# Patient Record
Sex: Female | Born: 1945
Health system: Southern US, Community
[De-identification: ages and names within clinical notes are randomized; demographics above are authoritative.]

## PROBLEM LIST (undated history)

## (undated) DIAGNOSIS — Z8719 Personal history of other diseases of the digestive system: Secondary | ICD-10-CM

## (undated) DIAGNOSIS — R202 Paresthesia of skin: Secondary | ICD-10-CM

## (undated) DIAGNOSIS — Z87442 Personal history of urinary calculi: Secondary | ICD-10-CM

## (undated) DIAGNOSIS — I10 Essential (primary) hypertension: Secondary | ICD-10-CM

## (undated) DIAGNOSIS — M0520 Rheumatoid vasculitis with rheumatoid arthritis of unspecified site: Secondary | ICD-10-CM

## (undated) DIAGNOSIS — D649 Anemia, unspecified: Secondary | ICD-10-CM

## (undated) DIAGNOSIS — G4700 Insomnia, unspecified: Secondary | ICD-10-CM

## (undated) DIAGNOSIS — H9319 Tinnitus, unspecified ear: Secondary | ICD-10-CM

## (undated) DIAGNOSIS — Z0000 Encounter for general adult medical examination without abnormal findings: Secondary | ICD-10-CM

## (undated) DIAGNOSIS — R32 Unspecified urinary incontinence: Secondary | ICD-10-CM

## (undated) DIAGNOSIS — R079 Chest pain, unspecified: Secondary | ICD-10-CM

## (undated) DIAGNOSIS — E079 Disorder of thyroid, unspecified: Secondary | ICD-10-CM

## (undated) DIAGNOSIS — Z9889 Other specified postprocedural states: Secondary | ICD-10-CM

## (undated) DIAGNOSIS — M25569 Pain in unspecified knee: Secondary | ICD-10-CM

## (undated) DIAGNOSIS — R112 Nausea with vomiting, unspecified: Secondary | ICD-10-CM

## (undated) DIAGNOSIS — E039 Hypothyroidism, unspecified: Secondary | ICD-10-CM

## (undated) DIAGNOSIS — R413 Other amnesia: Secondary | ICD-10-CM

## (undated) DIAGNOSIS — B059 Measles without complication: Secondary | ICD-10-CM

## (undated) DIAGNOSIS — J45909 Unspecified asthma, uncomplicated: Secondary | ICD-10-CM

## (undated) DIAGNOSIS — C73 Malignant neoplasm of thyroid gland: Secondary | ICD-10-CM

## (undated) DIAGNOSIS — B019 Varicella without complication: Secondary | ICD-10-CM

## (undated) DIAGNOSIS — G4733 Obstructive sleep apnea (adult) (pediatric): Secondary | ICD-10-CM

## (undated) DIAGNOSIS — T4145XA Adverse effect of unspecified anesthetic, initial encounter: Secondary | ICD-10-CM

## (undated) DIAGNOSIS — I209 Angina pectoris, unspecified: Secondary | ICD-10-CM

## (undated) DIAGNOSIS — E785 Hyperlipidemia, unspecified: Secondary | ICD-10-CM

## (undated) DIAGNOSIS — M7989 Other specified soft tissue disorders: Secondary | ICD-10-CM

## (undated) DIAGNOSIS — F419 Anxiety disorder, unspecified: Secondary | ICD-10-CM

## (undated) DIAGNOSIS — K5900 Constipation, unspecified: Secondary | ICD-10-CM

## (undated) DIAGNOSIS — L309 Dermatitis, unspecified: Secondary | ICD-10-CM

## (undated) DIAGNOSIS — K219 Gastro-esophageal reflux disease without esophagitis: Secondary | ICD-10-CM

## (undated) DIAGNOSIS — N979 Female infertility, unspecified: Secondary | ICD-10-CM

## (undated) DIAGNOSIS — R87619 Unspecified abnormal cytological findings in specimens from cervix uteri: Secondary | ICD-10-CM

## (undated) DIAGNOSIS — M1990 Unspecified osteoarthritis, unspecified site: Secondary | ICD-10-CM

## (undated) DIAGNOSIS — IMO0001 Reserved for inherently not codable concepts without codable children: Secondary | ICD-10-CM

## (undated) DIAGNOSIS — M2550 Pain in unspecified joint: Secondary | ICD-10-CM

## (undated) DIAGNOSIS — M545 Low back pain: Secondary | ICD-10-CM

## (undated) DIAGNOSIS — N200 Calculus of kidney: Secondary | ICD-10-CM

## (undated) DIAGNOSIS — R350 Frequency of micturition: Secondary | ICD-10-CM

## (undated) DIAGNOSIS — G4730 Sleep apnea, unspecified: Secondary | ICD-10-CM

## (undated) DIAGNOSIS — R002 Palpitations: Secondary | ICD-10-CM

## (undated) DIAGNOSIS — K644 Residual hemorrhoidal skin tags: Secondary | ICD-10-CM

## (undated) DIAGNOSIS — R682 Dry mouth, unspecified: Secondary | ICD-10-CM

## (undated) DIAGNOSIS — H9190 Unspecified hearing loss, unspecified ear: Secondary | ICD-10-CM

## (undated) DIAGNOSIS — N390 Urinary tract infection, site not specified: Secondary | ICD-10-CM

## (undated) DIAGNOSIS — R631 Polydipsia: Secondary | ICD-10-CM

## (undated) DIAGNOSIS — J3489 Other specified disorders of nose and nasal sinuses: Secondary | ICD-10-CM

## (undated) DIAGNOSIS — R011 Cardiac murmur, unspecified: Secondary | ICD-10-CM

## (undated) DIAGNOSIS — K5792 Diverticulitis of intestine, part unspecified, without perforation or abscess without bleeding: Secondary | ICD-10-CM

## (undated) DIAGNOSIS — E669 Obesity, unspecified: Secondary | ICD-10-CM

## (undated) DIAGNOSIS — M25512 Pain in left shoulder: Secondary | ICD-10-CM

## (undated) DIAGNOSIS — Z9989 Dependence on other enabling machines and devices: Secondary | ICD-10-CM

## (undated) HISTORY — DX: Unspecified asthma, uncomplicated: J45.909

## (undated) HISTORY — DX: Tinnitus, unspecified ear: H93.19

## (undated) HISTORY — DX: Pain in left shoulder: M25.512

## (undated) HISTORY — DX: Urinary tract infection, site not specified: N39.0

## (undated) HISTORY — DX: Gastro-esophageal reflux disease without esophagitis: K21.9

## (undated) HISTORY — DX: Encounter for general adult medical examination without abnormal findings: Z00.00

## (undated) HISTORY — DX: Dry mouth, unspecified: R68.2

## (undated) HISTORY — PX: COLON SURGERY: SHX602

## (undated) HISTORY — DX: Polydipsia: R63.1

## (undated) HISTORY — DX: Unspecified hearing loss, unspecified ear: H91.90

## (undated) HISTORY — PX: TONSILLECTOMY: SUR1361

## (undated) HISTORY — DX: Palpitations: R00.2

## (undated) HISTORY — DX: Disorder of thyroid, unspecified: E07.9

## (undated) HISTORY — DX: Unspecified abnormal cytological findings in specimens from cervix uteri: R87.619

## (undated) HISTORY — DX: Unspecified osteoarthritis, unspecified site: M19.90

## (undated) HISTORY — DX: Rheumatoid vasculitis with rheumatoid arthritis of unspecified site: M05.20

## (undated) HISTORY — DX: Paresthesia of skin: R20.2

## (undated) HISTORY — DX: Essential (primary) hypertension: I10

## (undated) HISTORY — DX: Other amnesia: R41.3

## (undated) HISTORY — DX: Other specified disorders of nose and nasal sinuses: J34.89

## (undated) HISTORY — DX: Hyperlipidemia, unspecified: E78.5

## (undated) HISTORY — DX: Measles without complication: B05.9

## (undated) HISTORY — DX: Dermatitis, unspecified: L30.9

## (undated) HISTORY — DX: Constipation, unspecified: K59.00

## (undated) HISTORY — DX: Cardiac murmur, unspecified: R01.1

## (undated) HISTORY — DX: Pain in unspecified joint: M25.50

## (undated) HISTORY — DX: Insomnia, unspecified: G47.00

## (undated) HISTORY — DX: Other specified soft tissue disorders: M79.89

## (undated) HISTORY — DX: Obesity, unspecified: E66.9

## (undated) HISTORY — DX: Female infertility, unspecified: N97.9

## (undated) HISTORY — DX: Frequency of micturition: R35.0

## (undated) HISTORY — DX: Anemia, unspecified: D64.9

## (undated) HISTORY — DX: Low back pain: M54.5

## (undated) HISTORY — DX: Pain in unspecified knee: M25.569

## (undated) HISTORY — DX: Varicella without complication: B01.9

## (undated) HISTORY — DX: Chest pain, unspecified: R07.9

## (undated) HISTORY — DX: Sleep apnea, unspecified: G47.30

## (undated) HISTORY — DX: Diverticulitis of intestine, part unspecified, without perforation or abscess without bleeding: K57.92

## (undated) SURGERY — Surgical Case
Anesthesia: *Unknown

---
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
## 1969-08-13 DIAGNOSIS — N200 Calculus of kidney: Secondary | ICD-10-CM

## 1969-08-13 HISTORY — DX: Calculus of kidney: N20.0

## 1969-08-13 HISTORY — PX: VAGINAL HYSTERECTOMY: SUR661

## 1979-08-14 DIAGNOSIS — C73 Malignant neoplasm of thyroid gland: Secondary | ICD-10-CM

## 1979-08-14 HISTORY — DX: Malignant neoplasm of thyroid gland: C73

## 1986-12-13 HISTORY — PX: THYROIDECTOMY, PARTIAL: SHX18

## 1988-12-13 HISTORY — PX: CHOLECYSTECTOMY: SHX55

## 2001-04-17 ENCOUNTER — Other Ambulatory Visit: Admission: RE | Admit: 2001-04-17 | Discharge: 2001-04-17 | Payer: Self-pay | Admitting: *Deleted

## 2002-10-18 ENCOUNTER — Encounter: Admission: RE | Admit: 2002-10-18 | Discharge: 2002-10-18 | Payer: Self-pay | Admitting: Family Medicine

## 2002-10-18 ENCOUNTER — Encounter: Payer: Self-pay | Admitting: Family Medicine

## 2002-12-13 LAB — HM COLONOSCOPY: HM Colonoscopy: NORMAL

## 2003-11-30 ENCOUNTER — Emergency Department (HOSPITAL_COMMUNITY): Admission: EM | Admit: 2003-11-30 | Discharge: 2003-12-01 | Payer: Self-pay | Admitting: Emergency Medicine

## 2005-04-05 ENCOUNTER — Ambulatory Visit (HOSPITAL_COMMUNITY): Admission: RE | Admit: 2005-04-05 | Discharge: 2005-04-05 | Payer: Self-pay | Admitting: Gastroenterology

## 2008-01-01 ENCOUNTER — Encounter: Admission: RE | Admit: 2008-01-01 | Discharge: 2008-01-01 | Payer: Self-pay | Admitting: Orthopedic Surgery

## 2008-02-08 ENCOUNTER — Ambulatory Visit (HOSPITAL_BASED_OUTPATIENT_CLINIC_OR_DEPARTMENT_OTHER): Admission: RE | Admit: 2008-02-08 | Discharge: 2008-02-08 | Payer: Self-pay | Admitting: Orthopedic Surgery

## 2010-06-29 ENCOUNTER — Encounter: Payer: Self-pay | Admitting: Internal Medicine

## 2010-07-15 ENCOUNTER — Encounter: Payer: Self-pay | Admitting: Internal Medicine

## 2010-08-25 ENCOUNTER — Ambulatory Visit (HOSPITAL_BASED_OUTPATIENT_CLINIC_OR_DEPARTMENT_OTHER): Admission: RE | Admit: 2010-08-25 | Discharge: 2010-08-25 | Payer: Self-pay | Admitting: Family Medicine

## 2010-08-25 ENCOUNTER — Encounter: Payer: Self-pay | Admitting: Internal Medicine

## 2010-08-30 ENCOUNTER — Ambulatory Visit: Payer: Self-pay | Admitting: Internal Medicine

## 2010-11-04 ENCOUNTER — Ambulatory Visit: Payer: Self-pay | Admitting: Internal Medicine

## 2010-11-04 DIAGNOSIS — G4733 Obstructive sleep apnea (adult) (pediatric): Secondary | ICD-10-CM | POA: Insufficient documentation

## 2010-11-04 DIAGNOSIS — J309 Allergic rhinitis, unspecified: Secondary | ICD-10-CM | POA: Insufficient documentation

## 2010-11-04 HISTORY — DX: Obstructive sleep apnea (adult) (pediatric): G47.33

## 2010-11-04 HISTORY — DX: Allergic rhinitis, unspecified: J30.9

## 2010-11-08 DIAGNOSIS — E782 Mixed hyperlipidemia: Secondary | ICD-10-CM | POA: Insufficient documentation

## 2010-11-08 DIAGNOSIS — I10 Essential (primary) hypertension: Secondary | ICD-10-CM | POA: Insufficient documentation

## 2010-11-08 DIAGNOSIS — K219 Gastro-esophageal reflux disease without esophagitis: Secondary | ICD-10-CM | POA: Insufficient documentation

## 2010-11-08 HISTORY — DX: Essential (primary) hypertension: I10

## 2010-11-16 ENCOUNTER — Telehealth (INDEPENDENT_AMBULATORY_CARE_PROVIDER_SITE_OTHER): Payer: Self-pay | Admitting: *Deleted

## 2010-11-19 ENCOUNTER — Encounter: Payer: Self-pay | Admitting: Internal Medicine

## 2010-12-02 ENCOUNTER — Telehealth: Payer: Self-pay | Admitting: Internal Medicine

## 2010-12-11 ENCOUNTER — Ambulatory Visit: Payer: Self-pay | Admitting: Internal Medicine

## 2010-12-17 ENCOUNTER — Encounter: Payer: Self-pay | Admitting: Internal Medicine

## 2010-12-18 ENCOUNTER — Telehealth (INDEPENDENT_AMBULATORY_CARE_PROVIDER_SITE_OTHER): Payer: Self-pay | Admitting: *Deleted

## 2011-01-05 ENCOUNTER — Encounter: Payer: Self-pay | Admitting: Internal Medicine

## 2011-01-14 NOTE — Letter (Signed)
Summary: Jackie Stevens at [facility] at [facility]   Imported By: Lester [place] 11/20/2010 10:44:21  _____________________________________________________________________  External Attachment:    Type:   Image     Comment:   External Document

## 2011-01-14 NOTE — Assessment & Plan Note (Signed)
Summary: sleep apnea need cpap machine/jd   Primary Provider/Referring Provider:  Blair Heys  CC:  Sleep New Patient.  History of Present Illness: November 04, 2010- 64 yoF referred by Dr Manus Gunning for sleep evaluation. She complains that her snoring wakes her up. Her husband Deniece Portela is our pateient with sleep apnea, on CPAP, so she is aware of these issues. She wakes 2-3 x/ night, w/ nocturia x 1. Some daytime sleepiness. Husband wore ear plugs for awhile, but they disturb each other and are now in separate rooms.  Bedtime 1030-1200 MN, latency 10 minutes, up 530- 630 AM.  [facility] NPSGat [facility] 07/15/10- AHI 13.5/hr. CPAP titration at [facility] facility 08/25/10- CPAP 10> AHI 0/hr. Hx GERD and cough wakes her if she forgets Reglan. Past hx asthma and now rhinitis w/ much sinus drip, ears itch. She denies dyspnea lying awake in bed.  Hx Thyroid cancder, partial thyroidectomy, RAI.   Preventive Screening-Counseling & Management  Alcohol-Tobacco     Smoking Status: never  Current Medications (verified): 1)  Diovan Hct 160-12.5 Mg Tabs (Valsartan-Hydrochlorothiazide) .... Take 1 By Mouth Once Daily 2)  Simvastatin 40 Mg Tabs (Simvastatin) .... Take 1 By Mouth Once Daily 3)  Synthroid 150 Mcg Tabs (Levothyroxine Sodium) .... Take 1 By Mouth Once Daily 4)  Etodolac 400 Mg Tabs (Etodolac) .... Take 2 By Mouth Once Daily 5)  Vitamin D3 1000 Unit Tabs (Cholecalciferol) .... Take 1 By Mouth Once Daily 6)  Vitamin E 400 Unit Caps (Vitamin E) .... Take 1 By Mouth Once Daily 7)  Metoclopramide Hcl 10 Mg Tabs (Metoclopramide Hcl) .... Take 1 By Mouth At Each Meal and Bedtime 8)  Calcium Carbonate 600 Mg Tabs (Calcium Carbonate) .... Take 2 By Mouth Once Daily 9)  Fish Oil 1200 Mg Caps (Omega-3 Fatty Acids) .... Take 1 By Mouth Once Daily 10)  Glucosamine 1500 Complex  Caps (Glucosamine-Chondroit-Vit C-Mn) .... Take 1 By Mouth Once Daily 11)  Vitamin B-12 1000 Mcg Tabs (Cyanocobalamin) .... Taek 1 By  Mouth Once Daily 12)  Metamucil Plus Calcium  Caps (Psyllium-Calcium) .... Take 1 By Mouth Once Daily  Allergies (verified): 1)  ! Neosporin  Past History:  Family History: Last updated: 11/04/2010 Heart DX: mother and father Cancer: aunts on fathers side. Mother- died Alzheimer's, heart disease Father- died pneumonia age 30  Social History: Last updated: 11/04/2010 Married with children Retired Never smoked ETOH-2 glasses a week  Risk Factors: Smoking Status: never (11/04/2010)  Past Medical History: Allergic Rhinitis Hypertension Obstructive sleep apnea- NPSG 07/15/10- AHI 13.5/ hr @ [facility]. CPAP. G E R D Hx thyroid cancer- partial thyroidectomy, RAI Hyperlipidemia  Past Surgical History: Partial thyroidectomy Tonsillectomy Cholecystectomy Total Abdominal Hysterectomy  Family History: Heart DX: mother and father Cancer: aunts on fathers side. Mother- died Alzheimer's, heart disease Father- died pneumonia age 12  Social History: Married with children Retired Never smoked ETOH-2 glasses a weekSmoking Status:  never  Review of Systems       The patient complains of shortness of breath with activity, non-productive cough, itching, depression, and joint stiffness or pain.  The patient denies shortness of breath at rest, productive cough, coughing up blood, chest pain, irregular heartbeats, acid heartburn, indigestion, loss of appetite, weight change, abdominal pain, difficulty swallowing, sore throat, tooth/dental problems, headaches, nasal congestion/difficulty breathing through nose, sneezing, ear ache, anxiety, hand/feet swelling, rash, change in color of mucus, and fever.    Vital Signs:  Patient profile:   65 year old female Height:  63 inches Weight:      217 pounds BMI:     38.58 O2 Sat:      96 % on Room air Pulse rate:   75 / minute BP sitting:   122 / 78  (left arm) Cuff size:   large  Vitals Entered By: Reynaldo Minium CMA (November 04, 2010  10:42 AM)  O2 Flow:  Room air  Physical Exam  Additional Exam:  General: A/Ox3; pleasant and cooperative, NAD,overweight SKIN: no rash, lesions NODES: no lymphadenopathy HEENT: [place]/AT, EOM- WNL, Conjuctivae- clear, PERRLA, TM- irritated/ abrasion Right ear, Nose- clear, Throat- clear and wnl, Mallampati  IV NECK: Supple w/ fair ROM, JVD- none,  Thyroid-  no mass. Carotid artery- short bruit right  CHEST: Clear to P&A HEART: RRR, no m/g/r heard ABDOMEN: Soft and nl; nml bowel sounds; no organomegaly or masses noted ZOX:WRUE, nl pulses, no edema  NEURO: Grossly intact to observation      Impression & Recommendations:  Problem # 1:  OBSTRUCTIVE SLEEP APNEA (ICD-327.23)  Mild  sleep apnea with excellent initial response to CPAP at 10. We carefully discussed the medical concerns and available treatments. Emphasized the importance of weight, and her responsibility to drive safely.  Problem # 2:  ALLERGIC RHINITIS (ICD-477.9)  She has substantial itching in ear canals and rhintis. We will have her try Nasonex intially but she may need to suplement with an antihistamine.   Her updated medication list for this problem includes:    Nasonex 50 Mcg/act Susp (Mometasone furoate) .Marland Kitchen... 1-2 puffs each nostril every night at bedtime  Medications Added to Medication List This Visit: 1)  Diovan Hct 160-12.5 Mg Tabs (Valsartan-hydrochlorothiazide) .... Take 1 by mouth once daily 2)  Simvastatin 40 Mg Tabs (Simvastatin) .... Take 1 by mouth once daily 3)  Synthroid 150 Mcg Tabs (Levothyroxine sodium) .... Take 1 by mouth once daily 4)  Etodolac 400 Mg Tabs (Etodolac) .... Take 2 by mouth once daily 5)  Vitamin D3 1000 Unit Tabs (Cholecalciferol) .... Take 1 by mouth once daily 6)  Vitamin E 400 Unit Caps (Vitamin e) .... Take 1 by mouth once daily 7)  Metoclopramide Hcl 10 Mg Tabs (Metoclopramide hcl) .... Take 1 by mouth at each meal and bedtime 8)  Calcium Carbonate 600 Mg Tabs (Calcium  carbonate) .... Take 2 by mouth once daily 9)  Fish Oil 1200 Mg Caps (Omega-3 fatty acids) .... Take 1 by mouth once daily 10)  Glucosamine 1500 Complex Caps (Glucosamine-chondroit-vit c-mn) .... Take 1 by mouth once daily 11)  Vitamin B-12 1000 Mcg Tabs (Cyanocobalamin) .... Taek 1 by mouth once daily 12)  Metamucil Plus Calcium Caps (Psyllium-calcium) .... Take 1 by mouth once daily 13)  Cpap 10  14)  Nasonex 50 Mcg/act Susp (Mometasone furoate) .Marland Kitchen.. 1-2 puffs each nostril every night at bedtime  Other Orders: Consultation Level IV (45409) DME Referral (DME)  Patient Instructions: 1)  Please schedule a follow-up appointment in 1 month. 2)  See PCC to start CPAP at 10 3)  Sample and script for nasonex nasal steroid inhaler 4)     1-2 puffs each nostril every night at bedtime.  5)  cc Dr Manus Gunning Prescriptions: NASONEX 50 MCG/ACT SUSP (MOMETASONE FUROATE) 1-2 puffs each nostril every night at bedtime  #1 x prn   Entered and Authorized by:   Waymon Budge MD   Signed by:   Waymon Budge MD on 11/04/2010   Method used:   Print then  Give to Patient   RxID:   [id number]

## 2011-01-14 NOTE — Progress Notes (Signed)
Summary: [facility] QUESTION  Phone Note Call from Patient Call back at Home Phone [phone] Call back at [phone]   Caller: Patient Call For: [name] Summary of Call: [facility] CALLED HER AND TOLD HER THE APPT WOULDNT BE PAID FOR CAUSE SHE HADNT HAD HER CPAP FOR A MONTH Initial call taken by: Lacinda Axon,  December 18, 2010 8:29 AM  Follow-up for Phone Call        I called [facility] about this issue and had to leave a message for someone in billing to look into this on 12-18-10; [facility] called back on 12-21-10 I was not in the office on this day. I called and spoke with Joy at [facility] today and she found out that no billing issues at this time and pt has ROV with [facility] on 01-25-11 and this will cover the time frame in which patient needs to be seen for CPAP compliance.    Pt aware of the above.Reynaldo Minium CMA  December 23, 2010 12:37 PM

## 2011-01-14 NOTE — Progress Notes (Signed)
Summary: cpap has not been received  Phone Note Call from Patient Call back at Home Phone [phone] Call back at [phone]   Caller: Patient Call For: young Summary of Call: pt still hasnt gotten her cpap yet  Initial call taken by: Lacinda Axon,  November 16, 2010 12:04 PM  Follow-up for Phone Call        Looks like the order for this was sent to [facility] on 11/04/10- will forward to pcc, thanks Follow-up by: Vernie Murders,  November 16, 2010 12:08 PM  Additional Follow-up for Phone Call Additional follow up Details #1::        LMOAM for Jackie Stevens to return my call in reference to status of CPAP order. Jackie Stevens  November 16, 2010 12:16 PM Jackie Stevens returned call and stated that they are trying to obtain records from Dr. Randel Books office. Jackie Stevens  November 16, 2010 12:51 PM     Additional Follow-up for Phone Call Additional follow up Details #2::    Pt is aware [facility] trying to obtain recs from Dr. Randel Books office and says she will call that office to see if they have sent the recs [facility] is needing.Jackie Stevens [facility]  November 16, 2010 1:43 PM

## 2011-01-14 NOTE — Assessment & Plan Note (Signed)
Summary: 5 WEEK RETURN/[facility]   Primary Wynee Matarazzo/Referring Piercen Covino:  Blair Heys  CC:  5 week follow up visit-using CPAP machine every night; feels like to much pressure at times..  History of Present Illness:  History of Present Illness: November 04, 2010- 65 yoF referred by Dr Manus Gunning for sleep evaluation. She complains that her snoring wakes her up. Her husband Deniece Portela is our pateient with sleep apnea, on CPAP, so she is aware of these issues. She wakes 2-3 x/ night, w/ nocturia x 1. Some daytime sleepiness. Husband wore ear plugs for awhile, but they disturb each other and are now in separate rooms.  Bedtime 1030-1200 MN, latency 10 minutes, up 530- 630 AM.  Diagnostic NPSGat [facility] 07/15/10- AHI 13.5/hr. CPAP titration at [facility] facility 08/25/10- CPAP 10> AHI 0/hr. Hx GERD and cough wakes her if she forgets Reglan. Past hx asthma and now rhinitis w/ much sinus drip, ears itch. She denies dyspnea lying awake in bed.  Hx Thyroid cancer, partial thyroidectomy, RAI.   December 11, 2010- OSA, GERD.....Marland Kitchengrandaughter here Nurse-CC: 5 week follow up visit-using CPAP machine every night; feels like too much pressure at times. She likes the cpap and is using it every night. Bothersome leak at times. No longer wakes herself snore. Discussed mask fit and pressure.     Preventive Screening-Counseling & Management  Alcohol-Tobacco     Smoking Status: never  Current Medications (verified): 1)  Diovan Hct 160-12.5 Mg Tabs (Valsartan-Hydrochlorothiazide) .... Take 1 By Mouth Once Daily 2)  Simvastatin 40 Mg Tabs (Simvastatin) .... Take 1 By Mouth Once Daily 3)  Synthroid 150 Mcg Tabs (Levothyroxine Sodium) .... Take 1 By Mouth Once Daily 4)  Etodolac 400 Mg Tabs (Etodolac) .... Take 2 By Mouth Once Daily 5)  Vitamin D3 1000 Unit Tabs (Cholecalciferol) .... Take 1 By Mouth Once Daily 6)  Vitamin E 400 Unit Caps (Vitamin E) .... Take 1 By Mouth Once Daily 7)  Metoclopramide Hcl 10 Mg Tabs  (Metoclopramide Hcl) .... Take 1 By Mouth At Each Meal and Bedtime 8)  Calcium Carbonate 600 Mg Tabs (Calcium Carbonate) .... Take 2 By Mouth Once Daily 9)  Fish Oil 1200 Mg Caps (Omega-3 Fatty Acids) .... Take 1 By Mouth Once Daily 10)  Glucosamine 1500 Complex  Caps (Glucosamine-Chondroit-Vit C-Mn) .... Take 1 By Mouth Once Daily 11)  Vitamin B-12 1000 Mcg Tabs (Cyanocobalamin) .... Taek 1 By Mouth Once Daily 12)  Metamucil Plus Calcium  Caps (Psyllium-Calcium) .... Take 1 By Mouth Once Daily 13)  Cpap 10 14)  Nasonex 50 Mcg/act Susp (Mometasone Furoate) .Marland Kitchen.. 1-2 Puffs Each Nostril Every Night At Bedtime  Allergies (verified): 1)  ! Neosporin 2)  ! * Niaspan  Past History:  Past Medical History: Last updated: 11/04/2010 Allergic Rhinitis Hypertension Obstructive sleep apnea- NPSG 07/15/10- AHI 13.5/ hr @ [facility]. CPAP. G E R D Hx thyroid cancer- partial thyroidectomy, RAI Hyperlipidemia  Past Surgical History: Last updated: 11/04/2010 Partial thyroidectomy Tonsillectomy Cholecystectomy Total Abdominal Hysterectomy  Family History: Last updated: 11/04/2010 Heart DX: mother and father Cancer: aunts on fathers side. Mother- died Alzheimer's, heart disease Father- died pneumonia age 47  Social History: Last updated: 11/04/2010 Married with children Retired Never smoked ETOH-2 glasses a week  Risk Factors: Smoking Status: never (12/11/2010)  Review of Systems      See HPI  The patient denies anorexia, fever, weight loss, weight gain, vision loss, decreased hearing, hoarseness, chest pain, syncope, dyspnea on exertion, peripheral edema, prolonged cough, headaches, hemoptysis, abdominal  pain, abnormal bleeding, and enlarged lymph nodes.    Vital Signs:  Patient profile:   65 year old female Height:      63 inches Weight:      223.38 pounds BMI:     39.71 O2 Sat:      97 % on Room air Pulse rate:   70 / minute BP sitting:   128 / 70  (left arm) Cuff size:    large  Vitals Entered By: Reynaldo Minium CMA (December 11, 2010 11:04 AM)  O2 Flow:  Room air CC: 5 week follow up visit-using CPAP machine every night; feels like to much pressure at times.   Physical Exam  Additional Exam:  General: A/Ox3; pleasant and cooperative, NAD,overweight SKIN: no rash, lesions NODES: no lymphadenopathy HEENT: [place]/AT, EOM- WNL, Conjuctivae- clear, PERRLA, TM- irritated/ abrasion Right ear, Nose- clear, Throat- clear and wnl, Mallampati  IV NECK: Supple w/ fair ROM, JVD- none,  Thyroid-  no mass. Carotid artery- short bruit right  CHEST: Clear to P&A HEART: RRR, no m/g/r heard ABDOMEN: overweight ZOX:WRUE, nl pulses, no edema  NEURO: Grossly intact to observation      Impression & Recommendations:  Problem # 1:  OBSTRUCTIVE SLEEP APNEA (ICD-327.23) I think she can keep her current mask, at least till next routine change date. Sleep hygiene is good.  We will try reducing her CPAP to 9 to reduce leak as discussed.   Other Orders: Est. Patient Level III (45409) DME Referral (DME)  Patient Instructions: 1)  Please schedule a follow-up appointment in 4 months. 2)  We will contact [facility] for them to reduce the CPAP pressure to 9. If it is uncomfortable, or you have any questions or concerns, please call.

## 2011-01-14 NOTE — Progress Notes (Signed)
Summary: appt question  Phone Note Call from Patient Call back at Home Phone [phone]   Caller: Patient Call For: young Summary of Call: Pt states she just received her cpap machine on 12/8 and wants to know if she should keep her appt on 12/30 pls advise. Initial call taken by: Darletta Moll,  December 02, 2010 12:27 PM  Follow-up for Phone Call        advised pt to keep appt on 12-11-10. Carron Curie CMA  December 02, 2010 3:25 PM

## 2011-01-20 NOTE — Letter (Signed)
Summary: CMN for CPAP Supplies/[facility]  CMN for CPAP Supplies/[facility]   Imported By: Sherian Rein 01/11/2011 11:11:55  _____________________________________________________________________  External Attachment:    Type:   Image     Comment:   External Document

## 2011-01-20 NOTE — Letter (Signed)
Summary: CMN for CPAP Supplies/[facility]  CMN for CPAP Supplies/[facility]   Imported By: Sherian Rein 01/11/2011 11:01:55  _____________________________________________________________________  External Attachment:    Type:   Image     Comment:   External Document

## 2011-01-25 ENCOUNTER — Encounter: Payer: Self-pay | Admitting: Internal Medicine

## 2011-01-25 ENCOUNTER — Ambulatory Visit (INDEPENDENT_AMBULATORY_CARE_PROVIDER_SITE_OTHER): Payer: Medicare Other | Admitting: Internal Medicine

## 2011-01-25 DIAGNOSIS — G4733 Obstructive sleep apnea (adult) (pediatric): Secondary | ICD-10-CM

## 2011-01-26 ENCOUNTER — Encounter: Payer: Self-pay | Admitting: Internal Medicine

## 2011-02-03 NOTE — Assessment & Plan Note (Signed)
Summary: rov/jd   Primary Provider/Referring Provider:  Blair Heys  CC:  Follow up after starting cpap.  Marland Kitchen  History of Present Illness: History of Present Illness: November 04, 2010- 65 yoF referred by Dr Manus Gunning for sleep evaluation. She complains that her snoring wakes her up. Her husband Deniece Portela is our pateient with sleep apnea, on CPAP, so she is aware of these issues. She wakes 2-3 x/ night, w/ nocturia x 1. Some daytime sleepiness. Husband wore ear plugs for awhile, but they disturb each other and are now in separate rooms.  Bedtime 1030-1200 MN, latency 10 minutes, up 530- 630 AM.  [facility]at [facility] 07/15/10- AHI 13.5/hr. CPAP titration at [facility] facility 08/25/10- CPAP 10> AHI 0/hr. Hx GERD and cough wakes her if she forgets Reglan. Past hx asthma and now rhinitis w/ much sinus drip, ears itch. She denies dyspnea lying awake in bed.  Hx Thyroid cancer, partial thyroidectomy, RAI.   December 11, 2010- OSA, GERD.....Marland Kitchengrandaughter here Nurse-CC: 5 week follow up visit-using CPAP machine every night; feels like too much pressure at times. She likes the cpap and is using it every night. Bothersome leak at times. No longer wakes herself snore. Discussed mask fit and pressure.   January 25, 2011-  OSA, GERD.....Marland Kitchengrandaughter here Nurse-CC: Follow up after starting cpap.   CPAP 10- nasal pillows sometimes come out and the noise wakes her,  but otherwise doing ok. Usually able to sleep all night with it. Not snoring through it. She got a letter from [employer] that needs attention.    Preventive Screening-Counseling & Management  Alcohol-Tobacco     Smoking Status: never  Current Medications (verified): 1)  Diovan Hct 160-12.5 Mg Tabs (Valsartan-Hydrochlorothiazide) .... Take 1 By Mouth Once Daily 2)  Simvastatin 40 Mg Tabs (Simvastatin) .... Take 1 By Mouth Once Daily 3)  Synthroid 150 Mcg Tabs (Levothyroxine Sodium) .... Take 1 By Mouth Once Daily 4)  Etodolac 400 Mg  Tabs (Etodolac) .... Take 1 Tablet By Mouth Two Times A Day 5)  Vitamin D3 1000 Unit Tabs (Cholecalciferol) .... Take 1 By Mouth Once Daily 6)  Vitamin E 400 Unit Caps (Vitamin E) .... Take 1 By Mouth Once Daily 7)  Metoclopramide Hcl 10 Mg Tabs (Metoclopramide Hcl) .... Take 1 By Mouth At Each Meal and Bedtime 8)  Calcium Carbonate 600 Mg Tabs (Calcium Carbonate) .... Take 2 By Mouth Once Daily 9)  Fish Oil 1200 Mg Caps (Omega-3 Fatty Acids) .... Take 1 By Mouth Once Daily 10)  Glucosamine 1500 Complex  Caps (Glucosamine-Chondroit-Vit C-Mn) .... Take 1 By Mouth Once Daily 11)  Vitamin B-12 1000 Mcg Tabs (Cyanocobalamin) .... Taek 1 By Mouth Once Daily 12)  Metamucil Plus Calcium  Caps (Psyllium-Calcium) .... Take 1 By Mouth Once Daily 13)  Cpap 10 14)  Minocycline Hcl 100 Mg Caps (Minocycline Hcl) .... Take 1 Capsule By Mouth Once A Day  Allergies (verified): 1)  ! Neosporin 2)  ! * Niaspan  Past History:  Past Medical History: Last updated: 11/04/2010 Allergic Rhinitis Hypertension Obstructive sleep apnea- NPSG 07/15/10- AHI 13.5/ hr @ [facility]. CPAP. G E R D Hx thyroid cancer- partial thyroidectomy, RAI Hyperlipidemia  Past Surgical History: Last updated: 11/04/2010 Partial thyroidectomy Tonsillectomy Cholecystectomy Total Abdominal Hysterectomy  Family History: Last updated: 11/04/2010 Heart DX: mother and father Cancer: aunts on fathers side. Mother- died Alzheimer's, heart disease Father- died pneumonia age 65  Social History: Last updated: 11/04/2010 Married with children Retired Never smoked ETOH-2 glasses a  week  Risk Factors: Smoking Status: never (01/25/2011)  Review of Systems      See HPI  The patient denies anorexia, fever, weight loss, weight gain, vision loss, decreased hearing, hoarseness, chest pain, syncope, dyspnea on exertion, peripheral edema, prolonged cough, headaches, hemoptysis, abdominal pain, and severe indigestion/heartburn.    Vital  Signs:  Patient profile:   65 year old female Height:      62 inches Weight:      222.38 pounds BMI:     40.82 O2 Sat:      97 % on Room air Pulse rate:   77 / minute BP sitting:   106 / 64  (right arm) Cuff size:   large  Vitals Entered By: Gweneth Dimitri RN (January 25, 2011 4:00 PM)  O2 Flow:  Room air CC: Follow up after starting cpap.   Comments Medications reviewed with patient Daytime contact number verified with patient. Gweneth Dimitri RN  January 25, 2011 4:01 PM    Physical Exam  Additional Exam:  General: A/Ox3; pleasant and cooperative, NAD,overweight SKIN: no rash, lesions NODES: no lymphadenopathy HEENT: [place]/AT, EOM- WNL, Conjuctivae- clear, PERRLA, TM- irritated/ abrasion Right ear, Nose- clear, Throat- clear and wnl, Mallampati  IV NECK: Supple w/ fair ROM, JVD- none,  Thyroid-  no mass. Carotid artery- short bruit right  CHEST: Clear to P&A HEART: RRR, no m/g/r heard ABDOMEN: overweight WUJ:[id number], nl pulses, no edema  NEURO: Grossly intact to observation, comfortably alert      Impression & Recommendations:  Problem # 1:  OBSTRUCTIVE SLEEP APNEA (ICD-327.23)  Good compliance and control. We discussed dealing with occasional leak when mask shifts, but pressure seems good.  Weight loss is encouraged.   Problem # 2:  ALLERGIC RHINITIS (ICD-477.9) Seasonal rhinitis is not yet a problem, although pollen counts are rising.  The following medications were removed from the medication list:    Nasonex 50 Mcg/act Susp (Mometasone furoate) .Marland Kitchen... 1-2 puffs each nostril every night at bedtime  Medications Added to Medication List This Visit: 1)  Etodolac 400 Mg Tabs (Etodolac) .... Take 1 tablet by mouth two times a day 2)  Cpap 10 [facility]  3)  Minocycline Hcl 100 Mg Caps (Minocycline hcl) .... Take 1 capsule by mouth once a day  Other Orders: Est. Patient Level III (14782)  Patient Instructions: 1)  Please schedule a follow-up appointment in 6  months. 2)  Continue CPAP at 10. Please call if you find you are having problems.

## 2011-04-12 ENCOUNTER — Ambulatory Visit: Payer: Self-pay | Admitting: Internal Medicine

## 2011-04-23 ENCOUNTER — Encounter: Payer: Self-pay | Admitting: Internal Medicine

## 2011-04-27 NOTE — Op Note (Signed)
NAMEBRYANA, Jackie Stevens                ACCOUNT NO.:  [id number]   MEDICAL RECORD NO.:  [id number]          PATIENT TYPE:  AMB   LOCATION:  [facility]                         FACILITY:  [facility]   PHYSICIAN:  Ollen Gross, M.D.    DATE OF BIRTH:  01-12-1946   DATE OF PROCEDURE:  02/08/2008  DATE OF DISCHARGE:                               OPERATIVE REPORT   PREOPERATIVE DIAGNOSIS:  Left knee medial and lateral meniscal tears and  chondral defect.   POSTOPERATIVE DIAGNOSIS:  Left knee medial and lateral meniscal tears  and chondral defect.   PROCEDURE:  Left knee arthroscopy with medial and lateral meniscal  debridement and chondroplasty.   SURGEON:  Ollen Gross, M.D.   ASSISTANT:  None.   ANESTHESIA:  General.   ESTIMATED BLOOD LOSS:  Minimal.   DRAINS:  None.   COMPLICATIONS:  None.   CONDITION:  Stable to recovery.   CLINICAL NOTE:  [name] is a 65 year old female with a several month  history of progressively worsening left knee pain and dysfunction.  On  exam history is consistent with a medial meniscal tear confirmed by MRI.  In addition she had a lateral tear.  She presents now for arthroscopy  and debridement.   PROCEDURE IN DETAIL:  After successful administration of general  anesthetic, tourniquet was placed high on the left thigh, left lower  extremity prepped and draped in the usual sterile fashion.  Standard  superomedial and inferolateral incisions were made.  In flow cannula  passed, superomedial camera passed, inferolateral.  Arthroscopic  visualization proceeds.  On the undersurface of the patella she is  normal.  The trochlea has grade II change but no full thickness chondral  defects.  Medial and lateral gutters were visualized and there were no  loose bodies.  Flexion and valgus force was applied to the knee and the  medial compartment was entered.  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  There  is a bad tear body and posterior horn of the medial meniscus with  significant degeneration in the posterior horn.  A spinal needle was  used to localize the inferomedial portal, small incision made, dilator  passed.  The meniscus was debrided back to stable base with baskets and  a 4.2 mm shaver and then sealed off with the ArthroCare device.  The  unstable cartilage on the surface of the femur was debrided back to a  stable cartilaginous base.  In that 1 x 2 cm area it is very thin but  the bone is not exposed.  The rest of the condyle looks to be minimally  involved.  The intercondylar notch was then visualized.  The ACL was  normal.  The lateral compartment was entered and there is a lateral  meniscal tear.  The chondral surfaces looked fairly normal.  The  meniscus was debrided back to a stable base with the baskets and the  shaver and sealed off with the ArthroCare.  The rest of the joint is  once again  inspected and there are no further tears, loose bodies or  cartilage defects.  The arthroscopic equipment was removed from the  inferior portals which were closed with  interrupted 4-0 nylon.  Twenty mL of quarter percent Marcaine with epi  injected through the inflow cannula then that is removed then that  portal was closed with nylon.  Bulky sterile dressing is applied and she  is awakened and transported to recovery in stable condition.      Ollen Gross, M.D.  Electronically Signed     FA/MEDQ  D:  02/08/2008  T:  02/09/2008  Job:  [id number]

## 2011-04-30 NOTE — Op Note (Signed)
NAMECORDIE, BEAZLEY                ACCOUNT NO.:  [id number]   MEDICAL RECORD NO.:  [id number]          PATIENT TYPE:  AMB   LOCATION:  ENDO                         FACILITY:  [facility]   PHYSICIAN:  Graylin Shiver, M.D.   DATE OF BIRTH:  14-Feb-1946   DATE OF PROCEDURE:  04/05/2005  DATE OF DISCHARGE:                                 OPERATIVE REPORT   PROCEDURE:  Colonoscopy.   INDICATIONS FOR PROCEDURE:  Rectal bleeding.   Informed consent was obtained after explanation of the risks of bleeding,  infection and perforation.   PREMEDICATION:  Fentanyl 75 mcg IV, Versed 9 mg IV.   DESCRIPTION OF PROCEDURE:  With the patient in the left lateral decubitus  position, a rectal exam was performed, no masses were felt. The Olympus  colonoscope was inserted into the rectum and advanced around a very tortuous  colon to the cecum. Cecal landmarks were identified. The cecum and ascending  colon were normal. The transverse colon was normal.  The descending colon  and sigmoid showed a few scattered diverticula. The rectum looked normal.  The scope was brought out, there were some small hemorrhoids seen. She  tolerated the procedure well without complications.   IMPRESSION:  1.  Diverticulosis.  2.  Hemoglobin.   I would recommend a followup screening colonoscopy again in 10 years.      SFG/MEDQ  D:  04/05/2005  T:  04/05/2005  Job:  [id number]   cc:   Bryan Lemma. Manus Gunning, M.D.  [address] [place]  [place] [address]  Fax: [phone]

## 2011-05-31 ENCOUNTER — Encounter: Payer: Self-pay | Admitting: Internal Medicine

## 2011-07-18 ENCOUNTER — Observation Stay (HOSPITAL_COMMUNITY)
Admission: EM | Admit: 2011-07-18 | Discharge: 2011-07-19 | DRG: 313 | Disposition: A | Discharge status: Home / Self Care | Payer: Medicare Other | Attending: Cardiology | Admitting: Cardiology

## 2011-07-18 ENCOUNTER — Emergency Department (HOSPITAL_COMMUNITY): Payer: Medicare Other

## 2011-07-18 DIAGNOSIS — L719 Rosacea, unspecified: Secondary | ICD-10-CM | POA: Insufficient documentation

## 2011-07-18 DIAGNOSIS — Z23 Encounter for immunization: Secondary | ICD-10-CM | POA: Insufficient documentation

## 2011-07-18 DIAGNOSIS — E785 Hyperlipidemia, unspecified: Secondary | ICD-10-CM | POA: Insufficient documentation

## 2011-07-18 DIAGNOSIS — G4733 Obstructive sleep apnea (adult) (pediatric): Secondary | ICD-10-CM | POA: Insufficient documentation

## 2011-07-18 DIAGNOSIS — I10 Essential (primary) hypertension: Secondary | ICD-10-CM | POA: Insufficient documentation

## 2011-07-18 DIAGNOSIS — K449 Diaphragmatic hernia without obstruction or gangrene: Secondary | ICD-10-CM | POA: Insufficient documentation

## 2011-07-18 DIAGNOSIS — R0609 Other forms of dyspnea: Secondary | ICD-10-CM | POA: Insufficient documentation

## 2011-07-18 DIAGNOSIS — R0989 Other specified symptoms and signs involving the circulatory and respiratory systems: Secondary | ICD-10-CM | POA: Insufficient documentation

## 2011-07-18 DIAGNOSIS — E039 Hypothyroidism, unspecified: Secondary | ICD-10-CM | POA: Insufficient documentation

## 2011-07-18 DIAGNOSIS — R079 Chest pain, unspecified: Secondary | ICD-10-CM

## 2011-07-18 DIAGNOSIS — K219 Gastro-esophageal reflux disease without esophagitis: Secondary | ICD-10-CM | POA: Insufficient documentation

## 2011-07-18 DIAGNOSIS — Z85850 Personal history of malignant neoplasm of thyroid: Secondary | ICD-10-CM | POA: Insufficient documentation

## 2011-07-18 DIAGNOSIS — K5730 Diverticulosis of large intestine without perforation or abscess without bleeding: Secondary | ICD-10-CM | POA: Insufficient documentation

## 2011-07-18 LAB — POCT I-STAT, CHEM 8
BUN: 16 mg/dL (ref 6–23)
Calcium, Ion: 1.08 mmol/L — ABNORMAL LOW (ref 1.12–1.32)
Chloride: 105 mEq/L (ref 96–112)
Creatinine, Ser: 0.7 mg/dL (ref 0.50–1.10)
Glucose, Bld: 99 mg/dL (ref 70–99)
HCT: 37 % (ref 36.0–46.0)
Hemoglobin: 12.6 g/dL (ref 12.0–15.0)
Potassium: 4.1 mEq/L (ref 3.5–5.1)
Sodium: 140 mEq/L (ref 135–145)
TCO2: 26 mmol/L (ref 0–100)

## 2011-07-18 LAB — CBC
HCT: 36.4 % (ref 36.0–46.0)
Hemoglobin: 12.2 g/dL (ref 12.0–15.0)
MCH: 29.9 pg (ref 26.0–34.0)
MCHC: 33.5 g/dL (ref 30.0–36.0)
MCV: 89.2 fL (ref 78.0–100.0)
Platelets: 274 10*3/uL (ref 150–400)
RBC: 4.08 MIL/uL (ref 3.87–5.11)
RDW: 13.8 % (ref 11.5–15.5)
WBC: 6.9 10*3/uL (ref 4.0–10.5)

## 2011-07-18 LAB — DIFFERENTIAL
Basophils Absolute: 0 10*3/uL (ref 0.0–0.1)
Basophils Relative: 0 % (ref 0–1)
Eosinophils Absolute: 0.2 10*3/uL (ref 0.0–0.7)
Eosinophils Relative: 3 % (ref 0–5)
Lymphocytes Relative: 29 % (ref 12–46)
Lymphs Abs: 2 10*3/uL (ref 0.7–4.0)
Monocytes Absolute: 0.5 10*3/uL (ref 0.1–1.0)
Monocytes Relative: 8 % (ref 3–12)
Neutro Abs: 4.1 10*3/uL (ref 1.7–7.7)
Neutrophils Relative %: 60 % (ref 43–77)

## 2011-07-18 LAB — PROTIME-INR
INR: 0.9 (ref 0.00–1.49)
Prothrombin Time: 12.3 seconds (ref 11.6–15.2)

## 2011-07-18 LAB — TROPONIN I: Troponin I: <0.3 ng/mL (ref <0.30)

## 2011-07-18 LAB — CK TOTAL AND CKMB (NOT AT ARMC)
CK, MB: 2.6 ng/mL (ref 0.3–4.0)
Relative Index: 2 (ref 0.0–2.5)
Total CK: 132 U/L (ref 7–177)

## 2011-07-19 LAB — HEPARIN LEVEL (UNFRACTIONATED)
Heparin Unfractionated: 0.21 IU/mL — ABNORMAL LOW (ref 0.30–0.70)
Heparin Unfractionated: 0.41 IU/mL (ref 0.30–0.70)

## 2011-07-19 LAB — CARDIAC PANEL(CRET KIN+CKTOT+MB+TROPI)
CK, MB: 2.2 ng/mL (ref 0.3–4.0)
CK, MB: 2.2 ng/mL (ref 0.3–4.0)
Relative Index: 2.2 (ref 0.0–2.5)
Relative Index: INVALID (ref 0.0–2.5)
Total CK: 101 U/L (ref 7–177)
Total CK: 97 U/L (ref 7–177)
Troponin I: <0.3 ng/mL (ref <0.30)
Troponin I: <0.3 ng/mL (ref <0.30)

## 2011-07-19 LAB — LIPID PANEL
Cholesterol: 150 mg/dL (ref 0–200)
HDL: 52 mg/dL (ref >39)
LDL Cholesterol: 73 mg/dL (ref 0–99)
Total CHOL/HDL Ratio: 2.9 RATIO
Triglycerides: 123 mg/dL (ref <150)
VLDL: 25 mg/dL (ref 0–40)

## 2011-07-19 LAB — CBC
HCT: 36 % (ref 36.0–46.0)
Hemoglobin: 12 g/dL (ref 12.0–15.0)
MCH: 29.6 pg (ref 26.0–34.0)
MCHC: 33.3 g/dL (ref 30.0–36.0)
MCV: 88.7 fL (ref 78.0–100.0)
Platelets: 217 10*3/uL (ref 150–400)
RBC: 4.06 MIL/uL (ref 3.87–5.11)
RDW: 13.7 % (ref 11.5–15.5)
WBC: 7.2 10*3/uL (ref 4.0–10.5)

## 2011-07-19 LAB — DIFFERENTIAL
Basophils Absolute: 0 10*3/uL (ref 0.0–0.1)
Basophils Relative: 0 % (ref 0–1)
Eosinophils Absolute: 0.3 10*3/uL (ref 0.0–0.7)
Eosinophils Relative: 4 % (ref 0–5)
Lymphocytes Relative: 35 % (ref 12–46)
Lymphs Abs: 2.5 10*3/uL (ref 0.7–4.0)
Monocytes Absolute: 0.6 10*3/uL (ref 0.1–1.0)
Monocytes Relative: 8 % (ref 3–12)
Neutro Abs: 3.8 10*3/uL (ref 1.7–7.7)
Neutrophils Relative %: 53 % (ref 43–77)

## 2011-07-19 LAB — HEMOGLOBIN A1C
Hgb A1c MFr Bld: 5.8 % — ABNORMAL HIGH (ref <5.7)
Mean Plasma Glucose: 120 mg/dL — ABNORMAL HIGH (ref <117)

## 2011-07-19 LAB — TSH: TSH: 0.299 u[IU]/mL — ABNORMAL LOW (ref 0.350–4.500)

## 2011-07-21 ENCOUNTER — Encounter: Payer: Self-pay | Admitting: *Deleted

## 2011-07-23 ENCOUNTER — Encounter: Payer: Self-pay | Admitting: Internal Medicine

## 2011-07-26 ENCOUNTER — Encounter: Payer: Self-pay | Admitting: Internal Medicine

## 2011-07-26 ENCOUNTER — Ambulatory Visit (INDEPENDENT_AMBULATORY_CARE_PROVIDER_SITE_OTHER): Payer: Medicare Other | Admitting: Internal Medicine

## 2011-07-26 VITALS — BP 140/78 | HR 76 | Ht 62.0 in | Wt 220.8 lb

## 2011-07-26 DIAGNOSIS — J309 Allergic rhinitis, unspecified: Secondary | ICD-10-CM

## 2011-07-26 DIAGNOSIS — G4733 Obstructive sleep apnea (adult) (pediatric): Secondary | ICD-10-CM

## 2011-07-26 NOTE — Progress Notes (Signed)
  Subjective:    Patient ID: Jackie Stevens, female    DOB: 04/09/1946, 65 y.o.   MRN: [id number]  HPI 07/26/11- followed for obstructive sleep apnea complicated by GERD, HBP, allergic rhinitis Last here 01/25/2011 Pending stress test for chest pain.  CPAP 10 Advanced- all night every night. It bothers her enough that she likes to leave the mask off after getting up for bathroom. Husband will tell her that she snores without it. We discussed comfort issues. Mostly she just doesn't like having something on her face. Review of Systems Constitutional:   No-   weight loss, night sweats, fevers, chills, fatigue, lassitude. HEENT:   No-  headaches, difficulty swallowing, tooth/dental problems, sore throat,   Some sharp twinges in right external ear canal      No-  sneezing, itching, ear ache, nasal congestion, post nasal drip,  CV:  No-   chest pain, orthopnea, PND, swelling in lower extremities, anasarca, dizziness, palpitations Resp: No-   shortness of breath with exertion or at rest.              No-   productive cough,  No non-productive cough,  No-  coughing up of blood.              No-   change in color of mucus.  No- wheezing.   Skin: No-   rash or lesions. GI:  No-   heartburn, indigestion, abdominal pain, nausea, vomiting, diarrhea,                 change in bowel habits, loss of appetite GU: No-   dysuria, change in color of urine, no urgency or frequency.  No- flank pain. MS:  No-   joint pain or swelling.  No- decreased range of motion.  No- back pain. Neuro- grossly normal to observation, Or:  Psych:  No- change in mood or affect. No depression or anxiety.  No memory loss.     Objective:   Physical Exam General- Alert, Oriented, Affect-appropriate, Distress- none acute  overweight Skin- rash-none, lesions- none, excoriation- none Lymphadenopathy- none Head- atraumatic            Eyes- Gross vision intact, PERRLA, conjunctivae clear secretions            Ears- Hearing, canals  slightly retracted, no fluid            Nose- Clear, No-Septal dev, mucus, polyps, erosion, perforation             Throat- Mallampati III , mucosa clear , drainage- none, tonsils- atrophic Neck- flexible , trachea midline, no stridor , thyroid nl, carotid no bruit Chest - symmetrical excursion , unlabored           Heart/CV- RRR , no murmur , no gallop  , no rub, nl s1 s2                           - JVD- none , edema- none, stasis changes- none, varices- none           Lung- clear to P&A, wheeze- none, cough- none , dullness-none, rub- none           Chest wall-  Abd- tender-no, distended-no, bowel sounds-present, HSM- no Br/ Gen/ Rectal- Not done, not indicated Extrem- cyanosis- none, clubbing, none, atrophy- none, strength- nl Neuro- grossly intact to observation         Assessment & Plan:

## 2011-07-26 NOTE — Assessment & Plan Note (Addendum)
Good compliance and control on 10 cwp. We continue to work on mask comfort and I made some suggestions she can discuss with her home care company.

## 2011-07-26 NOTE — Discharge Summary (Signed)
Jackie Stevens, Jackie Stevens                ACCOUNT NO.:  [id number]  MEDICAL RECORD NO.:  [id number]  LOCATION:  3738                         FACILITY:  [facility]  PHYSICIAN:  Cassell Clement, M.D. DATE OF BIRTH:  [birth year]  DATE OF ADMISSION:  07/18/2011 DATE OF DISCHARGE:  07/19/2011                              DISCHARGE SUMMARY   PRIMARY CARDIOLOGIST:  The patient has requested followup with Dr. Charlton Haws.  PRIMARY CARE PROVIDER:  Tera Mater. [place], MD  DISCHARGE DIAGNOSIS:  Chest pain without objective evidence of ischemia.  SECONDARY DIAGNOSES: 1. Obstructive sleep apnea on CPAP. 2. Hypertension. 3. Hyperlipidemia. 4. Hypothyroidism. 5. History of thyroid cancer status post thyroidectomy and I-131. 6. Diverticulosis. 7. Rosacea. 8. Gastroesophageal reflux disease. 9. Hiatal hernia. 10.Status post left knee surgery for medial and lateral meniscus tears     in February 2009.  ALLERGIES:  NEOSPORIN.PROCEDURES:  None.  HISTORY OF PRESENT ILLNESS:  A 65 year old female without prior cardiac history who was in her usual state of health approximately 1 week prior to admission when she began to experience occasional sharp and fleeting chest pain lasting just a few seconds resolving spontaneously.  On the morning of admission, she was sitting and eating breakfast and developed 4/10 substernal chest pressure and ache-like sensation with radiation to her shoulders that was associated with mild dyspnea and worse with deep breathing and sitting forward and better with lying flat.  She presented to a local urgent care and then subsequently referred to [facility] ED where ECG was nonacute and point-of-care markers were negative.  She is admitted for further evaluation.  HOSPITAL COURSE:  The patient is ruled out for MI.  She was placed on PPI therapy and has not had any recurrence of her chest discomfort.  She will be discharged home today in good condition and arrange for  an outpatient Lexiscan Myoview next week.  She will subsequently follow up with Dr. Eden Emms.  DISCHARGE LABS:  Hemoglobin 12.0, hematocrit 36.0, WBC 7.2, platelets 217, INR 0.90.  Sodium 140, potassium 4.1, chloride 105, BUN 16, creatinine 0.70.  Glucose 99.  Hemoglobin A1c 5.8.  CK 97, MB 2.2, troponin-I less than 0.30.  Total cholesterol 150, triglycerides 123, HDL 52, LDL 73.  TSH was 0.299.  DISPOSITION:  The patient will be discharged home today in good condition.  FOLLOWUP PLANS AND APPOINTMENTS:  The patient will follow up with Dr. Adrian Prince as previously scheduled.  She is to follow up at [facility] for a [facility] [facility] on July 27, 2011, at 9 a.m.  She will follow up with Dr. Eden Emms on August 05, 2011, at 11:45 a.m.  DISCHARGE MEDICATIONS: 1. Aspirin 81 mg daily. 2. Nitroglycerin 0.4 mg sublingual p.r.n. chest pain. 3. Prilosec OTC 20 mg daily. 4. Diovan HCT 160/12.5 mg daily. 5. Etodolac ER 400 mg b.i.d. 6. Glucosamine 1500 mg b.i.d. 7. MegaRed Omega 300 mg daily. 8. Metamucil over-the-counter 1 cap daily. 9. Reglan 10 mg q.i.d. before meals and nightly p.r.n. 10.Minocycline 100 mg daily. 11.Simvastatin 20 mg daily. 12.Synthroid 150 mcg daily. 13.Vitamin B12 1000 mcg daily. 14.Vitamin D3 1000 units daily.  OUTSTANDING LABS AND STUDIES:  Myoview is pending.  Duration of discharge encounter 40 minutes including physician time.     Nicolasa Ducking, ANP   ______________________________ Cassell Clement, M.D.    CB/MEDQ  D:  07/19/2011  T:  07/20/2011  Job:  [id number]  cc:   Jeannett Senior A. Evlyn Kanner, M.D.  Electronically Signed by Nicolasa Ducking ANP on 07/23/2011 03:35:03 PM Electronically Signed by Cassell Clement M.D. on 07/26/2011 02:27:17 PM

## 2011-07-26 NOTE — H&P (Signed)
Jackie Stevens, Jackie Stevens                ACCOUNT NO.:  [id number]  MEDICAL RECORD NO.:  [id number]  LOCATION:  3738                         FACILITY:  [facility]  PHYSICIAN:  Cassell Clement, M.D. DATE OF BIRTH:  June 12, 1946  DATE OF ADMISSION:  07/18/2011 DATE OF DISCHARGE:                             HISTORY & PHYSICAL   PRIMARY CARDIOLOGIST:  New to [facility].  The patient is requested to follow up with Dr. Eden Emms.  PATIENT PROFILE:  A 65 year old female without prior cardiac history presents with a several-hour history of chest pain.  PROBLEM LIST: 1. Chest pain. 2. Obstructive sleep apnea, on CPAP. 3. Hypertension. 4. Hyperlipidemia. 5. Hypothyroidism. 6. History of thyroid cancer status post thyroidectomy, on I-131. 7. Diverticulosis. 8. Rosacea. 9. Gastroesophageal reflux disease/hiatal hernia. 10.History of left knee medial and lateral meniscus tear status post     surgery in February 2009.  ALLERGIES:  NEOSPORIN.  HISTORY OF PRESENT ILLNESS:  A 65 year old female without prior cardiac history.  She was in her usual state of health for earlier this week which she began to note occasional sharp, fleeting chest pain that resolved spontaneously after just a few seconds.  Today at about 6:00 a.m., she was sitting and eating breakfast and developed 4/10 substernal chest pressure and ache-like sensation which she said felt like a deep bruise.  She also felt weak and mildly dyspneic.  Chest pain was worse with breathing and sitting forward and better with lying flat.  She went to a local urgent care and then was referred to the [facility] ED where enzymes have been negative and ECG is nonacute.  She still has 4/10 chest pain.  It is now 11 hours since onset.  HOME MEDICATIONS: 1. Diovan and HCT 160/12.5 mg daily. 2. Simvastatin 40 mg daily. 3. Synthroid 150 mcg daily. 4. Etodolac 400 mg b.i.d. 5. Vitamin D3 daily. 6. Reglan 10 mg q.i.d. 7. Calcium 600 mg b.i.d. 8.  Fish oil 1000 mg t.i.d. 9. Glucosamine 1500 mg 2 tablets daily. 10.B12, 100 mg daily. 11.Minocycline 100 mg b.i.d. 12.MetroGel as prescribed. 13.Metamucil p.r.n.  FAMILY HISTORY:  Mother died of an MI in her 46s.  Father died of pneumonia at 55.  Brother has a history of COPD.  SOCIAL HISTORY:  The patient lives in [place] with her husband.  She is retired from [employer].  She denies tobacco or drug use and has an occasional glass of wine.  She does not routinely exercise.  REVIEW OF SYSTEMS:  Positive for hot flashes, chest pain, mild dyspnea, and constipation.  Otherwise, all systems are reviewed and negative. She is a full code.  PHYSICAL EXAMINATION:  VITAL SIGNS:  Temperature 98.2, heart rate 69, respirations 20, blood pressure 127/51, and pulse oximetry 100% on 2 liters. GENERAL:  A pleasant white female in no acute distress.  Awake, alert, and oriented x3.  She has normal affect. HEENT:  Normal. NEUROLOGIC:  Grossly intact, nonfocal. SKIN:  Warm and dry without lesions or masses. NECK:  Supple without bruits or JVD. LUNGS:  Respirations are regular and unlabored.  Clear to auscultation. CARDIAC:  Regular S1 and S2.  No S3, S4, or murmurs. ABDOMEN:  Round, soft, nontender, and nondistended.  Bowel sounds present x4. EXTREMITIES:  Warm, dry, and pink.  No clubbing, cyanosis, or edema. Dorsalis pedis and posterior tibial pulses are 2+ and equal bilaterally.  Chest x-ray showed mild cardiomegaly, no edema, bronchitic changes.  EKG shows sinus rhythm, rate of 70, left axis, no acute ST-T changes. Hemoglobin 12.6 and hematocrit 37.0.  Sodium 140, potassium 4.1, chloride 105, CO2 of 26, BUN 16, creatinine 1.08, and glucose 99.  CK 132, MB 2.6, and troponin less than 0.3.  ASSESSMENT AND PLAN: 1. Chest pain with typical and atypical features.  She had 11 hours     duration at this time and enzymes are currently negative and ECG     nonacute.  Plan to admit and cycle  enzymes.  Continue home     medications.  If enzymes are negative and chest pain is resolved,     plan discharge in a.m. with outpatient Myoview.  If enzymes are     negative but continues to chest pain, probably plan inpatient     Myoview while if enzymes are positive, plan cath. 2. Hypertension.  Stable.  Continue home meds. 3. Hyperlipidemia.  Check lipids and LFTs.  Continue statin therapy.    Nicolasa Ducking, ANP   ______________________________ Cassell Clement, M.D.   CB/MEDQ  D:  07/18/2011  T:  07/19/2011  Job:  [id number]  Electronically Signed by Nicolasa Ducking ANP on 07/23/2011 03:34:57 PM Electronically Signed by Cassell Clement M.D. on 07/26/2011 02:27:13 PM

## 2011-07-26 NOTE — Assessment & Plan Note (Signed)
Good control

## 2011-07-26 NOTE — Patient Instructions (Signed)
Continue CPAP at 10. Please call as needed

## 2011-07-27 ENCOUNTER — Ambulatory Visit (HOSPITAL_COMMUNITY): Payer: Medicare Other | Attending: Nurse Practitioner | Admitting: Radiology

## 2011-07-27 DIAGNOSIS — R079 Chest pain, unspecified: Secondary | ICD-10-CM

## 2011-07-27 DIAGNOSIS — R0602 Shortness of breath: Secondary | ICD-10-CM

## 2011-07-27 MED ORDER — TECHNETIUM TC 99M TETROFOSMIN IV KIT
33.0000 | PACK | Freq: Once | INTRAVENOUS | Status: AC | PRN
  Administered 2011-07-27: 33 via INTRAVENOUS

## 2011-07-27 MED ORDER — REGADENOSON 0.4 MG/5ML IV SOLN
0.4000 mg | Freq: Once | INTRAVENOUS | Status: AC
  Administered 2011-07-27: 0.4 mg via INTRAVENOUS

## 2011-07-27 MED ORDER — TECHNETIUM TC 99M TETROFOSMIN IV KIT
11.0000 | PACK | Freq: Once | INTRAVENOUS | Status: AC | PRN
  Administered 2011-07-27: 11 via INTRAVENOUS

## 2011-07-27 NOTE — Progress Notes (Signed)
[facility] SITE 3 NUCLEAR MED [address] [place] [address] [phone]  Cardiology Nuclear Med Study  Jackie Stevens is a 65 y.o. female [id number] 12-31-1945   Nuclear Med Background Indication for Stress Test:  Evaluation for Ischemia and Abnormal EKG History:  No previous documented CAD Cardiac Risk Factors: Hypertension and Lipids  Symptoms:  Chest Pain and SOB   Nuclear Pre-Procedure Caffeine/Decaff Intake:  None NPO After: 9:00pm   Lungs:  clear IV 0.9% NS with Angio Cath:  20g  IV Site: R Antecubital  IV Started by:  Stanton Kidney, EMT-P  Chest Size (in):  46 Cup Size: DD  Height: 5\' 2"  (1.575 m)  Weight:  220 lb (99.791 kg)  BMI:  Body mass index is 40.24 kg/(m^2). Tech Comments:  NA    Nuclear Med Study 1 or 2 day study: 1 day  Stress Test Type:  Eugenie Birks  Reading MD: Charlton Haws, MD  Order Authorizing Provider:  P.Nishan  Resting Radionuclide: Technetium 85m Tetrofosmin  Resting Radionuclide Dose: 11.0 mCi   Stress Radionuclide:  Technetium 33m Tetrofosmin  Stress Radionuclide Dose: 33.0 mCi           Stress Protocol Rest HR: 70 Stress HR: 95  Rest BP: 135/68 Stress BP: 151/45  Exercise Time (min): n/a METS: n/a   Predicted Max HR: 154 bpm % Max HR: 61.69 bpm Rate Pressure Product: 21308   Dose of Adenosine (mg):  n/a Dose of Lexiscan: 0.4 mg  Dose of Atropine (mg): n/a Dose of Dobutamine: n/a mcg/kg/min (at max HR)  Stress Test Technologist: Milana Na, EMT-P  Nuclear Technologist:  Domenic Polite, CNMT     Rest Procedure:  Myocardial perfusion imaging was performed at rest 45 minutes following the intravenous administration of Technetium 45m Tetrofosmin. Rest ECG: NSR  Stress Procedure:  The patient received IV Lexiscan 0.4 mg over 15-seconds.  Technetium 78m Tetrofosmin injected at 30-seconds.  There were no significant changes, throat tightness, and pressure in her eyes with Lexiscan.  Quantitative spect images were  obtained after a 45 minute delay. Stress ECG: No significant change from baseline ECG  QPS Raw Data Images:  Normal; no motion artifact; normal heart/lung ratio. Stress Images:  Normal homogeneous uptake in all areas of the myocardium. Rest Images:  Normal homogeneous uptake in all areas of the myocardium. Subtraction (SDS):  Normal Transient Ischemic Dilatation (Normal <1.22):  0.98 Lung/Heart Ratio (Normal <0.45):  0.32  Quantitative Gated Spect Images QGS EDV:  82 ml QGS ESV:  23 ml QGS cine images:  NL LV Function; NL Wall Motion QGS EF: 72%  Impression Exercise Capacity:  Lexiscan with no exercise. BP Response:  Normal blood pressure response. Clinical Symptoms:  No chest pain. ECG Impression:  No significant ST segment change suggestive of ischemia. Comparison with Prior Nuclear Study: No images to compare  Overall Impression:  Normal stress nuclear study.     Charlton Haws

## 2011-07-28 NOTE — Progress Notes (Signed)
nuc med study routed to Dr. Eden Emms & Dr. Melba Coon 07/28/11 Jackie Stevens

## 2011-07-30 NOTE — Progress Notes (Signed)
pt aware of results Debra Mathis  

## 2011-08-05 ENCOUNTER — Ambulatory Visit (INDEPENDENT_AMBULATORY_CARE_PROVIDER_SITE_OTHER): Payer: Medicare Other | Admitting: Cardiovascular Disease

## 2011-08-05 ENCOUNTER — Encounter: Payer: Self-pay | Admitting: Cardiovascular Disease

## 2011-08-05 DIAGNOSIS — E785 Hyperlipidemia, unspecified: Secondary | ICD-10-CM

## 2011-08-05 DIAGNOSIS — I10 Essential (primary) hypertension: Secondary | ICD-10-CM

## 2011-08-05 DIAGNOSIS — R079 Chest pain, unspecified: Secondary | ICD-10-CM | POA: Insufficient documentation

## 2011-08-05 NOTE — Progress Notes (Signed)
65 yo admitted to hospital 8/7 with SSCP.  GI overtones.  Improved with PPI.  R/O no ECG changes.  D/C for outpatient myovue.  CRF;s HTN and elevated lipids Reviewed myovue from 8/14 normal Lexiscan with EF 72%.  BP been running high.  Compiant with valsartin/HCTZ/  Sedentary with husband that weights 400 lbs.  I believe I have seen him in the past for significant AS.  Discussed diet and exercise.  She will get a BP cuff and we will reevaluate in a few weeks.  Would likely increase valsartin to 320 mg as next step.  I think her dyspnea and labile BP are related to weight.  ROS: Denies fever, malais, weight loss, blurry vision, decreased visual acuity, cough, sputum, SOB, hemoptysis, pleuritic pain, palpitaitons, heartburn, abdominal pain, melena, lower extremity edema, claudication, or rash.  All other systems reviewed and negative   General: Affect appropriate Healthy:  appears stated age HEENT: normal Neck supple with no adenopathy JVP normal no bruits no thyromegaly Lungs clear with no wheezing and good diaphragmatic motion Heart:  S1/S2 no murmur,rub, gallop or click PMI normal Abdomen: benighn, BS positve, no tenderness, no AAA no bruit.  No HSM or HJR Distal pulses intact with no bruits No edema Neuro non-focal Skin warm and dry No muscular weakness  Medications Current Outpatient Prescriptions  Medication Sig Dispense Refill  . aspirin 81 MG tablet Take 81 mg by mouth daily.        . calcium carbonate (OS-CAL) 600 MG TABS Take 600 mg by mouth 2 (two) times daily with a meal.        . Cholecalciferol (VITAMIN D) 1000 UNITS capsule Take 1,000 Units by mouth daily.        Marland Kitchen etodolac (LODINE) 400 MG tablet Take 400 mg by mouth 2 (two) times daily.        . Glucosamine-Chondroit-Vit C-Mn (GLUCOSAMINE 1500 COMPLEX) CAPS Take 1 capsule by mouth daily.        Marland Kitchen levothyroxine (SYNTHROID, LEVOTHROID) 150 MCG tablet Take 150 mcg by mouth daily.        . metoCLOPramide (REGLAN) 10 MG tablet  Take 10 mg by mouth 4 (four) times daily.        . minocycline (MINOCIN,DYNACIN) 100 MG capsule Take 100 mg by mouth daily.        Marland Kitchen NITROSTAT 0.4 MG SL tablet       . Omega-3 Fatty Acids (FISH OIL) 1200 MG CAPS Take 1 capsule by mouth daily.        Marland Kitchen omeprazole (PRILOSEC) 20 MG capsule Take 20 mg by mouth daily.        . Psyllium-Calcium (METAMUCIL PLUS CALCIUM PO) Take by mouth.        . simvastatin (ZOCOR) 40 MG tablet Take 40 mg by mouth at bedtime.        . valsartan-hydrochlorothiazide (DIOVAN-HCT) 160-12.5 MG per tablet Take 1 tablet by mouth daily.        . vitamin B-12 (CYANOCOBALAMIN) 1000 MCG tablet Take 1,000 mcg by mouth daily.        . vitamin E 400 UNIT capsule Take 400 Units by mouth daily.          Allergies Niacin and Triple antibiotic  Family History: Family History  Problem Relation Age of Onset  . Heart disease Mother   . Heart disease Father   . Cancer Paternal Aunt   . Alzheimer's disease Mother   . Heart disease Mother   .  Pneumonia Father     Social History: History   Social History  . Marital Status: Married    Spouse Name: N/A    Number of Children: N/A  . Years of Education: N/A   Occupational History  . Not on file.   Social History Main Topics  . Smoking status: Never Smoker   . Smokeless tobacco: Not on file  . Alcohol Use: Yes     2 glasses wine per week  . Drug Use: Not on file  . Sexually Active: Not on file   Other Topics Concern  . Not on file   Social History Narrative   Married children    Electrocardiogram:  Assessment and Plan

## 2011-08-05 NOTE — Patient Instructions (Signed)
Your physician recommends that you schedule a follow-up appointment in: 6-8 weeks with Dr Eden Emms

## 2011-08-05 NOTE — Assessment & Plan Note (Signed)
Home recording.  Contineu 2 drug Rx.  F/U 6-8 weeks

## 2011-08-05 NOTE — Assessment & Plan Note (Signed)
Cholesterol is at goal.  Continue current dose of statin and diet Rx.  No myalgias or side effects.  F/U  LFT's in 6 months. Lab Results  Component Value Date   LDLCALC 73 07/19/2011

## 2011-08-05 NOTE — Assessment & Plan Note (Signed)
Not likely cardiac with recent hospital R/O and normal myovue 8/14  Continue PPI

## 2011-09-03 LAB — POCT I-STAT 4, (NA,K, GLUC, HGB,HCT)
Glucose, Bld: 94
HCT: 40
Hemoglobin: 13.6
Operator id: 280881
Potassium: 4.1
Sodium: 138

## 2011-09-23 ENCOUNTER — Ambulatory Visit (INDEPENDENT_AMBULATORY_CARE_PROVIDER_SITE_OTHER): Payer: Medicare Other | Admitting: Cardiovascular Disease

## 2011-09-23 ENCOUNTER — Encounter: Payer: Self-pay | Admitting: Cardiovascular Disease

## 2011-09-23 DIAGNOSIS — R079 Chest pain, unspecified: Secondary | ICD-10-CM

## 2011-09-23 DIAGNOSIS — E785 Hyperlipidemia, unspecified: Secondary | ICD-10-CM

## 2011-09-23 DIAGNOSIS — I10 Essential (primary) hypertension: Secondary | ICD-10-CM

## 2011-09-23 NOTE — Progress Notes (Signed)
65 yo admitted to hospital 8/7 with SSCP. GI overtones. Improved with PPI. R/O no ECG changes. D/C for outpatient myovue. CRF;s HTN and elevated lipids Reviewed myovue from 8/14 normal Lexiscan with EF 72%. BP been running high. Compiant with valsartin/HCTZ/ Sedentary with husband that weights 400 lbs. I believe I have seen him in the past for significant AS. Discussed diet and exercise  BP readings at home have been good and BP good in office today.  Some vertiginous symptoms with dizzyness that is non cardiac  Significant arthritis in knees.  Hesitant to have surgery as there is limited care besides her for her husband  ROS: Denies fever, malais, weight loss, blurry vision, decreased visual acuity, cough, sputum, SOB, hemoptysis, pleuritic pain, palpitaitons, heartburn, abdominal pain, melena, lower extremity edema, claudication, or rash.  All other systems reviewed and negative  General: Affect appropriate Healthy:  appears stated age HEENT: normal Neck supple with no adenopathy JVP normal no bruits no thyromegaly Lungs clear with no wheezing and good diaphragmatic motion Heart:  S1/S2 no murmur,rub, gallop or click PMI normal Abdomen: benighn, BS positve, no tenderness, no AAA no bruit.  No HSM or HJR Distal pulses intact with no bruits No edema Neuro non-focal Skin warm and dry No muscular weakness   Current Outpatient Prescriptions  Medication Sig Dispense Refill  . aspirin 81 MG tablet Take 81 mg by mouth daily.        . calcium carbonate (OS-CAL) 600 MG TABS Take 600 mg by mouth 2 (two) times daily with a meal.        . Cholecalciferol (VITAMIN D) 1000 UNITS capsule Take 1,000 Units by mouth daily.        Marland Kitchen etodolac (LODINE) 400 MG tablet Take 400 mg by mouth 2 (two) times daily.        . Glucosamine-Chondroit-Vit C-Mn (GLUCOSAMINE 1500 COMPLEX) CAPS Take 1 capsule by mouth daily.        Marland Kitchen levothyroxine (SYNTHROID, LEVOTHROID) 150 MCG tablet 1/2  tabs once a week      .  metoCLOPramide (REGLAN) 10 MG tablet Take 10 mg by mouth 4 (four) times daily.       . minocycline (MINOCIN,DYNACIN) 100 MG capsule Take 100 mg by mouth daily.        Marland Kitchen NITROSTAT 0.4 MG SL tablet       . Omega-3 Fatty Acids (FISH OIL) 1200 MG CAPS Take 1 capsule by mouth daily.        Marland Kitchen omeprazole (PRILOSEC) 20 MG capsule Take 20 mg by mouth daily.        . Psyllium-Calcium (METAMUCIL PLUS CALCIUM PO) Take by mouth.        . simvastatin (ZOCOR) 40 MG tablet Take 40 mg by mouth at bedtime.        . valsartan-hydrochlorothiazide (DIOVAN-HCT) 160-12.5 MG per tablet Take 1 tablet by mouth daily.        . vitamin B-12 (CYANOCOBALAMIN) 1000 MCG tablet Take 1,000 mcg by mouth daily.          Allergies  Niacin and Triple antibiotic  Electrocardiogram:  Assessment and Plan

## 2011-09-23 NOTE — Assessment & Plan Note (Signed)
Normal myovue observe for now

## 2011-09-23 NOTE — Assessment & Plan Note (Signed)
Cholesterol is at goal.  Continue current dose of statin and diet Rx.  No myalgias or side effects.  F/U  LFT's in 6 months. Lab Results  Component Value Date   LDLCALC 73 07/19/2011             

## 2011-09-23 NOTE — Patient Instructions (Signed)
Your physician wants you to follow-up in: 6 MONTHS You will receive a reminder letter in the mail two months in advance. If you don't receive a letter, please call our office to schedule the follow-up appointment. 

## 2011-09-23 NOTE — Assessment & Plan Note (Signed)
Well controlled.  Continue current medications and low sodium Dash type diet.    

## 2011-10-14 LAB — HM MAMMOGRAPHY: HM Mammogram: NORMAL

## 2012-02-02 ENCOUNTER — Ambulatory Visit: Payer: Medicare Other | Admitting: Cardiovascular Disease

## 2012-02-09 ENCOUNTER — Ambulatory Visit (INDEPENDENT_AMBULATORY_CARE_PROVIDER_SITE_OTHER): Payer: Medicare Other | Admitting: Cardiovascular Disease

## 2012-02-09 ENCOUNTER — Encounter: Payer: Self-pay | Admitting: Cardiovascular Disease

## 2012-02-09 DIAGNOSIS — E785 Hyperlipidemia, unspecified: Secondary | ICD-10-CM

## 2012-02-09 DIAGNOSIS — I10 Essential (primary) hypertension: Secondary | ICD-10-CM

## 2012-02-09 DIAGNOSIS — R079 Chest pain, unspecified: Secondary | ICD-10-CM

## 2012-02-09 MED ORDER — VALSARTAN-HYDROCHLOROTHIAZIDE 320-25 MG PO TABS: 1.0000 | ORAL_TABLET | Freq: Every day | ORAL | Status: DC

## 2012-02-09 NOTE — Progress Notes (Signed)
66 yo admitted to hospital 8/7 with SSCP. GI overtones. Improved with PPI. R/O no ECG changes. D/C for outpatient myovue. CRF;s HTN and elevated lipids Reviewed myovue from 8/14 normal Lexiscan with EF 72%. BP been running high. Compiant with valsartin/HCTZ/ Sedentary with husband that weights 400 lbs. I believe I have seen him in the past for significant AS. Discussed diet and exercise BP readings at home have been good and BP good in office today. Some vertiginous symptoms with dizzyness that is non cardiac  Significant arthritis in knees. Hesitant to have surgery as there is limited care besides her for her husband   ROS: Denies fever, malais, weight loss, blurry vision, decreased visual acuity, cough, sputum, SOB, hemoptysis, pleuritic pain, palpitaitons, heartburn, abdominal pain, melena, lower extremity edema, claudication, or rash.  All other systems reviewed and negative  General: Affect appropriate Healthy:  appears stated age HEENT: normal Neck supple with no adenopathy JVP normal no bruits no thyromegaly Lungs clear with no wheezing and good diaphragmatic motion Heart:  S1/S2 no murmur, no rub, gallop or click PMI normal Abdomen: benighn, BS positve, no tenderness, no AAA no bruit.  No HSM or HJR Distal pulses intact with no bruits No edema Neuro non-focal Skin warm and dry No muscular weakness   Current Outpatient Prescriptions  Medication Sig Dispense Refill  . aspirin 81 MG tablet Take 81 mg by mouth daily.        . calcium carbonate (OS-CAL) 600 MG TABS Take 600 mg by mouth 2 (two) times daily with a meal.        . etodolac (LODINE) 400 MG tablet Take 400 mg by mouth 2 (two) times daily.        Marland Kitchen levothyroxine (SYNTHROID, LEVOTHROID) 150 MCG tablet 1 1/2  tabs once a week      . metoCLOPramide (REGLAN) 10 MG tablet Take 10 mg by mouth as needed.       . minocycline (MINOCIN,DYNACIN) 100 MG capsule Take 100 mg by mouth daily.        Marland Kitchen NITROSTAT 0.4 MG SL tablet Place 0.4  mg under the tongue every 5 (five) minutes as needed.       . Omega-3 Fatty Acids (FISH OIL) 1200 MG CAPS Take 1 capsule by mouth daily.        Marland Kitchen omeprazole (PRILOSEC) 20 MG capsule Take 20 mg by mouth daily.        . Psyllium-Calcium (METAMUCIL PLUS CALCIUM PO) Take 1 tablet by mouth daily.       . simvastatin (ZOCOR) 40 MG tablet Take 40 mg by mouth at bedtime.        . valsartan-hydrochlorothiazide (DIOVAN-HCT) 160-12.5 MG per tablet Take 1 tablet by mouth daily.        . vitamin B-12 (CYANOCOBALAMIN) 1000 MCG tablet Take 1,000 mcg by mouth daily.          Allergies  Niacin and Triple antibiotic  Electrocardiogram:  Assessment and Plan

## 2012-02-09 NOTE — Assessment & Plan Note (Signed)
Cholesterol is at goal.  Continue current dose of statin and diet Rx.  No myalgias or side effects.  F/U  LFT's in 6 months. Lab Results  Component Value Date   LDLCALC 73 07/19/2011             

## 2012-02-09 NOTE — Patient Instructions (Signed)
Your physician recommends that you schedule a follow-up appointment in: 8 WEEKS WITH DR [facility] Your physician recommends that you continue on your current medications as directed. Please refer to the Current Medication list given to you today.INCREASE DIOVAN  TO 320/25 MG Your physician recommends that you return for lab work in: 1 MONTH BMET  DX V58.59

## 2012-02-09 NOTE — Assessment & Plan Note (Signed)
Increase Diovan and HCTZ  BMET in 4 weeks  F/U 8 weeks

## 2012-02-09 NOTE — Assessment & Plan Note (Signed)
Atypical  Normal myovue 8/12

## 2012-03-08 ENCOUNTER — Telehealth: Payer: Self-pay | Admitting: *Deleted

## 2012-03-08 ENCOUNTER — Ambulatory Visit (INDEPENDENT_AMBULATORY_CARE_PROVIDER_SITE_OTHER): Payer: Medicare Other | Admitting: *Deleted

## 2012-03-08 DIAGNOSIS — I10 Essential (primary) hypertension: Secondary | ICD-10-CM

## 2012-03-08 LAB — BASIC METABOLIC PANEL
BUN: 16 mg/dL (ref 6–23)
CO2: 29 mEq/L (ref 19–32)
Calcium: 9 mg/dL (ref 8.4–10.5)
Chloride: 98 mEq/L (ref 96–112)
Creatinine, Ser: 0.7 mg/dL (ref 0.4–1.2)
GFR: 90.04 mL/min (ref >60.00)
Glucose, Bld: 92 mg/dL (ref 70–99)
Potassium: 4 mEq/L (ref 3.5–5.1)
Sodium: 136 mEq/L (ref 135–145)

## 2012-03-08 NOTE — Progress Notes (Signed)
Quick Note:  Please report to patient. The recent labs are stable. Continue same medication and careful diet. ______ 

## 2012-03-08 NOTE — Telephone Encounter (Signed)
AWAITING  LAB RESULTS  PT NEEDS REFILLS ON  GEN DIOVAN -HCT  320-25 MG  WILL  NEED TO SEND  IN SCRIPT TO [facility]  IF LABS ARE NORMAL./CY

## 2012-03-09 ENCOUNTER — Telehealth: Payer: Self-pay | Admitting: *Deleted

## 2012-03-09 DIAGNOSIS — I119 Hypertensive heart disease without heart failure: Secondary | ICD-10-CM

## 2012-03-09 MED ORDER — VALSARTAN-HYDROCHLOROTHIAZIDE 320-25 MG PO TABS: 1.0000 | ORAL_TABLET | Freq: Every day | ORAL | Status: DC

## 2012-03-09 NOTE — Telephone Encounter (Signed)
Advised of labs and sent Rx to [facility]

## 2012-03-09 NOTE — Telephone Encounter (Signed)
Message copied by Burnell Blanks on Thu Mar 09, 2012  1:45 PM ------      Message from: Cassell Clement      Created: Wed Mar 08, 2012 10:00 PM       Please report to patient.  The recent labs are stable. Continue same medication and careful diet.

## 2012-04-06 ENCOUNTER — Ambulatory Visit: Payer: Medicare Other | Admitting: Cardiovascular Disease

## 2012-04-21 ENCOUNTER — Ambulatory Visit (INDEPENDENT_AMBULATORY_CARE_PROVIDER_SITE_OTHER): Payer: Medicare Other | Admitting: Cardiovascular Disease

## 2012-04-21 ENCOUNTER — Encounter: Payer: Self-pay | Admitting: Cardiovascular Disease

## 2012-04-21 VITALS — BP 164/81 | HR 77 | Wt 220.0 lb

## 2012-04-21 DIAGNOSIS — I10 Essential (primary) hypertension: Secondary | ICD-10-CM

## 2012-04-21 DIAGNOSIS — E785 Hyperlipidemia, unspecified: Secondary | ICD-10-CM

## 2012-04-21 DIAGNOSIS — R079 Chest pain, unspecified: Secondary | ICD-10-CM

## 2012-04-21 NOTE — Progress Notes (Signed)
66 yo admitted to hospital 8/7 with SSCP. GI overtones. Improved with PPI. R/O no ECG changes. D/C for outpatient myovue. CRF;s HTN and elevated lipids Reviewed myovue from 8/14 normal Lexiscan with EF 72%. BP been running high. Compiant with valsartin/HCTZ/ Sedentary with husband that weights 400 lbs. I believe I have seen him in the past for significant AS. Discussed diet and exercise BP readings at home have been good and BP good in office today. Some vertiginous symptoms with dizzyness that is non cardiac  Significant arthritis in knees. Hesitant to have surgery as there is limited care besides her for her husband  ROS: Denies fever, malais, weight loss, blurry vision, decreased visual acuity, cough, sputum, SOB, hemoptysis, pleuritic pain, palpitaitons, heartburn, abdominal pain, melena, lower extremity edema, claudication, or rash.  All other systems reviewed and negative  General: Affect appropriate Healthy:  appears stated age HEENT: normal Neck supple with no adenopathy JVP normal no bruits no thyromegaly Lungs clear with no wheezing and good diaphragmatic motion Heart:  S1/S2 no murmur, no rub, gallop or click PMI normal Abdomen: benighn, BS positve, no tenderness, no AAA no bruit.  No HSM or HJR Distal pulses intact with no bruits No edema Neuro non-focal Skin warm and dry No muscular weakness   Current Outpatient Prescriptions  Medication Sig Dispense Refill  . aspirin 81 MG tablet Take 81 mg by mouth daily.        Marland Kitchen etodolac (LODINE) 400 MG tablet Take 400 mg by mouth 2 (two) times daily.        Marland Kitchen levothyroxine (SYNTHROID, LEVOTHROID) 150 MCG tablet 1 1/2  tabs once a week      . metoCLOPramide (REGLAN) 10 MG tablet Take 10 mg by mouth as needed.       Marland Kitchen NITROSTAT 0.4 MG SL tablet Place 0.4 mg under the tongue every 5 (five) minutes as needed.       . Omega-3 Fatty Acids (FISH OIL) 1200 MG CAPS Take 1 capsule by mouth daily.        Marland Kitchen omeprazole (PRILOSEC) 20 MG capsule  Take 20 mg by mouth daily.        . Psyllium-Calcium (METAMUCIL PLUS CALCIUM PO) Take 1 tablet by mouth daily.       . simvastatin (ZOCOR) 40 MG tablet Take 40 mg by mouth at bedtime.        . valsartan-hydrochlorothiazide (DIOVAN-HCT) 320-25 MG per tablet Take 1 tablet by mouth daily.  90 tablet  3  . vitamin B-12 (CYANOCOBALAMIN) 1000 MCG tablet Take 1,000 mcg by mouth daily.          Allergies  Neomycin-bacitracin zn-polymyx and Niacin  Electrocardiogram:  Assessment and Plan

## 2012-04-21 NOTE — Assessment & Plan Note (Signed)
Normal myovue non recurrent observe

## 2012-04-21 NOTE — Patient Instructions (Signed)
Your physician wants you to follow-up in:  6 MONTHS WITH DR NISHAN  You will receive a reminder letter in the mail two months in advance. If you don't receive a letter, please call our office to schedule the follow-up appointment. Your physician recommends that you continue on your current medications as directed. Please refer to the Current Medication list given to you today. 

## 2012-04-21 NOTE — Assessment & Plan Note (Signed)
May need better control.  Patient to monitor at home.  If high will add norvasc 10mg  Discussed weight loss and exercise

## 2012-04-21 NOTE — Assessment & Plan Note (Signed)
Cholesterol is at goal.  Continue current dose of statin and diet Rx.  No myalgias or side effects.  F/U  LFT's in 6 months. Lab Results  Component Value Date   LDLCALC 73 07/19/2011             

## 2012-05-09 ENCOUNTER — Telehealth: Payer: Self-pay | Admitting: Cardiovascular Disease

## 2012-05-09 NOTE — Telephone Encounter (Signed)
Forwarded to Dr/nurse

## 2012-05-09 NOTE — Telephone Encounter (Signed)
New Problem:    Patient called in to report the average of her BP readings over the past two weeks: 134/67.  Please call back if you have any questions.

## 2012-05-11 NOTE — Telephone Encounter (Signed)
SPOKE WITH PT  B/P AVERAGES ARE ACCEPTABLE  WILL FORWARD  TO DR Eden Emms FOR REVIEW AND IF NEED TO MAKE ANY CHANGES  WILL CALL PT BACK .Zack Seal

## 2012-05-11 NOTE — Telephone Encounter (Signed)
PT AWARE TO CONT WITH SAME MEDS .Jackie Stevens

## 2012-05-11 NOTE — Telephone Encounter (Signed)
Continue valsartin

## 2012-07-08 ENCOUNTER — Telehealth: Payer: Self-pay | Admitting: Physician Assistant

## 2012-07-08 NOTE — Telephone Encounter (Signed)
Patient called regarding elevated BP (SBP 150s). She tells me her SBP usually runs in the 130s. She does state that she noticed a "glare" in her eye at that time. She took an ASA and called the answering service wondering if she should be concerned. She denies lightheadedness, chest pain, AMS/confusion or shortness of breath. Reviewing prior office notes, Norvasc 10mg  daily was scheduled to be added for further BP control, but the patient states she has not been prescribed this. The last time she saw Dr. Eden Emms, she reports that her ARB was increased. Advised to continue to monitor BP throughout the day. If it is consistently elevated, will add Norvasc. Advised to present to the ED or PCP follow-up if her visual disturbance becomes severe/permanent or more persistent, respectively. As this was an isolated incident in mildly elevated BP, advised to continue to monitor. She understood and agreed.    Jacqulyn Bath, PA-C 07/08/2012 12:14 PM

## 2012-07-25 ENCOUNTER — Ambulatory Visit (INDEPENDENT_AMBULATORY_CARE_PROVIDER_SITE_OTHER): Payer: Medicare Other | Admitting: Internal Medicine

## 2012-07-25 ENCOUNTER — Encounter: Payer: Self-pay | Admitting: Internal Medicine

## 2012-07-25 VITALS — BP 132/76 | HR 83 | Ht 62.0 in | Wt 219.6 lb

## 2012-07-25 DIAGNOSIS — G4733 Obstructive sleep apnea (adult) (pediatric): Secondary | ICD-10-CM

## 2012-07-25 DIAGNOSIS — J309 Allergic rhinitis, unspecified: Secondary | ICD-10-CM

## 2012-07-25 MED ORDER — ZALEPLON 10 MG PO CAPS: ORAL_CAPSULE | ORAL | Status: DC

## 2012-07-25 NOTE — Progress Notes (Signed)
  Subjective:    Patient ID: Jackie Stevens, female    DOB: 04-03-1946, 66 y.o.   MRN: [id number]  HPI 07/26/11- followed for obstructive sleep apnea complicated by GERD, HBP, allergic rhinitis Last here 01/25/2011 Pending stress test for chest pain.  CPAP 10 Advanced- all night every night. It bothers her enough that she likes to leave the mask off after getting up for bathroom. Husband will tell her that she snores without it. We discussed comfort issues. Mostly she just doesn't like having something on her face.  07/25/12- 65 yoF never smoker followed for obstructive sleep apnea complicated by GERD, HBP, allergic rhinitis Wears CPAP10/Advanced  every night-except when having trouble with sleeping overall.  New complaint-sore throat x5 days. Ears itch without popping. Some watery nose and occasional sneeze with dry cough.  Review of Systems-see HPI Constitutional:   No-   weight loss, night sweats, fevers, chills, fatigue, lassitude. HEENT:   No-  headaches, difficulty swallowing, tooth/dental problems, sore throat,   Some sharp twinges in right external ear canal      + sneezing, itching, ear ache,+ nasal congestion, post nasal drip,  CV:  No-   chest pain, orthopnea, PND, swelling in lower extremities, anasarca, dizziness, palpitations Resp: No-   shortness of breath with exertion or at rest.              No-   productive cough,  + non-productive cough,  No-  coughing up of blood.              No-   change in color of mucus.  No- wheezing.   Skin: No-   rash or lesions. GI:  No-   heartburn, indigestion, abdominal pain, nausea, vomiting,  GU:  MS:  No-   joint pain or swelling.   Neuro- nothing unusual Psych:  No- change in mood or affect. No depression or anxiety.  No memory loss.   Objective:   Physical Exam General- Alert, Oriented, Affect-appropriate, Distress- none acute  overweight Skin- rash-none, lesions- none, excoriation- none Lymphadenopathy- none Head- atraumatic      Eyes- Gross vision intact, PERRLA, conjunctivae clear secretions            Ears- Hearing, canals slightly retracted, no fluid            Nose- Clear, No-Septal dev, mucus, polyps, erosion, perforation             Throat- Mallampati III , mucosa clear- not red , drainage- none, tonsils- atrophic, +hoarse Neck- flexible , trachea midline, no stridor , thyroid nl, carotid no bruit Chest - symmetrical excursion , unlabored           Heart/CV- RRR , no murmur , no gallop  , no rub, nl s1 s2                           - JVD- none , edema- none, stasis changes- none, varices- none           Lung- clear to P&A, wheeze- none, cough- none , dullness-none, rub- none           Chest wall-  Abd-  Br/ Gen/ Rectal- Not done, not indicated Extrem- cyanosis- none, clubbing, none, atrophy- none, strength- nl Neuro- grossly intact to observation   Assessment & Plan:

## 2012-07-25 NOTE — Patient Instructions (Addendum)
Script [employer]- short acting sleep med that you can use occasionally if you wake in the middle of the night.  For the sore throat and drainage, try  otc antihistamine loratadine/ Claritin  Sample steroid nasal spray Qnasl or Omnaris     Try 1 or 2 sprays each nostril every night at bedtime  Try elevating the head end of your bed with a brick under each of the head legs, to keep stomach acid down hill.

## 2012-08-01 NOTE — Assessment & Plan Note (Signed)
Recent exacerbation. Plan-Claritin, sample nasal steroid for trial

## 2012-08-01 NOTE — Assessment & Plan Note (Addendum)
Good compliance and control Occasional insomnia, nonspecific. Can try [employer]

## 2012-08-13 LAB — HM PAP SMEAR: HM Pap smear: ABNORMAL

## 2012-08-25 ENCOUNTER — Encounter: Payer: Self-pay | Admitting: Internal Medicine

## 2012-08-25 ENCOUNTER — Ambulatory Visit (INDEPENDENT_AMBULATORY_CARE_PROVIDER_SITE_OTHER): Payer: Medicare Other | Admitting: Internal Medicine

## 2012-08-25 ENCOUNTER — Ambulatory Visit (INDEPENDENT_AMBULATORY_CARE_PROVIDER_SITE_OTHER)
Admission: RE | Admit: 2012-08-25 | Discharge: 2012-08-25 | Disposition: A | Discharge status: Home / Self Care | Payer: Medicare Other | Source: Ambulatory Visit | Attending: Internal Medicine | Admitting: Internal Medicine

## 2012-08-25 VITALS — BP 120/76 | HR 85 | Ht 62.0 in | Wt 214.8 lb

## 2012-08-25 DIAGNOSIS — R05 Cough: Secondary | ICD-10-CM

## 2012-08-25 DIAGNOSIS — G4733 Obstructive sleep apnea (adult) (pediatric): Secondary | ICD-10-CM

## 2012-08-25 DIAGNOSIS — R059 Cough, unspecified: Secondary | ICD-10-CM

## 2012-08-25 MED ORDER — TRAMADOL HCL (ER BIPHASIC) 200 MG PO TB24: 1.0000 | ORAL_TABLET | Freq: Three times a day (TID) | ORAL | Status: DC | PRN

## 2012-08-25 NOTE — Progress Notes (Signed)
Subjective:    Patient ID: Jackie Stevens, female    DOB: [birth year], 66 y.o.   MRN: [id number]  HPI 07/26/11- followed for obstructive sleep apnea complicated by GERD, HBP, allergic rhinitis Last here 01/25/2011 Pending stress test for chest pain.  CPAP 10 Advanced- all night every night. It bothers her enough that she likes to leave the mask off after getting up for bathroom. Husband will tell her that she snores without it. We discussed comfort issues. Mostly she just doesn't like having something on her face.  07/25/12- 65 yoF never smoker followed for obstructive sleep apnea complicated by GERD, HBP, allergic rhinitis Wears CPAP10/Advanced  every night-except when having trouble with sleeping overall.  New complaint-sore throat x5 days. Ears itch without popping. Some watery nose and occasional sneeze with dry cough.  08/25/12- 65 yoF never smoker followed for obstructive sleep apnea complicated by GERD, HBP, allergic rhinitis ACUTE VISIT:dry cough-wheezing. Choke and gag wake her at night. Does not recognize indigestion or reflux. Codeine cough syrup lets her sleep through but does not stop the coughing according to her family. Persistent postnasal drip. Head of bed is elevated on brick. CPAP 10/Advanced is being used but cough interferes. Discussed history of thyroid cancer resected and treated with radioactive iodine 28 years ago.  Review of Systems-see HPI Constitutional:   No-   weight loss, night sweats, fevers, chills, fatigue, lassitude. HEENT:   No-  headaches, difficulty swallowing, tooth/dental problems, sore throat,         + sneezing, itching, ear ache,+ nasal congestion, post nasal drip,  CV:  No-   chest pain, orthopnea, PND, swelling in lower extremities, anasarca, dizziness, palpitations Resp: No-   shortness of breath with exertion or at rest.              No-   productive cough,  + non-productive cough,  No-  coughing up of blood.              No-   change in color of  mucus.  No- wheezing.   Skin: No-   rash or lesions. GI:  No-   heartburn, indigestion, abdominal pain, nausea, vomiting,  GU:  MS:  No-   joint pain or swelling.   Neuro- nothing unusual Psych:  No- change in mood or affect. No depression or anxiety.  No memory loss.   Objective:   Physical Exam General- Alert, Oriented, Affect-appropriate, Distress- none acute  overweight Skin- rash-none, lesions- none, excoriation- none Lymphadenopathy- none Head- atraumatic            Eyes- Gross vision intact, PERRLA, conjunctivae clear secretions            Ears- Hearing, canals slightly retracted, no fluid            Nose- Clear, No-Septal dev, mucus, polyps, erosion, perforation             Throat- Mallampati III , mucosa clear- not red , drainage- none, tonsils- atrophic, not hoarse Neck- flexible , trachea midline, no stridor , thyroid nl, carotid no bruit Chest - symmetrical excursion , unlabored           Heart/CV- RRR , no murmur , no gallop  , no rub, nl s1 s2                           - JVD- none , edema- none, stasis changes- none, varices- none  Lung- clear to P&A, wheeze- none, cough- none , dullness-none, rub- none           Chest wall-  Abd-  Br/ Gen/ Rectal- Not done, not indicated Extrem- cyanosis- none, clubbing, none, atrophy- none, strength- nl Neuro- grossly intact to observation   Assessment & Plan:

## 2012-08-25 NOTE — Patient Instructions (Addendum)
Script for tramadol - try taking one about 20 minutes before bedtime to suppress cough    Script sent  Take the prilosec before breakfast and before supper  Order CXR- dx cough  Sample Dymista- use instead of your current nasal sprays.   1-2 puffs each nostril, every night at bedtime. This may build over a week or two.

## 2012-08-29 ENCOUNTER — Telehealth: Payer: Self-pay | Admitting: Internal Medicine

## 2012-08-29 NOTE — Telephone Encounter (Signed)
Pt advised of results as follows: Notes Recorded by Waymon Budge, MD on 08/26/2012 at 2:56 PM CXR - airway thickening probably from chronic bronchitis, no new problem. Carron Curie, CMA

## 2012-09-02 DIAGNOSIS — R059 Cough, unspecified: Secondary | ICD-10-CM | POA: Insufficient documentation

## 2012-09-02 DIAGNOSIS — R05 Cough: Secondary | ICD-10-CM | POA: Insufficient documentation

## 2012-09-02 NOTE — Assessment & Plan Note (Signed)
Basically compliance and control but cough is beginning to get in the way.

## 2012-09-02 NOTE — Assessment & Plan Note (Signed)
Suspect reflux but postnasal drainage may be important. Plan-tramadol suppress cough, chest x-ray, taking Prilosec before supper and before breakfast. Sample Dymista.

## 2012-10-25 ENCOUNTER — Ambulatory Visit (HOSPITAL_BASED_OUTPATIENT_CLINIC_OR_DEPARTMENT_OTHER)
Admission: RE | Admit: 2012-10-25 | Discharge: 2012-10-25 | Disposition: A | Discharge status: Home / Self Care | Payer: Medicare Other | Source: Ambulatory Visit | Attending: Family Medicine | Admitting: Family Medicine

## 2012-10-25 ENCOUNTER — Encounter: Payer: Self-pay | Admitting: Family Medicine

## 2012-10-25 ENCOUNTER — Ambulatory Visit (INDEPENDENT_AMBULATORY_CARE_PROVIDER_SITE_OTHER): Payer: Medicare Other | Admitting: Family Medicine

## 2012-10-25 VITALS — BP 120/76 | HR 92 | Temp 98.2°F | Ht 62.0 in | Wt 204.8 lb

## 2012-10-25 DIAGNOSIS — R110 Nausea: Secondary | ICD-10-CM

## 2012-10-25 DIAGNOSIS — R109 Unspecified abdominal pain: Secondary | ICD-10-CM | POA: Insufficient documentation

## 2012-10-25 DIAGNOSIS — R8290 Unspecified abnormal findings in urine: Secondary | ICD-10-CM

## 2012-10-25 DIAGNOSIS — E039 Hypothyroidism, unspecified: Secondary | ICD-10-CM | POA: Insufficient documentation

## 2012-10-25 DIAGNOSIS — E785 Hyperlipidemia, unspecified: Secondary | ICD-10-CM

## 2012-10-25 DIAGNOSIS — G4700 Insomnia, unspecified: Secondary | ICD-10-CM | POA: Insufficient documentation

## 2012-10-25 DIAGNOSIS — R82998 Other abnormal findings in urine: Secondary | ICD-10-CM

## 2012-10-25 DIAGNOSIS — R630 Anorexia: Secondary | ICD-10-CM | POA: Insufficient documentation

## 2012-10-25 DIAGNOSIS — E079 Disorder of thyroid, unspecified: Secondary | ICD-10-CM

## 2012-10-25 DIAGNOSIS — R87619 Unspecified abnormal cytological findings in specimens from cervix uteri: Secondary | ICD-10-CM | POA: Insufficient documentation

## 2012-10-25 DIAGNOSIS — G4733 Obstructive sleep apnea (adult) (pediatric): Secondary | ICD-10-CM

## 2012-10-25 DIAGNOSIS — I10 Essential (primary) hypertension: Secondary | ICD-10-CM

## 2012-10-25 DIAGNOSIS — K5900 Constipation, unspecified: Secondary | ICD-10-CM

## 2012-10-25 DIAGNOSIS — C801 Malignant (primary) neoplasm, unspecified: Secondary | ICD-10-CM | POA: Insufficient documentation

## 2012-10-25 DIAGNOSIS — R634 Abnormal weight loss: Secondary | ICD-10-CM | POA: Insufficient documentation

## 2012-10-25 DIAGNOSIS — K5792 Diverticulitis of intestine, part unspecified, without perforation or abscess without bleeding: Secondary | ICD-10-CM

## 2012-10-25 DIAGNOSIS — K5732 Diverticulitis of large intestine without perforation or abscess without bleeding: Secondary | ICD-10-CM

## 2012-10-25 HISTORY — DX: Unspecified abnormal cytological findings in specimens from cervix uteri: R87.619

## 2012-10-25 HISTORY — DX: Diverticulitis of intestine, part unspecified, without perforation or abscess without bleeding: K57.92

## 2012-10-25 LAB — HEPATIC FUNCTION PANEL
ALT: 54 U/L — ABNORMAL HIGH (ref 0–35)
AST: 28 U/L (ref 0–37)
Albumin: 3.1 g/dL — ABNORMAL LOW (ref 3.5–5.2)
Alkaline Phosphatase: 136 U/L — ABNORMAL HIGH (ref 39–117)
Bilirubin, Direct: 0 mg/dL (ref 0.0–0.3)
Total Bilirubin: 0.3 mg/dL (ref 0.3–1.2)
Total Protein: 7.3 g/dL (ref 6.0–8.3)

## 2012-10-25 LAB — POCT URINALYSIS DIPSTICK
Blood, UA: NEGATIVE
Glucose, UA: NEGATIVE
Leukocytes, UA: NEGATIVE
Nitrite, UA: NEGATIVE
Protein, UA: 30
Spec Grav, UA: 1.02
Urobilinogen, UA: 0.2
pH, UA: 5

## 2012-10-25 LAB — RENAL FUNCTION PANEL
Albumin: 3.1 g/dL — ABNORMAL LOW (ref 3.5–5.2)
BUN: 19 mg/dL (ref 6–23)
CO2: 28 mEq/L (ref 19–32)
Calcium: 8.5 mg/dL (ref 8.4–10.5)
Chloride: 89 mEq/L — ABNORMAL LOW (ref 96–112)
Creatinine, Ser: 1.1 mg/dL (ref 0.4–1.2)
GFR: 54.17 mL/min — ABNORMAL LOW (ref >60.00)
Glucose, Bld: 104 mg/dL — ABNORMAL HIGH (ref 70–99)
Phosphorus: 3.8 mg/dL (ref 2.3–4.6)
Potassium: 4.2 mEq/L (ref 3.5–5.1)
Sodium: 127 mEq/L — ABNORMAL LOW (ref 135–145)

## 2012-10-25 LAB — CBC
HCT: 36.6 % (ref 36.0–46.0)
Hemoglobin: 11.7 g/dL — ABNORMAL LOW (ref 12.0–15.0)
MCHC: 31.9 g/dL (ref 30.0–36.0)
MCV: 90.8 fl (ref 78.0–100.0)
Platelets: 416 10*3/uL — ABNORMAL HIGH (ref 150.0–400.0)
RBC: 4.03 Mil/uL (ref 3.87–5.11)
RDW: 13.6 % (ref 11.5–14.6)
WBC: 18.5 10*3/uL (ref 4.5–10.5)

## 2012-10-25 MED ORDER — METRONIDAZOLE 500 MG PO TABS: 500.0000 mg | ORAL_TABLET | Freq: Three times a day (TID) | ORAL | Status: DC

## 2012-10-25 MED ORDER — PROMETHAZINE HCL 25 MG PO TABS: 25.0000 mg | ORAL_TABLET | Freq: Three times a day (TID) | ORAL | Status: DC | PRN

## 2012-10-25 MED ORDER — CIPROFLOXACIN HCL 500 MG PO TABS: 500.0000 mg | ORAL_TABLET | Freq: Two times a day (BID) | ORAL | Status: DC

## 2012-10-25 NOTE — Assessment & Plan Note (Signed)
Avoid trans fats, tolerating Simvastatin, recheck lipid panel next week.

## 2012-10-25 NOTE — Assessment & Plan Note (Addendum)
Now hypothyroid s/p treatment for thyroid cancer years ago. Follows with endocrinology. Has an appt this month

## 2012-10-25 NOTE — Assessment & Plan Note (Signed)
Well controlled no changes 

## 2012-10-25 NOTE — Assessment & Plan Note (Signed)
Abdominal xray did not reveal significant any significant stool no new meds at this time. Will add a probiotic daily

## 2012-10-25 NOTE — Assessment & Plan Note (Signed)
Sleeping well at this time, does not need Sonata much at this time.

## 2012-10-25 NOTE — Assessment & Plan Note (Signed)
Has been placed on Estrace for 6 months and then they are going to repeat pap

## 2012-10-25 NOTE — Progress Notes (Signed)
Patient ID: Jackie Stevens, female   DOB: [birth year], 66 y.o.   MRN: [id number] CHARLEEN MADERA [id number] 11-Feb-1946 10/25/2012      Progress Note New Patient  Subjective  Chief Complaint  Chief Complaint  Patient presents with  . Establish Care    new patient    HPI  Patient is a 66 year old Caucasian female who is in today for new patient appointment. She scheduled the appointment do to a one-week history of sudden onset constipation with abdominal pain. She says she's tried multiple things including MiraLax, Metamucil and has had trouble getting her bowels to move. She notes normally her bowels move 3-4 times a day and lately she's had trouble getting her bowels to move at all. She had a lot of abdominal cramping as well as some low-grade fevers and chills. Her appetite is diminished but she is eating some. She struggles with persistent nausea did have 2 episodes of vomiting yesterday although that is a new development. No bloody or tarry stool. No chest pain or palpitations. She has been struggling with a chronic cough for quite some time but the addition of Prilosec to treat heartburn seems to help tremendously for she notes she has a lot of pelvic pressure and feels discomfort trying to urinate more even pass gas at this time. Allergies does relatively well at this time. Follows with endocrinology for her hypothyroidism a history of cancer.  Past Medical History  Diagnosis Date  . Allergic rhinitis   . HTN (hypertension)   . OSA (obstructive sleep apnea)   . GERD (gastroesophageal reflux disease)   . History of thyroid cancer   . Hyperlipidemia   . Chicken pox as a child  . Measles as a child  . Cancer 1980's    thyroid  . Insomnia   . Thyroid disease   . Constipation   . Diverticulitis 10/25/2012    Past Surgical History  Procedure Date  . Thyroidectomy, partial 1988  . Tonsillectomy as a kid  . Total abdominal hysterectomy 70's  . Cholecystectomy 1990    Family  History  Problem Relation Age of Onset  . Heart disease Father   . Pneumonia Father   . Hypertension Father   . Hyperlipidemia Father   . Cancer Father     skin  . Stroke Father   . Cancer Paternal Aunt   . Alzheimer's disease Mother   . Heart disease Mother   . Emphysema Brother     marijuana and cigarettes  . Alcohol abuse Brother   . Hearing loss Brother   . Diabetes Maternal Grandmother   . Alzheimer's disease Paternal Grandmother   . Cancer Paternal Grandmother     lung?- smoker  . Hyperlipidemia Paternal Grandmother   . Heart attack Paternal Grandfather   . Alcohol abuse Paternal Grandfather     History   Social History  . Marital Status: Married    Spouse Name: N/A    Number of Children: N/A  . Years of Education: N/A   Occupational History  . Not on file.   Social History Main Topics  . Smoking status: Never Smoker   . Smokeless tobacco: Never Used  . Alcohol Use: Yes     Comment: occasional  . Drug Use: No  . Sexually Active: Not Currently   Other Topics Concern  . Not on file   Social History Narrative   Married children    Current Outpatient Prescriptions on File Prior to Visit  Medication Sig  Dispense Refill  . aspirin 81 MG tablet Take 81 mg by mouth daily.        Marland Kitchen etodolac (LODINE) 400 MG tablet Take 400 mg by mouth 2 (two) times daily.        Marland Kitchen levothyroxine (SYNTHROID, LEVOTHROID) 150 MCG tablet 1 1/2  tabs once a week      . metoCLOPramide (REGLAN) 10 MG tablet Take 10 mg by mouth as needed.       . Omega-3 Fatty Acids (FISH OIL) 1200 MG CAPS Take 1 capsule by mouth daily.        Marland Kitchen omeprazole (PRILOSEC) 20 MG capsule Take 20 mg by mouth 2 (two) times daily.       . Psyllium-Calcium (METAMUCIL PLUS CALCIUM PO) Take 1 tablet by mouth daily.       . simvastatin (ZOCOR) 40 MG tablet Take 40 mg by mouth at bedtime.        . TraMADol HCl 200 MG TB24 Take 1 tablet by mouth every 8 (eight) hours as needed (cough).  30 tablet  3  .  valsartan-hydrochlorothiazide (DIOVAN-HCT) 320-25 MG per tablet Take 1 tablet by mouth daily.  90 tablet  3  . NITROSTAT 0.4 MG SL tablet Place 0.4 mg under the tongue every 5 (five) minutes as needed.       . promethazine (PHENERGAN) 25 MG tablet Take 1 tablet (25 mg total) by mouth every 8 (eight) hours as needed for nausea.  20 tablet  0  . vitamin B-12 (CYANOCOBALAMIN) 1000 MCG tablet Take 1,000 mcg by mouth daily.        . zaleplon (SONATA) 10 MG capsule As needed for sleep if you plan to sleep at least 3 more hours  30 capsule  5    Allergies  Allergen Reactions  . Neomycin-Bacitracin Zn-Polymyx   . Niacin     Review of Systems  Review of Systems  Constitutional: Positive for fever, chills and malaise/fatigue.  HENT: Negative for hearing loss, nosebleeds and congestion.   Eyes: Negative for discharge.  Respiratory: Negative for cough, sputum production, shortness of breath and wheezing.   Cardiovascular: Negative for chest pain, palpitations and leg swelling.  Gastrointestinal: Positive for heartburn, nausea, vomiting, abdominal pain, diarrhea and constipation. Negative for blood in stool.  Genitourinary: Negative for dysuria, urgency, frequency and hematuria.  Musculoskeletal: Negative for myalgias, back pain and falls.  Skin: Negative for rash.  Neurological: Negative for dizziness, tremors, sensory change, focal weakness, loss of consciousness, weakness and headaches.  Endo/Heme/Allergies: Negative for polydipsia. Does not bruise/bleed easily.  Psychiatric/Behavioral: Negative for depression and suicidal ideas. The patient is not nervous/anxious and does not have insomnia.     Objective  BP 120/76  Pulse 92  Temp 98.2 F (36.8 C) (Temporal)  Ht 5\' 2"  (1.575 m)  Wt 204 lb 12.8 oz (92.897 kg)  BMI 37.46 kg/m2  SpO2 98%  Physical Exam  Physical Exam  Constitutional: She is oriented to person, place, and time and well-developed, well-nourished, and in no distress. No  distress.  HENT:  Head: Normocephalic and atraumatic.  Right Ear: External ear normal.  Left Ear: External ear normal.  Nose: Nose normal.  Mouth/Throat: Oropharynx is clear and moist. No oropharyngeal exudate.  Eyes: Conjunctivae normal are normal. Pupils are equal, round, and reactive to light. Right eye exhibits no discharge. Left eye exhibits no discharge. No scleral icterus.  Neck: Normal range of motion. Neck supple. No thyromegaly present.  Cardiovascular: Normal rate, regular rhythm,  normal heart sounds and intact distal pulses.   No murmur heard. Pulmonary/Chest: Effort normal and breath sounds normal. No respiratory distress. She has no wheezes. She has no rales.  Abdominal: Soft. Bowel sounds are normal. She exhibits no distension and no mass. There is tenderness. There is no rebound and no guarding.       Mild tender to palp diffusely  Musculoskeletal: Normal range of motion. She exhibits no edema and no tenderness.  Lymphadenopathy:    She has no cervical adenopathy.  Neurological: She is alert and oriented to person, place, and time. She has normal reflexes. No cranial nerve deficit. Coordination normal.  Skin: Skin is warm and dry. No rash noted. She is not diaphoretic.  Psychiatric: Mood, memory and affect normal.       Assessment & Plan  HYPERTENSION Well controlled no changes.   OBSTRUCTIVE SLEEP APNEA Follows with Dr Maple Hudson of [place]  Diverticulitis Spoke with patient by phone, she felt nauseous upon arriving home so she took  Phenergan and had been slept most of the day. She is not feeling any worse than she did in the office. Explained her labs to her. She is advised to seek immediate medical care over night if her pain worsening, she will maintain a clear liquid diet and simple carbs over night and she is started on Ciprofloxacin and Flagyl to address possible Diverticulitis. A CT abdomen will be arranged for the am. She will use electrolyte substitute  beverages such as Gatorade over night. Patient expresses understanding.  Constipation Abdominal xray did not reveal significant any significant stool no new meds at this time. Will add a probiotic daily  Insomnia Sleeping well at this time, does not need Sonata much at this time.  Thyroid disease Now hypothyroid s/p treatment for thyroid cancer years ago. Follows with endocrinology. Has an appt this month  HYPERLIPIDEMIA Avoid trans fats, tolerating Simvastatin, recheck lipid panel next week.  Abnormal cervical cytology Has been placed on Estrace for 6 months and then they are going to repeat pap

## 2012-10-25 NOTE — Patient Instructions (Signed)
[facility] probiotic gummies by [employer]  For the constipation try 2 tbls of Milk of Magnesium in 4 oz of warm prune juice and a Dulcolax suppository and repeat in 4 hours  And can repeat again if no results. If no results after that return here or try an enema.  Preventive Care for Adults, Female A healthy lifestyle and preventive care can promote health and wellness. Preventive health guidelines for women include the following key practices.  A routine yearly physical is a good way to check with your caregiver about your health and preventive screening. It is a chance to share any concerns and updates on your health, and to receive a thorough exam.  Visit your dentist for a routine exam and preventive care every 6 months. Brush your teeth twice a day and floss once a day. Good oral hygiene prevents tooth decay and gum disease.  The frequency of eye exams is based on your age, health, family medical history, use of contact lenses, and other factors. Follow your caregiver's recommendations for frequency of eye exams.  Eat a healthy diet. Foods like vegetables, fruits, whole grains, low-fat dairy products, and lean protein foods contain the nutrients you need without too many calories. Decrease your intake of foods high in solid fats, added sugars, and salt. Eat the right amount of calories for you.Get information about a proper diet from your caregiver, if necessary.  Regular physical exercise is one of the most important things you can do for your health. Most adults should get at least 150 minutes of moderate-intensity exercise (any activity that increases your heart rate and causes you to sweat) each week. In addition, most adults need muscle-strengthening exercises on 2 or more days a week.  Maintain a healthy weight. The body mass index (BMI) is a screening tool to identify possible weight problems. It provides an estimate of body fat based on height and weight. Your caregiver can  help determine your BMI, and can help you achieve or maintain a healthy weight.For adults 20 years and older:  A BMI below 18.5 is considered underweight.  A BMI of 18.5 to 24.9 is normal.  A BMI of 25 to 29.9 is considered overweight.  A BMI of 30 and above is considered obese.  Maintain normal blood lipids and cholesterol levels by exercising and minimizing your intake of saturated fat. Eat a balanced diet with plenty of fruit and vegetables. Blood tests for lipids and cholesterol should begin at age 41 and be repeated every 5 years. If your lipid or cholesterol levels are high, you are over 50, or you are at high risk for heart disease, you may need your cholesterol levels checked more frequently.Ongoing high lipid and cholesterol levels should be treated with medicines if diet and exercise are not effective.  If you smoke, find out from your caregiver how to quit. If you do not use tobacco, do not start.  If you are pregnant, do not drink alcohol. If you are breastfeeding, be very cautious about drinking alcohol. If you are not pregnant and choose to drink alcohol, do not exceed 1 drink per day. One drink is considered to be 12 ounces (355 mL) of beer, 5 ounces (148 mL) of wine, or 1.5 ounces (44 mL) of liquor.  Avoid use of street drugs. Do not share needles with anyone. Ask for help if you need support or instructions about stopping the use of drugs.  High blood pressure causes heart disease and increases the risk  of stroke. Your blood pressure should be checked at least every 1 to 2 years. Ongoing high blood pressure should be treated with medicines if weight loss and exercise are not effective.  If you are 11 to 68 years old, ask your caregiver if you should take aspirin to prevent strokes.  Diabetes screening involves taking a blood sample to check your fasting blood sugar level. This should be done once every 3 years, after age 46, if you are within normal weight and without risk  factors for diabetes. Testing should be considered at a younger age or be carried out more frequently if you are overweight and have at least 1 risk factor for diabetes.  Breast cancer screening is essential preventive care for women. You should practice "breast self-awareness." This means understanding the normal appearance and feel of your breasts and may include breast self-examination. Any changes detected, no matter how small, should be reported to a caregiver. Women in their 13s and 30s should have a clinical breast exam (CBE) by a caregiver as part of a regular health exam every 1 to 3 years. After age 35, women should have a CBE every year. Starting at age 62, women should consider having a mammography (breast X-ray test) every year. Women who have a family history of breast cancer should talk to their caregiver about genetic screening. Women at a high risk of breast cancer should talk to their caregivers about having magnetic resonance imaging (MRI) and a mammography every year.  The Pap test is a screening test for cervical cancer. A Pap test can show cell changes on the cervix that might become cervical cancer if left untreated. A Pap test is a procedure in which cells are obtained and examined from the lower end of the uterus (cervix).  Women should have a Pap test starting at age 54.  Between ages 64 and 20, Pap tests should be repeated every 2 years.  Beginning at age 103, you should have a Pap test every 3 years as long as the past 3 Pap tests have been normal.  Some women have medical problems that increase the chance of getting cervical cancer. Talk to your caregiver about these problems. It is especially important to talk to your caregiver if a new problem develops soon after your last Pap test. In these cases, your caregiver may recommend more frequent screening and Pap tests.  The above recommendations are the same for women who have or have not gotten the vaccine for human  papillomavirus (HPV).  If you had a hysterectomy for a problem that was not cancer or a condition that could lead to cancer, then you no longer need Pap tests. Even if you no longer need a Pap test, a regular exam is a good idea to make sure no other problems are starting.  If you are between ages 2 and 69, and you have had normal Pap tests going back 10 years, you no longer need Pap tests. Even if you no longer need a Pap test, a regular exam is a good idea to make sure no other problems are starting.  If you have had past treatment for cervical cancer or a condition that could lead to cancer, you need Pap tests and screening for cancer for at least 20 years after your treatment.  If Pap tests have been discontinued, risk factors (such as a new sexual partner) need to be reassessed to determine if screening should be resumed.  The HPV test is an  additional test that may be used for cervical cancer screening. The HPV test looks for the virus that can cause the cell changes on the cervix. The cells collected during the Pap test can be tested for HPV. The HPV test could be used to screen women aged 45 years and older, and should be used in women of any age who have unclear Pap test results. After the age of 61, women should have HPV testing at the same frequency as a Pap test.  Colorectal cancer can be detected and often prevented. Most routine colorectal cancer screening begins at the age of 82 and continues through age 63. However, your caregiver may recommend screening at an earlier age if you have risk factors for colon cancer. On a yearly basis, your caregiver may provide home test kits to check for hidden blood in the stool. Use of a small camera at the end of a tube, to directly examine the colon (sigmoidoscopy or colonoscopy), can detect the earliest forms of colorectal cancer. Talk to your caregiver about this at age 74, when routine screening begins. Direct examination of the colon should be  repeated every 5 to 10 years through age 2, unless early forms of pre-cancerous polyps or small growths are found.  Hepatitis C blood testing is recommended for all people born from [birth year] through 1965 and any individual with known risks for hepatitis C.  Practice safe sex. Use condoms and avoid high-risk sexual practices to reduce the spread of sexually transmitted infections (STIs). STIs include gonorrhea, chlamydia, syphilis, trichomonas, herpes, HPV, and human immunodeficiency virus (HIV). Herpes, HIV, and HPV are viral illnesses that have no cure. They can result in disability, cancer, and death. Sexually active women aged 34 and younger should be checked for chlamydia. Older women with new or multiple partners should also be tested for chlamydia. Testing for other STIs is recommended if you are sexually active and at increased risk.  Osteoporosis is a disease in which the bones lose minerals and strength with aging. This can result in serious bone fractures. The risk of osteoporosis can be identified using a bone density scan. Women ages 72 and over and women at risk for fractures or osteoporosis should discuss screening with their caregivers. Ask your caregiver whether you should take a calcium supplement or vitamin D to reduce the rate of osteoporosis.  Menopause can be associated with physical symptoms and risks. Hormone replacement therapy is available to decrease symptoms and risks. You should talk to your caregiver about whether hormone replacement therapy is right for you.  Use sunscreen with sun protection factor (SPF) of 30 or more. Apply sunscreen liberally and repeatedly throughout the day. You should seek shade when your shadow is shorter than you. Protect yourself by wearing long sleeves, pants, a wide-brimmed hat, and sunglasses year round, whenever you are outdoors.  Once a month, do a whole body skin exam, using a mirror to look at the skin on your back. Notify your caregiver of new  moles, moles that have irregular borders, moles that are larger than a pencil eraser, or moles that have changed in shape or color.  Stay current with required immunizations.  Influenza. You need a dose every fall (or winter). The composition of the flu vaccine changes each year, so being vaccinated once is not enough.  Pneumococcal polysaccharide. You need 1 to 2 doses if you smoke cigarettes or if you have certain chronic medical conditions. You need 1 dose at age 24 (or older) if  you have never been vaccinated.  Tetanus, diphtheria, pertussis (Tdap, Td). Get 1 dose of Tdap vaccine if you are younger than age 37, are over 21 and have contact with an infant, are a Research scientist (physical sciences), are pregnant, or simply want to be protected from whooping cough. After that, you need a Td booster dose every 10 years. Consult your caregiver if you have not had at least 3 tetanus and diphtheria-containing shots sometime in your life or have a deep or dirty wound.  HPV. You need this vaccine if you are a woman age 50 or younger. The vaccine is given in 3 doses over 6 months.  Measles, mumps, rubella (MMR). You need at least 1 dose of MMR if you were born in 1957 or later. You may also need a second dose.  Meningococcal. If you are age 30 to 24 and a first-year college student living in a residence hall, or have one of several medical conditions, you need to get vaccinated against meningococcal disease. You may also need additional booster doses.  Zoster (shingles). If you are age 61 or older, you should get this vaccine.  Varicella (chickenpox). If you have never had chickenpox or you were vaccinated but received only 1 dose, talk to your caregiver to find out if you need this vaccine.  Hepatitis A. You need this vaccine if you have a specific risk factor for hepatitis A virus infection or you simply wish to be protected from this disease. The vaccine is usually given as 2 doses, 6 to 18 months apart.  Hepatitis  B. You need this vaccine if you have a specific risk factor for hepatitis B virus infection or you simply wish to be protected from this disease. The vaccine is given in 3 doses, usually over 6 months. Preventive Services / Frequency Ages 23 to 18  Blood pressure check.** / Every 1 to 2 years.  Lipid and cholesterol check.** / Every 5 years beginning at age 76.  Clinical breast exam.** / Every 3 years for women in their 29s and 30s.  Pap test.** / Every 2 years from ages 3 through 41. Every 3 years starting at age 101 through age 93 or 75 with a history of 3 consecutive normal Pap tests.  HPV screening.** / Every 3 years from ages 19 through ages 52 to 63 with a history of 3 consecutive normal Pap tests.  Hepatitis C blood test.** / For any individual with known risks for hepatitis C.  Skin self-exam. / Monthly.  Influenza immunization.** / Every year.  Pneumococcal polysaccharide immunization.** / 1 to 2 doses if you smoke cigarettes or if you have certain chronic medical conditions.  Tetanus, diphtheria, pertussis (Tdap, Td) immunization. / A one-time dose of Tdap vaccine. After that, you need a Td booster dose every 10 years.  HPV immunization. / 3 doses over 6 months, if you are 69 and younger.  Measles, mumps, rubella (MMR) immunization. / You need at least 1 dose of MMR if you were born in 1957 or later. You may also need a second dose.  Meningococcal immunization. / 1 dose if you are age 25 to 67 and a first-year college student living in a residence hall, or have one of several medical conditions, you need to get vaccinated against meningococcal disease. You may also need additional booster doses.  Varicella immunization.** / Consult your caregiver.  Hepatitis A immunization.** / Consult your caregiver. 2 doses, 6 to 18 months apart.  Hepatitis B immunization.** / Consult  your caregiver. 3 doses usually over 6 months. Ages 54 to 9  Blood pressure check.** / Every 1 to 2  years.  Lipid and cholesterol check.** / Every 5 years beginning at age 61.  Clinical breast exam.** / Every year after age 67.  Mammogram.** / Every year beginning at age 40 and continuing for as long as you are in good health. Consult with your caregiver.  Pap test.** / Every 3 years starting at age 89 through age 32 or 44 with a history of 3 consecutive normal Pap tests.  HPV screening.** / Every 3 years from ages 6 through ages 57 to 63 with a history of 3 consecutive normal Pap tests.  Fecal occult blood test (FOBT) of stool. / Every year beginning at age 76 and continuing until age 55. You may not need to do this test if you get a colonoscopy every 10 years.  Flexible sigmoidoscopy or colonoscopy.** / Every 5 years for a flexible sigmoidoscopy or every 10 years for a colonoscopy beginning at age 27 and continuing until age 69.  Hepatitis C blood test.** / For all people born from [birth year] through 1967 and any individual with known risks for hepatitis C.  Skin self-exam. / Monthly.  Influenza immunization.** / Every year.  Pneumococcal polysaccharide immunization.** / 1 to 2 doses if you smoke cigarettes or if you have certain chronic medical conditions.  Tetanus, diphtheria, pertussis (Tdap, Td) immunization.** / A one-time dose of Tdap vaccine. After that, you need a Td booster dose every 10 years.  Measles, mumps, rubella (MMR) immunization. / You need at least 1 dose of MMR if you were born in 1957 or later. You may also need a second dose.  Varicella immunization.** / Consult your caregiver.  Meningococcal immunization.** / Consult your caregiver.  Hepatitis A immunization.** / Consult your caregiver. 2 doses, 6 to 18 months apart.  Hepatitis B immunization.** / Consult your caregiver. 3 doses, usually over 6 months. Ages 33 and over  Blood pressure check.** / Every 1 to 2 years.  Lipid and cholesterol check.** / Every 5 years beginning at age 86.  Clinical breast  exam.** / Every year after age 95.  Mammogram.** / Every year beginning at age 43 and continuing for as long as you are in good health. Consult with your caregiver.  Pap test.** / Every 3 years starting at age 15 through age 43 or 2 with a 3 consecutive normal Pap tests. Testing can be stopped between 65 and 70 with 3 consecutive normal Pap tests and no abnormal Pap or HPV tests in the past 10 years.  HPV screening.** / Every 3 years from ages 64 through ages 16 or 73 with a history of 3 consecutive normal Pap tests. Testing can be stopped between 65 and 70 with 3 consecutive normal Pap tests and no abnormal Pap or HPV tests in the past 10 years.  Fecal occult blood test (FOBT) of stool. / Every year beginning at age 54 and continuing until age 2. You may not need to do this test if you get a colonoscopy every 10 years.  Flexible sigmoidoscopy or colonoscopy.** / Every 5 years for a flexible sigmoidoscopy or every 10 years for a colonoscopy beginning at age 18 and continuing until age 76.  Hepatitis C blood test.** / For all people born from [birth year] through 1967 and any individual with known risks for hepatitis C.  Osteoporosis screening.** / A one-time screening for women ages 64 and over and  women at risk for fractures or osteoporosis.  Skin self-exam. / Monthly.  Influenza immunization.** / Every year.  Pneumococcal polysaccharide immunization.** / 1 dose at age 3 (or older) if you have never been vaccinated.  Tetanus, diphtheria, pertussis (Tdap, Td) immunization. / A one-time dose of Tdap vaccine if you are over 65 and have contact with an infant, are a Research scientist (physical sciences), or simply want to be protected from whooping cough. After that, you need a Td booster dose every 10 years.  Varicella immunization.** / Consult your caregiver.  Meningococcal immunization.** / Consult your caregiver.  Hepatitis A immunization.** / Consult your caregiver. 2 doses, 6 to 18 months apart.  Hepatitis B  immunization.** / Check with your caregiver. 3 doses, usually over 6 months. ** Family history and personal history of risk and conditions may change your caregiver's recommendations. Document Released: 01/25/2002 Document Revised: 02/21/2012 Document Reviewed: 04/26/2011 [facility] Patient Information 2013 [place], [place].

## 2012-10-25 NOTE — Assessment & Plan Note (Addendum)
Follows with Dr Maple Hudson of [facility]

## 2012-10-25 NOTE — Assessment & Plan Note (Signed)
Spoke with patient by phone, she felt nauseous upon arriving home so she took  Phenergan and had been slept most of the day. She is not feeling any worse than she did in the office. Explained her labs to her. She is advised to seek immediate medical care over night if her pain worsening, she will maintain a clear liquid diet and simple carbs over night and she is started on Ciprofloxacin and Flagyl to address possible Diverticulitis. A CT abdomen will be arranged for the am. She will use electrolyte substitute beverages such as Gatorade over night. Patient expresses understanding.

## 2012-10-26 ENCOUNTER — Encounter: Payer: Self-pay | Admitting: Gastroenterology

## 2012-10-26 ENCOUNTER — Encounter (HOSPITAL_BASED_OUTPATIENT_CLINIC_OR_DEPARTMENT_OTHER): Payer: Self-pay

## 2012-10-26 ENCOUNTER — Encounter (HOSPITAL_COMMUNITY): Payer: Self-pay | Admitting: Emergency Medicine

## 2012-10-26 ENCOUNTER — Other Ambulatory Visit: Payer: Self-pay | Admitting: Family Medicine

## 2012-10-26 ENCOUNTER — Inpatient Hospital Stay (HOSPITAL_COMMUNITY)
Admission: EM | Admit: 2012-10-26 | Discharge: 2012-11-03 | DRG: 392 | Disposition: A | Discharge status: Home / Self Care | Payer: Medicare Other | Attending: Internal Medicine | Admitting: Internal Medicine

## 2012-10-26 ENCOUNTER — Ambulatory Visit (HOSPITAL_BASED_OUTPATIENT_CLINIC_OR_DEPARTMENT_OTHER)
Admission: RE | Admit: 2012-10-26 | Discharge: 2012-10-26 | Disposition: A | Discharge status: Home / Self Care | Payer: Medicare Other | Source: Ambulatory Visit | Attending: Family Medicine | Admitting: Family Medicine

## 2012-10-26 DIAGNOSIS — E039 Hypothyroidism, unspecified: Secondary | ICD-10-CM | POA: Diagnosis present

## 2012-10-26 DIAGNOSIS — K5720 Diverticulitis of large intestine with perforation and abscess without bleeding: Secondary | ICD-10-CM

## 2012-10-26 DIAGNOSIS — R112 Nausea with vomiting, unspecified: Secondary | ICD-10-CM | POA: Insufficient documentation

## 2012-10-26 DIAGNOSIS — K5792 Diverticulitis of intestine, part unspecified, without perforation or abscess without bleeding: Secondary | ICD-10-CM

## 2012-10-26 DIAGNOSIS — R110 Nausea: Secondary | ICD-10-CM

## 2012-10-26 DIAGNOSIS — E876 Hypokalemia: Secondary | ICD-10-CM | POA: Diagnosis not present

## 2012-10-26 DIAGNOSIS — K219 Gastro-esophageal reflux disease without esophagitis: Secondary | ICD-10-CM | POA: Diagnosis present

## 2012-10-26 DIAGNOSIS — D72829 Elevated white blood cell count, unspecified: Secondary | ICD-10-CM | POA: Diagnosis present

## 2012-10-26 DIAGNOSIS — K5732 Diverticulitis of large intestine without perforation or abscess without bleeding: Secondary | ICD-10-CM

## 2012-10-26 DIAGNOSIS — R634 Abnormal weight loss: Secondary | ICD-10-CM

## 2012-10-26 DIAGNOSIS — K5900 Constipation, unspecified: Secondary | ICD-10-CM | POA: Diagnosis present

## 2012-10-26 DIAGNOSIS — E871 Hypo-osmolality and hyponatremia: Secondary | ICD-10-CM | POA: Diagnosis not present

## 2012-10-26 DIAGNOSIS — G4733 Obstructive sleep apnea (adult) (pediatric): Secondary | ICD-10-CM | POA: Diagnosis present

## 2012-10-26 DIAGNOSIS — R109 Unspecified abdominal pain: Secondary | ICD-10-CM

## 2012-10-26 DIAGNOSIS — R87619 Unspecified abnormal cytological findings in specimens from cervix uteri: Secondary | ICD-10-CM

## 2012-10-26 DIAGNOSIS — K630 Abscess of intestine: Secondary | ICD-10-CM

## 2012-10-26 DIAGNOSIS — I10 Essential (primary) hypertension: Secondary | ICD-10-CM | POA: Diagnosis present

## 2012-10-26 DIAGNOSIS — E785 Hyperlipidemia, unspecified: Secondary | ICD-10-CM | POA: Diagnosis present

## 2012-10-26 DIAGNOSIS — R630 Anorexia: Secondary | ICD-10-CM | POA: Insufficient documentation

## 2012-10-26 LAB — CBC WITH DIFFERENTIAL/PLATELET
Basophils Absolute: 0 10*3/uL (ref 0.0–0.1)
Basophils Relative: 0 % (ref 0–1)
Eosinophils Absolute: 0 10*3/uL (ref 0.0–0.7)
Eosinophils Relative: 0 % (ref 0–5)
HCT: 31.1 % — ABNORMAL LOW (ref 36.0–46.0)
Hemoglobin: 10.9 g/dL — ABNORMAL LOW (ref 12.0–15.0)
Lymphocytes Relative: 7 % — ABNORMAL LOW (ref 12–46)
Lymphs Abs: 1.4 10*3/uL (ref 0.7–4.0)
MCH: 29.8 pg (ref 26.0–34.0)
MCHC: 35 g/dL (ref 30.0–36.0)
MCV: 85 fL (ref 78.0–100.0)
Monocytes Absolute: 1.9 10*3/uL — ABNORMAL HIGH (ref 0.1–1.0)
Monocytes Relative: 9 % (ref 3–12)
Neutro Abs: 16.9 10*3/uL — ABNORMAL HIGH (ref 1.7–7.7)
Neutrophils Relative %: 84 % — ABNORMAL HIGH (ref 43–77)
Platelets: 372 10*3/uL (ref 150–400)
RBC: 3.66 MIL/uL — ABNORMAL LOW (ref 3.87–5.11)
RDW: 12.9 % (ref 11.5–15.5)
WBC: 20.1 10*3/uL — ABNORMAL HIGH (ref 4.0–10.5)

## 2012-10-26 LAB — URINALYSIS, ROUTINE W REFLEX MICROSCOPIC
Glucose, UA: NEGATIVE mg/dL
Hgb urine dipstick: NEGATIVE
Ketones, ur: 15 mg/dL — AB
Leukocytes, UA: NEGATIVE
Nitrite: NEGATIVE
Protein, ur: NEGATIVE mg/dL
Specific Gravity, Urine: 1.036 — ABNORMAL HIGH (ref 1.005–1.030)
Urobilinogen, UA: 0.2 mg/dL (ref 0.0–1.0)
pH: 6 (ref 5.0–8.0)

## 2012-10-26 LAB — BASIC METABOLIC PANEL
BUN: 13 mg/dL (ref 6–23)
CO2: 24 mEq/L (ref 19–32)
Calcium: 8.2 mg/dL — ABNORMAL LOW (ref 8.4–10.5)
Chloride: 87 mEq/L — ABNORMAL LOW (ref 96–112)
Creatinine, Ser: 0.89 mg/dL (ref 0.50–1.10)
GFR calc Af Amer: 75 mL/min — ABNORMAL LOW (ref >90)
GFR calc non Af Amer: 65 mL/min — ABNORMAL LOW (ref >90)
Glucose, Bld: 106 mg/dL — ABNORMAL HIGH (ref 70–99)
Potassium: 3.6 mEq/L (ref 3.5–5.1)
Sodium: 123 mEq/L — ABNORMAL LOW (ref 135–145)

## 2012-10-26 MED ORDER — ONDANSETRON HCL 4 MG/2ML IJ SOLN
4.0000 mg | Freq: Four times a day (QID) | INTRAMUSCULAR | Status: DC | PRN
  Administered 2012-10-27 – 2012-10-28 (×4): 4 mg via INTRAVENOUS
  Filled 2012-10-26 (×4): qty 2

## 2012-10-26 MED ORDER — ONDANSETRON HCL 4 MG/2ML IJ SOLN: 4.0000 mg | Freq: Three times a day (TID) | INTRAMUSCULAR | Status: DC | PRN

## 2012-10-26 MED ORDER — NITROGLYCERIN 0.4 MG SL SUBL: 0.4000 mg | SUBLINGUAL_TABLET | SUBLINGUAL | Status: DC | PRN

## 2012-10-26 MED ORDER — MORPHINE SULFATE 4 MG/ML IJ SOLN: 4.0000 mg | INTRAMUSCULAR | Status: DC | PRN

## 2012-10-26 MED ORDER — HYDROMORPHONE HCL PF 1 MG/ML IJ SOLN: 1.0000 mg | INTRAMUSCULAR | Status: DC | PRN

## 2012-10-26 MED ORDER — METRONIDAZOLE IN NACL 5-0.79 MG/ML-% IV SOLN
500.0000 mg | Freq: Once | INTRAVENOUS | Status: AC
  Administered 2012-10-26: 500 mg via INTRAVENOUS
  Filled 2012-10-26: qty 100

## 2012-10-26 MED ORDER — HEPARIN SODIUM (PORCINE) 5000 UNIT/ML IJ SOLN
5000.0000 [IU] | Freq: Three times a day (TID) | INTRAMUSCULAR | Status: DC
  Administered 2012-10-28 – 2012-11-03 (×20): 5000 [IU] via SUBCUTANEOUS
  Filled 2012-10-26 (×26): qty 1

## 2012-10-26 MED ORDER — ONDANSETRON HCL 4 MG PO TABS
4.0000 mg | ORAL_TABLET | Freq: Four times a day (QID) | ORAL | Status: DC | PRN
  Administered 2012-10-28: 4 mg via ORAL
  Filled 2012-10-26: qty 1

## 2012-10-26 MED ORDER — DEXTROSE-NACL 5-0.9 % IV SOLN
INTRAVENOUS | Status: DC
  Administered 2012-10-26 – 2012-10-30 (×6): via INTRAVENOUS
  Administered 2012-10-31: 100 mL/h via INTRAVENOUS
  Administered 2012-10-31 – 2012-11-02 (×4): via INTRAVENOUS

## 2012-10-26 MED ORDER — ONDANSETRON HCL 4 MG/2ML IJ SOLN: 4.0000 mg | Freq: Four times a day (QID) | INTRAMUSCULAR | Status: DC | PRN

## 2012-10-26 MED ORDER — CIPROFLOXACIN IN D5W 400 MG/200ML IV SOLN
400.0000 mg | Freq: Once | INTRAVENOUS | Status: AC
  Administered 2012-10-26: 400 mg via INTRAVENOUS
  Filled 2012-10-26: qty 200

## 2012-10-26 MED ORDER — ASPIRIN EC 81 MG PO TBEC
81.0000 mg | DELAYED_RELEASE_TABLET | Freq: Every day | ORAL | Status: DC
  Filled 2012-10-26: qty 1

## 2012-10-26 MED ORDER — CIPROFLOXACIN IN D5W 400 MG/200ML IV SOLN
400.0000 mg | Freq: Two times a day (BID) | INTRAVENOUS | Status: DC
  Administered 2012-10-27: 400 mg via INTRAVENOUS
  Filled 2012-10-26 (×2): qty 200

## 2012-10-26 MED ORDER — LEVOTHYROXINE SODIUM 150 MCG PO TABS: 150.0000 ug | ORAL_TABLET | Freq: Every day | ORAL | Status: DC

## 2012-10-26 MED ORDER — SODIUM CHLORIDE 0.9 % IV BOLUS (SEPSIS)
1000.0000 mL | Freq: Once | INTRAVENOUS | Status: AC
  Administered 2012-10-26: 1000 mL via INTRAVENOUS

## 2012-10-26 MED ORDER — SIMVASTATIN 40 MG PO TABS
40.0000 mg | ORAL_TABLET | Freq: Every day | ORAL | Status: DC
  Filled 2012-10-26 (×2): qty 1

## 2012-10-26 MED ORDER — IOHEXOL 300 MG/ML  SOLN
100.0000 mL | Freq: Once | INTRAMUSCULAR | Status: AC | PRN
  Administered 2012-10-26: 100 mL via INTRAVENOUS

## 2012-10-26 MED ORDER — METRONIDAZOLE IN NACL 5-0.79 MG/ML-% IV SOLN
500.0000 mg | Freq: Three times a day (TID) | INTRAVENOUS | Status: DC
  Administered 2012-10-27 – 2012-11-03 (×23): 500 mg via INTRAVENOUS
  Filled 2012-10-26 (×28): qty 100

## 2012-10-26 MED ORDER — POTASSIUM CHLORIDE 10 MEQ/100ML IV SOLN
10.0000 meq | INTRAVENOUS | Status: DC
  Administered 2012-10-26: 10 meq via INTRAVENOUS
  Filled 2012-10-26: qty 100

## 2012-10-26 MED ORDER — LEVOTHYROXINE SODIUM 25 MCG PO TABS
225.0000 ug | ORAL_TABLET | ORAL | Status: DC
  Administered 2012-10-29: 225 ug via ORAL
  Filled 2012-10-26: qty 1

## 2012-10-26 MED ORDER — LEVOTHYROXINE SODIUM 150 MCG PO TABS
150.0000 ug | ORAL_TABLET | ORAL | Status: DC
  Administered 2012-10-28 – 2012-11-03 (×6): 150 ug via ORAL
  Filled 2012-10-26 (×10): qty 1

## 2012-10-26 MED ORDER — PANTOPRAZOLE SODIUM 40 MG PO TBEC: 40.0000 mg | DELAYED_RELEASE_TABLET | Freq: Every day | ORAL | Status: DC

## 2012-10-26 MED ORDER — OMEPRAZOLE MAGNESIUM 20 MG PO TBEC: 20.0000 mg | DELAYED_RELEASE_TABLET | ORAL | Status: DC

## 2012-10-26 NOTE — H&P (Signed)
[facility] History and Physical  Jackie Stevens RUE:[id number] DOB: Jul 17, 1946    PCP:   Danise Edge, MD   Chief Complaint: left lower abdominal pain.  HPI: Jackie Stevens is an 66 y.o. female with hx of known diverticulosis, last colonoscopy was over 5 years ago, Hx of sleep apnea, hypothyroidism, HTN, thyroid cancer,  Hyperlipidemia, sent to the ER by her PCP Dr Abner Greenspan after being tx emperically with Cipro/Flagyl for diverticulitis.  Her outpatient abdominal pelvic CT confirmed acute sigmoid diverticulitis with large complex abcess of 9x7x6.5 cm.  She was previously tx with Macrodantin for ?UTI.  She also has had some constipation as well.  Evaluation in the ER showed Na 123, on HCTZ, leukocytosis with WBC of 20K, and anemia, with Hb of 10.9 G/DL.  It should be noted that she had hysterectomy.  After the EDP consulted Dr Lindie Spruce of surgical service, hospitalist was asked to admit her for acute diverticulitis and diverticular abcess with hypovolemic hyponatremia.  Rewiew of Systems:  Constitutional: Negative for malaise, fever and chills. No significant weight loss or weight gain Eyes: Negative for eye pain, redness and discharge, diplopia, visual changes, or flashes of light. ENMT: Negative for ear pain, hoarseness, nasal congestion, sinus pressure and sore throat. No headaches; tinnitus, drooling, or problem swallowing. Cardiovascular: Negative for chest pain, palpitations, diaphoresis, dyspnea and peripheral edema. ; No orthopnea, PND Respiratory: Negative for cough, hemoptysis, wheezing and stridor. No pleuritic chestpain. Gastrointestinal: Negative for nausea, vomiting, diarrhea, melena, blood in stool, hematemesis, jaundice and rectal bleeding.    Genitourinary: Negative for frequency, dysuria, incontinence,flank pain and hematuria; Musculoskeletal: Negative for back pain and neck pain. Negative for swelling and trauma.;  Skin: . Negative for pruritus, rash, abrasions, bruising and  skin lesion.; ulcerations Neuro: Negative for headache, lightheadedness and neck stiffness. Negative for weakness, altered level of consciousness , altered mental status, extremity weakness, burning feet, involuntary movement, seizure and syncope.  Psych: negative for anxiety, depression, insomnia, tearfulness, panic attacks, hallucinations, paranoia, suicidal or homicidal ideation    Past Medical History  Diagnosis Date  . Allergic rhinitis   . HTN (hypertension)   . OSA (obstructive sleep apnea)   . GERD (gastroesophageal reflux disease)   . History of thyroid cancer   . Hyperlipidemia   . Chicken pox as a child  . Measles as a child  . Cancer 1980's    thyroid  . Insomnia   . Thyroid disease   . Constipation   . Diverticulitis 10/25/2012  . Abnormal cervical cytology 10/25/2012    Follows with Dr Maggie Font of Gyn    Past Surgical History  Procedure Date  . Thyroidectomy, partial 1988  . Tonsillectomy as a kid  . Total abdominal hysterectomy 70's  . Cholecystectomy 1990    Medications:  HOME MEDS: Prior to Admission medications   Medication Sig Start Date End Date Taking? Authorizing Provider  aspirin EC 81 MG tablet Take 81 mg by mouth daily.   Yes Historical Provider, MD  ciprofloxacin (CIPRO) 500 MG tablet Take 1 tablet (500 mg total) by mouth 2 (two) times daily. 10/25/12  Yes Bradd Canary, MD  estradiol (ESTRACE) 0.1 MG/GM vaginal cream Place 2 g vaginally 2 (two) times a week. On Sundays and Wednesdays   Yes Historical Provider, MD  etodolac (LODINE) 400 MG tablet Take 400 mg by mouth 2 (two) times daily.    Yes Historical Provider, MD  Boris Lown Oil 300 MG CAPS Take 300 mg by mouth daily.  Yes Historical Provider, MD  levothyroxine (SYNTHROID, LEVOTHROID) 150 MCG tablet Take 150-225 mcg by mouth daily. 1 tablet (150 mcg) every day except Sunday, 1 1/2 tablets (225 mcg) on Sunday   Yes Historical Provider, MD  metoCLOPramide (REGLAN) 10 MG tablet Take 10 mg by  mouth at bedtime.    Yes Historical Provider, MD  metroNIDAZOLE (FLAGYL) 500 MG tablet Take 1 tablet (500 mg total) by mouth 3 (three) times daily. 10/25/12  Yes Bradd Canary, MD  nitroGLYCERIN (NITROSTAT) 0.4 MG SL tablet Place 0.4 mg under the tongue every 5 (five) minutes as needed. For chest pain   Yes Historical Provider, MD  omeprazole (PRILOSEC OTC) 20 MG tablet Take 20 mg by mouth 2 (two) times daily as needed. For acid reflux   Yes Historical Provider, MD  promethazine (PHENERGAN) 25 MG tablet Take 1 tablet (25 mg total) by mouth every 8 (eight) hours as needed for nausea. 10/25/12  Yes Bradd Canary, MD  psyllium (METAMUCIL) 58.6 % powder Take 1 packet by mouth at bedtime.   Yes Historical Provider, MD  simvastatin (ZOCOR) 40 MG tablet Take 40 mg by mouth at bedtime.    Yes Historical Provider, MD  traMADol (ULTRAM) 50 MG tablet Take 50 mg by mouth at bedtime.   Yes Historical Provider, MD  valsartan-hydrochlorothiazide (DIOVAN-HCT) 320-25 MG per tablet Take 1 tablet by mouth daily. 03/09/12 03/09/13 Yes Cassell Clement, MD  vitamin B-12 (CYANOCOBALAMIN) 1000 MCG tablet Take 1,000 mcg by mouth daily.   Yes Historical Provider, MD     Allergies:  Allergies  Allergen Reactions  . Niacin Cough  . Neomycin-Bacitracin Zn-Polymyx Rash    Social History:   reports that she has never smoked. She has never used smokeless tobacco. She reports that she drinks alcohol. She reports that she does not use illicit drugs.  Family History: Family History  Problem Relation Age of Onset  . Heart disease Father   . Pneumonia Father   . Hypertension Father   . Hyperlipidemia Father   . Cancer Father     skin  . Stroke Father   . Cancer Paternal Aunt   . Alzheimer's disease Mother   . Heart disease Mother   . Emphysema Brother     marijuana and cigarettes  . Alcohol abuse Brother   . Hearing loss Brother   . Diabetes Maternal Grandmother   . Alzheimer's disease Paternal Grandmother   .  Cancer Paternal Grandmother     lung?- smoker  . Hyperlipidemia Paternal Grandmother   . Heart attack Paternal Grandfather   . Alcohol abuse Paternal Grandfather      Physical Exam: Filed Vitals:   10/26/12 1842  BP: 133/53  Pulse: 108  Temp: 99.1 F (37.3 C)  TempSrc: Oral  Resp: 18  SpO2: 96%   Blood pressure 133/53, pulse 108, temperature 99.1 F (37.3 C), temperature source Oral, resp. rate 18, SpO2 96.00%.  GEN:  Pleasant  patient lying in the stretcher in no acute distress; cooperative with exam. PSYCH:  alert and oriented x4; does not appear anxious or depressed; affect is appropriate. HEENT: Mucous membranes pink and anicteric; PERRLA; EOM intact; no cervical lymphadenopathy nor thyromegaly or carotid bruit; no JVD; There were no stridor. Neck is very supple. Breasts:: Not examined CHEST WALL: No tenderness CHEST: Normal respiration, clear to auscultation bilaterally.  HEART: Regular rate and rhythm.  There are no murmur, rub, or gallops.   BACK: No kyphosis or scoliosis; no CVA tenderness ABDOMEN:  soft and non-tender; no masses, no organomegaly, normal abdominal bowel sounds; no pannus; no intertriginous candida. There is no rebound and no distention. Rectal Exam: Not done EXTREMITIES: No bone or joint deformity; age-appropriate arthropathy of the hands and knees; no edema; no ulcerations.  There is no calf tenderness. Genitalia: not examined PULSES: 2+ and symmetric SKIN: Normal hydration no rash or ulceration CNS: Cranial nerves 2-12 grossly intact no focal lateralizing neurologic deficit.  Speech is fluent; uvula elevated with phonation, facial symmetry and tongue midline. DTR are normal bilaterally, cerebella exam is intact, barbinski is negative and strengths are equaled bilaterally.  No sensory loss.   Labs on Admission:  Basic Metabolic Panel:  Lab 10/26/12 2130 10/25/12 1157  NA 123* 127*  K 3.6 4.2  CL 87* 89*  CO2 24 28  GLUCOSE 106* 104*  BUN 13 19    CREATININE 0.89 1.1  CALCIUM 8.2* 8.5  MG -- --  PHOS -- 3.8   Liver Function Tests:  Lab 10/25/12 1157  AST 28  ALT 54*  ALKPHOS 136*  BILITOT 0.3  PROT 7.3  ALBUMIN 3.1*3.1*   No results found for this basename: LIPASE:5,AMYLASE:5 in the last 168 hours No results found for this basename: AMMONIA:5 in the last 168 hours CBC:  Lab 10/26/12 1944 10/25/12 1157  WBC 20.1* 18.5 Repeated and verified X2.*  NEUTROABS 16.9* --  HGB 10.9* 11.7*  HCT 31.1* 36.6  MCV 85.0 90.8  PLT 372 416.0*   Cardiac Enzymes: No results found for this basename: CKTOTAL:5,CKMB:5,CKMBINDEX:5,TROPONINI:5 in the last 168 hours  CBG: No results found for this basename: GLUCAP:5 in the last 168 hours   Radiological Exams on Admission: Ct Abdomen Pelvis W Contrast  10/26/2012  *RADIOLOGY REPORT*  Clinical Data: Abdominal pain.  Elevated white blood count.  Weight loss, nausea and vomiting, and anorexia.  CT ABDOMEN AND PELVIS WITH CONTRAST  Technique:  Multidetector CT imaging of the abdomen and pelvis was performed following the standard protocol during bolus administration of intravenous contrast.  Contrast: OMNIPAQUE IOHEXOL 300 MG/ML  SOLN  Comparison: Radiographs dated 10/25/2012  Findings: The patient has acute sigmoid diverticulitis with perforation with a multi septated air containing pericolonic abscess low in the pelvis measuring 9.0 x 7.2 x 6.5 cm.  There are numerous diverticula in the distal colon.  2.8 cm cyst on the otherwise normal right ovary.  Left ovary is not appreciable. Uterus has been removed.  IMPRESSION: Acute sigmoid diverticulitis with a large complex pelvic abscess.  The patient does have bladder prolapse.   Original Report Authenticated By: Francene Boyers, M.D.    Dg Abd 2 Views  10/25/2012  *RADIOLOGY REPORT*  Clinical Data: Pain, constipation  ABDOMEN - 2 VIEW  Comparison: None.  Findings: There is nonspecific nonobstructive bowel gas pattern. Post cholecystectomy  surgical clips are noted. No free abdominal air.  Mild degenerative changes lumbar spine.  IMPRESSION: Nonspecific nonobstructive bowel gas pattern.   Original Report Authenticated By: Natasha Mead, M.D.      Assessment/Plan Present on Admission:  . Thyroid disease . Diverticulitis . HYPERTENSION . G E R D . Constipation . OBSTRUCTIVE SLEEP APNEA   PLAN:  This nice patient is admitted for diverticular abcess that is rather large (9cmx7x6.5cm).  Will make her NPO.  Start IV Cipro/Flagyl.  I suspect she will need surgical intervention, but will defer decision to Surgery.  Since diverticulitis often mimics colon carcinoma, afterward, she should get a repeated colonoscopy as well.  Her low  Na is definitely hypovolemic diuretic induced hyponatremia.  Will give NS and stop her diuretic. She is stable at this time, full code, and will be admitted to general medical floor under [facility] service.  Will await further recommendation from surgery.  Thank you for allowing me to partake in the care of your nice patient.  Other plans as per orders.  Code Status: FULL Unk Lightning, MD. [facility] Pager [phone] 7pm to 7am.  10/26/2012, 10:11 PM

## 2012-10-26 NOTE — ED Provider Notes (Signed)
History     CSN: [id number]  Arrival date & time 10/26/12  4098   First MD Initiated Contact with Patient 10/26/12 1844      Chief Complaint  Patient presents with  . Abdominal Pain    (Consider location/radiation/quality/duration/timing/severity/associated sxs/prior treatment) HPI Comments: The patient has had approximately 2 weeks of lower abdominal pain that apparently been worsening. She saw her primary doctor yesterday who scheduled an outpatient CT scan and started her on Cipro and Flagyl for diverticulitis. Her CT scan today revealed a complicated pelvic abscess related to her diverticulitis so she was referred to the ER for management. As stated her pain has worsened and has had several days with associated nausea and vomiting.  Patient is a 66 y.o. female presenting with abdominal pain. The history is provided by the patient and medical records. No language interpreter was used.  Abdominal Pain The primary symptoms of the illness include abdominal pain, nausea, vomiting and dysuria. The primary symptoms of the illness do not include fever, fatigue, shortness of breath, diarrhea, hematemesis or hematochezia. The current episode started more than 2 days ago. The onset of the illness was gradual. The problem has been gradually worsening.  The abdominal pain began more than 2 days ago. The pain came on gradually. The abdominal pain has been gradually worsening since its onset. The abdominal pain is located in the RLQ, LLQ and suprapubic region. The abdominal pain does not radiate. The severity of the abdominal pain is 7/10. The abdominal pain is relieved by nothing. The abdominal pain is exacerbated by movement.  The vomiting began 2 days ago. Vomiting occurs 2 to 5 times per day. The emesis contains stomach contents.  The patient states that she believes she is currently not pregnant. The patient has not had a change in bowel habit. Additional symptoms associated with the illness include  chills and anorexia. Symptoms associated with the illness do not include diaphoresis or back pain. Significant associated medical issues do not include diabetes.    Past Medical History  Diagnosis Date  . Allergic rhinitis   . HTN (hypertension)   . OSA (obstructive sleep apnea)   . GERD (gastroesophageal reflux disease)   . History of thyroid cancer   . Hyperlipidemia   . Chicken pox as a child  . Measles as a child  . Cancer 1980's    thyroid  . Insomnia   . Thyroid disease   . Constipation   . Diverticulitis 10/25/2012  . Abnormal cervical cytology 10/25/2012    Follows with Dr Maggie Font of Gyn    Past Surgical History  Procedure Date  . Thyroidectomy, partial 1988  . Tonsillectomy as a kid  . Total abdominal hysterectomy 70's  . Cholecystectomy 1990    Family History  Problem Relation Age of Onset  . Heart disease Father   . Pneumonia Father   . Hypertension Father   . Hyperlipidemia Father   . Cancer Father     skin  . Stroke Father   . Cancer Paternal Aunt   . Alzheimer's disease Mother   . Heart disease Mother   . Emphysema Brother     marijuana and cigarettes  . Alcohol abuse Brother   . Hearing loss Brother   . Diabetes Maternal Grandmother   . Alzheimer's disease Paternal Grandmother   . Cancer Paternal Grandmother     lung?- smoker  . Hyperlipidemia Paternal Grandmother   . Heart attack Paternal Grandfather   . Alcohol abuse Paternal  Grandfather     History  Substance Use Topics  . Smoking status: Never Smoker   . Smokeless tobacco: Never Used  . Alcohol Use: Yes     Comment: occasional    OB History    Grav Para Term Preterm Abortions TAB SAB Ect Mult Living                  Review of Systems  Constitutional: Positive for chills. Negative for fever, diaphoresis, activity change, appetite change and fatigue.  HENT: Negative for congestion, rhinorrhea, neck pain, neck stiffness and sinus pressure.   Eyes: Negative for discharge and  visual disturbance.  Respiratory: Negative for cough, chest tightness, shortness of breath, wheezing and stridor.   Cardiovascular: Negative for chest pain and leg swelling.  Gastrointestinal: Positive for nausea, vomiting, abdominal pain and anorexia. Negative for diarrhea, hematochezia, abdominal distention and hematemesis.  Genitourinary: Positive for dysuria. Negative for decreased urine volume and difficulty urinating.  Musculoskeletal: Negative for back pain and arthralgias.  Skin: Negative for color change and pallor.  Neurological: Negative for weakness, light-headedness and headaches.  Psychiatric/Behavioral: Negative for behavioral problems and agitation.  All other systems reviewed and are negative.    Allergies  Neomycin-bacitracin zn-polymyx and Niacin  Home Medications   Current Outpatient Rx  Name  Route  Sig  Dispense  Refill  . ASPIRIN 81 MG PO TABS   Oral   Take 81 mg by mouth daily.           Marland Kitchen CIPROFLOXACIN HCL 500 MG PO TABS   Oral   Take 1 tablet (500 mg total) by mouth 2 (two) times daily.   20 tablet   0   . ESTRADIOL 0.1 MG/GM VA CREA   Vaginal   Place 2 g vaginally daily.         . ETODOLAC 400 MG PO TABS   Oral   Take 400 mg by mouth 2 (two) times daily.           Marland Kitchen LEVOTHYROXINE SODIUM 150 MCG PO TABS      1 1/2  tabs once a week         . METOCLOPRAMIDE HCL 10 MG PO TABS   Oral   Take 10 mg by mouth as needed.          Marland Kitchen METRONIDAZOLE 500 MG PO TABS   Oral   Take 1 tablet (500 mg total) by mouth 3 (three) times daily.   30 tablet   0   . NITROSTAT 0.4 MG SL SUBL   Sublingual   Place 0.4 mg under the tongue every 5 (five) minutes as needed.          Marland Kitchen FISH OIL 1200 MG PO CAPS   Oral   Take 1 capsule by mouth daily.           Marland Kitchen OMEPRAZOLE 20 MG PO CPDR   Oral   Take 20 mg by mouth 2 (two) times daily.          Marland Kitchen PROMETHAZINE HCL 25 MG PO TABS   Oral   Take 1 tablet (25 mg total) by mouth every 8 (eight) hours  as needed for nausea.   20 tablet   0   . METAMUCIL PLUS CALCIUM PO   Oral   Take 1 tablet by mouth daily.          Marland Kitchen SIMVASTATIN 40 MG PO TABS   Oral   Take 40 mg by  mouth at bedtime.           . TRAMADOL HCL ER (BIPHASIC) 200 MG PO TB24   Oral   Take 1 tablet by mouth every 8 (eight) hours as needed (cough).   30 tablet   3   . VALSARTAN-HYDROCHLOROTHIAZIDE 320-25 MG PO TABS   Oral   Take 1 tablet by mouth daily.   90 tablet   3   . VITAMIN B-12 1000 MCG PO TABS   Oral   Take 1,000 mcg by mouth daily.           Marland Kitchen ZALEPLON 10 MG PO CAPS      As needed for sleep if you plan to sleep at least 3 more hours   30 capsule   5     BP 133/53  Pulse 108  Temp 99.1 F (37.3 C) (Oral)  Resp 18  SpO2 96%  Physical Exam  Nursing note and vitals reviewed. Constitutional: She is oriented to person, place, and time. She appears well-developed and well-nourished. No distress.  HENT:  Head: Normocephalic and atraumatic.  Mouth/Throat: No oropharyngeal exudate.  Eyes: EOM are normal. Pupils are equal, round, and reactive to light. Right eye exhibits no discharge. Left eye exhibits no discharge.  Neck: Normal range of motion. Neck supple. No JVD present.  Cardiovascular: Normal rate, regular rhythm and normal heart sounds.   Pulmonary/Chest: Effort normal and breath sounds normal. No stridor. No respiratory distress. She exhibits no tenderness.  Abdominal: Soft. Bowel sounds are normal. She exhibits no distension. There is tenderness (lower abd). There is guarding (lower abd all across).  Musculoskeletal: Normal range of motion. She exhibits no edema and no tenderness.  Neurological: She is alert and oriented to person, place, and time. No cranial nerve deficit. She exhibits normal muscle tone.  Skin: Skin is warm and dry. No rash noted. She is not diaphoretic.  Psychiatric: She has a normal mood and affect. Her behavior is normal. Judgment and thought content normal.     ED Course  Procedures (including critical care time)  Labs Reviewed  CBC WITH DIFFERENTIAL - Abnormal; Notable for the following:    WBC 20.1 (*)     RBC 3.66 (*)     Hemoglobin 10.9 (*)     HCT 31.1 (*)     Neutrophils Relative 84 (*)     Neutro Abs 16.9 (*)     Lymphocytes Relative 7 (*)     Monocytes Absolute 1.9 (*)     All other components within normal limits  BASIC METABOLIC PANEL - Abnormal; Notable for the following:    Sodium 123 (*)     Chloride 87 (*)     Glucose, Bld 106 (*)     Calcium 8.2 (*)     GFR calc non Af Amer 65 (*)     GFR calc Af Amer 75 (*)     All other components within normal limits  URINALYSIS, ROUTINE W REFLEX MICROSCOPIC - Abnormal; Notable for the following:    Specific Gravity, Urine 1.036 (*)     Bilirubin Urine SMALL (*)     Ketones, ur 15 (*)     All other components within normal limits   Ct Abdomen Pelvis W Contrast  10/26/2012  *RADIOLOGY REPORT*  Clinical Data: Abdominal pain.  Elevated white blood count.  Weight loss, nausea and vomiting, and anorexia.  CT ABDOMEN AND PELVIS WITH CONTRAST  Technique:  Multidetector CT imaging of the abdomen and  pelvis was performed following the standard protocol during bolus administration of intravenous contrast.  Contrast: OMNIPAQUE IOHEXOL 300 MG/ML  SOLN  Comparison: Radiographs dated 10/25/2012  Findings: The patient has acute sigmoid diverticulitis with perforation with a multi septated air containing pericolonic abscess low in the pelvis measuring 9.0 x 7.2 x 6.5 cm.  There are numerous diverticula in the distal colon.  2.8 cm cyst on the otherwise normal right ovary.  Left ovary is not appreciable. Uterus has been removed.  IMPRESSION: Acute sigmoid diverticulitis with a large complex pelvic abscess.  The patient does have bladder prolapse.   Original Report Authenticated By: Francene Boyers, M.D.    Dg Abd 2 Views  10/25/2012  *RADIOLOGY REPORT*  Clinical Data: Pain, constipation  ABDOMEN  - 2 VIEW  Comparison: None.  Findings: There is nonspecific nonobstructive bowel gas pattern. Post cholecystectomy surgical clips are noted. No free abdominal air.  Mild degenerative changes lumbar spine.  IMPRESSION: Nonspecific nonobstructive bowel gas pattern.   Original Report Authenticated By: Natasha Mead, M.D.      1. Colonic diverticular abscess   2. Hyponatremia   3. Constipation   4. Diverticulitis       MDM  7:13 PM sent by PCP w/ CT confirmed diverticulitis w/ pelvic abscess. Stable now, no peritonitis. Offered pain and nausea medicine for symptoms the patient declined. plan to admit to hospitalist for IV antibiotics in likely percutaneous draining of abscess and surgery consult as an inpatient.  Admitted to medicine in stable condition. hypoNa noted. Placed on d5ns gtt      Warrick Parisian, MD 10/26/12 2311

## 2012-10-26 NOTE — Consult Note (Signed)
Reason for Consult:Perforated diverticular abscess Referring Physician: EDP/Hen[place]  Jackie Stevens is an 66 y.o. female.  HPI: the patient has had several weeks of abdominal pain and discomfort which she attributed to IBS.  Her gyn treated her for a UTI, but she worsened while on the antibiotics.  Her PMD changed her antibiotic, but the patient continued to worsen.  Abdominal films from yesterday failed to demonstrate any abnomality, however CT scan today demonstrated a large pelvic abscess associated with diverticulitis.  Surgery was consulted.  Past Medical History  Diagnosis Date  . Allergic rhinitis   . HTN (hypertension)   . OSA (obstructive sleep apnea)   . GERD (gastroesophageal reflux disease)   . History of thyroid cancer   . Hyperlipidemia   . Chicken pox as a child  . Measles as a child  . Cancer 1980's    thyroid  . Insomnia   . Thyroid disease   . Constipation   . Diverticulitis 10/25/2012  . Abnormal cervical cytology 10/25/2012    Follows with Dr Maggie Font of Gyn    Past Surgical History  Procedure Date  . Thyroidectomy, partial 1988  . Tonsillectomy as a kid  . Total abdominal hysterectomy 70's  . Cholecystectomy 1990    Family History  Problem Relation Age of Onset  . Heart disease Father   . Pneumonia Father   . Hypertension Father   . Hyperlipidemia Father   . Cancer Father     skin  . Stroke Father   . Cancer Paternal Aunt   . Alzheimer's disease Mother   . Heart disease Mother   . Emphysema Brother     marijuana and cigarettes  . Alcohol abuse Brother   . Hearing loss Brother   . Diabetes Maternal Grandmother   . Alzheimer's disease Paternal Grandmother   . Cancer Paternal Grandmother     lung?- smoker  . Hyperlipidemia Paternal Grandmother   . Heart attack Paternal Grandfather   . Alcohol abuse Paternal Grandfather     Social History:  reports that she has never smoked. She has never used smokeless tobacco. She reports that she  drinks alcohol. She reports that she does not use illicit drugs.  Allergies:  Allergies  Allergen Reactions  . Niacin Cough  . Neomycin-Bacitracin Zn-Polymyx Rash    Medications: I have reviewed the patient's current medications. Prior to Admission:  (Not in a hospital admission)  Results for orders placed during the hospital encounter of 10/26/12 (from the past 48 hour(s))  URINALYSIS, ROUTINE W REFLEX MICROSCOPIC     Status: Abnormal   Collection Time   10/26/12  7:43 PM      Component Value Range Comment   Color, Urine YELLOW  YELLOW    APPearance CLEAR  CLEAR    Specific Gravity, Urine 1.036 (*) 1.005 - 1.030    pH 6.0  5.0 - 8.0    Glucose, UA NEGATIVE  NEGATIVE mg/dL    Hgb urine dipstick NEGATIVE  NEGATIVE    Bilirubin Urine SMALL (*) NEGATIVE    Ketones, ur 15 (*) NEGATIVE mg/dL    Protein, ur NEGATIVE  NEGATIVE mg/dL    Urobilinogen, UA 0.2  0.0 - 1.0 mg/dL    Nitrite NEGATIVE  NEGATIVE    Leukocytes, UA NEGATIVE  NEGATIVE MICROSCOPIC NOT DONE ON URINES WITH NEGATIVE PROTEIN, BLOOD, LEUKOCYTES, NITRITE, OR GLUCOSE <1000 mg/dL.  CBC WITH DIFFERENTIAL     Status: Abnormal   Collection Time   10/26/12  7:44  PM      Component Value Range Comment   WBC 20.1 (*) 4.0 - 10.5 K/uL    RBC 3.66 (*) 3.87 - 5.11 MIL/uL    Hemoglobin 10.9 (*) 12.0 - 15.0 g/dL    HCT 16.1 (*) 09.6 - 46.0 %    MCV 85.0  78.0 - 100.0 fL    MCH 29.8  26.0 - 34.0 pg    MCHC 35.0  30.0 - 36.0 g/dL    RDW 04.5  40.9 - 81.1 %    Platelets 372  150 - 400 K/uL    Neutrophils Relative 84 (*) 43 - 77 %    Neutro Abs 16.9 (*) 1.7 - 7.7 K/uL    Lymphocytes Relative 7 (*) 12 - 46 %    Lymphs Abs 1.4  0.7 - 4.0 K/uL    Monocytes Relative 9  3 - 12 %    Monocytes Absolute 1.9 (*) 0.1 - 1.0 K/uL    Eosinophils Relative 0  0 - 5 %    Eosinophils Absolute 0.0  0.0 - 0.7 K/uL    Basophils Relative 0  0 - 1 %    Basophils Absolute 0.0  0.0 - 0.1 K/uL   BASIC METABOLIC PANEL     Status: Abnormal    Collection Time   10/26/12  7:44 PM      Component Value Range Comment   Sodium 123 (*) 135 - 145 mEq/L    Potassium 3.6  3.5 - 5.1 mEq/L    Chloride 87 (*) 96 - 112 mEq/L    CO2 24  19 - 32 mEq/L    Glucose, Bld 106 (*) 70 - 99 mg/dL    BUN 13  6 - 23 mg/dL    Creatinine, Ser 9.14  0.50 - 1.10 mg/dL    Calcium 8.2 (*) 8.4 - 10.5 mg/dL    GFR calc non Af Amer 65 (*) >90 mL/min    GFR calc Af Amer 75 (*) >90 mL/min     Ct Abdomen Pelvis W Contrast  10/26/2012  *RADIOLOGY REPORT*  Clinical Data: Abdominal pain.  Elevated white blood count.  Weight loss, nausea and vomiting, and anorexia.  CT ABDOMEN AND PELVIS WITH CONTRAST  Technique:  Multidetector CT imaging of the abdomen and pelvis was performed following the standard protocol during bolus administration of intravenous contrast.  Contrast: OMNIPAQUE IOHEXOL 300 MG/ML  SOLN  Comparison: Radiographs dated 10/25/2012  Findings: The patient has acute sigmoid diverticulitis with perforation with a multi septated air containing pericolonic abscess low in the pelvis measuring 9.0 x 7.2 x 6.5 cm.  There are numerous diverticula in the distal colon.  2.8 cm cyst on the otherwise normal right ovary.  Left ovary is not appreciable. Uterus has been removed.  IMPRESSION: Acute sigmoid diverticulitis with a large complex pelvic abscess.  The patient does have bladder prolapse.   Original Report Authenticated By: Francene Boyers, M.D.    Dg Abd 2 Views  10/25/2012  *RADIOLOGY REPORT*  Clinical Data: Pain, constipation  ABDOMEN - 2 VIEW  Comparison: None.  Findings: There is nonspecific nonobstructive bowel gas pattern. Post cholecystectomy surgical clips are noted. No free abdominal air.  Mild degenerative changes lumbar spine.  IMPRESSION: Nonspecific nonobstructive bowel gas pattern.   Original Report Authenticated By: Natasha Mead, M.D.     Review of Systems  Constitutional: Negative for fever and chills.  HENT: Negative.   Cardiovascular:  Negative.   Gastrointestinal: Positive for nausea, vomiting  and abdominal pain.  Genitourinary: Negative.   Skin: Negative.   Neurological: Negative.   Endo/Heme/Allergies: Negative.   Psychiatric/Behavioral: Negative.    Blood pressure 128/47, pulse 88, temperature 99.6 F (37.6 C), temperature source Oral, resp. rate 14, SpO2 99.00%. Physical Exam  Constitutional: She is oriented to person, place, and time. She appears well-developed and well-nourished.  Eyes: Conjunctivae normal and EOM are normal. Pupils are equal, round, and reactive to light.  Neck: Normal range of motion. Neck supple.  Cardiovascular: Normal rate, regular rhythm and normal heart sounds.   Respiratory: Effort normal and breath sounds normal.  GI: Soft. Bowel sounds are normal. She exhibits no shifting dullness, no distension, no pulsatile liver and no fluid wave. There is tenderness in the suprapubic area, left upper quadrant and left lower quadrant. There is no rebound.  Musculoskeletal: Normal range of motion.  Neurological: She is alert and oriented to person, place, and time. She has normal reflexes.  Skin: Skin is warm and dry.  Psychiatric: She has a normal mood and affect. Her behavior is normal. Judgment and thought content normal.    Assessment/Plan: Diverticulitis with pelvic abscess  Needs IV antibiotics and percutaneous drainage of pelvic abscess If improves with this treatment then she can be fed and discharged eventually. If she does not improve, she will need surgery with the possibility of a colostomy  Marcques Wrightsman O 10/26/2012, 11:00 PM

## 2012-10-26 NOTE — Progress Notes (Signed)
Pt informed by MD yesterday

## 2012-10-26 NOTE — ED Notes (Addendum)
Pt reports that she had CT scan done of abd, and has hx of diverticulitis, CT showed that she had a pocket that burst and has an abscess in abd; sent by Dr Rogelia Rohrer for [facility] to see; reports intermittent pain but no pain currently; has vomited bile; last ate yesterday d/t unable to eat d/t pain and emesis

## 2012-10-26 NOTE — ED Notes (Signed)
Pt ambulatory to bathroom unassisted.

## 2012-10-27 ENCOUNTER — Ambulatory Visit (HOSPITAL_BASED_OUTPATIENT_CLINIC_OR_DEPARTMENT_OTHER): Payer: Medicare Other

## 2012-10-27 ENCOUNTER — Inpatient Hospital Stay (HOSPITAL_COMMUNITY): Payer: Medicare Other

## 2012-10-27 HISTORY — PX: TRANSRECTAL DRAINAGE OF PELVIC ABSCESS: SUR1387

## 2012-10-27 LAB — PROTIME-INR
INR: 1.24 (ref 0.00–1.49)
Prothrombin Time: 15.4 seconds — ABNORMAL HIGH (ref 11.6–15.2)

## 2012-10-27 LAB — URINE CULTURE: Colony Count: 60000

## 2012-10-27 MED ORDER — PROMETHAZINE HCL 25 MG/ML IJ SOLN: 12.5000 mg | Freq: Four times a day (QID) | INTRAMUSCULAR | Status: DC | PRN

## 2012-10-27 MED ORDER — FENTANYL CITRATE 0.05 MG/ML IJ SOLN
INTRAMUSCULAR | Status: AC
  Filled 2012-10-27: qty 4

## 2012-10-27 MED ORDER — HYDROMORPHONE HCL PF 1 MG/ML IJ SOLN
1.0000 mg | INTRAMUSCULAR | Status: DC
  Administered 2012-10-27 – 2012-10-29 (×13): 1 mg via INTRAVENOUS
  Filled 2012-10-27 (×13): qty 1

## 2012-10-27 MED ORDER — MIDAZOLAM HCL 2 MG/2ML IJ SOLN
INTRAMUSCULAR | Status: AC
  Filled 2012-10-27: qty 4

## 2012-10-27 MED ORDER — CHLORHEXIDINE GLUCONATE 0.12 % MT SOLN
OROMUCOSAL | Status: AC
  Administered 2012-10-27: 15 mL
  Filled 2012-10-27: qty 15

## 2012-10-27 MED ORDER — MIDAZOLAM HCL 2 MG/2ML IJ SOLN
INTRAMUSCULAR | Status: AC | PRN
  Administered 2012-10-27: 0.5 mg via INTRAVENOUS
  Administered 2012-10-27: 1 mg via INTRAVENOUS
  Administered 2012-10-27: 0.5 mg via INTRAVENOUS

## 2012-10-27 MED ORDER — PROMETHAZINE HCL 25 MG/ML IJ SOLN
12.5000 mg | Freq: Four times a day (QID) | INTRAMUSCULAR | Status: DC | PRN
  Administered 2012-10-30 – 2012-10-31 (×4): 12.5 mg via INTRAVENOUS
  Filled 2012-10-27 (×4): qty 1

## 2012-10-27 MED ORDER — FENTANYL CITRATE 0.05 MG/ML IJ SOLN
INTRAMUSCULAR | Status: AC | PRN
  Administered 2012-10-27 (×2): 25 ug via INTRAVENOUS

## 2012-10-27 MED ORDER — WHITE PETROLATUM GEL
Status: AC
  Filled 2012-10-27: qty 5

## 2012-10-27 NOTE — Progress Notes (Signed)
Patient evaluated for long-term disease management services with [facility] as benefit of her [facility] insurance. Spoke with patient at bedside to explain that she receive a post discharge transition of care call and will be evaluated for monthly home visits for assessments and for education if needed. Explained these services do not interfere or replace any services that arranged by inpatient case management. Left contact information and [facility] Care Management information sheet at bedside with patient. Appreciative of visit.     Raiford Noble, RN,BSN, MSN [facility] Liaison [phone]

## 2012-10-27 NOTE — Progress Notes (Signed)
PROGRESS NOTE  Jackie Stevens:[id number] DOB: 01-Jul-1946 DOA: 10/26/2012 PCP: Danise Edge, MD  Brief narrative: 66 yr old CF admitted from PCP office with a h/o intractable n/v/ diarrhea.  Of note, she went to her Ob-Gyn on 10/23 or 10/24 and received a 2 week dose of Macrobid for a presumed UTI.    Past medical history-As per Problem list Chart reviewed as below-   Admission 07/18/11 with non-cardiogenic CP  See Dr. Levonne Spiller young for OSA-Bedtime 1030-1200 MN, latency 10 minutes, up 530- 630 AM. Diagnostic NPSGat [facility] 07/15/10- AHI 13.5/hr. CPAP titration at [facility] facility 08/25/10- CPAP 10> AHI 0/hr.  H/o thyroid cancer c Partial thyroidectomy RAI rx  H/o asthma  H/o abnormal cervical cytology  Consultants:  Interventional radiology  Gen surgery  Procedures:  2 view abdominal xray=non-obstructive bowel gas pattern  Acute sigmoid diverticulitis with a large complex pelvic abscess. The patient does have bladder prolapse.   Antibiotics:  Cipro 11/14-11/15  Flagyl 11/14 >>   Subjective  States she has abdominal pain. States the pain is 2/10. Patient not able to rest. Patient has had diarrhea every hour on the hour since admission n Had one episode of dry heaving and no vomit. Is not hungry No cough no cold no fever.   Objective    Interim History: Reviewed chart  Telemetry: None telemetry  Objective: Filed Vitals:   10/26/12 1842 10/26/12 2235 10/26/12 2313 10/27/12 0510  BP: 133/53 128/47 124/53 139/53  Pulse: 108 88 99 97  Temp: 99.1 F (37.3 C) 99.6 F (37.6 C) 98.9 F (37.2 C) 99.6 F (37.6 C)  TempSrc: Oral Oral    Resp: 18 14 18 18   Height:   5\' 2"  (1.575 m)   Weight:   92.534 kg (204 lb)   SpO2: 96% 99% 95% 98%    Intake/Output Summary (Last 24 hours) at 10/27/12 1327 Last data filed at 10/27/12 0600  Gross per 24 hour  Intake    799 ml  Output      0 ml  Net    799 ml    Exam:  General: Pleasant alert oriented Caucasian female no  apparent distress Cardiovascular: S1-S2? 3/6 early systolic murmur left lower sternal edge, mild cardiomegaly by exam Respiratory: Clinically clear no added sound, no tactile vocal resonance or fremitus Abdomen: Slightly tender in lower quadrant no rebound guarding. Bowel sounds decreased Skin no lower extremity edema Neuro motor grossly intact  Data Reviewed: Basic Metabolic Panel:  Lab 10/26/12 8119 10/25/12 1157  NA 123* 127*  K 3.6 4.2  CL 87* 89*  CO2 24 28  GLUCOSE 106* 104*  BUN 13 19  CREATININE 0.89 1.1  CALCIUM 8.2* 8.5  MG -- --  PHOS -- 3.8   Liver Function Tests:  Lab 10/25/12 1157  AST 28  ALT 54*  ALKPHOS 136*  BILITOT 0.3  PROT 7.3  ALBUMIN 3.1*3.1*   No results found for this basename: LIPASE:5,AMYLASE:5 in the last 168 hours No results found for this basename: AMMONIA:5 in the last 168 hours CBC:  Lab 10/26/12 1944 10/25/12 1157  WBC 20.1* 18.5 Repeated and verified X2.*  NEUTROABS 16.9* --  HGB 10.9* 11.7*  HCT 31.1* 36.6  MCV 85.0 90.8  PLT 372 416.0*   Cardiac Enzymes: No results found for this basename: CKTOTAL:5,CKMB:5,CKMBINDEX:5,TROPONINI:5 in the last 168 hours BNP: No components found with this basename: POCBNP:5 CBG: No results found for this basename: GLUCAP:5 in the last 168 hours  Recent Results (from  the past 240 hour(s))  URINE CULTURE     Status: Normal   Collection Time   10/25/12  1:39 PM      Component Value Range Status Comment   Colony Count 60,000 COLONIES/ML   Final    Organism ID, Bacteria Multiple bacterial morphotypes present, none   Final    Organism ID, Bacteria predominant. Suggest appropriate recollection if    Final    Organism ID, Bacteria clinically indicated.   Final      Studies:              All Imaging reviewed and is as per above notation   Scheduled Meds:   . [COMPLETED] ciprofloxacin  400 mg Intravenous Once  . ciprofloxacin  400 mg Intravenous Q12H  . heparin  5,000 Units Subcutaneous Q8H    . levothyroxine  150 mcg Oral Custom  . levothyroxine  225 mcg Oral Custom  . [COMPLETED] metronidazole  500 mg Intravenous Once  . metronidazole  500 mg Intravenous Q8H  . pantoprazole  40 mg Oral Daily  . simvastatin  40 mg Oral QHS  . [COMPLETED] sodium chloride  1,000 mL Intravenous Once  . white petrolatum      . [DISCONTINUED] aspirin EC  81 mg Oral Daily  . [DISCONTINUED] levothyroxine  150-225 mcg Oral Daily  . [DISCONTINUED] omeprazole  20 mg Oral BH-q7a  . [DISCONTINUED] potassium chloride  10 mEq Intravenous Q1 Hr x 2   Continuous Infusions:   . dextrose 5 % and 0.9% NaCl 100 mL/hr at 10/26/12 2154     Assessment/Plan: 1. ? Diverticulitis differential diagnoses = C. difficile colitis-patient was on Macrobid for 2 weeks and has been on only resolve twice a day. Feel it is prudent to get a C. difficile panel. Will discontinue her ciprofloxacin for now and keep her on just Flagyl. A C. difficile turns out to be positive she will need 14 days of therapy and need to be off of her proton pump inhibitor. Made her IV Dilaudid scheduled every 2 hourly-patient can decline if she needs this 2. ? Diverticular abscess-I have seen the patient and will be training this 11/16-patient n.p.o. for procedure 3. Nausea vomiting-added Phenergan 12.5 to her scheduled Zofran. 4. 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 History of obstructive sleep apnea-will place her on AutoPap-respiratory therapist to determine settings 6. History of abnormal cervical cytology-per Dr. Elijah Birk Estrace 0.1 mg  7. HLD-hold statin for now 8. Htn-given volume depletion and diarroea, hold Diovan-hct 320-25 for now 9. Hyponatremia-probably 2/2 to diauretic and ongoing GI losses.  Follow B[facility] in am  Code Status: Full Family Communication: discussed in detail POC in room with patient/Family Disposition Plan:  Inpatient   Pleas Koch, MD  [facility] Pager [phone] 10/27/2012, 1:27 PM    LOS: 1 day

## 2012-10-27 NOTE — Progress Notes (Signed)
INITIAL ADULT NUTRITION ASSESSMENT Date: 10/27/2012   Time: 10:10 AM Reason for Assessment: MST  INTERVENTION: Supplement diet when appropriate  DOCUMENTATION CODES Per approved criteria  -Obesity Unspecified    ASSESSMENT: Female 66 y.o.  Dx: LL abdominal pain  Hx:  Past Medical History  Diagnosis Date  . Allergic rhinitis   . HTN (hypertension)   . OSA (obstructive sleep apnea)   . GERD (gastroesophageal reflux disease)   . History of thyroid cancer   . Hyperlipidemia   . Chicken pox as a child  . Measles as a child  . Cancer 1980's    thyroid  . Insomnia   . Thyroid disease   . Constipation   . Diverticulitis 10/25/2012  . Abnormal cervical cytology 10/25/2012    Follows with Dr Maggie Font of Gyn   Past Surgical History  Procedure Date  . Thyroidectomy, partial 1988  . Tonsillectomy as a kid  . Total abdominal hysterectomy 70's  . Cholecystectomy 1990   Related Meds:  Scheduled Meds:   . [COMPLETED] ciprofloxacin  400 mg Intravenous Once  . ciprofloxacin  400 mg Intravenous Q12H  . heparin  5,000 Units Subcutaneous Q8H  . levothyroxine  150 mcg Oral Custom  . levothyroxine  225 mcg Oral Custom  . [COMPLETED] metronidazole  500 mg Intravenous Once  . metronidazole  500 mg Intravenous Q8H  . pantoprazole  40 mg Oral Daily  . simvastatin  40 mg Oral QHS  . [COMPLETED] sodium chloride  1,000 mL Intravenous Once  . white petrolatum      . [DISCONTINUED] aspirin EC  81 mg Oral Daily  . [DISCONTINUED] levothyroxine  150-225 mcg Oral Daily  . [DISCONTINUED] omeprazole  20 mg Oral BH-q7a  . [DISCONTINUED] potassium chloride  10 mEq Intravenous Q1 Hr x 2   Continuous Infusions:   . dextrose 5 % and 0.9% NaCl 100 mL/hr at 10/26/12 2154   PRN Meds:.HYDROmorphone (DILAUDID) injection, nitroGLYCERIN, ondansetron (ZOFRAN) IV, ondansetron, [DISCONTINUED]  morphine injection, [DISCONTINUED] ondansetron (ZOFRAN) IV, [DISCONTINUED] ondansetron (ZOFRAN) IV  Ht:  5\' 2"  (157.5 cm)  Wt: 204 lb (92.534 kg)  Ideal Wt: 50 kg % Ideal Wt: 185%  Usual Wt:  Wt Readings from Last 10 Encounters:  10/26/12 204 lb (92.534 kg)  10/25/12 204 lb 12.8 oz (92.897 kg)  08/25/12 214 lb 12.8 oz (97.433 kg)  07/25/12 219 lb 9.6 oz (99.61 kg)  04/21/12 220 lb (99.791 kg)  02/09/12 218 lb (98.884 kg)  09/23/11 214 lb (97.07 kg)  08/05/11 221 lb 12.8 oz (100.608 kg)  07/27/11 220 lb (99.791 kg)  07/26/11 220 lb 12.8 oz (100.154 kg)   % Usual Wt: 96%  Body mass index is 37.31 kg/(m^2). Obesity Class II  Food/Nutrition Related Hx: Decreased appetite x 1 month  Labs:  CMP     Component Value Date/Time   NA 123* 10/26/2012 1944   K 3.6 10/26/2012 1944   CL 87* 10/26/2012 1944   CO2 24 10/26/2012 1944   GLUCOSE 106* 10/26/2012 1944   BUN 13 10/26/2012 1944   CREATININE 0.89 10/26/2012 1944   CALCIUM 8.2* 10/26/2012 1944   PROT 7.3 10/25/2012 1157   ALBUMIN 3.1* 10/25/2012 1157   ALBUMIN 3.1* 10/25/2012 1157   AST 28 10/25/2012 1157   ALT 54* 10/25/2012 1157   ALKPHOS 136* 10/25/2012 1157   BILITOT 0.3 10/25/2012 1157   GFRNONAA 65* 10/26/2012 1944   GFRAA 75* 10/26/2012 1944  CBG (last 3)  No results found for  this basename: GLUCAP:3 in the last 72 hours   Intake/Output Summary (Last 24 hours) at 10/27/12 1013 Last data filed at 10/27/12 0600  Gross per 24 hour  Intake    799 ml  Output      0 ml  Net    799 ml   Diet Order: NPO  Supplements/Tube Feeding: none  IVF:    dextrose 5 % and 0.9% NaCl Last Rate: 100 mL/hr at 10/26/12 2154   Pt admitted for acute sigmoid diverticulitis with large complex abscess. Per sugery will be treated with bowel rest, abx and drain per IR, to be placed today.  Pt reports a 4% weight loss over the last month due to a decreased appetite. Prior weight loss was due to increased activity and healthier eating habits.    Estimated Nutritional Needs:   Kcal:  1700-1900 Protein:  90-100 grams Fluid:  >1.7  L/day  NUTRITION DIAGNOSIS: Inadequate oral intake r/t inability to eat AEB NPO status.  MONITORING/EVALUATION(Goals): Goal: Pt to meet >/= 90% of their estimated nutrition needs. Monitor: diet advancement, weight, labs  EDUCATION NEEDS: -No education needs identified at this time  Kendell Bane RD, LDN, CNSC [phone] Pager [phone] After Hours Pager  10/27/2012, 10:10 AM

## 2012-10-27 NOTE — Progress Notes (Signed)
Subjective: Having diarrhea, lower abdominal pain  Objective: Vital signs in last 24 hours: Temp:  [98.9 F (37.2 C)-99.6 F (37.6 C)] 99.6 F (37.6 C) (11/15 0510) Pulse Rate:  [88-108] 97  (11/15 0510) Resp:  [14-18] 18  (11/15 0510) BP: (124-139)/(47-53) 139/53 mmHg (11/15 0510) SpO2:  [95 %-99 %] 98 % (11/15 0510) Weight:  [92.534 kg (204 lb)] 92.534 kg (204 lb) (11/14 2313) Last BM Date: 10/26/12  Intake/Output from previous day: 11/14 0701 - 11/15 0700 In: 799 [I.V.:799] Out: -  Intake/Output this shift:    General appearance: alert and cooperative Resp: clear to auscultation bilaterally GI: soft, suprapubic tenderness without peritonitis, smoe BS present  Lab Results:   [facility] 10/26/12 1944 10/25/12 1157  WBC 20.1* 18.5 Repeated and verified X2.*  HGB 10.9* 11.7*  HCT 31.1* 36.6  PLT 372 416.0*   BMET  Basename 10/26/12 1944 10/25/12 1157  NA 123* 127*  K 3.6 4.2  CL 87* 89*  CO2 24 28  GLUCOSE 106* 104*  BUN 13 19  CREATININE 0.89 1.1  CALCIUM 8.2* 8.5   PT/INR  Basename 10/27/12 0855  LABPROT 15.4*  INR 1.24   ABG No results found for this basename: PHART:2,PCO2:2,PO2:2,HCO3:2 in the last 72 hours  Studies/Results: Ct Abdomen Pelvis W Contrast  10/26/2012  *RADIOLOGY REPORT*  Clinical Data: Abdominal pain.  Elevated white blood count.  Weight loss, nausea and vomiting, and anorexia.  CT ABDOMEN AND PELVIS WITH CONTRAST  Technique:  Multidetector CT imaging of the abdomen and pelvis was performed following the standard protocol during bolus administration of intravenous contrast.  Contrast: OMNIPAQUE IOHEXOL 300 MG/ML  SOLN  Comparison: Radiographs dated 10/25/2012  Findings: The patient has acute sigmoid diverticulitis with perforation with a multi septated air containing pericolonic abscess low in the pelvis measuring 9.0 x 7.2 x 6.5 cm.  There are numerous diverticula in the distal colon.  2.8 cm cyst on the otherwise normal right  ovary.  Left ovary is not appreciable. Uterus has been removed.  IMPRESSION: Acute sigmoid diverticulitis with a large complex pelvic abscess.  The patient does have bladder prolapse.   Original Report Authenticated By: Francene Boyers, M.D.    Dg Abd 2 Views  10/25/2012  *RADIOLOGY REPORT*  Clinical Data: Pain, constipation  ABDOMEN - 2 VIEW  Comparison: None.  Findings: There is nonspecific nonobstructive bowel gas pattern. Post cholecystectomy surgical clips are noted. No free abdominal air.  Mild degenerative changes lumbar spine.  IMPRESSION: Nonspecific nonobstructive bowel gas pattern.   Original Report Authenticated By: Natasha Mead, M.D.     Anti-infectives: Anti-infectives     Start     Dose/Rate Route Frequency Ordered Stop   10/27/12 1000   ciprofloxacin (CIPRO) IVPB 400 mg        400 mg 200 mL/hr over 60 Minutes Intravenous Every 12 hours 10/26/12 2314     10/27/12 0600   metroNIDAZOLE (FLAGYL) IVPB 500 mg        500 mg 100 mL/hr over 60 Minutes Intravenous Every 8 hours 10/26/12 2314     10/26/12 1900   ciprofloxacin (CIPRO) IVPB 400 mg        400 mg 200 mL/hr over 60 Minutes Intravenous  Once 10/26/12 1847 10/26/12 2148   10/26/12 1900   metroNIDAZOLE (FLAGYL) IVPB 500 mg        500 mg 100 mL/hr over 60 Minutes Intravenous  Once 10/26/12 1847 10/26/12 2041  Assessment/Plan: s/p * No surgery found * Diverticulitis with abscess - bowel rest, Cipro/Flagyl, IR to place drain today.  Hopefully she will improve and will not need colectomy/colostomy this admission.  I discussed the plan with her.  LOS: 1 day    Christelle Igoe E 10/27/2012

## 2012-10-27 NOTE — Progress Notes (Signed)
Patient ID: Jackie Stevens, female   DOB: [birth year], 66 y.o.   MRN: [id number] Request received for CT guided drainage of pelvic/diverticular abscess on pt. Imaging studies were reviewed by Dr. Grace Isaac.  PMH as below. Exam:  Pt awake/alert; chest- CTA bilat; heart- RRR; abd- obese, soft,+BS, tender LLQ; ext- FROM, no edema.   Filed Vitals:   10/26/12 1842 10/26/12 2235 10/26/12 2313 10/27/12 0510  BP: 133/53 128/47 124/53 139/53  Pulse: 108 88 99 97  Temp: 99.1 F (37.3 C) 99.6 F (37.6 C) 98.9 F (37.2 C) 99.6 F (37.6 C)  TempSrc: Oral Oral    Resp: 18 14 18 18   Height:   5\' 2"  (1.575 m)   Weight:   204 lb (92.534 kg)   SpO2: 96% 99% 95% 98%   Past Medical History  Diagnosis Date  . Allergic rhinitis   . HTN (hypertension)   . OSA (obstructive sleep apnea)   . GERD (gastroesophageal reflux disease)   . History of thyroid cancer   . Hyperlipidemia   . Chicken pox as a child  . Measles as a child  . Cancer 1980's    thyroid  . Insomnia   . Thyroid disease   . Constipation   . Diverticulitis 10/25/2012  . Abnormal cervical cytology 10/25/2012    Follows with Dr Maggie Font of Gyn   Past Surgical History  Procedure Date  . Thyroidectomy, partial 1988  . Tonsillectomy as a kid  . Total abdominal hysterectomy 70's  . Cholecystectomy 1990   Ct Abdomen Pelvis W Contrast  10/26/2012  *RADIOLOGY REPORT*  Clinical Data: Abdominal pain.  Elevated white blood count.  Weight loss, nausea and vomiting, and anorexia.  CT ABDOMEN AND PELVIS WITH CONTRAST  Technique:  Multidetector CT imaging of the abdomen and pelvis was performed following the standard protocol during bolus administration of intravenous contrast.  Contrast: OMNIPAQUE IOHEXOL 300 MG/ML  SOLN  Comparison: Radiographs dated 10/25/2012  Findings: The patient has acute sigmoid diverticulitis with perforation with a multi septated air containing pericolonic abscess low in the pelvis measuring 9.0 x 7.2 x 6.5 cm.  There  are numerous diverticula in the distal colon.  2.8 cm cyst on the otherwise normal right ovary.  Left ovary is not appreciable. Uterus has been removed.  IMPRESSION: Acute sigmoid diverticulitis with a large complex pelvic abscess.  The patient does have bladder prolapse.   Original Report Authenticated By: Francene Boyers, M.D.    Dg Abd 2 Views  10/25/2012  *RADIOLOGY REPORT*  Clinical Data: Pain, constipation  ABDOMEN - 2 VIEW  Comparison: None.  Findings: There is nonspecific nonobstructive bowel gas pattern. Post cholecystectomy surgical clips are noted. No free abdominal air.  Mild degenerative changes lumbar spine.  IMPRESSION: Nonspecific nonobstructive bowel gas pattern.   Original Report Authenticated By: Natasha Mead, M.D.   Results for orders placed during the hospital encounter of 10/26/12  CBC WITH DIFFERENTIAL      Component Value Range   WBC 20.1 (*) 4.0 - 10.5 K/uL   RBC 3.66 (*) 3.87 - 5.11 MIL/uL   Hemoglobin 10.9 (*) 12.0 - 15.0 g/dL   HCT 40.9 (*) 81.1 - 91.4 %   MCV 85.0  78.0 - 100.0 fL   MCH 29.8  26.0 - 34.0 pg   MCHC 35.0  30.0 - 36.0 g/dL   RDW 78.2  95.6 - 21.3 %   Platelets 372  150 - 400 K/uL   Neutrophils Relative 84 (*) 43 -  77 %   Neutro Abs 16.9 (*) 1.7 - 7.7 K/uL   Lymphocytes Relative 7 (*) 12 - 46 %   Lymphs Abs 1.4  0.7 - 4.0 K/uL   Monocytes Relative 9  3 - 12 %   Monocytes Absolute 1.9 (*) 0.1 - 1.0 K/uL   Eosinophils Relative 0  0 - 5 %   Eosinophils Absolute 0.0  0.0 - 0.7 K/uL   Basophils Relative 0  0 - 1 %   Basophils Absolute 0.0  0.0 - 0.1 K/uL  BASIC METABOLIC PANEL      Component Value Range   Sodium 123 (*) 135 - 145 mEq/L   Potassium 3.6  3.5 - 5.1 mEq/L   Chloride 87 (*) 96 - 112 mEq/L   CO2 24  19 - 32 mEq/L   Glucose, Bld 106 (*) 70 - 99 mg/dL   BUN 13  6 - 23 mg/dL   Creatinine, Ser 2.13  0.50 - 1.10 mg/dL   Calcium 8.2 (*) 8.4 - 10.5 mg/dL   GFR calc non Af Amer 65 (*) >90 mL/min   GFR calc Af Amer 75 (*) >90 mL/min    URINALYSIS, ROUTINE W REFLEX MICROSCOPIC      Component Value Range   Color, Urine YELLOW  YELLOW   APPearance CLEAR  CLEAR   Specific Gravity, Urine 1.036 (*) 1.005 - 1.030   pH 6.0  5.0 - 8.0   Glucose, UA NEGATIVE  NEGATIVE mg/dL   Hgb urine dipstick NEGATIVE  NEGATIVE   Bilirubin Urine SMALL (*) NEGATIVE   Ketones, ur 15 (*) NEGATIVE mg/dL   Protein, ur NEGATIVE  NEGATIVE mg/dL   Urobilinogen, UA 0.2  0.0 - 1.0 mg/dL   Nitrite NEGATIVE  NEGATIVE   Leukocytes, UA NEGATIVE  NEGATIVE  PROTIME-INR      Component Value Range   Prothrombin Time 15.4 (*) 11.6 - 15.2 seconds   INR 1.24  0.00 - 1.49   A/P: Pt with pelvic/diverticular abscess. Plan is for CT guided drainage of abscess today or 11/16. Details/risks of procedure d/w pt/family with their understanding and consent.

## 2012-10-27 NOTE — Procedures (Signed)
CT guided pelvic drainage catheter placement via left transgluteal approach.  No immediate post procedural complications.

## 2012-10-28 LAB — CBC
HCT: 27.5 % — ABNORMAL LOW (ref 36.0–46.0)
Hemoglobin: 9 g/dL — ABNORMAL LOW (ref 12.0–15.0)
MCH: 28.8 pg (ref 26.0–34.0)
MCHC: 32.7 g/dL (ref 30.0–36.0)
MCV: 87.9 fL (ref 78.0–100.0)
Platelets: 310 10*3/uL (ref 150–400)
RBC: 3.13 MIL/uL — ABNORMAL LOW (ref 3.87–5.11)
RDW: 13.5 % (ref 11.5–15.5)
WBC: 14.9 10*3/uL — ABNORMAL HIGH (ref 4.0–10.5)

## 2012-10-28 LAB — BASIC METABOLIC PANEL
BUN: 12 mg/dL (ref 6–23)
CO2: 24 mEq/L (ref 19–32)
Calcium: 8.1 mg/dL — ABNORMAL LOW (ref 8.4–10.5)
Chloride: 99 mEq/L (ref 96–112)
Creatinine, Ser: 0.99 mg/dL (ref 0.50–1.10)
GFR calc Af Amer: 66 mL/min — ABNORMAL LOW (ref >90)
GFR calc non Af Amer: 57 mL/min — ABNORMAL LOW (ref >90)
Glucose, Bld: 152 mg/dL — ABNORMAL HIGH (ref 70–99)
Potassium: 3.1 mEq/L — ABNORMAL LOW (ref 3.5–5.1)
Sodium: 131 mEq/L — ABNORMAL LOW (ref 135–145)

## 2012-10-28 MED ORDER — POTASSIUM CHLORIDE CRYS ER 20 MEQ PO TBCR
40.0000 meq | EXTENDED_RELEASE_TABLET | Freq: Two times a day (BID) | ORAL | Status: DC
  Administered 2012-10-28 – 2012-11-01 (×9): 40 meq via ORAL
  Filled 2012-10-28 (×11): qty 2

## 2012-10-28 MED ORDER — CIPROFLOXACIN IN D5W 400 MG/200ML IV SOLN
400.0000 mg | Freq: Two times a day (BID) | INTRAVENOUS | Status: DC
  Administered 2012-10-28 – 2012-11-03 (×13): 400 mg via INTRAVENOUS
  Filled 2012-10-28 (×14): qty 200

## 2012-10-28 NOTE — Progress Notes (Signed)
Subjective: Feeling some better. Ambulating in hallway.   Objective: Vital signs in last 24 hours: Temp:  [98.2 F (36.8 C)-100.9 F (38.3 C)] 98.2 F (36.8 C) (11/16 0555) Pulse Rate:  [76-91] 76  (11/16 0555) Resp:  [15-20] 16  (11/16 0555) BP: (105-138)/(41-61) 106/41 mmHg (11/16 0555) SpO2:  [94 %-100 %] 97 % (11/16 0555) Last BM Date: 10/26/12  Intake/Output from previous day: 11/15 0701 - 11/16 0700 In: 2700 [I.V.:2400; IV Piggyback:300] Out: 435 [Urine:350; Drains:85]    PE:  Ambulating, TG drain intact with purulent drainage.  Positive bowel sounds. Cultures pending with GPC in clusters and GPR currently.  85 ml output recorded since placement of drain yesterday. Improved leukocytosis post drain/antibiotic initiation.   Lab Results:   [facility] 10/28/12 0913 10/26/12 1944  WBC 14.9* 20.1*  HGB 9.0* 10.9*  HCT 27.5* 31.1*  PLT 310 372   BMET  Basename 10/28/12 0913 10/26/12 1944  NA 131* 123*  K 3.1* 3.6  CL 99 87*  CO2 24 24  GLUCOSE 152* 106*  BUN 12 13  CREATININE 0.99 0.89  CALCIUM 8.1* 8.2*   PT/INR  Basename 10/27/12 0855  LABPROT 15.4*  INR 1.24    Studies/Results: Ct Guided Abscess Drain  10/27/2012  *RADIOLOGY REPORT*  Indication: Left-sided pelvic diverticular abscess  CT GUIDED PELVIC DRAINAGE CATHETER PLACEMENT VIA LEFT SIDED TRANSGLUTEAL APPROACH  Comparison: CT abdomen pelvis - 10/26/2012  Medications: Fentanyl 50 mcg IV; Versed 2 mg IV  Total Moderate Sedation time: 25 minutes  Contrast: None  Complications: None immediate  Technique / Findings:  Informed written consent was obtained from the patient after a discussion of the risks, benefits and alternatives to treatment. The patient was placed bone on the CT gantry and a pre procedural CT was performed re-demonstrating the known abscess/fluid collection within the pelvis.  The procedure was planned.   A timeout was performed prior to the initiation of the procedure.  The left buttocks  prepped and draped in the usual sterile fashion. The overlying soft tissues were anesthetized with 1% lidocaine with epinephrine.  Utilizing a left-sided transgluteal approach, appropriate trajectory was planned with the use of a 22 gauge spinal needle.  An 18 gauge trocar needle was advanced into the abscess/fluid collection and a short Amplatz super stiff wire was coiled within the collection.   Appropriate positioning was confirmed with a limited CT scan.  The tract was serially dilated allowing placement of a 10 [place] all-purpose drainage catheter. Appropriate positioning was confirmed with a limited postprocedural CT scan.  80 ml of purulent fluid was aspirated.  The tube was connected to a drainage bag and sutured in place.  A dressing was placed.  The patient tolerated the procedure well without immediate post procedural complication.  Impression:  Successful CT guided placement of a 10 [place] all purpose drain catheter into the pelvis via left-sided transgluteal approach with aspiration of 80 mL of purulent fluid.  Samples were sent to the laboratory as requested by the ordering clinical team.   Original Report Authenticated By: Tacey Ruiz, MD    Ct Abdomen Pelvis W Contrast  10/26/2012  *RADIOLOGY REPORT*  Clinical Data: Abdominal pain.  Elevated white blood count.  Weight loss, nausea and vomiting, and anorexia.  CT ABDOMEN AND PELVIS WITH CONTRAST  Technique:  Multidetector CT imaging of the abdomen and pelvis was performed following the standard protocol during bolus administration of intravenous contrast.  Contrast: OMNIPAQUE IOHEXOL 300 MG/ML  SOLN  Comparison:  Radiographs dated 10/25/2012  Findings: The patient has acute sigmoid diverticulitis with perforation with a multi septated air containing pericolonic abscess low in the pelvis measuring 9.0 x 7.2 x 6.5 cm.  There are numerous diverticula in the distal colon.  2.8 cm cyst on the otherwise normal right ovary.  Left ovary is not  appreciable. Uterus has been removed.  IMPRESSION: Acute sigmoid diverticulitis with a large complex pelvic abscess.  The patient does have bladder prolapse.   Original Report Authenticated By: Francene Boyers, M.D.     Anti-infectives: Anti-infectives     Start     Dose/Rate Route Frequency Ordered Stop   10/28/12 0800   ciprofloxacin (CIPRO) IVPB 400 mg        400 mg 200 mL/hr over 60 Minutes Intravenous 2 times daily 10/28/12 0724     10/27/12 1000   ciprofloxacin (CIPRO) IVPB 400 mg  Status:  Discontinued        400 mg 200 mL/hr over 60 Minutes Intravenous Every 12 hours 10/26/12 2314 10/27/12 1359   10/27/12 0600   metroNIDAZOLE (FLAGYL) IVPB 500 mg        500 mg 100 mL/hr over 60 Minutes Intravenous Every 8 hours 10/26/12 2314     10/26/12 1900   ciprofloxacin (CIPRO) IVPB 400 mg        400 mg 200 mL/hr over 60 Minutes Intravenous  Once 10/26/12 1847 10/26/12 2148   10/26/12 1900   metroNIDAZOLE (FLAGYL) IVPB 500 mg        500 mg 100 mL/hr over 60 Minutes Intravenous  Once 10/26/12 1847 10/26/12 2041          Assessment/Plan: Diverticular abscess s/p percutaneous drainage by IR via transgluteal approach. Purulent drainage at present with cultures pending.  IR to continue to follow.     LOS: 2 days    CAMPBELL,PAMELA D 10/28/2012

## 2012-10-28 NOTE — Progress Notes (Addendum)
Subjective: Stable and alert. Feels a little better.  Underwent percutaneous drainage of diverticular abscess via left transgluteal approach yesterday. Purulent fluid aspirated. Drain and draining well.  She is passing some flatus and a little bit of liquid stool but not much. No vomiting. Minimal nausea.  Objective: Vital signs in last 24 hours: Temp:  [98.2 F (36.8 C)-100.9 F (38.3 C)] 98.2 F (36.8 C) (11/16 0555) Pulse Rate:  [76-91] 76  (11/16 0555) Resp:  [15-20] 16  (11/16 0555) BP: (105-138)/(41-61) 106/41 mmHg (11/16 0555) SpO2:  [94 %-100 %] 97 % (11/16 0555) Last BM Date: 10/26/12  Intake/Output from previous day: 11/15 0701 - 11/16 0700 In: 2700 [I.V.:2400; IV Piggyback:300] Out: 435 [Urine:350; Drains:85] Intake/Output this shift:    General appearance: alert. Essentially no distress. Minimal status normal. GI: abdomen obese. Mostly soft. Mild tenderness suprapubic area. No mass. Drainage purulent.  Lab Results:   [facility] 10/28/12 0913 10/26/12 1944  WBC 14.9* 20.1*  HGB 9.0* 10.9*  HCT 27.5* 31.1*  PLT 310 372   BMET  Basename 10/28/12 0913 10/26/12 1944  NA 131* 123*  K 3.1* 3.6  CL 99 87*  CO2 24 24  GLUCOSE 152* 106*  BUN 12 13  CREATININE 0.99 0.89  CALCIUM 8.1* 8.2*   PT/INR  Basename 10/27/12 0855  LABPROT 15.4*  INR 1.24   ABG No results found for this basename: PHART:2,PCO2:2,PO2:2,HCO3:2 in the last 72 hours  Studies/Results: Ct Guided Abscess Drain  10/27/2012  *RADIOLOGY REPORT*  Indication: Left-sided pelvic diverticular abscess  CT GUIDED PELVIC DRAINAGE CATHETER PLACEMENT VIA LEFT SIDED TRANSGLUTEAL APPROACH  Comparison: CT abdomen pelvis - 10/26/2012  Medications: Fentanyl 50 mcg IV; Versed 2 mg IV  Total Moderate Sedation time: 25 minutes  Contrast: None  Complications: None immediate  Technique / Findings:  Informed written consent was obtained from the patient after a discussion of the risks, benefits and alternatives  to treatment. The patient was placed bone on the CT gantry and a pre procedural CT was performed re-demonstrating the known abscess/fluid collection within the pelvis.  The procedure was planned.   A timeout was performed prior to the initiation of the procedure.  The left buttocks prepped and draped in the usual sterile fashion. The overlying soft tissues were anesthetized with 1% lidocaine with epinephrine.  Utilizing a left-sided transgluteal approach, appropriate trajectory was planned with the use of a 22 gauge spinal needle.  An 18 gauge trocar needle was advanced into the abscess/fluid collection and a short Amplatz super stiff wire was coiled within the collection.   Appropriate positioning was confirmed with a limited CT scan.  The tract was serially dilated allowing placement of a 10 [place] all-purpose drainage catheter. Appropriate positioning was confirmed with a limited postprocedural CT scan.  80 ml of purulent fluid was aspirated.  The tube was connected to a drainage bag and sutured in place.  A dressing was placed.  The patient tolerated the procedure well without immediate post procedural complication.  Impression:  Successful CT guided placement of a 10 [place] all purpose drain catheter into the pelvis via left-sided transgluteal approach with aspiration of 80 mL of purulent fluid.  Samples were sent to the laboratory as requested by the ordering clinical team.   Original Report Authenticated By: Tacey Ruiz, MD    Ct Abdomen Pelvis W Contrast  10/26/2012  *RADIOLOGY REPORT*  Clinical Data: Abdominal pain.  Elevated white blood count.  Weight loss, nausea and vomiting, and anorexia.  CT  ABDOMEN AND PELVIS WITH CONTRAST  Technique:  Multidetector CT imaging of the abdomen and pelvis was performed following the standard protocol during bolus administration of intravenous contrast.  Contrast: OMNIPAQUE IOHEXOL 300 MG/ML  SOLN  Comparison: Radiographs dated 10/25/2012  Findings: The patient  has acute sigmoid diverticulitis with perforation with a multi septated air containing pericolonic abscess low in the pelvis measuring 9.0 x 7.2 x 6.5 cm.  There are numerous diverticula in the distal colon.  2.8 cm cyst on the otherwise normal right ovary.  Left ovary is not appreciable. Uterus has been removed.  IMPRESSION: Acute sigmoid diverticulitis with a large complex pelvic abscess.  The patient does have bladder prolapse.   Original Report Authenticated By: Francene Boyers, M.D.     Anti-infectives: Anti-infectives     Start     Dose/Rate Route Frequency Ordered Stop   10/28/12 0800   ciprofloxacin (CIPRO) IVPB 400 mg        400 mg 200 mL/hr over 60 Minutes Intravenous 2 times daily 10/28/12 0724     10/27/12 1000   ciprofloxacin (CIPRO) IVPB 400 mg  Status:  Discontinued        400 mg 200 mL/hr over 60 Minutes Intravenous Every 12 hours 10/26/12 2314 10/27/12 1359   10/27/12 0600   metroNIDAZOLE (FLAGYL) IVPB 500 mg        500 mg 100 mL/hr over 60 Minutes Intravenous Every 8 hours 10/26/12 2314     10/26/12 1900   ciprofloxacin (CIPRO) IVPB 400 mg        400 mg 200 mL/hr over 60 Minutes Intravenous  Once 10/26/12 1847 10/26/12 2148   10/26/12 1900   metroNIDAZOLE (FLAGYL) IVPB 500 mg        500 mg 100 mL/hr over 60 Minutes Intravenous  Once 10/26/12 1847 10/26/12 2041          Assessment/Plan:  Sigmoid diverticulitis with abscess. Slowly improving on antibiotics, bowel rest, and one day following percutaneous abscess drainage.Stable vital signs and declining leukocytosis encouraging.  Will allow clear liquids, encourage ambulation,Continue antibiotics.  Heparin for DVT prophylaxis  Check labs on Monday.   LOS: 2 days    Andreia Gandolfi M 10/28/2012

## 2012-10-28 NOTE — Progress Notes (Signed)
PROGRESS NOTE  Jackie Stevens ZOX:[id number] DOB: 1946/09/24 DOA: 10/26/2012 PCP: Danise Edge, MD  Brief narrative: 66 yr old CF admitted from PCP office with a h/o intractable n/v/ diarrhea.  Of note, she went to her Ob-Gyn on 10/23 or 10/24 and received a 2 week dose of Macrobid for a presumed UTI.  She was found on CT scan on admission to have a diverticular abscess and then had a percutaneous drainage by interventional radiology 10/27/2012 which yielded frank pus.  Past medical history-As per Problem list Chart reviewed as below-   Admission 07/18/11 with non-cardiogenic CP  See Dr. Levonne Spiller young for OSA-Bedtime 1030-1200 MN, latency 10 minutes, up 530- 630 AM. Diagnostic NPSGat [facility] 07/15/10- AHI 13.5/hr. CPAP titration at [facility] facility 08/25/10- CPAP 10> AHI 0/hr.  H/o thyroid cancer c Partial thyroidectomy RAI rx  H/o asthma  H/o abnormal cervical cytology  Consultants:  Interventional radiology  Gen surgery  Procedures:  2 view abdominal xray=non-obstructive bowel gas pattern  Acute sigmoid diverticulitis with a large complex pelvic abscess. The patient does have bladder prolapse.   Antibiotics:  Cipro 11/14>>  Flagyl 11/14 >>   Subjective  States she has abdominal pain. States the pain is 2/10. Last night developed tachycardia and a fever of 100.9. She was hemodynamically stable and I reviewed her at that time. She states that she's feeling better today however she feels a little depressed. She really wants to drink and eat. No cough no cold no fever.   Objective    Interim History: Reviewed chart  Telemetry: Normal sinus rhythm no red alarms  Objective: Filed Vitals:   10/27/12 1746 10/27/12 2149 10/27/12 2351 10/28/12 0555  BP:  105/48  106/41  Pulse:  86 77 76  Temp: 100.9 F (38.3 C) 98.2 F (36.8 C)  98.2 F (36.8 C)  TempSrc: Oral Oral  Oral  Resp:  18 18 16   Height:      Weight:      SpO2:  95% 98% 97%    Intake/Output Summary (Last  24 hours) at 10/28/12 8119 Last data filed at 10/28/12 0600  Gross per 24 hour  Intake   2700 ml  Output    435 ml  Net   2265 ml    Exam:  General: Pleasant alert oriented Caucasian female no apparent distress Cardiovascular: S1-S2? 3/6 early systolic murmur left lower sternal edge, mild cardiomegaly by exam Respiratory: Clinically clear no added sound, no tactile vocal resonance or fremitus Abdomen: Slightly tender in lower quadrant no rebound guarding. Bowel sounds increased from yesterday's visit, Abdomen seems less firm than it was in the past and less distended Skin no lower extremity edema Neuro motor grossly intact  Data Reviewed: Basic Metabolic Panel:  Lab 10/26/12 1478 10/25/12 1157  NA 123* 127*  K 3.6 4.2  CL 87* 89*  CO2 24 28  GLUCOSE 106* 104*  BUN 13 19  CREATININE 0.89 1.1  CALCIUM 8.2* 8.5  MG -- --  PHOS -- 3.8   Liver Function Tests:  Lab 10/25/12 1157  AST 28  ALT 54*  ALKPHOS 136*  BILITOT 0.3  PROT 7.3  ALBUMIN 3.1*3.1*   No results found for this basename: LIPASE:5,AMYLASE:5 in the last 168 hours No results found for this basename: AMMONIA:5 in the last 168 hours CBC:  Lab 10/26/12 1944 10/25/12 1157  WBC 20.1* 18.5 Repeated and verified X2.*  NEUTROABS 16.9* --  HGB 10.9* 11.7*  HCT 31.1* 36.6  MCV 85.0 90.8  PLT 372 416.0*   Cardiac Enzymes: No results found for this basename: CKTOTAL:5,CKMB:5,CKMBINDEX:5,TROPONINI:5 in the last 168 hours BNP: No components found with this basename: POCBNP:5 CBG: No results found for this basename: GLUCAP:5 in the last 168 hours  Recent Results (from the past 240 hour(s))  URINE CULTURE     Status: Normal   Collection Time   10/25/12  1:39 PM      Component Value Range Status Comment   Colony Count 60,000 COLONIES/ML   Final    Organism ID, Bacteria Multiple bacterial morphotypes present, none   Final    Organism ID, Bacteria predominant. Suggest appropriate recollection if    Final     Organism ID, Bacteria clinically indicated.   Final   CULTURE, ROUTINE-ABSCESS     Status: Normal (Preliminary result)   Collection Time   10/27/12  4:34 PM      Component Value Range Status Comment   Specimen Description ABSCESS DRAINAGE   Final    Special Requests DIVERTICULAR ABSCESS   Final    Gram Stain     Final    Value: MODERATE WBC PRESENT,BOTH PMN AND MONONUCLEAR     NO SQUAMOUS EPITHELIAL CELLS SEEN     MODERATE GRAM POSITIVE COCCI IN CLUSTERS     IN PAIRS FEW GRAM POSITIVE RODS   Culture Culture reincubated for better growth   Final    Report Status PENDING   Incomplete      Studies:              All Imaging reviewed and is as per above notation   Scheduled Meds:    . [COMPLETED] chlorhexidine      . ciprofloxacin  400 mg Intravenous BID  . [EXPIRED] fentaNYL      . heparin  5,000 Units Subcutaneous Q8H  .  HYDROmorphone (DILAUDID) injection  1 mg Intravenous Q2H  . levothyroxine  150 mcg Oral Custom  . levothyroxine  225 mcg Oral Custom  . metronidazole  500 mg Intravenous Q8H  . [EXPIRED] midazolam      . [EXPIRED] white petrolatum      . [DISCONTINUED] ciprofloxacin  400 mg Intravenous Q12H  . [DISCONTINUED] pantoprazole  40 mg Oral Daily  . [DISCONTINUED] simvastatin  40 mg Oral QHS   Continuous Infusions:    . dextrose 5 % and 0.9% NaCl 100 mL/hr at 10/28/12 1610     Assessment/Plan: 1. ? Diverticulitis differential diagnoses = C. difficile colitis-patient was on Macrobid for 2 weeks and has been on only resolve twice a day. Feel it is prudent to get a C. difficile panel. Patient was restarted on ciprofloxacin for a diverticular abscess coverage -patient has passed plain water in her stool and C. difficile has not been collected as yet -need to be off of her proton pump inhibitor. Made her IV Dilaudid scheduled every 2 hourly-patient can decline if she needs this-given her slight fever and tachycardia 11/15, blood cultures were done and are pending. 2. ?  Diverticular abscess day #1 status post percutaneous drainage-cultures have been ordered and are pending. CBC and basic metabolic panel are pending.-Gram stain shows gram-positive cocci-continue above antibiotics 3. Elevated liver enzymes-probably secondary to sepsis. Monitor in one to 2 days with LFTs 4. Nausea vomiting-added Phenergan 12.5 to her scheduled Zofran. Patient really wants to eat and drink. I have counseled her we probably need to go very slowly-she has nausea at rest and understands not to drink or eat fluids if she does  feel nauseous 5. 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 History of obstructive sleep apnea-will place her on AutoPap-respiratory therapist to determine settings 7. History of abnormal cervical cytology-per Dr. Elijah Birk Estrace 0.1 mg  8. HLD-hold statin for now 9. Htn-given volume depletion and diarroea, hold Diovan-hct 320-25 for now 10. Hyponatremia-probably 2/2 to diuretic and ongoing GI losses.  Follow [facility] in am  Code Status: Full Family Communication: None at bedside this morning y Disposition Plan: Inpatient   Pleas Koch, MD  [facility] Pager [phone] 10/28/2012, 9:29 AM    LOS: 2 days

## 2012-10-29 LAB — CBC WITH DIFFERENTIAL/PLATELET
Basophils Absolute: 0 10*3/uL (ref 0.0–0.1)
Basophils Relative: 0 % (ref 0–1)
Eosinophils Absolute: 0.3 10*3/uL (ref 0.0–0.7)
Eosinophils Relative: 2 % (ref 0–5)
HCT: 27.1 % — ABNORMAL LOW (ref 36.0–46.0)
Hemoglobin: 9.1 g/dL — ABNORMAL LOW (ref 12.0–15.0)
Lymphocytes Relative: 14 % (ref 12–46)
Lymphs Abs: 1.9 10*3/uL (ref 0.7–4.0)
MCH: 29.4 pg (ref 26.0–34.0)
MCHC: 33.6 g/dL (ref 30.0–36.0)
MCV: 87.7 fL (ref 78.0–100.0)
Monocytes Absolute: 0.8 10*3/uL (ref 0.1–1.0)
Monocytes Relative: 6 % (ref 3–12)
Neutro Abs: 10.8 10*3/uL — ABNORMAL HIGH (ref 1.7–7.7)
Neutrophils Relative %: 78 % — ABNORMAL HIGH (ref 43–77)
Platelets: 318 10*3/uL (ref 150–400)
RBC: 3.09 MIL/uL — ABNORMAL LOW (ref 3.87–5.11)
RDW: 13.7 % (ref 11.5–15.5)
WBC: 13.8 10*3/uL — ABNORMAL HIGH (ref 4.0–10.5)

## 2012-10-29 LAB — COMPREHENSIVE METABOLIC PANEL
ALT: 37 U/L — ABNORMAL HIGH (ref 0–35)
AST: 20 U/L (ref 0–37)
Albumin: 1.9 g/dL — ABNORMAL LOW (ref 3.5–5.2)
Alkaline Phosphatase: 112 U/L (ref 39–117)
BUN: 8 mg/dL (ref 6–23)
CO2: 24 mEq/L (ref 19–32)
Calcium: 7.7 mg/dL — ABNORMAL LOW (ref 8.4–10.5)
Chloride: 99 mEq/L (ref 96–112)
Creatinine, Ser: 0.81 mg/dL (ref 0.50–1.10)
GFR calc Af Amer: 85 mL/min — ABNORMAL LOW (ref >90)
GFR calc non Af Amer: 73 mL/min — ABNORMAL LOW (ref >90)
Glucose, Bld: 120 mg/dL — ABNORMAL HIGH (ref 70–99)
Potassium: 3.8 mEq/L (ref 3.5–5.1)
Sodium: 132 mEq/L — ABNORMAL LOW (ref 135–145)
Total Bilirubin: 0.1 mg/dL — ABNORMAL LOW (ref 0.3–1.2)
Total Protein: 5.2 g/dL — ABNORMAL LOW (ref 6.0–8.3)

## 2012-10-29 LAB — CLOSTRIDIUM DIFFICILE BY PCR: Toxigenic C. Difficile by PCR: NEGATIVE

## 2012-10-29 MED ORDER — ACETAMINOPHEN-CODEINE #4 300-60 MG PO TABS: 1.0000 | ORAL_TABLET | ORAL | Status: DC | PRN

## 2012-10-29 MED ORDER — CODEINE SULFATE 15 MG PO TABS
30.0000 mg | ORAL_TABLET | ORAL | Status: DC | PRN
  Administered 2012-10-29 – 2012-10-30 (×3): 30 mg via ORAL
  Filled 2012-10-29 (×3): qty 2

## 2012-10-29 MED ORDER — ACETAMINOPHEN-CODEINE #3 300-30 MG PO TABS
1.0000 | ORAL_TABLET | ORAL | Status: DC | PRN
  Administered 2012-10-29: 2 via ORAL
  Administered 2012-10-29 – 2012-10-30 (×3): 1 via ORAL
  Filled 2012-10-29 (×3): qty 1
  Filled 2012-10-29: qty 2

## 2012-10-29 NOTE — Progress Notes (Signed)
Subjective: Feeling some better. Nausea being better controlled. Ambulating as tolerated.   Objective: Vital signs in last 24 hours: Temp:  [98.2 F (36.8 C)-98.6 F (37 C)] 98.4 F (36.9 C) (11/17 0610) Pulse Rate:  [75-77] 77  (11/17 0610) Resp:  [16-18] 18  (11/17 0610) BP: (109-125)/(41-54) 125/48 mmHg (11/17 0610) SpO2:  [95 %-100 %] 95 % (11/17 0610) Last BM Date: 10/26/12  Intake/Output from previous day: 11/16 0701 - 11/17 0700 In: 361 [P.O.:360] Out: 325 [Urine:300; Drains:25]    PE:  Drain intact with mild soreness at insertion site. Greenish output today. Output has begun to decline over last few days, 85 ml output 11/15, 25 ml output on 11/16, appx. 10ml upon exam so far.  Cultures still pending at this time.  Abdomen is soft, tender pelvic area with positive bowel signs.    Lab Results:   [facility] 10/29/12 0454 10/28/12 0913  WBC 13.8* 14.9*  HGB 9.1* 9.0*  HCT 27.1* 27.5*  PLT 318 310   BMET  Basename 10/29/12 0454 10/28/12 0913  NA 132* 131*  K 3.8 3.1*  CL 99 99  CO2 24 24  GLUCOSE 120* 152*  BUN 8 12  CREATININE 0.81 0.99  CALCIUM 7.7* 8.1*   PT/INR  Basename 10/27/12 0855  LABPROT 15.4*  INR 1.24    Studies/Results: Ct Guided Abscess Drain  10/27/2012  *RADIOLOGY REPORT*  Indication: Left-sided pelvic diverticular abscess  CT GUIDED PELVIC DRAINAGE CATHETER PLACEMENT VIA LEFT SIDED TRANSGLUTEAL APPROACH  Comparison: CT abdomen pelvis - 10/26/2012  Medications: Fentanyl 50 mcg IV; Versed 2 mg IV  Total Moderate Sedation time: 25 minutes  Contrast: None  Complications: None immediate  Technique / Findings:  Informed written consent was obtained from the patient after a discussion of the risks, benefits and alternatives to treatment. The patient was placed bone on the CT gantry and a pre procedural CT was performed re-demonstrating the known abscess/fluid collection within the pelvis.  The procedure was planned.   A timeout was performed prior  to the initiation of the procedure.  The left buttocks prepped and draped in the usual sterile fashion. The overlying soft tissues were anesthetized with 1% lidocaine with epinephrine.  Utilizing a left-sided transgluteal approach, appropriate trajectory was planned with the use of a 22 gauge spinal needle.  An 18 gauge trocar needle was advanced into the abscess/fluid collection and a short Amplatz super stiff wire was coiled within the collection.   Appropriate positioning was confirmed with a limited CT scan.  The tract was serially dilated allowing placement of a 10 [place] all-purpose drainage catheter. Appropriate positioning was confirmed with a limited postprocedural CT scan.  80 ml of purulent fluid was aspirated.  The tube was connected to a drainage bag and sutured in place.  A dressing was placed.  The patient tolerated the procedure well without immediate post procedural complication.  Impression:  Successful CT guided placement of a 10 [place] all purpose drain catheter into the pelvis via left-sided transgluteal approach with aspiration of 80 mL of purulent fluid.  Samples were sent to the laboratory as requested by the ordering clinical team.   Original Report Authenticated By: Tacey Ruiz, MD     Anti-infectives: Anti-infectives     Start     Dose/Rate Route Frequency Ordered Stop   10/28/12 0800   ciprofloxacin (CIPRO) IVPB 400 mg        400 mg 200 mL/hr over 60 Minutes Intravenous 2 times daily 10/28/12 0724  10/27/12 1000   ciprofloxacin (CIPRO) IVPB 400 mg  Status:  Discontinued        400 mg 200 mL/hr over 60 Minutes Intravenous Every 12 hours 10/26/12 2314 10/27/12 1359   10/27/12 0600   metroNIDAZOLE (FLAGYL) IVPB 500 mg        500 mg 100 mL/hr over 60 Minutes Intravenous Every 8 hours 10/26/12 2314     10/26/12 1900   ciprofloxacin (CIPRO) IVPB 400 mg        400 mg 200 mL/hr over 60 Minutes Intravenous  Once 10/26/12 1847 10/26/12 2148   10/26/12 1900   metroNIDAZOLE  (FLAGYL) IVPB 500 mg        500 mg 100 mL/hr over 60 Minutes Intravenous  Once 10/26/12 1847 10/26/12 2041          Assessment/Plan: Presumed diverticular abscess s/p percutaneous drainage 11/15.  Output declining with high suggestive preliminary cultures and output of drain for fistula.  Would repeat CT later in the week with injection of drain once output has been documented to be down and cultures finalized.  IR to continue to follow daily along with surgery.     LOS: 3 days    CAMPBELL,PAMELA D 10/29/2012

## 2012-10-29 NOTE — Progress Notes (Addendum)
PROGRESS NOTE  Jackie Stevens PPI:[id number] DOB: January 02, 1946 DOA: 10/26/2012 PCP: Danise Edge, MD  Brief narrative: 66 yr old CF admitted from PCP office with a h/o intractable n/v/ diarrhea.  Of note, she went to her Ob-Gyn on 10/23 or 10/24 and received a 2 week dose of Macrobid for a presumed UTI.  She was found on CT scan on admission to have a diverticular abscess and then had a percutaneous drainage by interventional radiology 10/27/2012 which yielded frank pus. Patient did well subsequent drainage placement and hasn't followed by general surgery and interventional radiology closely. The drain has put out progressively less and less purulent material and general surgery is of the opinion patient will benefit from a repeat CT scan to ensure absence has been completely drained versus whether this communicates with intraluminal in with the colon. Patient will continue IV antibiotics until further imaging is done. Patient diet was graduated from clear liquids as tolerated on 10/29/2029   Past medical history-As per Problem list Chart reviewed as below-   Admission 07/18/11 with non-cardiogenic CP  See Dr. Fannie Knee for OSA-Bedtime 1030-1200 MN, latency 10 minutes, up 530- 630 AM. Diagnostic NPSGat [facility] 07/15/10- AHI 13.5/hr. CPAP titration at [facility] facility 08/25/10- CPAP 10> AHI 0/hr.  H/o thyroid cancer c Partial thyroidectomy RAI rx  H/o asthma  H/o abnormal cervical cytology  Consultants:  Interventional radiology  Gen surgery  Procedures:  2 view abdominal xray=non-obstructive bowel gas pattern  Acute sigmoid diverticulitis with a large complex pelvic abscess. The patient does have bladder prolapse.   Antibiotics:  Cipro 11/14>>  Flagyl 11/14 >>   Subjective  No abdominal pain, ambulate, slight pain in the back where the drain is. No nausea no vomiting no shortness of breath-states she's had loose stool which is tan colored, not bloody, not green, not foul-smelling  and Is still loose.. Asks at bedside if she can have coffee with milk. States she was slightly dizzy this morning on ambulation and getting up out of bed, however bedside when I ask her to stand she does not feel dizzy.   Objective    Interim History: Reviewed chart  Telemetry: Normal sinus rhythm no red alarms-telemetry discontinued 10/29/2012  Objective: Filed Vitals:   10/28/12 1430 10/28/12 2133 10/28/12 2329 10/29/12 0610  BP: 109/54 122/41  125/48  Pulse: 75 77 75 77  Temp: 98.6 F (37 C) 98.2 F (36.8 C)  98.4 F (36.9 C)  TempSrc: Oral Oral  Oral  Resp: 16 18 16 18   Height:      Weight:      SpO2: 98% 99% 100% 95%    Intake/Output Summary (Last 24 hours) at 10/29/12 0956 Last data filed at 10/29/12 0630  Gross per 24 hour  Intake    360 ml  Output     25 ml  Net    335 ml    Exam:  General: Pleasant alert oriented Caucasian female no apparent distress Cardiovascular: S1-S2? 3/6 early systolic murmur left lower sternal edge, mild cardiomegaly by exam-? Radiation 2 right carotid artery Respiratory: Clinically clear no added sound, no tactile vocal resonance or fremitus Abdomen: Nontender nondistended no rebound no guarding even with firm pressure Skin no lower extremity edema Neuro motor grossly intact  Data Reviewed: Basic Metabolic Panel:  Lab 10/29/12 1601 10/28/12 0913 10/26/12 1944 10/25/12 1157  NA 132* 131* 123* 127*  K 3.8 3.1* -- --  CL 99 99 87* 89*  CO2 24 24 24 28   GLUCOSE  120* 152* 106* 104*  BUN 8 12 13 19   CREATININE 0.81 0.99 0.89 1.1  CALCIUM 7.7* 8.1* 8.2* 8.5  MG -- -- -- --  PHOS -- -- -- 3.8   Liver Function Tests:  Lab 10/29/12 0454 10/25/12 1157  AST 20 28  ALT 37* 54*  ALKPHOS 112 136*  BILITOT 0.1* 0.3  PROT 5.2* 7.3  ALBUMIN 1.9* 3.1*3.1*   No results found for this basename: LIPASE:5,AMYLASE:5 in the last 168 hours No results found for this basename: AMMONIA:5 in the last 168 hours CBC:  Lab 10/29/12 0454  10/28/12 0913 10/26/12 1944 10/25/12 1157  WBC 13.8* 14.9* 20.1* 18.5 Repeated and verified X2.*  NEUTROABS 10.8* -- 16.9* --  HGB 9.1* 9.0* 10.9* 11.7*  HCT 27.1* 27.5* 31.1* 36.6  MCV 87.7 87.9 85.0 90.8  PLT 318 310 372 416.0*   Cardiac Enzymes: No results found for this basename: CKTOTAL:5,CKMB:5,CKMBINDEX:5,TROPONINI:5 in the last 168 hours BNP: No components found with this basename: POCBNP:5 CBG: No results found for this basename: GLUCAP:5 in the last 168 hours  Recent Results (from the past 240 hour(s))  URINE CULTURE     Status: Normal   Collection Time   10/25/12  1:39 PM      Component Value Range Status Comment   Colony Count 60,000 COLONIES/ML   Final    Organism ID, Bacteria Multiple bacterial morphotypes present, none   Final    Organism ID, Bacteria predominant. Suggest appropriate recollection if    Final    Organism ID, Bacteria clinically indicated.   Final   CULTURE, ROUTINE-ABSCESS     Status: Normal (Preliminary result)   Collection Time   10/27/12  4:34 PM      Component Value Range Status Comment   Specimen Description ABSCESS DRAINAGE   Final    Special Requests DIVERTICULAR ABSCESS   Final    Gram Stain     Final    Value: MODERATE WBC PRESENT,BOTH PMN AND MONONUCLEAR     NO SQUAMOUS EPITHELIAL CELLS SEEN     MODERATE GRAM POSITIVE COCCI IN CLUSTERS     IN PAIRS FEW GRAM POSITIVE RODS   Culture Culture reincubated for better growth   Final    Report Status PENDING   Incomplete      Studies:              All Imaging reviewed and is as per above notation   Scheduled Meds:    . ciprofloxacin  400 mg Intravenous BID  . heparin  5,000 Units Subcutaneous Q8H  .  HYDROmorphone (DILAUDID) injection  1 mg Intravenous Q2H  . levothyroxine  150 mcg Oral Custom  . levothyroxine  225 mcg Oral Custom  . metronidazole  500 mg Intravenous Q8H  . potassium chloride  40 mEq Oral BID   Continuous Infusions:    . dextrose 5 % and 0.9% NaCl 100 mL/hr at  10/29/12 4098     Assessment/Plan: 1. ? Diverticulitis differential diagnoses = C. difficile colitis-patient was on Macrobid for 2 weeks and has been on only resolve twice a day. Feel it is prudent to get a C. difficile panel. Patient was restarted on ciprofloxacin for a diverticular abscess coverage -patient has passed plain water in her stool and C. difficile has not been collected as yet -need to be off of her proton pump inhibitor. She has no further abdominal pain, IV Dilantin has been discontinued completely-I. will gradually her diet as mentioned above 2 a  full diet as she can tolerate. Will cut back IV fluids to [facility] 11/18 she has no further dizziness. 2. ? Diverticular abscess day #2 status post percutaneous drainage via transgluteal approach-cultures have been ordered and are pending.  3. Elevated liver enzymes-probably secondary to sepsis. Monitor in one to 2 days with LFTs 4. Nausea vomiting-added Phenergan 12.5 to her scheduled Zofran.I have counseled her we probably need to go very slowly-she has nausea at rest and understands not to drink or eat fluids if she does feel nauseous-diet graduated 11/17 5. 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 History of obstructive sleep apnea-will place her on AutoPap-respiratory therapist to determine settings 7. History of abnormal cervical cytology-per Dr. Elijah Birk Estrace 0.1 mg  8. HLD-hold statin for now 9. Hypokalemia-probably secondary to ongoing diarrheal losses, continue potassium chloride 40 twice a day 10. Htn-given volume depletion and diarroea, hold Diovan-hct 320-25 for now 11. Mild dizziness-further discussion with patient reveals this was caught chronic and occasionally happens. Given her blood pressure was slightly low we will monitor this 12. Hyponatremia-probably 2/2 to diuretic and ongoing GI losses.  Follow Bmet in  am-improving slowly. Repeat labs in the morning  Code Status: Full Family Communication: Son at bedside with patient-updated fully. Disposition Plan: Inpatient   Pleas Koch, MD  [facility] Pager [phone] 10/29/2012, 9:56 AM    LOS: 3 days

## 2012-10-29 NOTE — Progress Notes (Signed)
Patient has home unit.  She is not ready for bed at this time.

## 2012-10-29 NOTE — Progress Notes (Signed)
Subjective: Feels better today. Denies pain at rest other than drain site left gluteal area. No nausea or vomiting. Tolerating liquids. States she has passed no flatus or stool in the last 24 hours.  Drainage decreased to 25 cc per day.  Drainage does have a greenish enteric look to it now.  Objective: Vital signs in last 24 hours: Temp:  [98.2 F (36.8 C)-98.6 F (37 C)] 98.4 F (36.9 C) (11/17 0610) Pulse Rate:  [75-77] 77  (11/17 0610) Resp:  [16-18] 18  (11/17 0610) BP: (109-125)/(41-54) 125/48 mmHg (11/17 0610) SpO2:  [95 %-100 %] 95 % (11/17 0610) Last BM Date: 10/26/12  Intake/Output from previous day: 11/16 0701 - 11/17 0700 In: 361 [P.O.:360] Out: 325 [Urine:300; Drains:25] Intake/Output this shift:    General appearance: alert. Mental status normal. Friendly. In no distress. GI: abdomen obese, soft, generally nontender. Perhaps a little uncomfortable to deep palpation suprapubic area. No mass.  Lab Results:   [facility] 10/29/12 0454 10/28/12 0913  WBC 13.8* 14.9*  HGB 9.1* 9.0*  HCT 27.1* 27.5*  PLT 318 310   BMET  Basename 10/29/12 0454 10/28/12 0913  NA 132* 131*  K 3.8 3.1*  CL 99 99  CO2 24 24  GLUCOSE 120* 152*  BUN 8 12  CREATININE 0.81 0.99  CALCIUM 7.7* 8.1*   PT/INR  Basename 10/27/12 0855  LABPROT 15.4*  INR 1.24   ABG No results found for this basename: PHART:2,PCO2:2,PO2:2,HCO3:2 in the last 72 hours  Studies/Results: Ct Guided Abscess Drain  10/27/2012  *RADIOLOGY REPORT*  Indication: Left-sided pelvic diverticular abscess  CT GUIDED PELVIC DRAINAGE CATHETER PLACEMENT VIA LEFT SIDED TRANSGLUTEAL APPROACH  Comparison: CT abdomen pelvis - 10/26/2012  Medications: Fentanyl 50 mcg IV; Versed 2 mg IV  Total Moderate Sedation time: 25 minutes  Contrast: None  Complications: None immediate  Technique / Findings:  Informed written consent was obtained from the patient after a discussion of the risks, benefits and alternatives to treatment.  The patient was placed bone on the CT gantry and a pre procedural CT was performed re-demonstrating the known abscess/fluid collection within the pelvis.  The procedure was planned.   A timeout was performed prior to the initiation of the procedure.  The left buttocks prepped and draped in the usual sterile fashion. The overlying soft tissues were anesthetized with 1% lidocaine with epinephrine.  Utilizing a left-sided transgluteal approach, appropriate trajectory was planned with the use of a 22 gauge spinal needle.  An 18 gauge trocar needle was advanced into the abscess/fluid collection and a short Amplatz super stiff wire was coiled within the collection.   Appropriate positioning was confirmed with a limited CT scan.  The tract was serially dilated allowing placement of a 10 [place] all-purpose drainage catheter. Appropriate positioning was confirmed with a limited postprocedural CT scan.  80 ml of purulent fluid was aspirated.  The tube was connected to a drainage bag and sutured in place.  A dressing was placed.  The patient tolerated the procedure well without immediate post procedural complication.  Impression:  Successful CT guided placement of a 10 [place] all purpose drain catheter into the pelvis via left-sided transgluteal approach with aspiration of 80 mL of purulent fluid.  Samples were sent to the laboratory as requested by the ordering clinical team.   Original Report Authenticated By: Tacey Ruiz, MD     Anti-infectives: Anti-infectives     Start     Dose/Rate Route Frequency Ordered Stop   10/28/12 0800  ciprofloxacin (CIPRO) IVPB 400 mg        400 mg 200 mL/hr over 60 Minutes Intravenous 2 times daily 10/28/12 0724     10/27/12 1000   ciprofloxacin (CIPRO) IVPB 400 mg  Status:  Discontinued        400 mg 200 mL/hr over 60 Minutes Intravenous Every 12 hours 10/26/12 2314 10/27/12 1359   10/27/12 0600   metroNIDAZOLE (FLAGYL) IVPB 500 mg        500 mg 100 mL/hr over 60 Minutes  Intravenous Every 8 hours 10/26/12 2314     10/26/12 1900   ciprofloxacin (CIPRO) IVPB 400 mg        400 mg 200 mL/hr over 60 Minutes Intravenous  Once 10/26/12 1847 10/26/12 2148   10/26/12 1900   metroNIDAZOLE (FLAGYL) IVPB 500 mg        500 mg 100 mL/hr over 60 Minutes Intravenous  Once 10/26/12 1847 10/26/12 2041          Assessment/Plan:  Sigmoid diverticulitis with abscess. They #2 status post percutaneous drainage. Clinically improving on antibiotics and bowel rest. Leukocytosis persists.  Greenish enteric color  to drainage suggest possibility of fistula development.  Recommend continue IV antibiotics and hospitalization. Liquid diet only.  Allow prune-juice to stimulate bowel function Consider followup CT with oral contrast in the near future to assess adequacy of drainage, but not today since only two days since drain placed. Continue Tower for observation of coarctation character of drainage.   LOS: 3 days    Wadie Mattie M. Derrell Lolling, M.D., [facility] General and Minimally invasive Surgery Breast and Colorectal Surgery Office:   [phone] Pager:   [phone]  10/29/2012

## 2012-10-30 LAB — COMPREHENSIVE METABOLIC PANEL
ALT: 43 U/L — ABNORMAL HIGH (ref 0–35)
AST: 47 U/L — ABNORMAL HIGH (ref 0–37)
Albumin: 2.1 g/dL — ABNORMAL LOW (ref 3.5–5.2)
Alkaline Phosphatase: 242 U/L — ABNORMAL HIGH (ref 39–117)
BUN: 5 mg/dL — ABNORMAL LOW (ref 6–23)
CO2: 23 mEq/L (ref 19–32)
Calcium: 8 mg/dL — ABNORMAL LOW (ref 8.4–10.5)
Chloride: 102 mEq/L (ref 96–112)
Creatinine, Ser: 0.74 mg/dL (ref 0.50–1.10)
GFR calc Af Amer: >90 mL/min (ref >90)
GFR calc non Af Amer: 85 mL/min — ABNORMAL LOW (ref >90)
Glucose, Bld: 111 mg/dL — ABNORMAL HIGH (ref 70–99)
Potassium: 4.5 mEq/L (ref 3.5–5.1)
Sodium: 135 mEq/L (ref 135–145)
Total Bilirubin: 0.2 mg/dL — ABNORMAL LOW (ref 0.3–1.2)
Total Protein: 5.5 g/dL — ABNORMAL LOW (ref 6.0–8.3)

## 2012-10-30 LAB — CBC
HCT: 28.8 % — ABNORMAL LOW (ref 36.0–46.0)
Hemoglobin: 9.5 g/dL — ABNORMAL LOW (ref 12.0–15.0)
MCH: 28.7 pg (ref 26.0–34.0)
MCHC: 33 g/dL (ref 30.0–36.0)
MCV: 87 fL (ref 78.0–100.0)
Platelets: 364 10*3/uL (ref 150–400)
RBC: 3.31 MIL/uL — ABNORMAL LOW (ref 3.87–5.11)
RDW: 13.6 % (ref 11.5–15.5)
WBC: 11.4 10*3/uL — ABNORMAL HIGH (ref 4.0–10.5)

## 2012-10-30 LAB — CULTURE, ROUTINE-ABSCESS

## 2012-10-30 NOTE — Progress Notes (Signed)
PROGRESS NOTE  Jackie Stevens ZOX:[id number] DOB: 19-Jan-1946 DOA: 10/26/2012 PCP: Danise Edge, MD  Brief narrative: 66 yr old CF admitted from PCP office with a h/o intractable n/v/ diarrhea.  Of note, she went to her Ob-Gyn on 10/23 or 10/24 and received a 2 week dose of Macrobid for a presumed UTI.  She was found on CT scan on admission to have a diverticular abscess and then had a percutaneous drainage by interventional radiology 10/27/2012 which yielded frank pus. Patient did well subsequent drainage placement and hasn't followed by general surgery and interventional radiology closely. The drain has put out progressively less and less purulent material and general surgery is of the opinion patient will benefit from a repeat CT scan to ensure absence has been completely drained versus whether this communicates with intraluminal in with the colon. Patient will continue IV antibiotics until further imaging is done. Patient diet was graduated from clear liquids as tolerated on 10/29/2012.  It was noted that her output in the drain became stool color 111/18 after graduation of diet and she was back down off of regular diet back to the liquids    Past medical history-As per Problem list Chart reviewed as below-   Admission 07/18/11 with non-cardiogenic CP  See Dr. Fannie Knee for OSA-Bedtime 1030-1200 MN, latency 10 minutes, up 530- 630 AM. Diagnostic [facility]PSG[facility] 07/15/10- AHI 13.5/hr. CPAP titration at [facility] facility 08/25/10- CPAP 10> AHI 0/hr.  H/o thyroid cancer c Partial thyroidectomy RAI rx  H/o asthma  H/o abnormal cervical cytology  Consultants:  Interventional radiology  Gen surgery  Procedures:  2 view abdominal xray=non-obstructive bowel gas pattern  Acute sigmoid diverticulitis with a large complex pelvic abscess. The patient does have bladder prolapse.   Antibiotics:  Cipro 11/14>>  Flagyl 11/14 >>  Cdiff was neg   Subjective   2 loose stools today greenish  color-abdominal pain is minimal. no vomiting no chest pain at present. No abdominal pain. Has pain over where she was injected with Lovenox   Objective    Interim History: Reviewed chart Drain output increased to 95 cc of green stool  Telemetry: Normal sinus rhythm no red alarms-telemetry discontinued 10/29/2012  Objective: Filed Vitals:   10/29/12 1359 10/29/12 2126 10/30/12 0528 10/30/12 0735  BP: 139/62 134/74 150/56 136/61  Pulse: 84 78 78 79  Temp: 98 F (36.7 C) 98.4 F (36.9 C) 98.5 F (36.9 C) 98.5 F (36.9 C)  TempSrc: Oral Oral Oral Oral  Resp: 20 18 18 18   Height:      Weight:      SpO2: 100% 99% 98% 98%    Intake/Output Summary (Last 24 hours) at 10/30/12 1313 Last data filed at 10/30/12 1020  Gross per 24 hour  Intake    760 ml  Output     95 ml  Net    665 ml    Exam:  General: Pleasant alert oriented Caucasian female no apparent distress Cardiovascular: S1-S2? 3/6 early systolic murmur left lower sternal edge, mild cardiomegaly by exam-? Radiation 2 right carotid artery Respiratory: Clinically clear no added sound, no tactile vocal resonance or fremitus Abdomen: Nontender nondistended no rebound no guarding-has tenderness over sites where she was injected with Lovenox Skin no lower extremity edema Neuro motor grossly intact  Data Reviewed: Basic Metabolic Panel:  Lab 10/30/12 8119 10/29/12 0454 10/28/12 0913 10/26/12 1944 10/25/12 1157  NA 135 132* 131* 123* 127*  K 4.5 3.8 -- -- --  CL 102 99 99  87* 89*  CO2 23 24 24 24 28   GLUCOSE 111* 120* 152* 106* 104*  BUN 5* 8 12 13 19   CREATININE 0.74 0.81 0.99 0.89 1.1  CALCIUM 8.0* 7.7* 8.1* 8.2* 8.5  MG -- -- -- -- --  PHOS -- -- -- -- 3.8   Liver Function Tests:  Lab 10/30/12 0515 10/29/12 0454 10/25/12 1157  AST 47* 20 28  ALT 43* 37* 54*  ALKPHOS 242* 112 136*  BILITOT 0.2* 0.1* 0.3  PROT 5.5* 5.2* 7.3  ALBUMIN 2.1* 1.9* 3.1*3.1*   No results found for this basename:  LIPASE:5,AMYLASE:5 in the last 168 hours No results found for this basename: AMMONIA:5 in the last 168 hours CBC:  Lab 10/30/12 0515 10/29/12 0454 10/28/12 0913 10/26/12 1944 10/25/12 1157  WBC 11.4* 13.8* 14.9* 20.1* 18.5 Repeated and verified X2.*  NEUTROABS -- 10.8* -- 16.9* --  HGB 9.5* 9.1* 9.0* 10.9* 11.7*  HCT 28.8* 27.1* 27.5* 31.1* 36.6  MCV 87.0 87.7 87.9 85.0 90.8  PLT 364 318 310 372 416.0*   Cardiac Enzymes: No results found for this basename: CKTOTAL:5,CKMB:5,CKMBINDEX:5,TROPONINI:5 in the last 168 hours BNP: No components found with this basename: POCBNP:5 CBG: No results found for this basename: GLUCAP:5 in the last 168 hours  Recent Results (from the past 240 hour(s))  URINE CULTURE     Status: Normal   Collection Time   10/25/12  1:39 PM      Component Value Range Status Comment   Colony Count 60,000 COLONIES/ML   Final    Organism ID, Bacteria Multiple bacterial morphotypes present, none   Final    Organism ID, Bacteria predominant. Suggest appropriate recollection if    Final    Organism ID, Bacteria clinically indicated.   Final   CULTURE, ROUTINE-ABSCESS     Status: Normal   Collection Time   10/27/12  4:34 PM      Component Value Range Status Comment   Specimen Description ABSCESS DRAINAGE   Final    Special Requests DIVERTICULAR ABSCESS   Final    Gram Stain     Final    Value: MODERATE WBC PRESENT,BOTH PMN AND MONONUCLEAR     NO SQUAMOUS EPITHELIAL CELLS SEEN     MODERATE GRAM POSITIVE COCCI IN CLUSTERS     IN PAIRS FEW GRAM POSITIVE RODS   Culture     Final    Value: MULTIPLE ORGANISMS PRESENT, NONE PREDOMINANT     Note: NO STAPHYLOCOCCUS AUREUS ISOLATED NO GROUP A STREP (S.PYOGENES) ISOLATED   Report Status 10/30/2012 FINAL   Final   CULTURE, BLOOD (ROUTINE X 2)     Status: Normal (Preliminary result)   Collection Time   10/27/12  7:00 PM      Component Value Range Status Comment   Specimen Description BLOOD HAND LEFT   Final    Special  Requests BOTTLES DRAWN AEROBIC ONLY 10CC   Final    Culture  Setup Time 10/28/2012 01:53   Final    Culture     Final    Value:        BLOOD CULTURE RECEIVED NO GROWTH TO DATE CULTURE WILL BE HELD FOR 5 DAYS BEFORE ISSUING A FINAL NEGATIVE REPORT   Report Status PENDING   Incomplete   CULTURE, BLOOD (ROUTINE X 2)     Status: Normal (Preliminary result)   Collection Time   10/27/12  7:11 PM      Component Value Range Status Comment   Specimen Description  BLOOD ARM LEFT   Final    Special Requests BOTTLES DRAWN AEROBIC ONLY 8CC   Final    Culture  Setup Time 10/28/2012 01:53   Final    Culture     Final    Value:        BLOOD CULTURE RECEIVED NO GROWTH TO DATE CULTURE WILL BE HELD FOR 5 DAYS BEFORE ISSUING A FINAL NEGATIVE REPORT   Report Status PENDING   Incomplete   CLOSTRIDIUM DIFFICILE BY PCR     Status: Normal   Collection Time   10/29/12  2:04 PM      Component Value Range Status Comment   C difficile by pcr NEGATIVE  NEGATIVE Final      Studies:              All Imaging reviewed and is as per above notation   Scheduled Meds:    . ciprofloxacin  400 mg Intravenous BID  . heparin  5,000 Units Subcutaneous Q8H  . levothyroxine  150 mcg Oral Custom  . levothyroxine  225 mcg Oral Custom  . metronidazole  500 mg Intravenous Q8H  . potassium chloride  40 mEq Oral BID   Continuous Infusions:    . dextrose 5 % and 0.9% NaCl 100 mL/hr at 10/30/12 1135     Assessment/Plan: 1. ? Diverticulitis differential diagnoses = C. difficile colitis.  C. difficile was negative 11/17. Continue Cipro and Flagyl. She has no further abdominal pain-given stool in drain output, concerns for fistula are present. I have discussed her case in person with Dr. Ezzard Standing who will follow along-appreciate input in advance. We will defer further management to general surgery 2. ? Diverticular abscess day #3 status post percutaneous drainage via transgluteal approach-cultures show multiple  organisms 3. Elevated liver enzymes-probably secondary to sepsis? Unclear etiology-predominantly elevated Alk Phos-trend.  Would get US liver if remains elevated-concern would be potential risk for colon ca with mets vs lymphoma-might need colonoscopy per my discussion c Dr. Ezzard Standing 4. Nausea vomiting-added Phenergan 12.5 to her scheduled Zofran. 5. 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 History of obstructive sleep apnea-will place her on AutoPap-respiratory therapist to determine settings 7. History of abnormal cervical cytology-per Dr. Elijah Birk Estrace 0.1 mg  8. HLD-hold statin for now 9. Hypokalemia-probably secondary to ongoing diarrheal losses, continue potassium chloride 40 twice a day-resolving 10. Htn-given volume depletion and diarroea, hold Diovan-hct 320-25 for now-mod controlled.  Add norvasc 5 in am if still elevated 11. Mild dizziness-further discussion with patient reveals this is chronic and occasionally happens. Given her blood pressure was slightly low we will monitor this 12. Hyponatremia-probably 2/2 to diuretic and ongoing GI losses.  Follow Bmet in am-improving slowly.  Code Status: Full Family Communication: Son at bedside with patient-updated fully. Disposition Plan: Inpatient-per en surgery and Interventional radiology   Pleas Koch, MD  [facility] Pager [phone] 10/30/2012, 1:13 PM    LOS: 4 days

## 2012-10-30 NOTE — Progress Notes (Signed)
  Subjective: She feels much better, got some prune juice for her constipation and has had a change in the color of her abscess drainage.  Going from a green tea color, to yellowish brown feculent color. Her diet was advanced to Dys III.  Objective: Vital signs in last 24 hours: Temp:  [98 F (36.7 C)-98.5 F (36.9 C)] 98.5 F (36.9 C) (11/18 0735) Pulse Rate:  [78-84] 79  (11/18 0735) Resp:  [18-20] 18  (11/18 0735) BP: (134-150)/(56-74) 136/61 mmHg (11/18 0735) SpO2:  [98 %-100 %] 98 % (11/18 0735) Last BM Date: 10/26/12 600 PO recorded, dysphagia III diet, afebrile, VSS, WBC improving, CT guided cath shows 80 ml of purulent 10/27/12,   drainage, culture shows multiple organism, 10/27/12 Urine culture: multiple organicisms, 10/25/12 C diff negative by PCR 10/29/12 Intake/Output from previous day: 11/17 0701 - 11/18 0700 In: 800 [P.O.:600; I.V.:200] Out: 95 [Drains:95] Intake/Output this shift: Total I/O In: 200 [IV Piggyback:200] Out: -   General appearance: alert, cooperative and no distress GI: soft, non-tender; bowel sounds normal; no masses,  no organomegaly and drainage appears different to family and feculent to me.  Lab Results:   [facility] 10/30/12 0515 10/29/12 0454  WBC 11.4* 13.8*  HGB 9.5* 9.1*  HCT 28.8* 27.1*  PLT 364 318    BMET  Basename 10/30/12 0515 10/29/12 0454  NA 135 132*  K 4.5 3.8  CL 102 99  CO2 23 24  GLUCOSE 111* 120*  BUN 5* 8  CREATININE 0.74 0.81  CALCIUM 8.0* 7.7*   PT/INR No results found for this basename: LABPROT:2,INR:2 in the last 72 hours   Lab 10/30/12 0515 10/29/12 0454 10/25/12 1157  AST 47* 20 28  ALT 43* 37* 54*  ALKPHOS 242* 112 136*  BILITOT 0.2* 0.1* 0.3  PROT 5.5* 5.2* 7.3  ALBUMIN 2.1* 1.9* 3.1*3.1*     Lipase  No results found for this basename: lipase     Studies/Results: No results found.  Medications:    . ciprofloxacin  400 mg Intravenous BID  . heparin  5,000 Units Subcutaneous Q8H  .  levothyroxine  150 mcg Oral Custom  . levothyroxine  225 mcg Oral Custom  . metronidazole  500 mg Intravenous Q8H  . potassium chloride  40 mEq Oral BID    Assessment/Plan Sigmoid diverticulitis with abscess. They #3 status post percutaneous drainage (perc drain 10/27/2012)  HTN (hypertension)  OSA (obstructive sleep apnea GERD (gastroesophageal reflux disease) History of thyroid cancer  Hyperlipidemia Constipation/Diverticulitis  Body mass index is 37.3   Plan:  Go back to clears for now, discuss, with Dr. Lindie Spruce.  Discuss possible drain injection to look for fistula.    LOS: 4 days   JENNINGS,WILLARD 10/30/2012  Husband (morbidly obese in wheelchair) and son in room.  Feels better.  Main discomfort is left buttocks, at drain site.  She is doing a good job of walking.  Ovidio Kin, MD, [facility] Pager: [phone] Office phone:  [phone]

## 2012-10-30 NOTE — ED Provider Notes (Signed)
I saw and evaluated the patient, reviewed the resident's note and I agree with the findings and plan.  CT scan shows diverticulitis with pelvic abscess.  Discussed with surgery.  Pt will be admitted by medicine.  Drainage to be accomplished with IR intervention.  Celene Kras, MD 10/30/12 [phone]

## 2012-10-30 NOTE — Progress Notes (Signed)
Subjective: Pt feeling better. On soft diet. Had loose BM, no increased pain.  Objective: Physical Exam: BP 150/56  Pulse 78  Temp 98.5 F (36.9 C) (Oral)  Resp 18  Ht 5\' 2"  (1.575 m)  Wt 204 lb (92.534 kg)  BMI 37.31 kg/m2  SpO2 98% (L)gluteal drain intact, site clean. NT Output recorded 95cc yesterday, feculent appearing.   Labs: CBC  Basename 10/30/12 0515 10/29/12 0454  WBC 11.4* 13.8*  HGB 9.5* 9.1*  HCT 28.8* 27.1*  PLT 364 318   BMET  Basename 10/30/12 0515 10/29/12 0454  NA 135 132*  K 4.5 3.8  CL 102 99  CO2 23 24  GLUCOSE 111* 120*  BUN 5* 8  CREATININE 0.74 0.81  CALCIUM 8.0* 7.7*   LFT  Basename 10/30/12 0515  PROT 5.5*  ALBUMIN 2.1*  AST 47*  ALT 43*  ALKPHOS 242*  BILITOT 0.2*  BILIDIR --  IBILI --  LIPASE --   PT/INR No results found for this basename: LABPROT:2,INR:2 in the last 72 hours   Studies/Results: No results found.  Assessment/Plan: Presumed diverticular abscess s/p (L)TG percutaneous drainage 11/15. Feculent output concerning for fistula WBC trending down Will cont to follow.   LOS: 4 days    Brayton El PA-C 10/30/2012 9:35 AM

## 2012-10-31 DIAGNOSIS — I10 Essential (primary) hypertension: Secondary | ICD-10-CM

## 2012-10-31 DIAGNOSIS — G4733 Obstructive sleep apnea (adult) (pediatric): Secondary | ICD-10-CM

## 2012-10-31 MED ORDER — ACETAMINOPHEN 325 MG PO TABS: 650.0000 mg | ORAL_TABLET | Freq: Four times a day (QID) | ORAL | Status: DC | PRN

## 2012-10-31 MED ORDER — ONDANSETRON HCL 4 MG/2ML IJ SOLN
4.0000 mg | Freq: Four times a day (QID) | INTRAMUSCULAR | Status: DC | PRN
  Administered 2012-11-01 – 2012-11-03 (×4): 4 mg via INTRAVENOUS
  Filled 2012-10-31 (×3): qty 2

## 2012-10-31 MED ORDER — MORPHINE SULFATE 2 MG/ML IJ SOLN: 1.0000 mg | INTRAMUSCULAR | Status: DC | PRN

## 2012-10-31 MED ORDER — MAGIC MOUTHWASH W/LIDOCAINE
5.0000 mL | Freq: Two times a day (BID) | ORAL | Status: DC
  Administered 2012-10-31 – 2012-11-03 (×6): 5 mL via ORAL
  Filled 2012-10-31 (×10): qty 5

## 2012-10-31 MED ORDER — OXYCODONE HCL 5 MG PO TABS
5.0000 mg | ORAL_TABLET | ORAL | Status: DC | PRN
  Administered 2012-11-01 – 2012-11-03 (×7): 10 mg via ORAL
  Filled 2012-10-31 (×7): qty 2

## 2012-10-31 NOTE — Progress Notes (Signed)
RT Note: Pt has home unit and will place CPAP on when ready for bed.

## 2012-10-31 NOTE — Progress Notes (Signed)
Subjective: Pt c/o intermittent nausea/diarrhea; c diff neg  Objective: Vital signs in last 24 hours: Temp:  [98.1 F (36.7 C)-98.7 F (37.1 C)] 98.3 F (36.8 C) (11/19 0525) Pulse Rate:  [75] 75  (11/19 0525) Resp:  [18-20] 18  (11/19 0525) BP: (139-142)/(53-70) 139/53 mmHg (11/19 0525) SpO2:  [96 %-100 %] 97 % (11/19 0525) Last BM Date: 10/30/12  Intake/Output from previous day: 11/18 0701 - 11/19 0700 In: 2766.7 [P.O.:240; I.V.:2326.7; IV Piggyback:200] Out: 85 [Drains:85] Intake/Output this shift:    Left TG drain intact, output 85 cc's liquid stool, insertion site ok, mildly tender, cx's- mult organisms  Lab Results:   Basename 10/30/12 0515 10/29/12 0454  WBC 11.4* 13.8*  HGB 9.5* 9.1*  HCT 28.8* 27.1*  PLT 364 318   BMET  Basename 10/30/12 0515 10/29/12 0454  NA 135 132*  K 4.5 3.8  CL 102 99  CO2 23 24  GLUCOSE 111* 120*  BUN 5* 8  CREATININE 0.74 0.81  CALCIUM 8.0* 7.7*   PT/INR No results found for this basename: LABPROT:2,INR:2 in the last 72 hours ABG No results found for this basename: PHART:2,PCO2:2,PO2:2,HCO3:2 in the last 72 hours Results for orders placed during the hospital encounter of 10/26/12  CULTURE, ROUTINE-ABSCESS     Status: Normal   Collection Time   10/27/12  4:34 PM      Component Value Range Status Comment   Specimen Description ABSCESS DRAINAGE   Final    Special Requests DIVERTICULAR ABSCESS   Final    Gram Stain     Final    Value: MODERATE WBC PRESENT,BOTH PMN AND MONONUCLEAR     NO SQUAMOUS EPITHELIAL CELLS SEEN     MODERATE GRAM POSITIVE COCCI IN CLUSTERS     IN PAIRS FEW GRAM POSITIVE RODS   Culture     Final    Value: MULTIPLE ORGANISMS PRESENT, NONE PREDOMINANT     Note: NO STAPHYLOCOCCUS AUREUS ISOLATED NO GROUP A STREP (S.PYOGENES) ISOLATED   Report Status 10/30/2012 FINAL   Final   CULTURE, BLOOD (ROUTINE X 2)     Status: Normal (Preliminary result)   Collection Time   10/27/12  7:00 PM      Component  Value Range Status Comment   Specimen Description BLOOD HAND LEFT   Final    Special Requests BOTTLES DRAWN AEROBIC ONLY 10CC   Final    Culture  Setup Time 10/28/2012 01:53   Final    Culture     Final    Value:        BLOOD CULTURE RECEIVED NO GROWTH TO DATE CULTURE WILL BE HELD FOR 5 DAYS BEFORE ISSUING A FINAL NEGATIVE REPORT   Report Status PENDING   Incomplete   CULTURE, BLOOD (ROUTINE X 2)     Status: Normal (Preliminary result)   Collection Time   10/27/12  7:11 PM      Component Value Range Status Comment   Specimen Description BLOOD ARM LEFT   Final    Special Requests BOTTLES DRAWN AEROBIC ONLY 8CC   Final    Culture  Setup Time 10/28/2012 01:53   Final    Culture     Final    Value:        BLOOD CULTURE RECEIVED NO GROWTH TO DATE CULTURE WILL BE HELD FOR 5 DAYS BEFORE ISSUING A FINAL NEGATIVE REPORT   Report Status PENDING   Incomplete   CLOSTRIDIUM DIFFICILE BY PCR     Status: Normal  Collection Time   10/29/12  2:04 PM      Component Value Range Status Comment   C difficile by pcr NEGATIVE  NEGATIVE Final     Studies/Results: No results found.  Anti-infectives: Anti-infectives     Start     Dose/Rate Route Frequency Ordered Stop   10/28/12 0800   ciprofloxacin (CIPRO) IVPB 400 mg        400 mg 200 mL/hr over 60 Minutes Intravenous 2 times daily 10/28/12 0724     10/27/12 1000   ciprofloxacin (CIPRO) IVPB 400 mg  Status:  Discontinued        400 mg 200 mL/hr over 60 Minutes Intravenous Every 12 hours 10/26/12 2314 10/27/12 1359   10/27/12 0600   metroNIDAZOLE (FLAGYL) IVPB 500 mg        500 mg 100 mL/hr over 60 Minutes Intravenous Every 8 hours 10/26/12 2314     10/26/12 1900   ciprofloxacin (CIPRO) IVPB 400 mg        400 mg 200 mL/hr over 60 Minutes Intravenous  Once 10/26/12 1847 10/26/12 2148   10/26/12 1900   metroNIDAZOLE (FLAGYL) IVPB 500 mg        500 mg 100 mL/hr over 60 Minutes Intravenous  Once 10/26/12 1847 10/26/12 2041           Assessment/Plan: s/p pelvic/diverticular abscess drainage 11/5; cont current tx; check f/u CT on 11/22 with 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  LOS: 5 days    Jackie Stevens,D [facility] 10/31/2012

## 2012-10-31 NOTE — Progress Notes (Signed)
Patient has the equivalent of liquid stool coming out of percutaneous drain.  Will need to discuss surgical options.  Marta Lamas. Gae Bon, MD, FACS [phone] [facility]

## 2012-10-31 NOTE — Progress Notes (Signed)
[facility] [place] TEAM 7   Jackie Stevens LKG:[id number] DOB: Apr 22, 1946 DOA: 10/26/2012 PCP: Danise Edge, MD  Brief narrative: 66 yr old F admitted from PCP office with a h/o intractable n/v/ diarrhea.  Of note, she went to her Ob-Gyn on 10/23 or 10/24 and received a 2 week dose of Macrobid for a presumed UTI.  She was found on CT scan on admission to have a diverticular abscess and then had a percutaneous drainage by interventional radiology 10/27/2012 which yielded frank pus.  Patient did well subsequent drainage placement and has been followed by general surgery and interventional radiology closely. The drain has put out progressively less and less purulent material and general surgery is of the opinion patient will benefit from a repeat CT scan to ensure absence has been completely drained versus whether this communicates with intraluminal in with the colon. Patient will continue IV antibiotics until further imaging is done. Patient diet was graduated from clear liquids as tolerated on 10/29/2012.  It was noted that her output in the drain became stool color 111/18 after graduation of diet and she was back down off of regular diet back to the liquids  Chart reviewed:  Admission 07/18/11 with non-cardiogenic CP  Sees Dr. Fannie Knee for OSA-Bedtime 1030-1200 MN, latency 10 minutes, up 530- 630 AM. Diagnostic [facility]PSGat [facility] 07/15/10- AHI 13.5/hr. CPAP titration at [facility] facility 08/25/10- CPAP 10> AHI 0/hr.  H/o thyroid cancer c Partial thyroidectomy RAI rx  H/o asthma  H/o abnormal cervical cytology  Assessment/Plan:  Sigmoid diverticulits w/ abcess status post percutaneous drainage via transgluteal approach - continue Cipro and Flagyl - given stool in drain output, concerns for fistula are present - defer further management to General Surgery - IR plans to f/u with CT and drain injection 11/22  Persistent diarrhea Due to above - Gen Surg is managing diet  Elevated liver  enzymes Unclear etiology - evaluate liver parenchyma at time of CT scan on 11/22  History hypothyroidism secondary to radio ablation for thyroid cancer continue customized dosages of levothyroxine  History of abnormal cervical cytology per Dr. Renaldo Fiddler - hold Estrace 0.1 mg   HLD hold statin for now due to transaminitis  Hypokalemia probably secondary to ongoing diarrheal losses - now much improved - slow replacement and follow  HTN Reasonably controlled at present - follow w/o change today   Hyponatremia probably 2/2 to diuretic and ongoing GI losses - resolved  OSA (obstructive sleep apnea) Cont QHS CPAP  Code Status: Full Disposition Plan: Inpatient  Consultants:  Interventional radiology  Gen Surgery  Antibiotics:  Cipro 11/14>>  Flagyl 11/14 >>   Subjective  Continues to report diarrhea, but dose feel that stool consistency is less liquid today.  Denies cp, sob, f/c.  Nausea persists, and is not responding to her current meds.   Objective    Objective: Filed Vitals:   10/30/12 1325 10/30/12 2153 10/31/12 0525 10/31/12 1400  BP: 142/70 139/54 139/53 143/64  Pulse: 75 75 75 70  Temp: 98.7 F (37.1 C) 98.1 F (36.7 C) 98.3 F (36.8 C) 98.7 F (37.1 C)  TempSrc: Oral     Resp: 20 20 18 18   Height:      Weight:      SpO2: 96% 100% 97% 100%    Intake/Output Summary (Last 24 hours) at 10/31/12 1533 Last data filed at 10/31/12 1408  Gross per 24 hour  Intake 2926.66 ml  Output     85 ml  Net 2841.66  ml    Exam:  General: No acute respiratory distress Lungs: Clear to auscultation bilaterally without wheezes or crackles Cardiovascular: Regular rate and rhythm without murmur gallop or rub normal S1 and S2 Abdomen: obese, soft, bs hyperactive, no mass, no rebound Extremities: 1+ B LE edema - no cyanosis or clubbing noted   Data Reviewed: Basic Metabolic Panel:  Lab 10/30/12 1610 10/29/12 0454 10/28/12 0913 10/26/12 1944 10/25/12 1157  NA 135  132* 131* 123* 127*  K 4.5 3.8 -- -- --  CL 102 99 99 87* 89*  CO2 23 24 24 24 28   GLUCOSE 111* 120* 152* 106* 104*  BUN 5* 8 12 13 19   CREATININE 0.74 0.81 0.99 0.89 1.1  CALCIUM 8.0* 7.7* 8.1* 8.2* 8.5  MG -- -- -- -- --  PHOS -- -- -- -- 3.8   Liver Function Tests:  Lab 10/30/12 0515 10/29/12 0454 10/25/12 1157  AST 47* 20 28  ALT 43* 37* 54*  ALKPHOS 242* 112 136*  BILITOT 0.2* 0.1* 0.3  PROT 5.5* 5.2* 7.3  ALBUMIN 2.1* 1.9* 3.1*3.1*   CBC:  Lab 10/30/12 0515 10/29/12 0454 10/28/12 0913 10/26/12 1944 10/25/12 1157  WBC 11.4* 13.8* 14.9* 20.1* 18.5 Repeated and verified X2.*  NEUTROABS -- 10.8* -- 16.9* --  HGB 9.5* 9.1* 9.0* 10.9* 11.7*  HCT 28.8* 27.1* 27.5* 31.1* 36.6  MCV 87.0 87.7 87.9 85.0 90.8  PLT 364 318 310 372 416.0*    Recent Results (from the past 240 hour(s))  URINE CULTURE     Status: Normal   Collection Time   10/25/12  1:39 PM      Component Value Range Status Comment   Colony Count 60,000 COLONIES/ML   Final    Organism ID, Bacteria Multiple bacterial morphotypes present, none   Final    Organism ID, Bacteria predominant. Suggest appropriate recollection if    Final    Organism ID, Bacteria clinically indicated.   Final   CULTURE, ROUTINE-ABSCESS     Status: Normal   Collection Time   10/27/12  4:34 PM      Component Value Range Status Comment   Specimen Description ABSCESS DRAINAGE   Final    Special Requests DIVERTICULAR ABSCESS   Final    Gram Stain     Final    Value: MODERATE WBC PRESENT,BOTH PMN AND MONONUCLEAR     NO SQUAMOUS EPITHELIAL CELLS SEEN     MODERATE GRAM POSITIVE COCCI IN CLUSTERS     IN PAIRS FEW GRAM POSITIVE RODS   Culture     Final    Value: MULTIPLE ORGANISMS PRESENT, NONE PREDOMINANT     Note: NO STAPHYLOCOCCUS AUREUS ISOLATED NO GROUP A STREP (S.PYOGENES) ISOLATED   Report Status 10/30/2012 FINAL   Final   CULTURE, BLOOD (ROUTINE X 2)     Status: Normal (Preliminary result)   Collection Time   10/27/12  7:00 PM        Component Value Range Status Comment   Specimen Description BLOOD HAND LEFT   Final    Special Requests BOTTLES DRAWN AEROBIC ONLY 10CC   Final    Culture  Setup Time 10/28/2012 01:53   Final    Culture     Final    Value:        BLOOD CULTURE RECEIVED NO GROWTH TO DATE CULTURE WILL BE HELD FOR 5 DAYS BEFORE ISSUING A FINAL NEGATIVE REPORT   Report Status PENDING   Incomplete   CULTURE, BLOOD (ROUTINE  X 2)     Status: Normal (Preliminary result)   Collection Time   10/27/12  7:11 PM      Component Value Range Status Comment   Specimen Description BLOOD ARM LEFT   Final    Special Requests BOTTLES DRAWN AEROBIC ONLY 8CC   Final    Culture  Setup Time 10/28/2012 01:53   Final    Culture     Final    Value:        BLOOD CULTURE RECEIVED NO GROWTH TO DATE CULTURE WILL BE HELD FOR 5 DAYS BEFORE ISSUING A FINAL NEGATIVE REPORT   Report Status PENDING   Incomplete   CLOSTRIDIUM DIFFICILE BY PCR     Status: Normal   Collection Time   10/29/12  2:04 PM      Component Value Range Status Comment   C difficile by pcr NEGATIVE  NEGATIVE Final      Studies:              Reviewed by MD  Scheduled Meds: Reviewed by MD   Lonia Blood, MD [facility] Office  [phone] Pager [phone]  On-Call/Text Page:      [email or web address]      password [facility]  10/31/2012, 3:33 PM    LOS: 5 days

## 2012-10-31 NOTE — Progress Notes (Signed)
  Subjective: Her biggest complaint is multiple loose stools kept her up all last night.  Objective: Vital signs in last 24 hours: Temp:  [98.1 F (36.7 C)-98.7 F (37.1 C)] 98.3 F (36.8 C) (11/19 0525) Pulse Rate:  [75] 75  (11/19 0525) Resp:  [18-20] 18  (11/19 0525) BP: (139-142)/(53-70) 139/53 mmHg (11/19 0525) SpO2:  [96 %-100 %] 97 % (11/19 0525) Last BM Date: 10/30/12  240 PO recorded, 85 ml from drain, +BM, afebrile, VSS, Alk phos, ast, and alt up some, WBC slowly coming down. C diff was negative on 10/29/12.  Intake/Output from previous day: 11/18 0701 - 11/19 0700 In: 2766.7 [P.O.:240; I.V.:2326.7; IV Piggyback:200] Out: 85 [Drains:85] Intake/Output this shift:    General appearance: alert, cooperative and no distress Resp: clear to auscultation bilaterally GI: soft, non-tender; bowel sounds normal; no masses,  no organomegaly and drain still appears feculent.  Lab Results:   [facility] 10/30/12 0515 10/29/12 0454  WBC 11.4* 13.8*  HGB 9.5* 9.1*  HCT 28.8* 27.1*  PLT 364 318    BMET  Basename 10/30/12 0515 10/29/12 0454  NA 135 132*  K 4.5 3.8  CL 102 99  CO2 23 24  GLUCOSE 111* 120*  BUN 5* 8  CREATININE 0.74 0.81  CALCIUM 8.0* 7.7*   PT/INR No results found for this basename: LABPROT:2,INR:2 in the last 72 hours   Lab 10/30/12 0515 10/29/12 0454 10/25/12 1157  AST 47* 20 28  ALT 43* 37* 54*  ALKPHOS 242* 112 136*  BILITOT 0.2* 0.1* 0.3  PROT 5.5* 5.2* 7.3  ALBUMIN 2.1* 1.9* 3.1*3.1*     Lipase  No results found for this basename: lipase     Studies/Results: No results found.  Medications:    . ciprofloxacin  400 mg Intravenous BID  . heparin  5,000 Units Subcutaneous Q8H  . levothyroxine  150 mcg Oral Custom  . levothyroxine  225 mcg Oral Custom  . metronidazole  500 mg Intravenous Q8H  . potassium chloride  40 mEq Oral BID    Assessment/Plan Sigmoid diverticulitis with abscess. They #3 status post percutaneous drainage  (perc drain 10/27/2012)  HTN (hypertension)  OSA (obstructive sleep apnea  GERD (gastroesophageal reflux disease)  History of thyroid cancer  Hyperlipidemia  Constipation/Diverticulitis  Body mass index is 37.3   We put her back on low fiber diet yesterday, will see what next CT shows.  IR drain place 11/15 so I will check when to repeat CT.   LOS: 5 days    Hershal Eriksson 10/31/2012

## 2012-11-01 ENCOUNTER — Ambulatory Visit: Payer: Medicare Other | Admitting: Family Medicine

## 2012-11-01 DIAGNOSIS — R87619 Unspecified abnormal cytological findings in specimens from cervix uteri: Secondary | ICD-10-CM

## 2012-11-01 MED ORDER — BOOST / RESOURCE BREEZE PO LIQD
1.0000 | Freq: Two times a day (BID) | ORAL | Status: DC
  Administered 2012-11-01 – 2012-11-03 (×3): 1 via ORAL

## 2012-11-01 NOTE — Progress Notes (Signed)
[facility] PROGRESS NOTE  Jackie Stevens AVW:[id number] DOB: [birth year] DOA: 10/26/2012 PCP: Danise Edge, MD  Assessment/Plan: Sigmoid diverticulits w/ abcess  status post percutaneous drainage via transgluteal approach - continue Cipro and Flagyl - given stool in drain output, concerns for fistula are present - defer further management to General Surgery - IR plans to f/u with CT and drain injection 11/22   Persistent diarrhea  Due to above - Gen Surg is managing diet , currently on dysphagia 3 with thin liquids Elevated liver enzymes  Unclear etiology - evaluate liver parenchyma at time of CT scan on 11/22  History hypothyroidism secondary to radio ablation for thyroid cancer  continue customized dosages of levothyroxine  History of abnormal cervical cytology  per Dr. Renaldo Fiddler - hold Estrace 0.1 mg  HLD  hold statin for now due to transaminitis  Hypokalemia  probably secondary to ongoing diarrheal losses - now much improved - slow replacement and follow . Potassium level today at 4.5 HTN  Reasonably controlled at present - follow w/o change today  Hyponatremia  probably 2/2 to diuretic and ongoing GI losses - resolved  OSA (obstructive sleep apnea)  Cont QHS CPAP   Code Status: Full code Family Communication: Discussed with patient at bedside Disposition Plan: Possibly home, but patient will be here for at least the next few days   Consultants:  Interventional radiology  General surgery  Procedures:  Percutaneous drainage of diverticular abscess done 11/15  Antibiotics:  IV Cipro day 7 started 11/14  IV Flagyl day 7 started 11/14  HPI/Subjective: 66 yr old F admitted from PCP office with a h/o intractable n/v/ diarrhea. Of note, she went to her Ob-Gyn on 10/23 or 10/24 and received a 2 week dose of Macrobid for a presumed UTI. She was found on CT scan on admission to have a diverticular abscess and then had a percutaneous drainage by interventional  radiology 10/27/2012 which yielded frank pus. Patient did well subsequent drainage placement and has been followed by general surgery and interventional radiology closely. The drain has put out progressively less and less purulent material and general surgery is of the opinion patient will benefit from a repeat CT scan to ensure absence has been completely drained versus whether this communicates with intraluminal in with the colon.  Patient will continue IV antibiotics until further imaging is done.  Patient diet was graduated from clear liquids as tolerated on 10/29/2012.  It was noted that her output in the drain became stool color 111/18 after graduation of diet and she was back down off of regular diet back to the liquids   Objective: Filed Vitals:   10/31/12 1400 10/31/12 2140 11/01/12 0600 11/01/12 1433  BP: 143/64 153/63 146/67 154/63  Pulse: 70 77 69 78  Temp: 98.7 F (37.1 C) 98.4 F (36.9 C) 97.6 F (36.4 C) 98.7 F (37.1 C)  TempSrc:  Oral Oral Oral  Resp: 18 18 20 20   Height:      Weight:      SpO2: 100% 98% 94% 100%    Intake/Output Summary (Last 24 hours) at 11/01/12 1543 Last data filed at 11/01/12 1300  Gross per 24 hour  Intake 938.33 ml  Output     46 ml  Net 892.33 ml   Filed Weights   10/26/12 2313  Weight: 92.534 kg (204 lb)    Exam:   General:  Alert and oriented x3, no acute distress, fatigued, looks younger than stated age  Cardiovascular: Regular rate and  rhythm, S1-S2  Respiratory: Clear to auscultation bilaterally  Abdomen: Soft, obese, hypoactive bowel sounds, nontender  Extremities: 1+ pitting edema no clubbing or cyanosis  Data Reviewed: Basic Metabolic Panel:  Lab 10/30/12 1610 10/29/12 0454 10/28/12 0913 10/26/12 1944  NA 135 132* 131* 123*  K 4.5 3.8 3.1* 3.6  CL 102 99 99 87*  CO2 23 24 24 24   GLUCOSE 111* 120* 152* 106*  BUN 5* 8 12 13   CREATININE 0.74 0.81 0.99 0.89  CALCIUM 8.0* 7.7* 8.1* 8.2*  MG -- -- -- --  PHOS --  -- -- --   Liver Function Tests:  Lab 10/30/12 0515 10/29/12 0454  AST 47* 20  ALT 43* 37*  ALKPHOS 242* 112  BILITOT 0.2* 0.1*  PROT 5.5* 5.2*  ALBUMIN 2.1* 1.9*   CBC:  Lab 10/30/12 0515 10/29/12 0454 10/28/12 0913 10/26/12 1944  WBC 11.4* 13.8* 14.9* 20.1*  NEUTROABS -- 10.8* -- 16.9*  HGB 9.5* 9.1* 9.0* 10.9*  HCT 28.8* 27.1* 27.5* 31.1*  MCV 87.0 87.7 87.9 85.0  PLT 364 318 310 372      Collection Time   10/27/12  4:34 PM      Component Value Range Status Comment   Specimen Description ABSCESS DRAINAGE   Final    Special Requests DIVERTICULAR ABSCESS   Final    Gram Stain     Final    Value: MODERATE WBC PRESENT,BOTH PMN AND MONONUCLEAR     NO SQUAMOUS EPITHELIAL CELLS SEEN     MODERATE GRAM POSITIVE COCCI IN CLUSTERS     IN PAIRS FEW GRAM POSITIVE RODS   Culture     Final    Value: MULTIPLE ORGANISMS PRESENT, NONE PREDOMINANT     Note: NO STAPHYLOCOCCUS AUREUS ISOLATED NO GROUP A STREP (S.PYOGENES) ISOLATED   Report Status 10/30/2012 FINAL   Final          CLOSTRIDIUM DIFFICILE BY PCR     Status: Normal   Collection Time   10/29/12  2:04 PM      Component Value Range Status Comment   C difficile by pcr NEGATIVE  NEGATIVE Final      Studies: No results found.  Scheduled Meds:   . ciprofloxacin  400 mg Intravenous BID  . feeding supplement  1 Container Oral BID BM  . heparin  5,000 Units Subcutaneous Q8H  . levothyroxine  150 mcg Oral Custom  . levothyroxine  225 mcg Oral Custom  . magic mouthwash w/lidocaine  5 mL Oral BID  . metronidazole  500 mg Intravenous Q8H  . potassium chloride  40 mEq Oral BID   Continuous Infusions:   . dextrose 5 % and 0.9% NaCl 50 mL/hr at 11/01/12 0303    Active Problems:  OBSTRUCTIVE SLEEP APNEA  HYPERTENSION  G E R D  Thyroid disease  Constipation  Diverticulitis    Time spent: 25 min    Hollice Espy  [facility] Pager [phone]. If 8PM-8AM, please contact night-coverage at [email or web address],  password [facility] 11/01/2012, 3:43 PM  LOS: 6 days

## 2012-11-01 NOTE — Progress Notes (Signed)
Will try to make transition of patient to oral antibiotics and discharge in a few days.  However it seems as though the patient may not be progressing as desired.  Possibility that she could have surgery by Friday, but this may require a colostomy.  Marta Lamas. Gae Bon, MD, FACS [phone] [facility]

## 2012-11-01 NOTE — Progress Notes (Signed)
Nutrition Follow-up  Intervention:   1. Recommend Regular diet to maximize food choices as mechanical soft foods are unappealing to pt 2. Resource Breeze po BID, each supplement provides 250 kcal and 9 grams of protein. 3. Reviewed food choices and menu items with patient 4. RD to continue to follow nutrition careplan  Assessment:   Underwent percutaneous drainage of diverticular abscess via left transgluteal on 11/15. Pt is having nausea with Dysphagia 3 diet. Consuming 50% of meals. Pt states that nothing sounds appealing to her. Discussed different food choices and bland items.   Pt agreeable to trying [employer].   Diet Order:  Dysphagia 3 with thin liquids  Meds: Scheduled Meds:   . ciprofloxacin  400 mg Intravenous BID  . heparin  5,000 Units Subcutaneous Q8H  . levothyroxine  150 mcg Oral Custom  . levothyroxine  225 mcg Oral Custom  . magic mouthwash w/lidocaine  5 mL Oral BID  . metronidazole  500 mg Intravenous Q8H  . potassium chloride  40 mEq Oral BID   Continuous Infusions:   . dextrose 5 % and 0.9% NaCl 50 mL/hr at 11/01/12 0303   PRN Meds:.acetaminophen, morphine injection, nitroGLYCERIN, ondansetron (ZOFRAN) IV, oxyCODONE, promethazine, [DISCONTINUED] acetaminophen-codeine, [DISCONTINUED] acetaminophen-codeine, [DISCONTINUED] codeine   CMP     Component Value Date/Time   NA 135 10/30/2012 0515   K 4.5 10/30/2012 0515   CL 102 10/30/2012 0515   CO2 23 10/30/2012 0515   GLUCOSE 111* 10/30/2012 0515   BUN 5* 10/30/2012 0515   CREATININE 0.74 10/30/2012 0515   CALCIUM 8.0* 10/30/2012 0515   PROT 5.5* 10/30/2012 0515   ALBUMIN 2.1* 10/30/2012 0515   AST 47* 10/30/2012 0515   ALT 43* 10/30/2012 0515   ALKPHOS 242* 10/30/2012 0515   BILITOT 0.2* 10/30/2012 0515   GFRNONAA 85* 10/30/2012 0515   GFRAA >90 10/30/2012 0515   CBG (last 3)  No results found for this basename: GLUCAP:3 in the last 72 hours   Intake/Output Summary (Last 24 hours) at  11/01/12 1231 Last data filed at 11/01/12 0830  Gross per 24 hour  Intake 1738.33 ml  Output     45 ml  Net 1693.33 ml  BM: 11/20  Weight Status:  204 lb (11/14)  Estimated needs:  1700 - 1900 kcal, 90 - 100 grams protein  New Nutrition Dx:  Inadequate oral intake now r/t nausea AEB pt report.  Goal:  Pt to meet >/= 90% of their estimated nutrition needs. Unmet  Monitor:  Nausea, PO intake, diet advancement, supplement tolerance  Jarold Motto MS, RD, LDN Pager: [phone] After-hours pager: [phone]

## 2012-11-01 NOTE — Progress Notes (Signed)
Subjective: Nauseous this am. Still having loose BMs. Sore at drain site when sitting.  Objective: Physical Exam: BP 146/67  Pulse 69  Temp 97.6 F (36.4 C) (Oral)  Resp 20  Ht 5\' 2"  (1.575 m)  Wt 204 lb (92.534 kg)  BMI 37.31 kg/m2  SpO2 94% (L)TG drain intact, site clean, NT Output still feculent appearing, 45cc recorded yesterday   Labs: CBC  Basename 10/30/12 0515  WBC 11.4*  HGB 9.5*  HCT 28.8*  PLT 364   BMET  Basename 10/30/12 0515  NA 135  K 4.5  CL 102  CO2 23  GLUCOSE 111*  BUN 5*  CREATININE 0.74  CALCIUM 8.0*   LFT  Basename 10/30/12 0515  PROT 5.5*  ALBUMIN 2.1*  AST 47*  ALT 43*  ALKPHOS 242*  BILITOT 0.2*  BILIDIR --  IBILI --  LIPASE --   PT/INR No results found for this basename: LABPROT:2,INR:2 in the last 72 hours   Studies/Results: No results found.  Assessment/Plan: Presumed diverticular abscess s/p (L)TG percutaneous drainage 11/15.  Feculent output concerning for fistula  WBC trending down  Will cont to follow.   LOS: 6 days    Brayton El PA-C 11/01/2012 11:07 AM

## 2012-11-01 NOTE — Progress Notes (Signed)
  Subjective: She is a little more anxious and worried today.  She is having more nausea, and even smell is making her sick now.  She is also a little more tender this AM mostly LLQ.  Wondering if she needs an operation after CT, if she will have to wait until next week.  Objective: Vital signs in last 24 hours: Temp:  [97.6 F (36.4 C)-98.7 F (37.1 C)] 97.6 F (36.4 C) (11/20 0600) Pulse Rate:  [69-77] 69  (11/20 0600) Resp:  [18-20] 20  (11/20 0600) BP: (143-153)/(63-67) 146/67 mmHg (11/20 0600) SpO2:  [94 %-100 %] 94 % (11/20 0600) Last BM Date: 10/30/12  600 PO recorded, 1 stool recorded, 45 ml thru drain, afebrile, VSS, No labs today.  Intake/Output from previous day: 11/19 0701 - 11/20 0700 In: 1858.3 [P.O.:600; I.V.:1258.3] Out: 45 [Drains:45] Intake/Output this shift:    General appearance: alert, cooperative, no distress and worried Resp: clear to auscultation bilaterally GI: soft, some tenderness in the LLQ today, no change in drainage.  Soft, + BS, having allot of loose stools.  Lab Results:   [facility] 10/30/12 0515  WBC 11.4*  HGB 9.5*  HCT 28.8*  PLT 364    BMET  Basename 10/30/12 0515  NA 135  K 4.5  CL 102  CO2 23  GLUCOSE 111*  BUN 5*  CREATININE 0.74  CALCIUM 8.0*   PT/INR No results found for this basename: LABPROT:2,INR:2 in the last 72 hours   Lab 10/30/12 0515 10/29/12 0454 10/25/12 1157  AST 47* 20 28  ALT 43* 37* 54*  ALKPHOS 242* 112 136*  BILITOT 0.2* 0.1* 0.3  PROT 5.5* 5.2* 7.3  ALBUMIN 2.1* 1.9* 3.1*3.1*     Lipase  No results found for this basename: lipase     Studies/Results: No results found.  Medications:    . ciprofloxacin  400 mg Intravenous BID  . heparin  5,000 Units Subcutaneous Q8H  . levothyroxine  150 mcg Oral Custom  . levothyroxine  225 mcg Oral Custom  . magic mouthwash w/lidocaine  5 mL Oral BID  . metronidazole  500 mg Intravenous Q8H  . potassium chloride  40 mEq Oral BID     Assessment/Plan Sigmoid diverticulitis with abscess. They #3 status post percutaneous drainage (perc drain 10/27/2012)  HTN (hypertension)  OSA (obstructive sleep apnea  GERD (gastroesophageal reflux disease)  History of thyroid cancer  Hyperlipidemia  Constipation/Diverticulitis  Body mass index is 37.3   Plan:  I have left her on her current diet, but ask her to try liquids if the smell of the DIII diet makes her nauseated.   Tentatively planning repeat CT Friday 11/22, with drain injection.  Will see if we need to do it tomorrow..    LOS: 6 days    Hue Frick 11/01/2012

## 2012-11-02 DIAGNOSIS — K219 Gastro-esophageal reflux disease without esophagitis: Secondary | ICD-10-CM

## 2012-11-02 LAB — BASIC METABOLIC PANEL
BUN: 4 mg/dL — ABNORMAL LOW (ref 6–23)
CO2: 26 mEq/L (ref 19–32)
Calcium: 8.1 mg/dL — ABNORMAL LOW (ref 8.4–10.5)
Chloride: 101 mEq/L (ref 96–112)
Creatinine, Ser: 0.8 mg/dL (ref 0.50–1.10)
GFR calc Af Amer: 86 mL/min — ABNORMAL LOW (ref >90)
GFR calc non Af Amer: 74 mL/min — ABNORMAL LOW (ref >90)
Glucose, Bld: 104 mg/dL — ABNORMAL HIGH (ref 70–99)
Potassium: 5 mEq/L (ref 3.5–5.1)
Sodium: 136 mEq/L (ref 135–145)

## 2012-11-02 LAB — CBC
HCT: 31 % — ABNORMAL LOW (ref 36.0–46.0)
Hemoglobin: 10.2 g/dL — ABNORMAL LOW (ref 12.0–15.0)
MCH: 29.7 pg (ref 26.0–34.0)
MCHC: 32.9 g/dL (ref 30.0–36.0)
MCV: 90.1 fL (ref 78.0–100.0)
Platelets: 406 10*3/uL — ABNORMAL HIGH (ref 150–400)
RBC: 3.44 MIL/uL — ABNORMAL LOW (ref 3.87–5.11)
RDW: 14.3 % (ref 11.5–15.5)
WBC: 9 10*3/uL (ref 4.0–10.5)

## 2012-11-02 NOTE — Progress Notes (Signed)
[facility] Progress Note    Subjective: Patient a bit nauseous this morning after her walk.  No vomiting today although she did vomit yesterday.  Objective: Vital signs in last 24 hours: Temp:  [98.2 F (36.8 C)-98.7 F (37.1 C)] 98.7 F (37.1 C) (11/21 1610) Pulse Rate:  [73-78] 75  (11/21 0608) Resp:  [18-20] 20  (11/21 9604) BP: (132-154)/(61-65) 142/65 mmHg (11/21 0608) SpO2:  [100 %] 100 % (11/21 0608) Last BM Date: 11/01/12  Intake/Output from previous day: 11/20 0701 - 11/21 0700 In: 3244 [P.O.:720; I.V.:2514] Out: 67 [Urine:3; Drains:50] Intake/Output this shift:    General: No distress  Lungs: Clear  Abd: Soft, good bowel sounds.  Non-tender.  Drain still putting out feculent fluid.  No fever.  Edmon Crape starting to become more formed.  Extremities: No DVT signs or symptoms.  Neuro: Intact  Lab Results:   WBC is normal, Hgb a bit low at 10.2 BMET  Basename 11/02/12 0520  NA 136  K 5.0  CL 101  CO2 26  GLUCOSE 104*  BUN 4*  CREATININE 0.80  CALCIUM 8.1*   PT/INR No results found for this basename: LABPROT:2,INR:2 in the last 72 hours ABG No results found for this basename: PHART:2,PCO2:2,PO2:2,HCO3:2 in the last 72 hours  Studies/Results: No results found.  Anti-infectives: Anti-infectives     Start     Dose/Rate Route Frequency Ordered Stop   10/28/12 0800   ciprofloxacin (CIPRO) IVPB 400 mg        400 mg 200 mL/hr over 60 Minutes Intravenous 2 times daily 10/28/12 0724     10/27/12 1000   ciprofloxacin (CIPRO) IVPB 400 mg  Status:  Discontinued        400 mg 200 mL/hr over 60 Minutes Intravenous Every 12 hours 10/26/12 2314 10/27/12 1359   10/27/12 0600   metroNIDAZOLE (FLAGYL) IVPB 500 mg        500 mg 100 mL/hr over 60 Minutes Intravenous Every 8 hours 10/26/12 2314     10/26/12 1900   ciprofloxacin (CIPRO) IVPB 400 mg        400 mg 200 mL/hr over 60 Minutes Intravenous  Once 10/26/12 1847 10/26/12 2148   10/26/12 1900   metroNIDAZOLE  (FLAGYL) IVPB 500 mg        500 mg 100 mL/hr over 60 Minutes Intravenous  Once 10/26/12 1847 10/26/12 2041          Assessment/Plan: s/p  Continue ABX therapy due to Post-op infection Will repeat CT scan of the abdomen and pelvis to asses resolution of the abscess. Must consider possibility of malignant process leading to fistulization. IF CT tomorrow improved, may be able to discharge with drain and home health.  LOS: 7 days   Marta Lamas. Gae Bon, MD, FACS [phone] [facility] 11/02/2012

## 2012-11-02 NOTE — Progress Notes (Signed)
[facility] PROGRESS NOTE  GENESEE NASE MWU:[id number] DOB: 07-Jul-1946 DOA: 10/26/2012 PCP: Danise Edge, MD  Assessment/Plan: Sigmoid diverticulits w/ abcess  status post percutaneous drainage via transgluteal approach - continue Cipro and Flagyl - given stool in drain output, concerns for fistula are present - defer further management to General Surgery - IR plans to f/u with CT and drain injection 11/22   Persistent diarrhea  Due to above - Gen Surg is managing diet , currently on dysphagia 3 with thin liquids Elevated liver enzymes  Unclear etiology - evaluate liver parenchyma at time of CT scan on 11/22  History hypothyroidism secondary to radio ablation for thyroid cancer  continue customized dosages of levothyroxine  History of abnormal cervical cytology  per Dr. Renaldo Fiddler - hold Estrace 0.1 mg  HLD  hold statin for now due to transaminitis  Hypokalemia  probably secondary to ongoing diarrheal losses - now much improved - slow replacement and follow . Potassium level today at 5, discontinue supplemental potassium HTN  Reasonably controlled at present - follow w/o change today  Hyponatremia  probably 2/2 to diuretic and ongoing GI losses - resolved  OSA (obstructive sleep apnea)  Cont QHS CPAP   Code Status: Full code Family Communication: Discussed with patient plus family at bedside Disposition Plan: Possibly home, but patient will be here for at least the next few days   Consultants:  Interventional radiology  General surgery  Procedures:  Percutaneous drainage of diverticular abscess done 11/15  Antibiotics:  IV Cipro day 7 started 11/14  IV Flagyl day 7 started 11/14  HPI/Subjective: 66 yr old F admitted from PCP office with a h/o intractable n/v/ diarrhea. Of note, she went to her Ob-Gyn on 10/23 or 10/24 and received a 2 week dose of Macrobid for a presumed UTI. She was found on CT scan on admission to have a diverticular abscess and then had a  percutaneous drainage by interventional radiology 10/27/2012 which yielded frank pus. Patient did well subsequent drainage placement and has been followed by general surgery and interventional radiology closely. The drain has put out progressively less and less purulent material and general surgery is of the opinion patient will benefit from a repeat CT scan to ensure absence has been completely drained versus whether this communicates with intraluminal in with the colon.  Patient will continue IV antibiotics until further imaging is done.  Patient diet was graduated from clear liquids as tolerated on 10/29/2012.  It was noted that her output in the drain became stool color 111/18 after graduation of diet and she was back down off of regular diet back to the liquids  Patient today doing okay. Currently getting a pedicure. No complaints, occasionally get some abdominal pain when she stretches her back. Anxious about CT tomorrow   Objective: Filed Vitals:   11/01/12 1433 11/01/12 2129 11/02/12 0608 11/02/12 1349  BP: 154/63 132/61 142/65 151/67  Pulse: 78 73 75 73  Temp: 98.7 F (37.1 C) 98.2 F (36.8 C) 98.7 F (37.1 C) 98.9 F (37.2 C)  TempSrc: Oral Oral  Oral  Resp: 20 18 20 20   Height:      Weight:      SpO2: 100% 100% 100% 100%    Intake/Output Summary (Last 24 hours) at 11/02/12 1659 Last data filed at 11/02/12 1500  Gross per 24 hour  Intake   1964 ml  Output    162 ml  Net   1802 ml   Filed Weights   10/26/12  2313  Weight: 92.534 kg (204 lb)    Exam:   General:  Alert and oriented x3, no acute distress, fatigued, looks younger than stated age  Cardiovascular: Regular rate and rhythm, S1-S2  Respiratory: Clear to auscultation bilaterally  Abdomen: Soft, obese, hypoactive bowel sounds, nontender  Extremities: 1+ pitting edema no clubbing or cyanosis  Data Reviewed: Basic Metabolic Panel:  Lab 11/02/12 1610 10/30/12 0515 10/29/12 0454 10/28/12 0913 10/26/12  1944  NA 136 135 132* 131* 123*  K 5.0 4.5 3.8 3.1* 3.6  CL 101 102 99 99 87*  CO2 26 23 24 24 24   GLUCOSE 104* 111* 120* 152* 106*  BUN 4* 5* 8 12 13   CREATININE 0.80 0.74 0.81 0.99 0.89  CALCIUM 8.1* 8.0* 7.7* 8.1* 8.2*  MG -- -- -- -- --  PHOS -- -- -- -- --   Liver Function Tests:  Lab 10/30/12 0515 10/29/12 0454  AST 47* 20  ALT 43* 37*  ALKPHOS 242* 112  BILITOT 0.2* 0.1*  PROT 5.5* 5.2*  ALBUMIN 2.1* 1.9*   CBC:  Lab 11/02/12 0520 10/30/12 0515 10/29/12 0454 10/28/12 0913 10/26/12 1944  WBC 9.0 11.4* 13.8* 14.9* 20.1*  NEUTROABS -- -- 10.8* -- 16.9*  HGB 10.2* 9.5* 9.1* 9.0* 10.9*  HCT 31.0* 28.8* 27.1* 27.5* 31.1*  MCV 90.1 87.0 87.7 87.9 85.0  PLT 406* 364 318 310 372      Collection Time   10/27/12  4:34 PM      Component Value Range Status Comment   Specimen Description ABSCESS DRAINAGE   Final    Special Requests DIVERTICULAR ABSCESS   Final    Gram Stain     Final    Value: MODERATE WBC PRESENT,BOTH PMN AND MONONUCLEAR     NO SQUAMOUS EPITHELIAL CELLS SEEN     MODERATE GRAM POSITIVE COCCI IN CLUSTERS     IN PAIRS FEW GRAM POSITIVE RODS   Culture     Final    Value: MULTIPLE ORGANISMS PRESENT, NONE PREDOMINANT     Note: NO STAPHYLOCOCCUS AUREUS ISOLATED NO GROUP A STREP (S.PYOGENES) ISOLATED   Report Status 10/30/2012 FINAL   Final          CLOSTRIDIUM DIFFICILE BY PCR     Status: Normal   Collection Time   10/29/12  2:04 PM      Component Value Range Status Comment   C difficile by pcr NEGATIVE  NEGATIVE Final      Studies: No results found.  Scheduled Meds:    . ciprofloxacin  400 mg Intravenous BID  . feeding supplement  1 Container Oral BID BM  . heparin  5,000 Units Subcutaneous Q8H  . levothyroxine  150 mcg Oral Custom  . levothyroxine  225 mcg Oral Custom  . magic mouthwash w/lidocaine  5 mL Oral BID  . metronidazole  500 mg Intravenous Q8H  . [DISCONTINUED] potassium chloride  40 mEq Oral BID   Continuous Infusions:    .  dextrose 5 % and 0.9% NaCl 50 mL/hr at 11/02/12 0700    Active Problems:  OBSTRUCTIVE SLEEP APNEA  HYPERTENSION  G E R D  Thyroid disease  Constipation  Diverticulitis    Time spent: 20 min    Hollice Espy  [facility] Pager [phone]. If 8PM-8AM, please contact night-coverage at [email or web address], password [facility] 11/02/2012, 4:59 PM  LOS: 7 days

## 2012-11-02 NOTE — Progress Notes (Signed)
Pt has home CPAP and wishes to place self on machine when she is ready to go to sleep. RT told patient if she had and question/concerns please contact respiratory or RN.

## 2012-11-02 NOTE — Progress Notes (Signed)
  Subjective: Pt feeling a little better today; still with occ loose stool, nausea  Objective: Vital signs in last 24 hours: Temp:  [98.2 F (36.8 C)-98.9 F (37.2 C)] 98.9 F (37.2 C) (11/21 1349) Pulse Rate:  [73-75] 73  (11/21 1349) Resp:  [18-20] 20  (11/21 1349) BP: (132-151)/(61-67) 151/67 mmHg (11/21 1349) SpO2:  [100 %] 100 % (11/21 1349) Last BM Date: 11/03/12  Intake/Output from previous day: 11/20 0701 - 11/21 0700 In: 3244 [P.O.:720; I.V.:2514] Out: 74 [Urine:3; Drains:50] Intake/Output this shift: Total I/O In: -  Out: 110 [Drains:110]  TG drain intact, dressing dry, output 110 cc's liquid stool  Lab Results:   Basename 11/02/12 0520  WBC 9.0  HGB 10.2*  HCT 31.0*  PLT 406*   BMET  Basename 11/02/12 0520  NA 136  K 5.0  CL 101  CO2 26  GLUCOSE 104*  BUN 4*  CREATININE 0.80  CALCIUM 8.1*   PT/INR No results found for this basename: LABPROT:2,INR:2 in the last 72 hours ABG No results found for this basename: PHART:2,PCO2:2,PO2:2,HCO3:2 in the last 72 hours  Studies/Results: No results found.  Anti-infectives: Anti-infectives     Start     Dose/Rate Route Frequency Ordered Stop   10/28/12 0800   ciprofloxacin (CIPRO) IVPB 400 mg        400 mg 200 mL/hr over 60 Minutes Intravenous 2 times daily 10/28/12 0724     10/27/12 1000   ciprofloxacin (CIPRO) IVPB 400 mg  Status:  Discontinued        400 mg 200 mL/hr over 60 Minutes Intravenous Every 12 hours 10/26/12 2314 10/27/12 1359   10/27/12 0600   metroNIDAZOLE (FLAGYL) IVPB 500 mg        500 mg 100 mL/hr over 60 Minutes Intravenous Every 8 hours 10/26/12 2314     10/26/12 1900   ciprofloxacin (CIPRO) IVPB 400 mg        400 mg 200 mL/hr over 60 Minutes Intravenous  Once 10/26/12 1847 10/26/12 2148   10/26/12 1900   metroNIDAZOLE (FLAGYL) IVPB 500 mg        500 mg 100 mL/hr over 60 Minutes Intravenous  Once 10/26/12 1847 10/26/12 2041          Assessment/Plan: s/p pelvic/divert  abscess drain 11/15; check CT 11/22. Other plans as per [facility]. Will need drain injection before consideration given to removal.  LOS: 7 days    Crystal Ellwood,D [facility] 11/02/2012

## 2012-11-03 ENCOUNTER — Inpatient Hospital Stay (HOSPITAL_COMMUNITY): Payer: Medicare Other

## 2012-11-03 LAB — CULTURE, BLOOD (ROUTINE X 2)
Culture: NO GROWTH
Culture: NO GROWTH

## 2012-11-03 MED ORDER — IOHEXOL 300 MG/ML  SOLN
20.0000 mL | INTRAMUSCULAR | Status: AC
  Administered 2012-11-03 (×2): 20 mL via ORAL

## 2012-11-03 MED ORDER — PROMETHAZINE HCL 25 MG PO TABS: 25.0000 mg | ORAL_TABLET | Freq: Three times a day (TID) | ORAL | Status: DC | PRN

## 2012-11-03 MED ORDER — IOHEXOL 300 MG/ML  SOLN
100.0000 mL | Freq: Once | INTRAMUSCULAR | Status: AC | PRN
  Administered 2012-11-03: 100 mL via INTRAVENOUS

## 2012-11-03 MED ORDER — OXYCODONE HCL 5 MG PO TABS: 5.0000 mg | ORAL_TABLET | ORAL | Status: DC | PRN

## 2012-11-03 MED ORDER — CIPROFLOXACIN HCL 500 MG PO TABS: 500.0000 mg | ORAL_TABLET | Freq: Two times a day (BID) | ORAL | Status: AC

## 2012-11-03 MED ORDER — METRONIDAZOLE 500 MG PO TABS: 500.0000 mg | ORAL_TABLET | Freq: Three times a day (TID) | ORAL | Status: AC

## 2012-11-03 NOTE — Progress Notes (Signed)
The patient will follow up with a PA at the [facility] at our office on 11-14-12 for a drain check.  She will then follow up with Dr. Lindie Spruce on 11-21-12 at 12:00pm for a follow up visit.  Trasean Delima E 11/03/2012 4:27 PM

## 2012-11-03 NOTE — Discharge Summary (Signed)
Physician Discharge Summary  [name]:[id number] DOB: 07/11/1946 DOA: 10/26/2012  PCP: Danise Edge, MD  Admit date: 10/26/2012 Discharge date: 11/03/2012  Time spent: 30 minutes  Recommendations for Outpatient Follow-up:  Patient is being discharged with home health RN for assistance with drain. She will followup with Gen. surgery in 2 weeks for wound care check, and in 3 weeks will see Dr. Lindie Spruce in the office She will follow up with her primary care physician in one month-at that time repeat liver function tests are recommended History of abnormal cervical cytology-as per Dr. Renaldo Fiddler, patient's Estrace is being held  Discharge Diagnoses:  Active Problems:  OBSTRUCTIVE SLEEP APNEA  HYPERTENSION  G E R D  Thyroid disease  Constipation  Diverticulitis   Discharge Condition: Improved, being discharged home  Diet recommendation: Carb modified heart healthy  Filed Weights   10/26/12 2313  Weight: 92.534 kg (204 lb)    History of present illness:  Jackie Stevens is an 66 y.o. female with hx of known diverticulosis, last colonoscopy was over 5 years ago, Hx of sleep apnea, hypothyroidism, HTN, thyroid cancer, Hyperlipidemia, sent to the ER by her PCP Dr Abner Greenspan after being tx emperically with Cipro/Flagyl for diverticulitis. Her outpatient abdominal pelvic CT confirmed acute sigmoid diverticulitis with large complex abcess of 9x7x6.5 cm. Evaluation in the ER showed Na 123, on HCTZ, leukocytosis with WBC of 20K, and anemia, with Hb of 10.9 G/DL. It should be noted that she had hysterectomy. After the EDP consulted Dr Lindie Spruce of surgical service, hospitalist was asked to admit her for acute diverticulitis and diverticular abcess with hypovolemic hyponatremia.  Hospital Course:  Active Problems:   Diverticulitis with secondary abscess: This was the principal problem. Upon admission, patient was started on IV antibiotics. Surgery is following as well. Interventional radiology was  consulted and patient underwent CT-guided drainage of pelvic and diverticular abscess which was done on 11/15. No immediate complications were present. A drain was placed. Patient continued to have some drainage. She spent the next week with pain control and her diet was slowly advanced. She still having some moderate account pain and continued drainage. She had a followup CT scan done on 11/22 which noted significant improvement, specifically much of the abscess was gone the 2 residual small collections of fluid and some secondary reactive inflammation. At this point the patient is feeling much better, was afebrile. Her white count was normalized. She does but had received 7 days of IV antibiotics. Given ongoing collection of abscess, we'll plan to discharge the patient on by mouth antibiotics for another week. She will go home with the drain and home health to help with wound care. Wound care check will be done in 2 weeks time at the surgery clinic and then a followup appointment in 3 weeks with the general surgeon, Dr. Lindie Spruce. She'll followup with her primary care physician in one month   OBSTRUCTIVE SLEEP APNEA: Patient was continued on nightly CPAP. Stable.   HYPERTENSION: Relatively well controlled at this point. She will continue home medications.  Transaminitis: Patient's initial set of liver function tests were relatively normal on admission, but then increased the following day with elevation of alkaline phosphatase to 242 and minimally elevated transaminases with 47 and 43 and AST and ALT respectively. Her repeat CT scan noted some slight increase in the common bile the but no signs of any stricture or obstruction. Given at this point patient is tolerating by mouth, the plan will be to repeat lab  work in one [employer] time.   Thyroid disease: Patient was continued on her home doses of Synthroid   Hyponatremia: Initially felt to be secondary diuretic ongoing GI losses, this was since resolved  medications restarted   Procedures:  Percutaneous drainage of diverticular abscess done 11/15  Consultations:  Dr. Megan Mans, general surgery   interventional radiology  Discharge Exam: Filed Vitals:   11/02/12 1349 11/02/12 2118 11/03/12 0517 11/03/12 1415  BP: 151/67 141/59 173/69 142/66  Pulse: 73 75 89 81  Temp: 98.9 F (37.2 C) 98.7 F (37.1 C) 99 F (37.2 C) 98.3 F (36.8 C)  TempSrc: Oral Oral  Oral  Resp: 20 20 18 18   Height:      Weight:      SpO2: 100% 100% 97% 100%    General: Alert and oriented x3, no acute distress, fatigued, looks younger than stated age Cardiovascular: Regular rate and rhythm, S1-S2 Respiratory: Clear to auscultation bilaterally Abdomen: Soft, obese, and nontender, hypoactive bowel sounds, drain in place Extremities: No clubbing or cyanosis, trace pitting edema  Discharge Instructions  Discharge Orders    Future Appointments: Provider: Department: Dept Phone: Center:   11/16/2012 8:30 AM Mardella Layman, MD [facility] [phone] [facility]   11/21/2012 12:00 PM Cherylynn Ridges, MD [facility], [place] [phone] None   07/25/2013 11:00 AM Waymon Budge, MD [facility] [phone] None     Future Orders Please Complete By Expires   Diet - low sodium heart healthy      Increase activity slowly      Discharge wound care:      Comments:   Drain care daily.       Medication List     As of 11/03/2012  5:06 PM    TAKE these medications         aspirin EC 81 MG tablet   Take 81 mg by mouth daily.      ciprofloxacin 500 MG tablet   Commonly known as: CIPRO   Take 1 tablet (500 mg total) by mouth 2 (two) times daily.      estradiol 0.1 MG/GM vaginal cream   Commonly known as: ESTRACE   Place 2 g vaginally 2 (two) times a week. On Sundays and Wednesdays      etodolac 400 MG tablet   Commonly known as: LODINE   Take 400 mg by mouth 2 (two) times daily.      Krill Oil 300 MG  Caps   Take 300 mg by mouth daily.      levothyroxine 150 MCG tablet   Commonly known as: SYNTHROID, LEVOTHROID   Take 150-225 mcg by mouth daily. 1 tablet (150 mcg) every day except Sunday, 1 1/2 tablets (225 mcg) on Sunday      metoCLOPramide 10 MG tablet   Commonly known as: REGLAN   Take 10 mg by mouth at bedtime.      metroNIDAZOLE 500 MG tablet   Commonly known as: FLAGYL   Take 1 tablet (500 mg total) by mouth 3 (three) times daily.      nitroGLYCERIN 0.4 MG SL tablet   Commonly known as: NITROSTAT   Place 0.4 mg under the tongue every 5 (five) minutes as needed. For chest pain      oxyCODONE 5 MG immediate release tablet   Commonly known as: Oxy IR/ROXICODONE   Take 1-2 tablets (5-10 mg total) by mouth every 4 (four) hours as needed.  PRILOSEC OTC 20 MG tablet   Generic drug: omeprazole   Take 20 mg by mouth 2 (two) times daily as needed. For acid reflux      promethazine 25 MG tablet   Commonly known as: PHENERGAN   Take 1 tablet (25 mg total) by mouth every 8 (eight) hours as needed for nausea.      psyllium 58.6 % powder   Commonly known as: METAMUCIL   Take 1 packet by mouth at bedtime.      simvastatin 40 MG tablet   Commonly known as: ZOCOR   Take 40 mg by mouth at bedtime.      traMADol 50 MG tablet   Commonly known as: ULTRAM   Take 50 mg by mouth at bedtime.      valsartan-hydrochlorothiazide 320-25 MG per tablet   Commonly known as: DIOVAN-HCT   Take 1 tablet by mouth daily.      vitamin B-12 1000 MCG tablet   Commonly known as: CYANOCOBALAMIN   Take 1,000 mcg by mouth daily.           Follow-up Information    Follow up with Cherylynn Ridges, MD. On 11/21/2012. (12:00pm)    Contact information:   [address] [place]  [place] [address] [phone]       Follow up with [facility]. On 11/14/2012. (for a drain check prior to seeing Dr. Lindie Spruce, 3:45, arrive at 3:15pm)    Contact  information:   [address]   [place] [address] [phone]       Follow up with Danise Edge, MD. Schedule an appointment as soon as possible for a visit in 1 month.   Contact information:   [address] [place] [address] [phone]           The results of significant diagnostics from this hospitalization (including imaging, microbiology, ancillary and laboratory) are listed below for reference.    Significant Diagnostic Studies: Ct Guided Abscess Drain  10/27/2012    Impression:  Successful CT guided placement of a 10 French all purpose drain catheter into the pelvis via left-sided transgluteal approach with aspiration of 80 mL of purulent fluid.  Samples were sent to the laboratory as requested by the ordering clinical team.   Original Report Authenticated By: Tacey Ruiz, MD    Ct Abdomen Pelvis W Contrast  11/03/2012  IMPRESSION: 1.  Interval placement of a percutaneous drainage catheter into the previously noted diverticular abscess which has significantly decreased in size.  Today's study demonstrates two residual collections in the pelvis immediately adjacent one another, which likely intercommunicate, as detailed above.  There are some perirectal inflammatory changes and a small amount of perirectal fluid which is presumably reactive.  Overall the appearance has significantly improved compared to the prior examination. 2.  Interval development of mild intrahepatic and extrahepatic biliary ductal dilatation, as above.  This may simply reflect stasis related to decreased oral intake, and is felt unlikely to represent evidence of a distal common bile duct stricture (given that there was no biliary ductal dilatation on 10/26/2012). Correlation with liver function tests may be warranted, however. 3.  Normal appendix.   Original Report Authenticated By: Trudie Reed, M.D.    Ct Abdomen Pelvis W Contrast  10/26/2012    IMPRESSION: Acute sigmoid  diverticulitis with a large complex pelvic abscess.  The patient does have bladder prolapse.   Original Report  Authenticated By: Francene Boyers, M.D.    Dg Abd 2 Views  10/25/2012    IMPRESSION: Nonspecific nonobstructive bowel gas pattern.   Original Report Authenticated By: Natasha Mead, M.D.     Microbiology: Recent Results (from the past 240 hour(s))  URINE CULTURE     Status: Normal   Collection Time   10/25/12  1:39 PM      Component Value Range Status Comment   Colony Count 60,000 COLONIES/ML   Final    Organism ID, Bacteria Multiple bacterial morphotypes present, none   Final    Organism ID, Bacteria predominant. Suggest appropriate recollection if    Final    Organism ID, Bacteria clinically indicated.   Final   CULTURE, ROUTINE-ABSCESS     Status: Normal   Collection Time   10/27/12  4:34 PM      Component Value Range Status Comment   Specimen Description ABSCESS DRAINAGE   Final    Special Requests DIVERTICULAR ABSCESS   Final    Gram Stain     Final    Value: MODERATE WBC PRESENT,BOTH PMN AND MONONUCLEAR     NO SQUAMOUS EPITHELIAL CELLS SEEN     MODERATE GRAM POSITIVE COCCI IN CLUSTERS     IN PAIRS FEW GRAM POSITIVE RODS   Culture     Final    Value: MULTIPLE ORGANISMS PRESENT, NONE PREDOMINANT     Note: NO STAPHYLOCOCCUS AUREUS ISOLATED NO GROUP A STREP (S.PYOGENES) ISOLATED   Report Status 10/30/2012 FINAL   Final   CULTURE, BLOOD (ROUTINE X 2)     Status: Normal   Collection Time   10/27/12  7:00 PM      Component Value Range Status Comment   Specimen Description BLOOD HAND LEFT   Final    Special Requests BOTTLES DRAWN AEROBIC ONLY 10CC   Final    Culture  Setup Time 10/28/2012 01:53   Final    Culture NO GROWTH 5 DAYS   Final    Report Status 11/03/2012 FINAL   Final   CULTURE, BLOOD (ROUTINE X 2)     Status: Normal   Collection Time   10/27/12  7:11 PM      Component Value Range Status Comment   Specimen Description BLOOD ARM LEFT   Final    Special  Requests BOTTLES DRAWN AEROBIC ONLY 8CC   Final    Culture  Setup Time 10/28/2012 01:53   Final    Culture NO GROWTH 5 DAYS   Final    Report Status 11/03/2012 FINAL   Final   CLOSTRIDIUM DIFFICILE BY PCR     Status: Normal   Collection Time   10/29/12  2:04 PM      Component Value Range Status Comment   C difficile by pcr NEGATIVE  NEGATIVE Final      Labs: Basic Metabolic Panel:  Lab 11/02/12 1914 10/30/12 0515 10/29/12 0454 10/28/12 0913  NA 136 135 132* 131*  K 5.0 4.5 3.8 3.1*  CL 101 102 99 99  CO2 26 23 24 24   GLUCOSE 104* 111* 120* 152*  BUN 4* 5* 8 12  CREATININE 0.80 0.74 0.81 0.99  CALCIUM 8.1* 8.0* 7.7* 8.1*  MG -- -- -- --  PHOS -- -- -- --   Liver Function Tests:  Lab 10/30/12 0515 10/29/12 0454  AST 47* 20  ALT 43* 37*  ALKPHOS 242* 112  BILITOT 0.2* 0.1*  PROT 5.5* 5.2*  ALBUMIN 2.1* 1.9*   CBC:  Lab 11/02/12 0520 10/30/12 0515 10/29/12 0454 10/28/12 0913  WBC 9.0 11.4* 13.8* 14.9*  NEUTROABS -- -- 10.8* --  HGB 10.2* 9.5* 9.1* 9.0*  HCT 31.0* 28.8* 27.1* 27.5*  MCV 90.1 87.0 87.7 87.9  PLT 406* 364 318 310       Signed:  Ozell Ferrera K  [facility] 11/03/2012, 5:06 PM

## 2012-11-03 NOTE — Care Management Note (Signed)
  Page 2 of 2   11/03/2012     3:18:10 PM   CARE MANAGEMENT NOTE 11/03/2012  Patient:  Jackie Stevens, Jackie Stevens   Account Number:  [id number]  Date Initiated:  10/27/2012  Documentation initiated by:  Ronny Flurry  Subjective/Objective Assessment:     Action/Plan:   Anticipated DC Date:     Anticipated DC Plan:  HOME W HOME HEALTH SERVICES         Choice offered to / List presented to:             Status of service:  In process, will continue to follow Medicare Important Message given?   (If response is "NO", the following Medicare IM given date fields will be blank) Date Medicare IM given:   Date Additional Medicare IM given:    Discharge Disposition:    Per UR Regulation:  Reviewed for med. necessity/level of care/duration of stay  If discussed at Long Length of Stay Meetings, dates discussed:    Comments:  11-03-12 Met with patient , her husband and her son.  Patient is in agreement for [facility] ( mentioned in progress note ) , provided list of home health agencies , preference is [place] . Son is willing to assist his mother with drain / wound care at home .  Will await orders  Facesheet information is correct , [phone] is patient's cell phone . Patient prefers home health agency to use her husband's cell phone which is [phone]  Ronny Flurry RN BSn [phone]     Diverticulitis with pelvic abscess Needs IV antibiotics and percutaneous drainage of pelvic abscess If improves with this treatment then she can be fed and discharged eventually. If she does not improve, she will need surgery with the possibility of a colostomy  10-30-12 PA note : Diverticulitis with pelvic abscess Cipro and Flagyl IV Feculent output concerning for fistula WBC trending down   Ronny Flurry RN BSN [phone]

## 2012-11-03 NOTE — Progress Notes (Signed)
  Subjective: Had a really good day yesterday, had allot of nausea this AM.  Doing better now.  Awaiting study.  Objective: Vital signs in last 24 hours: Temp:  [98.7 F (37.1 C)-99 F (37.2 C)] 99 F (37.2 C) (11/22 0517) Pulse Rate:  [73-89] 89  (11/22 0517) Resp:  [18-20] 18  (11/22 0517) BP: (141-173)/(59-69) 173/69 mmHg (11/22 0517) SpO2:  [97 %-100 %] 97 % (11/22 0517) Last BM Date: 11/02/12  Diet: DIII, afebrile, VSS, No labs, Ct and drain injection for today  Intake/Output from previous day: 11/21 0701 - 11/22 0700 In: 1592 [I.V.:1587] Out: 120 [Drains:120] Intake/Output this shift:    General appearance: alert, cooperative and no distress GI: soft, non-tender; bowel sounds normal; no masses,  no organomegaly and drainage is still feculent in nature.  Lab Results:   [facility] 11/02/12 0520  WBC 9.0  HGB 10.2*  HCT 31.0*  PLT 406*    BMET  Basename 11/02/12 0520  NA 136  K 5.0  CL 101  CO2 26  GLUCOSE 104*  BUN 4*  CREATININE 0.80  CALCIUM 8.1*   PT/INR No results found for this basename: LABPROT:2,INR:2 in the last 72 hours   Lab 10/30/12 0515 10/29/12 0454  AST 47* 20  ALT 43* 37*  ALKPHOS 242* 112  BILITOT 0.2* 0.1*  PROT 5.5* 5.2*  ALBUMIN 2.1* 1.9*     Lipase  No results found for this basename: lipase     Studies/Results: No results found.  Medications:    . ciprofloxacin  400 mg Intravenous BID  . feeding supplement  1 Container Oral BID BM  . heparin  5,000 Units Subcutaneous Q8H  . [COMPLETED] iohexol  20 mL Oral Q1 Hr x 2  . levothyroxine  150 mcg Oral Custom  . levothyroxine  225 mcg Oral Custom  . magic mouthwash w/lidocaine  5 mL Oral BID  . metronidazole  500 mg Intravenous Q8H  . [DISCONTINUED] potassium chloride  40 mEq Oral BID    Assessment/Plan Sigmoid diverticulitis with abscess. They #3 status post percutaneous drainage (perc drain 10/27/2012)  HTN (hypertension)  OSA (obstructive sleep apnea  GERD  (gastroesophageal reflux disease)  History of thyroid cancer  Hyperlipidemia  Constipation/Diverticulitis  Body mass index is 37.3  Plan:  CT and drain injection today.  LOS: 8 days    Nichole Keltner 11/03/2012

## 2012-11-03 NOTE — Progress Notes (Signed)
Patient discharged to home, with instructions. 

## 2012-11-04 NOTE — Progress Notes (Signed)
Her improvement on CT should be okay for home.  I wll see her in a couple of weeks.  Jackie Stevens. Gae Bon, MD, FACS [phone] [facility] Surgery

## 2012-11-05 ENCOUNTER — Encounter (HOSPITAL_COMMUNITY): Payer: Self-pay | Admitting: *Deleted

## 2012-11-05 ENCOUNTER — Emergency Department (HOSPITAL_COMMUNITY): Payer: Medicare Other

## 2012-11-05 ENCOUNTER — Emergency Department (HOSPITAL_COMMUNITY)
Admission: EM | Admit: 2012-11-05 | Discharge: 2012-11-06 | Disposition: A | Discharge status: Home / Self Care | Payer: Medicare Other | Attending: Emergency Medicine | Admitting: Emergency Medicine

## 2012-11-05 DIAGNOSIS — K219 Gastro-esophageal reflux disease without esophagitis: Secondary | ICD-10-CM | POA: Insufficient documentation

## 2012-11-05 DIAGNOSIS — E785 Hyperlipidemia, unspecified: Secondary | ICD-10-CM | POA: Insufficient documentation

## 2012-11-05 DIAGNOSIS — G4733 Obstructive sleep apnea (adult) (pediatric): Secondary | ICD-10-CM | POA: Insufficient documentation

## 2012-11-05 DIAGNOSIS — Z79899 Other long term (current) drug therapy: Secondary | ICD-10-CM | POA: Insufficient documentation

## 2012-11-05 DIAGNOSIS — Z7982 Long term (current) use of aspirin: Secondary | ICD-10-CM | POA: Insufficient documentation

## 2012-11-05 DIAGNOSIS — Z8719 Personal history of other diseases of the digestive system: Secondary | ICD-10-CM | POA: Insufficient documentation

## 2012-11-05 DIAGNOSIS — K5732 Diverticulitis of large intestine without perforation or abscess without bleeding: Secondary | ICD-10-CM | POA: Insufficient documentation

## 2012-11-05 DIAGNOSIS — E079 Disorder of thyroid, unspecified: Secondary | ICD-10-CM | POA: Insufficient documentation

## 2012-11-05 DIAGNOSIS — K5792 Diverticulitis of intestine, part unspecified, without perforation or abscess without bleeding: Secondary | ICD-10-CM

## 2012-11-05 DIAGNOSIS — Z8619 Personal history of other infectious and parasitic diseases: Secondary | ICD-10-CM | POA: Insufficient documentation

## 2012-11-05 DIAGNOSIS — Z85850 Personal history of malignant neoplasm of thyroid: Secondary | ICD-10-CM | POA: Insufficient documentation

## 2012-11-05 LAB — CBC WITH DIFFERENTIAL/PLATELET
Basophils Absolute: 0 10*3/uL (ref 0.0–0.1)
Basophils Relative: 0 % (ref 0–1)
Eosinophils Absolute: 0.1 10*3/uL (ref 0.0–0.7)
Eosinophils Relative: 0 % (ref 0–5)
HCT: 33.1 % — ABNORMAL LOW (ref 36.0–46.0)
Hemoglobin: 11 g/dL — ABNORMAL LOW (ref 12.0–15.0)
Lymphocytes Relative: 15 % (ref 12–46)
Lymphs Abs: 1.9 10*3/uL (ref 0.7–4.0)
MCH: 28.9 pg (ref 26.0–34.0)
MCHC: 33.2 g/dL (ref 30.0–36.0)
MCV: 86.9 fL (ref 78.0–100.0)
Monocytes Absolute: 0.9 10*3/uL (ref 0.1–1.0)
Monocytes Relative: 7 % (ref 3–12)
Neutro Abs: 10.2 10*3/uL — ABNORMAL HIGH (ref 1.7–7.7)
Neutrophils Relative %: 78 % — ABNORMAL HIGH (ref 43–77)
Platelets: 426 10*3/uL — ABNORMAL HIGH (ref 150–400)
RBC: 3.81 MIL/uL — ABNORMAL LOW (ref 3.87–5.11)
RDW: 14.3 % (ref 11.5–15.5)
WBC: 13.1 10*3/uL — ABNORMAL HIGH (ref 4.0–10.5)

## 2012-11-05 LAB — URINALYSIS, ROUTINE W REFLEX MICROSCOPIC
Bilirubin Urine: NEGATIVE
Glucose, UA: NEGATIVE mg/dL
Ketones, ur: 15 mg/dL — AB
Nitrite: NEGATIVE
Protein, ur: NEGATIVE mg/dL
Specific Gravity, Urine: 1.009 (ref 1.005–1.030)
Urobilinogen, UA: 0.2 mg/dL (ref 0.0–1.0)
pH: 7 (ref 5.0–8.0)

## 2012-11-05 LAB — COMPREHENSIVE METABOLIC PANEL
ALT: 36 U/L — ABNORMAL HIGH (ref 0–35)
AST: 26 U/L (ref 0–37)
Albumin: 3.1 g/dL — ABNORMAL LOW (ref 3.5–5.2)
Alkaline Phosphatase: 126 U/L — ABNORMAL HIGH (ref 39–117)
BUN: 5 mg/dL — ABNORMAL LOW (ref 6–23)
CO2: 28 mEq/L (ref 19–32)
Calcium: 8.7 mg/dL (ref 8.4–10.5)
Chloride: 97 mEq/L (ref 96–112)
Creatinine, Ser: 0.85 mg/dL (ref 0.50–1.10)
GFR calc Af Amer: 80 mL/min — ABNORMAL LOW (ref >90)
GFR calc non Af Amer: 69 mL/min — ABNORMAL LOW (ref >90)
Glucose, Bld: 99 mg/dL (ref 70–99)
Potassium: 3.3 mEq/L — ABNORMAL LOW (ref 3.5–5.1)
Sodium: 137 mEq/L (ref 135–145)
Total Bilirubin: 0.2 mg/dL — ABNORMAL LOW (ref 0.3–1.2)
Total Protein: 6.3 g/dL (ref 6.0–8.3)

## 2012-11-05 LAB — URINE MICROSCOPIC-ADD ON

## 2012-11-05 MED ORDER — IOHEXOL 300 MG/ML  SOLN
100.0000 mL | Freq: Once | INTRAMUSCULAR | Status: AC | PRN
  Administered 2012-11-05: 100 mL via INTRAVENOUS

## 2012-11-05 MED ORDER — PROMETHAZINE HCL 25 MG PO TABS
25.0000 mg | ORAL_TABLET | Freq: Once | ORAL | Status: AC
  Administered 2012-11-05: 25 mg via ORAL
  Filled 2012-11-05: qty 1

## 2012-11-05 NOTE — ED Notes (Signed)
Pt transported back from CT.  

## 2012-11-05 NOTE — ED Provider Notes (Signed)
Medical screening examination/treatment/procedure(s) were conducted as a shared visit with non-physician practitioner(s) and myself.  I personally evaluated the patient during the encounter Has hx of diverticulitis. Today coughed and had stool come out of vagina.  pe performed by pa.  + stool in vagina.  No fever. No abd pain. Has seen dr. Lindie Spruce.  surg consult suggest redo ct and have f/u in office tomorrow with dr. Lindie Spruce.   Cheri Guppy, MD 11/05/12 2220

## 2012-11-05 NOTE — ED Notes (Addendum)
Patient had diverticulosis and CT scans.  She has 2 fissures in her intestines.  She has drains placed in her back to drain her intestines.  She states today while sitting in a chair,  She states she had onset of feeling wet.  When she checked herself, she noted she had incontinence of stool thru her vagina.  She continues to have leakage of stool,  [facility] performed her surgery on 11-02-12

## 2012-11-05 NOTE — ED Provider Notes (Signed)
History     CSN: [id number]  Arrival date & time 11/05/12  1630   First MD Initiated Contact with Patient 11/05/12 1705      Chief Complaint  Patient presents with  . Encopresis    (Consider location/radiation/quality/duration/timing/severity/associated sxs/prior treatment) HPI The patient presents with fecal contence from vagina that started today.  She has a past medical history of diverticulitis with known abscesses and one percutaneous drain in place on 10/27/12.  The family reports a decrease in output from the drain this morning, normal output is 30cc and was only 15 cc today. She denies nausea, vomiting, abdominal pain, fever or chills.  She reports bilateral LE edema since last hospitalization.   Denies shortness of breath or chest pain. Reports two loose stools today, and has had loose stools since last hospitalization.   Past Medical History  Diagnosis Date  . Allergic rhinitis   . HTN (hypertension)   . OSA (obstructive sleep apnea)   . GERD (gastroesophageal reflux disease)   . History of thyroid cancer   . Hyperlipidemia   . Chicken pox as a child  . Measles as a child  . Cancer 1980's    thyroid  . Insomnia   . Thyroid disease   . Constipation   . Diverticulitis 10/25/2012  . Abnormal cervical cytology 10/25/2012    Follows with Dr Maggie Font of Gyn    Past Surgical History  Procedure Date  . Thyroidectomy, partial 1988  . Tonsillectomy as a kid  . Total abdominal hysterectomy 70's  . Cholecystectomy 1990    Family History  Problem Relation Age of Onset  . Heart disease Father   . Pneumonia Father   . Hypertension Father   . Hyperlipidemia Father   . Cancer Father     skin  . Stroke Father   . Cancer Paternal Aunt   . Alzheimer's disease Mother   . Heart disease Mother   . Emphysema Brother     marijuana and cigarettes  . Alcohol abuse Brother   . Hearing loss Brother   . Diabetes Maternal Grandmother   . Alzheimer's disease Paternal  Grandmother   . Cancer Paternal Grandmother     lung?- smoker  . Hyperlipidemia Paternal Grandmother   . Heart attack Paternal Grandfather   . Alcohol abuse Paternal Grandfather     History  Substance Use Topics  . Smoking status: Never Smoker   . Smokeless tobacco: Never Used  . Alcohol Use: Yes     Comment: occasional    OB History    Grav Para Term Preterm Abortions TAB SAB Ect Mult Living                  Review of Systems All other systems negative except as documented in the HPI. All pertinent positives and negatives as reviewed in the HPI.  Allergies  Niacin and Neomycin-bacitracin zn-polymyx  Home Medications   Current Outpatient Rx  Name  Route  Sig  Dispense  Refill  . ASPIRIN EC 81 MG PO TBEC   Oral   Take 81 mg by mouth daily.         Marland Kitchen CIPROFLOXACIN HCL 500 MG PO TABS   Oral   Take 1 tablet (500 mg total) by mouth 2 (two) times daily.   20 tablet   0   . ETODOLAC 400 MG PO TABS   Oral   Take 400 mg by mouth 2 (two) times daily.          Marland Kitchen  KRILL OIL 300 MG PO CAPS   Oral   Take 300 mg by mouth daily.         Marland Kitchen LEVOTHYROXINE SODIUM 150 MCG PO TABS   Oral   Take 150-225 mcg by mouth daily. 1 tablet (150 mcg) every day except Sunday, 1 1/2 tablets (225 mcg) on Sunday         . METRONIDAZOLE 500 MG PO TABS   Oral   Take 1 tablet (500 mg total) by mouth 3 (three) times daily.   30 tablet   0   . OMEPRAZOLE MAGNESIUM 20 MG PO TBEC   Oral   Take 20 mg by mouth 2 (two) times daily as needed. For acid reflux         . OXYCODONE HCL 5 MG PO TABS   Oral   Take 1-2 tablets (5-10 mg total) by mouth every 4 (four) hours as needed.   30 tablet   0   . PROMETHAZINE HCL 25 MG PO TABS   Oral   Take 1 tablet (25 mg total) by mouth every 8 (eight) hours as needed for nausea.   20 tablet   0   . PSYLLIUM 58.6 % PO POWD   Oral   Take 1 packet by mouth at bedtime.         Marland Kitchen SIMVASTATIN 40 MG PO TABS   Oral   Take 40 mg by mouth at  bedtime.          Marland Kitchen VALSARTAN-HYDROCHLOROTHIAZIDE 320-25 MG PO TABS   Oral   Take 1 tablet by mouth daily.   90 tablet   3   . METOCLOPRAMIDE HCL 10 MG PO TABS   Oral   Take 10 mg by mouth at bedtime.          Marland Kitchen NITROGLYCERIN 0.4 MG SL SUBL   Sublingual   Place 0.4 mg under the tongue every 5 (five) minutes as needed. For chest pain         . TRAMADOL HCL 50 MG PO TABS   Oral   Take 50 mg by mouth at bedtime.         Marland Kitchen VITAMIN B-12 1000 MCG PO TABS   Oral   Take 1,000 mcg by mouth daily.           BP 155/83  Pulse 106  Temp 98 F (36.7 C)  Resp 20  Ht 5\' 2"  (1.575 m)  Wt 216 lb (97.977 kg)  BMI 39.51 kg/m2  SpO2 95%  Physical Exam  Constitutional: She appears well-developed and well-nourished.  Cardiovascular: Normal rate, regular rhythm, S1 normal and S2 normal.  Exam reveals no S3 and no S4.   Murmur heard.  Systolic murmur is present with a grade of 2/6       +1 pitting edema bilateral LE  Pulmonary/Chest: Effort normal. No respiratory distress. She has no decreased breath sounds. She has no wheezes. She has no rhonchi. She has no rales.  Abdominal: Soft. There is no tenderness. There is no rebound and no guarding.       Multiple ecchymosis in various stages of healing over lower abdomin.   Genitourinary: Uterus is tender. Vaginal discharge found.       Brown-yellow discharge with food material seen in vaginal vault.  Fistula tract not visualized.     ED Course  Procedures (including critical care time)  Labs Reviewed  CBC WITH DIFFERENTIAL - Abnormal; Notable for the following:  WBC 13.1 (*)     RBC 3.81 (*)     Hemoglobin 11.0 (*)     HCT 33.1 (*)     Platelets 426 (*)     Neutrophils Relative 78 (*)     Neutro Abs 10.2 (*)     All other components within normal limits  COMPREHENSIVE METABOLIC PANEL - Abnormal; Notable for the following:    Potassium 3.3 (*)     BUN 5 (*)     Albumin 3.1 (*)     ALT 36 (*)     Alkaline Phosphatase  126 (*)     Total Bilirubin 0.2 (*)     GFR calc non Af Amer 69 (*)     GFR calc Af Amer 80 (*)     All other components within normal limits  URINALYSIS, ROUTINE W REFLEX MICROSCOPIC - Abnormal; Notable for the following:    APPearance CLOUDY (*)     Hgb urine dipstick TRACE (*)     Ketones, ur 15 (*)     Leukocytes, UA LARGE (*)     All other components within normal limits  URINE MICROSCOPIC-ADD ON  URINE CULTURE   Ct Abdomen Pelvis W Contrast  11/05/2012  *RADIOLOGY REPORT*  Clinical Data: History of diverticulitis status post drain placement.  Now leaking stool from the vagina.  CT ABDOMEN AND PELVIS WITH CONTRAST  Technique:  Multidetector CT imaging of the abdomen and pelvis was performed following the standard protocol during bolus administration of intravenous contrast.  Contrast: OMNIPAQUE IOHEXOL 300 MG/ML  SOLN  Comparison: CT of the abdomen and pelvis 11/03/2012.  Findings:  Lung Bases: Subsegmental atelectasis in the lung bases bilaterally. Mild cardiomegaly.  Abdomen/Pelvis:  Status post cholecystectomy.  Mild intra and extrahepatic biliary ductal dilatation is again noted, however, this appears to have decreased compared to the recent prior examination, with the common bile duct now only measuring up to 9 mm in the porta hepatis.  No focal cystic or solid hepatic lesions are identified.  The enhanced appearance of the pancreas, spleen, bilateral adrenal glands and bilateral kidneys is unremarkable. Normal appendix.  A percutaneous drainage catheter is again noted adjacent to the inferior aspect of the distal sigmoid colon, immediately superior and posterior to the vaginal apex.  There is a small amount of gas and fluid adjacent to the tip of the catheter.  In the left side of the pelvis, immediately beneath the proximal sigmoid colon there is a complex rim enhancing bilobed gas fluid collection measuring approximately 5.3 x 3.7 cm in total size, which has an anteromedial  component that measures approximately 3.6 x 1.9 cm, and a posterolateral component that measures approximately 3.8 x 1.9 cm (images 72 - 75 of series 2). These findings are compatible with residual diverticular abscess, and it remains uncertain as to whether not this communicates with the abscess cavity located around the tip of the drainage catheter.  There continues to be extensive perirectal inflammation and inflammatory changes in the sigmoid mesocolon.  No frank pneumoperitoneum is identified at this time.  No significant volume of ascites.  Compared to the prior study there is more obvious laxity of the levator ani musculature, and there appears to be a resting cystocele (the urinary bladder extends well below the level of the pubococcygeal line on sagittal reformats at rest).  Notably, oral contrast was administered for the examination, and a small amount of oral contrast material is noted in the sigmoid colon and rectum.  No definite oral contrast material is seen within the vagina.  Musculoskeletal: There are no aggressive appearing lytic or blastic lesions noted in the visualized portions of the skeleton.  IMPRESSION: 1.  Complex diverticular abscess or abscesses redemonstrated, as above.  There is less gas and fluid around the pigtail drainage catheter that is immediately beneath the distal sigmoid colon, however, the diverticular abscess beneath the more proximal sigmoid colon is similar in size to the prior examination, as above.  It is unclear whether not these two abscess cavities intercommunicate or not. 2.  No definite gas, stool or oral contrast material is noted within the vagina on this examination. 3. Pelvic floor laxity with cystocele. 4. Interval decrease in mild intra and extrahepatic biliary ductal dilatation compared to prior study, suggesting against biliary tract obstruction. 5.  Additional incidental findings, as above.   Original Report Authenticated By: Trudie Reed, M.D.      Discussed patient with Dr. Carolynne Edouard, he suggested repeat CT with contrast and to follow up with her surgeon tomorrow.   1305 Discussed test results and need to follow up with Dr. Lindie Spruce tomorrow for evaluation of fistula.  Patient reports nausea.  MDM   MDM Reviewed: vitals and nursing note Reviewed previous: CT scan and labs Interpretation: labs and CT scan Consults: general surgery           Carlyle Dolly, PA-C 11/05/12 2322

## 2012-11-05 NOTE — ED Notes (Signed)
Patient transported to CT 

## 2012-11-05 NOTE — ED Notes (Signed)
PA at bedside.

## 2012-11-06 ENCOUNTER — Telehealth: Payer: Self-pay

## 2012-11-06 ENCOUNTER — Telehealth: Payer: Self-pay | Admitting: Family Medicine

## 2012-11-06 ENCOUNTER — Telehealth (INDEPENDENT_AMBULATORY_CARE_PROVIDER_SITE_OTHER): Payer: Self-pay | Admitting: General Surgery

## 2012-11-06 NOTE — Telephone Encounter (Signed)
Pt called to let Dr. Lindie Spruce know she was in the ER on Sunday, because she had "drainage" coming out her vagina.  A CT was done and a pelvic exam.  She states the PA who did the pelvic could not see any drainage at the time.  Pt has [facility] office appt on 11/14/12 and follow-up with Dr. Lindie Spruce on 11/21/12.

## 2012-11-06 NOTE — Telephone Encounter (Signed)
So unfortunately this sounds like a post op complication, she needs to call the surgeon for evaluation or go to ER.

## 2012-11-06 NOTE — ED Provider Notes (Signed)
Medical screening examination/treatment/procedure(s) were performed by non-physician practitioner and as supervising physician I was immediately available for consultation/collaboration.  Cheri Guppy, MD 11/06/12 0005

## 2012-11-06 NOTE — Telephone Encounter (Signed)
[facility] Triage Call Report Triage Record Num: [id number] Operator: Valene Bors Patient Name: Jackie Stevens Call Date & Time: 11/05/2012 3:00:11PM Patient Phone: [phone] PCP: Joaquin Courts Patient Gender: Female PCP Fax : [phone] Patient DOB: 1946-01-07 Practice Name: [facility] Reason for Call: Caller: Jamica/Patient; PCP: Other; CB#: [phone]; Call regarding Hospitalized 11/15-11/22/13- Divirticulosis with erupted pocket with 2 fissure, has a drain from lower back that is not really working today-11/05/12. Stool is draining from Vagina onset this morning -11/05/12. She is voiding QS. Pus is coming from insertion site of drain dispite cleaning and irrigating the line BID. Triage and Care advice per Postoperative Wound Care Protocol and MD on call contacted, Dr. Patsy Lager and he advised that she be checked in the ED. Protocol(s) Used: Postoperative Wound Care Recommended Outcome per Protocol: Call Provider Immediately Reason for Outcome: Drain does not drain as expected or comes out Care Advice: ~ 11/05/2012 3:24:10PM Page 1 of 1 [email or web address]

## 2012-11-06 NOTE — Telephone Encounter (Signed)
Left a detailed message on patients cell phone but it looks like Dr Patsy Lager already advised patient to go to ER

## 2012-11-06 NOTE — Telephone Encounter (Signed)
Jackie Stevens called to advise that they were not able to visit patient today due to recent hospitalization but they will be able to visit patient tomorrow 11/07/12 per patient's husband. Per patient's husband she is fine today, she is resting.

## 2012-11-06 NOTE — Telephone Encounter (Signed)
FYI

## 2012-11-07 LAB — URINE CULTURE: Colony Count: >=100000

## 2012-11-07 NOTE — Telephone Encounter (Signed)
Patient called today, she made an OV for a hospital fu. She also said that Genevieve Norlander is coming out to her house at 2:30 today. She is not need a CB at this time.

## 2012-11-08 NOTE — ED Notes (Signed)
+   urine patient treated with Cipro-sensitive to same-chart appended per protocol MD.

## 2012-11-13 ENCOUNTER — Ambulatory Visit (INDEPENDENT_AMBULATORY_CARE_PROVIDER_SITE_OTHER): Payer: Medicare Other | Admitting: Family Medicine

## 2012-11-13 ENCOUNTER — Encounter: Payer: Self-pay | Admitting: Family Medicine

## 2012-11-13 VITALS — BP 110/73 | HR 87 | Temp 98.4°F | Ht 62.0 in | Wt 197.8 lb

## 2012-11-13 DIAGNOSIS — K5732 Diverticulitis of large intestine without perforation or abscess without bleeding: Secondary | ICD-10-CM

## 2012-11-13 DIAGNOSIS — K219 Gastro-esophageal reflux disease without esophagitis: Secondary | ICD-10-CM

## 2012-11-13 DIAGNOSIS — I10 Essential (primary) hypertension: Secondary | ICD-10-CM

## 2012-11-13 DIAGNOSIS — K5900 Constipation, unspecified: Secondary | ICD-10-CM

## 2012-11-13 DIAGNOSIS — K5792 Diverticulitis of intestine, part unspecified, without perforation or abscess without bleeding: Secondary | ICD-10-CM

## 2012-11-13 LAB — CBC
HCT: 37 % (ref 36.0–46.0)
Hemoglobin: 12.1 g/dL (ref 12.0–15.0)
MCHC: 32.7 g/dL (ref 30.0–36.0)
MCV: 89.6 fl (ref 78.0–100.0)
Platelets: 459 10*3/uL — ABNORMAL HIGH (ref 150.0–400.0)
RBC: 4.14 Mil/uL (ref 3.87–5.11)
RDW: 15.3 % — ABNORMAL HIGH (ref 11.5–14.6)
WBC: 6.5 10*3/uL (ref 4.5–10.5)

## 2012-11-13 LAB — RENAL FUNCTION PANEL
Albumin: 3.7 g/dL (ref 3.5–5.2)
BUN: 12 mg/dL (ref 6–23)
CO2: 27 mEq/L (ref 19–32)
Calcium: 8.5 mg/dL (ref 8.4–10.5)
Chloride: 95 mEq/L — ABNORMAL LOW (ref 96–112)
Creatinine, Ser: 0.8 mg/dL (ref 0.4–1.2)
GFR: 75.76 mL/min (ref >60.00)
Glucose, Bld: 110 mg/dL — ABNORMAL HIGH (ref 70–99)
Phosphorus: 3.4 mg/dL (ref 2.3–4.6)
Potassium: 3.4 mEq/L — ABNORMAL LOW (ref 3.5–5.1)
Sodium: 132 mEq/L — ABNORMAL LOW (ref 135–145)

## 2012-11-13 MED ORDER — HYDROCODONE-ACETAMINOPHEN 7.5-325 MG PO TABS: 1.0000 | ORAL_TABLET | Freq: Three times a day (TID) | ORAL | Status: DC | PRN

## 2012-11-13 NOTE — Assessment & Plan Note (Signed)
Well controlled today, no changes 

## 2012-11-13 NOTE — Progress Notes (Signed)
Patient ID: BERNETHA DREYFUSS, female   DOB: May 15, 1946, 66 y.o.   MRN: [id number] ALEXX BARMANN [id number] 12/06/1946 11/13/2012      Progress Note-Follow Up  Subjective  Chief Complaint  Chief Complaint  Patient presents with  . Follow-up    hospital follow up    HPI  Patient is a 66 year old Caucasian female who is in today for followup appointment. At her new patient appointment she was complaining of constipation, abdominal pain and fevers and ultimately was lab work and a CAT scan it was discovered she had a ruptured diverticulitis with abscess formation. She's been in and out of the hospital or since. He started on Cipro and Flagyl and in surgery placed a drain. She is following her family this week. Unfortunately she's developed a fistula as well. Is pleased that she is moving her bowels or her bladder better at this time. No high-grade fevers and chills but she doesn't know if still having frequent hot flashes especially with minimal exertion and after eating. She struggles with nausea if morning. Notes for the first 2 hours she is up she has no appetite and a couple hours and slowly returned. No vomiting. No chills. No chest pain, palpitations, shortness of breath. No bloody or tarry stool. Bowels are moving comfortably at this time. She notes she has a drain in place and finds that uncomfortable at times  Past Medical History  Diagnosis Date  . Allergic rhinitis   . HTN (hypertension)   . OSA (obstructive sleep apnea)   . GERD (gastroesophageal reflux disease)   . History of thyroid cancer   . Hyperlipidemia   . Chicken pox as a child  . Measles as a child  . Cancer 1980's    thyroid  . Insomnia   . Thyroid disease   . Constipation   . Diverticulitis 10/25/2012  . Abnormal cervical cytology 10/25/2012    Follows with Dr Maggie Font of Gyn    Past Surgical History  Procedure Date  . Thyroidectomy, partial 1988  . Tonsillectomy as a kid  . Total abdominal hysterectomy  70's  . Cholecystectomy 1990    Family History  Problem Relation Age of Onset  . Heart disease Father   . Pneumonia Father   . Hypertension Father   . Hyperlipidemia Father   . Cancer Father     skin  . Stroke Father   . Cancer Paternal Aunt   . Alzheimer's disease Mother   . Heart disease Mother   . Emphysema Brother     marijuana and cigarettes  . Alcohol abuse Brother   . Hearing loss Brother   . Diabetes Maternal Grandmother   . Alzheimer's disease Paternal Grandmother   . Cancer Paternal Grandmother     lung?- smoker  . Hyperlipidemia Paternal Grandmother   . Heart attack Paternal Grandfather   . Alcohol abuse Paternal Grandfather     History   Social History  . Marital Status: Married    Spouse Name: N/A    Number of Children: N/A  . Years of Education: N/A   Occupational History  . Not on file.   Social History Main Topics  . Smoking status: Never Smoker   . Smokeless tobacco: Never Used  . Alcohol Use: Yes     Comment: occasional  . Drug Use: No  . Sexually Active: Not Currently   Other Topics Concern  . Not on file   Social History Narrative   Married children  Current Outpatient Prescriptions on File Prior to Visit  Medication Sig Dispense Refill  . aspirin EC 81 MG tablet Take 81 mg by mouth daily.      Boris Lown Oil 300 MG CAPS Take 300 mg by mouth daily.      Marland Kitchen levothyroxine (SYNTHROID, LEVOTHROID) 150 MCG tablet Take 150-225 mcg by mouth daily. 1 tablet (150 mcg) every day except Sunday, 1 1/2 tablets (225 mcg) on Sunday      . metoCLOPramide (REGLAN) 10 MG tablet Take 10 mg by mouth at bedtime.       . nitroGLYCERIN (NITROSTAT) 0.4 MG SL tablet Place 0.4 mg under the tongue every 5 (five) minutes as needed. For chest pain      . omeprazole (PRILOSEC OTC) 20 MG tablet Take 20 mg by mouth 2 (two) times daily as needed. For acid reflux      . promethazine (PHENERGAN) 25 MG tablet Take 1 tablet (25 mg total) by mouth every 8 (eight) hours as  needed for nausea.  20 tablet  0  . psyllium (METAMUCIL) 58.6 % powder Take 1 packet by mouth at bedtime.      . simvastatin (ZOCOR) 40 MG tablet Take 40 mg by mouth at bedtime.       . traMADol (ULTRAM) 50 MG tablet Take 50 mg by mouth at bedtime.      . valsartan-hydrochlorothiazide (DIOVAN-HCT) 320-25 MG per tablet Take 1 tablet by mouth daily.  90 tablet  3  . vitamin B-12 (CYANOCOBALAMIN) 1000 MCG tablet Take 1,000 mcg by mouth daily.      Marland Kitchen etodolac (LODINE) 400 MG tablet Take 400 mg by mouth 2 (two) times daily.         Allergies  Allergen Reactions  . Niacin Cough  . Neomycin-Bacitracin Zn-Polymyx Rash    Review of Systems  Review of Systems  Constitutional: Negative for fever and malaise/fatigue.  HENT: Negative for congestion.   Eyes: Negative for discharge.  Respiratory: Negative for shortness of breath.   Cardiovascular: Negative for chest pain, palpitations and leg swelling.  Gastrointestinal: Positive for heartburn, nausea and abdominal pain. Negative for diarrhea, constipation, blood in stool and melena.  Genitourinary: Negative for dysuria.  Musculoskeletal: Negative for falls.  Skin: Negative for rash.  Neurological: Negative for loss of consciousness and headaches.  Endo/Heme/Allergies: Negative for polydipsia.  Psychiatric/Behavioral: Negative for depression and suicidal ideas. The patient is not nervous/anxious and does not have insomnia.     Objective  BP 110/73  Pulse 87  Temp 98.4 F (36.9 C) (Temporal)  Ht 5\' 2"  (1.575 m)  Wt 197 lb 12.8 oz (89.721 kg)  BMI 36.18 kg/m2  SpO2 96%  Physical Exam  Physical Exam  Constitutional: She is oriented to person, place, and time and well-developed, well-nourished, and in no distress. No distress.  HENT:  Head: Normocephalic and atraumatic.  Eyes: Conjunctivae normal are normal.  Neck: Neck supple. No thyromegaly present.  Cardiovascular: Normal rate, regular rhythm and normal heart sounds.   No murmur  heard. Pulmonary/Chest: Effort normal and breath sounds normal. She has no wheezes.  Abdominal: Soft. Bowel sounds are normal. She exhibits no distension and no mass. There is no rebound and no guarding.  Musculoskeletal: She exhibits no edema.  Lymphadenopathy:    She has no cervical adenopathy.  Neurological: She is alert and oriented to person, place, and time.  Skin: Skin is warm and dry. No rash noted. She is not diaphoretic.  Psychiatric: Memory, affect and  judgment normal.    Lab Results  Component Value Date   TSH 0.299* 07/18/2011   Lab Results  Component Value Date   WBC 13.1* 11/05/2012   HGB 11.0* 11/05/2012   HCT 33.1* 11/05/2012   MCV 86.9 11/05/2012   PLT 426* 11/05/2012   Lab Results  Component Value Date   CREATININE 0.85 11/05/2012   BUN 5* 11/05/2012   NA 137 11/05/2012   K 3.3* 11/05/2012   CL 97 11/05/2012   CO2 28 11/05/2012   Lab Results  Component Value Date   ALT 36* 11/05/2012   AST 26 11/05/2012   ALKPHOS 126* 11/05/2012   BILITOT 0.2* 11/05/2012   Lab Results  Component Value Date   CHOL 150 07/19/2011   Lab Results  Component Value Date   HDL 52 07/19/2011   Lab Results  Component Value Date   LDLCALC 73 07/19/2011   Lab Results  Component Value Date   TRIG 123 07/19/2011   Lab Results  Component Value Date   CHOLHDL 2.9 07/19/2011     Assessment & Plan  Diverticulitis Continues on ciprofloxacin and Flagyl. Has a drain in her abdomen still but has an appt to follow up with surgery this week to discuss her surgical options. She had a ruptured diverticulitis and fistulas develop secondary to her infection.  Her pain is still considerable and she complains of significant nausea worst in am. She is advised to try ginger capsules qid prn and may call for a trial of Ondansetron as needed.   [facility] Is going to put off GI and upper endoscopy until she is feeling better.  HYPERTENSION Well controlled today, no changes  Constipation Has  improved as the abscess has been resolving.

## 2012-11-13 NOTE — Assessment & Plan Note (Signed)
Continues on ciprofloxacin and Flagyl. Has a drain in her abdomen still but has an appt to follow up with surgery this week to discuss her surgical options. She had a ruptured diverticulitis and fistulas develop secondary to her infection.  Her pain is still considerable and she complains of significant nausea worst in am. She is advised to try ginger capsules qid prn and may call for a trial of Ondansetron as needed.

## 2012-11-13 NOTE — Assessment & Plan Note (Signed)
Is going to put off GI and upper endoscopy until she is feeling better.

## 2012-11-13 NOTE — Assessment & Plan Note (Signed)
Has improved as the abscess has been resolving.

## 2012-11-14 ENCOUNTER — Ambulatory Visit (INDEPENDENT_AMBULATORY_CARE_PROVIDER_SITE_OTHER): Payer: Medicare Other | Admitting: General Surgery

## 2012-11-14 ENCOUNTER — Encounter (INDEPENDENT_AMBULATORY_CARE_PROVIDER_SITE_OTHER): Payer: Self-pay | Admitting: General Surgery

## 2012-11-14 VITALS — BP 126/60 | HR 102 | Temp 98.3°F | Ht 62.0 in | Wt 197.2 lb

## 2012-11-14 DIAGNOSIS — K5732 Diverticulitis of large intestine without perforation or abscess without bleeding: Secondary | ICD-10-CM

## 2012-11-14 DIAGNOSIS — N824 Other female intestinal-genital tract fistulae: Secondary | ICD-10-CM

## 2012-11-14 DIAGNOSIS — K5792 Diverticulitis of intestine, part unspecified, without perforation or abscess without bleeding: Secondary | ICD-10-CM

## 2012-11-14 NOTE — Progress Notes (Signed)
Quick Note:  Patient Informed and voiced understanding ______ 

## 2012-11-14 NOTE — Progress Notes (Signed)
  Subjective: Pt is now having "flush" material coming out of her vagina.  She is also having stool drain from her vagina as well.  Otherwise, she is doing well except for pain at her drain site which is exhausting.  Objective: PE: Abd: soft, non-tender except at her drain.  Drain with minimal air and feculent material present.  Obese, ND  Lab Results:   Basename 11/13/12 1406  WBC 6.5  HGB 12.1  HCT 37.0  PLT 459.0*   BMET  Basename 11/13/12 1406  NA 132*  K 3.4*  CL 95*  CO2 27  GLUCOSE 110*  BUN 12  CREATININE 0.8  CALCIUM 8.5   PT/INR No results found for this basename: LABPROT:2,INR:2 in the last 72 hours CMP     Component Value Date/Time   NA 132* 11/13/2012 1406   K 3.4* 11/13/2012 1406   CL 95* 11/13/2012 1406   CO2 27 11/13/2012 1406   GLUCOSE 110* 11/13/2012 1406   BUN 12 11/13/2012 1406   CREATININE 0.8 11/13/2012 1406   CALCIUM 8.5 11/13/2012 1406   PROT 6.3 11/05/2012 1823   ALBUMIN 3.7 11/13/2012 1406   AST 26 11/05/2012 1823   ALT 36* 11/05/2012 1823   ALKPHOS 126* 11/05/2012 1823   BILITOT 0.2* 11/05/2012 1823   GFRNONAA 69* 11/05/2012 1823   GFRAA 80* 11/05/2012 1823   Lipase  No results found for this basename: lipase       Studies/Results: No results found.  Anti-infectives: Anti-infectives    None       Assessment/Plan  1. 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 Patient is doing well for now.  She has a follow up appointment with Dr. Lindie Spruce next Tuesday.  Everything is to remain the same for now.  She should call with any new complaints or issues in the meantime.     Winston Sobczyk E 11/14/2012, 3:59 PM Pager: [phone]

## 2012-11-16 ENCOUNTER — Ambulatory Visit: Payer: Medicare Other | Admitting: Gastroenterology

## 2012-11-17 ENCOUNTER — Telehealth (INDEPENDENT_AMBULATORY_CARE_PROVIDER_SITE_OTHER): Payer: Self-pay

## 2012-11-17 NOTE — Telephone Encounter (Signed)
Pt called stating when her son emptied her drain today she had a drop of blood in drain. No continued bleeding. Pt states it was a very small amount and she just wanted Dr Lindie Spruce to be aware. No bleeding from rectum or vaginal area. Pt states she is doing well with no fever, abd pain or bloating. Pt advised to call if bleeding starts again or if she has symptoms of abd pain or bloating.

## 2012-11-21 ENCOUNTER — Encounter (INDEPENDENT_AMBULATORY_CARE_PROVIDER_SITE_OTHER): Payer: Self-pay | Admitting: General Surgery

## 2012-11-21 ENCOUNTER — Ambulatory Visit (INDEPENDENT_AMBULATORY_CARE_PROVIDER_SITE_OTHER): Payer: Medicare Other | Admitting: General Surgery

## 2012-11-21 VITALS — BP 114/60 | HR 75 | Temp 97.9°F | Ht 62.0 in | Wt 198.2 lb

## 2012-11-21 DIAGNOSIS — K5720 Diverticulitis of large intestine with perforation and abscess without bleeding: Secondary | ICD-10-CM

## 2012-11-21 DIAGNOSIS — K630 Abscess of intestine: Secondary | ICD-10-CM

## 2012-11-21 HISTORY — DX: Diverticulitis of large intestine with perforation and abscess without bleeding: K57.20

## 2012-11-21 NOTE — Progress Notes (Signed)
The patient is doing well and is virtually pain free. She continues to have some mild discomfort in her pelvic area when she stands for a long time. She continues to have some vaginal drainage of the same type of material that is coming from her posterior transgluteal pelvic drain for a diverticular abscess.  She's had no fevers or chills. She is eating well. She is passing gas and having normal bowel movements. The only real complaint that she has is that she does have some transvaginal drainage which is of the same quality as her percutaneous drain.  The amount of drainage from her percutaneous drain over the last 5 days his bed approximately 50 cc with the most coming out yesterday where she had 25 cc.  On examination today the posterior drain is intact. She continues to have some lower abdominal discomfort to deep palpation especially in the left lower quadrant. She has an abdominal wall bruising from her previous subcutaneous injection of Lovenox.  In order to move forward I think that we should get a repeat CT scan of her abdomen and pelvis with contrast to assess resolution of her peri-diverticular abscess. At that point we'll go ahead and plan for surgery. I do think that the patient would benefit from a preoperative stent in her ureters. I will see her back in clinic in one to 2 weeks for discussion of future surgery. This will be done with a bowel prep.

## 2012-11-22 ENCOUNTER — Ambulatory Visit (HOSPITAL_COMMUNITY)
Admission: RE | Admit: 2012-11-22 | Discharge: 2012-11-22 | Disposition: A | Discharge status: Home / Self Care | Payer: Medicare Other | Source: Ambulatory Visit | Attending: General Surgery | Admitting: General Surgery

## 2012-11-22 ENCOUNTER — Other Ambulatory Visit: Payer: Medicare Other

## 2012-11-22 DIAGNOSIS — K630 Abscess of intestine: Secondary | ICD-10-CM | POA: Insufficient documentation

## 2012-11-22 DIAGNOSIS — K5720 Diverticulitis of large intestine with perforation and abscess without bleeding: Secondary | ICD-10-CM

## 2012-11-22 MED ORDER — IOHEXOL 300 MG/ML  SOLN
80.0000 mL | Freq: Once | INTRAMUSCULAR | Status: AC | PRN
  Administered 2012-11-22: 80 mL via INTRAVENOUS

## 2012-11-23 ENCOUNTER — Other Ambulatory Visit (INDEPENDENT_AMBULATORY_CARE_PROVIDER_SITE_OTHER): Payer: Medicare Other

## 2012-11-23 ENCOUNTER — Telehealth: Payer: Self-pay | Admitting: Family Medicine

## 2012-11-23 DIAGNOSIS — I10 Essential (primary) hypertension: Secondary | ICD-10-CM

## 2012-11-23 LAB — RENAL FUNCTION PANEL
Albumin: 3.8 g/dL (ref 3.5–5.2)
BUN: 11 mg/dL (ref 6–23)
CO2: 30 mEq/L (ref 19–32)
Calcium: 8.6 mg/dL (ref 8.4–10.5)
Chloride: 93 mEq/L — ABNORMAL LOW (ref 96–112)
Creatinine, Ser: 0.8 mg/dL (ref 0.4–1.2)
GFR: 79.17 mL/min (ref >60.00)
Glucose, Bld: 124 mg/dL — ABNORMAL HIGH (ref 70–99)
Phosphorus: 3.6 mg/dL (ref 2.3–4.6)
Potassium: 3.4 mEq/L — ABNORMAL LOW (ref 3.5–5.1)
Sodium: 130 mEq/L — ABNORMAL LOW (ref 135–145)

## 2012-11-23 NOTE — Telephone Encounter (Signed)
As long as she is not in pain she just needs Nystatin liquid 5 cc swish and spit qid, disp #1 bottle

## 2012-11-23 NOTE — Telephone Encounter (Signed)
Please advise? Pt aware that MD is out of the office until tomorrow morning.

## 2012-11-24 MED ORDER — NYSTATIN 100000 UNIT/ML MT SUSP: 500000.0000 [IU] | Freq: Four times a day (QID) | OROMUCOSAL | Status: DC

## 2012-11-24 NOTE — Telephone Encounter (Signed)
Left pt a detailed voicemail. Sent RX to pharmacy

## 2012-11-28 ENCOUNTER — Encounter (INDEPENDENT_AMBULATORY_CARE_PROVIDER_SITE_OTHER): Payer: Self-pay | Admitting: General Surgery

## 2012-11-28 ENCOUNTER — Ambulatory Visit (INDEPENDENT_AMBULATORY_CARE_PROVIDER_SITE_OTHER): Payer: Medicare Other | Admitting: General Surgery

## 2012-11-28 VITALS — BP 142/84 | HR 76 | Temp 97.8°F | Resp 16 | Ht 62.0 in | Wt 198.8 lb

## 2012-11-28 DIAGNOSIS — K5732 Diverticulitis of large intestine without perforation or abscess without bleeding: Secondary | ICD-10-CM

## 2012-11-28 HISTORY — DX: Diverticulitis of large intestine without perforation or abscess without bleeding: K57.32

## 2012-11-28 NOTE — Progress Notes (Signed)
I have reviewed the patient's recent CT scan. He demonstrates almost complete resolution of her pelvic abscess.   She has extensive inflammation in the diverticular disease in her mid to distal sigmoid colon. Because she is still draining and carried contents into her percutaneous drain I'm reluctant to remove her drain prior to surgery. We will go ahead and plan for colectomy with anastomosis of the causes may be in a significantly inflamed area and it may be a fairly low anastomosis I discussed with her the possibility of placing a protective loop ileostomy.  The risks and benefits have been explained we will go ahead and schedule the surgery. She will need preoperative urinary ureteral stents placed. She will get a two-day bowel prep with oral antibiotics.

## 2012-11-29 ENCOUNTER — Ambulatory Visit (INDEPENDENT_AMBULATORY_CARE_PROVIDER_SITE_OTHER): Payer: Medicare Other | Admitting: Family Medicine

## 2012-11-29 ENCOUNTER — Encounter: Payer: Self-pay | Admitting: Family Medicine

## 2012-11-29 VITALS — BP 150/66 | HR 85 | Temp 98.7°F | Ht 62.0 in | Wt 197.0 lb

## 2012-11-29 DIAGNOSIS — B370 Candidal stomatitis: Secondary | ICD-10-CM

## 2012-11-29 MED ORDER — NYSTATIN 100000 UNIT/ML MT SUSP: OROMUCOSAL | Status: DC

## 2012-11-29 NOTE — Progress Notes (Signed)
OFFICE NOTE  11/29/2012  CC:  Chief Complaint  Patient presents with  . Thrush    finished nystatin rinse given by Dr.Blyth     HPI: Patient is a 66 y.o. Caucasian female who is here for suspected thrush. She has recently had cipro/flagyl for ruptured diverticulitis w/abscess.  Plan for colectomy 12/20/11. On 11/13/12 our office called her in some nystatin suspension due to her c/o brownish film on tongue with "yucky" feeling and taste, but apparently not an adequate amount b/c she is already out of this med, says tongue is partially improved.  Has yucky feeling on tongue.  Not currently on abx. Her abdomen feels okay at this time.  Part of her drainage is coming out the peritoneal drain and some is coming out the vagina.   She had f/u with her gen surgeon yesterday.  Pertinent PMH:  Past Medical History  Diagnosis Date  . Allergic rhinitis   . HTN (hypertension)   . OSA (obstructive sleep apnea)   . GERD (gastroesophageal reflux disease)   . History of thyroid cancer   . Hyperlipidemia   . Chicken pox as a child  . Measles as a child  . Cancer 1980's    thyroid  . Insomnia   . Thyroid disease   . Constipation   . Diverticulitis 10/25/2012  . Abnormal cervical cytology 10/25/2012    Follows with Dr Maggie Font of Gyn  . Arthritis   . Asthma   . Heart murmur     MEDS:  Outpatient Prescriptions Prior to Visit  Medication Sig Dispense Refill  . aspirin EC 81 MG tablet Take 81 mg by mouth daily.      Marland Kitchen etodolac (LODINE) 400 MG tablet Take 400 mg by mouth 2 (two) times daily.       Boris Lown Oil 300 MG CAPS Take 300 mg by mouth daily.      Marland Kitchen levothyroxine (SYNTHROID, LEVOTHROID) 150 MCG tablet Take 150-225 mcg by mouth daily. 1 tablet (150 mcg) every day except Sunday, 1 1/2 tablets (225 mcg) on Sunday      . nitroGLYCERIN (NITROSTAT) 0.4 MG SL tablet Place 0.4 mg under the tongue every 5 (five) minutes as needed. For chest pain      . omeprazole (PRILOSEC OTC) 20 MG tablet  Take 20 mg by mouth 2 (two) times daily as needed. For acid reflux      . psyllium (METAMUCIL) 58.6 % powder Take 1 packet by mouth at bedtime.      . simvastatin (ZOCOR) 40 MG tablet Take 40 mg by mouth at bedtime.       . traMADol (ULTRAM) 50 MG tablet Take 50 mg by mouth at bedtime.      . valsartan-hydrochlorothiazide (DIOVAN-HCT) 320-25 MG per tablet Take 1 tablet by mouth daily.  90 tablet  3  . vitamin B-12 (CYANOCOBALAMIN) 1000 MCG tablet Take 1,000 mcg by mouth daily.      . metoCLOPramide (REGLAN) 10 MG tablet Take 10 mg by mouth at bedtime.       . [DISCONTINUED] nystatin (MYCOSTATIN) 100000 UNIT/ML suspension Take 5 mLs (500,000 Units total) by mouth 4 (four) times daily.  60 mL  0   Last reviewed on 11/29/2012  3:35 PM by Jeoffrey Massed, MD  PE: Blood pressure 150/66, pulse 85, temperature 98.7 F (37.1 C), temperature source Temporal, height 5\' 2"  (1.575 m), weight 197 lb (89.359 kg). Gen: Alert, well appearing.  Patient is oriented to person, place,  time, and situation. AFFECT: pleasant, lucid thought and speech. TONGUE: thin film of greyish substance adherent to top of entire tongue.  Gingiva, buccal mucosa, palate, and posterior pharynx were without any lesion or involvement.  IMPRESSION AND PLAN:  Thrush, oral Discussed options: continue with longer/adequate course of nystatin swish and spit vs diflucan 150mg  qd x 3-5d. She chose to avoid the small possibility of liver problem related to diflucan use, so we decided to do a 14d course of nystatin susp, 5ml swish, gargle, and spit x 14d, RF x 1.   An After Visit Summary was printed and given to the patient.  FOLLOW UP: prn

## 2012-11-29 NOTE — Assessment & Plan Note (Signed)
Discussed options: continue with longer/adequate course of nystatin swish and spit vs diflucan 150mg  qd x 3-5d. She chose to avoid the small possibility of liver problem related to diflucan use, so we decided to do a 14d course of nystatin susp, 5ml swish, gargle, and spit x 14d, RF x 1.

## 2012-11-30 ENCOUNTER — Ambulatory Visit: Payer: Medicare Other | Admitting: Gastroenterology

## 2012-11-30 ENCOUNTER — Other Ambulatory Visit: Payer: Self-pay | Admitting: Urology

## 2012-12-07 ENCOUNTER — Encounter (HOSPITAL_COMMUNITY): Payer: Self-pay | Admitting: Pharmacy Technician

## 2012-12-07 ENCOUNTER — Inpatient Hospital Stay (HOSPITAL_COMMUNITY): Admission: RE | Admit: 2012-12-07 | Payer: Medicare Other | Source: Ambulatory Visit

## 2012-12-08 ENCOUNTER — Encounter (HOSPITAL_COMMUNITY): Payer: Self-pay

## 2012-12-08 ENCOUNTER — Telehealth (INDEPENDENT_AMBULATORY_CARE_PROVIDER_SITE_OTHER): Payer: Self-pay | Admitting: General Surgery

## 2012-12-08 ENCOUNTER — Encounter (HOSPITAL_COMMUNITY)
Admission: RE | Admit: 2012-12-08 | Discharge: 2012-12-08 | Disposition: A | Discharge status: Home / Self Care | Payer: Medicare Other | Source: Ambulatory Visit | Attending: General Surgery | Admitting: General Surgery

## 2012-12-08 ENCOUNTER — Ambulatory Visit (HOSPITAL_COMMUNITY)
Admission: RE | Admit: 2012-12-08 | Discharge: 2012-12-08 | Disposition: A | Discharge status: Home / Self Care | Payer: Medicare Other | Source: Ambulatory Visit | Attending: General Surgery | Admitting: General Surgery

## 2012-12-08 DIAGNOSIS — Z01811 Encounter for preprocedural respiratory examination: Secondary | ICD-10-CM | POA: Insufficient documentation

## 2012-12-08 DIAGNOSIS — K630 Abscess of intestine: Secondary | ICD-10-CM | POA: Insufficient documentation

## 2012-12-08 LAB — CBC WITH DIFFERENTIAL/PLATELET
Basophils Absolute: 0 10*3/uL (ref 0.0–0.1)
Basophils Relative: 0 % (ref 0–1)
Eosinophils Absolute: 0.1 10*3/uL (ref 0.0–0.7)
Eosinophils Relative: 2 % (ref 0–5)
HCT: 35 % — ABNORMAL LOW (ref 36.0–46.0)
Hemoglobin: 11.8 g/dL — ABNORMAL LOW (ref 12.0–15.0)
Lymphocytes Relative: 24 % (ref 12–46)
Lymphs Abs: 2 10*3/uL (ref 0.7–4.0)
MCH: 29.6 pg (ref 26.0–34.0)
MCHC: 33.7 g/dL (ref 30.0–36.0)
MCV: 87.7 fL (ref 78.0–100.0)
Monocytes Absolute: 0.5 10*3/uL (ref 0.1–1.0)
Monocytes Relative: 6 % (ref 3–12)
Neutro Abs: 5.6 10*3/uL (ref 1.7–7.7)
Neutrophils Relative %: 68 % (ref 43–77)
Platelets: 324 10*3/uL (ref 150–400)
RBC: 3.99 MIL/uL (ref 3.87–5.11)
RDW: 14.4 % (ref 11.5–15.5)
WBC: 8.2 10*3/uL (ref 4.0–10.5)

## 2012-12-08 LAB — SURGICAL PCR SCREEN
MRSA, PCR: NEGATIVE
Staphylococcus aureus: NEGATIVE

## 2012-12-08 LAB — COMPREHENSIVE METABOLIC PANEL
ALT: 17 U/L (ref 0–35)
AST: 17 U/L (ref 0–37)
Albumin: 3.8 g/dL (ref 3.5–5.2)
Alkaline Phosphatase: 82 U/L (ref 39–117)
BUN: 14 mg/dL (ref 6–23)
CO2: 30 mEq/L (ref 19–32)
Calcium: 9.5 mg/dL (ref 8.4–10.5)
Chloride: 95 mEq/L — ABNORMAL LOW (ref 96–112)
Creatinine, Ser: 0.67 mg/dL (ref 0.50–1.10)
GFR calc Af Amer: >90 mL/min (ref >90)
GFR calc non Af Amer: 88 mL/min — ABNORMAL LOW (ref >90)
Glucose, Bld: 104 mg/dL — ABNORMAL HIGH (ref 70–99)
Potassium: 3.9 mEq/L (ref 3.5–5.1)
Sodium: 136 mEq/L (ref 135–145)
Total Bilirubin: 0.3 mg/dL (ref 0.3–1.2)
Total Protein: 7.4 g/dL (ref 6.0–8.3)

## 2012-12-08 LAB — TYPE AND SCREEN
ABO/RH(D): A POS
Antibody Screen: NEGATIVE

## 2012-12-08 LAB — ABO/RH: ABO/RH(D): A POS

## 2012-12-08 NOTE — Progress Notes (Signed)
Due to pt. Allergy to Neomycin/ bacitracin,  Spoke with Jackie Stevens in [facility]. - OK to give Rx for Mupirocin

## 2012-12-08 NOTE — Progress Notes (Signed)
Call to [facility] at [facility] referencing 2 procedures, one with Dr. Lindie Spruce & one with Dr. Brunilda Payor- ?same day? Message will be sent to Dr. Lindie Spruce fr. [facility] office to clarify

## 2012-12-08 NOTE — Pre-Procedure Instructions (Signed)
20 YARDEN MANUELITO  12/08/2012   Your procedure is scheduled on:  12/19/2012  Report to [facility] at 5:30 AM.  Call this number if you have problems the morning of surgery: [phone]   Remember:   Do not eat food or drink liquid After Midnight. On MONDAY     Take these medicines the morning of surgery with A SIP OF WATER: Synthroid, Nystatin, Prilosec,Diovan   Do not wear jewelry, make-up or nail polish.  Do not wear lotions, powders, or perfumes. You may wear deodorant.  Do not shave 48 hours prior to surgery.   Do not bring valuables to the hospital.  Contacts, dentures or bridgework may not be worn into surgery.  Leave suitcase in the car. After surgery it may be brought to your room.  For patients admitted to the hospital, checkout time is 11:00 AM the day of discharge.   Patients discharged the day of surgery will not be allowed to drive home.  Name and phone number of your driver: /w family  Special Instructions: Shower using CHG 2 nights before surgery and the night before surgery.  If you shower the day of surgery use CHG.  Use special wash - you have one bottle of CHG for all showers.  You should use approximately 1/3 of the bottle for each shower.   Please read over the following fact sheets that you were given: Pain Booklet, Coughing and Deep Breathing, MRSA Information and Surgical Site Infection Prevention

## 2012-12-08 NOTE — Telephone Encounter (Signed)
Beth with [facility] called to get clarification on orders in EPIC. She states there are orders for urinary stent placements but she did not know if Dr Lindie Spruce and Dr Brunilda Payor were doing a combination case. I advised according to Dr Dixon Boos office note patient was to have pre operative stent placements. Please clarify with Beth at [facility]. [phone].

## 2012-12-08 NOTE — Telephone Encounter (Signed)
You were correct Jackie Stevens, Dr Brunilda Payor is doing stent before Jackie Stevens does surgery. No combined case.

## 2012-12-12 NOTE — Consult Note (Signed)
Anesthesia Chart Review:  Patient is a 66 year old female scheduled for sigmoid colectomy with possible loop ileostomy (Dr. Lindie Spruce) on 12/19/12 for diverticular disease with history of pelvic abscess. She is scheduled to have a cystoscopy with bilateral ureteral stents (Dr. Brunilda Payor) pre-colectomy.  History includes non-smoker, post-operative N/V, GERD, HLD, asthma, thyroid cancer s/p partial thyroidectomy, hypothyroidism, HTN, renal stones, OSA with CPAP use (Dr. Maple Hudson), murmur (not specified), arthritis.  PCP is Dr. Danise Edge.  She was evaluated by cardiologist Dr. Eden Emms in 2012 for chest pain and had a negative stress test.  EKG 12/08/12 showed NSR, LAD, incomplete left BBB, minimal voltage criteria for LVH.  She has had several EKGs since 2009 (see Muse).  Overall, I do not think her EKG has significantly changed since at least 07/18/11.    Nuclear stress test on 07/27/11 was normal, EF 72%.  CXR on 12/08/12 showed no acute disease in the chest.  Preoperative labs noted.  She had a normal stress test less than two years ago.  No CV symptoms were documented at PAT.  If no significant change in her status then anticipate she can proceed as planned.  I reviewed above with Anesthesiologist Dr. Noreene Larsson who agrees with this plan.  Shonna Chock, PA-C 12/12/12  [phone]

## 2012-12-14 ENCOUNTER — Telehealth (INDEPENDENT_AMBULATORY_CARE_PROVIDER_SITE_OTHER): Payer: Self-pay

## 2012-12-14 NOTE — Telephone Encounter (Signed)
The pt called because she was prescribed Erythromycin as her preop bowel prep.  She read the literature from the pharmacy and it states it is not recommended for you to take it if you have low blood potassium.  She usually has a potassium level of 3.4.  She has been drinking Gatorade to get it up.  She wants to know if she should still take it.

## 2012-12-14 NOTE — Telephone Encounter (Addendum)
I called patient back to let her know Dr Lindie Spruce said it was okay to take the erythromycin and he does want her to take it. I called pt back and she did not answer. I left a message for her to call back

## 2012-12-15 NOTE — Telephone Encounter (Signed)
Pt returned call regarding the E-mycin.  Read her the note from Dr. Lindie Spruce to go ahead and take the medicine as ordered.. She understands and will comply.

## 2012-12-18 ENCOUNTER — Telehealth (INDEPENDENT_AMBULATORY_CARE_PROVIDER_SITE_OTHER): Payer: Self-pay | Admitting: General Surgery

## 2012-12-18 ENCOUNTER — Ambulatory Visit: Payer: Medicare Other | Admitting: Family Medicine

## 2012-12-18 MED ORDER — ALVIMOPAN 12 MG PO CAPS
12.0000 mg | ORAL_CAPSULE | Freq: Once | ORAL | Status: AC
  Administered 2012-12-19: 12 mg via ORAL
  Filled 2012-12-18 (×2): qty 1

## 2012-12-18 MED ORDER — DEXTROSE 5 % IV SOLN
2.0000 g | INTRAVENOUS | Status: AC
  Administered 2012-12-19: 2 g via INTRAVENOUS
  Filled 2012-12-18: qty 2

## 2012-12-18 NOTE — Telephone Encounter (Signed)
Pt was drinking magnesium citrate without difficulty, but after drinking another 2 glasses of water a few hours later, she had significant n/v.  Advised her to take antinausea med and cease drinking last night.  Advised her to drink more slowly this AM and to call if more n/v occurs.

## 2012-12-18 NOTE — Telephone Encounter (Signed)
Pt called to follow-up on vomiting after her mag citrate.  She has now drunk the fluid for a second time and begins to vomit with the drinking of water.  She is passing clear fluids now, so reassured her bowel prep is done.  Advised her to drink Gatorade now (instead of the water) to prevent dehydration and continue only clear liquids.  She understands and will comply.

## 2012-12-19 ENCOUNTER — Encounter (HOSPITAL_COMMUNITY): Payer: Self-pay | Admitting: General Practice

## 2012-12-19 ENCOUNTER — Ambulatory Visit (HOSPITAL_COMMUNITY): Payer: Medicare Other | Admitting: Vascular Surgery

## 2012-12-19 ENCOUNTER — Encounter (HOSPITAL_COMMUNITY): Payer: Self-pay | Admitting: Vascular Surgery

## 2012-12-19 ENCOUNTER — Inpatient Hospital Stay (HOSPITAL_COMMUNITY)
Admission: RE | Admit: 2012-12-19 | Discharge: 2012-12-25 | DRG: 330 | Disposition: A | Discharge status: Home / Self Care | Payer: Medicare Other | Source: Ambulatory Visit | Attending: General Surgery | Admitting: General Surgery

## 2012-12-19 ENCOUNTER — Encounter (HOSPITAL_COMMUNITY)
Admission: RE | Disposition: A | Discharge status: Home / Self Care | Payer: Self-pay | Source: Ambulatory Visit | Attending: General Surgery

## 2012-12-19 DIAGNOSIS — N289 Disorder of kidney and ureter, unspecified: Secondary | ICD-10-CM | POA: Diagnosis present

## 2012-12-19 DIAGNOSIS — B370 Candidal stomatitis: Secondary | ICD-10-CM | POA: Diagnosis not present

## 2012-12-19 DIAGNOSIS — K651 Peritoneal abscess: Secondary | ICD-10-CM

## 2012-12-19 DIAGNOSIS — Z9089 Acquired absence of other organs: Secondary | ICD-10-CM

## 2012-12-19 DIAGNOSIS — Z888 Allergy status to other drugs, medicaments and biological substances status: Secondary | ICD-10-CM

## 2012-12-19 DIAGNOSIS — K5732 Diverticulitis of large intestine without perforation or abscess without bleeding: Secondary | ICD-10-CM

## 2012-12-19 DIAGNOSIS — K560 Paralytic ileus: Secondary | ICD-10-CM | POA: Diagnosis not present

## 2012-12-19 DIAGNOSIS — Y836 Removal of other organ (partial) (total) as the cause of abnormal reaction of the patient, or of later complication, without mention of misadventure at the time of the procedure: Secondary | ICD-10-CM | POA: Diagnosis not present

## 2012-12-19 DIAGNOSIS — K929 Disease of digestive system, unspecified: Secondary | ICD-10-CM | POA: Diagnosis not present

## 2012-12-19 DIAGNOSIS — J309 Allergic rhinitis, unspecified: Secondary | ICD-10-CM | POA: Diagnosis present

## 2012-12-19 DIAGNOSIS — I10 Essential (primary) hypertension: Secondary | ICD-10-CM | POA: Diagnosis present

## 2012-12-19 DIAGNOSIS — G4733 Obstructive sleep apnea (adult) (pediatric): Secondary | ICD-10-CM | POA: Diagnosis present

## 2012-12-19 DIAGNOSIS — E890 Postprocedural hypothyroidism: Secondary | ICD-10-CM | POA: Diagnosis present

## 2012-12-19 DIAGNOSIS — Z8249 Family history of ischemic heart disease and other diseases of the circulatory system: Secondary | ICD-10-CM

## 2012-12-19 DIAGNOSIS — E785 Hyperlipidemia, unspecified: Secondary | ICD-10-CM | POA: Diagnosis present

## 2012-12-19 DIAGNOSIS — Z90710 Acquired absence of both cervix and uterus: Secondary | ICD-10-CM

## 2012-12-19 DIAGNOSIS — K630 Abscess of intestine: Secondary | ICD-10-CM | POA: Diagnosis present

## 2012-12-19 DIAGNOSIS — K219 Gastro-esophageal reflux disease without esophagitis: Secondary | ICD-10-CM | POA: Diagnosis present

## 2012-12-19 DIAGNOSIS — Z85850 Personal history of malignant neoplasm of thyroid: Secondary | ICD-10-CM

## 2012-12-19 HISTORY — DX: Residual hemorrhoidal skin tags: K64.4

## 2012-12-19 HISTORY — DX: Other specified postprocedural states: R11.2

## 2012-12-19 HISTORY — PX: CYSTOSCOPY WITH STENT PLACEMENT: SHX5790

## 2012-12-19 HISTORY — PX: COLOSTOMY REVISION: SHX5232

## 2012-12-19 HISTORY — PX: SIGMOID RESECTION / RECTOPEXY: SUR1294

## 2012-12-19 HISTORY — DX: Calculus of kidney: N20.0

## 2012-12-19 HISTORY — DX: Obstructive sleep apnea (adult) (pediatric): G47.33

## 2012-12-19 HISTORY — DX: Adverse effect of unspecified anesthetic, initial encounter: T41.45XA

## 2012-12-19 HISTORY — DX: Dependence on other enabling machines and devices: Z99.89

## 2012-12-19 HISTORY — DX: Hypothyroidism, unspecified: E03.9

## 2012-12-19 HISTORY — DX: Other specified postprocedural states: Z98.890

## 2012-12-19 HISTORY — DX: Malignant neoplasm of thyroid gland: C73

## 2012-12-19 HISTORY — DX: Personal history of other diseases of the digestive system: Z87.19

## 2012-12-19 SURGERY — COLECTOMY, SIGMOID, OPEN
Anesthesia: General | Site: Abdomen | Wound class: Clean Contaminated

## 2012-12-19 MED ORDER — DIPHENHYDRAMINE HCL 50 MG/ML IJ SOLN: 12.5000 mg | Freq: Four times a day (QID) | INTRAMUSCULAR | Status: DC | PRN

## 2012-12-19 MED ORDER — LIDOCAINE HCL 2 % EX GEL
CUTANEOUS | Status: AC
  Filled 2012-12-19: qty 20

## 2012-12-19 MED ORDER — SIMVASTATIN 40 MG PO TABS
40.0000 mg | ORAL_TABLET | Freq: Every day | ORAL | Status: DC
  Administered 2012-12-19 – 2012-12-24 (×6): 40 mg via ORAL
  Filled 2012-12-19 (×8): qty 1

## 2012-12-19 MED ORDER — HYDROMORPHONE 0.3 MG/ML IV SOLN
INTRAVENOUS | Status: AC
  Administered 2012-12-19: 0.999 mg
  Filled 2012-12-19: qty 25

## 2012-12-19 MED ORDER — IRBESARTAN 300 MG PO TABS
300.0000 mg | ORAL_TABLET | Freq: Every day | ORAL | Status: DC
  Administered 2012-12-20 – 2012-12-25 (×6): 300 mg via ORAL
  Filled 2012-12-19 (×6): qty 1

## 2012-12-19 MED ORDER — HYDROMORPHONE HCL PF 1 MG/ML IJ SOLN
0.2500 mg | INTRAMUSCULAR | Status: DC | PRN
  Administered 2012-12-19 (×4): 0.5 mg via INTRAVENOUS

## 2012-12-19 MED ORDER — KCL IN DEXTROSE-NACL 20-5-0.45 MEQ/L-%-% IV SOLN
INTRAVENOUS | Status: DC
  Administered 2012-12-19 (×2): via INTRAVENOUS
  Filled 2012-12-19 (×5): qty 1000

## 2012-12-19 MED ORDER — ALVIMOPAN 12 MG PO CAPS
12.0000 mg | ORAL_CAPSULE | Freq: Two times a day (BID) | ORAL | Status: DC
  Administered 2012-12-19 – 2012-12-25 (×11): 12 mg via ORAL
  Filled 2012-12-19 (×15): qty 1

## 2012-12-19 MED ORDER — GLYCOPYRROLATE 0.2 MG/ML IJ SOLN
INTRAMUSCULAR | Status: DC | PRN
  Administered 2012-12-19: .6 mg via INTRAVENOUS

## 2012-12-19 MED ORDER — ETODOLAC 400 MG PO TABS
400.0000 mg | ORAL_TABLET | Freq: Two times a day (BID) | ORAL | Status: DC
  Administered 2012-12-19 – 2012-12-25 (×12): 400 mg via ORAL
  Filled 2012-12-19 (×13): qty 1

## 2012-12-19 MED ORDER — NITROGLYCERIN 0.4 MG SL SUBL: 0.4000 mg | SUBLINGUAL_TABLET | SUBLINGUAL | Status: DC | PRN

## 2012-12-19 MED ORDER — POVIDONE-IODINE 10 % EX OINT
TOPICAL_OINTMENT | CUTANEOUS | Status: AC
  Filled 2012-12-19: qty 28.35

## 2012-12-19 MED ORDER — HYDROCHLOROTHIAZIDE 25 MG PO TABS
25.0000 mg | ORAL_TABLET | Freq: Every day | ORAL | Status: DC
  Administered 2012-12-20 – 2012-12-25 (×6): 25 mg via ORAL
  Filled 2012-12-19 (×6): qty 1

## 2012-12-19 MED ORDER — PROPOFOL 10 MG/ML IV BOLUS
INTRAVENOUS | Status: DC | PRN
  Administered 2012-12-19: 160 mg via INTRAVENOUS

## 2012-12-19 MED ORDER — HYDROCODONE-ACETAMINOPHEN 5-325 MG PO TABS
1.0000 | ORAL_TABLET | Freq: Four times a day (QID) | ORAL | Status: DC | PRN
  Administered 2012-12-20 – 2012-12-22 (×6): 1 via ORAL
  Filled 2012-12-19 (×6): qty 1

## 2012-12-19 MED ORDER — LIDOCAINE HCL (CARDIAC) 20 MG/ML IV SOLN
INTRAVENOUS | Status: DC | PRN
  Administered 2012-12-19: 100 mg via INTRAVENOUS

## 2012-12-19 MED ORDER — LIDOCAINE HCL 4 % MT SOLN
OROMUCOSAL | Status: DC | PRN
  Administered 2012-12-19: 4 mL via TOPICAL

## 2012-12-19 MED ORDER — LEVOTHYROXINE SODIUM 150 MCG PO TABS: 150.0000 ug | ORAL_TABLET | Freq: Every day | ORAL | Status: DC

## 2012-12-19 MED ORDER — DEXTROSE 5 % IV SOLN
1.0000 g | Freq: Four times a day (QID) | INTRAVENOUS | Status: AC
  Administered 2012-12-19 – 2012-12-20 (×3): 1 g via INTRAVENOUS
  Filled 2012-12-19 (×3): qty 1

## 2012-12-19 MED ORDER — NALOXONE HCL 0.4 MG/ML IJ SOLN: 0.4000 mg | INTRAMUSCULAR | Status: DC | PRN

## 2012-12-19 MED ORDER — MIDAZOLAM HCL 5 MG/5ML IJ SOLN
INTRAMUSCULAR | Status: DC | PRN
  Administered 2012-12-19: 1 mg via INTRAVENOUS

## 2012-12-19 MED ORDER — SCOPOLAMINE 1 MG/3DAYS TD PT72
MEDICATED_PATCH | TRANSDERMAL | Status: AC
  Filled 2012-12-19: qty 1

## 2012-12-19 MED ORDER — ONDANSETRON HCL 4 MG/2ML IJ SOLN
INTRAMUSCULAR | Status: DC | PRN
  Administered 2012-12-19 (×2): 4 mg via INTRAVENOUS

## 2012-12-19 MED ORDER — NEOSTIGMINE METHYLSULFATE 1 MG/ML IJ SOLN
INTRAMUSCULAR | Status: DC | PRN
  Administered 2012-12-19: 4 mg via INTRAVENOUS

## 2012-12-19 MED ORDER — LEVOTHYROXINE SODIUM 25 MCG PO TABS
225.0000 ug | ORAL_TABLET | ORAL | Status: DC
  Administered 2012-12-24: 225 ug via ORAL
  Filled 2012-12-19: qty 1

## 2012-12-19 MED ORDER — VALSARTAN-HYDROCHLOROTHIAZIDE 320-25 MG PO TABS: 1.0000 | ORAL_TABLET | Freq: Every day | ORAL | Status: DC

## 2012-12-19 MED ORDER — LEVOTHYROXINE SODIUM 150 MCG PO TABS
150.0000 ug | ORAL_TABLET | ORAL | Status: DC
  Administered 2012-12-20 – 2012-12-25 (×5): 150 ug via ORAL
  Filled 2012-12-19 (×6): qty 1

## 2012-12-19 MED ORDER — HYDROMORPHONE HCL PF 1 MG/ML IJ SOLN
INTRAMUSCULAR | Status: AC
  Filled 2012-12-19: qty 2

## 2012-12-19 MED ORDER — SCOPOLAMINE 1 MG/3DAYS TD PT72
MEDICATED_PATCH | TRANSDERMAL | Status: DC | PRN
  Administered 2012-12-19: 1.5 mg via TRANSDERMAL

## 2012-12-19 MED ORDER — OMEPRAZOLE MAGNESIUM 20 MG PO TBEC: 20.0000 mg | DELAYED_RELEASE_TABLET | Freq: Every day | ORAL | Status: DC

## 2012-12-19 MED ORDER — PHENYLEPHRINE HCL 10 MG/ML IJ SOLN
INTRAMUSCULAR | Status: DC | PRN
  Administered 2012-12-19 (×2): 40 ug via INTRAVENOUS

## 2012-12-19 MED ORDER — VECURONIUM BROMIDE 10 MG IV SOLR
INTRAVENOUS | Status: DC | PRN
  Administered 2012-12-19 (×2): 1 mg via INTRAVENOUS

## 2012-12-19 MED ORDER — ONDANSETRON HCL 4 MG/2ML IJ SOLN
4.0000 mg | Freq: Once | INTRAMUSCULAR | Status: DC | PRN
Start: 2012-12-19 — End: 2012-12-19

## 2012-12-19 MED ORDER — 0.9 % SODIUM CHLORIDE (POUR BTL) OPTIME
TOPICAL | Status: DC | PRN
  Administered 2012-12-19 (×4): 1000 mL

## 2012-12-19 MED ORDER — HYDROMORPHONE 0.3 MG/ML IV SOLN
INTRAVENOUS | Status: DC
  Administered 2012-12-19: 0.999 mg via INTRAVENOUS
  Administered 2012-12-19: 1.79 mg via INTRAVENOUS
  Administered 2012-12-19: 25 mL via INTRAVENOUS
  Administered 2012-12-20: 06:00:00 via INTRAVENOUS
  Administered 2012-12-20: 0.6 mg via INTRAVENOUS
  Filled 2012-12-19: qty 25

## 2012-12-19 MED ORDER — LIDOCAINE HCL 2 % EX GEL
CUTANEOUS | Status: DC | PRN
  Administered 2012-12-19: 1

## 2012-12-19 MED ORDER — FENTANYL CITRATE 0.05 MG/ML IJ SOLN
INTRAMUSCULAR | Status: DC | PRN
  Administered 2012-12-19 (×10): 50 ug via INTRAVENOUS

## 2012-12-19 MED ORDER — PANTOPRAZOLE SODIUM 40 MG PO TBEC
40.0000 mg | DELAYED_RELEASE_TABLET | Freq: Every day | ORAL | Status: DC
  Administered 2012-12-20 – 2012-12-25 (×6): 40 mg via ORAL
  Filled 2012-12-19 (×7): qty 1

## 2012-12-19 MED ORDER — IOHEXOL 300 MG/ML  SOLN
INTRAMUSCULAR | Status: DC | PRN
  Administered 2012-12-19: 50 mL

## 2012-12-19 MED ORDER — ROCURONIUM BROMIDE 100 MG/10ML IV SOLN
INTRAVENOUS | Status: DC | PRN
  Administered 2012-12-19: 50 mg via INTRAVENOUS

## 2012-12-19 MED ORDER — ONDANSETRON HCL 4 MG/2ML IJ SOLN: 4.0000 mg | Freq: Four times a day (QID) | INTRAMUSCULAR | Status: DC | PRN

## 2012-12-19 MED ORDER — LACTATED RINGERS IV SOLN
INTRAVENOUS | Status: DC | PRN
  Administered 2012-12-19 (×3): via INTRAVENOUS

## 2012-12-19 MED ORDER — ARTIFICIAL TEARS OP OINT
TOPICAL_OINTMENT | OPHTHALMIC | Status: DC | PRN
  Administered 2012-12-19: 1 via OPHTHALMIC

## 2012-12-19 MED ORDER — STERILE WATER FOR IRRIGATION IR SOLN
Status: DC | PRN
  Administered 2012-12-19: 3000 mL

## 2012-12-19 MED ORDER — ALBUMIN HUMAN 5 % IV SOLN
INTRAVENOUS | Status: DC | PRN
  Administered 2012-12-19: 09:00:00 via INTRAVENOUS

## 2012-12-19 MED ORDER — DIPHENHYDRAMINE HCL 12.5 MG/5ML PO ELIX
12.5000 mg | ORAL_SOLUTION | Freq: Four times a day (QID) | ORAL | Status: DC | PRN
  Filled 2012-12-19: qty 5

## 2012-12-19 MED ORDER — ENOXAPARIN SODIUM 40 MG/0.4ML ~~LOC~~ SOLN
40.0000 mg | SUBCUTANEOUS | Status: DC
  Administered 2012-12-20 – 2012-12-25 (×6): 40 mg via SUBCUTANEOUS
  Filled 2012-12-19 (×7): qty 0.4

## 2012-12-19 MED ORDER — DEXAMETHASONE SODIUM PHOSPHATE 4 MG/ML IJ SOLN
INTRAMUSCULAR | Status: DC | PRN
  Administered 2012-12-19: 4 mg via INTRAVENOUS

## 2012-12-19 MED ORDER — SODIUM CHLORIDE 0.9 % IJ SOLN: 9.0000 mL | INTRAMUSCULAR | Status: DC | PRN

## 2012-12-19 SURGICAL SUPPLY — 81 items
ADAPTER CATH URET PLST 4-6FR (CATHETERS) IMPLANT
ADAPTER GOLDBERG URETERAL (ADAPTER) ×1 IMPLANT
ADPR CATH 15X14FR FL DRN BG (ADAPTER) ×2
ADPR CATH URET STRL DISP 4-6FR (CATHETERS)
BAG DRAIN URO-CYSTO SKYTR STRL (DRAIN) ×3 IMPLANT
BAG DRN UROCATH (DRAIN) ×2
BLADE SURG ROTATE 9660 (MISCELLANEOUS) IMPLANT
CANISTER SUCT LVC 12 LTR MEDI- (MISCELLANEOUS) ×1 IMPLANT
CANISTER SUCTION 2500CC (MISCELLANEOUS) ×4 IMPLANT
CATH FOLEY 2WAY SLVR  5CC 18FR (CATHETERS) ×1
CATH FOLEY 2WAY SLVR 5CC 18FR (CATHETERS) IMPLANT
CATH INTERMIT  6FR 70CM (CATHETERS) ×2 IMPLANT
CATH URET 5FR 28IN CONE TIP (BALLOONS)
CATH URET 5FR 70CM CONE TIP (BALLOONS) IMPLANT
CHLORAPREP W/TINT 26ML (MISCELLANEOUS) ×3 IMPLANT
CLOTH BEACON ORANGE TIMEOUT ST (SAFETY) ×6 IMPLANT
COVER MAYO STAND STRL (DRAPES) ×3 IMPLANT
COVER SURGICAL LIGHT HANDLE (MISCELLANEOUS) ×3 IMPLANT
DRAPE CAMERA CLOSED 9X96 (DRAPES) ×1 IMPLANT
DRAPE LAPAROSCOPIC ABDOMINAL (DRAPES) ×3 IMPLANT
DRAPE PROXIMA HALF (DRAPES) IMPLANT
DRAPE UTILITY 15X26 W/TAPE STR (DRAPE) ×8 IMPLANT
DRAPE WARM FLUID 44X44 (DRAPE) ×3 IMPLANT
ELECT BLADE 4.0 EZ CLEAN MEGAD (MISCELLANEOUS) ×3
ELECT BLADE 6.5 EXT (BLADE) ×1 IMPLANT
ELECT CAUTERY BLADE 6.4 (BLADE) ×6 IMPLANT
ELECT REM PT RETURN 9FT ADLT (ELECTROSURGICAL) ×3
ELECTRODE BLDE 4.0 EZ CLN MEGD (MISCELLANEOUS) IMPLANT
ELECTRODE REM PT RTRN 9FT ADLT (ELECTROSURGICAL) ×2 IMPLANT
GLOVE BIO SURGEON STRL SZ7 (GLOVE) ×1 IMPLANT
GLOVE BIOGEL PI IND STRL 6.5 (GLOVE) IMPLANT
GLOVE BIOGEL PI IND STRL 7.0 (GLOVE) IMPLANT
GLOVE BIOGEL PI IND STRL 7.5 (GLOVE) IMPLANT
GLOVE BIOGEL PI IND STRL 8 (GLOVE) ×4 IMPLANT
GLOVE BIOGEL PI INDICATOR 6.5 (GLOVE) ×2
GLOVE BIOGEL PI INDICATOR 7.0 (GLOVE) ×3
GLOVE BIOGEL PI INDICATOR 7.5 (GLOVE) ×1
GLOVE BIOGEL PI INDICATOR 8 (GLOVE) ×6
GLOVE ECLIPSE 7.5 STRL STRAW (GLOVE) ×8 IMPLANT
GLOVE SS BIOGEL STRL SZ 7.5 (GLOVE) IMPLANT
GLOVE SUPERSENSE BIOGEL SZ 7.5 (GLOVE) ×4
GLOVE SURG SS PI 7.0 STRL IVOR (GLOVE) ×5 IMPLANT
GOWN PREVENTION PLUS XLARGE (GOWN DISPOSABLE) ×2 IMPLANT
GOWN STRL NON-REIN LRG LVL3 (GOWN DISPOSABLE) ×23 IMPLANT
GOWN STRL REIN XL XLG (GOWN DISPOSABLE) ×3 IMPLANT
GUIDEWIRE ANG ZIPWIRE 038X150 (WIRE) ×1 IMPLANT
GUIDEWIRE STR DUAL SENSOR (WIRE) IMPLANT
GUIDEWIRE TEFLON GW 038X150 (WIRE) ×2 IMPLANT
KIT BASIN OR (CUSTOM PROCEDURE TRAY) ×3 IMPLANT
KIT ROOM TURNOVER OR (KITS) ×3 IMPLANT
LEGGING LITHOTOMY PAIR STRL (DRAPES) ×1 IMPLANT
LIGASURE IMPACT 36 18CM CVD LR (INSTRUMENTS) ×1 IMPLANT
NS IRRIG 1000ML POUR BTL (IV SOLUTION) ×8 IMPLANT
PACK CYSTOSCOPY (CUSTOM PROCEDURE TRAY) ×3 IMPLANT
PACK GENERAL/GYN (CUSTOM PROCEDURE TRAY) ×3 IMPLANT
PAD ARMBOARD 7.5X6 YLW CONV (MISCELLANEOUS) ×3 IMPLANT
PAD SHARPS MAGNETIC DISPOSAL (MISCELLANEOUS) ×3 IMPLANT
SPECIMEN JAR X LARGE (MISCELLANEOUS) ×2 IMPLANT
SPONGE LAP 18X18 X RAY DECT (DISPOSABLE) ×2 IMPLANT
STAPLER CIRC CVD 29MM 37CM (STAPLE) ×1 IMPLANT
STAPLER CUT CVD 40MM BLUE (STAPLE) ×2 IMPLANT
STAPLER CUT RELOAD BLUE (STAPLE) ×1 IMPLANT
STAPLER VISISTAT 35W (STAPLE) ×3 IMPLANT
SUCTION POOLE TIP (SUCTIONS) ×3 IMPLANT
SURGILUBE 2OZ TUBE FLIPTOP (MISCELLANEOUS) ×2 IMPLANT
SUT PDS AB 1 TP1 96 (SUTURE) ×6 IMPLANT
SUT PROLENE 2 0 CT2 30 (SUTURE) ×1 IMPLANT
SUT PROLENE 2 0 KS (SUTURE) IMPLANT
SUT SILK 2 0 SH CR/8 (SUTURE) ×3 IMPLANT
SUT SILK 2 0 TIES 10X30 (SUTURE) ×3 IMPLANT
SUT SILK 3 0 SH CR/8 (SUTURE) ×3 IMPLANT
SUT SILK 3 0 TIES 10X30 (SUTURE) ×3 IMPLANT
SYR BULB IRRIGATION 50ML (SYRINGE) ×3 IMPLANT
TOWEL OR 17X24 6PK STRL BLUE (TOWEL DISPOSABLE) ×1 IMPLANT
TOWEL OR 17X26 10 PK STRL BLUE (TOWEL DISPOSABLE) ×6 IMPLANT
TRAY FOLEY CATH 14FRSI W/METER (CATHETERS) ×1 IMPLANT
TRAY PROCTOSCOPIC FIBER OPTIC (SET/KITS/TRAYS/PACK) ×2 IMPLANT
UNDERPAD 30X30 INCONTINENT (UNDERPADS AND DIAPERS) ×1 IMPLANT
WATER STERILE IRR 1000ML POUR (IV SOLUTION) ×1 IMPLANT
WATER STERILE IRR 3000ML UROMA (IV SOLUTION) ×1 IMPLANT
YANKAUER SUCT BULB TIP NO VENT (SUCTIONS) ×3 IMPLANT

## 2012-12-19 NOTE — Anesthesia Postprocedure Evaluation (Signed)
  Anesthesia Post-op Note  Patient: Altamese Dilling  Procedure(s) Performed: Procedure(s) (LRB) with comments: COLON RESECTION SIGMOID (N/A) CYSTOSCOPY WITH STENT PLACEMENT (N/A)  Patient Location: PACU  Anesthesia Type:General  Level of Consciousness: awake, oriented, sedated and patient cooperative  Airway and Oxygen Therapy: Patient Spontanous Breathing  Post-op Pain: moderate  Post-op Assessment: Post-op Vital signs reviewed, Patient's Cardiovascular Status Stable, Respiratory Function Stable, Patent Airway, No signs of Nausea or vomiting and Pain level controlled  Post-op Vital Signs: stable  Complications: No apparent anesthesia complications

## 2012-12-19 NOTE — H&P (Signed)
After a thorough review of the management options for the patient's condition the patient  elected to proceed with surgical therapy as noted above. We have discussed the potential benefits and risks of the procedure, side effects of the proposed treatment, the likelihood of the patient achieving the goals of the procedure, and any potential problems that might occur during the procedure or recuperation. Informed consent has been obtained.

## 2012-12-19 NOTE — Preoperative (Signed)
Beta Blockers   Reason not to administer Beta Blockers:Not Applicable 

## 2012-12-19 NOTE — H&P (Signed)
Jackie Stevens is an 67 y.o. female.  HPI: the patient has had several weeks of abdominal pain and discomfort which she attributed to IBS. Her gyn treated her for a UTI, but she worsened while on the antibiotics. Her PMD changed her antibiotic, but the patient continued to worsen. Abdominal films from yesterday failed to demonstrate any abnomality, however CT scan today demonstrated a large pelvic abscess associated with diverticulitis. Surgery was consulted.  Past Medical History   Diagnosis  Date   .  Allergic rhinitis    .  HTN (hypertension)    .  OSA (obstructive sleep apnea)    .  GERD (gastroesophageal reflux disease)    .  History of thyroid cancer    .  Hyperlipidemia    .  Chicken pox  as a child   .  Measles  as a child   .  Cancer  1980's     thyroid   .  Insomnia    .  Thyroid disease    .  Constipation    .  Diverticulitis  10/25/2012   .  Abnormal cervical cytology  10/25/2012     Follows with Dr Maggie Font of Gyn    Past Surgical History   Procedure  Date   .  Thyroidectomy, partial  1988   .  Tonsillectomy  as a kid   .  Total abdominal hysterectomy  70's   .  Cholecystectomy  1990    Family History   Problem  Relation  Age of Onset   .  Heart disease  Father    .  Pneumonia  Father    .  Hypertension  Father    .  Hyperlipidemia  Father    .  Cancer  Father       skin    .  Stroke  Father    .  Cancer  Paternal Aunt    .  Alzheimer's disease  Mother    .  Heart disease  Mother    .  Emphysema  Brother       marijuana and cigarettes    .  Alcohol abuse  Brother    .  Hearing loss  Brother    .  Diabetes  Maternal Grandmother    .  Alzheimer's disease  Paternal Grandmother    .  Cancer  Paternal Grandmother       lung?- smoker    .  Hyperlipidemia  Paternal Grandmother    .  Heart attack  Paternal Grandfather    .  Alcohol abuse  Paternal Grandfather    Social History: reports that she has never smoked. She has never used smokeless tobacco.  She reports that she drinks alcohol. She reports that she does not use illicit drugs.  Allergies:  Allergies   Allergen  Reactions   .  Niacin  Cough   .  Neomycin-Bacitracin Zn-Polymyx  Rash   Medications: I have reviewed the patient's current medications.  Prior to Admission:  (Not in a hospital admission)  Results for orders placed during the hospital encounter of 10/26/12 (from the past 48 hour(s))   URINALYSIS, ROUTINE W REFLEX MICROSCOPIC Status: Abnormal    Collection Time    10/26/12 7:43 PM   Component  Value  Range  Comment    Color, Urine  YELLOW  YELLOW     APPearance  CLEAR  CLEAR     Specific Gravity, Urine  1.036 (*)  1.005 -  1.030     pH  6.0  5.0 - 8.0     Glucose, UA  NEGATIVE  NEGATIVE mg/dL     Hgb urine dipstick  NEGATIVE  NEGATIVE     Bilirubin Urine  SMALL (*)  NEGATIVE     Ketones, ur  15 (*)  NEGATIVE mg/dL     Protein, ur  NEGATIVE  NEGATIVE mg/dL     Urobilinogen, UA  0.2  0.0 - 1.0 mg/dL     Nitrite  NEGATIVE  NEGATIVE     Leukocytes, UA  NEGATIVE  NEGATIVE  MICROSCOPIC NOT DONE ON URINES WITH NEGATIVE PROTEIN, BLOOD, LEUKOCYTES, NITRITE, OR GLUCOSE <1000 mg/dL.   CBC WITH DIFFERENTIAL Status: Abnormal    Collection Time    10/26/12 7:44 PM   Component  Value  Range  Comment    WBC  20.1 (*)  4.0 - 10.5 K/uL     RBC  3.66 (*)  3.87 - 5.11 MIL/uL     Hemoglobin  10.9 (*)  12.0 - 15.0 g/dL     HCT  40.9 (*)  81.1 - 46.0 %     MCV  85.0  78.0 - 100.0 fL     MCH  29.8  26.0 - 34.0 pg     MCHC  35.0  30.0 - 36.0 g/dL     RDW  91.4  78.2 - 95.6 %     Platelets  372  150 - 400 K/uL     Neutrophils Relative  84 (*)  43 - 77 %     Neutro Abs  16.9 (*)  1.7 - 7.7 K/uL     Lymphocytes Relative  7 (*)  12 - 46 %     Lymphs Abs  1.4  0.7 - 4.0 K/uL     Monocytes Relative  9  3 - 12 %     Monocytes Absolute  1.9 (*)  0.1 - 1.0 K/uL     Eosinophils Relative  0  0 - 5 %     Eosinophils Absolute  0.0  0.0 - 0.7 K/uL     Basophils Relative  0  0 - 1 %      Basophils Absolute  0.0  0.0 - 0.1 K/uL    BASIC METABOLIC PANEL Status: Abnormal    Collection Time    10/26/12 7:44 PM   Component  Value  Range  Comment    Sodium  123 (*)  135 - 145 mEq/L     Potassium  3.6  3.5 - 5.1 mEq/L     Chloride  87 (*)  96 - 112 mEq/L     CO2  24  19 - 32 mEq/L     Glucose, Bld  106 (*)  70 - 99 mg/dL     BUN  13  6 - 23 mg/dL     Creatinine, Ser  2.13  0.50 - 1.10 mg/dL     Calcium  8.2 (*)  8.4 - 10.5 mg/dL     GFR calc non Af Amer  65 (*)  >90 mL/min     GFR calc Af Amer  75 (*)  >90 mL/min    Ct Abdomen Pelvis W Contrast  10/26/2012 *RADIOLOGY REPORT* Clinical Data: Abdominal pain. Elevated white blood count. Weight loss, nausea and vomiting, and anorexia. CT ABDOMEN AND PELVIS WITH CONTRAST Technique: Multidetector CT imaging of the abdomen and pelvis was performed following the standard protocol during bolus administration of intravenous  contrast. Contrast: OMNIPAQUE IOHEXOL 300 MG/ML SOLN Comparison: Radiographs dated 10/25/2012 Findings: The patient has acute sigmoid diverticulitis with perforation with a multi septated air containing pericolonic abscess low in the pelvis measuring 9.0 x 7.2 x 6.5 cm. There are numerous diverticula in the distal colon. 2.8 cm cyst on the otherwise normal right ovary. Left ovary is not appreciable. Uterus has been removed. IMPRESSION: Acute sigmoid diverticulitis with a large complex pelvic abscess. The patient does have bladder prolapse. Original Report Authenticated By: Francene Boyers, M.D.  Dg Abd 2 Views  10/25/2012 *RADIOLOGY REPORT* Clinical Data: Pain, constipation ABDOMEN - 2 VIEW Comparison: None. Findings: There is nonspecific nonobstructive bowel gas pattern. Post cholecystectomy surgical clips are noted. No free abdominal air. Mild degenerative changes lumbar spine. IMPRESSION: Nonspecific nonobstructive bowel gas pattern. Original Report Authenticated By: Natasha Mead, M.D.  Review of Systems  Constitutional:  Negative for fever and chills.  HENT: Negative.  Cardiovascular: Negative.  Gastrointestinal: Positive for nausea, vomiting and abdominal pain.  Genitourinary: Negative.  Skin: Negative.  Neurological: Negative.  Endo/Heme/Allergies: Negative.  Psychiatric/Behavioral: Negative.  Blood pressure 128/47, pulse 88, temperature 99.6 F (37.6 C), temperature source Oral, resp. rate 14, SpO2 99.00%.  Physical Exam  Constitutional: She is oriented to person, place, and time. She appears well-developed and well-nourished.  Eyes: Conjunctivae normal and EOM are normal. Pupils are equal, round, and reactive to light.  Neck: Normal range of motion. Neck supple.  Cardiovascular: Normal rate, regular rhythm and normal heart sounds.  Respiratory: Effort normal and breath sounds normal.  GI: Soft. Bowel sounds are normal. She exhibits no shifting dullness, no distension, no pulsatile liver and no fluid wave. There is tenderness in the suprapubic area, left upper quadrant and left lower quadrant. There is no rebound.  Musculoskeletal: Normal range of motion.  Neurological: She is alert and oriented to person, place, and time. She has normal reflexes.  Skin: Skin is warm and dry.  Psychiatric: She has a normal mood and affect. Her behavior is normal. Judgment and thought content normal.  Assessment/Plan:  Diverticulitis with pelvic abscess  Needs IV antibiotics and percutaneous drainage of pelvic abscess  If improves with this treatment then she can be fed and discharged eventually.  If she does not improve, she will need surgery with the possibility of a colostomy   The patient has been out of the hospital for several weeks now, but clearly has an inflammatory process that cannot be treated conservatively.  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  Her most recent CT shows near resolution of her pelvic abscess.  Because of the amount of inflammation in the pelvis,  it will be helpful to have preoperative ureteral stents placed.  Depending on the level of her inflammation and the anastomosis she may require a diverting loop ileostomy.  Marta Lamas. Gae Bon, MD, FACS [phone] [facility]

## 2012-12-19 NOTE — Transfer of Care (Signed)
Immediate Anesthesia Transfer of Care Note  Patient: Jackie Stevens  Procedure(s) Performed: Procedure(s) (LRB) with comments: COLON RESECTION SIGMOID (N/A) CYSTOSCOPY WITH STENT PLACEMENT (N/A)  Patient Location: PACU  Anesthesia Type:General  Level of Consciousness: awake, sedated and patient cooperative  Airway & Oxygen Therapy: Patient Spontanous Breathing and Patient connected to face mask oxygen  Post-op Assessment: Report given to PACU RN, Post -op Vital signs reviewed and stable and Patient moving all extremities  Post vital signs: Reviewed and stable  Complications: No apparent anesthesia complications

## 2012-12-19 NOTE — Op Note (Signed)
OPERATIVE REPORT  DATE OF OPERATION: 12/19/2012  PATIENT:  Altamese Dilling  67 y.o. female  PRE-OPERATIVE DIAGNOSIS:  DIVERTICULAR ABSCESS AND DIVERTICULITIS  POST-OPERATIVE DIAGNOSIS:  DIVERTICULAR ABSCESS AND DIVERTICULITIS  PROCEDURE:  Procedure(s): COLON RESECTION SIGMOID CYSTOSCOPY WITH STENT PLACEMENT  SURGEON:  Surgeon(s): Cherylynn Ridges, MD Lindaann Slough, MD Mariella Saa, MD  ASSISTANT: [place]  ANESTHESIA:   general  EBL: 150 ml  BLOOD ADMINISTERED: none  DRAINS: Urinary Catheter (Foley)   SPECIMEN:  Source of Specimen:  Sigmoid colon  COUNTS CORRECT:  YES  PROCEDURE DETAILS: The patient was taken to the operating room and placed on the table in the supine position. After an adequate general endotracheal anesthetic was administered she was placed in lithotomy and initially ureteral stent bilaterally were placed by the urologist. This note will be dictated separately.  After the bilateral ureteral stents were placed we went ahead and prepped and draped the patient in usual sterile manner exposing her perineum and also her abdomen.  After proper time out was performed identifying the patient and the procedure to be performed midline incision was made from the umbilicus all the way down to the pubic crest. It was taken down to and through the midline fascia using electrocautery.  Once within the peritoneal cavity the patient was placed in Trendelenburg position. With a Balfour retractor in place with an extension and a bladder blade we are able to palpate the significantly inflamed distal sigmoid colon down into the pelvis where it had attached itself to the left fallopian tube and left ovary.  We spent time dissecting away the sigmoid colon away from the surrounding structures. We came across the proximal sigmoid colon using a Contour blue stapler. We then took the mesentery of the proximal sigmoid colon and mid to distal sigmoid colon using a LigaSure device.  We initially got down to what appeared to be soft sigmoid colon distal to the primary area of inflammation. We came across this portion of the sigmoid colon using a Contour device.  The proximal colon was mobilized and subsequently the in cut off and a pursetring suture of 2-0 Prolenel. In order to hold in the adequately size anvil. Review size this showed that a 29 mm stapling device was adequate.  The assistant went down to the perineum and subsequently pass the stapling device through the rectum and anus up to the area where the anastomosis to be completed however because of continued scar tissue deep down in the pelvis we cannot maintain an anastomosis cannot be completed. We subsequently removed the stapling device and went ahead and mobilized more distal sigmoid colon and proximal rectum. This was done in the area where the drain came in from the left side of the patient.  We mobilized the colon and subsequently came across more distally approximately 6 cm more distally using a contour device. We subsequently performed an end-to-end anastomosis using a premium EEA 29 mm stapler. Subsequently does no demonstrated air leak. The assistant was able to pass the sigmoidoscope through the anastomosis with minimal difficulty. Pulses was anterior to the staple line there was a small amount of redundant a posterior: A blind pouch. This only represented about a centimeter or so of colon.  Once the anastomosis was completed as we were investigating the area near the anastomosis the drain to be seen coming out the lateral wall of the colon. We subsequently cut the drain and then retested our anastomosis by insufflating the rectum by the assistant with  fluid filled pelvis with saline. No anastomotic leak was demonstrated and again he was able to pass across the anastomosis with the sigmoidoscope.  Once this was completed the surgeon and assistant changed gloves and gowns. We subsequently irrigated with saline and  closed the abdomen using running looped #1 PDS suture.  All needle counts, sponge counts, and instrument counts were correct. A sterile dressing was applied including a Tegaderm type dressing with a honeycomb white sponge underneath it.  The percutaneous drain was removed externally after cutting it internally.  A dressing was applied.  PATIENT DISPOSITION:  PACU - hemodynamically stable.   Cherylynn Ridges 1/7/201411:06 AM

## 2012-12-19 NOTE — Op Note (Signed)
Jackie Stevens is a 67 y.o.   12/19/2012  Pre-op diagnosis: Pelvic abscess associated with sigmoid diverticulitis  Postop diagnosis: Same  Residual done: Cystoscopy, insertion of bilateral ureteral catheters  Surgeon: Wendie Simmer. Katty Fretwell  Anesthesia: General  Indication: Patient is a 67 years old female with a pelvic abscess associated with sigmoid diverticulitis. She is scheduled today by Dr. Lindie Spruce for sigmoid diverticulectomy. At his request I will place bilateral ureteral catheters prior to abdominal surgery  Procedure: Patient was identified by wrist band and proper timeout was taken.  Under general anesthesia she was prepped and draped and placed in the dorsolithotomy position. She has a grade 3 cystocele. A panendoscope was inserted in the bladder. The bladder mucosa is normal. There is no stone or tumor in the bladder. The ureteral orifices are in normal position and shape. I had to reduce the cystocele to visualize the ureteral orifices.  A Glidewire was passed through a #6 [place] open-ended catheter. The open ended catheter with a glide wire was then passed the cystoscope and the left ureteral orifice. The Glidewire was advanced in the ureter and the open-ended catheter was passed over the Glidewire up to the renal pelvis. The Glidewire was then removed.  The Glidewire was then passed into the open-ended catheter. The open-ended catheter with a Glidewire was then passed through the right ureteral orifice. The Glidewire was then advanced in the ureter and the open-ended catheter was passed over the Glidewire up to the renal pelvis. The guidewire was then removed.  The cystoscope was removed. A 18 French Foley catheter was then passed in the bladder. The Foley catheter and the ureteral catheters were attached to a Goldberg attachment and left straight drainage.  Patient was then left in the dorsolithotomy position for surgery by Dr. Lindie Spruce.

## 2012-12-19 NOTE — H&P (Signed)
   History reviewed, patient re-examined, no change in status, stable for surgery.

## 2012-12-19 NOTE — Anesthesia Preprocedure Evaluation (Signed)
Anesthesia Evaluation  Patient identified by MRN, date of birth, ID band Patient awake    Reviewed: Allergy & Precautions, H&P , NPO status , Patient's Chart, lab work & pertinent test results  History of Anesthesia Complications (+) PONV  Airway Mallampati: I TM Distance: >3 FB Neck ROM: full    Dental   Pulmonary sleep apnea ,          Cardiovascular hypertension, Rhythm:regular Rate:Normal     Neuro/Psych    GI/Hepatic GERD-  ,  Endo/Other  Hypothyroidism   Renal/GU Renal disease     Musculoskeletal   Abdominal   Peds  Hematology   Anesthesia Other Findings   Reproductive/Obstetrics                           Anesthesia Physical Anesthesia Plan  ASA: III  Anesthesia Plan: General   Post-op Pain Management:    Induction: Intravenous  Airway Management Planned: Oral ETT  Additional Equipment:   Intra-op Plan:   Post-operative Plan: Extubation in OR  Informed Consent: I have reviewed the patients History and Physical, chart, labs and discussed the procedure including the risks, benefits and alternatives for the proposed anesthesia with the patient or authorized representative who has indicated his/her understanding and acceptance.     Plan Discussed with: CRNA, Anesthesiologist and Surgeon  Anesthesia Plan Comments:         Anesthesia Quick Evaluation

## 2012-12-20 ENCOUNTER — Encounter (HOSPITAL_COMMUNITY): Payer: Self-pay | Admitting: General Surgery

## 2012-12-20 LAB — CBC
HCT: 29 % — ABNORMAL LOW (ref 36.0–46.0)
Hemoglobin: 9.8 g/dL — ABNORMAL LOW (ref 12.0–15.0)
MCH: 29.3 pg (ref 26.0–34.0)
MCHC: 33.8 g/dL (ref 30.0–36.0)
MCV: 86.6 fL (ref 78.0–100.0)
Platelets: 309 10*3/uL (ref 150–400)
RBC: 3.35 MIL/uL — ABNORMAL LOW (ref 3.87–5.11)
RDW: 14.7 % (ref 11.5–15.5)
WBC: 15 10*3/uL — ABNORMAL HIGH (ref 4.0–10.5)

## 2012-12-20 LAB — BASIC METABOLIC PANEL
BUN: 11 mg/dL (ref 6–23)
CO2: 26 mEq/L (ref 19–32)
Calcium: 8.3 mg/dL — ABNORMAL LOW (ref 8.4–10.5)
Chloride: 94 mEq/L — ABNORMAL LOW (ref 96–112)
Creatinine, Ser: 0.82 mg/dL (ref 0.50–1.10)
GFR calc Af Amer: 83 mL/min — ABNORMAL LOW (ref >90)
GFR calc non Af Amer: 72 mL/min — ABNORMAL LOW (ref >90)
Glucose, Bld: 135 mg/dL — ABNORMAL HIGH (ref 70–99)
Potassium: 4 mEq/L (ref 3.5–5.1)
Sodium: 130 mEq/L — ABNORMAL LOW (ref 135–145)

## 2012-12-20 MED ORDER — ONDANSETRON HCL 4 MG/2ML IJ SOLN: 4.0000 mg | Freq: Three times a day (TID) | INTRAMUSCULAR | Status: DC | PRN

## 2012-12-20 MED ORDER — BOOST / RESOURCE BREEZE PO LIQD
1.0000 | Freq: Three times a day (TID) | ORAL | Status: DC
  Administered 2012-12-20 – 2012-12-24 (×3): 1 via ORAL

## 2012-12-20 MED ORDER — KCL IN DEXTROSE-NACL 20-5-0.9 MEQ/L-%-% IV SOLN
INTRAVENOUS | Status: DC
  Administered 2012-12-20 – 2012-12-21 (×2): via INTRAVENOUS
  Filled 2012-12-20 (×8): qty 1000

## 2012-12-20 MED ORDER — HYDROMORPHONE HCL PF 1 MG/ML IJ SOLN
0.5000 mg | INTRAMUSCULAR | Status: DC | PRN
  Administered 2012-12-20 – 2012-12-21 (×2): 1 mg via INTRAVENOUS
  Filled 2012-12-20 (×2): qty 1

## 2012-12-20 NOTE — Progress Notes (Addendum)
INITIAL NUTRITION ASSESSMENT  DOCUMENTATION CODES Per approved criteria  -Severe malnutrition in the context of chronic illness -Obesity Unspecified   INTERVENTION:  Resource Breeze 3 times daily between meals (250 kcals, 9 gm protein per 8 fl oz carton) RD to follow for nutrition care plan  NUTRITION DIAGNOSIS: Predicted suboptimal nutrient intake related to diverticulitis as evidenced by chart/progress note review  Goal: Oral intake with meals & supplements to meet >/= 90% of estimated nutrition needs  Monitor:  PO & supplemental intake, weight, labs, I/O's  Reason for Assessment: Malnutrition Screening Tool Report  67 y.o. female  Admitting Dx: diverticular abscess and diverticulitis   ASSESSMENT: Patient admitted after having several weeks of abdominal pain and discomfort which she attributed to IBS; CT scan demonstrated a large pelvic abscess associated with diverticulitis.  Patient s/p procedures 1/7: COLON RESECTION SIGMOID  CYSTOSCOPY WITH STENT PLACEMENT  Patient has had a 10% weight loss since October 2013; PTA was experiencing persistent nausea, vomiting and decreased appetite; advanced to Full Liquids today 1332; no % meal intake records available at this time.  Patient meets criteria of severe malnutrition in the context of chronic illness as evidenced by 10% weight loss x 3 months and < 75% intake of estimated energy requirement for > 1 month.  Height: Ht Readings from Last 1 Encounters:  12/08/12 5\' 2"  (1.575 m)    Weight: Wt Readings from Last 1 Encounters:  12/08/12 195 lb 15.8 oz (88.9 kg)    Ideal Body Weight: 110 lb  % Ideal Body Weight: 177%  Wt Readings from Last 10 Encounters:  12/08/12 195 lb 15.8 oz (88.9 kg)  12/08/12 195 lb 15.8 oz (88.9 kg)  11/29/12 197 lb (89.359 kg)  11/28/12 198 lb 12.8 oz (90.175 kg)  11/21/12 198 lb 3.2 oz (89.903 kg)  11/14/12 197 lb 3.2 oz (89.449 kg)  11/13/12 197 lb 12.8 oz (89.721 kg)  11/05/12 216 lb  (97.977 kg)  10/26/12 204 lb (92.534 kg)  10/25/12 204 lb 12.8 oz (92.897 kg)    Usual Body Weight: 216 lb  % Usual Body Weight: 90%  BMI:  Body mass index is 35.85 kg/(m^2).  Estimated Nutritional Needs: Kcal: 1600-1800 Protein: 80-90 gm Fluid: 1.6-1.8 L  Skin: abdominal surgical incision   Diet Order: Full Liquid  EDUCATION NEEDS: -No education needs identified at this time   Intake/Output Summary (Last 24 hours) at 12/20/12 1448 Last data filed at 12/20/12 1000  Gross per 24 hour  Intake 2296.41 ml  Output    925 ml  Net 1371.41 ml    Last BM: 1/7  Labs:   Lab 12/20/12 0545  NA 130*  K 4.0  CL 94*  CO2 26  BUN 11  CREATININE 0.82  CALCIUM 8.3*  MG --  PHOS --  GLUCOSE 135*    Scheduled Meds:   . alvimopan  12 mg Oral BID  . enoxaparin  40 mg Subcutaneous Q24H  . etodolac  400 mg Oral BID  . hydrochlorothiazide  25 mg Oral Daily   And  . irbesartan  300 mg Oral Daily  . levothyroxine  150 mcg Oral Custom  . levothyroxine  225 mcg Oral Custom  . pantoprazole  40 mg Oral Q1200  . simvastatin  40 mg Oral QHS    Continuous Infusions:   . dextrose 5 % and 0.9 % NaCl with KCl 20 mEq/L 100 mL/hr at 12/20/12 0900    Past Medical History  Diagnosis Date  . Allergic  rhinitis   . GERD (gastroesophageal reflux disease)   . Hyperlipidemia   . Chicken pox as a child  . Measles as a child  . Insomnia   . Thyroid disease   . Constipation   . Diverticulitis 10/25/2012    pt. reports that a drain was placed - 09/2012    . Abnormal cervical cytology 10/25/2012    Follows with Dr Maggie Font of Gyn  . Asthma   . Heart murmur   . Complication of anesthesia   . PONV (postoperative nausea and vomiting)   . HTN (hypertension)     stress test completed by Walker Shadow, diagnosed as GERD  . Thyroid cancer 1980's  . Hypothyroidism   . H/O hiatal hernia   . External hemorrhoid, bleeding     "sometimes" (01-08-2013)  . Arthritis     "knees; right  thumb; shoulders" (2013/01/08)  . Kidney stones 1970's    "passed on their own" (Jan 08, 2013)  . OSA on CPAP     Past Surgical History  Procedure Date  . Thyroidectomy, partial 1988    "then did iodine to remove the rest" (2013/01/08)  . Sigmoid resection / rectopexy 08-Jan-2013  . Colon surgery   . Tonsillectomy 1951?  Marland Kitchen Cholecystectomy 1990  . Vaginal hysterectomy 1970's    "still have my ovaries" (01/08/13)  . Dilation and curettage of uterus 1960's    "lots of them; had miscarriages" (01-08-13)  . Transrectal drainage of pelvic abscess 10/27/2012    Kirkland Hun, RD, LDN Pager #: [phone] After-Hours Pager #: [phone]

## 2012-12-20 NOTE — Progress Notes (Signed)
Chaplain visited patient after receiving a printed spiritual care consult request. Patient was alert, responsive and recovering from surgery. Patient appeared to be a in some pain. Patient expressed her appreciation to God for the success of the surgery. Chaplain provided ministry of presence and empathic listening. Chaplain shared words of encouragement, hope and prayed with patient. Chaplain will follow-up as needed. Patient expressed her gratitude for Chaplain's support and presence.

## 2012-12-20 NOTE — Progress Notes (Signed)
GS Progress Note Subjective: Apparently the PCA is more bothersome than helpful.  Nurse asking to DC PCA and jsut start intermittent dosing as requested.  Objective: Vital signs in last 24 hours: Temp:  [97.5 F (36.4 C)-98.5 F (36.9 C)] 98.1 F (36.7 C) (01/08 0458) Pulse Rate:  [67-98] 80  (01/08 0458) Resp:  [10-24] 10  (01/08 0555) BP: (107-155)/(36-70) 122/55 mmHg (01/08 0458) SpO2:  [91 %-100 %] 97 % (01/08 0555) Last BM Date: 12/19/12  Intake/Output from previous day: 01/07 0701 - 01/08 0700 In: 4546.4 [P.O.:360; I.V.:3786.4; IV Piggyback:400] Out: 1050 [Urine:850; Blood:200] Intake/Output this shift:    Lungs: Clear to auscultation  Abd: Soft, some bowel sounds.  No tenderness  Extremities: No DVT signs or symtpoms  Neuro: Intact  Lab Results: CBC   Basename 12/20/12 0545  WBC 15.0*  HGB 9.8*  HCT 29.0*  PLT 309   BMET  Basename 12/20/12 0545  NA 130*  K 4.0  CL 94*  CO2 26  GLUCOSE 135*  BUN 11  CREATININE 0.82  CALCIUM 8.3*   PT/INR No results found for this basename: LABPROT:2,INR:2 in the last 72 hours ABG No results found for this basename: PHART:2,PCO2:2,PO2:2,HCO3:2 in the last 72 hours  Studies/Results: No results found.  Anti-infectives: Anti-infectives     Start     Dose/Rate Route Frequency Ordered Stop   12/19/12 1600   cefOXitin (MEFOXIN) 1 g in dextrose 5 % 50 mL IVPB        1 g 100 mL/hr over 30 Minutes Intravenous Every 6 hours 12/19/12 1449 12/20/12 0420   12/19/12 0600   cefOXitin (MEFOXIN) 2 g in dextrose 5 % 50 mL IVPB        2 g 100 mL/hr over 30 Minutes Intravenous On call to O.R. 12/18/12 1447 12/19/12 0753          Assessment/Plan: s/p Procedure(s): COLON RESECTION SIGMOID CYSTOSCOPY WITH STENT PLACEMENT d/c foley Advance diet Change IVFs to increase Na+   LOS: 1 day    Marta Lamas. Gae Bon, MD, FACS [phone] [facility] 12/20/2012

## 2012-12-21 LAB — CBC
HCT: 25.7 % — ABNORMAL LOW (ref 36.0–46.0)
Hemoglobin: 8.7 g/dL — ABNORMAL LOW (ref 12.0–15.0)
MCH: 29.5 pg (ref 26.0–34.0)
MCHC: 33.9 g/dL (ref 30.0–36.0)
MCV: 87.1 fL (ref 78.0–100.0)
Platelets: 252 10*3/uL (ref 150–400)
RBC: 2.95 MIL/uL — ABNORMAL LOW (ref 3.87–5.11)
RDW: 14.9 % (ref 11.5–15.5)
WBC: 9.4 10*3/uL (ref 4.0–10.5)

## 2012-12-21 LAB — BASIC METABOLIC PANEL
BUN: 10 mg/dL (ref 6–23)
CO2: 24 mEq/L (ref 19–32)
Calcium: 7.8 mg/dL — ABNORMAL LOW (ref 8.4–10.5)
Chloride: 96 mEq/L (ref 96–112)
Creatinine, Ser: 0.86 mg/dL (ref 0.50–1.10)
GFR calc Af Amer: 79 mL/min — ABNORMAL LOW (ref >90)
GFR calc non Af Amer: 68 mL/min — ABNORMAL LOW (ref >90)
Glucose, Bld: 118 mg/dL — ABNORMAL HIGH (ref 70–99)
Potassium: 3.6 mEq/L (ref 3.5–5.1)
Sodium: 129 mEq/L — ABNORMAL LOW (ref 135–145)

## 2012-12-21 NOTE — Progress Notes (Signed)
[facility] Progress Note 2 Days Post-Op  Subjective: Patient looks very good.  No distress.  Not much pain.  Slept very well last night.  Objective: Vital signs in last 24 hours: Temp:  [97.9 F (36.6 C)-98.2 F (36.8 C)] 98.2 F (36.8 C) (01/09 0522) Pulse Rate:  [70-86] 70  (01/09 0522) Resp:  [16-18] 16  (01/09 0522) BP: (109-131)/(45-59) 131/56 mmHg (01/09 0522) SpO2:  [97 %-100 %] 97 % (01/09 0522) Last BM Date: 12/19/12  Intake/Output from previous day: 01/08 0701 - 01/09 0700 In: 2870 [P.O.:780; I.V.:2090] Out: 225 [Urine:225] Intake/Output this shift:    General: No distress  Lungs: Clear  Abd: Soft, hypoactive bowel sounds.  Extremities: No DVT signs or symptoms.  Neuro: Intact  Lab Results:  @LABLAST2 (wbc:2,hgb:2,hct:2,plt:2) BMET  Basename 12/21/12 0435 12/20/12 0545  NA 129* 130*  K 3.6 4.0  CL 96 94*  CO2 24 26  GLUCOSE 118* 135*  BUN 10 11  CREATININE 0.86 0.82  CALCIUM 7.8* 8.3*   PT/INR No results found for this basename: LABPROT:2,INR:2 in the last 72 hours ABG No results found for this basename: PHART:2,PCO2:2,PO2:2,HCO3:2 in the last 72 hours  Studies/Results: No results found.  Anti-infectives: Anti-infectives     Start     Dose/Rate Route Frequency Ordered Stop   12/19/12 1600   cefOXitin (MEFOXIN) 1 g in dextrose 5 % 50 mL IVPB        1 g 100 mL/hr over 30 Minutes Intravenous Every 6 hours 12/19/12 1449 12/20/12 0420   12/19/12 0600   cefOXitin (MEFOXIN) 2 g in dextrose 5 % 50 mL IVPB        2 g 100 mL/hr over 30 Minutes Intravenous On call to O.R. 12/18/12 1447 12/19/12 0753          Assessment/Plan: s/p Procedure(s): COLON RESECTION SIGMOID CYSTOSCOPY WITH STENT PLACEMENT Advance diet Decrease IVF Pathology is benign diverticular inflammation without malignancy.  Abscess seen.  LOS: 2 days   Marta Lamas. Gae Bon, MD, FACS [phone] [facility] 12/21/2012

## 2012-12-22 MED ORDER — BIOTENE DRY MOUTH MT LIQD
15.0000 mL | Freq: Two times a day (BID) | OROMUCOSAL | Status: DC
  Administered 2012-12-22 – 2012-12-25 (×6): 15 mL via OROMUCOSAL

## 2012-12-22 MED ORDER — HYDROCODONE-ACETAMINOPHEN 5-325 MG PO TABS
1.0000 | ORAL_TABLET | ORAL | Status: DC | PRN
  Administered 2012-12-22 – 2012-12-25 (×12): 2 via ORAL
  Filled 2012-12-22 (×12): qty 2

## 2012-12-22 NOTE — Progress Notes (Signed)
GS Progress Note Subjective: Patient dong very well.  IV came out.  No bowel movement or flatus  Objective: Vital signs in last 24 hours: Temp:  [97.4 F (36.3 C)-98.3 F (36.8 C)] 98 F (36.7 C) (01/10 0531) Pulse Rate:  [74-79] 74  (01/10 0531) Resp:  [18] 18  (01/10 0531) BP: (131-142)/(54-60) 131/54 mmHg (01/10 0531) SpO2:  [97 %-100 %] 97 % (01/10 0531) Last BM Date: 12/19/12  Intake/Output from previous day: 01/09 0701 - 01/10 0700 In: 1525 [P.O.:720; I.V.:805] Out: 2450 [Urine:2450] Intake/Output this shift:    Lungs: Clear  Abd: Soft, nontender, good bowel sounds  Extremities: No DVT signs or symtpoms  Neuro: Intact  Lab Results: CBC   Basename 12/21/12 0435 12/20/12 0545  WBC 9.4 15.0*  HGB 8.7* 9.8*  HCT 25.7* 29.0*  PLT 252 309   BMET  Basename 12/21/12 0435 12/20/12 0545  NA 129* 130*  K 3.6 4.0  CL 96 94*  CO2 24 26  GLUCOSE 118* 135*  BUN 10 11  CREATININE 0.86 0.82  CALCIUM 7.8* 8.3*   PT/INR No results found for this basename: LABPROT:2,INR:2 in the last 72 hours ABG No results found for this basename: PHART:2,PCO2:2,PO2:2,HCO3:2 in the last 72 hours  Studies/Results: No results found.  Anti-infectives: Anti-infectives     Start     Dose/Rate Route Frequency Ordered Stop   12/19/12 1600   cefOXitin (MEFOXIN) 1 g in dextrose 5 % 50 mL IVPB        1 g 100 mL/hr over 30 Minutes Intravenous Every 6 hours 12/19/12 1449 12/20/12 0420   12/19/12 0600   cefOXitin (MEFOXIN) 2 g in dextrose 5 % 50 mL IVPB        2 g 100 mL/hr over 30 Minutes Intravenous On call to O.R. 12/18/12 1447 12/19/12 0753          Assessment/Plan: s/p Procedure(s): COLON RESECTION SIGMOID CYSTOSCOPY WITH STENT PLACEMENT Advance diet Plan for discharge tomorrow Soft diet maybe later today. Leave IV out Increase oral pain medications  LOS: 3 days    Marta Lamas. Gae Bon, MD, FACS [phone] [facility]  Surgery 12/22/2012

## 2012-12-22 NOTE — Progress Notes (Signed)
Loss of IV access. Pt did not want restart at this time.

## 2012-12-23 ENCOUNTER — Inpatient Hospital Stay (HOSPITAL_COMMUNITY): Payer: Medicare Other

## 2012-12-23 LAB — CBC WITH DIFFERENTIAL/PLATELET
Basophils Absolute: 0 10*3/uL (ref 0.0–0.1)
Basophils Relative: 0 % (ref 0–1)
Eosinophils Absolute: 0.2 10*3/uL (ref 0.0–0.7)
Eosinophils Relative: 2 % (ref 0–5)
HCT: 29.9 % — ABNORMAL LOW (ref 36.0–46.0)
Hemoglobin: 10 g/dL — ABNORMAL LOW (ref 12.0–15.0)
Lymphocytes Relative: 16 % (ref 12–46)
Lymphs Abs: 1.4 10*3/uL (ref 0.7–4.0)
MCH: 28.9 pg (ref 26.0–34.0)
MCHC: 33.4 g/dL (ref 30.0–36.0)
MCV: 86.4 fL (ref 78.0–100.0)
Monocytes Absolute: 0.7 10*3/uL (ref 0.1–1.0)
Monocytes Relative: 8 % (ref 3–12)
Neutro Abs: 6.6 10*3/uL (ref 1.7–7.7)
Neutrophils Relative %: 74 % (ref 43–77)
Platelets: 298 10*3/uL (ref 150–400)
RBC: 3.46 MIL/uL — ABNORMAL LOW (ref 3.87–5.11)
RDW: 14.7 % (ref 11.5–15.5)
WBC: 8.9 10*3/uL (ref 4.0–10.5)

## 2012-12-23 LAB — BASIC METABOLIC PANEL
BUN: 6 mg/dL (ref 6–23)
CO2: 26 mEq/L (ref 19–32)
Calcium: 9.2 mg/dL (ref 8.4–10.5)
Chloride: 96 mEq/L (ref 96–112)
Creatinine, Ser: 0.67 mg/dL (ref 0.50–1.10)
GFR calc Af Amer: >90 mL/min (ref >90)
GFR calc non Af Amer: 88 mL/min — ABNORMAL LOW (ref >90)
Glucose, Bld: 110 mg/dL — ABNORMAL HIGH (ref 70–99)
Potassium: 4.1 mEq/L (ref 3.5–5.1)
Sodium: 133 mEq/L — ABNORMAL LOW (ref 135–145)

## 2012-12-23 MED ORDER — FLUCONAZOLE 200 MG PO TABS
200.0000 mg | ORAL_TABLET | Freq: Every day | ORAL | Status: DC
  Administered 2012-12-23 – 2012-12-25 (×3): 200 mg via ORAL
  Filled 2012-12-23 (×3): qty 1

## 2012-12-23 MED ORDER — ONDANSETRON HCL 4 MG PO TABS
4.0000 mg | ORAL_TABLET | Freq: Four times a day (QID) | ORAL | Status: DC | PRN
  Administered 2012-12-23: 4 mg via ORAL
  Filled 2012-12-23: qty 1

## 2012-12-23 NOTE — Progress Notes (Signed)
GS Progress Note Subjective: Patient having amore abdominal pain this morning than previously.  Most of pain is central.  Objective: Vital signs in last 24 hours: Temp:  [97.9 F (36.6 C)-98.3 F (36.8 C)] 97.9 F (36.6 C) (01/11 0541) Pulse Rate:  [73-81] 73  (01/11 0541) Resp:  [18] 18  (01/11 0541) BP: (124-149)/(51-60) 135/51 mmHg (01/11 0541) SpO2:  [95 %-100 %] 100 % (01/11 0541) Last BM Date: 12/19/12  Intake/Output from previous day: 01/10 0701 - 01/11 0700 In: 780 [P.O.:780] Out: 3775 [Urine:3775] Intake/Output this shift:    Lungs: Clear to auscultation  Abd: Soft, hypoactive bowel sounds, no guarding, some rebound.  Extremities: No DVT signs or symptoms.  Neuro: Intact  Lab Results: CBC   Basename 12/21/12 0435  WBC 9.4  HGB 8.7*  HCT 25.7*  PLT 252   BMET  Basename 12/21/12 0435  NA 129*  K 3.6  CL 96  CO2 24  GLUCOSE 118*  BUN 10  CREATININE 0.86  CALCIUM 7.8*   PT/INR No results found for this basename: LABPROT:2,INR:2 in the last 72 hours ABG No results found for this basename: PHART:2,PCO2:2,PO2:2,HCO3:2 in the last 72 hours  Studies/Results: No results found.  Anti-infectives: Anti-infectives     Start     Dose/Rate Route Frequency Ordered Stop   12/19/12 1600   cefOXitin (MEFOXIN) 1 g in dextrose 5 % 50 mL IVPB        1 g 100 mL/hr over 30 Minutes Intravenous Every 6 hours 12/19/12 1449 12/20/12 0420   12/19/12 0600   cefOXitin (MEFOXIN) 2 g in dextrose 5 % 50 mL IVPB        2 g 100 mL/hr over 30 Minutes Intravenous On call to O.R. 12/18/12 1447 12/19/12 0753          Assessment/Plan: s/p Procedure(s): COLON RESECTION SIGMOID CYSTOSCOPY WITH STENT PLACEMENT Check CBC with diff and 3-way abdominal series looking for significant amount of free air., possible leak Although the patient is afebrile and does not looks severely sick, I will check some studies to look for a leak.  All her vital signs are good, she has bowel  sounds, and her urine output is great.  Being cautious. Patient also has thrush.  Will Rx Diflucan PO  LOS: 4 days    Marta Lamas. Gae Bon, MD, FACS [phone] [facility] 12/23/2012

## 2012-12-24 NOTE — Progress Notes (Signed)
GS Progress Note Subjective: Still no BM or flatus  Objective: Vital signs in last 24 hours: Temp:  [97.5 F (36.4 C)-98.4 F (36.9 C)] 97.5 F (36.4 C) (01/12 0700) Pulse Rate:  [75-89] 75  (01/12 0700) Resp:  [19] 19  (01/12 0700) BP: (139-147)/(51-67) 147/63 mmHg (01/12 0700) SpO2:  [98 %-100 %] 100 % (01/12 0700) Last BM Date: 12/19/12  Intake/Output from previous day: 01/11 0701 - 01/12 0700 In: 590 [P.O.:590] Out: 2350 [Urine:2350] Intake/Output this shift: Total I/O In: -  Out: 900 [Urine:900]  Lungs: Clear  Abd: Soft, distended, hypoactive but present bowel sounds.  Extremities: No DVT signs or symptoms  Neuro: Intact  Lab Results: CBC   Basename 12/23/12 0947  WBC 8.9  HGB 10.0*  HCT 29.9*  PLT 298   BMET  Basename 12/23/12 0947  NA 133*  K 4.1  CL 96  CO2 26  GLUCOSE 110*  BUN 6  CREATININE 0.67  CALCIUM 9.2   PT/INR No results found for this basename: LABPROT:2,INR:2 in the last 72 hours ABG No results found for this basename: PHART:2,PCO2:2,PO2:2,HCO3:2 in the last 72 hours  Studies/Results: Dg Abd Acute W/chest  12/23/2012  *RADIOLOGY REPORT*  Clinical Data: Increasing abdominal pain.  Free air.  ACUTE ABDOMEN SERIES (ABDOMEN 2 VIEW & CHEST 1 VIEW)  Comparison: 12/08/2012.  Findings: Increasing subsegmental atelectasis at the bases. Cardiopericardial silhouette appears within normal limits.  There is no plain film evidence of free air. Cholecystectomy clips are present in the right upper quadrant.  Multiple air-fluid levels are present within the abdomen.  Surgical staples are present over the lower abdomen, suggesting recent laparotomy.  Distention of both large and small bowel.  IMPRESSION:  1.  No plain film evidence of free air.  No acute cardiopulmonary disease. 2.  Mild distention of large and small bowel with multiple air- fluid levels compatible with postoperative ileus in the setting of recent operation.   Original Report  Authenticated By: Andreas Newport, M.D.     Anti-infectives: Anti-infectives     Start     Dose/Rate Route Frequency Ordered Stop   12/23/12 1000   fluconazole (DIFLUCAN) tablet 200 mg        200 mg Oral Daily 12/23/12 0728     12/19/12 1600   cefOXitin (MEFOXIN) 1 g in dextrose 5 % 50 mL IVPB        1 g 100 mL/hr over 30 Minutes Intravenous Every 6 hours 12/19/12 1449 12/20/12 0420   12/19/12 0600   cefOXitin (MEFOXIN) 2 g in dextrose 5 % 50 mL IVPB        2 g 100 mL/hr over 30 Minutes Intravenous On call to O.R. 12/18/12 1447 12/19/12 0753          Assessment/Plan: s/p Procedure(s): COLON RESECTION SIGMOID CYSTOSCOPY WITH STENT PLACEMENT Postoperative ileus Wait for BM or flatus.   No free air or increased WBC count on yesterday's work up  LOS: 5 days    Marta Lamas. Gae Bon, MD, FACS [phone] [facility] 12/24/2012

## 2012-12-25 ENCOUNTER — Telehealth (INDEPENDENT_AMBULATORY_CARE_PROVIDER_SITE_OTHER): Payer: Self-pay | Admitting: General Surgery

## 2012-12-25 MED ORDER — HYDROCHLOROTHIAZIDE 25 MG PO TABS: 25.0000 mg | ORAL_TABLET | Freq: Every day | ORAL | Status: DC

## 2012-12-25 MED ORDER — IRBESARTAN 300 MG PO TABS: 300.0000 mg | ORAL_TABLET | Freq: Every day | ORAL | Status: DC

## 2012-12-25 MED ORDER — FLUCONAZOLE 200 MG PO TABS: 200.0000 mg | ORAL_TABLET | Freq: Every day | ORAL | Status: DC

## 2012-12-25 MED ORDER — HYDROCODONE-ACETAMINOPHEN 5-325 MG PO TABS: 1.0000 | ORAL_TABLET | ORAL | Status: DC | PRN

## 2012-12-25 MED ORDER — HYDROCORTISONE 2.5 % RE CREA: TOPICAL_CREAM | Freq: Two times a day (BID) | RECTAL | Status: DC

## 2012-12-25 NOTE — Progress Notes (Signed)
Pt tolerated regular diet for breakfast and lunch. Discharged home accompanied by spouse.Jackie Stevens 12/25/2012

## 2012-12-25 NOTE — Progress Notes (Signed)
GS Progress Note Subjective:  The patient had several bowel movements yesterday.  No more vaginal discharge, but still has irritation.  Afraid to eat.  Objective: Vital signs in last 24 hours: Temp:  [97.9 F (36.6 C)-98.1 F (36.7 C)] 97.9 F (36.6 C) (01/13 0529) Pulse Rate:  [75-77] 75  (01/12 2235) Resp:  [16-18] 18  (01/13 0529) BP: (128-139)/(52-66) 137/56 mmHg (01/13 0529) SpO2:  [98 %-99 %] 98 % (01/13 0529) Last BM Date: 12/24/12  Intake/Output from previous day: 01/12 0701 - 01/13 0700 In: 1300 [P.O.:1300] Out: 2100 [Urine:2100] Intake/Output this shift:    Lungs: Clear  Abd: Much softer, good bowel wounds, wound is clean and dry. Minimal tenderness  Extremities: No DVT signs or symptoms  Neuro: Intact  Lab Results: CBC   Basename 12/23/12 0947  WBC 8.9  HGB 10.0*  HCT 29.9*  PLT 298   BMET  Basename 12/23/12 0947  NA 133*  K 4.1  CL 96  CO2 26  GLUCOSE 110*  BUN 6  CREATININE 0.67  CALCIUM 9.2   PT/INR No results found for this basename: LABPROT:2,INR:2 in the last 72 hours ABG No results found for this basename: PHART:2,PCO2:2,PO2:2,HCO3:2 in the last 72 hours  Studies/Results: Dg Abd Acute W/chest  12/23/2012  *RADIOLOGY REPORT*  Clinical Data: Increasing abdominal pain.  Free air.  ACUTE ABDOMEN SERIES (ABDOMEN 2 VIEW & CHEST 1 VIEW)  Comparison: 12/08/2012.  Findings: Increasing subsegmental atelectasis at the bases. Cardiopericardial silhouette appears within normal limits.  There is no plain film evidence of free air. Cholecystectomy clips are present in the right upper quadrant.  Multiple air-fluid levels are present within the abdomen.  Surgical staples are present over the lower abdomen, suggesting recent laparotomy.  Distention of both large and small bowel.  IMPRESSION:  1.  No plain film evidence of free air.  No acute cardiopulmonary disease. 2.  Mild distention of large and small bowel with multiple air- fluid levels compatible with  postoperative ileus in the setting of recent operation.   Original Report Authenticated By: Andreas Newport, M.D.     Anti-infectives: Anti-infectives     Start     Dose/Rate Route Frequency Ordered Stop   12/23/12 1000   fluconazole (DIFLUCAN) tablet 200 mg        200 mg Oral Daily 12/23/12 0728     12/19/12 1600   cefOXitin (MEFOXIN) 1 g in dextrose 5 % 50 mL IVPB        1 g 100 mL/hr over 30 Minutes Intravenous Every 6 hours 12/19/12 1449 12/20/12 0420   12/19/12 0600   cefOXitin (MEFOXIN) 2 g in dextrose 5 % 50 mL IVPB        2 g 100 mL/hr over 30 Minutes Intravenous On call to O.R. 12/18/12 1447 12/19/12 0753          Assessment/Plan: s/p Procedure(s): COLON RESECTION SIGMOID CYSTOSCOPY WITH STENT PLACEMENT Advance diet Discharge Will discharge later today.  LOS: 6 days    Marta Lamas. Gae Bon, MD, FACS [phone] [facility] 12/25/2012

## 2012-12-25 NOTE — Telephone Encounter (Signed)
Patient called saying she was discharged with new blood pressure medications. She was also given her old one. I advised her there is not a note about changing her medications so I advised her to keep taking her old blood pressure medication and contact her PCP to clarify any changes. I would get this message to Dr Lindie Spruce to make sure she wasn't supposed to be on multiple blood pressure medications since hospital stay.

## 2012-12-25 NOTE — Discharge Summary (Signed)
Physician Discharge Summary  Patient ID: MAYVIS AGUDELO MRN: [id number] DOB/AGE: 1946/02/06 67 y.o.  Admit date: 12/19/2012 Discharge date: 12/25/2012  Admission Diagnoses:  Discharge Diagnoses:  Active Problems:  * No active hospital problems. *    Discharged Condition: good  Hospital Course: Admitted after sigmoid colectomy for severe diverticulitis.   Did well on Entereg postoperatively.  Several bowel movements the day prior to discharge.  No fevers or problems postop.  Consults: None  Significant Diagnostic Studies: labs: cbc and radiology: CXR: normal and KUB: ileus  Treatments: IV hydration and surgery: colectomy  Discharge Exam: Blood pressure 137/56, pulse 75, temperature 97.9 F (36.6 C), temperature source Oral, resp. rate 18, height 5\' 2"  (1.575 m), weight 88.9 kg (195 lb 15.8 oz), SpO2 98.00%. General appearance: alert, cooperative and no distress GI: soft, non-tender; bowel sounds normal; no masses,  no organomegaly  Disposition: 01-Home or Self Care  Discharge Orders    Future Appointments: Provider: Department: Dept Phone: Center:   12/29/2012 9:40 AM Currie Paris, MD [facility], [place] [phone] None   07/25/2013 11:00 AM Waymon Budge, MD [facility] [phone] None     Future Orders Please Complete By Expires   Diet general      Increase activity slowly      Discharge instructions      Comments:   See attached instructions   Driving Restrictions      Comments:   No driving for one week   Lifting restrictions      Comments:   No lifting > 25 pounds for 5 weeks.   No dressing needed      Comments:   May shower and pat the wound dry.       Medication List     As of 12/25/2012  7:45 AM    STOP taking these medications         nystatin 100000 UNIT/ML suspension   Commonly known as: MYCOSTATIN      traMADol 50 MG tablet   Commonly known as: ULTRAM      TAKE these medications         aspirin EC 81 MG tablet     Take 81 mg by mouth daily. Hold 5 days prior to procedure      etodolac 400 MG tablet   Commonly known as: LODINE   Take 400 mg by mouth 2 (two) times daily.      fluconazole 200 MG tablet   Commonly known as: DIFLUCAN   Take 1 tablet (200 mg total) by mouth daily.      hydrochlorothiazide 25 MG tablet   Commonly known as: HYDRODIURIL   Take 1 tablet (25 mg total) by mouth daily.      HYDROcodone-acetaminophen 5-325 MG per tablet   Commonly known as: NORCO/VICODIN   Take 1-2 tablets by mouth every 4 (four) hours as needed.      hydrocortisone 2.5 % rectal cream   Commonly known as: ANUSOL-HC   Place rectally 2 (two) times daily. Apply to affected area twice daily      irbesartan 300 MG tablet   Commonly known as: AVAPRO   Take 1 tablet (300 mg total) by mouth daily.      Krill Oil 300 MG Caps   Take 300 mg by mouth daily. Hold 5 days prior to procedure      levothyroxine 150 MCG tablet   Commonly known as: SYNTHROID, LEVOTHROID   Take 150-225 mcg by mouth  daily. 1 tablet (150 mcg) every day except Sunday, 1.5 tablet (225 mcg) on Sunday      nitroGLYCERIN 0.4 MG SL tablet   Commonly known as: NITROSTAT   Place 0.4 mg under the tongue every 5 (five) minutes as needed. For chest pain      PRILOSEC OTC 20 MG tablet   Generic drug: omeprazole   Take 20 mg by mouth daily before breakfast.      psyllium 0.52 G capsule   Commonly known as: REGULOID   Take 0.52 g by mouth at bedtime.      simvastatin 40 MG tablet   Commonly known as: ZOCOR   Take 40 mg by mouth at bedtime.      valsartan-hydrochlorothiazide 320-25 MG per tablet   Commonly known as: DIOVAN-HCT   Take 1 tablet by mouth daily with breakfast.      vitamin B-12 1000 MCG tablet   Commonly known as: CYANOCOBALAMIN   Take 1,000 mcg by mouth daily.           Follow-up Information    Follow up with Brennan [name], MA. Schedule an appointment as soon as possible for a visit on 12/29/2012. (For staple  removal)    Contact information:   [address] [place] [address]       Follow up with Cherylynn Ridges, MD. Schedule an appointment as soon as possible for a visit in 2 weeks.   Contact information:   [address] [place] [address] [phone]          Signed: Cherylynn Ridges 12/25/2012, 7:45 AM

## 2012-12-26 ENCOUNTER — Telehealth: Payer: Self-pay | Admitting: Family Medicine

## 2012-12-26 NOTE — Telephone Encounter (Signed)
Just make sure she knows where her follow up appt will occur and have her come in sooner if not feeling well

## 2012-12-26 NOTE — Telephone Encounter (Signed)
Patient thinks Dr. Lindie Spruce wants her to double her bp med. She wants to know if that is okay with you.

## 2012-12-26 NOTE — Telephone Encounter (Signed)
Patient states that she had abdominal surgery 12/19/12. She states as she was being discharged the nurse said the patient may want to consult with her PCP about the bp med that Dr. Lindie Spruce prescribed. He gave her an Rx for additional Hydrochlorothiazide 25mg  & Irbesartan 300mg . Patient is wondering why the surgeon doubled her bp med. Patient also states she vomited once this morning. Patient SW [facility] last night declined my offer to transfer her to [facility] this morning to discuss. Patient did state she felt much better after throwing up. Patient is going to get her staples out 12/29/12 & will make fu visit after that.

## 2012-12-27 ENCOUNTER — Telehealth (INDEPENDENT_AMBULATORY_CARE_PROVIDER_SITE_OTHER): Payer: Self-pay

## 2012-12-27 NOTE — Telephone Encounter (Signed)
Yes that is OK they saw BP that was too hi in hospital. As seh feels better thought the bp may improve so if she feels light headed she needs to drop the dosing again. Then she should come in her for some lab work and bp check in the next couple weeks for [place] to follow this up and check on it

## 2012-12-27 NOTE — Telephone Encounter (Signed)
Pt informed and appt scheduled.

## 2012-12-27 NOTE — Telephone Encounter (Signed)
The pt called because she hasn't had a bm since she was home.  She has been drinking apple juice and eating some prunes.  I told her to drink plenty of liquids, try Miralax once to twice a day and continue to walk around.  She is doing some walking and has started passing gas.  She asked if she can have eggs.  I told her she can try them and increase her diet as tolerated.  She will call if still has concerns.

## 2012-12-29 ENCOUNTER — Ambulatory Visit (INDEPENDENT_AMBULATORY_CARE_PROVIDER_SITE_OTHER): Payer: Medicare Other

## 2012-12-29 DIAGNOSIS — Z4802 Encounter for removal of sutures: Secondary | ICD-10-CM

## 2012-12-29 NOTE — Progress Notes (Signed)
Patient came in for staple removal from colon resection on 12/19/12  By Dr. Lindie Spruce. Incision is healed with no signs of infection. All staples removed and steri strips applied. Patient will follow up with Dr. Lindie Spruce on 01/09/13.

## 2013-01-04 ENCOUNTER — Encounter: Payer: Self-pay | Admitting: Family Medicine

## 2013-01-04 ENCOUNTER — Ambulatory Visit (INDEPENDENT_AMBULATORY_CARE_PROVIDER_SITE_OTHER): Payer: Medicare Other | Admitting: Family Medicine

## 2013-01-04 VITALS — BP 118/64 | HR 85 | Temp 97.9°F | Ht 62.0 in | Wt 185.0 lb

## 2013-01-04 DIAGNOSIS — I10 Essential (primary) hypertension: Secondary | ICD-10-CM

## 2013-01-04 DIAGNOSIS — K5720 Diverticulitis of large intestine with perforation and abscess without bleeding: Secondary | ICD-10-CM

## 2013-01-04 DIAGNOSIS — K219 Gastro-esophageal reflux disease without esophagitis: Secondary | ICD-10-CM

## 2013-01-04 DIAGNOSIS — K630 Abscess of intestine: Secondary | ICD-10-CM

## 2013-01-04 MED ORDER — RANITIDINE HCL 150 MG PO TABS: 150.0000 mg | ORAL_TABLET | Freq: Every day | ORAL | Status: DC

## 2013-01-04 NOTE — Patient Instructions (Addendum)

## 2013-01-05 ENCOUNTER — Encounter: Payer: Self-pay | Admitting: Family Medicine

## 2013-01-05 NOTE — Assessment & Plan Note (Addendum)
Recovering from surgical debridement. Doing much better has follow up with surgery soon, encouraged daily probiotics, good hydration

## 2013-01-05 NOTE — Assessment & Plan Note (Addendum)
Well controlled on DiovanHCT will stop Irbesartan and HCTZ and recheck pressure in 2 weeks or as needed

## 2013-01-05 NOTE — Progress Notes (Signed)
Patient ID: Jackie Stevens, female   DOB: [birth year], 67 y.o.   MRN: [id number] Jackie Stevens [id number] 1946/04/23 01/05/2013      Progress Note-Follow Up  Subjective  Chief Complaint  Chief Complaint  Patient presents with  . Follow-up    hospital and check BP    HPI Patient is a 67 year old Caucasian female who is here today for hospital followup. She is overall feeling improved status post her surgery for her ruptured diverticular abscess. She is moving her bowels routinely. No bloody or tarry stool. She is noting some irritation and dryness in her throat it wondering if metoclopramide would help this. She denies any dyspepsia or sour taste in the mouth. She has followup scheduled soon with her surgeon. She is concerned about her blood pressure medicines being too high. She did have an episode of lightheadedness yesterday. Today she's only taken her Diovan HCT. No chest pain, palpitations, fevers, GU complaints noted today.      Past Medical History  Diagnosis Date  . Allergic rhinitis   . GERD (gastroesophageal reflux disease)   . Hyperlipidemia   . Chicken pox as a child  . Measles as a child  . Insomnia   . Thyroid disease   . Constipation   . Diverticulitis 10/25/2012    pt. reports that a drain was placed - 09/2012    . Abnormal cervical cytology 10/25/2012    Follows with Dr Maggie Font of Gyn  . Asthma   . Heart murmur   . Complication of anesthesia   . PONV (postoperative nausea and vomiting)   . HTN (hypertension)     stress test completed by Walker Shadow, diagnosed as GERD  . Thyroid cancer 1980's  . Hypothyroidism   . H/O hiatal hernia   . External hemorrhoid, bleeding     "sometimes" (01/14/13)  . Arthritis     "knees; right thumb; shoulders" (14-Jan-2013)  . Kidney stones 1970's    "passed on their own" (2013-01-14)  . OSA on CPAP     Past Surgical History  Procedure Date  . Thyroidectomy, partial 1988    "then did iodine to remove the rest"  (2013/01/14)  . Sigmoid resection / rectopexy 01-14-13  . Colon surgery   . Tonsillectomy 1951?  Marland Kitchen Cholecystectomy 1990  . Vaginal hysterectomy 1970's    "still have my ovaries" (01-14-2013)  . Dilation and curettage of uterus 1960's    "lots of them; had miscarriages" (14-Jan-2013)  . Transrectal drainage of pelvic abscess 10/27/2012  . 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 Cystoscopy with stent placement 14-Jan-2013    Procedure: CYSTOSCOPY WITH STENT PLACEMENT;  Surgeon: Lindaann Slough, MD;  Location: [facility] OR;  Service: Urology;  Laterality: N/A;    Family History  Problem Relation Age of Onset  . Heart disease Father   . Pneumonia Father   . Hypertension Father   . Hyperlipidemia Father   . Cancer Father     skin  . Stroke Father   . Cancer Paternal Aunt   . Alzheimer's disease Mother   . Heart disease Mother   . Emphysema Brother     marijuana and cigarettes  . Alcohol abuse Brother   . Hearing loss Brother   . Diabetes Maternal Grandmother   . Alzheimer's disease Paternal Grandmother   . Cancer Paternal Grandmother     lung?-  smoker  . Hyperlipidemia Paternal Grandmother   . Heart attack Paternal Grandfather   . Alcohol abuse Paternal Grandfather     History   Social History  . Marital Status: Married    Spouse Name: N/A    Number of Children: N/A  . Years of Education: N/A   Occupational History  . Not on file.   Social History Main Topics  . Smoking status: Never Smoker   . Smokeless tobacco: Never Used  . Alcohol Use: Yes     Comment: 12/19/2012 "glass of wine q night q other month or so"  . Drug Use: No  . Sexually Active: No   Other Topics Concern  . Not on file   Social History Narrative   Married children    Current Outpatient Prescriptions on File Prior to Visit  Medication Sig Dispense Refill  . aspirin EC 81 MG tablet Take 81 mg by  mouth daily. Hold 5 days prior to procedure      . etodolac (LODINE) 400 MG tablet Take 400 mg by mouth 2 (two) times daily.       Marland Kitchen HYDROcodone-acetaminophen (NORCO/VICODIN) 5-325 MG per tablet Take 1-2 tablets by mouth every 4 (four) hours as needed.  30 tablet  1  . hydrocortisone (ANUSOL-HC) 2.5 % rectal cream Place rectally 2 (two) times daily. Apply to affected area twice daily  30 g  0  . Krill Oil 300 MG CAPS Take 300 mg by mouth daily. Hold 5 days prior to procedure      . levothyroxine (SYNTHROID, LEVOTHROID) 150 MCG tablet Take 150-225 mcg by mouth daily. 1 tablet (150 mcg) every day except Sunday, 1.5 tablet (225 mcg) on Sunday      . nitroGLYCERIN (NITROSTAT) 0.4 MG SL tablet Place 0.4 mg under the tongue every 5 (five) minutes as needed. For chest pain      . omeprazole (PRILOSEC OTC) 20 MG tablet Take 20 mg by mouth daily before breakfast.       . psyllium (REGULOID) 0.52 G capsule Take 0.52 g by mouth at bedtime.      . simvastatin (ZOCOR) 40 MG tablet Take 40 mg by mouth at bedtime.       . valsartan-hydrochlorothiazide (DIOVAN-HCT) 320-25 MG per tablet Take 1 tablet by mouth daily with breakfast.      . vitamin B-12 (CYANOCOBALAMIN) 1000 MCG tablet Take 1,000 mcg by mouth daily.      . ranitidine (ZANTAC) 150 MG tablet Take 1 tablet (150 mg total) by mouth at bedtime.  30 tablet  3    Allergies  Allergen Reactions  . Niacin Other (See Comments) and Cough    "cough til I threw up" (12/19/2012)  . Neomycin-Bacitracin Zn-Polymyx Rash    Polysporin- is tolerated     Review of Systems  Review of Systems  Constitutional: Positive for fever and malaise/fatigue.  HENT: Negative for congestion.   Eyes: Negative for discharge.  Respiratory: Negative for shortness of breath.   Cardiovascular: Negative for chest pain, palpitations and leg swelling.  Gastrointestinal: Positive for abdominal pain and constipation. Negative for nausea and diarrhea.  Genitourinary: Negative for  dysuria.  Musculoskeletal: Negative for falls.  Skin: Negative for rash.  Neurological: Negative for loss of consciousness and headaches.  Endo/Heme/Allergies: Negative for polydipsia.  Psychiatric/Behavioral: Negative for depression and suicidal ideas. The patient is not nervous/anxious and does not have insomnia.     Objective  BP 118/64  Pulse 85  Temp 97.9 F (36.6  C) (Oral)  Ht 5\' 2"  (1.575 m)  Wt 185 lb (83.915 kg)  BMI 33.84 kg/m2  SpO2 97%  Physical Exam  Physical Exam  Constitutional: She is oriented to person, place, and time and well-developed, well-nourished, and in no distress. No distress.  HENT:  Head: Normocephalic and atraumatic.  Eyes: Conjunctivae normal are normal.  Neck: Neck supple. No thyromegaly present.  Cardiovascular: Normal rate, regular rhythm and normal heart sounds.   No murmur heard. Pulmonary/Chest: Effort normal and breath sounds normal. She has no wheezes.  Abdominal: Soft. Bowel sounds are normal. She exhibits no distension and no mass.       Vertical scar healing well  Musculoskeletal: She exhibits no edema.  Lymphadenopathy:    She has no cervical adenopathy.  Neurological: She is alert and oriented to person, place, and time.  Skin: Skin is warm and dry. No rash noted. She is not diaphoretic.  Psychiatric: Memory, affect and judgment normal.    Lab Results  Component Value Date   TSH 0.299* 07/18/2011   Lab Results  Component Value Date   WBC 8.9 12/23/2012   HGB 10.0* 12/23/2012   HCT 29.9* 12/23/2012   MCV 86.4 12/23/2012   PLT 298 12/23/2012   Lab Results  Component Value Date   CREATININE 0.67 12/23/2012   BUN 6 12/23/2012   NA 133* 12/23/2012   K 4.1 12/23/2012   CL 96 12/23/2012   CO2 26 12/23/2012   Lab Results  Component Value Date   ALT 17 12/08/2012   AST 17 12/08/2012   ALKPHOS 82 12/08/2012   BILITOT 0.3 12/08/2012   Lab Results  Component Value Date   CHOL 150 07/19/2011   Lab Results  Component Value Date     HDL 52 07/19/2011   Lab Results  Component Value Date   LDLCALC 73 07/19/2011   Lab Results  Component Value Date   TRIG 123 07/19/2011   Lab Results  Component Value Date   CHOLHDL 2.9 07/19/2011    A/P  HYPERTENSION Well controlled on DiovanHCT will stop Irbesartan and HCTZ and recheck pressure in 2 weeks or as needed  Colonic diverticular abscess Recovering from surgical debridement. Doing much better has follow up with surgery soon, encouraged daily probiotics, good hydration  G E R D Notes waking up with a dry, irritating throat at times and questions if she should restart Metoclopromide, her bowels are moving well and she describes some mild possibly dyskinetic movements last time she was on it so we will not restart, instead she is encouraged to avoid large meals, especially in pm and will start Ranitidine

## 2013-01-07 NOTE — Assessment & Plan Note (Signed)
Notes waking up with a dry, irritating throat at times and questions if she should restart Metoclopromide, her bowels are moving well and she describes some mild possibly dyskinetic movements last time she was on it so we will not restart, instead she is encouraged to avoid large meals, especially in pm and will start Ranitidine

## 2013-01-09 ENCOUNTER — Ambulatory Visit (INDEPENDENT_AMBULATORY_CARE_PROVIDER_SITE_OTHER): Payer: Medicare Other | Admitting: General Surgery

## 2013-01-09 ENCOUNTER — Ambulatory Visit
Admission: RE | Admit: 2013-01-09 | Discharge: 2013-01-09 | Disposition: A | Discharge status: Home / Self Care | Payer: Medicare Other | Source: Ambulatory Visit | Attending: General Surgery | Admitting: General Surgery

## 2013-01-09 ENCOUNTER — Encounter (INDEPENDENT_AMBULATORY_CARE_PROVIDER_SITE_OTHER): Payer: Self-pay | Admitting: General Surgery

## 2013-01-09 VITALS — BP 116/64 | HR 80 | Resp 16 | Ht 62.0 in | Wt 179.8 lb

## 2013-01-09 DIAGNOSIS — R110 Nausea: Secondary | ICD-10-CM

## 2013-01-09 NOTE — Progress Notes (Signed)
The patient comes in today complaining of nausea. She states that this has been a problem over the last 2 days. Up until that time she had been doing very well. She also complains of reflux at nighttime. She has been taking tramadol to control that although that is not its intended use.  The patient does have some Phenergan at home. I've advised her to take that for her nausea.  On examination her abdomen is nondistended. It is nontender. Pain has not been her complaint. She has good bowel sounds. Her incision looks good with no evidence of infection.  The patient reports having normal bowel movements. They are bit soft but otherwise they're normal and regular daily basis. The patient may have an early partial bowel obstruction. Because of this I will get a three-way abdominal series. Depending on those results further followup will be dictated. I do not think that she requires a routine followup. However if her abdominal films show a concern or problem I will get her back into see me as soon as possible. Also the patient does not improve over the next 2-3 weeks she should make a followup appointment to see me.  I've advised her to decrease her diet to more of a full liquid diet at this time. If she eats any solid foods and should be small amounts. This is until her symptoms have resolved.

## 2013-01-10 ENCOUNTER — Telehealth: Payer: Self-pay | Admitting: General Surgery

## 2013-01-15 ENCOUNTER — Telehealth: Payer: Self-pay | Admitting: *Deleted

## 2013-01-15 NOTE — Telephone Encounter (Signed)
Spoke with pt, she states she has not taken Valsartan/HCTZ since Friday. BP on 01/13/13 108/60, 01/14/13 120/60 and today is 149/71. Pt has follow up with you on 01/18/13.  Please advise.   Caller Name: Dyana Phone: [phone] Patient: Jackie Stevens Gender: Female DOB: 1946-01-09 Age: 67 Years PCP: Danise Edge [facility]) Office Follow Up: Does the office need to follow up with this patient?: Yes Instructions For The Office: HOLD BLOOD PRESSURE MEDICATION, Until advised by physician today. Drink more fluids and increase food intake. Patient complains "nothing sounds good nothing taste good". PLEASE CONTACT PATIENT RN Note: HOLD BLOOD PRESSURE MEDICATION, Until advised by physician today. Drink more fluids and increase food intake. Patient complains "nothing sounds good nothing taste good". PLEASE CONTACT PATIENT Symptoms Reason For Call & Symptoms: Patient states onset of dizziness for three days. She has been having issues with Diverticulitis since November. Hospitalized in January 7th, removal of part of colon and 1/3 of intestines. She states there has been some changes made with her Blood pressure medication while in hosptial.. She is on Valsartan -HCTZ 320/25 taking once daily. (normal medication). She states Blood pressure104/53 and 103/49. +dizzy with standing. Diet is poor oatmeal , 1/2 chicken salad, Fluids-coke 6oz. , coffee x2 cups , water 8 oz Reviewed Health History In EMR: Yes Reviewed Medications In EMR: Yes Reviewed Allergies In EMR: Yes Reviewed Surgeries / Procedures: Yes Date of Onset of Symptoms: 01/09/2013 Guideline(s) Used: High Blood Pressure Dizziness Disposition Per Guideline: Discuss with PCP and Callback by Nurse Today Reason For Disposition Reached: Taking a medicine that could cause dizziness (e.g., blood pressure medications, diuretics) Advice Given: Drink Fluids: Drink several glasses of fruit juice, other clear fluids, or water. This will  improve hydration and blood glucose. If you have a fever or have had heat exposure, make sure the fluids are cold. Some Causes of Temporary Dizziness: Poor Fluid Intake - Not drinking enough fluids and being a little dehydrated is a common cause of temporary dizziness. This is always worse during hot weather. Standing Up Suddenly - Standing up suddenly (especially getting out of bed) or prolonged standing in one place are common causes of temporary dizziness. Not drinking enough fluids always makes it worse. Certain medications can cause or increase this type of dizziness (e.g., blood pressure medications). Heat Exposure - Hot weather, hot tubs, or too much sun exposure are common causes of temporary dizziness. Not drinking enough fluids always makes it worse. Call Back If: Still feel dizzy after 2 hours of rest and fluids Triage Document To [facility] (Daytime Triage) [address] Fax [phone] [place], [place] [address] p. [phone] f. [phone] Taken by Cornell Barman, RN Date 1/31/201415:29:38 Passes out (faints) You become worse. Call Back If: Still feel dizzy after 2 hours of rest and fluids Passes out (faints) You become worse. RN Overrode Recommendation: Follow Up With Office Later HOLD BLOOD PRESSURE MEDICATION, Until advised by physician today. Drink more fluids and increase food intake. Patient complains "nothing sounds good nothing taste good". PLEASE CONTACT PATIENT

## 2013-01-15 NOTE — Telephone Encounter (Signed)
OK to continue to hold med til visit unless numbers go above 160/95. Bring bp log to visit

## 2013-01-15 NOTE — Telephone Encounter (Signed)
Pt informed and voiced understanding

## 2013-01-18 ENCOUNTER — Encounter: Payer: Self-pay | Admitting: Family Medicine

## 2013-01-18 ENCOUNTER — Ambulatory Visit (INDEPENDENT_AMBULATORY_CARE_PROVIDER_SITE_OTHER): Payer: Medicare Other | Admitting: Family Medicine

## 2013-01-18 VITALS — BP 142/68 | HR 84 | Temp 98.3°F | Ht 62.0 in | Wt 187.1 lb

## 2013-01-18 DIAGNOSIS — G4733 Obstructive sleep apnea (adult) (pediatric): Secondary | ICD-10-CM

## 2013-01-18 DIAGNOSIS — R059 Cough, unspecified: Secondary | ICD-10-CM

## 2013-01-18 DIAGNOSIS — R05 Cough: Secondary | ICD-10-CM

## 2013-01-18 DIAGNOSIS — K5720 Diverticulitis of large intestine with perforation and abscess without bleeding: Secondary | ICD-10-CM

## 2013-01-18 DIAGNOSIS — I10 Essential (primary) hypertension: Secondary | ICD-10-CM

## 2013-01-18 DIAGNOSIS — K630 Abscess of intestine: Secondary | ICD-10-CM

## 2013-01-18 NOTE — Patient Instructions (Addendum)
Rel of rec [facility] labs 01/17/13 Try to clean with Tuck's medicated pads, if still seeing spotting check with surgery to see if they will clear you to use an Anusol Parker supppository  Try Pepcid at bed if cough persistent in 2 weeks Hemorrhoids Hemorrhoids are enlarged (dilated) veins around the rectum. There are 2 types of hemorrhoids, and the type of hemorrhoid is determined by its location. Internal hemorrhoids occur in the veins just inside the rectum.They are usually not painful, but they may bleed.However, they may poke through to the outside and become irritated and painful. External hemorrhoids involve the veins outside the anus and can be felt as a painful swelling or hard lump near the anus.They are often itchy and may crack and bleed. Sometimes clots will form in the veins. This makes them swollen and painful. These are called thrombosed hemorrhoids. CAUSES Causes of hemorrhoids include:  Pregnancy. This increases the pressure in the hemorrhoidal veins.  Constipation.  Straining to have a bowel movement.  Obesity.  Heavy lifting or other activity that caused you to strain. TREATMENT Most of the time hemorrhoids improve in 1 to 2 weeks. However, if symptoms do not seem to be getting better or if you have a lot of rectal bleeding, your caregiver may perform a procedure to help make the hemorrhoids get smaller or remove them completely.Possible treatments include:  Rubber band ligation. A rubber band is placed at the base of the hemorrhoid to cut off the circulation.  Sclerotherapy. A chemical is injected to shrink the hemorrhoid.  Infrared light therapy. Tools are used to burn the hemorrhoid.  Hemorrhoidectomy. This is surgical removal of the hemorrhoid. HOME CARE INSTRUCTIONS   Increase fiber in your diet. Ask your caregiver about using fiber supplements.  Drink enough water and fluids to keep your urine clear or pale yellow.  Exercise regularly.  Go  to the bathroom when you have the urge to have a bowel movement. Do not wait.  Avoid straining to have bowel movements.  Keep the anal area dry and clean.  Only take over-the-counter or prescription medicines for pain, discomfort, or fever as directed by your caregiver. If your hemorrhoids are thrombosed:  Take warm sitz baths for 20 to 30 minutes, 3 to 4 times per day.  If the hemorrhoids are very tender and swollen, place ice packs on the area as tolerated. Using ice packs between sitz baths may be helpful. Fill a plastic bag with ice. Place a towel between the bag of ice and your skin.  Medicated creams and suppositories may be used or applied as directed.  Do not use a donut-shaped pillow or sit on the toilet for long periods. This increases blood pooling and pain. SEEK MEDICAL CARE IF:   You have increasing pain and swelling that is not controlled with your medicine.  You have uncontrolled bleeding.  You have difficulty or you are unable to have a bowel movement.  You have pain or inflammation outside the area of the hemorrhoids.  You have chills or an oral temperature above 102 F (38.9 C). MAKE SURE YOU:   Understand these instructions.  Will watch your condition.  Will get help right away if you are not doing well or get worse. Document Released: 11/26/2000 Document Revised: 02/21/2012 Document Reviewed: 11/09/2010 [facility] Patient Information 2013 [place], [place].

## 2013-01-19 ENCOUNTER — Telehealth (INDEPENDENT_AMBULATORY_CARE_PROVIDER_SITE_OTHER): Payer: Self-pay | Admitting: General Surgery

## 2013-01-19 NOTE — Telephone Encounter (Signed)
Pt called to report that she has noted some pink/red streaking on outside of stool over the last 1-1.5 weeks, also noted pink/red tinged drainage on tissue after wiping x1. No fever noted, nausea or vomiting/ eating normal meals/ I reviewed this with Dr. Luisa Hart and he said pt could see Dr. Lindie Spruce early next week unless symptoms increase or change in which case pt will contact our office./Pt aware/gy

## 2013-01-20 ENCOUNTER — Encounter: Payer: Self-pay | Admitting: Family Medicine

## 2013-01-20 NOTE — Progress Notes (Signed)
Patient ID: Jackie Stevens, female   DOB: [birth year], 67 y.o.   MRN: [id number] DANIJAH NOH [id number] 1946-05-31 01/20/2013      Progress Note-Follow Up  Subjective  Chief Complaint  Chief Complaint  Patient presents with  . Follow-up    2 week    HPI  Patient is a 67 year old Caucasian female who is here in followup. She continues to struggle with from diverticular abscess and surgery. She is improving and notes her appetite is somewhat better although she still has not acquired a taste for many foods yet. No fevers or chills. She is moving her bowels most foods but does have to strain at times. Occasionally sees Rydberg limitation. No diarrhea, nausea or vomiting. No chest pain or palpitations. No shortness of breath or GU complaints noted. Does note an intermittent cough. Nonproductive.  Past Medical History  Diagnosis Date  . Allergic rhinitis   . GERD (gastroesophageal reflux disease)   . Hyperlipidemia   . Chicken pox as a child  . Measles as a child  . Insomnia   . Thyroid disease   . Constipation   . Diverticulitis 10/25/2012    pt. reports that a drain was placed - 09/2012    . Abnormal cervical cytology 10/25/2012    Follows with Dr Maggie Font of Gyn  . Asthma   . Heart murmur   . Complication of anesthesia   . PONV (postoperative nausea and vomiting)   . HTN (hypertension)     stress test completed by Walker Shadow, diagnosed as GERD  . Thyroid cancer 1980's  . Hypothyroidism   . H/O hiatal hernia   . External hemorrhoid, bleeding     "sometimes" (01/13/2013)  . Arthritis     "knees; right thumb; shoulders" (01/13/2013)  . Kidney stones 1970's    "passed on their own" (Jan 13, 2013)  . OSA on CPAP     Past Surgical History  Procedure Laterality Date  . Thyroidectomy, partial  1988    "then did iodine to remove the rest" (January 13, 2013)  . Sigmoid resection / rectopexy  2013-01-13  . Colon surgery    . Tonsillectomy  1951?  Marland Kitchen Cholecystectomy  1990  . Vaginal  hysterectomy  1970's    "still have my ovaries" (01-13-13)  . Dilation and curettage of uterus  1960's    "lots of them; had miscarriages" (01/13/2013)  . Transrectal drainage of pelvic abscess  10/27/2012  . Colostomy revision  January 13, 2013    Procedure: COLON RESECTION SIGMOID;  Surgeon: Cherylynn Ridges, MD;  Location: [facility] OR;  Service: General;  Laterality: N/A;  . Cystoscopy with stent placement  01-13-2013    Procedure: CYSTOSCOPY WITH STENT PLACEMENT;  Surgeon: Lindaann Slough, MD;  Location: [facility] OR;  Service: Urology;  Laterality: N/A;    Family History  Problem Relation Age of Onset  . Heart disease Father   . Pneumonia Father   . Hypertension Father   . Hyperlipidemia Father   . Cancer Father     skin  . Stroke Father   . Cancer Paternal Aunt   . Alzheimer's disease Mother   . Heart disease Mother   . Emphysema Brother     marijuana and cigarettes  . Alcohol abuse Brother   . Hearing loss Brother   . Diabetes Maternal Grandmother   . Alzheimer's disease Paternal Grandmother   . Cancer Paternal Grandmother     lung?- smoker  . Hyperlipidemia Paternal Grandmother   . Heart attack Paternal Grandfather   .  Alcohol abuse Paternal Grandfather     History   Social History  . Marital Status: Married    Spouse Name: N/A    Number of Children: N/A  . Years of Education: N/A   Occupational History  . Not on file.   Social History Main Topics  . Smoking status: Never Smoker   . Smokeless tobacco: Never Used  . Alcohol Use: Yes     Comment: 12/19/2012 "glass of wine q night q other month or so"  . Drug Use: No  . Sexually Active: No   Other Topics Concern  . Not on file   Social History Narrative   Married    children    Current Outpatient Prescriptions on File Prior to Visit  Medication Sig Dispense Refill  . aspirin EC 81 MG tablet Take 81 mg by mouth daily. Hold 5 days prior to procedure      . etodolac (LODINE) 400 MG tablet Take 400 mg by mouth 2 (two) times  daily.       . hydrocortisone (ANUSOL-HC) 2.5 % rectal cream Place rectally 2 (two) times daily. Apply to affected area twice daily  30 g  0  . Krill Oil 300 MG CAPS Take 300 mg by mouth daily. Hold 5 days prior to procedure      . levothyroxine (SYNTHROID, LEVOTHROID) 150 MCG tablet Take 150-225 mcg by mouth daily. 1 tablet (150 mcg) every day except Sunday, 1.5 tablet (225 mcg) on Sunday      . omeprazole (PRILOSEC OTC) 20 MG tablet Take 20 mg by mouth daily before breakfast.       . psyllium (REGULOID) 0.52 G capsule Take 0.52 g by mouth at bedtime.      . ranitidine (ZANTAC) 150 MG tablet Take 1 tablet (150 mg total) by mouth at bedtime.  30 tablet  3  . simvastatin (ZOCOR) 40 MG tablet Take 40 mg by mouth at bedtime.       . vitamin B-12 (CYANOCOBALAMIN) 1000 MCG tablet Take 1,000 mcg by mouth daily.      . nitroGLYCERIN (NITROSTAT) 0.4 MG SL tablet Place 0.4 mg under the tongue every 5 (five) minutes as needed. For chest pain       No current facility-administered medications on file prior to visit.    Allergies  Allergen Reactions  . Niacin Other (See Comments) and Cough    "cough til I threw up" (12/19/2012)  . Neomycin-Bacitracin Zn-Polymyx Rash    Polysporin- is tolerated     Review of Systems  Review of Systems  Constitutional: Negative for fever and malaise/fatigue.  HENT: Negative for congestion.   Eyes: Negative for pain and discharge.  Respiratory: Positive for cough. Negative for sputum production and shortness of breath.   Cardiovascular: Negative for chest pain, palpitations and leg swelling.  Gastrointestinal: Positive for heartburn, constipation and blood in stool. Negative for nausea, abdominal pain and diarrhea.  Genitourinary: Negative for dysuria.  Musculoskeletal: Negative for falls.  Skin: Negative for rash.  Neurological: Negative for loss of consciousness and headaches.  Endo/Heme/Allergies: Negative for polydipsia.  Psychiatric/Behavioral: Negative for  depression and suicidal ideas. The patient is not nervous/anxious and does not have insomnia.     Objective  BP 142/68  Pulse 84  Temp(Src) 98.3 F (36.8 C) (Oral)  Ht 5\' 2"  (1.575 m)  Wt 187 lb 1.3 oz (84.859 kg)  BMI 34.21 kg/m2  SpO2 98%  Physical Exam  Physical Exam  Constitutional: She is oriented  to person, place, and time and well-developed, well-nourished, and in no distress. No distress.  HENT:  Head: Normocephalic and atraumatic.  Eyes: Conjunctivae are normal.  Neck: Neck supple. No thyromegaly present.  Cardiovascular: Normal rate, regular rhythm and normal heart sounds.   Pulmonary/Chest: Effort normal and breath sounds normal. She has no wheezes.  Abdominal: She exhibits no distension and no mass.  Musculoskeletal: She exhibits no edema.  Lymphadenopathy:    She has no cervical adenopathy.  Neurological: She is alert and oriented to person, place, and time.  Skin: Skin is warm and dry. No rash noted. She is not diaphoretic.  Psychiatric: Memory, affect and judgment normal.    Lab Results  Component Value Date   TSH 0.299* 07/18/2011   Lab Results  Component Value Date   WBC 8.9 12/23/2012   HGB 10.0* 12/23/2012   HCT 29.9* 12/23/2012   MCV 86.4 12/23/2012   PLT 298 12/23/2012   Lab Results  Component Value Date   CREATININE 0.67 12/23/2012   BUN 6 12/23/2012   NA 133* 12/23/2012   K 4.1 12/23/2012   CL 96 12/23/2012   CO2 26 12/23/2012   Lab Results  Component Value Date   ALT 17 12/08/2012   AST 17 12/08/2012   ALKPHOS 82 12/08/2012   BILITOT 0.3 12/08/2012   Lab Results  Component Value Date   CHOL 150 07/19/2011   Lab Results  Component Value Date   HDL 52 07/19/2011   Lab Results  Component Value Date   LDLCALC 73 07/19/2011   Lab Results  Component Value Date   TRIG 123 07/19/2011   Lab Results  Component Value Date   CHOLHDL 2.9 07/19/2011     Assessment & Plan  Colonic diverticular abscess Is recoverin g from some significant illness  and surgery. She has noted a small amount of blood upon wiping at times, encouraged probiotics, fiber, fluids, try Tuck's medicated prn and check in with surgery if persists.  HYPERTENSION Adequately controlled no changes today  OBSTRUCTIVE SLEEP APNEA Uses her CPAP routinely  Cough Persistent but improved. Avoid spicy and fatty foods, take reflux precautions, report if worsens.

## 2013-01-20 NOTE — Assessment & Plan Note (Signed)
Uses her CPAP routinely

## 2013-01-20 NOTE — Assessment & Plan Note (Signed)
Adequately controlled no changes today

## 2013-01-20 NOTE — Assessment & Plan Note (Signed)
Is recoverin g from some significant illness and surgery. She has noted a small amount of blood upon wiping at times, encouraged probiotics, fiber, fluids, try Tuck's medicated prn and check in with surgery if persists.

## 2013-01-20 NOTE — Assessment & Plan Note (Signed)
Persistent but improved. Avoid spicy and fatty foods, take reflux precautions, report if worsens.

## 2013-01-23 ENCOUNTER — Ambulatory Visit (INDEPENDENT_AMBULATORY_CARE_PROVIDER_SITE_OTHER): Payer: Medicare Other | Admitting: General Surgery

## 2013-01-23 ENCOUNTER — Encounter (INDEPENDENT_AMBULATORY_CARE_PROVIDER_SITE_OTHER): Payer: Self-pay | Admitting: General Surgery

## 2013-01-23 VITALS — BP 154/82 | HR 76 | Temp 97.6°F | Resp 12 | Ht 62.0 in | Wt 188.8 lb

## 2013-01-23 DIAGNOSIS — K648 Other hemorrhoids: Secondary | ICD-10-CM

## 2013-01-23 MED ORDER — HYDROCORTISONE ACETATE 25 MG RE SUPP: 25.0000 mg | Freq: Two times a day (BID) | RECTAL | Status: DC

## 2013-01-23 NOTE — Progress Notes (Signed)
The patient comes in complaining of blood in her stools and also leaking into the bowl after a bowel movement.  She describes the blood is dark brown were burgundy in color not bright red. There is blood on the toilet tissue when she wipes and also that the symptoms do all appear  On examination today on digital exam I was able to remove some stool that appeared to be slightly blood stained. On anoscopy the patient had normal looking stool but some grade 2 hemorrhoids which were not bleeding at this time.  It appears as though the patient may have had some bleeding from her proximal prior anastomosis for diverticular disease but nothing that appears to be need to be treated surgically. The patient does have more proximal diverticular disease but she is currently eating well and going to the bathroom well with no evidence of an action. Her bowel movements or 4 times a day and without pain or discomfort.  I suspect the bleeding may be coming from hemorrhoids but if it is more proximal and more proximal diverticular disease and should be self limited and not require further surgery. We treated her with 2 weeks of Anusol a.c. Suppositories and see her back here in the early part of March.

## 2013-01-26 ENCOUNTER — Encounter: Payer: Self-pay | Admitting: Family Medicine

## 2013-01-29 NOTE — Telephone Encounter (Signed)
Please advise 

## 2013-02-07 NOTE — Telephone Encounter (Signed)
Please advise 

## 2013-02-07 NOTE — Telephone Encounter (Signed)
I left a message on Jackie Stevens's vm with medical records.

## 2013-02-13 ENCOUNTER — Encounter (INDEPENDENT_AMBULATORY_CARE_PROVIDER_SITE_OTHER): Payer: Medicare Other | Admitting: General Surgery

## 2013-02-19 ENCOUNTER — Ambulatory Visit (INDEPENDENT_AMBULATORY_CARE_PROVIDER_SITE_OTHER): Payer: Medicare Other | Admitting: General Surgery

## 2013-02-19 ENCOUNTER — Encounter (INDEPENDENT_AMBULATORY_CARE_PROVIDER_SITE_OTHER): Payer: Self-pay | Admitting: General Surgery

## 2013-02-19 VITALS — BP 132/84 | HR 79 | Temp 98.0°F | Resp 18 | Ht 64.0 in | Wt 184.0 lb

## 2013-02-19 DIAGNOSIS — K625 Hemorrhage of anus and rectum: Secondary | ICD-10-CM

## 2013-02-19 NOTE — Progress Notes (Signed)
The patient is doing much better. Since she was seen in clinic the last time she's had no further bleeding. She has 3 Anusol suppositories left. I told her to continue to take those until they were done.  She is to return to see me on an as-needed basis. Right now we recommended that she get a colonoscopy in approximately one year. She was scheduled to have one just prior to her being admitted for the severe perforated acute diverticulitis. Because of the surgery and the anastomosis we will postpone that for about a year.

## 2013-03-05 ENCOUNTER — Telehealth: Payer: Self-pay | Admitting: Family Medicine

## 2013-03-05 MED ORDER — VALSARTAN-HYDROCHLOROTHIAZIDE 320-25 MG PO TABS: 1.0000 | ORAL_TABLET | Freq: Every day | ORAL | Status: DC

## 2013-03-05 NOTE — Telephone Encounter (Signed)
Double checked with patient and she states she is still taking the Diovan 320-25

## 2013-03-05 NOTE — Telephone Encounter (Signed)
Refill on valsartin hctz 320-25mg  qty 90

## 2013-03-19 ENCOUNTER — Ambulatory Visit: Payer: Medicare Other | Admitting: Family Medicine

## 2013-03-20 ENCOUNTER — Encounter: Payer: Self-pay | Admitting: Family Medicine

## 2013-03-20 ENCOUNTER — Other Ambulatory Visit: Payer: Self-pay | Admitting: Family Medicine

## 2013-03-23 ENCOUNTER — Telehealth: Payer: Self-pay | Admitting: Family Medicine

## 2013-03-23 NOTE — Telephone Encounter (Signed)
Patient Information:  Caller Name: Jackie Stevens  Phone: [phone]  Patient: Jackie, Stevens  Gender: Female  DOB: [birth year]  Age: 67 Years  PCP: Danise Edge [facility])  Office Follow Up:  Does the office need to follow up with this patient?: Yes  Instructions For The Office: Patient triages to be seen today.  No appt available.  Please contact patient at [phone] or  [phone]   Symptoms  Reason For Call & Symptoms: Patient is calling stating "My urine is cloudy and smells like sulfur" along with pain at end of urination.  Last Uop- 1 hour ago  Reviewed Health History In EMR: Yes  Reviewed Medications In EMR: Yes  Reviewed Allergies In EMR: Yes  Reviewed Surgeries / Procedures: Yes  Date of Onset of Symptoms: 03/16/2013  Guideline(s) Used:  Urination Pain - Female  Disposition Per Guideline:   See Today in Office  Reason For Disposition Reached:   Age > 50 years  Advice Given:  Fluids:   Drink extra fluids. Drink 8-10 glasses of liquids a day (Reason: to produce a dilute, non-irritating urine).  Cranberry Juice:   Some people think that drinking cranberry juice may help in fighting urinary tract infections. However, there is no good research that has ever proved this.  Dosage Cranberry Juice Cocktail: 8 oz (240 ml) twice a day.  Warm Saline SITZ Baths to Reduce Pain:  Sit in a warm saline bath for 20 minutes to cleanse the area and to reduce pain. Add 2 oz. of table salt or baking soda to a tub of water.  Call Back If:  You become worse.  Patient Will Follow Care Advice:  YES

## 2013-03-23 NOTE — Telephone Encounter (Signed)
Spoke with pt and advised her of need to be seen. Transferred pt to front office to arrange appt in [facility].

## 2013-03-24 ENCOUNTER — Encounter: Payer: Self-pay | Admitting: Family Medicine

## 2013-03-24 ENCOUNTER — Ambulatory Visit (INDEPENDENT_AMBULATORY_CARE_PROVIDER_SITE_OTHER): Payer: Medicare Other | Admitting: Family Medicine

## 2013-03-24 VITALS — BP 132/80 | HR 72 | Temp 98.3°F | Ht 62.0 in | Wt 187.0 lb

## 2013-03-24 DIAGNOSIS — R300 Dysuria: Secondary | ICD-10-CM

## 2013-03-24 LAB — POCT URINALYSIS DIPSTICK
Bilirubin, UA: NEGATIVE
Blood, UA: NEGATIVE
Glucose, UA: NEGATIVE
Ketones, UA: NEGATIVE
Nitrite, UA: NEGATIVE
Protein, UA: NEGATIVE
Spec Grav, UA: 1.01
Urobilinogen, UA: NEGATIVE
pH, UA: 5

## 2013-03-24 MED ORDER — CIPROFLOXACIN HCL 500 MG PO TABS: 500.0000 mg | ORAL_TABLET | Freq: Two times a day (BID) | ORAL | Status: DC

## 2013-03-24 NOTE — Progress Notes (Signed)
  Subjective:    Jackie Stevens is a 67 y.o. female who complains of abnormal smelling urine, burning with urination, frequency and suprapubic pressure. She has had symptoms for 1 week. Patient also complains of none. Patient denies back pain, congestion, cough, fever, headache, rhinitis, sorethroat, stomach ache and vaginal discharge. Patient does not have a history of recurrent UTI. Patient does not have a history of pyelonephritis.   The following portions of the patient's history were reviewed and updated as appropriate: allergies, current medications, past family history, past medical history, past social history, past surgical history and problem list.  Review of Systems Pertinent items are noted in HPI.    Objective:    BP 132/80  Pulse 72  Temp(Src) 98.3 F (36.8 C) (Oral)  Ht 5\' 2"  (1.575 m)  Wt 187 lb (84.823 kg)  BMI 34.19 kg/m2  SpO2 96% General appearance: alert, cooperative, appears stated age and no distress  Laboratory:  Urine dipstick: 3+ for leukocyte esterase.   Micro exam: not done.    Assessment:    dysuria     Plan:    Medications: ciprofloxacin. Maintain adequate hydration. Follow up if symptoms not improving, and as needed.

## 2013-03-24 NOTE — Patient Instructions (Addendum)
Urinary Tract Infection Urinary tract infections (UTIs) can develop anywhere along your urinary tract. Your urinary tract is your body's drainage system for removing wastes and extra water. Your urinary tract includes two kidneys, two ureters, a bladder, and a urethra. Your kidneys are a pair of bean-shaped organs. Each kidney is about the size of your fist. They are located below your ribs, one on each side of your spine. CAUSES Infections are caused by microbes, which are microscopic organisms, including fungi, viruses, and bacteria. These organisms are so small that they can only be seen through a microscope. Bacteria are the microbes that most commonly cause UTIs. SYMPTOMS  Symptoms of UTIs may vary by age and gender of the patient and by the location of the infection. Symptoms in young women typically include a frequent and intense urge to urinate and a painful, burning feeling in the bladder or urethra during urination. Older women and men are more likely to be tired, shaky, and weak and have muscle aches and abdominal pain. A fever may mean the infection is in your kidneys. Other symptoms of a kidney infection include pain in your back or sides below the ribs, nausea, and vomiting. DIAGNOSIS To diagnose a UTI, your caregiver will ask you about your symptoms. Your caregiver also will ask to provide a urine sample. The urine sample will be tested for bacteria and white blood cells. White blood cells are made by your body to help fight infection. TREATMENT  Typically, UTIs can be treated with medication. Because most UTIs are caused by a bacterial infection, they usually can be treated with the use of antibiotics. The choice of antibiotic and length of treatment depend on your symptoms and the type of bacteria causing your infection. HOME CARE INSTRUCTIONS  If you were prescribed antibiotics, take them exactly as your caregiver instructs you. Finish the medication even if you feel better after you  have only taken some of the medication.  Drink enough water and fluids to keep your urine clear or pale yellow.  Avoid caffeine, tea, and carbonated beverages. They tend to irritate your bladder.  Empty your bladder often. Avoid holding urine for long periods of time.  Empty your bladder before and after sexual intercourse.  After a bowel movement, women should cleanse from front to back. Use each tissue only once. SEEK MEDICAL CARE IF:   You have back pain.  You develop a fever.  Your symptoms do not begin to resolve within 3 days. SEEK IMMEDIATE MEDICAL CARE IF:   You have severe back pain or lower abdominal pain.  You develop chills.  You have nausea or vomiting.  You have continued burning or discomfort with urination. MAKE SURE YOU:   Understand these instructions.  Will watch your condition.  Will get help right away if you are not doing well or get worse. Document Released: 09/08/2005 Document Revised: 05/30/2012 Document Reviewed: 01/07/2012 [employer] Patient Information 2013 [employer]  

## 2013-04-02 ENCOUNTER — Ambulatory Visit (INDEPENDENT_AMBULATORY_CARE_PROVIDER_SITE_OTHER): Payer: Medicare Other | Admitting: Family Medicine

## 2013-04-02 ENCOUNTER — Encounter: Payer: Self-pay | Admitting: Family Medicine

## 2013-04-02 VITALS — BP 122/64 | HR 71 | Temp 98.2°F | Ht 62.0 in | Wt 189.0 lb

## 2013-04-02 DIAGNOSIS — J4520 Mild intermittent asthma, uncomplicated: Secondary | ICD-10-CM

## 2013-04-02 DIAGNOSIS — I10 Essential (primary) hypertension: Secondary | ICD-10-CM

## 2013-04-02 DIAGNOSIS — K5720 Diverticulitis of large intestine with perforation and abscess without bleeding: Secondary | ICD-10-CM

## 2013-04-02 DIAGNOSIS — J45909 Unspecified asthma, uncomplicated: Secondary | ICD-10-CM

## 2013-04-02 DIAGNOSIS — J309 Allergic rhinitis, unspecified: Secondary | ICD-10-CM

## 2013-04-02 DIAGNOSIS — E785 Hyperlipidemia, unspecified: Secondary | ICD-10-CM

## 2013-04-02 DIAGNOSIS — T7840XA Allergy, unspecified, initial encounter: Secondary | ICD-10-CM

## 2013-04-02 DIAGNOSIS — N390 Urinary tract infection, site not specified: Secondary | ICD-10-CM

## 2013-04-02 DIAGNOSIS — K630 Abscess of intestine: Secondary | ICD-10-CM

## 2013-04-02 DIAGNOSIS — D649 Anemia, unspecified: Secondary | ICD-10-CM

## 2013-04-02 HISTORY — DX: Urinary tract infection, site not specified: N39.0

## 2013-04-02 HISTORY — DX: Mild intermittent asthma, uncomplicated: J45.20

## 2013-04-02 LAB — CBC
HCT: 31.3 % — ABNORMAL LOW (ref 36.0–46.0)
Hemoglobin: 10.6 g/dL — ABNORMAL LOW (ref 12.0–15.0)
MCH: 28.5 pg (ref 26.0–34.0)
MCHC: 33.9 g/dL (ref 30.0–36.0)
MCV: 84.1 fL (ref 78.0–100.0)
Platelets: 291 10*3/uL (ref 150–400)
RBC: 3.72 MIL/uL — ABNORMAL LOW (ref 3.87–5.11)
RDW: 14 % (ref 11.5–15.5)
WBC: 4.9 10*3/uL (ref 4.0–10.5)

## 2013-04-02 LAB — URINALYSIS
Glucose, UA: NEGATIVE mg/dL
Hgb urine dipstick: NEGATIVE
Ketones, ur: NEGATIVE mg/dL
Leukocytes, UA: NEGATIVE
Nitrite: NEGATIVE
Protein, ur: NEGATIVE mg/dL
Specific Gravity, Urine: 1.015 (ref 1.005–1.030)
Urobilinogen, UA: 0.2 mg/dL (ref 0.0–1.0)
pH: 5.5 (ref 5.0–8.0)

## 2013-04-02 MED ORDER — ALBUTEROL SULFATE HFA 108 (90 BASE) MCG/ACT IN AERS: 2.0000 | INHALATION_SPRAY | Freq: Four times a day (QID) | RESPIRATORY_TRACT | Status: DC | PRN

## 2013-04-02 MED ORDER — FLUTICASONE PROPIONATE 50 MCG/ACT NA SUSP: 2.0000 | Freq: Every day | NASAL | Status: DC

## 2013-04-02 MED ORDER — VALSARTAN-HYDROCHLOROTHIAZIDE 320-25 MG PO TABS: 1.0000 | ORAL_TABLET | Freq: Every day | ORAL | Status: DC

## 2013-04-02 MED ORDER — CETIRIZINE HCL 10 MG PO TABS: 10.0000 mg | ORAL_TABLET | Freq: Every day | ORAL | Status: DC

## 2013-04-02 MED ORDER — METHYLPREDNISOLONE 4 MG PO KIT: PACK | ORAL | Status: DC

## 2013-04-02 MED ORDER — NITROGLYCERIN 0.4 MG SL SUBL: 0.4000 mg | SUBLINGUAL_TABLET | SUBLINGUAL | Status: DC | PRN

## 2013-04-02 NOTE — Assessment & Plan Note (Signed)
Started on Zyrtec and flonase daily

## 2013-04-02 NOTE — Assessment & Plan Note (Signed)
Doing much better bowels moving well, continue fiber and probiotics

## 2013-04-02 NOTE — Progress Notes (Signed)
Patient ID: Jackie Stevens, female   DOB: 06-07-1946, 67 y.o.   MRN: [id number] KATARYNA MCQUILKIN [id number] May 16, 1946 04/02/2013      Progress Note-Follow Up  Subjective  Chief Complaint  Chief Complaint  Patient presents with  . Follow-up    HPI  Patient is a 67 year old Caucasian female who is in today noting some recent flare in allergies and asthmatic symptoms. She technologist years ago sertraline asthma but had been doing very well for several years so has not talked to tell us previously she notes head congestion itchy watery eyes and nose. Then from there she began having trouble with sneezing and coughing. Notes she has tightness in her chest frequently. Denies any fevers or chills. Has clear rhinorrhea but no green rhinorrhea. Denies headaches, chest pain, palpitations, shortness of breath, GU concerns at this time. Does note she had a recent flare in her reflux despite using Prilosec in the morning and ranitidine at night. His cancer Fridays when she needs to close to bedtime or eats fatty or spicy food. Tums has been helpful as far to eliminate her symptoms  Past Medical History  Diagnosis Date  . Allergic rhinitis   . GERD (gastroesophageal reflux disease)   . Hyperlipidemia   . Chicken pox as a child  . Measles as a child  . Insomnia   . Thyroid disease   . Constipation   . Diverticulitis 10/25/2012    pt. reports that a drain was placed - 09/2012    . Abnormal cervical cytology 10/25/2012    Follows with Dr Maggie Font of Gyn  . Asthma   . Heart murmur   . Complication of anesthesia   . PONV (postoperative nausea and vomiting)   . HTN (hypertension)     stress test completed by Walker Shadow, diagnosed as GERD  . Thyroid cancer 1980's  . Hypothyroidism   . H/O hiatal hernia   . External hemorrhoid, bleeding     "sometimes" (12-20-12)  . Arthritis     "knees; right thumb; shoulders" (December 20, 2012)  . Kidney stones 1970's    "passed on their own" (12-20-12)  .  OSA on CPAP   . UTI (urinary tract infection) 04/02/2013    Past Surgical History  Procedure Laterality Date  . Thyroidectomy, partial  1988    "then did iodine to remove the rest" (12/20/12)  . Sigmoid resection / rectopexy  12-20-12  . Colon surgery    . Tonsillectomy  1951?  Marland Kitchen Cholecystectomy  1990  . Vaginal hysterectomy  1970's    "still have my ovaries" (12/20/2012)  . Dilation and curettage of uterus  1960's    "lots of them; had miscarriages" (20-Dec-2012)  . Transrectal drainage of pelvic abscess  10/27/2012  . Colostomy revision  Dec 20, 2012    Procedure: COLON RESECTION SIGMOID;  Surgeon: Cherylynn Ridges, MD;  Location: [facility] OR;  Service: General;  Laterality: N/A;  . Cystoscopy with stent placement  2012/12/20    Procedure: CYSTOSCOPY WITH STENT PLACEMENT;  Surgeon: Lindaann Slough, MD;  Location: [facility] OR;  Service: Urology;  Laterality: N/A;    Family History  Problem Relation Age of Onset  . Heart disease Father   . Pneumonia Father   . Hypertension Father   . Hyperlipidemia Father   . Cancer Father     skin  . Stroke Father   . Cancer Paternal Aunt   . Alzheimer's disease Mother   . Heart disease Mother   . Emphysema Brother  marijuana and cigarettes  . Alcohol abuse Brother   . Hearing loss Brother   . Diabetes Maternal Grandmother   . Alzheimer's disease Paternal Grandmother   . Cancer Paternal Grandmother     lung?- smoker  . Hyperlipidemia Paternal Grandmother   . Heart attack Paternal Grandfather   . Alcohol abuse Paternal Grandfather     History   Social History  . Marital Status: Married    Spouse Name: N/A    Number of Children: N/A  . Years of Education: N/A   Occupational History  . Not on file.   Social History Main Topics  . Smoking status: Never Smoker   . Smokeless tobacco: Never Used  . Alcohol Use: Yes     Comment: 12/19/2012 "glass of wine q night q other month or so"  . Drug Use: No  . Sexually Active: No   Other Topics Concern  .  Not on file   Social History Narrative   Married    children    Current Outpatient Prescriptions on File Prior to Visit  Medication Sig Dispense Refill  . aspirin EC 81 MG tablet Take 81 mg by mouth daily. Hold 5 days prior to procedure      . etodolac (LODINE) 400 MG tablet Take 400 mg by mouth 2 (two) times daily.       Boris Lown Oil 300 MG CAPS Take 300 mg by mouth daily. Hold 5 days prior to procedure      . levothyroxine (SYNTHROID, LEVOTHROID) 150 MCG tablet Take 150-225 mcg by mouth daily. 1 tablet (150 mcg) every day except Sunday, 1.5 tablet (225 mcg) on Sunday      . omeprazole (PRILOSEC OTC) 20 MG tablet Take 20 mg by mouth daily before breakfast.       . psyllium (REGULOID) 0.52 G capsule Take 0.52 g by mouth at bedtime.      . ranitidine (ZANTAC) 150 MG tablet Take 1 tablet (150 mg total) by mouth at bedtime.  30 tablet  3  . simvastatin (ZOCOR) 40 MG tablet Take 40 mg by mouth at bedtime.       . traMADol (ULTRAM) 50 MG tablet Take 50 mg by mouth at bedtime.      . vitamin B-12 (CYANOCOBALAMIN) 1000 MCG tablet Take 1,000 mcg by mouth daily.      . Zinc 50 MG TABS Take by mouth.       No current facility-administered medications on file prior to visit.    Allergies  Allergen Reactions  . Niacin Other (See Comments) and Cough    "cough til I threw up" (12/19/2012)  . Neomycin-Bacitracin Zn-Polymyx Rash    Polysporin- is tolerated     Review of Systems  Review of Systems  Constitutional: Positive for chills and malaise/fatigue. Negative for fever.  HENT: Positive for congestion.   Eyes: Negative for discharge.  Respiratory: Positive for cough and shortness of breath.   Cardiovascular: Negative for chest pain, palpitations and leg swelling.  Gastrointestinal: Negative for nausea, abdominal pain and diarrhea.  Genitourinary: Negative for dysuria.  Musculoskeletal: Negative for falls.  Skin: Negative for rash.  Neurological: Negative for loss of consciousness and  headaches.  Endo/Heme/Allergies: Negative for polydipsia.  Psychiatric/Behavioral: Negative for depression and suicidal ideas. The patient is not nervous/anxious and does not have insomnia.     Objective  BP 122/64  Pulse 71  Temp(Src) 98.2 F (36.8 C) (Oral)  Ht 5\' 2"  (1.575 m)  Wt 189 lb (  85.73 kg)  BMI 34.56 kg/m2  SpO2 99%  Physical Exam  Physical Exam  Constitutional: She is oriented to person, place, and time and well-developed, well-nourished, and in no distress. No distress.  HENT:  Head: Normocephalic and atraumatic.  Eyes: Conjunctivae are normal.  Neck: Neck supple. No thyromegaly present.  Cardiovascular: Normal rate, regular rhythm and normal heart sounds.   No murmur heard. Pulmonary/Chest: Effort normal and breath sounds normal. She has no wheezes.  Abdominal: She exhibits no distension and no mass.  Musculoskeletal: She exhibits no edema.  Lymphadenopathy:    She has no cervical adenopathy.  Neurological: She is alert and oriented to person, place, and time.  Skin: Skin is warm and dry. No rash noted. She is not diaphoretic.  Psychiatric: Memory, affect and judgment normal.    Lab Results  Component Value Date   TSH 0.299* 07/18/2011   Lab Results  Component Value Date   WBC 8.9 12/23/2012   HGB 10.0* 12/23/2012   HCT 29.9* 12/23/2012   MCV 86.4 12/23/2012   PLT 298 12/23/2012   Lab Results  Component Value Date   CREATININE 0.67 12/23/2012   BUN 6 12/23/2012   NA 133* 12/23/2012   K 4.1 12/23/2012   CL 96 12/23/2012   CO2 26 12/23/2012   Lab Results  Component Value Date   ALT 17 12/08/2012   AST 17 12/08/2012   ALKPHOS 82 12/08/2012   BILITOT 0.3 12/08/2012   Lab Results  Component Value Date   CHOL 150 07/19/2011   Lab Results  Component Value Date   HDL 52 07/19/2011   Lab Results  Component Value Date   LDLCALC 73 07/19/2011   Lab Results  Component Value Date   TRIG 123 07/19/2011   Lab Results  Component Value Date   CHOLHDL 2.9  07/19/2011     Assessment & Plan  HYPERTENSION Well controlled on current meds no changes  Colonic diverticular abscess Doing much better bowels moving well, continue fiber and probiotics  UTI (urinary tract infection) Recent episode treated well with Cipro but some mild frequency returning will recheck urinalysis today  HYPERLIPIDEMIA Repeat lipid panel at next visit.  ALLERGIC RHINITIS Started on Zyrtec and flonase daily  Asthma Patient reports history of asthma but no trouble in years, with the sudden pollen it has flared, given an Albuterol inhaler and a medrol dosepak to use only symptoms worsen. No wheezing in office

## 2013-04-02 NOTE — Assessment & Plan Note (Signed)
Recent episode treated well with Cipro but some mild frequency returning will recheck urinalysis today

## 2013-04-02 NOTE — Patient Instructions (Addendum)
Rel of rec dermatology, Dr Rexanne Mano Labs prior to visit, lipid, renal, cbc, tsh, hepatic   Asthma, Adult Asthma is caused by narrowing of the air passages in the lungs. It may be triggered by pollen, dust, animal dander, molds, some foods, respiratory infections, exposure to smoke, exercise, emotional stress or other allergens (things that cause allergic reactions or allergies). Repeat attacks are common. HOME CARE INSTRUCTIONS   Use prescription medications as ordered by your caregiver.  Avoid pollen, dust, animal dander, molds, smoke and other things that cause attacks at home and at work.  You may have fewer attacks if you decrease dust in your home. Electrostatic air cleaners may help.  It may help to replace your pillows or mattress with materials less likely to cause allergies.  Talk to your caregiver about an action plan for managing asthma attacks at home, including, the use of a peak flow meter which measures the severity of your asthma attack. An action plan can help minimize or stop the attack without having to seek medical care.  If you are not on a fluid restriction, drink 8 to 10 glasses of water each day.  Always have a plan prepared for seeking medical attention, including, calling your physician, accessing local emergency care, and calling 911 (in the [place] for a severe attack.  Discuss possible exercise routines with your caregiver.  If animal dander is the cause of asthma, you may need to get rid of pets. SEEK MEDICAL CARE IF:   You have wheezing and shortness of breath even if taking medicine to prevent attacks.  You have muscle aches, chest pain or thickening of sputum.  Your sputum changes from clear or white to yellow, green, gray, or bloody.  You have any problems that may be related to the medicine you are taking (such as a rash, itching, swelling or trouble breathing). SEEK IMMEDIATE MEDICAL CARE IF:   Your usual medicines do not stop your wheezing or  there is increased coughing and/or shortness of breath.  You have increased difficulty breathing.  You have a fever. MAKE SURE YOU:   Understand these instructions.  Will watch your condition.  Will get help right away if you are not doing well or get worse. Document Released: 11/29/2005 Document Revised: 02/21/2012 Document Reviewed: 07/17/2008 [facility] Patient Information 2013 [place], [place].

## 2013-04-02 NOTE — Assessment & Plan Note (Signed)
Patient reports history of asthma but no trouble in years, with the sudden pollen it has flared, given an Albuterol inhaler and a medrol dosepak to use only symptoms worsen. No wheezing in office

## 2013-04-02 NOTE — Assessment & Plan Note (Signed)
Repeat lipid panel at next visit  

## 2013-04-02 NOTE — Assessment & Plan Note (Signed)
Well controlled on current meds no changes 

## 2013-04-03 LAB — HEPATIC FUNCTION PANEL
ALT: 15 U/L (ref 0–35)
AST: 17 U/L (ref 0–37)
Albumin: 4 g/dL (ref 3.5–5.2)
Alkaline Phosphatase: 82 U/L (ref 39–117)
Bilirubin, Direct: 0.1 mg/dL (ref 0.0–0.3)
Indirect Bilirubin: 0.3 mg/dL (ref 0.0–0.9)
Total Bilirubin: 0.4 mg/dL (ref 0.3–1.2)
Total Protein: 6.3 g/dL (ref 6.0–8.3)

## 2013-04-03 LAB — RENAL FUNCTION PANEL
Albumin: 4 g/dL (ref 3.5–5.2)
BUN: 15 mg/dL (ref 6–23)
CO2: 30 mEq/L (ref 19–32)
Calcium: 9.1 mg/dL (ref 8.4–10.5)
Chloride: 99 mEq/L (ref 96–112)
Creat: 0.82 mg/dL (ref 0.50–1.10)
Glucose, Bld: 76 mg/dL (ref 70–99)
Phosphorus: 4.2 mg/dL (ref 2.3–4.6)
Potassium: 4.5 mEq/L (ref 3.5–5.3)
Sodium: 140 mEq/L (ref 135–145)

## 2013-04-03 LAB — URINE CULTURE
Colony Count: NO GROWTH
Organism ID, Bacteria: NO GROWTH

## 2013-04-03 LAB — IBC PANEL
%SAT: 19 % — ABNORMAL LOW (ref 20–55)
TIBC: 290 ug/dL (ref 250–470)
UIBC: 236 ug/dL (ref 125–400)

## 2013-04-03 LAB — IRON: Iron: 54 ug/dL (ref 42–145)

## 2013-04-05 ENCOUNTER — Other Ambulatory Visit: Payer: Self-pay

## 2013-04-05 NOTE — Telephone Encounter (Signed)
Try Naproxen 375 mg po bid prn pain, Disp #60, 2 rf with food. If this works for her we can call in a 90 day supply

## 2013-04-05 NOTE — Telephone Encounter (Signed)
I do not generally prescribe this med long term. It is a hi risk med (for heart, kidney, stomach). I would be willing to prescribe this med for a month til she gets a chance to get it filled elswhere or her and I can try something different.

## 2013-04-05 NOTE — Telephone Encounter (Signed)
Patient left a message that she takes Etodolac 400 mg bid and would like Dr Abner Greenspan to start refilling this? Pt would like a 90 day supply sent to [facility]. Please advise?

## 2013-04-05 NOTE — Telephone Encounter (Signed)
Pt informed and states she would like to try something different. Please advise? Pt informed that we would get back with her tomorrow and she is ok with that.

## 2013-04-05 NOTE — Telephone Encounter (Signed)
Left a message with spouse for patient to return my call

## 2013-04-06 MED ORDER — NAPROXEN 375 MG PO TABS: 375.0000 mg | ORAL_TABLET | Freq: Two times a day (BID) | ORAL | Status: DC

## 2013-04-06 NOTE — Telephone Encounter (Signed)
Pt states to send 90 day supply to [facility]

## 2013-04-06 NOTE — Addendum Note (Signed)
Addended by: Court Joy on: 04/06/2013 08:49 AM   Modules accepted: Orders

## 2013-05-13 ENCOUNTER — Other Ambulatory Visit: Payer: Self-pay | Admitting: Family Medicine

## 2013-06-05 ENCOUNTER — Encounter: Payer: Self-pay | Admitting: Family Medicine

## 2013-06-05 ENCOUNTER — Ambulatory Visit (INDEPENDENT_AMBULATORY_CARE_PROVIDER_SITE_OTHER): Payer: Medicare Other | Admitting: Family Medicine

## 2013-06-05 DIAGNOSIS — S139XXA Sprain of joints and ligaments of unspecified parts of neck, initial encounter: Secondary | ICD-10-CM

## 2013-06-05 DIAGNOSIS — F430 Acute stress reaction: Secondary | ICD-10-CM

## 2013-06-05 DIAGNOSIS — R51 Headache: Secondary | ICD-10-CM

## 2013-06-05 DIAGNOSIS — S161XXA Strain of muscle, fascia and tendon at neck level, initial encounter: Secondary | ICD-10-CM

## 2013-06-05 MED ORDER — ALPRAZOLAM 0.25 MG PO TABS: 0.2500 mg | ORAL_TABLET | Freq: Two times a day (BID) | ORAL | Status: DC | PRN

## 2013-06-05 MED ORDER — TRAMADOL HCL 50 MG PO TABS: 50.0000 mg | ORAL_TABLET | Freq: Four times a day (QID) | ORAL | Status: DC | PRN

## 2013-06-05 MED ORDER — CYCLOBENZAPRINE HCL 10 MG PO TABS: 10.0000 mg | ORAL_TABLET | Freq: Three times a day (TID) | ORAL | Status: DC | PRN

## 2013-06-05 MED ORDER — KETOROLAC TROMETHAMINE 60 MG/2ML IM SOLN
60.0000 mg | Freq: Once | INTRAMUSCULAR | Status: AC
  Administered 2013-06-05: 60 mg via INTRAMUSCULAR

## 2013-06-05 NOTE — Progress Notes (Signed)
  Subjective:    Jackie Stevens is a 67 y.o. female who presents for evaluation of headache. Symptoms began about 1 day ago. Generally, the headaches last about all day - and occur today--- pt woke up with it. The headaches do not seem to be related to any time of day or year.  The patient rates her most severe headaches a 10 on a scale from 1 to 10.  Work attendance or other daily activities are not affected by the headaches. Precipitating factors include: none which have been determined. The headaches are usually not preceded by an aura. Associated neurologic symptoms: decreased physical activity, depression and vomiting x1. The patient denies loss of balance, muscle weakness, numbness of extremities, speech difficulties, vision problems and worsening school/work performance. Home treatment has included acetaminophen, darkening the room and resting with little improvement. Other history includes: nothing pertinent. Family history includes no known family members with significant headaches.  The following portions of the patient's history were reviewed and updated as appropriate: allergies, current medications, past family history, past medical history, past social history, past surgical history and problem list.  Review of Systems Pertinent items are noted in HPI.    Objective:    There were no vitals taken for this visit. General appearance: alert, cooperative, appears stated age and no distress Head: Normocephalic, without obvious abnormality, atraumatic Eyes: conjunctivae/corneas clear. PERRL, EOM's intact. Fundi benign. Ears: normal TM's and external ear canals both ears Neck: no adenopathy, no carotid bruit, no JVD, supple, symmetrical, trachea midline and thyroid not enlarged, symmetric, no tenderness/mass/nodules Lungs: clear to auscultation bilaterally Heart: S1, S2 normal Neurologic: Alert and oriented X 3, normal strength and tone. Normal symmetric reflexes. Normal coordination and  gait    Assessment:    Tension-type headache, episodic    Plan:    Lie in darkened room and apply cold packs as needed for pain. Episodic therapy: Toradol injections as needed and flexeril and pt has ultram at home due to low frequency of pain. Side effect profile discussed in detail. Asked to keep headache diary. Avoid alcohol, caffeine, excessive physical activity, smoking and stressful situations as much as possible. f/u if no relief

## 2013-06-05 NOTE — Patient Instructions (Signed)

## 2013-06-07 ENCOUNTER — Other Ambulatory Visit: Payer: Self-pay

## 2013-06-07 DIAGNOSIS — I10 Essential (primary) hypertension: Secondary | ICD-10-CM

## 2013-06-07 MED ORDER — VALSARTAN-HYDROCHLOROTHIAZIDE 320-25 MG PO TABS: 1.0000 | ORAL_TABLET | Freq: Every day | ORAL | Status: DC

## 2013-06-25 ENCOUNTER — Encounter: Payer: Self-pay | Admitting: Family Medicine

## 2013-07-02 ENCOUNTER — Encounter: Payer: Self-pay | Admitting: Family Medicine

## 2013-07-02 ENCOUNTER — Ambulatory Visit (INDEPENDENT_AMBULATORY_CARE_PROVIDER_SITE_OTHER): Payer: Medicare Other | Admitting: Family Medicine

## 2013-07-02 VITALS — BP 142/82 | HR 68 | Temp 98.2°F | Ht 62.0 in | Wt 193.0 lb

## 2013-07-02 DIAGNOSIS — E785 Hyperlipidemia, unspecified: Secondary | ICD-10-CM

## 2013-07-02 DIAGNOSIS — D649 Anemia, unspecified: Secondary | ICD-10-CM

## 2013-07-02 DIAGNOSIS — M1990 Unspecified osteoarthritis, unspecified site: Secondary | ICD-10-CM

## 2013-07-02 DIAGNOSIS — K5732 Diverticulitis of large intestine without perforation or abscess without bleeding: Secondary | ICD-10-CM

## 2013-07-02 DIAGNOSIS — I10 Essential (primary) hypertension: Secondary | ICD-10-CM

## 2013-07-02 DIAGNOSIS — K219 Gastro-esophageal reflux disease without esophagitis: Secondary | ICD-10-CM

## 2013-07-02 LAB — CBC
HCT: 33.5 % — ABNORMAL LOW (ref 36.0–46.0)
Hemoglobin: 11.3 g/dL — ABNORMAL LOW (ref 12.0–15.0)
MCH: 28.3 pg (ref 26.0–34.0)
MCHC: 33.7 g/dL (ref 30.0–36.0)
MCV: 83.8 fL (ref 78.0–100.0)
Platelets: 275 10*3/uL (ref 150–400)
RBC: 4 MIL/uL (ref 3.87–5.11)
RDW: 14.6 % (ref 11.5–15.5)
WBC: 6.5 10*3/uL (ref 4.0–10.5)

## 2013-07-02 MED ORDER — NAPROXEN 375 MG PO TABS: 375.0000 mg | ORAL_TABLET | Freq: Two times a day (BID) | ORAL | Status: DC

## 2013-07-02 MED ORDER — OMEPRAZOLE MAGNESIUM 20 MG PO TBEC: 20.0000 mg | DELAYED_RELEASE_TABLET | Freq: Every day | ORAL | Status: DC

## 2013-07-02 MED ORDER — RANITIDINE HCL 150 MG PO TABS: 150.0000 mg | ORAL_TABLET | Freq: Every day | ORAL | Status: DC

## 2013-07-02 NOTE — Progress Notes (Signed)
Patient ID: Jackie Stevens, female   DOB: 1946/03/30, 67 y.o.   MRN: [id number] Jackie Stevens [id number] 01-17-1946 07/02/2013      Progress Note-Follow Up  Subjective  Chief Complaint  Chief Complaint  Patient presents with  . Follow-up    3 month    HPI  Patient is a 67 year old Caucasian female who is in today for followup. Her abdominal pain is greatly improved and her diverticulitis is not flared recently. She occasionally has mild discomfort but no bloody or tarry stool. No nausea or vomiting. No recent fevers, headache, chest pain palpitations, shortness of breath, GU complaints are noted. Is taking medications as prescribed. Has been under great deal of stress with her very sick husband but has used very infrequent doses of alprazolam with good effect as needed.  Past Medical History  Diagnosis Date  . Allergic rhinitis   . GERD (gastroesophageal reflux disease)   . Hyperlipidemia   . Chicken pox as a child  . Measles as a child  . Insomnia   . Thyroid disease   . Constipation   . Diverticulitis 10/25/2012    pt. reports that a drain was placed - 09/2012    . Abnormal cervical cytology 10/25/2012    Follows with Dr Maggie Font of Gyn  . Asthma   . Heart murmur   . Complication of anesthesia   . PONV (postoperative nausea and vomiting)   . HTN (hypertension)     stress test completed by Walker Shadow, diagnosed as GERD  . Thyroid cancer 1980's  . Hypothyroidism   . H/O hiatal hernia   . External hemorrhoid, bleeding     "sometimes" (01-14-2013)  . Arthritis     "knees; right thumb; shoulders" (01-14-2013)  . Kidney stones 1970's    "passed on their own" (01/14/13)  . OSA on CPAP   . UTI (urinary tract infection) 04/02/2013    Past Surgical History  Procedure Laterality Date  . Thyroidectomy, partial  1988    "then did iodine to remove the rest" (January 14, 2013)  . Sigmoid resection / rectopexy  Jan 14, 2013  . Colon surgery    . Tonsillectomy  1951?  Marland Kitchen  Cholecystectomy  1990  . Vaginal hysterectomy  1970's    "still have my ovaries" (January 14, 2013)  . Dilation and curettage of uterus  1960's    "lots of them; had miscarriages" (Jan 14, 2013)  . Transrectal drainage of pelvic abscess  10/27/2012  . Colostomy revision  01/14/2013    Procedure: COLON RESECTION SIGMOID;  Surgeon: Cherylynn Ridges, MD;  Location: [facility] OR;  Service: General;  Laterality: N/A;  . Cystoscopy with stent placement  2013/01/14    Procedure: CYSTOSCOPY WITH STENT PLACEMENT;  Surgeon: Lindaann Slough, MD;  Location: [facility] OR;  Service: Urology;  Laterality: N/A;    Family History  Problem Relation Age of Onset  . Heart disease Father   . Pneumonia Father   . Hypertension Father   . Hyperlipidemia Father   . Cancer Father     skin  . Stroke Father   . Cancer Paternal Aunt   . Alzheimer's disease Mother   . Heart disease Mother   . Emphysema Brother     marijuana and cigarettes  . Alcohol abuse Brother   . Hearing loss Brother   . Diabetes Maternal Grandmother   . Alzheimer's disease Paternal Grandmother   . Cancer Paternal Grandmother     lung?- smoker  . Hyperlipidemia Paternal Grandmother   . Heart attack  Paternal Grandfather   . Alcohol abuse Paternal Grandfather     History   Social History  . Marital Status: Married    Spouse Name: N/A    Number of Children: N/A  . Years of Education: N/A   Occupational History  . Not on file.   Social History Main Topics  . Smoking status: Never Smoker   . Smokeless tobacco: Never Used  . Alcohol Use: Yes     Comment: 12/19/2012 "glass of wine q night q other month or so"  . Drug Use: No  . Sexually Active: No   Other Topics Concern  . Not on file   Social History Narrative   Married    children    Current Outpatient Prescriptions on File Prior to Visit  Medication Sig Dispense Refill  . albuterol (PROVENTIL HFA;VENTOLIN HFA) 108 (90 BASE) MCG/ACT inhaler Inhale 2 puffs into the lungs every 6 (six) hours as needed  for wheezing.  1 Inhaler  2  . aspirin EC 81 MG tablet Take 81 mg by mouth daily. Hold 5 days prior to procedure      . cetirizine (ZYRTEC) 10 MG tablet Take 1 tablet (10 mg total) by mouth daily.  30 tablet  11  . cyclobenzaprine (FLEXERIL) 10 MG tablet Take 1 tablet (10 mg total) by mouth 3 (three) times daily as needed for muscle spasms.  30 tablet  0  . fluticasone (FLONASE) 50 MCG/ACT nasal spray Place 2 sprays into the nose daily.  16 g  6  . iron polysaccharides (NIFEREX) 150 MG capsule Take 150 mg by mouth daily.      Marland Kitchen levothyroxine (SYNTHROID, LEVOTHROID) 150 MCG tablet Take 150-225 mcg by mouth daily. 1 tablet (150 mcg) every day except Sunday, 1.5 tablet (225 mcg) on Sunday      . nitroGLYCERIN (NITROSTAT) 0.4 MG SL tablet Place 1 tablet (0.4 mg total) under the tongue every 5 (five) minutes as needed. For chest pain  25 tablet  1  . psyllium (REGULOID) 0.52 G capsule Take 0.52 g by mouth at bedtime.      . simvastatin (ZOCOR) 40 MG tablet Take 40 mg by mouth at bedtime.       . traMADol (ULTRAM) 50 MG tablet Take 1 tablet (50 mg total) by mouth every 6 (six) hours as needed for pain.  90 tablet  1  . valsartan-hydrochlorothiazide (DIOVAN-HCT) 320-25 MG per tablet Take 1 tablet by mouth daily.  90 tablet  1  . vitamin B-12 (CYANOCOBALAMIN) 1000 MCG tablet Take 1,000 mcg by mouth daily.      . Zinc 50 MG TABS Take by mouth.      . ALPRAZolam (XANAX) 0.25 MG tablet Take 1 tablet (0.25 mg total) by mouth 2 (two) times daily as needed for sleep.  30 tablet  0   No current facility-administered medications on file prior to visit.    Allergies  Allergen Reactions  . Niacin Other (See Comments) and Cough    "cough til I threw up" (12/19/2012)  . Neomycin-Bacitracin Zn-Polymyx Rash    Polysporin- is tolerated     Review of Systems  Review of Systems  Constitutional: Positive for malaise/fatigue. Negative for fever.  HENT: Negative for congestion.   Eyes: Negative for pain and  discharge.  Respiratory: Negative for shortness of breath.   Cardiovascular: Negative for chest pain, palpitations and leg swelling.  Gastrointestinal: Positive for abdominal pain. Negative for nausea and diarrhea.  Genitourinary: Negative for dysuria.  Musculoskeletal: Negative for falls.  Skin: Negative for rash.  Neurological: Negative for loss of consciousness and headaches.  Endo/Heme/Allergies: Negative for polydipsia.  Psychiatric/Behavioral: Negative for depression and suicidal ideas. The patient is nervous/anxious. The patient does not have insomnia.     Objective  BP 142/82  Pulse 68  Temp(Src) 98.2 F (36.8 C) (Oral)  Ht 5\' 2"  (1.575 m)  Wt 193 lb (87.544 kg)  BMI 35.29 kg/m2  SpO2 99%  Physical Exam  Physical Exam  Constitutional: She is oriented to person, place, and time and well-developed, well-nourished, and in no distress. No distress.  HENT:  Head: Normocephalic and atraumatic.  Eyes: Conjunctivae are normal.  Neck: Neck supple. No thyromegaly present.  Cardiovascular: Normal rate, regular rhythm and normal heart sounds.   No murmur heard. Pulmonary/Chest: Effort normal and breath sounds normal. She has no wheezes.  Abdominal: She exhibits no distension and no mass.  Musculoskeletal: She exhibits no edema.  Lymphadenopathy:    She has no cervical adenopathy.  Neurological: She is alert and oriented to person, place, and time.  Skin: Skin is warm and dry. No rash noted. She is not diaphoretic.  Psychiatric: Memory, affect and judgment normal.    Lab Results  Component Value Date   TSH 0.299* 07/18/2011   Lab Results  Component Value Date   WBC 4.9 04/02/2013   HGB 10.6* 04/02/2013   HCT 31.3* 04/02/2013   MCV 84.1 04/02/2013   PLT 291 04/02/2013   Lab Results  Component Value Date   CREATININE 0.82 04/02/2013   BUN 15 04/02/2013   NA 140 04/02/2013   K 4.5 04/02/2013   CL 99 04/02/2013   CO2 30 04/02/2013   Lab Results  Component Value Date   ALT  15 04/02/2013   AST 17 04/02/2013   ALKPHOS 82 04/02/2013   BILITOT 0.4 04/02/2013   Lab Results  Component Value Date   CHOL 150 07/19/2011   Lab Results  Component Value Date   HDL 52 07/19/2011   Lab Results  Component Value Date   LDLCALC 73 07/19/2011   Lab Results  Component Value Date   TRIG 123 07/19/2011   Lab Results  Component Value Date   CHOLHDL 2.9 07/19/2011     Assessment & Plan  HYPERTENSION Well controlled, no changes  Diverticulitis of rectosigmoid Doing better at this time. Continue high fiber diet, good hydration and probiotics  HYPERLIPIDEMIA Tolerating simvastatin, avoid trans fats.  G E R D Well controlled on current meds and diet

## 2013-07-02 NOTE — Patient Instructions (Addendum)
DASH Diet  The DASH diet stands for "Dietary Approaches to Stop Hypertension." It is a healthy eating plan that has been shown to reduce high blood pressure (hypertension) in as little as 14 days, while also possibly providing other significant health benefits. These other health benefits include reducing the risk of breast cancer after menopause and reducing the risk of type 2 diabetes, heart disease, colon cancer, and stroke. Health benefits also include weight loss and slowing kidney failure in patients with chronic kidney disease.   DIET GUIDELINES  · Limit salt (sodium). Your diet should contain less than 1500 mg of sodium daily.  · Limit refined or processed carbohydrates. Your diet should include mostly whole grains. Desserts and added sugars should be used sparingly.  · Include small amounts of heart-healthy fats. These types of fats include nuts, oils, and tub margarine. Limit saturated and trans fats. These fats have been shown to be harmful in the body.  CHOOSING FOODS   The following food groups are based on a 2000 calorie diet. See your Registered Dietitian for individual calorie needs.  Grains and Grain Products (6 to 8 servings daily)  · Eat More Often: Whole-wheat bread, brown rice, whole-grain or wheat pasta, quinoa, popcorn without added fat or salt (air popped).  · Eat Less Often: White bread, white pasta, white rice, cornbread.  Vegetables (4 to 5 servings daily)  · Eat More Often: Fresh, frozen, and canned vegetables. Vegetables may be raw, steamed, roasted, or grilled with a minimal amount of fat.  · Eat Less Often/Avoid: Creamed or fried vegetables. Vegetables in a cheese sauce.  Fruit (4 to 5 servings daily)  · Eat More Often: All fresh, canned (in natural juice), or frozen fruits. Dried fruits without added sugar. One hundred percent fruit juice (½ cup [237 mL] daily).  · Eat Less Often: Dried fruits with added sugar. Canned fruit in light or heavy syrup.  Lean Meats, Fish, and Poultry (2  servings or less daily. One serving is 3 to 4 oz [85-114 g]).  · Eat More Often: Ninety percent or leaner ground beef, tenderloin, sirloin. Round cuts of beef, chicken breast, turkey breast. All fish. Grill, bake, or broil your meat. Nothing should be fried.  · Eat Less Often/Avoid: Fatty cuts of meat, turkey, or chicken leg, thigh, or wing. Fried cuts of meat or fish.  Dairy (2 to 3 servings)  · Eat More Often: Low-fat or fat-free milk, low-fat plain or light yogurt, reduced-fat or part-skim cheese.  · Eat Less Often/Avoid: Milk (whole, 2%). Whole milk yogurt. Full-fat cheeses.  Nuts, Seeds, and Legumes (4 to 5 servings per week)  · Eat More Often: All without added salt.  · Eat Less Often/Avoid: Salted nuts and seeds, canned beans with added salt.  Fats and Sweets (limited)  · Eat More Often: Vegetable oils, tub margarines without trans fats, sugar-free gelatin. Mayonnaise and salad dressings.  · Eat Less Often/Avoid: Coconut oils, palm oils, butter, stick margarine, cream, half and half, cookies, candy, pie.  FOR MORE INFORMATION  The Dash Diet Eating Plan: www.dashdiet.org  Document Released: 11/18/2011 Document Revised: 02/21/2012 Document Reviewed: 11/18/2011  ExitCare® Patient Information ©2014 [employer].

## 2013-07-02 NOTE — Assessment & Plan Note (Signed)
Well controlled, no changes 

## 2013-07-02 NOTE — Assessment & Plan Note (Signed)
Doing better at this time. Continue high fiber diet, good hydration and probiotics

## 2013-07-02 NOTE — Assessment & Plan Note (Signed)
Well controlled on current meds and diet.

## 2013-07-02 NOTE — Assessment & Plan Note (Signed)
Tolerating simvastatin, avoid trans fats.

## 2013-07-03 LAB — LIPID PANEL
Cholesterol: 147 mg/dL (ref 0–200)
HDL: 48 mg/dL (ref >39)
LDL Cholesterol: 68 mg/dL (ref 0–99)
Total CHOL/HDL Ratio: 3.1 Ratio
Triglycerides: 154 mg/dL — ABNORMAL HIGH (ref <150)
VLDL: 31 mg/dL (ref 0–40)

## 2013-07-03 LAB — HEPATIC FUNCTION PANEL
ALT: 17 U/L (ref 0–35)
AST: 19 U/L (ref 0–37)
Albumin: 4.2 g/dL (ref 3.5–5.2)
Alkaline Phosphatase: 75 U/L (ref 39–117)
Bilirubin, Direct: 0.1 mg/dL (ref 0.0–0.3)
Indirect Bilirubin: 0.2 mg/dL (ref 0.0–0.9)
Total Bilirubin: 0.3 mg/dL (ref 0.3–1.2)
Total Protein: 6.7 g/dL (ref 6.0–8.3)

## 2013-07-03 LAB — IRON: Iron: 80 ug/dL (ref 42–145)

## 2013-07-03 LAB — RENAL FUNCTION PANEL
Albumin: 4.2 g/dL (ref 3.5–5.2)
BUN: 16 mg/dL (ref 6–23)
CO2: 31 mEq/L (ref 19–32)
Calcium: 9.3 mg/dL (ref 8.4–10.5)
Chloride: 99 mEq/L (ref 96–112)
Creat: 0.73 mg/dL (ref 0.50–1.10)
Glucose, Bld: 93 mg/dL (ref 70–99)
Phosphorus: 4.2 mg/dL (ref 2.3–4.6)
Potassium: 4.4 mEq/L (ref 3.5–5.3)
Sodium: 135 mEq/L (ref 135–145)

## 2013-07-03 LAB — TSH: TSH: 1.015 u[IU]/mL (ref 0.350–4.500)

## 2013-07-03 LAB — IBC PANEL
%SAT: 22 % (ref 20–55)
TIBC: 361 ug/dL (ref 250–470)
UIBC: 281 ug/dL (ref 125–400)

## 2013-07-08 ENCOUNTER — Encounter: Payer: Self-pay | Admitting: Family Medicine

## 2013-07-10 ENCOUNTER — Telehealth: Payer: Self-pay

## 2013-07-10 NOTE — Telephone Encounter (Signed)
I called pt to check with her per MD to see if she still needs an order for [facility] for a power wheelchair?  Pt stated that she is going to look at a used one today and the wheelchair if she gets new is coming from [employer] (?). Pt states if she doesn't like the used one she will have the company she is using fax [place] the paperwork.

## 2013-07-16 ENCOUNTER — Other Ambulatory Visit: Payer: Self-pay

## 2013-07-16 DIAGNOSIS — K219 Gastro-esophageal reflux disease without esophagitis: Secondary | ICD-10-CM

## 2013-07-16 MED ORDER — RANITIDINE HCL 150 MG PO TABS: 150.0000 mg | ORAL_TABLET | Freq: Every day | ORAL | Status: DC

## 2013-07-18 ENCOUNTER — Other Ambulatory Visit: Payer: Self-pay

## 2013-07-25 ENCOUNTER — Ambulatory Visit (INDEPENDENT_AMBULATORY_CARE_PROVIDER_SITE_OTHER): Payer: Medicare Other | Admitting: Internal Medicine

## 2013-07-25 ENCOUNTER — Encounter: Payer: Self-pay | Admitting: Internal Medicine

## 2013-07-25 VITALS — BP 132/76 | HR 66 | Ht 62.0 in | Wt 195.6 lb

## 2013-07-25 DIAGNOSIS — G4733 Obstructive sleep apnea (adult) (pediatric): Secondary | ICD-10-CM

## 2013-07-25 NOTE — Patient Instructions (Addendum)
Order- [facility] Patient would like to see about changing CPAP DME from [facility] due to service and billing issues                           CPAP 10, mask of choice, humidifier, supplies   Dx OSA  Please call as needed

## 2013-07-25 NOTE — Progress Notes (Signed)
Subjective:    Patient ID: Jackie Stevens, female    DOB: [birth year], 67 y.o.   MRN: [id number]  HPI 07/26/11- followed for obstructive sleep apnea complicated by GERD, HBP, allergic rhinitis Last here 01/25/2011 Pending stress test for chest pain.  CPAP 10 [facility]- all night every night. It bothers her enough that she likes to leave the mask off after getting up for bathroom. Husband will tell her that she snores without it. We discussed comfort issues. Mostly she just doesn't like having something on her face.  07/25/12- 65 yoF never smoker followed for obstructive sleep apnea complicated by GERD, HBP, allergic rhinitis Wears CPAP10/[facility]  every night-except when having trouble with sleeping overall.  New complaint-sore throat x5 days. Ears itch without popping. Some watery nose and occasional sneeze with dry cough.  08/25/12- 65 yoF never smoker followed for obstructive sleep apnea complicated by GERD, HBP, allergic rhinitis ACUTE VISIT:dry cough-wheezing. Choke and gag wake her at night. Does not recognize indigestion or reflux. Codeine cough syrup lets her sleep through but does not stop the coughing according to her family. Persistent postnasal drip. Head of bed is elevated on brick. CPAP 10/[facility] is being used but cough interferes. Discussed history of thyroid cancer resected and treated with radioactive iodine 28 years ago.  07/25/13- 66 yoF never smoker followed for obstructive sleep apnea complicated by GERD, HBP, allergic rhinitis FOLLOWS FOR: pt reports wearing CPAP 10/ [facility] every night approx 6 hrs per night, tolerating pressures fine -- c/o occasional  discomfort at nose-- having trouble w [facility] would like to see about switching companies. Stress is affecting her sleep-husband had GI bleed.  Review of Systems-see HPI Constitutional:   No-   weight loss, night sweats, fevers, chills, fatigue, lassitude. HEENT:   No-  headaches, difficulty swallowing, tooth/dental problems,  sore throat,         No-sneezing, itching, ear ache,no- nasal congestion, post nasal drip,  CV:  No-   chest pain, orthopnea, PND, swelling in lower extremities, anasarca, dizziness, palpitations Resp: No-   shortness of breath with exertion or at rest.              No-   productive cough,  No- non-productive cough,  No-  coughing up of blood.              No-   change in color of mucus.  No- wheezing.   Skin: No-   rash or lesions. GI:  No-   heartburn, indigestion, abdominal pain, nausea, vomiting,  GU:  MS:  No-   joint pain or swelling.   Neuro- nothing unusual Psych:  No- change in mood or affect. No depression or anxiety.  No memory loss.   Objective:   Physical Exam General- Alert, Oriented, Affect-appropriate, Distress- none acute  Overweight, talkative Skin- rash-none, lesions- none, excoriation- none Lymphadenopathy- none Head- atraumatic            Eyes- Gross vision intact, PERRLA, conjunctivae clear secretions            Ears- Hearing, canals slightly retracted, no fluid            Nose- Clear, No-Septal dev, mucus, polyps, erosion, perforation             Throat- Mallampati III , mucosa clear- not red , drainage- none, tonsils- atrophic, not hoarse Neck- flexible , trachea midline, no stridor , thyroid nl, carotid no bruit Chest - symmetrical excursion , unlabored  Heart/CV- RRR , no murmur , no gallop  , no rub, nl s1 s2                           - JVD- none , edema- none, stasis changes- none, varices- none           Lung- clear to P&A, wheeze- none, cough- none , dullness-none, rub- none           Chest wall-  Abd-  Br/ Gen/ Rectal- Not done, not indicated Extrem- cyanosis- none, clubbing, none, atrophy- none, strength- nl Neuro- grossly intact to observation   Assessment & Plan:

## 2013-08-12 NOTE — Assessment & Plan Note (Signed)
She complains of problems getting orders straight with [facility] and would like to try a different DME company. Her compliance and control are good with CPAP 10

## 2013-08-30 ENCOUNTER — Ambulatory Visit (INDEPENDENT_AMBULATORY_CARE_PROVIDER_SITE_OTHER): Payer: Medicare Other

## 2013-08-30 DIAGNOSIS — Z23 Encounter for immunization: Secondary | ICD-10-CM

## 2013-09-25 ENCOUNTER — Other Ambulatory Visit: Payer: Self-pay

## 2013-09-25 DIAGNOSIS — R51 Headache: Secondary | ICD-10-CM

## 2013-09-25 MED ORDER — TRAMADOL HCL 50 MG PO TABS: 50.0000 mg | ORAL_TABLET | Freq: Four times a day (QID) | ORAL | Status: DC | PRN

## 2013-09-25 NOTE — Telephone Encounter (Signed)
RX faxed

## 2013-09-25 NOTE — Telephone Encounter (Signed)
Patient left a message stating she would like a 90 day supply of Tramadol sent to [facility]?   Last RX was done on 06-05-13 quantity 90 with 1 refill   Please advise?   If ok fax to [phone]

## 2013-10-02 ENCOUNTER — Ambulatory Visit (INDEPENDENT_AMBULATORY_CARE_PROVIDER_SITE_OTHER): Payer: Medicare Other | Admitting: Family Medicine

## 2013-10-02 ENCOUNTER — Ambulatory Visit (HOSPITAL_BASED_OUTPATIENT_CLINIC_OR_DEPARTMENT_OTHER)
Admission: RE | Admit: 2013-10-02 | Discharge: 2013-10-02 | Disposition: A | Discharge status: Home / Self Care | Payer: Medicare Other | Source: Ambulatory Visit | Attending: Family Medicine | Admitting: Family Medicine

## 2013-10-02 ENCOUNTER — Encounter: Payer: Self-pay | Admitting: Family Medicine

## 2013-10-02 VITALS — BP 130/74 | HR 75 | Temp 98.3°F | Ht 62.0 in | Wt 207.1 lb

## 2013-10-02 DIAGNOSIS — R109 Unspecified abdominal pain: Secondary | ICD-10-CM

## 2013-10-02 DIAGNOSIS — L259 Unspecified contact dermatitis, unspecified cause: Secondary | ICD-10-CM

## 2013-10-02 DIAGNOSIS — Z0000 Encounter for general adult medical examination without abnormal findings: Secondary | ICD-10-CM

## 2013-10-02 DIAGNOSIS — I10 Essential (primary) hypertension: Secondary | ICD-10-CM

## 2013-10-02 DIAGNOSIS — Z9089 Acquired absence of other organs: Secondary | ICD-10-CM | POA: Insufficient documentation

## 2013-10-02 DIAGNOSIS — E785 Hyperlipidemia, unspecified: Secondary | ICD-10-CM

## 2013-10-02 DIAGNOSIS — L309 Dermatitis, unspecified: Secondary | ICD-10-CM

## 2013-10-02 DIAGNOSIS — M79609 Pain in unspecified limb: Secondary | ICD-10-CM

## 2013-10-02 DIAGNOSIS — G4733 Obstructive sleep apnea (adult) (pediatric): Secondary | ICD-10-CM

## 2013-10-02 DIAGNOSIS — Z23 Encounter for immunization: Secondary | ICD-10-CM

## 2013-10-02 DIAGNOSIS — J45909 Unspecified asthma, uncomplicated: Secondary | ICD-10-CM

## 2013-10-02 DIAGNOSIS — M47817 Spondylosis without myelopathy or radiculopathy, lumbosacral region: Secondary | ICD-10-CM | POA: Insufficient documentation

## 2013-10-02 DIAGNOSIS — K5732 Diverticulitis of large intestine without perforation or abscess without bleeding: Secondary | ICD-10-CM

## 2013-10-02 DIAGNOSIS — K5900 Constipation, unspecified: Secondary | ICD-10-CM

## 2013-10-02 DIAGNOSIS — M79644 Pain in right finger(s): Secondary | ICD-10-CM

## 2013-10-02 DIAGNOSIS — D649 Anemia, unspecified: Secondary | ICD-10-CM

## 2013-10-02 DIAGNOSIS — R1032 Left lower quadrant pain: Secondary | ICD-10-CM | POA: Insufficient documentation

## 2013-10-02 DIAGNOSIS — K5792 Diverticulitis of intestine, part unspecified, without perforation or abscess without bleeding: Secondary | ICD-10-CM

## 2013-10-02 LAB — TSH: TSH: 2.213 u[IU]/mL (ref 0.350–4.500)

## 2013-10-02 LAB — CBC
HCT: 33.9 % — ABNORMAL LOW (ref 36.0–46.0)
Hemoglobin: 11.8 g/dL — ABNORMAL LOW (ref 12.0–15.0)
MCH: 30.1 pg (ref 26.0–34.0)
MCHC: 34.8 g/dL (ref 30.0–36.0)
MCV: 86.5 fL (ref 78.0–100.0)
Platelets: 267 10*3/uL (ref 150–400)
RBC: 3.92 MIL/uL (ref 3.87–5.11)
RDW: 14.2 % (ref 11.5–15.5)
WBC: 6.9 10*3/uL (ref 4.0–10.5)

## 2013-10-02 LAB — LIPID PANEL
Cholesterol: 152 mg/dL (ref 0–200)
HDL: 50 mg/dL (ref >39)
LDL Cholesterol: 62 mg/dL (ref 0–99)
Total CHOL/HDL Ratio: 3 Ratio
Triglycerides: 198 mg/dL — ABNORMAL HIGH (ref <150)
VLDL: 40 mg/dL (ref 0–40)

## 2013-10-02 LAB — RENAL FUNCTION PANEL
Albumin: 4.3 g/dL (ref 3.5–5.2)
BUN: 19 mg/dL (ref 6–23)
CO2: 31 mEq/L (ref 19–32)
Calcium: 8.9 mg/dL (ref 8.4–10.5)
Chloride: 97 mEq/L (ref 96–112)
Creat: 0.82 mg/dL (ref 0.50–1.10)
Glucose, Bld: 101 mg/dL — ABNORMAL HIGH (ref 70–99)
Phosphorus: 3.8 mg/dL (ref 2.3–4.6)
Potassium: 4.3 mEq/L (ref 3.5–5.3)
Sodium: 133 mEq/L — ABNORMAL LOW (ref 135–145)

## 2013-10-02 LAB — HEPATIC FUNCTION PANEL
ALT: 23 U/L (ref 0–35)
AST: 22 U/L (ref 0–37)
Albumin: 4.3 g/dL (ref 3.5–5.2)
Alkaline Phosphatase: 73 U/L (ref 39–117)
Bilirubin, Direct: 0.1 mg/dL (ref 0.0–0.3)
Indirect Bilirubin: 0.2 mg/dL (ref 0.0–0.9)
Total Bilirubin: 0.3 mg/dL (ref 0.3–1.2)
Total Protein: 6.6 g/dL (ref 6.0–8.3)

## 2013-10-02 LAB — URIC ACID: Uric Acid, Serum: 5 mg/dL (ref 2.4–7.0)

## 2013-10-02 NOTE — Assessment & Plan Note (Signed)
Moving bowels once to 3 x daily no blood seen but sense of incomplete evacuation is present. Also c/o increase gas ans pain especially in right side of abdomen. Concern for contstipation and consideration of possible abdominal wall hernia, check xray and labs today if no cause identified and patient continues to be symptomatic would refer back to surgeon Dr Lindie Spruce for further consideration

## 2013-10-02 NOTE — Progress Notes (Signed)
Patient ID: Jackie Stevens, female   DOB: [birth year], 67 y.o.   MRN: [id number] Jackie Stevens [id number] 06/18/1946 10/02/2013      Progress Note-Follow Up  Subjective  Chief Complaint  Chief Complaint  Patient presents with  . Follow-up    3 month    HPI  Patient is a 67 year old Caucasian female who is in today for followup. She offers several concerns. One is ongoing arthralgias to see a chiropractor for that nothing new or acute. No falls. He is complaining of some intermittent increased pain in her right abdomen. Says she feels the Ultram at times and the pain worsens. He is moving her bowels but sometimes has to strain. No melena, nausea vomiting or diarrhea. No recent illness, fevers, headaches or shortness of breath. Asthma is well-controlled. Taking medications as prescribed.  Past Medical History  Diagnosis Date  . Allergic rhinitis   . GERD (gastroesophageal reflux disease)   . Hyperlipidemia   . Chicken pox as a child  . Measles as a child  . Insomnia   . Thyroid disease   . Constipation   . Diverticulitis 10/25/2012    pt. reports that a drain was placed - 09/2012    . Abnormal cervical cytology 10/25/2012    Follows with Dr Maggie Font of Gyn  . Asthma   . Heart murmur   . Complication of anesthesia   . PONV (postoperative nausea and vomiting)   . HTN (hypertension)     stress test completed by Walker Shadow, diagnosed as GERD  . Thyroid cancer 1980's  . Hypothyroidism   . H/O hiatal hernia   . External hemorrhoid, bleeding     "sometimes" (Dec 25, 2012)  . Arthritis     "knees; right thumb; shoulders" (2012/12/25)  . Kidney stones 1970's    "passed on their own" (Dec 25, 2012)  . OSA on CPAP   . UTI (urinary tract infection) 04/02/2013    Past Surgical History  Procedure Laterality Date  . Thyroidectomy, partial  1988    "then did iodine to remove the rest" (December 25, 2012)  . Sigmoid resection / rectopexy  25-Dec-2012  . Colon surgery    . Tonsillectomy  1951?   Marland Kitchen Cholecystectomy  1990  . Vaginal hysterectomy  1970's    "still have my ovaries" (12/25/2012)  . Dilation and curettage of uterus  1960's    "lots of them; had miscarriages" (12/25/12)  . Transrectal drainage of pelvic abscess  10/27/2012  . 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 Cystoscopy with stent placement  12-25-2012    Procedure: CYSTOSCOPY WITH STENT PLACEMENT;  Surgeon: Lindaann Slough, MD;  Location: [facility] OR;  Service: Urology;  Laterality: N/A;    Family History  Problem Relation Age of Onset  . Heart disease Father   . Pneumonia Father   . Hypertension Father   . Hyperlipidemia Father   . Cancer Father     skin  . Stroke Father   . Cancer Paternal Aunt   . Alzheimer's disease Mother   . Heart disease Mother   . Emphysema Brother     marijuana and cigarettes  . Alcohol abuse Brother   . Hearing loss Brother   . Diabetes Maternal Grandmother   . Alzheimer's disease Paternal Grandmother   . Cancer Paternal Grandmother     lung?- smoker  . Hyperlipidemia Paternal Grandmother   .  Heart attack Paternal Grandfather   . Alcohol abuse Paternal Grandfather     History   Social History  . Marital Status: Married    Spouse Name: N/A    Number of Children: N/A  . Years of Education: N/A   Occupational History  . Not on file.   Social History Main Topics  . Smoking status: Never Smoker   . Smokeless tobacco: Never Used  . Alcohol Use: Yes     Comment: 12/19/2012 "glass of wine q night q other month or so"  . Drug Use: No  . Sexual Activity: No   Other Topics Concern  . Not on file   Social History Narrative   Married    children    Current Outpatient Prescriptions on File Prior to Visit  Medication Sig Dispense Refill  . albuterol (PROVENTIL HFA;VENTOLIN HFA) 108 (90 BASE) MCG/ACT inhaler Inhale 2 puffs into the lungs every 6 (six) hours as  needed for wheezing.  1 Inhaler  2  . ALPRAZolam (XANAX) 0.25 MG tablet Take 1 tablet (0.25 mg total) by mouth 2 (two) times daily as needed for sleep.  30 tablet  0  . aspirin EC 81 MG tablet Take 81 mg by mouth daily. Hold 5 days prior to procedure      . cetirizine (ZYRTEC) 10 MG tablet Take 1 tablet (10 mg total) by mouth daily.  30 tablet  11  . cyclobenzaprine (FLEXERIL) 10 MG tablet Take 1 tablet (10 mg total) by mouth 3 (three) times daily as needed for muscle spasms.  30 tablet  0  . fluticasone (FLONASE) 50 MCG/ACT nasal spray Place 2 sprays into the nose daily.  16 g  6  . iron polysaccharides (NIFEREX) 150 MG capsule Take 150 mg by mouth daily.      Marland Kitchen levothyroxine (SYNTHROID, LEVOTHROID) 150 MCG tablet Take 150-225 mcg by mouth daily. 1 tablet (150 mcg) every day except Sunday, 1.5 tablet (225 mcg) on Sunday      . naproxen (NAPROSYN) 375 MG tablet Take 1 tablet (375 mg total) by mouth 2 (two) times daily with a meal.  180 tablet  1  . nitroGLYCERIN (NITROSTAT) 0.4 MG SL tablet Place 1 tablet (0.4 mg total) under the tongue every 5 (five) minutes as needed. For chest pain  25 tablet  1  . omeprazole (PRILOSEC OTC) 20 MG tablet Take 1 tablet (20 mg total) by mouth daily before breakfast.  90 tablet  1  . psyllium (REGULOID) 0.52 G capsule Take 0.52 g by mouth at bedtime.      . ranitidine (ZANTAC) 150 MG tablet Take 1 tablet (150 mg total) by mouth at bedtime.  90 tablet  1  . simvastatin (ZOCOR) 40 MG tablet Take 40 mg by mouth at bedtime.       . traMADol (ULTRAM) 50 MG tablet Take 1 tablet (50 mg total) by mouth every 6 (six) hours as needed for pain.  90 tablet  0  . valsartan-hydrochlorothiazide (DIOVAN-HCT) 320-25 MG per tablet Take 1 tablet by mouth daily.  90 tablet  1  . vitamin B-12 (CYANOCOBALAMIN) 1000 MCG tablet Take 1,000 mcg by mouth daily.      . Zinc 50 MG TABS Take by mouth.       No current facility-administered medications on file prior to visit.    Allergies   Allergen Reactions  . Niacin Other (See Comments) and Cough    "cough til I threw up" (12/19/2012)  .  Neomycin-Bacitracin Zn-Polymyx Rash    Polysporin- is tolerated     Review of Systems  Review of Systems  Constitutional: Negative for fever and malaise/fatigue.  HENT: Negative for congestion.   Eyes: Negative for discharge.  Respiratory: Negative for shortness of breath.   Cardiovascular: Negative for chest pain, palpitations and leg swelling.  Gastrointestinal: Negative for nausea, abdominal pain and diarrhea.  Genitourinary: Negative for dysuria.  Musculoskeletal: Negative for falls.  Skin: Negative for rash.  Neurological: Negative for loss of consciousness and headaches.  Endo/Heme/Allergies: Negative for polydipsia.  Psychiatric/Behavioral: Negative for depression and suicidal ideas. The patient is not nervous/anxious and does not have insomnia.     Objective  BP 130/74  Pulse 75  Temp(Src) 98.3 F (36.8 C) (Oral)  Ht 5\' 2"  (1.575 m)  Wt 207 lb 1.9 oz (93.949 kg)  BMI 37.87 kg/m2  SpO2 97%  Physical Exam  Physical Exam  Lab Results  Component Value Date   TSH 1.015 07/02/2013   Lab Results  Component Value Date   WBC 6.5 07/02/2013   HGB 11.3* 07/02/2013   HCT 33.5* 07/02/2013   MCV 83.8 07/02/2013   PLT 275 07/02/2013   Lab Results  Component Value Date   CREATININE 0.73 07/02/2013   BUN 16 07/02/2013   NA 135 07/02/2013   K 4.4 07/02/2013   CL 99 07/02/2013   CO2 31 07/02/2013   Lab Results  Component Value Date   ALT 17 07/02/2013   AST 19 07/02/2013   ALKPHOS 75 07/02/2013   BILITOT 0.3 07/02/2013   Lab Results  Component Value Date   CHOL 147 07/02/2013   Lab Results  Component Value Date   HDL 48 07/02/2013   Lab Results  Component Value Date   LDLCALC 68 07/02/2013   Lab Results  Component Value Date   TRIG 154* 07/02/2013   Lab Results  Component Value Date   CHOLHDL 3.1 07/02/2013     Assessment & Plan  Constipation Moving bowels  once to 3 x daily no blood seen but sense of incomplete evacuation is present. Also c/o increase gas ans pain especially in right side of abdomen. Concern for contstipation and consideration of possible abdominal wall hernia, check xray and labs today if no cause identified and patient continues to be symptomatic would refer back to surgeon Dr Lindie Spruce for further consideration   HYPERTENSION Well controlled, no changes.   Diverticulitis No recnet diverticular flares but is noting some trouble with bulging on the right side of her abd at times with some intermittent discomfort. Xray unremarkable today. Will need to consider return to surgery if discomfort worsens. Bowels are moving well.  OBSTRUCTIVE SLEEP APNEA Using CPAP  Asthma Stable on current meds.  Anemia Mild, improving, no changes  HYPERLIPIDEMIA Mild elevation, simvastatin 40 mg qhs  Preventative health care Tdap given today

## 2013-10-02 NOTE — Patient Instructions (Signed)
Try 325 mg aspirin, paste cover bandage at bed and corn pads if no imrpovement call for referral to podiatry  Plantar Wart Warts are benign (noncancerous) growths of the outer skin layer. They can occur at any time in life but are most common during childhood and the teen years. Warts can occur on many skin surfaces of the body. When they occur on the underside (sole) of your foot they are called plantar warts. They often emerge in groups with several small warts encircling a larger growth. CAUSES  Human papillomavirus (HPV) is the cause of plantar warts. HPV attacks a break in the skin of the foot. Walking barefoot can lead to exposure to the wart virus. Plantar warts tend to develop over areas of pressure such as the heel and ball of the foot. Plantar warts often grow into the deeper layers of skin. They may spread to other areas of the sole but cannot spread to other areas of the body. SYMPTOMS  You may also notice a growth on the undersurface of your foot. The wart may grow directly into the sole of the foot, or rise above the surface of the skin on the sole of the foot, or both. They are most often flat from pressure. Warts generally do not cause itching but may cause pain in the area of the wart when you put weight on your foot. DIAGNOSIS  Diagnosis is made by physical examination. This means your caregiver discovers it while examining your foot.  TREATMENT  There are many ways to treat plantar warts. However, warts are very tough. Sometimes it is difficult to treat them so that they go away completely and do not grow back. Any treatment must be done regularly to work. If left untreated, most plantar warts will eventually disappear over a period of one to two years. Treatments you can do at home include:  Putting duct tape over the top of the wart (occlusion), has been found to be effective over several months. The duct tape should be removed each night and reapplied until the wart has  disappeared.  Placing over-the-counter medications on top of the wart to help kill the wart virus and remove the wart tissue (salicylic acid, cantharidin, and dichloroacetic acid ) are useful. These are called keratolytic agents. These medications make the skin soft and gradually layers will shed away. Theses compounds are usually placed on the wart each night and then covered with a band-aid. They are also available in pre-medicated band-aid form. Avoid surrounding skin when applying these liquids as these medications can burn healthy skin. The treatment may take several months of nightly use to be effective.  Cryotherapy to freeze the wart has recently become available over-the-counter for children 4 years and older. This system makes use of a soft narrow applicator connected to a bottle of compressed cold liquid that is applied directly to the wart. This medication can burn health skin and should be used with caution.  As with all over-the-counter medications, read the directions carefully before use. Treatments generally done in your caregiver's office include:  Some aggressive treatments may cause discomfort, discoloration and scaring of the surrounding skin. The risks and benefits of treatment should be discussed with your caregiver.  Freezing the wart with liquid nitrogen (cryotherapy, see above).  Burning the wart with use of very high heat (cautery).  Injecting medication into the wart.  Surgically removing or laser treatment of the wart.  Your caregiver may refer you to a dermatologist for difficult to  treat, large sized or large numbers of warts. HOME CARE INSTRUCTIONS   Soak the affected area in warm water. Dry the area completely when you are done. Remove the top layer of softened skin, then apply the chosen topical medication and reapply a bandage.  Remove the bandage daily and file excess wart tissue (pumice stone works well for this purpose). Repeat the entire process daily or  every other day for weeks until the plantar wart disappears.  Several brands of salicylic acid pads are available as over-the-counter remedies.  Pain can be relieved by wearing a doughnut bandage. This is a bandage with a hole in it. The bandage is put on with the hole over the wart. This helps take the pressure off the wart and gives pain relief. To help prevent plantar warts:  Wear shoes and socks and change them daily.  Keep feet clean and dry.  Check your feet and your children's feet regularly.  Avoid direct contact with warts on other people.  Have growths, or changes on your skin checked by your caregiver. Document Released: 02/19/2004 Document Revised: 02/21/2012 Document Reviewed: 07/30/2009 [facility] Patient Information 2014 [place], [place].

## 2013-10-04 LAB — ZINC: Zinc: 65 ug/dL (ref 60–130)

## 2013-10-06 ENCOUNTER — Encounter: Payer: Self-pay | Admitting: Family Medicine

## 2013-10-06 DIAGNOSIS — D649 Anemia, unspecified: Secondary | ICD-10-CM

## 2013-10-06 DIAGNOSIS — Z0000 Encounter for general adult medical examination without abnormal findings: Secondary | ICD-10-CM | POA: Insufficient documentation

## 2013-10-06 HISTORY — DX: Anemia, unspecified: D64.9

## 2013-10-06 NOTE — Assessment & Plan Note (Signed)
Using CPAP 

## 2013-10-06 NOTE — Assessment & Plan Note (Signed)
No recnet diverticular flares but is noting some trouble with bulging on the right side of her abd at times with some intermittent discomfort. Xray unremarkable today. Will need to consider return to surgery if discomfort worsens. Bowels are moving well.

## 2013-10-06 NOTE — Assessment & Plan Note (Signed)
Mild, improving, no changes

## 2013-10-06 NOTE — Assessment & Plan Note (Signed)
Stable on current meds 

## 2013-10-06 NOTE — Assessment & Plan Note (Signed)
Tdap given today.

## 2013-10-06 NOTE — Assessment & Plan Note (Signed)
Well controlled, no changes 

## 2013-10-06 NOTE — Assessment & Plan Note (Signed)
Mild elevation, simvastatin 40 mg qhs

## 2013-10-30 ENCOUNTER — Encounter: Payer: Self-pay | Admitting: Family Medicine

## 2013-11-21 ENCOUNTER — Encounter: Payer: Self-pay | Admitting: Family Medicine

## 2013-11-21 DIAGNOSIS — I10 Essential (primary) hypertension: Secondary | ICD-10-CM

## 2013-11-23 MED ORDER — VALSARTAN-HYDROCHLOROTHIAZIDE 320-25 MG PO TABS: 1.0000 | ORAL_TABLET | Freq: Every day | ORAL | Status: DC

## 2013-11-23 NOTE — Telephone Encounter (Signed)
RX sent

## 2014-01-01 ENCOUNTER — Encounter: Payer: Self-pay | Admitting: Family Medicine

## 2014-01-01 ENCOUNTER — Ambulatory Visit: Payer: Medicare Other | Admitting: Family Medicine

## 2014-01-01 DIAGNOSIS — R51 Headache: Secondary | ICD-10-CM

## 2014-01-01 DIAGNOSIS — K219 Gastro-esophageal reflux disease without esophagitis: Secondary | ICD-10-CM

## 2014-01-02 ENCOUNTER — Ambulatory Visit: Payer: Medicare Other | Admitting: Family Medicine

## 2014-01-02 MED ORDER — TRAMADOL HCL 50 MG PO TABS: 50.0000 mg | ORAL_TABLET | Freq: Four times a day (QID) | ORAL | Status: DC | PRN

## 2014-01-02 MED ORDER — RANITIDINE HCL 150 MG PO TABS
150.0000 mg | ORAL_TABLET | Freq: Every day | ORAL | Status: DC
Start: 2014-01-02 — End: 2014-07-01

## 2014-01-02 NOTE — Telephone Encounter (Signed)
I sent in Ranitidine.  Pt had a my chart question, please advise?  AND  Please advise refill on tramadol?  Last RX was done on 09-25-13 quantity 90 with 0 refills  Pt would like a 90 day supply  If ok fax to [phone]

## 2014-01-02 NOTE — Telephone Encounter (Signed)
RX faxed

## 2014-01-03 ENCOUNTER — Encounter: Payer: Self-pay | Admitting: Family Medicine

## 2014-01-03 ENCOUNTER — Ambulatory Visit (INDEPENDENT_AMBULATORY_CARE_PROVIDER_SITE_OTHER): Payer: Medicare Other | Admitting: Family Medicine

## 2014-01-03 VITALS — BP 128/72 | HR 63 | Temp 98.0°F | Ht 62.0 in | Wt 208.1 lb

## 2014-01-03 DIAGNOSIS — I10 Essential (primary) hypertension: Secondary | ICD-10-CM

## 2014-01-03 DIAGNOSIS — H6690 Otitis media, unspecified, unspecified ear: Secondary | ICD-10-CM

## 2014-01-03 MED ORDER — AMOXICILLIN-POT CLAVULANATE 875-125 MG PO TABS: 1.0000 | ORAL_TABLET | Freq: Two times a day (BID) | ORAL | Status: DC

## 2014-01-03 NOTE — Patient Instructions (Signed)
Dr Blenda Mounts at [place] is redness, soreness, and swelling (inflammation) of one or both eyelids. It may be caused by an allergic reaction or a bacterial infection. Blepharitis may also be associated with reddened, scaly skin (seborrhea) of the scalp and eyebrows. While you sleep, eye discharge may cause your eyelashes to stick together. Your eyelids may itch, burn, swell, and may lose their lashes. These will grow back. Your eyes may become sensitive. Blepharitis may recur and need repeated treatment. If this is the case, you may require further evaluation by an eye specialist (ophthalmologist). HOME CARE INSTRUCTIONS   Keep your hands clean.  Use a clean towel each time you dry your eyelids. Do not use this towel to clean other areas. Do not share a towel or makeup with anyone.  Wash your eyelids with warm water or warm water mixed with a small amount of baby shampoo. Do this twice a day or as often as needed.  Wash your face and eyebrows at least once a day.  Use warm compresses 2 times a day for 10 minutes at a time, or as directed by your caregiver.  Apply antibiotic ointment as directed by your caregiver.  Avoid rubbing your eyes.  Avoid wearing makeup until you get better.  Follow up with your caregiver as directed. SEEK IMMEDIATE MEDICAL CARE IF:   You have pain, redness, or swelling that gets worse or spreads to other parts of your face.  Your vision changes, or you have pain when looking at lights or moving objects.  You have a fever.  Your symptoms continue for longer than 2 to 4 days or become worse. MAKE SURE YOU:   Understand these instructions.  Will watch your condition.  Will get help right away if you are not doing well or get worse. Document Released: 11/26/2000 Document Revised: 02/21/2012 Document Reviewed: 01/06/2011 [facility] Patient Information 2014 [place], [place].

## 2014-01-03 NOTE — Progress Notes (Signed)
Pre visit review using our clinic review tool, if applicable. No additional management support is needed unless otherwise documented below in the visit note. 

## 2014-01-04 ENCOUNTER — Ambulatory Visit: Payer: Medicare Other | Admitting: Family Medicine

## 2014-01-06 ENCOUNTER — Encounter: Payer: Self-pay | Admitting: Family Medicine

## 2014-01-06 DIAGNOSIS — M1990 Unspecified osteoarthritis, unspecified site: Secondary | ICD-10-CM

## 2014-01-07 ENCOUNTER — Encounter: Payer: Self-pay | Admitting: Family Medicine

## 2014-01-07 DIAGNOSIS — H6690 Otitis media, unspecified, unspecified ear: Secondary | ICD-10-CM

## 2014-01-07 HISTORY — DX: Otitis media, unspecified, unspecified ear: H66.90

## 2014-01-07 MED ORDER — NAPROXEN 375 MG PO TABS: 375.0000 mg | ORAL_TABLET | Freq: Two times a day (BID) | ORAL | Status: DC

## 2014-01-07 MED ORDER — SIMVASTATIN 40 MG PO TABS: 40.0000 mg | ORAL_TABLET | Freq: Every day | ORAL | Status: DC

## 2014-01-07 NOTE — Progress Notes (Signed)
Patient ID: Jackie Stevens, female   DOB: May 13, 1946, 68 y.o.   MRN: [id number] Jackie Stevens [id number] Jan 07, 1946 01/07/2014      Progress Note-Follow Up  Subjective  Chief Complaint  No chief complaint on file.   HPI  Patient is a 68 year old Caucasian female who is in today complaining of ear pain. She's been struggling with congestion and malaise for over a week is having severe pain now. No fevers or chills. No malaise or myalgias although she does note appetite is mildly depressed. Denies chest pain or palpitations. No shortness of breath GI or GU concerns. Has not tried any over-the-counter medications.  Past Medical History  Diagnosis Date  . Allergic rhinitis   . GERD (gastroesophageal reflux disease)   . Hyperlipidemia   . Chicken pox as a child  . Measles as a child  . Insomnia   . Thyroid disease   . Constipation   . Diverticulitis 10/25/2012    pt. reports that a drain was placed - 09/2012    . Abnormal cervical cytology 10/25/2012    Follows with Dr Leavy Cella of Gyn  . Asthma   . Heart murmur   . Complication of anesthesia   . PONV (postoperative nausea and vomiting)   . HTN (hypertension)     stress test completed by Anselm Lis, diagnosed as GERD  . Thyroid cancer 1980's  . Hypothyroidism   . H/O hiatal hernia   . External hemorrhoid, bleeding     "sometimes" (01-09-13)  . Arthritis     "knees; right thumb; shoulders" (January 09, 2013)  . Kidney stones 1970's    "passed on their own" (01/09/13)  . OSA on CPAP   . UTI (urinary tract infection) 04/02/2013  . Anemia 10/06/2013  . Preventative health care 10/06/2013    [facility] D[facility]at[facility] Pneumovax in 2012    Past Surgical History  Procedure Laterality Date  . Thyroidectomy, partial  1988    "then did iodine to remove the rest" (01-09-13)  . Sigmoid resection / rectopexy  01-09-2013  . Colon surgery    . Tonsillectomy  1951?  Marland Kitchen Cholecystectomy  1990  . Vaginal hysterectomy  1970's    "still  have my ovaries" (Jan 09, 2013)  . Dilation and curettage of uterus  1960's    "lots of them; had miscarriages" (01-09-13)  . Transrectal drainage of pelvic abscess  10/27/2012  . Colostomy revision  2013/01/09    Procedure: COLON RESECTION SIGMOID;  Surgeon: Gwenyth Ober, MD;  Location: [place];  Service: General;  Laterality: N/A;  . Cystoscopy with stent placement  January 09, 2013    Procedure: CYSTOSCOPY WITH STENT PLACEMENT;  Surgeon: Hanley Ben, MD;  Location: [place];  Service: Urology;  Laterality: N/A;    Family History  Problem Relation Age of Onset  . Heart disease Father   . Pneumonia Father   . Hypertension Father   . Hyperlipidemia Father   . Cancer Father     skin  . Stroke Father   . Cancer Paternal Aunt   . Alzheimer's disease Mother   . Heart disease Mother   . Emphysema Brother     marijuana and cigarettes  . Alcohol abuse Brother   . Hearing loss Brother   . Diabetes Maternal Grandmother   . Alzheimer's disease Paternal Grandmother   . Cancer Paternal Grandmother     lung?- smoker  . Hyperlipidemia Paternal Grandmother   . Heart attack Paternal Grandfather   . Alcohol abuse Paternal Grandfather  History   Social History  . Marital Status: Married    Spouse Name: N/A    Number of Children: N/A  . Years of Education: N/A   Occupational History  . Not on file.   Social History Main Topics  . Smoking status: Never Smoker   . Smokeless tobacco: Never Used  . Alcohol Use: Yes     Comment: 12/19/2012 "glass of wine q night q other month or so"  . Drug Use: No  . Sexual Activity: No   Other Topics Concern  . Not on file   Social History Narrative   Married    children    Current Outpatient Prescriptions on File Prior to Visit  Medication Sig Dispense Refill  . albuterol (PROVENTIL HFA;VENTOLIN HFA) 108 (90 BASE) MCG/ACT inhaler Inhale 2 puffs into the lungs every 6 (six) hours as needed for wheezing.  1 Inhaler  2  . ALPRAZolam (XANAX) 0.25 MG tablet  Take 1 tablet (0.25 mg total) by mouth 2 (two) times daily as needed for sleep.  30 tablet  0  . aspirin EC 81 MG tablet Take 81 mg by mouth daily. Hold 5 days prior to procedure      . cetirizine (ZYRTEC) 10 MG tablet Take 1 tablet (10 mg total) by mouth daily.  30 tablet  11  . cyclobenzaprine (FLEXERIL) 10 MG tablet Take 1 tablet (10 mg total) by mouth 3 (three) times daily as needed for muscle spasms.  30 tablet  0  . fluticasone (FLONASE) 50 MCG/ACT nasal spray Place 2 sprays into the nose daily.  16 g  6  . iron polysaccharides (NIFEREX) 150 MG capsule Take 150 mg by mouth daily.      Marland Kitchen levothyroxine (SYNTHROID, LEVOTHROID) 150 MCG tablet Take 150-225 mcg by mouth daily. 1 tablet (150 mcg) every day except Sunday, 1.5 tablet (225 mcg) on Sunday      . nitroGLYCERIN (NITROSTAT) 0.4 MG SL tablet Place 1 tablet (0.4 mg total) under the tongue every 5 (five) minutes as needed. For chest pain  25 tablet  1  . omeprazole (PRILOSEC OTC) 20 MG tablet Take 1 tablet (20 mg total) by mouth daily before breakfast.  90 tablet  1  . psyllium (REGULOID) 0.52 G capsule Take 0.52 g by mouth at bedtime.      . ranitidine (ZANTAC) 150 MG tablet Take 1 tablet (150 mg total) by mouth at bedtime.  90 tablet  1  . traMADol (ULTRAM) 50 MG tablet Take 1 tablet (50 mg total) by mouth every 6 (six) hours as needed.  90 tablet  0  . valsartan-hydrochlorothiazide (DIOVAN-HCT) 320-25 MG per tablet Take 1 tablet by mouth daily.  90 tablet  1  . vitamin B-12 (CYANOCOBALAMIN) 1000 MCG tablet Take 1,000 mcg by mouth daily.      . Zinc 50 MG TABS Take by mouth.       No current facility-administered medications on file prior to visit.    Allergies  Allergen Reactions  . Niacin Other (See Comments) and Cough    "cough til I threw up" (12/19/2012)  . Neomycin-Bacitracin Zn-Polymyx Rash    Polysporin- is tolerated     Review of Systems  Review of Systems  Constitutional: Negative for fever and malaise/fatigue.  HENT:  Positive for congestion and ear pain. Negative for sore throat.   Eyes: Negative for discharge.  Respiratory: Positive for sputum production. Negative for cough and shortness of breath.   Cardiovascular: Negative for  chest pain, palpitations and leg swelling.  Gastrointestinal: Negative for nausea, abdominal pain and diarrhea.  Genitourinary: Negative for dysuria.  Musculoskeletal: Negative for falls.  Skin: Negative for rash.  Neurological: Negative for loss of consciousness and headaches.  Endo/Heme/Allergies: Negative for polydipsia.  Psychiatric/Behavioral: Negative for depression and suicidal ideas. The patient is not nervous/anxious and does not have insomnia.     Objective  BP 128/72  Pulse 63  Temp(Src) 98 F (36.7 C) (Oral)  Ht 5\' 2"  (1.575 m)  Wt 208 lb 1.3 oz (94.384 kg)  BMI 38.05 kg/m2  SpO2 98%  Physical Exam  Physical Exam  Eyes:  TMs mildly erythematous    Lab Results  Component Value Date   TSH 2.213 10/02/2013   Lab Results  Component Value Date   WBC 6.9 10/02/2013   HGB 11.8* 10/02/2013   HCT 33.9* 10/02/2013   MCV 86.5 10/02/2013   PLT 267 10/02/2013   Lab Results  Component Value Date   CREATININE 0.82 10/02/2013   BUN 19 10/02/2013   NA 133* 10/02/2013   K 4.3 10/02/2013   CL 97 10/02/2013   CO2 31 10/02/2013   Lab Results  Component Value Date   ALT 23 10/02/2013   AST 22 10/02/2013   ALKPHOS 73 10/02/2013   BILITOT 0.3 10/02/2013   Lab Results  Component Value Date   CHOL 152 10/02/2013   Lab Results  Component Value Date   HDL 50 10/02/2013   Lab Results  Component Value Date   LDLCALC 62 10/02/2013   Lab Results  Component Value Date   TRIG 198* 10/02/2013   Lab Results  Component Value Date   CHOLHDL 3.0 10/02/2013     Assessment & Plan  Otitis media Augmentin, probiotics, increase rest and hydration  HYPERTENSION Well controlled, no changes

## 2014-01-07 NOTE — Assessment & Plan Note (Signed)
Augmentin, probiotics, increase rest and hydration

## 2014-01-07 NOTE — Telephone Encounter (Signed)
Naproxen was last filled 07/02/13, #180 x 1 refill.  I do not see that we have filled simvastatin for pt before.  Please advise.

## 2014-01-07 NOTE — Assessment & Plan Note (Signed)
Well controlled, no changes 

## 2014-01-22 ENCOUNTER — Ambulatory Visit (INDEPENDENT_AMBULATORY_CARE_PROVIDER_SITE_OTHER): Payer: Medicare Other | Admitting: Family

## 2014-01-22 ENCOUNTER — Encounter: Payer: Self-pay | Admitting: Family

## 2014-01-22 VITALS — BP 122/68 | HR 67 | Temp 97.6°F | Resp 12 | Ht 62.0 in | Wt 208.1 lb

## 2014-01-22 DIAGNOSIS — J309 Allergic rhinitis, unspecified: Secondary | ICD-10-CM

## 2014-01-22 MED ORDER — FLUTICASONE PROPIONATE 50 MCG/ACT NA SUSP: 2.0000 | Freq: Every day | NASAL | Status: DC

## 2014-01-22 NOTE — Progress Notes (Signed)
Pre visit review using our clinic review tool, if applicable. No additional management support is needed unless otherwise documented below in the visit note. 

## 2014-01-22 NOTE — Assessment & Plan Note (Signed)
Resume daily flonase and daily zyrtec. Call if symptoms worsen or if symptoms do not improve.

## 2014-01-22 NOTE — Progress Notes (Signed)
Subjective:    Patient ID: Jackie Stevens, female    DOB: 06-10-1946, 68 y.o.   MRN: [id number]  HPI  Jackie Stevens is a 68 yr old female who presents today with chief complaint of "runny nose." Reports that she saw Dr. Charlett Blake last month about same.  Sneezing, running nose, denies. Denies cough.  Not using flonase.  She is using zyrtec, "some times."      Review of Systems See HPI  Past Medical History  Diagnosis Date  . Allergic rhinitis   . GERD (gastroesophageal reflux disease)   . Hyperlipidemia   . Chicken pox as a child  . Measles as a child  . Insomnia   . Thyroid disease   . Constipation   . Diverticulitis 10/25/2012    pt. reports that a drain was placed - 09/2012    . Abnormal cervical cytology 10/25/2012    Follows with Dr Leavy Cella of Gyn  . Asthma   . Heart murmur   . Complication of anesthesia   . PONV (postoperative nausea and vomiting)   . HTN (hypertension)     stress test completed by Anselm Lis, diagnosed as GERD  . Thyroid cancer 1980's  . Hypothyroidism   . H/O hiatal hernia   . External hemorrhoid, bleeding     "sometimes" (01/16/2013)  . Arthritis     "knees; right thumb; shoulders" (01/16/2013)  . Kidney stones 1970's    "passed on their own" (2013/01/16)  . OSA on CPAP   . UTI (urinary tract infection) 04/02/2013  . Anemia 10/06/2013  . Preventative health care 10/06/2013    [facility] D[facility]atology Pneumovax in 2012    History   Social History  . Marital Status: Married    Spouse Name: N/A    Number of Children: N/A  . Years of Education: N/A   Occupational History  . Not on file.   Social History Main Topics  . Smoking status: Never Smoker   . Smokeless tobacco: Never Used  . Alcohol Use: Yes     Comment: 01-16-2013 "glass of wine q night q other month or so"  . Drug Use: No  . Sexual Activity: No   Other Topics Concern  . Not on file   Social History Narrative   Married    children    Past Surgical History    Procedure Laterality Date  . Thyroidectomy, partial  1988    "then did iodine to remove the rest" (16-Jan-2013)  . Sigmoid resection / rectopexy  01/16/13  . Colon surgery    . Tonsillectomy  1951?  Marland Kitchen Cholecystectomy  1990  . Vaginal hysterectomy  1970's    "still have my ovaries" (January 16, 2013)  . Dilation and curettage of uterus  1960's    "lots of them; had miscarriages" (2013/01/16)  . Transrectal drainage of pelvic abscess  10/27/2012  . Colostomy revision  January 16, 2013    Procedure: COLON RESECTION SIGMOID;  Surgeon: Gwenyth Ober, MD;  Location: [place];  Service: General;  Laterality: N/A;  . Cystoscopy with stent placement  2013-01-16    Procedure: CYSTOSCOPY WITH STENT PLACEMENT;  Surgeon: Hanley Ben, MD;  Location: [place];  Service: Urology;  Laterality: N/A;    Family History  Problem Relation Age of Onset  . Heart disease Father   . Pneumonia Father   . Hypertension Father   . Hyperlipidemia Father   . Cancer Father     skin  . Stroke Father   .  Cancer Paternal Aunt   . Alzheimer's disease Mother   . Heart disease Mother   . Emphysema Brother     marijuana and cigarettes  . Alcohol abuse Brother   . Hearing loss Brother   . Diabetes Maternal Grandmother   . Alzheimer's disease Paternal Grandmother   . Cancer Paternal Grandmother     lung?- smoker  . Hyperlipidemia Paternal Grandmother   . Heart attack Paternal Grandfather   . Alcohol abuse Paternal Grandfather     Allergies  Allergen Reactions  . Niacin Other (See Comments) and Cough    "cough til I threw up" (12/19/2012)  . Neomycin-Bacitracin Zn-Polymyx Rash    Polysporin- is tolerated     Current Outpatient Prescriptions on File Prior to Visit  Medication Sig Dispense Refill  . albuterol (PROVENTIL HFA;VENTOLIN HFA) 108 (90 BASE) MCG/ACT inhaler Inhale 2 puffs into the lungs every 6 (six) hours as needed for wheezing.  1 Inhaler  2  . ALPRAZolam (XANAX) 0.25 MG tablet Take 1 tablet (0.25 mg total) by mouth 2  (two) times daily as needed for sleep.  30 tablet  0  . aspirin EC 81 MG tablet Take 81 mg by mouth daily. Hold 5 days prior to procedure      . cetirizine (ZYRTEC) 10 MG tablet Take 1 tablet (10 mg total) by mouth daily.  30 tablet  11  . cyclobenzaprine (FLEXERIL) 10 MG tablet Take 1 tablet (10 mg total) by mouth 3 (three) times daily as needed for muscle spasms.  30 tablet  0  . iron polysaccharides (NIFEREX) 150 MG capsule Take 150 mg by mouth daily.      Marland Kitchen levothyroxine (SYNTHROID, LEVOTHROID) 150 MCG tablet Take 150-225 mcg by mouth daily. 1 tablet (150 mcg) every day except Sunday, 1.5 tablet (225 mcg) on Sunday      . naproxen (NAPROSYN) 375 MG tablet Take 1 tablet (375 mg total) by mouth 2 (two) times daily with a meal.  180 tablet  1  . nitroGLYCERIN (NITROSTAT) 0.4 MG SL tablet Place 1 tablet (0.4 mg total) under the tongue every 5 (five) minutes as needed. For chest pain  25 tablet  1  . omeprazole (PRILOSEC OTC) 20 MG tablet Take 1 tablet (20 mg total) by mouth daily before breakfast.  90 tablet  1  . psyllium (REGULOID) 0.52 G capsule Take 0.52 g by mouth at bedtime.      . ranitidine (ZANTAC) 150 MG tablet Take 1 tablet (150 mg total) by mouth at bedtime.  90 tablet  1  . simvastatin (ZOCOR) 40 MG tablet Take 1 tablet (40 mg total) by mouth at bedtime.  90 tablet  1  . traMADol (ULTRAM) 50 MG tablet Take 1 tablet (50 mg total) by mouth every 6 (six) hours as needed.  90 tablet  0  . valsartan-hydrochlorothiazide (DIOVAN-HCT) 320-25 MG per tablet Take 1 tablet by mouth daily.  90 tablet  1  . vitamin B-12 (CYANOCOBALAMIN) 1000 MCG tablet Take 1,000 mcg by mouth daily.      . Zinc 50 MG TABS Take by mouth.       No current facility-administered medications on file prior to visit.    BP 122/68  Pulse 67  Temp(Src) 97.6 F (36.4 C) (Oral)  Resp 12  Ht 5\' 2"  (1.575 m)  Wt 208 lb 1.9 oz (94.403 kg)  BMI 38.06 kg/m2  SpO2 97%       Objective:   Physical Exam  Constitutional: She is oriented to person, place, and time. She appears well-developed and well-nourished. No distress.  HENT:  Head: Normocephalic and atraumatic.  Right Ear: Tympanic membrane and ear canal normal.  Left Ear: Tympanic membrane and ear canal normal.  Mouth/Throat: No oropharyngeal exudate, posterior oropharyngeal edema, posterior oropharyngeal erythema or tonsillar abscesses.  Eyes: Conjunctivae are normal. Right conjunctiva is not injected. Left conjunctiva is not injected.  Eyes appear watery  Cardiovascular: Normal rate and regular rhythm.   No murmur heard. Pulmonary/Chest: Effort normal and breath sounds normal. No respiratory distress. She has no wheezes. She has no rales. She exhibits no tenderness.  Neurological: She is alert and oriented to person, place, and time.  Psychiatric: She has a normal mood and affect. Her behavior is normal. Judgment and thought content normal.          Assessment & Plan:

## 2014-01-22 NOTE — Patient Instructions (Signed)
Allergic Rhinitis Allergic rhinitis is when the mucous membranes in the nose respond to allergens. Allergens are particles in the air that cause your body to have an allergic reaction. This causes you to release allergic antibodies. Through a chain of events, these eventually cause you to release histamine into the blood stream. Although meant to protect the body, it is this release of histamine that causes your discomfort, such as frequent sneezing, congestion, and an itchy, runny nose.  CAUSES  Seasonal allergic rhinitis (hay fever) is caused by pollen allergens that may come from grasses, trees, and weeds. Year-round allergic rhinitis (perennial allergic rhinitis) is caused by allergens such as house dust mites, pet dander, and mold spores.  SYMPTOMS   Nasal stuffiness (congestion).  Itchy, runny nose with sneezing and tearing of the eyes. DIAGNOSIS  Your health care provider can help you determine the allergen or allergens that trigger your symptoms. If you and your health care provider are unable to determine the allergen, skin or blood testing may be used. TREATMENT  Allergic Rhinitis does not have a cure, but it can be controlled by:  Medicines and allergy shots (immunotherapy).  Avoiding the allergen. Hay fever may often be treated with antihistamines in pill or nasal spray forms. Antihistamines block the effects of histamine. There are over-the-counter medicines that may help with nasal congestion and swelling around the eyes. Check with your health care provider before taking or giving this medicine.  If avoiding the allergen or the medicine prescribed do not work, there are many new medicines your health care provider can prescribe. Stronger medicine may be used if initial measures are ineffective. Desensitizing injections can be used if medicine and avoidance does not work. Desensitization is when a patient is given ongoing shots until the body becomes less sensitive to the allergen.  Make sure you follow up with your health care provider if problems continue. HOME CARE INSTRUCTIONS It is not possible to completely avoid allergens, but you can reduce your symptoms by taking steps to limit your exposure to them. It helps to know exactly what you are allergic to so that you can avoid your specific triggers. SEEK MEDICAL CARE IF:   You have a fever.  You develop a cough that does not stop easily (persistent).  You have shortness of breath.  You start wheezing.  Symptoms interfere with normal daily activities. Document Released: 08/24/2001 Document Revised: 09/19/2013 Document Reviewed: 08/06/2013 [employer] Patient Information 2014 [employer]  

## 2014-01-29 NOTE — Telephone Encounter (Signed)
No visit

## 2014-02-05 ENCOUNTER — Ambulatory Visit: Payer: Self-pay

## 2014-02-07 ENCOUNTER — Ambulatory Visit: Payer: Medicare Other | Admitting: Family Medicine

## 2014-02-20 ENCOUNTER — Ambulatory Visit (INDEPENDENT_AMBULATORY_CARE_PROVIDER_SITE_OTHER): Payer: Medicare Other

## 2014-02-20 VITALS — BP 142/68 | HR 77 | Resp 18

## 2014-02-20 DIAGNOSIS — M21619 Bunion of unspecified foot: Secondary | ICD-10-CM

## 2014-02-20 DIAGNOSIS — M2010 Hallux valgus (acquired), unspecified foot: Secondary | ICD-10-CM

## 2014-02-20 DIAGNOSIS — Q828 Other specified congenital malformations of skin: Secondary | ICD-10-CM

## 2014-02-20 DIAGNOSIS — M2040 Other hammer toe(s) (acquired), unspecified foot: Secondary | ICD-10-CM

## 2014-02-20 DIAGNOSIS — M1990 Unspecified osteoarthritis, unspecified site: Secondary | ICD-10-CM

## 2014-02-20 NOTE — Patient Instructions (Signed)

## 2014-02-20 NOTE — Progress Notes (Signed)
   Subjective:    Patient ID: Jackie Stevens, female    DOB: 03/09/1946, 68 y.o.   MRN: [id number]  HPI my bunions are bad and have been going on for about 5 years and sore and tender and red and hurts with shoes and I have warts on both feet on the bottom and have been going on for about a year and tried corn remover and pads and ped egg    Review of Systems  HENT:       Right ear  Eyes:       Dry eye  Musculoskeletal: Positive for neck pain.       Right hip  Hematological: Bruises/bleeds easily.  All other systems reviewed and are negative.       Objective:   Physical Exam Vascular status is intact with pedal pulses palpable DP postal for PT plus one over 4. Epicritic and proprioceptive sensations intact and symmetric bilateral there is normal plantar response DTRs not listed neurologically skin color pigment normal hair growth absent nails criptotic and incurvated there is severe HAV deformity bilateral right more so than left with severe hammertoe deformities overlapping digits second overlapping the hallux right more so than left. X-rays demonstrates. Deformities IM angles 19 right 21 left reduction 19 left 21 right hallux abductus angle approximately 40 bilateral sesamoid position 7 bilateral there is degenerative changes and Lisfranc joint subluxation mid tarsus on oblique views and lateral projection severe promontory changes. Patient is having difficulty with enclosed shoes has very wide splayed forefoot ointment to each shoe and still not wide enough for her. Digital contractures associated keratoses and is keratoses sub-second and sub-fourth metatarsals bilateral to plantar flexed metatarsals and digital contractures.       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 There is associated keratoses due to digital deformities a poor keratotic lesions or debridement this time did recommend some cushioned insoles wider  shoes possibly an extra-depth shoe with Plastizote insoles patient may be candidate for these although may not be covered by her insurance issues that diabetic. At this time keratotic lesion is debrided to foam pads applied to the second toes bilateral to help cushion the deformity hammertoe deformities reappointed for future followup as needed dispensed literature about surgical intervention. 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 Recommendation is consider conservative, leave care at this time possible surgical intervention future next  Harriet Masson DPM

## 2014-02-25 ENCOUNTER — Encounter: Payer: Self-pay | Admitting: Family Medicine

## 2014-02-25 ENCOUNTER — Ambulatory Visit (INDEPENDENT_AMBULATORY_CARE_PROVIDER_SITE_OTHER): Payer: Medicare Other | Admitting: Family Medicine

## 2014-02-25 ENCOUNTER — Telehealth: Payer: Self-pay | Admitting: Family Medicine

## 2014-02-25 VITALS — BP 158/78 | HR 72 | Temp 98.2°F | Ht 62.0 in | Wt 212.1 lb

## 2014-02-25 DIAGNOSIS — E785 Hyperlipidemia, unspecified: Secondary | ICD-10-CM

## 2014-02-25 DIAGNOSIS — I10 Essential (primary) hypertension: Secondary | ICD-10-CM

## 2014-02-25 DIAGNOSIS — E079 Disorder of thyroid, unspecified: Secondary | ICD-10-CM

## 2014-02-25 DIAGNOSIS — G4733 Obstructive sleep apnea (adult) (pediatric): Secondary | ICD-10-CM

## 2014-02-25 DIAGNOSIS — Z0000 Encounter for general adult medical examination without abnormal findings: Secondary | ICD-10-CM

## 2014-02-25 DIAGNOSIS — D649 Anemia, unspecified: Secondary | ICD-10-CM

## 2014-02-25 LAB — LIPID PANEL
Cholesterol: 146 mg/dL (ref 0–200)
HDL: 47 mg/dL (ref >39)
LDL Cholesterol: 68 mg/dL (ref 0–99)
Total CHOL/HDL Ratio: 3.1 Ratio
Triglycerides: 156 mg/dL — ABNORMAL HIGH (ref <150)
VLDL: 31 mg/dL (ref 0–40)

## 2014-02-25 LAB — HEPATIC FUNCTION PANEL
ALT: 16 U/L (ref 0–35)
AST: 18 U/L (ref 0–37)
Albumin: 4.1 g/dL (ref 3.5–5.2)
Alkaline Phosphatase: 84 U/L (ref 39–117)
Bilirubin, Direct: 0.1 mg/dL (ref 0.0–0.3)
Indirect Bilirubin: 0.3 mg/dL (ref 0.2–1.2)
Total Bilirubin: 0.4 mg/dL (ref 0.2–1.2)
Total Protein: 6.6 g/dL (ref 6.0–8.3)

## 2014-02-25 LAB — CBC
HCT: 36 % (ref 36.0–46.0)
Hemoglobin: 12.3 g/dL (ref 12.0–15.0)
MCH: 29.1 pg (ref 26.0–34.0)
MCHC: 34.2 g/dL (ref 30.0–36.0)
MCV: 85.1 fL (ref 78.0–100.0)
Platelets: 299 10*3/uL (ref 150–400)
RBC: 4.23 MIL/uL (ref 3.87–5.11)
RDW: 14 % (ref 11.5–15.5)
WBC: 7.5 10*3/uL (ref 4.0–10.5)

## 2014-02-25 LAB — RENAL FUNCTION PANEL
Albumin: 4.1 g/dL (ref 3.5–5.2)
BUN: 16 mg/dL (ref 6–23)
CO2: 32 mEq/L (ref 19–32)
Calcium: 8.8 mg/dL (ref 8.4–10.5)
Chloride: 96 mEq/L (ref 96–112)
Creat: 0.76 mg/dL (ref 0.50–1.10)
Glucose, Bld: 84 mg/dL (ref 70–99)
Phosphorus: 3.8 mg/dL (ref 2.3–4.6)
Potassium: 4.5 mEq/L (ref 3.5–5.3)
Sodium: 135 mEq/L (ref 135–145)

## 2014-02-25 NOTE — Patient Instructions (Addendum)
64 oz of clear fluid daily Second dose of Psyllium or a dose of Miralax daily    DASH Diet The DASH diet stands for "Dietary Approaches to Stop Hypertension." It is a healthy eating plan that has been shown to reduce high blood pressure (hypertension) in as little as 14 days, while also possibly providing other significant health benefits. These other health benefits include reducing the risk of breast cancer after menopause and reducing the risk of type 2 diabetes, heart disease, colon cancer, and stroke. Health benefits also include weight loss and slowing kidney failure in patients with chronic kidney disease.  DIET GUIDELINES  Limit salt (sodium). Your diet should contain less than 1500 mg of sodium daily.  Limit refined or processed carbohydrates. Your diet should include mostly whole grains. Desserts and added sugars should be used sparingly.  Include small amounts of heart-healthy fats. These types of fats include nuts, oils, and tub margarine. Limit saturated and trans fats. These fats have been shown to be harmful in the body. CHOOSING FOODS  The following food groups are based on a 2000 calorie diet. See your Registered Dietitian for individual calorie needs. Grains and Grain Products (6 to 8 servings daily)  Eat More Often: Whole-wheat bread, brown rice, whole-grain or wheat pasta, quinoa, popcorn without added fat or salt (air popped).  Eat Less Often: White bread, white pasta, white rice, cornbread. Vegetables (4 to 5 servings daily)  Eat More Often: Fresh, frozen, and canned vegetables. Vegetables may be raw, steamed, roasted, or grilled with a minimal amount of fat.  Eat Less Often/Avoid: Creamed or fried vegetables. Vegetables in a cheese sauce. Fruit (4 to 5 servings daily)  Eat More Often: All fresh, canned (in natural juice), or frozen fruits. Dried fruits without added sugar. One hundred percent fruit juice ( cup [237 mL] daily).  Eat Less Often: Dried fruits with  added sugar. Canned fruit in light or heavy syrup. [employer], Fish, and Poultry (2 servings or less daily. One serving is 3 to 4 oz [85-114 g]).  Eat More Often: Ninety percent or leaner ground beef, tenderloin, sirloin. Round cuts of beef, chicken breast, [place] breast. All fish. Grill, bake, or broil your meat. Nothing should be fried.  Eat Less Often/Avoid: Fatty cuts of meat, [place], or chicken leg, thigh, or wing. Fried cuts of meat or fish. Dairy (2 to 3 servings)  Eat More Often: Low-fat or fat-free milk, low-fat plain or light yogurt, reduced-fat or part-skim cheese.  Eat Less Often/Avoid: Milk (whole, 2%).Whole milk yogurt. Full-fat cheeses. Nuts, Seeds, and Legumes (4 to 5 servings per week)  Eat More Often: All without added salt.  Eat Less Often/Avoid: Salted nuts and seeds, canned beans with added salt. Fats and Sweets (limited)  Eat More Often: Vegetable oils, tub margarines without trans fats, sugar-free gelatin. Mayonnaise and salad dressings.  Eat Less Often/Avoid: Coconut oils, palm oils, butter, stick margarine, cream, half and half, cookies, candy, pie. FOR MORE INFORMATION The Dash Diet Eating Plan: www.dashdiet.org Document Released: 11/18/2011 Document Revised: 02/21/2012 Document Reviewed: 11/18/2011 [facility] Patient Information 2014 [place], [place].     Constipation, Adult Constipation is when a person has fewer than 3 bowel movements a week; has difficulty having a bowel movement; or has stools that are dry, hard, or larger than normal. As people grow older, constipation is more common. If you try to fix constipation with medicines that make you have a bowel movement (laxatives), the problem may get worse. Long-term laxative use may  cause the muscles of the colon to become weak. A low-fiber diet, not taking in enough fluids, and taking certain medicines may make constipation worse. CAUSES  Certain medicines, such as antidepressants, pain medicine, iron  supplements, antacids, and water pills.  Certain diseases, such as diabetes, irritable bowel syndrome (IBS), thyroid disease, or depression.  Not drinking enough water.  Not eating enough fiber-rich foods.  Stress or travel. Lack of physical activity or exercise. Not going to the restroom when there is the urge to have a bowel movement. Ignoring the urge to have a bowel movement. Using laxatives too much. SYMPTOMS  Having fewer than 3 bowel movements a week.  Straining to have a bowel movement.  Having hard, dry, or larger than normal stools.  Feeling full or bloated.  Pain in the lower abdomen. Not feeling relief after having a bowel movement. DIAGNOSIS  Your caregiver will take a medical history and perform a physical exam. Further testing may be done for severe constipation. Some tests may include:  A barium enema X-ray to examine your rectum, colon, and sometimes, your small intestine. A sigmoidoscopy to examine your lower colon. A colonoscopy to examine your entire colon. TREATMENT  Treatment will depend on the severity of your constipation and what is causing it. Some dietary treatments include drinking more fluids and eating more fiber-rich foods. Lifestyle treatments may include regular exercise. If these diet and lifestyle recommendations do not help, your caregiver may recommend taking over-the-counter laxative medicines to help you have bowel movements. Prescription medicines may be prescribed if over-the-counter medicines do not work.  HOME CARE INSTRUCTIONS  Increase dietary fiber in your diet, such as fruits, vegetables, whole grains, and beans. Limit high-fat and processed sugars in your diet, such as [place] fries, hamburgers, cookies, candies, and soda.  A fiber supplement may be added to your diet if you cannot get enough fiber from foods.  Drink enough fluids to keep your urine clear or pale yellow.  Exercise regularly or as directed by your caregiver.  Go to  the restroom when you have the urge to go. Do not hold it. Only take medicines as directed by your caregiver. Do not take other medicines for constipation without talking to your caregiver first. [place] IF:  You have bright red blood in your stool.  Your constipation lasts for more than 4 days or gets worse.  You have abdominal or rectal pain.  You have thin, pencil-like stools. You have unexplained weight loss. MAKE SURE YOU:  Understand these instructions. Will watch your condition. Will get help right away if you are not doing well or get worse. Document Released: 08/27/2004 Document Revised: 02/21/2012 Document Reviewed: 09/10/2013 [facility] Patient Information 2014 [place], [place].

## 2014-02-25 NOTE — Progress Notes (Signed)
Patient ID: Jackie Stevens, female   DOB: 15-Nov-1946, 68 y.o.   MRN: [id number] JOCILYNN GRADE [id number] Dec 10, 1946 02/25/2014      Progress Note-Follow Up  Subjective  Chief Complaint  Chief Complaint  Patient presents with  . Follow-up    4 week    HPI  Patient is a 68 year old Caucasian female who is in today for. Overall she is doing well but is complaining of very painful severe bunions and is in the process of having them corrected. Her blood pressures have been running 120s to 130s over 60s to 70s at home. She denies any recent illness. She denies any congestion. Her biggest concern is of some recent difficulty with her memory. No difficulty with ADLs. She is still able to maintain her home, cooking and taking her pills  .Denies CP/palp/SOB/HA/congestion/fevers/GI or GU c/o. Taking meds as prescribed Past Medical History  Diagnosis Date  . Allergic rhinitis   . GERD (gastroesophageal reflux disease)   . Hyperlipidemia   . Chicken pox as a child  . Measles as a child  . Insomnia   . Thyroid disease   . Constipation   . Diverticulitis 10/25/2012    pt. reports that a drain was placed - 09/2012    . Abnormal cervical cytology 10/25/2012    Follows with Dr Leavy Cella of Gyn  . Asthma   . Heart murmur   . Complication of anesthesia   . PONV (postoperative nausea and vomiting)   . HTN (hypertension)     stress test completed by Anselm Lis, diagnosed as GERD  . Thyroid cancer 1980's  . Hypothyroidism   . H/O hiatal hernia   . External hemorrhoid, bleeding     "sometimes" (01-02-2013)  . Arthritis     "knees; right thumb; shoulders" (02-Jan-2013)  . Kidney stones 1970's    "passed on their own" (January 02, 2013)  . OSA on CPAP   . UTI (urinary tract infection) 04/02/2013  . Anemia 10/06/2013  . Preventative health care 10/06/2013    Steinhoffer of Dermatology Pneumovax in 2012    Past Surgical History  Procedure Laterality Date  . Thyroidectomy, partial  1988    "then  did iodine to remove the rest" (January 02, 2013)  . Sigmoid resection / rectopexy  02-Jan-2013  . Colon surgery    . Tonsillectomy  1951?  Marland Kitchen Cholecystectomy  1990  . Vaginal hysterectomy  1970's    "still have my ovaries" (01-02-13)  . Dilation and curettage of uterus  1960's    "lots of them; had miscarriages" (01/02/13)  . Transrectal drainage of pelvic abscess  10/27/2012  . 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 Cystoscopy with stent placement  2013-01-02    Procedure: CYSTOSCOPY WITH STENT PLACEMENT;  Surgeon: Hanley Ben, MD;  Location: [facility];  Service: Urology;  Laterality: N/A;    Family History  Problem Relation Age of Onset  . Heart disease Father   . Pneumonia Father   . Hypertension Father   . Hyperlipidemia Father   . Cancer Father     skin  . Stroke Father   . Cancer Paternal Aunt   . Alzheimer's disease Mother   . Heart disease Mother   . Emphysema Brother     marijuana and cigarettes  . Alcohol abuse Brother   . Hearing loss Brother   . Diabetes Maternal Grandmother   .  Alzheimer's disease Paternal Grandmother   . Cancer Paternal Grandmother     lung?- smoker  . Hyperlipidemia Paternal Grandmother   . Heart attack Paternal Grandfather   . Alcohol abuse Paternal Grandfather     History   Social History  . Marital Status: Married    Spouse Name: N/A    Number of Children: N/A  . Years of Education: N/A   Occupational History  . Not on file.   Social History Main Topics  . Smoking status: Never Smoker   . Smokeless tobacco: Never Used  . Alcohol Use: Yes     Comment: 12/19/2012 "glass of wine q night q other month or so"  . Drug Use: No  . Sexual Activity: No   Other Topics Concern  . Not on file   Social History Narrative   Married    children    Current Outpatient Prescriptions on File Prior to Visit  Medication Sig Dispense  Refill  . albuterol (PROVENTIL HFA;VENTOLIN HFA) 108 (90 BASE) MCG/ACT inhaler Inhale 2 puffs into the lungs every 6 (six) hours as needed for wheezing.  1 Inhaler  2  . cetirizine (ZYRTEC) 10 MG tablet Take 1 tablet (10 mg total) by mouth daily.  30 tablet  11  . fluticasone (FLONASE) 50 MCG/ACT nasal spray Place 2 sprays into both nostrils daily.  16 g  2  . iron polysaccharides (NIFEREX) 150 MG capsule Take 150 mg by mouth daily.      Marland Kitchen levothyroxine (SYNTHROID, LEVOTHROID) 150 MCG tablet Take 150-225 mcg by mouth daily. 1 tablet (150 mcg) every day except Sunday, 1.5 tablet (225 mcg) on Sunday      . naproxen (NAPROSYN) 375 MG tablet Take 1 tablet (375 mg total) by mouth 2 (two) times daily with a meal.  180 tablet  1  . nitroGLYCERIN (NITROSTAT) 0.4 MG SL tablet Place 1 tablet (0.4 mg total) under the tongue every 5 (five) minutes as needed. For chest pain  25 tablet  1  . omeprazole (PRILOSEC OTC) 20 MG tablet Take 1 tablet (20 mg total) by mouth daily before breakfast.  90 tablet  1  . psyllium (REGULOID) 0.52 G capsule Take 0.52 g by mouth at bedtime.      . ranitidine (ZANTAC) 150 MG tablet Take 1 tablet (150 mg total) by mouth at bedtime.  90 tablet  1  . simvastatin (ZOCOR) 40 MG tablet Take 1 tablet (40 mg total) by mouth at bedtime.  90 tablet  1  . traMADol (ULTRAM) 50 MG tablet Take 1 tablet (50 mg total) by mouth every 6 (six) hours as needed.  90 tablet  0  . valsartan-hydrochlorothiazide (DIOVAN-HCT) 320-25 MG per tablet Take 1 tablet by mouth daily.  90 tablet  1  . Zinc 50 MG TABS Take by mouth.       No current facility-administered medications on file prior to visit.    Allergies  Allergen Reactions  . Niacin Other (See Comments) and Cough    "cough til I threw up" (12/19/2012)  . Neomycin-Bacitracin Zn-Polymyx Rash    Polysporin- is tolerated     Review of Systems  Review of Systems  Constitutional: Negative for fever and malaise/fatigue.  HENT: Negative for  congestion.   Eyes: Negative for discharge.  Respiratory: Negative for shortness of breath.   Cardiovascular: Negative for chest pain, palpitations and leg swelling.  Gastrointestinal: Negative for nausea, abdominal pain and diarrhea.  Genitourinary: Negative for dysuria.  Musculoskeletal: Negative  for falls.  Skin: Negative for rash.  Neurological: Negative for loss of consciousness and headaches.  Endo/Heme/Allergies: Negative for polydipsia.  Psychiatric/Behavioral: Negative for depression and suicidal ideas. The patient is not nervous/anxious and does not have insomnia.     Objective  BP 158/78  Pulse 72  Temp(Src) 98.2 F (36.8 C) (Oral)  Ht 5\' 2"  (1.575 m)  Wt 212 lb 1.9 oz (96.217 kg)  BMI 38.79 kg/m2  SpO2 91%  Physical Exam  Physical Exam  Constitutional: She is oriented to person, place, and time and well-developed, well-nourished, and in no distress. No distress.  HENT:  Head: Normocephalic and atraumatic.  Eyes: Conjunctivae are normal.  Neck: Neck supple. No thyromegaly present.  Cardiovascular: Normal rate, regular rhythm and normal heart sounds.   No murmur heard. Pulmonary/Chest: Effort normal and breath sounds normal. She has no wheezes.  Abdominal: She exhibits no distension and no mass.  Musculoskeletal: She exhibits no edema.  Lymphadenopathy:    She has no cervical adenopathy.  Neurological: She is alert and oriented to person, place, and time.  Skin: Skin is warm and dry. No rash noted. She is not diaphoretic.  Psychiatric: Memory, affect and judgment normal.    Lab Results  Component Value Date   TSH 2.213 10/02/2013   Lab Results  Component Value Date   WBC 6.9 10/02/2013   HGB 11.8* 10/02/2013   HCT 33.9* 10/02/2013   MCV 86.5 10/02/2013   PLT 267 10/02/2013   Lab Results  Component Value Date   CREATININE 0.82 10/02/2013   BUN 19 10/02/2013   NA 133* 10/02/2013   K 4.3 10/02/2013   CL 97 10/02/2013   CO2 31 10/02/2013   Lab  Results  Component Value Date   ALT 23 10/02/2013   AST 22 10/02/2013   ALKPHOS 73 10/02/2013   BILITOT 0.3 10/02/2013   Lab Results  Component Value Date   CHOL 152 10/02/2013   Lab Results  Component Value Date   HDL 50 10/02/2013   Lab Results  Component Value Date   LDLCALC 62 10/02/2013   Lab Results  Component Value Date   TRIG 198* 10/02/2013   Lab Results  Component Value Date   CHOLHDL 3.0 10/02/2013     Assessment & Plan   Thyroid disease On Levothyroxine, continue to monitor  OBSTRUCTIVE SLEEP APNEA Using CPAP nightly, feels better rested after use.   HYPERTENSION Improved on recheck, no changes  HYPERLIPIDEMIA Encouraged heart healthy diet, increase exercise, avoid trans fats, consider a krill oil cap daily

## 2014-02-25 NOTE — Progress Notes (Signed)
Pre visit review using our clinic review tool, if applicable. No additional management support is needed unless otherwise documented below in the visit note. 

## 2014-02-25 NOTE — Telephone Encounter (Signed)
Return in about 3 months (around 05/28/2014) for lipid, renal, cbc, tsh, hepatic prior.   Patient will be going to [facility]

## 2014-02-26 ENCOUNTER — Telehealth: Payer: Self-pay | Admitting: Family Medicine

## 2014-02-26 LAB — TSH: TSH: 0.715 u[IU]/mL (ref 0.350–4.500)

## 2014-02-26 NOTE — Telephone Encounter (Signed)
Relevant patient education assigned to patient using Emmi. ° °

## 2014-02-27 NOTE — Assessment & Plan Note (Signed)
Encouraged heart healthy diet, increase exercise, avoid trans fats, consider a krill oil cap daily 

## 2014-02-27 NOTE — Assessment & Plan Note (Signed)
On Levothyroxine, continue to monitor 

## 2014-02-27 NOTE — Assessment & Plan Note (Signed)
Improved on recheck, no changes

## 2014-02-27 NOTE — Assessment & Plan Note (Signed)
Using CPAP nightly, feels better rested after use.

## 2014-03-30 ENCOUNTER — Encounter: Payer: Self-pay | Admitting: Family Medicine

## 2014-03-30 DIAGNOSIS — I10 Essential (primary) hypertension: Secondary | ICD-10-CM

## 2014-04-01 MED ORDER — VALSARTAN-HYDROCHLOROTHIAZIDE 320-25 MG PO TABS: 1.0000 | ORAL_TABLET | Freq: Every day | ORAL | Status: DC

## 2014-04-01 NOTE — Telephone Encounter (Signed)
Please advise refill request? Last RX was done on 01-02-14 quantity 90 with 0 refills

## 2014-04-22 ENCOUNTER — Other Ambulatory Visit (HOSPITAL_COMMUNITY)
Admission: RE | Admit: 2014-04-22 | Discharge: 2014-04-22 | Disposition: A | Discharge status: Home / Self Care | Payer: Medicare Other | Source: Ambulatory Visit | Attending: Family Medicine | Admitting: Family Medicine

## 2014-04-22 ENCOUNTER — Other Ambulatory Visit: Payer: Self-pay | Admitting: Family Medicine

## 2014-04-22 ENCOUNTER — Telehealth: Payer: Self-pay

## 2014-04-22 ENCOUNTER — Encounter: Payer: Self-pay | Admitting: Family Medicine

## 2014-04-22 DIAGNOSIS — N760 Acute vaginitis: Secondary | ICD-10-CM | POA: Insufficient documentation

## 2014-04-22 DIAGNOSIS — R51 Headache: Secondary | ICD-10-CM

## 2014-04-22 DIAGNOSIS — N390 Urinary tract infection, site not specified: Secondary | ICD-10-CM

## 2014-04-22 DIAGNOSIS — R8290 Unspecified abnormal findings in urine: Secondary | ICD-10-CM

## 2014-04-22 MED ORDER — TRAMADOL HCL 50 MG PO TABS: 50.0000 mg | ORAL_TABLET | Freq: Four times a day (QID) | ORAL | Status: DC | PRN

## 2014-04-22 NOTE — Telephone Encounter (Signed)
Faxed RX 

## 2014-04-22 NOTE — Telephone Encounter (Signed)
Please advise refill? Last RX was done on 01-02-14 quantity 90 with 0 refills  If ok fax to [phone]

## 2014-04-22 NOTE — Telephone Encounter (Signed)
Labs ordered per md 

## 2014-04-22 NOTE — Telephone Encounter (Signed)
Refill- tramadol  [facility]

## 2014-04-24 LAB — URINALYSIS
Bilirubin Urine: NEGATIVE
Glucose, UA: NEGATIVE mg/dL
Hgb urine dipstick: NEGATIVE
Ketones, ur: NEGATIVE mg/dL
Nitrite: NEGATIVE
Protein, ur: NEGATIVE mg/dL
Specific Gravity, Urine: 1.007 (ref 1.005–1.030)
Urobilinogen, UA: 0.2 mg/dL (ref 0.0–1.0)
pH: 6.5 (ref 5.0–8.0)

## 2014-04-25 ENCOUNTER — Telehealth: Payer: Self-pay | Admitting: Family Medicine

## 2014-04-25 LAB — CULTURE, URINE COMPREHENSIVE: Colony Count: >=100000

## 2014-04-25 MED ORDER — CIPROFLOXACIN HCL 500 MG PO TABS: 500.0000 mg | ORAL_TABLET | Freq: Two times a day (BID) | ORAL | Status: DC

## 2014-04-25 NOTE — Telephone Encounter (Signed)
Patient is requesting last lab results and she states that her kidneys are now hurting.

## 2014-04-25 NOTE — Telephone Encounter (Signed)
Pt informed see lab note

## 2014-04-26 MED ORDER — METRONIDAZOLE 500 MG PO TABS: 500.0000 mg | ORAL_TABLET | Freq: Two times a day (BID) | ORAL | Status: DC

## 2014-04-26 NOTE — Addendum Note (Signed)
Addended by: Varney Daily on: 04/26/2014 04:50 PM   Modules accepted: Orders

## 2014-05-18 ENCOUNTER — Encounter: Payer: Self-pay | Admitting: Family Medicine

## 2014-05-20 NOTE — Telephone Encounter (Signed)
Last labs were done on 02-25-14.

## 2014-05-21 ENCOUNTER — Other Ambulatory Visit: Payer: Self-pay | Admitting: Family Medicine

## 2014-05-28 LAB — CBC
HCT: 34.4 % — ABNORMAL LOW (ref 36.0–46.0)
Hemoglobin: 11.7 g/dL — ABNORMAL LOW (ref 12.0–15.0)
MCH: 28.7 pg (ref 26.0–34.0)
MCHC: 34 g/dL (ref 30.0–36.0)
MCV: 84.5 fL (ref 78.0–100.0)
Platelets: 302 10*3/uL (ref 150–400)
RBC: 4.07 MIL/uL (ref 3.87–5.11)
RDW: 14 % (ref 11.5–15.5)
WBC: 6.7 10*3/uL (ref 4.0–10.5)

## 2014-05-28 LAB — RENAL FUNCTION PANEL
Albumin: 4 g/dL (ref 3.5–5.2)
BUN: 15 mg/dL (ref 6–23)
CO2: 28 mEq/L (ref 19–32)
Calcium: 8.7 mg/dL (ref 8.4–10.5)
Chloride: 97 mEq/L (ref 96–112)
Creat: 0.75 mg/dL (ref 0.50–1.10)
Glucose, Bld: 94 mg/dL (ref 70–99)
Phosphorus: 3.8 mg/dL (ref 2.3–4.6)
Potassium: 4.3 mEq/L (ref 3.5–5.3)
Sodium: 134 mEq/L — ABNORMAL LOW (ref 135–145)

## 2014-05-28 LAB — HEPATIC FUNCTION PANEL
ALT: 22 U/L (ref 0–35)
AST: 21 U/L (ref 0–37)
Albumin: 4 g/dL (ref 3.5–5.2)
Alkaline Phosphatase: 81 U/L (ref 39–117)
Bilirubin, Direct: 0.1 mg/dL (ref 0.0–0.3)
Indirect Bilirubin: 0.3 mg/dL (ref 0.2–1.2)
Total Bilirubin: 0.4 mg/dL (ref 0.2–1.2)
Total Protein: 6.2 g/dL (ref 6.0–8.3)

## 2014-05-28 LAB — LIPID PANEL
Cholesterol: 143 mg/dL (ref 0–200)
HDL: 48 mg/dL (ref >39)
LDL Cholesterol: 76 mg/dL (ref 0–99)
Total CHOL/HDL Ratio: 3 Ratio
Triglycerides: 96 mg/dL (ref <150)
VLDL: 19 mg/dL (ref 0–40)

## 2014-05-28 LAB — TSH: TSH: 1.032 u[IU]/mL (ref 0.350–4.500)

## 2014-05-30 ENCOUNTER — Ambulatory Visit (INDEPENDENT_AMBULATORY_CARE_PROVIDER_SITE_OTHER): Payer: Medicare Other | Admitting: Family Medicine

## 2014-05-30 ENCOUNTER — Encounter: Payer: Self-pay | Admitting: Family Medicine

## 2014-05-30 VITALS — BP 144/84 | HR 79 | Temp 98.2°F | Ht 62.0 in | Wt 208.0 lb

## 2014-05-30 DIAGNOSIS — K219 Gastro-esophageal reflux disease without esophagitis: Secondary | ICD-10-CM

## 2014-05-30 DIAGNOSIS — I10 Essential (primary) hypertension: Secondary | ICD-10-CM

## 2014-05-30 DIAGNOSIS — E785 Hyperlipidemia, unspecified: Secondary | ICD-10-CM

## 2014-05-30 DIAGNOSIS — M545 Low back pain, unspecified: Secondary | ICD-10-CM

## 2014-05-30 DIAGNOSIS — J309 Allergic rhinitis, unspecified: Secondary | ICD-10-CM

## 2014-05-30 NOTE — Progress Notes (Signed)
Pre visit review using our clinic review tool, if applicable. No additional management support is needed unless otherwise documented below in the visit note. 

## 2014-05-30 NOTE — Patient Instructions (Addendum)
Try to flush nose with nasal saline prior to bedtime Try Salon Pas patches to knees.  2 Tramadol at bed as needed Probiotic daily and fiber twice a day  Tylenol ES 500 mg 2 tabs twice a day  Encouraged increased hydration and fiber in diet. Daily probiotics. If bowels not moving can use MOM 2 tbls po in 4 oz of warm prune juice by mouth every 2-3 days. If no results then repeat in 4 hours with  Dulcolax suppository pr, may repeat again in 4 more hours as needed. Seek care if symptoms worsen. Consider daily Miralax and/or Dulcolax if symptoms persist.   Try an iron supplement twice a week  Constipation Constipation is when a person has fewer than three bowel movements a week, has difficulty having a bowel movement, or has stools that are dry, hard, or larger than normal. As people grow older, constipation is more common. If you try to fix constipation with medicines that make you have a bowel movement (laxatives), the problem may get worse. Long-term laxative use may cause the muscles of the colon to become weak. A low-fiber diet, not taking in enough fluids, and taking certain medicines may make constipation worse.  CAUSES   Certain medicines, such as antidepressants, pain medicine, iron supplements, antacids, and water pills.   Certain diseases, such as diabetes, irritable bowel syndrome (IBS), thyroid disease, or depression.   Not drinking enough water.   Not eating enough fiber-rich foods.   Stress or travel.   Lack of physical activity or exercise.   Ignoring the urge to have a bowel movement.   Using laxatives too much.  SIGNS AND SYMPTOMS   Having fewer than three bowel movements a week.   Straining to have a bowel movement.   Having stools that are hard, dry, or larger than normal.   Feeling full or bloated.   Pain in the lower abdomen.   Not feeling relief after having a bowel movement.  DIAGNOSIS  Your health care Catcher Dehoyos will take a medical history  and perform a physical exam. Further testing may be done for severe constipation. Some tests may include:  A barium enema X-ray to examine your rectum, colon, and, sometimes, your small intestine.   A sigmoidoscopy to examine your lower colon.   A colonoscopy to examine your entire colon. TREATMENT  Treatment will depend on the severity of your constipation and what is causing it. Some dietary treatments include drinking more fluids and eating more fiber-rich foods. Lifestyle treatments may include regular exercise. If these diet and lifestyle recommendations do not help, your health care Ziyanna Tolin may recommend taking over-the-counter laxative medicines to help you have bowel movements. Prescription medicines may be prescribed if over-the-counter medicines do not work.  HOME CARE INSTRUCTIONS   Eat foods that have a lot of fiber, such as fruits, vegetables, whole grains, and beans.  Limit foods high in fat and processed sugars, such as french fries, hamburgers, cookies, candies, and soda.   A fiber supplement may be added to your diet if you cannot get enough fiber from foods.   Drink enough fluids to keep your urine clear or pale yellow.   Exercise regularly or as directed by your health care Sabella Traore.   Go to the restroom when you have the urge to go. Do not hold it.   Only take over-the-counter or prescription medicines as directed by your health care Leyli Kevorkian. Do not take other medicines for constipation without talking to your health care Marisa Hufstetler  first.  [place] IF:   You have bright red blood in your stool.   Your constipation lasts for more than 4 days or gets worse.   You have abdominal or rectal pain.   You have thin, pencil-like stools.   You have unexplained weight loss. MAKE SURE YOU:   Understand these instructions.  Will watch your condition.  Will get help right away if you are not doing well or get worse. Document Released:  08/27/2004 Document Revised: 12/04/2013 Document Reviewed: 09/10/2013 [facility] Patient Information 2015 [place], [place]. This information is not intended to replace advice given to you by your health care Yakima Kreitzer. Make sure you discuss any questions you have with your health care Mardi Cannady.

## 2014-06-09 ENCOUNTER — Encounter: Payer: Self-pay | Admitting: Family Medicine

## 2014-06-09 DIAGNOSIS — M545 Low back pain, unspecified: Secondary | ICD-10-CM

## 2014-06-09 HISTORY — DX: Low back pain, unspecified: M54.50

## 2014-06-09 NOTE — Assessment & Plan Note (Signed)
Good response to Loratadine, without it she struggles with worsening cough, encouraged to use daily

## 2014-06-09 NOTE — Assessment & Plan Note (Signed)
Improved on recheck, no changes to meds. Encouraged heart healthy diet such as the DASH diet and exercise as tolerated.  

## 2014-06-09 NOTE — Assessment & Plan Note (Signed)
Avoid offending foods, start probiotics. Do not eat large meals in late evening and consider raising head of bed.  

## 2014-06-09 NOTE — Assessment & Plan Note (Signed)
Tolerating statin, encouraged heart healthy diet, avoid trans fats, minimize simple carbs and saturated fats. Increase exercise as tolerated 

## 2014-06-09 NOTE — Progress Notes (Signed)
Patient ID: Jackie Stevens, female   DOB: May 26, 1946, 68 y.o.   MRN: [id number] JOLIENE SALVADOR [id number] 11-Jun-1946 06/09/2014      Progress Note-Follow Up  Subjective  Chief Complaint  Chief Complaint  Patient presents with  . Follow-up    3 month    HPI  Patient is a 68 year old female in today for routine medical care. She's generally doing well but she does note when she does not take her Claritin regularly her cough worsens again. She does have some ongoing congestion as well. No recent illness. She is complaining of increased low back pain. Describes a sensation of some increased discomfort in her knees as well. Worse with ambulation. Left knee worst in right knee. No trauma or falls. Denies CP/palp/SOB/HA/fevers/GI or GU c/o. Taking meds as prescribed  Past Medical History  Diagnosis Date  . Allergic rhinitis   . GERD (gastroesophageal reflux disease)   . Hyperlipidemia   . Chicken pox as a child  . Measles as a child  . Insomnia   . Thyroid disease   . Constipation   . Diverticulitis 10/25/2012    pt. reports that a drain was placed - 09/2012    . Abnormal cervical cytology 10/25/2012    Follows with Dr Leavy Cella of Gyn  . Asthma   . Heart murmur   . Complication of anesthesia   . PONV (postoperative nausea and vomiting)   . HTN (hypertension)     stress test completed by Anselm Lis, diagnosed as GERD  . Thyroid cancer 1980's  . Hypothyroidism   . H/O hiatal hernia   . External hemorrhoid, bleeding     "sometimes" (01/04/2013)  . Arthritis     "knees; right thumb; shoulders" (January 04, 2013)  . Kidney stones 1970's    "passed on their own" (2013/01/04)  . OSA on CPAP   . UTI (urinary tract infection) 04/02/2013  . Anemia 10/06/2013  . Preventative health care 10/06/2013    Steinhoffer of Dermatology Pneumovax in 2012  . Low back pain 06/09/2014    Past Surgical History  Procedure Laterality Date  . Thyroidectomy, partial  1988    "then did iodine to remove  the rest" (January 04, 2013)  . Sigmoid resection / rectopexy  2013-01-04  . Colon surgery    . Tonsillectomy  1951?  Marland Kitchen Cholecystectomy  1990  . Vaginal hysterectomy  1970's    "still have my ovaries" (01-04-2013)  . Dilation and curettage of uterus  1960's    "lots of them; had miscarriages" (Jan 04, 2013)  . Transrectal drainage of pelvic abscess  10/27/2012  . 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 Cystoscopy with stent placement  01-04-13    Procedure: CYSTOSCOPY WITH STENT PLACEMENT;  Surgeon: Hanley Ben, MD;  Location: [place];  Service: Urology;  Laterality: N/A;    Family History  Problem Relation Age of Onset  . Heart disease Father   . Pneumonia Father   . Hypertension Father   . Hyperlipidemia Father   . Cancer Father     skin  . Stroke Father   . Cancer Paternal Aunt   . Alzheimer's disease Mother   . Heart disease Mother   . Emphysema Brother     marijuana and cigarettes  . Alcohol abuse Brother   . Hearing loss Brother   . Diabetes Maternal Grandmother   .  Alzheimer's disease Paternal Grandmother   . Cancer Paternal Grandmother     lung?- smoker  . Hyperlipidemia Paternal Grandmother   . Heart attack Paternal Grandfather   . Alcohol abuse Paternal Grandfather     History   Social History  . Marital Status: Married    Spouse Name: N/A    Number of Children: N/A  . Years of Education: N/A   Occupational History  . Not on file.   Social History Main Topics  . Smoking status: Never Smoker   . Smokeless tobacco: Never Used  . Alcohol Use: Yes     Comment: 12/19/2012 "glass of wine q night q other month or so"  . Drug Use: No  . Sexual Activity: No   Other Topics Concern  . Not on file   Social History Narrative   Married    children    Current Outpatient Prescriptions on File Prior to Visit  Medication Sig Dispense Refill  . albuterol  (PROVENTIL HFA;VENTOLIN HFA) 108 (90 BASE) MCG/ACT inhaler Inhale 2 puffs into the lungs every 6 (six) hours as needed for wheezing.  1 Inhaler  2  . cetirizine (ZYRTEC) 10 MG tablet Take 1 tablet (10 mg total) by mouth daily.  30 tablet  11  . fluticasone (FLONASE) 50 MCG/ACT nasal spray Place 2 sprays into both nostrils daily.  16 g  2  . levothyroxine (SYNTHROID, LEVOTHROID) 150 MCG tablet Take 150-225 mcg by mouth daily. 1 tablet (150 mcg) every day except Sunday, 1.5 tablet (225 mcg) on Sunday      . naproxen (NAPROSYN) 375 MG tablet Take 1 tablet (375 mg total) by mouth 2 (two) times daily with a meal.  180 tablet  1  . omeprazole (PRILOSEC OTC) 20 MG tablet Take 1 tablet (20 mg total) by mouth daily before breakfast.  90 tablet  1  . psyllium (REGULOID) 0.52 G capsule Take 0.52 g by mouth at bedtime.      . ranitidine (ZANTAC) 150 MG tablet Take 1 tablet (150 mg total) by mouth at bedtime.  90 tablet  1  . simvastatin (ZOCOR) 40 MG tablet Take 1 tablet (40 mg total) by mouth at bedtime.  90 tablet  1  . traMADol (ULTRAM) 50 MG tablet Take 1 tablet (50 mg total) by mouth every 6 (six) hours as needed.  90 tablet  0  . valsartan-hydrochlorothiazide (DIOVAN-HCT) 320-25 MG per tablet Take 1 tablet by mouth daily.  90 tablet  1  . NITROSTAT 0.4 MG SL tablet PLACE 1 TABLET UNDER THE TONGUE EVERY 5 MINUTES AS NEEDED FOR CHEST PAIN  25 tablet  0   No current facility-administered medications on file prior to visit.    Allergies  Allergen Reactions  . Niacin Other (See Comments) and Cough    "cough til I threw up" (12/19/2012)  . Neomycin-Bacitracin Zn-Polymyx Rash    Polysporin- is tolerated     Review of Systems  Review of Systems  Constitutional: Negative for fever and malaise/fatigue.  HENT: Positive for congestion.   Eyes: Negative for discharge.  Respiratory: Positive for cough. Negative for shortness of breath.   Cardiovascular: Negative for chest pain, palpitations and leg  swelling.  Gastrointestinal: Negative for nausea, abdominal pain and diarrhea.  Genitourinary: Negative for dysuria.  Musculoskeletal: Positive for back pain and joint pain. Negative for falls.       B/l knee pain  Skin: Negative for rash.  Neurological: Negative for loss of consciousness and headaches.  Endo/Heme/Allergies: Negative for polydipsia.  Psychiatric/Behavioral: Negative for depression and suicidal ideas. The patient is not nervous/anxious and does not have insomnia.     Objective  BP 144/84  Pulse 79  Temp(Src) 98.2 F (36.8 C) (Oral)  Ht 5\' 2"  (1.575 m)  Wt 208 lb (94.348 kg)  BMI 38.03 kg/m2  SpO2 97%  Physical Exam  Physical Exam  Constitutional: She is oriented to person, place, and time and well-developed, well-nourished, and in no distress. No distress.  HENT:  Head: Normocephalic and atraumatic.  Eyes: Conjunctivae are normal.  Neck: Neck supple. No thyromegaly present.  Cardiovascular: Normal rate, regular rhythm and normal heart sounds.   No murmur heard. Pulmonary/Chest: Effort normal and breath sounds normal. She has no wheezes.  Abdominal: She exhibits no distension and no mass.  Musculoskeletal: She exhibits no edema.  Lymphadenopathy:    She has no cervical adenopathy.  Neurological: She is alert and oriented to person, place, and time.  Skin: Skin is warm and dry. No rash noted. She is not diaphoretic.  Psychiatric: Memory, affect and judgment normal.    Lab Results  Component Value Date   TSH 1.032 05/28/2014   Lab Results  Component Value Date   WBC 6.7 05/28/2014   HGB 11.7* 05/28/2014   HCT 34.4* 05/28/2014   MCV 84.5 05/28/2014   PLT 302 05/28/2014   Lab Results  Component Value Date   CREATININE 0.75 05/28/2014   BUN 15 05/28/2014   NA 134* 05/28/2014   K 4.3 05/28/2014   CL 97 05/28/2014   CO2 28 05/28/2014   Lab Results  Component Value Date   ALT 22 05/28/2014   AST 21 05/28/2014   ALKPHOS 81 05/28/2014   BILITOT 0.4 05/28/2014    Lab Results  Component Value Date   CHOL 143 05/28/2014   Lab Results  Component Value Date   HDL 48 05/28/2014   Lab Results  Component Value Date   LDLCALC 76 05/28/2014   Lab Results  Component Value Date   TRIG 96 05/28/2014   Lab Results  Component Value Date   CHOLHDL 3.0 05/28/2014     Assessment & Plan  HYPERTENSION Improved on recheck, no changes to meds. Encouraged heart healthy diet such as the DASH diet and exercise as tolerated.   G E R D Avoid offending foods, start probiotics. Do not eat large meals in late evening and consider raising head of bed.   HYPERLIPIDEMIA Tolerating statin, encouraged heart healthy diet, avoid trans fats, minimize simple carbs and saturated fats. Increase exercise as tolerated  ALLERGIC RHINITIS Good response to Loratadine, without it she struggles with worsening cough, encouraged to use daily  Low back pain Patient c/o worsening back pain with some pain in b/l knees L>R, norecent injury or fall. Encouraged moist heat and topical treatments. Offered referral but declines for now. Report if no improvement

## 2014-06-09 NOTE — Assessment & Plan Note (Signed)
Patient c/o worsening back pain with some pain in b/l knees L>R, norecent injury or fall. Encouraged moist heat and topical treatments. Offered referral but declines for now. Report if no improvement

## 2014-06-10 ENCOUNTER — Telehealth: Payer: Self-pay | Admitting: Family Medicine

## 2014-06-10 MED ORDER — SIMVASTATIN 40 MG PO TABS: 40.0000 mg | ORAL_TABLET | Freq: Every day | ORAL | Status: DC

## 2014-06-10 MED ORDER — NAPROXEN 375 MG PO TABS: 375.0000 mg | ORAL_TABLET | Freq: Two times a day (BID) | ORAL | Status: DC

## 2014-06-10 NOTE — Telephone Encounter (Signed)
Refill- naproxen  Refill-simvastatin  [facility]

## 2014-07-01 ENCOUNTER — Encounter: Payer: Self-pay | Admitting: Family Medicine

## 2014-07-01 ENCOUNTER — Telehealth: Payer: Self-pay | Admitting: *Deleted

## 2014-07-01 DIAGNOSIS — R51 Headache: Secondary | ICD-10-CM

## 2014-07-01 MED ORDER — TRAMADOL HCL 50 MG PO TABS: 50.0000 mg | ORAL_TABLET | Freq: Four times a day (QID) | ORAL | Status: DC | PRN

## 2014-07-01 MED ORDER — RANITIDINE HCL 150 MG PO TABS: 150.0000 mg | ORAL_TABLET | Freq: Every day | ORAL | Status: DC

## 2014-07-01 NOTE — Addendum Note (Signed)
Addended by: Varney Daily on: 07/01/2014 12:35 PM   Modules accepted: Orders

## 2014-07-01 NOTE — Telephone Encounter (Signed)
Please fax tramadol to mail order pharmacy.

## 2014-07-01 NOTE — Addendum Note (Signed)
Addended by: Raiford Noble on: 07/01/2014 12:50 PM   Modules accepted: Orders

## 2014-07-01 NOTE — Telephone Encounter (Signed)
I sent in Ranitidine  Please advise Tramadol refill?  Last RX was done on 04-22-14 quantity 90 with 0 refills  Last OV was 05-30-14

## 2014-07-01 NOTE — Telephone Encounter (Signed)
Rx request for Tramadol faxed to pharmacy; please call patient and inform.

## 2014-07-25 ENCOUNTER — Ambulatory Visit (INDEPENDENT_AMBULATORY_CARE_PROVIDER_SITE_OTHER): Payer: Medicare Other | Admitting: Internal Medicine

## 2014-07-25 ENCOUNTER — Encounter: Payer: Self-pay | Admitting: Internal Medicine

## 2014-07-25 VITALS — BP 144/62 | HR 86 | Ht 62.0 in | Wt 211.1 lb

## 2014-07-25 DIAGNOSIS — G4733 Obstructive sleep apnea (adult) (pediatric): Secondary | ICD-10-CM

## 2014-07-25 NOTE — Patient Instructions (Addendum)
Order- referral to Dr Katz/ Orthodontics to consider oral appliance for sleep apnea      Dx OSA   Ok to continue CPAP 10/ APS, unless an oral appliance takes its place.

## 2014-07-25 NOTE — Progress Notes (Signed)
Subjective:    Patient ID: Jackie Stevens, female    DOB: Apr 15, 1946, 68 y.o.   MRN: [id number]  HPI 07/26/11- followed for obstructive sleep apnea complicated by GERD, HBP, allergic rhinitis Last here 01/25/2011 Pending stress test for chest pain.  CPAP 10 Advanced- all night every night. It bothers her enough that she likes to leave the mask off after getting up for bathroom. Husband will tell her that she snores without it. We discussed comfort issues. Mostly she just doesn't like having something on her face.  07/25/12- 65 yoF never smoker followed for obstructive sleep apnea complicated by GERD, HBP, allergic rhinitis Wears CPAP10/Advanced  every night-except when having trouble with sleeping overall.  New complaint-sore throat x5 days. Ears itch without popping. Some watery nose and occasional sneeze with dry cough.  08/25/12- 65 yoF never smoker followed for obstructive sleep apnea complicated by GERD, HBP, allergic rhinitis ACUTE VISIT:dry cough-wheezing. Choke and gag wake her at night. Does not recognize indigestion or reflux. Codeine cough syrup lets her sleep through but does not stop the coughing according to her family. Persistent postnasal drip. Head of bed is elevated on brick. CPAP 10/Advanced is being used but cough interferes. Discussed history of thyroid cancer resected and treated with radioactive iodine 28 years ago.  07/25/13- 66 yoF never smoker followed for obstructive sleep apnea complicated by GERD, HBP, allergic rhinitis FOLLOWS FOR: pt reports wearing CPAP 10/ Advanced every night approx 6 hrs per night, tolerating pressures fine -- c/o occasional  discomfort at nose-- having trouble w [facility] would like to see about switching companies. Stress is affecting her sleep-husband had GI bleed.  07/25/14- 67 yoF never smoker followed for obstructive sleep apnea complicated by GERD, HBP, allergic rhinitis FOLLOWS FOR:  doing well with her CPAP 10/ APS.  still has some isses with  the cpap falling off and she has to readjust the mask.  She has preferred a nasal pillows mask but has never been entirely comfortable with any of the choices. We discussed oral appliances as an alternative and she is interested in learning more.  Review of Systems-see HPI Constitutional:   No-   weight loss, night sweats, fevers, chills, fatigue, lassitude. HEENT:   No-  headaches, difficulty swallowing, tooth/dental problems, sore throat,         No-sneezing, itching, ear ache,no- nasal congestion, post nasal drip,  CV:  No-   chest pain, orthopnea, PND, swelling in lower extremities, anasarca, dizziness, palpitations Resp: No-   shortness of breath with exertion or at rest.              No-   productive cough,  No- non-productive cough,  No-  coughing up of blood.              No-   change in color of mucus.  No- wheezing.   Skin: No-   rash or lesions. GI:  No-   heartburn, indigestion, abdominal pain, nausea, vomiting,  GU:  MS:  No-   joint pain or swelling.   Neuro- nothing unusual Psych:  No- change in mood or affect. No depression or anxiety.  No memory loss.   Objective:   Physical Exam General- Alert, Oriented, Affect-appropriate, Distress- none acute  Overweight, talkative Skin- rash-none, lesions- none, excoriation- none Lymphadenopathy- none Head- atraumatic            Eyes- Gross vision intact, PERRLA, conjunctivae clear secretions  Ears- Hearing, canals slightly retracted, no fluid            Nose- Clear, No-Septal dev, mucus, polyps, erosion, perforation             Throat- Mallampati III-IV , mucosa clear- not red , drainage- none, tonsils- atrophic, not hoarse Neck- flexible , trachea midline, no stridor , thyroid nl, carotid no bruit Chest - symmetrical excursion , unlabored           Heart/CV- RRR , no murmur , no gallop  , no rub, nl s1 s2                           - JVD- none , edema- none, stasis changes- none, varices- none           Lung- clear to  P&A, wheeze- none, cough- none , dullness-none, rub- none           Chest wall-  Abd-  Br/ Gen/ Rectal- Not done, not indicated Extrem- cyanosis- none, clubbing, none, atrophy- none, strength- nl Neuro- grossly intact to observation   Assessment & Plan:

## 2014-07-25 NOTE — Assessment & Plan Note (Signed)
She has been compliant with CPAP and it does control her snoring, but she continues to find it bothersome, especially because she can't keep the mask on. She would be an appropriate candidate for learning about an oral appliance Plan-referral for consideration of an oral appliance to treat obstructive sleep apnea

## 2014-08-21 ENCOUNTER — Ambulatory Visit (INDEPENDENT_AMBULATORY_CARE_PROVIDER_SITE_OTHER): Payer: Medicare Other | Admitting: Medical

## 2014-08-21 ENCOUNTER — Encounter: Payer: Self-pay | Admitting: Medical

## 2014-08-21 VITALS — BP 145/90 | HR 72 | Temp 98.6°F | Ht 62.0 in | Wt 211.2 lb

## 2014-08-21 DIAGNOSIS — R51 Headache: Secondary | ICD-10-CM

## 2014-08-21 DIAGNOSIS — N3000 Acute cystitis without hematuria: Secondary | ICD-10-CM

## 2014-08-21 DIAGNOSIS — R350 Frequency of micturition: Secondary | ICD-10-CM

## 2014-08-21 DIAGNOSIS — R519 Headache, unspecified: Secondary | ICD-10-CM | POA: Insufficient documentation

## 2014-08-21 DIAGNOSIS — J309 Allergic rhinitis, unspecified: Secondary | ICD-10-CM

## 2014-08-21 LAB — POCT URINALYSIS DIPSTICK
Glucose, UA: NEGATIVE
Ketones, UA: NEGATIVE
Nitrite, UA: POSITIVE
Protein, UA: 100
Spec Grav, UA: 1.015
Urobilinogen, UA: 0.2
pH, UA: 6.5

## 2014-08-21 MED ORDER — AMOXICILLIN-POT CLAVULANATE 875-125 MG PO TABS: 1.0000 | ORAL_TABLET | Freq: Two times a day (BID) | ORAL | Status: DC

## 2014-08-21 MED ORDER — FLUCONAZOLE 150 MG PO TABS: 150.0000 mg | ORAL_TABLET | Freq: Every day | ORAL | Status: DC

## 2014-08-21 NOTE — Assessment & Plan Note (Signed)
I think her reported HA is from frontal sinus related to allergies. Neuro exam negative but if this worsens notify us.

## 2014-08-21 NOTE — Assessment & Plan Note (Signed)
Pt given augmentin antibiotic pending urine culture results. Rx diflucan if she gets yeast infection.

## 2014-08-21 NOTE — Assessment & Plan Note (Signed)
Recent possible mild early flare for this fall with some eustachian tube dysfunction. I advised restart her nasal spray. She also has some sinus pressure frontal. I rx'd augmentin so this should cover her sinuses as well as her UTI.

## 2014-08-21 NOTE — Patient Instructions (Signed)
I did prescribe you augmentin for uti and diflcan if you get a yeast infection. Urine culture is pending. For your frontal sinus pressure this may be related to allergies so please start your steroid nasal spray. Follow up in 7 days or as needed.

## 2014-08-21 NOTE — Progress Notes (Signed)
   Subjective:    Patient ID: Jackie Stevens, female    DOB: [birth year], 68 y.o.   MRN: [id number]  HPI  Pt has frequent urination with some odor since the weekend. No fevers. Some chills but always the case. No nausea or vomiting. She does have some hx of uti. She states about 2 times a years. Some slight lt side cva area pain when leaned back early today but non now. Mild pain on urination.  Pt has faint mild frontal sinus/ Ha x 1 day. Mild runny nose. No sneezing She points to frontal sinus and states hx of some fall allergies. No blurred vision or gross motor/sensory function deficits.  Also some left ear pressure x 2 days. She states usually ear symptoms with allergies or sinus.    Review of Systems  Constitutional: Negative for fever and chills.  HENT: Positive for ear pain, rhinorrhea and sinus pressure. Negative for congestion, nosebleeds, sneezing, sore throat, tinnitus and trouble swallowing.        Mild left ear pressure for 2 days.  Respiratory: Negative for cough, choking, chest tightness and shortness of breath.   Cardiovascular: Negative for chest pain and palpitations.  Genitourinary: Positive for dysuria, urgency and frequency. Negative for vaginal bleeding, vaginal discharge, vaginal pain and pelvic pain.       Odor to urine.  Musculoskeletal: Negative for back pain.       None presently but mild left cva pain early am om leaning back.  Skin: Negative.   Neurological: Positive for headaches. Negative for dizziness, tremors, syncope, facial asymmetry, weakness, light-headedness and numbness.       More like sinus pressure related to allergies.       Objective:   Physical Exam  Constitutional: She is oriented to person, place, and time. She appears well-developed and well-nourished. No distress.  HENT:  Head: Normocephalic and atraumatic.  Right Ear: External ear normal.  Left Ear: External ear normal.  Eyes: Conjunctivae and EOM are normal. Pupils are equal,  round, and reactive to light.  Neck: Normal range of motion. No JVD present. No tracheal deviation present. No thyromegaly present.  Cardiovascular: Normal rate, regular rhythm and normal heart sounds.   Pulmonary/Chest: Effort normal and breath sounds normal. No stridor. No respiratory distress. She has no wheezes. She has no rales. She exhibits no tenderness.  Abdominal: Soft. Bowel sounds are normal. She exhibits no distension and no mass. There is no tenderness. There is no rebound and no guarding.  Only mild suprapapubic tenderness.  Musculoskeletal:  No cva tenderness.  Lymphadenopathy:    She has no cervical adenopathy.  Neurological: She is alert and oriented to person, place, and time.  CN III-XII intact, Neg romberg, finger to nose intact. No hand drift. Symmetric smile.  Skin: She is not diaphoretic.  Psychiatric: She has a normal mood and affect. Her behavior is normal. Judgment and thought content normal.          Assessment & Plan:  Note she has some blood on urinalysis but no gross blood. Will follow culture. If culture not positive then would plan to repeat urine to check for blood. If culture postive this appears to be related/caused by infection.

## 2014-08-21 NOTE — Addendum Note (Signed)
Addended by: Vernie Shanks E on: 08/21/2014 01:14 PM   Modules accepted: Orders

## 2014-08-22 ENCOUNTER — Encounter: Payer: Self-pay | Admitting: Family Medicine

## 2014-08-24 ENCOUNTER — Telehealth: Payer: Self-pay | Admitting: Medical

## 2014-08-24 LAB — CULTURE, URINE COMPREHENSIVE: Colony Count: >=100000

## 2014-08-24 NOTE — Telephone Encounter (Signed)
Pt did have ecoli on culture. Augmentin on sensitivity report. She is taking. Should feel better. Please advise her.

## 2014-08-26 ENCOUNTER — Encounter: Payer: Self-pay | Admitting: Family Medicine

## 2014-08-26 DIAGNOSIS — K219 Gastro-esophageal reflux disease without esophagitis: Secondary | ICD-10-CM

## 2014-08-26 NOTE — Telephone Encounter (Signed)
Called patient who states she has not used ABO at all. Advised her to use if she has symptoms. Will not call in another RX.

## 2014-08-26 NOTE — Telephone Encounter (Signed)
You could call her in one more tab of diflucan to use if she needs. Usually I give one tab one day if has symptoms. Could provide another to use if needed. Basically could refill one I gave her.

## 2014-08-26 NOTE — Telephone Encounter (Signed)
Advised patient of E-Coli and to continue Augmentin. Patient would like to know if she should continue taking medication for yeast? (Fluconizole)

## 2014-08-27 MED ORDER — OMEPRAZOLE MAGNESIUM 20 MG PO TBEC: 20.0000 mg | DELAYED_RELEASE_TABLET | Freq: Every day | ORAL | Status: DC

## 2014-08-28 ENCOUNTER — Encounter: Payer: Self-pay | Admitting: Family Medicine

## 2014-09-10 ENCOUNTER — Ambulatory Visit (INDEPENDENT_AMBULATORY_CARE_PROVIDER_SITE_OTHER): Payer: Medicare Other | Admitting: Physician Assistant

## 2014-09-10 ENCOUNTER — Encounter: Payer: Self-pay | Admitting: Physician Assistant

## 2014-09-10 VITALS — BP 154/57 | HR 63 | Temp 98.0°F | Resp 16 | Ht 62.0 in | Wt 210.2 lb

## 2014-09-10 DIAGNOSIS — R82998 Other abnormal findings in urine: Secondary | ICD-10-CM

## 2014-09-10 DIAGNOSIS — R8290 Unspecified abnormal findings in urine: Secondary | ICD-10-CM

## 2014-09-10 DIAGNOSIS — R399 Unspecified symptoms and signs involving the genitourinary system: Secondary | ICD-10-CM

## 2014-09-10 DIAGNOSIS — R3989 Other symptoms and signs involving the genitourinary system: Secondary | ICD-10-CM

## 2014-09-10 DIAGNOSIS — N3090 Cystitis, unspecified without hematuria: Secondary | ICD-10-CM | POA: Insufficient documentation

## 2014-09-10 LAB — POCT URINALYSIS DIPSTICK
Bilirubin, UA: NEGATIVE
Glucose, UA: NEGATIVE
Ketones, UA: NEGATIVE
Nitrite, UA: NEGATIVE
Protein, UA: NEGATIVE
Spec Grav, UA: 1.01
Urobilinogen, UA: 0.2
pH, UA: 6.5

## 2014-09-10 MED ORDER — CIPROFLOXACIN HCL 250 MG PO TABS: 250.0000 mg | ORAL_TABLET | Freq: Two times a day (BID) | ORAL | Status: DC

## 2014-09-10 NOTE — Progress Notes (Signed)
Patient presents to clinic today c/o continue urinary urgency, frequency, dysuria and mild low back pain x 3 weeks.  Was seen on 9/11 and treated for UTI with Augmentin.  Endorses initial improvement in symptoms but noted they "came back full force".  Endorses completing antibiotic.  Denies fever, chills, nausea or vomiting.  Past Medical History  Diagnosis Date  . Allergic rhinitis   . GERD (gastroesophageal reflux disease)   . Hyperlipidemia   . Chicken pox as a child  . Measles as a child  . Insomnia   . Thyroid disease   . Constipation   . Diverticulitis 10/25/2012    pt. reports that a drain was placed - 09/2012    . Abnormal cervical cytology 10/25/2012    Follows with Dr Leavy Cella of Gyn  . Asthma   . Heart murmur   . Complication of anesthesia   . PONV (postoperative nausea and vomiting)   . HTN (hypertension)     stress test completed by Anselm Lis, diagnosed as GERD  . Thyroid cancer 1980's  . Hypothyroidism   . H/O hiatal hernia   . External hemorrhoid, bleeding     "sometimes" (04-Jan-2013)  . Arthritis     "knees; right thumb; shoulders" (01-04-13)  . Kidney stones 1970's    "passed on their own" (01-04-2013)  . OSA on CPAP   . UTI (urinary tract infection) 04/02/2013  . Anemia 10/06/2013  . Preventative health care 10/06/2013    [facility] Pneumovax in 2012  . Low back pain 06/09/2014    Current Outpatient Prescriptions on File Prior to Visit  Medication Sig Dispense Refill  . albuterol (PROVENTIL HFA;VENTOLIN HFA) 108 (90 BASE) MCG/ACT inhaler Inhale 2 puffs into the lungs every 6 (six) hours as needed for wheezing.  1 Inhaler  2  . cetirizine (ZYRTEC) 10 MG tablet Take 1 tablet (10 mg total) by mouth daily.  30 tablet  11  . fluticasone (FLONASE) 50 MCG/ACT nasal spray Place 2 sprays into both nostrils daily.  16 g  2  . Ginger, Zingiber officinalis, (GINGER PO) Take by mouth.      . IRON PO Take 500 mg by mouth every other day.        Marland Kitchen KRILL OIL PO Take 500 mg by mouth.      . levothyroxine (SYNTHROID, LEVOTHROID) 150 MCG tablet Take 150-225 mcg by mouth daily. 1 tablet (150 mcg) every day except Sunday, 1.5 tablet (225 mcg) on Sunday      . naproxen (NAPROSYN) 375 MG tablet Take 1 tablet (375 mg total) by mouth 2 (two) times daily with a meal.  180 tablet  1  . NITROSTAT 0.4 MG SL tablet PLACE 1 TABLET UNDER THE TONGUE EVERY 5 MINUTES AS NEEDED FOR CHEST PAIN  25 tablet  0  . omeprazole (PRILOSEC OTC) 20 MG tablet Take 1 tablet (20 mg total) by mouth daily before breakfast.  90 tablet  1  . psyllium (REGULOID) 0.52 G capsule Take 0.52 g by mouth at bedtime.      . ranitidine (ZANTAC) 150 MG tablet Take 1 tablet (150 mg total) by mouth at bedtime.  90 tablet  1  . simvastatin (ZOCOR) 40 MG tablet Take 1 tablet (40 mg total) by mouth at bedtime.  90 tablet  1  . traMADol (ULTRAM) 50 MG tablet Take 1 tablet (50 mg total) by mouth every 6 (six) hours as needed.  90 tablet  0  . valsartan-hydrochlorothiazide (  DIOVAN-HCT) 320-25 MG per tablet Take 1 tablet by mouth daily.  90 tablet  1   No current facility-administered medications on file prior to visit.    Allergies  Allergen Reactions  . Niacin Other (See Comments) and Cough    "cough til I threw up" (12/19/2012)  . Neomycin-Bacitracin Zn-Polymyx Rash    Polysporin- is tolerated     Family History  Problem Relation Age of Onset  . Heart disease Father   . Pneumonia Father   . Hypertension Father   . Hyperlipidemia Father   . Cancer Father     skin  . Stroke Father   . Cancer Paternal Aunt   . Alzheimer's disease Mother   . Heart disease Mother   . Emphysema Brother     marijuana and cigarettes  . Alcohol abuse Brother   . Hearing loss Brother   . Diabetes Maternal Grandmother   . Alzheimer's disease Paternal Grandmother   . Cancer Paternal Grandmother     lung?- smoker  . Hyperlipidemia Paternal Grandmother   . Heart attack Paternal Grandfather   .  Alcohol abuse Paternal Grandfather     History   Social History  . Marital Status: Married    Spouse Name: N/A    Number of Children: N/A  . Years of Education: N/A   Social History Main Topics  . Smoking status: Never Smoker   . Smokeless tobacco: Never Used  . Alcohol Use: Yes     Comment: 12/19/2012 "glass of wine q night q other month or so"  . Drug Use: No  . Sexual Activity: No   Other Topics Concern  . None   Social History Narrative   Married    children    Review of Systems - See HPI.  All other ROS are negative.  BP 154/57  Pulse 63  Temp(Src) 98 F (36.7 C) (Oral)  Resp 16  Ht 5\' 2"  (1.575 m)  Wt 210 lb 4 oz (95.369 kg)  BMI 38.45 kg/m2  SpO2 100%  Physical Exam  Vitals reviewed. Constitutional: She is oriented to person, place, and time and well-developed, well-nourished, and in no distress.  HENT:  Head: Normocephalic and atraumatic.  Eyes: Conjunctivae are normal.  Cardiovascular: Normal rate, regular rhythm, normal heart sounds and intact distal pulses.   Pulmonary/Chest: Effort normal and breath sounds normal. No respiratory distress. She has no wheezes. She has no rales. She exhibits no tenderness.  Abdominal:  Negative CVA tenderness.  Neurological: She is alert and oriented to person, place, and time.  Skin: Skin is warm and dry. No rash noted.  Psychiatric: Affect normal.    Recent Results (from the past 2160 hour(s))  POCT URINALYSIS DIPSTICK     Status: Abnormal   Collection Time    08/21/14  1:12 PM      Result Value Ref Range   Color, UA Yellow     Clarity, UA Cloudy     Glucose, UA Neg     Bilirubin, UA Small     Ketones, UA Neg     Spec Grav, UA 1.015     Blood, UA Large     pH, UA 6.5     Protein, UA 100     Urobilinogen, UA 0.2     Nitrite, UA Pos     Leukocytes, UA moderate (2+)    CULTURE, URINE COMPREHENSIVE     Status: None   Collection Time    08/21/14  1:44 PM  Result Value Ref Range   Culture ESCHERICHIA  COLI     Colony Count >=100,000 COLONIES/ML     Organism ID, Bacteria ESCHERICHIA COLI    POCT URINALYSIS DIPSTICK     Status: Abnormal   Collection Time    09/10/14 10:36 AM      Result Value Ref Range   Color, UA straw     Clarity, UA murky     Glucose, UA neg     Bilirubin, UA neg     Ketones, UA neg     Spec Grav, UA 1.010     Blood, UA Moderate     pH, UA 6.5     Protein, UA neg     Urobilinogen, UA 0.2     Nitrite, UA neg     Leukocytes, UA moderate (2+)      Assessment/Plan: Cystitis Previous Urine Culture shows Cipro to be the most effective.  Will Rx Cipro. Will send urine for repeat culture.  Increase fluids.  Rest.  Probiotic and cranberry supplement.  Return precautions discussed with patient.

## 2014-09-10 NOTE — Patient Instructions (Addendum)
Please take antibiotic as directed until all tablets are gone.  Stay well hydrated.  Take a daily cranberry supplement.  I will call you with your results.  If symptoms do not resolve with antibiotic, please call or return to clinic.  Urinary Tract Infection Urinary tract infections (UTIs) can develop anywhere along your urinary tract. Your urinary tract is your body's drainage system for removing wastes and extra water. Your urinary tract includes two kidneys, two ureters, a bladder, and a urethra. Your kidneys are a pair of bean-shaped organs. Each kidney is about the size of your fist. They are located below your ribs, one on each side of your spine. CAUSES Infections are caused by microbes, which are microscopic organisms, including fungi, viruses, and bacteria. These organisms are so small that they can only be seen through a microscope. Bacteria are the microbes that most commonly cause UTIs. SYMPTOMS  Symptoms of UTIs may vary by age and gender of the patient and by the location of the infection. Symptoms in young women typically include a frequent and intense urge to urinate and a painful, burning feeling in the bladder or urethra during urination. Older women and men are more likely to be tired, shaky, and weak and have muscle aches and abdominal pain. A fever may mean the infection is in your kidneys. Other symptoms of a kidney infection include pain in your back or sides below the ribs, nausea, and vomiting. DIAGNOSIS To diagnose a UTI, your caregiver will ask you about your symptoms. Your caregiver also will ask to provide a urine sample. The urine sample will be tested for bacteria and white blood cells. White blood cells are made by your body to help fight infection. TREATMENT  Typically, UTIs can be treated with medication. Because most UTIs are caused by a bacterial infection, they usually can be treated with the use of antibiotics. The choice of antibiotic and length of treatment depend  on your symptoms and the type of bacteria causing your infection. HOME CARE INSTRUCTIONS  If you were prescribed antibiotics, take them exactly as your caregiver instructs you. Finish the medication even if you feel better after you have only taken some of the medication.  Drink enough water and fluids to keep your urine clear or pale yellow.  Avoid caffeine, tea, and carbonated beverages. They tend to irritate your bladder.  Empty your bladder often. Avoid holding urine for long periods of time.  Empty your bladder before and after sexual intercourse.  After a bowel movement, women should cleanse from front to back. Use each tissue only once. SEEK MEDICAL CARE IF:   You have back pain.  You develop a fever.  Your symptoms do not begin to resolve within 3 days. SEEK IMMEDIATE MEDICAL CARE IF:   You have severe back pain or lower abdominal pain.  You develop chills.  You have nausea or vomiting.  You have continued burning or discomfort with urination. MAKE SURE YOU:   Understand these instructions.  Will watch your condition.  Will get help right away if you are not doing well or get worse. Document Released: 09/08/2005 Document Revised: 05/30/2012 Document Reviewed: 01/07/2012 [facility] Patient Information 2015 [place], [place]. This information is not intended to replace advice given to you by your health care provider. Make sure you discuss any questions you have with your health care provider.

## 2014-09-10 NOTE — Progress Notes (Signed)
Pre visit review using our clinic review tool, if applicable. No additional management support is needed unless otherwise documented below in the visit note/SLS  

## 2014-09-10 NOTE — Assessment & Plan Note (Signed)
Previous Urine Culture shows Cipro to be the most effective.  Will Rx Cipro. Will send urine for repeat culture.  Increase fluids.  Rest.  Probiotic and cranberry supplement.  Return precautions discussed with patient.

## 2014-09-12 ENCOUNTER — Encounter: Payer: Self-pay | Admitting: Cardiology

## 2014-09-12 LAB — CULTURE, URINE COMPREHENSIVE: Colony Count: >=100000

## 2014-10-21 ENCOUNTER — Encounter: Payer: Self-pay | Admitting: Family Medicine

## 2014-10-21 ENCOUNTER — Ambulatory Visit: Payer: Medicare Other

## 2014-10-21 ENCOUNTER — Ambulatory Visit: Payer: Medicare Other | Admitting: Family Medicine

## 2014-10-21 ENCOUNTER — Other Ambulatory Visit: Payer: Self-pay | Admitting: Physician Assistant

## 2014-10-21 ENCOUNTER — Ambulatory Visit (INDEPENDENT_AMBULATORY_CARE_PROVIDER_SITE_OTHER): Payer: Medicare Other

## 2014-10-21 DIAGNOSIS — Z23 Encounter for immunization: Secondary | ICD-10-CM

## 2014-10-21 MED ORDER — TRAMADOL HCL 50 MG PO TABS: 50.0000 mg | ORAL_TABLET | Freq: Four times a day (QID) | ORAL | Status: DC | PRN

## 2014-10-21 NOTE — Telephone Encounter (Signed)
rx faxed

## 2014-10-21 NOTE — Telephone Encounter (Signed)
I have signed for a refill on the Tramadol but let her know she needs an appt with me to get further refills since I have not seen her since July

## 2014-10-21 NOTE — Telephone Encounter (Signed)
Refill request for Tramadol Last filled by MD on - 07.20.15, #[id number] to [facility] - 06.18.15 [Seen for Acutes after] Next AEX - 3 Mths. Please Advise on refills/sls

## 2014-11-01 ENCOUNTER — Other Ambulatory Visit: Payer: Self-pay | Admitting: Family Medicine

## 2014-11-01 ENCOUNTER — Ambulatory Visit (HOSPITAL_BASED_OUTPATIENT_CLINIC_OR_DEPARTMENT_OTHER)
Admission: RE | Admit: 2014-11-01 | Discharge: 2014-11-01 | Disposition: A | Discharge status: Home / Self Care | Payer: Medicare Other | Source: Ambulatory Visit | Attending: Family Medicine | Admitting: Family Medicine

## 2014-11-01 ENCOUNTER — Encounter: Payer: Self-pay | Admitting: Family Medicine

## 2014-11-01 ENCOUNTER — Ambulatory Visit (INDEPENDENT_AMBULATORY_CARE_PROVIDER_SITE_OTHER): Payer: Medicare Other | Admitting: Family Medicine

## 2014-11-01 VITALS — BP 130/60 | HR 71 | Temp 98.1°F | Ht 62.0 in | Wt 210.2 lb

## 2014-11-01 DIAGNOSIS — Z0000 Encounter for general adult medical examination without abnormal findings: Secondary | ICD-10-CM

## 2014-11-01 DIAGNOSIS — M549 Dorsalgia, unspecified: Secondary | ICD-10-CM | POA: Diagnosis not present

## 2014-11-01 DIAGNOSIS — E876 Hypokalemia: Secondary | ICD-10-CM

## 2014-11-01 DIAGNOSIS — D508 Other iron deficiency anemias: Secondary | ICD-10-CM

## 2014-11-01 DIAGNOSIS — G4733 Obstructive sleep apnea (adult) (pediatric): Secondary | ICD-10-CM

## 2014-11-01 DIAGNOSIS — Z23 Encounter for immunization: Secondary | ICD-10-CM

## 2014-11-01 DIAGNOSIS — M546 Pain in thoracic spine: Secondary | ICD-10-CM

## 2014-11-01 DIAGNOSIS — E785 Hyperlipidemia, unspecified: Secondary | ICD-10-CM

## 2014-11-01 DIAGNOSIS — I10 Essential (primary) hypertension: Secondary | ICD-10-CM

## 2014-11-01 LAB — CBC
HCT: 37.1 % (ref 36.0–46.0)
Hemoglobin: 12.2 g/dL (ref 12.0–15.0)
MCHC: 33 g/dL (ref 30.0–36.0)
MCV: 87.8 fl (ref 78.0–100.0)
Platelets: 296 10*3/uL (ref 150.0–400.0)
RBC: 4.23 Mil/uL (ref 3.87–5.11)
RDW: 13.7 % (ref 11.5–15.5)
WBC: 8.6 10*3/uL (ref 4.0–10.5)

## 2014-11-01 LAB — HEPATIC FUNCTION PANEL
ALT: 17 U/L (ref 0–35)
AST: 21 U/L (ref 0–37)
Albumin: 4.2 g/dL (ref 3.5–5.2)
Alkaline Phosphatase: 74 U/L (ref 39–117)
Bilirubin, Direct: 0 mg/dL (ref 0.0–0.3)
Total Bilirubin: 0.6 mg/dL (ref 0.2–1.2)
Total Protein: 6.9 g/dL (ref 6.0–8.3)

## 2014-11-01 LAB — RENAL FUNCTION PANEL
Albumin: 4.2 g/dL (ref 3.5–5.2)
BUN: 20 mg/dL (ref 6–23)
CO2: 27 mEq/L (ref 19–32)
Calcium: 8.5 mg/dL (ref 8.4–10.5)
Chloride: 94 mEq/L — ABNORMAL LOW (ref 96–112)
Creatinine, Ser: 0.7 mg/dL (ref 0.4–1.2)
GFR: 86.44 mL/min (ref >60.00)
Glucose, Bld: 93 mg/dL (ref 70–99)
Phosphorus: 3.6 mg/dL (ref 2.3–4.6)
Potassium: 4.4 mEq/L (ref 3.5–5.1)
Sodium: 130 mEq/L — ABNORMAL LOW (ref 135–145)

## 2014-11-01 LAB — LIPID PANEL
Cholesterol: 144 mg/dL (ref 0–200)
HDL: 44.3 mg/dL (ref >39.00)
LDL Cholesterol: 71 mg/dL (ref 0–99)
NonHDL: 99.7
Total CHOL/HDL Ratio: 3
Triglycerides: 143 mg/dL (ref 0.0–149.0)
VLDL: 28.6 mg/dL (ref 0.0–40.0)

## 2014-11-01 LAB — TSH: TSH: 3.03 u[IU]/mL (ref 0.35–4.50)

## 2014-11-01 MED ORDER — TRAMADOL HCL 50 MG PO TABS: 50.0000 mg | ORAL_TABLET | Freq: Four times a day (QID) | ORAL | Status: DC | PRN

## 2014-11-01 NOTE — Progress Notes (Signed)
Pre visit review using our clinic review tool, if applicable. No additional management support is needed unless otherwise documented below in the visit note. 

## 2014-11-01 NOTE — Patient Instructions (Signed)
Preventive Care for Adults A healthy lifestyle and preventive care can promote health and wellness. Preventive health guidelines for women include the following key practices.  A routine yearly physical is a good way to check with your health care provider about your health and preventive screening. It is a chance to share any concerns and updates on your health and to receive a thorough exam.  Visit your dentist for a routine exam and preventive care every 6 months. Brush your teeth twice a day and floss once a day. Good oral hygiene prevents tooth decay and gum disease.  The frequency of eye exams is based on your age, health, family medical history, use of contact lenses, and other factors. Follow your health care provider's recommendations for frequency of eye exams.  Eat a healthy diet. Foods like vegetables, fruits, whole grains, low-fat dairy products, and lean protein foods contain the nutrients you need without too many calories. Decrease your intake of foods high in solid fats, added sugars, and salt. Eat the right amount of calories for you.Get information about a proper diet from your health care provider, if necessary.  Regular physical exercise is one of the most important things you can do for your health. Most adults should get at least 150 minutes of moderate-intensity exercise (any activity that increases your heart rate and causes you to sweat) each week. In addition, most adults need muscle-strengthening exercises on 2 or more days a week.  Maintain a healthy weight. The body mass index (BMI) is a screening tool to identify possible weight problems. It provides an estimate of body fat based on height and weight. Your health care provider can find your BMI and can help you achieve or maintain a healthy weight.For adults 20 years and older:  A BMI below 18.5 is considered underweight.  A BMI of 18.5 to 24.9 is normal.  A BMI of 25 to 29.9 is considered overweight.  A BMI of  30 and above is considered obese.  Maintain normal blood lipids and cholesterol levels by exercising and minimizing your intake of saturated fat. Eat a balanced diet with plenty of fruit and vegetables. Blood tests for lipids and cholesterol should begin at age 76 and be repeated every 5 years. If your lipid or cholesterol levels are high, you are over 50, or you are at high risk for heart disease, you may need your cholesterol levels checked more frequently.Ongoing high lipid and cholesterol levels should be treated with medicines if diet and exercise are not working.  If you smoke, find out from your health care provider how to quit. If you do not use tobacco, do not start.  Lung cancer screening is recommended for adults aged 22-80 years who are at high risk for developing lung cancer because of a history of smoking. A yearly low-dose CT scan of the lungs is recommended for people who have at least a 30-pack-year history of smoking and are a current smoker or have quit within the past 15 years. A pack year of smoking is smoking an average of 1 pack of cigarettes a day for 1 year (for example: 1 pack a day for 30 years or 2 packs a day for 15 years). Yearly screening should continue until the smoker has stopped smoking for at least 15 years. Yearly screening should be stopped for people who develop a health problem that would prevent them from having lung cancer treatment.  If you are pregnant, do not drink alcohol. If you are breastfeeding,  be very cautious about drinking alcohol. If you are not pregnant and choose to drink alcohol, do not have more than 1 drink per day. One drink is considered to be 12 ounces (355 mL) of beer, 5 ounces (148 mL) of wine, or 1.5 ounces (44 mL) of liquor.  Avoid use of street drugs. Do not share needles with anyone. Ask for help if you need support or instructions about stopping the use of drugs.  High blood pressure causes heart disease and increases the risk of  stroke. Your blood pressure should be checked at least every 1 to 2 years. Ongoing high blood pressure should be treated with medicines if weight loss and exercise do not work.  If you are 3-86 years old, ask your health care provider if you should take aspirin to prevent strokes.  Diabetes screening involves taking a blood sample to check your fasting blood sugar level. This should be done once every 3 years, after age 67, if you are within normal weight and without risk factors for diabetes. Testing should be considered at a younger age or be carried out more frequently if you are overweight and have at least 1 risk factor for diabetes.  Breast cancer screening is essential preventive care for women. You should practice "breast self-awareness." This means understanding the normal appearance and feel of your breasts and may include breast self-examination. Any changes detected, no matter how small, should be reported to a health care provider. Women in their 8s and 30s should have a clinical breast exam (CBE) by a health care provider as part of a regular health exam every 1 to 3 years. After age 70, women should have a CBE every year. Starting at age 25, women should consider having a mammogram (breast X-ray test) every year. Women who have a family history of breast cancer should talk to their health care provider about genetic screening. Women at a high risk of breast cancer should talk to their health care providers about having an MRI and a mammogram every year.  Breast cancer gene (BRCA)-related cancer risk assessment is recommended for women who have family members with BRCA-related cancers. BRCA-related cancers include breast, ovarian, tubal, and peritoneal cancers. Having family members with these cancers may be associated with an increased risk for harmful changes (mutations) in the breast cancer genes BRCA1 and BRCA2. Results of the assessment will determine the need for genetic counseling and  BRCA1 and BRCA2 testing.  Routine pelvic exams to screen for cancer are no longer recommended for nonpregnant women who are considered low risk for cancer of the pelvic organs (ovaries, uterus, and vagina) and who do not have symptoms. Ask your health care provider if a screening pelvic exam is right for you.  If you have had past treatment for cervical cancer or a condition that could lead to cancer, you need Pap tests and screening for cancer for at least 20 years after your treatment. If Pap tests have been discontinued, your risk factors (such as having a new sexual partner) need to be reassessed to determine if screening should be resumed. Some women have medical problems that increase the chance of getting cervical cancer. In these cases, your health care provider may recommend more frequent screening and Pap tests.  The HPV test is an additional test that may be used for cervical cancer screening. The HPV test looks for the virus that can cause the cell changes on the cervix. The cells collected during the Pap test can be  tested for HPV. The HPV test could be used to screen women aged 30 years and older, and should be used in women of any age who have unclear Pap test results. After the age of 30, women should have HPV testing at the same frequency as a Pap test.  Colorectal cancer can be detected and often prevented. Most routine colorectal cancer screening begins at the age of 50 years and continues through age 75 years. However, your health care provider may recommend screening at an earlier age if you have risk factors for colon cancer. On a yearly basis, your health care provider may provide home test kits to check for hidden blood in the stool. Use of a small camera at the end of a tube, to directly examine the colon (sigmoidoscopy or colonoscopy), can detect the earliest forms of colorectal cancer. Talk to your health care provider about this at age 50, when routine screening begins. Direct  exam of the colon should be repeated every 5-10 years through age 75 years, unless early forms of pre-cancerous polyps or small growths are found.  People who are at an increased risk for hepatitis B should be screened for this virus. You are considered at high risk for hepatitis B if:  You were born in a country where hepatitis B occurs often. Talk with your health care provider about which countries are considered high risk.  Your parents were born in a high-risk country and you have not received a shot to protect against hepatitis B (hepatitis B vaccine).  You have HIV or AIDS.  You use needles to inject street drugs.  You live with, or have sex with, someone who has hepatitis B.  You get hemodialysis treatment.  You take certain medicines for conditions like cancer, organ transplantation, and autoimmune conditions.  Hepatitis C blood testing is recommended for all people born from 1945 through 1965 and any individual with known risks for hepatitis C.  Practice safe sex. Use condoms and avoid high-risk sexual practices to reduce the spread of sexually transmitted infections (STIs). STIs include gonorrhea, chlamydia, syphilis, trichomonas, herpes, HPV, and human immunodeficiency virus (HIV). Herpes, HIV, and HPV are viral illnesses that have no cure. They can result in disability, cancer, and death.  You should be screened for sexually transmitted illnesses (STIs) including gonorrhea and chlamydia if:  You are sexually active and are younger than 24 years.  You are older than 24 years and your health care provider tells you that you are at risk for this type of infection.  Your sexual activity has changed since you were last screened and you are at an increased risk for chlamydia or gonorrhea. Ask your health care provider if you are at risk.  If you are at risk of being infected with HIV, it is recommended that you take a prescription medicine daily to prevent HIV infection. This is  called preexposure prophylaxis (PrEP). You are considered at risk if:  You are a heterosexual woman, are sexually active, and are at increased risk for HIV infection.  You take drugs by injection.  You are sexually active with a partner who has HIV.  Talk with your health care provider about whether you are at high risk of being infected with HIV. If you choose to begin PrEP, you should first be tested for HIV. You should then be tested every 3 months for as long as you are taking PrEP.  Osteoporosis is a disease in which the bones lose minerals and strength   with aging. This can result in serious bone fractures or breaks. The risk of osteoporosis can be identified using a bone density scan. Women ages 65 years and over and women at risk for fractures or osteoporosis should discuss screening with their health care providers. Ask your health care provider whether you should take a calcium supplement or vitamin D to reduce the rate of osteoporosis.  Menopause can be associated with physical symptoms and risks. Hormone replacement therapy is available to decrease symptoms and risks. You should talk to your health care provider about whether hormone replacement therapy is right for you.  Use sunscreen. Apply sunscreen liberally and repeatedly throughout the day. You should seek shade when your shadow is shorter than you. Protect yourself by wearing long sleeves, pants, a wide-brimmed hat, and sunglasses year round, whenever you are outdoors.  Once a month, do a whole body skin exam, using a mirror to look at the skin on your back. Tell your health care provider of new moles, moles that have irregular borders, moles that are larger than a pencil eraser, or moles that have changed in shape or color.  Stay current with required vaccines (immunizations).  Influenza vaccine. All adults should be immunized every year.  Tetanus, diphtheria, and acellular pertussis (Td, Tdap) vaccine. Pregnant women should  receive 1 dose of Tdap vaccine during each pregnancy. The dose should be obtained regardless of the length of time since the last dose. Immunization is preferred during the 27th-36th week of gestation. An adult who has not previously received Tdap or who does not know her vaccine status should receive 1 dose of Tdap. This initial dose should be followed by tetanus and diphtheria toxoids (Td) booster doses every 10 years. Adults with an unknown or incomplete history of completing a 3-dose immunization series with Td-containing vaccines should begin or complete a primary immunization series including a Tdap dose. Adults should receive a Td booster every 10 years.  Varicella vaccine. An adult without evidence of immunity to varicella should receive 2 doses or a second dose if she has previously received 1 dose. Pregnant females who do not have evidence of immunity should receive the first dose after pregnancy. This first dose should be obtained before leaving the health care facility. The second dose should be obtained 4-8 weeks after the first dose.  Human papillomavirus (HPV) vaccine. Females aged 13-26 years who have not received the vaccine previously should obtain the 3-dose series. The vaccine is not recommended for use in pregnant females. However, pregnancy testing is not needed before receiving a dose. If a female is found to be pregnant after receiving a dose, no treatment is needed. In that case, the remaining doses should be delayed until after the pregnancy. Immunization is recommended for any person with an immunocompromised condition through the age of 26 years if she did not get any or all doses earlier. During the 3-dose series, the second dose should be obtained 4-8 weeks after the first dose. The third dose should be obtained 24 weeks after the first dose and 16 weeks after the second dose.  Zoster vaccine. One dose is recommended for adults aged 60 years or older unless certain conditions are  present.  Measles, mumps, and rubella (MMR) vaccine. Adults born before 1957 generally are considered immune to measles and mumps. Adults born in 1957 or later should have 1 or more doses of MMR vaccine unless there is a contraindication to the vaccine or there is laboratory evidence of immunity to   each of the three diseases. A routine second dose of MMR vaccine should be obtained at least 28 days after the first dose for students attending postsecondary schools, health care workers, or international travelers. People who received inactivated measles vaccine or an unknown type of measles vaccine during 1963-1967 should receive 2 doses of MMR vaccine. People who received inactivated mumps vaccine or an unknown type of mumps vaccine before 1979 and are at high risk for mumps infection should consider immunization with 2 doses of MMR vaccine. For females of childbearing age, rubella immunity should be determined. If there is no evidence of immunity, females who are not pregnant should be vaccinated. If there is no evidence of immunity, females who are pregnant should delay immunization until after pregnancy. Unvaccinated health care workers born before 1957 who lack laboratory evidence of measles, mumps, or rubella immunity or laboratory confirmation of disease should consider measles and mumps immunization with 2 doses of MMR vaccine or rubella immunization with 1 dose of MMR vaccine.  Pneumococcal 13-valent conjugate (PCV13) vaccine. When indicated, a person who is uncertain of her immunization history and has no record of immunization should receive the PCV13 vaccine. An adult aged 19 years or older who has certain medical conditions and has not been previously immunized should receive 1 dose of PCV13 vaccine. This PCV13 should be followed with a dose of pneumococcal polysaccharide (PPSV23) vaccine. The PPSV23 vaccine dose should be obtained at least 8 weeks after the dose of PCV13 vaccine. An adult aged 19  years or older who has certain medical conditions and previously received 1 or more doses of PPSV23 vaccine should receive 1 dose of PCV13. The PCV13 vaccine dose should be obtained 1 or more years after the last PPSV23 vaccine dose.  Pneumococcal polysaccharide (PPSV23) vaccine. When PCV13 is also indicated, PCV13 should be obtained first. All adults aged 65 years and older should be immunized. An adult younger than age 65 years who has certain medical conditions should be immunized. Any person who resides in a nursing home or long-term care facility should be immunized. An adult smoker should be immunized. People with an immunocompromised condition and certain other conditions should receive both PCV13 and PPSV23 vaccines. People with human immunodeficiency virus (HIV) infection should be immunized as soon as possible after diagnosis. Immunization during chemotherapy or radiation therapy should be avoided. Routine use of PPSV23 vaccine is not recommended for American Indians, [place] Natives, or people younger than 65 years unless there are medical conditions that require PPSV23 vaccine. When indicated, people who have unknown immunization and have no record of immunization should receive PPSV23 vaccine. One-time revaccination 5 years after the first dose of PPSV23 is recommended for people aged 19-64 years who have chronic kidney failure, nephrotic syndrome, asplenia, or immunocompromised conditions. People who received 1-2 doses of PPSV23 before age 65 years should receive another dose of PPSV23 vaccine at age 65 years or later if at least 5 years have passed since the previous dose. Doses of PPSV23 are not needed for people immunized with PPSV23 at or after age 65 years.  Meningococcal vaccine. Adults with asplenia or persistent complement component deficiencies should receive 2 doses of quadrivalent meningococcal conjugate (MenACWY-D) vaccine. The doses should be obtained at least 2 months apart.  Microbiologists working with certain meningococcal bacteria, military recruits, people at risk during an outbreak, and people who travel to or live in countries with a high rate of meningitis should be immunized. A first-year college student up through age   21 years who is living in a residence hall should receive a dose if she did not receive a dose on or after her 16th birthday. Adults who have certain high-risk conditions should receive one or more doses of vaccine.  Hepatitis A vaccine. Adults who wish to be protected from this disease, have certain high-risk conditions, work with hepatitis A-infected animals, work in hepatitis A research labs, or travel to or work in countries with a high rate of hepatitis A should be immunized. Adults who were previously unvaccinated and who anticipate close contact with an international adoptee during the first 60 days after arrival in the [place] from a country with a high rate of hepatitis A should be immunized.  Hepatitis B vaccine. Adults who wish to be protected from this disease, have certain high-risk conditions, may be exposed to blood or other infectious body fluids, are household contacts or sex partners of hepatitis B positive people, are clients or workers in certain care facilities, or travel to or work in countries with a high rate of hepatitis B should be immunized.  Haemophilus influenzae type b (Hib) vaccine. A previously unvaccinated person with asplenia or sickle cell disease or having a scheduled splenectomy should receive 1 dose of Hib vaccine. Regardless of previous immunization, a recipient of a hematopoietic stem cell transplant should receive a 3-dose series 6-12 months after her successful transplant. Hib vaccine is not recommended for adults with HIV infection. Preventive Services / Frequency Ages 64 to 68 years  Blood pressure check.** / Every 1 to 2 years.  Lipid and cholesterol check.** / Every 5 years beginning at age  22.  Clinical breast exam.** / Every 3 years for women in their 88s and 53s.  BRCA-related cancer risk assessment.** / For women who have family members with a BRCA-related cancer (breast, ovarian, tubal, or peritoneal cancers).  Pap test.** / Every 2 years from ages 90 through 51. Every 3 years starting at age 21 through age 56 or 3 with a history of 3 consecutive normal Pap tests.  HPV screening.** / Every 3 years from ages 24 through ages 1 to 46 with a history of 3 consecutive normal Pap tests.  Hepatitis C blood test.** / For any individual with known risks for hepatitis C.  Skin self-exam. / Monthly.  Influenza vaccine. / Every year.  Tetanus, diphtheria, and acellular pertussis (Tdap, Td) vaccine.** / Consult your health care provider. Pregnant women should receive 1 dose of Tdap vaccine during each pregnancy. 1 dose of Td every 10 years.  Varicella vaccine.** / Consult your health care provider. Pregnant females who do not have evidence of immunity should receive the first dose after pregnancy.  HPV vaccine. / 3 doses over 6 months, if 72 and younger. The vaccine is not recommended for use in pregnant females. However, pregnancy testing is not needed before receiving a dose.  Measles, mumps, rubella (MMR) vaccine.** / You need at least 1 dose of MMR if you were born in 1957 or later. You may also need a 2nd dose. For females of childbearing age, rubella immunity should be determined. If there is no evidence of immunity, females who are not pregnant should be vaccinated. If there is no evidence of immunity, females who are pregnant should delay immunization until after pregnancy.  Pneumococcal 13-valent conjugate (PCV13) vaccine.** / Consult your health care provider.  Pneumococcal polysaccharide (PPSV23) vaccine.** / 1 to 2 doses if you smoke cigarettes or if you have certain conditions.  Meningococcal vaccine.** /  1 dose if you are age 19 to 21 years and a first-year college  student living in a residence hall, or have one of several medical conditions, you need to get vaccinated against meningococcal disease. You may also need additional booster doses.  Hepatitis A vaccine.** / Consult your health care provider.  Hepatitis B vaccine.** / Consult your health care provider.  Haemophilus influenzae type b (Hib) vaccine.** / Consult your health care provider. Ages 40 to 64 years  Blood pressure check.** / Every 1 to 2 years.  Lipid and cholesterol check.** / Every 5 years beginning at age 20 years.  Lung cancer screening. / Every year if you are aged 55-80 years and have a 30-pack-year history of smoking and currently smoke or have quit within the past 15 years. Yearly screening is stopped once you have quit smoking for at least 15 years or develop a health problem that would prevent you from having lung cancer treatment.  Clinical breast exam.** / Every year after age 40 years.  BRCA-related cancer risk assessment.** / For women who have family members with a BRCA-related cancer (breast, ovarian, tubal, or peritoneal cancers).  Mammogram.** / Every year beginning at age 40 years and continuing for as long as you are in good health. Consult with your health care provider.  Pap test.** / Every 3 years starting at age 30 years through age 65 or 70 years with a history of 3 consecutive normal Pap tests.  HPV screening.** / Every 3 years from ages 30 years through ages 65 to 70 years with a history of 3 consecutive normal Pap tests.  Fecal occult blood test (FOBT) of stool. / Every year beginning at age 50 years and continuing until age 75 years. You may not need to do this test if you get a colonoscopy every 10 years.  Flexible sigmoidoscopy or colonoscopy.** / Every 5 years for a flexible sigmoidoscopy or every 10 years for a colonoscopy beginning at age 50 years and continuing until age 75 years.  Hepatitis C blood test.** / For all people born from 1945 through  1965 and any individual with known risks for hepatitis C.  Skin self-exam. / Monthly.  Influenza vaccine. / Every year.  Tetanus, diphtheria, and acellular pertussis (Tdap/Td) vaccine.** / Consult your health care provider. Pregnant women should receive 1 dose of Tdap vaccine during each pregnancy. 1 dose of Td every 10 years.  Varicella vaccine.** / Consult your health care provider. Pregnant females who do not have evidence of immunity should receive the first dose after pregnancy.  Zoster vaccine.** / 1 dose for adults aged 60 years or older.  Measles, mumps, rubella (MMR) vaccine.** / You need at least 1 dose of MMR if you were born in 1957 or later. You may also need a 2nd dose. For females of childbearing age, rubella immunity should be determined. If there is no evidence of immunity, females who are not pregnant should be vaccinated. If there is no evidence of immunity, females who are pregnant should delay immunization until after pregnancy.  Pneumococcal 13-valent conjugate (PCV13) vaccine.** / Consult your health care provider.  Pneumococcal polysaccharide (PPSV23) vaccine.** / 1 to 2 doses if you smoke cigarettes or if you have certain conditions.  Meningococcal vaccine.** / Consult your health care provider.  Hepatitis A vaccine.** / Consult your health care provider.  Hepatitis B vaccine.** / Consult your health care provider.  Haemophilus influenzae type b (Hib) vaccine.** / Consult your health care provider. Ages 65   years and over  Blood pressure check.** / Every 1 to 2 years.  Lipid and cholesterol check.** / Every 5 years beginning at age 22 years.  Lung cancer screening. / Every year if you are aged 73-80 years and have a 30-pack-year history of smoking and currently smoke or have quit within the past 15 years. Yearly screening is stopped once you have quit smoking for at least 15 years or develop a health problem that would prevent you from having lung cancer  treatment.  Clinical breast exam.** / Every year after age 4 years.  BRCA-related cancer risk assessment.** / For women who have family members with a BRCA-related cancer (breast, ovarian, tubal, or peritoneal cancers).  Mammogram.** / Every year beginning at age 40 years and continuing for as long as you are in good health. Consult with your health care provider.  Pap test.** / Every 3 years starting at age 9 years through age 34 or 91 years with 3 consecutive normal Pap tests. Testing can be stopped between 65 and 70 years with 3 consecutive normal Pap tests and no abnormal Pap or HPV tests in the past 10 years.  HPV screening.** / Every 3 years from ages 57 years through ages 64 or 45 years with a history of 3 consecutive normal Pap tests. Testing can be stopped between 65 and 70 years with 3 consecutive normal Pap tests and no abnormal Pap or HPV tests in the past 10 years.  Fecal occult blood test (FOBT) of stool. / Every year beginning at age 15 years and continuing until age 17 years. You may not need to do this test if you get a colonoscopy every 10 years.  Flexible sigmoidoscopy or colonoscopy.** / Every 5 years for a flexible sigmoidoscopy or every 10 years for a colonoscopy beginning at age 86 years and continuing until age 71 years.  Hepatitis C blood test.** / For all people born from [birth year] through 1967 and any individual with known risks for hepatitis C.  Osteoporosis screening.** / A one-time screening for women ages 83 years and over and women at risk for fractures or osteoporosis.  Skin self-exam. / Monthly.  Influenza vaccine. / Every year.  Tetanus, diphtheria, and acellular pertussis (Tdap/Td) vaccine.** / 1 dose of Td every 10 years.  Varicella vaccine.** / Consult your health care provider.  Zoster vaccine.** / 1 dose for adults aged 61 years or older.  Pneumococcal 13-valent conjugate (PCV13) vaccine.** / Consult your health care provider.  Pneumococcal  polysaccharide (PPSV23) vaccine.** / 1 dose for all adults aged 28 years and older.  Meningococcal vaccine.** / Consult your health care provider.  Hepatitis A vaccine.** / Consult your health care provider.  Hepatitis B vaccine.** / Consult your health care provider.  Haemophilus influenzae type b (Hib) vaccine.** / Consult your health care provider. ** Family history and personal history of risk and conditions may change your health care provider's recommendations. Document Released: 01/25/2002 Document Revised: 04/15/2014 Document Reviewed: 04/26/2011 [facility] Patient Information 2015 [place], [place]. This information is not intended to replace advice given to you by your health care provider. Make sure you discuss any questions you have with your health care provider.

## 2014-11-10 ENCOUNTER — Encounter: Payer: Self-pay | Admitting: Family Medicine

## 2014-11-10 NOTE — Progress Notes (Signed)
Jackie Stevens  [id number] 1946-03-21 11/10/2014      Progress Note-Follow Up  Subjective  Chief Complaint  Chief Complaint  Patient presents with  . Annual Exam    medicare wellness    HPI  Patient is a 68 y.o. female in today for routine medical care. In today for annual exam feeling well. Continues to struggle with ongoing thoracic back pain but it is manageable. She has not had recent illness. She does note some mild urinary frequency but no urgency or hematuria. No recent illness. Denies CP/palp/SOB/HA/congestion/fevers/GI or GU c/o. Taking meds as prescribed  Past Medical History  Diagnosis Date  . Allergic rhinitis   . GERD (gastroesophageal reflux disease)   . Hyperlipidemia   . Chicken pox as a child  . Measles as a child  . Insomnia   . Thyroid disease   . Constipation   . Diverticulitis 10/25/2012    pt. reports that a drain was placed - 09/2012    . Abnormal cervical cytology 10/25/2012    Follows with Dr Leavy Cella of Gyn  . Asthma   . Heart murmur   . Complication of anesthesia   . PONV (postoperative nausea and vomiting)   . HTN (hypertension)     stress test completed by Anselm Lis, diagnosed as GERD  . Thyroid cancer 1980's  . Hypothyroidism   . H/O hiatal hernia   . External hemorrhoid, bleeding     "sometimes" (01-17-2013)  . Arthritis     "knees; right thumb; shoulders" (01-17-13)  . Kidney stones 1970's    "passed on their own" (2013/01/17)  . OSA on CPAP   . UTI (urinary tract infection) 04/02/2013  . Anemia 10/06/2013  . Preventative health care 10/06/2013    [facility] Pneumovax in 2012  . Low back pain 06/09/2014  . Medicare annual wellness visit, subsequent 10/06/2013    [facility] Pneumovax in 2012     Past Surgical History  Procedure Laterality Date  . Thyroidectomy, partial  1988    "then did iodine to remove the rest" (January 17, 2013)  . Sigmoid resection / rectopexy  01-17-2013  . Colon surgery     . Tonsillectomy  1951?  Marland Kitchen Cholecystectomy  1990  . Vaginal hysterectomy  1970's    "still have my ovaries" (01-17-13)  . Dilation and curettage of uterus  1960's    "lots of them; had miscarriages" (2013/01/17)  . Transrectal drainage of pelvic abscess  10/27/2012  . 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 Cystoscopy with stent placement  2013/01/17    Procedure: CYSTOSCOPY WITH STENT PLACEMENT;  Surgeon: Hanley Ben, MD;  Location: [facility];  Service: Urology;  Laterality: N/A;    Family History  Problem Relation Age of Onset  . Heart disease Father   . Pneumonia Father   . Hypertension Father   . Hyperlipidemia Father   . Cancer Father     skin  . Stroke Father   . Cancer Paternal Aunt   . Alzheimer's disease Mother   . Heart disease Mother   . Emphysema Brother     marijuana and cigarettes  . Alcohol abuse Brother   . Hearing loss Brother   . Diabetes Maternal Grandmother   . Alzheimer's disease Paternal Grandmother   . Cancer Paternal Grandmother     lung?- smoker  . Hyperlipidemia  Paternal Grandmother   . Heart attack Paternal Grandfather   . Alcohol abuse Paternal Grandfather   . Neurofibromatosis Son     schwanomatosis  . Neurofibromatosis Son     swanomatosis    History   Social History  . Marital Status: Married    Spouse Name: N/A    Number of Children: N/A  . Years of Education: N/A   Occupational History  . Not on file.   Social History Main Topics  . Smoking status: Never Smoker   . Smokeless tobacco: Never Used  . Alcohol Use: Yes     Comment: 12/19/2012 "glass of wine q night q other month or so"  . Drug Use: No  . Sexual Activity: No   Other Topics Concern  . Not on file   Social History Narrative   Married    children    Current Outpatient Prescriptions on File Prior to Visit  Medication Sig Dispense Refill  . albuterol  (PROVENTIL HFA;VENTOLIN HFA) 108 (90 BASE) MCG/ACT inhaler Inhale 2 puffs into the lungs every 6 (six) hours as needed for wheezing. 1 Inhaler 2  . cetirizine (ZYRTEC) 10 MG tablet Take 1 tablet (10 mg total) by mouth daily. 30 tablet 11  . fluticasone (FLONASE) 50 MCG/ACT nasal spray Place 2 sprays into both nostrils daily. 16 g 2  . Ginger, Zingiber officinalis, (GINGER PO) Take by mouth.    . IRON PO Take 500 mg by mouth every other day.    Marland Kitchen KRILL OIL PO Take 500 mg by mouth.    . levothyroxine (SYNTHROID, LEVOTHROID) 150 MCG tablet Take 150-225 mcg by mouth daily. 1 tablet (150 mcg) every day except Sunday, 1.5 tablet (225 mcg) on Sunday    . naproxen (NAPROSYN) 375 MG tablet Take 1 tablet (375 mg total) by mouth 2 (two) times daily with a meal. 180 tablet 1  . NITROSTAT 0.4 MG SL tablet PLACE 1 TABLET UNDER THE TONGUE EVERY 5 MINUTES AS NEEDED FOR CHEST PAIN 25 tablet 0  . omeprazole (PRILOSEC OTC) 20 MG tablet Take 1 tablet (20 mg total) by mouth daily before breakfast. 90 tablet 1  . psyllium (REGULOID) 0.52 G capsule Take 0.52 g by mouth at bedtime.    . ranitidine (ZANTAC) 150 MG tablet Take 1 tablet (150 mg total) by mouth at bedtime. 90 tablet 1  . simvastatin (ZOCOR) 40 MG tablet Take 1 tablet (40 mg total) by mouth at bedtime. 90 tablet 1  . valsartan-hydrochlorothiazide (DIOVAN-HCT) 320-25 MG per tablet Take 1 tablet by mouth daily. 90 tablet 1   No current facility-administered medications on file prior to visit.    Allergies  Allergen Reactions  . Niacin Other (See Comments) and Cough    "cough til I threw up" (12/19/2012)  . Neomycin-Bacitracin Zn-Polymyx Rash    Polysporin- is tolerated     Review of Systems  Review of Systems  Constitutional: Negative for fever, chills and malaise/fatigue.  HENT: Negative for congestion, hearing loss and nosebleeds.   Eyes: Negative for discharge.  Respiratory: Negative for cough, sputum production, shortness of breath and wheezing.    Cardiovascular: Negative for chest pain, palpitations and leg swelling.  Gastrointestinal: Negative for heartburn, nausea, vomiting, abdominal pain, diarrhea, constipation and blood in stool.  Genitourinary: Negative for dysuria, urgency, frequency and hematuria.  Musculoskeletal: Positive for back pain. Negative for myalgias and falls.  Skin: Negative for rash.  Neurological: Negative for dizziness, tremors, sensory change, focal weakness, loss of consciousness, weakness  and headaches.  Endo/Heme/Allergies: Negative for polydipsia. Does not bruise/bleed easily.  Psychiatric/Behavioral: Negative for depression and suicidal ideas. The patient is not nervous/anxious and does not have insomnia.     Objective  BP 130/60 mmHg  Pulse 71  Temp(Src) 98.1 F (36.7 C) (Oral)  Ht 5\' 2"  (1.575 m)  Wt 210 lb 3.2 oz (95.346 kg)  BMI 38.44 kg/m2  SpO2 100%  Physical Exam  Physical Exam  Constitutional: She is oriented to person, place, and time and well-developed, well-nourished, and in no distress. No distress.  HENT:  Head: Normocephalic and atraumatic.  Right Ear: External ear normal.  Left Ear: External ear normal.  Nose: Nose normal.  Mouth/Throat: Oropharynx is clear and moist. No oropharyngeal exudate.  Eyes: Conjunctivae are normal. Pupils are equal, round, and reactive to light. Right eye exhibits no discharge. Left eye exhibits no discharge. No scleral icterus.  Neck: Normal range of motion. Neck supple. No thyromegaly present.  Cardiovascular: Normal rate, regular rhythm, normal heart sounds and intact distal pulses.   No murmur heard. Pulmonary/Chest: Effort normal and breath sounds normal. No respiratory distress. She has no wheezes. She has no rales.  Abdominal: Soft. Bowel sounds are normal. She exhibits no distension and no mass. There is no tenderness.  Musculoskeletal: Normal range of motion. She exhibits no edema or tenderness.  Lymphadenopathy:    She has no cervical  adenopathy.  Neurological: She is alert and oriented to person, place, and time. She has normal reflexes. No cranial nerve deficit. Coordination normal.  Skin: Skin is warm and dry. No rash noted. She is not diaphoretic.  Psychiatric: Mood, memory and affect normal.    Lab Results  Component Value Date   TSH 3.03 11/01/2014   Lab Results  Component Value Date   WBC 8.6 11/01/2014   HGB 12.2 11/01/2014   HCT 37.1 11/01/2014   MCV 87.8 11/01/2014   PLT 296.0 11/01/2014   Lab Results  Component Value Date   CREATININE 0.7 11/01/2014   BUN 20 11/01/2014   NA 130* 11/01/2014   K 4.4 11/01/2014   CL 94* 11/01/2014   CO2 27 11/01/2014   Lab Results  Component Value Date   ALT 17 11/01/2014   AST 21 11/01/2014   ALKPHOS 74 11/01/2014   BILITOT 0.6 11/01/2014   Lab Results  Component Value Date   CHOL 144 11/01/2014   Lab Results  Component Value Date   HDL 44.30 11/01/2014   Lab Results  Component Value Date   LDLCALC 71 11/01/2014   Lab Results  Component Value Date   TRIG 143.0 11/01/2014   Lab Results  Component Value Date   CHOLHDL 3 11/01/2014     Assessment & Plan  Essential hypertension Well controlled, no changes to meds. Encouraged heart healthy diet such as the DASH diet and exercise as tolerated.   Obstructive sleep apnea Using CPAP routinely  Medicare annual wellness visit, subsequent Patient denies any difficulties at home. No trouble with ADLs, depression or falls. No recent changes to vision or hearing. Is UTD with immunizations. Is UTD with screening. Discussed Advanced Directives, patient agrees to bring [place] copies of documents if can. Encouraged heart healthy diet, exercise as tolerated and adequate sleep. MGM done August of 2015, repeat in 1 year. Needs colonoscopy this year agrees to one soon. [facility] Pneumovax in 2012 Dr Orvan Seen pap Dr Benson [place], Dr Sharlett Iles gastroenterology Dr Forde Dandy

## 2014-11-10 NOTE — Assessment & Plan Note (Signed)
Using CPAP routinely 

## 2014-11-10 NOTE — Assessment & Plan Note (Addendum)
Patient denies any difficulties at home. No trouble with ADLs, depression or falls. No recent changes to vision or hearing. Is UTD with immunizations. Is UTD with screening. Discussed Advanced Directives, patient agrees to bring [place] copies of documents if can. Encouraged heart healthy diet, exercise as tolerated and adequate sleep. MGM done August of 2015, repeat in 1 year. Needs colonoscopy this year agrees to one soon. [facility] Pneumovax in 2012 Dr Orvan Seen pap Dr Benson [place], Dr Sharlett Iles gastroenterology Dr Forde Dandy

## 2014-11-10 NOTE — Assessment & Plan Note (Signed)
Well controlled, no changes to meds. Encouraged heart healthy diet such as the DASH diet and exercise as tolerated.  °

## 2014-12-09 ENCOUNTER — Telehealth: Payer: Self-pay | Admitting: Family Medicine

## 2014-12-09 ENCOUNTER — Other Ambulatory Visit: Payer: Self-pay | Admitting: Family Medicine

## 2014-12-09 DIAGNOSIS — I10 Essential (primary) hypertension: Secondary | ICD-10-CM

## 2014-12-09 MED ORDER — VALSARTAN-HYDROCHLOROTHIAZIDE 320-25 MG PO TABS: 1.0000 | ORAL_TABLET | Freq: Every day | ORAL | Status: DC

## 2014-12-09 NOTE — Telephone Encounter (Signed)
Caller name: Jackie Stevens Relation to pt: self Call back number: Pharmacy: [facility]  Reason for call:   Needs valsartan refill

## 2014-12-09 NOTE — Telephone Encounter (Signed)
Rx sent to the pharmacy by e-script.//AB/CMA 

## 2015-01-03 ENCOUNTER — Telehealth: Payer: Self-pay | Admitting: Family Medicine

## 2015-01-06 MED ORDER — TRAMADOL HCL 50 MG PO TABS: 50.0000 mg | ORAL_TABLET | Freq: Four times a day (QID) | ORAL | Status: DC | PRN

## 2015-01-06 MED ORDER — RANITIDINE HCL 150 MG PO TABS: 150.0000 mg | ORAL_TABLET | Freq: Every day | ORAL | Status: DC

## 2015-01-06 NOTE — Telephone Encounter (Signed)
Ranitidine refill sent to pharmacy. Tramadol rx printed and forwarded to Provider for signature.

## 2015-01-27 MED ORDER — NAPROXEN 375 MG PO TABS: 375.0000 mg | ORAL_TABLET | Freq: Two times a day (BID) | ORAL | Status: DC

## 2015-01-27 MED ORDER — TRAMADOL HCL 50 MG PO TABS: 50.0000 mg | ORAL_TABLET | Freq: Four times a day (QID) | ORAL | Status: DC | PRN

## 2015-01-27 NOTE — Telephone Encounter (Addendum)
Caller name: Tammela, Bales Relation to pt: self  Call back number: [phone]   Reason for call:  Pt is checking on the status of Tramadol RX. Please advise

## 2015-01-27 NOTE — Telephone Encounter (Signed)
OK to refill Tramadol with same strength, same sig, same number

## 2015-01-27 NOTE — Addendum Note (Signed)
Addended by: Sharon Seller B on: 01/27/2015 02:40 PM   Modules accepted: Orders

## 2015-01-27 NOTE — Telephone Encounter (Signed)
Faxed hardcopy for Tramadol to [facility].  Patient informed .  Also upon request from the patient sent in refill of Naproxen.

## 2015-02-19 ENCOUNTER — Other Ambulatory Visit: Payer: Self-pay | Admitting: Family Medicine

## 2015-02-19 DIAGNOSIS — K219 Gastro-esophageal reflux disease without esophagitis: Secondary | ICD-10-CM

## 2015-02-19 MED ORDER — OMEPRAZOLE MAGNESIUM 20 MG PO TBEC: 20.0000 mg | DELAYED_RELEASE_TABLET | Freq: Every day | ORAL | Status: DC

## 2015-02-19 MED ORDER — SIMVASTATIN 40 MG PO TABS: 40.0000 mg | ORAL_TABLET | Freq: Every day | ORAL | Status: DC

## 2015-02-19 NOTE — Telephone Encounter (Signed)
Simvastatin 40 mg tab Last filled: 06/10/14 Amt: 90, 1 refill  Omeprazole 20 mg cap Last filled: 08/27/14 Amt: 90, 1 refill  Last OV: 11/01/14  Meds filled.

## 2015-02-19 NOTE — Telephone Encounter (Signed)
Caller name: Tani Relation to pt: self Call back number: [facility] [place]  Reason for call:   No longer using [facility].    Needs refills of simvastatin 40mg  tab. 90 day supply Omeprazole 20mg  cap. 90 day supply

## 2015-03-03 ENCOUNTER — Encounter: Payer: Self-pay | Admitting: Family Medicine

## 2015-03-03 ENCOUNTER — Ambulatory Visit (INDEPENDENT_AMBULATORY_CARE_PROVIDER_SITE_OTHER): Payer: Medicare Other | Admitting: Family Medicine

## 2015-03-03 VITALS — BP 140/78 | HR 69 | Temp 97.9°F | Resp 16 | Wt 212.5 lb

## 2015-03-03 DIAGNOSIS — E782 Mixed hyperlipidemia: Secondary | ICD-10-CM

## 2015-03-03 DIAGNOSIS — H60391 Other infective otitis externa, right ear: Secondary | ICD-10-CM | POA: Diagnosis not present

## 2015-03-03 DIAGNOSIS — G4733 Obstructive sleep apnea (adult) (pediatric): Secondary | ICD-10-CM

## 2015-03-03 DIAGNOSIS — K219 Gastro-esophageal reflux disease without esophagitis: Secondary | ICD-10-CM | POA: Diagnosis not present

## 2015-03-03 DIAGNOSIS — E871 Hypo-osmolality and hyponatremia: Secondary | ICD-10-CM

## 2015-03-03 DIAGNOSIS — K5900 Constipation, unspecified: Secondary | ICD-10-CM

## 2015-03-03 DIAGNOSIS — I10 Essential (primary) hypertension: Secondary | ICD-10-CM | POA: Diagnosis not present

## 2015-03-03 LAB — COMPREHENSIVE METABOLIC PANEL
ALT: 17 U/L (ref 0–35)
AST: 18 U/L (ref 0–37)
Albumin: 4 g/dL (ref 3.5–5.2)
Alkaline Phosphatase: 82 U/L (ref 39–117)
BUN: 11 mg/dL (ref 6–23)
CO2: 30 mEq/L (ref 19–32)
Calcium: 8.9 mg/dL (ref 8.4–10.5)
Chloride: 96 mEq/L (ref 96–112)
Creatinine, Ser: 0.71 mg/dL (ref 0.40–1.20)
GFR: 86.36 mL/min (ref >60.00)
Glucose, Bld: 86 mg/dL (ref 70–99)
Potassium: 3.9 mEq/L (ref 3.5–5.1)
Sodium: 132 mEq/L — ABNORMAL LOW (ref 135–145)
Total Bilirubin: 0.4 mg/dL (ref 0.2–1.2)
Total Protein: 6.9 g/dL (ref 6.0–8.3)

## 2015-03-03 MED ORDER — CIPROFLOXACIN-DEXAMETHASONE 0.3-0.1 % OT SUSP: 4.0000 [drp] | Freq: Two times a day (BID) | OTIC | Status: DC

## 2015-03-03 NOTE — Patient Instructions (Addendum)
Rel of records [facility], Dr Forde Dandy, last 2 office visits and labs  Rel of Rec Dr Benson Norway gastroenterology, last colonoscopy  Try Lotrimin twice daily to skin     Hyponatremia  Hyponatremia is when the amount of salt (sodium) in your blood is too low. When sodium levels are low, your cells will absorb extra water and swell. The swelling happens throughout the body, but it mostly affects the brain. Severe brain swelling (cerebral edema), seizures, or coma can happen.  CAUSES   Heart, kidney, or liver problems.  Thyroid problems.  Adrenal gland problems.  Severe vomiting and diarrhea.  Certain medicines or illegal drugs.  Dehydration.  Drinking too much water.  Low-sodium diet. SYMPTOMS   Nausea and vomiting.  Confusion.  Lethargy.  Agitation.  Headache.  Twitching or shaking (seizures).  Unconsciousness.  Appetite loss.  Muscle weakness and cramping. DIAGNOSIS  Hyponatremia is identified by a simple blood test. Your caregiver will perform a history and physical exam to try to find the cause and type of hyponatremia. Other tests may be needed to measure the amount of sodium in your blood and urine. TREATMENT  Treatment will depend on the cause.   Fluids may be given through the vein (IV).  Medicines may be used to correct the sodium imbalance. If medicines are causing the problem, they will need to be adjusted.  Water or fluid intake may be restricted to restore proper balance. The speed of correcting the sodium problem is very important. If the problem is corrected too fast, nerve damage (sometimes unchangeable) can happen. HOME CARE INSTRUCTIONS   Only take medicines as directed by your caregiver. Many medicines can make hyponatremia worse. Discuss all your medicines with your caregiver.  Carefully follow any recommended diet, including any fluid restrictions.  You may be asked to repeat lab tests. Follow these directions.  Avoid alcohol and  recreational drugs. SEEK MEDICAL CARE IF:   You develop worsening nausea, fatigue, headache, confusion, or weakness.  Your original hyponatremia symptoms return.  You have problems following the recommended diet. SEEK IMMEDIATE MEDICAL CARE IF:   You have a seizure.  You faint.  You have ongoing diarrhea or vomiting. MAKE SURE YOU:   Understand these instructions.  Will watch your condition.  Will get help right away if you are not doing well or get worse. Document Released: 11/19/2002 Document Revised: 02/21/2012 Document Reviewed: 05/16/2011 [facility] Patient Information 2015 [place], [place]. This information is not intended to replace advice given to you by your health care provider. Make sure you discuss any questions you have with your health care provider.   Body Ringworm Ringworm (tinea corporis) is a fungal infection of the skin on the body. This infection is not caused by worms, but is actually caused by a fungus. Fungus normally lives on the top of your skin and can be useful. However, in the case of ringworms, the fungus grows out of control and causes a skin infection. It can involve any area of skin on the body and can spread easily from one person to another (contagious). Ringworm is a common problem for children, but it can affect adults as well. Ringworm is also often found in athletes, especially wrestlers who share equipment and mats.  CAUSES  Ringworm of the body is caused by a fungus called dermatophyte. It can spread by:  Touchingother people who are infected.  Touchinginfected pets.  Touching or sharingobjects that have been in contact with the infected person or pet (hats, combs,  towels, clothing, sports equipment). SYMPTOMS   Itchy, raised red spots and bumps on the skin.  Ring-shaped rash.  Redness near the border of the rash with a clear center.  Dry and scaly skin on or around the rash. Not every person develops a ring-shaped rash. Some  develop only the red, scaly patches. DIAGNOSIS  Most often, ringworm can be diagnosed by performing a skin exam. Your caregiver may choose to take a skin scraping from the affected area. The sample will be examined under the microscope to see if the fungus is present.  TREATMENT  Body ringworm may be treated with a topical antifungal cream or ointment. Sometimes, an antifungal shampoo that can be used on your body is prescribed. You may be prescribed antifungal medicines to take by mouth if your ringworm is severe, keeps coming back, or lasts a long time.  HOME CARE INSTRUCTIONS   Only take over-the-counter or prescription medicines as directed by your caregiver.  Wash the infected area and dry it completely before applying yourcream or ointment.  When using antifungal shampoo to treat the ringworm, leave the shampoo on the body for 3-5 minutes before rinsing.   Wear loose clothing to stop clothes from rubbing and irritating the rash.  Wash or change your bed sheets every night while you have the rash.  Have your pet treated by your veterinarian if it has the same infection. To prevent ringworm:   Practice good hygiene.  Wear sandals or shoes in public places and showers.  Do not share personal items with others.  Avoid touching red patches of skin on other people.  Avoid touching pets that have bald spots or wash your hands after doing so. SEEK MEDICAL CARE IF:   Your rash continues to spread after 7 days of treatment.  Your rash is not gone in 4 weeks.  The area around your rash becomes red, warm, tender, and swollen. Document Released: 11/26/2000 Document Revised: 08/23/2012 Document Reviewed: 06/12/2012 [facility] Patient Information 2015 [place], [place]. This information is not intended to replace advice given to you by your health care provider. Make sure you discuss any questions you have with your health care provider.

## 2015-03-03 NOTE — Progress Notes (Signed)
Pre visit review using our clinic review tool, if applicable. No additional management support is needed unless otherwise documented below in the visit note. 

## 2015-03-05 ENCOUNTER — Telehealth: Payer: Self-pay | Admitting: Family Medicine

## 2015-03-05 MED ORDER — NEOMYCIN-POLYMYXIN-HC 1 % OT SOLN: 3.0000 [drp] | Freq: Two times a day (BID) | OTIC | Status: DC | PRN

## 2015-03-05 NOTE — Telephone Encounter (Signed)
Med filled and detailed message left to notify pt of use

## 2015-03-05 NOTE — Telephone Encounter (Signed)
Caller name:Freeland, Brittley Relation to WU:JWJX Call back number:[phone] [facility]  Reason for call: pt saw dr. Charlett Blake on Monday, and she was going to call her in a rx for some type of ear drops for her ears, pt states her pharmacy never received it and she would like for her to resend it.

## 2015-03-05 NOTE — Telephone Encounter (Signed)
Yes we did discuss ear drops. For recurrent Otitis externa, please send her in Cortisporin HC otic 3 drops b/l ears bid x 5 days prn OE

## 2015-03-10 ENCOUNTER — Telehealth: Payer: Self-pay | Admitting: *Deleted

## 2015-03-10 NOTE — Telephone Encounter (Signed)
Prior authorization for ciprodex initiated. Awaiting determination. JG//CMA

## 2015-03-11 ENCOUNTER — Telehealth: Payer: Self-pay | Admitting: Family Medicine

## 2015-03-11 NOTE — Telephone Encounter (Signed)
Jackie Stevens from [facility] because of non formulary. Two other alternatives need to be tried first.   [phone] option 4

## 2015-03-11 NOTE — Telephone Encounter (Signed)
Caller name: Fairy Relation to pt: self Call back number: [phone] Pharmacy:  Reason for call:   Patient states that she received a letter from [facility] that Valsartan is not a covered drug anymore and wants to know if this requires a prior auth?

## 2015-03-12 NOTE — Telephone Encounter (Signed)
PA submitted, awaiting determination. JG//CMA

## 2015-03-13 DIAGNOSIS — H60399 Other infective otitis externa, unspecified ear: Secondary | ICD-10-CM | POA: Insufficient documentation

## 2015-03-13 DIAGNOSIS — E871 Hypo-osmolality and hyponatremia: Secondary | ICD-10-CM | POA: Insufficient documentation

## 2015-03-13 NOTE — Assessment & Plan Note (Signed)
Avoid offending foods, start probiotics. Do not eat large meals in late evening and consider raising head of bed.  

## 2015-03-13 NOTE — Assessment & Plan Note (Signed)
Started on ear drops for right ear and report if symptoms worsen

## 2015-03-13 NOTE — Assessment & Plan Note (Addendum)
Follows with pulmonology, using CPAP routinely

## 2015-03-13 NOTE — Progress Notes (Signed)
Jackie Stevens  [id number] 10-Feb-1946 03/13/2015      Progress Note-Follow Up  Subjective  Chief Complaint  Chief Complaint  Patient presents with  . Follow-up    4 month    HPI  Patient is a 69 y.o. female in today for routine medical care. Patient is in today for follow-up. Generally doing well. Does note she is having frequent bowel movements daily but they're small and hard sometimes painful to pass. No bloody or tarry stool. She's had [place] like rash on her stomach this past month but it is improving it has been mildly pruritic at times. Continues to struggle with increased congestion at night when she lies down and a sense of postnasal drip. Finally complains of some itching but no pain or discharge in her right ear intermittently for several weeks. Has not tried any over-the-counter medications for this. No fevers or chills. Denies CP/palp/SOB/HA/congestion/fevers/GI or GU c/o. Taking meds as prescribed  Past Medical History  Diagnosis Date  . Allergic rhinitis   . GERD (gastroesophageal reflux disease)   . Hyperlipidemia   . Chicken pox as a child  . Measles as a child  . Insomnia   . Thyroid disease   . Constipation   . Diverticulitis 10/25/2012    pt. reports that a drain was placed - 09/2012    . Abnormal cervical cytology 10/25/2012    Follows with Dr Leavy Cella of Gyn  . Asthma   . Heart murmur   . Complication of anesthesia   . PONV (postoperative nausea and vomiting)   . HTN (hypertension)     stress test completed by Anselm Lis, diagnosed as GERD  . Thyroid cancer 1980's  . Hypothyroidism   . H/O hiatal hernia   . External hemorrhoid, bleeding     "sometimes" (01/11/2013)  . Arthritis     "knees; right thumb; shoulders" (01-11-13)  . Kidney stones 1970's    "passed on their own" (01/11/2013)  . OSA on CPAP   . UTI (urinary tract infection) 04/02/2013  . Anemia 10/06/2013  . Preventative health care 10/06/2013    [facility] Pneumovax  in 2012  . Low back pain 06/09/2014  . Medicare annual wellness visit, subsequent 10/06/2013    [facility] Pneumovax in 2012     Past Surgical History  Procedure Laterality Date  . Thyroidectomy, partial  1988    "then did iodine to remove the rest" (2013/01/11)  . Sigmoid resection / rectopexy  01/11/2013  . Colon surgery    . Tonsillectomy  1951?  Marland Kitchen Cholecystectomy  1990  . Vaginal hysterectomy  1970's    "still have my ovaries" (January 11, 2013)  . Dilation and curettage of uterus  1960's    "lots of them; had miscarriages" (01/11/13)  . Transrectal drainage of pelvic abscess  10/27/2012  . Colostomy revision  Jan 11, 2013    Procedure: COLON RESECTION SIGMOID;  Surgeon: Gwenyth Ober, MD;  Location: [place];  Service: General;  Laterality: N/A;  . Cystoscopy with stent placement  January 11, 2013    Procedure: CYSTOSCOPY WITH STENT PLACEMENT;  Surgeon: Hanley Ben, MD;  Location: [place];  Service: Urology;  Laterality: N/A;    Family History  Problem Relation Age of Onset  . Heart disease Father   . Pneumonia Father   . Hypertension Father   . Hyperlipidemia Father   . Cancer Father     skin  . Stroke Father   . Cancer Paternal Aunt   .  Alzheimer's disease Mother   . Heart disease Mother   . Emphysema Brother     marijuana and cigarettes  . Alcohol abuse Brother   . Hearing loss Brother   . Diabetes Maternal Grandmother   . Alzheimer's disease Paternal Grandmother   . Cancer Paternal Grandmother     lung?- smoker  . Hyperlipidemia Paternal Grandmother   . Heart attack Paternal Grandfather   . Alcohol abuse Paternal Grandfather   . Neurofibromatosis Son     schwanomatosis  . Neurofibromatosis Son     swanomatosis    History   Social History  . Marital Status: Married    Spouse Name: N/A  . Number of Children: N/A  . Years of Education: N/A   Occupational History  . Not on file.   Social History Main Topics  . Smoking status: Never Smoker   . Smokeless  tobacco: Never Used  . Alcohol Use: Yes     Comment: 12/19/2012 "glass of wine q night q other month or so"  . Drug Use: No  . Sexual Activity: No   Other Topics Concern  . Not on file   Social History Narrative   Married    children    Current Outpatient Prescriptions on File Prior to Visit  Medication Sig Dispense Refill  . cetirizine (ZYRTEC) 10 MG tablet Take 1 tablet (10 mg total) by mouth daily. 30 tablet 11  . fluticasone (FLONASE) 50 MCG/ACT nasal spray Place 2 sprays into both nostrils daily. 16 g 2  . IRON PO Take 500 mg by mouth every other day.    Marland Kitchen KRILL OIL PO Take 500 mg by mouth.    . levothyroxine (SYNTHROID, LEVOTHROID) 150 MCG tablet Take 150-225 mcg by mouth daily. 1 tablet (150 mcg) every day except Sunday, 1.5 tablet (225 mcg) on Sunday    . naproxen (NAPROSYN) 375 MG tablet Take 1 tablet (375 mg total) by mouth 2 (two) times daily with a meal. 180 tablet 1  . NITROSTAT 0.4 MG SL tablet PLACE 1 TABLET UNDER THE TONGUE EVERY 5 MINUTES AS NEEDED FOR CHEST PAIN 25 tablet 0  . omeprazole (PRILOSEC OTC) 20 MG tablet Take 1 tablet (20 mg total) by mouth daily before breakfast. 90 tablet 0  . psyllium (REGULOID) 0.52 G capsule Take 0.52 g by mouth at bedtime.    . ranitidine (ZANTAC) 150 MG tablet Take 1 tablet (150 mg total) by mouth at bedtime. 90 tablet 1  . simvastatin (ZOCOR) 40 MG tablet Take 1 tablet (40 mg total) by mouth at bedtime. 90 tablet 0  . traMADol (ULTRAM) 50 MG tablet Take 1 tablet (50 mg total) by mouth every 6 (six) hours as needed. 90 tablet 0  . valsartan-hydrochlorothiazide (DIOVAN-HCT) 320-25 MG per tablet Take 1 tablet by mouth daily. 90 tablet 1  . albuterol (PROVENTIL HFA;VENTOLIN HFA) 108 (90 BASE) MCG/ACT inhaler Inhale 2 puffs into the lungs every 6 (six) hours as needed for wheezing. (Patient not taking: Reported on 03/03/2015) 1 Inhaler 2   No current facility-administered medications on file prior to visit.    Allergies  Allergen  Reactions  . Niacin Other (See Comments) and Cough    "cough til I threw up" (12/19/2012)  . Neomycin-Bacitracin Zn-Polymyx Rash    Polysporin- is tolerated     Review of Systems  Review of Systems  Constitutional: Negative for fever and malaise/fatigue.  HENT: Positive for congestion.   Eyes: Negative for discharge.  Respiratory: Negative for shortness  of breath.   Cardiovascular: Negative for chest pain, palpitations and leg swelling.  Gastrointestinal: Negative for nausea, abdominal pain and diarrhea.  Genitourinary: Negative for dysuria.  Musculoskeletal: Negative for falls.  Skin: Positive for itching and rash.  Neurological: Negative for loss of consciousness and headaches.  Endo/Heme/Allergies: Negative for polydipsia.  Psychiatric/Behavioral: Negative for depression and suicidal ideas. The patient is not nervous/anxious and does not have insomnia.     Objective  BP 140/78 mmHg  Pulse 69  Temp(Src) 97.9 F (36.6 C) (Oral)  Resp 16  Wt 212 lb 8 oz (96.389 kg)  SpO2 97%  Physical Exam  Physical Exam  Constitutional: She is oriented to person, place, and time and well-developed, well-nourished, and in no distress. No distress.  HENT:  Head: Normocephalic and atraumatic.  Right external canal erythematous and moist with some break down of skin  Eyes: Conjunctivae are normal.  Neck: Neck supple. No thyromegaly present.  Cardiovascular: Normal rate, regular rhythm and normal heart sounds.   No murmur heard. Pulmonary/Chest: Effort normal and breath sounds normal. She has no wheezes.  Abdominal: She exhibits no distension and no mass.  Musculoskeletal: She exhibits no edema.  Lymphadenopathy:    She has no cervical adenopathy.  Neurological: She is alert and oriented to person, place, and time.  Skin: Skin is warm and dry. No rash noted. She is not diaphoretic.  Psychiatric: Memory, affect and judgment normal.    Lab Results  Component Value Date   TSH 3.03  11/01/2014   Lab Results  Component Value Date   WBC 8.6 11/01/2014   HGB 12.2 11/01/2014   HCT 37.1 11/01/2014   MCV 87.8 11/01/2014   PLT 296.0 11/01/2014   Lab Results  Component Value Date   CREATININE 0.71 03/03/2015   BUN 11 03/03/2015   NA 132* 03/03/2015   K 3.9 03/03/2015   CL 96 03/03/2015   CO2 30 03/03/2015   Lab Results  Component Value Date   ALT 17 03/03/2015   AST 18 03/03/2015   ALKPHOS 82 03/03/2015   BILITOT 0.4 03/03/2015   Lab Results  Component Value Date   CHOL 144 11/01/2014   Lab Results  Component Value Date   HDL 44.30 11/01/2014   Lab Results  Component Value Date   LDLCALC 71 11/01/2014   Lab Results  Component Value Date   TRIG 143.0 11/01/2014   Lab Results  Component Value Date   CHOLHDL 3 11/01/2014     Assessment & Plan  Essential hypertension Well controlled, no changes to meds. Encouraged heart healthy diet such as the DASH diet and exercise as tolerated.    G E R D Avoid offending foods, start probiotics. Do not eat large meals in late evening and consider raising head of bed.    Hyperlipidemia, mixed Tolerating statin, encouraged heart healthy diet, avoid trans fats, minimize simple carbs and saturated fats. Increase exercise as tolerated   Obstructive sleep apnea Follows with pulmonology, using CPAP routinely   Constipation Having frequent but small and hard BMs daily, encouraged fiber supplement and probiotics   Otitis, externa, infective Started on ear drops for right ear and report if symptoms worsen   Hyponatremia Improved since last check, asymptomatic, continue to monitor and restrict fluids slightly

## 2015-03-13 NOTE — Assessment & Plan Note (Signed)
Improved since last check, asymptomatic, continue to monitor and restrict fluids slightly

## 2015-03-13 NOTE — Assessment & Plan Note (Signed)
Well controlled, no changes to meds. Encouraged heart healthy diet such as the DASH diet and exercise as tolerated.  °

## 2015-03-13 NOTE — Assessment & Plan Note (Signed)
Having frequent but small and hard BMs daily, encouraged fiber supplement and probiotics

## 2015-03-13 NOTE — Assessment & Plan Note (Signed)
Tolerating statin, encouraged heart healthy diet, avoid trans fats, minimize simple carbs and saturated fats. Increase exercise as tolerated 

## 2015-03-16 ENCOUNTER — Emergency Department (HOSPITAL_COMMUNITY): Payer: Medicare Other

## 2015-03-16 ENCOUNTER — Encounter (HOSPITAL_COMMUNITY): Payer: Self-pay | Admitting: Nurse Practitioner

## 2015-03-16 ENCOUNTER — Emergency Department (HOSPITAL_COMMUNITY)
Admission: EM | Admit: 2015-03-16 | Discharge: 2015-03-16 | Disposition: A | Discharge status: Home / Self Care | Payer: Medicare Other | Attending: Emergency Medicine | Admitting: Emergency Medicine

## 2015-03-16 DIAGNOSIS — Z7951 Long term (current) use of inhaled steroids: Secondary | ICD-10-CM | POA: Insufficient documentation

## 2015-03-16 DIAGNOSIS — Z85850 Personal history of malignant neoplasm of thyroid: Secondary | ICD-10-CM | POA: Insufficient documentation

## 2015-03-16 DIAGNOSIS — E039 Hypothyroidism, unspecified: Secondary | ICD-10-CM | POA: Diagnosis not present

## 2015-03-16 DIAGNOSIS — E785 Hyperlipidemia, unspecified: Secondary | ICD-10-CM | POA: Insufficient documentation

## 2015-03-16 DIAGNOSIS — Z791 Long term (current) use of non-steroidal anti-inflammatories (NSAID): Secondary | ICD-10-CM | POA: Insufficient documentation

## 2015-03-16 DIAGNOSIS — K219 Gastro-esophageal reflux disease without esophagitis: Secondary | ICD-10-CM | POA: Diagnosis not present

## 2015-03-16 DIAGNOSIS — Z8669 Personal history of other diseases of the nervous system and sense organs: Secondary | ICD-10-CM | POA: Diagnosis not present

## 2015-03-16 DIAGNOSIS — I10 Essential (primary) hypertension: Secondary | ICD-10-CM | POA: Insufficient documentation

## 2015-03-16 DIAGNOSIS — Z7982 Long term (current) use of aspirin: Secondary | ICD-10-CM | POA: Diagnosis not present

## 2015-03-16 DIAGNOSIS — M1990 Unspecified osteoarthritis, unspecified site: Secondary | ICD-10-CM | POA: Diagnosis not present

## 2015-03-16 DIAGNOSIS — Z79899 Other long term (current) drug therapy: Secondary | ICD-10-CM | POA: Diagnosis not present

## 2015-03-16 DIAGNOSIS — J45909 Unspecified asthma, uncomplicated: Secondary | ICD-10-CM | POA: Insufficient documentation

## 2015-03-16 DIAGNOSIS — R011 Cardiac murmur, unspecified: Secondary | ICD-10-CM | POA: Insufficient documentation

## 2015-03-16 DIAGNOSIS — Z87440 Personal history of urinary (tract) infections: Secondary | ICD-10-CM | POA: Diagnosis not present

## 2015-03-16 DIAGNOSIS — D649 Anemia, unspecified: Secondary | ICD-10-CM | POA: Insufficient documentation

## 2015-03-16 DIAGNOSIS — E079 Disorder of thyroid, unspecified: Secondary | ICD-10-CM | POA: Diagnosis not present

## 2015-03-16 DIAGNOSIS — R200 Anesthesia of skin: Secondary | ICD-10-CM | POA: Diagnosis present

## 2015-03-16 DIAGNOSIS — Z8619 Personal history of other infectious and parasitic diseases: Secondary | ICD-10-CM | POA: Diagnosis not present

## 2015-03-16 DIAGNOSIS — Z9981 Dependence on supplemental oxygen: Secondary | ICD-10-CM | POA: Insufficient documentation

## 2015-03-16 DIAGNOSIS — R202 Paresthesia of skin: Secondary | ICD-10-CM | POA: Diagnosis not present

## 2015-03-16 DIAGNOSIS — Z87442 Personal history of urinary calculi: Secondary | ICD-10-CM | POA: Diagnosis not present

## 2015-03-16 LAB — PROTIME-INR
INR: 0.99 (ref 0.00–1.49)
Prothrombin Time: 13.2 seconds (ref 11.6–15.2)

## 2015-03-16 LAB — DIFFERENTIAL
Basophils Absolute: 0 10*3/uL (ref 0.0–0.1)
Basophils Relative: 0 % (ref 0–1)
Eosinophils Absolute: 0.3 10*3/uL (ref 0.0–0.7)
Eosinophils Relative: 4 % (ref 0–5)
Lymphocytes Relative: 26 % (ref 12–46)
Lymphs Abs: 2.2 10*3/uL (ref 0.7–4.0)
Monocytes Absolute: 0.6 10*3/uL (ref 0.1–1.0)
Monocytes Relative: 7 % (ref 3–12)
Neutro Abs: 5.1 10*3/uL (ref 1.7–7.7)
Neutrophils Relative %: 63 % (ref 43–77)

## 2015-03-16 LAB — COMPREHENSIVE METABOLIC PANEL
ALT: 20 U/L (ref 0–35)
AST: 23 U/L (ref 0–37)
Albumin: 4.2 g/dL (ref 3.5–5.2)
Alkaline Phosphatase: 85 U/L (ref 39–117)
Anion gap: 12 (ref 5–15)
BUN: 12 mg/dL (ref 6–23)
CO2: 26 mmol/L (ref 19–32)
Calcium: 8.9 mg/dL (ref 8.4–10.5)
Chloride: 96 mmol/L (ref 96–112)
Creatinine, Ser: 0.75 mg/dL (ref 0.50–1.10)
GFR calc Af Amer: >90 mL/min (ref >90)
GFR calc non Af Amer: 84 mL/min — ABNORMAL LOW (ref >90)
Glucose, Bld: 98 mg/dL (ref 70–99)
Potassium: 3.9 mmol/L (ref 3.5–5.1)
Sodium: 134 mmol/L — ABNORMAL LOW (ref 135–145)
Total Bilirubin: 0.6 mg/dL (ref 0.3–1.2)
Total Protein: 7.2 g/dL (ref 6.0–8.3)

## 2015-03-16 LAB — CBG MONITORING, ED: Glucose-Capillary: 95 mg/dL (ref 70–99)

## 2015-03-16 LAB — I-STAT TROPONIN, ED: Troponin i, poc: 0 ng/mL (ref 0.00–0.08)

## 2015-03-16 LAB — CBC
HCT: 38.3 % (ref 36.0–46.0)
Hemoglobin: 12.9 g/dL (ref 12.0–15.0)
MCH: 29.1 pg (ref 26.0–34.0)
MCHC: 33.7 g/dL (ref 30.0–36.0)
MCV: 86.3 fL (ref 78.0–100.0)
Platelets: 296 10*3/uL (ref 150–400)
RBC: 4.44 MIL/uL (ref 3.87–5.11)
RDW: 13.1 % (ref 11.5–15.5)
WBC: 8.2 10*3/uL (ref 4.0–10.5)

## 2015-03-16 LAB — APTT: aPTT: 35 seconds (ref 24–37)

## 2015-03-16 NOTE — Discharge Instructions (Signed)
Please follow-up with your PCP if symptoms continue to persist. Return immediately if new or worsening symptoms present. Please make sure that your CPAP is fitting appropriately

## 2015-03-16 NOTE — ED Provider Notes (Signed)
Patient's care was initially managed by Hebert Soho PA-C complete physical exam and diagnostic studies. Patient care was handed over to me at shift change pending CT MRI results. Patient presented this morning after numbness to the lateral aspect of her face. She denied any other associated signs or symptoms and clearing slurred speech, changes in cognition, numbness or tingling, or any other weaknesses. Patient's MRI, and CT showed no abnormalities in addition to some cervical spondylosis. Patient reports that she feels this numbness may be as a result of wearing her CPAP too tight last night, waking up this morning with deeper more extension normally has. At time of discharge patient had no complaints reporting minor numbness to the mentioned area. No need for further evaluation, management, additional consults at this time. Patient understood and agreed to follow-up immediately if any concerning signs or symptoms presented. Her, her husband, and her son understood and agreed to the plan. She was instructed follow-up with her primary care provider inform them of her recent ED visit and all relevant information, with a scheduled recheck of symptoms in 1 week.  Okey Regal, PA-C 03/16/15 [place], MD 03/16/15 1902

## 2015-03-16 NOTE — ED Notes (Signed)
Patient transported to CT 

## 2015-03-16 NOTE — ED Provider Notes (Signed)
Medical screening examination/treatment/procedure(s) were conducted as a shared visit with non-physician practitioner(s) and myself.  I personally evaluated the patient during the encounter.   EKG Interpretation   Date/Time:  Sunday March 16 2015 15:01:17 EDT Ventricular Rate:  77 PR Interval:  196 QRS Duration: 116 QT Interval:  406 QTC Calculation: 459 R Axis:   -45 Text Interpretation:  Normal sinus rhythm Left axis deviation Incomplete  left bundle branch block Abnormal ECG Confirmed by Hazle Coca [phone]) on  03/16/2015 4:18:29 PM      Patient with history of hypertension and hyperlipidemia here for evaluation of left-sided facial numbness. She is neurologically intact on exam except for objective decreased sensation on the left face. CT scan without any acute abnormalities. Plan to check MRI to further evaluate.  Quintella Reichert, MD 03/16/15 1902

## 2015-03-16 NOTE — ED Provider Notes (Signed)
CSN: [id number]     Arrival date & time 03/16/15  1453 History   First MD Initiated Contact with Patient 03/16/15 1504     Chief Complaint  Patient presents with  . Numbness     (Consider location/radiation/quality/duration/timing/severity/associated sxs/prior Treatment) The history is provided by the patient. No language interpreter was used.  Jackie Stevens is a 69 y.o white female with history of htn and hyperlipidemia who presents with new onset left sided facial numbness when she woke up at 8:30 this morning.  She took an aspirin at 10am.  She has no pain now. Nothing makes it better or worse. She was able to do her normal daily activities according to herself and her husband. She has no history of TIA or CVA.  She does not take anticoags besides the aspirin. She denies any facial droop, vision changes, speech difficulty, weakness, syncope, headache, no chest pain, no abdominal pain, no urinary symptoms.   Past Medical History  Diagnosis Date  . Allergic rhinitis   . GERD (gastroesophageal reflux disease)   . Hyperlipidemia   . Chicken pox as a child  . Measles as a child  . Insomnia   . Thyroid disease   . Constipation   . Diverticulitis 10/25/2012    pt. reports that a drain was placed - 09/2012    . Abnormal cervical cytology 10/25/2012    Follows with Dr Leavy Cella of Gyn  . Asthma   . Heart murmur   . Complication of anesthesia   . PONV (postoperative nausea and vomiting)   . HTN (hypertension)     stress test completed by Anselm Lis, diagnosed as GERD  . Thyroid cancer 1980's  . Hypothyroidism   . H/O hiatal hernia   . External hemorrhoid, bleeding     "sometimes" (12/20/2012)  . Arthritis     "knees; right thumb; shoulders" (12-20-2012)  . Kidney stones 1970's    "passed on their own" (12/20/12)  . OSA on CPAP   . UTI (urinary tract infection) 04/02/2013  . Anemia 10/06/2013  . Preventative health care 10/06/2013    [facility] Pneumovax in 2012   . Low back pain 06/09/2014  . Medicare annual wellness visit, subsequent 10/06/2013    [facility] Pneumovax in 2012    Past Surgical History  Procedure Laterality Date  . Thyroidectomy, partial  1988    "then did iodine to remove the rest" (2012/12/20)  . Sigmoid resection / rectopexy  12/20/12  . Colon surgery    . Tonsillectomy  1951?  Marland Kitchen Cholecystectomy  1990  . Vaginal hysterectomy  1970's    "still have my ovaries" (12-20-12)  . Dilation and curettage of uterus  1960's    "lots of them; had miscarriages" (12-20-2012)  . Transrectal drainage of pelvic abscess  10/27/2012  . Colostomy revision  2012-12-20    Procedure: COLON RESECTION SIGMOID;  Surgeon: Gwenyth Ober, MD;  Location: [place];  Service: General;  Laterality: N/A;  . Cystoscopy with stent placement  2012-12-20    Procedure: CYSTOSCOPY WITH STENT PLACEMENT;  Surgeon: Hanley Ben, MD;  Location: [place];  Service: Urology;  Laterality: N/A;   Family History  Problem Relation Age of Onset  . Heart disease Father   . Pneumonia Father   . Hypertension Father   . Hyperlipidemia Father   . Cancer Father     skin  . Stroke Father   . Cancer Paternal Aunt   . Alzheimer's disease Mother   .  Heart disease Mother   . Emphysema Brother     marijuana and cigarettes  . Alcohol abuse Brother   . Hearing loss Brother   . Diabetes Maternal Grandmother   . Alzheimer's disease Paternal Grandmother   . Cancer Paternal Grandmother     lung?- smoker  . Hyperlipidemia Paternal Grandmother   . Heart attack Paternal Grandfather   . Alcohol abuse Paternal Grandfather   . Neurofibromatosis Son     schwanomatosis  . Neurofibromatosis Son     swanomatosis   History  Substance Use Topics  . Smoking status: Never Smoker   . Smokeless tobacco: Never Used  . Alcohol Use: Yes     Comment: 12/19/2012 "glass of wine q night q other month or so"   OB History    No data available     Review of Systems  Constitutional:  Negative for fever.  Eyes: Negative for photophobia.  All other systems reviewed and are negative.     Allergies  Niacin and Neomycin-bacitracin zn-polymyx  Home Medications   Prior to Admission medications   Medication Sig Start Date End Date Taking? Authorizing Provider  aspirin EC 81 MG tablet Take 81 mg by mouth at bedtime.   Yes Historical Provider, MD  cetirizine (ZYRTEC) 10 MG tablet Take 1 tablet (10 mg total) by mouth daily. 04/02/13  Yes Mosie Lukes, MD  fluticasone (FLONASE) 50 MCG/ACT nasal spray Place 2 sprays into both nostrils daily. Patient taking differently: Place 2 sprays into both nostrils daily as needed for allergies.  01/22/14  Yes Debbrah Alar, NP  KRILL OIL PO Take 500 mg by mouth daily.    Yes Historical Provider, MD  levothyroxine (SYNTHROID, LEVOTHROID) 150 MCG tablet Take 150-225 mcg by mouth daily. 1 tablet (150 mcg) every day except Sunday, 1.5 tablet (225 mcg) on Sunday   Yes Historical Provider, MD  naproxen (NAPROSYN) 375 MG tablet Take 1 tablet (375 mg total) by mouth 2 (two) times daily with a meal. 01/27/15  Yes Mosie Lukes, MD  omeprazole (PRILOSEC OTC) 20 MG tablet Take 1 tablet (20 mg total) by mouth daily before breakfast. 02/19/15  Yes Mosie Lukes, MD  Polyethyl Glycol-Propyl Glycol 0.4-0.3 % GEL Apply 1 drop to eye at bedtime.   Yes Historical Provider, MD  psyllium (REGULOID) 0.52 G capsule Take 0.52 g by mouth at bedtime.   Yes Historical Provider, MD  ranitidine (ZANTAC) 150 MG tablet Take 1 tablet (150 mg total) by mouth at bedtime. 01/06/15  Yes Mosie Lukes, MD  simvastatin (ZOCOR) 40 MG tablet Take 1 tablet (40 mg total) by mouth at bedtime. 02/19/15  Yes Mosie Lukes, MD  sodium chloride (OCEAN) 0.65 % SOLN nasal spray Place 1 spray into both nostrils at bedtime.   Yes Historical Provider, MD  traMADol (ULTRAM) 50 MG tablet Take 1 tablet (50 mg total) by mouth every 6 (six) hours as needed. Patient taking differently: Take 50  mg by mouth at bedtime.  01/27/15  Yes Mosie Lukes, MD  TURMERIC PO Take 1 tablet by mouth daily.    Yes Historical Provider, MD  valsartan-hydrochlorothiazide (DIOVAN-HCT) 320-25 MG per tablet Take 1 tablet by mouth daily. 12/09/14  Yes Mosie Lukes, MD  vitamin B-12 (CYANOCOBALAMIN) 1000 MCG tablet Take 2,500 mcg by mouth daily.   Yes Historical Provider, MD  albuterol (PROVENTIL HFA;VENTOLIN HFA) 108 (90 BASE) MCG/ACT inhaler Inhale 2 puffs into the lungs every 6 (six) hours as needed for  wheezing. Patient not taking: Reported on 03/03/2015 04/02/13   Mosie Lukes, MD  ciprofloxacin-dexamethasone [facility]) otic suspension Place 4 drops into the right ear 2 (two) times daily. Patient not taking: Reported on 03/16/2015 03/03/15   Mosie Lukes, MD  IRON PO Take 500 mg by mouth every other day.    Historical Provider, MD  NEOMYCIN-POLYMYXIN-HYDROCORTISONE (CORTISPORIN) 1 % SOLN otic solution Place 3 drops into both ears 2 (two) times daily as needed. For 5 days. Patient not taking: Reported on 03/16/2015 03/05/15   Mosie Lukes, MD  NITROSTAT 0.4 MG SL tablet PLACE 1 TABLET UNDER THE TONGUE EVERY 5 MINUTES AS NEEDED FOR CHEST PAIN 05/21/14   Mosie Lukes, MD   BP 140/61 mmHg  Pulse 71  Temp(Src) 97.6 F (36.4 C) (Oral)  Resp 12  Ht 5\' 2"  (1.575 m)  Wt 218 lb (98.884 kg)  BMI 39.86 kg/m2  SpO2 96% Physical Exam  Constitutional: She is oriented to person, place, and time. She appears well-developed and well-nourished.  HENT:  Head: Normocephalic and atraumatic.  Eyes: Conjunctivae are normal.  Neck: Normal range of motion. Neck supple.  Cardiovascular: Normal rate, regular rhythm and normal heart sounds.   Pulmonary/Chest: Effort normal and breath sounds normal.  Abdominal: Soft. There is no tenderness.  Musculoskeletal: Normal range of motion.  Neurological: She is alert and oriented to person, place, and time. She has normal strength. No sensory deficit.  Cranial nerves III-XII in  tact.  Normal heel to chin.  Normal finger to nose. No pronator drift. Good bilateral upper and lower extremity strength.    Skin: Skin is warm and dry.  Nursing note and vitals reviewed.   ED Course  Procedures (including critical care time) Labs Review Labs Reviewed  PROTIME-INR  APTT  CBC  DIFFERENTIAL  COMPREHENSIVE METABOLIC PANEL  CBG MONITORING, ED  I-STAT TROPOININ, ED    Imaging Review Ct Head (brain) Wo Contrast  03/16/2015   CLINICAL DATA:  Acute onset Left-sided facial numbness and retro-orbital pain.  EXAM: CT HEAD WITHOUT CONTRAST  TECHNIQUE: Contiguous axial images were obtained from the base of the skull through the vertex without intravenous contrast.  COMPARISON:  None.  FINDINGS: No evidence of intracranial hemorrhage, brain edema, or other signs of acute infarction. No evidence of intracranial mass lesion or mass effect.  No abnormal extraaxial fluid collections identified. Ventricles are normal in size. No skull abnormality identified.  IMPRESSION: Negative noncontrast head CT.   Electronically Signed   By: Earle Gell M.D.   On: 03/16/2015 16:03     EKG Interpretation   Date/Time:  Sunday March 16 2015 15:01:17 EDT Ventricular Rate:  77 PR Interval:  196 QRS Duration: 116 QT Interval:  406 QTC Calculation: 459 R Axis:   -45 Text Interpretation:  Normal sinus rhythm Left axis deviation Incomplete  left bundle branch block Abnormal ECG Confirmed by Hazle Coca [phone]) on  03/16/2015 4:18:29 PM      MDM   Final diagnoses:  None   Patient presents for left sided facial numbness that began 8 hours ago.  Her neuro exam is normal.  She denies any vision changes, headache, chest pain, shortness of breath, slurred speech, facial droop.   A CT head was ordered and normal.  Her labs are still pending.  The plan is to order an MRI since she has risk factors such as hyperlipidemia and htn.   I have discussed this patient with and transferred care of this  patient to  Dr. Ralene Bathe and Lalla Brothers, PA-C  If the MRI is negative and labs are normal she can be discharged home.     Ottie Glazier, PA-C 03/16/15 1634  Quintella Reichert, MD 03/24/15 [phone]

## 2015-03-16 NOTE — ED Notes (Addendum)
She reports difficulty sleeping the last 3 nights. When she woke this morning she noticed the left side of her face felt numb and she noticed some pain behind her eyes. En route to ED she began to feel a burning in her chest that resolved. she denies any weakness, trouble with her speech, confusion. She is A&Ox4, grips = bilateral, no arm drift or facial droop.

## 2015-03-17 NOTE — Telephone Encounter (Signed)
I am confused doesn't the attached note say they do not cover valsartan but also that one of the covered alternatives is valsartan?

## 2015-03-17 NOTE — Telephone Encounter (Signed)
PA denied. Covered alternatives include Valsartan, Hydrochlorothiazide, Telmisartan-HCTZ, Losartan-HCTZ, and Irbesartan-HCTZ. Please advise.

## 2015-03-18 ENCOUNTER — Encounter: Payer: Self-pay | Admitting: Family Medicine

## 2015-03-18 ENCOUNTER — Ambulatory Visit (INDEPENDENT_AMBULATORY_CARE_PROVIDER_SITE_OTHER): Payer: Medicare Other | Admitting: Family Medicine

## 2015-03-18 VITALS — BP 128/62 | HR 85 | Temp 98.0°F | Ht 62.0 in | Wt 211.5 lb

## 2015-03-18 DIAGNOSIS — R202 Paresthesia of skin: Secondary | ICD-10-CM | POA: Diagnosis not present

## 2015-03-18 DIAGNOSIS — K219 Gastro-esophageal reflux disease without esophagitis: Secondary | ICD-10-CM

## 2015-03-18 DIAGNOSIS — I10 Essential (primary) hypertension: Secondary | ICD-10-CM

## 2015-03-18 DIAGNOSIS — K5900 Constipation, unspecified: Secondary | ICD-10-CM | POA: Diagnosis not present

## 2015-03-18 MED ORDER — VALSARTAN 320 MG PO TABS: 320.0000 mg | ORAL_TABLET | Freq: Every day | ORAL | Status: DC

## 2015-03-18 MED ORDER — HYDROCHLOROTHIAZIDE 25 MG PO TABS: 25.0000 mg | ORAL_TABLET | Freq: Every day | ORAL | Status: DC

## 2015-03-18 NOTE — Telephone Encounter (Signed)
Perfect so give her a separate prescription for Valsartan and HCTZ with same strength, same sig, same number just 2 different prescriptions

## 2015-03-18 NOTE — Patient Instructions (Signed)

## 2015-03-18 NOTE — Progress Notes (Signed)
Pre visit review using our clinic review tool, if applicable. No additional management support is needed unless otherwise documented below in the visit note. 

## 2015-03-18 NOTE — Telephone Encounter (Signed)
Her insurance will cover Valsartan, but not Valsartan-HCTZ.

## 2015-03-18 NOTE — Telephone Encounter (Signed)
Med discontinued.

## 2015-03-23 ENCOUNTER — Encounter: Payer: Self-pay | Admitting: Family Medicine

## 2015-03-23 DIAGNOSIS — R202 Paresthesia of skin: Secondary | ICD-10-CM

## 2015-03-23 HISTORY — DX: Paresthesia of skin: R20.2

## 2015-03-23 NOTE — Assessment & Plan Note (Signed)
Encouraged increased hydration and fiber in diet. Daily probiotics. If bowels not moving can use MOM 2 tbls po in 4 oz of warm prune juice by mouth every 2-3 days. If no results then repeat in 4 hours with  Dulcolax suppository pr, may repeat again in 4 more hours as needed. Seek care if symptoms worsen. Consider daily Miralax and/or Dulcolax if symptoms persist.  

## 2015-03-23 NOTE — Assessment & Plan Note (Signed)
Was seen in ER 2 days ago. Imaging ruled out stroke or significant pathology. 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 Report worsening or new symptoms

## 2015-03-23 NOTE — Assessment & Plan Note (Signed)
Well controlled, no changes to meds. Encouraged heart healthy diet such as the DASH diet and exercise as tolerated.  °

## 2015-03-23 NOTE — Assessment & Plan Note (Signed)
Avoid offending foods, start probiotics. Do not eat large meals in late evening and consider raising head of bed.  

## 2015-03-23 NOTE — Progress Notes (Signed)
Jackie Stevens [id number] Aug 22, 1946 03/23/2015      Progress Note New Patient  Subjective  Chief Complaint  Chief Complaint  Patient presents with  . Follow-up    ED 03/16/2015, Parathesia, Pt states she is feeling somewhat better, saw Chiropracter this morning,     HPI  Patient is a 69 year old female in today for routine medical care. Had some URI symptoms  Last week. Congestion improved. Was in ED a couple days ago with left facial numbness. Imaging was negative for any acute pathology, symptoms improving. Denies CP/palp/SOB/HA/congestion/fevers/GI or GU c/o. Taking meds as prescribed   Past Medical History  Diagnosis Date  . Allergic rhinitis   . GERD (gastroesophageal reflux disease)   . Hyperlipidemia   . Chicken pox as a child  . Measles as a child  . Insomnia   . Thyroid disease   . Constipation   . Diverticulitis 10/25/2012    pt. reports that a drain was placed - 09/2012    . Abnormal cervical cytology 10/25/2012    Follows with Dr Leavy Cella of Gyn  . Asthma   . Heart murmur   . Complication of anesthesia   . PONV (postoperative nausea and vomiting)   . HTN (hypertension)     stress test completed by Anselm Lis, diagnosed as GERD  . Thyroid cancer 1980's  . Hypothyroidism   . H/O hiatal hernia   . External hemorrhoid, bleeding     "sometimes" (Jan 18, 2013)  . Arthritis     "knees; right thumb; shoulders" (01-18-13)  . Kidney stones 1970's    "passed on their own" (01/18/2013)  . OSA on CPAP   . UTI (urinary tract infection) 04/02/2013  . Anemia 10/06/2013  . Preventative health care 10/06/2013    [facility] Pneumovax in 2012  . Low back pain 06/09/2014  . Medicare annual wellness visit, subsequent 10/06/2013    [facility] Pneumovax in 2012     Past Surgical History  Procedure Laterality Date  . Thyroidectomy, partial  1988    "then did iodine to remove the rest" (Jan 18, 2013)  . Sigmoid resection / rectopexy  2013/01/18   . Colon surgery    . Tonsillectomy  1951?  Marland Kitchen Cholecystectomy  1990  . Vaginal hysterectomy  1970's    "still have my ovaries" (01/18/13)  . Dilation and curettage of uterus  1960's    "lots of them; had miscarriages" (01/18/2013)  . Transrectal drainage of pelvic abscess  10/27/2012  . Colostomy revision  18-Jan-2013    Procedure: COLON RESECTION SIGMOID;  Surgeon: Gwenyth Ober, MD;  Location: [facility];  Service: General;  Laterality: N/A;  . Cystoscopy with stent placement  01-18-2013    Procedure: CYSTOSCOPY WITH STENT PLACEMENT;  Surgeon: Hanley Ben, MD;  Location: [facility];  Service: Urology;  Laterality: N/A;    Family History  Problem Relation Age of Onset  . Heart disease Father   . Pneumonia Father   . Hypertension Father   . Hyperlipidemia Father   . Cancer Father     skin  . Stroke Father   . Cancer Paternal Aunt   . Alzheimer's disease Mother   . Heart disease Mother   . Emphysema Brother     marijuana and cigarettes  . Alcohol abuse Brother   . Hearing loss Brother   . Diabetes Maternal Grandmother   . Alzheimer's disease Paternal Grandmother   . Cancer Paternal Grandmother     lung?- smoker  .  Hyperlipidemia Paternal Grandmother   . Heart attack Paternal Grandfather   . Alcohol abuse Paternal Grandfather   . Neurofibromatosis Son     schwanomatosis  . Neurofibromatosis Son     swanomatosis    History   Social History  . Marital Status: Married    Spouse Name: N/A  . Number of Children: N/A  . Years of Education: N/A   Occupational History  . Not on file.   Social History Main Topics  . Smoking status: Never Smoker   . Smokeless tobacco: Never Used  . Alcohol Use: Yes     Comment: 12/19/2012 "glass of wine q night q other month or so"  . Drug Use: No  . Sexual Activity: No   Other Topics Concern  . Not on file   Social History Narrative   Married    children    Current Outpatient Prescriptions on File Prior to Visit  Medication Sig Dispense  Refill  . aspirin EC 81 MG tablet Take 81 mg by mouth at bedtime.    . cetirizine (ZYRTEC) 10 MG tablet Take 1 tablet (10 mg total) by mouth daily. 30 tablet 11  . fluticasone (FLONASE) 50 MCG/ACT nasal spray Place 2 sprays into both nostrils daily. (Patient taking differently: Place 2 sprays into both nostrils daily as needed for allergies. ) 16 g 2  . KRILL OIL PO Take 500 mg by mouth daily.     Marland Kitchen levothyroxine (SYNTHROID, LEVOTHROID) 150 MCG tablet Take 150-225 mcg by mouth daily. 1 tablet (150 mcg) every day except Sunday, 1.5 tablet (225 mcg) on Sunday    . naproxen (NAPROSYN) 375 MG tablet Take 1 tablet (375 mg total) by mouth 2 (two) times daily with a meal. 180 tablet 1  . omeprazole (PRILOSEC OTC) 20 MG tablet Take 1 tablet (20 mg total) by mouth daily before breakfast. 90 tablet 0  . Polyethyl Glycol-Propyl Glycol 0.4-0.3 % GEL Apply 1 drop to eye at bedtime.    . psyllium (REGULOID) 0.52 G capsule Take 0.52 g by mouth at bedtime.    . ranitidine (ZANTAC) 150 MG tablet Take 1 tablet (150 mg total) by mouth at bedtime. 90 tablet 1  . simvastatin (ZOCOR) 40 MG tablet Take 1 tablet (40 mg total) by mouth at bedtime. 90 tablet 0  . sodium chloride (OCEAN) 0.65 % SOLN nasal spray Place 1 spray into both nostrils at bedtime.    . traMADol (ULTRAM) 50 MG tablet Take 1 tablet (50 mg total) by mouth every 6 (six) hours as needed. (Patient taking differently: Take 50 mg by mouth at bedtime. ) 90 tablet 0  . TURMERIC PO Take 1 tablet by mouth daily.     . vitamin B-12 (CYANOCOBALAMIN) 1000 MCG tablet Take 2,500 mcg by mouth daily.    Marland Kitchen albuterol (PROVENTIL HFA;VENTOLIN HFA) 108 (90 BASE) MCG/ACT inhaler Inhale 2 puffs into the lungs every 6 (six) hours as needed for wheezing. (Patient not taking: Reported on 03/03/2015) 1 Inhaler 2  . IRON PO Take 500 mg by mouth every other day.    . NEOMYCIN-POLYMYXIN-HYDROCORTISONE (CORTISPORIN) 1 % SOLN otic solution Place 3 drops into both ears 2 (two) times  daily as needed. For 5 days. (Patient not taking: Reported on 03/16/2015) 10 mL 0  . NITROSTAT 0.4 MG SL tablet PLACE 1 TABLET UNDER THE TONGUE EVERY 5 MINUTES AS NEEDED FOR CHEST PAIN (Patient not taking: Reported on 03/18/2015) 25 tablet 0   No current facility-administered medications on  file prior to visit.    Allergies  Allergen Reactions  . Niacin Other (See Comments) and Cough    "cough til I threw up" (12/19/2012)  . Neomycin-Bacitracin Zn-Polymyx Rash    Polysporin- is tolerated     Review of Systems  Review of Systems  Constitutional: Negative for fever and malaise/fatigue.  HENT: Negative for congestion.   Eyes: Negative for discharge.  Respiratory: Negative for shortness of breath.   Cardiovascular: Negative for chest pain, palpitations and leg swelling.  Gastrointestinal: Negative for nausea, abdominal pain and diarrhea.  Genitourinary: Negative for dysuria.  Musculoskeletal: Negative for falls.  Skin: Negative for rash.  Neurological: Positive for sensory change. Negative for loss of consciousness and headaches.  Endo/Heme/Allergies: Negative for polydipsia.  Psychiatric/Behavioral: Negative for depression and suicidal ideas. The patient is not nervous/anxious and does not have insomnia.     Objective  BP 128/62 mmHg  Pulse 85  Temp(Src) 98 F (36.7 C) (Oral)  Ht 5\' 2"  (1.575 m)  Wt 211 lb 8 oz (95.936 kg)  BMI 38.67 kg/m2  SpO2 98%  Physical Exam  Physical Exam  Constitutional: She is oriented to person, place, and time and well-developed, well-nourished, and in no distress. No distress.  HENT:  Head: Normocephalic and atraumatic.  Eyes: Conjunctivae are normal.  Neck: Neck supple. No thyromegaly present.  Cardiovascular: Normal rate, regular rhythm and normal heart sounds.   No murmur heard. Pulmonary/Chest: Effort normal and breath sounds normal. She has no wheezes.  Abdominal: She exhibits no distension and no mass.  Musculoskeletal: She exhibits no  edema.  Lymphadenopathy:    She has no cervical adenopathy.  Neurological: She is alert and oriented to person, place, and time.  Skin: Skin is warm and dry. No rash noted. She is not diaphoretic.  Psychiatric: Memory, affect and judgment normal.       Assessment & Plan  Essential hypertension Well controlled, no changes to meds. Encouraged heart healthy diet such as the DASH diet and exercise as tolerated.    G E R D Avoid offending foods, start probiotics. Do not eat large meals in late evening and consider raising head of bed.    Constipation Encouraged increased hydration and fiber in diet. Daily probiotics. If bowels not moving can use MOM 2 tbls po in 4 oz of warm prune juice by mouth every 2-3 days. If no results then repeat in 4 hours with  Dulcolax suppository pr, may repeat again in 4 more hours as needed. Seek care if symptoms worsen. Consider daily Miralax and/or Dulcolax if symptoms persist.    Paresthesia Was seen in ER 2 days ago. Imaging ruled out stroke or significant pathology. 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 Report worsening or new symptoms

## 2015-03-29 ENCOUNTER — Other Ambulatory Visit: Payer: Self-pay | Admitting: Family Medicine

## 2015-04-25 ENCOUNTER — Other Ambulatory Visit: Payer: Self-pay | Admitting: Family Medicine

## 2015-04-28 ENCOUNTER — Other Ambulatory Visit: Payer: Self-pay | Admitting: Family Medicine

## 2015-04-28 MED ORDER — VALSARTAN 320 MG PO TABS: 320.0000 mg | ORAL_TABLET | Freq: Every day | ORAL | Status: DC

## 2015-04-28 MED ORDER — HYDROCHLOROTHIAZIDE 25 MG PO TABS: 25.0000 mg | ORAL_TABLET | Freq: Every day | ORAL | Status: DC

## 2015-04-28 NOTE — Telephone Encounter (Signed)
Last Refill 01/27/15  #90 with 0 Refills Last Office Visit 03/18/15 Advise on Tramadol refill in the Absence of PCP.

## 2015-04-28 NOTE — Telephone Encounter (Signed)
Faxed hardcopy for Tramadol to [facility] [place] 

## 2015-05-27 ENCOUNTER — Other Ambulatory Visit: Payer: Self-pay | Admitting: Family Medicine

## 2015-07-22 ENCOUNTER — Other Ambulatory Visit: Payer: Self-pay | Admitting: Obstetrics and Gynecology

## 2015-07-23 LAB — CYTOLOGY - PAP

## 2015-07-28 ENCOUNTER — Other Ambulatory Visit: Payer: Self-pay | Admitting: Family Medicine

## 2015-07-29 ENCOUNTER — Other Ambulatory Visit: Payer: Self-pay | Admitting: Family Medicine

## 2015-07-29 MED ORDER — TRAMADOL HCL 50 MG PO TABS: 50.0000 mg | ORAL_TABLET | Freq: Four times a day (QID) | ORAL | Status: DC | PRN

## 2015-07-29 NOTE — Telephone Encounter (Signed)
Requesting: Tramadol Contract none UDS  none Last OV  03/18/15 Last Refill  #90 with 0 refills on 04/28/15  [facility] [place]  Please Advise

## 2015-07-31 ENCOUNTER — Ambulatory Visit (INDEPENDENT_AMBULATORY_CARE_PROVIDER_SITE_OTHER): Payer: Medicare Other | Admitting: Internal Medicine

## 2015-07-31 ENCOUNTER — Encounter: Payer: Self-pay | Admitting: Internal Medicine

## 2015-07-31 VITALS — BP 132/60 | HR 66 | Ht 62.0 in | Wt 207.0 lb

## 2015-07-31 DIAGNOSIS — G4733 Obstructive sleep apnea (adult) (pediatric): Secondary | ICD-10-CM

## 2015-07-31 DIAGNOSIS — J4520 Mild intermittent asthma, uncomplicated: Secondary | ICD-10-CM | POA: Diagnosis not present

## 2015-07-31 NOTE — Telephone Encounter (Signed)
Water quality scientist for Tramadol and on counter for signature from PCP. Once signed will then fax hardcopy for [facility] to [facility] [place] at fax# [phone]

## 2015-07-31 NOTE — Patient Instructions (Addendum)
Order- DME [facility]  Download for CPAP pressure compliance      Add AirView if available    Dx OSA

## 2015-07-31 NOTE — Progress Notes (Signed)
Subjective:    Patient ID: Jackie Stevens, female    DOB: 07/13/1946, 69 y.o.   MRN: [id number]  HPI 07/26/11- followed for obstructive sleep apnea complicated by GERD, HBP, allergic rhinitis Last here 01/25/2011 Pending stress test for chest pain.  CPAP 10 Advanced- all night every night. It bothers her enough that she likes to leave the mask off after getting up for bathroom. Husband will tell her that she snores without it. We discussed comfort issues. Mostly she just doesn't like having something on her face.  07/25/12- 69 yoF never smoker followed for obstructive sleep apnea complicated by GERD, HBP, allergic rhinitis Wears CPAP10/Advanced  every night-except when having trouble with sleeping overall.  New complaint-sore throat x5 days. Ears itch without popping. Some watery nose and occasional sneeze with dry cough.  08/25/12- 69 yoF never smoker followed for obstructive sleep apnea complicated by GERD, HBP, allergic rhinitis ACUTE VISIT:dry cough-wheezing. Choke and gag wake her at night. Does not recognize indigestion or reflux. Codeine cough syrup lets her sleep through but does not stop the coughing according to her family. Persistent postnasal drip. Head of bed is elevated on brick. CPAP 10/Advanced is being used but cough interferes. Discussed history of thyroid cancer resected and treated with radioactive iodine 28 years ago.  07/25/13- 69 yoF never smoker followed for obstructive sleep apnea complicated by GERD, HBP, allergic rhinitis FOLLOWS FOR: pt reports wearing CPAP 10/ Advanced every night approx 6 hrs per night, tolerating pressures fine -- c/o occasional  discomfort at nose-- having trouble w [facility] would like to see about switching companies. Stress is affecting her sleep-husband had GI bleed.  07/25/14- 69 yoF never smoker followed for obstructive sleep apnea complicated by GERD, HBP, allergic rhinitis FOLLOWS FOR:  doing well with her CPAP 10/ [facility]PS.  still has some isses with  the cpap falling off and she has to readjust the mask.  She has preferred a nasal pillows mask but has never been entirely comfortable with any of the choices. We discussed oral appliances as an alternative and she is interested in learning more.  07/31/15- 69 yoF never smoker followed for obstructive sleep apnea complicated by GERD, HBP, allergic rhinitis Follows For: Wearing cpap 10/ [facility]PS 5-8 hrs/night -  (No download) - Mask fits good - Did not see Dr Ron Parker for dental appliance due to h/o problems with ears and throat She feels she is doing well with CPAP 10 worn all night every night. Breathing now was quite good. She expects to use rescue inhaler more in September as fall season begins.  Review of Systems-see HPI Constitutional:   No-   weight loss, night sweats, fevers, chills, fatigue, lassitude. HEENT:   No-  headaches, difficulty swallowing, tooth/dental problems, sore throat,         No-sneezing, itching, ear ache,no- nasal congestion, post nasal drip,  CV:  No-   chest pain, orthopnea, PND, swelling in lower extremities, anasarca, dizziness, palpitations Resp: No-   shortness of breath with exertion or at rest.              No-   productive cough,  No- non-productive cough,  No-  coughing up of blood.              No-   change in color of mucus.  No- wheezing.   Skin: No-   rash or lesions. GI:  No-   heartburn, indigestion, abdominal pain, nausea, vomiting,  GU:  MS:  No-  joint pain or swelling.   Neuro- nothing unusual Psych:  No- change in mood or affect. No depression or anxiety.  No memory loss.   Objective:   Physical Exam General- Alert, Oriented, Affect-appropriate, Distress- none acute  Overweight, talkative Skin- rash-none, lesions- none, excoriation- none Lymphadenopathy- none Head- atraumatic            Eyes- Gross vision intact, PERRLA, conjunctivae clear secretions            Ears- Hearing, canals slightly retracted, no fluid            Nose- Clear, No-Septal  dev, mucus, polyps, erosion, perforation             Throat- Mallampati III-IV , mucosa clear- not red , drainage- none, tonsils- atrophic, not hoarse Neck- flexible , trachea midline, no stridor , thyroid nl, carotid no bruit Chest - symmetrical excursion , unlabored           Heart/CV- RRR , no murmur , no gallop  , no rub, nl s1 s2                           - JVD- none , edema- none, stasis changes- none, varices- none           Lung- clear to P&A, wheeze- none, cough- none , dullness-none, rub- none           Chest wall-  Abd-  Br/ Gen/ Rectal- Not done, not indicated Extrem- cyanosis- none, clubbing, none, atrophy- none, strength- nl Neuro- grossly intact to observation   Assessment & Plan:

## 2015-08-01 ENCOUNTER — Telehealth: Payer: Self-pay | Admitting: Internal Medicine

## 2015-08-01 NOTE — Telephone Encounter (Signed)
Thanks for the update-will keep an eye out for the results to give to CY.

## 2015-08-01 NOTE — Assessment & Plan Note (Addendum)
Good compliance and control   plan-continue current settings

## 2015-08-01 NOTE — Telephone Encounter (Signed)
Christine from [facility] called and said that she received order for CPAP Compliance report.  She called and advised patient that she is coming by to get her SDcard to get download and she will send download as soon as she has received it.  FYI to [employer]

## 2015-08-01 NOTE — Assessment & Plan Note (Signed)
Good control for now. Asthma is not waking her and she is not needing to use rescue inhaler more than once a week. Anticipates more problems than fall season-discussed.

## 2015-08-04 LAB — HM MAMMOGRAPHY

## 2015-08-05 ENCOUNTER — Other Ambulatory Visit: Payer: Self-pay | Admitting: Family Medicine

## 2015-08-07 ENCOUNTER — Encounter: Payer: Self-pay | Admitting: Family Medicine

## 2015-08-08 LAB — HM DEXA SCAN: HM Dexa Scan: NORMAL

## 2015-08-12 ENCOUNTER — Other Ambulatory Visit: Payer: Self-pay | Admitting: Obstetrics and Gynecology

## 2015-08-12 DIAGNOSIS — N83201 Unspecified ovarian cyst, right side: Secondary | ICD-10-CM

## 2015-08-14 ENCOUNTER — Ambulatory Visit
Admission: RE | Admit: 2015-08-14 | Discharge: 2015-08-14 | Disposition: A | Discharge status: Home / Self Care | Payer: Medicare Other | Source: Ambulatory Visit | Attending: Obstetrics and Gynecology | Admitting: Obstetrics and Gynecology

## 2015-08-14 DIAGNOSIS — N83201 Unspecified ovarian cyst, right side: Secondary | ICD-10-CM

## 2015-08-19 ENCOUNTER — Telehealth: Payer: Self-pay | Admitting: Behavioral Health

## 2015-08-19 NOTE — Telephone Encounter (Signed)
Confirmed appointment with the patient for 08/20/15 at 1:00 PM.

## 2015-08-20 ENCOUNTER — Ambulatory Visit (INDEPENDENT_AMBULATORY_CARE_PROVIDER_SITE_OTHER): Payer: Medicare Other

## 2015-08-20 VITALS — BP 138/60 | HR 78 | Ht 62.0 in | Wt 211.2 lb

## 2015-08-20 DIAGNOSIS — Z0000 Encounter for general adult medical examination without abnormal findings: Secondary | ICD-10-CM

## 2015-08-20 DIAGNOSIS — Z23 Encounter for immunization: Secondary | ICD-10-CM | POA: Diagnosis not present

## 2015-08-20 NOTE — Patient Instructions (Addendum)
Visit your primary care provider regularly. Follow up with Dr. Abner Greenspan as scheduled.   Make time for family and friends.  Healthy relationships are important.  Take medications as directed by your provider.  Maintain a healthy weight and a trim waistline.  Eat healthy meals and snacks, rich in fruits, vegetables, whole grains and lean proteins.  Aim for 150 minutes of moderate physical activity each week.  Don't smoke.  Avoid alcohol or drink in moderation.  Manage stress through prayer, meditation or mindful relaxation.  Get seven to nine hours of quality sleep each night.    Bring in copy of Advanced Directive.   Fat and Cholesterol Control Diet Your diet has an affect on your fat and cholesterol levels in your blood and organs. Too much fat and cholesterol in your blood can affect your:  Heart.  Blood vessels (arteries, veins).  Gallbladder.  Liver.  Pancreas. CONTROL FAT AND CHOLESTEROL WITH DIET Certain foods raise cholesterol and others lower it. It is important to replace bad fats with other types of fat.  Do not eat:  Fatty meats, such as hot dogs and salami.  Stick margarine and some tub margarines that have "partially hydrogenated oils" in them.  Baked goods, such as cookies and crackers that have "partially hydrogenated oils" in them.  Saturated tropical oils, such as coconut and palm oil. Eat the following foods:  Round or loin cuts of red meat.  Chicken (without skin).  Fish.  Veal.  Ground [place] breast.  Shellfish.  Fruit, such as apples.  Vegetables, such as broccoli, potatoes, and carrots.  Beans, peas, and lentils (legumes).  Grains, such as barley, rice, couscous, and bulgar wheat.  Pasta (without cream sauces). Look for foods that are nonfat, low in fat, and low in cholesterol.  FIND FOODS THAT ARE LOWER IN FAT AND CHOLESTEROL  Find foods with soluble fiber and plant sterols (phytosterol). You should eat 2 grams a day of these  foods. These foods include:  Fruits.  Vegetables.  Whole grains.  Dried beans and peas.  Nuts and seeds.  Read package labels. Look for low-saturated fats, trans fat free, low-fat foods.  Choose cheese that have only 2 to 3 grams of saturated fat per ounce.  Use heart-healthy tub margarine that is free of trans fat or partially hydrogenated oil.  Avoid buying baked goods that have partially hydrogenated oils in them. Instead, buy baked goods made with whole grains (whole-wheat or whole oat flour). Avoid baked goods labeled with "flour" or "enriched flour."  Buy non-creamy canned soups with reduced salt and no added fats. PREPARING YOUR FOOD  Broil, bake, steam, or roast foods. Do not fry food.  Use non-stick cooking sprays.  Use lemon or herbs to flavor food instead of using butter or stick margarine.  Use nonfat yogurt, salsa, or low-fat dressings for salads. LOW-SATURATED FAT / LOW-FAT FOOD SUBSTITUTES  Meats / Saturated Fat (g)  Avoid: Steak, marbled (3 oz/85 g) / 11 g.  Choose: Steak, lean (3 oz/85 g) / 4 g.  Avoid: Hamburger (3 oz/85 g) / 7 g.  Choose: Hamburger, lean (3 oz/85 g) / 5 g.  Avoid: Ham (3 oz/85 g) / 6 g.  Choose: Ham, lean cut (3 oz/85 g) / 2.4 g.  Avoid: Chicken, with skin, dark meat (3 oz/85 g) / 4 g.  Choose: Chicken, skin removed, dark meat (3 oz/85 g) / 2 g.  Avoid: Chicken, with skin, light meat (3 oz/85 g) / 2.5 g.  Choose: Chicken, skin removed, light meat (3 oz/85 g) / 1 g. Dairy / Saturated Fat (g)  Avoid: Whole milk (1 cup) / 5 g.  Choose: Low-fat milk, 2% (1 cup) / 3 g.  Choose: Low-fat milk, 1% (1 cup) / 1.5 g.  Choose: Skim milk (1 cup) / 0.3 g.  Avoid: Hard cheese (1 oz/28 g) / 6 g.  Choose: Skim milk cheese (1 oz/28 g) / 2 to 3 g.  Avoid: Cottage cheese, 4% fat (1 cup) / 6.5 g.  Choose: Low-fat cottage cheese, 1% fat (1 cup) / 1.5 g.  Avoid: Ice cream (1 cup) / 9 g.  Choose: Sherbet (1 cup) / 2.5 g.  Choose:  Nonfat frozen yogurt (1 cup) / 0.3 g.  Choose: Frozen fruit bar / trace.  Avoid: Whipped cream (1 tbs) / 3.5 g.  Choose: Nondairy whipped topping (1 tbs) / 1 g. Condiments / Saturated Fat (g)  Avoid: Mayonnaise (1 tbs) / 2 g.  Choose: Low-fat mayonnaise (1 tbs) / 1 g.  Avoid: Butter (1 tbs) / 7 g.  Choose: Extra light margarine (1 tbs) / 1 g.  Avoid: Coconut oil (1 tbs) / 11.8 g.  Choose: Olive oil (1 tbs) / 1.8 g.  Choose: Corn oil (1 tbs) / 1.7 g.  Choose: Safflower oil (1 tbs) / 1.2 g.  Choose: Sunflower oil (1 tbs) / 1.4 g.  Choose: Soybean oil (1 tbs) / 2.4 g .  Choose: Canola oil (1 tbs) / 1 g. Document Released: 05/30/2012 Document Revised: 08/01/2013 Document Reviewed: 02/28/2014 [facility] Patient Information 2015 [place], [place]. This information is not intended to replace advice given to you by your health care provider. Make sure you discuss any questions you have with your health care provider.  Health Maintenance Adopting a healthy lifestyle and getting preventive care can go a long way to promote health and wellness. Talk with your health care provider about what schedule of regular examinations is right for you. This is a good chance for you to check in with your provider about disease prevention and staying healthy. In between checkups, there are plenty of things you can do on your own. Experts have done a lot of research about which lifestyle changes and preventive measures are most likely to keep you healthy. Ask your health care provider for more information. WEIGHT AND DIET  Eat a healthy diet  Be sure to include plenty of vegetables, fruits, low-fat dairy products, and lean protein.  Do not eat a lot of foods high in solid fats, added sugars, or salt.  Get regular exercise. This is one of the most important things you can do for your health.  Most adults should exercise for at least 150 minutes each week. The exercise should increase your heart rate  and make you sweat (moderate-intensity exercise).  Most adults should also do strengthening exercises at least twice a week. This is in addition to the moderate-intensity exercise.  Maintain a healthy weight  Body mass index (BMI) is a measurement that can be used to identify possible weight problems. It estimates body fat based on height and weight. Your health care provider can help determine your BMI and help you achieve or maintain a healthy weight.  For females 65 years of age and older:   A BMI below 18.5 is considered underweight.  A BMI of 18.5 to 24.9 is normal.  A BMI of 25 to 29.9 is considered overweight.  A BMI of 30 and above is  considered obese.  Watch levels of cholesterol and blood lipids  You should start having your blood tested for lipids and cholesterol at 71 years of age, then have this test every 5 years.  You may need to have your cholesterol levels checked more often if:  Your lipid or cholesterol levels are high.  You are older than 71 years of age.  You are at high risk for heart disease.  CANCER SCREENING   Lung Cancer  Lung cancer screening is recommended for adults 33-59 years old who are at high risk for lung cancer because of a history of smoking.  A yearly low-dose CT scan of the lungs is recommended for people who:  Currently smoke.  Have quit within the past 15 years.  Have at least a 30-pack-year history of smoking. A pack year is smoking an average of one pack of cigarettes a day for 1 year.  Yearly screening should continue until it has been 15 years since you quit.  Yearly screening should stop if you develop a health problem that would prevent you from having lung cancer treatment.  Breast Cancer  Practice breast self-awareness. This means understanding how your breasts normally appear and feel.  It also means doing regular breast self-exams. Let your health care provider know about any changes, no matter how small.  If  you are in your 20s or 30s, you should have a clinical breast exam (CBE) by a health care provider every 1-3 years as part of a regular health exam.  If you are 56 or older, have a CBE every year. Also consider having a breast X-ray (mammogram) every year.  If you have a family history of breast cancer, talk to your health care provider about genetic screening.  If you are at high risk for breast cancer, talk to your health care provider about having an MRI and a mammogram every year.  Breast cancer gene (BRCA) assessment is recommended for women who have family members with BRCA-related cancers. BRCA-related cancers include:  Breast.  Ovarian.  Tubal.  Peritoneal cancers.  Results of the assessment will determine the need for genetic counseling and BRCA1 and BRCA2 testing. Cervical Cancer Routine pelvic examinations to screen for cervical cancer are no longer recommended for nonpregnant women who are considered low risk for cancer of the pelvic organs (ovaries, uterus, and vagina) and who do not have symptoms. A pelvic examination may be necessary if you have symptoms including those associated with pelvic infections. Ask your health care provider if a screening pelvic exam is right for you.   The Pap test is the screening test for cervical cancer for women who are considered at risk.  If you had a hysterectomy for a problem that was not cancer or a condition that could lead to cancer, then you no longer need Pap tests.  If you are older than 65 years, and you have had normal Pap tests for the past 10 years, you no longer need to have Pap tests.  If you have had past treatment for cervical cancer or a condition that could lead to cancer, you need Pap tests and screening for cancer for at least 20 years after your treatment.  If you no longer get a Pap test, assess your risk factors if they change (such as having a new sexual partner). This can affect whether you should start being  screened again.  Some women have medical problems that increase their chance of getting cervical cancer. If this is  the case for you, your health care provider may recommend more frequent screening and Pap tests.  The human papillomavirus (HPV) test is another test that may be used for cervical cancer screening. The HPV test looks for the virus that can cause cell changes in the cervix. The cells collected during the Pap test can be tested for HPV.  The HPV test can be used to screen women 57 years of age and older. Getting tested for HPV can extend the interval between normal Pap tests from three to five years.  An HPV test also should be used to screen women of any age who have unclear Pap test results.  After 71 years of age, women should have HPV testing as often as Pap tests.  Colorectal Cancer  This type of cancer can be detected and often prevented.  Routine colorectal cancer screening usually begins at 71 years of age and continues through 71 years of age.  Your health care provider may recommend screening at an earlier age if you have risk factors for colon cancer.  Your health care provider may also recommend using home test kits to check for hidden blood in the stool.  A small camera at the end of a tube can be used to examine your colon directly (sigmoidoscopy or colonoscopy). This is done to check for the earliest forms of colorectal cancer.  Routine screening usually begins at age 63.  Direct examination of the colon should be repeated every 5-10 years through 71 years of age. However, you may need to be screened more often if early forms of precancerous polyps or small growths are found. Skin Cancer  Check your skin from head to toe regularly.  Tell your health care provider about any new moles or changes in moles, especially if there is a change in a mole's shape or color.  Also tell your health care provider if you have a mole that is larger than the size of a pencil  eraser.  Always use sunscreen. Apply sunscreen liberally and repeatedly throughout the day.  Protect yourself by wearing long sleeves, pants, a wide-brimmed hat, and sunglasses whenever you are outside. HEART DISEASE, DIABETES, AND HIGH BLOOD PRESSURE   Have your blood pressure checked at least every 1-2 years. High blood pressure causes heart disease and increases the risk of stroke.  If you are between 68 years and 82 years old, ask your health care provider if you should take aspirin to prevent strokes.  Have regular diabetes screenings. This involves taking a blood sample to check your fasting blood sugar level.  If you are at a normal weight and have a low risk for diabetes, have this test once every three years after 71 years of age.  If you are overweight and have a high risk for diabetes, consider being tested at a younger age or more often. PREVENTING INFECTION  Hepatitis B  If you have a higher risk for hepatitis B, you should be screened for this virus. You are considered at high risk for hepatitis B if:  You were born in a country where hepatitis B is common. Ask your health care provider which countries are considered high risk.  Your parents were born in a high-risk country, and you have not been immunized against hepatitis B (hepatitis B vaccine).  You have HIV or AIDS.  You use needles to inject street drugs.  You live with someone who has hepatitis B.  You have had sex with someone who has hepatitis  B.  You get hemodialysis treatment.  You take certain medicines for conditions, including cancer, organ transplantation, and autoimmune conditions. Hepatitis C  Blood testing is recommended for:  Everyone born from [birth year] through 1967.  Anyone with known risk factors for hepatitis C. Sexually transmitted infections (STIs)  You should be screened for sexually transmitted infections (STIs) including gonorrhea and chlamydia if:  You are sexually active and are  younger than 71 years of age.  You are older than 70 years of age and your health care provider tells you that you are at risk for this type of infection.  Your sexual activity has changed since you were last screened and you are at an increased risk for chlamydia or gonorrhea. Ask your health care provider if you are at risk.  If you do not have HIV, but are at risk, it may be recommended that you take a prescription medicine daily to prevent HIV infection. This is called pre-exposure prophylaxis (PrEP). You are considered at risk if:  You are sexually active and do not regularly use condoms or know the HIV status of your partner(s).  You take drugs by injection.  You are sexually active with a partner who has HIV. Talk with your health care provider about whether you are at high risk of being infected with HIV. If you choose to begin PrEP, you should first be tested for HIV. You should then be tested every 3 months for as long as you are taking PrEP.  PREGNANCY   If you are premenopausal and you may become pregnant, ask your health care provider about preconception counseling.  If you may become pregnant, take 400 to 800 micrograms (mcg) of folic acid every day.  If you want to prevent pregnancy, talk to your health care provider about birth control (contraception). OSTEOPOROSIS AND MENOPAUSE   Osteoporosis is a disease in which the bones lose minerals and strength with aging. This can result in serious bone fractures. Your risk for osteoporosis can be identified using a bone density scan.  If you are 21 years of age or older, or if you are at risk for osteoporosis and fractures, ask your health care provider if you should be screened.  Ask your health care provider whether you should take a calcium or vitamin D supplement to lower your risk for osteoporosis.  Menopause may have certain physical symptoms and risks.  Hormone replacement therapy may reduce some of these symptoms and  risks. Talk to your health care provider about whether hormone replacement therapy is right for you.  HOME CARE INSTRUCTIONS   Schedule regular health, dental, and eye exams.  Stay current with your immunizations.   Do not use any tobacco products including cigarettes, chewing tobacco, or electronic cigarettes.  If you are pregnant, do not drink alcohol.  If you are breastfeeding, limit how much and how often you drink alcohol.  Limit alcohol intake to no more than 1 drink per day for nonpregnant women. One drink equals 12 ounces of beer, 5 ounces of wine, or 1 ounces of hard liquor.  Do not use street drugs.  Do not share needles.  Ask your health care provider for help if you need support or information about quitting drugs.  Tell your health care provider if you often feel depressed.  Tell your health care provider if you have ever been abused or do not feel safe at home. Document Released: 06/14/2011 Document Revised: 04/15/2014 Document Reviewed: 10/31/2013 [facility] Patient Information 2015 [place], [place].  This information is not intended to replace advice given to you by your health care provider. Make sure you discuss any questions you have with your health care provider.

## 2015-08-20 NOTE — Progress Notes (Signed)
Pre visit review using our clinic review tool, if applicable. No additional management support is needed unless otherwise documented below in the visit note. 

## 2015-08-20 NOTE — Progress Notes (Signed)
Subjective:   Jackie Stevens is a 69 y.o. female who presents for Medicare Annual (Subsequent) preventive examination.  Review of Systems: No ROS  Sleep patterns:  Occasionally will have weeks where she goes to bed around 3 am due to not being able to sleep.  Otherwise normal.   Home Safety/Smoke Alarms:  Feels safe at home.  Lives with husband and son.  Smoke alarms present.   Firearm Safety: Discussed firearm safety.   Seat Belt Safety/Bike Helmet:  Always wears seat belt.     Counseling:   Eye Exam- Has appointment in November Dental-Goes twice a year Female:  Pap-07/22/15     Mammo-08/04/15, [facility]    Dexa scan-Done a few weeks ago, requested copy of report.     CCS- 12/20/13-Dr. Benson Norway  Immunization: UTD except flu vaccine.  Flu vaccine given today.      Objective:   Vitals: BP 138/60 mmHg  Pulse 78  Ht 5\' 2"  (1.575 m)  Wt 211 lb 3.2 oz (95.8 kg)  BMI 38.62 kg/m2  SpO2 97%  Tobacco History  Smoking status  . Never Smoker   Smokeless tobacco  . Never Used     Counseling given: Not Answered   Past Medical History  Diagnosis Date  . Allergic rhinitis   . GERD (gastroesophageal reflux disease)   . Hyperlipidemia   . Chicken pox as a child  . Measles as a child  . Insomnia   . Thyroid disease   . Constipation   . Diverticulitis 10/25/2012    pt. reports that a drain was placed - 09/2012    . Abnormal cervical cytology 10/25/2012    Follows with Dr Leavy Cella of Gyn  . Asthma   . Heart murmur   . Complication of anesthesia   . PONV (postoperative nausea and vomiting)   . HTN (hypertension)     stress test completed by Anselm Lis, diagnosed as GERD  . Thyroid cancer 1980's  . Hypothyroidism   . H/O hiatal hernia   . External hemorrhoid, bleeding     "sometimes" (2013-01-02)  . Arthritis     "knees; right thumb; shoulders" (Jan 02, 2013)  . Kidney stones 1970's    "passed on their own" (01/02/13)  . OSA on CPAP   . UTI (urinary tract infection)  04/02/2013  . Anemia 10/06/2013  . Preventative health care 10/06/2013    [facility] Pneumovax in 2012  . Low back pain 06/09/2014  . Medicare annual wellness visit, subsequent 10/06/2013    [facility] Pneumovax in 2012   . Paresthesia 03/23/2015    Left face   Past Surgical History  Procedure Laterality Date  . Thyroidectomy, partial  1988    "then did iodine to remove the rest" (2013-01-02)  . Sigmoid resection / rectopexy  2013-01-02  . Colon surgery    . Tonsillectomy  1951?  Marland Kitchen Cholecystectomy  1990  . Vaginal hysterectomy  1970's    "still have my ovaries" (01/02/13)  . Dilation and curettage of uterus  1960's    "lots of them; had miscarriages" (2013-01-02)  . Transrectal drainage of pelvic abscess  10/27/2012  . Colostomy revision  January 02, 2013    Procedure: COLON RESECTION SIGMOID;  Surgeon: Gwenyth Ober, MD;  Location: [facility];  Service: General;  Laterality: N/A;  . Cystoscopy with stent placement  01-02-2013    Procedure: CYSTOSCOPY WITH STENT PLACEMENT;  Surgeon: Hanley Ben, MD;  Location: [facility];  Service: Urology;  Laterality:  N/A;   Family History  Problem Relation Age of Onset  . Heart disease Father   . Pneumonia Father   . Hypertension Father   . Hyperlipidemia Father   . Cancer Father     skin  . Stroke Father   . Cancer Paternal Aunt   . Alzheimer's disease Mother   . Heart disease Mother   . Emphysema Brother     marijuana and cigarettes  . Alcohol abuse Brother   . Hearing loss Brother   . Diabetes Maternal Grandmother   . Alzheimer's disease Paternal Grandmother   . Cancer Paternal Grandmother     lung?- smoker  . Hyperlipidemia Paternal Grandmother   . Heart attack Paternal Grandfather   . Alcohol abuse Paternal Grandfather   . Neurofibromatosis Son     schwanomatosis  . Neurofibromatosis Son     swanomatosis   History  Sexual Activity  . Sexual Activity: No    Outpatient Encounter Prescriptions as of 08/20/2015    Medication Sig  . albuterol (PROVENTIL HFA;VENTOLIN HFA) 108 (90 BASE) MCG/ACT inhaler Inhale 2 puffs into the lungs every 6 (six) hours as needed for wheezing.  Marland Kitchen aspirin EC 81 MG tablet Take 81 mg by mouth at bedtime.  . cetirizine (ZYRTEC) 10 MG tablet Take 1 tablet (10 mg total) by mouth daily.  . fluticasone (FLONASE) 50 MCG/ACT nasal spray Place 2 sprays into both nostrils daily. (Patient taking differently: Place 2 sprays into both nostrils daily as needed for allergies. )  . hydrochlorothiazide (HYDRODIURIL) 25 MG tablet Take 1 tablet (25 mg total) by mouth daily.  Marland Kitchen KRILL OIL PO Take 500 mg by mouth daily.   Marland Kitchen levothyroxine (SYNTHROID, LEVOTHROID) 150 MCG tablet Take 150-225 mcg by mouth daily. 1 tablet (150 mcg) every day except Sunday, 1.5 tablet (225 mcg) on Sunday  . naproxen (NAPROSYN) 375 MG tablet TAKE 1 TABLET (375 MG TOTAL) BY MOUTH 2 (TWO) TIMES DAILY WITH A MEAL.  Marland Kitchen NEOMYCIN-POLYMYXIN-HYDROCORTISONE (CORTISPORIN) 1 % SOLN otic solution Place 3 drops into both ears 2 (two) times daily as needed. For 5 days.  Marland Kitchen NITROSTAT 0.4 MG SL tablet PLACE 1 TABLET UNDER THE TONGUE EVERY 5 MINUTES AS NEEDED FOR CHEST PAIN  . omeprazole (PRILOSEC) 20 MG capsule TAKE 1 CAPSULE EVERY MORNING AT BREAFAST  . Polyethyl Glycol-Propyl Glycol 0.4-0.3 % GEL Apply 1 drop to eye at bedtime.  . Probiotic Product (PROBIOTIC ADVANCED PO) Take 2 tablets by mouth daily. Digestive Advantage  . psyllium (REGULOID) 0.52 G capsule Take 0.52 g by mouth at bedtime.  . ranitidine (ZANTAC) 150 MG tablet TAKE 1 TABLET BY MOUTH AT BEDTIME  . senna (SENOKOT) 8.6 MG TABS tablet Take 1 tablet by mouth daily as needed for mild constipation.  . simvastatin (ZOCOR) 40 MG tablet TAKE 1 TABLET (40 MG TOTAL) BY MOUTH AT BEDTIME.  . sodium chloride (OCEAN) 0.65 % SOLN nasal spray Place 1 spray into both nostrils at bedtime.  . traMADol (ULTRAM) 50 MG tablet Take 1 tablet (50 mg total) by mouth every 6 (six) hours as needed.   . TURMERIC PO Take 1 tablet by mouth daily.   . valsartan (DIOVAN) 320 MG tablet Take 1 tablet (320 mg total) by mouth daily.  . vitamin B-12 (CYANOCOBALAMIN) 1000 MCG tablet Take 2,500 mcg by mouth daily.   No facility-administered encounter medications on file as of 08/20/2015.    Activities of Daily Living In your present state of health, do you have any difficulty  performing the following activities: 08/20/2015 03/03/2015  Hearing? N N  Vision? N N  Difficulty concentrating or making decisions? N N  Walking or climbing stairs? Y N  Dressing or bathing? N N  Doing errands, shopping? N N  Preparing Food and eating ? N -  Using the Toilet? N -  In the past six months, have you accidently leaked urine? Y -  Do you have problems with loss of bowel control? N -  Managing your Medications? N -  Managing your Finances? N -  Housekeeping or managing your Housekeeping? N -    Patient Care Team: Mosie Lukes, MD as PCP - General (Family Medicine) Marylynn Pearson, MD as Consulting Physician (Obstetrics and Gynecology) Marica Otter, OD as Consulting Physician (Optometry) Reynold Bowen, MD as Consulting Physician (Endocrinology) Gaynelle Arabian, MD as Consulting Physician (Orthopedic Surgery) Suszanne Finch, DC as Consulting Physician (Chiropractic Medicine) Sydnee Levans, MD as Consulting Physician (Dermatology)    Assessment:  BMI-38.62 kg/m2- Discussed healthy eating and exercise.  Hypertension-Stable, well controlled on medications.  Encouraged heart healthy diet and exercise.    Hyperlipidemia-Stable, well controlled on simvastatin.  Last Lipid Panel-11/01/14.  Following up with Dr. Charlett Blake.   Exercise Activities and Dietary recommendations Exercise- No structured exercise regimen.  Mostly yardwork and housework.  States just doesn't feel motivated to exercise. Encouraged exercise that patient enjoys (walking, yoga, swimming, etc.)  Diet- Prepares meals.  Loves vegetables.  Loves  sweets and knows she needs to cut back on eating them.    Goals    . Increase physical activity    . Lose 20 lbs       Fall Risk Fall Risk  08/20/2015 11/01/2014  Falls in the past year? No No   Depression Screen PHQ 2/9 Scores 08/20/2015 11/01/2014  PHQ - 2 Score 0 0     Cognitive Testing MMSE - Mini Mental State Exam 08/20/2015  Orientation to time 5  Orientation to Place 5  Registration 3  Attention/ Calculation 5  Recall 3  Language- name 2 objects 2  Language- repeat 1  Language- follow 3 step command 3  Language- read & follow direction 1  Write a sentence 1  Copy design 1  Total score 30    Immunization History  Administered Date(s) Administered  . Influenza Split 09/12/2012  . Influenza,inj,Quad PF,36+ Mos 08/30/2013, 10/21/2014, 08/20/2015  . Pneumococcal Conjugate-13 07/19/2011, 11/01/2014  . Tdap 10/02/2013  . Zoster 12/09/2008   Screening Tests Health Maintenance  Topic Date Due  . DEXA SCAN  09/13/2015 (Originally 08/10/2009)  . Hepatitis C Screening  11/13/2015 (Originally Feb 14, 1946)  . PNA vac Low Risk Adult (2 of 2 - PPSV23) 11/02/2015  . INFLUENZA VACCINE  07/13/2016  . MAMMOGRAM  08/03/2017  . TETANUS/TDAP  10/03/2023  . COLONOSCOPY  12/21/2023  . ZOSTAVAX  Completed      Plan:  Bring in copy of Advanced Directive.  Follow up with Dr. Charlett Blake as scheduled.     During the course of the visit the patient was educated and counseled about the following appropriate screening and preventive services:   Vaccines to include Pneumoccal, Influenza, Hepatitis B, Td, Zostavax, HCV  Electrocardiogram  Cardiovascular Disease  Colorectal cancer screening  Bone density screening  Diabetes screening  Glaucoma screening  Mammography/PAP  Nutrition counseling   Patient Instructions (the written plan) was given to the patient.   Rudene Anda, RN  08/20/2015

## 2015-08-25 ENCOUNTER — Encounter: Payer: Self-pay | Admitting: Medical

## 2015-08-25 ENCOUNTER — Ambulatory Visit (INDEPENDENT_AMBULATORY_CARE_PROVIDER_SITE_OTHER): Payer: Medicare Other | Admitting: Medical

## 2015-08-25 VITALS — BP 128/76 | HR 68 | Temp 98.3°F | Resp 16 | Ht 62.0 in | Wt 208.8 lb

## 2015-08-25 DIAGNOSIS — T7840XA Allergy, unspecified, initial encounter: Secondary | ICD-10-CM

## 2015-08-25 MED ORDER — METHYLPREDNISOLONE ACETATE 40 MG/ML IJ SUSP
40.0000 mg | Freq: Once | INTRAMUSCULAR | Status: AC
  Administered 2015-08-25: 40 mg via INTRAMUSCULAR

## 2015-08-25 MED ORDER — PREDNISONE 20 MG PO TABS: ORAL_TABLET | ORAL | Status: DC

## 2015-08-25 MED ORDER — HYDROXYZINE HCL 10 MG PO TABS: 10.0000 mg | ORAL_TABLET | Freq: Three times a day (TID) | ORAL | Status: DC | PRN

## 2015-08-25 NOTE — Patient Instructions (Addendum)
  Allergic rxn to poison ivy. We gave depomedrol 40 mg im. Rx 3 days of prednisone.  Rx low dose hydroxyzine.  Follow up 7 days or as needed  Considered possibility  of shingles but if so past treatment windows and distribution of rash did not match

## 2015-08-25 NOTE — Progress Notes (Signed)
Subjective:    Patient ID: Jackie Stevens, female    DOB: [birth year], 69 y.o.   MRN: [id number]  HPI  Pt in with rash on her left forearm, left side area and left side abdomen. Worse area is left forearm and antecubital area. Rash does itch and does burn but only a little.. This started 5 days ago. Pt got fluvaccine the other day but on opposite  side.   This rash came on 5 days. Pt was working in her yard. She states she usually in past did not get poison ivy but may have had exposure. Some mild rash on rt forearm and some abdomen   Review of Systems  Constitutional: Negative for fever, chills and fatigue.  Respiratory: Negative for cough, chest tightness, shortness of breath and wheezing.   Cardiovascular: Negative for chest pain and palpitations.  Gastrointestinal: Negative for abdominal pain.  Musculoskeletal: Negative for back pain.  Skin: Positive for rash.       Left arm. Some itching and burning.  Hematological: Negative for adenopathy. Does not bruise/bleed easily.  Psychiatric/Behavioral: Negative for behavioral problems and confusion.    Past Medical History  Diagnosis Date  . Allergic rhinitis   . GERD (gastroesophageal reflux disease)   . Hyperlipidemia   . Chicken pox as a child  . Measles as a child  . Insomnia   . Thyroid disease   . Constipation   . Diverticulitis 10/25/2012    pt. reports that a drain was placed - 09/2012    . Abnormal cervical cytology 10/25/2012    Follows with Dr Leavy Cella of Gyn  . Asthma   . Heart murmur   . Complication of anesthesia   . PONV (postoperative nausea and vomiting)   . HTN (hypertension)     stress test completed by Anselm Lis, diagnosed as GERD  . Thyroid cancer 1980's  . Hypothyroidism   . H/O hiatal hernia   . External hemorrhoid, bleeding     "sometimes" (12-22-2012)  . Arthritis     "knees; right thumb; shoulders" (2012/12/22)  . Kidney stones 1970's    "passed on their own" (12/22/2012)  . OSA on CPAP    . UTI (urinary tract infection) 04/02/2013  . Anemia 10/06/2013  . Preventative health care 10/06/2013    [facility] Pneumovax in 2012  . Low back pain 06/09/2014  . Medicare annual wellness visit, subsequent 10/06/2013    [facility] Pneumovax in 2012   . Paresthesia 03/23/2015    Left face    Social History   Social History  . Marital Status: Married    Spouse Name: N/A  . Number of Children: N/A  . Years of Education: N/A   Occupational History  . Not on file.   Social History Main Topics  . Smoking status: Never Smoker   . Smokeless tobacco: Never Used  . Alcohol Use: 0.0 oz/week    0 Standard drinks or equivalent per week     Comment: 2012-12-22 "glass of wine q night q other month or so"  . Drug Use: No  . Sexual Activity: No   Other Topics Concern  . Not on file   Social History Narrative   Married    children    Past Surgical History  Procedure Laterality Date  . Thyroidectomy, partial  1988    "then did iodine to remove the rest" (2012-12-22)  . Sigmoid resection / rectopexy  12/22/12  . Colon surgery    .  Tonsillectomy  1951?  Marland Kitchen Cholecystectomy  1990  . Vaginal hysterectomy  1970's    "still have my ovaries" (12/19/2012)  . Dilation and curettage of uterus  1960's    "lots of them; had miscarriages" (12/19/2012)  . Transrectal drainage of pelvic abscess  10/27/2012  . Colostomy revision  12/19/2012    Procedure: COLON RESECTION SIGMOID;  Surgeon: Gwenyth Ober, MD;  Location: [facility];  Service: General;  Laterality: N/A;  . Cystoscopy with stent placement  12/19/2012    Procedure: CYSTOSCOPY WITH STENT PLACEMENT;  Surgeon: Hanley Ben, MD;  Location: [facility];  Service: Urology;  Laterality: N/A;    Family History  Problem Relation Age of Onset  . Heart disease Father   . Pneumonia Father   . Hypertension Father   . Hyperlipidemia Father   . Cancer Father     skin  . Stroke Father   . Cancer Paternal Aunt   . Alzheimer's  disease Mother   . Heart disease Mother   . Emphysema Brother     marijuana and cigarettes  . Alcohol abuse Brother   . Hearing loss Brother   . Diabetes Maternal Grandmother   . Alzheimer's disease Paternal Grandmother   . Cancer Paternal Grandmother     lung?- smoker  . Hyperlipidemia Paternal Grandmother   . Heart attack Paternal Grandfather   . Alcohol abuse Paternal Grandfather   . Neurofibromatosis Son     schwanomatosis  . Neurofibromatosis Son     swanomatosis    Allergies  Allergen Reactions  . Niacin Other (See Comments) and Cough    "cough til I threw up" (12/19/2012)  . Neomycin-Bacitracin Zn-Polymyx Rash    Polysporin- is tolerated     Current Outpatient Prescriptions on File Prior to Visit  Medication Sig Dispense Refill  . albuterol (PROVENTIL HFA;VENTOLIN HFA) 108 (90 BASE) MCG/ACT inhaler Inhale 2 puffs into the lungs every 6 (six) hours as needed for wheezing. 1 Inhaler 2  . aspirin EC 81 MG tablet Take 81 mg by mouth at bedtime.    . cetirizine (ZYRTEC) 10 MG tablet Take 1 tablet (10 mg total) by mouth daily. 30 tablet 11  . fluticasone (FLONASE) 50 MCG/ACT nasal spray Place 2 sprays into both nostrils daily. (Patient taking differently: Place 2 sprays into both nostrils daily as needed for allergies. ) 16 g 2  . hydrochlorothiazide (HYDRODIURIL) 25 MG tablet Take 1 tablet (25 mg total) by mouth daily. 90 tablet 2  . KRILL OIL PO Take 500 mg by mouth daily.     Marland Kitchen levothyroxine (SYNTHROID, LEVOTHROID) 150 MCG tablet Take 150-225 mcg by mouth daily. 1 tablet (150 mcg) every day except Sunday, 1.5 tablet (225 mcg) on Sunday    . naproxen (NAPROSYN) 375 MG tablet TAKE 1 TABLET (375 MG TOTAL) BY MOUTH 2 (TWO) TIMES DAILY WITH A MEAL. 180 tablet 1  . NEOMYCIN-POLYMYXIN-HYDROCORTISONE (CORTISPORIN) 1 % SOLN otic solution Place 3 drops into both ears 2 (two) times daily as needed. For 5 days. 10 mL 0  . NITROSTAT 0.4 MG SL tablet PLACE 1 TABLET UNDER THE TONGUE EVERY 5  MINUTES AS NEEDED FOR CHEST PAIN 25 tablet 0  . omeprazole (PRILOSEC) 20 MG capsule TAKE 1 CAPSULE EVERY MORNING AT BREAFAST 90 capsule 2  . Polyethyl Glycol-Propyl Glycol 0.4-0.3 % GEL Apply 1 drop to eye at bedtime.    . Probiotic Product (PROBIOTIC ADVANCED PO) Take 2 tablets by mouth daily. Digestive Advantage    .  psyllium (REGULOID) 0.52 G capsule Take 0.52 g by mouth at bedtime.    . ranitidine (ZANTAC) 150 MG tablet TAKE 1 TABLET BY MOUTH AT BEDTIME 90 tablet 0  . senna (SENOKOT) 8.6 MG TABS tablet Take 1 tablet by mouth daily as needed for mild constipation.    . simvastatin (ZOCOR) 40 MG tablet TAKE 1 TABLET (40 MG TOTAL) BY MOUTH AT BEDTIME. 90 tablet 2  . sodium chloride (OCEAN) 0.65 % SOLN nasal spray Place 1 spray into both nostrils at bedtime.    . traMADol (ULTRAM) 50 MG tablet Take 1 tablet (50 mg total) by mouth every 6 (six) hours as needed. 90 tablet 0  . TURMERIC PO Take 1 tablet by mouth daily.     . valsartan (DIOVAN) 320 MG tablet Take 1 tablet (320 mg total) by mouth daily. 90 tablet 2  . vitamin B-12 (CYANOCOBALAMIN) 1000 MCG tablet Take 2,500 mcg by mouth daily.     No current facility-administered medications on file prior to visit.    BP 128/76 mmHg  Pulse 68  Temp(Src) 98.3 F (36.8 C) (Oral)  Resp 16  Ht 5\' 2"  (1.575 m)  Wt 208 lb 12.8 oz (94.711 kg)  BMI 38.18 kg/m2  SpO2 99%       Objective:   Physical Exam   General- No acute distress. Pleasant patient. Neck- Full range of motion, no jvd Lungs- Clear, even and unlabored. Heart- regular rate and rhythm. Neurologic- CNII- XII grossly intact. Skin- scattered papular and vesicular eruption left distal forearm. Some antecubital fossa. Rt wrist- 1 small papule.Lt side abdomen- 3 small papules.     Assessment & Plan:  Allergic rxn to poison ivy. We gave depomedrol 40 mg im. Rx 3 days of prednisone.  Rx low dose hydroxyzine.  Follow up 7 days or as needed  Considered possibility  of  shingles but if so past treatment windows and distribution of rash did not match

## 2015-08-25 NOTE — Progress Notes (Signed)
Pre visit review using our clinic review tool, if applicable. No additional management support is needed unless otherwise documented below in the visit note. 

## 2015-09-01 ENCOUNTER — Encounter: Payer: Self-pay | Admitting: Medical

## 2015-09-01 ENCOUNTER — Encounter: Payer: Self-pay | Admitting: Family Medicine

## 2015-09-01 ENCOUNTER — Ambulatory Visit (INDEPENDENT_AMBULATORY_CARE_PROVIDER_SITE_OTHER): Payer: Medicare Other | Admitting: Medical

## 2015-09-01 VITALS — BP 147/63 | HR 72 | Temp 98.5°F | Resp 16 | Ht 62.0 in | Wt 209.0 lb

## 2015-09-01 DIAGNOSIS — T7840XD Allergy, unspecified, subsequent encounter: Secondary | ICD-10-CM

## 2015-09-01 DIAGNOSIS — L089 Local infection of the skin and subcutaneous tissue, unspecified: Secondary | ICD-10-CM | POA: Diagnosis not present

## 2015-09-01 MED ORDER — DOXYCYCLINE HYCLATE 100 MG PO TABS: 100.0000 mg | ORAL_TABLET | Freq: Two times a day (BID) | ORAL | Status: DC

## 2015-09-01 MED ORDER — PREDNISONE 20 MG PO TABS: ORAL_TABLET | ORAL | Status: DC

## 2015-09-01 NOTE — Patient Instructions (Addendum)
   Allergic reaction that is persisting with new secondary infection.   Will rx 5 days of prednisone. Also give doxycycline antibiotic.   Give me update on Wednesday on how you are doing.   If area spreads toware upper arm then ED evaluation.  If rash is persisiting the same/not worse then can try to get you in early next week with dermatologist.

## 2015-09-01 NOTE — Progress Notes (Signed)
Subjective:    Patient ID: Jackie Stevens, female    DOB: February 28, 1946, 69 y.o.   MRN: [id number]  HPI Pt in with rash on her left forearm, left side area and left side abdomen. Worse area is left forearm and antecubital area. Rash does itch and does burn but only a little.. This started 5 days ago. Pt got fluvaccine the other day but on opposite side.   This rash came on 5 days. Pt was working in her yard. She states she usually in past did not get poison ivy but may have had exposure. Some mild rash on rt forearm and some abdomen  Above is from last note.  Pt will get intermittent episodes of reddening of forearm area in region of  pior outbreak. Since last visit. No fever, no chills or sweats.  I treated for possible  Allergic rxn to poison ivy. We gave depomedrol 40 mg im. Rx 3 days of prednisone. Rx low dose hydroxyzine.    Considered possibility of shingles but if so past treatment windows and distribution of rash did not match     Review of Systems  Constitutional: Negative for fever, chills and fatigue.  Respiratory: Negative for cough, chest tightness, shortness of breath and wheezing.   Cardiovascular: Negative for chest pain and palpitations.  Gastrointestinal: Negative for abdominal pain.  Musculoskeletal: Negative for back pain.  Skin: Positive for rash.       Left arm. Some itching and burning.  Hematological: Negative for adenopathy. Does not bruise/bleed easily.  Psychiatric/Behavioral: Negative for behavioral problems and confusion.    Past Medical History  Diagnosis Date  . Allergic rhinitis   . GERD (gastroesophageal reflux disease)   . Hyperlipidemia   . Chicken pox as a child  . Measles as a child  . Insomnia   . Thyroid disease   . Constipation   . Diverticulitis 10/25/2012    pt. reports that a drain was placed - 09/2012    . Abnormal cervical cytology 10/25/2012    Follows with Dr Leavy Cella of Gyn  . Asthma   . Heart murmur   .  Complication of anesthesia   . PONV (postoperative nausea and vomiting)   . HTN (hypertension)     stress test completed by Anselm Lis, diagnosed as GERD  . Thyroid cancer 1980's  . Hypothyroidism   . H/O hiatal hernia   . External hemorrhoid, bleeding     "sometimes" (January 09, 2013)  . Arthritis     "knees; right thumb; shoulders" (01/09/13)  . Kidney stones 1970's    "passed on their own" (09-Jan-2013)  . OSA on CPAP   . UTI (urinary tract infection) 04/02/2013  . Anemia 10/06/2013  . Preventative health care 10/06/2013    [facility] Pneumovax in 2012  . Low back pain 06/09/2014  . Medicare annual wellness visit, subsequent 10/06/2013    [facility] Dermat[facility] Pneumovax in 2012   . Paresthesia 03/23/2015    Left face    Social History   Social History  . Marital Status: Married    Spouse Name: N/A  . Number of Children: N/A  . Years of Education: N/A   Occupational History  . Not on file.   Social History Main Topics  . Smoking status: Never Smoker   . Smokeless tobacco: Never Used  . Alcohol Use: 0.0 oz/week    0 Standard drinks or equivalent per week     Comment: January 09, 2013 "glass of wine q night  q other month or so"  . Drug Use: No  . Sexual Activity: No   Other Topics Concern  . Not on file   Social History Narrative   Married    children    Past Surgical History  Procedure Laterality Date  . Thyroidectomy, partial  1988    "then did iodine to remove the rest" (12/19/2012)  . Sigmoid resection / rectopexy  12/19/2012  . Colon surgery    . Tonsillectomy  1951?  Marland Kitchen Cholecystectomy  1990  . Vaginal hysterectomy  1970's    "still have my ovaries" (12/19/2012)  . Dilation and curettage of uterus  1960's    "lots of them; had miscarriages" (12/19/2012)  . Transrectal drainage of pelvic abscess  10/27/2012  . Colostomy revision  12/19/2012    Procedure: COLON RESECTION SIGMOID;  Surgeon: Gwenyth Ober, MD;  Location: [place];  Service: General;   Laterality: N/A;  . Cystoscopy with stent placement  12/19/2012    Procedure: CYSTOSCOPY WITH STENT PLACEMENT;  Surgeon: Hanley Ben, MD;  Location: [place];  Service: Urology;  Laterality: N/A;    Family History  Problem Relation Age of Onset  . Heart disease Father   . Pneumonia Father   . Hypertension Father   . Hyperlipidemia Father   . Cancer Father     skin  . Stroke Father   . Cancer Paternal Aunt   . Alzheimer's disease Mother   . Heart disease Mother   . Emphysema Brother     marijuana and cigarettes  . Alcohol abuse Brother   . Hearing loss Brother   . Diabetes Maternal Grandmother   . Alzheimer's disease Paternal Grandmother   . Cancer Paternal Grandmother     lung?- smoker  . Hyperlipidemia Paternal Grandmother   . Heart attack Paternal Grandfather   . Alcohol abuse Paternal Grandfather   . Neurofibromatosis Son     schwanomatosis  . Neurofibromatosis Son     swanomatosis    Allergies  Allergen Reactions  . Niacin Other (See Comments) and Cough    "cough til I threw up" (12/19/2012)  . Neomycin-Bacitracin Zn-Polymyx Rash    Polysporin- is tolerated     Current Outpatient Prescriptions on File Prior to Visit  Medication Sig Dispense Refill  . albuterol (PROVENTIL HFA;VENTOLIN HFA) 108 (90 BASE) MCG/ACT inhaler Inhale 2 puffs into the lungs every 6 (six) hours as needed for wheezing. 1 Inhaler 2  . aspirin EC 81 MG tablet Take 81 mg by mouth at bedtime.    . cetirizine (ZYRTEC) 10 MG tablet Take 1 tablet (10 mg total) by mouth daily. 30 tablet 11  . fluticasone (FLONASE) 50 MCG/ACT nasal spray Place 2 sprays into both nostrils daily. (Patient taking differently: Place 2 sprays into both nostrils daily as needed for allergies. ) 16 g 2  . hydrochlorothiazide (HYDRODIURIL) 25 MG tablet Take 1 tablet (25 mg total) by mouth daily. 90 tablet 2  . hydrOXYzine (ATARAX/VISTARIL) 10 MG tablet Take 1 tablet (10 mg total) by mouth every 8 (eight) hours as needed. 15  tablet 0  . KRILL OIL PO Take 500 mg by mouth daily.     Marland Kitchen levothyroxine (SYNTHROID, LEVOTHROID) 150 MCG tablet Take 150-225 mcg by mouth daily. 1 tablet (150 mcg) every day except Sunday, 1.5 tablet (225 mcg) on Sunday    . naproxen (NAPROSYN) 375 MG tablet TAKE 1 TABLET (375 MG TOTAL) BY MOUTH 2 (TWO) TIMES DAILY WITH A MEAL. 180 tablet 1  .  NEOMYCIN-POLYMYXIN-HYDROCORTISONE (CORTISPORIN) 1 % SOLN otic solution Place 3 drops into both ears 2 (two) times daily as needed. For 5 days. 10 mL 0  . NITROSTAT 0.4 MG SL tablet PLACE 1 TABLET UNDER THE TONGUE EVERY 5 MINUTES AS NEEDED FOR CHEST PAIN 25 tablet 0  . omeprazole (PRILOSEC) 20 MG capsule TAKE 1 CAPSULE EVERY MORNING AT BREAFAST 90 capsule 2  . Polyethyl Glycol-Propyl Glycol 0.4-0.3 % GEL Apply 1 drop to eye at bedtime.    . predniSONE (DELTASONE) 20 MG tablet 1 tab po tid day 1, then 1 tab po bid day 2, then 1 tab po on day 3. 6 tablet 0  . Probiotic Product (PROBIOTIC ADVANCED PO) Take 2 tablets by mouth daily. Digestive Advantage    . psyllium (REGULOID) 0.52 G capsule Take 0.52 g by mouth at bedtime.    . ranitidine (ZANTAC) 150 MG tablet TAKE 1 TABLET BY MOUTH AT BEDTIME 90 tablet 0  . senna (SENOKOT) 8.6 MG TABS tablet Take 1 tablet by mouth daily as needed for mild constipation.    . simvastatin (ZOCOR) 40 MG tablet TAKE 1 TABLET (40 MG TOTAL) BY MOUTH AT BEDTIME. 90 tablet 2  . sodium chloride (OCEAN) 0.65 % SOLN nasal spray Place 1 spray into both nostrils at bedtime.    . traMADol (ULTRAM) 50 MG tablet Take 1 tablet (50 mg total) by mouth every 6 (six) hours as needed. 90 tablet 0  . TURMERIC PO Take 1 tablet by mouth daily.     . valsartan (DIOVAN) 320 MG tablet Take 1 tablet (320 mg total) by mouth daily. 90 tablet 2  . vitamin B-12 (CYANOCOBALAMIN) 1000 MCG tablet Take 2,500 mcg by mouth daily.     No current facility-administered medications on file prior to visit.    BP 147/63 mmHg  Pulse 72  Temp(Src) 98.5 F (36.9 C)  (Oral)  Resp 16  Ht 5\' 2"  (1.575 m)  Wt 209 lb (94.802 kg)  BMI 38.22 kg/m2  SpO2 98%       Objective:   Physical Exam  General- No acute distress. Pleasant patient. Neck- Full range of motion, no jvd Lungs- Clear, even and unlabored. Heart- regular rate and rhythm. Neurologic- CNII- XII grossly intact. Skin- scattered papular and vesicular eruption left distal forearm but also diffuse area of redness that feels warm over ventral forearm. Some antecubital fossa. Lt forearm appears more swollen than rt side but not tender to touch.  Rt wrist- 1 small papule.Lt side abdomen- 3 small papules.     Assessment & Plan:   Allergic reaction that is persisting with new secondary infection.   Will rx 5 days of prednisone. Also give doxycycline antibiotic.   Give me update on Wednesday on how you are doing.   If area spreads toware upper arm then ED evaluation.  If rash is persisiting the same/not worse then can try to get you in early next week with dermatologist

## 2015-09-01 NOTE — Progress Notes (Signed)
Pre visit review using our clinic review tool, if applicable. No additional management support is needed unless otherwise documented below in the visit note. 

## 2015-09-03 ENCOUNTER — Telehealth: Payer: Self-pay | Admitting: Family Medicine

## 2015-09-03 NOTE — Telephone Encounter (Signed)
Relation to pt: self Call back number: [phone]   Reason for call:  Patient wanted to inform PA she has a dermatology appointment today regarding the concerns patient was seen for on 09/01/15

## 2015-09-03 NOTE — Telephone Encounter (Signed)
I will let ES know about this.

## 2015-09-05 ENCOUNTER — Ambulatory Visit: Payer: Medicare Other | Attending: Gynecologic Oncology | Admitting: Gynecologic Oncology

## 2015-09-05 ENCOUNTER — Encounter: Payer: Self-pay | Admitting: Gynecologic Oncology

## 2015-09-05 ENCOUNTER — Encounter: Payer: Medicare Other | Admitting: Family Medicine

## 2015-09-05 VITALS — BP 175/70 | HR 77 | Temp 98.1°F | Resp 20 | Ht 62.0 in | Wt 209.6 lb

## 2015-09-05 DIAGNOSIS — N949 Unspecified condition associated with female genital organs and menstrual cycle: Secondary | ICD-10-CM

## 2015-09-05 DIAGNOSIS — N9489 Other specified conditions associated with female genital organs and menstrual cycle: Secondary | ICD-10-CM

## 2015-09-05 DIAGNOSIS — K439 Ventral hernia without obstruction or gangrene: Secondary | ICD-10-CM | POA: Diagnosis not present

## 2015-09-05 NOTE — Progress Notes (Signed)
Consult Note: Gyn-Onc  Consult was requested by Dr. Julien Girt for the evaluation of Jackie Stevens 69 y.o. female with an 8cm ovarian cyst.  CC:  Chief Complaint  Patient presents with  . right adnexal mass    New consult    Assessment/Plan:  Jackie Stevens  is a 69 y.o.  year old with an asymptomatic 8cm ovarian cyst. Based on its appearance on imaging and her normal CA 125 (3) I believe this mass is most likely benign. Given her complex abdominal wall hernia, surgical resection would require a simultaneous hernia repair.  We will have her seen by [facility] for evaluation of her hernia. If she is a good candidate for a hernia repair, and if it is felt that this is a necessary procedure, then we would perform a BSO at the same surgery. However, if she is not felt to be a good candidate for repair of the hernia, or if the anticipated morbidity associated with the hernia repair is signficant enough that the patient elects to not proceed, then she does not require surgical intervention of the ovarian cyst.  We will facilitate the [facility] referral and followup with them regarding their recommendations for hernia repair.   HPI: Jackie Stevens is a 69 year old woman who is seen in consultation at the request of Dr Julien Girt for an 8cm right complex ovarian cyst. The patient's cyst was identified on a routine annual pelvic exam when fullness was appreciated in the pelvis. A pelvic US was ordered which showed a 7.7cm complex cyst in the right adnexa with no abnromal blood flow. A followup CT scan was performed on 08/14/15 which confirmed the 7-8cm right ovarian mass, but also showed a complex ventral wall hernia with multiple loops of small intestine and transverse colon involved.  CA 125 was drawn on 08/08/15 and was normal at 3.  The patient is obese (BMI 38kg/m2). SHe has a history of a prior hysterectomy for benign disease. She has a history of diverticultis with abscess formation and  received an ex lap and sigmoid colectomy with reanastamosis with Dr Hulen Skains at [facility] in 2014.   Current Meds:  Outpatient Encounter Prescriptions as of 09/05/2015  Medication Sig  . aspirin EC 81 MG tablet Take 81 mg by mouth at bedtime.  . clobetasol cream (TEMOVATE) 0.05 %   . doxycycline (VIBRA-TABS) 100 MG tablet Take 1 tablet (100 mg total) by mouth 2 (two) times daily.  . hydrochlorothiazide (HYDRODIURIL) 25 MG tablet Take 1 tablet (25 mg total) by mouth daily.  . hydrOXYzine (ATARAX/VISTARIL) 10 MG tablet Take 1 tablet (10 mg total) by mouth every 8 (eight) hours as needed.  Marland Kitchen KRILL OIL PO Take 500 mg by mouth daily.   Marland Kitchen levothyroxine (SYNTHROID, LEVOTHROID) 150 MCG tablet Take 150-225 mcg by mouth daily. 1 tablet (150 mcg) every day except Sunday, 1.5 tablet (225 mcg) on Sunday  . naproxen (NAPROSYN) 375 MG tablet TAKE 1 TABLET (375 MG TOTAL) BY MOUTH 2 (TWO) TIMES DAILY WITH A MEAL.  Marland Kitchen omeprazole (PRILOSEC) 20 MG capsule TAKE 1 CAPSULE EVERY MORNING AT BREAFAST  . Polyethyl Glycol-Propyl Glycol 0.4-0.3 % GEL Apply 1 drop to eye at bedtime.  . predniSONE (DELTASONE) 20 MG tablet 1 tab po bid x 5 days  . Probiotic Product (PROBIOTIC ADVANCED PO) Take 2 tablets by mouth daily. Digestive Advantage  . psyllium (REGULOID) 0.52 G capsule Take 0.52 g by mouth at bedtime.  . ranitidine (ZANTAC) 150  MG tablet TAKE 1 TABLET BY MOUTH AT BEDTIME  . senna (SENOKOT) 8.6 MG TABS tablet Take 1 tablet by mouth daily as needed for mild constipation.  . simvastatin (ZOCOR) 40 MG tablet TAKE 1 TABLET (40 MG TOTAL) BY MOUTH AT BEDTIME.  . sodium chloride (OCEAN) 0.65 % SOLN nasal spray Place 1 spray into both nostrils at bedtime.  . traMADol (ULTRAM) 50 MG tablet Take 1 tablet (50 mg total) by mouth every 6 (six) hours as needed.  . TURMERIC PO Take 1 tablet by mouth daily.   . valsartan (DIOVAN) 320 MG tablet Take 1 tablet (320 mg total) by mouth daily.  . vitamin B-12 (CYANOCOBALAMIN) 1000 MCG tablet  Take 2,500 mcg by mouth daily.  . Vitamin D, Cholecalciferol, 1000 UNITS CAPS Take 1 capsule by mouth daily.  Marland Kitchen albuterol (PROVENTIL HFA;VENTOLIN HFA) 108 (90 BASE) MCG/ACT inhaler Inhale 2 puffs into the lungs every 6 (six) hours as needed for wheezing. (Patient not taking: Reported on 09/05/2015)  . cetirizine (ZYRTEC) 10 MG tablet Take 1 tablet (10 mg total) by mouth daily. (Patient not taking: Reported on 09/05/2015)  . fluticasone (FLONASE) 50 MCG/ACT nasal spray Place 2 sprays into both nostrils daily. (Patient not taking: Reported on 09/05/2015)  . NEOMYCIN-POLYMYXIN-HYDROCORTISONE (CORTISPORIN) 1 % SOLN otic solution Place 3 drops into both ears 2 (two) times daily as needed. For 5 days. (Patient not taking: Reported on 09/05/2015)  . NITROSTAT 0.4 MG SL tablet PLACE 1 TABLET UNDER THE TONGUE EVERY 5 MINUTES AS NEEDED FOR CHEST PAIN (Patient not taking: Reported on 09/05/2015)   No facility-administered encounter medications on file as of 09/05/2015.    Allergy:  Allergies  Allergen Reactions  . Niacin Other (See Comments) and Cough    "cough til I threw up" (12/19/2012)  . Neomycin-Bacitracin Zn-Polymyx Rash    Polysporin- is tolerated     Social Hx:   Social History   Social History  . Marital Status: Married    Spouse Name: N/A  . Number of Children: N/A  . Years of Education: N/A   Occupational History  . Not on file.   Social History Main Topics  . Smoking status: Never Smoker   . Smokeless tobacco: Never Used  . Alcohol Use: 0.0 oz/week    0 Standard drinks or equivalent per week     Comment: 12/19/2012 "glass of wine q night q other month or so"  . Drug Use: No  . Sexual Activity: No   Other Topics Concern  . Not on file   Social History Narrative   Married    children    Past Surgical Hx:  Past Surgical History  Procedure Laterality Date  . Thyroidectomy, partial  1988    "then did iodine to remove the rest" (12/19/2012)  . Sigmoid resection / rectopexy   12/19/2012  . Colon surgery    . Tonsillectomy  1951?  Marland Kitchen Cholecystectomy  1990  . Vaginal hysterectomy  1970's    "still have my ovaries" (12/19/2012)  . Dilation and curettage of uterus  1960's    "lots of them; had miscarriages" (12/19/2012)  . Transrectal drainage of pelvic abscess  10/27/2012  . Colostomy revision  12/19/2012    Procedure: COLON RESECTION SIGMOID;  Surgeon: Gwenyth Ober, MD;  Location: [place];  Service: General;  Laterality: N/A;  . Cystoscopy with stent placement  12/19/2012    Procedure: CYSTOSCOPY WITH STENT PLACEMENT;  Surgeon: Hanley Ben, MD;  Location: [place];  Service: Urology;  Laterality: N/A;    Past Medical Hx:  Past Medical History  Diagnosis Date  . Allergic rhinitis   . GERD (gastroesophageal reflux disease)   . Hyperlipidemia   . Chicken pox as a child  . Measles as a child  . Insomnia   . Thyroid disease   . Constipation   . Diverticulitis 10/25/2012    pt. reports that a drain was placed - 09/2012    . Abnormal cervical cytology 10/25/2012    Follows with Dr Leavy Cella of Gyn  . Asthma   . Heart murmur   . Complication of anesthesia   . PONV (postoperative nausea and vomiting)   . HTN (hypertension)     stress test completed by Anselm Lis, diagnosed as GERD  . Thyroid cancer 1980's  . Hypothyroidism   . H/O hiatal hernia   . External hemorrhoid, bleeding     "sometimes" (12/31/12)  . Arthritis     "knees; right thumb; shoulders" (December 31, 2012)  . Kidney stones 1970's    "passed on their own" (12-31-12)  . OSA on CPAP   . UTI (urinary tract infection) 04/02/2013  . Anemia 10/06/2013  . Preventative health care 10/06/2013    [facility] Pneumovax in 2012  . Low back pain 06/09/2014  . Medicare annual wellness visit, subsequent 10/06/2013    [facility] Pneumovax in 2012   . Paresthesia 03/23/2015    Left face    Past Gynecological History:  SVD x 2, prior hyst for benign disease  No LMP recorded.  Patient has had a hysterectomy.  Family Hx:  Family History  Problem Relation Age of Onset  . Heart disease Father   . Pneumonia Father   . Hypertension Father   . Hyperlipidemia Father   . Cancer Father     skin  . Stroke Father   . Cancer Paternal Aunt   . Alzheimer's disease Mother   . Heart disease Mother   . Emphysema Brother     marijuana and cigarettes  . Alcohol abuse Brother   . Hearing loss Brother   . Diabetes Maternal Grandmother   . Alzheimer's disease Paternal Grandmother   . Cancer Paternal Grandmother     lung?- smoker  . Hyperlipidemia Paternal Grandmother   . Heart attack Paternal Grandfather   . Alcohol abuse Paternal Grandfather   . Neurofibromatosis Son     schwanomatosis  . Neurofibromatosis Son     swanomatosis    Review of Systems:  Constitutional  Feels well,    ENT Normal appearing ears and nares bilaterally Skin/Breast  No rash, sores, jaundice, itching, dryness Cardiovascular  No chest pain, shortness of breath, or edema  Pulmonary  No cough or wheeze.  Gastro Intestinal  No nausea, vomitting, or diarrhoea. No bright red blood per rectum, no abdominal pain, change in bowel movement, or constipation.  Genito Urinary  No frequency, urgency, dysuria, see HPI Musculo Skeletal  No myalgia, arthralgia, joint swelling or pain  Neurologic  No weakness, numbness, change in gait,  Psychology  No depression, anxiety, insomnia.   Vitals:  Blood pressure 175/70, pulse 77, temperature 98.1 F (36.7 C), temperature source Oral, resp. rate 20, height 5\' 2"  (1.575 m), weight 209 lb 9.6 oz (95.074 kg), SpO2 99 %.  Physical Exam: WD in NAD Neck  Supple NROM, without any enlargements.  Lymph Node Survey No cervical supraclavicular or inguinal adenopathy Cardiovascular  Pulse normal rate, regularity and rhythm. S1 and S2  normal.  Lungs  Clear to auscultation bilateraly, without wheezes/crackles/rhonchi. Good air movement.  Skin  No  rash/lesions/breakdown  Psychiatry  Alert and oriented to person, place, and time  Abdomen  Normoactive bowel sounds, abdomen soft, non-tender and obese with large left mid abdominal ventral hernia.  Back No CVA tenderness Genito Urinary  Vulva/vagina: Normal external female genitalia.  No lesions. No discharge or bleeding.  Bladder/urethra:  No lesions or masses, well supported bladder  Vagina: normal  Cervix: surgically absent  Uterus: surgically absent   Adnexa: smooth, cystic mobile right adnexal mass. Rectal  Good tone, no masses no cul de sac nodularity.  Extremities  No bilateral cyanosis, clubbing or edema.   Donaciano Eva, MD  09/05/2015, 5:15 PM

## 2015-09-05 NOTE — Patient Instructions (Signed)
You will receive a phone call from [facility] about meeting with Dr. Hassell Done to discuss hernia repair.  They had to send Dr. Hassell Done a message since you had seen Dr. Hulen Skains in the past.  If surgery is decided, we can plan to remove the ovarian cyst at the same time.  Please call for any questions or concerns.

## 2015-09-08 ENCOUNTER — Telehealth: Payer: Self-pay | Admitting: *Deleted

## 2015-09-08 NOTE — Telephone Encounter (Signed)
Unable to reach patient at time of Pre-Visit Call.  Left message to confirm appointment.  

## 2015-09-09 ENCOUNTER — Encounter: Payer: Self-pay | Admitting: Family Medicine

## 2015-09-09 ENCOUNTER — Telehealth: Payer: Self-pay

## 2015-09-09 ENCOUNTER — Ambulatory Visit (INDEPENDENT_AMBULATORY_CARE_PROVIDER_SITE_OTHER): Payer: Medicare Other | Admitting: Family Medicine

## 2015-09-09 VITALS — BP 132/60 | HR 104 | Temp 97.9°F | Ht 62.0 in | Wt 209.4 lb

## 2015-09-09 DIAGNOSIS — E039 Hypothyroidism, unspecified: Secondary | ICD-10-CM

## 2015-09-09 DIAGNOSIS — D649 Anemia, unspecified: Secondary | ICD-10-CM

## 2015-09-09 DIAGNOSIS — G4733 Obstructive sleep apnea (adult) (pediatric): Secondary | ICD-10-CM

## 2015-09-09 DIAGNOSIS — E782 Mixed hyperlipidemia: Secondary | ICD-10-CM

## 2015-09-09 DIAGNOSIS — E559 Vitamin D deficiency, unspecified: Secondary | ICD-10-CM

## 2015-09-09 DIAGNOSIS — I10 Essential (primary) hypertension: Secondary | ICD-10-CM | POA: Diagnosis not present

## 2015-09-09 DIAGNOSIS — E079 Disorder of thyroid, unspecified: Secondary | ICD-10-CM

## 2015-09-09 DIAGNOSIS — K219 Gastro-esophageal reflux disease without esophagitis: Secondary | ICD-10-CM

## 2015-09-09 MED ORDER — VALACYCLOVIR HCL 1 G PO TABS: 1000.0000 mg | ORAL_TABLET | Freq: Three times a day (TID) | ORAL | Status: DC

## 2015-09-09 NOTE — Progress Notes (Signed)
Pre visit review using our clinic review tool, if applicable. No additional management support is needed unless otherwise documented below in the visit note. 

## 2015-09-09 NOTE — Telephone Encounter (Signed)
Contacted [facility] to schedule consultation with Dr Hassell Done for hernia repair per NP orders. Spoke with staff member at [facility]  and the patient will be contacted with the consultation visit date and time to see Dr Hassell Done September 26, 2015 at 3:30 PM.

## 2015-09-09 NOTE — Patient Instructions (Signed)

## 2015-09-10 LAB — COMPREHENSIVE METABOLIC PANEL
ALT: 16 U/L (ref 0–35)
AST: 14 U/L (ref 0–37)
Albumin: 3.7 g/dL (ref 3.5–5.2)
Alkaline Phosphatase: 88 U/L (ref 39–117)
BUN: 21 mg/dL (ref 6–23)
CO2: 33 mEq/L — ABNORMAL HIGH (ref 19–32)
Calcium: 8.9 mg/dL (ref 8.4–10.5)
Chloride: 97 mEq/L (ref 96–112)
Creatinine, Ser: 0.77 mg/dL (ref 0.40–1.20)
GFR: 78.52 mL/min (ref >60.00)
Glucose, Bld: 89 mg/dL (ref 70–99)
Potassium: 4.3 mEq/L (ref 3.5–5.1)
Sodium: 135 mEq/L (ref 135–145)
Total Bilirubin: 0.4 mg/dL (ref 0.2–1.2)
Total Protein: 6.3 g/dL (ref 6.0–8.3)

## 2015-09-10 LAB — TSH: TSH: 0.14 u[IU]/mL — ABNORMAL LOW (ref 0.35–4.50)

## 2015-09-14 ENCOUNTER — Emergency Department (HOSPITAL_COMMUNITY): Payer: Medicare Other

## 2015-09-14 ENCOUNTER — Encounter (HOSPITAL_COMMUNITY): Payer: Self-pay | Admitting: Emergency Medicine

## 2015-09-14 ENCOUNTER — Inpatient Hospital Stay (HOSPITAL_COMMUNITY)
Admission: EM | Admit: 2015-09-14 | Discharge: 2015-09-17 | DRG: 392 | Disposition: A | Discharge status: Home / Self Care | Payer: Medicare Other | Attending: Internal Medicine | Admitting: Internal Medicine

## 2015-09-14 DIAGNOSIS — Z79899 Other long term (current) drug therapy: Secondary | ICD-10-CM | POA: Diagnosis not present

## 2015-09-14 DIAGNOSIS — R1032 Left lower quadrant pain: Secondary | ICD-10-CM | POA: Diagnosis present

## 2015-09-14 DIAGNOSIS — Z9049 Acquired absence of other specified parts of digestive tract: Secondary | ICD-10-CM | POA: Diagnosis not present

## 2015-09-14 DIAGNOSIS — Z825 Family history of asthma and other chronic lower respiratory diseases: Secondary | ICD-10-CM

## 2015-09-14 DIAGNOSIS — N83209 Unspecified ovarian cyst, unspecified side: Secondary | ICD-10-CM | POA: Diagnosis present

## 2015-09-14 DIAGNOSIS — K432 Incisional hernia without obstruction or gangrene: Secondary | ICD-10-CM | POA: Diagnosis present

## 2015-09-14 DIAGNOSIS — E782 Mixed hyperlipidemia: Secondary | ICD-10-CM | POA: Diagnosis present

## 2015-09-14 DIAGNOSIS — K5900 Constipation, unspecified: Secondary | ICD-10-CM | POA: Diagnosis present

## 2015-09-14 DIAGNOSIS — Z7982 Long term (current) use of aspirin: Secondary | ICD-10-CM

## 2015-09-14 DIAGNOSIS — E039 Hypothyroidism, unspecified: Secondary | ICD-10-CM | POA: Diagnosis present

## 2015-09-14 DIAGNOSIS — E079 Disorder of thyroid, unspecified: Secondary | ICD-10-CM

## 2015-09-14 DIAGNOSIS — Z791 Long term (current) use of non-steroidal anti-inflammatories (NSAID): Secondary | ICD-10-CM

## 2015-09-14 DIAGNOSIS — Z820 Family history of epilepsy and other diseases of the nervous system: Secondary | ICD-10-CM | POA: Diagnosis not present

## 2015-09-14 DIAGNOSIS — R110 Nausea: Secondary | ICD-10-CM

## 2015-09-14 DIAGNOSIS — Z833 Family history of diabetes mellitus: Secondary | ICD-10-CM | POA: Diagnosis not present

## 2015-09-14 DIAGNOSIS — K529 Noninfective gastroenteritis and colitis, unspecified: Principal | ICD-10-CM | POA: Diagnosis present

## 2015-09-14 DIAGNOSIS — Z8249 Family history of ischemic heart disease and other diseases of the circulatory system: Secondary | ICD-10-CM

## 2015-09-14 DIAGNOSIS — Z823 Family history of stroke: Secondary | ICD-10-CM | POA: Diagnosis not present

## 2015-09-14 DIAGNOSIS — D72829 Elevated white blood cell count, unspecified: Secondary | ICD-10-CM | POA: Diagnosis present

## 2015-09-14 DIAGNOSIS — K649 Unspecified hemorrhoids: Secondary | ICD-10-CM | POA: Diagnosis present

## 2015-09-14 DIAGNOSIS — A419 Sepsis, unspecified organism: Secondary | ICD-10-CM | POA: Diagnosis not present

## 2015-09-14 DIAGNOSIS — D649 Anemia, unspecified: Secondary | ICD-10-CM | POA: Diagnosis not present

## 2015-09-14 DIAGNOSIS — R000 Tachycardia, unspecified: Secondary | ICD-10-CM | POA: Diagnosis present

## 2015-09-14 DIAGNOSIS — K625 Hemorrhage of anus and rectum: Secondary | ICD-10-CM

## 2015-09-14 DIAGNOSIS — K921 Melena: Secondary | ICD-10-CM | POA: Diagnosis not present

## 2015-09-14 DIAGNOSIS — G4733 Obstructive sleep apnea (adult) (pediatric): Secondary | ICD-10-CM | POA: Diagnosis present

## 2015-09-14 DIAGNOSIS — J4520 Mild intermittent asthma, uncomplicated: Secondary | ICD-10-CM | POA: Diagnosis not present

## 2015-09-14 DIAGNOSIS — I10 Essential (primary) hypertension: Secondary | ICD-10-CM | POA: Diagnosis present

## 2015-09-14 DIAGNOSIS — R51 Headache: Secondary | ICD-10-CM | POA: Diagnosis present

## 2015-09-14 DIAGNOSIS — K219 Gastro-esophageal reflux disease without esophagitis: Secondary | ICD-10-CM | POA: Diagnosis not present

## 2015-09-14 DIAGNOSIS — Z87442 Personal history of urinary calculi: Secondary | ICD-10-CM | POA: Diagnosis not present

## 2015-09-14 DIAGNOSIS — Z6837 Body mass index (BMI) 37.0-37.9, adult: Secondary | ICD-10-CM

## 2015-09-14 DIAGNOSIS — Z85850 Personal history of malignant neoplasm of thyroid: Secondary | ICD-10-CM

## 2015-09-14 DIAGNOSIS — M158 Other polyosteoarthritis: Secondary | ICD-10-CM | POA: Diagnosis present

## 2015-09-14 LAB — COMPREHENSIVE METABOLIC PANEL
ALT: 22 U/L (ref 14–54)
AST: 20 U/L (ref 15–41)
Albumin: 3.2 g/dL — ABNORMAL LOW (ref 3.5–5.0)
Alkaline Phosphatase: 82 U/L (ref 38–126)
Anion gap: 8 (ref 5–15)
BUN: 14 mg/dL (ref 6–20)
CO2: 27 mmol/L (ref 22–32)
Calcium: 8.5 mg/dL — ABNORMAL LOW (ref 8.9–10.3)
Chloride: 100 mmol/L — ABNORMAL LOW (ref 101–111)
Creatinine, Ser: 0.73 mg/dL (ref 0.44–1.00)
GFR calc Af Amer: >60 mL/min (ref >60)
GFR calc non Af Amer: >60 mL/min (ref >60)
Glucose, Bld: 122 mg/dL — ABNORMAL HIGH (ref 65–99)
Potassium: 3.8 mmol/L (ref 3.5–5.1)
Sodium: 135 mmol/L (ref 135–145)
Total Bilirubin: 0.8 mg/dL (ref 0.3–1.2)
Total Protein: 6 g/dL — ABNORMAL LOW (ref 6.5–8.1)

## 2015-09-14 LAB — URINE MICROSCOPIC-ADD ON

## 2015-09-14 LAB — CBC WITH DIFFERENTIAL/PLATELET
Basophils Absolute: 0 10*3/uL (ref 0.0–0.1)
Basophils Relative: 0 %
Eosinophils Absolute: 0 10*3/uL (ref 0.0–0.7)
Eosinophils Relative: 0 %
HCT: 35.5 % — ABNORMAL LOW (ref 36.0–46.0)
Hemoglobin: 11.6 g/dL — ABNORMAL LOW (ref 12.0–15.0)
Lymphocytes Relative: 7 %
Lymphs Abs: 1.4 10*3/uL (ref 0.7–4.0)
MCH: 28.4 pg (ref 26.0–34.0)
MCHC: 32.7 g/dL (ref 30.0–36.0)
MCV: 87 fL (ref 78.0–100.0)
Monocytes Absolute: 0.9 10*3/uL (ref 0.1–1.0)
Monocytes Relative: 5 %
Neutro Abs: 18.3 10*3/uL — ABNORMAL HIGH (ref 1.7–7.7)
Neutrophils Relative %: 88 %
Platelets: 281 10*3/uL (ref 150–400)
RBC: 4.08 MIL/uL (ref 3.87–5.11)
RDW: 14.1 % (ref 11.5–15.5)
WBC: 20.6 10*3/uL — ABNORMAL HIGH (ref 4.0–10.5)

## 2015-09-14 LAB — URINALYSIS, ROUTINE W REFLEX MICROSCOPIC
Bilirubin Urine: NEGATIVE
Glucose, UA: NEGATIVE mg/dL
Ketones, ur: NEGATIVE mg/dL
Nitrite: NEGATIVE
Protein, ur: 30 mg/dL — AB
Specific Gravity, Urine: 1.013 (ref 1.005–1.030)
Urobilinogen, UA: 0.2 mg/dL (ref 0.0–1.0)
pH: 8.5 — ABNORMAL HIGH (ref 5.0–8.0)

## 2015-09-14 LAB — POC OCCULT BLOOD, ED: Fecal Occult Bld: POSITIVE — AB

## 2015-09-14 LAB — LACTIC ACID, PLASMA: Lactic Acid, Venous: 1.4 mmol/L (ref 0.5–2.0)

## 2015-09-14 LAB — LIPASE, BLOOD: Lipase: 12 U/L — ABNORMAL LOW (ref 22–51)

## 2015-09-14 LAB — MRSA PCR SCREENING: MRSA by PCR: NEGATIVE

## 2015-09-14 LAB — I-STAT CG4 LACTIC ACID, ED: Lactic Acid, Venous: 1.61 mmol/L (ref 0.5–2.0)

## 2015-09-14 MED ORDER — ONDANSETRON HCL 4 MG PO TABS: 4.0000 mg | ORAL_TABLET | Freq: Four times a day (QID) | ORAL | Status: DC | PRN

## 2015-09-14 MED ORDER — LEVOTHYROXINE SODIUM 150 MCG PO TABS
150.0000 ug | ORAL_TABLET | ORAL | Status: DC
  Administered 2015-09-15 – 2015-09-17 (×3): 150 ug via ORAL
  Filled 2015-09-14: qty 1
  Filled 2015-09-14 (×2): qty 2
  Filled 2015-09-14: qty 1

## 2015-09-14 MED ORDER — IOHEXOL 300 MG/ML  SOLN
25.0000 mL | Freq: Once | INTRAMUSCULAR | Status: AC | PRN
  Administered 2015-09-14: 25 mL via ORAL

## 2015-09-14 MED ORDER — SODIUM CHLORIDE 0.9 % IV BOLUS (SEPSIS)
1000.0000 mL | Freq: Once | INTRAVENOUS | Status: AC
  Administered 2015-09-14: 1000 mL via INTRAVENOUS

## 2015-09-14 MED ORDER — METRONIDAZOLE IN NACL 5-0.79 MG/ML-% IV SOLN
500.0000 mg | Freq: Once | INTRAVENOUS | Status: AC
  Administered 2015-09-14: 500 mg via INTRAVENOUS
  Filled 2015-09-14: qty 100

## 2015-09-14 MED ORDER — VALACYCLOVIR HCL 500 MG PO TABS
1000.0000 mg | ORAL_TABLET | Freq: Three times a day (TID) | ORAL | Status: AC
  Administered 2015-09-14 – 2015-09-15 (×4): 1000 mg via ORAL
  Filled 2015-09-14 (×6): qty 2

## 2015-09-14 MED ORDER — CIPROFLOXACIN IN D5W 400 MG/200ML IV SOLN
400.0000 mg | Freq: Once | INTRAVENOUS | Status: AC
  Administered 2015-09-14: 400 mg via INTRAVENOUS
  Filled 2015-09-14: qty 200

## 2015-09-14 MED ORDER — DIPHENHYDRAMINE HCL 25 MG PO CAPS
50.0000 mg | ORAL_CAPSULE | Freq: Once | ORAL | Status: AC
  Administered 2015-09-14: 50 mg via ORAL
  Filled 2015-09-14: qty 2

## 2015-09-14 MED ORDER — PIPERACILLIN-TAZOBACTAM 3.375 G IVPB
3.3750 g | Freq: Three times a day (TID) | INTRAVENOUS | Status: DC
  Administered 2015-09-15 – 2015-09-17 (×7): 3.375 g via INTRAVENOUS
  Filled 2015-09-14 (×11): qty 50

## 2015-09-14 MED ORDER — TRAMADOL HCL 50 MG PO TABS
50.0000 mg | ORAL_TABLET | Freq: Four times a day (QID) | ORAL | Status: DC | PRN
  Administered 2015-09-14 – 2015-09-17 (×7): 50 mg via ORAL
  Filled 2015-09-14 (×7): qty 1

## 2015-09-14 MED ORDER — SODIUM CHLORIDE 0.9 % IV BOLUS (SEPSIS)
1000.0000 mL | INTRAVENOUS | Status: AC
  Administered 2015-09-14 – 2015-09-15 (×2): 1000 mL via INTRAVENOUS

## 2015-09-14 MED ORDER — HYDROCHLOROTHIAZIDE 25 MG PO TABS
25.0000 mg | ORAL_TABLET | Freq: Every day | ORAL | Status: DC
  Administered 2015-09-14 – 2015-09-17 (×4): 25 mg via ORAL
  Filled 2015-09-14 (×4): qty 1

## 2015-09-14 MED ORDER — FAMOTIDINE 20 MG PO TABS
10.0000 mg | ORAL_TABLET | Freq: Every day | ORAL | Status: DC
  Administered 2015-09-14: 10 mg via ORAL
  Filled 2015-09-14: qty 1

## 2015-09-14 MED ORDER — ONDANSETRON HCL 4 MG/2ML IJ SOLN
4.0000 mg | Freq: Four times a day (QID) | INTRAMUSCULAR | Status: DC | PRN
  Administered 2015-09-16 – 2015-09-17 (×3): 4 mg via INTRAVENOUS
  Filled 2015-09-14 (×4): qty 2

## 2015-09-14 MED ORDER — ONDANSETRON HCL 4 MG/2ML IJ SOLN
4.0000 mg | Freq: Once | INTRAMUSCULAR | Status: AC
  Administered 2015-09-14: 4 mg via INTRAVENOUS
  Filled 2015-09-14: qty 2

## 2015-09-14 MED ORDER — SIMVASTATIN 40 MG PO TABS
40.0000 mg | ORAL_TABLET | Freq: Every day | ORAL | Status: DC
  Administered 2015-09-14 – 2015-09-16 (×3): 40 mg via ORAL
  Filled 2015-09-14 (×3): qty 1

## 2015-09-14 MED ORDER — IOHEXOL 300 MG/ML  SOLN
100.0000 mL | Freq: Once | INTRAMUSCULAR | Status: AC | PRN
  Administered 2015-09-14: 100 mL via INTRAVENOUS

## 2015-09-14 MED ORDER — SENNA 8.6 MG PO TABS: 1.0000 | ORAL_TABLET | Freq: Every day | ORAL | Status: DC | PRN

## 2015-09-14 MED ORDER — PANTOPRAZOLE SODIUM 40 MG PO TBEC
40.0000 mg | DELAYED_RELEASE_TABLET | Freq: Every day | ORAL | Status: DC
  Administered 2015-09-14: 40 mg via ORAL
  Filled 2015-09-14: qty 1

## 2015-09-14 MED ORDER — LEVOTHYROXINE SODIUM 25 MCG PO TABS: 225.0000 ug | ORAL_TABLET | ORAL | Status: DC

## 2015-09-14 MED ORDER — IRBESARTAN 300 MG PO TABS
300.0000 mg | ORAL_TABLET | Freq: Every day | ORAL | Status: DC
  Administered 2015-09-14 – 2015-09-17 (×4): 300 mg via ORAL
  Filled 2015-09-14 (×4): qty 1

## 2015-09-14 MED ORDER — PIPERACILLIN-TAZOBACTAM 3.375 G IVPB 30 MIN
3.3750 g | Freq: Once | INTRAVENOUS | Status: AC
  Administered 2015-09-14: 3.375 g via INTRAVENOUS
  Filled 2015-09-14: qty 50

## 2015-09-14 NOTE — Progress Notes (Addendum)
ANTIBIOTIC CONSULT NOTE - INITIAL  Pharmacy Consult for Zosyn Indication: intra-abdominal infection  Allergies  Allergen Reactions  . Niacin Other (See Comments) and Cough    "cough til I threw up" (12-Jan-2013)  . Neomycin-Bacitracin Zn-Polymyx Rash    Polysporin- is tolerated   . Ciprofloxacin Hives    Got cipro and flagyl at same time, localized hives to IV arm  . Flagyl [Metronidazole] Hives    Got cipro and flagyl at same time, localized hives to IV arm    Patient Measurements:     Vital Signs: Temp: 98.2 F (36.8 C) (10/02 2045) Temp Source: Oral (10/02 2045) BP: 169/56 mmHg (10/02 2045) Pulse Rate: 89 (10/02 2045) Intake/Output from previous day:   Intake/Output from this shift:    Labs:  Recent Labs  09/14/15 1522  WBC 20.6*  HGB 11.6*  PLT 281  CREATININE 0.73   Estimated Creatinine Clearance: 69.3 mL/min (by C-G formula based on Cr of 0.73). No results for input(s): VANCOTROUGH, VANCOPEAK, VANCORANDOM, GENTTROUGH, GENTPEAK, GENTRANDOM, TOBRATROUGH, TOBRAPEAK, TOBRARND, AMIKACINPEAK, AMIKACINTROU, AMIKACIN in the last 72 hours.   Microbiology: No results found for this or any previous visit (from the past 720 hour(s)).  Medical History: Past Medical History  Diagnosis Date  . Allergic rhinitis   . GERD (gastroesophageal reflux disease)   . Hyperlipidemia   . Chicken pox as a child  . Measles as a child  . Insomnia   . Thyroid disease   . Constipation   . Diverticulitis 10/25/2012    pt. reports that a drain was placed - 09/2012    . Abnormal cervical cytology 10/25/2012    Follows with Dr Leavy Cella of Gyn  . Asthma   . Heart murmur   . Complication of anesthesia   . PONV (postoperative nausea and vomiting)   . HTN (hypertension)     stress test completed by Anselm Lis, diagnosed as GERD  . Thyroid cancer ([place]) 1980's  . Hypothyroidism   . H/O hiatal hernia   . External hemorrhoid, bleeding     "sometimes" (01/12/13)  . Arthritis      "knees; right thumb; shoulders" (01/12/2013)  . Kidney stones 1970's    "passed on their own" (01-12-13)  . OSA on CPAP   . UTI (urinary tract infection) 04/02/2013  . Anemia 10/06/2013  . Preventative health care 10/06/2013    [facility] of Dermatology Pneumovax in 2012  . Low back pain 06/09/2014  . Medicare annual wellness visit, subsequent 10/06/2013    [facility] of Dermatology Pneumovax in 2012   . Paresthesia 03/23/2015    Left face    Assessment: 84 YOF who has had intermittent lower abdominal pain- working with OB-GYN and possibly bariatric surgery to have adnexal mass/enlarged ovary removed hopefully at the same time a hernia is fixed.   WBC elevated at 20.6, afeb in the ED. A 1 time dose of Zosyn 3.375 has been ordered in the ED.  SCr 0.73, CrCl ~65-48mL/min  Goal of Therapy:  eradication of infection  Plan:  -Zosyn 3.375g IV q8h EI -follow c/s, renal function, clinical progression  Caitlen Worth D. Mylen Mangan, PharmD, BCPS Clinical Pharmacist Pager: [phone] 09/14/2015 9:32 PM

## 2015-09-14 NOTE — H&P (Signed)
History and Physical  Jackie Stevens  ZOX:[id number]  DOB: 1945/12/30  DOA: 09/14/2015  Referring physician: Gloriann Loan, PA-C PCP: Penni Homans, MD   Chief Complaint: "I've been having abdominal pain, sweats, and rectal bleeding."  HPI: Jackie Stevens is a 69 y.o. female with a past medical history significant for hypertension, asthma, hypothyroidism, and a history of diverticular abscess status post resection with large ventral hernia who presents with 1 day left lower quadrant pain, sweats, and bloody stools.  The patient was in her usual state of health until the night before admission when she had an episode of moderate to severe left lower quadrant pain near her hernia, accompanied by sweats, vomiting several times, subjective fevers, and then several loose bowel movements, which were mixed with bright red blood. This morning she felt better until midday when she had more cramps and rectal bleeding.  In the ED, the patient at a low grade fever, tachycardia, and leukocytosis. There was frank blood on rectal exam, and a CT of the abdomen and pelvis with contrast showed an area of transverse colon with thickening and inflammatory change adjacent to bowel within the hernia, without radiographic findings of strangulation or diverticulitis. The patient was given ciprofloxacin and metronidazole and developed hives near her IV site. She was then admitted to [facility] for early sepsis related to colitis. Of note she was prescribed doxycycline by her dermatologist within the last month for a skin rash.   Review of Systems:  Patient seen 7:05 PM on 09/14/2015. Pt complains of abdominal pain, sweats, fevers, loose stools, vomiting, fatigue. Otherwise, twelve systems were reviewed and were negative except as just noted or noted in the history of present illness.  Past Medical History  Diagnosis Date  . Allergic rhinitis   . GERD (gastroesophageal reflux disease)   . Hyperlipidemia   . Chicken pox as a  child  . Measles as a child  . Insomnia   . Thyroid disease   . Constipation   . Diverticulitis 10/25/2012    pt. reports that a drain was placed - 09/2012    . Abnormal cervical cytology 10/25/2012    Follows with Dr Leavy Cella of Gyn  . Asthma   . Heart murmur   . Complication of anesthesia   . PONV (postoperative nausea and vomiting)   . HTN (hypertension)     stress test completed by Anselm Lis, diagnosed as GERD  . Thyroid cancer ([place]) 1980's  . Hypothyroidism   . H/O hiatal hernia   . External hemorrhoid, bleeding     "sometimes" (01/17/13)  . Arthritis     "knees; right thumb; shoulders" (01-17-2013)  . Kidney stones 1970's    "passed on their own" (17-Jan-2013)  . OSA on CPAP   . UTI (urinary tract infection) 04/02/2013  . Anemia 10/06/2013  . Preventative health care 10/06/2013    [facility] of Dermatology Pneumovax in 2012  . Low back pain 06/09/2014  . Medicare annual wellness visit, subsequent 10/06/2013    [facility]inhoffer of Dermatology Pneumovax in 2012   . Paresthesia 03/23/2015    Left face  The above past medical history was reviewed.  Past Surgical History  Procedure Laterality Date  . Thyroidectomy, partial  1988    "then did iodine to remove the rest" (01-17-2013)  . Sigmoid resection / rectopexy  January 17, 2013  . Colon surgery    . Tonsillectomy  1951?  Marland Kitchen Cholecystectomy  1990  . Vaginal hysterectomy  1970's    "still  have my ovaries" (12/19/2012)  . Dilation and curettage of uterus  1960's    "lots of them; had miscarriages" (12/19/2012)  . Transrectal drainage of pelvic abscess  10/27/2012  . Colostomy revision  12/19/2012    Procedure: COLON RESECTION SIGMOID;  Surgeon: Gwenyth Ober, MD;  Location: [place];  Service: General;  Laterality: N/A;  . Cystoscopy with stent placement  12/19/2012    Procedure: CYSTOSCOPY WITH STENT PLACEMENT;  Surgeon: Hanley Ben, MD;  Location: [place];  Service: Urology;  Laterality: N/A;  The above surgical history was  reviewed.  Social History: Patient lives at home.  She is married. 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 She is a nonsmoker.  Allergies  Allergen Reactions  . Niacin Other (See Comments) and Cough    "cough til I threw up" (12/19/2012)  . Neomycin-Bacitracin Zn-Polymyx Rash    Polysporin- is tolerated   . Ciprofloxacin Hives    Got cipro and flagyl at same time, localized hives to IV arm  . Flagyl [Metronidazole] Hives    Got cipro and flagyl at same time, localized hives to IV arm    Family History  Problem Relation Age of Onset  . Heart disease Father   . Pneumonia Father   . Hypertension Father   . Hyperlipidemia Father   . Cancer Father     skin  . Stroke Father   . Cancer Paternal Aunt   . Alzheimer's disease Mother   . Heart disease Mother   . Emphysema Brother     marijuana and cigarettes  . Alcohol abuse Brother   . Hearing loss Brother   . Diabetes Maternal Grandmother   . Alzheimer's disease Paternal Grandmother   . Cancer Paternal Grandmother     lung?- smoker  . Hyperlipidemia Paternal Grandmother   . Heart attack Paternal Grandfather   . Alcohol abuse Paternal Grandfather   . Neurofibromatosis Son     schwanomatosis  . Neurofibromatosis Son     swanomatosis    Prior to Admission medications   Medication Sig Start Date End Date Taking? Authorizing Provider  clobetasol cream (TEMOVATE) 9.62 % Apply 1 application topically 2 (two) times daily.  09/03/15  Yes Historical Provider, MD  hydrochlorothiazide (HYDRODIURIL) 25 MG tablet Take 1 tablet (25 mg total) by mouth daily. 04/28/15  Yes Mosie Lukes, MD  hydrOXYzine (ATARAX/VISTARIL) 10 MG tablet Take 1 tablet (10 mg total) by mouth every 8 (eight) hours as needed. Patient taking differently: Take 10 mg by mouth every 8 (eight) hours as needed for itching.  08/25/15  Yes Edward Saguier, PA-C  KRILL OIL PO Take 500 mg by mouth daily.    Yes Historical Provider, MD  levothyroxine (SYNTHROID,  LEVOTHROID) 150 MCG tablet Take 150-225 mcg by mouth daily. 1 tablet (150 mcg) every day except Sunday, 1.5 tablet (225 mcg) on Sunday   Yes Historical Provider, MD  naproxen (NAPROSYN) 375 MG tablet TAKE 1 TABLET (375 MG TOTAL) BY MOUTH 2 (TWO) TIMES DAILY WITH A MEAL. 08/05/15  Yes Mosie Lukes, MD  omeprazole (PRILOSEC) 20 MG capsule TAKE 1 CAPSULE EVERY MORNING AT [facility] 05/27/15  Yes Mosie Lukes, MD  ranitidine (ZANTAC) 150 MG tablet TAKE 1 TABLET BY MOUTH AT BEDTIME 07/28/15  Yes Mosie Lukes, MD  senna (SENOKOT) 8.6 MG TABS tablet Take 1 tablet by mouth daily as needed for mild constipation.   Yes Historical Provider, MD  simvastatin (ZOCOR) 40 MG tablet TAKE 1 TABLET (  40 MG TOTAL) BY MOUTH AT BEDTIME. 05/27/15  Yes Mosie Lukes, MD  sodium chloride (OCEAN) 0.65 % SOLN nasal spray Place 1 spray into both nostrils at bedtime.   Yes Historical Provider, MD  TURMERIC PO Take 1 tablet by mouth daily.    Yes Historical Provider, MD  valsartan (DIOVAN) 320 MG tablet Take 1 tablet (320 mg total) by mouth daily. 04/28/15  Yes Mosie Lukes, MD  vitamin B-12 (CYANOCOBALAMIN) 1000 MCG tablet Take 2,500 mcg by mouth daily.   Yes Historical Provider, MD  Vitamin D, Cholecalciferol, 1000 UNITS CAPS Take 1 capsule by mouth daily.   Yes Historical Provider, MD  albuterol (PROVENTIL HFA;VENTOLIN HFA) 108 (90 BASE) MCG/ACT inhaler Inhale 2 puffs into the lungs every 6 (six) hours as needed for wheezing. 04/02/13   Mosie Lukes, MD  aspirin EC 81 MG tablet Take 81 mg by mouth at bedtime.    Historical Provider, MD  cetirizine (ZYRTEC) 10 MG tablet Take 1 tablet (10 mg total) by mouth daily. 04/02/13   Mosie Lukes, MD  fluticasone (FLONASE) 50 MCG/ACT nasal spray Place 2 sprays into both nostrils daily. 01/22/14   Debbrah Alar, NP  NEOMYCIN-POLYMYXIN-HYDROCORTISONE (CORTISPORIN) 1 % SOLN otic solution Place 3 drops into both ears 2 (two) times daily as needed. For 5 days. Patient not taking:  Reported on 09/14/2015 03/05/15   Mosie Lukes, MD  NITROSTAT 0.4 MG SL tablet PLACE 1 TABLET UNDER THE TONGUE EVERY 5 MINUTES AS NEEDED FOR CHEST PAIN 05/21/14   Mosie Lukes, MD  Polyethyl Glycol-Propyl Glycol 0.4-0.3 % GEL Apply 1 drop to eye at bedtime.    Historical Provider, MD  Probiotic Product (PROBIOTIC ADVANCED PO) Take 2 tablets by mouth daily. Digestive Advantage    Historical Provider, MD  psyllium (REGULOID) 0.52 G capsule Take 0.52 g by mouth at bedtime.    Historical Provider, MD  traMADol (ULTRAM) 50 MG tablet Take 1 tablet (50 mg total) by mouth every 6 (six) hours as needed. Patient taking differently: Take 50 mg by mouth every 6 (six) hours as needed for moderate pain.  07/29/15   Mosie Lukes, MD  valACYclovir (VALTREX) 1000 MG tablet Take 1 tablet (1,000 mg total) by mouth 3 (three) times daily. 09/09/15   Mosie Lukes, MD    Physical Exam: BP 162/70 mmHg  Pulse 102  Temp(Src) 99.1 F (Oral)  Resp 14  Ht 5\' 2"  (1.575 m)  Wt 92.8 kg (204 lb 9.4 oz)  BMI 37.41 kg/m2  SpO2 92% General: Well-developed, adult female, alert and in no acute distress.  Responds appropriately to questions.   HEENT: Head normal.   Ears: Normal auditory acuity. Eyes: Sclerae normal without icterus, conjunctiva pink, lids and lashes normal.  PERRL and EOMI.   Nose: No deformity, discharge, or epistaxis.   Mouth: OP moist without erythema, exudates, cobblestoning, or ulcers.  No airway deformities.   Skin: Warm and dry.  No jaundice.  No suspicious rashes or lesions, other than fading hyperpigmentation on left inner forearm. Capillary refill is less than 2 seconds.   Cardiac: Tachycardic, regular, nl S1-S2, no murmurs appreciated.  No LE edema.  Radial and DP pulses 2+ and symmetric. Respiratory: Normal respiratory rate and rhythm.  CTAB without rales or wheezes. Abdomen: BS present.  Abdomen soft without rigidity.  Moderate TTP in LLQ, especially in region of left hernia, without rebound. No  firmness or redness or tenseness.  No ascites, distension.  Neuro: Sensorium intact.  Cranial nerves grossly intact.  Speech is fluent.  Attention and concentration are normal.  Memory seems intact.  Moves all extremities equally and with normal coordination.    Psych: Appropriate affect.  Speech normal. Thought content/process linear/appropriate.  No evidence of aural or visual hallucinations or delusions.       Labs on Admission:  The metabolic panel is notable for normal sodium, potassium, bicarbonate, and renal function. Normal glucose.  The complete blood count is notable for leukocytosis.  Slight normocytic anemia, hemoglobin 11.6 g/dL.  Normal transaminases and bilirubin.  Fecal occult blood testing positive Urinalysis shows positive white blood cells and bacteria. C. difficile pending      Radiological Exams on Admission: Personally reviewed: Ct Abdomen Pelvis W Contrast 09/14/2015  IMPRESSION:  1. Right and left periumbilical abdominal wall hernias. Transverse colon is involved with both hernias with no evidence of obstruction.  2. Moderate inflammatory change involving the distal third of the transverse colon to the splenic flexure with mild inflammation involving proximal descending colon as well. The distal portion of large bowel contained within the second hernia sac may also be inflamed. Although large bowel is involved with abdominal wall hernias, and although there is evidence of a single inflamed epiploic appendage, the appearance is not particularly consistent with either strangulation or epiploic appendagitis. Essentially there is nonspecific moderately prominent inflammation of the large bowel in the portion described above consistent with nonspecific colitis.  3. Known cystic right adnexal mass. As previously described in prior report, pelvic MRI recommended as malignancy is not excluded. 4. Mild infiltrate medial left lower lobe. Pneumonitis not excluded. This could  represent atelectasis.         Assessment/Plan Principal Problem:   Sepsis ([place]) Active Problems:   Hyperlipidemia, mixed   Obstructive sleep apnea   Essential hypertension   G E R D   Thyroid disease   Asthma, mild intermittent   Anemia   Colitis  1. Colitis with early sepsis:  Per radiology, this does not appear to be strangulation. The differential includes C. difficile colitis, other infectious colitis, inflammatory colitis. Diverticular bleeding is doubted, and no diverticulitis was noted.  The case was discussed with Dr. Ninfa Linden from general surgery by phone who evaluated the patient and does not suspect strength relation leading to ischemic bowel.  The case was also discussed with Dr. Fuller Plan from gastroenterology, who agrees with the treatment plan and will evaluate the patient tomorrow. There is no active bleeding at this time. -Piperacillin tazobactam 3.375 g every 8 hours -C. difficile testing pending -Stool culture -The patient did receive a 30 ml/kg normal saline bolus -Trend CBC tomorrow -Clear liquid diet, advance as tolerated. -Gen. surgery consulted, appreciate recommendations and willingness to see overnight -Gastroenterology consulted, appreciate recommendations  2. Hypertension:  Stable. -Continue home HCTZ and ARB -Hold home aspirin while in the hospital  3. GERD and chronic constipation:  Hold senna, PPI and ranitidine in setting of concern for C. Difficile.  May restart if negative.  4. Hypothyroidism:  stable. -Continue home levothyroxine  5. Chronic anemia:  This appears to be a chronic iron deficiency anemia. It is acutely worse in the setting of rectal bleeding, from blood loss. However the drop in hemoglobin is small. -Vitamin B12, folate, ferritin, iron panel -Trend CBC  6. Rash:  This is located on the left forearm. It is improving on her current therapies. -Continue steroid cream -Continue valacyclovir  7. Asthma with allergic  rhinitis:  stable. -  Continue albuterol when necessary, cetirizine and Flonase    DVT PPx: SCDs Diet: Clear liquid, advance as tolerated Consultants: Gen surg, GI Code Status: Full    Disposition Plan:  The appropriate admission status for this patient is INPATIENT. Inpatient status is judged to be reasonable and necessary in order to provide the required intensity of service to ensure the patient's safety. The patient's presenting symptoms, physical exam findings, and initial radiographic and laboratory data in the context of their chronic comorbidities is felt to place them at high risk for further clinical deterioration. Furthermore, it is not anticipated that the patient will be medically stable for discharge from the hospital within 2 midnights of admission. The following factors support the admission status of inpatient.   A. The patient's presenting symptoms include rectal bleeding, tachycardia, abdominal pain B. The worrisome physical exam findings include abdominal tenderness C. The initial radiographic and laboratory data are worrisome because of inflammed bowel, leukocytosis D. The chronic co-morbidities include HTN, asthma, thryroid disease, anemia E. Patient requires inpatient status due to high intensity of service, high risk for further deterioration and high frequency of surveillance required. F. I certify that at the point of admission it is my clinical judgment that the patient will require inpatient hospital care spanning beyond 2 midnights from the point of admission.    Edwin Dada [facility] Pager [phone]

## 2015-09-14 NOTE — Assessment & Plan Note (Signed)
Well controlled, no changes to meds. Encouraged heart healthy diet such as the DASH diet and exercise as tolerated.  °

## 2015-09-14 NOTE — ED Notes (Signed)
Patient transported to CT 

## 2015-09-14 NOTE — Assessment & Plan Note (Signed)
Encouraged heart healthy diet, increase exercise, avoid trans fats, consider a krill oil cap daily 

## 2015-09-14 NOTE — ED Provider Notes (Signed)
CSN: [id number]     Arrival date & time 09/14/15  1350 History   First MD Initiated Contact with Patient 09/14/15 1431     Chief Complaint  Patient presents with  . Rectal Bleeding     (Consider location/radiation/quality/duration/timing/severity/associated sxs/prior Treatment) HPI   Jackie Stevens is a 69 y.o. female with PMH significant for diverticulosis s/p bowel resection, hemorrhoids, constipation, HTN who presents with 1 day history of rectal bleeding along with N/V and lower abdominal pain.  Pt states she has had 6 episodes of NBNB emesis.  Decreased PO intake.  She felt she had been unable to move her bowels, and then last night she noticed BRB on the tissue and then the subsequent times she noticed BRB in the toilet bowl.  She denies blood in her underpants, it is only when she moves her bowels.  Endorses fatigue, nausea, chills, night sweats, HA, abdominal pain.  Denies rectal pain, CP, SOB, syncope, fevers, dizziness, or lightheadedness.   Past Medical History  Diagnosis Date  . Allergic rhinitis   . GERD (gastroesophageal reflux disease)   . Hyperlipidemia   . Chicken pox as a child  . Measles as a child  . Insomnia   . Thyroid disease   . Constipation   . Diverticulitis 10/25/2012    pt. reports that a drain was placed - 09/2012    . Abnormal cervical cytology 10/25/2012    Follows with Dr Leavy Cella of Gyn  . Asthma   . Heart murmur   . Complication of anesthesia   . PONV (postoperative nausea and vomiting)   . HTN (hypertension)     stress test completed by Anselm Lis, diagnosed as GERD  . Thyroid cancer ([place]) 1980's  . Hypothyroidism   . H/O hiatal hernia   . External hemorrhoid, bleeding     "sometimes" (Jan 06, 2013)  . Arthritis     "knees; right thumb; shoulders" (January 06, 2013)  . Kidney stones 1970's    "passed on their own" (01-06-13)  . OSA on CPAP   . UTI (urinary tract infection) 04/02/2013  . Anemia 10/06/2013  . Preventative health care  10/06/2013    [facility] Pneumovax in 2012  . Low back pain 06/09/2014  . Medicare annual wellness visit, subsequent 10/06/2013    [facility] D[facility]at[facility] Pneumovax in 2012   . Paresthesia 03/23/2015    Left face   Past Surgical History  Procedure Laterality Date  . Thyroidectomy, partial  1988    "then did iodine to remove the rest" (01-06-2013)  . Sigmoid resection / rectopexy  2013/01/06  . Colon surgery    . Tonsillectomy  1951?  Marland Kitchen Cholecystectomy  1990  . Vaginal hysterectomy  1970's    "still have my ovaries" (01/06/13)  . Dilation and curettage of uterus  1960's    "lots of them; had miscarriages" (01/06/2013)  . Transrectal drainage of pelvic abscess  10/27/2012  . Colostomy revision  January 06, 2013    Procedure: COLON RESECTION SIGMOID;  Surgeon: Gwenyth Ober, MD;  Location: [facility];  Service: General;  Laterality: N/A;  . Cystoscopy with stent placement  Jan 06, 2013    Procedure: CYSTOSCOPY WITH STENT PLACEMENT;  Surgeon: Hanley Ben, MD;  Location: [facility];  Service: Urology;  Laterality: N/A;   Family History  Problem Relation Age of Onset  . Heart disease Father   . Pneumonia Father   . Hypertension Father   . Hyperlipidemia Father   . Cancer Father  skin  . Stroke Father   . Cancer Paternal Aunt   . Alzheimer's disease Mother   . Heart disease Mother   . Emphysema Brother     marijuana and cigarettes  . Alcohol abuse Brother   . Hearing loss Brother   . Diabetes Maternal Grandmother   . Alzheimer's disease Paternal Grandmother   . Cancer Paternal Grandmother     lung?- smoker  . Hyperlipidemia Paternal Grandmother   . Heart attack Paternal Grandfather   . Alcohol abuse Paternal Grandfather   . Neurofibromatosis Son     schwanomatosis  . Neurofibromatosis Son     swanomatosis   Social History  Substance Use Topics  . Smoking status: Never Smoker   . Smokeless tobacco: Never Used  . Alcohol Use: 0.0 oz/week    0 Standard drinks or  equivalent per week     Comment: 12/19/2012 "glass of wine q night q other month or so"   OB History    No data available     Review of Systems  All other systems negative unless otherwise stated in HPI   Allergies  Niacin and Neomycin-bacitracin zn-polymyx  Home Medications   Prior to Admission medications   Medication Sig Start Date End Date Taking? Authorizing Provider  albuterol (PROVENTIL HFA;VENTOLIN HFA) 108 (90 BASE) MCG/ACT inhaler Inhale 2 puffs into the lungs every 6 (six) hours as needed for wheezing. 04/02/13   Mosie Lukes, MD  aspirin EC 81 MG tablet Take 81 mg by mouth at bedtime.    Historical Provider, MD  cetirizine (ZYRTEC) 10 MG tablet Take 1 tablet (10 mg total) by mouth daily. 04/02/13   Mosie Lukes, MD  clobetasol cream (TEMOVATE) 0.05 %  09/03/15   Historical Provider, MD  doxycycline (VIBRA-TABS) 100 MG tablet Take 1 tablet (100 mg total) by mouth 2 (two) times daily. 09/01/15   Percell Miller Saguier, PA-C  fluticasone (FLONASE) 50 MCG/ACT nasal spray Place 2 sprays into both nostrils daily. 01/22/14   Debbrah Alar, NP  hydrochlorothiazide (HYDRODIURIL) 25 MG tablet Take 1 tablet (25 mg total) by mouth daily. 04/28/15   Mosie Lukes, MD  hydrOXYzine (ATARAX/VISTARIL) 10 MG tablet Take 1 tablet (10 mg total) by mouth every 8 (eight) hours as needed. 08/25/15   Edward Saguier, PA-C  KRILL OIL PO Take 500 mg by mouth daily.     Historical Provider, MD  levothyroxine (SYNTHROID, LEVOTHROID) 150 MCG tablet Take 150-225 mcg by mouth daily. 1 tablet (150 mcg) every day except Sunday, 1.5 tablet (225 mcg) on Sunday    Historical Provider, MD  naproxen (NAPROSYN) 375 MG tablet TAKE 1 TABLET (375 MG TOTAL) BY MOUTH 2 (TWO) TIMES DAILY WITH A MEAL. 08/05/15   Mosie Lukes, MD  NEOMYCIN-POLYMYXIN-HYDROCORTISONE (CORTISPORIN) 1 % SOLN otic solution Place 3 drops into both ears 2 (two) times daily as needed. For 5 days. 03/05/15   Mosie Lukes, MD  NITROSTAT 0.4 MG SL tablet  PLACE 1 TABLET UNDER THE TONGUE EVERY 5 MINUTES AS NEEDED FOR CHEST PAIN 05/21/14   Mosie Lukes, MD  omeprazole (PRILOSEC) 20 MG capsule TAKE 1 CAPSULE EVERY MORNING AT [facility] 05/27/15   Mosie Lukes, MD  Polyethyl Glycol-Propyl Glycol 0.4-0.3 % GEL Apply 1 drop to eye at bedtime.    Historical Provider, MD  Probiotic Product (PROBIOTIC ADVANCED PO) Take 2 tablets by mouth daily. Digestive Advantage    Historical Provider, MD  psyllium (REGULOID) 0.52 G capsule Take 0.52 g  by mouth at bedtime.    Historical Provider, MD  ranitidine (ZANTAC) 150 MG tablet TAKE 1 TABLET BY MOUTH AT BEDTIME 07/28/15   Mosie Lukes, MD  senna (SENOKOT) 8.6 MG TABS tablet Take 1 tablet by mouth daily as needed for mild constipation.    Historical Provider, MD  simvastatin (ZOCOR) 40 MG tablet TAKE 1 TABLET (40 MG TOTAL) BY MOUTH AT BEDTIME. 05/27/15   Mosie Lukes, MD  sodium chloride (OCEAN) 0.65 % SOLN nasal spray Place 1 spray into both nostrils at bedtime.    Historical Provider, MD  traMADol (ULTRAM) 50 MG tablet Take 1 tablet (50 mg total) by mouth every 6 (six) hours as needed. 07/29/15   Mosie Lukes, MD  TURMERIC PO Take 1 tablet by mouth daily.     Historical Provider, MD  valACYclovir (VALTREX) 1000 MG tablet Take 1 tablet (1,000 mg total) by mouth 3 (three) times daily. 09/09/15   Mosie Lukes, MD  valsartan (DIOVAN) 320 MG tablet Take 1 tablet (320 mg total) by mouth daily. 04/28/15   Mosie Lukes, MD  vitamin B-12 (CYANOCOBALAMIN) 1000 MCG tablet Take 2,500 mcg by mouth daily.    Historical Provider, MD  Vitamin D, Cholecalciferol, 1000 UNITS CAPS Take 1 capsule by mouth daily.    Historical Provider, MD   BP 162/70 mmHg  Pulse 102  Temp(Src) 99.1 F (37.3 C) (Oral)  Resp 18  SpO2 99% Physical Exam  Constitutional: She is oriented to person, place, and time. She appears well-developed and well-nourished.  HENT:  Head: Normocephalic and atraumatic.  Mouth/Throat: Oropharynx is clear and  moist.  Eyes: Pupils are equal, round, and reactive to light.  Neck: Normal range of motion. Neck supple.  Cardiovascular: Normal rate, regular rhythm and normal heart sounds.   No murmur heard. Pulmonary/Chest: Effort normal and breath sounds normal. No respiratory distress. She has no wheezes. She has no rales.  Abdominal: Soft. Bowel sounds are normal. She exhibits no distension. There is generalized tenderness. There is no rigidity, no rebound and no guarding.  Genitourinary:  On rectal exam: non-thrombosed hemorrhoids, mildly tender.  No masses felt on DRE, blood noted in stool.  Musculoskeletal: Normal range of motion.  Lymphadenopathy:    She has no cervical adenopathy.  Neurological: She is alert and oriented to person, place, and time.  Skin: Skin is warm and dry.  Psychiatric: She has a normal mood and affect. Her behavior is normal.    ED Course  Procedures (including critical care time) Labs Review Labs Reviewed - No data to display  Imaging Review No results found. I have personally reviewed and evaluated these images and lab results as part of my medical decision-making.   EKG Interpretation None      MDM   Final diagnoses:  None   Patient presents with N/V and rectal bleeding x 1 day.  VSS, NAD, patient appears non-toxic. On exam, generalized abdominal tenderness.  No rebound, guarding, or rigidity.  Labs ordered.  IVF and zofran.   WBC 20.6 Will CT abdomen.  CT abdomen shows moderate nonspecific inflammation of large bowel, consistent with nonspecific colitis.  C. Diff cx pending. Will treat as colitis with cipro and flagyl.  Will consult for admission  Admit to step down, per hospitalist.    Gloriann Loan, PA-C 09/14/15 [place], MD 09/18/15 [phone]

## 2015-09-14 NOTE — ED Notes (Signed)
Pt attempted to give urine sample but dropped specimen cup in toilet.

## 2015-09-14 NOTE — Consult Note (Signed)
Reason for Consult:incisional hernia and rectal bleeding Referring Physician: Dr. Myrene Buddy  Jackie Stevens is an 69 y.o. female.  HPI: This is a pleasant female with a known incisional hernia who was admitted with lower GI bleeding. She actually first developed some vague abdominal pain a couple of days ago and had several episodes of emesis. She reports she did not feel any specific change in her known hernia. She had been constipated but then began moving her bowels and noticed blood when she wiped and then bright blood in the toilet. She had a CAT scan of the abdomen and pelvis performed showing the incisional hernia containing transverse colon. There was findings consistent with colitis in colon distal to the hernia. There is no evidence of obstruction. She is otherwise without complaints and currently has minimal abdominal pain.  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 Hulen Skains and [place].  She currently has a known suspected benign 8 cm ovarian cyst. She has been evaluated by GYN oncology. She is supposed to see Dr. Hassell Done in our office on October 14 to be evaluated for hernia repair potentially in conjunction with an nephrectomy by Dr. Denman George.  Past Medical History  Diagnosis Date  . Allergic rhinitis   . GERD (gastroesophageal reflux disease)   . Hyperlipidemia   . Chicken pox as a child  . Measles as a child  . Insomnia   . Thyroid disease   . Constipation   . Diverticulitis 10/25/2012    pt. reports that a drain was placed - 09/2012    . Abnormal cervical cytology 10/25/2012    Follows with Dr Leavy Cella of Gyn  . Asthma   . Heart murmur   . Complication of anesthesia   . PONV (postoperative nausea and vomiting)   . HTN (hypertension)     stress test completed by Anselm Lis, diagnosed as GERD  . Thyroid cancer ([place]) 1980's  . Hypothyroidism   . H/O hiatal hernia   . External hemorrhoid, bleeding     "sometimes" (12-24-2012)  .  Arthritis     "knees; right thumb; shoulders" (2012/12/24)  . Kidney stones 1970's    "passed on their own" (2012-12-24)  . OSA on CPAP   . UTI (urinary tract infection) 04/02/2013  . Anemia 10/06/2013  . Preventative health care 10/06/2013    Steinhoffer of Dermatology Pneumovax in 2012  . Low back pain 06/09/2014  . Medicare annual wellness visit, subsequent 10/06/2013    Steinhoffer of Dermatology Pneumovax in 2012   . Paresthesia 03/23/2015    Left face    Past Surgical History  Procedure Laterality Date  . Thyroidectomy, partial  1988    "then did iodine to remove the rest" (12-24-2012)  . Sigmoid resection / rectopexy  2012/12/24  . Colon surgery    . Tonsillectomy  1951?  Marland Kitchen Cholecystectomy  1990  . Vaginal hysterectomy  1970's    "still have my ovaries" (Dec 24, 2012)  . Dilation and curettage of uterus  1960's    "lots of them; had miscarriages" (12/24/2012)  . Transrectal drainage of pelvic abscess  10/27/2012  . Colostomy revision  24-Dec-2012    Procedure: COLON RESECTION SIGMOID;  Surgeon: Gwenyth Ober, MD;  Location: [facility];  Service: General;  Laterality: N/A;  . Cystoscopy with stent placement  12-24-2012    Procedure: CYSTOSCOPY WITH STENT PLACEMENT;  Surgeon: Hanley Ben, MD;  Location: [facility];  Service: Urology;  Laterality: N/A;  Family History  Problem Relation Age of Onset  . Heart disease Father   . Pneumonia Father   . Hypertension Father   . Hyperlipidemia Father   . Cancer Father     skin  . Stroke Father   . Cancer Paternal Aunt   . Alzheimer's disease Mother   . Heart disease Mother   . Emphysema Brother     marijuana and cigarettes  . Alcohol abuse Brother   . Hearing loss Brother   . Diabetes Maternal Grandmother   . Alzheimer's disease Paternal Grandmother   . Cancer Paternal Grandmother     lung?- smoker  . Hyperlipidemia Paternal Grandmother   . Heart attack Paternal Grandfather   . Alcohol abuse Paternal Grandfather   . Neurofibromatosis Son      schwanomatosis  . Neurofibromatosis Son     swanomatosis    Social History:  reports that she has never smoked. She has never used smokeless tobacco. She reports that she drinks alcohol. She reports that she does not use illicit drugs.  Allergies:  Allergies  Allergen Reactions  . Niacin Other (See Comments) and Cough    "cough til I threw up" (12/19/2012)  . Neomycin-Bacitracin Zn-Polymyx Rash    Polysporin- is tolerated   . Ciprofloxacin Hives    Got cipro and flagyl at same time, localized hives to IV arm  . Flagyl [Metronidazole] Hives    Got cipro and flagyl at same time, localized hives to IV arm    Medications: I have reviewed the patient's current medications.  Results for orders placed or performed during the hospital encounter of 09/14/15 (from the past 48 hour(s))  CBC WITH DIFFERENTIAL     Status: Abnormal   Collection Time: 09/14/15  3:22 PM  Result Value Ref Range   WBC 20.6 (H) 4.0 - 10.5 K/uL   RBC 4.08 3.87 - 5.11 MIL/uL   Hemoglobin 11.6 (L) 12.0 - 15.0 g/dL   HCT 35.5 (L) 36.0 - 46.0 %   MCV 87.0 78.0 - 100.0 fL   MCH 28.4 26.0 - 34.0 pg   MCHC 32.7 30.0 - 36.0 g/dL   RDW 14.1 11.5 - 15.5 %   Platelets 281 150 - 400 K/uL   Neutrophils Relative % 88 %   Neutro Abs 18.3 (H) 1.7 - 7.7 K/uL   Lymphocytes Relative 7 %   Lymphs Abs 1.4 0.7 - 4.0 K/uL   Monocytes Relative 5 %   Monocytes Absolute 0.9 0.1 - 1.0 K/uL   Eosinophils Relative 0 %   Eosinophils Absolute 0.0 0.0 - 0.7 K/uL   Basophils Relative 0 %   Basophils Absolute 0.0 0.0 - 0.1 K/uL  Comprehensive metabolic panel     Status: Abnormal   Collection Time: 09/14/15  3:22 PM  Result Value Ref Range   Sodium 135 135 - 145 mmol/L   Potassium 3.8 3.5 - 5.1 mmol/L   Chloride 100 (L) 101 - 111 mmol/L   CO2 27 22 - 32 mmol/L   Glucose, Bld 122 (H) 65 - 99 mg/dL   BUN 14 6 - 20 mg/dL   Creatinine, Ser 0.73 0.44 - 1.00 mg/dL   Calcium 8.5 (L) 8.9 - 10.3 mg/dL   Total Protein 6.0 (L) 6.5 - 8.1  g/dL   Albumin 3.2 (L) 3.5 - 5.0 g/dL   AST 20 15 - 41 U/L   ALT 22 14 - 54 U/L   Alkaline Phosphatase 82 38 - 126 U/L   Total Bilirubin  0.8 0.3 - 1.2 mg/dL   GFR calc non Af Amer >60 >60 mL/min   GFR calc Af Amer >60 >60 mL/min    Comment: (NOTE) The eGFR has been calculated using the CKD EPI equation. This calculation has not been validated in all clinical situations. eGFR's persistently <60 mL/min signify possible Chronic Kidney Disease.    Anion gap 8 5 - 15  Lipase, blood     Status: Abnormal   Collection Time: 09/14/15  3:22 PM  Result Value Ref Range   Lipase 12 (L) 22 - 51 U/L  I-Stat CG4 Lactic Acid, ED     Status: None   Collection Time: 09/14/15  4:22 PM  Result Value Ref Range   Lactic Acid, Venous 1.61 0.5 - 2.0 mmol/L  POC occult blood, ED     Status: Abnormal   Collection Time: 09/14/15  4:45 PM  Result Value Ref Range   Fecal Occult Bld POSITIVE (A) NEGATIVE  Urinalysis, Routine w reflex microscopic (not at [facility])     Status: Abnormal   Collection Time: 09/14/15  5:23 PM  Result Value Ref Range   Color, Urine YELLOW YELLOW   APPearance CLOUDY (A) CLEAR   Specific Gravity, Urine 1.013 1.005 - 1.030   pH 8.5 (H) 5.0 - 8.0   Glucose, UA NEGATIVE NEGATIVE mg/dL   Hgb urine dipstick SMALL (A) NEGATIVE   Bilirubin Urine NEGATIVE NEGATIVE   Ketones, ur NEGATIVE NEGATIVE mg/dL   Protein, ur 30 (A) NEGATIVE mg/dL   Urobilinogen, UA 0.2 0.0 - 1.0 mg/dL   Nitrite NEGATIVE NEGATIVE   Leukocytes, UA LARGE (A) NEGATIVE  Urine microscopic-add on     Status: Abnormal   Collection Time: 09/14/15  5:23 PM  Result Value Ref Range   Squamous Epithelial / LPF RARE RARE   WBC, UA 11-20 <3 WBC/hpf   RBC / HPF 0-2 <3 RBC/hpf   Bacteria, UA MANY (A) RARE    Ct Abdomen Pelvis W Contrast  09/14/2015   CLINICAL DATA:  Left lower quadrant pain and rectal bleeding with 3 episodes starting last night, history of diverticulitis  EXAM: CT ABDOMEN AND PELVIS WITH CONTRAST  TECHNIQUE:  Multidetector CT imaging of the abdomen and pelvis was performed using the standard protocol following bolus administration of intravenous contrast.  CONTRAST:  14m OMNIPAQUE IOHEXOL 300 MG/ML  SOLN  COMPARISON:  08/14/2015  FINDINGS: Lower chest: Mild infiltrate in the posterior medial left lower lobe which may represent atelectasis. Pneumonia or pneumonitis not excluded.  Hepatobiliary: Status post cholecystectomy. Mild presumably related biliary dilatation  Pancreas: Neck  Spleen: Negative  Adrenals/Urinary Tract: Normal except at the bladder is compressed by mass effect from large cystic right adnexal mass  Stomach/Bowel: There is an anterior abdominal wall hernia in the right periumbilical region, with hernia sac measuring 10 cm. Loops of both small and large bowel are contained within this. There is also a left periumbilical hernia measuring about 10 cm. This contains large bowel. There is a nonobstructive bowel gas pattern. Stomach is decompressed. Small bowel is normal. Appendix is normal. The distal large bowel shows an area of anastomosis. New  Proximal transverse colon enters and exits right abdominal wall hernia. Immediately subsequent section of transverse colon enters and exits left periumbilical hernia. As the large bowel Re enters the abdominal cavity beyond the second hernia, there is moderate wall thickening of the distal transverse colon, with wall thickening possibly involving the distal portion of the large bowel contained within the hernia  as well. Moderate to severe large bowel wall thickening extends to the splenic flexure with mild large bowel wall thickening involving the proximal descending colon. There is mild surrounding inflammatory change. There is no pneumatosis. There is a 1.5 cm fat containing structure arising off of the splenic flexure image number 47 and 48 consistent with inflamed epiploic appendage.  Vascular/Lymphatic: No evidence of aneurysm. No significant adenopathy.   Reproductive: Known right cystic adnexal mass again identified unchanged at about 9 cm.  Other: No ascites  Musculoskeletal: No acute findings  IMPRESSION: 1. Right and left periumbilical abdominal wall hernias. Transverse colon is involved with both hernias with no evidence of obstruction. 2. Moderate inflammatory change involving the distal third of the transverse colon to the splenic flexure with mild inflammation involving proximal descending colon as well. The distal portion of large bowel contained within the second hernia sac may also be inflamed. Although large bowel is involved with abdominal wall hernias, and although there is evidence of a single inflamed epiploic appendage, the appearance is not particularly consistent with either strangulation or epiploic appendagitis. Essentially there is nonspecific moderately prominent inflammation of the large bowel in the portion described above consistent with nonspecific colitis. 3. Known cystic right adnexal mass. As previously described in prior report, pelvic MRI recommended as malignancy is not excluded. 4. Mild infiltrate medial left lower lobe. Pneumonitis not excluded. This could represent atelectasis.   Electronically Signed   By: Skipper Cliche M.D.   On: 09/14/2015 18:27    Review of Systems  All other systems reviewed and are negative.  Blood pressure 169/56, pulse 89, temperature 98.2 F (36.8 C), temperature source Oral, resp. rate 17, SpO2 99 %. Physical Exam  Constitutional: She is oriented to person, place, and time. She appears well-developed and well-nourished. No distress.  HENT:  Head: Normocephalic.  Right Ear: External ear normal.  Left Ear: External ear normal.  Nose: Nose normal.  Mouth/Throat: Oropharynx is clear and moist. No oropharyngeal exudate.  Eyes: Conjunctivae are normal. Pupils are equal, round, and reactive to light. Right eye exhibits no discharge. Left eye exhibits no discharge. No scleral icterus.  Neck:  Normal range of motion. No tracheal deviation present.  Cardiovascular: Normal rate, regular rhythm, normal heart sounds and intact distal pulses.   No murmur heard. Respiratory: Effort normal and breath sounds normal. No respiratory distress. She has no wheezes.  GI: Soft. She exhibits no distension. There is tenderness.  Mildly tender abdomen. It feels like I am able to reduce a significant amount of the hernia although I suspect not all the way.  Musculoskeletal: Normal range of motion. She exhibits no edema or tenderness.  Lymphadenopathy:    She has no cervical adenopathy.  Neurological: She is alert and oriented to person, place, and time.  Skin: Skin is warm and dry. No erythema.  Psychiatric: Her behavior is normal. Judgment normal.    Assessment/Plan: Incisional hernia and lower GI bleed  I do not believe that the transverse colon that is involved in the hernia has any ischemia that would be causing bright red blood per rectum. She may have some infectious colitis in the distal transverse and proximal descending colon. I do not think this is ischemic. Her last colonoscopy by her reports 2015 and was normal. I suspect the bleeding to probably be hemorrhoidal or maybe a diverticulum. Gastroenterology has been asked to evaluate the patient but I'm not sure if endoscopy will be necessary. Hopefully she will improve quickly and will  be able to follow-up with Dr. Hassell Done in the office for consultation regarding the hernia repair. We will follow her while she is in the hospital  [facility] A 09/14/2015, 9:37 PM

## 2015-09-14 NOTE — Assessment & Plan Note (Signed)
Using CPAP daily 

## 2015-09-14 NOTE — Assessment & Plan Note (Signed)
tsh suppressed, follows with endocrinology but should consider skipping one dose of Levothyroxine weekly

## 2015-09-14 NOTE — ED Notes (Signed)
PA and family at the bedside

## 2015-09-14 NOTE — ED Notes (Signed)
Pt sts rectal bleeding x 3 episodes starting last night

## 2015-09-14 NOTE — Assessment & Plan Note (Signed)
Avoid offending foods, start probiotics. Do not eat large meals in late evening and consider raising head of bed.  

## 2015-09-14 NOTE — Progress Notes (Signed)
Subjective:    Patient ID: Jackie Stevens, female    DOB: 04/30/1946, 69 y.o.   MRN: [id number]  Chief Complaint  Patient presents with  . Follow-up    HPI Patient is in today for follow-up. Overall is doing well but has been struggling with some intermittent lower abdominal pain. She is working with OB/GYN and hopefully bariatric surgery so that they may remove an adnexal mass/enlarged ovary and repair an abdominal hernia at the same time. She is anxious to proceed. She denies any bloody or tarry stool. No hematuria or dysuria. No fevers or chills. She is using her CPAP regularly. Is following with dermatology Dr. Dub Mikes. Denies CP/palp/SOB/HA/congestion/fevers/GI or GU c/o. Taking meds as prescribed  Past Medical History  Diagnosis Date  . Allergic rhinitis   . GERD (gastroesophageal reflux disease)   . Hyperlipidemia   . Chicken pox as a child  . Measles as a child  . Insomnia   . Thyroid disease   . Constipation   . Diverticulitis 10/25/2012    pt. reports that a drain was placed - 09/2012    . Abnormal cervical cytology 10/25/2012    Follows with Dr Leavy Cella of Gyn  . Asthma   . Heart murmur   . Complication of anesthesia   . PONV (postoperative nausea and vomiting)   . HTN (hypertension)     stress test completed by Anselm Lis, diagnosed as GERD  . Thyroid cancer ([place]) 1980's  . Hypothyroidism   . H/O hiatal hernia   . External hemorrhoid, bleeding     "sometimes" (09-Jan-2013)  . Arthritis     "knees; right thumb; shoulders" (09-Jan-2013)  . Kidney stones 1970's    "passed on their own" (09-Jan-2013)  . OSA on CPAP   . UTI (urinary tract infection) 04/02/2013  . Anemia 10/06/2013  . Preventative health care 10/06/2013    [facility] Pneumovax in 2012  . Low back pain 06/09/2014  . Medicare annual wellness visit, subsequent 10/06/2013    [facility] Pneumovax in 2012   . Paresthesia 03/23/2015    Left face    Past Surgical  History  Procedure Laterality Date  . Thyroidectomy, partial  1988    "then did iodine to remove the rest" (Jan 09, 2013)  . Sigmoid resection / rectopexy  09-Jan-2013  . Colon surgery    . Tonsillectomy  1951?  Marland Kitchen Cholecystectomy  1990  . Vaginal hysterectomy  1970's    "still have my ovaries" (2013/01/09)  . Dilation and curettage of uterus  1960's    "lots of them; had miscarriages" (Jan 09, 2013)  . Transrectal drainage of pelvic abscess  10/27/2012  . Colostomy revision  Jan 09, 2013    Procedure: COLON RESECTION SIGMOID;  Surgeon: Gwenyth Ober, MD;  Location: [facility];  Service: General;  Laterality: N/A;  . Cystoscopy with stent placement  Jan 09, 2013    Procedure: CYSTOSCOPY WITH STENT PLACEMENT;  Surgeon: Hanley Ben, MD;  Location: [facility];  Service: Urology;  Laterality: N/A;    Family History  Problem Relation Age of Onset  . Heart disease Father   . Pneumonia Father   . Hypertension Father   . Hyperlipidemia Father   . Cancer Father     skin  . Stroke Father   . Cancer Paternal Aunt   . Alzheimer's disease Mother   . Heart disease Mother   . Emphysema Brother     marijuana and cigarettes  . Alcohol abuse Brother   .  Hearing loss Brother   . Diabetes Maternal Grandmother   . Alzheimer's disease Paternal Grandmother   . Cancer Paternal Grandmother     lung?- smoker  . Hyperlipidemia Paternal Grandmother   . Heart attack Paternal Grandfather   . Alcohol abuse Paternal Grandfather   . Neurofibromatosis Son     schwanomatosis  . Neurofibromatosis Son     swanomatosis    Social History   Social History  . Marital Status: Married    Spouse Name: N/A  . Number of Children: N/A  . Years of Education: N/A   Occupational History  . Not on file.   Social History Main Topics  . Smoking status: Never Smoker   . Smokeless tobacco: Never Used  . Alcohol Use: 0.0 oz/week    0 Standard drinks or equivalent per week     Comment: 12/19/2012 "glass of wine q night q other month or  so"  . Drug Use: No  . Sexual Activity: No   Other Topics Concern  . Not on file   Social History Narrative   Married    children    Outpatient Prescriptions Prior to Visit  Medication Sig Dispense Refill  . albuterol (PROVENTIL HFA;VENTOLIN HFA) 108 (90 BASE) MCG/ACT inhaler Inhale 2 puffs into the lungs every 6 (six) hours as needed for wheezing. 1 Inhaler 2  . aspirin EC 81 MG tablet Take 81 mg by mouth at bedtime.    . cetirizine (ZYRTEC) 10 MG tablet Take 1 tablet (10 mg total) by mouth daily. 30 tablet 11  . clobetasol cream (TEMOVATE) 8.52 % Apply 1 application topically 2 (two) times daily.   0  . fluticasone (FLONASE) 50 MCG/ACT nasal spray Place 2 sprays into both nostrils daily. 16 g 2  . hydrochlorothiazide (HYDRODIURIL) 25 MG tablet Take 1 tablet (25 mg total) by mouth daily. 90 tablet 2  . hydrOXYzine (ATARAX/VISTARIL) 10 MG tablet Take 1 tablet (10 mg total) by mouth every 8 (eight) hours as needed. (Patient taking differently: Take 10 mg by mouth every 8 (eight) hours as needed for itching. ) 15 tablet 0  . KRILL OIL PO Take 500 mg by mouth daily.     Marland Kitchen levothyroxine (SYNTHROID, LEVOTHROID) 150 MCG tablet Take 150-225 mcg by mouth daily. 1 tablet (150 mcg) every day except Sunday, 1.5 tablet (225 mcg) on Sunday    . naproxen (NAPROSYN) 375 MG tablet TAKE 1 TABLET (375 MG TOTAL) BY MOUTH 2 (TWO) TIMES DAILY WITH A MEAL. 180 tablet 1  . NEOMYCIN-POLYMYXIN-HYDROCORTISONE (CORTISPORIN) 1 % SOLN otic solution Place 3 drops into both ears 2 (two) times daily as needed. For 5 days. (Patient not taking: Reported on 09/14/2015) 10 mL 0  . NITROSTAT 0.4 MG SL tablet PLACE 1 TABLET UNDER THE TONGUE EVERY 5 MINUTES AS NEEDED FOR CHEST PAIN 25 tablet 0  . omeprazole (PRILOSEC) 20 MG capsule TAKE 1 CAPSULE EVERY MORNING AT BREAFAST 90 capsule 2  . Polyethyl Glycol-Propyl Glycol 0.4-0.3 % GEL Apply 1 drop to eye at bedtime.    . Probiotic Product (PROBIOTIC ADVANCED PO) Take 2 tablets by  mouth daily. Digestive Advantage    . psyllium (REGULOID) 0.52 G capsule Take 0.52 g by mouth at bedtime.    . ranitidine (ZANTAC) 150 MG tablet TAKE 1 TABLET BY MOUTH AT BEDTIME 90 tablet 0  . senna (SENOKOT) 8.6 MG TABS tablet Take 1 tablet by mouth daily as needed for mild constipation.    . simvastatin (ZOCOR) 40  MG tablet TAKE 1 TABLET (40 MG TOTAL) BY MOUTH AT BEDTIME. 90 tablet 2  . sodium chloride (OCEAN) 0.65 % SOLN nasal spray Place 1 spray into both nostrils at bedtime.    . traMADol (ULTRAM) 50 MG tablet Take 1 tablet (50 mg total) by mouth every 6 (six) hours as needed. (Patient taking differently: Take 50 mg by mouth every 6 (six) hours as needed for moderate pain. ) 90 tablet 0  . TURMERIC PO Take 1 tablet by mouth daily.     . valsartan (DIOVAN) 320 MG tablet Take 1 tablet (320 mg total) by mouth daily. 90 tablet 2  . vitamin B-12 (CYANOCOBALAMIN) 1000 MCG tablet Take 2,500 mcg by mouth daily.    . Vitamin D, Cholecalciferol, 1000 UNITS CAPS Take 1 capsule by mouth daily.    Marland Kitchen doxycycline (VIBRA-TABS) 100 MG tablet Take 1 tablet (100 mg total) by mouth 2 (two) times daily. 20 tablet 0  . predniSONE (DELTASONE) 20 MG tablet 1 tab po bid x 5 days 10 tablet 0   No facility-administered medications prior to visit.    Allergies  Allergen Reactions  . Niacin Other (See Comments) and Cough    "cough til I threw up" (12/19/2012)  . Neomycin-Bacitracin Zn-Polymyx Rash    Polysporin- is tolerated   . Ciprofloxacin Hives    Got cipro and flagyl at same time, localized hives to IV arm  . Flagyl [Metronidazole] Hives    Got cipro and flagyl at same time, localized hives to IV arm    Review of Systems  Constitutional: Negative for fever and malaise/fatigue.  HENT: Negative for congestion.   Eyes: Negative for discharge.  Respiratory: Negative for shortness of breath.   Cardiovascular: Negative for chest pain, palpitations and leg swelling.  Gastrointestinal: Positive for abdominal  pain. Negative for nausea and blood in stool.  Genitourinary: Negative for dysuria and hematuria.  Musculoskeletal: Negative for falls.  Skin: Negative for rash.  Neurological: Negative for loss of consciousness and headaches.  Endo/Heme/Allergies: Negative for environmental allergies.  Psychiatric/Behavioral: Negative for depression. The patient is not nervous/anxious.        Objective:    Physical Exam  Constitutional: She is oriented to person, place, and time. She appears well-developed and well-nourished. No distress.  HENT:  Head: Normocephalic and atraumatic.  Nose: Nose normal.  Eyes: Right eye exhibits no discharge. Left eye exhibits no discharge.  Neck: Normal range of motion. Neck supple.  Cardiovascular: Normal rate and regular rhythm.   No murmur heard. Pulmonary/Chest: Effort normal and breath sounds normal.  Abdominal: Soft. Bowel sounds are normal. There is no tenderness.  Musculoskeletal: She exhibits no edema.  Neurological: She is alert and oriented to person, place, and time.  Skin: Skin is warm and dry.  Psychiatric: She has a normal mood and affect.  Nursing note and vitals reviewed.   BP 132/60 mmHg  Pulse 104  Temp(Src) 97.9 F (36.6 C) (Oral)  Ht 5' 2"  (1.575 m)  Wt 209 lb 6 oz (94.972 kg)  BMI 38.29 kg/m2  SpO2 95% Wt Readings from Last 3 Encounters:  09/09/15 209 lb 6 oz (94.972 kg)  09/05/15 209 lb 9.6 oz (95.074 kg)  09/01/15 209 lb (94.802 kg)     Lab Results  Component Value Date   WBC 20.6* 09/14/2015   HGB 11.6* 09/14/2015   HCT 35.5* 09/14/2015   PLT 281 09/14/2015   GLUCOSE 122* 09/14/2015   CHOL 144 11/01/2014   TRIG 143.0  11/01/2014   HDL 44.30 11/01/2014   LDLCALC 71 11/01/2014   ALT 22 09/14/2015   AST 20 09/14/2015   NA 135 09/14/2015   K 3.8 09/14/2015   CL 100* 09/14/2015   CREATININE 0.73 09/14/2015   BUN 14 09/14/2015   CO2 27 09/14/2015   TSH 0.14* 09/09/2015   INR 0.99 03/16/2015   HGBA1C 5.8* 07/18/2011      Lab Results  Component Value Date   TSH 0.14* 09/09/2015   Lab Results  Component Value Date   WBC 20.6* 09/14/2015   HGB 11.6* 09/14/2015   HCT 35.5* 09/14/2015   MCV 87.0 09/14/2015   PLT 281 09/14/2015   Lab Results  Component Value Date   NA 135 09/14/2015   K 3.8 09/14/2015   CO2 27 09/14/2015   GLUCOSE 122* 09/14/2015   BUN 14 09/14/2015   CREATININE 0.73 09/14/2015   BILITOT 0.8 09/14/2015   ALKPHOS 82 09/14/2015   AST 20 09/14/2015   ALT 22 09/14/2015   PROT 6.0* 09/14/2015   ALBUMIN 3.2* 09/14/2015   CALCIUM 8.5* 09/14/2015   ANIONGAP 8 09/14/2015   GFR 78.52 09/09/2015   Lab Results  Component Value Date   CHOL 144 11/01/2014   Lab Results  Component Value Date   HDL 44.30 11/01/2014   Lab Results  Component Value Date   LDLCALC 71 11/01/2014   Lab Results  Component Value Date   TRIG 143.0 11/01/2014   Lab Results  Component Value Date   CHOLHDL 3 11/01/2014   Lab Results  Component Value Date   HGBA1C 5.8* 07/18/2011       Assessment & Plan:   Problem List Items Addressed This Visit    Thyroid disease    tsh suppressed, follows with endocrinology but should consider skipping one dose of Levothyroxine weekly      Relevant Orders   TSH   CBC   Vit D  25 hydroxy (rtn osteoporosis monitoring)   Vitamin B12   Lipid panel   Comprehensive metabolic panel   Obstructive sleep apnea    Using CPAP daily      Hyperlipidemia, mixed    Encouraged heart healthy diet, increase exercise, avoid trans fats, consider a krill oil cap daily      Relevant Orders   TSH   CBC   Vit D  25 hydroxy (rtn osteoporosis monitoring)   Vitamin B12   Lipid panel   Comprehensive metabolic panel   G E R D    Avoid offending foods, start probiotics. Do not eat large meals in late evening and consider raising head of bed.       Essential hypertension    Well controlled, no changes to meds. Encouraged heart healthy diet such as the DASH diet and  exercise as tolerated.       Relevant Orders   Comp Met (CMET) (Completed)   TSH   CBC   Vit D  25 hydroxy (rtn osteoporosis monitoring)   Vitamin B12   Lipid panel   Comprehensive metabolic panel   Anemia   Relevant Orders   TSH   CBC   Vit D  25 hydroxy (rtn osteoporosis monitoring)   Vitamin B12   Lipid panel   Comprehensive metabolic panel    Other Visit Diagnoses    Hypothyroidism, unspecified hypothyroidism type    -  Primary    Relevant Orders    TSH (Completed)    TSH    CBC  Vit D  25 hydroxy (rtn osteoporosis monitoring)    Vitamin B12    Lipid panel    Comprehensive metabolic panel    Vitamin D deficiency        Relevant Orders    Vit D  25 hydroxy (rtn osteoporosis monitoring)       I have discontinued Ms. Folmar's predniSONE. I am also having her start on valACYclovir. Additionally, I am having her maintain her levothyroxine, psyllium, albuterol, cetirizine, fluticasone, NITROSTAT, KRILL OIL PO, TURMERIC PO, NEOMYCIN-POLYMYXIN-HYDROCORTISONE, vitamin B-12, aspirin EC, Polyethyl Glycol-Propyl Glycol, sodium chloride, hydrochlorothiazide, valsartan, simvastatin, omeprazole, ranitidine, traMADol, naproxen, senna, Probiotic Product (PROBIOTIC ADVANCED PO), hydrOXYzine, clobetasol cream, and Vitamin D (Cholecalciferol).  Meds ordered this encounter  Medications  . valACYclovir (VALTREX) 1000 MG tablet    Sig: Take 1 tablet (1,000 mg total) by mouth 3 (three) times daily.    Dispense:  21 tablet    Refill:  0     Penni Homans, MD

## 2015-09-15 DIAGNOSIS — K529 Noninfective gastroenteritis and colitis, unspecified: Principal | ICD-10-CM

## 2015-09-15 DIAGNOSIS — A419 Sepsis, unspecified organism: Secondary | ICD-10-CM

## 2015-09-15 DIAGNOSIS — J4520 Mild intermittent asthma, uncomplicated: Secondary | ICD-10-CM

## 2015-09-15 DIAGNOSIS — E782 Mixed hyperlipidemia: Secondary | ICD-10-CM

## 2015-09-15 DIAGNOSIS — K219 Gastro-esophageal reflux disease without esophagitis: Secondary | ICD-10-CM

## 2015-09-15 DIAGNOSIS — G4733 Obstructive sleep apnea (adult) (pediatric): Secondary | ICD-10-CM

## 2015-09-15 DIAGNOSIS — D649 Anemia, unspecified: Secondary | ICD-10-CM

## 2015-09-15 DIAGNOSIS — I10 Essential (primary) hypertension: Secondary | ICD-10-CM

## 2015-09-15 LAB — BASIC METABOLIC PANEL
Anion gap: 7 (ref 5–15)
BUN: 7 mg/dL (ref 6–20)
CO2: 26 mmol/L (ref 22–32)
Calcium: 7.8 mg/dL — ABNORMAL LOW (ref 8.9–10.3)
Chloride: 104 mmol/L (ref 101–111)
Creatinine, Ser: 0.67 mg/dL (ref 0.44–1.00)
GFR calc Af Amer: >60 mL/min (ref >60)
GFR calc non Af Amer: >60 mL/min (ref >60)
Glucose, Bld: 102 mg/dL — ABNORMAL HIGH (ref 65–99)
Potassium: 3.6 mmol/L (ref 3.5–5.1)
Sodium: 137 mmol/L (ref 135–145)

## 2015-09-15 LAB — URINALYSIS, ROUTINE W REFLEX MICROSCOPIC
Bilirubin Urine: NEGATIVE
Glucose, UA: NEGATIVE mg/dL
Ketones, ur: NEGATIVE mg/dL
Nitrite: NEGATIVE
Protein, ur: NEGATIVE mg/dL
Specific Gravity, Urine: 1.009 (ref 1.005–1.030)
Urobilinogen, UA: 0.2 mg/dL (ref 0.0–1.0)
pH: 5.5 (ref 5.0–8.0)

## 2015-09-15 LAB — FERRITIN: Ferritin: 52 ng/mL (ref 11–307)

## 2015-09-15 LAB — CBC
HCT: 30.6 % — ABNORMAL LOW (ref 36.0–46.0)
Hemoglobin: 10.1 g/dL — ABNORMAL LOW (ref 12.0–15.0)
MCH: 29.2 pg (ref 26.0–34.0)
MCHC: 33 g/dL (ref 30.0–36.0)
MCV: 88.4 fL (ref 78.0–100.0)
Platelets: 225 10*3/uL (ref 150–400)
RBC: 3.46 MIL/uL — ABNORMAL LOW (ref 3.87–5.11)
RDW: 14.6 % (ref 11.5–15.5)
WBC: 11.1 10*3/uL — ABNORMAL HIGH (ref 4.0–10.5)

## 2015-09-15 LAB — IRON AND TIBC
Iron: 33 ug/dL (ref 28–170)
Saturation Ratios: 13 % (ref 10.4–31.8)
TIBC: 249 ug/dL — ABNORMAL LOW (ref 250–450)
UIBC: 216 ug/dL

## 2015-09-15 LAB — URINE MICROSCOPIC-ADD ON

## 2015-09-15 LAB — FOLATE: Folate: 22 ng/mL (ref >5.9)

## 2015-09-15 LAB — VITAMIN B12: Vitamin B-12: 1872 pg/mL — ABNORMAL HIGH (ref 180–914)

## 2015-09-15 MED ORDER — SODIUM CHLORIDE 0.9 % IV SOLN
INTRAVENOUS | Status: DC
  Administered 2015-09-15 – 2015-09-16 (×3): via INTRAVENOUS

## 2015-09-15 NOTE — Progress Notes (Signed)
Utilization review completed. Shatara Stanek, RN, BSN. 

## 2015-09-15 NOTE — Progress Notes (Signed)
[facility]                                                                              Patient Demographics  Jackie Stevens, is a 69 y.o. female, DOB - [birth year], NTI:[id number]  Admit date - 09/14/2015   Admitting Physician Edwin Dada, MD  Outpatient Primary MD for the patient is Penni Homans, MD  LOS - 1   Chief Complaint  Patient presents with  . Rectal Bleeding       Brief HPI  Jackie Stevens is a 68 y.o. 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 The patient was in her usual state of health until the night before admission when she had an episode of moderate to severe left lower quadrant pain near her hernia, accompanied by sweats, vomiting several times, subjective fevers, and then several loose bowel movements, which were mixed with bright red blood. This morning she felt better until midday when she had more cramps and rectal bleeding. In the ED, the patient had low grade fever, tachycardia, and leukocytosis. There was frank blood on rectal exam. CT of the abdomen and pelvis with contrast showed an area of transverse colon with thickening and inflammatory change adjacent to bowel within the hernia, without radiographic findings of strangulation or diverticulitis. The patient was given ciprofloxacin and metronidazole and developed hives near her IV site.  Patient was admitted for further workup.    Assessment & Plan    Principal Problem:   Colitis - CT abdomen showed area of transverse colon with thickening and inflammatory change adjacent to bowel within the hernia, no strength lesion or obstruction - Gen. surgery was consulted and recommended no surgical intervention - Continue clear liquid diet, IV Zosyn, IV fluids - Follow C. difficile  PCR  Active Problems: UTI urinary tract infection Follow urine culture and sensitivities, continue IV Zosyn     Hyperlipidemia, mixed  - Continue statin    Essential hypertension - Currently stable, continue HCTZ, avapro    G E R D - placed on PPI    Thyroid disease -Continue Synthroid, obtain TSH     Asthma, mild intermittent - Currently stable, no wheezing  Code Status: Full code   Family Communication: Discussed in detail with the patient, all imaging results, lab results explained to the patient    Disposition Plan: transfer to tele  Time Spent in minutes 41minutes  Procedures  CT abd  Consults   surgery  DVT Prophylaxis scd Medications  Scheduled Meds: . hydrochlorothiazide  25 mg Oral Daily  . irbesartan  300 mg Oral Daily  . levothyroxine  150 mcg Oral Once per day on Mon Tue Wed Thu Fri Sat  . [START ON 09/21/2015] levothyroxine  225 mcg Oral Once per day on Sun  . piperacillin-tazobactam (ZOSYN)  IV  3.375 g Intravenous 3 times per day  . simvastatin  40 mg Oral q1800  . valACYclovir  1,000 mg Oral TID   Continuous  Infusions: . sodium chloride 100 mL/hr at 09/15/15 0839   PRN Meds:.ondansetron **OR** ondansetron (ZOFRAN) IV, traMADol   Antibiotics   Anti-infectives    Start     Dose/Rate Route Frequency Ordered Stop   09/15/15 0600  piperacillin-tazobactam (ZOSYN) IVPB 3.375 g     3.375 g 12.5 mL/hr over 240 Minutes Intravenous 3 times per day 09/14/15 2133     09/14/15 2200  valACYclovir (VALTREX) tablet 1,000 mg     1,000 mg Oral 3 times daily 09/14/15 2046 09/16/15 0959   09/14/15 2130  piperacillin-tazobactam (ZOSYN) IVPB 3.375 g     3.375 g 100 mL/hr over 30 Minutes Intravenous  Once 09/14/15 2046 09/14/15 2243   09/14/15 1845  ciprofloxacin (CIPRO) IVPB 400 mg     400 mg 200 mL/hr over 60 Minutes Intravenous  Once 09/14/15 1836 09/14/15 2010   09/14/15 1845  metroNIDAZOLE (FLAGYL) IVPB 500 mg     500 mg 100 mL/hr over 60 Minutes  Intravenous  Once 09/14/15 1836 09/14/15 2019        Subjective:   Jackie Stevens was seen and examined today.  Feeling a lot better today, denies any nausea, vomiting, abdominal pain is improving no fevers or chills. Patient denies dizziness, chest pain, shortness of breath, new weakness, numbess, tingling. No acute events overnight.  No BM since admission  Objective:   Blood pressure 131/62, pulse 85, temperature 98.8 F (37.1 C), temperature source Oral, resp. rate 14, height 5\' 2"  (1.575 m), weight 92.8 kg (204 lb 9.4 oz), SpO2 97 %.  Wt Readings from Last 3 Encounters:  09/14/15 92.8 kg (204 lb 9.4 oz)  09/09/15 94.972 kg (209 lb 6 oz)  09/05/15 95.074 kg (209 lb 9.6 oz)     Intake/Output Summary (Last 24 hours) at 09/15/15 1338 Last data filed at 09/15/15 1259  Gross per 24 hour  Intake   2955 ml  Output   2450 ml  Net    505 ml    Exam  General: Alert and oriented x 3, NAD  HEENT:  PERRLA, EOMI, Anicteric Sclera, mucous membranes moist.   Neck: Supple, no JVD, no masses  CVS: S1 S2 auscultated, no rubs, murmurs or gallops. Regular rate and rhythm.  Respiratory: Clear to auscultation bilaterally, no wheezing, rales or rhonchi  Abdomen: Soft,  mild diffuse tenderness on deep palpation , nondistended, + bowel sounds  Ext: no cyanosis clubbing or edema  Neuro: AAOx3, Cr N's II- XII. Strength 5/5 upper and lower extremities bilaterally  Skin: No rashes  Psych: Normal affect and demeanor, alert and oriented x3    Data Review   Micro Results Recent Results (from the past 240 hour(s))  MRSA PCR Screening     Status: None   Collection Time: 09/14/15  9:40 PM  Result Value Ref Range Status   MRSA by PCR NEGATIVE NEGATIVE Final    Comment:        The GeneXpert MRSA Assay (FDA approved for NASAL specimens only), is one component of a comprehensive MRSA colonization surveillance program. It is not intended to diagnose MRSA infection nor to guide or monitor  treatment for MRSA infections.     Radiology Reports Ct Abdomen Pelvis W Contrast  09/14/2015   CLINICAL DATA:  Left lower quadrant pain and rectal bleeding with 3 episodes starting last night, history of diverticulitis  EXAM: CT ABDOMEN AND PELVIS WITH CONTRAST  TECHNIQUE: Multidetector CT imaging of the abdomen and pelvis was performed using the standard protocol  following bolus administration of intravenous contrast.  CONTRAST:  146mL OMNIPAQUE IOHEXOL 300 MG/ML  SOLN  COMPARISON:  08/14/2015  FINDINGS: Lower chest: Mild infiltrate in the posterior medial left lower lobe which may represent atelectasis. Pneumonia or pneumonitis not excluded.  Hepatobiliary: Status post cholecystectomy. Mild presumably related biliary dilatation  Pancreas: Neck  Spleen: Negative  Adrenals/Urinary Tract: Normal except at the bladder is compressed by mass effect from large cystic right adnexal mass  Stomach/Bowel: There is an anterior abdominal wall hernia in the right periumbilical region, with hernia sac measuring 10 cm. Loops of both small and large bowel are contained within this. There is also a left periumbilical hernia measuring about 10 cm. This contains large bowel. There is a nonobstructive bowel gas pattern. Stomach is decompressed. Small bowel is normal. Appendix is normal. The distal large bowel shows an area of anastomosis. New  Proximal transverse colon enters and exits right abdominal wall hernia. Immediately subsequent section of transverse colon enters and exits left periumbilical hernia. As the large bowel Re enters the abdominal cavity beyond the second hernia, there is moderate wall thickening of the distal transverse colon, with wall thickening possibly involving the distal portion of the large bowel contained within the hernia as well. Moderate to severe large bowel wall thickening extends to the splenic flexure with mild large bowel wall thickening involving the proximal descending colon. There is mild  surrounding inflammatory change. There is no pneumatosis. There is a 1.5 cm fat containing structure arising off of the splenic flexure image number 47 and 48 consistent with inflamed epiploic appendage.  Vascular/Lymphatic: No evidence of aneurysm. No significant adenopathy.  Reproductive: Known right cystic adnexal mass again identified unchanged at about 9 cm.  Other: No ascites  Musculoskeletal: No acute findings  IMPRESSION: 1. Right and left periumbilical abdominal wall hernias. Transverse colon is involved with both hernias with no evidence of obstruction. 2. Moderate inflammatory change involving the distal third of the transverse colon to the splenic flexure with mild inflammation involving proximal descending colon as well. The distal portion of large bowel contained within the second hernia sac may also be inflamed. Although large bowel is involved with abdominal wall hernias, and although there is evidence of a single inflamed epiploic appendage, the appearance is not particularly consistent with either strangulation or epiploic appendagitis. Essentially there is nonspecific moderately prominent inflammation of the large bowel in the portion described above consistent with nonspecific colitis. 3. Known cystic right adnexal mass. As previously described in prior report, pelvic MRI recommended as malignancy is not excluded. 4. Mild infiltrate medial left lower lobe. Pneumonitis not excluded. This could represent atelectasis.   Electronically Signed   By: Skipper Cliche M.D.   On: 09/14/2015 18:27    CBC  Recent Labs Lab 09/14/15 1522 09/15/15 0618  WBC 20.6* 11.1*  HGB 11.6* 10.1*  HCT 35.5* 30.6*  PLT 281 225  MCV 87.0 88.4  MCH 28.4 29.2  MCHC 32.7 33.0  RDW 14.1 14.6  LYMPHSABS 1.4  --   MONOABS 0.9  --   EOSABS 0.0  --   BASOSABS 0.0  --     Chemistries   Recent Labs Lab 09/09/15 1425 09/14/15 1522 09/15/15 0618  NA 135 135 137  K 4.3 3.8 3.6  CL 97 100* 104  CO2 33*  27 26  GLUCOSE 89 122* 102*  BUN 21 14 7   CREATININE 0.77 0.73 0.67  CALCIUM 8.9 8.5* 7.8*  AST 14 20  --  ALT 16 22  --   ALKPHOS 88 82  --   BILITOT 0.4 0.8  --    ------------------------------------------------------------------------------------------------------------------ estimated creatinine clearance is 68.4 mL/min (by C-G formula based on Cr of 0.67). ------------------------------------------------------------------------------------------------------------------ No results for input(s): HGBA1C in the last 72 hours. ------------------------------------------------------------------------------------------------------------------ No results for input(s): CHOL, HDL, LDLCALC, TRIG, CHOLHDL, LDLDIRECT in the last 72 hours. ------------------------------------------------------------------------------------------------------------------ No results for input(s): TSH, T4TOTAL, T3FREE, THYROIDAB in the last 72 hours.  Invalid input(s): FREET3 ------------------------------------------------------------------------------------------------------------------  Recent Labs  09/15/15 0618  VITAMINB12 1872*  FOLATE 22.0  FERRITIN 52  TIBC 249*  IRON 33    Coagulation profile No results for input(s): INR, PROTIME in the last 168 hours.  No results for input(s): DDIMER in the last 72 hours.  Cardiac Enzymes No results for input(s): CKMB, TROPONINI, MYOGLOBIN in the last 168 hours.  Invalid input(s): CK ------------------------------------------------------------------------------------------------------------------ Invalid input(s): POCBNP  No results for input(s): GLUCAP in the last 72 hours.   Jackie Stevens M.D. [facility] 09/15/2015, 1:38 PM  Pager: [phone] Between 7am to 7pm - call Pager - [phone]  After 7pm go to [email or web address] - password [email or web address]  Call night coverage person covering after 7pm

## 2015-09-15 NOTE — Progress Notes (Signed)
Subjective: Pt denies abdominal pain  No nausea or vomiting no more bloody BM no diarrhea last BM yesterday  Objective: Vital signs in last 24 hours: Temp:  [98 F (36.7 C)-99.1 F (37.3 C)] 98.8 F (37.1 C) (10/03 0754) Pulse Rate:  [79-102] 85 (10/03 0754) Resp:  [10-18] 14 (10/03 0754) BP: (101-169)/(51-75) 131/62 mmHg (10/03 0754) SpO2:  [92 %-100 %] 97 % (10/03 0754) Weight:  [92.8 kg (204 lb 9.4 oz)] 92.8 kg (204 lb 9.4 oz) (10/02 2045) Last BM Date: 09/14/15  Intake/Output from previous day: 10/02 0701 - 10/03 0700 In: 2580 [P.O.:480; IV Piggyback:2100] Out: 1600 [Urine:1600] Intake/Output this shift:    GI: large incisional hernia soft mostly reducible  NT ND abdomen   Lab Results:   Recent Labs  09/14/15 1522 09/15/15 0618  WBC 20.6* 11.1*  HGB 11.6* 10.1*  HCT 35.5* 30.6*  PLT 281 225   BMET  Recent Labs  09/14/15 1522 09/15/15 0618  NA 135 137  K 3.8 3.6  CL 100* 104  CO2 27 26  GLUCOSE 122* 102*  BUN 14 7  CREATININE 0.73 0.67  CALCIUM 8.5* 7.8*   PT/INR No results for input(s): LABPROT, INR in the last 72 hours. ABG No results for input(s): PHART, HCO3 in the last 72 hours.  Invalid input(s): PCO2, PO2  Studies/Results: Ct Abdomen Pelvis W Contrast  09/14/2015   CLINICAL DATA:  Left lower quadrant pain and rectal bleeding with 3 episodes starting last night, history of diverticulitis  EXAM: CT ABDOMEN AND PELVIS WITH CONTRAST  TECHNIQUE: Multidetector CT imaging of the abdomen and pelvis was performed using the standard protocol following bolus administration of intravenous contrast.  CONTRAST:  141mL OMNIPAQUE IOHEXOL 300 MG/ML  SOLN  COMPARISON:  08/14/2015  FINDINGS: Lower chest: Mild infiltrate in the posterior medial left lower lobe which may represent atelectasis. Pneumonia or pneumonitis not excluded.  Hepatobiliary: Status post cholecystectomy. Mild presumably related biliary dilatation  Pancreas: Neck  Spleen: Negative   Adrenals/Urinary Tract: Normal except at the bladder is compressed by mass effect from large cystic right adnexal mass  Stomach/Bowel: There is an anterior abdominal wall hernia in the right periumbilical region, with hernia sac measuring 10 cm. Loops of both small and large bowel are contained within this. There is also a left periumbilical hernia measuring about 10 cm. This contains large bowel. There is a nonobstructive bowel gas pattern. Stomach is decompressed. Small bowel is normal. Appendix is normal. The distal large bowel shows an area of anastomosis. New  Proximal transverse colon enters and exits right abdominal wall hernia. Immediately subsequent section of transverse colon enters and exits left periumbilical hernia. As the large bowel Re enters the abdominal cavity beyond the second hernia, there is moderate wall thickening of the distal transverse colon, with wall thickening possibly involving the distal portion of the large bowel contained within the hernia as well. Moderate to severe large bowel wall thickening extends to the splenic flexure with mild large bowel wall thickening involving the proximal descending colon. There is mild surrounding inflammatory change. There is no pneumatosis. There is a 1.5 cm fat containing structure arising off of the splenic flexure image number 47 and 48 consistent with inflamed epiploic appendage.  Vascular/Lymphatic: No evidence of aneurysm. No significant adenopathy.  Reproductive: Known right cystic adnexal mass again identified unchanged at about 9 cm.  Other: No ascites  Musculoskeletal: No acute findings  IMPRESSION: 1. Right and left periumbilical abdominal wall hernias. Transverse colon is involved  with both hernias with no evidence of obstruction. 2. Moderate inflammatory change involving the distal third of the transverse colon to the splenic flexure with mild inflammation involving proximal descending colon as well. The distal portion of large bowel  contained within the second hernia sac may also be inflamed. Although large bowel is involved with abdominal wall hernias, and although there is evidence of a single inflamed epiploic appendage, the appearance is not particularly consistent with either strangulation or epiploic appendagitis. Essentially there is nonspecific moderately prominent inflammation of the large bowel in the portion described above consistent with nonspecific colitis. 3. Known cystic right adnexal mass. As previously described in prior report, pelvic MRI recommended as malignancy is not excluded. 4. Mild infiltrate medial left lower lobe. Pneumonitis not excluded. This could represent atelectasis.   Electronically Signed   By: Skipper Cliche M.D.   On: 09/14/2015 18:27    Anti-infectives: Anti-infectives    Start     Dose/Rate Route Frequency Ordered Stop   09/15/15 0600  piperacillin-tazobactam (ZOSYN) IVPB 3.375 g     3.375 g 12.5 mL/hr over 240 Minutes Intravenous 3 times per day 09/14/15 2133     09/14/15 2200  valACYclovir (VALTREX) tablet 1,000 mg     1,000 mg Oral 3 times daily 09/14/15 2046 09/16/15 0959   09/14/15 2130  piperacillin-tazobactam (ZOSYN) IVPB 3.375 g     3.375 g 100 mL/hr over 30 Minutes Intravenous  Once 09/14/15 2046 09/14/15 2243   09/14/15 1845  ciprofloxacin (CIPRO) IVPB 400 mg     400 mg 200 mL/hr over 60 Minutes Intravenous  Once 09/14/15 1836 09/14/15 2010   09/14/15 1845  metroNIDAZOLE (FLAGYL) IVPB 500 mg     500 mg 100 mL/hr over 60 Minutes Intravenous  Once 09/14/15 1836 09/14/15 2019      Assessment/Plan: Incisional hernia  Pelvic mass Blood in stool  Bleeding seems minimal and this is not likely from her hernia Denies abdominal pain and bowels moving /  Hernia reducible partially  Can advance diet If bleeding persists recommend GI consult She is set up to see  Dr Hassell Done for hernia repair in a couple of weeks and this is to be coordinated with her GYN Will sign off at this  point    LOS: 1 day    Elika Godar A. 09/15/2015

## 2015-09-15 NOTE — Consult Note (Signed)
Reason for Consult: Rectal bleeding. Referring Physician: THP  Jackie Stevens is an 69 y.o. female.  HPI: Jackie Stevens is a pleasant, 69 year old, morbidly obese, white female with a known incisional hernia who was admitted with hematochezia. She had some LLQ pain a couple of days ago prior to admission with several episodes of vomiting. She has a history of chronic constipation and after taking several laxatives she started having diarrhea with blood on the toilet tissue. She had a CT scan of the abdomen and pelvis that revealed an incisional hernia containing transverse colon; there was evidence of colitis in colon distal to the hernia without any obstruction. She denies having any rectal bleeding or abdominal pain at this time. 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 Hulen Skains and Dr. Janice Norrie.She also has a 8 cm ovarian cyst [described as a cystic right  adnexal mass on CT]  which is being followed by Dr. Denman George. She has not had a BM today. Her last colonoscopy was done in January 2015 when she was found to have a tight stricture at 7 cm [thought to be due to complication from diverticulitis] along with external and internal hemorrhoids.      Past Surgical History  Procedure Laterality Date  . Thyroidectomy, partial  1988    "then did iodine to remove the rest" (12/19/2012)  . Sigmoid resection / rectopexy  12/19/2012  . Colon surgery    . Tonsillectomy  1951?  Marland Kitchen Cholecystectomy  1990  . Vaginal hysterectomy  1970's    "still have my ovaries" (12/19/2012)  . Dilation and curettage of uterus  1960's    "lots of them; had miscarriages" (12/19/2012)  . Transrectal drainage of pelvic abscess  10/27/2012  . Colostomy revision  12/19/2012    Procedure: COLON RESECTION SIGMOID;  Surgeon: Gwenyth Ober, MD;  Location: [facility];  Service: General;  Laterality: N/A;  . Cystoscopy with stent placement  12/19/2012    Procedure: CYSTOSCOPY WITH STENT PLACEMENT;  Surgeon:  Hanley Ben, MD;  Location: [place];  Service: Urology;  Laterality: N/A;   Family History  Problem Relation Age of Onset  . Heart disease Father   . Pneumonia Father   . Hypertension Father   . Hyperlipidemia Father   . Cancer Father     skin  . Stroke Father   . Cancer Paternal Aunt   . Alzheimer's disease Mother   . Heart disease Mother   . Emphysema Brother     marijuana and cigarettes  . Alcohol abuse Brother   . Hearing loss Brother   . Diabetes Maternal Grandmother   . Alzheimer's disease Paternal Grandmother   . Cancer Paternal Grandmother     lung?- smoker  . Hyperlipidemia Paternal Grandmother   . Heart attack Paternal Grandfather   . Alcohol abuse Paternal Grandfather   . Neurofibromatosis Son     schwanomatosis  . Neurofibromatosis Son     swanomatosis   Social History:  reports that she has never smoked. She has never used smokeless tobacco. She reports that she drinks alcohol. She reports that she does not use illicit drugs.  Allergies:  Allergies  Allergen Reactions  . Niacin Other (See Comments) and Cough    "cough til I threw up" (12/19/2012)  . Neomycin-Bacitracin Zn-Polymyx Rash    Polysporin- is tolerated   . Ciprofloxacin Hives    Got cipro and flagyl at same time, localized hives to  IV arm  . Flagyl [Metronidazole] Hives    Got cipro and flagyl at same time, localized hives to IV arm   Medications: I have reviewed the patient's current medications.  Results for orders placed or performed during the hospital encounter of 09/14/15 (from the past 48 hour(s))  CBC WITH DIFFERENTIAL     Status: Abnormal   Collection Time: 09/14/15  3:22 PM  Result Value Ref Range   WBC 20.6 (H) 4.0 - 10.5 K/uL   RBC 4.08 3.87 - 5.11 MIL/uL   Hemoglobin 11.6 (L) 12.0 - 15.0 g/dL   HCT 35.5 (L) 36.0 - 46.0 %   MCV 87.0 78.0 - 100.0 fL   MCH 28.4 26.0 - 34.0 pg   MCHC 32.7 30.0 - 36.0 g/dL   RDW 14.1 11.5 - 15.5 %   Platelets 281 150 - 400 K/uL   Neutrophils  Relative % 88 %   Neutro Abs 18.3 (H) 1.7 - 7.7 K/uL   Lymphocytes Relative 7 %   Lymphs Abs 1.4 0.7 - 4.0 K/uL   Monocytes Relative 5 %   Monocytes Absolute 0.9 0.1 - 1.0 K/uL   Eosinophils Relative 0 %   Eosinophils Absolute 0.0 0.0 - 0.7 K/uL   Basophils Relative 0 %   Basophils Absolute 0.0 0.0 - 0.1 K/uL  Comprehensive metabolic panel     Status: Abnormal   Collection Time: 09/14/15  3:22 PM  Result Value Ref Range   Sodium 135 135 - 145 mmol/L   Potassium 3.8 3.5 - 5.1 mmol/L   Chloride 100 (L) 101 - 111 mmol/L   CO2 27 22 - 32 mmol/L   Glucose, Bld 122 (H) 65 - 99 mg/dL   BUN 14 6 - 20 mg/dL   Creatinine, Ser 0.73 0.44 - 1.00 mg/dL   Calcium 8.5 (L) 8.9 - 10.3 mg/dL   Total Protein 6.0 (L) 6.5 - 8.1 g/dL   Albumin 3.2 (L) 3.5 - 5.0 g/dL   AST 20 15 - 41 U/L   ALT 22 14 - 54 U/L   Alkaline Phosphatase 82 38 - 126 U/L   Total Bilirubin 0.8 0.3 - 1.2 mg/dL   GFR calc non Af Amer >60 >60 mL/min   GFR calc Af Amer >60 >60 mL/min    Comment: (NOTE) The eGFR has been calculated using the CKD EPI equation. This calculation has not been validated in all clinical situations. eGFR's persistently <60 mL/min signify possible Chronic Kidney Disease.    Anion gap 8 5 - 15  Lipase, blood     Status: Abnormal   Collection Time: 09/14/15  3:22 PM  Result Value Ref Range   Lipase 12 (L) 22 - 51 U/L  I-Stat CG4 Lactic Acid, ED     Status: None   Collection Time: 09/14/15  4:22 PM  Result Value Ref Range   Lactic Acid, Venous 1.61 0.5 - 2.0 mmol/L  POC occult blood, ED     Status: Abnormal   Collection Time: 09/14/15  4:45 PM  Result Value Ref Range   Fecal Occult Bld POSITIVE (A) NEGATIVE  Urinalysis, Routine w reflex microscopic (not at [facility])     Status: Abnormal   Collection Time: 09/14/15  5:23 PM  Result Value Ref Range   Color, Urine YELLOW YELLOW   APPearance CLOUDY (A) CLEAR   Specific Gravity, Urine 1.013 1.005 - 1.030   pH 8.5 (H) 5.0 - 8.0   Glucose, UA NEGATIVE  NEGATIVE mg/dL   Hgb urine  dipstick SMALL (A) NEGATIVE   Bilirubin Urine NEGATIVE NEGATIVE   Ketones, ur NEGATIVE NEGATIVE mg/dL   Protein, ur 30 (A) NEGATIVE mg/dL   Urobilinogen, UA 0.2 0.0 - 1.0 mg/dL   Nitrite NEGATIVE NEGATIVE   Leukocytes, UA LARGE (A) NEGATIVE  Urine microscopic-add on     Status: Abnormal   Collection Time: 09/14/15  5:23 PM  Result Value Ref Range   Squamous Epithelial / LPF RARE RARE   WBC, UA 11-20 <3 WBC/hpf   RBC / HPF 0-2 <3 RBC/hpf   Bacteria, UA MANY (A) RARE  Lactic acid, plasma     Status: None   Collection Time: 09/14/15  9:12 PM  Result Value Ref Range   Lactic Acid, Venous 1.4 0.5 - 2.0 mmol/L  MRSA PCR Screening     Status: None   Collection Time: 09/14/15  9:40 PM  Result Value Ref Range   MRSA by PCR NEGATIVE NEGATIVE    Comment:        The GeneXpert MRSA Assay (FDA approved for NASAL specimens only), is one component of a comprehensive MRSA colonization surveillance program. It is not intended to diagnose MRSA infection nor to guide or monitor treatment for MRSA infections.   Basic metabolic panel     Status: Abnormal   Collection Time: 09/15/15  6:18 AM  Result Value Ref Range   Sodium 137 135 - 145 mmol/L   Potassium 3.6 3.5 - 5.1 mmol/L   Chloride 104 101 - 111 mmol/L   CO2 26 22 - 32 mmol/L   Glucose, Bld 102 (H) 65 - 99 mg/dL   BUN 7 6 - 20 mg/dL   Creatinine, Ser 0.67 0.44 - 1.00 mg/dL   Calcium 7.8 (L) 8.9 - 10.3 mg/dL   GFR calc non Af Amer >60 >60 mL/min   GFR calc Af Amer >60 >60 mL/min    Comment: (NOTE) The eGFR has been calculated using the CKD EPI equation. This calculation has not been validated in all clinical situations. eGFR's persistently <60 mL/min signify possible Chronic Kidney Disease.    Anion gap 7 5 - 15  CBC     Status: Abnormal   Collection Time: 09/15/15  6:18 AM  Result Value Ref Range   WBC 11.1 (H) 4.0 - 10.5 K/uL   RBC 3.46 (L) 3.87 - 5.11 MIL/uL   Hemoglobin 10.1 (L) 12.0 - 15.0 g/dL    HCT 30.6 (L) 36.0 - 46.0 %   MCV 88.4 78.0 - 100.0 fL   MCH 29.2 26.0 - 34.0 pg   MCHC 33.0 30.0 - 36.0 g/dL   RDW 14.6 11.5 - 15.5 %   Platelets 225 150 - 400 K/uL  Ferritin     Status: None   Collection Time: 09/15/15  6:18 AM  Result Value Ref Range   Ferritin 52 11 - 307 ng/mL  Folate     Status: None   Collection Time: 09/15/15  6:18 AM  Result Value Ref Range   Folate 22.0 >5.9 ng/mL  Iron and TIBC     Status: Abnormal   Collection Time: 09/15/15  6:18 AM  Result Value Ref Range   Iron 33 28 - 170 ug/dL   TIBC 249 (L) 250 - 450 ug/dL   Saturation Ratios 13 10.4 - 31.8 %   UIBC 216 ug/dL  Vitamin B12     Status: Abnormal   Collection Time: 09/15/15  6:18 AM  Result Value Ref Range   Vitamin B-12 1872 (H)  180 - 914 pg/mL    Comment: (NOTE) This assay is not validated for testing neonatal or myeloproliferative syndrome specimens for Vitamin B12 levels.   Urinalysis, Routine w reflex microscopic (not at [facility])     Status: Abnormal   Collection Time: 09/15/15  8:39 AM  Result Value Ref Range   Color, Urine YELLOW YELLOW   APPearance CLOUDY (A) CLEAR   Specific Gravity, Urine 1.009 1.005 - 1.030   pH 5.5 5.0 - 8.0   Glucose, UA NEGATIVE NEGATIVE mg/dL   Hgb urine dipstick TRACE (A) NEGATIVE   Bilirubin Urine NEGATIVE NEGATIVE   Ketones, ur NEGATIVE NEGATIVE mg/dL   Protein, ur NEGATIVE NEGATIVE mg/dL   Urobilinogen, UA 0.2 0.0 - 1.0 mg/dL   Nitrite NEGATIVE NEGATIVE   Leukocytes, UA LARGE (A) NEGATIVE  Urine microscopic-add on     Status: Abnormal   Collection Time: 09/15/15  8:39 AM  Result Value Ref Range   Squamous Epithelial / LPF MANY (A) RARE   WBC, UA TOO NUMEROUS TO COUNT <3 WBC/hpf   RBC / HPF 3-6 <3 RBC/hpf   Bacteria, UA RARE RARE   Ct Abdomen Pelvis W Contrast  09/14/2015   CLINICAL DATA:  Left lower quadrant pain and rectal bleeding with 3 episodes starting last night, history of diverticulitis  EXAM: CT ABDOMEN AND PELVIS WITH CONTRAST  TECHNIQUE:  Multidetector CT imaging of the abdomen and pelvis was performed using the standard protocol following bolus administration of intravenous contrast.  CONTRAST:  181m OMNIPAQUE IOHEXOL 300 MG/ML  SOLN  COMPARISON:  08/14/2015  FINDINGS: Lower chest: Mild infiltrate in the posterior medial left lower lobe which may represent atelectasis. Pneumonia or pneumonitis not excluded.  Hepatobiliary: Status post cholecystectomy. Mild presumably related biliary dilatation  Pancreas: Neck  Spleen: Negative  Adrenals/Urinary Tract: Normal except at the bladder is compressed by mass effect from large cystic right adnexal mass  Stomach/Bowel: There is an anterior abdominal wall hernia in the right periumbilical region, with hernia sac measuring 10 cm. Loops of both small and large bowel are contained within this. There is also a left periumbilical hernia measuring about 10 cm. This contains large bowel. There is a nonobstructive bowel gas pattern. Stomach is decompressed. Small bowel is normal. Appendix is normal. The distal large bowel shows an area of anastomosis. New  Proximal transverse colon enters and exits right abdominal wall hernia. Immediately subsequent section of transverse colon enters and exits left periumbilical hernia. As the large bowel Re enters the abdominal cavity beyond the second hernia, there is moderate wall thickening of the distal transverse colon, with wall thickening possibly involving the distal portion of the large bowel contained within the hernia as well. Moderate to severe large bowel wall thickening extends to the splenic flexure with mild large bowel wall thickening involving the proximal descending colon. There is mild surrounding inflammatory change. There is no pneumatosis. There is a 1.5 cm fat containing structure arising off of the splenic flexure image number 47 and 48 consistent with inflamed epiploic appendage.  Vascular/Lymphatic: No evidence of aneurysm. No significant adenopathy.   Reproductive: Known right cystic adnexal mass again identified unchanged at about 9 cm.  Other: No ascites  Musculoskeletal: No acute findings  IMPRESSION: 1. Right and left periumbilical abdominal wall hernias. Transverse colon is involved with both hernias with no evidence of obstruction. 2. Moderate inflammatory change involving the distal third of the transverse colon to the splenic flexure with mild inflammation involving proximal descending colon as well.  The distal portion of large bowel contained within the second hernia sac may also be inflamed. Although large bowel is involved with abdominal wall hernias, and although there is evidence of a single inflamed epiploic appendage, the appearance is not particularly consistent with either strangulation or epiploic appendagitis. Essentially there is nonspecific moderately prominent inflammation of the large bowel in the portion described above consistent with nonspecific colitis. 3. Known cystic right adnexal mass. As previously described in prior report, pelvic MRI recommended as malignancy is not excluded. 4. Mild infiltrate medial left lower lobe. Pneumonitis not excluded. This could represent atelectasis.   Electronically Signed   By: Skipper Cliche M.D.   On: 09/14/2015 18:27   Review of Systems  Constitutional: Negative for fever, chills, weight loss, malaise/fatigue and diaphoresis.  HENT: Negative.   Eyes: Negative.   Respiratory: Negative.   Cardiovascular: Negative.   Gastrointestinal: Positive for heartburn, nausea, vomiting, abdominal pain, constipation and blood in stool. Negative for melena.  Genitourinary: Negative.   Musculoskeletal: Positive for back pain and joint pain.  Skin: Negative.   Neurological: Negative.  Negative for weakness.  Psychiatric/Behavioral: Negative.    Blood pressure 131/62, pulse 85, temperature 98.8 F (37.1 C), temperature source Oral, resp. rate 14, height 5' 2"  (1.575 m), weight 92.8 kg (204 lb 9.4 oz),  SpO2 97 %. Physical Exam  Constitutional: She is oriented to person, place, and time. She appears well-developed and well-nourished.  HENT:  Head: Normocephalic and atraumatic.  Eyes: Conjunctivae and EOM are normal. Pupils are equal, round, and reactive to light.  Neck: Normal range of motion. Neck supple.  Cardiovascular: Normal rate.   Respiratory: Effort normal and breath sounds normal.  GI: Bowel sounds are normal. She exhibits no mass. There is no tenderness. There is no rebound and no guarding.  Large reducible Incisional hernia noted in the RLQ and the LLQ    Neurological: She is alert and oriented to person, place, and time.  Skin: Skin is warm and dry.  Psychiatric: She has a normal mood and affect. Her behavior is normal. Judgment and thought content normal.   Assessment/Plan: 1) Large incisional hernias with portion of the transverse colon in the hernia without any evidence of obstruction. Patient is scheduled to see Dr. Hassell Done on an OP basis.  2) History of complicated diverticulitis with a stricture in the rectosigmoid colon.  3) Chronic constipation and rectal bleeding-probably from her hemorrhoids.  4) GERD.  5) Abnormal CT scan: I do not feel she has an infectious colitis; her diarrhea was from Senna and OTC meds she had taken for her constipation. 6)  Morbid obesity/HTN/Hyperlipidemia. 7) OSA/Asthma.  8) Cystic right adnexal mass-/Ovarian cyst-being followed by Dr. Denman George.  Dmoni Fortson 09/15/2015, 6:49 PM

## 2015-09-16 LAB — URINE CULTURE: Culture: NO GROWTH

## 2015-09-16 LAB — C DIFFICILE QUICK SCREEN W PCR REFLEX
C Diff antigen: NEGATIVE
C Diff interpretation: NEGATIVE
C Diff toxin: NEGATIVE

## 2015-09-16 LAB — BASIC METABOLIC PANEL
Anion gap: 8 (ref 5–15)
BUN: 5 mg/dL — ABNORMAL LOW (ref 6–20)
CO2: 26 mmol/L (ref 22–32)
Calcium: 7.8 mg/dL — ABNORMAL LOW (ref 8.9–10.3)
Chloride: 103 mmol/L (ref 101–111)
Creatinine, Ser: 0.7 mg/dL (ref 0.44–1.00)
GFR calc Af Amer: >60 mL/min (ref >60)
GFR calc non Af Amer: >60 mL/min (ref >60)
Glucose, Bld: 108 mg/dL — ABNORMAL HIGH (ref 65–99)
Potassium: 4 mmol/L (ref 3.5–5.1)
Sodium: 137 mmol/L (ref 135–145)

## 2015-09-16 LAB — CBC
HCT: 33.2 % — ABNORMAL LOW (ref 36.0–46.0)
Hemoglobin: 10.7 g/dL — ABNORMAL LOW (ref 12.0–15.0)
MCH: 28.5 pg (ref 26.0–34.0)
MCHC: 32.2 g/dL (ref 30.0–36.0)
MCV: 88.3 fL (ref 78.0–100.0)
Platelets: 245 10*3/uL (ref 150–400)
RBC: 3.76 MIL/uL — ABNORMAL LOW (ref 3.87–5.11)
RDW: 14 % (ref 11.5–15.5)
WBC: 10.5 10*3/uL (ref 4.0–10.5)

## 2015-09-16 NOTE — Progress Notes (Signed)
Noted to have a minimal bloody (dark red) clots in her hat after trying to move her bowels. No stool noted.

## 2015-09-16 NOTE — Progress Notes (Signed)
[facility]                                                                              Patient Demographics  Jackie Stevens, is a 69 y.o. female, DOB - [birth year], IOM:[id number]  Admit date - 09/14/2015   Admitting Physician Edwin Dada, MD  Outpatient Primary MD for the patient is Penni Homans, MD  LOS - 2   Chief Complaint  Patient presents with  . Rectal Bleeding       Brief HPI  Jackie Stevens is a 69 y.o. 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 The patient was in her usual state of health until the night before admission when she had an episode of moderate to severe left lower quadrant pain near her hernia, accompanied by sweats, vomiting several times, subjective fevers, and then several loose bowel movements, which were mixed with bright red blood. This morning she felt better until midday when she had more cramps and rectal bleeding. In the ED, the patient had low grade fever, tachycardia, and leukocytosis. There was frank blood on rectal exam. CT of the abdomen and pelvis with contrast showed an area of transverse colon with thickening and inflammatory change adjacent to bowel within the hernia, without radiographic findings of strangulation or diverticulitis. The patient was given ciprofloxacin and metronidazole and developed hives near her IV site.  Patient was admitted for further workup.    Assessment & Plan    Principal Problem:   Colitis:  - CT abdomen showed area of transverse colon with thickening and inflammatory change adjacent to bowel within the hernia, no strength lesion or obstruction - Gen. surgery was consulted and recommended no surgical intervention - Advance diet to full liquids, continue IV Zosyn, IV fluids. If remains  stable and improving, will advance to soft solids tomorrow - Appreciate GI recommended - Follow C. difficile PCR  Active Problems: UTI urinary tract infection - Follow urine culture and sensitivities, continue IV Zosyn    Hyperlipidemia, mixed  - Continue statin    Essential hypertension - Currently stable, continue HCTZ, avapro    G E R D - placed on PPI    Thyroid disease -Continue Synthroid, TSH is 0.14.      Asthma, mild intermittent - Currently stable, no wheezing  Code Status: Full code   Family Communication: Discussed in detail with the patient, all imaging results, lab results explained to the patient    Disposition Plan: Hopefully DC home tomorrow if stable  Time Spent in minutes 3minutes  Procedures  CT abd  Consults   surgery  DVT Prophylaxis scd Medications  Scheduled Meds: . hydrochlorothiazide  25 mg Oral Daily  . irbesartan  300 mg Oral Daily  . levothyroxine  150 mcg Oral Once per day on Mon Tue Wed Thu Fri Sat  . [START ON 09/21/2015] levothyroxine  225 mcg Oral Once per day on Sun  . piperacillin-tazobactam (ZOSYN)  IV  3.375 g Intravenous  3 times per day  . simvastatin  40 mg Oral q1800   Continuous Infusions: . sodium chloride 100 mL/hr at 09/16/15 0102   PRN Meds:.ondansetron **OR** ondansetron (ZOFRAN) IV, traMADol   Antibiotics   Anti-infectives    Start     Dose/Rate Route Frequency Ordered Stop   09/15/15 0600  piperacillin-tazobactam (ZOSYN) IVPB 3.375 g     3.375 g 12.5 mL/hr over 240 Minutes Intravenous 3 times per day 09/14/15 2133     09/14/15 2200  valACYclovir (VALTREX) tablet 1,000 mg     1,000 mg Oral 3 times daily 09/14/15 2046 09/15/15 2118   09/14/15 2130  piperacillin-tazobactam (ZOSYN) IVPB 3.375 g     3.375 g 100 mL/hr over 30 Minutes Intravenous  Once 09/14/15 2046 09/14/15 2243   09/14/15 1845  ciprofloxacin (CIPRO) IVPB 400 mg     400 mg 200 mL/hr over 60 Minutes Intravenous  Once 09/14/15 1836 09/14/15  2010   09/14/15 1845  metroNIDAZOLE (FLAGYL) IVPB 500 mg     500 mg 100 mL/hr over 60 Minutes Intravenous  Once 09/14/15 1836 09/14/15 2019        Subjective:   Jackie Stevens was seen and examined today.  States had one episode of bowel movement with blood overnight. Having headache but no nausea or vomiting. No blurry vision. Abdominal pain is improving. No fevers or chills.  Patient denies dizziness, chest pain, shortness of breath, new weakness, numbess, tingling.   Objective:   Blood pressure 145/74, pulse 80, temperature 98.3 F (36.8 C), temperature source Oral, resp. rate 18, height 5\' 2"  (1.575 m), weight 93.3 kg (205 lb 11 oz), SpO2 97 %.  Wt Readings from Last 3 Encounters:  09/15/15 93.3 kg (205 lb 11 oz)  09/09/15 94.972 kg (209 lb 6 oz)  09/05/15 95.074 kg (209 lb 9.6 oz)     Intake/Output Summary (Last 24 hours) at 09/16/15 1225 Last data filed at 09/16/15 0900  Gross per 24 hour  Intake 2423.33 ml  Output   2150 ml  Net 273.33 ml    Exam  General: Alert and oriented x 3, NAD  HEENT:  PERRLA, EOMI, Anicteric Sclera, mucous membranes moist.   Neck: Supple, no JVD, no masses  CVS: S1 S2 clear, RRR.  Respiratory: CTAB  Abdomen: Soft,  mild diffuse tenderness on deep palpation ,ND, NBS  Ext: no cyanosis clubbing or edema  Neuro: no new deficit  Skin: No rashes  Psych: Normal affect and demeanor, alert and oriented x3    Data Review   Micro Results Recent Results (from the past 240 hour(s))  MRSA PCR Screening     Status: None   Collection Time: 09/14/15  9:40 PM  Result Value Ref Range Status   MRSA by PCR NEGATIVE NEGATIVE Final    Comment:        The GeneXpert MRSA Assay (FDA approved for NASAL specimens only), is one component of a comprehensive MRSA colonization surveillance program. It is not intended to diagnose MRSA infection nor to guide or monitor treatment for MRSA infections.   Urine culture     Status: None   Collection  Time: 09/15/15  8:39 AM  Result Value Ref Range Status   Specimen Description URINE, RANDOM  Final   Special Requests NONE  Final   Culture NO GROWTH 1 DAY  Final   Report Status 09/16/2015 FINAL  Final    Radiology Reports Ct Abdomen Pelvis W Contrast  09/14/2015   CLINICAL DATA:  Left lower quadrant pain and rectal bleeding with 3 episodes starting last night, history of diverticulitis  EXAM: CT ABDOMEN AND PELVIS WITH CONTRAST  TECHNIQUE: Multidetector CT imaging of the abdomen and pelvis was performed using the standard protocol following bolus administration of intravenous contrast.  CONTRAST:  193mL OMNIPAQUE IOHEXOL 300 MG/ML  SOLN  COMPARISON:  08/14/2015  FINDINGS: Lower chest: Mild infiltrate in the posterior medial left lower lobe which may represent atelectasis. Pneumonia or pneumonitis not excluded.  Hepatobiliary: Status post cholecystectomy. Mild presumably related biliary dilatation  Pancreas: Neck  Spleen: Negative  Adrenals/Urinary Tract: Normal except at the bladder is compressed by mass effect from large cystic right adnexal mass  Stomach/Bowel: There is an anterior abdominal wall hernia in the right periumbilical region, with hernia sac measuring 10 cm. Loops of both small and large bowel are contained within this. There is also a left periumbilical hernia measuring about 10 cm. This contains large bowel. There is a nonobstructive bowel gas pattern. Stomach is decompressed. Small bowel is normal. Appendix is normal. The distal large bowel shows an area of anastomosis. New  Proximal transverse colon enters and exits right abdominal wall hernia. Immediately subsequent section of transverse colon enters and exits left periumbilical hernia. As the large bowel Re enters the abdominal cavity beyond the second hernia, there is moderate wall thickening of the distal transverse colon, with wall thickening possibly involving the distal portion of the large bowel contained within the hernia as  well. Moderate to severe large bowel wall thickening extends to the splenic flexure with mild large bowel wall thickening involving the proximal descending colon. There is mild surrounding inflammatory change. There is no pneumatosis. There is a 1.5 cm fat containing structure arising off of the splenic flexure image number 47 and 48 consistent with inflamed epiploic appendage.  Vascular/Lymphatic: No evidence of aneurysm. No significant adenopathy.  Reproductive: Known right cystic adnexal mass again identified unchanged at about 9 cm.  Other: No ascites  Musculoskeletal: No acute findings  IMPRESSION: 1. Right and left periumbilical abdominal wall hernias. Transverse colon is involved with both hernias with no evidence of obstruction. 2. Moderate inflammatory change involving the distal third of the transverse colon to the splenic flexure with mild inflammation involving proximal descending colon as well. The distal portion of large bowel contained within the second hernia sac may also be inflamed. Although large bowel is involved with abdominal wall hernias, and although there is evidence of a single inflamed epiploic appendage, the appearance is not particularly consistent with either strangulation or epiploic appendagitis. Essentially there is nonspecific moderately prominent inflammation of the large bowel in the portion described above consistent with nonspecific colitis. 3. Known cystic right adnexal mass. As previously described in prior report, pelvic MRI recommended as malignancy is not excluded. 4. Mild infiltrate medial left lower lobe. Pneumonitis not excluded. This could represent atelectasis.   Electronically Signed   By: Skipper Cliche M.D.   On: 09/14/2015 18:27    CBC  Recent Labs Lab 09/14/15 1522 09/15/15 0618 09/16/15 0817  WBC 20.6* 11.1* 10.5  HGB 11.6* 10.1* 10.7*  HCT 35.5* 30.6* 33.2*  PLT 281 225 245  MCV 87.0 88.4 88.3  MCH 28.4 29.2 28.5  MCHC 32.7 33.0 32.2  RDW 14.1  14.6 14.0  LYMPHSABS 1.4  --   --   MONOABS 0.9  --   --   EOSABS 0.0  --   --   BASOSABS 0.0  --   --  Chemistries   Recent Labs Lab 09/09/15 1425 09/14/15 1522 09/15/15 0618 09/16/15 0817  NA 135 135 137 137  K 4.3 3.8 3.6 4.0  CL 97 100* 104 103  CO2 33* 27 26 26   GLUCOSE 89 122* 102* 108*  BUN 21 14 7  5*  CREATININE 0.77 0.73 0.67 0.70  CALCIUM 8.9 8.5* 7.8* 7.8*  AST 14 20  --   --   ALT 16 22  --   --   ALKPHOS 88 82  --   --   BILITOT 0.4 0.8  --   --    ------------------------------------------------------------------------------------------------------------------ estimated creatinine clearance is 68.6 mL/min (by C-G formula based on Cr of 0.7). ------------------------------------------------------------------------------------------------------------------ No results for input(s): HGBA1C in the last 72 hours. ------------------------------------------------------------------------------------------------------------------ No results for input(s): CHOL, HDL, LDLCALC, TRIG, CHOLHDL, LDLDIRECT in the last 72 hours. ------------------------------------------------------------------------------------------------------------------ No results for input(s): TSH, T4TOTAL, T3FREE, THYROIDAB in the last 72 hours.  Invalid input(s): FREET3 ------------------------------------------------------------------------------------------------------------------  Recent Labs  09/15/15 0618  VITAMINB12 1872*  FOLATE 22.0  FERRITIN 52  TIBC 249*  IRON 33    Coagulation profile No results for input(s): INR, PROTIME in the last 168 hours.  No results for input(s): DDIMER in the last 72 hours.  Cardiac Enzymes No results for input(s): CKMB, TROPONINI, MYOGLOBIN in the last 168 hours.  Invalid input(s): CK ------------------------------------------------------------------------------------------------------------------ Invalid input(s): POCBNP  No results for  input(s): GLUCAP in the last 72 hours.   Marialy Urbanczyk M.D. [facility] 09/16/2015, 12:25 PM  Pager: [phone] Between 7am to 7pm - call Pager - [phone]  After 7pm go to [email or web address] - password [email or web address]  Call night coverage person covering after 7pm

## 2015-09-16 NOTE — Progress Notes (Signed)
Subjective: On bout of hematochezia last night.  It came out as a "jet" of blood.  Objective: Vital signs in last 24 hours: Temp:  [98.2 F (36.8 C)-98.8 F (37.1 C)] 98.2 F (36.8 C) (10/04 0500) Pulse Rate:  [80-85] 80 (10/04 0500) Resp:  [14-18] 18 (10/04 0500) BP: (131-158)/(62-72) 158/72 mmHg (10/04 0500) SpO2:  [96 %-100 %] 96 % (10/04 0500) Weight:  [93.3 kg (205 lb 11 oz)] 93.3 kg (205 lb 11 oz) (10/03 2100) Last BM Date: 09/14/15  Intake/Output from previous day: 10/03 0701 - 10/04 0700 In: 2678.3 [P.O.:480; I.V.:2048.3; IV Piggyback:150] Out: 2500 [Urine:2500] Intake/Output this shift:    General appearance: alert and no distress GI: soft, non-tender; bowel sounds normal; no masses,  no organomegaly Rectal: no overt hemorrhoidal source  Lab Results:  Recent Labs  09/14/15 1522 09/15/15 0618  WBC 20.6* 11.1*  HGB 11.6* 10.1*  HCT 35.5* 30.6*  PLT 281 225   BMET  Recent Labs  09/14/15 1522 09/15/15 0618  NA 135 137  K 3.8 3.6  CL 100* 104  CO2 27 26  GLUCOSE 122* 102*  BUN 14 7  CREATININE 0.73 0.67  CALCIUM 8.5* 7.8*   LFT  Recent Labs  09/14/15 1522  PROT 6.0*  ALBUMIN 3.2*  AST 20  ALT 22  ALKPHOS 82  BILITOT 0.8   PT/INR No results for input(s): LABPROT, INR in the last 72 hours. Hepatitis Panel No results for input(s): HEPBSAG, HCVAB, HEPAIGM, HEPBIGM in the last 72 hours. C-Diff No results for input(s): CDIFFTOX in the last 72 hours. Fecal Lactopherrin No results for input(s): FECLLACTOFRN in the last 72 hours.  Studies/Results: Ct Abdomen Pelvis W Contrast  09/14/2015   CLINICAL DATA:  Left lower quadrant pain and rectal bleeding with 3 episodes starting last night, history of diverticulitis  EXAM: CT ABDOMEN AND PELVIS WITH CONTRAST  TECHNIQUE: Multidetector CT imaging of the abdomen and pelvis was performed using the standard protocol following bolus administration of intravenous contrast.  CONTRAST:  16mL OMNIPAQUE IOHEXOL  300 MG/ML  SOLN  COMPARISON:  08/14/2015  FINDINGS: Lower chest: Mild infiltrate in the posterior medial left lower lobe which may represent atelectasis. Pneumonia or pneumonitis not excluded.  Hepatobiliary: Status post cholecystectomy. Mild presumably related biliary dilatation  Pancreas: Neck  Spleen: Negative  Adrenals/Urinary Tract: Normal except at the bladder is compressed by mass effect from large cystic right adnexal mass  Stomach/Bowel: There is an anterior abdominal wall hernia in the right periumbilical region, with hernia sac measuring 10 cm. Loops of both small and large bowel are contained within this. There is also a left periumbilical hernia measuring about 10 cm. This contains large bowel. There is a nonobstructive bowel gas pattern. Stomach is decompressed. Small bowel is normal. Appendix is normal. The distal large bowel shows an area of anastomosis. New  Proximal transverse colon enters and exits right abdominal wall hernia. Immediately subsequent section of transverse colon enters and exits left periumbilical hernia. As the large bowel Re enters the abdominal cavity beyond the second hernia, there is moderate wall thickening of the distal transverse colon, with wall thickening possibly involving the distal portion of the large bowel contained within the hernia as well. Moderate to severe large bowel wall thickening extends to the splenic flexure with mild large bowel wall thickening involving the proximal descending colon. There is mild surrounding inflammatory change. There is no pneumatosis. There is a 1.5 cm fat containing structure arising off of the splenic flexure image  number 54 and 48 consistent with inflamed epiploic appendage.  Vascular/Lymphatic: No evidence of aneurysm. No significant adenopathy.  Reproductive: Known right cystic adnexal mass again identified unchanged at about 9 cm.  Other: No ascites  Musculoskeletal: No acute findings  IMPRESSION: 1. Right and left periumbilical  abdominal wall hernias. Transverse colon is involved with both hernias with no evidence of obstruction. 2. Moderate inflammatory change involving the distal third of the transverse colon to the splenic flexure with mild inflammation involving proximal descending colon as well. The distal portion of large bowel contained within the second hernia sac may also be inflamed. Although large bowel is involved with abdominal wall hernias, and although there is evidence of a single inflamed epiploic appendage, the appearance is not particularly consistent with either strangulation or epiploic appendagitis. Essentially there is nonspecific moderately prominent inflammation of the large bowel in the portion described above consistent with nonspecific colitis. 3. Known cystic right adnexal mass. As previously described in prior report, pelvic MRI recommended as malignancy is not excluded. 4. Mild infiltrate medial left lower lobe. Pneumonitis not excluded. This could represent atelectasis.   Electronically Signed   By: Skipper Cliche M.D.   On: 09/14/2015 18:27    Medications:  Scheduled: . hydrochlorothiazide  25 mg Oral Daily  . irbesartan  300 mg Oral Daily  . levothyroxine  150 mcg Oral Once per day on Mon Tue Wed Thu Fri Sat  . [START ON 09/21/2015] levothyroxine  225 mcg Oral Once per day on Sun  . piperacillin-tazobactam (ZOSYN)  IV  3.375 g Intravenous 3 times per day  . simvastatin  40 mg Oral q1800   Continuous: . sodium chloride 100 mL/hr at 09/16/15 0102    Assessment/Plan: 1) Nonspecific colitis. 2) ABM wall hernias. 3) Hematochezia.   Her report of a jet of blood appears to be consistent with a hemorrhoidal source, but I was not able to visualize any overt abnormalities.  I do not know if she has an ischemic colitis or an infectious colitis.  Her WBC has decreased on Zosyn.  Clinically she appears well.  Her HGB has dropped slightly.    Plan: 1) No endoscopic procedures at this time. 2)  Supportive care for now. 3) ? D/C home tomorrow.   LOS: 2 days   Jackie Stevens D 09/16/2015, 7:04 AM

## 2015-09-17 LAB — BASIC METABOLIC PANEL
Anion gap: 7 (ref 5–15)
BUN: <5 mg/dL — ABNORMAL LOW (ref 6–20)
CO2: 26 mmol/L (ref 22–32)
Calcium: 7.8 mg/dL — ABNORMAL LOW (ref 8.9–10.3)
Chloride: 101 mmol/L (ref 101–111)
Creatinine, Ser: 0.76 mg/dL (ref 0.44–1.00)
GFR calc Af Amer: >60 mL/min (ref >60)
GFR calc non Af Amer: >60 mL/min (ref >60)
Glucose, Bld: 108 mg/dL — ABNORMAL HIGH (ref 65–99)
Potassium: 3.5 mmol/L (ref 3.5–5.1)
Sodium: 134 mmol/L — ABNORMAL LOW (ref 135–145)

## 2015-09-17 LAB — CBC
HCT: 29.1 % — ABNORMAL LOW (ref 36.0–46.0)
Hemoglobin: 9.7 g/dL — ABNORMAL LOW (ref 12.0–15.0)
MCH: 29.3 pg (ref 26.0–34.0)
MCHC: 33.3 g/dL (ref 30.0–36.0)
MCV: 87.9 fL (ref 78.0–100.0)
Platelets: 208 10*3/uL (ref 150–400)
RBC: 3.31 MIL/uL — ABNORMAL LOW (ref 3.87–5.11)
RDW: 14 % (ref 11.5–15.5)
WBC: 7.9 10*3/uL (ref 4.0–10.5)

## 2015-09-17 MED ORDER — SACCHAROMYCES BOULARDII 250 MG PO CAPS
250.0000 mg | ORAL_CAPSULE | Freq: Two times a day (BID) | ORAL | Status: DC
  Administered 2015-09-17: 250 mg via ORAL
  Filled 2015-09-17: qty 1

## 2015-09-17 MED ORDER — NYSTATIN 100000 UNIT/ML MT SUSP
5.0000 mL | Freq: Three times a day (TID) | OROMUCOSAL | Status: DC
  Administered 2015-09-17 (×2): 500000 [IU] via ORAL
  Filled 2015-09-17 (×2): qty 5

## 2015-09-17 MED ORDER — NYSTATIN 100000 UNIT/ML MT SUSP: 5.0000 mL | Freq: Three times a day (TID) | OROMUCOSAL | Status: DC

## 2015-09-17 MED ORDER — AMOXICILLIN-POT CLAVULANATE 875-125 MG PO TABS
1.0000 | ORAL_TABLET | Freq: Two times a day (BID) | ORAL | Status: DC
  Administered 2015-09-17: 1 via ORAL
  Filled 2015-09-17: qty 1

## 2015-09-17 MED ORDER — TRAMADOL HCL 50 MG PO TABS
50.0000 mg | ORAL_TABLET | Freq: Four times a day (QID) | ORAL | Status: DC | PRN
Start: 2015-09-17 — End: 2015-12-09

## 2015-09-17 MED ORDER — ONDANSETRON 4 MG PO TBDP
4.0000 mg | ORAL_TABLET | Freq: Four times a day (QID) | ORAL | Status: DC | PRN
Start: 2015-09-17 — End: 2015-10-10

## 2015-09-17 MED ORDER — AMOXICILLIN-POT CLAVULANATE 875-125 MG PO TABS: 1.0000 | ORAL_TABLET | Freq: Two times a day (BID) | ORAL | Status: DC

## 2015-09-17 NOTE — Discharge Summary (Signed)
Physician Discharge Summary   Patient ID: Jackie Stevens MRN: [id number] DOB/AGE: May 12, 1946 69 y.o.  Admit date: 09/14/2015 Discharge date: 09/17/2015  Primary Care Physician:  Penni Homans, MD  Discharge Diagnoses:    . Colitis .  Obstructive sleep apnea . Hyperlipidemia, mixed . Essential hypertension . G E R D . Thyroid disease . Asthma, mild intermittent . Anemia  Consults:   Gastroenterology- Dr Benson [place]    Recommendations for Outpatient Follow-up:  Patient was admitted to follow-up with Dr. Geoffry Paradise in 2 weeks. If symptoms are not improving, she may need coloscopy or a flexible sigmoidoscopy.  TESTS THAT NEED FOLLOW-UP Stool cultures   DIET: Full liquid diet to soft solids    Allergies:   Allergies  Allergen Reactions  . Niacin Other (See Comments) and Cough    "cough til I threw up" (12/19/2012)  . Neomycin-Bacitracin Zn-Polymyx Rash    Polysporin- is tolerated   . Ciprofloxacin Hives    Got cipro and flagyl at same time, localized hives to IV arm  . Flagyl [Metronidazole] Hives    Got cipro and flagyl at same time, localized hives to IV arm     Discharge Medications:   Medication List    STOP taking these medications        aspirin EC 81 MG tablet     naproxen 375 MG tablet  Commonly known as:  NAPROSYN     NEOMYCIN-POLYMYXIN-HYDROCORTISONE 1 % Soln otic solution  Commonly known as:  CORTISPORIN      TAKE these medications        albuterol 108 (90 BASE) MCG/ACT inhaler  Commonly known as:  PROVENTIL HFA;VENTOLIN HFA  Inhale 2 puffs into the lungs every 6 (six) hours as needed for wheezing.     amoxicillin-clavulanate 875-125 MG tablet  Commonly known as:  AUGMENTIN  Take 1 tablet by mouth 2 (two) times daily. X 10days     cetirizine 10 MG tablet  Commonly known as:  ZYRTEC  Take 1 tablet (10 mg total) by mouth daily.     clobetasol cream 0.05 %  Commonly known as:  TEMOVATE  Apply 1 application topically 2 (two) times daily.      fluticasone 50 MCG/ACT nasal spray  Commonly known as:  FLONASE  Place 2 sprays into both nostrils daily.     hydrochlorothiazide 25 MG tablet  Commonly known as:  HYDRODIURIL  Take 1 tablet (25 mg total) by mouth daily.     hydrOXYzine 10 MG tablet  Commonly known as:  ATARAX/VISTARIL  Take 1 tablet (10 mg total) by mouth every 8 (eight) hours as needed.     KRILL OIL PO  Take 500 mg by mouth daily.     levothyroxine 150 MCG tablet  Commonly known as:  SYNTHROID, LEVOTHROID  Take 150-225 mcg by mouth daily. 1 tablet (150 mcg) every day except Sunday, 1.5 tablet (225 mcg) on Sunday     NITROSTAT 0.4 MG SL tablet  Generic drug:  nitroGLYCERIN  PLACE 1 TABLET UNDER THE TONGUE EVERY 5 MINUTES AS NEEDED FOR CHEST PAIN     nystatin 100000 UNIT/ML suspension  Commonly known as:  MYCOSTATIN  Take 5 mLs (500,000 Units total) by mouth 4 (four) times daily -  before meals and at bedtime. Until resolved     omeprazole 20 MG capsule  Commonly known as:  PRILOSEC  TAKE 1 CAPSULE EVERY MORNING AT BREAFAST     ondansetron 4 MG disintegrating tablet  Commonly known as:  ZOFRAN ODT  Take 1 tablet (4 mg total) by mouth every 6 (six) hours as needed for nausea or vomiting.     Polyethyl Glycol-Propyl Glycol 0.4-0.3 % Gel ophthalmic gel  Commonly known as:  SYSTANE  Apply 1 drop to eye at bedtime.     PROBIOTIC ADVANCED PO  Take 2 tablets by mouth daily. Digestive Advantage     psyllium 0.52 G capsule  Commonly known as:  REGULOID  Take 0.52 g by mouth at bedtime.     ranitidine 150 MG tablet  Commonly known as:  ZANTAC  TAKE 1 TABLET BY MOUTH AT BEDTIME     senna 8.6 MG Tabs tablet  Commonly known as:  SENOKOT  Take 1 tablet by mouth daily as needed for mild constipation.     simvastatin 40 MG tablet  Commonly known as:  ZOCOR  TAKE 1 TABLET (40 MG TOTAL) BY MOUTH AT BEDTIME.     sodium chloride 0.65 % Soln nasal spray  Commonly known as:  OCEAN  Place 1 spray into both  nostrils at bedtime.     traMADol 50 MG tablet  Commonly known as:  ULTRAM  Take 1 tablet (50 mg total) by mouth every 6 (six) hours as needed for moderate pain.     TURMERIC PO  Take 1 tablet by mouth daily.     valACYclovir 1000 MG tablet  Commonly known as:  VALTREX  Take 1 tablet (1,000 mg total) by mouth 3 (three) times daily.     valsartan 320 MG tablet  Commonly known as:  DIOVAN  Take 1 tablet (320 mg total) by mouth daily.     vitamin B-12 1000 MCG tablet  Commonly known as:  CYANOCOBALAMIN  Take 2,500 mcg by mouth daily.     Vitamin D (Cholecalciferol) 1000 UNITS Caps  Take 1 capsule by mouth daily.         Brief H and P: For complete details please refer to admission H and P, but in briefCheryl P Stevens is a 69 y.o. 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 The patient was in her usual state of health until the night before admission when she had an episode of moderate to severe left lower quadrant pain near her hernia, accompanied by sweats, vomiting several times, subjective fevers, and then several loose bowel movements, which were mixed with bright red blood. This morning she felt better until midday when she had more cramps and rectal bleeding. In the ED, the patient had low grade fever, tachycardia, and leukocytosis. There was frank blood on rectal exam. CT of the abdomen and pelvis with contrast showed an area of transverse colon with thickening and inflammatory change adjacent to bowel within the hernia, without radiographic findings of strangulation or diverticulitis. The patient was given ciprofloxacin and metronidazole and developed hives near her IV site.  Patient was admitted for further workup.   Hospital Course:   Abdominal pain with nausea, rectal  bleeding, due to Colitis:  - CT abdomen showed area of transverse colon with thickening and inflammatory change adjacent to bowel within the hernia, no strength lesion or obstruction.  Gen. surgery was consulted and recommended no surgical intervention - Gastroenterology was consulted, recommended continuing the IV antibiotics. Patient is now tolerating soft solids. C. difficile was negative. Per gastroenterology, no  endoscopy procedures needed. Patient was discharged on Augmentin for 10 days with probiotic.    Hyperlipidemia, mixed - Continue statin   Essential hypertension - Currently stable, continue HCTZ, avapro   G E R D - placed on PPI   Thyroid disease -Continue Synthroid, TSH is 0.14.    Asthma, mild intermittent - Currently stable, no wheezing   Day of Discharge BP 156/59 mmHg  Pulse 71  Temp(Src) 98.4 F (36.9 C) (Oral)  Resp 18  Ht 5\' 2"  (1.575 m)  Wt 94.2 kg (207 lb 10.8 oz)  BMI 37.97 kg/m2  SpO2 98%  Physical Exam: General: Alert and awake oriented x3 not in any acute distress. HEENT: anicteric sclera, pupils reactive to light and accommodation CVS: S1-S2 clear no murmur rubs or gallops Chest: clear to auscultation bilaterally, no wheezing rales or rhonchi Abdomen: soft nontender, nondistended, normal bowel sounds Extremities: no cyanosis, clubbing or edema noted bilaterally Neuro: Cranial nerves II-XII intact, no focal neurological deficits   The results of significant diagnostics from this hospitalization (including imaging, microbiology, ancillary and laboratory) are listed below for reference.    LAB RESULTS: Basic Metabolic Panel:  Recent Labs Lab 09/16/15 0817 09/17/15 0505  NA 137 134*  K 4.0 3.5  CL 103 101  CO2 26 26  GLUCOSE 108* 108*  BUN 5* <5*  CREATININE 0.70 0.76  CALCIUM 7.8* 7.8*   Liver Function Tests:  Recent Labs Lab 09/14/15 1522  AST 20  ALT 22  ALKPHOS 82  BILITOT 0.8  PROT 6.0*  ALBUMIN 3.2*     Recent Labs Lab 09/14/15 1522  LIPASE 12*   No results for input(s): AMMONIA in the last 168 hours. CBC:  Recent Labs Lab 09/14/15 1522  09/16/15 0817 09/17/15 0505  WBC 20.6*  < > 10.5 7.9  NEUTROABS 18.3*  --   --   --   HGB 11.6*  < > 10.7* 9.7*  HCT 35.5*  < > 33.2* 29.1*  MCV 87.0  < > 88.3 87.9  PLT 281  < > 245 208  < > = values in this interval not displayed. Cardiac Enzymes: No results for input(s): CKTOTAL, CKMB, CKMBINDEX, TROPONINI in the last 168 hours. BNP: Invalid input(s): POCBNP CBG: No results for input(s): GLUCAP in the last 168 hours.  Significant Diagnostic Studies:  Ct Abdomen Pelvis W Contrast  09/14/2015   CLINICAL DATA:  Left lower quadrant pain and rectal bleeding with 3 episodes starting last night, history of diverticulitis  EXAM: CT ABDOMEN AND PELVIS WITH CONTRAST  TECHNIQUE: Multidetector CT imaging of the abdomen and pelvis was performed using the standard protocol following bolus administration of intravenous contrast.  CONTRAST:  168mL OMNIPAQUE IOHEXOL 300 MG/ML  SOLN  COMPARISON:  08/14/2015  FINDINGS: Lower chest: Mild infiltrate in the posterior medial left lower lobe which may represent atelectasis. Pneumonia or pneumonitis not excluded.  Hepatobiliary: Status post cholecystectomy. Mild presumably related biliary dilatation  Pancreas: Neck  Spleen: Negative  Adrenals/Urinary Tract: Normal except at the bladder is compressed by mass effect from large cystic right adnexal mass  Stomach/Bowel: There is an anterior abdominal wall hernia in the right periumbilical region, with hernia sac measuring 10 cm. Loops of both small and large bowel are contained within this. There is also a left periumbilical hernia measuring about 10 cm. This contains large bowel. There is a nonobstructive bowel gas pattern. Stomach is decompressed. Small bowel is normal. Appendix is normal. The distal large bowel shows an area of anastomosis.  New  Proximal transverse  colon enters and exits right abdominal wall hernia. Immediately subsequent section of transverse colon enters and exits left periumbilical hernia. As the large bowel Re enters the abdominal cavity beyond the second hernia, there is moderate wall thickening of the distal transverse colon, with wall thickening possibly involving the distal portion of the large bowel contained within the hernia as well. Moderate to severe large bowel wall thickening extends to the splenic flexure with mild large bowel wall thickening involving the proximal descending colon. There is mild surrounding inflammatory change. There is no pneumatosis. There is a 1.5 cm fat containing structure arising off of the splenic flexure image number 47 and 48 consistent with inflamed epiploic appendage.  Vascular/Lymphatic: No evidence of aneurysm. No significant adenopathy.  Reproductive: Known right cystic adnexal mass again identified unchanged at about 9 cm.  Other: No ascites  Musculoskeletal: No acute findings  IMPRESSION: 1. Right and left periumbilical abdominal wall hernias. Transverse colon is involved with both hernias with no evidence of obstruction. 2. Moderate inflammatory change involving the distal third of the transverse colon to the splenic flexure with mild inflammation involving proximal descending colon as well. The distal portion of large bowel contained within the second hernia sac may also be inflamed. Although large bowel is involved with abdominal wall hernias, and although there is evidence of a single inflamed epiploic appendage, the appearance is not particularly consistent with either strangulation or epiploic appendagitis. Essentially there is nonspecific moderately prominent inflammation of the large bowel in the portion described above consistent with nonspecific colitis. 3. Known cystic right adnexal mass. As previously described in prior report, pelvic MRI recommended as malignancy is not excluded. 4. Mild infiltrate  medial left lower lobe. Pneumonitis not excluded. This could represent atelectasis.   Electronically Signed   By: Skipper Cliche M.D.   On: 09/14/2015 18:27    2D ECHO:   Disposition and Follow-up:     Discharge Instructions    Diet - low sodium heart healthy    Complete by:  As directed      Discharge instructions    Complete by:  As directed   Please continue SOFT diet or FULL LIQUIDS while the colitis is improving.     Increase activity slowly    Complete by:  As directed             DISPOSITION: Home   DISCHARGE FOLLOW-UP Follow-up Information    Follow up with HUNG,PATRICK D, MD. Schedule an appointment as soon as possible for a visit in 2 weeks.   Specialty:  Gastroenterology   Why:  for hospital follow-up   Contact information:   [address] [place] [address] [phone]       Follow up with Penni Homans, MD. Schedule an appointment as soon as possible for a visit in 2 weeks.   Specialty:  Family Medicine   Why:  for hospital follow-up   Contact information:   [address] [place] [address] [phone]        Time spent on Discharge: 37  minutes  Signed:   Kalis Friese M.D. [facility] 09/17/2015, 1:28 PM Pager: [phone]

## 2015-09-17 NOTE — Clinical Documentation Improvement (Signed)
Hospitalist  Can the diagnosis of systemic infection be further specified?   Sepsis - specify causative organism if known  Identify etiology of Sepsis - Device, Implant, Graft, Infusion, Abortion  Specify organ dysfunction - Respiratory Failure, Encephalopathy, Acute Kidney Failure, Pneumonia, UTI, Other (specify), Unable to Clinically Determine}  Other  Clinically Undetermined  Document any associated diagnoses/conditions.   Supporting Information: Early Sepsis related to Colitis per 10/03 progress notes. Per flowsheets patient is afebrile, stable blood pressures.  Lactic acid: 10/02:  1.4. 10/02:  1.61.  Please exercise your independent, professional judgment when responding. A specific answer is not anticipated or expected.   Thank You,  [place] [phone]

## 2015-09-17 NOTE — Care Management Important Message (Signed)
Important Message  Patient Details  Name: Jackie Stevens MRN: [id number] Date of Birth: 09/11/1946   Medicare Important Message Given:  Yes-second notification given    Delorse Lek 09/17/2015, 1:38 PM

## 2015-09-17 NOTE — Care Management Note (Signed)
Case Management Note  Patient Details  Name: Jackie Stevens MRN: [id number] Date of Birth: Jan 05, 1946  Subjective/Objective:      CM following for progression and d/c planning.              Action/Plan: No d/c needs identified.   Expected Discharge Date:       09/17/2015           Expected Discharge Plan:  Home/Self Care  In-House Referral:  NA  Discharge planning Services  NA  Post Acute Care Choice:  NA Choice offered to:  NA  DME Arranged:  N/A DME Agency:  NA  HH Arranged:  NA HH Agency:  NA  Status of Service:  Completed, signed off  Medicare Important Message Given:  Yes-second notification given Date Medicare IM Given:    Medicare IM give by:    Date Additional Medicare IM Given:    Additional Medicare Important Message give by:     If discussed at [place] of Stay Meetings, dates discussed:    Additional Comments:  Anai, [place], RN 09/17/2015, 1:45 PM

## 2015-09-20 LAB — STOOL CULTURE

## 2015-09-26 ENCOUNTER — Ambulatory Visit: Payer: Self-pay | Admitting: Surgery

## 2015-09-26 NOTE — H&P (Signed)
Chief Complaint:  Ventral hernia and cystic ovary.  History of Present Illness:  Jackie Stevens is an 69 y.o. female with a ventral hernia containing her transverse colon. She has been evaluated by Dr. Denman George and has an 8 cm ovarian cyst with a normal CA 125.  It is thought to be benign but with the concern by its size and the need for resection Dr. Denman George felt that perhaps we should combine cystectomy with ventral hernia repair.  I reviewed a recent CT scan where her transverse colon is up in this incisional hernia from Dr. Richarda Blade prior bowel resection.  I would try to get in laparoscopically and the upper abdomen and see how severe the adhesions are and tried to do some more all of this laparoscopically.  If not then we'll need to open and created a pathway for Dr. Denman George to remove her ovary and then try to go ahead and fix her ventral hernia.  We will do a bowel prep preop.  Have seen her and explaining this to her and her family.  We'll try to coordinate Dr. Denman George scheduled mind to move forward with combined ventral hernia repair and removal of ovary.   Past Medical History  Diagnosis Date  . Allergic rhinitis   . GERD (gastroesophageal reflux disease)   . Hyperlipidemia   . Chicken pox as a child  . Measles as a child  . Insomnia   . Thyroid disease   . Constipation   . Diverticulitis 10/25/2012    pt. reports that a drain was placed - 09/2012    . Abnormal cervical cytology 10/25/2012    Follows with Dr Leavy Cella of Gyn  . Asthma   . Heart murmur   . Complication of anesthesia   . PONV (postoperative nausea and vomiting)   . HTN (hypertension)     stress test completed by Anselm Lis, diagnosed as GERD  . Thyroid cancer ([place]) 1980's  . Hypothyroidism   . H/O hiatal hernia   . External hemorrhoid, bleeding     "sometimes" (2013-01-11)  . Arthritis     "knees; right thumb; shoulders" (2013-01-11)  . Kidney stones 1970's    "passed on their own" (Jan 11, 2013)  . OSA on CPAP   . UTI  (urinary tract infection) 04/02/2013  . Anemia 10/06/2013  . Preventative health care 10/06/2013    [facility] Pneumovax in 2012  . Low back pain 06/09/2014  . Medicare annual wellness visit, subsequent 10/06/2013    [facility] Pneumovax in 2012   . Paresthesia 03/23/2015    Left face    Past Surgical History  Procedure Laterality Date  . Thyroidectomy, partial  1988    "then did iodine to remove the rest" (Jan 11, 2013)  . Sigmoid resection / rectopexy  2013/01/11  . Colon surgery    . Tonsillectomy  1951?  Marland Kitchen Cholecystectomy  1990  . Vaginal hysterectomy  1970's    "still have my ovaries" (01-11-2013)  . Dilation and curettage of uterus  1960's    "lots of them; had miscarriages" (2013/01/11)  . Transrectal drainage of pelvic abscess  10/27/2012  . Colostomy revision  2013/01/11    Procedure: COLON RESECTION SIGMOID;  Surgeon: Gwenyth Ober, MD;  Location: [facility];  Service: General;  Laterality: N/A;  . Cystoscopy with stent placement  11-Jan-2013    Procedure: CYSTOSCOPY WITH STENT PLACEMENT;  Surgeon: Hanley Ben, MD;  Location: [place];  Service: Urology;  Laterality: N/A;  Current Outpatient Prescriptions  Medication Sig Dispense Refill  . albuterol (PROVENTIL HFA;VENTOLIN HFA) 108 (90 BASE) MCG/ACT inhaler Inhale 2 puffs into the lungs every 6 (six) hours as needed for wheezing. 1 Inhaler 2  . amoxicillin-clavulanate (AUGMENTIN) 875-125 MG tablet Take 1 tablet by mouth 2 (two) times daily. X 10days 20 tablet 0  . cetirizine (ZYRTEC) 10 MG tablet Take 1 tablet (10 mg total) by mouth daily. 30 tablet 11  . clobetasol cream (TEMOVATE) 5.46 % Apply 1 application topically 2 (two) times daily.   0  . fluticasone (FLONASE) 50 MCG/ACT nasal spray Place 2 sprays into both nostrils daily. 16 g 2  . hydrochlorothiazide (HYDRODIURIL) 25 MG tablet Take 1 tablet (25 mg total) by mouth daily. 90 tablet 2  . hydrOXYzine (ATARAX/VISTARIL) 10 MG tablet Take 1 tablet (10 mg  total) by mouth every 8 (eight) hours as needed. (Patient taking differently: Take 10 mg by mouth every 8 (eight) hours as needed for itching. ) 15 tablet 0  . KRILL OIL PO Take 500 mg by mouth daily.     Marland Kitchen levothyroxine (SYNTHROID, LEVOTHROID) 150 MCG tablet Take 150-225 mcg by mouth daily. 1 tablet (150 mcg) every day except Sunday, 1.5 tablet (225 mcg) on Sunday    . NITROSTAT 0.4 MG SL tablet PLACE 1 TABLET UNDER THE TONGUE EVERY 5 MINUTES AS NEEDED FOR CHEST PAIN 25 tablet 0  . nystatin (MYCOSTATIN) 100000 UNIT/ML suspension Take 5 mLs (500,000 Units total) by mouth 4 (four) times daily -  before meals and at bedtime. Until resolved 60 mL 2  . omeprazole (PRILOSEC) 20 MG capsule TAKE 1 CAPSULE EVERY MORNING AT BREAFAST 90 capsule 2  . ondansetron (ZOFRAN ODT) 4 MG disintegrating tablet Take 1 tablet (4 mg total) by mouth every 6 (six) hours as needed for nausea or vomiting. 30 tablet 0  . Polyethyl Glycol-Propyl Glycol 0.4-0.3 % GEL Apply 1 drop to eye at bedtime.    . Probiotic Product (PROBIOTIC ADVANCED PO) Take 2 tablets by mouth daily. Digestive Advantage    . psyllium (REGULOID) 0.52 G capsule Take 0.52 g by mouth at bedtime.    . ranitidine (ZANTAC) 150 MG tablet TAKE 1 TABLET BY MOUTH AT BEDTIME 90 tablet 0  . senna (SENOKOT) 8.6 MG TABS tablet Take 1 tablet by mouth daily as needed for mild constipation.    . simvastatin (ZOCOR) 40 MG tablet TAKE 1 TABLET (40 MG TOTAL) BY MOUTH AT BEDTIME. 90 tablet 2  . sodium chloride (OCEAN) 0.65 % SOLN nasal spray Place 1 spray into both nostrils at bedtime.    . traMADol (ULTRAM) 50 MG tablet Take 1 tablet (50 mg total) by mouth every 6 (six) hours as needed for moderate pain. 30 tablet 0  . TURMERIC PO Take 1 tablet by mouth daily.     . valACYclovir (VALTREX) 1000 MG tablet Take 1 tablet (1,000 mg total) by mouth 3 (three) times daily. 21 tablet 0  . valsartan (DIOVAN) 320 MG tablet Take 1 tablet (320 mg total) by mouth daily. 90 tablet 2  .  vitamin B-12 (CYANOCOBALAMIN) 1000 MCG tablet Take 2,500 mcg by mouth daily.    . Vitamin D, Cholecalciferol, 1000 UNITS CAPS Take 1 capsule by mouth daily.     No current facility-administered medications for this visit.   Niacin; Neomycin-bacitracin zn-polymyx; Ciprofloxacin; and Flagyl Family History  Problem Relation Age of Onset  . Heart disease Father   . Pneumonia Father   . Hypertension  Father   . Hyperlipidemia Father   . Cancer Father     skin  . Stroke Father   . Cancer Paternal Aunt   . Alzheimer's disease Mother   . Heart disease Mother   . Emphysema Brother     marijuana and cigarettes  . Alcohol abuse Brother   . Hearing loss Brother   . Diabetes Maternal Grandmother   . Alzheimer's disease Paternal Grandmother   . Cancer Paternal Grandmother     lung?- smoker  . Hyperlipidemia Paternal Grandmother   . Heart attack Paternal Grandfather   . Alcohol abuse Paternal Grandfather   . Neurofibromatosis Son     schwanomatosis  . Neurofibromatosis Son     swanomatosis   Social History:   reports that she has never smoked. She has never used smokeless tobacco. She reports that she drinks alcohol. She reports that she does not use illicit drugs.   REVIEW OF SYSTEMS : Negative except for see extensive problem list  Physical Exam:   There were no vitals taken for this visit. There is no weight on file to calculate BMI.  Gen:  WDWN WF NAD  Neurological: Alert and oriented to person, place, and time. Motor and sensory function is grossly intact  Head: Normocephalic and atraumatic.  Eyes: Conjunctivae are normal. Pupils are equal, round, and reactive to light. No scleral icterus.  Neck: Normal range of motion. Neck supple. No tracheal deviation or thyromegaly present.  Cardiovascular:  SR without murmurs or gallops.  No carotid bruits Breast:  Not examined Respiratory: Effort normal.  No respiratory distress. No chest wall tenderness. Breath sounds normal.  No  wheezes, rales or rhonchi.  Abdomen:  Soft palpable mass to the right of the midline consistent with ventral hernia GU:  Not examined Musculoskeletal: Normal range of motion. Extremities are nontender. No cyanosis, edema or clubbing noted Lymphadenopathy: No cervical, preauricular, postauricular or axillary adenopathy is present Skin: Skin is warm and dry. No rash noted. No diaphoresis. No erythema. No pallor. Pscyh: Normal mood and affect. Behavior is normal. Judgment and thought content normal.   LABORATORY RESULTS: No results found for this or any previous visit (from the past 48 hour(s)).   RADIOLOGY RESULTS: No results found.  Problem List: Patient Active Problem List   Diagnosis Date Noted  . Colitis 09/14/2015  . Sepsis ([place]) 09/14/2015  . Paresthesia 03/23/2015  . Otitis, externa, infective 03/13/2015  . Hyponatremia 03/13/2015  . Cystitis 09/10/2014  . Headache(784.0) 08/21/2014  . Low back pain 06/09/2014  . Anemia 10/06/2013  . Medicare annual wellness visit, subsequent 10/06/2013  . UTI (urinary tract infection) 04/02/2013  . Asthma, mild intermittent 04/02/2013  . Internal hemorrhoid, bleeding 01/23/2013  . Diverticulitis of rectosigmoid 11/28/2012  . Colonic diverticular abscess 11/21/2012  . Diverticulitis 10/25/2012  . Abnormal cervical cytology 10/25/2012  . Cancer ([place])   . Insomnia   . Thyroid disease   . Constipation   . Cough 09/02/2012  . Chest pain 08/05/2011  . Hyperlipidemia, mixed 11/08/2010  . Essential hypertension 11/08/2010  . G E R D 11/08/2010  . Obstructive sleep apnea 11/04/2010  . ALLERGIC RHINITIS 11/04/2010    Assessment & Plan: Ventral hernia containing transverse colon and 8 cm ovarian mass.  For procedure by Dr. Denman George and me to remove the ovary and repair the ventral hernia.      Matt B. Hassell Done, MD, [facility] [phone] beeper [phone]  09/26/2015 5:20 PM

## 2015-10-10 ENCOUNTER — Ambulatory Visit (INDEPENDENT_AMBULATORY_CARE_PROVIDER_SITE_OTHER): Payer: Medicare Other | Admitting: Family Medicine

## 2015-10-10 ENCOUNTER — Encounter: Payer: Self-pay | Admitting: Family Medicine

## 2015-10-10 VITALS — BP 140/76 | HR 66 | Temp 98.4°F | Ht 62.0 in | Wt 206.1 lb

## 2015-10-10 DIAGNOSIS — R109 Unspecified abdominal pain: Secondary | ICD-10-CM | POA: Diagnosis not present

## 2015-10-10 DIAGNOSIS — A419 Sepsis, unspecified organism: Secondary | ICD-10-CM

## 2015-10-10 DIAGNOSIS — K625 Hemorrhage of anus and rectum: Secondary | ICD-10-CM | POA: Diagnosis not present

## 2015-10-10 DIAGNOSIS — K5732 Diverticulitis of large intestine without perforation or abscess without bleeding: Secondary | ICD-10-CM

## 2015-10-10 DIAGNOSIS — K648 Other hemorrhoids: Secondary | ICD-10-CM

## 2015-10-10 DIAGNOSIS — K5900 Constipation, unspecified: Secondary | ICD-10-CM

## 2015-10-10 DIAGNOSIS — K529 Noninfective gastroenteritis and colitis, unspecified: Secondary | ICD-10-CM

## 2015-10-10 DIAGNOSIS — D649 Anemia, unspecified: Secondary | ICD-10-CM | POA: Diagnosis not present

## 2015-10-10 DIAGNOSIS — K219 Gastro-esophageal reflux disease without esophagitis: Secondary | ICD-10-CM

## 2015-10-10 DIAGNOSIS — I10 Essential (primary) hypertension: Secondary | ICD-10-CM

## 2015-10-10 LAB — COMPREHENSIVE METABOLIC PANEL
ALT: 13 U/L (ref 0–35)
AST: 16 U/L (ref 0–37)
Albumin: 4.1 g/dL (ref 3.5–5.2)
Alkaline Phosphatase: 85 U/L (ref 39–117)
BUN: 12 mg/dL (ref 6–23)
CO2: 31 mEq/L (ref 19–32)
Calcium: 9.2 mg/dL (ref 8.4–10.5)
Chloride: 97 mEq/L (ref 96–112)
Creatinine, Ser: 0.7 mg/dL (ref 0.40–1.20)
GFR: 87.63 mL/min (ref >60.00)
Glucose, Bld: 97 mg/dL (ref 70–99)
Potassium: 3.9 mEq/L (ref 3.5–5.1)
Sodium: 136 mEq/L (ref 135–145)
Total Bilirubin: 0.4 mg/dL (ref 0.2–1.2)
Total Protein: 6.9 g/dL (ref 6.0–8.3)

## 2015-10-10 LAB — CBC
HCT: 35.2 % — ABNORMAL LOW (ref 36.0–46.0)
Hemoglobin: 11.8 g/dL — ABNORMAL LOW (ref 12.0–15.0)
MCHC: 33.3 g/dL (ref 30.0–36.0)
MCV: 87.2 fl (ref 78.0–100.0)
Platelets: 298 10*3/uL (ref 150.0–400.0)
RBC: 4.04 Mil/uL (ref 3.87–5.11)
RDW: 14.2 % (ref 11.5–15.5)
WBC: 7.2 10*3/uL (ref 4.0–10.5)

## 2015-10-10 LAB — SEDIMENTATION RATE: Sed Rate: 26 mm/hr — ABNORMAL HIGH (ref 0–22)

## 2015-10-10 NOTE — Patient Instructions (Signed)
Ventral Hernia A ventral hernia (also called an incisional hernia) is a hernia that occurs at the site of a previous surgical cut (incision) in the abdomen. The abdominal wall spans from your lower chest down to your pelvis. If the abdominal wall is weakened from a surgical incision, a hernia can occur. A hernia is a bulge of bowel or muscle tissue pushing out on the weakened part of the abdominal wall. Ventral hernias can get bigger from straining or lifting. Obese and older people are at higher risk for a ventral hernia. People who develop infections after surgery or require repeat incisions at the same site on the abdomen are also at increased risk. CAUSES  A ventral hernia occurs because of weakness in the abdominal wall at an incision site.  SYMPTOMS  Common symptoms include:  A visible bulge or lump on the abdominal wall.  Pain or tenderness around the lump.  Increased discomfort if you cough or make a sudden movement. If the hernia has blocked part of the intestine, a serious complication can occur (incarcerated or strangulated hernia). This can become a problem that requires emergency surgery because the blood flow to the blocked intestine may be cut off. Symptoms may include:  Feeling sick to your stomach (nauseous).  Throwing up (vomiting).  Stomach swelling (distention) or bloating.  Fever.  Rapid heartbeat. DIAGNOSIS  Your health care provider will take a medical history and perform a physical exam. Various tests may be ordered, such as:  Blood tests.  Urine tests.  Ultrasonography.  X-rays.  Computed tomography (CT). TREATMENT  Watchful waiting may be all that is needed for a smaller hernia that does not cause symptoms. Your health care provider may recommend the use of a supportive belt (truss) that helps to keep the abdominal wall intact. For larger hernias or those that cause pain, surgery to repair the hernia is usually recommended. If a hernia becomes  strangulated, emergency surgery needs to be done right away. HOME CARE INSTRUCTIONS  Avoid putting pressure or strain on the abdominal area.  Avoid heavy lifting.  Use good body positioning for physical tasks. Ask your health care provider about proper body positioning.  Use a supportive belt as directed by your health care provider.  Maintain a healthy weight.  Eat foods that are high in fiber, such as whole grains, fruits, and vegetables. Fiber helps prevent difficult bowel movements (constipation).  Drink enough fluids to keep your urine clear or pale yellow.  Follow up with your health care provider as directed. SEEK MEDICAL CARE IF:   Your hernia seems to be getting larger or more painful. SEEK IMMEDIATE MEDICAL CARE IF:   You have abdominal pain that is sudden and sharp.  Your pain becomes severe.  You have repeated vomiting.  You are sweating a lot.  You notice a rapid heartbeat.  You develop a fever. MAKE SURE YOU:   Understand these instructions.  Will watch your condition.  Will get help right away if you are not doing well or get worse.   This information is not intended to replace advice given to you by your health care provider. Make sure you discuss any questions you have with your health care provider.   Document Released: 11/15/2012 Document Revised: 12/20/2014 Document Reviewed: 11/15/2012 Elsevier Interactive Patient Education 2016 [employer]  

## 2015-10-10 NOTE — Assessment & Plan Note (Signed)
Has developed a ventral hernia at incision site and is in the process of having surgery arranged with Dr Hassell Done and Dr Denman George to correct at same time as her adnexal mass removal

## 2015-10-10 NOTE — Assessment & Plan Note (Signed)
She is doing better since her recent hospitalization, she tolerated her antibiotics and she has not seen any further blood in stool since returning home. She will call if symptoms worsen and she is advised to present to ED if bleeding recurs and does not resolve

## 2015-10-10 NOTE — Progress Notes (Signed)
Pre visit review using our clinic review tool, if applicable. No additional management support is needed unless otherwise documented below in the visit note. 

## 2015-10-10 NOTE — Progress Notes (Signed)
Subjective:    Patient ID: Jackie Stevens, female    DOB: 18-Mar-1946, 69 y.o.   MRN: [id number]  Chief Complaint  Patient presents with  . Hospitalization Follow-up    HPI Patient is in today for hospital follow-up. She was hospitalized with rectal bleeding, nausea, vomiting and watery diarrhea. After treatment she is feeling much better. She continues to struggle with malaise, myalgias and some fatigue but no fevers. Some abdominal pain is still present but greatly improved. No bloody or tarry stools since returning home. Acknowledges some anhedonia but denies suicidal ideation. Is trying to maintain a bland heart healthy diet. Denies CP/palp/SOB/HA/congestion/fevers or GU c/o. Taking meds as prescribed  Past Medical History  Diagnosis Date  . Allergic rhinitis   . GERD (gastroesophageal reflux disease)   . Hyperlipidemia   . Chicken pox as a child  . Measles as a child  . Insomnia   . Thyroid disease   . Constipation   . Diverticulitis 10/25/2012    pt. reports that a drain was placed - 09/2012    . Abnormal cervical cytology 10/25/2012    Follows with Dr Leavy Cella of Gyn  . Asthma   . Heart murmur   . Complication of anesthesia   . PONV (postoperative nausea and vomiting)   . HTN (hypertension)     stress test completed by Anselm Lis, diagnosed as GERD  . Thyroid cancer ([place]) 1980's  . Hypothyroidism   . H/O hiatal hernia   . External hemorrhoid, bleeding     "sometimes" (01-05-13)  . Arthritis     "knees; right thumb; shoulders" (Jan 05, 2013)  . Kidney stones 1970's    "passed on their own" (2013/01/05)  . OSA on CPAP   . UTI (urinary tract infection) 04/02/2013  . Anemia 10/06/2013  . Preventative health care 10/06/2013    [facility] Pneumovax in 2012  . Low back pain 06/09/2014  . Medicare annual wellness visit, subsequent 10/06/2013    [facility] Pneumovax in 2012   . Paresthesia 03/23/2015    Left face    Past Surgical  History  Procedure Laterality Date  . Thyroidectomy, partial  1988    "then did iodine to remove the rest" (05-Jan-2013)  . Sigmoid resection / rectopexy  01/05/2013  . Colon surgery    . Tonsillectomy  1951?  Marland Kitchen Cholecystectomy  1990  . Vaginal hysterectomy  1970's    "still have my ovaries" (01-05-2013)  . Dilation and curettage of uterus  1960's    "lots of them; had miscarriages" (01/05/13)  . Transrectal drainage of pelvic abscess  10/27/2012  . Colostomy revision  Jan 05, 2013    Procedure: COLON RESECTION SIGMOID;  Surgeon: Gwenyth Ober, MD;  Location: [facility];  Service: General;  Laterality: N/A;  . Cystoscopy with stent placement  01/05/2013    Procedure: CYSTOSCOPY WITH STENT PLACEMENT;  Surgeon: Hanley Ben, MD;  Location: [facility];  Service: Urology;  Laterality: N/A;    Family History  Problem Relation Age of Onset  . Heart disease Father   . Pneumonia Father   . Hypertension Father   . Hyperlipidemia Father   . Cancer Father     skin  . Stroke Father   . Cancer Paternal Aunt   . Alzheimer's disease Mother   . Heart disease Mother   . Emphysema Brother     marijuana and cigarettes  . Alcohol abuse Brother   . Hearing loss Brother   . Diabetes  Maternal Grandmother   . Alzheimer's disease Paternal Grandmother   . Cancer Paternal Grandmother     lung?- smoker  . Hyperlipidemia Paternal Grandmother   . Heart attack Paternal Grandfather   . Alcohol abuse Paternal Grandfather   . Neurofibromatosis Son     schwanomatosis  . Neurofibromatosis Son     swanomatosis    Social History   Social History  . Marital Status: Married    Spouse Name: N/A  . Number of Children: N/A  . Years of Education: N/A   Occupational History  . Not on file.   Social History Main Topics  . Smoking status: Never Smoker   . Smokeless tobacco: Never Used  . Alcohol Use: 0.0 oz/week    0 Standard drinks or equivalent per week     Comment: 12/19/2012 "glass of wine q night q other month or  so"  . Drug Use: No  . Sexual Activity: No   Other Topics Concern  . Not on file   Social History Narrative   Married    children    Outpatient Prescriptions Prior to Visit  Medication Sig Dispense Refill  . albuterol (PROVENTIL HFA;VENTOLIN HFA) 108 (90 BASE) MCG/ACT inhaler Inhale 2 puffs into the lungs every 6 (six) hours as needed for wheezing. 1 Inhaler 2  . cetirizine (ZYRTEC) 10 MG tablet Take 1 tablet (10 mg total) by mouth daily. 30 tablet 11  . fluticasone (FLONASE) 50 MCG/ACT nasal spray Place 2 sprays into both nostrils daily. 16 g 2  . hydrochlorothiazide (HYDRODIURIL) 25 MG tablet Take 1 tablet (25 mg total) by mouth daily. 90 tablet 2  . KRILL OIL PO Take 500 mg by mouth daily.     Marland Kitchen levothyroxine (SYNTHROID, LEVOTHROID) 150 MCG tablet Take 150-225 mcg by mouth daily. 1 tablet (150 mcg) every day except Sunday, 1.5 tablet (225 mcg) on Sunday    . NITROSTAT 0.4 MG SL tablet PLACE 1 TABLET UNDER THE TONGUE EVERY 5 MINUTES AS NEEDED FOR CHEST PAIN 25 tablet 0  . nystatin (MYCOSTATIN) 100000 UNIT/ML suspension Take 5 mLs (500,000 Units total) by mouth 4 (four) times daily -  before meals and at bedtime. Until resolved 60 mL 2  . omeprazole (PRILOSEC) 20 MG capsule TAKE 1 CAPSULE EVERY MORNING AT BREAFAST 90 capsule 2  . Polyethyl Glycol-Propyl Glycol 0.4-0.3 % GEL Apply 1 drop to eye at bedtime.    . Probiotic Product (PROBIOTIC ADVANCED PO) Take 2 tablets by mouth daily. Digestive Advantage    . psyllium (REGULOID) 0.52 G capsule Take 0.52 g by mouth at bedtime.    . ranitidine (ZANTAC) 150 MG tablet TAKE 1 TABLET BY MOUTH AT BEDTIME 90 tablet 0  . simvastatin (ZOCOR) 40 MG tablet TAKE 1 TABLET (40 MG TOTAL) BY MOUTH AT BEDTIME. 90 tablet 2  . sodium chloride (OCEAN) 0.65 % SOLN nasal spray Place 1 spray into both nostrils at bedtime.    . traMADol (ULTRAM) 50 MG tablet Take 1 tablet (50 mg total) by mouth every 6 (six) hours as needed for moderate pain. 30 tablet 0  .  TURMERIC PO Take 1 tablet by mouth daily.     . valsartan (DIOVAN) 320 MG tablet Take 1 tablet (320 mg total) by mouth daily. 90 tablet 2  . vitamin B-12 (CYANOCOBALAMIN) 1000 MCG tablet Take 2,500 mcg by mouth daily.    . Vitamin D, Cholecalciferol, 1000 UNITS CAPS Take 1 capsule by mouth daily.    Marland Kitchen amoxicillin-clavulanate (AUGMENTIN)  875-125 MG tablet Take 1 tablet by mouth 2 (two) times daily. X 10days 20 tablet 0  . clobetasol cream (TEMOVATE) 7.42 % Apply 1 application topically 2 (two) times daily.   0  . hydrOXYzine (ATARAX/VISTARIL) 10 MG tablet Take 1 tablet (10 mg total) by mouth every 8 (eight) hours as needed. (Patient taking differently: Take 10 mg by mouth every 8 (eight) hours as needed for itching. ) 15 tablet 0  . ondansetron (ZOFRAN ODT) 4 MG disintegrating tablet Take 1 tablet (4 mg total) by mouth every 6 (six) hours as needed for nausea or vomiting. 30 tablet 0  . senna (SENOKOT) 8.6 MG TABS tablet Take 1 tablet by mouth daily as needed for mild constipation.    . valACYclovir (VALTREX) 1000 MG tablet Take 1 tablet (1,000 mg total) by mouth 3 (three) times daily. 21 tablet 0   No facility-administered medications prior to visit.    Allergies  Allergen Reactions  . Niacin Other (See Comments) and Cough    "cough til I threw up" (12/19/2012)  . Neomycin-Bacitracin Zn-Polymyx Rash    Polysporin- is tolerated   . Ciprofloxacin Hives    Got cipro and flagyl at same time, localized hives to IV arm  . Flagyl [Metronidazole] Hives    Got cipro and flagyl at same time, localized hives to IV arm    Review of Systems  Constitutional: Positive for malaise/fatigue. Negative for fever.  HENT: Negative for congestion.   Eyes: Negative for discharge.  Respiratory: Negative for shortness of breath.   Cardiovascular: Negative for chest pain, palpitations and leg swelling.  Gastrointestinal: Positive for abdominal pain. Negative for nausea.  Genitourinary: Negative for dysuria.    Musculoskeletal: Negative for falls.  Skin: Negative for rash.  Neurological: Negative for loss of consciousness and headaches.  Endo/Heme/Allergies: Negative for environmental allergies.  Psychiatric/Behavioral: Negative for depression. The patient is nervous/anxious.        Objective:    Physical Exam  Constitutional: She is oriented to person, place, and time. She appears well-developed and well-nourished. No distress.  HENT:  Head: Normocephalic and atraumatic.  Nose: Nose normal.  Eyes: Right eye exhibits no discharge. Left eye exhibits no discharge.  Neck: Normal range of motion. Neck supple.  Cardiovascular: Normal rate and regular rhythm.   Pulmonary/Chest: Effort normal and breath sounds normal.  Abdominal: Soft. Bowel sounds are normal. There is tenderness.  LLQ tender with palp  Musculoskeletal: She exhibits no edema.  Neurological: She is alert and oriented to person, place, and time.  Skin: Skin is warm and dry.  Psychiatric: She has a normal mood and affect.  Nursing note and vitals reviewed.   BP 140/76 mmHg  Pulse 66  Temp(Src) 98.4 F (36.9 C) (Oral)  Ht _0  (1.575 m)  Wt 206 lb 2 oz (93.498 kg)  BMI 37.69 kg/m2  SpO2 99% Wt Readings from Last 3 Encounters:  10/10/15 206 lb 2 oz (93.498 kg)  09/16/15 207 lb 10.8 oz (94.2 kg)  09/09/15 209 lb 6 oz (94.972 kg)     Lab Results  Component Value Date   WBC 7.9 09/17/2015   HGB 9.7* 09/17/2015   HCT 29.1* 09/17/2015   PLT 208 09/17/2015   GLUCOSE 108* 09/17/2015   CHOL 144 11/01/2014   TRIG 143.0 11/01/2014   HDL 44.30 11/01/2014   LDLCALC 71 11/01/2014   ALT 22 09/14/2015   AST 20 09/14/2015   NA 134* 09/17/2015   K 3.5 09/17/2015  CL 101 09/17/2015   CREATININE 0.76 09/17/2015   BUN <5* 09/17/2015   CO2 26 09/17/2015   TSH 0.14* 09/09/2015   INR 0.99 03/16/2015   HGBA1C 5.8* 07/18/2011    Lab Results  Component Value Date   TSH 0.14* 09/09/2015   Lab Results  Component Value  Date   WBC 7.9 09/17/2015   HGB 9.7* 09/17/2015   HCT 29.1* 09/17/2015   MCV 87.9 09/17/2015   PLT 208 09/17/2015   Lab Results  Component Value Date   NA 134* 09/17/2015   K 3.5 09/17/2015   CO2 26 09/17/2015   GLUCOSE 108* 09/17/2015   BUN <5* 09/17/2015   CREATININE 0.76 09/17/2015   BILITOT 0.8 09/14/2015   ALKPHOS 82 09/14/2015   AST 20 09/14/2015   ALT 22 09/14/2015   PROT 6.0* 09/14/2015   ALBUMIN 3.2* 09/14/2015   CALCIUM 7.8* 09/17/2015   ANIONGAP 7 09/17/2015   GFR 78.52 09/09/2015   Lab Results  Component Value Date   CHOL 144 11/01/2014   Lab Results  Component Value Date   HDL 44.30 11/01/2014   Lab Results  Component Value Date   LDLCALC 71 11/01/2014   Lab Results  Component Value Date   TRIG 143.0 11/01/2014   Lab Results  Component Value Date   CHOLHDL 3 11/01/2014   Lab Results  Component Value Date   HGBA1C 5.8* 07/18/2011       Assessment & Plan:   Problem List Items Addressed This Visit    Essential hypertension    Well controlled, no changes to meds. Encouraged heart healthy diet such as the DASH diet and exercise as tolerated.       Diverticulitis of rectosigmoid    Has developed a ventral hernia at incision site and is in the process of having surgery arranged with Dr Hassell Done and Dr Denman George to correct at same time as her adnexal mass removal      Constipation    Moving between small, firm stool and loose stool but moving daily with combination of prunes, probiotics and Docusate. May consider adding low dose Miralax, it is imperative we keep her bowels moving easily until she is able to have her surgery.       Colitis    She is doing better since her recent hospitalization, she tolerated her antibiotics and she has not seen any further blood in stool since returning home. She will call if symptoms worsen and she is advised to present to ED if bleeding recurs and does not resolve      Anemia    Check CBC      Relevant Orders    CBC   Comprehensive metabolic panel   Sed Rate (ESR)    Other Visit Diagnoses    Rectal bleeding    -  Primary    Relevant Orders    CBC    Comprehensive metabolic panel    Sed Rate (ESR)    Abdominal pain, unspecified abdominal location        Relevant Orders    CBC    Comprehensive metabolic panel    Sed Rate (ESR)       I have discontinued Ms. Burnett's senna, hydrOXYzine, clobetasol cream, valACYclovir, amoxicillin-clavulanate, and ondansetron. I am also having her maintain her levothyroxine, psyllium, albuterol, cetirizine, fluticasone, NITROSTAT, KRILL OIL PO, TURMERIC PO, vitamin B-12, Polyethyl Glycol-Propyl Glycol, sodium chloride, hydrochlorothiazide, valsartan, simvastatin, omeprazole, ranitidine, Probiotic Product (PROBIOTIC ADVANCED PO), Vitamin D (Cholecalciferol), nystatin, traMADol, and  docusate sodium.  Meds ordered this encounter  Medications  . docusate sodium (COLACE) 100 MG capsule    Sig: Take 100 mg by mouth 2 (two) times daily.     Penni Homans, MD

## 2015-10-10 NOTE — Assessment & Plan Note (Signed)
Well controlled, no changes to meds. Encouraged heart healthy diet such as the DASH diet and exercise as tolerated.  °

## 2015-10-10 NOTE — Assessment & Plan Note (Signed)
Check CBC 

## 2015-10-10 NOTE — Assessment & Plan Note (Signed)
Moving between small, firm stool and loose stool but moving daily with combination of prunes, probiotics and Docusate. May consider adding low dose Miralax, it is imperative we keep her bowels moving easily until she is able to have her surgery.

## 2015-10-16 ENCOUNTER — Other Ambulatory Visit: Payer: Self-pay | Admitting: Family Medicine

## 2015-10-24 ENCOUNTER — Other Ambulatory Visit: Payer: Self-pay | Admitting: Family Medicine

## 2015-10-24 ENCOUNTER — Telehealth: Payer: Self-pay | Admitting: Gynecologic Oncology

## 2015-10-24 NOTE — Telephone Encounter (Signed)
Spoke with the patient and informed her that we are waiting to hear from Dr. Earlie Server office about scheduling her joint surgery.  Advised she will be contacted when more information is available.  No concerns voiced.  Advised to call for any needs.

## 2015-10-26 NOTE — Assessment & Plan Note (Signed)
Avoid offending foods, start probiotics. Do not eat large meals in late evening and consider raising head of bed.  

## 2015-10-26 NOTE — Assessment & Plan Note (Signed)
Feeling much better, no fevers or acute concerns today but still struggling to get her strength back. Encouraged heart healthy diet, sleep 6-8 hours, adequate hydration and activity as tolerated. Report any recurrent symptoms

## 2015-10-26 NOTE — Assessment & Plan Note (Signed)
Hospitalized with rectal bleeding, n/v and watery diarrhea. Is doing much better now. No bloody or tarry stool. Diarrhea improving. Encouraged fiber supplements and probiotics.

## 2015-10-28 ENCOUNTER — Telehealth: Payer: Self-pay | Admitting: Gynecologic Oncology

## 2015-10-28 NOTE — Telephone Encounter (Signed)
Called to inform the patient of her possible surgical date with Dr. Hassell Done and Dr. Denman George on Dec 20.  Patient stating she will have to talk with her husband and she will call back with a decision.

## 2015-10-28 NOTE — Telephone Encounter (Signed)
Patient called and stated she is good to go for surgery on Dec 20 with Dr. Denman George and Dr. Hassell Done.  Advised she would receive a phone call from pre-surgical testing at the hospital to come in for a pre-op appt.  Advised patient that she would be asked to sign a blood consent.  Counseled on this.  Patient stating she takes a baby aspirin once daily.  Advised we would call her back with Dr. Serita Grit recommendations about her aspirin.

## 2015-10-29 ENCOUNTER — Telehealth: Payer: Self-pay

## 2015-10-29 NOTE — Telephone Encounter (Signed)
Orders received to contact the patient to stop her baby aspirin  D/T her upcoming surgery scheduled for December 20 ,2016 with Dr Everitt Amber . No answer , left a detailed message with call back information if additional questions or concerns arise.

## 2015-11-25 ENCOUNTER — Ambulatory Visit (HOSPITAL_COMMUNITY)
Admission: RE | Admit: 2015-11-25 | Discharge: 2015-11-25 | Disposition: A | Discharge status: Home / Self Care | Payer: Medicare Other | Source: Ambulatory Visit | Attending: Anesthesiology | Admitting: Anesthesiology

## 2015-11-25 ENCOUNTER — Encounter (HOSPITAL_COMMUNITY)
Admission: RE | Admit: 2015-11-25 | Discharge: 2015-11-25 | Disposition: A | Discharge status: Home / Self Care | Payer: Medicare Other | Source: Ambulatory Visit | Attending: Surgery | Admitting: Surgery

## 2015-11-25 ENCOUNTER — Encounter (HOSPITAL_COMMUNITY): Payer: Self-pay

## 2015-11-25 DIAGNOSIS — R9389 Abnormal findings on diagnostic imaging of other specified body structures: Secondary | ICD-10-CM

## 2015-11-25 DIAGNOSIS — Z01812 Encounter for preprocedural laboratory examination: Secondary | ICD-10-CM | POA: Diagnosis not present

## 2015-11-25 DIAGNOSIS — Z01818 Encounter for other preprocedural examination: Secondary | ICD-10-CM | POA: Insufficient documentation

## 2015-11-25 DIAGNOSIS — R938 Abnormal findings on diagnostic imaging of other specified body structures: Secondary | ICD-10-CM | POA: Insufficient documentation

## 2015-11-25 HISTORY — DX: Unspecified urinary incontinence: R32

## 2015-11-25 HISTORY — DX: Anxiety disorder, unspecified: F41.9

## 2015-11-25 HISTORY — DX: Angina pectoris, unspecified: I20.9

## 2015-11-25 HISTORY — DX: Reserved for inherently not codable concepts without codable children: IMO0001

## 2015-11-25 LAB — BASIC METABOLIC PANEL
Anion gap: 8 (ref 5–15)
BUN: 16 mg/dL (ref 6–20)
CO2: 29 mmol/L (ref 22–32)
Calcium: 8.9 mg/dL (ref 8.9–10.3)
Chloride: 100 mmol/L — ABNORMAL LOW (ref 101–111)
Creatinine, Ser: 0.69 mg/dL (ref 0.44–1.00)
GFR calc Af Amer: >60 mL/min (ref >60)
GFR calc non Af Amer: >60 mL/min (ref >60)
Glucose, Bld: 97 mg/dL (ref 65–99)
Potassium: 4.1 mmol/L (ref 3.5–5.1)
Sodium: 137 mmol/L (ref 135–145)

## 2015-11-25 LAB — ABO/RH: ABO/RH(D): A POS

## 2015-11-25 LAB — CBC
HCT: 35.9 % — ABNORMAL LOW (ref 36.0–46.0)
Hemoglobin: 11.7 g/dL — ABNORMAL LOW (ref 12.0–15.0)
MCH: 28.7 pg (ref 26.0–34.0)
MCHC: 32.6 g/dL (ref 30.0–36.0)
MCV: 88 fL (ref 78.0–100.0)
Platelets: 282 10*3/uL (ref 150–400)
RBC: 4.08 MIL/uL (ref 3.87–5.11)
RDW: 13.1 % (ref 11.5–15.5)
WBC: 7 10*3/uL (ref 4.0–10.5)

## 2015-11-25 NOTE — Progress Notes (Signed)
EKG / epic 03/17/2015 discussed with Dr Vicenta Aly. No orders noted. Anesthesia to see pt day of surgery. Stress test epic 07/27/2011

## 2015-11-25 NOTE — Patient Instructions (Addendum)
Jackie Stevens  11/25/2015   Your procedure is scheduled on: Tuesday December 02, 2015  Report to [facility] Main  Entrance take [place]  elevators to 3rd floor to  [place] at 11:00 AM.  Call this number if you have problems the morning of surgery [phone]   Remember: ONLY 1 PERSON MAY GO WITH YOU TO SHORT STAY TO GET  READY MORNING OF [name].  CLEAR LIQUID DIET (per Dr Denman George instruction) beginning day prior to surgery; can continue clear liquid diet till 7:00 am day of surgery then nothing by mouth. No carbonated beverages.     Take these medicines the morning of surgery with A SIP OF WATER: Cetirizine (Zyrtec) if needed; levothyroxine; Omeprazole (Prilosec); May use flonase if needed (bring with you day of surgery); Nasal spray if needed (bring with you day of surgery); May use albuterol inhaler if needed (bring with you day of surgery)                                      DISCONTINUE ASPIRIN AN ANY HERBAL MEDICATIONS 5 DAYS PRIOR TO SURGERY               You may not have any metal on your body including hair pins and              piercings  Do not wear jewelry, make-up, lotions, powders or perfumes, deodorant             Do not wear nail polish.  Do not shave  48 hours prior to surgery.               Do not bring valuables to the hospital. [name].  Contacts, dentures or bridgework may not be worn into surgery.  Leave suitcase in the car. After surgery it may be brought to your room.    Special Instructions: BRING CPAP MASK AND TUBING WITH YOU DAY OF SURGERY; ALSO FOLLOW SURGEON'S INSTRUCTION IN REGARDS TO BOWEL PROGRAM           _____________________________________________________________________             [name] - Preparing for Surgery Before surgery, you can play an important role.  Because skin is not sterile, your skin needs to be as free of germs as possible.  You can reduce  the number of germs on your skin by washing with CHG (chlorahexidine gluconate) soap before surgery.  CHG is an antiseptic cleaner which kills germs and bonds with the skin to continue killing germs even after washing. Please DO NOT use if you have an allergy to CHG or antibacterial soaps.  If your skin becomes reddened/irritated stop using the CHG and inform your nurse when you arrive at Short Stay. Do not shave (including legs and underarms) for at least 48 hours prior to the first CHG shower.  You may shave your face/neck. Please follow these instructions carefully:  1.  Shower with CHG Soap the night before surgery and the  morning of Surgery.  2.  If you choose to wash your hair, wash your hair first as usual with your  normal  shampoo.  3.  After you shampoo, rinse your  hair and body thoroughly to remove the  shampoo.                           4.  Use CHG as you would any other liquid soap.  You can apply chg directly  to the skin and wash                       Gently with a scrungie or clean washcloth.  5.  Apply the CHG Soap to your body ONLY FROM THE NECK DOWN.   Do not use on face/ open                           Wound or open sores. Avoid contact with eyes, ears mouth and genitals (private parts).                       Wash face,  Genitals (private parts) with your normal soap.             6.  Wash thoroughly, paying special attention to the area where your surgery  will be performed.  7.  Thoroughly rinse your body with warm water from the neck down.  8.  DO NOT shower/wash with your normal soap after using and rinsing off  the CHG Soap.                9.  Pat yourself dry with a clean towel.            10.  Wear clean pajamas.            11.  Place clean sheets on your bed the night of your first shower and do not  sleep with pets. Day of Surgery : Do not apply any lotions/deodorants the morning of surgery.  Please wear clean clothes to the hospital/surgery center.  FAILURE TO FOLLOW  THESE INSTRUCTIONS MAY RESULT IN THE CANCELLATION OF YOUR SURGERY PATIENT SIGNATURE_________________________________  NURSE SIGNATURE__________________________________  ________________________________________________________________________    CLEAR LIQUID DIET   Foods Allowed                                                                     Foods Excluded  Coffee and tea, regular and decaf                             liquids that you cannot  Plain Jell-O in any flavor                                             see through such as: Fruit ices (not with fruit pulp)                                     milk, soups, orange juice  Iced Popsicles  All solid food                                    Cranberry, grape and apple juices Sports drinks like Gatorade Lightly seasoned clear broth or consume(fat free) Sugar, honey syrup  Sample Menu Breakfast                                Lunch                                     Supper Cranberry juice                    Beef broth                            Chicken broth Jell-O                                     Grape juice                           Apple juice Coffee or tea                        Jell-O                                      Popsicle                                                Coffee or tea                        Coffee or tea  _____________________________________________________________________   WHAT IS A BLOOD TRANSFUSION? Blood Transfusion Information  A transfusion is the replacement of blood or some of its parts. Blood is made up of multiple cells which provide different functions.  Red blood cells carry oxygen and are used for blood loss replacement.  White blood cells fight against infection.  Platelets control bleeding.  Plasma helps clot blood.  Other blood products are available for specialized needs, such as hemophilia or other clotting disorders. BEFORE THE  TRANSFUSION  Who gives blood for transfusions?   Healthy volunteers who are fully evaluated to make sure their blood is safe. This is blood bank blood. Transfusion therapy is the safest it has ever been in the practice of medicine. Before blood is taken from a donor, a complete history is taken to make sure that person has no history of diseases nor engages in risky social behavior (examples are intravenous drug use or sexual activity with multiple partners). The donor's travel history is screened to minimize risk of transmitting infections, such as malaria. The donated blood is tested for signs of infectious diseases, such as HIV and hepatitis. The blood is then tested to be sure it is compatible with you in order to  minimize the chance of a transfusion reaction. If you or a relative donates blood, this is often done in anticipation of surgery and is not appropriate for emergency situations. It takes many days to process the donated blood. RISKS AND COMPLICATIONS Although transfusion therapy is very safe and saves many lives, the main dangers of transfusion include:   Getting an infectious disease.  Developing a transfusion reaction. This is an allergic reaction to something in the blood you were given. Every precaution is taken to prevent this. The decision to have a blood transfusion has been considered carefully by your caregiver before blood is given. Blood is not given unless the benefits outweigh the risks. AFTER THE TRANSFUSION  Right after receiving a blood transfusion, you will usually feel much better and more energetic. This is especially true if your red blood cells have gotten low (anemic). The transfusion raises the level of the red blood cells which carry oxygen, and this usually causes an energy increase.  The nurse administering the transfusion will monitor you carefully for complications. HOME CARE INSTRUCTIONS  No special instructions are needed after a transfusion. You may find  your energy is better. Speak with your caregiver about any limitations on activity for underlying diseases you may have. SEEK MEDICAL CARE IF:   Your condition is not improving after your transfusion.  You develop redness or irritation at the intravenous (IV) site. SEEK IMMEDIATE MEDICAL CARE IF:  Any of the following symptoms occur over the next 12 hours:  Shaking chills.  You have a temperature by mouth above 102 F (38.9 C), not controlled by medicine.  Chest, back, or muscle pain.  People around you feel you are not acting correctly or are confused.  Shortness of breath or difficulty breathing.  Dizziness and fainting.  You get a rash or develop hives.  You have a decrease in urine output.  Your urine turns a dark color or changes to pink, red, or brown. Any of the following symptoms occur over the next 10 days:  You have a temperature by mouth above 102 F (38.9 C), not controlled by medicine.  Shortness of breath.  Weakness after normal activity.  The white part of the eye turns yellow (jaundice).  You have a decrease in the amount of urine or are urinating less often.  Your urine turns a dark color or changes to pink, red, or brown. Document Released: 11/26/2000 Document Revised: 02/21/2012 Document Reviewed: 07/15/2008 [facility] Patient Information 2014 [place], [place].  _______________________________________________________________________

## 2015-12-01 NOTE — H&P (Signed)
Chief Complaint: Ventral hernia and cystic ovary.  History of Present Illness: Jackie Stevens is an 69 y.o. female with a ventral hernia containing her transverse colon. She has been evaluated by Dr. Denman George and has an 8 cm ovarian cyst with a normal CA 125. It is thought to be benign but with the concern by its size and the need for resection Dr. Denman George felt that perhaps we should combine cystectomy with ventral hernia repair. I reviewed a recent CT scan where her transverse colon is up in this incisional hernia from Dr. Richarda Blade prior bowel resection. I would try to get in laparoscopically and the upper abdomen and see how severe the adhesions are and tried to do some more all of this laparoscopically. If not then we'll need to open and created a pathway for Dr. Denman George to remove her ovary and then try to go ahead and fix her ventral hernia. We will do a bowel prep preop. Have seen her and explaining this to her and her family. We'll try to coordinate Dr. Denman George scheduled mind to move forward with combined ventral hernia repair and removal of ovary.   Past Medical History  Diagnosis Date  . Allergic rhinitis   . GERD (gastroesophageal reflux disease)   . Hyperlipidemia   . Chicken pox as a child  . Measles as a child  . Insomnia   . Thyroid disease   . Constipation   . Diverticulitis 10/25/2012    pt. reports that a drain was placed - 09/2012   . Abnormal cervical cytology 10/25/2012    Follows with Dr Leavy Cella of Gyn  . Asthma   . Heart murmur   . Complication of anesthesia   . PONV (postoperative nausea and vomiting)   . HTN (hypertension)     stress test completed by Anselm Lis, diagnosed as GERD  . Thyroid cancer ([place]) 1980's  . Hypothyroidism   . H/O hiatal hernia   . External hemorrhoid, bleeding     "sometimes" (01-04-2013)  . Arthritis     "knees; right thumb; shoulders" (Jan 04, 2013)  .  Kidney stones 1970's    "passed on their own" (2013/01/04)  . OSA on CPAP   . UTI (urinary tract infection) 04/02/2013  . Anemia 10/06/2013  . Preventative health care 10/06/2013    [facility] Pneumovax in 2012  . Low back pain 06/09/2014  . Medicare annual wellness visit, subsequent 10/06/2013    [facility] Pneumovax in 2012   . Paresthesia 03/23/2015    Left face    Past Surgical History  Procedure Laterality Date  . Thyroidectomy, partial  1988    "then did iodine to remove the rest" (2013-01-04)  . Sigmoid resection / rectopexy  2013/01/04  . Colon surgery    . Tonsillectomy  1951?  Marland Kitchen Cholecystectomy  1990  . Vaginal hysterectomy  1970's    "still have my ovaries" (04-Jan-2013)  . Dilation and curettage of uterus  1960's    "lots of them; had miscarriages" (01/04/13)  . Transrectal drainage of pelvic abscess  10/27/2012  . Colostomy revision  2013/01/04    Procedure: COLON RESECTION SIGMOID; Surgeon: Gwenyth Ober, MD; Location: [facility]; Service: General; Laterality: N/A;  . Cystoscopy with stent placement  2013-01-04    Procedure: CYSTOSCOPY WITH STENT PLACEMENT; Surgeon: Hanley Ben, MD; Location: [facility]; Service: Urology; Laterality: N/A;    Current Outpatient Prescriptions  Medication Sig Dispense Refill  . albuterol (PROVENTIL HFA;VENTOLIN HFA) 108 (90 BASE)  MCG/ACT inhaler Inhale 2 puffs into the lungs every 6 (six) hours as needed for wheezing. 1 Inhaler 2  . amoxicillin-clavulanate (AUGMENTIN) 875-125 MG tablet Take 1 tablet by mouth 2 (two) times daily. X 10days 20 tablet 0  . cetirizine (ZYRTEC) 10 MG tablet Take 1 tablet (10 mg total) by mouth daily. 30 tablet 11  . clobetasol cream (TEMOVATE) [id number]-[id number] % Apply 1 application topically 2 (two) times daily.   0  . fluticasone (FLONASE) 50 MCG/ACT nasal spray Place 2 sprays  into both nostrils daily. 16 g 2  . hydrochlorothiazide (HYDRODIURIL) 25 MG tablet Take 1 tablet (25 mg total) by mouth daily. 90 tablet 2  . hydrOXYzine (ATARAX/VISTARIL) 10 MG tablet Take 1 tablet (10 mg total) by mouth every 8 (eight) hours as needed. (Patient taking differently: Take 10 mg by mouth every 8 (eight) hours as needed for itching. ) 15 tablet 0  . KRILL OIL PO Take 500 mg by mouth daily.     Marland Kitchen levothyroxine (SYNTHROID, LEVOTHROID) 150 MCG tablet Take 150-225 mcg by mouth daily. 1 tablet (150 mcg) every day except Sunday, 1.5 tablet (225 mcg) on Sunday    . NITROSTAT 0.4 MG SL tablet PLACE 1 TABLET UNDER THE TONGUE EVERY 5 MINUTES AS NEEDED FOR CHEST PAIN 25 tablet 0  . nystatin (MYCOSTATIN) 100000 UNIT/ML suspension Take 5 mLs (500,000 Units total) by mouth 4 (four) times daily - before meals and at bedtime. Until resolved 60 mL 2  . omeprazole (PRILOSEC) 20 MG capsule TAKE 1 CAPSULE EVERY MORNING AT BREAFAST 90 capsule 2  . ondansetron (ZOFRAN ODT) 4 MG disintegrating tablet Take 1 tablet (4 mg total) by mouth every 6 (six) hours as needed for nausea or vomiting. 30 tablet 0  . Polyethyl Glycol-Propyl Glycol 0.4-0.3 % GEL Apply 1 drop to eye at bedtime.    . Probiotic Product (PROBIOTIC ADVANCED PO) Take 2 tablets by mouth daily. Digestive Advantage    . psyllium (REGULOID) 0.52 G capsule Take 0.52 g by mouth at bedtime.    . ranitidine (ZANTAC) 150 MG tablet TAKE 1 TABLET BY MOUTH AT BEDTIME 90 tablet 0  . senna (SENOKOT) 8.6 MG TABS tablet Take 1 tablet by mouth daily as needed for mild constipation.    . simvastatin (ZOCOR) 40 MG tablet TAKE 1 TABLET (40 MG TOTAL) BY MOUTH AT BEDTIME. 90 tablet 2  . sodium chloride (OCEAN) 0.65 % SOLN nasal spray Place 1 spray into both nostrils at bedtime.    . traMADol (ULTRAM) 50 MG tablet Take 1 tablet (50 mg total) by mouth every 6 (six) hours as needed for  moderate pain. 30 tablet 0  . TURMERIC PO Take 1 tablet by mouth daily.     . valACYclovir (VALTREX) 1000 MG tablet Take 1 tablet (1,000 mg total) by mouth 3 (three) times daily. 21 tablet 0  . valsartan (DIOVAN) 320 MG tablet Take 1 tablet (320 mg total) by mouth daily. 90 tablet 2  . vitamin B-12 (CYANOCOBALAMIN) 1000 MCG tablet Take 2,500 mcg by mouth daily.    . Vitamin D, Cholecalciferol, 1000 UNITS CAPS Take 1 capsule by mouth daily.     No current facility-administered medications for this visit.   Niacin; Neomycin-bacitracin zn-polymyx; Ciprofloxacin; and Flagyl Family History  Problem Relation Age of Onset  . Heart disease Father   . Pneumonia Father   . Hypertension Father   . Hyperlipidemia Father   . Cancer Father     skin  .  Stroke Father   . Cancer Paternal Aunt   . Alzheimer's disease Mother   . Heart disease Mother   . Emphysema Brother     marijuana and cigarettes  . Alcohol abuse Brother   . Hearing loss Brother   . Diabetes Maternal Grandmother   . Alzheimer's disease Paternal Grandmother   . Cancer Paternal Grandmother     lung?- smoker  . Hyperlipidemia Paternal Grandmother   . Heart attack Paternal Grandfather   . Alcohol abuse Paternal Grandfather   . Neurofibromatosis Son     schwanomatosis  . Neurofibromatosis Son     swanomatosis   Social History:  reports that she has never smoked. She has never used smokeless tobacco. She reports that she drinks alcohol. She reports that she does not use illicit drugs.   REVIEW OF SYSTEMS : Negative except for see extensive problem list  Physical Exam:  There were no vitals taken for this visit. There is no weight on file to calculate BMI.  Gen: WDWN WF NAD  Neurological: Alert and oriented to person, place, and time. Motor and sensory function is grossly intact  Head:  Normocephalic and atraumatic.  Eyes: Conjunctivae are normal. Pupils are equal, round, and reactive to light. No scleral icterus.  Neck: Normal range of motion. Neck supple. No tracheal deviation or thyromegaly present.  Cardiovascular: SR without murmurs or gallops. No carotid bruits Breast: Not examined Respiratory: Effort normal. No respiratory distress. No chest wall tenderness. Breath sounds normal. No wheezes, rales or rhonchi.  Abdomen: Soft palpable mass to the right of the midline consistent with ventral hernia GU: Not examined Musculoskeletal: Normal range of motion. Extremities are nontender. No cyanosis, edema or clubbing noted Lymphadenopathy: No cervical, preauricular, postauricular or axillary adenopathy is present Skin: Skin is warm and dry. No rash noted. No diaphoresis. No erythema. No pallor. Pscyh: Normal mood and affect. Behavior is normal. Judgment and thought content normal.   LABORATORY RESULTS:  Lab Results Last 48 Hours    No results found for this or any previous visit (from the past 48 hour(s)).     RADIOLOGY RESULTS:  Imaging Results (Last 48 hours)    No results found.    Problem List: Patient Active Problem List   Diagnosis Date Noted  . Colitis 09/14/2015  . Sepsis ([place]) 09/14/2015  . Paresthesia 03/23/2015  . Otitis, externa, infective 03/13/2015  . Hyponatremia 03/13/2015  . Cystitis 09/10/2014  . Headache(784.0) 08/21/2014  . Low back pain 06/09/2014  . Anemia 10/06/2013  . Medicare annual wellness visit, subsequent 10/06/2013  . UTI (urinary tract infection) 04/02/2013  . Asthma, mild intermittent 04/02/2013  . Internal hemorrhoid, bleeding 01/23/2013  . Diverticulitis of rectosigmoid 11/28/2012  . Colonic diverticular abscess 11/21/2012  . Diverticulitis 10/25/2012  . Abnormal cervical cytology 10/25/2012  . Cancer ([place])   . Insomnia   . Thyroid disease   .  Constipation   . Cough 09/02/2012  . Chest pain 08/05/2011  . Hyperlipidemia, mixed 11/08/2010  . Essential hypertension 11/08/2010  . G E R D 11/08/2010  . Obstructive sleep apnea 11/04/2010  . ALLERGIC RHINITIS 11/04/2010    Assessment & Plan: Ventral hernia containing transverse colon and 8 cm ovarian mass. For procedure by Dr. Denman George and me to remove the ovary and repair the ventral hernia.     Matt B. Hassell Done, MD, [facility] [phone] beeper [phone]

## 2015-12-02 ENCOUNTER — Encounter (HOSPITAL_COMMUNITY): Payer: Self-pay | Admitting: *Deleted

## 2015-12-02 ENCOUNTER — Encounter (HOSPITAL_COMMUNITY)
Admission: RE | Disposition: A | Discharge status: Home / Self Care | Payer: Self-pay | Source: Ambulatory Visit | Attending: Surgery

## 2015-12-02 ENCOUNTER — Inpatient Hospital Stay (HOSPITAL_COMMUNITY): Payer: Medicare Other | Admitting: Anesthesiology

## 2015-12-02 ENCOUNTER — Ambulatory Visit (HOSPITAL_COMMUNITY)
Admission: RE | Admit: 2015-12-02 | Discharge: 2015-12-03 | Disposition: A | Discharge status: Home / Self Care | Payer: Medicare Other | Source: Ambulatory Visit | Attending: Surgery | Admitting: Surgery

## 2015-12-02 DIAGNOSIS — N9489 Other specified conditions associated with female genital organs and menstrual cycle: Secondary | ICD-10-CM | POA: Insufficient documentation

## 2015-12-02 DIAGNOSIS — K449 Diaphragmatic hernia without obstruction or gangrene: Secondary | ICD-10-CM | POA: Diagnosis not present

## 2015-12-02 DIAGNOSIS — Z9049 Acquired absence of other specified parts of digestive tract: Secondary | ICD-10-CM | POA: Insufficient documentation

## 2015-12-02 DIAGNOSIS — I10 Essential (primary) hypertension: Secondary | ICD-10-CM | POA: Diagnosis not present

## 2015-12-02 DIAGNOSIS — J4520 Mild intermittent asthma, uncomplicated: Secondary | ICD-10-CM | POA: Insufficient documentation

## 2015-12-02 DIAGNOSIS — Z90710 Acquired absence of both cervix and uterus: Secondary | ICD-10-CM | POA: Diagnosis not present

## 2015-12-02 DIAGNOSIS — R1900 Intra-abdominal and pelvic swelling, mass and lump, unspecified site: Secondary | ICD-10-CM | POA: Diagnosis present

## 2015-12-02 DIAGNOSIS — Z85850 Personal history of malignant neoplasm of thyroid: Secondary | ICD-10-CM | POA: Diagnosis not present

## 2015-12-02 DIAGNOSIS — E785 Hyperlipidemia, unspecified: Secondary | ICD-10-CM | POA: Diagnosis not present

## 2015-12-02 DIAGNOSIS — K660 Peritoneal adhesions (postprocedural) (postinfection): Secondary | ICD-10-CM | POA: Insufficient documentation

## 2015-12-02 DIAGNOSIS — K439 Ventral hernia without obstruction or gangrene: Secondary | ICD-10-CM

## 2015-12-02 DIAGNOSIS — E039 Hypothyroidism, unspecified: Secondary | ICD-10-CM | POA: Diagnosis not present

## 2015-12-02 DIAGNOSIS — K219 Gastro-esophageal reflux disease without esophagitis: Secondary | ICD-10-CM | POA: Insufficient documentation

## 2015-12-02 DIAGNOSIS — N83201 Unspecified ovarian cyst, right side: Secondary | ICD-10-CM

## 2015-12-02 DIAGNOSIS — Z79899 Other long term (current) drug therapy: Secondary | ICD-10-CM | POA: Diagnosis not present

## 2015-12-02 DIAGNOSIS — G4733 Obstructive sleep apnea (adult) (pediatric): Secondary | ICD-10-CM | POA: Insufficient documentation

## 2015-12-02 DIAGNOSIS — N838 Other noninflammatory disorders of ovary, fallopian tube and broad ligament: Secondary | ICD-10-CM | POA: Diagnosis present

## 2015-12-02 DIAGNOSIS — D270 Benign neoplasm of right ovary: Principal | ICD-10-CM | POA: Insufficient documentation

## 2015-12-02 HISTORY — PX: ROBOTIC ASSISTED BILATERAL SALPINGO OOPHERECTOMY: SHX6078

## 2015-12-02 HISTORY — PX: LYSIS OF ADHESION: SHX5961

## 2015-12-02 LAB — TYPE AND SCREEN
ABO/RH(D): A POS
Antibody Screen: NEGATIVE

## 2015-12-02 SURGERY — LAPAROTOMY, FOR LYSIS OF ADHESIONS
Anesthesia: General | Site: Abdomen

## 2015-12-02 MED ORDER — FLUTICASONE PROPIONATE 50 MCG/ACT NA SUSP
2.0000 | Freq: Every day | NASAL | Status: DC
  Filled 2015-12-02 (×2): qty 16

## 2015-12-02 MED ORDER — FENTANYL CITRATE (PF) 250 MCG/5ML IJ SOLN
INTRAMUSCULAR | Status: AC
  Filled 2015-12-02: qty 5

## 2015-12-02 MED ORDER — FENTANYL CITRATE (PF) 100 MCG/2ML IJ SOLN
INTRAMUSCULAR | Status: DC | PRN
  Administered 2015-12-02 (×2): 50 ug via INTRAVENOUS
  Administered 2015-12-02: 100 ug via INTRAVENOUS
  Administered 2015-12-02 (×3): 50 ug via INTRAVENOUS

## 2015-12-02 MED ORDER — PROPOFOL 10 MG/ML IV BOLUS
INTRAVENOUS | Status: DC | PRN
  Administered 2015-12-02: 200 mg via INTRAVENOUS

## 2015-12-02 MED ORDER — CEFOTETAN DISODIUM-DEXTROSE 2-2.08 GM-% IV SOLR
2.0000 g | INTRAVENOUS | Status: AC
  Administered 2015-12-02: 2 g via INTRAVENOUS

## 2015-12-02 MED ORDER — SODIUM CHLORIDE 0.9 % IJ SOLN
INTRAMUSCULAR | Status: AC
  Filled 2015-12-02: qty 10

## 2015-12-02 MED ORDER — MIDAZOLAM HCL 5 MG/5ML IJ SOLN
INTRAMUSCULAR | Status: DC | PRN
  Administered 2015-12-02: 2 mg via INTRAVENOUS

## 2015-12-02 MED ORDER — LIDOCAINE HCL (CARDIAC) 20 MG/ML IV SOLN
INTRAVENOUS | Status: DC | PRN
  Administered 2015-12-02: 100 mg via INTRAVENOUS

## 2015-12-02 MED ORDER — SUCCINYLCHOLINE CHLORIDE 20 MG/ML IJ SOLN
INTRAMUSCULAR | Status: DC | PRN
  Administered 2015-12-02: 100 mg via INTRAVENOUS

## 2015-12-02 MED ORDER — NITROGLYCERIN 0.3 MG SL SUBL
0.3000 mg | SUBLINGUAL_TABLET | SUBLINGUAL | Status: DC | PRN
  Filled 2015-12-02: qty 100

## 2015-12-02 MED ORDER — HYDROMORPHONE HCL 1 MG/ML IJ SOLN
INTRAMUSCULAR | Status: DC | PRN
  Administered 2015-12-02: .5 mg via INTRAVENOUS
  Administered 2015-12-02 (×2): .4 mg via INTRAVENOUS

## 2015-12-02 MED ORDER — KCL IN DEXTROSE-NACL 20-5-0.45 MEQ/L-%-% IV SOLN
INTRAVENOUS | Status: DC
  Administered 2015-12-02: 19:00:00 via INTRAVENOUS
  Filled 2015-12-02 (×2): qty 1000

## 2015-12-02 MED ORDER — HYDROMORPHONE HCL 1 MG/ML IJ SOLN: 0.2000 mg | INTRAMUSCULAR | Status: DC | PRN

## 2015-12-02 MED ORDER — ROCURONIUM BROMIDE 100 MG/10ML IV SOLN
INTRAVENOUS | Status: AC
  Filled 2015-12-02: qty 1

## 2015-12-02 MED ORDER — HEPARIN SODIUM (PORCINE) 5000 UNIT/ML IJ SOLN
5000.0000 [IU] | Freq: Once | INTRAMUSCULAR | Status: AC
  Administered 2015-12-02: 5000 [IU] via SUBCUTANEOUS
  Filled 2015-12-02: qty 1

## 2015-12-02 MED ORDER — LORATADINE 10 MG PO TABS
10.0000 mg | ORAL_TABLET | Freq: Every day | ORAL | Status: DC
  Filled 2015-12-02 (×3): qty 1

## 2015-12-02 MED ORDER — BUPIVACAINE LIPOSOME 1.3 % IJ SUSP
20.0000 mL | Freq: Once | INTRAMUSCULAR | Status: DC
  Filled 2015-12-02: qty 20

## 2015-12-02 MED ORDER — HYDROMORPHONE HCL 2 MG/ML IJ SOLN
INTRAMUSCULAR | Status: AC
  Filled 2015-12-02: qty 1

## 2015-12-02 MED ORDER — ALBUTEROL SULFATE (2.5 MG/3ML) 0.083% IN NEBU: 3.0000 mL | INHALATION_SOLUTION | Freq: Four times a day (QID) | RESPIRATORY_TRACT | Status: DC | PRN

## 2015-12-02 MED ORDER — LACTATED RINGERS IV SOLN: INTRAVENOUS | Status: DC

## 2015-12-02 MED ORDER — SIMVASTATIN 40 MG PO TABS
40.0000 mg | ORAL_TABLET | Freq: Every day | ORAL | Status: DC
  Administered 2015-12-02: 40 mg via ORAL
  Filled 2015-12-02 (×2): qty 1

## 2015-12-02 MED ORDER — IRBESARTAN 300 MG PO TABS
300.0000 mg | ORAL_TABLET | Freq: Every day | ORAL | Status: DC
  Filled 2015-12-02 (×2): qty 1

## 2015-12-02 MED ORDER — ROCURONIUM BROMIDE 100 MG/10ML IV SOLN
INTRAVENOUS | Status: DC | PRN
  Administered 2015-12-02: 50 mg via INTRAVENOUS
  Administered 2015-12-02: 20 mg via INTRAVENOUS

## 2015-12-02 MED ORDER — SUGAMMADEX SODIUM 200 MG/2ML IV SOLN
INTRAVENOUS | Status: DC | PRN
  Administered 2015-12-02: 500 mg via INTRAVENOUS

## 2015-12-02 MED ORDER — PROPOFOL 10 MG/ML IV BOLUS
INTRAVENOUS | Status: AC
  Filled 2015-12-02: qty 20

## 2015-12-02 MED ORDER — ONDANSETRON HCL 4 MG/2ML IJ SOLN
4.0000 mg | Freq: Four times a day (QID) | INTRAMUSCULAR | Status: DC | PRN
  Administered 2015-12-02: 4 mg via INTRAVENOUS
  Filled 2015-12-02: qty 2

## 2015-12-02 MED ORDER — OXYCODONE-ACETAMINOPHEN 5-325 MG PO TABS
1.0000 | ORAL_TABLET | ORAL | Status: DC | PRN
  Administered 2015-12-03 (×2): 1 via ORAL
  Filled 2015-12-02: qty 1
  Filled 2015-12-02: qty 2

## 2015-12-02 MED ORDER — SUGAMMADEX SODIUM 200 MG/2ML IV SOLN
INTRAVENOUS | Status: AC
  Filled 2015-12-02: qty 2

## 2015-12-02 MED ORDER — GLYCOPYRROLATE 0.2 MG/ML IJ SOLN
INTRAMUSCULAR | Status: DC | PRN
  Administered 2015-12-02: 0.2 mg via INTRAVENOUS

## 2015-12-02 MED ORDER — HYDROMORPHONE HCL 1 MG/ML IJ SOLN
0.2500 mg | INTRAMUSCULAR | Status: DC | PRN
  Administered 2015-12-02 (×4): 0.5 mg via INTRAVENOUS

## 2015-12-02 MED ORDER — MIDAZOLAM HCL 2 MG/2ML IJ SOLN
INTRAMUSCULAR | Status: AC
  Filled 2015-12-02: qty 2

## 2015-12-02 MED ORDER — DEXAMETHASONE SODIUM PHOSPHATE 10 MG/ML IJ SOLN
INTRAMUSCULAR | Status: DC | PRN
  Administered 2015-12-02: 10 mg via INTRAVENOUS

## 2015-12-02 MED ORDER — CHLORHEXIDINE GLUCONATE 4 % EX LIQD: 1.0000 "application " | Freq: Once | CUTANEOUS | Status: DC

## 2015-12-02 MED ORDER — GLYCOPYRROLATE 0.2 MG/ML IJ SOLN
INTRAMUSCULAR | Status: AC
  Filled 2015-12-02: qty 1

## 2015-12-02 MED ORDER — ASPIRIN EC 81 MG PO TBEC
81.0000 mg | DELAYED_RELEASE_TABLET | Freq: Every day | ORAL | Status: DC
  Filled 2015-12-02 (×2): qty 1

## 2015-12-02 MED ORDER — CEFOTETAN DISODIUM-DEXTROSE 2-2.08 GM-% IV SOLR
INTRAVENOUS | Status: AC
  Filled 2015-12-02: qty 50

## 2015-12-02 MED ORDER — ONDANSETRON HCL 4 MG PO TABS
4.0000 mg | ORAL_TABLET | Freq: Four times a day (QID) | ORAL | Status: DC | PRN
  Filled 2015-12-02: qty 1

## 2015-12-02 MED ORDER — HYDROMORPHONE HCL 1 MG/ML IJ SOLN
INTRAMUSCULAR | Status: AC
  Filled 2015-12-02: qty 1

## 2015-12-02 MED ORDER — LACTATED RINGERS IR SOLN
Status: DC | PRN
  Administered 2015-12-02: 1000 mL

## 2015-12-02 MED ORDER — NAPROXEN 375 MG PO TABS
375.0000 mg | ORAL_TABLET | Freq: Two times a day (BID) | ORAL | Status: DC
  Administered 2015-12-03: 375 mg via ORAL
  Filled 2015-12-02 (×4): qty 1

## 2015-12-02 MED ORDER — SUGAMMADEX SODIUM 500 MG/5ML IV SOLN
INTRAVENOUS | Status: AC
  Filled 2015-12-02: qty 5

## 2015-12-02 MED ORDER — LEVOTHYROXINE SODIUM 150 MCG PO TABS
150.0000 ug | ORAL_TABLET | ORAL | Status: DC
  Administered 2015-12-03: 150 ug via ORAL
  Filled 2015-12-02 (×2): qty 1

## 2015-12-02 MED ORDER — LACTATED RINGERS IV SOLN
INTRAVENOUS | Status: DC
  Administered 2015-12-02: 15:00:00 via INTRAVENOUS
  Administered 2015-12-02: 1000 mL via INTRAVENOUS

## 2015-12-02 MED ORDER — PANTOPRAZOLE SODIUM 40 MG PO TBEC
40.0000 mg | DELAYED_RELEASE_TABLET | Freq: Every day | ORAL | Status: DC
  Filled 2015-12-02 (×2): qty 1

## 2015-12-02 MED ORDER — ENOXAPARIN SODIUM 40 MG/0.4ML ~~LOC~~ SOLN
40.0000 mg | SUBCUTANEOUS | Status: DC
  Administered 2015-12-03: 40 mg via SUBCUTANEOUS
  Filled 2015-12-02 (×2): qty 0.4

## 2015-12-02 MED ORDER — LIDOCAINE HCL (CARDIAC) 20 MG/ML IV SOLN
INTRAVENOUS | Status: AC
  Filled 2015-12-02: qty 5

## 2015-12-02 MED ORDER — 0.9 % SODIUM CHLORIDE (POUR BTL) OPTIME
TOPICAL | Status: DC | PRN
  Administered 2015-12-02: 1000 mL

## 2015-12-02 MED ORDER — DEXAMETHASONE SODIUM PHOSPHATE 10 MG/ML IJ SOLN
INTRAMUSCULAR | Status: AC
  Filled 2015-12-02: qty 1

## 2015-12-02 MED ORDER — SODIUM CHLORIDE 0.9 % IV SOLN: 30.0000 mL | Freq: Once | INTRAVENOUS | Status: DC

## 2015-12-02 MED ORDER — FENTANYL CITRATE (PF) 100 MCG/2ML IJ SOLN
INTRAMUSCULAR | Status: AC
  Filled 2015-12-02: qty 2

## 2015-12-02 MED ORDER — LEVOTHYROXINE SODIUM 25 MCG PO TABS: 225.0000 ug | ORAL_TABLET | ORAL | Status: DC

## 2015-12-02 MED ORDER — ATROPINE SULFATE 0.4 MG/ML IJ SOLN
INTRAMUSCULAR | Status: AC
  Filled 2015-12-02: qty 1

## 2015-12-02 MED ORDER — ARTIFICIAL TEARS OP OINT
TOPICAL_OINTMENT | OPHTHALMIC | Status: AC
  Filled 2015-12-02: qty 3.5

## 2015-12-02 SURGICAL SUPPLY — 78 items
APL ESCP 34 STRL LF DISP (HEMOSTASIS) ×3
APL SKNCLS STERI-STRIP NONHPOA (GAUZE/BANDAGES/DRESSINGS)
APPLICATOR SURGIFLO ENDO (HEMOSTASIS) ×2 IMPLANT
BAG SPEC RTRVL LRG 6X4 10 (ENDOMECHANICALS) ×6
BENZOIN TINCTURE PRP APPL 2/3 (GAUZE/BANDAGES/DRESSINGS) IMPLANT
BINDER ABDOMINAL 12 ML 46-62 (SOFTGOODS) IMPLANT
CHLORAPREP W/TINT 26ML (MISCELLANEOUS) ×4 IMPLANT
COVER SURGICAL LIGHT HANDLE (MISCELLANEOUS) ×4 IMPLANT
COVER TIP SHEARS 8 DVNC (MISCELLANEOUS) ×3 IMPLANT
COVER TIP SHEARS 8MM DA VINCI (MISCELLANEOUS) ×1
DECANTER SPIKE VIAL GLASS SM (MISCELLANEOUS) ×4 IMPLANT
DEVICE SECURE STRAP 25 ABSORB (INSTRUMENTS) IMPLANT
DEVICE TROCAR PUNCTURE CLOSURE (ENDOMECHANICALS) ×4 IMPLANT
DISSECTOR BLUNT TIP ENDO 5MM (MISCELLANEOUS) IMPLANT
DRAIN CHANNEL 19F RND (DRAIN) IMPLANT
DRAPE ARM DVNC X/XI (DISPOSABLE) ×12 IMPLANT
DRAPE COLUMN DVNC XI (DISPOSABLE) ×3 IMPLANT
DRAPE DA VINCI XI ARM (DISPOSABLE) ×4
DRAPE DA VINCI XI COLUMN (DISPOSABLE) ×1
DRAPE LAPAROSCOPIC ABDOMINAL (DRAPES) ×4 IMPLANT
DRAPE SHEET LG 3/4 BI-LAMINATE (DRAPES) ×8 IMPLANT
DRAPE SURG IRRIG POUCH 19X23 (DRAPES) ×4 IMPLANT
ELECT CAUTERY BLADE 6.4 (BLADE) ×4 IMPLANT
ELECT PENCIL ROCKER SW 15FT (MISCELLANEOUS) ×4 IMPLANT
ELECT REM PT RETURN 9FT ADLT (ELECTROSURGICAL) ×8
ELECTRODE REM PT RTRN 9FT ADLT (ELECTROSURGICAL) ×6 IMPLANT
EVACUATOR SILICONE 100CC (DRAIN) IMPLANT
GLOVE BIO SURGEON STRL SZ 6 (GLOVE) ×16 IMPLANT
GLOVE BIO SURGEON STRL SZ 6.5 (GLOVE) ×8 IMPLANT
GLOVE BIOGEL M 8.0 STRL (GLOVE) ×4 IMPLANT
GOWN STRL REUS W/ TWL LRG LVL3 (GOWN DISPOSABLE) ×9 IMPLANT
GOWN STRL REUS W/TWL LRG LVL3 (GOWN DISPOSABLE) ×12
GOWN STRL REUS W/TWL XL LVL3 (GOWN DISPOSABLE) ×12 IMPLANT
HOLDER FOLEY CATH W/STRAP (MISCELLANEOUS) ×4 IMPLANT
KIT BASIN OR (CUSTOM PROCEDURE TRAY) ×8 IMPLANT
LIQUID BAND (GAUZE/BANDAGES/DRESSINGS) ×6 IMPLANT
MANIPULATOR UTERINE 4.5 ZUMI (MISCELLANEOUS) IMPLANT
NDL SPNL 22GX3.5 QUINCKE BK (NEEDLE) ×2 IMPLANT
NEEDLE SPNL 22GX3.5 QUINCKE BK (NEEDLE) ×4 IMPLANT
OBTURATOR XI 8MM BLADELESS (TROCAR) ×4 IMPLANT
OCCLUDER COLPOPNEUMO (BALLOONS) IMPLANT
PEN SKIN MARKING BROAD (MISCELLANEOUS) ×8 IMPLANT
POUCH SPECIMEN RETRIEVAL 10MM (ENDOMECHANICALS) ×4 IMPLANT
SCRUB PCMX 4 OZ (MISCELLANEOUS) ×4 IMPLANT
SEAL CANN UNIV 5-8 DVNC XI (MISCELLANEOUS) ×12 IMPLANT
SEAL XI 5MM-8MM UNIVERSAL (MISCELLANEOUS) ×4
SET BI-LUMEN FLTR TB AIRSEAL (TUBING) IMPLANT
SET IRRIG TUBING LAPAROSCOPIC (IRRIGATION / IRRIGATOR) IMPLANT
SET TUBE IRRIG SUCTION NO TIP (IRRIGATION / IRRIGATOR) ×4 IMPLANT
SHEARS HARMONIC ACE PLUS 45CM (MISCELLANEOUS) IMPLANT
SHEET LAVH (DRAPES) ×4 IMPLANT
SLEEVE XCEL OPT CAN 5 100 (ENDOMECHANICALS) IMPLANT
SOLUTION ELECTROLUBE (MISCELLANEOUS) ×4 IMPLANT
STAPLER VISISTAT 35W (STAPLE) ×4 IMPLANT
STRIP CLOSURE SKIN 1/2X4 (GAUZE/BANDAGES/DRESSINGS) IMPLANT
SURGIFLO W/THROMBIN 8M KIT (HEMOSTASIS) ×2 IMPLANT
SUT NOVA 0 T19/GS 22DT (SUTURE) IMPLANT
SUT NOVA 1 T20/GS 25DT (SUTURE) IMPLANT
SUT PROLENE 0 CT 1 CR/8 (SUTURE) IMPLANT
SUT VIC AB 0 CT1 27 (SUTURE)
SUT VIC AB 0 CT1 27XBRD ANTBC (SUTURE) IMPLANT
SUT VIC AB 3-0 SH 27 (SUTURE) ×8
SUT VIC AB 3-0 SH 27XBRD (SUTURE) ×2 IMPLANT
SUT VIC AB 4-0 PS2 18 (SUTURE) ×4 IMPLANT
SUT VIC AB 4-0 PS2 27 (SUTURE) ×8 IMPLANT
SUT VIC AB 4-0 SH 18 (SUTURE) ×4 IMPLANT
SYR 50ML LL SCALE MARK (SYRINGE) IMPLANT
TACKER 5MM HERNIA 3.5CML NAB (ENDOMECHANICALS) IMPLANT
TOWEL OR 17X26 10 PK STRL BLUE (TOWEL DISPOSABLE) ×12 IMPLANT
TOWEL OR NON WOVEN STRL DISP B (DISPOSABLE) ×8 IMPLANT
TRAP SPECIMEN MUCOUS 40CC (MISCELLANEOUS) IMPLANT
TRAY FOLEY W/METER SILVER 14FR (SET/KITS/TRAYS/PACK) ×4 IMPLANT
TRAY FOLEY W/METER SILVER 16FR (SET/KITS/TRAYS/PACK) IMPLANT
TRAY LAPAROSCOPIC (CUSTOM PROCEDURE TRAY) ×8 IMPLANT
TROCAR BLADELESS OPT 5 100 (ENDOMECHANICALS) ×8 IMPLANT
TROCAR UNIVERSAL OPT 12M 100M (ENDOMECHANICALS) ×4 IMPLANT
TROCAR XCEL NON-BLD 11X100MML (ENDOMECHANICALS) IMPLANT
WATER STERILE IRR 1500ML POUR (IV SOLUTION) ×8 IMPLANT

## 2015-12-02 NOTE — Progress Notes (Signed)
Patient has home CPAP and will place herself on when ready. RT will continue to monitor.  

## 2015-12-02 NOTE — Op Note (Signed)
OPERATIVE NOTE  Date: 12/02/15  Preoperative Diagnosis: right ovarian mass.    Postoperative Diagnosis:  Same and benign mucinous ovarian mass  Procedure(s) Performed: Robotic-assisted laparoscopic bilateral salpingo-oophorectomy, lysis of adhesions  Surgeon: Everitt Amber, M.D.  Assistant Surgeon: Lahoma Crocker M.D. (an MD assistant was necessary for tissue manipulation, management of robotic instrumentation, retraction and positioning due to the complexity of the case and hospital policies).  Dr Johnathan Hausen.  Anesthesia: Gen. endotracheal.  Specimens: Bilateral ovaries, fallopian tubes, pelvic washings  Estimated Blood Loss: <50 mL. Blood Replacement: None  Complications: none  Indication for Procedure:  8cm complex right ovarian cyst, normal CA125, large ventral hernia  Operative Findings: adhesions between omentum and anterior abdominal wall and hernia sac. Extensive hernia sac in lower abdomen. Dense adhesions between ileum and right ovary. Normal left tube and ovary.  Frozen pathology was consistent with benign ovarian cyst.  Procedure: The patient's taken to the operating room and placed under general endotracheal anesthesia testing difficulty. She is placed in a dorsolithotomy position and cervical acromial pad was placed. The arms were tucked with care taken to pad the olecranon process. And prepped and draped in usual sterile fashion. A 23mm incision was made in the left upper quadrant palmer's point and a 5 mm Optiview trocar used to enter the abdomen under direct visualization. With entry into the abdomen and then maintenance of 15 mm of mercury the patient was placed in Trendelenburg position. An incision was made in the right lateral abdomen free from the central adhesions. An additional 22mm left mid abdominal port was placed under direct visualization and Dr Hassell Done and I took down the adhesions to the anterior abdominal wall (see separate operative note). When all  adhesions had been taken down, the remaining port was placed in the umbilicus and a 42mm trochar was placed through this site. A 2nd right abdomen port measuring 60mm was placed. These incisions were made approximately The left upper quadrant port was upsized to a 34mm port. The robot was docked.  The abdomen was inspected as was the pelvis.  Pelvic washings were obtained.   Using meticulous sharp dissection, the ileum and sigmoid was dissected free from the adhesions to the right ovarian cyst. No breech of the bowel wall occurred, but there were 2 sites of deserosalization that were noted. These were oversewn for reinforcement with 3-0vicryl suture interrupted. An incision was made on the right pelvic side wall peritoneum parallel to the IP ligament and the retroperitoneal space entered. The right ureter was identified and the para-rectal space was developed. A window was created in the right broad ligament above the ureter. The right infundibulopelvic vessels were skeletonized cauterized and transected. For 30 minutes adhesiolysis was performed to free the right ovary from the right pelvic side wall. The residual attachment to the vagina was cauterized and transected. Specimen was placed in an Endo Catch bag. It was retrieved and removed and sent for frozen section which was benign.  In a similar manner the left peritoneum and the side wall was incised, and the retroperitoneal space entered. The left ureter was identified and the left pararectal space was developed. The IP ligament was bipolar sealed then cut. The attachments to the vagina were also skeletonized and transected. The left adnexa was placed in an Endo Catch bag.  The abdomen was copiously irrigated and drained and all operative sites inspected and hemostasis was assured.  The robot was undocked. The ports were all removed. The fascial closure at the umbilical  incision and left upper quadrant port was made with 0 Vicryl.  All incisions were  closed with a running subcuticular Monocryl suture. Dermabond was applied. Sponge, lap and needle counts were correct x 3.    The patient had sequential compression devices for VTE prophylaxis.         Disposition: PACU -stable         Condition: stable  Donaciano Eva, MD

## 2015-12-02 NOTE — H&P (Signed)
Preop H&P Note: Gyn-Onc  Consult was initially requested by Dr. Julien Girt for the evaluation of Jackie Stevens 69 y.o. female with an 8cm ovarian cyst.  CC:  Chief Complaint  Patient presents with  . right adnexal mass    New consult    Assessment/Plan:  Jackie Stevens is a 69 y.o. year old with an asymptomatic 8cm ovarian cyst and ventral abdominal hernia. Based on its appearance on imaging and her normal CA 125 (3) I believe this mass is most likely benign. Given her complex abdominal wall hernia, surgical resection will require a simultaneous hernia repair with Dr Hassell Done.  We will have her seen by [facility] for evaluation of her hernia. If she is a good candidate for a hernia repair, and if it is felt that this is a necessary procedure, then we would perform a BSO at the same surgery.  I discussed operative risks including  bleeding, infection, damage to internal organs (such as bladder,ureters, bowels), blood clot, reoperation and rehospitalization.   HPI: Jackie Stevens is a 69 year old woman who is seen in consultation at the request of Dr Julien Girt for an 8cm right complex ovarian cyst. The patient's cyst was identified on a routine annual pelvic exam when fullness was appreciated in the pelvis. A pelvic US was ordered which showed a 7.7cm complex cyst in the right adnexa with no abnromal blood flow. A followup CT scan was performed on 08/14/15 which confirmed the 7-8cm right ovarian mass, but also showed a complex ventral wall hernia with multiple loops of small intestine and transverse colon involved.  CA 125 was drawn on 08/08/15 and was normal at 3.  The patient is obese (BMI 38kg/m2). SHe has a history of a prior hysterectomy for benign disease. She has a history of diverticultis with abscess formation and received an ex lap and sigmoid colectomy with reanastamosis with Dr Hulen Skains at [facility] in 2014.   Current Meds:  Outpatient Encounter Prescriptions as of  09/05/2015  Medication Sig  . aspirin EC 81 MG tablet Take 81 mg by mouth at bedtime.  . clobetasol cream (TEMOVATE) 0.05 %   . doxycycline (VIBRA-TABS) 100 MG tablet Take 1 tablet (100 mg total) by mouth 2 (two) times daily.  . hydrochlorothiazide (HYDRODIURIL) 25 MG tablet Take 1 tablet (25 mg total) by mouth daily.  . hydrOXYzine (ATARAX/VISTARIL) 10 MG tablet Take 1 tablet (10 mg total) by mouth every 8 (eight) hours as needed.  Marland Kitchen KRILL OIL PO Take 500 mg by mouth daily.   Marland Kitchen levothyroxine (SYNTHROID, LEVOTHROID) 150 MCG tablet Take 150-225 mcg by mouth daily. 1 tablet (150 mcg) every day except Sunday, 1.5 tablet (225 mcg) on Sunday  . naproxen (NAPROSYN) 375 MG tablet TAKE 1 TABLET (375 MG TOTAL) BY MOUTH 2 (TWO) TIMES DAILY WITH A MEAL.  Marland Kitchen omeprazole (PRILOSEC) 20 MG capsule TAKE 1 CAPSULE EVERY MORNING AT BREAFAST  . Polyethyl Glycol-Propyl Glycol 0.4-0.3 % GEL Apply 1 drop to eye at bedtime.  . predniSONE (DELTASONE) 20 MG tablet 1 tab po bid x 5 days  . Probiotic Product (PROBIOTIC ADVANCED PO) Take 2 tablets by mouth daily. Digestive Advantage  . psyllium (REGULOID) 0.52 G capsule Take 0.52 g by mouth at bedtime.  . ranitidine (ZANTAC) 150 MG tablet TAKE 1 TABLET BY MOUTH AT BEDTIME  . senna (SENOKOT) 8.6 MG TABS tablet Take 1 tablet by mouth daily as needed for mild constipation.  . simvastatin (ZOCOR) 40 MG tablet  TAKE 1 TABLET (40 MG TOTAL) BY MOUTH AT BEDTIME.  . sodium chloride (OCEAN) 0.65 % SOLN nasal spray Place 1 spray into both nostrils at bedtime.  . traMADol (ULTRAM) 50 MG tablet Take 1 tablet (50 mg total) by mouth every 6 (six) hours as needed.  . TURMERIC PO Take 1 tablet by mouth daily.   . valsartan (DIOVAN) 320 MG tablet Take 1 tablet (320 mg total) by mouth daily.  . vitamin B-12 (CYANOCOBALAMIN) 1000 MCG tablet Take 2,500 mcg by mouth daily.  . Vitamin D, Cholecalciferol, 1000 UNITS CAPS Take 1  capsule by mouth daily.  Marland Kitchen albuterol (PROVENTIL HFA;VENTOLIN HFA) 108 (90 BASE) MCG/ACT inhaler Inhale 2 puffs into the lungs every 6 (six) hours as needed for wheezing. (Patient not taking: Reported on 09/05/2015)  . cetirizine (ZYRTEC) 10 MG tablet Take 1 tablet (10 mg total) by mouth daily. (Patient not taking: Reported on 09/05/2015)  . fluticasone (FLONASE) 50 MCG/ACT nasal spray Place 2 sprays into both nostrils daily. (Patient not taking: Reported on 09/05/2015)  . NEOMYCIN-POLYMYXIN-HYDROCORTISONE (CORTISPORIN) 1 % SOLN otic solution Place 3 drops into both ears 2 (two) times daily as needed. For 5 days. (Patient not taking: Reported on 09/05/2015)  . NITROSTAT 0.4 MG SL tablet PLACE 1 TABLET UNDER THE TONGUE EVERY 5 MINUTES AS NEEDED FOR CHEST PAIN (Patient not taking: Reported on 09/05/2015)   No facility-administered encounter medications on file as of 09/05/2015.    Allergy:  Allergies  Allergen Reactions  . Niacin Other (See Comments) and Cough    "cough til I threw up" (12/19/2012)  . Neomycin-Bacitracin Zn-Polymyx Rash    Polysporin- is tolerated     Social Hx:  Social History   Social History  . Marital Status: Married    Spouse Name: N/A  . Number of Children: N/A  . Years of Education: N/A   Occupational History  . Not on file.   Social History Main Topics  . Smoking status: Never Smoker   . Smokeless tobacco: Never Used  . Alcohol Use: 0.0 oz/week    0 Standard drinks or equivalent per week     Comment: 12/19/2012 "glass of wine q night q other month or so"  . Drug Use: No  . Sexual Activity: No   Other Topics Concern  . Not on file   Social History Narrative   Married    children    Past Surgical Hx:  Past Surgical History  Procedure Laterality Date  . Thyroidectomy, partial  1988    "then did iodine to remove the rest" (12/19/2012)  . Sigmoid  resection / rectopexy  12/19/2012  . Colon surgery    . Tonsillectomy  1951?  Marland Kitchen Cholecystectomy  1990  . Vaginal hysterectomy  1970's    "still have my ovaries" (12/19/2012)  . Dilation and curettage of uterus  1960's    "lots of them; had miscarriages" (12/19/2012)  . Transrectal drainage of pelvic abscess  10/27/2012  . Colostomy revision  12/19/2012    Procedure: COLON RESECTION SIGMOID; Surgeon: Gwenyth Ober, MD; Location: [place]; Service: General; Laterality: N/A;  . Cystoscopy with stent placement  12/19/2012    Procedure: CYSTOSCOPY WITH STENT PLACEMENT; Surgeon: Hanley Ben, MD; Location: [place]; Service: Urology; Laterality: N/A;    Past Medical Hx:  Past Medical History  Diagnosis Date  . Allergic rhinitis   . GERD (gastroesophageal reflux disease)   . Hyperlipidemia   . Chicken pox as a child  . Measles  as a child  . Insomnia   . Thyroid disease   . Constipation   . Diverticulitis 10/25/2012    pt. reports that a drain was placed - 09/2012   . Abnormal cervical cytology 10/25/2012    Follows with Dr Leavy Cella of Gyn  . Asthma   . Heart murmur   . Complication of anesthesia   . PONV (postoperative nausea and vomiting)   . HTN (hypertension)     stress test completed by Anselm Lis, diagnosed as GERD  . Thyroid cancer 1980's  . Hypothyroidism   . H/O hiatal hernia   . External hemorrhoid, bleeding     "sometimes" (01-07-2013)  . Arthritis     "knees; right thumb; shoulders" (01/07/2013)  . Kidney stones 1970's    "passed on their own" (2013-01-07)  . OSA on CPAP   . UTI (urinary tract infection) 04/02/2013  . Anemia 10/06/2013  . Preventative health care 10/06/2013    [facility] D[facility]at[facility] Pneumovax in 2012  . Low back pain 06/09/2014  . Medicare annual wellness visit, subsequent 10/06/2013     [facility] of Dermatology Pneumovax in 2012   . Paresthesia 03/23/2015    Left face    Past Gynecological History: SVD x 2, prior hyst for benign disease No LMP recorded. Patient has had a hysterectomy.  Family Hx:  Family History  Problem Relation Age of Onset  . Heart disease Father   . Pneumonia Father   . Hypertension Father   . Hyperlipidemia Father   . Cancer Father     skin  . Stroke Father   . Cancer Paternal Aunt   . Alzheimer's disease Mother   . Heart disease Mother   . Emphysema Brother     marijuana and cigarettes  . Alcohol abuse Brother   . Hearing loss Brother   . Diabetes Maternal Grandmother   . Alzheimer's disease Paternal Grandmother   . Cancer Paternal Grandmother     lung?- smoker  . Hyperlipidemia Paternal Grandmother   . Heart attack Paternal Grandfather   . Alcohol abuse Paternal Grandfather   . Neurofibromatosis Son     schwanomatosis  . Neurofibromatosis Son     swanomatosis    Review of Systems:  Constitutional  Feels well,  ENT Normal appearing ears and nares bilaterally Skin/Breast  No rash, sores, jaundice, itching, dryness Cardiovascular  No chest pain, shortness of breath, or edema  Pulmonary  No cough or wheeze.  Gastro Intestinal  No nausea, vomitting, or diarrhoea. No bright red blood per rectum, no abdominal pain, change in bowel movement, or constipation.  Genito Urinary  No frequency, urgency, dysuria, see HPI Musculo Skeletal  No myalgia, arthralgia, joint swelling or pain  Neurologic  No weakness, numbness, change in gait,  Psychology  No depression, anxiety, insomnia.   Vitals: Blood pressure 175/70, pulse 77, temperature 98.1 F (36.7 C), temperature source Oral, resp. rate 20, height 5\' 2"  (1.575 m), weight 209 lb 9.6 oz (95.074 kg), SpO2 99 %.  Physical Exam: WD in NAD Neck  Supple  NROM, without any enlargements.  Lymph Node Survey No cervical supraclavicular or inguinal adenopathy Cardiovascular  Pulse normal rate, regularity and rhythm. S1 and S2 normal.  Lungs  Clear to auscultation bilateraly, without wheezes/crackles/rhonchi. Good air movement.  Skin  No rash/lesions/breakdown  Psychiatry  Alert and oriented to person, place, and time  Abdomen  Normoactive bowel sounds, abdomen soft, non-tender and obese with large left mid abdominal ventral hernia.  Back  No CVA tenderness Genito Urinary  Vulva/vagina: Normal external female genitalia. No lesions. No discharge or bleeding. Bladder/urethra: No lesions or masses, well supported bladder Vagina: normal Cervix: surgically absent Uterus: surgically absent  Adnexa: smooth, cystic mobile right adnexal mass. Rectal  Good tone, no masses no cul de sac nodularity.  Extremities  No bilateral cyanosis, clubbing or edema.

## 2015-12-02 NOTE — Anesthesia Procedure Notes (Signed)
Procedure Name: Intubation Date/Time: 12/02/2015 2:38 PM Performed by: Danley Danker L Patient Re-evaluated:Patient Re-evaluated prior to inductionOxygen Delivery Method: Circle system utilized Preoxygenation: Pre-oxygenation with 100% oxygen Intubation Type: IV induction Ventilation: Mask ventilation without difficulty and Oral airway inserted - appropriate to patient size Laryngoscope Size: Sabra Heck and 2 Grade View: Grade I Tube type: Oral Tube size: 7.5 mm Number of attempts: 1 Airway Equipment and Method: Stylet Placement Confirmation: ETT inserted through vocal cords under direct vision,  breath sounds checked- equal and bilateral and positive ETCO2 Secured at: 21 cm Tube secured with: Tape Dental Injury: Teeth and Oropharynx as per pre-operative assessment

## 2015-12-02 NOTE — Anesthesia Preprocedure Evaluation (Addendum)
Anesthesia Evaluation  Patient identified by MRN, date of birth, ID band Patient awake    Reviewed: Allergy & Precautions, H&P , NPO status , Patient's Chart, lab work & pertinent test results  Airway Mallampati: II  TM Distance: >3 FB Neck ROM: full    Dental  (+) Dental Advisory Given, Caps All front teeth are capped:   Pulmonary shortness of breath and with exertion, asthma , sleep apnea and Continuous Positive Airway Pressure Ventilation ,    Pulmonary exam normal breath sounds clear to auscultation       Cardiovascular Exercise Tolerance: Good hypertension, Pt. on medications Normal cardiovascular exam Rhythm:regular Rate:Normal  AT CP - GERD. Stress test OK. ICLBBB   Neuro/Psych Anxiety Left face paresthesia 4/16 negative neurological ROS  negative psych ROS   GI/Hepatic negative GI ROS, Neg liver ROS, hiatal hernia, GERD  Medicated and Controlled,  Endo/Other  Hypothyroidism Thyroid cancer  Renal/GU negative Renal ROS  negative genitourinary   Musculoskeletal   Abdominal   Peds  Hematology negative hematology ROS (+)   Anesthesia Other Findings   Reproductive/Obstetrics negative OB ROS                            Anesthesia Physical Anesthesia Plan  ASA: III  Anesthesia Plan: General   Post-op Pain Management:    Induction: Intravenous  Airway Management Planned: Oral ETT  Additional Equipment:   Intra-op Plan:   Post-operative Plan: Extubation in OR  Informed Consent: I have reviewed the patients History and Physical, chart, labs and discussed the procedure including the risks, benefits and alternatives for the proposed anesthesia with the patient or authorized representative who has indicated his/her understanding and acceptance.   Dental Advisory Given  Plan Discussed with: CRNA and Surgeon  Anesthesia Plan Comments:         Anesthesia Quick Evaluation

## 2015-12-02 NOTE — Transfer of Care (Signed)
Immediate Anesthesia Transfer of Care Note  Patient: Jackie Stevens  Procedure(s) Performed: Procedure(s): XI ROBOTIC ASSISTED BILATERAL SALPINGO OOPHORECTOMY (Bilateral) LAPAROSCOPIC LYSIS OF ADHESION (N/A)  Patient Location: PACU  Anesthesia Type:General  Level of Consciousness: awake and alert   Airway & Oxygen Therapy: Patient Spontanous Breathing and Patient connected to face mask oxygen  Post-op Assessment: Report given to RN and Post -op Vital signs reviewed and stable  Post vital signs: Reviewed and stable  Last Vitals:  Filed Vitals:   12/02/15 1044  BP: 152/65  Pulse: 92  Temp: 36.4 C  Resp: 18    Complications: No apparent anesthesia complications

## 2015-12-02 NOTE — Anesthesia Postprocedure Evaluation (Signed)
Anesthesia Post Note  Patient: Jackie Stevens  Procedure(s) Performed: Procedure(s) (LRB): XI ROBOTIC ASSISTED BILATERAL SALPINGO OOPHORECTOMY (Bilateral) LAPAROSCOPIC LYSIS OF ADHESION (N/A)  Patient location during evaluation: PACU Anesthesia Type: General Level of consciousness: awake and alert Pain management: pain level controlled Vital Signs Assessment: post-procedure vital signs reviewed and stable Respiratory status: spontaneous breathing, nonlabored ventilation, respiratory function stable and patient connected to nasal cannula oxygen Cardiovascular status: blood pressure returned to baseline and stable Postop Assessment: no signs of nausea or vomiting Anesthetic complications: no    Last Vitals:  Filed Vitals:   12/02/15 1807 12/02/15 1901  BP: 160/68 153/68  Pulse: 78 78  Temp: 36.6 C 36.6 C  Resp: 16 18    Last Pain:  Filed Vitals:   12/02/15 1902  PainSc: 5                  Chaz Mcglasson L

## 2015-12-02 NOTE — Interval H&P Note (Signed)
History and Physical Interval Note:  12/02/2015 1:13 PM  Jackie Stevens  has presented today for surgery, with the diagnosis of VENTRAL HERNIA   The various methods of treatment have been discussed with the patient and family. After consideration of risks, benefits and other options for treatment, the patient has consented to  Procedure(s): LAPAROSCOPIC VENTRAL HERNIA WITH MESH (N/A) INSERTION OF MESH (N/A) XI ROBOTIC ASSISTED BILATERAL SALPINGO OOPHORECTOMY (Bilateral) as a surgical intervention .  The patient's history has been reviewed, patient examined, no change in status, stable for surgery.  I have reviewed the patient's chart and labs.  Questions were answered to the patient's satisfaction.     [name] B

## 2015-12-03 ENCOUNTER — Encounter (HOSPITAL_COMMUNITY): Payer: Self-pay | Admitting: Surgery

## 2015-12-03 DIAGNOSIS — D270 Benign neoplasm of right ovary: Secondary | ICD-10-CM | POA: Diagnosis not present

## 2015-12-03 LAB — BASIC METABOLIC PANEL
Anion gap: 6 (ref 5–15)
BUN: 15 mg/dL (ref 6–20)
CO2: 28 mmol/L (ref 22–32)
Calcium: 8.2 mg/dL — ABNORMAL LOW (ref 8.9–10.3)
Chloride: 102 mmol/L (ref 101–111)
Creatinine, Ser: 0.62 mg/dL (ref 0.44–1.00)
GFR calc Af Amer: >60 mL/min (ref >60)
GFR calc non Af Amer: >60 mL/min (ref >60)
Glucose, Bld: 126 mg/dL — ABNORMAL HIGH (ref 65–99)
Potassium: 4.2 mmol/L (ref 3.5–5.1)
Sodium: 136 mmol/L (ref 135–145)

## 2015-12-03 LAB — CBC
HCT: 29.9 % — ABNORMAL LOW (ref 36.0–46.0)
Hemoglobin: 9.8 g/dL — ABNORMAL LOW (ref 12.0–15.0)
MCH: 28.7 pg (ref 26.0–34.0)
MCHC: 32.8 g/dL (ref 30.0–36.0)
MCV: 87.7 fL (ref 78.0–100.0)
Platelets: 256 10*3/uL (ref 150–400)
RBC: 3.41 MIL/uL — ABNORMAL LOW (ref 3.87–5.11)
RDW: 13.1 % (ref 11.5–15.5)
WBC: 12.3 10*3/uL — ABNORMAL HIGH (ref 4.0–10.5)

## 2015-12-03 MED ORDER — OXYCODONE-ACETAMINOPHEN 5-325 MG PO TABS: 1.0000 | ORAL_TABLET | ORAL | Status: DC | PRN

## 2015-12-03 NOTE — Discharge Instructions (Addendum)
12/03/2015 Discharge Instructions for Bilateral Salpingo-oophorectomy  Activity: 1. Be up and out of the bed during the day.  Take a nap if needed.  You may walk up steps but be careful and use the hand rail.  Stair climbing will tire you more than you think, you may need to stop part way and rest.   2. No lifting or straining for 6 weeks or per Dr. Earlie Server instructions.  3. Do not drive if you are taking narcotic pain medicine.  4. Shower daily.  Use soap and water on your incision and pat dry; don't rub.  No tub baths until cleared by your surgeon.   Diet: 1. Low sodium Heart Healthy Diet is recommended.  2. It is safe to use a laxative, such as Miralax or Colace, if you have difficulty moving your bowels.   Wound Care: 1. Keep clean and dry.  Shower daily.  Reasons to call the Doctor:  Fever - Oral temperature greater than 100.4 degrees Fahrenheit  Foul-smelling vaginal discharge  Difficulty urinating  Nausea and vomiting  Increased pain at the site of the incision that is unrelieved with pain medicine.  Difficulty breathing with or without chest pain  New calf pain especially if only on one side  Sudden, continuing increased vaginal bleeding with or without clots.   Contacts: For questions or concerns you should contact:  Dr. Everitt Amber at [phone]  Joylene John, NP at [phone]  After Hours: call [phone] and have the GYN Oncologist paged/contacted

## 2015-12-03 NOTE — Progress Notes (Signed)
Patient ID: Jackie Stevens, female   DOB: [birth year], 69 y.o.   MRN: [id number] [facility] Surgery Progress Note:   1 Day Post-Op  Subjective: Mental status is clear.  Up walking about. Objective: Vital signs in last 24 hours: Temp:  [97.6 F (36.4 C)-98.3 F (36.8 C)] 98.3 F (36.8 C) (12/21 0451) Pulse Rate:  [68-92] 83 (12/21 0451) Resp:  [9-20] 16 (12/21 0451) BP: (138-160)/(48-71) 138/48 mmHg (12/21 0451) SpO2:  [94 %-100 %] 100 % (12/21 0451) Weight:  [91 kg (200 lb 9.9 oz)-121.11 kg (267 lb)] 121.11 kg (267 lb) (12/21 0403)  Intake/Output from previous day: 12/20 0701 - 12/21 0700 In: 2694.2 [P.O.:450; I.V.:2144.2] Out: 750 [Urine:700; Blood:50] Intake/Output this shift:    Physical Exam: Work of breathing is normal  Lab Results:  Results for orders placed or performed during the hospital encounter of 12/02/15 (from the past 48 hour(s))  CBC     Status: Abnormal   Collection Time: 12/03/15  5:22 AM  Result Value Ref Range   WBC 12.3 (H) 4.0 - 10.5 K/uL   RBC 3.41 (L) 3.87 - 5.11 MIL/uL   Hemoglobin 9.8 (L) 12.0 - 15.0 g/dL   HCT 29.9 (L) 36.0 - 46.0 %   MCV 87.7 78.0 - 100.0 fL   MCH 28.7 26.0 - 34.0 pg   MCHC 32.8 30.0 - 36.0 g/dL   RDW 13.1 11.5 - 15.5 %   Platelets 256 150 - 400 K/uL  Basic metabolic panel     Status: Abnormal   Collection Time: 12/03/15  5:22 AM  Result Value Ref Range   Sodium 136 135 - 145 mmol/L   Potassium 4.2 3.5 - 5.1 mmol/L   Chloride 102 101 - 111 mmol/L   CO2 28 22 - 32 mmol/L   Glucose, Bld 126 (H) 65 - 99 mg/dL   BUN 15 6 - 20 mg/dL   Creatinine, Ser 0.62 0.44 - 1.00 mg/dL   Calcium 8.2 (L) 8.9 - 10.3 mg/dL   GFR calc non Af Amer >60 >60 mL/min   GFR calc Af Amer >60 >60 mL/min    Comment: (NOTE) The eGFR has been calculated using the CKD EPI equation. This calculation has not been validated in all clinical situations. eGFR's persistently <60 mL/min signify possible Chronic Kidney Disease.    Anion gap 6 5 - 15     Radiology/Results: No results found.  Anti-infectives: Anti-infectives    Start     Dose/Rate Route Frequency Ordered Stop   12/02/15 1045  cefoTEtan in Dextrose 5% (CEFOTAN) IVPB 2 g     2 g Intravenous On call to O.R. 12/02/15 1045 12/02/15 1445      Assessment/Plan: Problem List: Patient Active Problem List   Diagnosis Date Noted  . Right ovarian cyst 12/02/2015  . Ventral hernia 12/02/2015  . Pelvic mass in female 12/02/2015  . Colitis 09/14/2015  . Sepsis ([place]) 09/14/2015  . Paresthesia 03/23/2015  . Otitis, externa, infective 03/13/2015  . Hyponatremia 03/13/2015  . Cystitis 09/10/2014  . Headache(784.0) 08/21/2014  . Low back pain 06/09/2014  . Anemia 10/06/2013  . Medicare annual wellness visit, subsequent 10/06/2013  . UTI (urinary tract infection) 04/02/2013  . Asthma, mild intermittent 04/02/2013  . Internal hemorrhoid, bleeding 01/23/2013  . Diverticulitis of rectosigmoid 11/28/2012  . Colonic diverticular abscess 11/21/2012  . Diverticulitis 10/25/2012  . Abnormal cervical cytology 10/25/2012  . Cancer ([place])   . Insomnia   . Thyroid disease   . Constipation   .  Cough 09/02/2012  . Chest pain 08/05/2011  . Hyperlipidemia, mixed 11/08/2010  . Essential hypertension 11/08/2010  . G E R D 11/08/2010  . Obstructive sleep apnea 11/04/2010  . ALLERGIC RHINITIS 11/04/2010    I discussed findings and why I did not attempt repair of her complex abdominal wall hernia yesterday.  She may see me in the office in a month and we can discuss it then.   1 Day Post-Op    LOS: 1 day   Matt B. Hassell Done, MD, [facility] [phone] beeper [phone]  12/03/2015 9:34 AM

## 2015-12-03 NOTE — Discharge Summary (Signed)
Physician Discharge Summary  Patient ID: Jackie Stevens MRN: [id number] DOB/AGE: 71-Dec-1947 71 y.o.  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  Kept overnight and did well-ready for discharge on PD 1.   Consults: none  Significant Diagnostic Studies: path was benign    Discharge Exam: Blood pressure 126/42, pulse 87, temperature 99 F (37.2 C), temperature source Oral, resp. rate 18, height 5\' 2"  (1.575 m), weight 121.11 kg (267 lb), SpO2 100 %. Up walking about ok.  Stable postop  Disposition: 01-Home or Self Care  Discharge Instructions    Diet - low sodium heart healthy    Complete by:  As directed      Discharge wound care:    Complete by:  As directed   May shower ad lib     Increase activity slowly    Complete by:  As directed             Medication List    TAKE these medications        albuterol 108 (90 BASE) MCG/ACT inhaler  Commonly known as:  PROVENTIL HFA;VENTOLIN HFA  Inhale 2 puffs into the lungs every 6 (six) hours as needed for wheezing.     aspirin EC 81 MG tablet  Take 81 mg by mouth daily.     cetirizine 10 MG tablet  Commonly known as:  ZYRTEC  Take 1 tablet (10 mg total) by mouth daily.     docusate sodium 100 MG capsule  Commonly known as:  COLACE  Take 100 mg by mouth 2 (two) times daily.     fluticasone 50 MCG/ACT nasal spray  Commonly known as:  FLONASE  Place 2 sprays into both nostrils daily.     hydrochlorothiazide 25 MG tablet  Commonly known as:  HYDRODIURIL  Take 1 tablet (25 mg total) by  mouth daily.     KRILL OIL PO  Take 750 mg by mouth daily.     levothyroxine 150 MCG tablet  Commonly known as:  SYNTHROID, LEVOTHROID  Take 150-225 mcg by mouth daily before breakfast. 1 tablet (150 mcg) every day except Sunday, 1.5 tablet (225 mcg) on Sunday     naproxen 375 MG tablet  Commonly known as:  NAPROSYN  Take 375 mg by mouth 2 (two) times daily with a meal.     NITROSTAT 0.4 MG SL tablet  Generic drug:  nitroGLYCERIN  PLACE 1 TABLET UNDER THE TONGUE EVERY 5 MINUTES AS NEEDED FOR CHEST PAIN     omeprazole 20 MG capsule  Commonly known as:  PRILOSEC  TAKE 1 CAPSULE EVERY MORNING AT BREAFAST     oxyCODONE-acetaminophen 5-325 MG tablet  Commonly known as:  PERCOCET/ROXICET  Take 1-2 tablets by mouth every 4 (four) hours as needed (moderate to severe pain (when tolerating fluids)).     Polyethyl Glycol-Propyl Glycol 0.4-0.3 % Gel ophthalmic gel  Commonly known as:  SYSTANE  Apply 1 drop to eye 2 (two) times daily as needed (dry eyes).     polyethylene glycol packet  Commonly known as:  MIRALAX / GLYCOLAX  Take 17 g by  mouth daily.     PROBIOTIC ADVANCED PO  Take 2 tablets by mouth at bedtime. Digestive Advantage     psyllium 0.52 G capsule  Commonly known as:  REGULOID  Take 0.52 g by mouth at bedtime.     ranitidine 150 MG tablet  Commonly known as:  ZANTAC  TAKE 1 TABLET BY MOUTH AT BEDTIME     simvastatin 40 MG tablet  Commonly known as:  ZOCOR  TAKE 1 TABLET (40 MG TOTAL) BY MOUTH AT BEDTIME.     sodium chloride 0.65 % Soln nasal spray  Commonly known as:  OCEAN  Place 1 spray into both nostrils 2 (two) times daily as needed for congestion.     traMADol 50 MG tablet  Commonly known as:  ULTRAM  Take 1 tablet (50 mg total) by mouth every 6 (six) hours as needed for moderate pain.     TURMERIC PO  Take 1 tablet by mouth daily.     valsartan 320 MG tablet  Commonly known as:  DIOVAN  Take 1 tablet (320 mg total) by mouth daily.     vitamin B-12  1000 MCG tablet  Commonly known as:  CYANOCOBALAMIN  Take 1,000 mcg by mouth daily.     Vitamin D (Cholecalciferol) 1000 UNITS Caps  Take 1 capsule by mouth daily.           Follow-up Information    Follow up with Cathe Bilger B, MD In 5 weeks.   Specialty:  General Surgery   Contact information:   [address] [place] [address] [phone]       Follow up with Donaciano Eva, MD On 12/19/2015.   Specialty:  Obstetrics and Gynecology   Why:  at 3:15pm at the [facility] information:   [place] [address] [phone]       Signed: Pedro Earls 12/03/2015, 11:57 AM

## 2015-12-03 NOTE — Progress Notes (Signed)
1 Day Post-Op Procedure(s) (LRB): XI ROBOTIC ASSISTED BILATERAL SALPINGO OOPHORECTOMY (Bilateral) LAPAROSCOPIC LYSIS OF ADHESION (N/A)  Subjective: Patient reports doing well this am.  Tolerated breakfast with no nausea or emesis reported.  Ambulating without assistance.  Reporting mild abdominal soreness.  Relief with percocet use.  No other concerns voiced.  Denies chest pain, dyspnea, passing flatus, or having a bowel movement.     Objective: Vital signs in last 24 hours: Temp:  [97.6 F (36.4 C)-98.3 F (36.8 C)] 98.3 F (36.8 C) (12/21 0451) Pulse Rate:  [68-92] 83 (12/21 0451) Resp:  [9-20] 16 (12/21 0451) BP: (138-160)/(48-71) 138/48 mmHg (12/21 0451) SpO2:  [94 %-100 %] 100 % (12/21 0451) Weight:  [200 lb 9.9 oz (91 kg)-267 lb (121.11 kg)] 267 lb (121.11 kg) (12/21 0403) Last BM Date: 12/01/15  Intake/Output from previous day: 12/20 0701 - 12/21 0700 In: 2694.2 [P.O.:450; I.V.:2144.2] Out: 750 [Urine:700; Blood:50]  Physical Examination: General: alert, cooperative and no distress Resp: clear to auscultation bilaterally Cardio: regular rate and rhythm, S1, S2 normal, no murmur, click, rub or gallop GI: soft, non-tender; bowel sounds normal; no masses,  no organomegaly and incision: Lap sites with dermabond without erythema or drainage Extremities: extremities normal, atraumatic, no cyanosis or edema  Labs: WBC/Hgb/Hct/Plts:  12.3/9.8/29.9/256 (12/21 0522) BUN/Cr/glu/ALT/AST/amyl/lip:  15/0.62/--/--/--/--/-- (12/21 0522)  Assessment: 69 y.o. s/p Procedure(s): XI ROBOTIC ASSISTED BILATERAL SALPINGO OOPHORECTOMY LAPAROSCOPIC LYSIS OF ADHESION, Hernia Repair by Dr. Johnathan Stevens: stable Pain:  Pain is well-controlled on PRN medications.  Heme: Hgb 9.8 and Hct 29.9 this am.  Stable post-operatively.  CV: BP and HR stable post-operatively.  Hx HTN and murmur.  GI:  Tolerating po: Yes    GU:  Adequate output reported.  Voiding since foley removal.   FEN: Stable  post-operatively.  Prophylaxis: intermittent pneumatic compression boots and Lovenox injections.  Plan: Cleared for discharge from [facility] IS use, deep breathing, and coughing Continue post-operative plan of care per Dr. Delsa Sale and Dr. Hassell Done    LOS: 1 day    Jackie Stevens DEAL 12/03/2015, 9:50 AM

## 2015-12-03 NOTE — Op Note (Signed)
Surgeon: Kaylyn Lim, MD, FACS  Asst:  none  Anes:  general  Procedure: Diagnostic laparoscopy  Diagnosis: Ventral herniae  Complications: none  EBL:   00 cc  Drains: none  Description of Procedure:  The patient was taken to OR 1 at [facility].  After anesthesia was administered and the patient was prepped a timeout was performed.  This was a combined procedure with Dr. Denman George who was set to remove her ovaries.  I entered the abdomen with a 5 mm Optiview through the left upper quadrant without difficulty.  There were many omental adhesions up in to several hernia in the lower abdomen where her laparotomy and ostomy had been.  After these were all taken down I determined that the operative correction would likely require several hour dissection and likely lateral releases and mesh implantation.  Dr. Serita Grit procedure was the primary procedure and this was accomplished.  I felt that we could come back and discuss elective repair after she had healed from her oopherectomies.  Additional trocars were inserted by Dr. Denman George and at the end of the procedure they were withdrawn and the incisions closed.   The patient tolerated the procedure well and was taken to the PACU in stable condition.     Matt B. Hassell Done, [place], [facility], [place]

## 2015-12-09 ENCOUNTER — Other Ambulatory Visit: Payer: Self-pay | Admitting: Family Medicine

## 2015-12-09 MED ORDER — TRAMADOL HCL 50 MG PO TABS: 50.0000 mg | ORAL_TABLET | Freq: Four times a day (QID) | ORAL | Status: DC | PRN

## 2015-12-09 NOTE — Telephone Encounter (Signed)
Faxed hardcopy for Tramad[facility] to [facility] [place]. #30 with 0 refills was faxed to [phone], received fax confirmation from pharmacy prescription received.

## 2015-12-09 NOTE — Telephone Encounter (Signed)
Requesting: Tramadol Contract  None UDS   None Last OV   10/10/2015 Last Refill   #30 with 0 refills on 09/17/2015  Please Advise

## 2015-12-16 ENCOUNTER — Telehealth: Payer: Self-pay | Admitting: Gynecologic Oncology

## 2015-12-16 NOTE — Telephone Encounter (Signed)
Returned call to patient.  Patient asking when she can drive.  Advised she could drive at this time as long as she is not taking pain medications and her reaction time is where it was previously before surgery.

## 2015-12-19 ENCOUNTER — Encounter: Payer: Self-pay | Admitting: Gynecologic Oncology

## 2015-12-19 ENCOUNTER — Ambulatory Visit: Payer: PPO | Attending: Gynecologic Oncology | Admitting: Gynecologic Oncology

## 2015-12-19 VITALS — BP 150/56 | HR 77 | Temp 98.1°F | Resp 18 | Ht 62.0 in | Wt 201.3 lb

## 2015-12-19 DIAGNOSIS — N83201 Unspecified ovarian cyst, right side: Secondary | ICD-10-CM | POA: Insufficient documentation

## 2015-12-19 DIAGNOSIS — D270 Benign neoplasm of right ovary: Secondary | ICD-10-CM | POA: Diagnosis not present

## 2015-12-19 DIAGNOSIS — K469 Unspecified abdominal hernia without obstruction or gangrene: Secondary | ICD-10-CM

## 2015-12-19 NOTE — Patient Instructions (Signed)
Follow up with Dr. Julien Girt and Dr. Hassell Done as scheduled.  Please call for any questions or concerns.

## 2015-12-19 NOTE — Progress Notes (Signed)
FOLLOW UP POSTOP  Assessment:    69 y.o. year old with a benign right ovarian cystadenoma.   S/p robotic assisted lysis of adhesions and BSO on 12/02/15.   Plan: 1) Pathology reports reviewed today 2) Treatment counseling - No specific follow up or treatment required for this benign mass. She was given the opportunity to ask questions, which were answered to her satisfaction, and she is agreement with the above mentioned plan of care.  3)  Return to clinic on a prn basis. Will return to see Dr Julien Girt for well woman care. 4) follow up with Dr Hassell Done as scheduled for long term management of hernia.  HPI:  Jackie Stevens is a 70 y.o. year old initially seen in consultation on 09/05/15 referred by Dr Julien Girt for a right ovarian mass. We had a low suspcion for malignancy and she had a large hernia. Therefore a combined case was arranged with Dr Hassell Done from [place] to potentially repair the hernia at the same time.  She then underwent a robotic assisted laparoscopic lysis of adhesions and BSO on [id number] without complications.  She had significant adhesive disease between her colon and her ovary, however this was taken down sharply without issue. Her hernia was far more significant when appreciated by laparoscopic view after adhesions had been taken down. In order to repair this defect it would require a major procedure (likely via laparotomy). Her postoperative course was uncomplicated.  Her final pathology revealed benign cystadenofibroma.  She is seen today for a postoperative check and to discuss her pathology results and ongoing plan.  Since discharge from the hospital, she is feeling well.  She has improving appetite, normal bowel and bladder function, and pain controlled with minimal PO medication. She continues to have left lower quadrant pain which was the same discomfort she felt preoperatively. She has no other complaints today.    Review of systems: Constitutional:  She has no weight gain or  weight loss. She has no fever or chills. Eyes: No blurred vision Ears, Nose, Mouth, Throat: No dizziness, headaches or changes in hearing. No mouth sores. Cardiovascular: No chest pain, palpitations or edema. Respiratory:  No shortness of breath, wheezing or cough Gastrointestinal: She has normal bowel movements without diarrhea or constipation. She denies any nausea or vomiting. She denies blood in her stool or heart burn. Genitourinary:  She denies pelvic pain, pelvic pressure or changes in her urinary function. She has no hematuria, dysuria, or incontinence. She has no irregular vaginal bleeding or vaginal discharge Musculoskeletal: Denies muscle weakness or joint pains.  Skin:  She has no skin changes, rashes or itching Neurological:  Denies dizziness or headaches. No neuropathy, no numbness or tingling. Psychiatric:  She denies depression or anxiety. Hematologic/Lymphatic:   No easy bruising or bleeding   Physical Exam: Blood pressure 150/56, pulse 77, temperature 98.1 F (36.7 C), temperature source Oral, resp. rate 18, height 5\' 2"  (1.575 m), weight 201 lb 4.8 oz (91.309 kg), SpO2 98 %. General: Well dressed, well nourished in no apparent distress.   HEENT:  Normocephalic and atraumatic, no lesions.  Extraocular muscles intact. Sclerae anicteric. Pupils equal, round, reactive. No mouth sores or ulcers. Thyroid is normal size, not nodular, midline. Skin:  No lesions or rashes. Abdomen:  Soft, nontender, nondistended.  No palpable masses.  No hepatosplenomegaly.  No ascites. Normal bowel sounds.  No hernias.  Incisions are well healed Genitourinary:deferred Extremities: No cyanosis, clubbing or edema.  No calf tenderness or erythema. No palpable cords.  Psychiatric: Mood and affect are appropriate. Neurological: Awake, alert and oriented x 3. Sensation is intact, no neuropathy.  Musculoskeletal: No pain, normal strength and range of motion.  Donaciano Eva, MD

## 2016-01-09 ENCOUNTER — Other Ambulatory Visit: Payer: Self-pay | Admitting: Family Medicine

## 2016-01-09 NOTE — Telephone Encounter (Signed)
Refilled patients tramadol #30 with 0 rf

## 2016-01-09 NOTE — Telephone Encounter (Signed)
OK to refill her Tramadol with same strength, same sig, same number 

## 2016-01-12 NOTE — Telephone Encounter (Signed)
Faxed [facility] [place]  

## 2016-01-22 ENCOUNTER — Other Ambulatory Visit: Payer: Self-pay | Admitting: Family Medicine

## 2016-01-22 MED ORDER — HYDROCHLOROTHIAZIDE 25 MG PO TABS: 25.0000 mg | ORAL_TABLET | Freq: Every day | ORAL | Status: DC

## 2016-01-22 MED ORDER — VALSARTAN 320 MG PO TABS: 320.0000 mg | ORAL_TABLET | Freq: Every day | ORAL | Status: DC

## 2016-02-07 ENCOUNTER — Other Ambulatory Visit: Payer: Self-pay | Admitting: Family Medicine

## 2016-02-09 DIAGNOSIS — L659 Nonscarring hair loss, unspecified: Secondary | ICD-10-CM | POA: Diagnosis not present

## 2016-02-09 DIAGNOSIS — D649 Anemia, unspecified: Secondary | ICD-10-CM | POA: Diagnosis not present

## 2016-02-09 DIAGNOSIS — E890 Postprocedural hypothyroidism: Secondary | ICD-10-CM | POA: Diagnosis not present

## 2016-02-09 DIAGNOSIS — D6489 Other specified anemias: Secondary | ICD-10-CM | POA: Diagnosis not present

## 2016-02-09 DIAGNOSIS — E784 Other hyperlipidemia: Secondary | ICD-10-CM | POA: Diagnosis not present

## 2016-02-09 DIAGNOSIS — G4730 Sleep apnea, unspecified: Secondary | ICD-10-CM | POA: Diagnosis not present

## 2016-02-09 DIAGNOSIS — Z6837 Body mass index (BMI) 37.0-37.9, adult: Secondary | ICD-10-CM | POA: Diagnosis not present

## 2016-02-09 DIAGNOSIS — N200 Calculus of kidney: Secondary | ICD-10-CM | POA: Diagnosis not present

## 2016-02-09 DIAGNOSIS — Z1389 Encounter for screening for other disorder: Secondary | ICD-10-CM | POA: Diagnosis not present

## 2016-02-09 DIAGNOSIS — J45909 Unspecified asthma, uncomplicated: Secondary | ICD-10-CM | POA: Diagnosis not present

## 2016-02-09 DIAGNOSIS — E871 Hypo-osmolality and hyponatremia: Secondary | ICD-10-CM | POA: Diagnosis not present

## 2016-02-09 DIAGNOSIS — C73 Malignant neoplasm of thyroid gland: Secondary | ICD-10-CM | POA: Diagnosis not present

## 2016-02-09 DIAGNOSIS — I10 Essential (primary) hypertension: Secondary | ICD-10-CM | POA: Diagnosis not present

## 2016-02-09 NOTE — Telephone Encounter (Signed)
Faxed [facility]copy [facility] [place] 

## 2016-02-11 ENCOUNTER — Other Ambulatory Visit: Payer: Self-pay | Admitting: Family Medicine

## 2016-02-16 ENCOUNTER — Ambulatory Visit (INDEPENDENT_AMBULATORY_CARE_PROVIDER_SITE_OTHER): Payer: PPO | Admitting: Medical

## 2016-02-16 ENCOUNTER — Encounter: Payer: Self-pay | Admitting: Medical

## 2016-02-16 VITALS — BP 130/80 | HR 77 | Temp 98.9°F | Ht 62.0 in | Wt 202.4 lb

## 2016-02-16 DIAGNOSIS — J309 Allergic rhinitis, unspecified: Secondary | ICD-10-CM

## 2016-02-16 DIAGNOSIS — R059 Cough, unspecified: Secondary | ICD-10-CM

## 2016-02-16 DIAGNOSIS — R05 Cough: Secondary | ICD-10-CM | POA: Diagnosis not present

## 2016-02-16 DIAGNOSIS — J0100 Acute maxillary sinusitis, unspecified: Secondary | ICD-10-CM | POA: Diagnosis not present

## 2016-02-16 DIAGNOSIS — J209 Acute bronchitis, unspecified: Secondary | ICD-10-CM

## 2016-02-16 MED ORDER — AZITHROMYCIN 250 MG PO TABS: ORAL_TABLET | ORAL | Status: DC

## 2016-02-16 MED ORDER — BENZONATATE 100 MG PO CAPS: 100.0000 mg | ORAL_CAPSULE | Freq: Three times a day (TID) | ORAL | Status: DC | PRN

## 2016-02-16 NOTE — Progress Notes (Signed)
Subjective:    Patient ID: Jackie Stevens, female    DOB: 1946-08-12, 70 y.o.   MRN: [id number]  HPI  Pt in with 5 days of feeling sick. Pt states started 5 days ago with some chills, fatigue  and cough. No body aches. Pt has some productive cough. Some sinus pressure, nasal congestion and ear pain.   No sneezing or itching eyes. But her eyes are very watery.  Pt has allergies this time of year. She has saline nasal spray. She has not used any flonase recently.   Review of Systems  Constitutional: Positive for chills and fatigue. Negative for fever.  HENT: Positive for congestion and sinus pressure. Negative for ear pain, postnasal drip, sneezing and sore throat.   Respiratory: Negative for cough, chest tightness, shortness of breath and wheezing.   Cardiovascular: Negative for chest pain and palpitations.  Gastrointestinal: Negative for abdominal pain, diarrhea, constipation and rectal pain.  Musculoskeletal: Negative for myalgias and arthralgias.  Skin: Negative for rash.  Neurological: Negative for dizziness and headaches.  Hematological: Negative for adenopathy. Does not bruise/bleed easily.  Psychiatric/Behavioral: Negative for behavioral problems and confusion.    Past Medical History  Diagnosis Date  . Allergic rhinitis   . GERD (gastroesophageal reflux disease)   . Hyperlipidemia   . Chicken pox as a child  . Measles as a child  . Insomnia   . Thyroid disease   . Constipation   . Diverticulitis 10/25/2012    pt. reports that a drain was placed - 09/2012    . Abnormal cervical cytology 10/25/2012    Follows with Dr Leavy Cella of Gyn  . Asthma   . Heart murmur   . Complication of anesthesia   . PONV (postoperative nausea and vomiting)   . HTN (hypertension)     stress test completed by Anselm Lis, diagnosed as GERD  . Thyroid cancer ([place]) 1980's  . Hypothyroidism   . H/O hiatal hernia   . External hemorrhoid, bleeding     "sometimes" (12-26-12)  .  Arthritis     "knees; right thumb; shoulders" (12-26-12)  . Kidney stones 1970's    "passed on their own" (12/26/2012)  . OSA on CPAP   . UTI (urinary tract infection) 04/02/2013  . Anemia 10/06/2013  . Preventative health care 10/06/2013    [facility] of Dermatology Pneumovax in 2012  . Low back pain 06/09/2014  . Medicare annual wellness visit, subsequent 10/06/2013    [facility]inhoffer of Dermatology Pneumovax in 2012   . Paresthesia 03/23/2015    Left face  . Anginal pain ([place])     pt has history of CP states had cardiac workup with no specific issues identified   . Shortness of breath dyspnea     using stairs  . Anxiety     when increased stress   . Incontinence     Social History   Social History  . Marital Status: Married    Spouse Name: N/A  . Number of Children: N/A  . Years of Education: N/A   Occupational History  . Not on file.   Social History Main Topics  . Smoking status: Never Smoker   . Smokeless tobacco: Never Used  . Alcohol Use: 0.0 oz/week    0 Standard drinks or equivalent per week     Comment: 2012-12-26 "glass of wine q night q other month or so" beer with pizza   . Drug Use: No  . Sexual Activity: No   Other  Topics Concern  . Not on file   Social History Narrative   Married    children    Past Surgical History  Procedure Laterality Date  . Thyroidectomy, partial  1988    "then did iodine to remove the rest" (12/19/2012)  . Sigmoid resection / rectopexy  12/19/2012  . Colon surgery    . Tonsillectomy  1951?  Marland Kitchen Cholecystectomy  1990  . Vaginal hysterectomy  1970's    "still have my ovaries" (12/19/2012)  . Dilation and curettage of uterus  1960's    "lots of them; had miscarriages" (12/19/2012)  . Transrectal drainage of pelvic abscess  10/27/2012  . Colostomy revision  12/19/2012    Procedure: COLON RESECTION SIGMOID;  Surgeon: Gwenyth Ober, MD;  Location: [facility];  Service: General;  Laterality: N/A;  . Cystoscopy with stent placement  12/19/2012     Procedure: CYSTOSCOPY WITH STENT PLACEMENT;  Surgeon: Hanley Ben, MD;  Location: [facility];  Service: Urology;  Laterality: N/A;  . Lysis of adhesion N/A 12/02/2015    Procedure: LAPAROSCOPIC LYSIS OF ADHESION;  Surgeon: Johnathan Hausen, MD;  Location: [facility];  Service: General;  Laterality: N/A;  . Robotic assisted bilateral salpingo oopherectomy Bilateral 12/02/2015    Procedure: XI ROBOTIC ASSISTED BILATERAL SALPINGO OOPHORECTOMY;  Surgeon: Everitt Amber, MD;  Location: [facility];  Service: Gynecology;  Laterality: Bilateral;    Family History  Problem Relation Age of Onset  . Heart disease Father   . Pneumonia Father   . Hypertension Father   . Hyperlipidemia Father   . Cancer Father     skin  . Stroke Father   . Cancer Paternal Aunt   . Alzheimer's disease Mother   . Heart disease Mother   . Emphysema Brother     marijuana and cigarettes  . Alcohol abuse Brother   . Hearing loss Brother   . Diabetes Maternal Grandmother   . Alzheimer's disease Paternal Grandmother   . Cancer Paternal Grandmother     lung?- smoker  . Hyperlipidemia Paternal Grandmother   . Heart attack Paternal Grandfather   . Alcohol abuse Paternal Grandfather   . Neurofibromatosis Son     schwanomatosis  . Neurofibromatosis Son     swanomatosis    Allergies  Allergen Reactions  . Neomycin-Bacitracin Zn-Polymyx Rash    Polysporin- is tolerated   . Niacin Other (See Comments) and Cough    "cough til I threw up" (12/19/2012)  . Ciprofloxacin Hives    Got cipro and flagyl at same time, localized hives to IV arm  . Flagyl [Metronidazole] Hives    Got cipro and flagyl at same time, localized hives to IV arm    Current Outpatient Prescriptions on File Prior to Visit  Medication Sig Dispense Refill  . albuterol (PROVENTIL HFA;VENTOLIN HFA) 108 (90 BASE) MCG/ACT inhaler Inhale 2 puffs into the lungs every 6 (six) hours as needed for wheezing. 1 Inhaler 2  . aspirin EC 81 MG tablet Take 81 mg by mouth daily.  Reported on 12/19/2015    . cetirizine (ZYRTEC) 10 MG tablet Take 1 tablet (10 mg total) by mouth daily. (Patient taking differently: Take 10 mg by mouth daily as needed for allergies. ) 30 tablet 11  . docusate sodium (COLACE) 100 MG capsule Take 100 mg by mouth 2 (two) times daily.    . fluticasone (FLONASE) 50 MCG/ACT nasal spray Place 2 sprays into both nostrils daily. (Patient taking differently: Place 2 sprays into both nostrils daily  as needed for allergies. ) 16 g 2  . hydrochlorothiazide (HYDRODIURIL) 25 MG tablet Take 1 tablet (25 mg total) by mouth daily. 90 tablet 2  . KRILL OIL PO Take 750 mg by mouth daily.     Marland Kitchen levothyroxine (SYNTHROID, LEVOTHROID) 150 MCG tablet Take 150-225 mcg by mouth daily before breakfast. 1 tablet (150 mcg) every day except Sunday, 1.5 tablet (225 mcg) on Sunday    . naproxen (NAPROSYN) 375 MG tablet Take 375 mg by mouth 2 (two) times daily with a meal.    . NITROSTAT 0.4 MG SL tablet PLACE 1 TABLET UNDER THE TONGUE EVERY 5 MINUTES AS NEEDED FOR CHEST PAIN 25 tablet 0  . omeprazole (PRILOSEC) 20 MG capsule TAKE 1 CAPSULE EVERY MORNING AT BREAFAST 90 capsule 2  . Polyethyl Glycol-Propyl Glycol 0.4-0.3 % GEL Apply 1 drop to eye 2 (two) times daily as needed (dry eyes).     . polyethylene glycol (MIRALAX / GLYCOLAX) packet Take 17 g by mouth daily.    . Probiotic Product (PROBIOTIC ADVANCED PO) Take 2 tablets by mouth at bedtime. Digestive Advantage    . psyllium (REGULOID) 0.52 G capsule Take 0.52 g by mouth at bedtime.    . ranitidine (ZANTAC) 150 MG tablet TAKE 1 TABLET BY MOUTH AT BEDTIME 90 tablet 2  . simvastatin (ZOCOR) 40 MG tablet TAKE 1 TABLET BY MOUTH AT BEDTIME 90 tablet 2  . sodium chloride (OCEAN) 0.65 % SOLN nasal spray Place 1 spray into both nostrils 2 (two) times daily as needed for congestion.     . traMADol (ULTRAM) 50 MG tablet TAKE 1 TABLET BY MOUTH EVERY 6 HOURS AS NEEDED FOR MODERATE PAIN 30 tablet 0  . TURMERIC PO Take 1 tablet by mouth  daily. Reported on 12/19/2015    . valsartan (DIOVAN) 320 MG tablet Take 1 tablet (320 mg total) by mouth daily. 90 tablet 2  . vitamin B-12 (CYANOCOBALAMIN) 1000 MCG tablet Take 1,000 mcg by mouth daily.     . Vitamin D, Cholecalciferol, 1000 UNITS CAPS Take 1 capsule by mouth daily.     No current facility-administered medications on file prior to visit.    BP 130/80 mmHg  Pulse 77  Temp(Src) 98.9 F (37.2 C) (Oral)  Ht 5\' 2"  (1.575 m)  Wt 202 lb 6.4 oz (91.808 kg)  BMI 37.01 kg/m2  SpO2 98%       Objective:   Physical Exam  General  Mental Status - Alert. General Appearance - Well groomed. Not in acute distress.  Skin Rashes- No Rashes.  HEENT Head- Normal. Ear Auditory Canal - Left- Normal. Right - Normal.Tympanic Membrane- Left- Normal. Right- Normal. Eye Sclera/Conjunctiva- Left- Normal. Right- Normal. Nose & Sinuses Nasal Mucosa- Left-  Boggy and Congested. Right-  Boggy and  Congested.Bilateral no  maxillary and no frontal sinus pressure.(sinus pressure worse in afternon) Mouth & Throat Lips: Upper Lip- Normal: no dryness, cracking, pallor, cyanosis, or vesicular eruption. Lower Lip-Normal: no dryness, cracking, pallor, cyanosis or vesicular eruption. Buccal Mucosa- Bilateral- No Aphthous ulcers. Oropharynx- No Discharge or Erythema. Tonsils: Characteristics- Bilateral- No Erythema or Congestion. Size/Enlargement- Bilateral- No enlargement. Discharge- bilateral-None.  Neck Neck- Supple. No Masses.   Chest and Lung Exam Auscultation: Breath Sounds:-Clear even and unlabored.  Cardiovascular Auscultation:Rythm- Regular, rate and rhythm. Murmurs & Other Heart Sounds:Ausculatation of the heart reveal- No Murmurs.  Lymphatic Head & Neck General Head & Neck Lymphatics: Bilateral: Description- No Localized lymphadenopathy.       Assessment &  Plan:  You appear to have bronchitis and sinusitis. Rest hydrate and tylenol for fever. I am prescribing cough  medicine benzonatate, and azithromycin antibiotic. For your nasal congestion you could use flonase.  You should gradually get better. If not then notify us and would recommend a chest xray.  Consider use of flonase over next 2 months as you may have started with allergic component and pollen will fall in near future.  Follow up in 7-10 days or as needed

## 2016-02-16 NOTE — Progress Notes (Signed)
Pre visit review using our clinic review tool, if applicable. No additional management support is needed unless otherwise documented below in the visit note. 

## 2016-02-16 NOTE — Patient Instructions (Addendum)
You appear to have bronchitis and sinusitis. Rest hydrate and tylenol for fever. I am prescribing cough medicine benzonatate, and azithromycin antibiotic. For your nasal congestion you could use flonase.  You should gradually get better. If not then notify us and would recommend a chest xray.  Consider use of flonase over next 2 months as you may have started with allergic component and pollen will fall in near future.  Follow up in 7-10 days or as needed

## 2016-02-19 ENCOUNTER — Telehealth: Payer: Self-pay

## 2016-02-19 NOTE — Telephone Encounter (Signed)
[facility] note received via fax  Call:   Date: 02/15/16 Time: 10:27:25am   Caller:  Patient  Return number:  [phone]   Nurse: Tamala Fothergill, Rn   Chief Complaint:  Flu Symptoms   Reason for call:  Caller states she has flu since Thurs, Reports that she was not seen by MD and the flu was note confirmed, she thought she had a cold, not improved.  Reports that she has a congested nose, runny nose, coughing with productive (green), reports fever 99.4, denies body aches.  Denies vomiting/diarrhea, but has nausea.  Symptoms began Thursday, feels worse today than Thursday.  Has been taking Delsym for cough.  Also taking Oscillococcinun.    Related visit to physician within the last 2 weeks:  Yes  Guideline: Common Cold--[place]  Disposition:  See Physician within 24 hours.

## 2016-02-19 NOTE — Telephone Encounter (Signed)
Pt saw Mackie Pai, PA-C on 02/16/16.

## 2016-02-23 ENCOUNTER — Other Ambulatory Visit (INDEPENDENT_AMBULATORY_CARE_PROVIDER_SITE_OTHER): Payer: PPO

## 2016-02-23 DIAGNOSIS — E782 Mixed hyperlipidemia: Secondary | ICD-10-CM | POA: Diagnosis not present

## 2016-02-23 DIAGNOSIS — D649 Anemia, unspecified: Secondary | ICD-10-CM

## 2016-02-23 DIAGNOSIS — E559 Vitamin D deficiency, unspecified: Secondary | ICD-10-CM

## 2016-02-23 DIAGNOSIS — I10 Essential (primary) hypertension: Secondary | ICD-10-CM | POA: Diagnosis not present

## 2016-02-23 DIAGNOSIS — E039 Hypothyroidism, unspecified: Secondary | ICD-10-CM | POA: Diagnosis not present

## 2016-02-23 DIAGNOSIS — E079 Disorder of thyroid, unspecified: Secondary | ICD-10-CM

## 2016-02-23 LAB — TSH: TSH: 0.31 u[IU]/mL — ABNORMAL LOW (ref 0.35–4.50)

## 2016-02-23 LAB — LIPID PANEL
Cholesterol: 123 mg/dL (ref 0–200)
HDL: 38.4 mg/dL — ABNORMAL LOW (ref >39.00)
LDL Cholesterol: 62 mg/dL (ref 0–99)
NonHDL: 84.92
Total CHOL/HDL Ratio: 3
Triglycerides: 116 mg/dL (ref 0.0–149.0)
VLDL: 23.2 mg/dL (ref 0.0–40.0)

## 2016-02-23 LAB — VITAMIN B12: Vitamin B-12: >1500 pg/mL — ABNORMAL HIGH (ref 211–911)

## 2016-02-23 LAB — COMPREHENSIVE METABOLIC PANEL
ALT: 12 U/L (ref 0–35)
AST: 16 U/L (ref 0–37)
Albumin: 4.1 g/dL (ref 3.5–5.2)
Alkaline Phosphatase: 81 U/L (ref 39–117)
BUN: 13 mg/dL (ref 6–23)
CO2: 30 mEq/L (ref 19–32)
Calcium: 8.9 mg/dL (ref 8.4–10.5)
Chloride: 94 mEq/L — ABNORMAL LOW (ref 96–112)
Creatinine, Ser: 0.7 mg/dL (ref 0.40–1.20)
GFR: 87.54 mL/min (ref >60.00)
Glucose, Bld: 99 mg/dL (ref 70–99)
Potassium: 3.9 mEq/L (ref 3.5–5.1)
Sodium: 132 mEq/L — ABNORMAL LOW (ref 135–145)
Total Bilirubin: 0.3 mg/dL (ref 0.2–1.2)
Total Protein: 6.8 g/dL (ref 6.0–8.3)

## 2016-02-23 LAB — CBC
HCT: 37.2 % (ref 36.0–46.0)
Hemoglobin: 12.3 g/dL (ref 12.0–15.0)
MCHC: 33.2 g/dL (ref 30.0–36.0)
MCV: 81.6 fl (ref 78.0–100.0)
Platelets: 307 10*3/uL (ref 150.0–400.0)
RBC: 4.56 Mil/uL (ref 3.87–5.11)
RDW: 14.5 % (ref 11.5–15.5)
WBC: 6.4 10*3/uL (ref 4.0–10.5)

## 2016-02-23 LAB — VITAMIN D 25 HYDROXY (VIT D DEFICIENCY, FRACTURES): VITD: 31.85 ng/mL (ref 30.00–100.00)

## 2016-02-25 ENCOUNTER — Encounter: Payer: Self-pay | Admitting: Behavioral Health

## 2016-02-25 ENCOUNTER — Other Ambulatory Visit: Payer: Self-pay | Admitting: Family Medicine

## 2016-02-25 ENCOUNTER — Telehealth: Payer: Self-pay | Admitting: Behavioral Health

## 2016-02-25 NOTE — Telephone Encounter (Signed)
Pre-Visit Call completed with patient and chart updated.   Pre-Visit Info documented in Specialty Comments under [employer].    

## 2016-02-25 NOTE — Telephone Encounter (Signed)
Refilled patients rx with #90 with 0

## 2016-02-26 ENCOUNTER — Encounter: Payer: Self-pay | Admitting: Family Medicine

## 2016-02-26 ENCOUNTER — Ambulatory Visit (HOSPITAL_BASED_OUTPATIENT_CLINIC_OR_DEPARTMENT_OTHER)
Admission: RE | Admit: 2016-02-26 | Discharge: 2016-02-26 | Disposition: A | Discharge status: Home / Self Care | Payer: PPO | Source: Ambulatory Visit | Attending: Family Medicine | Admitting: Family Medicine

## 2016-02-26 ENCOUNTER — Ambulatory Visit (INDEPENDENT_AMBULATORY_CARE_PROVIDER_SITE_OTHER): Payer: PPO | Admitting: Family Medicine

## 2016-02-26 VITALS — BP 153/71 | HR 79 | Temp 98.0°F | Ht 62.0 in | Wt 200.2 lb

## 2016-02-26 DIAGNOSIS — M5418 Radiculopathy, sacral and sacrococcygeal region: Secondary | ICD-10-CM

## 2016-02-26 DIAGNOSIS — M5410 Radiculopathy, site unspecified: Secondary | ICD-10-CM

## 2016-02-26 DIAGNOSIS — R059 Cough, unspecified: Secondary | ICD-10-CM

## 2016-02-26 DIAGNOSIS — E782 Mixed hyperlipidemia: Secondary | ICD-10-CM

## 2016-02-26 DIAGNOSIS — E079 Disorder of thyroid, unspecified: Secondary | ICD-10-CM | POA: Diagnosis not present

## 2016-02-26 DIAGNOSIS — I10 Essential (primary) hypertension: Secondary | ICD-10-CM

## 2016-02-26 DIAGNOSIS — M435X6 Other recurrent vertebral dislocation, lumbar region: Secondary | ICD-10-CM | POA: Insufficient documentation

## 2016-02-26 DIAGNOSIS — G4733 Obstructive sleep apnea (adult) (pediatric): Secondary | ICD-10-CM

## 2016-02-26 DIAGNOSIS — K5900 Constipation, unspecified: Secondary | ICD-10-CM

## 2016-02-26 DIAGNOSIS — K219 Gastro-esophageal reflux disease without esophagitis: Secondary | ICD-10-CM

## 2016-02-26 DIAGNOSIS — R05 Cough: Secondary | ICD-10-CM

## 2016-02-26 DIAGNOSIS — Z0000 Encounter for general adult medical examination without abnormal findings: Secondary | ICD-10-CM | POA: Insufficient documentation

## 2016-02-26 DIAGNOSIS — E538 Deficiency of other specified B group vitamins: Secondary | ICD-10-CM

## 2016-02-26 DIAGNOSIS — Z1159 Encounter for screening for other viral diseases: Secondary | ICD-10-CM

## 2016-02-26 DIAGNOSIS — M47896 Other spondylosis, lumbar region: Secondary | ICD-10-CM | POA: Insufficient documentation

## 2016-02-26 DIAGNOSIS — M47816 Spondylosis without myelopathy or radiculopathy, lumbar region: Secondary | ICD-10-CM | POA: Diagnosis not present

## 2016-02-26 MED ORDER — AMOXICILLIN-POT CLAVULANATE 875-125 MG PO TABS: 1.0000 | ORAL_TABLET | Freq: Two times a day (BID) | ORAL | Status: DC

## 2016-02-26 MED FILL — AMOX-CLAV 875-125 MG TABLET: 875-125 | 10 days supply | Qty: 20 | Fill #0

## 2016-02-26 NOTE — Assessment & Plan Note (Signed)
No trauma or fall, sacral xray cannot rule out fracture will proceed with MRI if patient agrees. Encouraged topical treatments for now.

## 2016-02-26 NOTE — Assessment & Plan Note (Signed)
Colace bid, Miralax daily, probiotics, Linzess weekly

## 2016-02-26 NOTE — Assessment & Plan Note (Signed)
On Levothyroxine, continue to monitor. TSH 0.31 on Synthroid 150 daily asymptomatic no changes, continues to follow with endocrinology

## 2016-02-26 NOTE — Assessment & Plan Note (Addendum)
Avoid offending foods, take probiotics. Do not eat large meals in late evening and consider raising head of bed.  

## 2016-02-26 NOTE — Patient Instructions (Addendum)
Cut B12 vitamin down to 5 times a week.  NOW Probiotic Vitamin. Available at [employer]. Zinc, Vitamins C, Aged garlic, and Elderberry Liquid/Vitamins. [email or web address]  Hydrogen peroxide 3-5 drops to each ear twice a week  Preventive Care for Adults, Female A healthy lifestyle and preventive care can promote health and wellness. Preventive health guidelines for women include the following key practices.  A routine yearly physical is a good way to check with your health care provider about your health and preventive screening. It is a chance to share any concerns and updates on your health and to receive a thorough exam.  Visit your dentist for a routine exam and preventive care every 6 months. Brush your teeth twice a day and floss once a day. Good oral hygiene prevents tooth decay and gum disease.  The frequency of eye exams is based on your age, health, family medical history, use of contact lenses, and other factors. Follow your health care provider's recommendations for frequency of eye exams.  Eat a healthy diet. Foods like vegetables, fruits, whole grains, low-fat dairy products, and lean protein foods contain the nutrients you need without too many calories. Decrease your intake of foods high in solid fats, added sugars, and salt. Eat the right amount of calories for you.Get information about a proper diet from your health care provider, if necessary.  Regular physical exercise is one of the most important things you can do for your health. Most adults should get at least 150 minutes of moderate-intensity exercise (any activity that increases your heart rate and causes you to sweat) each week. In addition, most adults need muscle-strengthening exercises on 2 or more days a week.  Maintain a healthy weight. The body mass index (BMI) is a screening tool to identify possible weight problems. It provides an estimate of body fat based on height and weight. Your health care provider can  find your BMI and can help you achieve or maintain a healthy weight.For adults 20 years and older:  A BMI below 18.5 is considered underweight.  A BMI of 18.5 to 24.9 is normal.  A BMI of 25 to 29.9 is considered overweight.  A BMI of 30 and above is considered obese.  Maintain normal blood lipids and cholesterol levels by exercising and minimizing your intake of saturated fat. Eat a balanced diet with plenty of fruit and vegetables. Blood tests for lipids and cholesterol should begin at age 78 and be repeated every 5 years. If your lipid or cholesterol levels are high, you are over 50, or you are at high risk for heart disease, you may need your cholesterol levels checked more frequently.Ongoing high lipid and cholesterol levels should be treated with medicines if diet and exercise are not working.  If you smoke, find out from your health care provider how to quit. If you do not use tobacco, do not start.  Lung cancer screening is recommended for adults aged 62-80 years who are at high risk for developing lung cancer because of a history of smoking. A yearly low-dose CT scan of the lungs is recommended for people who have at least a 30-pack-year history of smoking and are a current smoker or have quit within the past 15 years. A pack year of smoking is smoking an average of 1 pack of cigarettes a day for 1 year (for example: 1 pack a day for 30 years or 2 packs a day for 15 years). Yearly screening should continue until the smoker has stopped smoking  for at least 15 years. Yearly screening should be stopped for people who develop a health problem that would prevent them from having lung cancer treatment.  If you are pregnant, do not drink alcohol. If you are breastfeeding, be very cautious about drinking alcohol. If you are not pregnant and choose to drink alcohol, do not have more than 1 drink per day. One drink is considered to be 12 ounces (355 mL) of beer, 5 ounces (148 mL) of wine, or 1.5  ounces (44 mL) of liquor.  Avoid use of street drugs. Do not share needles with anyone. Ask for help if you need support or instructions about stopping the use of drugs.  High blood pressure causes heart disease and increases the risk of stroke. Your blood pressure should be checked at least every 1 to 2 years. Ongoing high blood pressure should be treated with medicines if weight loss and exercise do not work.  If you are 71-69 years old, ask your health care provider if you should take aspirin to prevent strokes.  Diabetes screening is done by taking a blood sample to check your blood glucose level after you have not eaten for a certain period of time (fasting). If you are not overweight and you do not have risk factors for diabetes, you should be screened once every 3 years starting at age 53. If you are overweight or obese and you are 43-69 years of age, you should be screened for diabetes every year as part of your cardiovascular risk assessment.  Breast cancer screening is essential preventive care for women. You should practice "breast self-awareness." This means understanding the normal appearance and feel of your breasts and may include breast self-examination. Any changes detected, no matter how small, should be reported to a health care provider. Women in their 73s and 30s should have a clinical breast exam (CBE) by a health care provider as part of a regular health exam every 1 to 3 years. After age 26, women should have a CBE every year. Starting at age 40, women should consider having a mammogram (breast X-ray test) every year. Women who have a family history of breast cancer should talk to their health care provider about genetic screening. Women at a high risk of breast cancer should talk to their health care providers about having an MRI and a mammogram every year.  Breast cancer gene (BRCA)-related cancer risk assessment is recommended for women who have family members with BRCA-related  cancers. BRCA-related cancers include breast, ovarian, tubal, and peritoneal cancers. Having family members with these cancers may be associated with an increased risk for harmful changes (mutations) in the breast cancer genes BRCA1 and BRCA2. Results of the assessment will determine the need for genetic counseling and BRCA1 and BRCA2 testing.  Your health care provider may recommend that you be screened regularly for cancer of the pelvic organs (ovaries, uterus, and vagina). This screening involves a pelvic examination, including checking for microscopic changes to the surface of your cervix (Pap test). You may be encouraged to have this screening done every 3 years, beginning at age 25.  For women ages 81-65, health care providers may recommend pelvic exams and Pap testing every 3 years, or they may recommend the Pap and pelvic exam, combined with testing for human papilloma virus (HPV), every 5 years. Some types of HPV increase your risk of cervical cancer. Testing for HPV may also be done on women of any age with unclear Pap test results.  Other  health care providers may not recommend any screening for nonpregnant women who are considered low risk for pelvic cancer and who do not have symptoms. Ask your health care provider if a screening pelvic exam is right for you.  If you have had past treatment for cervical cancer or a condition that could lead to cancer, you need Pap tests and screening for cancer for at least 20 years after your treatment. If Pap tests have been discontinued, your risk factors (such as having a new sexual partner) need to be reassessed to determine if screening should resume. Some women have medical problems that increase the chance of getting cervical cancer. In these cases, your health care provider may recommend more frequent screening and Pap tests.  Colorectal cancer can be detected and often prevented. Most routine colorectal cancer screening begins at the age of 53 years  and continues through age 48 years. However, your health care provider may recommend screening at an earlier age if you have risk factors for colon cancer. On a yearly basis, your health care provider may provide home test kits to check for hidden blood in the stool. Use of a small camera at the end of a tube, to directly examine the colon (sigmoidoscopy or colonoscopy), can detect the earliest forms of colorectal cancer. Talk to your health care provider about this at age 68, when routine screening begins. Direct exam of the colon should be repeated every 5-10 years through age 3 years, unless early forms of precancerous polyps or small growths are found.  People who are at an increased risk for hepatitis B should be screened for this virus. You are considered at high risk for hepatitis B if:  You were born in a country where hepatitis B occurs often. Talk with your health care provider about which countries are considered high risk.  Your parents were born in a high-risk country and you have not received a shot to protect against hepatitis B (hepatitis B vaccine).  You have HIV or AIDS.  You use needles to inject street drugs.  You live with, or have sex with, someone who has hepatitis B.  You get hemodialysis treatment.  You take certain medicines for conditions like cancer, organ transplantation, and autoimmune conditions.  Hepatitis C blood testing is recommended for all people born from [birth year] through 1965 and any individual with known risks for hepatitis C.  Practice safe sex. Use condoms and avoid high-risk sexual practices to reduce the spread of sexually transmitted infections (STIs). STIs include gonorrhea, chlamydia, syphilis, trichomonas, herpes, HPV, and human immunodeficiency virus (HIV). Herpes, HIV, and HPV are viral illnesses that have no cure. They can result in disability, cancer, and death.  You should be screened for sexually transmitted illnesses (STIs) including  gonorrhea and chlamydia if:  You are sexually active and are younger than 24 years.  You are older than 24 years and your health care provider tells you that you are at risk for this type of infection.  Your sexual activity has changed since you were last screened and you are at an increased risk for chlamydia or gonorrhea. Ask your health care provider if you are at risk.  If you are at risk of being infected with HIV, it is recommended that you take a prescription medicine daily to prevent HIV infection. This is called preexposure prophylaxis (PrEP). You are considered at risk if:  You are sexually active and do not regularly use condoms or know the HIV status of your  partner(s).  You take drugs by injection.  You are sexually active with a partner who has HIV.  Talk with your health care provider about whether you are at high risk of being infected with HIV. If you choose to begin PrEP, you should first be tested for HIV. You should then be tested every 3 months for as long as you are taking PrEP.  Osteoporosis is a disease in which the bones lose minerals and strength with aging. This can result in serious bone fractures or breaks. The risk of osteoporosis can be identified using a bone density scan. Women ages 53 years and over and women at risk for fractures or osteoporosis should discuss screening with their health care providers. Ask your health care provider whether you should take a calcium supplement or vitamin D to reduce the rate of osteoporosis.  Menopause can be associated with physical symptoms and risks. Hormone replacement therapy is available to decrease symptoms and risks. You should talk to your health care provider about whether hormone replacement therapy is right for you.  Use sunscreen. Apply sunscreen liberally and repeatedly throughout the day. You should seek shade when your shadow is shorter than you. Protect yourself by wearing long sleeves, pants, a wide-brimmed  hat, and sunglasses year round, whenever you are outdoors.  Once a month, do a whole body skin exam, using a mirror to look at the skin on your back. Tell your health care provider of new moles, moles that have irregular borders, moles that are larger than a pencil eraser, or moles that have changed in shape or color.  Stay current with required vaccines (immunizations).  Influenza vaccine. All adults should be immunized every year.  Tetanus, diphtheria, and acellular pertussis (Td, Tdap) vaccine. Pregnant women should receive 1 dose of Tdap vaccine during each pregnancy. The dose should be obtained regardless of the length of time since the last dose. Immunization is preferred during the 27th-36th week of gestation. An adult who has not previously received Tdap or who does not know her vaccine status should receive 1 dose of Tdap. This initial dose should be followed by tetanus and diphtheria toxoids (Td) booster doses every 10 years. Adults with an unknown or incomplete history of completing a 3-dose immunization series with Td-containing vaccines should begin or complete a primary immunization series including a Tdap dose. Adults should receive a Td booster every 10 years.  Varicella vaccine. An adult without evidence of immunity to varicella should receive 2 doses or a second dose if she has previously received 1 dose. Pregnant females who do not have evidence of immunity should receive the first dose after pregnancy. This first dose should be obtained before leaving the health care facility. The second dose should be obtained 4-8 weeks after the first dose.  Human papillomavirus (HPV) vaccine. Females aged 13-26 years who have not received the vaccine previously should obtain the 3-dose series. The vaccine is not recommended for use in pregnant females. However, pregnancy testing is not needed before receiving a dose. If a female is found to be pregnant after receiving a dose, no treatment is  needed. In that case, the remaining doses should be delayed until after the pregnancy. Immunization is recommended for any person with an immunocompromised condition through the age of 51 years if she did not get any or all doses earlier. During the 3-dose series, the second dose should be obtained 4-8 weeks after the first dose. The third dose should be obtained 24 weeks after  the first dose and 16 weeks after the second dose.  Zoster vaccine. One dose is recommended for adults aged 70 years or older unless certain conditions are present.  Measles, mumps, and rubella (MMR) vaccine. Adults born before [birth year] generally are considered immune to measles and mumps. Adults born in [birth year] or later should have 1 or more doses of MMR vaccine unless there is a contraindication to the vaccine or there is laboratory evidence of immunity to each of the three diseases. A routine second dose of MMR vaccine should be obtained at least 28 days after the first dose for students attending postsecondary schools, health care workers, or international travelers. People who received inactivated measles vaccine or an unknown type of measles vaccine during 1963-1967 should receive 2 doses of MMR vaccine. People who received inactivated mumps vaccine or an unknown type of mumps vaccine before 1979 and are at high risk for mumps infection should consider immunization with 2 doses of MMR vaccine. For females of childbearing age, rubella immunity should be determined. If there is no evidence of immunity, females who are not pregnant should be vaccinated. If there is no evidence of immunity, females who are pregnant should delay immunization until after pregnancy. Unvaccinated health care workers born before [birth year] who lack laboratory evidence of measles, mumps, or rubella immunity or laboratory confirmation of disease should consider measles and mumps immunization with 2 doses of MMR vaccine or rubella immunization with 1 dose of MMR  vaccine.  Pneumococcal 13-valent conjugate (PCV13) vaccine. When indicated, a person who is uncertain of his immunization history and has no record of immunization should receive the PCV13 vaccine. All adults 59 years of age and older should receive this vaccine. An adult aged 15 years or older who has certain medical conditions and has not been previously immunized should receive 1 dose of PCV13 vaccine. This PCV13 should be followed with a dose of pneumococcal polysaccharide (PPSV23) vaccine. Adults who are at high risk for pneumococcal disease should obtain the PPSV23 vaccine at least 8 weeks after the dose of PCV13 vaccine. Adults older than 72 years of age who have normal immune system function should obtain the PPSV23 vaccine dose at least 1 year after the dose of PCV13 vaccine.  Pneumococcal polysaccharide (PPSV23) vaccine. When PCV13 is also indicated, PCV13 should be obtained first. All adults aged 4 years and older should be immunized. An adult younger than age 93 years who has certain medical conditions should be immunized. Any person who resides in a nursing home or long-term care facility should be immunized. An adult smoker should be immunized. People with an immunocompromised condition and certain other conditions should receive both PCV13 and PPSV23 vaccines. People with human immunodeficiency virus (HIV) infection should be immunized as soon as possible after diagnosis. Immunization during chemotherapy or radiation therapy should be avoided. Routine use of PPSV23 vaccine is not recommended for American Indians, [place] Natives, or people younger than 65 years unless there are medical conditions that require PPSV23 vaccine. When indicated, people who have unknown immunization and have no record of immunization should receive PPSV23 vaccine. One-time revaccination 5 years after the first dose of PPSV23 is recommended for people aged 19-64 years who have chronic kidney failure, nephrotic syndrome,  asplenia, or immunocompromised conditions. People who received 1-2 doses of PPSV23 before age 68 years should receive another dose of PPSV23 vaccine at age 10 years or later if at least 5 years have passed since the previous dose. Doses of PPSV23 are not  needed for people immunized with PPSV23 at or after age 71 years.  Meningococcal vaccine. Adults with asplenia or persistent complement component deficiencies should receive 2 doses of quadrivalent meningococcal conjugate (MenACWY-D) vaccine. The doses should be obtained at least 2 months apart. Microbiologists working with certain meningococcal bacteria, [place] recruits, people at risk during an outbreak, and people who travel to or live in countries with a high rate of meningitis should be immunized. A first-year college student up through age 86 years who is living in a residence hall should receive a dose if she did not receive a dose on or after her 16th birthday. Adults who have certain high-risk conditions should receive one or more doses of vaccine.  Hepatitis A vaccine. Adults who wish to be protected from this disease, have certain high-risk conditions, work with hepatitis A-infected animals, work in hepatitis A research labs, or travel to or work in countries with a high rate of hepatitis A should be immunized. Adults who were previously unvaccinated and who anticipate close contact with an international adoptee during the first 60 days after arrival in the [place] from a country with a high rate of hepatitis A should be immunized.  Hepatitis B vaccine. Adults who wish to be protected from this disease, have certain high-risk conditions, may be exposed to blood or other infectious body fluids, are household contacts or sex partners of hepatitis B positive people, are clients or workers in certain care facilities, or travel to or work in countries with a high rate of hepatitis B should be immunized.  Haemophilus influenzae type b (Hib)  vaccine. A previously unvaccinated person with asplenia or sickle cell disease or having a scheduled splenectomy should receive 1 dose of Hib vaccine. Regardless of previous immunization, a recipient of a hematopoietic stem cell transplant should receive a 3-dose series 6-12 months after her successful transplant. Hib vaccine is not recommended for adults with HIV infection. Preventive Services / Frequency Ages 18 to 5 years  Blood pressure check.** / Every 3-5 years.  Lipid and cholesterol check.** / Every 5 years beginning at age 67.  Clinical breast exam.** / Every 3 years for women in their 63s and 68s.  BRCA-related cancer risk assessment.** / For women who have family members with a BRCA-related cancer (breast, ovarian, tubal, or peritoneal cancers).  Pap test.** / Every 2 years from ages 35 through 25. Every 3 years starting at age 61 through age 42 or 62 with a history of 3 consecutive normal Pap tests.  HPV screening.** / Every 3 years from ages 14 through ages 21 to 7 with a history of 3 consecutive normal Pap tests.  Hepatitis C blood test.** / For any individual with known risks for hepatitis C.  Skin self-exam. / Monthly.  Influenza vaccine. / Every year.  Tetanus, diphtheria, and acellular pertussis (Tdap, Td) vaccine.** / Consult your health care provider. Pregnant women should receive 1 dose of Tdap vaccine during each pregnancy. 1 dose of Td every 10 years.  Varicella vaccine.** / Consult your health care provider. Pregnant females who do not have evidence of immunity should receive the first dose after pregnancy.  HPV vaccine. / 3 doses over 6 months, if 82 and younger. The vaccine is not recommended for use in pregnant females. However, pregnancy testing is not needed before receiving a dose.  Measles, mumps, rubella (MMR) vaccine.** / You need at least 1 dose of MMR if you were born in 1957 or later. You may also need  a 2nd dose. For females of childbearing age,  rubella immunity should be determined. If there is no evidence of immunity, females who are not pregnant should be vaccinated. If there is no evidence of immunity, females who are pregnant should delay immunization until after pregnancy.  Pneumococcal 13-valent conjugate (PCV13) vaccine.** / Consult your health care provider.  Pneumococcal polysaccharide (PPSV23) vaccine.** / 1 to 2 doses if you smoke cigarettes or if you have certain conditions.  Meningococcal vaccine.** / 1 dose if you are age 14 to 37 years and a Market researcher living in a residence hall, or have one of several medical conditions, you need to get vaccinated against meningococcal disease. You may also need additional booster doses.  Hepatitis A vaccine.** / Consult your health care provider.  Hepatitis B vaccine.** / Consult your health care provider.  Haemophilus influenzae type b (Hib) vaccine.** / Consult your health care provider. Ages 89 to 25 years  Blood pressure check.** / Every year.  Lipid and cholesterol check.** / Every 5 years beginning at age 68 years.  Lung cancer screening. / Every year if you are aged 23-80 years and have a 30-pack-year history of smoking and currently smoke or have quit within the past 15 years. Yearly screening is stopped once you have quit smoking for at least 15 years or develop a health problem that would prevent you from having lung cancer treatment.  Clinical breast exam.** / Every year after age 44 years.  BRCA-related cancer risk assessment.** / For women who have family members with a BRCA-related cancer (breast, ovarian, tubal, or peritoneal cancers).  Mammogram.** / Every year beginning at age 52 years and continuing for as long as you are in good health. Consult with your health care provider.  Pap test.** / Every 3 years starting at age 16 years through age 70 or 2 years with a history of 3 consecutive normal Pap tests.  HPV screening.** / Every 3 years from  ages 19 years through ages 40 to 40 years with a history of 3 consecutive normal Pap tests.  Fecal occult blood test (FOBT) of stool. / Every year beginning at age 65 years and continuing until age 88 years. You may not need to do this test if you get a colonoscopy every 10 years.  Flexible sigmoidoscopy or colonoscopy.** / Every 5 years for a flexible sigmoidoscopy or every 10 years for a colonoscopy beginning at age 59 years and continuing until age 46 years.  Hepatitis C blood test.** / For all people born from [birth year] through 1967 and any individual with known risks for hepatitis C.  Skin self-exam. / Monthly.  Influenza vaccine. / Every year.  Tetanus, diphtheria, and acellular pertussis (Tdap/Td) vaccine.** / Consult your health care provider. Pregnant women should receive 1 dose of Tdap vaccine during each pregnancy. 1 dose of Td every 10 years.  Varicella vaccine.** / Consult your health care provider. Pregnant females who do not have evidence of immunity should receive the first dose after pregnancy.  Zoster vaccine.** / 1 dose for adults aged 34 years or older.  Measles, mumps, rubella (MMR) vaccine.** / You need at least 1 dose of MMR if you were born in 1957 or later. You may also need a second dose. For females of childbearing age, rubella immunity should be determined. If there is no evidence of immunity, females who are not pregnant should be vaccinated. If there is no evidence of immunity, females who are pregnant should delay immunization until  after pregnancy.  Pneumococcal 13-valent conjugate (PCV13) vaccine.** / Consult your health care provider.  Pneumococcal polysaccharide (PPSV23) vaccine.** / 1 to 2 doses if you smoke cigarettes or if you have certain conditions.  Meningococcal vaccine.** / Consult your health care provider.  Hepatitis A vaccine.** / Consult your health care provider.  Hepatitis B vaccine.** / Consult your health care provider.  Haemophilus  influenzae type b (Hib) vaccine.** / Consult your health care provider. Ages 4 years and over  Blood pressure check.** / Every year.  Lipid and cholesterol check.** / Every 5 years beginning at age 31 years.  Lung cancer screening. / Every year if you are aged 4-80 years and have a 30-pack-year history of smoking and currently smoke or have quit within the past 15 years. Yearly screening is stopped once you have quit smoking for at least 15 years or develop a health problem that would prevent you from having lung cancer treatment.  Clinical breast exam.** / Every year after age 15 years.  BRCA-related cancer risk assessment.** / For women who have family members with a BRCA-related cancer (breast, ovarian, tubal, or peritoneal cancers).  Mammogram.** / Every year beginning at age 41 years and continuing for as long as you are in good health. Consult with your health care provider.  Pap test.** / Every 3 years starting at age 4 years through age 50 or 30 years with 3 consecutive normal Pap tests. Testing can be stopped between 65 and 70 years with 3 consecutive normal Pap tests and no abnormal Pap or HPV tests in the past 10 years.  HPV screening.** / Every 3 years from ages 65 years through ages 78 or 26 years with a history of 3 consecutive normal Pap tests. Testing can be stopped between 65 and 70 years with 3 consecutive normal Pap tests and no abnormal Pap or HPV tests in the past 10 years.  Fecal occult blood test (FOBT) of stool. / Every year beginning at age 37 years and continuing until age 85 years. You may not need to do this test if you get a colonoscopy every 10 years.  Flexible sigmoidoscopy or colonoscopy.** / Every 5 years for a flexible sigmoidoscopy or every 10 years for a colonoscopy beginning at age 61 years and continuing until age 20 years.  Hepatitis C blood test.** / For all people born from [birth year] through 1967 and any individual with known risks for hepatitis  C.  Osteoporosis screening.** / A one-time screening for women ages 100 years and over and women at risk for fractures or osteoporosis.  Skin self-exam. / Monthly.  Influenza vaccine. / Every year.  Tetanus, diphtheria, and acellular pertussis (Tdap/Td) vaccine.** / 1 dose of Td every 10 years.  Varicella vaccine.** / Consult your health care provider.  Zoster vaccine.** / 1 dose for adults aged 91 years or older.  Pneumococcal 13-valent conjugate (PCV13) vaccine.** / Consult your health care provider.  Pneumococcal polysaccharide (PPSV23) vaccine.** / 1 dose for all adults aged 6 years and older.  Meningococcal vaccine.** / Consult your health care provider.  Hepatitis A vaccine.** / Consult your health care provider.  Hepatitis B vaccine.** / Consult your health care provider.  Haemophilus influenzae type b (Hib) vaccine.** / Consult your health care provider. ** Family history and personal history of risk and conditions may change your health care provider's recommendations.   This information is not intended to replace advice given to you by your health care provider. Make sure you discuss any  questions you have with your health care provider.   Document Released: 01/25/2002 Document Revised: 12/20/2014 Document Reviewed: 04/26/2011 Elsevier Interactive Patient Education [employer].

## 2016-02-26 NOTE — Assessment & Plan Note (Signed)
Encouraged heart healthy diet, increase exercise, avoid trans fats, consider a krill oil cap daily 

## 2016-02-26 NOTE — Assessment & Plan Note (Signed)
Patient encouraged to maintain heart healthy diet, regular exercise, adequate sleep. Consider daily probiotics. Take medications as prescribed. Immunization Status: Flu vaccine-- 10/02/15 Tdap-- 10/02/13 PNA-- 11/01/14 Shingles-- 12/09/08  A/P:  Changes to [place], [place] or Personal Hx: UTD Pap-- 07/22/15 w/ Dr. Marylynn Pearson at [facility]; negative MMG-- 08/04/15 w/ [facility]; bi-rads category 0-incomplete; need additional imaging evaluation Bone Density-- 08/08/15 w/ [facility]; L1-L2, L4 (T-score 3.7); normal; Right Neck (T-score 1.2); normal & Left Neck (T-score 0.6); normal CCS-- 12/20/13 w/ Dr. 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  Care Teams Updated: Dr. Marylynn Pearson - Gynecology  Dr. Reynold Bowen - Endocrinology  ED/Hospital/Urgent Care Visits: Per the patient, no recent visits to the ED/Hospital or Urgent Care.

## 2016-02-26 NOTE — Assessment & Plan Note (Signed)
responded partially to Zpak but now still present. Encouraged increased rest and hydration, add probiotics, zinc such as Coldeze or Xicam. Treat fevers as needed, elderberry, zinc, garlic, vitamin C. If not improved then is given Augmentin 875 mg bid to use

## 2016-02-26 NOTE — Progress Notes (Signed)
Pre visit review using our clinic review tool, if applicable. No additional management support is needed unless otherwise documented below in the visit note. 

## 2016-02-26 NOTE — Assessment & Plan Note (Signed)
Uses CPAP daily except when ill

## 2016-02-26 NOTE — Assessment & Plan Note (Addendum)
Poorly controlled but is ill no changes to meds today. Encouraged heart healthy diet such as the DASH diet and exercise as tolerated. She notes systolic BP of [id number] to [id number].

## 2016-02-26 NOTE — Progress Notes (Signed)
Subjective:    Patient ID: Jackie Stevens, female    DOB: 1946/11/26, 70 y.o.   MRN: [id number]  Chief Complaint  Patient presents with  . Annual Exam    HPI Patient is in today for Annual Physical Exam.  Patient having some pain with her sacrum almost as if it is bruised, but no bruise, patient reports she is having some kind of tingling in ear, and during night she has trouble breathing and CPAP gets in way so she takes off during night.  No recent falls or trauma. Continues to struggle with uri symptoms. Notes head congestion, PND, cough, malaise, myalgias and ear pain. Denies CP/palp/SOB/HA/GI or GU c/o. Taking meds as prescribed    Past Medical History  Diagnosis Date  . Allergic rhinitis   . GERD (gastroesophageal reflux disease)   . Hyperlipidemia   . Chicken pox as a child  . Measles as a child  . Insomnia   . Thyroid disease   . Constipation   . Diverticulitis 10/25/2012    pt. reports that a drain was placed - 09/2012    . Abnormal cervical cytology 10/25/2012    Follows with Dr Leavy Cella of Gyn  . Asthma   . Heart murmur   . Complication of anesthesia   . PONV (postoperative nausea and vomiting)   . HTN (hypertension)     stress test completed by Anselm Lis, diagnosed as GERD  . Thyroid cancer ([place]) 1980's  . Hypothyroidism   . H/O hiatal hernia   . External hemorrhoid, bleeding     "sometimes" (January 02, 2013)  . Arthritis     "knees; right thumb; shoulders" (2013-01-02)  . Kidney stones 1970's    "passed on their own" (Jan 02, 2013)  . OSA on CPAP   . UTI (urinary tract infection) 04/02/2013  . Anemia 10/06/2013  . Preventative health care 10/06/2013    [facility] Pneumovax in 2012  . Low back pain 06/09/2014  . Medicare annual wellness visit, subsequent 10/06/2013    [facility] Pneumovax in 2012   . Paresthesia 03/23/2015    Left face  . Anginal pain ([place])     pt has history of CP states had cardiac workup with no specific  issues identified   . Shortness of breath dyspnea     using stairs  . Anxiety     when increased stress   . Incontinence   . Preventative health care 02/26/2016    Past Surgical History  Procedure Laterality Date  . Thyroidectomy, partial  1988    "then did iodine to remove the rest" (01/02/2013)  . Sigmoid resection / rectopexy  01/02/2013  . Colon surgery    . Tonsillectomy  1951?  Marland Kitchen Cholecystectomy  1990  . Vaginal hysterectomy  1970's    "still have my ovaries" (January 02, 2013)  . Dilation and curettage of uterus  1960's    "lots of them; had miscarriages" (2013/01/02)  . Transrectal drainage of pelvic abscess  10/27/2012  . Colostomy revision  01-02-2013    Procedure: COLON RESECTION SIGMOID;  Surgeon: Gwenyth Ober, MD;  Location: [facility];  Service: General;  Laterality: N/A;  . Cystoscopy with stent placement  01-02-2013    Procedure: CYSTOSCOPY WITH STENT PLACEMENT;  Surgeon: Hanley Ben, MD;  Location: [facility];  Service: Urology;  Laterality: N/A;  . Lysis of adhesion N/A 12/02/2015    Procedure: LAPAROSCOPIC LYSIS OF ADHESION;  Surgeon: Johnathan Hausen, MD;  Location: [facility];  Service:  General;  Laterality: N/A;  . Robotic assisted bilateral salpingo oopherectomy Bilateral 12/02/2015    Procedure: XI ROBOTIC ASSISTED BILATERAL SALPINGO OOPHORECTOMY;  Surgeon: Everitt Amber, MD;  Location: [facility];  Service: Gynecology;  Laterality: Bilateral;    Family History  Problem Relation Age of Onset  . Heart disease Father   . Pneumonia Father   . Hypertension Father   . Hyperlipidemia Father   . Cancer Father     skin  . Stroke Father   . Cancer Paternal Aunt   . Alzheimer's disease Mother   . Heart disease Mother   . Emphysema Brother     marijuana and cigarettes  . Alcohol abuse Brother   . Hearing loss Brother   . Diabetes Maternal Grandmother   . Alzheimer's disease Paternal Grandmother   . Cancer Paternal Grandmother     lung?- smoker  . Hyperlipidemia Paternal Grandmother   .  Heart attack Paternal Grandfather   . Alcohol abuse Paternal Grandfather   . Neurofibromatosis Son     schwanomatosis  . Neurofibromatosis Son     swanomatosis    Social History   Social History  . Marital Status: Married    Spouse Name: N/A  . Number of Children: N/A  . Years of Education: N/A   Occupational History  . Not on file.   Social History Main Topics  . Smoking status: Never Smoker   . Smokeless tobacco: Never Used  . Alcohol Use: 0.0 oz/week    0 Standard drinks or equivalent per week     Comment: 12/19/2012 "glass of wine q night q other month or so" beer with pizza   . Drug Use: No  . Sexual Activity: No   Other Topics Concern  . Not on file   Social History Narrative   Married    children    Outpatient Prescriptions Prior to Visit  Medication Sig Dispense Refill  . aspirin EC 81 MG tablet Take 81 mg by mouth daily. Reported on 12/19/2015    . cetirizine (ZYRTEC) 10 MG tablet Take 1 tablet (10 mg total) by mouth daily. (Patient taking differently: Take 10 mg by mouth daily as needed for allergies. ) 30 tablet 11  . docusate sodium (COLACE) 100 MG capsule Take 100 mg by mouth 2 (two) times daily.    . fluticasone (FLONASE) 50 MCG/ACT nasal spray Place 2 sprays into both nostrils daily. (Patient taking differently: Place 2 sprays into both nostrils daily as needed for allergies. ) 16 g 2  . hydrochlorothiazide (HYDRODIURIL) 25 MG tablet Take 1 tablet (25 mg total) by mouth daily. 90 tablet 2  . KRILL OIL PO Take 750 mg by mouth daily.     Marland Kitchen levothyroxine (SYNTHROID, LEVOTHROID) 150 MCG tablet Take 150-225 mcg by mouth daily before breakfast. 1 tablet (150 mcg) every day except Sunday, 1.5 tablet (225 mcg) on Sunday    . naproxen (NAPROSYN) 375 MG tablet TAKE 1 TABLET BY MOUTH TWICE A DAY WITH A MEAL 180 tablet 0  . omeprazole (PRILOSEC) 20 MG capsule TAKE 1 CAPSULE EVERY MORNING AT BREAFAST 90 capsule 2  . Polyethyl Glycol-Propyl Glycol 0.4-0.3 % GEL Apply 1  drop to eye 2 (two) times daily as needed (dry eyes).     . polyethylene glycol (MIRALAX / GLYCOLAX) packet Take 17 g by mouth daily.    . Probiotic Product (PROBIOTIC ADVANCED PO) Take 2 tablets by mouth at bedtime. Digestive Advantage    . psyllium (REGULOID) 0.52 G  capsule Take 0.52 g by mouth at bedtime.    . ranitidine (ZANTAC) 150 MG tablet TAKE 1 TABLET BY MOUTH AT BEDTIME 90 tablet 2  . simvastatin (ZOCOR) 40 MG tablet TAKE 1 TABLET BY MOUTH AT BEDTIME 90 tablet 2  . sodium chloride (OCEAN) 0.65 % SOLN nasal spray Place 1 spray into both nostrils 2 (two) times daily as needed for congestion.     . traMADol (ULTRAM) 50 MG tablet TAKE 1 TABLET BY MOUTH EVERY 6 HOURS AS NEEDED FOR MODERATE PAIN 30 tablet 0  . TURMERIC PO Take 1 tablet by mouth daily. Reported on 12/19/2015    . valsartan (DIOVAN) 320 MG tablet Take 1 tablet (320 mg total) by mouth daily. 90 tablet 2  . vitamin B-12 (CYANOCOBALAMIN) 1000 MCG tablet Take 1,000 mcg by mouth daily.     . Vitamin D, Cholecalciferol, 1000 UNITS CAPS Take 1 capsule by mouth daily.    Marland Kitchen albuterol (PROVENTIL HFA;VENTOLIN HFA) 108 (90 BASE) MCG/ACT inhaler Inhale 2 puffs into the lungs every 6 (six) hours as needed for wheezing. (Patient not taking: Reported on 02/25/2016) 1 Inhaler 2  . Linaclotide (LINZESS) 145 MCG CAPS capsule Take 145 mcg by mouth once a week. Reported on 02/26/2016    . NITROSTAT 0.4 MG SL tablet PLACE 1 TABLET UNDER THE TONGUE EVERY 5 MINUTES AS NEEDED FOR CHEST PAIN (Patient not taking: Reported on 02/26/2016) 25 tablet 0  . naproxen (NAPROSYN) 375 MG tablet Take 375 mg by mouth 2 (two) times daily with a meal.     No facility-administered medications prior to visit.    Allergies  Allergen Reactions  . Neomycin-Bacitracin Zn-Polymyx Rash    Polysporin- is tolerated   . Niacin Other (See Comments) and Cough    "cough til I threw up" (12/19/2012)  . Ciprofloxacin Hives    Got cipro and flagyl at same time, localized hives to IV  arm  . Flagyl [Metronidazole] Hives    Got cipro and flagyl at same time, localized hives to IV arm    Review of Systems  Constitutional: Positive for malaise/fatigue. Negative for fever.  HENT: Positive for congestion.        Some dullness in ears   Eyes: Negative for blurred vision.  Respiratory: Positive for cough and sputum production. Negative for shortness of breath.   Cardiovascular: Negative for chest pain, palpitations and leg swelling.  Gastrointestinal: Negative for nausea, abdominal pain and blood in stool.  Genitourinary: Negative for dysuria and frequency.  Musculoskeletal: Positive for back pain. Negative for falls.  Skin: Negative for rash.  Neurological: Negative for dizziness, loss of consciousness and headaches.  Endo/Heme/Allergies: Negative for environmental allergies.  Psychiatric/Behavioral: Negative for depression. The patient is not nervous/anxious.        Objective:    Physical Exam  Constitutional: She is oriented to person, place, and time. She appears well-developed and well-nourished. No distress.  HENT:  Head: Normocephalic and atraumatic.  Eyes: Conjunctivae are normal.  Neck: Neck supple. No thyromegaly present.  Cardiovascular: Normal rate and regular rhythm.   Murmur heard. Pulmonary/Chest: Effort normal and breath sounds normal. No respiratory distress.  Abdominal: Soft. Bowel sounds are normal. She exhibits no distension and no mass. There is no tenderness.  Musculoskeletal: She exhibits no edema.  Lymphadenopathy:    She has no cervical adenopathy.  Neurological: She is alert and oriented to person, place, and time.  Skin: Skin is warm and dry.  Psychiatric: She has a normal mood  and affect. Her behavior is normal.    BP 153/71 mmHg  Pulse 79  Temp(Src) 98 F (36.7 C) (Oral)  Ht 5\' 2"  (1.575 m)  Wt 200 lb 4 oz (90.833 kg)  BMI 36.62 kg/m2  SpO2 93% Wt Readings from Last 3 Encounters:  02/26/16 200 lb 4 oz (90.833 kg)  02/16/16  202 lb 6.4 oz (91.808 kg)  12/19/15 201 lb 4.8 oz (91.309 kg)     Lab Results  Component Value Date   WBC 6.4 02/23/2016   HGB 12.3 02/23/2016   HCT 37.2 02/23/2016   PLT 307.0 02/23/2016   GLUCOSE 99 02/23/2016   CHOL 123 02/23/2016   TRIG 116.0 02/23/2016   HDL 38.40* 02/23/2016   LDLCALC 62 02/23/2016   ALT 12 02/23/2016   AST 16 02/23/2016   NA 132* 02/23/2016   K 3.9 02/23/2016   CL 94* 02/23/2016   CREATININE 0.70 02/23/2016   BUN 13 02/23/2016   CO2 30 02/23/2016   TSH 0.31* 02/23/2016   INR 0.99 03/16/2015   HGBA1C 5.8* 07/18/2011    Lab Results  Component Value Date   TSH 0.31* 02/23/2016   Lab Results  Component Value Date   WBC 6.4 02/23/2016   HGB 12.3 02/23/2016   HCT 37.2 02/23/2016   MCV 81.6 02/23/2016   PLT 307.0 02/23/2016   Lab Results  Component Value Date   NA 132* 02/23/2016   K 3.9 02/23/2016   CO2 30 02/23/2016   GLUCOSE 99 02/23/2016   BUN 13 02/23/2016   CREATININE 0.70 02/23/2016   BILITOT 0.3 02/23/2016   ALKPHOS 81 02/23/2016   AST 16 02/23/2016   ALT 12 02/23/2016   PROT 6.8 02/23/2016   ALBUMIN 4.1 02/23/2016   CALCIUM 8.9 02/23/2016   ANIONGAP 6 12/03/2015   GFR 87.54 02/23/2016   Lab Results  Component Value Date   CHOL 123 02/23/2016   Lab Results  Component Value Date   HDL 38.40* 02/23/2016   Lab Results  Component Value Date   LDLCALC 62 02/23/2016   Lab Results  Component Value Date   TRIG 116.0 02/23/2016   Lab Results  Component Value Date   CHOLHDL 3 02/23/2016   Lab Results  Component Value Date   HGBA1C 5.8* 07/18/2011       Assessment & Plan:   Problem List Items Addressed This Visit    Constipation    Colace bid, Miralax daily, probiotics, Linzess weekly      Relevant Orders   Vitamin B12   Lipid panel   TSH   Comprehensive metabolic panel   CBC   Cough    responded partially to Zpak but now still present. Encouraged increased rest and hydration, add probiotics, zinc such as  Coldeze or Xicam. Treat fevers as needed, elderberry, zinc, garlic, vitamin C. If not improved then is given Augmentin 875 mg bid to use      Essential hypertension - Primary    Poorly controlled but is ill no changes to meds today. Encouraged heart healthy diet such as the DASH diet and exercise as tolerated. She notes systolic BP of [id number] to 123456.       Relevant Orders   Vitamin B12   Lipid panel   TSH   Comprehensive metabolic panel   CBC   G E R D    Avoid offending foods, take  probiotics. Do not eat large meals in late evening and consider raising head of bed.  Relevant Orders   Vitamin B12   Lipid panel   TSH   Comprehensive metabolic panel   CBC   Hyperlipidemia, mixed    Encouraged heart healthy diet, increase exercise, avoid trans fats, consider a krill oil cap daily      Obstructive sleep apnea    Uses CPAP daily except when ill      Relevant Orders   Vitamin B12   Lipid panel   TSH   Comprehensive metabolic panel   CBC   Preventative health care    Patient encouraged to maintain heart healthy diet, regular exercise, adequate sleep. Consider daily probiotics. Take medications as prescribed. Immunization Status: Flu vaccine-- 10/02/15 Tdap-- 10/02/13 PNA-- 11/01/14 Shingles-- 12/09/08  A/P:  Changes to [place], [place] or Personal Hx: UTD Pap-- 07/22/15 w/ Dr. Marylynn Pearson at [facility]; negative MMG-- 08/04/15 w/ [facility]; bi-rads category 0-incomplete; need additional imaging evaluation Bone Density-- 08/08/15 w/ [facility]; L1-L2, L4 (T-score 3.7); normal; Right Neck (T-score 1.2); normal & Left Neck (T-score 0.6); normal CCS-- 12/20/13 w/ Dr. Carol Ada at [facility]; benign-appearing, intrinsic moderate stenosis (7 cm from the anal verge) found in the recto-sigmoid colon; medium-sized external and internal hemorrhoids.  Care Teams Updated: Dr. Marylynn Pearson - Gynecology  Dr. Reynold Bowen - Endocrinology  ED/Hospital/Urgent Care Visits: Per the patient, no recent visits to the ED/Hospital or Urgent Care.      Radicular pain of sacrum    No trauma or fall, sacral xray cannot rule out fracture will proceed with MRI if patient agrees. Encouraged topical treatments for now.       Relevant Orders   DG Sacrum/Coccyx (Completed)   Vitamin B12   Lipid panel   TSH   Comprehensive metabolic panel   CBC   Thyroid disease    On Levothyroxine, continue to monitor. TSH 0.31 on Synthroid 150 daily asymptomatic no changes, continues to follow with endocrinology      Relevant Orders   Vitamin B12   Lipid panel   TSH   Comprehensive metabolic panel   CBC   Vitamin B12 deficiency   Relevant Orders   Vitamin B12   Lipid panel   TSH   Comprehensive metabolic panel   CBC    Other Visit Diagnoses    Need for hepatitis C screening test        Relevant Orders    Hepatitis C antibody       Pre-Visit Info 02/25/2016 1:43 PM  Medication: Reviewed & UTD with the patient.  Preferred Pharmacy and which med where: [id number] - [place], [place] - [address]  Allergies verified: UTD  Immunization Status: Flu vaccine-- 10/02/15 Tdap-- 10/02/13 PNA-- 11/01/14 Shingles-- 12/09/08  A/P:  Changes to [place], [place] or Personal Hx: UTD Pap-- 07/22/15 w/ Dr. Marylynn Pearson at [facility]; negative MMG-- 08/04/15 w/ [facility]; bi-rads category 0-incomplete; need additional imaging evaluation Bone Density-- 08/08/15 w/ [facility]; L1-L2, L4 (T-score 3.7); normal; Right Neck (T-score 1.2); normal & Left Neck (T-score 0.6); normal CCS-- 12/20/13 w/ Dr. 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  Care Teams Updated: Dr. Marylynn Pearson -  Gynecology  Dr. Reynold Bowen - Endocrinology  ED/Hospital/Urgent Care Visits: Per the patient, no recent visits to the  ED/Hospital or Urgent Care.  To Discuss with Provider: No concerns at the time of call.  Family Comments      I am having Ms. Laborde start on amoxicillin-clavulanate. I am also having her maintain her levothyroxine, psyllium, albuterol, cetirizine, fluticasone, KRILL OIL PO, TURMERIC PO, vitamin B-12, Polyethyl Glycol-Propyl Glycol, sodium chloride, Probiotic Product (PROBIOTIC ADVANCED PO), Vitamin D (Cholecalciferol), docusate sodium, NITROSTAT, ranitidine, aspirin EC, polyethylene glycol, valsartan, hydrochlorothiazide, traMADol, omeprazole, simvastatin, Linaclotide, and naproxen.  Meds ordered this encounter  Medications  . amoxicillin-clavulanate (AUGMENTIN) 875-125 MG tablet    Sig: Take 1 tablet by mouth 2 (two) times daily.    Dispense:  20 tablet    Refill:  0     Penni Homans, MD

## 2016-02-27 ENCOUNTER — Other Ambulatory Visit: Payer: Self-pay | Admitting: Family Medicine

## 2016-02-27 DIAGNOSIS — M533 Sacrococcygeal disorders, not elsewhere classified: Secondary | ICD-10-CM

## 2016-03-06 ENCOUNTER — Ambulatory Visit (HOSPITAL_BASED_OUTPATIENT_CLINIC_OR_DEPARTMENT_OTHER)
Admission: RE | Admit: 2016-03-06 | Discharge: 2016-03-06 | Disposition: A | Discharge status: Home / Self Care | Payer: PPO | Source: Ambulatory Visit | Attending: Family Medicine | Admitting: Family Medicine

## 2016-03-06 DIAGNOSIS — M47896 Other spondylosis, lumbar region: Secondary | ICD-10-CM | POA: Diagnosis not present

## 2016-03-06 DIAGNOSIS — R1030 Lower abdominal pain, unspecified: Secondary | ICD-10-CM | POA: Diagnosis not present

## 2016-03-06 DIAGNOSIS — M533 Sacrococcygeal disorders, not elsewhere classified: Secondary | ICD-10-CM | POA: Diagnosis not present

## 2016-03-12 ENCOUNTER — Other Ambulatory Visit: Payer: Self-pay | Admitting: Family Medicine

## 2016-03-12 NOTE — Telephone Encounter (Signed)
Phoned in refill to [facility] in [place]

## 2016-03-12 NOTE — Telephone Encounter (Signed)
Ok to refill tramadol 

## 2016-03-12 NOTE — Telephone Encounter (Signed)
Last refill 01/18/2016 and last office visit 02/27/2016

## 2016-03-17 DIAGNOSIS — M47812 Spondylosis without myelopathy or radiculopathy, cervical region: Secondary | ICD-10-CM | POA: Diagnosis not present

## 2016-03-17 DIAGNOSIS — M9901 Segmental and somatic dysfunction of cervical region: Secondary | ICD-10-CM | POA: Diagnosis not present

## 2016-03-22 DIAGNOSIS — M47812 Spondylosis without myelopathy or radiculopathy, cervical region: Secondary | ICD-10-CM | POA: Diagnosis not present

## 2016-03-22 DIAGNOSIS — M9901 Segmental and somatic dysfunction of cervical region: Secondary | ICD-10-CM | POA: Diagnosis not present

## 2016-03-25 DIAGNOSIS — M47812 Spondylosis without myelopathy or radiculopathy, cervical region: Secondary | ICD-10-CM | POA: Diagnosis not present

## 2016-03-25 DIAGNOSIS — M9901 Segmental and somatic dysfunction of cervical region: Secondary | ICD-10-CM | POA: Diagnosis not present

## 2016-03-29 DIAGNOSIS — M47812 Spondylosis without myelopathy or radiculopathy, cervical region: Secondary | ICD-10-CM | POA: Diagnosis not present

## 2016-03-29 DIAGNOSIS — M9901 Segmental and somatic dysfunction of cervical region: Secondary | ICD-10-CM | POA: Diagnosis not present

## 2016-03-30 ENCOUNTER — Ambulatory Visit (INDEPENDENT_AMBULATORY_CARE_PROVIDER_SITE_OTHER): Payer: PPO | Admitting: Physician Assistant

## 2016-03-30 ENCOUNTER — Encounter: Payer: Self-pay | Admitting: Physician Assistant

## 2016-03-30 ENCOUNTER — Ambulatory Visit (INDEPENDENT_AMBULATORY_CARE_PROVIDER_SITE_OTHER): Payer: PPO | Admitting: *Deleted

## 2016-03-30 VITALS — BP 140/65 | HR 70 | Temp 98.0°F | Resp 14 | Wt 201.8 lb

## 2016-03-30 DIAGNOSIS — Z23 Encounter for immunization: Secondary | ICD-10-CM | POA: Diagnosis not present

## 2016-03-30 DIAGNOSIS — K219 Gastro-esophageal reflux disease without esophagitis: Secondary | ICD-10-CM

## 2016-03-30 MED ORDER — PANTOPRAZOLE SODIUM 20 MG PO TBEC: 20.0000 mg | DELAYED_RELEASE_TABLET | Freq: Every day | ORAL | Status: DC

## 2016-03-30 NOTE — Progress Notes (Signed)
Patient presents to clinic today c/o 3 weeks of a dry cough s/p an acute bronchitis and influenza that were successfully treated. Denies chest congestion. Endorses dry nagging cough worse at night and after meals. Has history of asthma and GERD but denies any worsening heart burn. Is currently on a regimen of Prilosec and Ranitidine. Denies fever, chills, malaise or fatigue. Denies wheezing, chest tightness or SOB.   Past Medical History  Diagnosis Date  . Allergic rhinitis   . GERD (gastroesophageal reflux disease)   . Hyperlipidemia   . Chicken pox as a child  . Measles as a child  . Insomnia   . Thyroid disease   . Constipation   . Diverticulitis 10/25/2012    pt. reports that a drain was placed - 09/2012    . Abnormal cervical cytology 10/25/2012    Follows with Dr Leavy Cella of Gyn  . Asthma   . Heart murmur   . Complication of anesthesia   . PONV (postoperative nausea and vomiting)   . HTN (hypertension)     stress test completed by Anselm Lis, diagnosed as GERD  . Thyroid cancer ([place]) 1980's  . Hypothyroidism   . H/O hiatal hernia   . External hemorrhoid, bleeding     "sometimes" (2013-01-10)  . Arthritis     "knees; right thumb; shoulders" (2013-01-10)  . Kidney stones 1970's    "passed on their own" (Jan 10, 2013)  . OSA on CPAP   . UTI (urinary tract infection) 04/02/2013  . Anemia 10/06/2013  . Preventative health care 10/06/2013    [facility] Pneumovax in 2012  . Low back pain 06/09/2014  . Medicare annual wellness visit, subsequent 10/06/2013    [facility] Pneumovax in 2012   . Paresthesia 03/23/2015    Left face  . Anginal pain ([place])     pt has history of CP states had cardiac workup with no specific issues identified   . Shortness of breath dyspnea     using stairs  . Anxiety     when increased stress   . Incontinence   . Preventative health care 02/26/2016    Current Outpatient Prescriptions on File Prior to Visit    Medication Sig Dispense Refill  . aspirin EC 81 MG tablet Take 81 mg by mouth daily. Reported on 12/19/2015    . cetirizine (ZYRTEC) 10 MG tablet Take 1 tablet (10 mg total) by mouth daily. (Patient taking differently: Take 10 mg by mouth daily as needed for allergies. ) 30 tablet 11  . docusate sodium (COLACE) 100 MG capsule Take 100 mg by mouth 2 (two) times daily. Reported on 03/30/2016    . fluticasone (FLONASE) 50 MCG/ACT nasal spray Place 2 sprays into both nostrils daily. (Patient taking differently: Place 2 sprays into both nostrils daily as needed for allergies. ) 16 g 2  . hydrochlorothiazide (HYDRODIURIL) 25 MG tablet Take 1 tablet (25 mg total) by mouth daily. 90 tablet 2  . KRILL OIL PO Take 750 mg by mouth daily.     Marland Kitchen levothyroxine (SYNTHROID, LEVOTHROID) 150 MCG tablet Take 150-225 mcg by mouth daily before breakfast. 1 tablet (150 mcg) every day except Sunday, 1.5 tablet (225 mcg) on Sunday    . naproxen (NAPROSYN) 375 MG tablet TAKE 1 TABLET BY MOUTH TWICE A DAY WITH A MEAL 180 tablet 0  . Polyethyl Glycol-Propyl Glycol 0.4-0.3 % GEL Apply 1 drop to eye 2 (two) times daily as needed (dry eyes).     Marland Kitchen  polyethylene glycol (MIRALAX / GLYCOLAX) packet Take 17 g by mouth daily.    . Probiotic Product (PROBIOTIC ADVANCED PO) Take 2 tablets by mouth at bedtime. Digestive Advantage    . psyllium (REGULOID) 0.52 G capsule Take 0.52 g by mouth at bedtime.    . ranitidine (ZANTAC) 150 MG tablet TAKE 1 TABLET BY MOUTH AT BEDTIME 90 tablet 2  . simvastatin (ZOCOR) 40 MG tablet TAKE 1 TABLET BY MOUTH AT BEDTIME 90 tablet 2  . sodium chloride (OCEAN) 0.65 % SOLN nasal spray Place 1 spray into both nostrils 2 (two) times daily as needed for congestion.     . traMADol (ULTRAM) 50 MG tablet TAKE 1 TABLET EVERY 6 HOURS AS NEEDED FOR MODERATE PAIN 30 tablet 0  . TURMERIC PO Take 1 tablet by mouth daily. Reported on 12/19/2015    . valsartan (DIOVAN) 320 MG tablet Take 1 tablet (320 mg total) by mouth  daily. 90 tablet 2  . vitamin B-12 (CYANOCOBALAMIN) 1000 MCG tablet Take 1,000 mcg by mouth daily.     . Vitamin D, Cholecalciferol, 1000 UNITS CAPS Take 1 capsule by mouth daily.    Marland Kitchen NITROSTAT 0.4 MG SL tablet PLACE 1 TABLET UNDER THE TONGUE EVERY 5 MINUTES AS NEEDED FOR CHEST PAIN (Patient not taking: Reported on 02/26/2016) 25 tablet 0   No current facility-administered medications on file prior to visit.    Allergies  Allergen Reactions  . Neomycin-Bacitracin Zn-Polymyx Rash    Polysporin- is tolerated   . Niacin Other (See Comments) and Cough    "cough til I threw up" (12/19/2012)  . Ciprofloxacin Hives    Got cipro and flagyl at same time, localized hives to IV arm  . Flagyl [Metronidazole] Hives    Got cipro and flagyl at same time, localized hives to IV arm    Family History  Problem Relation Age of Onset  . Heart disease Father   . Pneumonia Father   . Hypertension Father   . Hyperlipidemia Father   . Cancer Father     skin  . Stroke Father   . Cancer Paternal Aunt   . Alzheimer's disease Mother   . Heart disease Mother   . Emphysema Brother     marijuana and cigarettes  . Alcohol abuse Brother   . Hearing loss Brother   . Diabetes Maternal Grandmother   . Alzheimer's disease Paternal Grandmother   . Cancer Paternal Grandmother     lung?- smoker  . Hyperlipidemia Paternal Grandmother   . Heart attack Paternal Grandfather   . Alcohol abuse Paternal Grandfather   . Neurofibromatosis Son     schwanomatosis  . Neurofibromatosis Son     swanomatosis    Social History   Social History  . Marital Status: Married    Spouse Name: N/A  . Number of Children: N/A  . Years of Education: N/A   Social History Main Topics  . Smoking status: Never Smoker   . Smokeless tobacco: Never Used  . Alcohol Use: 0.0 oz/week    0 Standard drinks or equivalent per week     Comment: 12/19/2012 "glass of wine q night q other month or so" beer with pizza   . Drug Use: No  .  Sexual Activity: No   Other Topics Concern  . None   Social History Narrative   Married    children   Review of Systems - See HPI.  All other ROS are negative.  BP 140/65 mmHg  Pulse 70  Temp(Src) 98 F (36.7 C) (Oral)  Resp 14  Wt 201 lb 12.8 oz (91.536 kg)  SpO2 97%  Physical Exam  Constitutional: She is well-developed, well-nourished, and in no distress.  HENT:  Head: Normocephalic and atraumatic.  Right Ear: External ear normal.  Left Ear: External ear normal.  Nose: Nose normal.  Mouth/Throat: Oropharynx is clear and moist. No oropharyngeal exudate.  TM within normal limits  Eyes: Conjunctivae are normal.  Neck: Neck supple.  Cardiovascular: Normal rate, regular rhythm, normal heart sounds and intact distal pulses.   Pulmonary/Chest: Effort normal and breath sounds normal. No respiratory distress. She has no wheezes. She has no rales. She exhibits no tenderness.  Abdominal: Soft. Bowel sounds are normal. There is no tenderness.  Lymphadenopathy:    She has no cervical adenopathy.  Skin: Skin is warm and dry. No rash noted.  Psychiatric: Affect normal.  Vitals reviewed.   Recent Results (from the past 2160 hour(s))  TSH     Status: Abnormal   Collection Time: 02/23/16  8:16 AM  Result Value Ref Range   TSH 0.31 (L) 0.35 - 4.50 uIU/mL  CBC     Status: None   Collection Time: 02/23/16  8:16 AM  Result Value Ref Range   WBC 6.4 4.0 - 10.5 K/uL   RBC 4.56 3.87 - 5.11 Mil/uL   Platelets 307.0 150.0 - 400.0 K/uL   Hemoglobin 12.3 12.0 - 15.0 g/dL   HCT 37.2 36.0 - 46.0 %   MCV 81.6 78.0 - 100.0 fl   MCHC 33.2 30.0 - 36.0 g/dL   RDW 14.5 11.5 - 15.5 %  Vit D  25 hydroxy (rtn osteoporosis monitoring)     Status: None   Collection Time: 02/23/16  8:16 AM  Result Value Ref Range   VITD 31.85 30.00 - 100.00 ng/mL  Vitamin B12     Status: Abnormal   Collection Time: 02/23/16  8:16 AM  Result Value Ref Range   Vitamin B-12 >1500 (H) 211 - 911 pg/mL  Lipid panel      Status: Abnormal   Collection Time: 02/23/16  8:16 AM  Result Value Ref Range   Cholesterol 123 0 - 200 mg/dL    Comment: ATP III Classification       Desirable:  < 200 mg/dL               Borderline High:  200 - 239 mg/dL          High:  > = 240 mg/dL   Triglycerides 116.0 0.0 - 149.0 mg/dL    Comment: Normal:  <150 mg/dLBorderline High:  150 - 199 mg/dL   HDL 38.40 (L) >39.00 mg/dL   VLDL 23.2 0.0 - 40.0 mg/dL   LDL Cholesterol 62 0 - 99 mg/dL   Total CHOL/HDL Ratio 3     Comment:                Men          Women1/2 Average Risk     3.4          3.3Average Risk          5.0          4.42X Average Risk          9.6          7.13X Average Risk          15.0          11.0  NonHDL 84.92     Comment: NOTE:  Non-HDL goal should be 30 mg/dL higher than patient's LDL goal (i.e. LDL goal of < 70 mg/dL, would have non-HDL goal of < 100 mg/dL)  Comprehensive metabolic panel     Status: Abnormal   Collection Time: 02/23/16  8:16 AM  Result Value Ref Range   Sodium 132 (L) 135 - 145 mEq/L   Potassium 3.9 3.5 - 5.1 mEq/L   Chloride 94 (L) 96 - 112 mEq/L   CO2 30 19 - 32 mEq/L   Glucose, Bld 99 70 - 99 mg/dL   BUN 13 6 - 23 mg/dL   Creatinine, Ser 0.70 0.40 - 1.20 mg/dL   Total Bilirubin 0.3 0.2 - 1.2 mg/dL   Alkaline Phosphatase 81 39 - 117 U/L   AST 16 0 - 37 U/L   ALT 12 0 - 35 U/L   Total Protein 6.8 6.0 - 8.3 g/dL   Albumin 4.1 3.5 - 5.2 g/dL   Calcium 8.9 8.4 - 10.5 mg/dL   GFR 87.54 >60.00 mL/min    Assessment/Plan: G E R D Cough fits picture of uncontrolled GERD. Will change omeprazole to Protonix for 2 week trial. Continue Zantac and probiotic. GERD diet reviewed.

## 2016-03-30 NOTE — Patient Instructions (Signed)
Please increase fluids. Follow the diet below. Continue the Zantac in the evening but switch the Prilosec for Protonix. I have sent in a prescription.  Follow-up with Dr. Charlett Blake in 2 weeks. Return sooner if needed.  Food Choices for Gastroesophageal Reflux Disease, Adult When you have gastroesophageal reflux disease (GERD), the foods you eat and your eating habits are very important. Choosing the right foods can help ease the discomfort of GERD. WHAT GENERAL GUIDELINES DO I NEED TO FOLLOW?  Choose fruits, vegetables, whole grains, low-fat dairy products, and low-fat meat, fish, and poultry.  Limit fats such as oils, salad dressings, butter, nuts, and avocado.  Keep a food diary to identify foods that cause symptoms.  Avoid foods that cause reflux. These may be different for different people.  Eat frequent small meals instead of three large meals each day.  Eat your meals slowly, in a relaxed setting.  Limit fried foods.  Cook foods using methods other than frying.  Avoid drinking alcohol.  Avoid drinking large amounts of liquids with your meals.  Avoid bending over or lying down until 2-3 hours after eating. WHAT FOODS ARE NOT RECOMMENDED? The following are some foods and drinks that may worsen your symptoms: Vegetables Tomatoes. Tomato juice. Tomato and spaghetti sauce. Chili peppers. Onion and garlic. Horseradish. Fruits Oranges, grapefruit, and lemon (fruit and juice). Meats High-fat meats, fish, and poultry. This includes hot dogs, ribs, ham, sausage, salami, and bacon. Dairy Whole milk and chocolate milk. Sour cream. Cream. Butter. Ice cream. Cream cheese.  Beverages Coffee and tea, with or without caffeine. Carbonated beverages or energy drinks. Condiments Hot sauce. Barbecue sauce.  Sweets/Desserts Chocolate and cocoa. Donuts. Peppermint and spearmint. Fats and Oils High-fat foods, including [place] fries and potato chips. Other Vinegar. Strong spices, such as  black pepper, white pepper, red pepper, cayenne, curry powder, cloves, ginger, and chili powder. The items listed above may not be a complete list of foods and beverages to avoid. Contact your dietitian for more information.   This information is not intended to replace advice given to you by your health care provider. Make sure you discuss any questions you have with your health care provider.   Document Released: 11/29/2005 Document Revised: 12/20/2014 Document Reviewed: 10/03/2013 Elsevier Interactive Patient Education [employer].

## 2016-03-30 NOTE — Progress Notes (Signed)
Pre visit review using our clinic review tool, if applicable. No additional management support is needed unless otherwise documented below in the visit note. 

## 2016-03-30 NOTE — Progress Notes (Signed)
Pre visit review using our clinic review tool, if applicable. No additional management support is needed unless otherwise documented below in the visit note.  Pt tolerated injection well. No S/S of a reaction. Pt reports ongoing cough, not improved since last OV. Afebrile. Ok per Dr. Charlett Blake to give vaccine and have pt follow up in office. Appt scheduled today w/ Elyn Aquas, PA-C at 11:30am.  Dorrene German, RN

## 2016-03-30 NOTE — Assessment & Plan Note (Signed)
Cough fits picture of uncontrolled GERD. Will change omeprazole to Protonix for 2 week trial. Continue Zantac and probiotic. GERD diet reviewed.

## 2016-04-01 DIAGNOSIS — M170 Bilateral primary osteoarthritis of knee: Secondary | ICD-10-CM | POA: Diagnosis not present

## 2016-04-04 ENCOUNTER — Other Ambulatory Visit: Payer: Self-pay | Admitting: Family Medicine

## 2016-04-06 DIAGNOSIS — M47812 Spondylosis without myelopathy or radiculopathy, cervical region: Secondary | ICD-10-CM | POA: Diagnosis not present

## 2016-04-06 DIAGNOSIS — M9901 Segmental and somatic dysfunction of cervical region: Secondary | ICD-10-CM | POA: Diagnosis not present

## 2016-04-11 ENCOUNTER — Other Ambulatory Visit: Payer: Self-pay | Admitting: Family Medicine

## 2016-04-12 NOTE — Telephone Encounter (Signed)
Faxed hardcopy for tramadol to [facility] in [place]. 

## 2016-04-15 DIAGNOSIS — M47812 Spondylosis without myelopathy or radiculopathy, cervical region: Secondary | ICD-10-CM | POA: Diagnosis not present

## 2016-04-15 DIAGNOSIS — M9901 Segmental and somatic dysfunction of cervical region: Secondary | ICD-10-CM | POA: Diagnosis not present

## 2016-04-22 DIAGNOSIS — M9901 Segmental and somatic dysfunction of cervical region: Secondary | ICD-10-CM | POA: Diagnosis not present

## 2016-04-22 DIAGNOSIS — M47812 Spondylosis without myelopathy or radiculopathy, cervical region: Secondary | ICD-10-CM | POA: Diagnosis not present

## 2016-04-26 ENCOUNTER — Ambulatory Visit (INDEPENDENT_AMBULATORY_CARE_PROVIDER_SITE_OTHER): Payer: PPO | Admitting: Family Medicine

## 2016-04-26 ENCOUNTER — Encounter: Payer: Self-pay | Admitting: Family Medicine

## 2016-04-26 ENCOUNTER — Ambulatory Visit (HOSPITAL_BASED_OUTPATIENT_CLINIC_OR_DEPARTMENT_OTHER)
Admission: RE | Admit: 2016-04-26 | Discharge: 2016-04-26 | Disposition: A | Discharge status: Home / Self Care | Payer: PPO | Source: Ambulatory Visit | Attending: Family Medicine | Admitting: Family Medicine

## 2016-04-26 VITALS — BP 134/78 | HR 80 | Temp 98.7°F | Ht 62.0 in | Wt 201.0 lb

## 2016-04-26 DIAGNOSIS — J309 Allergic rhinitis, unspecified: Secondary | ICD-10-CM

## 2016-04-26 DIAGNOSIS — K219 Gastro-esophageal reflux disease without esophagitis: Secondary | ICD-10-CM | POA: Insufficient documentation

## 2016-04-26 DIAGNOSIS — I10 Essential (primary) hypertension: Secondary | ICD-10-CM | POA: Diagnosis not present

## 2016-04-26 DIAGNOSIS — J4520 Mild intermittent asthma, uncomplicated: Secondary | ICD-10-CM

## 2016-04-26 DIAGNOSIS — E782 Mixed hyperlipidemia: Secondary | ICD-10-CM

## 2016-04-26 DIAGNOSIS — R05 Cough: Secondary | ICD-10-CM | POA: Diagnosis not present

## 2016-04-26 DIAGNOSIS — J302 Other seasonal allergic rhinitis: Secondary | ICD-10-CM | POA: Insufficient documentation

## 2016-04-26 DIAGNOSIS — R059 Cough, unspecified: Secondary | ICD-10-CM

## 2016-04-26 MED ORDER — OMEPRAZOLE 40 MG PO CPDR: 40.0000 mg | DELAYED_RELEASE_CAPSULE | Freq: Every day | ORAL | Status: DC

## 2016-04-26 NOTE — Progress Notes (Signed)
Pre visit review using our clinic review tool, if applicable. No additional management support is needed unless otherwise documented below in the visit note. 

## 2016-04-26 NOTE — Patient Instructions (Addendum)
Mylanta and NTG as needed for chest discomfort/heartburn  Food Choices for Gastroesophageal Reflux Disease, Adult When you have gastroesophageal reflux disease (GERD), the foods you eat and your eating habits are very important. Choosing the right foods can help ease the discomfort of GERD. WHAT GENERAL GUIDELINES DO I NEED TO FOLLOW?  Choose fruits, vegetables, whole grains, low-fat dairy products, and low-fat meat, fish, and poultry.  Limit fats such as oils, salad dressings, butter, nuts, and avocado.  Keep a food diary to identify foods that cause symptoms.  Avoid foods that cause reflux. These may be different for different people.  Eat frequent small meals instead of three large meals each day.  Eat your meals slowly, in a relaxed setting.  Limit fried foods.  Cook foods using methods other than frying.  Avoid drinking alcohol.  Avoid drinking large amounts of liquids with your meals.  Avoid bending over or lying down until 2-3 hours after eating. WHAT FOODS ARE NOT RECOMMENDED? The following are some foods and drinks that may worsen your symptoms: Vegetables Tomatoes. Tomato juice. Tomato and spaghetti sauce. Chili peppers. Onion and garlic. Horseradish. Fruits Oranges, grapefruit, and lemon (fruit and juice). Meats High-fat meats, fish, and poultry. This includes hot dogs, ribs, ham, sausage, salami, and bacon. Dairy Whole milk and chocolate milk. Sour cream. Cream. Butter. Ice cream. Cream cheese.  Beverages Coffee and tea, with or without caffeine. Carbonated beverages or energy drinks. Condiments Hot sauce. Barbecue sauce.  Sweets/Desserts Chocolate and cocoa. Donuts. Peppermint and spearmint. Fats and Oils High-fat foods, including [place] fries and potato chips. Other Vinegar. Strong spices, such as black pepper, white pepper, red pepper, cayenne, curry powder, cloves, ginger, and chili powder. The items listed above may not be a complete list of foods and  beverages to avoid. Contact your dietitian for more information.   This information is not intended to replace advice given to you by your health care provider. Make sure you discuss any questions you have with your health care provider.   Document Released: 11/29/2005 Document Revised: 12/20/2014 Document Reviewed: 10/03/2013 Elsevier Interactive Patient Education [employer].

## 2016-04-27 ENCOUNTER — Telehealth: Payer: Self-pay | Admitting: Internal Medicine

## 2016-04-27 NOTE — Telephone Encounter (Signed)
Closing encounter.  Jeani Hawking with [facility] thinks she found the note and this is not needed.  She will call back if needed.

## 2016-04-29 DIAGNOSIS — G4733 Obstructive sleep apnea (adult) (pediatric): Secondary | ICD-10-CM | POA: Diagnosis not present

## 2016-05-02 NOTE — Assessment & Plan Note (Signed)
Persistent congestion. Continue Flonase, nasal saline, Cetirizine and add Montelukast. Referred to ENT for further consideration due to persistent cough

## 2016-05-02 NOTE — Assessment & Plan Note (Signed)
Encouraged heart healthy diet, increase exercise, avoid trans fats, consider a krill oil cap daily 

## 2016-05-02 NOTE — Assessment & Plan Note (Signed)
Avoid offending foods, continue probiotics. Do not eat large meals in late evening and consider raising head of bed. Take PPI and H2 blocker daily

## 2016-05-02 NOTE — Assessment & Plan Note (Signed)
Well controlled, no changes to meds. Encouraged heart healthy diet such as the DASH diet and exercise as tolerated.  °

## 2016-05-02 NOTE — Progress Notes (Signed)
Patient ID: Jackie Stevens, female   DOB: 31-Jan-1946, 70 y.o.   MRN: [id number]   Subjective:    Patient ID: Jackie Stevens, female    DOB: 06/06/1946, 70 y.o.   MRN: [id number]  Chief Complaint  Patient presents with  . Follow-up    HPI Patient is in today for follow up. She has been struggling with a cough for a couple of months. Is having PND with cough productive clear sputum that can be so thick it gags her and causes vomiting. She  Notes sob and chest pain at times during coughing spells. Does not feel treatment thus far has been helpful denies fevers, chills, headache. No GU complaints.  Past Medical History  Diagnosis Date  . Allergic rhinitis   . GERD (gastroesophageal reflux disease)   . Hyperlipidemia   . Chicken pox as a child  . Measles as a child  . Insomnia   . Thyroid disease   . Constipation   . Diverticulitis 10/25/2012    pt. reports that a drain was placed - 09/2012    . Abnormal cervical cytology 10/25/2012    Follows with Dr Leavy Cella of Gyn  . Asthma   . Heart murmur   . Complication of anesthesia   . PONV (postoperative nausea and vomiting)   . HTN (hypertension)     stress test completed by Anselm Lis, diagnosed as GERD  . Thyroid cancer ([place]) 1980's  . Hypothyroidism   . H/O hiatal hernia   . External hemorrhoid, bleeding     "sometimes" (02-Jan-2013)  . Arthritis     "knees; right thumb; shoulders" (January 02, 2013)  . Kidney stones 1970's    "passed on their own" (01-02-13)  . OSA on CPAP   . UTI (urinary tract infection) 04/02/2013  . Anemia 10/06/2013  . Preventative health care 10/06/2013    [facility] D[facility]at[facility] Pneumovax in 2012  . Low back pain 06/09/2014  . Medicare annual wellness visit, subsequent 10/06/2013    [facility] of D[facility]at[facility] Pneumovax in 2012   . Paresthesia 03/23/2015    Left face  . Anginal pain ([place])     pt has history of CP states had cardiac workup with no specific issues identified   . Shortness of breath  dyspnea     using stairs  . Anxiety     when increased stress   . Incontinence   . Preventative health care 02/26/2016    Past Surgical History  Procedure Laterality Date  . Thyroidectomy, partial  1988    "then did iodine to remove the rest" (2013/01/02)  . Sigmoid resection / rectopexy  02-Jan-2013  . Colon surgery    . Tonsillectomy  1951?  Marland Kitchen Cholecystectomy  1990  . Vaginal hysterectomy  1970's    "still have my ovaries" (2013-01-02)  . Dilation and curettage of uterus  1960's    "lots of them; had miscarriages" (01/02/2013)  . Transrectal drainage of pelvic abscess  10/27/2012  . Colostomy revision  02-Jan-2013    Procedure: COLON RESECTION SIGMOID;  Surgeon: Gwenyth Ober, MD;  Location: [facility];  Service: General;  Laterality: N/A;  . Cystoscopy with stent placement  01-02-2013    Procedure: CYSTOSCOPY WITH STENT PLACEMENT;  Surgeon: Hanley Ben, MD;  Location: [facility];  Service: Urology;  Laterality: N/A;  . Lysis of adhesion N/A 12/02/2015    Procedure: LAPAROSCOPIC LYSIS OF ADHESION;  Surgeon: Johnathan Hausen, MD;  Location: [facility];  Service: General;  Laterality: N/A;  .  Robotic assisted bilateral salpingo oopherectomy Bilateral 12/02/2015    Procedure: XI ROBOTIC ASSISTED BILATERAL SALPINGO OOPHORECTOMY;  Surgeon: Everitt Amber, MD;  Location: [facility];  Service: Gynecology;  Laterality: Bilateral;    Family History  Problem Relation Age of Onset  . Heart disease Father   . Pneumonia Father   . Hypertension Father   . Hyperlipidemia Father   . Cancer Father     skin  . Stroke Father   . Cancer Paternal Aunt   . Alzheimer's disease Mother   . Heart disease Mother   . Emphysema Brother     marijuana and cigarettes  . Alcohol abuse Brother   . Hearing loss Brother   . Diabetes Maternal Grandmother   . Alzheimer's disease Paternal Grandmother   . Cancer Paternal Grandmother     lung?- smoker  . Hyperlipidemia Paternal Grandmother   . Heart attack Paternal Grandfather   .  Alcohol abuse Paternal Grandfather   . Neurofibromatosis Son     schwanomatosis  . Neurofibromatosis Son     swanomatosis    Social History   Social History  . Marital Status: Married    Spouse Name: N/A  . Number of Children: N/A  . Years of Education: N/A   Occupational History  . Not on file.   Social History Main Topics  . Smoking status: Never Smoker   . Smokeless tobacco: Never Used  . Alcohol Use: 0.0 oz/week    0 Standard drinks or equivalent per week     Comment: 12/19/2012 "glass of wine q night q other month or so" beer with pizza   . Drug Use: No  . Sexual Activity: No   Other Topics Concern  . Not on file   Social History Narrative   Married    children    Outpatient Prescriptions Prior to Visit  Medication Sig Dispense Refill  . aspirin EC 81 MG tablet Take 81 mg by mouth daily. Reported on 12/19/2015    . cetirizine (ZYRTEC) 10 MG tablet Take 1 tablet (10 mg total) by mouth daily. (Patient taking differently: Take 10 mg by mouth daily as needed for allergies. ) 30 tablet 11  . docusate sodium (COLACE) 100 MG capsule Take 100 mg by mouth 2 (two) times daily. Reported on 03/30/2016    . fluticasone (FLONASE) 50 MCG/ACT nasal spray PLACE 2 SPRAYS INTO THE NOSE DAILY 16 g 1  . hydrochlorothiazide (HYDRODIURIL) 25 MG tablet Take 1 tablet (25 mg total) by mouth daily. 90 tablet 2  . KRILL OIL PO Take 750 mg by mouth daily.     Marland Kitchen levothyroxine (SYNTHROID, LEVOTHROID) 150 MCG tablet Take 150-225 mcg by mouth daily before breakfast. 1 tablet (150 mcg) every day except Sunday, 1.5 tablet (225 mcg) on Sunday    . naproxen (NAPROSYN) 375 MG tablet TAKE 1 TABLET BY MOUTH TWICE A DAY WITH A MEAL 180 tablet 0  . NITROSTAT 0.4 MG SL tablet PLACE 1 TABLET UNDER THE TONGUE EVERY 5 MINUTES AS NEEDED FOR CHEST PAIN 25 tablet 0  . Polyethyl Glycol-Propyl Glycol 0.4-0.3 % GEL Apply 1 drop to eye 2 (two) times daily as needed (dry eyes).     . polyethylene glycol (MIRALAX /  GLYCOLAX) packet Take 17 g by mouth daily.    . Probiotic Product (PROBIOTIC ADVANCED PO) Take 2 tablets by mouth at bedtime. Digestive Advantage    . psyllium (REGULOID) 0.52 G capsule Take 0.52 g by mouth at bedtime.    Marland Kitchen  ranitidine (ZANTAC) 150 MG tablet TAKE 1 TABLET BY MOUTH AT BEDTIME 90 tablet 2  . simvastatin (ZOCOR) 40 MG tablet TAKE 1 TABLET BY MOUTH AT BEDTIME 90 tablet 2  . sodium chloride (OCEAN) 0.65 % SOLN nasal spray Place 1 spray into both nostrils 2 (two) times daily as needed for congestion.     . traMADol (ULTRAM) 50 MG tablet TAKE 1 TABLET BY MOUTH EVERY 6 HOURS AS NEEDED FOR MODERATE PAIN 30 tablet 0  . TURMERIC PO Take 1 tablet by mouth daily. Reported on 12/19/2015    . valsartan (DIOVAN) 320 MG tablet Take 1 tablet (320 mg total) by mouth daily. 90 tablet 2  . Vitamin D, Cholecalciferol, 1000 UNITS CAPS Take 1 capsule by mouth daily.    . fluticasone (FLONASE) 50 MCG/ACT nasal spray Place 2 sprays into both nostrils daily. (Patient taking differently: Place 2 sprays into both nostrils daily as needed for allergies. ) 16 g 2  . pantoprazole (PROTONIX) 20 MG tablet Take 1 tablet (20 mg total) by mouth daily. 30 tablet 1  . vitamin B-12 (CYANOCOBALAMIN) 1000 MCG tablet Take 1,000 mcg by mouth daily.      No facility-administered medications prior to visit.    Allergies  Allergen Reactions  . Neomycin-Bacitracin Zn-Polymyx Rash    Polysporin- is tolerated   . Niacin Other (See Comments) and Cough    "cough til I threw up" (12/19/2012)  . Ciprofloxacin Hives    Got cipro and flagyl at same time, localized hives to IV arm  . Flagyl [Metronidazole] Hives    Got cipro and flagyl at same time, localized hives to IV arm    Review of Systems  Constitutional: Positive for malaise/fatigue. Negative for fever.  HENT: Positive for congestion.   Eyes: Negative for blurred vision.  Respiratory: Positive for cough, sputum production and shortness of breath.   Cardiovascular:  Negative for chest pain, palpitations and leg swelling.  Gastrointestinal: Positive for heartburn and vomiting. Negative for nausea, abdominal pain and blood in stool.  Genitourinary: Negative for dysuria and frequency.  Musculoskeletal: Negative for falls.  Skin: Negative for rash.  Neurological: Negative for dizziness, loss of consciousness and headaches.  Endo/Heme/Allergies: Negative for environmental allergies.  Psychiatric/Behavioral: Negative for depression. The patient is not nervous/anxious.        Objective:    Physical Exam  Constitutional: She is oriented to person, place, and time. She appears well-developed and well-nourished. No distress.  HENT:  Head: Normocephalic and atraumatic.  Nose: Nose normal.  Eyes: Right eye exhibits no discharge. Left eye exhibits no discharge.  Neck: Normal range of motion. Neck supple.  Cardiovascular: Normal rate and regular rhythm.   No murmur heard. Pulmonary/Chest: Effort normal and breath sounds normal.  Abdominal: Soft. Bowel sounds are normal. There is no tenderness.  Musculoskeletal: She exhibits no edema.  Neurological: She is alert and oriented to person, place, and time.  Skin: Skin is warm and dry.  Psychiatric: She has a normal mood and affect.  Nursing note and vitals reviewed.   BP 134/78 mmHg  Pulse 80  Temp(Src) 98.7 F (37.1 C) (Oral)  Ht 5\' 2"  (1.575 m)  Wt 201 lb (91.173 kg)  BMI 36.75 kg/m2  SpO2 95% Wt Readings from Last 3 Encounters:  04/26/16 201 lb (91.173 kg)  03/30/16 201 lb 12.8 oz (91.536 kg)  02/26/16 200 lb 4 oz (90.833 kg)     Lab Results  Component Value Date   WBC 6.4 02/23/2016  HGB 12.3 02/23/2016   HCT 37.2 02/23/2016   PLT 307.0 02/23/2016   GLUCOSE 99 02/23/2016   CHOL 123 02/23/2016   TRIG 116.0 02/23/2016   HDL 38.40* 02/23/2016   LDLCALC 62 02/23/2016   ALT 12 02/23/2016   AST 16 02/23/2016   NA 132* 02/23/2016   K 3.9 02/23/2016   CL 94* 02/23/2016   CREATININE 0.70  02/23/2016   BUN 13 02/23/2016   CO2 30 02/23/2016   TSH 0.31* 02/23/2016   INR 0.99 03/16/2015   HGBA1C 5.8* 07/18/2011    Lab Results  Component Value Date   TSH 0.31* 02/23/2016   Lab Results  Component Value Date   WBC 6.4 02/23/2016   HGB 12.3 02/23/2016   HCT 37.2 02/23/2016   MCV 81.6 02/23/2016   PLT 307.0 02/23/2016   Lab Results  Component Value Date   NA 132* 02/23/2016   K 3.9 02/23/2016   CO2 30 02/23/2016   GLUCOSE 99 02/23/2016   BUN 13 02/23/2016   CREATININE 0.70 02/23/2016   BILITOT 0.3 02/23/2016   ALKPHOS 81 02/23/2016   AST 16 02/23/2016   ALT 12 02/23/2016   PROT 6.8 02/23/2016   ALBUMIN 4.1 02/23/2016   CALCIUM 8.9 02/23/2016   ANIONGAP 6 12/03/2015   GFR 87.54 02/23/2016   Lab Results  Component Value Date   CHOL 123 02/23/2016   Lab Results  Component Value Date   HDL 38.40* 02/23/2016   Lab Results  Component Value Date   LDLCALC 62 02/23/2016   Lab Results  Component Value Date   TRIG 116.0 02/23/2016   Lab Results  Component Value Date   CHOLHDL 3 02/23/2016   Lab Results  Component Value Date   HGBA1C 5.8* 07/18/2011       Assessment & Plan:   Problem List Items Addressed This Visit    Hyperlipidemia, mixed    Encouraged heart healthy diet, increase exercise, avoid trans fats, consider a krill oil cap daily      G E R D    Avoid offending foods, continue probiotics. Do not eat large meals in late evening and consider raising head of bed. Take PPI and H2 blocker daily      Relevant Medications   omeprazole (PRILOSEC) 40 MG capsule   Other Relevant Orders   Ambulatory referral to Gastroenterology   Ambulatory referral to ENT   DG Chest 2 View (Completed)   Essential hypertension    Well controlled, no changes to meds. Encouraged heart healthy diet such as the DASH diet and exercise as tolerated.       Cough - Primary   Relevant Medications   omeprazole (PRILOSEC) 40 MG capsule   Other Relevant Orders    Ambulatory referral to Gastroenterology   Ambulatory referral to ENT   DG Chest 2 View (Completed)   Asthma, mild intermittent    cxr unremarkable referred to pulmonology due to persistent cough      Allergic rhinitis    Persistent congestion. Continue Flonase, nasal saline, Cetirizine and add Montelukast. Referred to ENT for further consideration due to persistent cough       Other Visit Diagnoses    Seasonal allergies        Relevant Medications    omeprazole (PRILOSEC) 40 MG capsule    Other Relevant Orders    Ambulatory referral to Gastroenterology    Ambulatory referral to ENT    DG Chest 2 View (Completed)  I have discontinued Ms. Zaragoza's vitamin B-12 and pantoprazole. I am also having her start on omeprazole. Additionally, I am having her maintain her levothyroxine, psyllium, cetirizine, KRILL OIL PO, TURMERIC PO, Polyethyl Glycol-Propyl Glycol, sodium chloride, Probiotic Product (PROBIOTIC ADVANCED PO), Vitamin D (Cholecalciferol), docusate sodium, NITROSTAT, ranitidine, aspirin EC, polyethylene glycol, valsartan, hydrochlorothiazide, simvastatin, naproxen, fluticasone, and traMADol.  Meds ordered this encounter  Medications  . omeprazole (PRILOSEC) 40 MG capsule    Sig: Take 1 capsule (40 mg total) by mouth daily.    Dispense:  30 capsule    Refill:  0     Penni Homans, MD

## 2016-05-02 NOTE — Assessment & Plan Note (Signed)
cxr unremarkable referred to pulmonology due to persistent cough

## 2016-05-04 ENCOUNTER — Telehealth: Payer: Self-pay | Admitting: Internal Medicine

## 2016-05-04 NOTE — Telephone Encounter (Signed)
The form has been signed by Dr. Annamaria Boots and it was faxed to [employer] with confirmation received that it went through

## 2016-05-04 NOTE — Telephone Encounter (Signed)
Patient calling to let us know that she has not received her CPAP supplies.  She said that [facility] advised her that it needed a PA.  Rodena Piety, have you received a CMN for this patient? Please advise.

## 2016-05-04 NOTE — Telephone Encounter (Signed)
I have a form from [employer] and I will give to [facility] to ask Dr. Annamaria Boots to sign

## 2016-05-05 DIAGNOSIS — K219 Gastro-esophageal reflux disease without esophagitis: Secondary | ICD-10-CM | POA: Diagnosis not present

## 2016-05-05 DIAGNOSIS — I10 Essential (primary) hypertension: Secondary | ICD-10-CM | POA: Diagnosis not present

## 2016-05-05 DIAGNOSIS — R05 Cough: Secondary | ICD-10-CM | POA: Diagnosis not present

## 2016-05-06 DIAGNOSIS — G4733 Obstructive sleep apnea (adult) (pediatric): Secondary | ICD-10-CM | POA: Diagnosis not present

## 2016-05-12 ENCOUNTER — Other Ambulatory Visit: Payer: Self-pay | Admitting: Family Medicine

## 2016-05-13 NOTE — Telephone Encounter (Signed)
Faxed hardcopy for tramadol to [facility] in [place]. 

## 2016-05-18 ENCOUNTER — Encounter: Payer: Self-pay | Admitting: Family Medicine

## 2016-05-18 ENCOUNTER — Other Ambulatory Visit: Payer: Self-pay | Admitting: Family Medicine

## 2016-05-18 NOTE — Telephone Encounter (Signed)
Faxed hardcopy for Tramadol to [facility] [place]. 

## 2016-05-18 NOTE — Telephone Encounter (Signed)
Last refill on 05/12/2016 and last appt 04/26/2016

## 2016-05-19 ENCOUNTER — Other Ambulatory Visit: Payer: Self-pay | Admitting: Family Medicine

## 2016-05-27 ENCOUNTER — Other Ambulatory Visit: Payer: PPO

## 2016-05-28 DIAGNOSIS — J300 Vasomotor rhinitis: Secondary | ICD-10-CM | POA: Diagnosis not present

## 2016-05-28 DIAGNOSIS — J343 Hypertrophy of nasal turbinates: Secondary | ICD-10-CM | POA: Diagnosis not present

## 2016-05-28 DIAGNOSIS — R05 Cough: Secondary | ICD-10-CM | POA: Diagnosis not present

## 2016-05-28 DIAGNOSIS — K219 Gastro-esophageal reflux disease without esophagitis: Secondary | ICD-10-CM | POA: Diagnosis not present

## 2016-06-01 ENCOUNTER — Ambulatory Visit: Payer: PPO | Admitting: Family Medicine

## 2016-06-02 DIAGNOSIS — G4733 Obstructive sleep apnea (adult) (pediatric): Secondary | ICD-10-CM | POA: Diagnosis not present

## 2016-06-02 DIAGNOSIS — K219 Gastro-esophageal reflux disease without esophagitis: Secondary | ICD-10-CM | POA: Diagnosis not present

## 2016-06-02 DIAGNOSIS — R05 Cough: Secondary | ICD-10-CM | POA: Diagnosis not present

## 2016-06-03 DIAGNOSIS — M9901 Segmental and somatic dysfunction of cervical region: Secondary | ICD-10-CM | POA: Diagnosis not present

## 2016-06-03 DIAGNOSIS — M47812 Spondylosis without myelopathy or radiculopathy, cervical region: Secondary | ICD-10-CM | POA: Diagnosis not present

## 2016-06-14 ENCOUNTER — Other Ambulatory Visit: Payer: Self-pay | Admitting: Family Medicine

## 2016-06-14 NOTE — Telephone Encounter (Signed)
Faxed hardcopy for Tr[facility] to [facility] in [place]

## 2016-06-14 NOTE — Telephone Encounter (Signed)
Requesting: Tramadol Contract   none UDS   none Last OV  04/26/2016 Last Refill  #30 with 0 refills on 05/18/16  Please Advise

## 2016-06-22 DIAGNOSIS — M47812 Spondylosis without myelopathy or radiculopathy, cervical region: Secondary | ICD-10-CM | POA: Diagnosis not present

## 2016-06-22 DIAGNOSIS — M9901 Segmental and somatic dysfunction of cervical region: Secondary | ICD-10-CM | POA: Diagnosis not present

## 2016-06-25 ENCOUNTER — Other Ambulatory Visit (INDEPENDENT_AMBULATORY_CARE_PROVIDER_SITE_OTHER): Payer: PPO

## 2016-06-25 DIAGNOSIS — K219 Gastro-esophageal reflux disease without esophagitis: Secondary | ICD-10-CM | POA: Diagnosis not present

## 2016-06-25 DIAGNOSIS — E079 Disorder of thyroid, unspecified: Secondary | ICD-10-CM | POA: Diagnosis not present

## 2016-06-25 DIAGNOSIS — M5410 Radiculopathy, site unspecified: Secondary | ICD-10-CM | POA: Diagnosis not present

## 2016-06-25 DIAGNOSIS — E538 Deficiency of other specified B group vitamins: Secondary | ICD-10-CM

## 2016-06-25 DIAGNOSIS — I10 Essential (primary) hypertension: Secondary | ICD-10-CM | POA: Diagnosis not present

## 2016-06-25 DIAGNOSIS — G4733 Obstructive sleep apnea (adult) (pediatric): Secondary | ICD-10-CM

## 2016-06-25 DIAGNOSIS — M5418 Radiculopathy, sacral and sacrococcygeal region: Secondary | ICD-10-CM

## 2016-06-25 DIAGNOSIS — K5900 Constipation, unspecified: Secondary | ICD-10-CM

## 2016-06-25 LAB — CBC
HCT: 34.8 % — ABNORMAL LOW (ref 36.0–46.0)
Hemoglobin: 11.6 g/dL — ABNORMAL LOW (ref 12.0–15.0)
MCHC: 33.3 g/dL (ref 30.0–36.0)
MCV: 85.4 fl (ref 78.0–100.0)
Platelets: 291 10*3/uL (ref 150.0–400.0)
RBC: 4.08 Mil/uL (ref 3.87–5.11)
RDW: 14.2 % (ref 11.5–15.5)
WBC: 7.1 10*3/uL (ref 4.0–10.5)

## 2016-06-25 LAB — COMPREHENSIVE METABOLIC PANEL
ALT: 19 U/L (ref 0–35)
AST: 20 U/L (ref 0–37)
Albumin: 3.9 g/dL (ref 3.5–5.2)
Alkaline Phosphatase: 86 U/L (ref 39–117)
BUN: 16 mg/dL (ref 6–23)
CO2: 32 mEq/L (ref 19–32)
Calcium: 8.7 mg/dL (ref 8.4–10.5)
Chloride: 96 mEq/L (ref 96–112)
Creatinine, Ser: 0.76 mg/dL (ref 0.40–1.20)
GFR: 79.54 mL/min (ref >60.00)
Glucose, Bld: 78 mg/dL (ref 70–99)
Potassium: 3.9 mEq/L (ref 3.5–5.1)
Sodium: 134 mEq/L — ABNORMAL LOW (ref 135–145)
Total Bilirubin: 0.3 mg/dL (ref 0.2–1.2)
Total Protein: 6.8 g/dL (ref 6.0–8.3)

## 2016-06-25 LAB — LIPID PANEL
Cholesterol: 152 mg/dL (ref 0–200)
HDL: 52.3 mg/dL (ref >39.00)
LDL Cholesterol: 78 mg/dL (ref 0–99)
NonHDL: 99.85
Total CHOL/HDL Ratio: 3
Triglycerides: 111 mg/dL (ref 0.0–149.0)
VLDL: 22.2 mg/dL (ref 0.0–40.0)

## 2016-06-25 LAB — VITAMIN B12: Vitamin B-12: 763 pg/mL (ref 211–911)

## 2016-06-25 LAB — TSH: TSH: 0.6 u[IU]/mL (ref 0.35–4.50)

## 2016-06-30 DIAGNOSIS — G4733 Obstructive sleep apnea (adult) (pediatric): Secondary | ICD-10-CM | POA: Diagnosis not present

## 2016-07-02 ENCOUNTER — Ambulatory Visit: Payer: PPO | Admitting: Family Medicine

## 2016-07-11 ENCOUNTER — Other Ambulatory Visit: Payer: Self-pay | Admitting: Family Medicine

## 2016-07-12 NOTE — Telephone Encounter (Signed)
Faxed hardcopy for tramadol to [facility] in [place]. 

## 2016-07-13 ENCOUNTER — Ambulatory Visit (INDEPENDENT_AMBULATORY_CARE_PROVIDER_SITE_OTHER): Payer: PPO | Admitting: Family Medicine

## 2016-07-13 ENCOUNTER — Encounter: Payer: Self-pay | Admitting: Family Medicine

## 2016-07-13 DIAGNOSIS — R05 Cough: Secondary | ICD-10-CM

## 2016-07-13 DIAGNOSIS — J302 Other seasonal allergic rhinitis: Secondary | ICD-10-CM

## 2016-07-13 DIAGNOSIS — K219 Gastro-esophageal reflux disease without esophagitis: Secondary | ICD-10-CM

## 2016-07-13 DIAGNOSIS — R059 Cough, unspecified: Secondary | ICD-10-CM

## 2016-07-13 DIAGNOSIS — E782 Mixed hyperlipidemia: Secondary | ICD-10-CM

## 2016-07-13 DIAGNOSIS — K5900 Constipation, unspecified: Secondary | ICD-10-CM | POA: Diagnosis not present

## 2016-07-13 DIAGNOSIS — D649 Anemia, unspecified: Secondary | ICD-10-CM

## 2016-07-13 DIAGNOSIS — I10 Essential (primary) hypertension: Secondary | ICD-10-CM

## 2016-07-13 DIAGNOSIS — E039 Hypothyroidism, unspecified: Secondary | ICD-10-CM

## 2016-07-13 MED ORDER — OMEPRAZOLE 40 MG PO CPDR: 40.0000 mg | DELAYED_RELEASE_CAPSULE | Freq: Two times a day (BID) | ORAL | 1 refills | Status: DC

## 2016-07-13 MED ORDER — RANITIDINE HCL 300 MG PO TABS: 300.0000 mg | ORAL_TABLET | Freq: Every day | ORAL | 5 refills | Status: DC

## 2016-07-13 NOTE — Progress Notes (Signed)
Pre visit review using our clinic review tool, if applicable. No additional management support is needed unless otherwise documented below in the visit note. 

## 2016-07-13 NOTE — Patient Instructions (Signed)
[place]  Food Choices for Gastroesophageal Reflux Disease, Adult When you have gastroesophageal reflux disease (GERD), the foods you eat and your eating habits are very important. Choosing the right foods can help ease the discomfort of GERD. WHAT GENERAL GUIDELINES DO I NEED TO FOLLOW?  Choose fruits, vegetables, whole grains, low-fat dairy products, and low-fat meat, fish, and poultry.  Limit fats such as oils, salad dressings, butter, nuts, and avocado.  Keep a food diary to identify foods that cause symptoms.  Avoid foods that cause reflux. These may be different for different people.  Eat frequent small meals instead of three large meals each day.  Eat your meals slowly, in a relaxed setting.  Limit fried foods.  Cook foods using methods other than frying.  Avoid drinking alcohol.  Avoid drinking large amounts of liquids with your meals.  Avoid bending over or lying down until 2-3 hours after eating. WHAT FOODS ARE NOT RECOMMENDED? The following are some foods and drinks that may worsen your symptoms: Vegetables Tomatoes. Tomato juice. Tomato and spaghetti sauce. Chili peppers. Onion and garlic. Horseradish. Fruits Oranges, grapefruit, and lemon (fruit and juice). Meats High-fat meats, fish, and poultry. This includes hot dogs, ribs, ham, sausage, salami, and bacon. Dairy Whole milk and chocolate milk. Sour cream. Cream. Butter. Ice cream. Cream cheese.  Beverages Coffee and tea, with or without caffeine. Carbonated beverages or energy drinks. Condiments Hot sauce. Barbecue sauce.  Sweets/Desserts Chocolate and cocoa. Donuts. Peppermint and spearmint. Fats and Oils High-fat foods, including [place] fries and potato chips. Other Vinegar. Strong spices, such as black pepper, white pepper, red pepper, cayenne, curry powder, cloves, ginger, and chili powder. The items listed above may not be a complete list of foods and beverages to avoid. Contact your  dietitian for more information.   This information is not intended to replace advice given to you by your health care provider. Make sure you discuss any questions you have with your health care provider.   Document Released: 11/29/2005 Document Revised: 12/20/2014 Document Reviewed: 10/03/2013 Elsevier Interactive Patient Education [employer].

## 2016-07-25 NOTE — Progress Notes (Signed)
Patient ID: Jackie Stevens, female   DOB: May 27, 1946, 70 y.o.   MRN: [id number]   Subjective:    Patient ID: Jackie Stevens, female    DOB: 05-18-1946, 70 y.o.   MRN: [id number]  Chief Complaint  Patient presents with  . Follow-up    HPI Patient is in today for follow up. She was treated for a bronchitis and improved but cough has just begun to return. It is non productive and no fevers are noted. Otherwise she reports feeling weel. She continues to follow with Dr Redmond Baseman of ENT and Dr Benson Norway of gastroenterology. Denies CP/palp/SOB/HA/fevers/GI or GU c/o. Taking meds as prescribed  Past Medical History:  Diagnosis Date  . Abnormal cervical cytology 10/25/2012   Follows with Dr Leavy Cella of Gyn  . Allergic rhinitis   . Anemia 10/06/2013  . Anginal pain ([place])    pt has history of CP states had cardiac workup with no specific issues identified   . Anxiety    when increased stress   . Arthritis    "knees; right thumb; shoulders" (12-20-2012)  . Asthma   . Chicken pox as a child  . Complication of anesthesia   . Constipation   . Diverticulitis 10/25/2012   pt. reports that a drain was placed - 09/2012    . External hemorrhoid, bleeding    "sometimes" (12-20-2012)  . GERD (gastroesophageal reflux disease)   . H/O hiatal hernia   . Heart murmur   . HTN (hypertension)    stress test completed by Anselm Lis, diagnosed as GERD  . Hyperlipidemia   . Hypothyroidism   . Incontinence   . Insomnia   . Kidney stones 1970's   "passed on their own" (December 20, 2012)  . Low back pain 06/09/2014  . Measles as a child  . Medicare annual wellness visit, subsequent 10/06/2013   Steinhoffer of Dermatology Pneumovax in 2012   . OSA on CPAP   . Paresthesia 03/23/2015   Left face  . PONV (postoperative nausea and vomiting)   . Preventative health care 10/06/2013   Steinhoffer of Dermatology Pneumovax in 2012  . Preventative health care 02/26/2016  . Shortness of breath dyspnea    using stairs  .  Thyroid cancer ([place]) 1980's  . Thyroid disease   . UTI (urinary tract infection) 04/02/2013    Past Surgical History:  Procedure Laterality Date  . CHOLECYSTECTOMY  1990  . COLON SURGERY    . COLOSTOMY REVISION  Dec 20, 2012   Procedure: COLON RESECTION SIGMOID;  Surgeon: Gwenyth Ober, MD;  Location: [facility];  Service: General;  Laterality: N/A;  . CYSTOSCOPY WITH STENT PLACEMENT  12-20-2012   Procedure: CYSTOSCOPY WITH STENT PLACEMENT;  Surgeon: Hanley Ben, MD;  Location: [facility];  Service: Urology;  Laterality: N/A;  . DILATION AND CURETTAGE OF UTERUS  1960's   "lots of them; had miscarriages" (Dec 20, 2012)  . LYSIS OF ADHESION N/A 12/02/2015   Procedure: LAPAROSCOPIC LYSIS OF ADHESION;  Surgeon: Johnathan Hausen, MD;  Location: [facility];  Service: General;  Laterality: N/A;  . ROBOTIC ASSISTED BILATERAL SALPINGO OOPHERECTOMY Bilateral 12/02/2015   Procedure: XI ROBOTIC ASSISTED BILATERAL SALPINGO OOPHORECTOMY;  Surgeon: Everitt Amber, MD;  Location: [facility];  Service: Gynecology;  Laterality: Bilateral;  . SIGMOID RESECTION / RECTOPEXY  12/20/2012  . THYROIDECTOMY, PARTIAL  1988   "then did iodine to remove the rest" (12-20-12)  . TONSILLECTOMY  1951?  . TRANSRECTAL DRAINAGE OF PELVIC ABSCESS  10/27/2012  . VAGINAL HYSTERECTOMY  1970's   "  still have my ovaries" (12/19/2012)    Family History  Problem Relation Age of Onset  . Heart disease Father   . Pneumonia Father   . Hypertension Father   . Hyperlipidemia Father   . Cancer Father     skin  . Stroke Father   . Cancer Paternal Aunt   . Alzheimer's disease Mother   . Heart disease Mother   . Emphysema Brother     marijuana and cigarettes  . Alcohol abuse Brother   . Hearing loss Brother   . Diabetes Maternal Grandmother   . Alzheimer's disease Paternal Grandmother   . Cancer Paternal Grandmother     lung?- smoker  . Hyperlipidemia Paternal Grandmother   . Heart attack Paternal Grandfather   . Alcohol abuse Paternal Grandfather   .  Neurofibromatosis Son     schwanomatosis  . Neurofibromatosis Son     swanomatosis    Social History   Social History  . Marital status: Married    Spouse name: N/A  . Number of children: N/A  . Years of education: N/A   Occupational History  . Not on file.   Social History Main Topics  . Smoking status: Never Smoker  . Smokeless tobacco: Never Used  . Alcohol use 0.0 oz/week     Comment: 12/19/2012 "glass of wine q night q other month or so" beer with pizza   . Drug use: No  . Sexual activity: No   Other Topics Concern  . Not on file   Social History Narrative   Married    children    Outpatient Medications Prior to Visit  Medication Sig Dispense Refill  . aspirin EC 81 MG tablet Take 81 mg by mouth daily. Reported on 12/19/2015    . cetirizine (ZYRTEC) 10 MG tablet Take 1 tablet (10 mg total) by mouth daily. (Patient taking differently: Take 10 mg by mouth daily as needed for allergies. ) 30 tablet 11  . docusate sodium (COLACE) 100 MG capsule Take 100 mg by mouth 2 (two) times daily. Reported on 03/30/2016    . fluticasone (FLONASE) 50 MCG/ACT nasal spray PLACE 2 SPRAYS INTO THE NOSE DAILY 16 g 1  . hydrochlorothiazide (HYDRODIURIL) 25 MG tablet Take 1 tablet (25 mg total) by mouth daily. 90 tablet 2  . KRILL OIL PO Take 750 mg by mouth daily.     Marland Kitchen levothyroxine (SYNTHROID, LEVOTHROID) 150 MCG tablet Take 150-225 mcg by mouth daily before breakfast. 1 tablet (150 mcg) every day except Sunday, 1.5 tablet (225 mcg) on Sunday    . naproxen (NAPROSYN) 375 MG tablet TAKE 1 TABLET BY MOUTH TWICE A DAY WITH A MEAL 180 tablet 0  . NITROSTAT 0.4 MG SL tablet PLACE 1 TABLET UNDER THE TONGUE EVERY 5 MINUTES AS NEEDED FOR CHEST PAIN 25 tablet 0  . Polyethyl Glycol-Propyl Glycol 0.4-0.3 % GEL Apply 1 drop to eye 2 (two) times daily as needed (dry eyes).     . polyethylene glycol (MIRALAX / GLYCOLAX) packet Take 17 g by mouth daily.    . Probiotic Product (PROBIOTIC ADVANCED PO) Take 2  tablets by mouth at bedtime. Digestive Advantage    . psyllium (REGULOID) 0.52 G capsule Take 0.52 g by mouth at bedtime.    . simvastatin (ZOCOR) 40 MG tablet TAKE 1 TABLET BY MOUTH AT BEDTIME 90 tablet 2  . sodium chloride (OCEAN) 0.65 % SOLN nasal spray Place 1 spray into both nostrils 2 (two) times daily as needed  for congestion.     . traMADol (ULTRAM) 50 MG tablet TAKE 1 TABLET BY MOUTH EVERY 6 HOURS AS NEEDED FOR MODERATE PAIN 30 tablet 0  . TURMERIC PO Take 1 tablet by mouth daily. Reported on 12/19/2015    . valsartan (DIOVAN) 320 MG tablet Take 1 tablet (320 mg total) by mouth daily. 90 tablet 2  . Vitamin D, Cholecalciferol, 1000 UNITS CAPS Take 1 capsule by mouth daily.    Marland Kitchen omeprazole (PRILOSEC) 40 MG capsule Take 1 capsule (40 mg total) by mouth daily. 30 capsule 0  . ranitidine (ZANTAC) 150 MG tablet TAKE 1 TABLET BY MOUTH AT BEDTIME 90 tablet 2   No facility-administered medications prior to visit.     Allergies  Allergen Reactions  . Neomycin-Bacitracin Zn-Polymyx Rash    Polysporin- is tolerated   . Niacin Other (See Comments) and Cough    "cough til I threw up" (12/19/2012)  . Ciprofloxacin Hives    Got cipro and flagyl at same time, localized hives to IV arm  . Flagyl [Metronidazole] Hives    Got cipro and flagyl at same time, localized hives to IV arm    Review of Systems  Constitutional: Negative for fever and malaise/fatigue.  HENT: Positive for congestion.   Eyes: Negative for blurred vision.  Respiratory: Positive for cough. Negative for shortness of breath.   Cardiovascular: Negative for chest pain, palpitations and leg swelling.  Gastrointestinal: Negative for abdominal pain, blood in stool and nausea.  Genitourinary: Negative for dysuria and frequency.  Musculoskeletal: Negative for falls.  Skin: Negative for rash.  Neurological: Negative for dizziness, loss of consciousness and headaches.  Endo/Heme/Allergies: Negative for environmental allergies.    Psychiatric/Behavioral: Negative for depression. The patient is not nervous/anxious.        Objective:    Physical Exam  Constitutional: She is oriented to person, place, and time. She appears well-developed and well-nourished. No distress.  HENT:  Head: Normocephalic and atraumatic.  Nose: Nose normal.  Eyes: Right eye exhibits no discharge. Left eye exhibits no discharge.  Neck: Normal range of motion. Neck supple.  Cardiovascular: Normal rate and regular rhythm.   No murmur heard. Pulmonary/Chest: Effort normal and breath sounds normal.  Abdominal: Soft. Bowel sounds are normal. There is no tenderness.  Musculoskeletal: She exhibits no edema.  Neurological: She is alert and oriented to person, place, and time.  Skin: Skin is warm and dry.  Psychiatric: She has a normal mood and affect.  Nursing note and vitals reviewed.   BP 132/78 (BP Location: Left Arm, Patient Position: Sitting, Cuff Size: Normal)   Pulse 80   Temp 98.3 F (36.8 C) (Oral)   Ht 5\' 2"  (1.575 m)   Wt 202 lb (91.6 kg)   SpO2 97%   BMI 36.95 kg/m  Wt Readings from Last 3 Encounters:  07/13/16 202 lb (91.6 kg)  04/26/16 201 lb (91.2 kg)  03/30/16 201 lb 12.8 oz (91.5 kg)     Lab Results  Component Value Date   WBC 7.1 06/25/2016   HGB 11.6 (L) 06/25/2016   HCT 34.8 (L) 06/25/2016   PLT 291.0 06/25/2016   GLUCOSE 78 06/25/2016   CHOL 152 06/25/2016   TRIG 111.0 06/25/2016   HDL 52.30 06/25/2016   LDLCALC 78 06/25/2016   ALT 19 06/25/2016   AST 20 06/25/2016   NA 134 (L) 06/25/2016   K 3.9 06/25/2016   CL 96 06/25/2016   CREATININE 0.76 06/25/2016   BUN 16 06/25/2016  CO2 32 06/25/2016   TSH 0.60 06/25/2016   INR 0.99 03/16/2015   HGBA1C 5.8 (H) 07/18/2011    Lab Results  Component Value Date   TSH 0.60 06/25/2016   Lab Results  Component Value Date   WBC 7.1 06/25/2016   HGB 11.6 (L) 06/25/2016   HCT 34.8 (L) 06/25/2016   MCV 85.4 06/25/2016   PLT 291.0 06/25/2016   Lab  Results  Component Value Date   NA 134 (L) 06/25/2016   K 3.9 06/25/2016   CO2 32 06/25/2016   GLUCOSE 78 06/25/2016   BUN 16 06/25/2016   CREATININE 0.76 06/25/2016   BILITOT 0.3 06/25/2016   ALKPHOS 86 06/25/2016   AST 20 06/25/2016   ALT 19 06/25/2016   PROT 6.8 06/25/2016   ALBUMIN 3.9 06/25/2016   CALCIUM 8.7 06/25/2016   ANIONGAP 6 12/03/2015   GFR 79.54 06/25/2016   Lab Results  Component Value Date   CHOL 152 06/25/2016   Lab Results  Component Value Date   HDL 52.30 06/25/2016   Lab Results  Component Value Date   LDLCALC 78 06/25/2016   Lab Results  Component Value Date   TRIG 111.0 06/25/2016   Lab Results  Component Value Date   CHOLHDL 3 06/25/2016   Lab Results  Component Value Date   HGBA1C 5.8 (H) 07/18/2011       Assessment & Plan:   Problem List Items Addressed This Visit    Hyperlipidemia, mixed    Encouraged heart healthy diet, increase exercise, avoid trans fats, consider a krill oil cap daily      Essential hypertension    Well controlled, no changes to meds. Encouraged heart healthy diet such as the DASH diet and exercise as tolerated.       G E R D   Relevant Medications   ranitidine (ZANTAC) 300 MG tablet   omeprazole (PRILOSEC) 40 MG capsule   Cough    Encouraged increased rest and hydration, add probiotics, zinc such as Coldeze or Xicam. Treat fevers as needed      Relevant Medications   omeprazole (PRILOSEC) 40 MG capsule   Hypothyroidism    On Levothyroxine, continue to monitor      Constipation    Encouraged increased hydration and fiber in diet. Daily probiotics. If bowels not moving can use MOM 2 tbls po in 4 oz of warm prune juice by mouth every 2-3 days. If no results then repeat in 4 hours with  Dulcolax suppository pr, may repeat again in 4 more hours as needed. Seek care if symptoms worsen. Consider daily Miralax and/or Dulcolax if symptoms persist.       Anemia    Increase leafy greens, consider increased  lean red meat and using cast iron cookware. Continue to monitor, report any concerns       Other Visit Diagnoses    Seasonal allergies       Relevant Medications   omeprazole (PRILOSEC) 40 MG capsule      I have discontinued Ms. Kellogg's ranitidine. I have also changed her omeprazole. Additionally, I am having her start on ranitidine. Lastly, I am having her maintain her levothyroxine, psyllium, cetirizine, KRILL OIL PO, TURMERIC PO, Polyethyl Glycol-Propyl Glycol, sodium chloride, Probiotic Product (PROBIOTIC ADVANCED PO), Vitamin D (Cholecalciferol), docusate sodium, NITROSTAT, aspirin EC, polyethylene glycol, valsartan, hydrochlorothiazide, simvastatin, fluticasone, naproxen, and traMADol.  Meds ordered this encounter  Medications  . ranitidine (ZANTAC) 300 MG tablet    Sig: Take 1 tablet (300  mg total) by mouth at bedtime.    Dispense:  30 tablet    Refill:  5  . omeprazole (PRILOSEC) 40 MG capsule    Sig: Take 1 capsule (40 mg total) by mouth 2 (two) times daily.    Dispense:  60 capsule    Refill:  1     BLYTH, STACEY, MD

## 2016-07-25 NOTE — Assessment & Plan Note (Signed)
On Levothyroxine, continue to monitor 

## 2016-07-25 NOTE — Assessment & Plan Note (Signed)
Well controlled, no changes to meds. Encouraged heart healthy diet such as the DASH diet and exercise as tolerated.  °

## 2016-07-25 NOTE — Assessment & Plan Note (Signed)
Encouraged increased hydration and fiber in diet. Daily probiotics. If bowels not moving can use MOM 2 tbls po in 4 oz of warm prune juice by mouth every 2-3 days. If no results then repeat in 4 hours with  Dulcolax suppository pr, may repeat again in 4 more hours as needed. Seek care if symptoms worsen. Consider daily Miralax and/or Dulcolax if symptoms persist.  

## 2016-07-25 NOTE — Assessment & Plan Note (Signed)
Encouraged heart healthy diet, increase exercise, avoid trans fats, consider a krill oil cap daily 

## 2016-07-25 NOTE — Assessment & Plan Note (Signed)
Encouraged increased rest and hydration, add probiotics, zinc such as Coldeze or Xicam. Treat fevers as needed 

## 2016-07-25 NOTE — Assessment & Plan Note (Signed)
Increase leafy greens, consider increased lean red meat and using cast iron cookware. Continue to monitor, report any concerns 

## 2016-07-30 DIAGNOSIS — G4733 Obstructive sleep apnea (adult) (pediatric): Secondary | ICD-10-CM | POA: Diagnosis not present

## 2016-08-02 ENCOUNTER — Encounter: Payer: Self-pay | Admitting: Internal Medicine

## 2016-08-02 ENCOUNTER — Ambulatory Visit: Payer: PPO | Admitting: Internal Medicine

## 2016-08-02 ENCOUNTER — Ambulatory Visit (INDEPENDENT_AMBULATORY_CARE_PROVIDER_SITE_OTHER): Payer: PPO | Admitting: Internal Medicine

## 2016-08-02 ENCOUNTER — Ambulatory Visit: Payer: Medicare Other | Admitting: Internal Medicine

## 2016-08-02 VITALS — BP 162/78 | HR 81 | Ht 62.0 in | Wt 204.6 lb

## 2016-08-02 DIAGNOSIS — G4733 Obstructive sleep apnea (adult) (pediatric): Secondary | ICD-10-CM

## 2016-08-02 DIAGNOSIS — R059 Cough, unspecified: Secondary | ICD-10-CM

## 2016-08-02 DIAGNOSIS — R05 Cough: Secondary | ICD-10-CM

## 2016-08-02 MED ORDER — PROMETHAZINE-CODEINE 6.25-10 MG/5ML PO SYRP: ORAL_SOLUTION | ORAL | 0 refills | Status: DC

## 2016-08-02 MED ORDER — FLUTICASONE FUROATE-VILANTEROL 200-25 MCG/INH IN AEPB: 1.0000 | INHALATION_SPRAY | Freq: Every day | RESPIRATORY_TRACT | 0 refills | Status: DC

## 2016-08-02 MED ORDER — FLUTICASONE FUROATE-VILANTEROL 200-25 MCG/INH IN AEPB
1.0000 | INHALATION_SPRAY | Freq: Every day | RESPIRATORY_TRACT | 0 refills | Status: DC
Start: 2016-08-02 — End: 2016-08-02

## 2016-08-02 NOTE — Progress Notes (Signed)
Subjective:    Patient ID: Jackie Stevens, female    DOB: 1946/01/24, 70 y.o.   MRN: [id number]  HPI 07/26/11- followed for obstructive sleep apnea complicated by GERD, HBP, allergic rhinitis  07/31/15- 68 yoF never smoker followed for obstructive sleep apnea complicated by GERD, HBP, allergic rhinitis Follows For: Wearing cpap 10/ [facility] 5-8 hrs/night -  (No download) - Mask fits good - Did not see Dr Ron Parker for dental appliance due to h/o problems with ears and throat She feels she is doing well with CPAP 10 worn all night every night. Breathing now was quite good. She expects to use rescue inhaler more in September as fall season begins.  03/02/2016-70 year old female never smoker followed for OSA, complicated by chronic cough, GERD, HBP, allergic rhinitis CPAP 10/APS FOLLOWS FOR: DME: [facility]. Pt wears CPAP but has had some issues with weaing machine; caught flu in March and since then has had cough/gagging. Pt has to take machine off and sleeps in chair without CPAP on. Cough has persisted since a flu-like illness in March, worse lying down. Says exam by ENT was negative. Her PCP treated with Prilosec 40 mg twice a day with no benefit. She has raised head of bed with bricks. Saw GI Dr Benson Norway who did not feel she needed EGD  Review of Systems-see HPI Constitutional:   No-   weight loss, night sweats, fevers, chills, fatigue, lassitude. HEENT:   No-  headaches, difficulty swallowing, tooth/dental problems, sore throat,         No-sneezing, itching, ear ache,no- nasal congestion, post nasal drip,  CV:  No-   chest pain, orthopnea, PND, swelling in lower extremities, anasarca, dizziness, palpitations Resp: No-   shortness of breath with exertion or at rest.              No-   productive cough,  + non-productive cough,  No-  coughing up of blood.              No-   change in color of mucus.  No- wheezing.   Skin: No-   rash or lesions. GI:  No-   heartburn, indigestion, abdominal pain, nausea, vomiting,   GU:  MS:  No-   joint pain or swelling.   Neuro- nothing unusual Psych:  No- change in mood or affect. No depression or anxiety.  No memory loss.   Objective:   Physical Exam General- Alert, Oriented, Affect-appropriate, Distress- none acute, + Morbidly obese Skin- rash-none, lesions- none, excoriation- none Lymphadenopathy- none Head- atraumatic            Eyes- Gross vision intact, PERRLA, conjunctivae clear secretions            Ears- Hearing, canals slightly retracted, no fluid            Nose- Clear, No-Septal dev, mucus, polyps, erosion, perforation             Throat- Mallampati III-IV , mucosa clear- not red , drainage- none, tonsils- atrophic, not hoarse Neck- flexible , trachea midline, no stridor , thyroid nl, carotid no bruit Chest - symmetrical excursion , unlabored           Heart/CV- RRR , no murmur , no gallop  , no rub, nl s1 s2                           - JVD- none , edema- none, stasis changes- none, varices- none  Lung- clear to P&A, wheeze- none, cough- none , dullness-none, rub- none           Chest wall-  Abd-  Br/ Gen/ Rectal- Not done, not indicated Extrem- cyanosis- none, clubbing, none, atrophy- none, strength- nl Neuro- grossly intact to observation   Assessment & Plan:

## 2016-08-02 NOTE — Patient Instructions (Addendum)
Sample Breo 200     Inhale 1 puff then rinse mouth, once daily   Script for cough syrup to take at bedtime for awhile to see if this can turn off the cough.   Order- DME [facility] CPAP download for pressure compliance

## 2016-08-11 ENCOUNTER — Other Ambulatory Visit: Payer: Self-pay | Admitting: Family Medicine

## 2016-08-11 NOTE — Telephone Encounter (Signed)
Received refill request from pharmacy:  Last Tramadol Rx: 07/11/16, #30.  Last OV: 07/13/16 Next OV: 09/14/16 UDS: no uds or csc on file.  Please advise request?

## 2016-08-12 NOTE — Telephone Encounter (Signed)
Faxed tramadol to [facility] in [place]

## 2016-08-17 NOTE — Assessment & Plan Note (Signed)
Download not available at this visit. Cough is significantly interfered with ability to use CPAP and with ability to sleep lying down. We discussed CPAP comfort and goals.

## 2016-08-17 NOTE — Assessment & Plan Note (Signed)
She has tried pretty hard to exclude GI/reflux basis for this cough which she says flared after respiratory infection in March. It may have become an upper airway cough syndrome but we will focus on possible reactive airways component first. She does have history of asthma. Plan-sample Breo, prescription for cough syrup with discussion. Basic guidance to use sips and Jolly rancher throat lozenges, avoiding mint.

## 2016-08-26 DIAGNOSIS — Z1231 Encounter for screening mammogram for malignant neoplasm of breast: Secondary | ICD-10-CM | POA: Diagnosis not present

## 2016-08-26 LAB — HM MAMMOGRAPHY

## 2016-08-28 ENCOUNTER — Other Ambulatory Visit: Payer: Self-pay | Admitting: Family Medicine

## 2016-09-07 ENCOUNTER — Ambulatory Visit: Payer: PPO

## 2016-09-13 ENCOUNTER — Other Ambulatory Visit: Payer: Self-pay | Admitting: Family Medicine

## 2016-09-13 NOTE — Telephone Encounter (Signed)
Faxed hardcopy for Tramadol to [facility] in [place] 

## 2016-09-14 ENCOUNTER — Encounter: Payer: Self-pay | Admitting: Family Medicine

## 2016-09-14 ENCOUNTER — Ambulatory Visit (INDEPENDENT_AMBULATORY_CARE_PROVIDER_SITE_OTHER): Payer: PPO | Admitting: Family Medicine

## 2016-09-14 ENCOUNTER — Ambulatory Visit (HOSPITAL_BASED_OUTPATIENT_CLINIC_OR_DEPARTMENT_OTHER)
Admission: RE | Admit: 2016-09-14 | Discharge: 2016-09-14 | Disposition: A | Discharge status: Home / Self Care | Payer: PPO | Source: Ambulatory Visit | Attending: Family Medicine | Admitting: Family Medicine

## 2016-09-14 VITALS — BP 142/72 | HR 73 | Temp 98.2°F | Ht 62.0 in | Wt 207.2 lb

## 2016-09-14 DIAGNOSIS — Z23 Encounter for immunization: Secondary | ICD-10-CM | POA: Diagnosis not present

## 2016-09-14 DIAGNOSIS — E039 Hypothyroidism, unspecified: Secondary | ICD-10-CM

## 2016-09-14 DIAGNOSIS — M25512 Pain in left shoulder: Secondary | ICD-10-CM | POA: Insufficient documentation

## 2016-09-14 DIAGNOSIS — I10 Essential (primary) hypertension: Secondary | ICD-10-CM

## 2016-09-14 DIAGNOSIS — M25572 Pain in left ankle and joints of left foot: Secondary | ICD-10-CM | POA: Diagnosis not present

## 2016-09-14 DIAGNOSIS — M19012 Primary osteoarthritis, left shoulder: Secondary | ICD-10-CM | POA: Diagnosis not present

## 2016-09-14 DIAGNOSIS — E782 Mixed hyperlipidemia: Secondary | ICD-10-CM

## 2016-09-14 NOTE — Progress Notes (Signed)
Pre visit review using our clinic review tool, if applicable. No additional management support is needed unless otherwise documented below in the visit note. 

## 2016-09-14 NOTE — Patient Instructions (Signed)
Elevate feet, minimize sodium, compression hose, Jobst, light weight knee high Lidocaine gel or patch   Peripheral Edema You have swelling in your legs (peripheral edema). This swelling is due to excess accumulation of salt and water in your body. Edema may be a sign of heart, kidney or liver disease, or a side effect of a medication. It may also be due to problems in the leg veins. Elevating your legs and using special support stockings may be very helpful, if the cause of the swelling is due to poor venous circulation. Avoid long periods of standing, whatever the cause. Treatment of edema depends on identifying the cause. Chips, pretzels, pickles and other salty foods should be avoided. Restricting salt in your diet is almost always needed. Water pills (diuretics) are often used to remove the excess salt and water from your body via urine. These medicines prevent the kidney from reabsorbing sodium. This increases urine flow. Diuretic treatment may also result in lowering of potassium levels in your body. Potassium supplements may be needed if you have to use diuretics daily. Daily weights can help you keep track of your progress in clearing your edema. You should call your caregiver for follow up care as recommended. SEEK IMMEDIATE MEDICAL CARE IF:   You have increased swelling, pain, redness, or heat in your legs.  You develop shortness of breath, especially when lying down.  You develop chest or abdominal pain, weakness, or fainting.  You have a fever.   This information is not intended to replace advice given to you by your health care provider. Make sure you discuss any questions you have with your health care provider.   Document Released: 01/06/2005 Document Revised: 02/21/2012 Document Reviewed: 06/11/2015 Elsevier Interactive Patient Education [employer].

## 2016-09-15 DIAGNOSIS — M25572 Pain in left ankle and joints of left foot: Secondary | ICD-10-CM | POA: Insufficient documentation

## 2016-09-15 NOTE — Assessment & Plan Note (Signed)
On Levothyroxine, continue to monitor 

## 2016-09-15 NOTE — Assessment & Plan Note (Signed)
Second toe deformity and thickened toenails with callous formation on toes. Referred to podiatry for consideration

## 2016-09-15 NOTE — Assessment & Plan Note (Signed)
Encouraged heart healthy diet, increase exercise, avoid trans fats, consider a krill oil cap daily 

## 2016-09-15 NOTE — Progress Notes (Signed)
Patient ID: Jackie Stevens, female   DOB: 07-07-1946, 70 y.o.   MRN: [id number]   Subjective:    Patient ID: Jackie Stevens, female    DOB: 12-Aug-1946, 70 y.o.   MRN: [id number]  Chief Complaint  Patient presents with  . Follow-up   HPI Patient is in today for follow up. She feels well today. Her cough has improved. No fevers chills or congestion. No other recent illness. Her complaints center around her left shoulder pain which has been present for roughly a year. She also notes left foot pain due to her toes rubbing together. Denies CP/palp/SOB/HA/congestion/fevers/GI or GU c/o. Taking meds as prescribed  Past Medical History:  Diagnosis Date  . Abnormal cervical cytology 10/25/2012   Follows with Dr Leavy Cella of Gyn  . Allergic rhinitis   . Anemia 10/06/2013  . Anginal pain ([place])    pt has history of CP states had cardiac workup with no specific issues identified   . Anxiety    when increased stress   . Arthritis    "knees; right thumb; shoulders" (01-02-13)  . Asthma   . Chicken pox as a child  . Complication of anesthesia   . Constipation   . Diverticulitis 10/25/2012   pt. reports that a drain was placed - 09/2012    . External hemorrhoid, bleeding    "sometimes" (01-02-2013)  . GERD (gastroesophageal reflux disease)   . H/O hiatal hernia   . Heart murmur   . HTN (hypertension)    stress test completed by Anselm Lis, diagnosed as GERD  . Hyperlipidemia   . Hypothyroidism   . Incontinence   . Insomnia   . Kidney stones 1970's   "passed on their own" (January 02, 2013)  . Left shoulder pain 09/14/2016  . Low back pain 06/09/2014  . Measles as a child  . Medicare annual wellness visit, subsequent 10/06/2013   [facility] Dermatology Pneumovax in 2012   . OSA on CPAP   . Paresthesia 03/23/2015   Left face  . PONV (postoperative nausea and vomiting)   . Preventative health care 10/06/2013   [facility] D[facility]at[facility] Pneumovax in 2012  . Preventative health care  02/26/2016  . Shortness of breath dyspnea    using stairs  . Thyroid cancer ([place]) 1980's  . Thyroid disease   . UTI (urinary tract infection) 04/02/2013    Past Surgical History:  Procedure Laterality Date  . CHOLECYSTECTOMY  1990  . COLON SURGERY    . COLOSTOMY REVISION  2013-01-02   Procedure: COLON RESECTION SIGMOID;  Surgeon: Gwenyth Ober, MD;  Location: [facility];  Service: General;  Laterality: N/A;  . CYSTOSCOPY WITH STENT PLACEMENT  01/02/13   Procedure: CYSTOSCOPY WITH STENT PLACEMENT;  Surgeon: Hanley Ben, MD;  Location: [place];  Service: Urology;  Laterality: N/A;  . DILATION AND CURETTAGE OF UTERUS  1960's   "lots of them; had miscarriages" (2013-01-02)  . LYSIS OF ADHESION N/A 12/02/2015   Procedure: LAPAROSCOPIC LYSIS OF ADHESION;  Surgeon: Johnathan Hausen, MD;  [facility];  Service: General;  Laterality: N/A;  . ROBOTIC ASSISTED BILATERAL SALPINGO OOPHERECTOMY Bilateral 12/02/2015   Procedure: XI ROBOTIC ASSISTED BILATERAL SALPINGO OOPHORECTOMY;  Surgeon: Everitt Amber, MD;  Location: [facility];  Service: Gynecology;  Laterality: Bilateral;  . SIGMOID RESECTION / RECTOPEXY  2013/01/02  . THYROIDECTOMY, PARTIAL  1988   "then did iodine to remove the rest" (01-02-13)  . TONSILLECTOMY  1951?  . TRANSRECTAL DRAINAGE OF PELVIC ABSCESS  10/27/2012  . VAGINAL HYSTERECTOMY  1970's   "still have my ovaries" (12/19/2012)    Family History  Problem Relation Age of Onset  . Heart disease Father   . Pneumonia Father   . Hypertension Father   . Hyperlipidemia Father   . Cancer Father     skin  . Stroke Father   . Cancer Paternal Aunt   . Alzheimer's disease Mother   . Heart disease Mother   . Emphysema Brother     marijuana and cigarettes  . Alcohol abuse Brother   . Hearing loss Brother   . Diabetes Maternal Grandmother   . Alzheimer's disease Paternal Grandmother   . Cancer Paternal Grandmother     lung?- smoker  . Hyperlipidemia Paternal Grandmother   . Heart attack  Paternal Grandfather   . Alcohol abuse Paternal Grandfather   . Neurofibromatosis Son     schwanomatosis  . Neurofibromatosis Son     swanomatosis    Social History   Social History  . Marital status: Married    Spouse name: N/A  . Number of children: N/A  . Years of education: N/A   Occupational History  . Not on file.   Social History Main Topics  . Smoking status: Never Smoker  . Smokeless tobacco: Never Used  . Alcohol use 0.0 oz/week     Comment: 12/19/2012 "glass of wine q night q other month or so" beer with pizza   . Drug use: No  . Sexual activity: No   Other Topics Concern  . Not on file   Social History Narrative   Married    children    Outpatient Medications Prior to Visit  Medication Sig Dispense Refill  . aspirin EC 81 MG tablet Take 81 mg by mouth daily. Reported on 12/19/2015    . cetirizine (ZYRTEC) 10 MG tablet Take 1 tablet (10 mg total) by mouth daily. (Patient taking differently: Take 10 mg by mouth daily as needed for allergies. ) 30 tablet 11  . docusate sodium (COLACE) 100 MG capsule Take 100 mg by mouth 2 (two) times daily. Reported on 03/30/2016    . fluticasone (FLONASE) 50 MCG/ACT nasal spray PLACE 2 SPRAYS INTO THE NOSE DAILY 16 g 1  . fluticasone furoate-vilanterol (BREO ELLIPTA) 200-25 MCG/INH AEPB Inhale 1 puff into the lungs daily. Rinse mouth 1 each 0  . hydrochlorothiazide (HYDRODIURIL) 25 MG tablet Take 1 tablet (25 mg total) by mouth daily. 90 tablet 2  . KRILL OIL PO Take 750 mg by mouth daily.     Marland Kitchen levothyroxine (SYNTHROID, LEVOTHROID) 150 MCG tablet Take 150-225 mcg by mouth daily before breakfast. 1 tablet (150 mcg) every day except Sunday, 1.5 tablet (225 mcg) on Sunday    . naproxen (NAPROSYN) 375 MG tablet TAKE 1 TABLET BY MOUTH TWICE A DAY WITH A MEAL 180 tablet 0  . NITROSTAT 0.4 MG SL tablet PLACE 1 TABLET UNDER THE TONGUE EVERY 5 MINUTES AS NEEDED FOR CHEST PAIN 25 tablet 0  . omeprazole (PRILOSEC) 40 MG capsule Take 1  capsule (40 mg total) by mouth 2 (two) times daily. 60 capsule 1  . Polyethyl Glycol-Propyl Glycol 0.4-0.3 % GEL Apply 1 drop to eye 2 (two) times daily as needed (dry eyes).     . polyethylene glycol (MIRALAX / GLYCOLAX) packet Take 17 g by mouth daily.    . Probiotic Product (PROBIOTIC ADVANCED PO) Take 2 tablets by mouth at bedtime. Digestive Advantage    . promethazine-codeine (  PHENERGAN WITH CODEINE) 6.25-10 MG/5ML syrup 1 teaspoon at bedtime for cough 180 mL 0  . psyllium (REGULOID) 0.52 G capsule Take 0.52 g by mouth at bedtime.    . ranitidine (ZANTAC) 300 MG tablet Take 1 tablet (300 mg total) by mouth at bedtime. 30 tablet 5  . simvastatin (ZOCOR) 40 MG tablet TAKE 1 TABLET BY MOUTH AT BEDTIME 90 tablet 2  . sodium chloride (OCEAN) 0.65 % SOLN nasal spray Place 1 spray into both nostrils 2 (two) times daily as needed for congestion.     . traMADol (ULTRAM) 50 MG tablet TAKE 1 TABLET EVERY 6 HOURS AS NEEDED FOR MODERATE PAIN 30 tablet 0  . TURMERIC PO Take 1 tablet by mouth daily. Reported on 12/19/2015    . valsartan (DIOVAN) 320 MG tablet Take 1 tablet (320 mg total) by mouth daily. 90 tablet 2  . Vitamin D, Cholecalciferol, 1000 UNITS CAPS Take 1 capsule by mouth daily.     No facility-administered medications prior to visit.     Allergies  Allergen Reactions  . Neomycin-Bacitracin Zn-Polymyx Rash    Polysporin- is tolerated   . Niacin Other (See Comments) and Cough    "cough til I threw up" (12/19/2012)  . Ciprofloxacin Hives    Got cipro and flagyl at same time, localized hives to IV arm  . Flagyl [Metronidazole] Hives    Got cipro and flagyl at same time, localized hives to IV arm    Review of Systems  Constitutional: Negative for fever and malaise/fatigue.  HENT: Negative for congestion.   Eyes: Negative for blurred vision.  Respiratory: Negative for shortness of breath.   Cardiovascular: Negative for chest pain, palpitations and leg swelling.  Gastrointestinal:  Negative for abdominal pain, blood in stool and nausea.  Genitourinary: Negative for dysuria and frequency.  Musculoskeletal: Positive for joint pain. Negative for falls.  Skin: Negative for rash.  Neurological: Negative for dizziness, loss of consciousness and headaches.  Endo/Heme/Allergies: Negative for environmental allergies.  Psychiatric/Behavioral: Negative for depression. The patient is not nervous/anxious.        Objective:    Physical Exam  Constitutional: She is oriented to person, place, and time. She appears well-developed and well-nourished. No distress.  HENT:  Head: Normocephalic and atraumatic.  Nose: Nose normal.  Eyes: Right eye exhibits no discharge. Left eye exhibits no discharge.  Neck: Normal range of motion. Neck supple.  Cardiovascular: Normal rate and regular rhythm.   No murmur heard. Pulmonary/Chest: Effort normal and breath sounds normal.  Abdominal: Soft. Bowel sounds are normal. There is no tenderness.  Musculoskeletal: She exhibits no edema.  Left 2 nd toe overlaps first toe, thickened toenails and callous formation  Neurological: She is alert and oriented to person, place, and time.  Skin: Skin is warm and dry.  Psychiatric: She has a normal mood and affect.  Nursing note and vitals reviewed.   BP (!) 142/72 (BP Location: Left Arm, Patient Position: Sitting, Cuff Size: Large)   Pulse 73   Temp 98.2 F (36.8 C) (Oral)   Ht 5\' 2"  (1.575 m)   Wt 207 lb 4 oz (94 kg)   SpO2 99%   BMI 37.91 kg/m  Wt Readings from Last 3 Encounters:  09/14/16 207 lb 4 oz (94 kg)  08/02/16 204 lb 9.6 oz (92.8 kg)  07/13/16 202 lb (91.6 kg)     Lab Results  Component Value Date   WBC 7.1 06/25/2016   HGB 11.6 (L) 06/25/2016  HCT 34.8 (L) 06/25/2016   PLT 291.0 06/25/2016   GLUCOSE 78 06/25/2016   CHOL 152 06/25/2016   TRIG 111.0 06/25/2016   HDL 52.30 06/25/2016   LDLCALC 78 06/25/2016   ALT 19 06/25/2016   AST 20 06/25/2016   NA 134 (L) 06/25/2016     K 3.9 06/25/2016   CL 96 06/25/2016   CREATININE 0.76 06/25/2016   BUN 16 06/25/2016   CO2 32 06/25/2016   TSH 0.60 06/25/2016   INR 0.99 03/16/2015   HGBA1C 5.8 (H) 07/18/2011    Lab Results  Component Value Date   TSH 0.60 06/25/2016   Lab Results  Component Value Date   WBC 7.1 06/25/2016   HGB 11.6 (L) 06/25/2016   HCT 34.8 (L) 06/25/2016   MCV 85.4 06/25/2016   PLT 291.0 06/25/2016   Lab Results  Component Value Date   NA 134 (L) 06/25/2016   K 3.9 06/25/2016   CO2 32 06/25/2016   GLUCOSE 78 06/25/2016   BUN 16 06/25/2016   CREATININE 0.76 06/25/2016   BILITOT 0.3 06/25/2016   ALKPHOS 86 06/25/2016   AST 20 06/25/2016   ALT 19 06/25/2016   PROT 6.8 06/25/2016   ALBUMIN 3.9 06/25/2016   CALCIUM 8.7 06/25/2016   ANIONGAP 6 12/03/2015   GFR 79.54 06/25/2016   Lab Results  Component Value Date   CHOL 152 06/25/2016   Lab Results  Component Value Date   HDL 52.30 06/25/2016   Lab Results  Component Value Date   LDLCALC 78 06/25/2016   Lab Results  Component Value Date   TRIG 111.0 06/25/2016   Lab Results  Component Value Date   CHOLHDL 3 06/25/2016   Lab Results  Component Value Date   HGBA1C 5.8 (H) 07/18/2011       Assessment & Plan:   Problem List Items Addressed This Visit    Hyperlipidemia, mixed    Encouraged heart healthy diet, increase exercise, avoid trans fats, consider a krill oil cap daily      Essential hypertension    Well controlled, no changes to meds. Encouraged heart healthy diet such as the DASH diet and exercise as tolerated.       Hypothyroidism    On Levothyroxine, continue to monitor      Left shoulder pain    Persistent over a year. Xray ordered and referred to sports med      Relevant Orders   Ambulatory referral to Sports Medicine   DG Shoulder Left (Completed)   Pain of joint of left ankle and foot - Primary    Second toe deformity and thickened toenails with callous formation on toes. Referred to  podiatry for consideration      Relevant Orders   Ambulatory referral to Podiatry    Other Visit Diagnoses    Encounter for immunization       Relevant Orders   Flu vaccine HIGH DOSE PF (Completed)   Ambulatory referral to Podiatry   Pain in joint of left shoulder       Relevant Orders   Ambulatory referral to Sports Medicine      I am having Ms. Shaheed maintain her levothyroxine, psyllium, cetirizine, KRILL OIL PO, TURMERIC PO, Polyethyl Glycol-Propyl Glycol, sodium chloride, Probiotic Product (PROBIOTIC ADVANCED PO), Vitamin D (Cholecalciferol), docusate sodium, NITROSTAT, aspirin EC, polyethylene glycol, valsartan, hydrochlorothiazide, simvastatin, fluticasone, ranitidine, omeprazole, promethazine-codeine, fluticasone furoate-vilanterol, naproxen, and traMADol.  No orders of the defined types were placed in this encounter.  Penni Homans, MD

## 2016-09-15 NOTE — Assessment & Plan Note (Signed)
Persistent over a year. Xray ordered and referred to sports med

## 2016-09-15 NOTE — Assessment & Plan Note (Signed)
Well controlled, no changes to meds. Encouraged heart healthy diet such as the DASH diet and exercise as tolerated.  °

## 2016-09-16 DIAGNOSIS — H11001 Unspecified pterygium of right eye: Secondary | ICD-10-CM | POA: Diagnosis not present

## 2016-09-16 DIAGNOSIS — I10 Essential (primary) hypertension: Secondary | ICD-10-CM | POA: Diagnosis not present

## 2016-09-16 DIAGNOSIS — H52223 Regular astigmatism, bilateral: Secondary | ICD-10-CM | POA: Diagnosis not present

## 2016-09-16 DIAGNOSIS — H5203 Hypermetropia, bilateral: Secondary | ICD-10-CM | POA: Diagnosis not present

## 2016-09-16 DIAGNOSIS — H43393 Other vitreous opacities, bilateral: Secondary | ICD-10-CM | POA: Diagnosis not present

## 2016-09-16 DIAGNOSIS — H35033 Hypertensive retinopathy, bilateral: Secondary | ICD-10-CM | POA: Diagnosis not present

## 2016-09-16 DIAGNOSIS — H524 Presbyopia: Secondary | ICD-10-CM | POA: Diagnosis not present

## 2016-09-16 DIAGNOSIS — H1850 Unspecified hereditary corneal dystrophies: Secondary | ICD-10-CM | POA: Diagnosis not present

## 2016-09-16 DIAGNOSIS — H25813 Combined forms of age-related cataract, bilateral: Secondary | ICD-10-CM | POA: Diagnosis not present

## 2016-09-21 ENCOUNTER — Ambulatory Visit (HOSPITAL_BASED_OUTPATIENT_CLINIC_OR_DEPARTMENT_OTHER)
Admission: RE | Admit: 2016-09-21 | Discharge: 2016-09-21 | Disposition: A | Discharge status: Home / Self Care | Payer: PPO | Source: Ambulatory Visit | Attending: Podiatry | Admitting: Podiatry

## 2016-09-21 ENCOUNTER — Encounter: Payer: Self-pay | Admitting: Podiatry

## 2016-09-21 ENCOUNTER — Ambulatory Visit (INDEPENDENT_AMBULATORY_CARE_PROVIDER_SITE_OTHER): Payer: PPO | Admitting: Podiatry

## 2016-09-21 VITALS — BP 178/76 | HR 79 | Resp 18

## 2016-09-21 DIAGNOSIS — M19072 Primary osteoarthritis, left ankle and foot: Secondary | ICD-10-CM | POA: Diagnosis not present

## 2016-09-21 DIAGNOSIS — R52 Pain, unspecified: Secondary | ICD-10-CM

## 2016-09-21 DIAGNOSIS — B351 Tinea unguium: Secondary | ICD-10-CM | POA: Diagnosis not present

## 2016-09-21 DIAGNOSIS — L84 Corns and callosities: Secondary | ICD-10-CM | POA: Diagnosis not present

## 2016-09-21 DIAGNOSIS — M79672 Pain in left foot: Secondary | ICD-10-CM | POA: Diagnosis not present

## 2016-09-21 DIAGNOSIS — M79674 Pain in right toe(s): Secondary | ICD-10-CM

## 2016-09-21 DIAGNOSIS — M2010 Hallux valgus (acquired), unspecified foot: Secondary | ICD-10-CM

## 2016-09-21 DIAGNOSIS — M205X9 Other deformities of toe(s) (acquired), unspecified foot: Secondary | ICD-10-CM

## 2016-09-21 DIAGNOSIS — M2012 Hallux valgus (acquired), left foot: Secondary | ICD-10-CM | POA: Diagnosis not present

## 2016-09-21 DIAGNOSIS — M79675 Pain in left toe(s): Secondary | ICD-10-CM

## 2016-09-21 NOTE — Progress Notes (Signed)
   Subjective:    Patient ID: Jackie Stevens, female    DOB: 1946-09-25, 70 y.o.   MRN: [id number]  HPI  70 year old female presents the office today for her second toes overlapping the big toes. This been ongoing for several years and has been painful with shoe gear and pressure. She has tried changing her shoes which is helped. She has noticed a callus form on the bottom of both of her feet and the balls of the foot with painful pressure in shoe gear. Denies any redness or drainage trace swelling. No drainage or pus. Chest as is her nails are thick and she cannot herself may cause pain with shoe gear. No other complaints at this time. No recent injury or trauma.   Review of Systems  All other systems reviewed and are negative.      Objective:   Physical Exam General: AAO x3, NAD  Dermatological: The keratotic lesions present bilateral submetatarsal 3. Upon debridement no underlying ulceration, drainage or any signs of infection. No other open lesions or pre-ulcerative lesions are identified today. Nails are hypertrophic, dystrophic, brittle, discolored, elongated 10. There is no swelling redness or drainage. Tenderness nails 1-5 bilaterally.  Vascular: Dorsalis Pedis artery and Posterior Tibial artery pedal pulses are 2/4 bilateral with immedate capillary fill time. There is no pain with calf compression, swelling, warmth, erythema.   Neruologic: Grossly intact via light touch bilateral. Vibratory intact via tuning fork bilateral. Protective threshold with Semmes Wienstein monofilament intact to all pedal sites bilateral.   Musculoskeletal: Significant HAV present bilaterally overlapping second toes. There are rigid hammertoe contractures present. No other areas of tenderness bilaterally. MMT 5/5.  Gait: Unassisted, Nonantalgic.      Assessment & Plan:  70 year old female bilateral submetatarsal hyperkeratotic lesions, HAV/overlapping second toes -Treatment options discussed including  all alternatives, risks, and complications -Etiology of symptoms were discussed -X-rays were ordered and reviewed. - Hyperkeratotic lesions were debrided 2 without complications or bleeding. -Discussed conservative and surgical treatment options. She states that surgery is not an option for her. Discussed with her extra-depth shoe. She is inquiring about possibly were to make them for her and we will have the office call her. -Offloading pads were dispensed -Nail sharply debrided 10 without complications or bleeding -Follow-up in 3 months or sooner if needed. Call any questions or concerns.  Celesta Gentile, DPM

## 2016-09-24 ENCOUNTER — Encounter: Payer: Self-pay | Admitting: Family Medicine

## 2016-09-24 DIAGNOSIS — G4733 Obstructive sleep apnea (adult) (pediatric): Secondary | ICD-10-CM | POA: Diagnosis not present

## 2016-09-29 ENCOUNTER — Other Ambulatory Visit: Payer: Self-pay | Admitting: Family Medicine

## 2016-09-29 DIAGNOSIS — K219 Gastro-esophageal reflux disease without esophagitis: Secondary | ICD-10-CM

## 2016-09-29 DIAGNOSIS — J302 Other seasonal allergic rhinitis: Secondary | ICD-10-CM

## 2016-09-29 DIAGNOSIS — R059 Cough, unspecified: Secondary | ICD-10-CM

## 2016-09-29 DIAGNOSIS — R05 Cough: Secondary | ICD-10-CM

## 2016-09-29 NOTE — Progress Notes (Signed)
[facility] [place], [place] [address] Phone: [phone] Subjective:    I'm seeing this patient by the request  of:  Penni Homans, MD   CC: Left arm and shoulder pain  [id number]  Jackie Stevens is a 70 y.o. female coming in with complaint of left shoulder pain. Patient is had this pain for approximately 1 year. Did worsening slowly. Trying to work on a regular basis. Seems to be more on the lateral aspect of the shoulder. States that there is a significant amount of sound to it as well. States that when it does pop it seems to hurt. Waking her up at night. Rates the severity pain is 7 out of 10. Mild weakness compared to the contralateral side   patient did have x-rays 09/14/2016. 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  Past Medical History:  Diagnosis Date  . Abnormal cervical cytology 10/25/2012   Follows with Dr Leavy Cella of Gyn  . Allergic rhinitis   . Anemia 10/06/2013  . Anginal pain ([place])    pt has history of CP states had cardiac workup with no specific issues identified   . Anxiety    when increased stress   . Arthritis    "knees; right thumb; shoulders" (12/22/12)  . Asthma   . Chicken pox as a child  . Complication of anesthesia   . Constipation   . Diverticulitis 10/25/2012   pt. reports that a drain was placed - 09/2012    . External hemorrhoid, bleeding    "sometimes" (Dec 22, 2012)  . GERD (gastroesophageal reflux disease)   . H/O hiatal hernia   . Heart murmur   . HTN (hypertension)    stress test completed by Anselm Lis, diagnosed as GERD  . Hyperlipidemia   . Hypothyroidism   . Incontinence   . Insomnia   . Kidney stones 1970's   "passed on their own" (2012/12/22)  . Left shoulder pain 09/14/2016  . Low back pain 06/09/2014  . Measles as a  child  . Medicare annual wellness visit, subsequent 10/06/2013   [facility] of D[facility]atology Pneumovax in 2012   . OSA on CPAP   . Paresthesia 03/23/2015   Left face  . PONV (postoperative nausea and vomiting)   . Preventative health care 10/06/2013   [facility] of Dermatology Pneumovax in 2012  . Preventative health care 02/26/2016  . Shortness of breath dyspnea    using stairs  . Thyroid cancer ([place]) 1980's  . Thyroid disease   . UTI (urinary tract infection) 04/02/2013   Past Surgical History:  Procedure Laterality Date  . CHOLECYSTECTOMY  1990  . COLON SURGERY    . COLOSTOMY REVISION  2012/12/22   Procedure: COLON RESECTION SIGMOID;  Surgeon: Gwenyth Ober, MD;  Location: [facility];  Service: General;  Laterality: N/A;  . CYSTOSCOPY WITH STENT PLACEMENT  12-22-2012   Procedure: CYSTOSCOPY WITH STENT PLACEMENT;  Surgeon: Hanley Ben, MD;  Location: [facility];  Service: Urology;  Laterality: N/A;  . DILATION AND CURETTAGE OF UTERUS  1960's   "lots of them; had miscarriages" (12-22-12)  . LYSIS OF ADHESION N/A 12/02/2015   Procedure: LAPAROSCOPIC LYSIS OF ADHESION;  Surgeon: Johnathan Hausen, MD;  [facility];  Service: General;  Laterality: N/A;  . ROBOTIC ASSISTED BILATERAL SALPINGO OOPHERECTOMY Bilateral 12/02/2015   Procedure: XI  ROBOTIC ASSISTED BILATERAL SALPINGO OOPHORECTOMY;  Surgeon: Everitt Amber, MD;  [facility];  Service: Gynecology;  Laterality: Bilateral;  . SIGMOID RESECTION / RECTOPEXY  12/19/2012  . THYROIDECTOMY, PARTIAL  1988   "then did iodine to remove the rest" (12/19/2012)  . TONSILLECTOMY  1951?  . TRANSRECTAL DRAINAGE OF PELVIC ABSCESS  10/27/2012  . VAGINAL HYSTERECTOMY  1970's   "still have my ovaries" (12/19/2012)   Social History   Social History  . Marital status: Married    Spouse name: N/A  . Number of children: N/A  . Years of education: N/A   Social History Main Topics  . Smoking status: Never Smoker  . Smokeless tobacco: Never Used  . Alcohol  use 0.0 oz/week     Comment: 12/19/2012 "glass of wine q night q other month or so" beer with pizza   . Drug use: No  . Sexual activity: No   Other Topics Concern  . None   Social History Narrative   Married    children   Allergies  Allergen Reactions  . Neomycin-Bacitracin Zn-Polymyx Rash    Polysporin- is tolerated   . Niacin Other (See Comments) and Cough    "cough til I threw up" (12/19/2012)  . Ciprofloxacin Hives    Got cipro and flagyl at same time, localized hives to IV arm  . Flagyl [Metronidazole] Hives    Got cipro and flagyl at same time, localized hives to IV arm   Family History  Problem Relation Age of Onset  . Heart disease Father   . Pneumonia Father   . Hypertension Father   . Hyperlipidemia Father   . Cancer Father     skin  . Stroke Father   . Cancer Paternal Aunt   . Alzheimer's disease Mother   . Heart disease Mother   . Emphysema Brother     marijuana and cigarettes  . Alcohol abuse Brother   . Hearing loss Brother   . Diabetes Maternal Grandmother   . Alzheimer's disease Paternal Grandmother   . Cancer Paternal Grandmother     lung?- smoker  . Hyperlipidemia Paternal Grandmother   . Heart attack Paternal Grandfather   . Alcohol abuse Paternal Grandfather   . Neurofibromatosis Son     schwanomatosis  . Neurofibromatosis Son     swanomatosis    Past medical history, social, surgical and family history all reviewed in electronic medical record.  No pertanent information unless stated regarding to the chief complaint.   Review of Systems: No headache, visual changes, nausea, vomiting, diarrhea, constipation, dizziness, abdominal pain, skin rash, fevers, chills, night sweats, weight loss, swollen lymph nodes, body aches, joint swelling, muscle aches, chest pain, shortness of breath, mood changes.   Objective  Blood pressure 130/84, pulse 74, weight 204 lb (92.5 kg), SpO2 96 %.  General: No apparent distress alert and oriented x3 mood and affect  normal, dressed appropriately.  HEENT: Pupils equal, extraocular movements intact  Respiratory: Patient's speak in full sentences and does not appear short of breath  Cardiovascular: No lower extremity edema, non tender, no erythema  Skin: Warm dry intact with no signs of infection or rash on extremities or on axial skeleton.  Abdomen: Soft nontender  Neuro: Cranial nerves II through XII are intact, neurovascularly intact in all extremities with 2+ DTRs and 2+ pulses.  Lymph: No lymphadenopathy of posterior or anterior cervical chain or axillae bilaterally.  Gait normal with good balance and coordination.  MSK:  Non tender with  full range of motion and good stability and symmetric strength and tone of  elbows, wrist, hip, knee and ankles bilaterally.  Shoulder: left Inspection reveals no abnormalities, atrophy or asymmetry. Palpation is normal with no tenderness over AC joint or bicipital groove. Patient has no external range of motion. Internal rotation to lateral hip. For flexion 2 is full Rotator cuff strength normal throughout. signs of impingement with positive Neer and Hawkin's tests, but negative empty can sign. Speeds and Yergason's tests normal. No labral pathology noted with negative Obrien's, negative clunk and good stability. Normal scapular function observed. No painful arc and no drop arm sign. No apprehension sign Contralateral shoulder unremarkable  After informed written and verbal consent, patient was seated on exam table. Left shoulder was prepped with alcohol swab and utilizing posterior approach, patient's right glenohumeral space was injected with 4:1  marcaine 0.5%: Kenalog 40mg /dL. Patient tolerated the procedure well without immediate complications.     Impression and Recommendations:     This case required medical decision making of moderate complexity.      Note: This dictation was prepared with Dragon dictation along with smaller phrase technology. Any  transcriptional errors that result from this process are unintentional.

## 2016-09-30 ENCOUNTER — Ambulatory Visit (INDEPENDENT_AMBULATORY_CARE_PROVIDER_SITE_OTHER): Payer: PPO | Admitting: Family Medicine

## 2016-09-30 ENCOUNTER — Encounter: Payer: Self-pay | Admitting: Family Medicine

## 2016-09-30 DIAGNOSIS — M19012 Primary osteoarthritis, left shoulder: Secondary | ICD-10-CM | POA: Insufficient documentation

## 2016-09-30 HISTORY — DX: Primary osteoarthritis, left shoulder: M19.012

## 2016-09-30 MED ORDER — GABAPENTIN 100 MG PO CAPS: 200.0000 mg | ORAL_CAPSULE | Freq: Every day | ORAL | 3 refills | Status: DC

## 2016-09-30 NOTE — Assessment & Plan Note (Signed)
Patient given an injection today. Tolerated the procedure well. We discussed icing regimen and home exercises. We discussed which activities to do in which ones to potentially avoid. Patient with continue to be active. Patient follow-up with me again in 4-6 weeks. Patient may need surgical intervention at some point in the future we can repeat injections every 3-4 months.

## 2016-09-30 NOTE — Patient Instructions (Signed)
Good to see you.  Ice 20 minutes 2 times daily. Usually after activity and before bed. pennsaid pinkie amount topically 2 times daily as needed.  Vitamin D 2000 IU daily  Turmeric 500mg  daily  Tart cherry extract any dose at night.  Avoid activity with your hands outside your peripheral visoin.  Gave you an injection today and can repeat every 3 months.  See me again in 4-6 weeks to make sure you are doing well.

## 2016-10-15 ENCOUNTER — Other Ambulatory Visit: Payer: Self-pay | Admitting: Family Medicine

## 2016-10-15 NOTE — Telephone Encounter (Signed)
Please advise. TL/CMA 

## 2016-10-18 NOTE — Telephone Encounter (Signed)
Faxed [facility] [place]  

## 2016-10-29 DIAGNOSIS — G4733 Obstructive sleep apnea (adult) (pediatric): Secondary | ICD-10-CM | POA: Diagnosis not present

## 2016-11-10 ENCOUNTER — Other Ambulatory Visit: Payer: Self-pay | Admitting: Family Medicine

## 2016-11-10 NOTE — Telephone Encounter (Signed)
Refill sent per [facility] refill protocol/SLS  

## 2016-11-15 ENCOUNTER — Other Ambulatory Visit: Payer: Self-pay | Admitting: Family Medicine

## 2016-11-18 ENCOUNTER — Other Ambulatory Visit: Payer: Self-pay | Admitting: Family Medicine

## 2016-11-18 NOTE — Telephone Encounter (Signed)
Faxed [facility] [place]  

## 2016-11-18 NOTE — Telephone Encounter (Signed)
Requesting:  tramadol Contract   none UDS    none Last OV     09/14/2016 Last Refill    #30 with 0 refills on 10/16/16  Please Advise

## 2016-11-27 DIAGNOSIS — G4733 Obstructive sleep apnea (adult) (pediatric): Secondary | ICD-10-CM | POA: Diagnosis not present

## 2016-12-13 ENCOUNTER — Other Ambulatory Visit: Payer: Self-pay | Admitting: Family Medicine

## 2016-12-14 NOTE — Telephone Encounter (Signed)
Faxed hardcopy for Tramadol to [facility] [place]. 

## 2016-12-14 NOTE — Telephone Encounter (Signed)
Last refill for tramadol was on 11/18/2016   #30 with 0 refills Last office visit 09/14/2016 No Contract/No UDS

## 2016-12-21 ENCOUNTER — Ambulatory Visit: Payer: PPO | Admitting: Internal Medicine

## 2017-01-03 DIAGNOSIS — L2089 Other atopic dermatitis: Secondary | ICD-10-CM | POA: Diagnosis not present

## 2017-01-03 DIAGNOSIS — L304 Erythema intertrigo: Secondary | ICD-10-CM | POA: Diagnosis not present

## 2017-01-03 DIAGNOSIS — L570 Actinic keratosis: Secondary | ICD-10-CM | POA: Diagnosis not present

## 2017-01-03 DIAGNOSIS — Z419 Encounter for procedure for purposes other than remedying health state, unspecified: Secondary | ICD-10-CM | POA: Diagnosis not present

## 2017-01-03 DIAGNOSIS — L821 Other seborrheic keratosis: Secondary | ICD-10-CM | POA: Diagnosis not present

## 2017-01-03 DIAGNOSIS — L738 Other specified follicular disorders: Secondary | ICD-10-CM | POA: Diagnosis not present

## 2017-01-07 ENCOUNTER — Other Ambulatory Visit: Payer: Self-pay | Admitting: Family Medicine

## 2017-01-12 ENCOUNTER — Other Ambulatory Visit: Payer: Self-pay | Admitting: Family Medicine

## 2017-01-12 DIAGNOSIS — J302 Other seasonal allergic rhinitis: Secondary | ICD-10-CM

## 2017-01-12 DIAGNOSIS — K219 Gastro-esophageal reflux disease without esophagitis: Secondary | ICD-10-CM

## 2017-01-12 DIAGNOSIS — R05 Cough: Secondary | ICD-10-CM

## 2017-01-12 DIAGNOSIS — R059 Cough, unspecified: Secondary | ICD-10-CM

## 2017-01-12 NOTE — Telephone Encounter (Signed)
Requesting:  Tramadol Contract   NONE UDS   NONE Last OV    09/14/2016--NEXT SCHEDULED APPT 03/01/2017 Last Refill    #30 with 0 refills on 12/14/2016  Please Advise

## 2017-01-13 NOTE — Telephone Encounter (Signed)
Fax[facility] [place].

## 2017-01-25 ENCOUNTER — Other Ambulatory Visit: Payer: Self-pay | Admitting: Family Medicine

## 2017-01-27 DIAGNOSIS — G4733 Obstructive sleep apnea (adult) (pediatric): Secondary | ICD-10-CM | POA: Diagnosis not present

## 2017-02-09 ENCOUNTER — Other Ambulatory Visit: Payer: Self-pay | Admitting: Family Medicine

## 2017-02-11 DIAGNOSIS — E890 Postprocedural hypothyroidism: Secondary | ICD-10-CM | POA: Diagnosis not present

## 2017-02-11 DIAGNOSIS — C73 Malignant neoplasm of thyroid gland: Secondary | ICD-10-CM | POA: Diagnosis not present

## 2017-02-11 DIAGNOSIS — Z6837 Body mass index (BMI) 37.0-37.9, adult: Secondary | ICD-10-CM | POA: Diagnosis not present

## 2017-02-11 DIAGNOSIS — D649 Anemia, unspecified: Secondary | ICD-10-CM | POA: Diagnosis not present

## 2017-02-11 DIAGNOSIS — Z1389 Encounter for screening for other disorder: Secondary | ICD-10-CM | POA: Diagnosis not present

## 2017-02-11 DIAGNOSIS — E784 Other hyperlipidemia: Secondary | ICD-10-CM | POA: Diagnosis not present

## 2017-02-11 DIAGNOSIS — I10 Essential (primary) hypertension: Secondary | ICD-10-CM | POA: Diagnosis not present

## 2017-02-11 DIAGNOSIS — E871 Hypo-osmolality and hyponatremia: Secondary | ICD-10-CM | POA: Diagnosis not present

## 2017-02-19 ENCOUNTER — Other Ambulatory Visit: Payer: Self-pay | Admitting: Family Medicine

## 2017-02-21 NOTE — Telephone Encounter (Signed)
Last refill on 01/12/2017  #30 no refills Last office visit on 09/14/2016 next office visit on 03/01/2017

## 2017-02-21 NOTE — Telephone Encounter (Signed)
Faxed hardcopy for tramadol to [facility] in [place]. 

## 2017-02-21 NOTE — Telephone Encounter (Signed)
I have printed she will likely need uds and contract?

## 2017-02-28 DIAGNOSIS — G4733 Obstructive sleep apnea (adult) (pediatric): Secondary | ICD-10-CM | POA: Diagnosis not present

## 2017-03-01 ENCOUNTER — Ambulatory Visit (INDEPENDENT_AMBULATORY_CARE_PROVIDER_SITE_OTHER): Payer: PPO | Admitting: Family Medicine

## 2017-03-01 ENCOUNTER — Encounter: Payer: Self-pay | Admitting: Family Medicine

## 2017-03-01 VITALS — BP 134/59 | HR 72 | Temp 98.2°F | Resp 18 | Wt 207.4 lb

## 2017-03-01 DIAGNOSIS — E538 Deficiency of other specified B group vitamins: Secondary | ICD-10-CM | POA: Diagnosis not present

## 2017-03-01 DIAGNOSIS — K219 Gastro-esophageal reflux disease without esophagitis: Secondary | ICD-10-CM | POA: Diagnosis not present

## 2017-03-01 DIAGNOSIS — Z0000 Encounter for general adult medical examination without abnormal findings: Secondary | ICD-10-CM

## 2017-03-01 DIAGNOSIS — E782 Mixed hyperlipidemia: Secondary | ICD-10-CM

## 2017-03-01 DIAGNOSIS — I10 Essential (primary) hypertension: Secondary | ICD-10-CM | POA: Diagnosis not present

## 2017-03-01 DIAGNOSIS — L309 Dermatitis, unspecified: Secondary | ICD-10-CM

## 2017-03-01 DIAGNOSIS — E039 Hypothyroidism, unspecified: Secondary | ICD-10-CM

## 2017-03-01 MED ORDER — TRAMADOL HCL 50 MG PO TABS: ORAL_TABLET | ORAL | 0 refills | Status: DC

## 2017-03-01 MED ORDER — RANITIDINE HCL 300 MG PO TABS: 300.0000 mg | ORAL_TABLET | Freq: Every day | ORAL | 5 refills | Status: DC

## 2017-03-01 NOTE — Assessment & Plan Note (Addendum)
Well controlled, no changes to meds. Encouraged heart healthy diet such as the DASH diet and exercise as tolerated. Amlodipine 2.5 mg daily added by Dr Forde Dandy. She has continued the Valsartan 320 and the hctz daily

## 2017-03-01 NOTE — Progress Notes (Signed)
Subjective:  CMA scribed this visit  Patient ID: Jackie Stevens, female    DOB: [birth year], 71 y.o.   MRN: [id number]  Chief Complaint  Patient presents with  . Annual Exam  . Hypertension    Hypertension  This is a chronic problem. The problem is unchanged. The problem is controlled. Pertinent negatives include no blurred vision, chest pain, headaches, malaise/fatigue, palpitations or shortness of breath. There are no associated agents to hypertension. Past treatments include calcium channel blockers and diuretics. Compliance problems include diet and exercise.     Patient is in today for annual preventative exam and follow up on numerous concerns Including hypertension, hyperlipidemia, sleep apnea, obesity and chronic pain. She feels well today. She does endorse some mild pruritus on her skin most notably behind her right ear. No recent illness or hospitalizations. She does also endorse chronic pain most notably arthritis in her hands and knees. No recent hospitalization or febrile illness.Denies CP/palp/SOB/HA/congestion/fevers/GI or GU c/o. Taking meds as prescribed   Patient Care Team: Mosie Lukes, MD as PCP - General (Family Medicine) Marylynn Pearson, MD as Consulting Physician (Obstetrics and Gynecology) Marica Otter, OD as Consulting Physician (Optometry) Reynold Bowen, MD as Consulting Physician (Endocrinology) Gaynelle Arabian, MD as Consulting Physician (Orthopedic Surgery) Suszanne Finch, DC as Consulting Physician (Chiropractic Medicine) Sydnee Levans, MD as Consulting Physician (Dermatology)   Past Medical History:  Diagnosis Date  . Abnormal cervical cytology 10/25/2012   Follows with Dr Leavy Cella of Gyn  . Allergic rhinitis   . Anemia 10/06/2013  . Anginal pain ([place])    pt has history of CP states had cardiac workup with no specific issues identified   . Anxiety    when increased stress   . Arthritis    "knees; right thumb; shoulders" (12-29-12)  . Asthma     . Chicken pox as a child  . Complication of anesthesia   . Constipation   . Dermatitis 03/06/2017  . Diverticulitis 10/25/2012   pt. reports that a drain was placed - 09/2012    . External hemorrhoid, bleeding    "sometimes" (Dec 29, 2012)  . GERD (gastroesophageal reflux disease)   . H/O hiatal hernia   . Heart murmur   . HTN (hypertension)    stress test completed by Anselm Lis, diagnosed as GERD  . Hyperlipidemia   . Hypothyroidism   . Incontinence   . Insomnia   . Kidney stones 1970's   "passed on their own" (12-29-2012)  . Left shoulder pain 09/14/2016  . Low back pain 06/09/2014  . Measles as a child  . Medicare annual wellness visit, subsequent 10/06/2013   Steinhoffer of Dermatology Pneumovax in 2012   . OSA on CPAP   . Paresthesia 03/23/2015   Left face  . PONV (postoperative nausea and vomiting)   . Preventative health care 10/06/2013   Steinhoffer of Dermatology Pneumovax in 2012  . Preventative health care 02/26/2016  . Shortness of breath dyspnea    using stairs  . Thyroid cancer ([place]) 1980's  . Thyroid disease   . UTI (urinary tract infection) 04/02/2013    Past Surgical History:  Procedure Laterality Date  . CHOLECYSTECTOMY  1990  . COLON SURGERY    . COLOSTOMY REVISION  2012/12/29   Procedure: COLON RESECTION SIGMOID;  Surgeon: Gwenyth Ober, MD;  Location: [facility];  Service: General;  Laterality: N/A;  . CYSTOSCOPY WITH STENT PLACEMENT  December 29, 2012   Procedure: CYSTOSCOPY WITH STENT PLACEMENT;  Surgeon: Hanley Ben, MD;  Location: [facility];  Service: Urology;  Laterality: N/A;  . DILATION AND CURETTAGE OF UTERUS  1960's   "lots of them; had miscarriages" (12/19/2012)  . LYSIS OF ADHESION N/A 12/02/2015   Procedure: LAPAROSCOPIC LYSIS OF ADHESION;  Surgeon: Johnathan Hausen, MD;  Location: [facility];  Service: General;  Laterality: N/A;  . ROBOTIC ASSISTED BILATERAL SALPINGO OOPHERECTOMY Bilateral 12/02/2015   Procedure: XI ROBOTIC ASSISTED BILATERAL SALPINGO  OOPHORECTOMY;  Surgeon: Everitt Amber, MD;  Location: [facility];  Service: Gynecology;  Laterality: Bilateral;  . SIGMOID RESECTION / RECTOPEXY  12/19/2012  . THYROIDECTOMY, PARTIAL  1988   "then did iodine to remove the rest" (12/19/2012)  . TONSILLECTOMY  1951?  . TRANSRECTAL DRAINAGE OF PELVIC ABSCESS  10/27/2012  . VAGINAL HYSTERECTOMY  1970's   "still have my ovaries" (12/19/2012)    Family History  Problem Relation Age of Onset  . Heart disease Father   . Pneumonia Father   . Hypertension Father   . Hyperlipidemia Father   . Cancer Father     skin  . Stroke Father   . Alzheimer's disease Mother   . Heart disease Mother   . Emphysema Brother     marijuana and cigarettes  . Alcohol abuse Brother   . Hearing loss Brother   . Diabetes Maternal Grandmother   . Alzheimer's disease Paternal Grandmother   . Cancer Paternal Grandmother     lung?- smoker  . Hyperlipidemia Paternal Grandmother   . Heart attack Paternal Grandfather   . Alcohol abuse Paternal Grandfather   . Neurofibromatosis Son     schwanomatosis  . Neurofibromatosis Son     swanomatosis  . Cancer Paternal Aunt     Social History   Social History  . Marital status: Married    Spouse name: N/A  . Number of children: N/A  . Years of education: N/A   Occupational History  . Not on file.   Social History Main Topics  . Smoking status: Never Smoker  . Smokeless tobacco: Never Used  . Alcohol use 0.0 oz/week     Comment: 12/19/2012 "glass of wine q night q other month or so" beer with pizza   . Drug use: No  . Sexual activity: No   Other Topics Concern  . Not on file   Social History Narrative   Married    children    Outpatient Medications Prior to Visit  Medication Sig Dispense Refill  . aspirin EC 81 MG tablet Take 81 mg by mouth daily. Reported on 12/19/2015    . cetirizine (ZYRTEC) 10 MG tablet Take 1 tablet (10 mg total) by mouth daily. (Patient taking differently: Take 10 mg by mouth daily as needed  for allergies. ) 30 tablet 11  . docusate sodium (COLACE) 100 MG capsule Take 100 mg by mouth 2 (two) times daily. Reported on 03/30/2016    . fluticasone (FLONASE) 50 MCG/ACT nasal spray PLACE 2 SPRAYS INTO THE NOSE DAILY 16 g 1  . fluticasone furoate-vilanterol (BREO ELLIPTA) 200-25 MCG/INH AEPB Inhale 1 puff into the lungs daily. Rinse mouth 1 each 0  . gabapentin (NEURONTIN) 100 MG capsule Take 2 capsules (200 mg total) by mouth at bedtime. (Patient taking differently: Take 100 mg by mouth at bedtime. ) 60 capsule 3  . hydrochlorothiazide (HYDRODIURIL) 25 MG tablet TAKE 1 TABLET BY MOUTH EVERY DAY 90 tablet 1  . KRILL OIL PO Take 750 mg by mouth daily.     . naproxen (NAPROSYN) 375 MG  tablet TAKE 1 TABLET BY MOUTH TWICE A DAY WITH A MEAL 180 tablet 1  . NITROSTAT 0.4 MG SL tablet PLACE 1 TABLET UNDER THE TONGUE EVERY 5 MINUTES AS NEEDED FOR CHEST PAIN 25 tablet 0  . omeprazole (PRILOSEC) 40 MG capsule TAKE 1 CAPSULE TWICE DAILY 60 capsule 1  . Polyethyl Glycol-Propyl Glycol 0.4-0.3 % GEL Apply 1 drop to eye 2 (two) times daily as needed (dry eyes).     . polyethylene glycol (MIRALAX / GLYCOLAX) packet Take 17 g by mouth daily.    . Probiotic Product (PROBIOTIC ADVANCED PO) Take 2 tablets by mouth at bedtime. Digestive Advantage    . psyllium (REGULOID) 0.52 G capsule Take 0.52 g by mouth at bedtime.    . simvastatin (ZOCOR) 40 MG tablet TAKE 1 TABLET BY MOUTH AT BEDTIME 90 tablet 2  . sodium chloride (OCEAN) 0.65 % SOLN nasal spray Place 1 spray into both nostrils 2 (two) times daily as needed for congestion.     . TURMERIC PO Take 1 tablet by mouth daily. Reported on 12/19/2015    . valsartan (DIOVAN) 320 MG tablet TAKE 1 TABLET BY MOUTH EVERY DAY 90 tablet 1  . Vitamin D, Cholecalciferol, 1000 UNITS CAPS Take 1 capsule by mouth daily.    Marland Kitchen levothyroxine (SYNTHROID, LEVOTHROID) 150 MCG tablet Take 150-225 mcg by mouth daily before breakfast. 1 tablet (150 mcg) every day except Sunday, 1.5 tablet  (225 mcg) on Sunday    . traMADol (ULTRAM) 50 MG tablet TAKE 1 TABLET EVERY 6 HOURS AS NEEDED FOR MODERATE PAIN 30 tablet 0  . promethazine-codeine (PHENERGAN WITH CODEINE) 6.25-10 MG/5ML syrup 1 teaspoon at bedtime for cough 180 mL 0  . ranitidine (ZANTAC) 300 MG tablet TAKE 1 TABLET AT BEDTIME 30 tablet 5   No facility-administered medications prior to visit.     Allergies  Allergen Reactions  . Neomycin-Bacitracin Zn-Polymyx Rash    Polysporin- is tolerated   . Niacin Other (See Comments) and Cough    "cough til I threw up" (12/19/2012)  . Ciprofloxacin Hives    Got cipro and flagyl at same time, localized hives to IV arm  . Flagyl [Metronidazole] Hives    Got cipro and flagyl at same time, localized hives to IV arm    Review of Systems  Constitutional: Negative for fever and malaise/fatigue.  HENT: Negative for congestion.   Eyes: Negative for blurred vision.  Respiratory: Negative for cough and shortness of breath.   Cardiovascular: Negative for chest pain, palpitations and leg swelling.  Gastrointestinal: Negative for vomiting.  Musculoskeletal: Negative for back pain.  Skin: Negative for rash.  Neurological: Negative for loss of consciousness and headaches.       Objective:    Physical Exam  Constitutional: She is oriented to person, place, and time. She appears well-developed and well-nourished. No distress.  HENT:  Head: Normocephalic and atraumatic.  Eyes: Conjunctivae are normal.  Neck: Normal range of motion. No thyromegaly present.  Cardiovascular: Normal rate and regular rhythm.   Pulmonary/Chest: Effort normal and breath sounds normal. She has no wheezes.  Abdominal: Soft. Bowel sounds are normal. There is no tenderness.  Musculoskeletal: Normal range of motion. She exhibits no edema or deformity.  Neurological: She is alert and oriented to person, place, and time.  Skin: Skin is warm and dry. She is not diaphoretic.  Psychiatric: She has a normal mood and  affect.    BP (!) 134/59 (BP Location: Left Arm, Patient Position: Sitting,  Cuff Size: Large)   Pulse 72   Temp 98.2 F (36.8 C) (Oral)   Resp 18   Wt 207 lb 6.4 oz (94.1 kg)   SpO2 100%   BMI 37.93 kg/m  Wt Readings from Last 3 Encounters:  03/01/17 207 lb 6.4 oz (94.1 kg)  09/30/16 204 lb (92.5 kg)  09/14/16 207 lb 4 oz (94 kg)   BP Readings from Last 3 Encounters:  03/01/17 (!) 134/59  09/30/16 130/84  09/21/16 (!) 178/76     Immunization History  Administered Date(s) Administered  . Influenza Split 09/12/2012, 10/02/2015  . Influenza, High Dose Seasonal PF 09/14/2016  . Influenza,inj,Quad PF,36+ Mos 08/30/2013, 10/21/2014, 08/20/2015  . Pneumococcal Conjugate-13 07/19/2011, 11/01/2014  . Pneumococcal Polysaccharide-23 03/30/2016  . Tdap 10/02/2013  . Zoster 12/09/2008    Health Maintenance  Topic Date Due  . Hepatitis C Screening  June 05, 1946  . MAMMOGRAM  08/26/2018  . TETANUS/TDAP  10/03/2023  . COLONOSCOPY  12/21/2023  . INFLUENZA VACCINE  Completed  . DEXA SCAN  Completed  . PNA vac Low Risk Adult  Completed    Lab Results  Component Value Date   WBC 9.9 03/01/2017   HGB 12.6 03/01/2017   HCT 37.3 03/01/2017   PLT 321.0 03/01/2017   GLUCOSE 90 03/01/2017   CHOL 150 03/01/2017   TRIG 128.0 03/01/2017   HDL 51.00 03/01/2017   LDLCALC 74 03/01/2017   ALT 20 03/01/2017   AST 22 03/01/2017   NA 133 (L) 03/01/2017   K 4.2 03/01/2017   CL 95 (L) 03/01/2017   CREATININE 0.81 03/01/2017   BUN 15 03/01/2017   CO2 31 03/01/2017   TSH 0.23 (L) 03/01/2017   INR 0.99 03/16/2015   HGBA1C 5.8 (H) 07/18/2011    Lab Results  Component Value Date   TSH 0.23 (L) 03/01/2017   Lab Results  Component Value Date   WBC 9.9 03/01/2017   HGB 12.6 03/01/2017   HCT 37.3 03/01/2017   MCV 85.8 03/01/2017   PLT 321.0 03/01/2017   Lab Results  Component Value Date   NA 133 (L) 03/01/2017   K 4.2 03/01/2017   CO2 31 03/01/2017   GLUCOSE 90 03/01/2017   BUN  15 03/01/2017   CREATININE 0.81 03/01/2017   BILITOT 0.3 03/01/2017   ALKPHOS 82 03/01/2017   AST 22 03/01/2017   ALT 20 03/01/2017   PROT 6.8 03/01/2017   ALBUMIN 4.1 03/01/2017   CALCIUM 9.0 03/01/2017   ANIONGAP 6 12/03/2015   GFR 73.76 03/01/2017   Lab Results  Component Value Date   CHOL 150 03/01/2017   Lab Results  Component Value Date   HDL 51.00 03/01/2017   Lab Results  Component Value Date   LDLCALC 74 03/01/2017   Lab Results  Component Value Date   TRIG 128.0 03/01/2017   Lab Results  Component Value Date   CHOLHDL 3 03/01/2017   Lab Results  Component Value Date   HGBA1C 5.8 (H) 07/18/2011         Assessment & Plan:   Problem List Items Addressed This Visit    Hyperlipidemia, mixed    Tolerating statin, encouraged heart healthy diet, avoid trans fats, minimize simple carbs and saturated fats. Increase exercise as tolerated      Relevant Medications   amLODipine (NORVASC) 2.5 MG tablet   Other Relevant Orders   Lipid panel (Completed)   Essential hypertension - Primary    Well controlled, no changes to meds. Encouraged heart  healthy diet such as the DASH diet and exercise as tolerated. Amlodipine 2.5 mg daily added by Dr Forde Dandy. She has continued the Valsartan 320 and the hctz daily      Relevant Medications   amLODipine (NORVASC) 2.5 MG tablet   Other Relevant Orders   CBC (Completed)   Comprehensive metabolic panel (Completed)   G E R D    Avoid offending foods, start probiotics. Do not eat large meals in late evening and consider raising head of bed. Omeprazole prn.      Relevant Medications   ranitidine (ZANTAC) 300 MG tablet   Hypothyroidism   Relevant Orders   TSH (Completed)   Preventative health care    Patient encouraged to maintain heart healthy diet, regular exercise, adequate sleep. Consider daily probiotics. Take medications as prescribed      Vitamin B12 deficiency    Continue vitamin B12 daily      Relevant Orders    Vitamin D 1,25 dihydroxy (Completed)   Vitamin B12 (Completed)   Dermatitis    Try mild soaps, Witch Hazel astringent and cortaid notify us if no improvement.         I have discontinued Ms. No's promethazine-codeine. I have also changed her ranitidine. Additionally, I am having her maintain her psyllium, cetirizine, KRILL OIL PO, TURMERIC PO, Polyethyl Glycol-Propyl Glycol, sodium chloride, Probiotic Product (PROBIOTIC ADVANCED PO), Vitamin D (Cholecalciferol), docusate sodium, NITROSTAT, aspirin EC, polyethylene glycol, fluticasone furoate-vilanterol, gabapentin, simvastatin, fluticasone, omeprazole, hydrochlorothiazide, valsartan, naproxen, amLODipine, and traMADol.  Meds ordered this encounter  Medications  . amLODipine (NORVASC) 2.5 MG tablet    Sig: Take 2.5 mg by mouth daily.    Refill:  1  . ranitidine (ZANTAC) 300 MG tablet    Sig: Take 1 tablet (300 mg total) by mouth at bedtime.    Dispense:  30 tablet    Refill:  5  . traMADol (ULTRAM) 50 MG tablet    Sig: TAKE 1 TABLET EVERY 6 HOURS AS NEEDED FOR MODERATE PAIN    Dispense:  30 tablet    Refill:  0    Not to exceed 5 additional fills before 07/12/2017    CMA served as scribe during this visit. History, Physical and Plan performed by medical provider. Documentation and orders reviewed and attested to.  Penni Homans, MD

## 2017-03-01 NOTE — Assessment & Plan Note (Signed)
Tolerating statin, encouraged heart healthy diet, avoid trans fats, minimize simple carbs and saturated fats. Increase exercise as tolerated 

## 2017-03-01 NOTE — Progress Notes (Signed)
Pre visit review using our clinic review tool, if applicable. No additional management support is needed unless otherwise documented below in the visit note. 

## 2017-03-01 NOTE — Patient Instructions (Addendum)
BRING ADVANCE DIRECTIVE TO NEXT APPOINTMENT   Restart the Zyrtec/Cetirizine 10 mg daily if no improvement then hold the Amlodipine for 2 weeks and see if rash resolves if it does not call for referral to dermatology Preventive Care 65 Years and Older, Female Preventive care refers to lifestyle choices and visits with your health care provider that can promote health and wellness. What does preventive care include?  A yearly physical exam. This is also called an annual well check.  Dental exams once or twice a year.  Routine eye exams. Ask your health care provider how often you should have your eyes checked.  Personal lifestyle choices, including:  Daily care of your teeth and gums.  Regular physical activity.  Eating a healthy diet.  Avoiding tobacco and drug use.  Limiting alcohol use.  Practicing safe sex.  Taking low-dose aspirin every day.  Taking vitamin and mineral supplements as recommended by your health care provider. What happens during an annual well check? The services and screenings done by your health care provider during your annual well check will depend on your age, overall health, lifestyle risk factors, and family history of disease. Counseling  Your health care provider may ask you questions about your:  Alcohol use.  Tobacco use.  Drug use.  Emotional well-being.  Home and relationship well-being.  Sexual activity.  Eating habits.  History of falls.  Memory and ability to understand (cognition).  Work and work Statistician.  Reproductive health. Screening  You may have the following tests or measurements:  Height, weight, and BMI.  Blood pressure.  Lipid and cholesterol levels. These may be checked every 5 years, or more frequently if you are over 37 years old.  Skin check.  Lung cancer screening. You may have this screening every year starting at age 76 if you have a 30-pack-year history of smoking and currently smoke or have  quit within the past 15 years.  Fecal occult blood test (FOBT) of the stool. You may have this test every year starting at age 49.  Flexible sigmoidoscopy or colonoscopy. You may have a sigmoidoscopy every 5 years or a colonoscopy every 10 years starting at age 70.  Hepatitis C blood test.  Hepatitis B blood test.  Sexually transmitted disease (STD) testing.  Diabetes screening. This is done by checking your blood sugar (glucose) after you have not eaten for a while (fasting). You may have this done every 1-3 years.  Bone density scan. This is done to screen for osteoporosis. You may have this done starting at age 28.  Mammogram. This may be done every 1-2 years. Talk to your health care provider about how often you should have regular mammograms. Talk with your health care provider about your test results, treatment options, and if necessary, the need for more tests. Vaccines  Your health care provider may recommend certain vaccines, such as:  Influenza vaccine. This is recommended every year.  Tetanus, diphtheria, and acellular pertussis (Tdap, Td) vaccine. You may need a Td booster every 10 years.  Varicella vaccine. You may need this if you have not been vaccinated.  Zoster vaccine. You may need this after age 40.  Measles, mumps, and rubella (MMR) vaccine. You may need at least one dose of MMR if you were born in 1957 or later. You may also need a second dose.  Pneumococcal 13-valent conjugate (PCV13) vaccine. One dose is recommended after age 50.  Pneumococcal polysaccharide (PPSV23) vaccine. One dose is recommended after age 39.  Meningococcal vaccine. You may need this if you have certain conditions.  Hepatitis A vaccine. You may need this if you have certain conditions or if you travel or work in places where you may be exposed to hepatitis A.  Hepatitis B vaccine. You may need this if you have certain conditions or if you travel or work in places where you may be  exposed to hepatitis B.  Haemophilus influenzae type b (Hib) vaccine. You may need this if you have certain conditions. Talk to your health care provider about which screenings and vaccines you need and how often you need them. This information is not intended to replace advice given to you by your health care provider. Make sure you discuss any questions you have with your health care provider. Document Released: 12/26/2015 Document Revised: 08/18/2016 Document Reviewed: 09/30/2015 Elsevier Interactive Patient Education  2017 [employer].

## 2017-03-01 NOTE — Assessment & Plan Note (Signed)
Continue vitamin B12 daily

## 2017-03-02 LAB — COMPREHENSIVE METABOLIC PANEL
ALT: 20 U/L (ref 0–35)
AST: 22 U/L (ref 0–37)
Albumin: 4.1 g/dL (ref 3.5–5.2)
Alkaline Phosphatase: 82 U/L (ref 39–117)
BUN: 15 mg/dL (ref 6–23)
CO2: 31 mEq/L (ref 19–32)
Calcium: 9 mg/dL (ref 8.4–10.5)
Chloride: 95 mEq/L — ABNORMAL LOW (ref 96–112)
Creatinine, Ser: 0.81 mg/dL (ref 0.40–1.20)
GFR: 73.76 mL/min (ref >60.00)
Glucose, Bld: 90 mg/dL (ref 70–99)
Potassium: 4.2 mEq/L (ref 3.5–5.1)
Sodium: 133 mEq/L — ABNORMAL LOW (ref 135–145)
Total Bilirubin: 0.3 mg/dL (ref 0.2–1.2)
Total Protein: 6.8 g/dL (ref 6.0–8.3)

## 2017-03-02 LAB — LIPID PANEL
Cholesterol: 150 mg/dL (ref 0–200)
HDL: 51 mg/dL (ref >39.00)
LDL Cholesterol: 74 mg/dL (ref 0–99)
NonHDL: 99.48
Total CHOL/HDL Ratio: 3
Triglycerides: 128 mg/dL (ref 0.0–149.0)
VLDL: 25.6 mg/dL (ref 0.0–40.0)

## 2017-03-02 LAB — CBC
HCT: 37.3 % (ref 36.0–46.0)
Hemoglobin: 12.6 g/dL (ref 12.0–15.0)
MCHC: 33.8 g/dL (ref 30.0–36.0)
MCV: 85.8 fl (ref 78.0–100.0)
Platelets: 321 10*3/uL (ref 150.0–400.0)
RBC: 4.34 Mil/uL (ref 3.87–5.11)
RDW: 14 % (ref 11.5–15.5)
WBC: 9.9 10*3/uL (ref 4.0–10.5)

## 2017-03-02 LAB — VITAMIN B12: Vitamin B-12: 1403 pg/mL — ABNORMAL HIGH (ref 211–911)

## 2017-03-02 LAB — TSH: TSH: 0.23 u[IU]/mL — ABNORMAL LOW (ref 0.35–4.50)

## 2017-03-03 ENCOUNTER — Other Ambulatory Visit: Payer: Self-pay | Admitting: Family Medicine

## 2017-03-03 DIAGNOSIS — Z79891 Long term (current) use of opiate analgesic: Secondary | ICD-10-CM | POA: Diagnosis not present

## 2017-03-03 LAB — VITAMIN D 1,25 DIHYDROXY
Vitamin D 1, 25 (OH)2 Total: 39 pg/mL (ref 18–72)
Vitamin D2 1, 25 (OH)2: <8 pg/mL
Vitamin D3 1, 25 (OH)2: 39 pg/mL

## 2017-03-03 MED ORDER — LEVOTHYROXINE SODIUM 150 MCG PO TABS: 150.0000 ug | ORAL_TABLET | Freq: Every day | ORAL | 3 refills | Status: DC

## 2017-03-06 ENCOUNTER — Encounter: Payer: Self-pay | Admitting: Family Medicine

## 2017-03-06 DIAGNOSIS — L309 Dermatitis, unspecified: Secondary | ICD-10-CM | POA: Insufficient documentation

## 2017-03-06 HISTORY — DX: Dermatitis, unspecified: L30.9

## 2017-03-06 NOTE — Assessment & Plan Note (Signed)
Avoid offending foods, start probiotics. Do not eat large meals in late evening and consider raising head of bed. Omeprazole prn 

## 2017-03-06 NOTE — Assessment & Plan Note (Signed)
Try mild soaps, Witch Hazel astringent and cortaid notify us if no improvement.

## 2017-03-06 NOTE — Assessment & Plan Note (Signed)
Patient encouraged to maintain heart healthy diet, regular exercise, adequate sleep. Consider daily probiotics. Take medications as prescribed 

## 2017-03-09 ENCOUNTER — Other Ambulatory Visit: Payer: Self-pay | Admitting: Family Medicine

## 2017-03-09 DIAGNOSIS — R059 Cough, unspecified: Secondary | ICD-10-CM

## 2017-03-09 DIAGNOSIS — J302 Other seasonal allergic rhinitis: Secondary | ICD-10-CM

## 2017-03-09 DIAGNOSIS — R05 Cough: Secondary | ICD-10-CM

## 2017-03-09 DIAGNOSIS — K219 Gastro-esophageal reflux disease without esophagitis: Secondary | ICD-10-CM

## 2017-03-14 ENCOUNTER — Other Ambulatory Visit: Payer: Self-pay | Admitting: Family Medicine

## 2017-03-29 ENCOUNTER — Other Ambulatory Visit: Payer: Self-pay | Admitting: Family Medicine

## 2017-03-29 MED ORDER — FLUTICASONE PROPIONATE 50 MCG/ACT NA SUSP: 2.0000 | Freq: Every day | NASAL | 3 refills | Status: DC

## 2017-04-08 ENCOUNTER — Telehealth: Payer: Self-pay | Admitting: *Deleted

## 2017-04-08 MED ORDER — GABAPENTIN 100 MG PO CAPS: 200.0000 mg | ORAL_CAPSULE | Freq: Every day | ORAL | 0 refills | Status: DC

## 2017-04-08 NOTE — Telephone Encounter (Signed)
Rec'd fax stating needing Gabapentin change to a 90 day supply due to insurance. Sent script for 90 day...Jackie Stevens

## 2017-04-16 ENCOUNTER — Other Ambulatory Visit: Payer: Self-pay | Admitting: Family Medicine

## 2017-04-18 NOTE — Telephone Encounter (Signed)
Faxed [facility] [place]

## 2017-04-18 NOTE — Telephone Encounter (Signed)
Requesting:   Tramadol Contract    03/01/2017 UDS    Low risk---next is due 09/01/2017 Last OV     03/01/17----future appt 09/05/2017 Last Refill   #30 no refills on 03/01/17  Please Advise

## 2017-04-28 DIAGNOSIS — G4733 Obstructive sleep apnea (adult) (pediatric): Secondary | ICD-10-CM | POA: Diagnosis not present

## 2017-05-11 ENCOUNTER — Other Ambulatory Visit: Payer: Self-pay | Admitting: Family Medicine

## 2017-05-11 DIAGNOSIS — R059 Cough, unspecified: Secondary | ICD-10-CM

## 2017-05-11 DIAGNOSIS — K219 Gastro-esophageal reflux disease without esophagitis: Secondary | ICD-10-CM

## 2017-05-11 DIAGNOSIS — R05 Cough: Secondary | ICD-10-CM

## 2017-05-11 DIAGNOSIS — J302 Other seasonal allergic rhinitis: Secondary | ICD-10-CM

## 2017-05-20 DIAGNOSIS — M170 Bilateral primary osteoarthritis of knee: Secondary | ICD-10-CM | POA: Diagnosis not present

## 2017-05-20 DIAGNOSIS — M1711 Unilateral primary osteoarthritis, right knee: Secondary | ICD-10-CM | POA: Diagnosis not present

## 2017-05-20 DIAGNOSIS — M1712 Unilateral primary osteoarthritis, left knee: Secondary | ICD-10-CM | POA: Diagnosis not present

## 2017-05-22 ENCOUNTER — Other Ambulatory Visit: Payer: Self-pay | Admitting: Family Medicine

## 2017-05-23 NOTE — Telephone Encounter (Signed)
  Refill Request: Tramadol   Last RX:04/18/17 Last OV: 04/08/17 Next OV:9/42/18 UDS: 03/01/17 CSC:03/01/17

## 2017-05-23 NOTE — Progress Notes (Signed)
Subjective:   Jackie Stevens is a 71 y.o. female who presents for Medicare Annual (Subsequent) preventive examination.  Review of Systems:  No ROS.  Medicare Wellness Visit. Additional risk factors are reflected in the social history.  Cardiac Risk Factors include: advanced age (>49men, >28 women);dyslipidemia;hypertension;obesity (BMI >30kg/m2);sedentary lifestyle Sleep patterns:  Sleeps about 6 hrs. Occasionally naps. Home Safety/Smoke Alarms: Feels safe in home. Smoke alarms in place.  Living environment; residence and Firearm Safety: Lives with husband and adult son. Stairs but doesn't have to use them. Guns safely stored.   Seat Belt Safety/Bike Helmet: Wears seat belt.   Counseling:   Eye Exam- Wears glasses. Dr.Miller yearly. Dental- Dr.Westin every 6 months.  Female:   Pap-hysterectomy      Mammo-  Last 08/26/16:  BI-RADS Category 2: Benign    Dexa scan-   Last 08/08/15:  normal    CCS- last 12/20/13: pt states it was normal and told she no longer needs routine screenings.Dictation #1 [id number]  [id number]     Objective:     Vitals: BP (!) 150/78 (BP Location: Right Arm, Patient Position: Sitting, Cuff Size: Large)   Pulse 80   Ht 5\' 2"  (1.575 m)   Wt 204 lb (92.5 kg)   SpO2 98%   BMI 37.31 kg/m   Body mass index is 37.31 kg/m.   Tobacco History  Smoking Status  . Never Smoker  Smokeless Tobacco  . Never Used     Counseling given: No   Past Medical History:  Diagnosis Date  . Abnormal cervical cytology 10/25/2012   Follows with Dr Leavy Cella of Gyn  . Allergic rhinitis   . Anemia 10/06/2013  . Anginal pain ([place])    pt has history of CP states had cardiac workup with no specific issues identified   . Anxiety    when increased stress   . Arthritis    "knees; right thumb; shoulders" (2013-01-05)  . Asthma   . Chicken pox as a child  . Complication of anesthesia   . Constipation   . Dermatitis 03/06/2017  . Diverticulitis 10/25/2012   pt.  reports that a drain was placed - 09/2012    . External hemorrhoid, bleeding    "sometimes" (01/05/13)  . GERD (gastroesophageal reflux disease)   . H/O hiatal hernia   . Heart murmur   . HTN (hypertension)    stress test completed by Anselm Lis, diagnosed as GERD  . Hyperlipidemia   . Hypothyroidism   . Incontinence   . Insomnia   . Kidney stones 1970's   "passed on their own" (01/05/2013)  . Left shoulder pain 09/14/2016  . Low back pain 06/09/2014  . Measles as a child  . Medicare annual wellness visit, subsequent 10/06/2013   [facility]inhoffer of Dermatology Pneumovax in 2012   . OSA on CPAP   . Paresthesia 03/23/2015   Left face  . PONV (postoperative nausea and vomiting)   . Preventative health care 10/06/2013   [facility] of Dermatology Pneumovax in 2012  . Preventative health care 02/26/2016  . Shortness of breath dyspnea    using stairs  . Thyroid cancer ([place]) 1980's  . Thyroid disease   . UTI (urinary tract infection) 04/02/2013   Past Surgical History:  Procedure Laterality Date  . CHOLECYSTECTOMY  1990  . COLON SURGERY    . COLOSTOMY REVISION  05-Jan-2013   Procedure: COLON RESECTION SIGMOID;  Surgeon: Gwenyth Ober, MD;  Location: [facility];  Service: General;  Laterality: N/A;  . CYSTOSCOPY WITH STENT PLACEMENT  12/19/2012   Procedure: CYSTOSCOPY WITH STENT PLACEMENT;  Surgeon: Hanley Ben, MD;  Location: [facility];  Service: Urology;  Laterality: N/A;  . DILATION AND CURETTAGE OF UTERUS  1960's   "lots of them; had miscarriages" (12/19/2012)  . LYSIS OF ADHESION N/A 12/02/2015   Procedure: LAPAROSCOPIC LYSIS OF ADHESION;  Surgeon: Johnathan Hausen, MD;  Location: [facility];  Service: General;  Laterality: N/A;  . ROBOTIC ASSISTED BILATERAL SALPINGO OOPHERECTOMY Bilateral 12/02/2015   Procedure: XI ROBOTIC ASSISTED BILATERAL SALPINGO OOPHORECTOMY;  Surgeon: Everitt Amber, MD;  Location: [facility];  Service: Gynecology;  Laterality: Bilateral;  . SIGMOID RESECTION / RECTOPEXY  12/19/2012    . THYROIDECTOMY, PARTIAL  1988   "then did iodine to remove the rest" (12/19/2012)  . TONSILLECTOMY  1951?  . TRANSRECTAL DRAINAGE OF PELVIC ABSCESS  10/27/2012  . VAGINAL HYSTERECTOMY  1970's   "still have my ovaries" (12/19/2012)   Family History  Problem Relation Age of Onset  . Heart disease Father   . Pneumonia Father   . Hypertension Father   . Hyperlipidemia Father   . Cancer Father        skin  . Stroke Father   . Alzheimer's disease Mother   . Heart disease Mother   . Emphysema Brother        marijuana and cigarettes  . Alcohol abuse Brother   . Hearing loss Brother   . Diabetes Maternal Grandmother   . Alzheimer's disease Paternal Grandmother   . Cancer Paternal Grandmother        lung?- smoker  . Hyperlipidemia Paternal Grandmother   . Heart attack Paternal Grandfather   . Alcohol abuse Paternal Grandfather   . Neurofibromatosis Son        schwanomatosis  . Neurofibromatosis Son        swanomatosis  . Cancer Paternal Aunt    History  Sexual Activity  . Sexual activity: No    Outpatient Encounter Prescriptions as of 05/24/2017  Medication Sig  . amLODipine (NORVASC) 2.5 MG tablet Take 2.5 mg by mouth daily.  Marland Kitchen aspirin EC 81 MG tablet Take 81 mg by mouth daily. Reported on 12/19/2015  . cetirizine (ZYRTEC) 10 MG tablet Take 1 tablet (10 mg total) by mouth daily. (Patient taking differently: Take 10 mg by mouth daily as needed for allergies. )  . docusate sodium (COLACE) 100 MG capsule Take 100 mg by mouth 2 (two) times daily. Reported on 03/30/2016  . fluticasone (FLONASE) 50 MCG/ACT nasal spray Place 2 sprays into both nostrils daily.  Marland Kitchen gabapentin (NEURONTIN) 100 MG capsule Take 2 capsules (200 mg total) by mouth at bedtime.  . hydrochlorothiazide (HYDRODIURIL) 25 MG tablet TAKE 1 TABLET BY MOUTH EVERY DAY  . KRILL OIL PO Take 750 mg by mouth daily.   . naproxen (NAPROSYN) 375 MG tablet TAKE 1 TABLET BY MOUTH TWICE A DAY WITH A MEAL  . NITROSTAT 0.4 MG SL  tablet PLACE 1 TABLET UNDER THE TONGUE EVERY 5 MINUTES AS NEEDED FOR CHEST PAIN  . omeprazole (PRILOSEC) 40 MG capsule TAKE 1 CAPSULE TWICE DAILY  . Polyethyl Glycol-Propyl Glycol 0.4-0.3 % GEL Apply 1 drop to eye 2 (two) times daily as needed (dry eyes).   . polyethylene glycol (MIRALAX / GLYCOLAX) packet Take 17 g by mouth daily.  . Probiotic Product (PROBIOTIC ADVANCED PO) Take 2 tablets by mouth at bedtime. Digestive Advantage  . psyllium (REGULOID) 0.52 G capsule Take 0.52  g by mouth at bedtime.  . ranitidine (ZANTAC) 300 MG tablet Take 1 tablet (300 mg total) by mouth at bedtime.  . simvastatin (ZOCOR) 40 MG tablet TAKE 1 TABLET BY MOUTH AT BEDTIME  . sodium chloride (OCEAN) 0.65 % SOLN nasal spray Place 1 spray into both nostrils 2 (two) times daily as needed for congestion.   . traMADol (ULTRAM) 50 MG tablet TAKE 1 TABLET BY MOUTH EVERY 6 HOURS AS NEEDED FOR MODERATE PAIN  . TURMERIC PO Take 1 tablet by mouth daily. Reported on 12/19/2015  . valsartan (DIOVAN) 320 MG tablet TAKE 1 TABLET BY MOUTH EVERY DAY  . Vitamin D, Cholecalciferol, 1000 UNITS CAPS Take 1 capsule by mouth daily.  . fluticasone furoate-vilanterol (BREO ELLIPTA) 200-25 MCG/INH AEPB Inhale 1 puff into the lungs daily. Rinse mouth (Patient not taking: Reported on 05/24/2017)  . levothyroxine (SYNTHROID, LEVOTHROID) 150 MCG tablet Take 1 tablet (150 mcg total) by mouth daily before breakfast. (Patient not taking: Reported on 05/24/2017)  . [DISCONTINUED] omeprazole (PRILOSEC) 40 MG capsule TAKE 1 CAPSULE TWICE DAILY   No facility-administered encounter medications on file as of 05/24/2017.     Activities of Daily Living In your present state of health, do you have any difficulty performing the following activities: 05/24/2017 03/01/2017  Hearing? N Y  Vision? N Y  Difficulty concentrating or making decisions? Y N  Walking or climbing stairs? N N  Dressing or bathing? N N  Doing errands, shopping? N N  Preparing Food and  eating ? N -  Using the Toilet? N -  In the past six months, have you accidently leaked urine? Y -  Do you have problems with loss of bowel control? N -  Managing your Medications? N -  Managing your Finances? N -  Housekeeping or managing your Housekeeping? N -  Some recent data might be hidden    Patient Care Team: Mosie Lukes, MD as PCP - General (Family Medicine) Marylynn Pearson, MD as Consulting Physician (Obstetrics and Gynecology) Marica Otter, [place] as Consulting Physician (Optometry) Reynold Bowen, MD as Consulting Physician (Endocrinology) Gaynelle Arabian, MD as Consulting Physician (Orthopedic Surgery) Meylor, Marlou Sa, [place] as Consulting Physician (Chiropractic Medicine) Sydnee Levans, MD as Consulting Physician (Dermatology)    Assessment:    Physical assessment deferred to PCP.  Exercise Activities and Dietary recommendations Current Exercise Habits: Structured exercise class, Type of exercise: calisthenics;stretching;yoga, Time (Minutes): 60, Frequency (Times/Week): 3, Weekly Exercise (Minutes/Week): 180, Intensity: Mild Diet (meal preparation, eat out, water intake, caffeinated beverages, dairy products, fruits and vegetables): in general, an "unhealthy" diet Breakfast: oatmeal and banana cookie Lunch: [place] sandwich and cookie  Dinner:  varies     Pt states she eats too many sweets, but drinks plenty of water.  Goals      Patient Stated   . Lose 20 lbs  (pt-stated)      Other   . Increase physical activity      Fall Risk Fall Risk  05/24/2017 03/01/2017 09/14/2016 08/20/2015 11/01/2014  Falls in the past year? No No No No No   Depression Screen PHQ 2/9 Scores 05/24/2017 03/01/2017 09/14/2016 08/20/2015  PHQ - 2 Score 0 0 0 0     Cognitive Function MMSE - Mini Mental State Exam 05/24/2017 08/20/2015  Orientation to time 5 5  Orientation to Place 5 5  Registration 3 3  Attention/ Calculation 5 5  Recall 3 3  Language- name 2 objects 2 2  Language- repeat  1 1  Language- follow 3 step command 3 3  Language- read & follow direction 1 1  Write a sentence 1 1  Copy design 1 1  Total score 30 30        Immunization History  Administered Date(s) Administered  . Influenza Split 09/12/2012, 10/02/2015  . Influenza, High Dose Seasonal PF 09/14/2016  . Influenza,inj,Quad PF,36+ Mos 08/30/2013, 10/21/2014, 08/20/2015  . Pneumococcal Conjugate-13 07/19/2011, 11/01/2014  . Pneumococcal Polysaccharide-23 03/30/2016  . Tdap 10/02/2013  . Zoster 12/09/2008   Screening Tests Health Maintenance  Topic Date Due  . Hepatitis C Screening  Aug 21, 1946  . INFLUENZA VACCINE  07/13/2017  . MAMMOGRAM  08/26/2018  . TETANUS/TDAP  10/03/2023  . COLONOSCOPY  12/21/2023  . DEXA SCAN  Completed  . PNA vac Low Risk Adult  Completed      Plan:     Follow up with Dr.Blyth as scheduled 09/05/17.  Eat heart healthy diet (full of fruits, vegetables, whole grains, lean protein, water--limit salt, fat, and sugar intake) and increase physical activity as tolerated.  Continue doing brain stimulating activities (puzzles, reading, adult coloring books, staying active) to keep memory sharp.    I have personally reviewed and noted the following in the patient's chart:   . Medical and social history . Use of alcohol, tobacco or illicit drugs  . Current medications and supplements . Functional ability and status . Nutritional status . Physical activity . Advanced directives . List of other physicians . Hospitalizations, surgeries, and ER visits in previous 12 months . Vitals . Screenings to include cognitive, depression, and falls . Referrals and appointments  In addition, I have reviewed and discussed with patient certain preventive protocols, quality metrics, and best practice recommendations. A written personalized care plan for preventive services as well as general preventive health recommendations were provided to patient.     Shela Nevin,  [place]  05/24/2017   Medical screening examination was performed by Health Coach and as supervising physician I was immediately available for consultation/collaboration. I have reviewed documentation and agree with assessment and plan.  Penni Homans, MD

## 2017-05-24 ENCOUNTER — Encounter: Payer: Self-pay | Admitting: *Deleted

## 2017-05-24 ENCOUNTER — Ambulatory Visit (INDEPENDENT_AMBULATORY_CARE_PROVIDER_SITE_OTHER): Payer: PPO | Admitting: *Deleted

## 2017-05-24 VITALS — BP 150/78 | HR 80 | Ht 62.0 in | Wt 204.0 lb

## 2017-05-24 DIAGNOSIS — Z0000 Encounter for general adult medical examination without abnormal findings: Secondary | ICD-10-CM

## 2017-05-24 NOTE — Patient Instructions (Signed)
Jackie Stevens , Thank you for taking time to come for your Medicare Wellness Visit. I appreciate your ongoing commitment to your health goals. Please review the following plan we discussed and let me know if I can assist you in the future.   These are the goals we discussed: Goals      Patient Stated   . Lose 20 lbs  (pt-stated)      Other   . Increase physical activity       This is a list of the screening recommended for you and due dates:  Health Maintenance  Topic Date Due  .  Hepatitis C: One time screening is recommended by Center for Disease Control  (CDC) for  adults born from [birth year] through 1967.   [birth year]  . Flu Shot  07/13/2017  . Mammogram  08/26/2018  . Tetanus Vaccine  10/03/2023  . Colon Cancer Screening  12/21/2023  . DEXA scan (bone density measurement)  Completed  . Pneumonia vaccines  Completed    Follow up with Dr.Blyth as scheduled 09/05/17.  Eat heart healthy diet (full of fruits, vegetables, whole grains, lean protein, water--limit salt, fat, and sugar intake) and increase physical activity as tolerated.  Continue doing brain stimulating activities (puzzles, reading, adult coloring books, staying active) to keep memory sharp.    Health Maintenance for Postmenopausal Women Menopause is a normal process in which your reproductive ability comes to an end. This process happens gradually over a span of months to years, usually between the ages of 26 and 5. Menopause is complete when you have missed 12 consecutive menstrual periods. It is important to talk with your health care provider about some of the most common conditions that affect postmenopausal women, such as heart disease, cancer, and bone loss (osteoporosis). Adopting a healthy lifestyle and getting preventive care can help to promote your health and wellness. Those actions can also lower your chances of developing some of these common conditions. What should I know about menopause? During menopause, you  may experience a number of symptoms, such as:  Moderate-to-severe hot flashes.  Night sweats.  Decrease in sex drive.  Mood swings.  Headaches.  Tiredness.  Irritability.  Memory problems.  Insomnia.  Choosing to treat or not to treat menopausal changes is an individual decision that you make with your health care provider. What should I know about hormone replacement therapy and supplements? Hormone therapy products are effective for treating symptoms that are associated with menopause, such as hot flashes and night sweats. Hormone replacement carries certain risks, especially as you become older. If you are thinking about using estrogen or estrogen with progestin treatments, discuss the benefits and risks with your health care provider. What should I know about heart disease and stroke? Heart disease, heart attack, and stroke become more likely as you age. This may be due, in part, to the hormonal changes that your body experiences during menopause. These can affect how your body processes dietary fats, triglycerides, and cholesterol. Heart attack and stroke are both medical emergencies. There are many things that you can do to help prevent heart disease and stroke:  Have your blood pressure checked at least every 1-2 years. High blood pressure causes heart disease and increases the risk of stroke.  If you are 72-48 years old, ask your health care provider if you should take aspirin to prevent a heart attack or a stroke.  Do not use any tobacco products, including cigarettes, chewing tobacco, or electronic cigarettes. If you  need help quitting, ask your health care provider.  It is important to eat a healthy diet and maintain a healthy weight. ? Be sure to include plenty of vegetables, fruits, low-fat dairy products, and lean protein. ? Avoid eating foods that are high in solid fats, added sugars, or salt (sodium).  Get regular exercise. This is one of the most important things  that you can do for your health. ? Try to exercise for at least 150 minutes each week. The type of exercise that you do should increase your heart rate and make you sweat. This is known as moderate-intensity exercise. ? Try to do strengthening exercises at least twice each week. Do these in addition to the moderate-intensity exercise.  Know your numbers.Ask your health care provider to check your cholesterol and your blood glucose. Continue to have your blood tested as directed by your health care provider.  What should I know about cancer screening? There are several types of cancer. Take the following steps to reduce your risk and to catch any cancer development as early as possible. Breast Cancer  Practice breast self-awareness. ? This means understanding how your breasts normally appear and feel. ? It also means doing regular breast self-exams. Let your health care provider know about any changes, no matter how small.  If you are 61 or older, have a clinician do a breast exam (clinical breast exam or CBE) every year. Depending on your age, family history, and medical history, it may be recommended that you also have a yearly breast X-ray (mammogram).  If you have a family history of breast cancer, talk with your health care provider about genetic screening.  If you are at high risk for breast cancer, talk with your health care provider about having an MRI and a mammogram every year.  Breast cancer (BRCA) gene test is recommended for women who have family members with BRCA-related cancers. Results of the assessment will determine the need for genetic counseling and BRCA1 and for BRCA2 testing. BRCA-related cancers include these types: ? Breast. This occurs in males or females. ? Ovarian. ? Tubal. This may also be called fallopian tube cancer. ? Cancer of the abdominal or pelvic lining (peritoneal cancer). ? Prostate. ? Pancreatic.  Cervical, Uterine, and Ovarian Cancer Your health  care provider may recommend that you be screened regularly for cancer of the pelvic organs. These include your ovaries, uterus, and vagina. This screening involves a pelvic exam, which includes checking for microscopic changes to the surface of your cervix (Pap test).  For women ages 21-65, health care providers may recommend a pelvic exam and a Pap test every three years. For women ages 50-65, they may recommend the Pap test and pelvic exam, combined with testing for human papilloma virus (HPV), every five years. Some types of HPV increase your risk of cervical cancer. Testing for HPV may also be done on women of any age who have unclear Pap test results.  Other health care providers may not recommend any screening for nonpregnant women who are considered low risk for pelvic cancer and have no symptoms. Ask your health care provider if a screening pelvic exam is right for you.  If you have had past treatment for cervical cancer or a condition that could lead to cancer, you need Pap tests and screening for cancer for at least 20 years after your treatment. If Pap tests have been discontinued for you, your risk factors (such as having a new sexual partner) need to be  reassessed to determine if you should start having screenings again. Some women have medical problems that increase the chance of getting cervical cancer. In these cases, your health care provider may recommend that you have screening and Pap tests more often.  If you have a family history of uterine cancer or ovarian cancer, talk with your health care provider about genetic screening.  If you have vaginal bleeding after reaching menopause, tell your health care provider.  There are currently no reliable tests available to screen for ovarian cancer.  Lung Cancer Lung cancer screening is recommended for adults 49-85 years old who are at high risk for lung cancer because of a history of smoking. A yearly low-dose CT scan of the lungs is  recommended if you:  Currently smoke.  Have a history of at least 30 pack-years of smoking and you currently smoke or have quit within the past 15 years. A pack-year is smoking an average of one pack of cigarettes per day for one year.  Yearly screening should:  Continue until it has been 15 years since you quit.  Stop if you develop a health problem that would prevent you from having lung cancer treatment.  Colorectal Cancer  This type of cancer can be detected and can often be prevented.  Routine colorectal cancer screening usually begins at age 52 and continues through age 43.  If you have risk factors for colon cancer, your health care provider may recommend that you be screened at an earlier age.  If you have a family history of colorectal cancer, talk with your health care provider about genetic screening.  Your health care provider may also recommend using home test kits to check for hidden blood in your stool.  A small camera at the end of a tube can be used to examine your colon directly (sigmoidoscopy or colonoscopy). This is done to check for the earliest forms of colorectal cancer.  Direct examination of the colon should be repeated every 5-10 years until age 61. However, if early forms of precancerous polyps or small growths are found or if you have a family history or genetic risk for colorectal cancer, you may need to be screened more often.  Skin Cancer  Check your skin from head to toe regularly.  Monitor any moles. Be sure to tell your health care provider: ? About any new moles or changes in moles, especially if there is a change in a mole's shape or color. ? If you have a mole that is larger than the size of a pencil eraser.  If any of your family members has a history of skin cancer, especially at a young age, talk with your health care provider about genetic screening.  Always use sunscreen. Apply sunscreen liberally and repeatedly throughout the  day.  Whenever you are outside, protect yourself by wearing long sleeves, pants, a wide-brimmed hat, and sunglasses.  What should I know about osteoporosis? Osteoporosis is a condition in which bone destruction happens more quickly than new bone creation. After menopause, you may be at an increased risk for osteoporosis. To help prevent osteoporosis or the bone fractures that can happen because of osteoporosis, the following is recommended:  If you are 54-75 years old, get at least 1,000 mg of calcium and at least 600 mg of vitamin D per day.  If you are older than age 60 but younger than age 84, get at least 1,200 mg of calcium and at least 600 mg of vitamin D per  day.  If you are older than age 10, get at least 1,200 mg of calcium and at least 800 mg of vitamin D per day.  Smoking and excessive alcohol intake increase the risk of osteoporosis. Eat foods that are rich in calcium and vitamin D, and do weight-bearing exercises several times each week as directed by your health care provider. What should I know about how menopause affects my mental health? Depression may occur at any age, but it is more common as you become older. Common symptoms of depression include:  Low or sad mood.  Changes in sleep patterns.  Changes in appetite or eating patterns.  Feeling an overall lack of motivation or enjoyment of activities that you previously enjoyed.  Frequent crying spells.  Talk with your health care provider if you think that you are experiencing depression. What should I know about immunizations? It is important that you get and maintain your immunizations. These include:  Tetanus, diphtheria, and pertussis (Tdap) booster vaccine.  Influenza every year before the flu season begins.  Pneumonia vaccine.  Shingles vaccine.  Your health care provider may also recommend other immunizations. This information is not intended to replace advice given to you by your health care provider.  Make sure you discuss any questions you have with your health care provider. Document Released: 01/21/2006 Document Revised: 06/18/2016 Document Reviewed: 09/02/2015 Elsevier Interactive Patient Education  2018 [employer].

## 2017-05-27 ENCOUNTER — Other Ambulatory Visit: Payer: Self-pay | Admitting: Family Medicine

## 2017-05-27 MED ORDER — TRAMADOL HCL 50 MG PO TABS: 50.0000 mg | ORAL_TABLET | Freq: Four times a day (QID) | ORAL | 0 refills | Status: DC | PRN

## 2017-05-27 NOTE — Telephone Encounter (Signed)
OK to do on behalf of reg PCP.

## 2017-05-27 NOTE — Telephone Encounter (Signed)
Called in refill per covering provider instructions.

## 2017-05-27 NOTE — Addendum Note (Signed)
Addended by: Sharon Seller B on: 05/27/2017 01:11 PM   Modules accepted: Orders

## 2017-05-27 NOTE — Telephone Encounter (Signed)
Do not think this request was addressed

## 2017-05-27 NOTE — Telephone Encounter (Signed)
Last refill #30 on

## 2017-05-30 DIAGNOSIS — G4733 Obstructive sleep apnea (adult) (pediatric): Secondary | ICD-10-CM | POA: Diagnosis not present

## 2017-06-23 DIAGNOSIS — G4733 Obstructive sleep apnea (adult) (pediatric): Secondary | ICD-10-CM | POA: Diagnosis not present

## 2017-07-06 DIAGNOSIS — H6122 Impacted cerumen, left ear: Secondary | ICD-10-CM | POA: Diagnosis not present

## 2017-07-06 DIAGNOSIS — H6061 Unspecified chronic otitis externa, right ear: Secondary | ICD-10-CM | POA: Diagnosis not present

## 2017-07-06 DIAGNOSIS — J301 Allergic rhinitis due to pollen: Secondary | ICD-10-CM | POA: Diagnosis not present

## 2017-08-01 ENCOUNTER — Telehealth: Payer: Self-pay | Admitting: Family Medicine

## 2017-08-01 NOTE — Telephone Encounter (Signed)
Pt would like to be advised on recall for Valsartan   CB: [phone]

## 2017-08-02 ENCOUNTER — Other Ambulatory Visit: Payer: Self-pay | Admitting: Family Medicine

## 2017-08-02 NOTE — Telephone Encounter (Signed)
Please advise on medication change   PC

## 2017-08-02 NOTE — Telephone Encounter (Signed)
OK to d/c Valsartan and start Losartan 100 mg po daily, disp #30 with 5 rf or #90 with 1 at patient discretion

## 2017-08-04 MED ORDER — LOSARTAN POTASSIUM 100 MG PO TABS: 100.0000 mg | ORAL_TABLET | Freq: Every day | ORAL | 1 refills | Status: DC

## 2017-08-04 NOTE — Addendum Note (Signed)
Addended by: Magdalene Molly A on: 08/04/2017 04:23 PM   Modules accepted: Orders

## 2017-08-04 NOTE — Telephone Encounter (Signed)
Rx sent to patients pharmacy listed.    PC

## 2017-08-09 ENCOUNTER — Other Ambulatory Visit: Payer: Self-pay | Admitting: Family Medicine

## 2017-08-19 ENCOUNTER — Other Ambulatory Visit: Payer: Self-pay | Admitting: Family Medicine

## 2017-08-19 NOTE — Telephone Encounter (Signed)
Refill done.  

## 2017-08-21 ENCOUNTER — Other Ambulatory Visit: Payer: Self-pay | Admitting: Family Medicine

## 2017-08-22 ENCOUNTER — Other Ambulatory Visit: Payer: Self-pay | Admitting: Family Medicine

## 2017-08-22 ENCOUNTER — Other Ambulatory Visit: Payer: Self-pay

## 2017-08-22 MED ORDER — RANITIDINE HCL 300 MG PO TABS: 300.0000 mg | ORAL_TABLET | Freq: Every day | ORAL | 0 refills | Status: DC

## 2017-08-22 MED ORDER — SIMVASTATIN 40 MG PO TABS: 40.0000 mg | ORAL_TABLET | Freq: Every day | ORAL | 0 refills | Status: DC

## 2017-08-29 ENCOUNTER — Telehealth: Payer: Self-pay | Admitting: *Deleted

## 2017-08-29 NOTE — Telephone Encounter (Signed)
Received Physician Orders from [facility]; forwarded to provider/SLS 09/17

## 2017-08-30 DIAGNOSIS — Z1231 Encounter for screening mammogram for malignant neoplasm of breast: Secondary | ICD-10-CM | POA: Diagnosis not present

## 2017-08-30 DIAGNOSIS — Z780 Asymptomatic menopausal state: Secondary | ICD-10-CM | POA: Diagnosis not present

## 2017-08-30 LAB — HM DEXA SCAN: HM Dexa Scan: NORMAL

## 2017-08-30 LAB — HM MAMMOGRAPHY

## 2017-09-05 ENCOUNTER — Encounter: Payer: Self-pay | Admitting: Family Medicine

## 2017-09-05 ENCOUNTER — Ambulatory Visit (INDEPENDENT_AMBULATORY_CARE_PROVIDER_SITE_OTHER): Payer: PPO | Admitting: Family Medicine

## 2017-09-05 VITALS — BP 152/72 | HR 70 | Temp 98.3°F | Resp 18 | Wt 204.8 lb

## 2017-09-05 DIAGNOSIS — Z23 Encounter for immunization: Secondary | ICD-10-CM | POA: Diagnosis not present

## 2017-09-05 DIAGNOSIS — R413 Other amnesia: Secondary | ICD-10-CM | POA: Diagnosis not present

## 2017-09-05 DIAGNOSIS — I10 Essential (primary) hypertension: Secondary | ICD-10-CM

## 2017-09-05 DIAGNOSIS — E669 Obesity, unspecified: Secondary | ICD-10-CM | POA: Diagnosis not present

## 2017-09-05 DIAGNOSIS — E538 Deficiency of other specified B group vitamins: Secondary | ICD-10-CM

## 2017-09-05 DIAGNOSIS — E782 Mixed hyperlipidemia: Secondary | ICD-10-CM

## 2017-09-05 DIAGNOSIS — G3184 Mild cognitive impairment, so stated: Secondary | ICD-10-CM | POA: Insufficient documentation

## 2017-09-05 MED ORDER — AMLODIPINE BESYLATE 2.5 MG PO TABS: 2.5000 mg | ORAL_TABLET | Freq: Two times a day (BID) | ORAL | 1 refills | Status: DC

## 2017-09-05 NOTE — Assessment & Plan Note (Addendum)
Mildly elevated Amlodipine 2.5 mg po bid Encouraged heart healthy diet such as the DASH diet and exercise as tolerated.

## 2017-09-05 NOTE — Assessment & Plan Note (Signed)
Encouraged heart healthy diet, increase exercise, avoid trans fats, consider a krill oil cap daily 

## 2017-09-05 NOTE — Assessment & Plan Note (Signed)
Questions her inability to remember details. No major trouble with ADLs today. She will undergo MMSE and then we will follow up in 3-4 months

## 2017-09-05 NOTE — Patient Instructions (Signed)

## 2017-09-05 NOTE — Progress Notes (Signed)
Subjective:  I acted as a Education administrator for Dr. Charlett Blake. [name], [place]  Patient ID: Jackie Stevens, female    DOB: 1946/10/22, 71 y.o.   MRN: [id number]  No chief complaint on file.   HPI  Patient is in today for a follow up. She stated her blood pressure is elevated. She feels well today. No recent febrile illness or acute hospitalizations. She reports checking her blood pressure at home and noting it being elevated with the systolics frequenty in the 150s. Denies CP/palp/SOB/HA/congestion/fevers/GI or GU c/o. Taking meds as prescribed   Patient Care Team: Mosie Lukes, MD as PCP - General (Family Medicine) Marylynn Pearson, MD as Consulting Physician (Obstetrics and Gynecology) Marica Otter, [place] as Consulting Physician (Optometry) Reynold Bowen, MD as Consulting Physician (Endocrinology) Gaynelle Arabian, MD as Consulting Physician (Orthopedic Surgery) Meylor, Marlou Sa, [place] as Consulting Physician (Chiropractic Medicine) Sydnee Levans, MD as Consulting Physician (Dermatology)   Past Medical History:  Diagnosis Date  . Abnormal cervical cytology 10/25/2012   Follows with Dr Leavy Cella of Gyn  . Allergic rhinitis   . Anemia 10/06/2013  . Anginal pain ([place])    pt has history of CP states had cardiac workup with no specific issues identified   . Anxiety    when increased stress   . Arthritis    "knees; right thumb; shoulders" (09-Jan-2013)  . Asthma   . Chicken pox as a child  . Complication of anesthesia   . Constipation   . Dermatitis 03/06/2017  . Diverticulitis 10/25/2012   pt. reports that a drain was placed - 09/2012    . External hemorrhoid, bleeding    "sometimes" (01/09/2013)  . GERD (gastroesophageal reflux disease)   . H/O hiatal hernia   . Heart murmur   . HTN (hypertension)    stress test completed by Anselm Lis, diagnosed as GERD  . Hyperlipidemia   . Hypothyroidism   . Incontinence   . Insomnia   . Kidney stones 1970's   "passed on their own" (2013/01/09)    . Left shoulder pain 09/14/2016  . Low back pain 06/09/2014  . Measles as a child  . Medicare annual wellness visit, subsequent 10/06/2013   Steinhoffer of Dermatology Pneumovax in 2012   . Memory difficulties 09/05/2017  . Obesity 09/11/2017  . OSA on CPAP   . Paresthesia 03/23/2015   Left face  . PONV (postoperative nausea and vomiting)   . Preventative health care 10/06/2013   Steinhoffer of Dermatology Pneumovax in 2012  . Preventative health care 02/26/2016  . Shortness of breath dyspnea    using stairs  . Thyroid cancer ([place]) 1980's  . Thyroid disease   . UTI (urinary tract infection) 04/02/2013    Past Surgical History:  Procedure Laterality Date  . CHOLECYSTECTOMY  1990  . COLON SURGERY    . COLOSTOMY REVISION  01/09/13   Procedure: COLON RESECTION SIGMOID;  Surgeon: Gwenyth Ober, MD;  Location: [facility];  Service: General;  Laterality: N/A;  . CYSTOSCOPY WITH STENT PLACEMENT  01-09-2013   Procedure: CYSTOSCOPY WITH STENT PLACEMENT;  Surgeon: Hanley Ben, MD;  Location: [facility];  Service: Urology;  Laterality: N/A;  . DILATION AND CURETTAGE OF UTERUS  1960's   "lots of them; had miscarriages" (09-Jan-2013)  . LYSIS OF ADHESION N/A 12/02/2015   Procedure: LAPAROSCOPIC LYSIS OF ADHESION;  Surgeon: Johnathan Hausen, MD;  Location: [facility];  Service: General;  Laterality: N/A;  . ROBOTIC ASSISTED BILATERAL SALPINGO OOPHERECTOMY Bilateral 12/02/2015  Procedure: XI ROBOTIC ASSISTED BILATERAL SALPINGO OOPHORECTOMY;  Surgeon: Everitt Amber, MD;  Location: [facility];  Service: Gynecology;  Laterality: Bilateral;  . SIGMOID RESECTION / RECTOPEXY  12/19/2012  . THYROIDECTOMY, PARTIAL  1988   "then did iodine to remove the rest" (12/19/2012)  . TONSILLECTOMY  1951?  . TRANSRECTAL DRAINAGE OF PELVIC ABSCESS  10/27/2012  . VAGINAL HYSTERECTOMY  1970's   "still have my ovaries" (12/19/2012)    Family History  Problem Relation Age of Onset  . Heart disease Father   . Pneumonia Father   . Hypertension  Father   . Hyperlipidemia Father   . Cancer Father        skin  . Stroke Father   . Alzheimer's disease Mother   . Heart disease Mother   . Emphysema Brother        marijuana and cigarettes  . Alcohol abuse Brother   . Hearing loss Brother   . Diabetes Maternal Grandmother   . Alzheimer's disease Paternal Grandmother   . Cancer Paternal Grandmother        lung?- smoker  . Hyperlipidemia Paternal Grandmother   . Heart attack Paternal Grandfather   . Alcohol abuse Paternal Grandfather   . Neurofibromatosis Son        schwanomatosis  . Neurofibromatosis Son        swanomatosis  . Cancer Paternal Aunt     Social History   Social History  . Marital status: Married    Spouse name: N/A  . Number of children: N/A  . Years of education: N/A   Occupational History  . Not on file.   Social History Main Topics  . Smoking status: Never Smoker  . Smokeless tobacco: Never Used  . Alcohol use No  . Drug use: No  . Sexual activity: No   Other Topics Concern  . Not on file   Social History Narrative   Married    children    Outpatient Medications Prior to Visit  Medication Sig Dispense Refill  . aspirin EC 81 MG tablet Take 81 mg by mouth daily. Reported on 12/19/2015    . cetirizine (ZYRTEC) 10 MG tablet Take 1 tablet (10 mg total) by mouth daily. (Patient taking differently: Take 10 mg by mouth daily as needed for allergies. ) 30 tablet 11  . docusate sodium (COLACE) 100 MG capsule Take 100 mg by mouth 2 (two) times daily. Reported on 03/30/2016    . fluticasone (FLONASE) 50 MCG/ACT nasal spray Place 2 sprays into both nostrils daily. 48 g 3  . gabapentin (NEURONTIN) 100 MG capsule TAKE 2 CAPSULES (200 MG TOTAL) BY MOUTH AT BEDTIME. 180 capsule 0  . hydrochlorothiazide (HYDRODIURIL) 25 MG tablet Take 1 tablet (25 mg total) by mouth daily. 90 tablet 1  . KRILL OIL PO Take 750 mg by mouth daily.     Marland Kitchen losartan (COZAAR) 100 MG tablet Take 1 tablet (100 mg total) by mouth daily.  90 tablet 1  . naproxen (NAPROSYN) 375 MG tablet Take 1 tablet (375 mg total) by mouth 2 (two) times daily with a meal. 180 tablet 1  . NITROSTAT 0.4 MG SL tablet PLACE 1 TABLET UNDER THE TONGUE EVERY 5 MINUTES AS NEEDED FOR CHEST PAIN 25 tablet 0  . omeprazole (PRILOSEC) 40 MG capsule TAKE 1 CAPSULE TWICE DAILY 60 capsule 5  . Polyethyl Glycol-Propyl Glycol 0.4-0.3 % GEL Apply 1 drop to eye 2 (two) times daily as needed (dry eyes).     Marland Kitchen  polyethylene glycol (MIRALAX / GLYCOLAX) packet Take 17 g by mouth daily.    . Probiotic Product (PROBIOTIC ADVANCED PO) Take 2 tablets by mouth at bedtime. Digestive Advantage    . psyllium (REGULOID) 0.52 G capsule Take 0.52 g by mouth at bedtime.    . ranitidine (ZANTAC) 300 MG tablet Take 1 tablet (300 mg total) by mouth at bedtime. 30 tablet 0  . simvastatin (ZOCOR) 40 MG tablet Take 1 tablet (40 mg total) by mouth at bedtime. 90 tablet 0  . sodium chloride (OCEAN) 0.65 % SOLN nasal spray Place 1 spray into both nostrils 2 (two) times daily as needed for congestion.     . traMADol (ULTRAM) 50 MG tablet Take 1 tablet (50 mg total) by mouth every 6 (six) hours as needed. 30 tablet 0  . TURMERIC PO Take 1 tablet by mouth daily. Reported on 12/19/2015    . Vitamin D, Cholecalciferol, 1000 UNITS CAPS Take 1 capsule by mouth daily.    Marland Kitchen amLODipine (NORVASC) 2.5 MG tablet Take 2.5 mg by mouth daily.  1  . levothyroxine (SYNTHROID, LEVOTHROID) 150 MCG tablet Take 1 tablet (150 mcg total) by mouth daily before breakfast. 30 tablet 3  . fluticasone furoate-vilanterol (BREO ELLIPTA) 200-25 MCG/INH AEPB Inhale 1 puff into the lungs daily. Rinse mouth (Patient not taking: Reported on 05/24/2017) 1 each 0   No facility-administered medications prior to visit.     Allergies  Allergen Reactions  . Neomycin-Bacitracin Zn-Polymyx Rash    Polysporin- is tolerated   . Niacin Other (See Comments) and Cough    "cough til I threw up" (12/19/2012)  . Ciprofloxacin Hives    Got  cipro and flagyl at same time, localized hives to IV arm  . Flagyl [Metronidazole] Hives    Got cipro and flagyl at same time, localized hives to IV arm    Review of Systems  Constitutional: Negative for fever and malaise/fatigue.  HENT: Negative for congestion.   Eyes: Negative for blurred vision.  Respiratory: Negative for shortness of breath.   Cardiovascular: Negative for chest pain, palpitations and leg swelling.  Gastrointestinal: Negative for abdominal pain, blood in stool and nausea.  Genitourinary: Negative for dysuria and frequency.  Musculoskeletal: Negative for falls.  Skin: Negative for rash.  Neurological: Negative for dizziness, loss of consciousness and headaches.  Endo/Heme/Allergies: Negative for environmental allergies.  Psychiatric/Behavioral: Negative for depression. The patient is not nervous/anxious.        Objective:    Physical Exam  Constitutional: She is oriented to person, place, and time. She appears well-developed and well-nourished. No distress.  HENT:  Head: Normocephalic and atraumatic.  Nose: Nose normal.  Eyes: Right eye exhibits no discharge. Left eye exhibits no discharge.  Neck: Normal range of motion. Neck supple.  Cardiovascular: Normal rate and regular rhythm.   No murmur heard. Pulmonary/Chest: Effort normal and breath sounds normal.  Abdominal: Soft. Bowel sounds are normal. There is no tenderness.  Musculoskeletal: She exhibits no edema.  Neurological: She is alert and oriented to person, place, and time.  Skin: Skin is warm and dry.  Psychiatric: She has a normal mood and affect.  Nursing note and vitals reviewed.   BP (!) 152/72 (BP Location: Left Arm, Patient Position: Sitting, Cuff Size: Normal)   Pulse 70   Temp 98.3 F (36.8 C) (Oral)   Resp 18   Wt 204 lb 12.8 oz (92.9 kg)   SpO2 99%   BMI 37.46 kg/m  Wt Readings from  Last 3 Encounters:  09/05/17 204 lb 12.8 oz (92.9 kg)  05/24/17 204 lb (92.5 kg)  03/01/17 207 lb  6.4 oz (94.1 kg)   BP Readings from Last 3 Encounters:  09/05/17 (!) 152/72  05/24/17 (!) 150/78  03/01/17 (!) 134/59     Immunization History  Administered Date(s) Administered  . Influenza Split 09/12/2012, 10/02/2015  . Influenza, High Dose Seasonal PF 09/14/2016, 09/05/2017  . Influenza,inj,Quad PF,6+ Mos 08/30/2013, 10/21/2014, 08/20/2015  . Pneumococcal Conjugate-13 07/19/2011, 11/01/2014  . Pneumococcal Polysaccharide-23 03/30/2016  . Tdap 10/02/2013  . Zoster 12/09/2008    Health Maintenance  Topic Date Due  . Hepatitis C Screening  09-Jan-1946  . MAMMOGRAM  08/31/2019  . TETANUS/TDAP  10/03/2023  . COLONOSCOPY  12/21/2023  . INFLUENZA VACCINE  Completed  . DEXA SCAN  Completed  . PNA vac Low Risk Adult  Completed    Lab Results  Component Value Date   WBC 7.3 09/05/2017   HGB 12.4 09/05/2017   HCT 37.9 09/05/2017   PLT 310.0 09/05/2017   GLUCOSE 92 09/05/2017   CHOL 143 09/05/2017   TRIG 100.0 09/05/2017   HDL 53.30 09/05/2017   LDLCALC 70 09/05/2017   ALT 15 09/05/2017   AST 19 09/05/2017   NA 133 (L) 09/05/2017   K 3.9 09/05/2017   CL 95 (L) 09/05/2017   CREATININE 0.68 09/05/2017   BUN 14 09/05/2017   CO2 32 09/05/2017   TSH 0.18 (L) 09/05/2017   INR 0.99 03/16/2015   HGBA1C 5.8 (H) 07/18/2011    Lab Results  Component Value Date   TSH 0.18 (L) 09/05/2017   Lab Results  Component Value Date   WBC 7.3 09/05/2017   HGB 12.4 09/05/2017   HCT 37.9 09/05/2017   MCV 87.8 09/05/2017   PLT 310.0 09/05/2017   Lab Results  Component Value Date   NA 133 (L) 09/05/2017   K 3.9 09/05/2017   CO2 32 09/05/2017   GLUCOSE 92 09/05/2017   BUN 14 09/05/2017   CREATININE 0.68 09/05/2017   BILITOT 0.4 09/05/2017   ALKPHOS 76 09/05/2017   AST 19 09/05/2017   ALT 15 09/05/2017   PROT 6.8 09/05/2017   ALBUMIN 4.1 09/05/2017   CALCIUM 9.0 09/05/2017   ANIONGAP 6 12/03/2015   GFR 90.13 09/05/2017   Lab Results  Component Value Date   CHOL 143  09/05/2017   Lab Results  Component Value Date   HDL 53.30 09/05/2017   Lab Results  Component Value Date   LDLCALC 70 09/05/2017   Lab Results  Component Value Date   TRIG 100.0 09/05/2017   Lab Results  Component Value Date   CHOLHDL 3 09/05/2017   Lab Results  Component Value Date   HGBA1C 5.8 (H) 07/18/2011         Assessment & Plan:   Problem List Items Addressed This Visit    Hyperlipidemia, mixed    Encouraged heart healthy diet, increase exercise, avoid trans fats, consider a krill oil cap daily      Relevant Medications   amLODipine (NORVASC) 2.5 MG tablet   Other Relevant Orders   Lipid panel (Completed)   Essential hypertension    Mildly elevated Amlodipine 2.5 mg po bid Encouraged heart healthy diet such as the DASH diet and exercise as tolerated.       Relevant Medications   amLODipine (NORVASC) 2.5 MG tablet   Other Relevant Orders   CBC (Completed)   Comprehensive metabolic panel (Completed)  TSH (Completed)   Vitamin B12 deficiency    Continue to supplement and monitor      Relevant Orders   Vitamin B12 (Completed)   Memory difficulties    Questions her inability to remember details. No major trouble with ADLs today. She will undergo MMSE and then we will follow up in 3-4 months      Obesity    Encouraged DASH diet, decrease po intake and increase exercise as tolerated. Needs 7-8 hours of sleep nightly. Avoid trans fats, eat small, frequent meals every 4-5 hours with lean proteins, complex carbs and healthy fats. Minimize simple carbs, consider bariatric referral       Other Visit Diagnoses    Needs flu shot    -  Primary   Relevant Orders   Flu vaccine HIGH DOSE PF (Fluzone High dose) (Completed)      I have discontinued Ms. Gallogly's fluticasone furoate-vilanterol and amLODipine. I am also having her start on amLODipine. Additionally, I am having her maintain her psyllium, cetirizine, KRILL OIL PO, TURMERIC PO, Polyethyl  Glycol-Propyl Glycol, sodium chloride, Probiotic Product (PROBIOTIC ADVANCED PO), Vitamin D (Cholecalciferol), docusate sodium, NITROSTAT, aspirin EC, polyethylene glycol, omeprazole, fluticasone, traMADol, losartan, hydrochlorothiazide, naproxen, gabapentin, simvastatin, and ranitidine.  Meds ordered this encounter  Medications  . amLODipine (NORVASC) 2.5 MG tablet    Sig: Take 1 tablet (2.5 mg total) by mouth 2 (two) times daily.    Dispense:  180 tablet    Refill:  1    CMA served as scribe during this visit. History, Physical and Plan performed by medical provider. Documentation and orders reviewed and attested to.  Penni Homans, MD

## 2017-09-06 ENCOUNTER — Encounter: Payer: Self-pay | Admitting: Family Medicine

## 2017-09-06 LAB — COMPREHENSIVE METABOLIC PANEL
ALT: 15 U/L (ref 0–35)
AST: 19 U/L (ref 0–37)
Albumin: 4.1 g/dL (ref 3.5–5.2)
Alkaline Phosphatase: 76 U/L (ref 39–117)
BUN: 14 mg/dL (ref 6–23)
CO2: 32 mEq/L (ref 19–32)
Calcium: 9 mg/dL (ref 8.4–10.5)
Chloride: 95 mEq/L — ABNORMAL LOW (ref 96–112)
Creatinine, Ser: 0.68 mg/dL (ref 0.40–1.20)
GFR: 90.13 mL/min (ref >60.00)
Glucose, Bld: 92 mg/dL (ref 70–99)
Potassium: 3.9 mEq/L (ref 3.5–5.1)
Sodium: 133 mEq/L — ABNORMAL LOW (ref 135–145)
Total Bilirubin: 0.4 mg/dL (ref 0.2–1.2)
Total Protein: 6.8 g/dL (ref 6.0–8.3)

## 2017-09-06 LAB — CBC
HCT: 37.9 % (ref 36.0–46.0)
Hemoglobin: 12.4 g/dL (ref 12.0–15.0)
MCHC: 32.6 g/dL (ref 30.0–36.0)
MCV: 87.8 fl (ref 78.0–100.0)
Platelets: 310 10*3/uL (ref 150.0–400.0)
RBC: 4.32 Mil/uL (ref 3.87–5.11)
RDW: 14 % (ref 11.5–15.5)
WBC: 7.3 10*3/uL (ref 4.0–10.5)

## 2017-09-06 LAB — LIPID PANEL
Cholesterol: 143 mg/dL (ref 0–200)
HDL: 53.3 mg/dL (ref >39.00)
LDL Cholesterol: 70 mg/dL (ref 0–99)
NonHDL: 89.93
Total CHOL/HDL Ratio: 3
Triglycerides: 100 mg/dL (ref 0.0–149.0)
VLDL: 20 mg/dL (ref 0.0–40.0)

## 2017-09-06 LAB — TSH: TSH: 0.18 u[IU]/mL — ABNORMAL LOW (ref 0.35–4.50)

## 2017-09-06 LAB — VITAMIN B12: Vitamin B-12: 1374 pg/mL — ABNORMAL HIGH (ref 211–911)

## 2017-09-07 ENCOUNTER — Telehealth: Payer: Self-pay | Admitting: Family Medicine

## 2017-09-07 NOTE — Telephone Encounter (Signed)
Pt called in to be she said that she misunderstood providers instruction on if she is suppose to still be taking amlodipine medication. I did advise that medication is still reflecting on chart and in PCP notes. Informed pt that due to medication instructions she is to take 2 times a day. Pt says that she is not sure of if she should just jump back into taking the medication 2wice a day considering that she completely stopped. She would like to know if it is okay?    Please advise.

## 2017-09-08 ENCOUNTER — Encounter: Payer: Self-pay | Admitting: Family Medicine

## 2017-09-08 NOTE — Telephone Encounter (Signed)
I sent a my chart message yesterday which suggested she start the Amlodipine 2.5 mg daily (not BID) then recheck BP in 1 mn with nurse visit

## 2017-09-08 NOTE — Telephone Encounter (Signed)
Please advise    PC 

## 2017-09-09 ENCOUNTER — Telehealth: Payer: Self-pay

## 2017-09-09 DIAGNOSIS — E538 Deficiency of other specified B group vitamins: Secondary | ICD-10-CM

## 2017-09-09 DIAGNOSIS — I10 Essential (primary) hypertension: Secondary | ICD-10-CM

## 2017-09-09 DIAGNOSIS — E039 Hypothyroidism, unspecified: Secondary | ICD-10-CM

## 2017-09-09 MED ORDER — LEVOTHYROXINE SODIUM 125 MCG PO TABS: 125.0000 ug | ORAL_TABLET | Freq: Every day | ORAL | 0 refills | Status: DC

## 2017-09-09 NOTE — Telephone Encounter (Signed)
Sent patient a Therapist, music for updates.   PC

## 2017-09-09 NOTE — Telephone Encounter (Signed)
-----   Message from Mosie Lukes, MD sent at 09/06/2017 12:40 PM EDT ----- Notify TSH is suppressed further we need to drop her Levothyroxine dose down from 150 to 125 mcg tabs, 1 tab daily disp #30 with 5 rf or 90 with 1 at patient discretion, recheck labs in 3 months. B12 is too high. Skip 2 doses of oral meds a week

## 2017-09-09 NOTE — Telephone Encounter (Signed)
LMOVM for pt to RTC to discuss new changes warranted by recent labs Will instruct to decrease Levothyroxine to 125mg  will fax in 6 months Also to skip 2 doses of B12 oral a week as the level is too high Created future order for lab for 3 months/will discuss/thx dmf

## 2017-09-09 NOTE — Telephone Encounter (Signed)
Called pt. LVM to make her aware to check her My-Chart messages.

## 2017-09-11 ENCOUNTER — Encounter: Payer: Self-pay | Admitting: Family Medicine

## 2017-09-11 DIAGNOSIS — E669 Obesity, unspecified: Secondary | ICD-10-CM | POA: Insufficient documentation

## 2017-09-11 HISTORY — DX: Obesity, unspecified: E66.9

## 2017-09-11 NOTE — Assessment & Plan Note (Signed)
Continue to supplement and monitor 

## 2017-09-11 NOTE — Assessment & Plan Note (Signed)
Encouraged DASH diet, decrease po intake and increase exercise as tolerated. Needs 7-8 hours of sleep nightly. Avoid trans fats, eat small, frequent meals every 4-5 hours with lean proteins, complex carbs and healthy fats. Minimize simple carbs, consider bariatric referral 

## 2017-09-14 ENCOUNTER — Telehealth: Payer: Self-pay | Admitting: *Deleted

## 2017-09-14 NOTE — Telephone Encounter (Signed)
Received Mammography & DEXA results from [facility]; forwarded to provider/SLS 10/03

## 2017-09-16 ENCOUNTER — Encounter: Payer: Self-pay | Admitting: Family Medicine

## 2017-09-20 ENCOUNTER — Other Ambulatory Visit: Payer: Self-pay | Admitting: Family Medicine

## 2017-09-20 DIAGNOSIS — R05 Cough: Secondary | ICD-10-CM

## 2017-09-20 DIAGNOSIS — K219 Gastro-esophageal reflux disease without esophagitis: Secondary | ICD-10-CM

## 2017-09-20 DIAGNOSIS — R059 Cough, unspecified: Secondary | ICD-10-CM

## 2017-09-20 DIAGNOSIS — J302 Other seasonal allergic rhinitis: Secondary | ICD-10-CM

## 2017-09-23 ENCOUNTER — Other Ambulatory Visit: Payer: Self-pay

## 2017-09-23 DIAGNOSIS — R05 Cough: Secondary | ICD-10-CM

## 2017-09-23 DIAGNOSIS — R059 Cough, unspecified: Secondary | ICD-10-CM

## 2017-09-23 DIAGNOSIS — J302 Other seasonal allergic rhinitis: Secondary | ICD-10-CM

## 2017-09-23 DIAGNOSIS — K219 Gastro-esophageal reflux disease without esophagitis: Secondary | ICD-10-CM

## 2017-09-23 MED ORDER — OMEPRAZOLE 40 MG PO CPDR: 40.0000 mg | DELAYED_RELEASE_CAPSULE | Freq: Two times a day (BID) | ORAL | 1 refills | Status: DC

## 2017-09-23 MED ORDER — RANITIDINE HCL 300 MG PO TABS: ORAL_TABLET | ORAL | 1 refills | Status: DC

## 2017-09-28 DIAGNOSIS — H5203 Hypermetropia, bilateral: Secondary | ICD-10-CM | POA: Diagnosis not present

## 2017-09-28 DIAGNOSIS — H524 Presbyopia: Secondary | ICD-10-CM | POA: Diagnosis not present

## 2017-09-28 DIAGNOSIS — H04123 Dry eye syndrome of bilateral lacrimal glands: Secondary | ICD-10-CM | POA: Diagnosis not present

## 2017-09-28 DIAGNOSIS — H2513 Age-related nuclear cataract, bilateral: Secondary | ICD-10-CM | POA: Diagnosis not present

## 2017-09-28 DIAGNOSIS — H52223 Regular astigmatism, bilateral: Secondary | ICD-10-CM | POA: Diagnosis not present

## 2017-10-04 ENCOUNTER — Ambulatory Visit (INDEPENDENT_AMBULATORY_CARE_PROVIDER_SITE_OTHER): Payer: PPO | Admitting: Family Medicine

## 2017-10-04 VITALS — BP 141/83 | HR 78

## 2017-10-04 DIAGNOSIS — I10 Essential (primary) hypertension: Secondary | ICD-10-CM

## 2017-10-04 NOTE — Progress Notes (Signed)
Pre visit review using our clinic tool,if applicable. No additional management support is needed unless otherwise documented below in the visit note.   Patient in for BP check per order from Dr. Charlett Blake dated 09/05/17.  Patient has not taking BP medications today. States she usually still gets dizzy when she goes shopping.  BP today = 141/83 P= 78  Per Dr. Charlett Blake patient to continue taking medications as ordered. Watch Sodium and Caffine intake.   Make sure BP medications are taken prior to next scheduled  appointment with provider in January.   Patient agreed.    Nurse blood pressure check note reviewed. Agree with documention and plan.

## 2017-10-20 ENCOUNTER — Ambulatory Visit: Payer: PPO | Admitting: Family Medicine

## 2017-10-20 ENCOUNTER — Encounter: Payer: Self-pay | Admitting: Family Medicine

## 2017-10-20 VITALS — BP 147/63 | HR 74 | Temp 98.8°F | Ht 62.0 in | Wt 206.0 lb

## 2017-10-20 DIAGNOSIS — J029 Acute pharyngitis, unspecified: Secondary | ICD-10-CM

## 2017-10-20 MED ORDER — DEXAMETHASONE 4 MG PO TABS: 4.0000 mg | ORAL_TABLET | Freq: Two times a day (BID) | ORAL | 0 refills | Status: DC

## 2017-10-20 NOTE — Patient Instructions (Addendum)
Continue to push fluids, practice good hand hygiene, and cover your mouth if you cough.  If you start having fevers, shaking or shortness of breath, seek immediate care.  Consider coming off of naproxen. Do not take it over the next 2 days with this other medication.  Air humidifier, salt water gargles, throat lozenges.

## 2017-10-20 NOTE — Progress Notes (Signed)
Pre visit review using our clinic review tool, if applicable. No additional management support is needed unless otherwise documented below in the visit note. 

## 2017-10-20 NOTE — Progress Notes (Signed)
Chief Complaint  Patient presents with  . Sore Throat    for 3 days    [name] here for URI complaints.  Duration: 3 days  Associated symptoms: sinus congestion, rhinorrhea, itchy watery eyes, sore throat and cough Denies: sinus pain, ear pain, ear drainage, shortness of breath, myalgia and fevers/rigors Treatment to date: salt water gargles Sick contacts: No  ROS:  Const: Denies fevers HEENT: As noted in HPI Lungs: No SOB  Past Medical History:  Diagnosis Date  . Abnormal cervical cytology 10/25/2012   Follows with Dr Leavy Cella of Gyn  . Allergic rhinitis   . Anemia 10/06/2013  . Anginal pain ([place])    pt has history of CP states had cardiac workup with no specific issues identified   . Anxiety    when increased stress   . Arthritis    "knees; right thumb; shoulders" (January 02, 2013)  . Asthma   . Chicken pox as a child  . Complication of anesthesia   . Constipation   . Dermatitis 03/06/2017  . Diverticulitis 10/25/2012   pt. reports that a drain was placed - 09/2012    . External hemorrhoid, bleeding    "sometimes" (01-02-2013)  . GERD (gastroesophageal reflux disease)   . H/O hiatal hernia   . Heart murmur   . HTN (hypertension)    stress test completed by Anselm Lis, diagnosed as GERD  . Hyperlipidemia   . Hypothyroidism   . Incontinence   . Insomnia   . Kidney stones 1970's   "passed on their own" (01/02/2013)  . Left shoulder pain 09/14/2016  . Low back pain 06/09/2014  . Measles as a child  . Medicare annual wellness visit, subsequent 10/06/2013   Steinhoffer of Dermatology Pneumovax in 2012   . Memory difficulties 09/05/2017  . Obesity 09/11/2017  . OSA on CPAP   . Paresthesia 03/23/2015   Left face  . PONV (postoperative nausea and vomiting)   . Preventative health care 10/06/2013   Steinhoffer of Dermatology Pneumovax in 2012  . Preventative health care 02/26/2016  . Shortness of breath dyspnea    using stairs  . Thyroid cancer ([place]) 1980's   . Thyroid disease   . UTI (urinary tract infection) 04/02/2013   Family History  Problem Relation Age of Onset  . Heart disease Father   . Pneumonia Father   . Hypertension Father   . Hyperlipidemia Father   . Cancer Father        skin  . Stroke Father   . Alzheimer's disease Mother   . Heart disease Mother   . Emphysema Brother        marijuana and cigarettes  . Alcohol abuse Brother   . Hearing loss Brother   . Diabetes Maternal Grandmother   . Alzheimer's disease Paternal Grandmother   . Cancer Paternal Grandmother        lung?- smoker  . Hyperlipidemia Paternal Grandmother   . Heart attack Paternal Grandfather   . Alcohol abuse Paternal Grandfather   . Neurofibromatosis Son        schwanomatosis  . Neurofibromatosis Son        swanomatosis  . Cancer Paternal Aunt     BP (!) 147/63 (BP Location: Left Arm, Patient Position: Sitting, Cuff Size: Large)   Pulse 74   Temp 98.8 F (37.1 C) (Oral)   Ht 5\' 2"  (1.575 m)   Wt 206 lb (93.4 kg)   SpO2 98%   BMI 37.68 kg/m  General:  Awake, alert, appears stated age HEENT: AT, [place], ears patent b/l and TM's neg, nares patent w/o discharge, pharynx pink and without exudates, MMM Neck: No masses or asymmetry Heart: RRR, no murmurs, no bruits Lungs: CTAB, no accessory muscle use Psych: Age appropriate judgment and insight, normal mood and affect  Viral pharyngitis  0/4 centor criteria. No signs of bact infection.  Stop naproxen.  One-time dose of dexamethasone.  Continue salt water gargles, throat lozenges, air humidifier. Continue to push fluids, practice good hand hygiene, cover mouth when coughing. F/u prn. If starting to experience fevers, shaking, or shortness of breath, seek immediate care. Pt voiced understanding and agreement to the plan.  [place], DO 10/20/17 4:53 PM

## 2017-10-26 ENCOUNTER — Other Ambulatory Visit: Payer: Self-pay

## 2017-11-01 ENCOUNTER — Other Ambulatory Visit: Payer: Self-pay

## 2017-11-01 MED ORDER — HYDROCHLOROTHIAZIDE 25 MG PO TABS: 25.0000 mg | ORAL_TABLET | Freq: Every day | ORAL | 1 refills | Status: DC

## 2017-11-01 MED ORDER — NAPROXEN 375 MG PO TABS: 375.0000 mg | ORAL_TABLET | Freq: Two times a day (BID) | ORAL | 1 refills | Status: DC

## 2017-11-15 ENCOUNTER — Other Ambulatory Visit: Payer: Self-pay | Admitting: Family Medicine

## 2017-11-16 ENCOUNTER — Other Ambulatory Visit: Payer: Self-pay | Admitting: Family Medicine

## 2017-11-28 DIAGNOSIS — G4733 Obstructive sleep apnea (adult) (pediatric): Secondary | ICD-10-CM | POA: Diagnosis not present

## 2017-12-02 ENCOUNTER — Other Ambulatory Visit: Payer: Self-pay

## 2017-12-02 MED ORDER — AMLODIPINE BESYLATE 2.5 MG PO TABS: 2.5000 mg | ORAL_TABLET | Freq: Two times a day (BID) | ORAL | 1 refills | Status: DC

## 2017-12-08 ENCOUNTER — Other Ambulatory Visit: Payer: Self-pay

## 2017-12-08 MED ORDER — LEVOTHYROXINE SODIUM 125 MCG PO TABS: 125.0000 ug | ORAL_TABLET | Freq: Every day | ORAL | 0 refills | Status: DC

## 2017-12-09 ENCOUNTER — Telehealth: Payer: Self-pay | Admitting: Family Medicine

## 2017-12-09 DIAGNOSIS — E039 Hypothyroidism, unspecified: Secondary | ICD-10-CM

## 2017-12-09 DIAGNOSIS — E538 Deficiency of other specified B group vitamins: Secondary | ICD-10-CM

## 2017-12-09 DIAGNOSIS — E782 Mixed hyperlipidemia: Secondary | ICD-10-CM

## 2017-12-09 NOTE — Telephone Encounter (Signed)
Please order her labs for prior to her CPE Vitamin B12 for deficiency, TSH for hypothyroidism, cmp, cbc for HTN and lipid panel fo rhyperlipidemia.

## 2017-12-09 NOTE — Telephone Encounter (Signed)
Copied from [place] [id number]. Topic: Quick Communication - See Telephone Encounter >> Dec 09, 2017 12:56 PM Boyd Kerbs wrote: CRM for notification. See Telephone encounter for:   Patient set up CPE on 3/22 but will need order for labs  12/09/17.

## 2017-12-09 NOTE — Telephone Encounter (Signed)
Lab appt made for 03/15.

## 2017-12-09 NOTE — Telephone Encounter (Signed)
Please advise 

## 2017-12-09 NOTE — Telephone Encounter (Signed)
Orders placed- Shanea- please call Pt to schedule lab appt to be completed several days before her physical w/ Dr. Charlett Blake- she does need to be fasting.

## 2017-12-14 ENCOUNTER — Other Ambulatory Visit (INDEPENDENT_AMBULATORY_CARE_PROVIDER_SITE_OTHER): Payer: PPO

## 2017-12-14 DIAGNOSIS — E782 Mixed hyperlipidemia: Secondary | ICD-10-CM

## 2017-12-14 DIAGNOSIS — E538 Deficiency of other specified B group vitamins: Secondary | ICD-10-CM

## 2017-12-14 DIAGNOSIS — E039 Hypothyroidism, unspecified: Secondary | ICD-10-CM | POA: Diagnosis not present

## 2017-12-14 LAB — CBC WITH DIFFERENTIAL/PLATELET
Basophils Absolute: 0 10*3/uL (ref 0.0–0.1)
Basophils Relative: 0.5 % (ref 0.0–3.0)
Eosinophils Absolute: 0.4 10*3/uL (ref 0.0–0.7)
Eosinophils Relative: 5.4 % — ABNORMAL HIGH (ref 0.0–5.0)
HCT: 38.8 % (ref 36.0–46.0)
Hemoglobin: 12.9 g/dL (ref 12.0–15.0)
Lymphocytes Relative: 22.2 % (ref 12.0–46.0)
Lymphs Abs: 1.5 10*3/uL (ref 0.7–4.0)
MCHC: 33.2 g/dL (ref 30.0–36.0)
MCV: 87.1 fl (ref 78.0–100.0)
Monocytes Absolute: 0.5 10*3/uL (ref 0.1–1.0)
Monocytes Relative: 8.2 % (ref 3.0–12.0)
Neutro Abs: 4.2 10*3/uL (ref 1.4–7.7)
Neutrophils Relative %: 63.7 % (ref 43.0–77.0)
Platelets: 288 10*3/uL (ref 150.0–400.0)
RBC: 4.45 Mil/uL (ref 3.87–5.11)
RDW: 13.5 % (ref 11.5–15.5)
WBC: 6.6 10*3/uL (ref 4.0–10.5)

## 2017-12-14 LAB — COMPREHENSIVE METABOLIC PANEL
ALT: 17 U/L (ref 0–35)
AST: 18 U/L (ref 0–37)
Albumin: 4.1 g/dL (ref 3.5–5.2)
Alkaline Phosphatase: 84 U/L (ref 39–117)
BUN: 17 mg/dL (ref 6–23)
CO2: 27 mEq/L (ref 19–32)
Calcium: 8.7 mg/dL (ref 8.4–10.5)
Chloride: 96 mEq/L (ref 96–112)
Creatinine, Ser: 0.67 mg/dL (ref 0.40–1.20)
GFR: 91.61 mL/min (ref >60.00)
Glucose, Bld: 94 mg/dL (ref 70–99)
Potassium: 3.9 mEq/L (ref 3.5–5.1)
Sodium: 134 mEq/L — ABNORMAL LOW (ref 135–145)
Total Bilirubin: 0.5 mg/dL (ref 0.2–1.2)
Total Protein: 6.9 g/dL (ref 6.0–8.3)

## 2017-12-14 LAB — LIPID PANEL
Cholesterol: 141 mg/dL (ref 0–200)
HDL: 46.6 mg/dL (ref >39.00)
LDL Cholesterol: 73 mg/dL (ref 0–99)
NonHDL: 94.57
Total CHOL/HDL Ratio: 3
Triglycerides: 107 mg/dL (ref 0.0–149.0)
VLDL: 21.4 mg/dL (ref 0.0–40.0)

## 2017-12-14 LAB — TSH: TSH: 0.4 u[IU]/mL (ref 0.35–4.50)

## 2017-12-14 LAB — VITAMIN B12: Vitamin B-12: 647 pg/mL (ref 211–911)

## 2017-12-22 ENCOUNTER — Ambulatory Visit: Payer: PPO | Admitting: Internal Medicine

## 2017-12-22 ENCOUNTER — Encounter: Payer: Self-pay | Admitting: Internal Medicine

## 2017-12-22 VITALS — BP 124/78 | HR 90 | Ht 62.0 in | Wt 207.0 lb

## 2017-12-22 DIAGNOSIS — G4733 Obstructive sleep apnea (adult) (pediatric): Secondary | ICD-10-CM

## 2017-12-22 NOTE — Patient Instructions (Signed)
Order- DME APS Continue CPAP 10, mask of choice, humidifier, supplies, AirView            Please replace her missing download card or install AirView  Please call us as needed

## 2017-12-22 NOTE — Progress Notes (Signed)
Subjective:    Patient ID: Jackie Stevens, female    DOB: 02-01-1946, 72 y.o.   MRN: [id number]  HPI followed for obstructive sleep apnea complicated by GERD, HBP, allergic rhinitis, obesity NPSG  07/15/10--AHI 13.5/hour, desaturation to 87%, body weight 215 pounds PSG   ---------------------------------------------------------------------------------  03/02/2016-72 year old female never smoker followed for OSA, complicated by chronic cough, GERD, HBP, allergic rhinitis CPAP 10/APS FOLLOWS FOR: DME: APS. Pt wears CPAP but has had some issues with weaing machine; caught flu in March and since then has had cough/gagging. Pt has to take machine off and sleeps in chair without CPAP on. Cough has persisted since a flu-like illness in March, worse lying down. Says exam by ENT was negative. Her PCP treated with Prilosec 40 mg twice a day with no benefit. She has raised head of bed with bricks. Saw GI Dr Benson Norway who did not feel she needed EGD  12/22/17-72 year old female never smoker followed for OSA, complicated by chronic cough, GERD, HBP, allergic rhinitis CPAP 10/APS ----OSA; DME APS. Pt wears CPAP nightly; was never given a SD card back from DME.  Discussed alternatives to CPAP.  She had questions about appliances and the [facility] surgical procedure. No download is available this visit but she reports using CPAP all night every night and sleeping better with it.  Review of Systems-see HPI + = positive Constitutional:   No-   weight loss, night sweats, fevers, chills, fatigue, lassitude. HEENT:   No-  headaches, difficulty swallowing, tooth/dental problems, sore throat,         No-sneezing, itching, ear ache,no- nasal congestion, post nasal drip,  CV:  No-   chest pain, orthopnea, PND, swelling in lower extremities, anasarca, dizziness, palpitations Resp: No-   shortness of breath with exertion or at rest.              No-   productive cough,  + non-productive cough,  No-  coughing up of blood.             No-   change in color of mucus.  No- wheezing.   Skin: No-   rash or lesions. GI:  No-   heartburn, indigestion, abdominal pain, nausea, vomiting,  GU:  MS:  No-   joint pain or swelling.   Neuro- nothing unusual Psych:  No- change in mood or affect. No depression or anxiety.  No memory loss.   Objective:   Physical Exam General- Alert, Oriented, Affect-appropriate, Distress- none acute, + Morbidly obese Skin- rash-none, lesions- none, excoriation- none Lymphadenopathy- none Head- atraumatic            Eyes- Gross vision intact, PERRLA, conjunctivae clear secretions            Ears- Hearing, canals slightly retracted, no fluid            Nose- Clear, No-Septal dev, mucus, polyps, erosion, perforation             Throat- Mallampati III-IV , mucosa clear- not red , drainage- none, tonsils- atrophic, not hoarse Neck- flexible , trachea midline, no stridor , thyroid nl, carotid no bruit Chest - symmetrical excursion , unlabored           Heart/CV- RRR , murmur+ 1/6 LUSB , no gallop  , no rub, nl s1 s2                           - JVD- none , edema- none, stasis changes-  none, varices- none           Lung- clear to P&A, wheeze- none, cough- none , dullness-none, rub- none           Chest wall-  Abd-  Br/ Gen/ Rectal- Not done, not indicated Extrem- cyanosis- none, clubbing, none, atrophy- none, strength- nl Neuro- grossly intact to observation   Assessment & Plan:

## 2017-12-24 NOTE — Assessment & Plan Note (Signed)
Her husband is immobilized by morbid obesity, implying a lifestyle that makes weight loss very difficult.

## 2017-12-24 NOTE — Assessment & Plan Note (Signed)
Compliant with CPAP and continues to benefit.  We are requesting replacement SD hard or Airview so we can download her machine again.

## 2017-12-26 ENCOUNTER — Ambulatory Visit (HOSPITAL_BASED_OUTPATIENT_CLINIC_OR_DEPARTMENT_OTHER)
Admission: RE | Admit: 2017-12-26 | Discharge: 2017-12-26 | Disposition: A | Discharge status: Home / Self Care | Payer: PPO | Source: Ambulatory Visit | Attending: Family Medicine | Admitting: Family Medicine

## 2017-12-26 ENCOUNTER — Encounter: Payer: Self-pay | Admitting: Family Medicine

## 2017-12-26 ENCOUNTER — Ambulatory Visit (INDEPENDENT_AMBULATORY_CARE_PROVIDER_SITE_OTHER): Payer: PPO | Admitting: Family Medicine

## 2017-12-26 DIAGNOSIS — K5900 Constipation, unspecified: Secondary | ICD-10-CM | POA: Diagnosis not present

## 2017-12-26 DIAGNOSIS — E538 Deficiency of other specified B group vitamins: Secondary | ICD-10-CM | POA: Diagnosis not present

## 2017-12-26 DIAGNOSIS — E871 Hypo-osmolality and hyponatremia: Secondary | ICD-10-CM

## 2017-12-26 DIAGNOSIS — M533 Sacrococcygeal disorders, not elsewhere classified: Secondary | ICD-10-CM | POA: Diagnosis not present

## 2017-12-26 DIAGNOSIS — M545 Low back pain: Secondary | ICD-10-CM | POA: Diagnosis not present

## 2017-12-26 DIAGNOSIS — M25512 Pain in left shoulder: Secondary | ICD-10-CM

## 2017-12-26 DIAGNOSIS — I10 Essential (primary) hypertension: Secondary | ICD-10-CM

## 2017-12-26 DIAGNOSIS — M4316 Spondylolisthesis, lumbar region: Secondary | ICD-10-CM | POA: Diagnosis not present

## 2017-12-26 NOTE — Assessment & Plan Note (Signed)
Encouraged moist heat and gentle stretching as tolerated. May try NSAIDs and prescription meds as directed and report if symptoms worsen or seek immediate care 

## 2017-12-26 NOTE — Assessment & Plan Note (Signed)
Encouraged increased hydration and fiber in diet. Daily probiotics. If bowels not moving can use MOM 2 tbls po in 4 oz of warm prune juice by mouth every 2-3 days. If no results then repeat in 4 hours with  Dulcolax suppository pr, may repeat again in 4 more hours as needed. Seek care if symptoms worsen. Consider daily Miralax and/or Dulcolax if symptoms persist. Try Miralax and Benefiber together twice a day

## 2017-12-26 NOTE — Assessment & Plan Note (Signed)
Has long history of decreased ROM and pain and crepitus in shoulder agrees to sports med consultation

## 2017-12-26 NOTE — Assessment & Plan Note (Signed)
Labs shows within normal limits

## 2017-12-26 NOTE — Assessment & Plan Note (Signed)
Asymptomatic, mild continue to monitor

## 2017-12-26 NOTE — Assessment & Plan Note (Signed)
Well controlled, no changes to meds. Encouraged heart healthy diet such as the DASH diet and exercise as tolerated.  °

## 2017-12-26 NOTE — Progress Notes (Signed)
Subjective:  I acted as a Education administrator for Dr. Charlett Blake. [name], [place]  Patient ID: Jackie Stevens, female    DOB: [birth year], 72 y.o.   MRN: [id number]  No chief complaint on file.   HPI  Patient is in today for a 4 month follow up on chronic medical concerns including obesity, peripheral neuropathy, hypothyroidism and more. She feels well today. No recent febrile illness or acute hospitalizations. Denies CP/palp/SOB/HA/congestion/fevers/GI or GU c/o. Taking meds as prescribed  Patient Care Team: Mosie Lukes, MD as PCP - General (Family Medicine) Marylynn Pearson, MD as Consulting Physician (Obstetrics and Gynecology) Marica Otter, [place] as Consulting Physician (Optometry) Reynold Bowen, MD as Consulting Physician (Endocrinology) Gaynelle Arabian, MD as Consulting Physician (Orthopedic Surgery) Meylor, Marlou Sa, [place] as Consulting Physician (Chiropractic Medicine) Sydnee Levans, MD as Consulting Physician (Dermatology)   Past Medical History:  Diagnosis Date  . Abnormal cervical cytology 10/25/2012   Follows with Dr Leavy Cella of Gyn  . Allergic rhinitis   . Anemia 10/06/2013  . Anginal pain ([place])    pt has history of CP states had cardiac workup with no specific issues identified   . Anxiety    when increased stress   . Arthritis    "knees; right thumb; shoulders" (2013/01/02)  . Asthma   . Chicken pox as a child  . Complication of anesthesia   . Constipation   . Dermatitis 03/06/2017  . Diverticulitis 10/25/2012   pt. reports that a drain was placed - 09/2012    . External hemorrhoid, bleeding    "sometimes" (01/02/13)  . GERD (gastroesophageal reflux disease)   . H/O hiatal hernia   . Heart murmur   . HTN (hypertension)    stress test completed by Anselm Lis, diagnosed as GERD  . Hyperlipidemia   . Hypothyroidism   . Incontinence   . Insomnia   . Kidney stones 1970's   "passed on their own" (02-Jan-2013)  . Left shoulder pain 09/14/2016  . Low back pain 06/09/2014  .  Measles as a child  . Medicare annual wellness visit, subsequent 10/06/2013   Steinhoffer of Dermatology Pneumovax in 2012   . Memory difficulties 09/05/2017  . Obesity 09/11/2017  . OSA on CPAP   . Paresthesia 03/23/2015   Left face  . PONV (postoperative nausea and vomiting)   . Preventative health care 10/06/2013   Steinhoffer of Dermatology Pneumovax in 2012  . Preventative health care 02/26/2016  . Shortness of breath dyspnea    using stairs  . Thyroid cancer ([place]) 1980's  . Thyroid disease   . UTI (urinary tract infection) 04/02/2013    Past Surgical History:  Procedure Laterality Date  . CHOLECYSTECTOMY  1990  . COLON SURGERY    . COLOSTOMY REVISION  January 02, 2013   Procedure: COLON RESECTION SIGMOID;  Surgeon: Gwenyth Ober, MD;  Location: [facility];  Service: General;  Laterality: N/A;  . CYSTOSCOPY WITH STENT PLACEMENT  01/02/2013   Procedure: CYSTOSCOPY WITH STENT PLACEMENT;  Surgeon: Hanley Ben, MD;  Location: [facility];  Service: Urology;  Laterality: N/A;  . DILATION AND CURETTAGE OF UTERUS  1960's   "lots of them; had miscarriages" (01/02/13)  . LYSIS OF ADHESION N/A 12/02/2015   Procedure: LAPAROSCOPIC LYSIS OF ADHESION;  Surgeon: Johnathan Hausen, MD;  Location: [facility];  Service: General;  Laterality: N/A;  . ROBOTIC ASSISTED BILATERAL SALPINGO OOPHERECTOMY Bilateral 12/02/2015   Procedure: XI ROBOTIC ASSISTED BILATERAL SALPINGO OOPHORECTOMY;  Surgeon: Everitt Amber, MD;  Location: [facility];  Service: Gynecology;  Laterality: Bilateral;  . SIGMOID RESECTION / RECTOPEXY  12/19/2012  . THYROIDECTOMY, PARTIAL  1988   "then did iodine to remove the rest" (12/19/2012)  . TONSILLECTOMY  1951?  . TRANSRECTAL DRAINAGE OF PELVIC ABSCESS  10/27/2012  . VAGINAL HYSTERECTOMY  1970's   "still have my ovaries" (12/19/2012)    Family History  Problem Relation Age of Onset  . Heart disease Father   . Pneumonia Father   . Hypertension Father   . Hyperlipidemia Father   . Cancer Father         skin  . Stroke Father   . Alzheimer's disease Mother   . Heart disease Mother   . Emphysema Brother        marijuana and cigarettes  . Alcohol abuse Brother   . Hearing loss Brother   . Diabetes Maternal Grandmother   . Alzheimer's disease Paternal Grandmother   . Cancer Paternal Grandmother        lung?- smoker  . Hyperlipidemia Paternal Grandmother   . Heart attack Paternal Grandfather   . Alcohol abuse Paternal Grandfather   . Neurofibromatosis Son        schwanomatosis  . Neurofibromatosis Son        swanomatosis  . Cancer Paternal Aunt     Social History   Socioeconomic History  . Marital status: Married    Spouse name: Not on file  . Number of children: Not on file  . Years of education: Not on file  . Highest education level: Not on file  Social Needs  . Financial resource strain: Not on file  . Food insecurity - worry: Not on file  . Food insecurity - inability: Not on file  . Transportation needs - medical: Not on file  . Transportation needs - non-medical: Not on file  Occupational History  . Not on file  Tobacco Use  . Smoking status: Never Smoker  . Smokeless tobacco: Never Used  Substance and Sexual Activity  . Alcohol use: No    Alcohol/week: 0.0 oz  . Drug use: No  . Sexual activity: No  Other Topics Concern  . Not on file  Social History Narrative   Married    children    Outpatient Medications Prior to Visit  Medication Sig Dispense Refill  . amLODipine (NORVASC) 2.5 MG tablet Take 1 tablet (2.5 mg total) by mouth 2 (two) times daily. 180 tablet 1  . aspirin EC 81 MG tablet Take 81 mg by mouth daily. Reported on 12/19/2015    . cetirizine (ZYRTEC) 10 MG tablet Take 1 tablet (10 mg total) by mouth daily. (Patient taking differently: Take 10 mg by mouth daily as needed for allergies. ) 30 tablet 11  . dexamethasone (DECADRON) 4 MG tablet Take 1 tablet (4 mg total) 2 (two) times daily with a meal by mouth. 2 tablet 0  . docusate sodium (COLACE)  100 MG capsule Take 100 mg by mouth 2 (two) times daily. Reported on 03/30/2016    . fluticasone (FLONASE) 50 MCG/ACT nasal spray Place 2 sprays into both nostrils daily. 48 g 3  . gabapentin (NEURONTIN) 100 MG capsule TAKE 2 CAPSULES (200 MG TOTAL) BY MOUTH AT BEDTIME. 180 capsule 0  . hydrochlorothiazide (HYDRODIURIL) 25 MG tablet Take 1 tablet (25 mg total) by mouth daily. 90 tablet 1  . KRILL OIL PO Take 750 mg by mouth daily.     Marland Kitchen levothyroxine (SYNTHROID, LEVOTHROID) 125 MCG tablet Take 1 tablet (125  mcg total) by mouth daily. 90 tablet 0  . losartan (COZAAR) 100 MG tablet Take 1 tablet (100 mg total) by mouth daily. 90 tablet 1  . naproxen (NAPROSYN) 375 MG tablet Take 1 tablet (375 mg total) by mouth 2 (two) times daily with a meal. 180 tablet 1  . NITROSTAT 0.4 MG SL tablet PLACE 1 TABLET UNDER THE TONGUE EVERY 5 MINUTES AS NEEDED FOR CHEST PAIN 25 tablet 0  . omeprazole (PRILOSEC) 40 MG capsule Take 1 capsule (40 mg total) by mouth 2 (two) times daily. 180 capsule 1  . Polyethyl Glycol-Propyl Glycol 0.4-0.3 % GEL Apply 1 drop to eye 2 (two) times daily as needed (dry eyes).     . polyethylene glycol (MIRALAX / GLYCOLAX) packet Take 17 g by mouth daily.    . Probiotic Product (PROBIOTIC ADVANCED PO) Take 2 tablets by mouth at bedtime. Digestive Advantage    . psyllium (REGULOID) 0.52 G capsule Take 0.52 g by mouth at bedtime.    . ranitidine (ZANTAC) 300 MG tablet TAKE 1 TABLET BY MOUTH EVERYDAY AT BEDTIME 90 tablet 1  . simvastatin (ZOCOR) 40 MG tablet TAKE 1 TABLET AT BEDTIME 90 tablet 0  . sodium chloride (OCEAN) 0.65 % SOLN nasal spray Place 1 spray into both nostrils 2 (two) times daily as needed for congestion.     . traMADol (ULTRAM) 50 MG tablet Take 1 tablet (50 mg total) by mouth every 6 (six) hours as needed. 30 tablet 0  . TURMERIC PO Take 1 tablet by mouth daily. Reported on 12/19/2015    . Vitamin D, Cholecalciferol, 1000 UNITS CAPS Take 1 capsule by mouth daily.     No  facility-administered medications prior to visit.     Allergies  Allergen Reactions  . Neomycin-Bacitracin Zn-Polymyx Rash    Polysporin- is tolerated   . Niacin Other (See Comments) and Cough    "cough til I threw up" (12/19/2012)  . Ciprofloxacin Hives    Got cipro and flagyl at same time, localized hives to IV arm  . Flagyl [Metronidazole] Hives    Got cipro and flagyl at same time, localized hives to IV arm    Review of Systems  Constitutional: Negative for fever and malaise/fatigue.  HENT: Negative for congestion.   Eyes: Negative for blurred vision.  Respiratory: Negative for cough and shortness of breath.   Cardiovascular: Negative for chest pain, palpitations and leg swelling.  Gastrointestinal: Negative for vomiting.  Musculoskeletal: Positive for back pain and joint pain.  Skin: Negative for rash.  Neurological: Negative for focal weakness, loss of consciousness and headaches.       Objective:    Physical Exam  Constitutional: She is oriented to person, place, and time. She appears well-developed and well-nourished. No distress.  HENT:  Head: Normocephalic and atraumatic.  Eyes: Conjunctivae are normal.  Neck: Normal range of motion. No thyromegaly present.  Cardiovascular: Normal rate and regular rhythm.  Pulmonary/Chest: Effort normal and breath sounds normal. She has no wheezes.  Abdominal: Soft. Bowel sounds are normal. There is no tenderness.  Musculoskeletal: Normal range of motion. She exhibits no edema or deformity.  Neurological: She is alert and oriented to person, place, and time.  Skin: Skin is warm and dry. She is not diaphoretic.  Psychiatric: She has a normal mood and affect.    BP 140/68 (BP Location: Left Arm, Patient Position: Sitting, Cuff Size: Normal)   Pulse 75   Temp 98.2 F (36.8 C) (Oral)  Resp 18   Wt 208 lb 6.4 oz (94.5 kg)   SpO2 98%   BMI 38.12 kg/m  Wt Readings from Last 3 Encounters:  12/26/17 208 lb 6.4 oz (94.5 kg)    12/22/17 207 lb (93.9 kg)  10/20/17 206 lb (93.4 kg)   BP Readings from Last 3 Encounters:  12/26/17 140/68  12/22/17 124/78  10/20/17 (!) 147/63     Immunization History  Administered Date(s) Administered  . Influenza Split 09/12/2012, 10/02/2015  . Influenza, High Dose Seasonal PF 09/14/2016, 09/05/2017  . Influenza,inj,Quad PF,6+ Mos 08/30/2013, 10/21/2014, 08/20/2015  . Pneumococcal Conjugate-13 07/19/2011, 11/01/2014  . Pneumococcal Polysaccharide-23 03/30/2016  . Tdap 10/02/2013  . Zoster 12/09/2008    Health Maintenance  Topic Date Due  . Hepatitis C Screening  December 06, 1946  . MAMMOGRAM  08/31/2019  . TETANUS/TDAP  10/03/2023  . COLONOSCOPY  12/21/2023  . INFLUENZA VACCINE  Completed  . DEXA SCAN  Completed  . PNA vac Low Risk Adult  Completed    Lab Results  Component Value Date   WBC 6.6 12/14/2017   HGB 12.9 12/14/2017   HCT 38.8 12/14/2017   PLT 288.0 12/14/2017   GLUCOSE 94 12/14/2017   CHOL 141 12/14/2017   TRIG 107.0 12/14/2017   HDL 46.60 12/14/2017   LDLCALC 73 12/14/2017   ALT 17 12/14/2017   AST 18 12/14/2017   NA 134 (L) 12/14/2017   K 3.9 12/14/2017   CL 96 12/14/2017   CREATININE 0.67 12/14/2017   BUN 17 12/14/2017   CO2 27 12/14/2017   TSH 0.40 12/14/2017   INR 0.99 03/16/2015   HGBA1C 5.8 (H) 07/18/2011    Lab Results  Component Value Date   TSH 0.40 12/14/2017   Lab Results  Component Value Date   WBC 6.6 12/14/2017   HGB 12.9 12/14/2017   HCT 38.8 12/14/2017   MCV 87.1 12/14/2017   PLT 288.0 12/14/2017   Lab Results  Component Value Date   NA 134 (L) 12/14/2017   K 3.9 12/14/2017   CO2 27 12/14/2017   GLUCOSE 94 12/14/2017   BUN 17 12/14/2017   CREATININE 0.67 12/14/2017   BILITOT 0.5 12/14/2017   ALKPHOS 84 12/14/2017   AST 18 12/14/2017   ALT 17 12/14/2017   PROT 6.9 12/14/2017   ALBUMIN 4.1 12/14/2017   CALCIUM 8.7 12/14/2017   ANIONGAP 6 12/03/2015   GFR 91.61 12/14/2017   Lab Results  Component Value  Date   CHOL 141 12/14/2017   Lab Results  Component Value Date   HDL 46.60 12/14/2017   Lab Results  Component Value Date   LDLCALC 73 12/14/2017   Lab Results  Component Value Date   TRIG 107.0 12/14/2017   Lab Results  Component Value Date   CHOLHDL 3 12/14/2017   Lab Results  Component Value Date   HGBA1C 5.8 (H) 07/18/2011         Assessment & Plan:   Problem List Items Addressed This Visit    Essential hypertension    Well controlled, no changes to meds. Encouraged heart healthy diet such as the DASH diet and exercise as tolerated.       Constipation    Encouraged increased hydration and fiber in diet. Daily probiotics. If bowels not moving can use MOM 2 tbls po in 4 oz of warm prune juice by mouth every 2-3 days. If no results then repeat in 4 hours with  Dulcolax suppository pr, may repeat again in 4 more hours  as needed. Seek care if symptoms worsen. Consider daily Miralax and/or Dulcolax if symptoms persist. Try Miralax and Benefiber together twice a day      Low back pain    Encouraged moist heat and gentle stretching as tolerated. May try NSAIDs and prescription meds as directed and report if symptoms worsen or seek immediate care      Relevant Orders   DG Sacrum/Coccyx (Completed)   Hyponatremia    Asymptomatic, mild continue to monitor      Vitamin B12 deficiency    Labs shows within normal limits      Left shoulder pain    Has long history of decreased ROM and pain and crepitus in shoulder agrees to sports med consultation      Relevant Orders   Ambulatory referral to Sports Medicine   Obesity    Encouraged DASH diet, decrease po intake and increase exercise as tolerated. Needs 7-8 hours of sleep nightly. Avoid trans fats, eat small, frequent meals every 4-5 hours with lean proteins, complex carbs and healthy fats. Minimize simple carbs         I am having Kazuko P. Holloway maintain her psyllium, cetirizine, KRILL OIL PO, TURMERIC PO,  Polyethyl Glycol-Propyl Glycol, sodium chloride, Probiotic Product (PROBIOTIC ADVANCED PO), Vitamin D (Cholecalciferol), docusate sodium, NITROSTAT, aspirin EC, polyethylene glycol, fluticasone, traMADol, losartan, gabapentin, omeprazole, ranitidine, dexamethasone, naproxen, hydrochlorothiazide, simvastatin, amLODipine, and levothyroxine.  No orders of the defined types were placed in this encounter.   CMA served as Education administrator during this visit. History, Physical and Plan performed by medical provider. Documentation and orders reviewed and attested to.  Penni Homans, MD

## 2017-12-26 NOTE — Assessment & Plan Note (Signed)
Encouraged DASH diet, decrease po intake and increase exercise as tolerated. Needs 7-8 hours of sleep nightly. Avoid trans fats, eat small, frequent meals every 4-5 hours with lean proteins, complex carbs and healthy fats. Minimize simple carbs 

## 2017-12-26 NOTE — Patient Instructions (Addendum)
Lidocaine gel   Drop your water consumption by 1 beverage daily  Xylitol gum can help  Shingrix is the new shingles shot, 2 shots over 2-6 months   Encouraged increased hydration and fiber in diet. Daily probiotics. If bowels not moving can use MOM 2 tbls po in 4 oz of warm prune juice by mouth every 2-3 days. If no results then repeat in 4 hours with  Dulcolax suppository pr, may repeat again in 4 more hours as needed. Seek care if symptoms worsen. Consider daily Miralax and/or Dulcolax if symptoms persist.   Try mixing Miralax and Benefiber together once to twice daily  DASH Eating Plan DASH stands for "Dietary Approaches to Stop Hypertension." The DASH eating plan is a healthy eating plan that has been shown to reduce high blood pressure (hypertension). It may also reduce your risk for type 2 diabetes, heart disease, and stroke. The DASH eating plan may also help with weight loss. What are tips for following this plan? General guidelines  Avoid eating more than 2,300 mg (milligrams) of salt (sodium) a day. If you have hypertension, you may need to reduce your sodium intake to 1,500 mg a day.  Limit alcohol intake to no more than 1 drink a day for nonpregnant women and 2 drinks a day for men. One drink equals 12 oz of beer, 5 oz of wine, or 1 oz of hard liquor.  Work with your health care provider to maintain a healthy body weight or to lose weight. Ask what an ideal weight is for you.  Get at least 30 minutes of exercise that causes your heart to beat faster (aerobic exercise) most days of the week. Activities may include walking, swimming, or biking.  Work with your health care provider or diet and nutrition specialist (dietitian) to adjust your eating plan to your individual calorie needs. Reading food labels  Check food labels for the amount of sodium per serving. Choose foods with less than 5 percent of the Daily Value of sodium. Generally, foods with less than 300 mg of sodium  per serving fit into this eating plan.  To find whole grains, look for the word "whole" as the first word in the ingredient list. Shopping  Buy products labeled as "low-sodium" or "no salt added."  Buy fresh foods. Avoid canned foods and premade or frozen meals. Cooking  Avoid adding salt when cooking. Use salt-free seasonings or herbs instead of table salt or sea salt. Check with your health care provider or pharmacist before using salt substitutes.  Do not fry foods. Cook foods using healthy methods such as baking, boiling, grilling, and broiling instead.  Cook with heart-healthy oils, such as olive, canola, soybean, or sunflower oil. Meal planning   Eat a balanced diet that includes: ? 5 or more servings of fruits and vegetables each day. At each meal, try to fill half of your plate with fruits and vegetables. ? Up to 6-8 servings of whole grains each day. ? Less than 6 oz of lean meat, poultry, or fish each day. A 3-oz serving of meat is about the same size as a deck of cards. One egg equals 1 oz. ? 2 servings of low-fat dairy each day. ? A serving of nuts, seeds, or beans 5 times each week. ? Heart-healthy fats. Healthy fats called Omega-3 fatty acids are found in foods such as flaxseeds and coldwater fish, like sardines, salmon, and mackerel.  Limit how much you eat of the following: ? Canned or  prepackaged foods. ? Food that is high in trans fat, such as fried foods. ? Food that is high in saturated fat, such as fatty meat. ? Sweets, desserts, sugary drinks, and other foods with added sugar. ? Full-fat dairy products.  Do not salt foods before eating.  Try to eat at least 2 vegetarian meals each week.  Eat more home-cooked food and less restaurant, buffet, and fast food.  When eating at a restaurant, ask that your food be prepared with less salt or no salt, if possible. What foods are recommended? The items listed may not be a complete list. Talk with your dietitian  about what dietary choices are best for you. Grains Whole-grain or whole-wheat bread. Whole-grain or whole-wheat pasta. Brown rice. [place]row. Bulgur. Whole-grain and low-sodium cereals. Pita bread. Low-fat, low-sodium crackers. Whole-wheat flour tortillas. Vegetables Fresh or frozen vegetables (raw, steamed, roasted, or grilled). Low-sodium or reduced-sodium tomato and vegetable juice. Low-sodium or reduced-sodium tomato sauce and tomato paste. Low-sodium or reduced-sodium canned vegetables. Fruits All fresh, dried, or frozen fruit. Canned fruit in natural juice (without added sugar). Meat and other protein foods Skinless chicken or [place]. Ground chicken or [place]. Pork with fat trimmed off. Fish and seafood. Egg whites. Dried beans, peas, or lentils. Unsalted nuts, nut butters, and seeds. Unsalted canned beans. Lean cuts of beef with fat trimmed off. Low-sodium, lean deli meat. Dairy Low-fat (1%) or fat-free (skim) milk. Fat-free, low-fat, or reduced-fat cheeses. Nonfat, low-sodium ricotta or cottage cheese. Low-fat or nonfat yogurt. Low-fat, low-sodium cheese. Fats and oils Soft margarine without trans fats. Vegetable oil. Low-fat, reduced-fat, or light mayonnaise and salad dressings (reduced-sodium). Canola, safflower, olive, soybean, and sunflower oils. Avocado. Seasoning and other foods Herbs. Spices. Seasoning mixes without salt. Unsalted popcorn and pretzels. Fat-free sweets. What foods are not recommended? The items listed may not be a complete list. Talk with your dietitian about what dietary choices are best for you. Grains Baked goods made with fat, such as croissants, muffins, or some breads. Dry pasta or rice meal packs. Vegetables Creamed or fried vegetables. Vegetables in a cheese sauce. Regular canned vegetables (not low-sodium or reduced-sodium). Regular canned tomato sauce and paste (not low-sodium or reduced-sodium). Regular tomato and vegetable juice (not low-sodium or  reduced-sodium). Angie Fava. Olives. Fruits Canned fruit in a light or heavy syrup. Fried fruit. Fruit in cream or butter sauce. Meat and other protein foods Fatty cuts of meat. Ribs. Fried meat. Berniece Salines. Sausage. Bologna and other processed lunch meats. Salami. Fatback. Hotdogs. Bratwurst. Salted nuts and seeds. Canned beans with added salt. Canned or smoked fish. Whole eggs or egg yolks. Chicken or [place] with skin. Dairy Whole or 2% milk, cream, and half-and-half. Whole or full-fat cream cheese. Whole-fat or sweetened yogurt. Full-fat cheese. Nondairy creamers. Whipped toppings. Processed cheese and cheese spreads. Fats and oils Butter. Stick margarine. Lard. Shortening. Ghee. Bacon fat. Tropical oils, such as coconut, palm kernel, or palm oil. Seasoning and other foods Salted popcorn and pretzels. Onion salt, garlic salt, seasoned salt, table salt, and sea salt. Worcestershire sauce. Tartar sauce. Barbecue sauce. Teriyaki sauce. Soy sauce, including reduced-sodium. Steak sauce. Canned and packaged gravies. Fish sauce. Oyster sauce. Cocktail sauce. Horseradish that you find on the shelf. Ketchup. Mustard. Meat flavorings and tenderizers. Bouillon cubes. Hot sauce and Tabasco sauce. Premade or packaged marinades. Premade or packaged taco seasonings. Relishes. Regular salad dressings. Where to find more information:  National Heart, Lung, and [place]: https://wilson-eaton.com/  American Heart Association: www.heart.org Summary  The DASH  eating plan is a healthy eating plan that has been shown to reduce high blood pressure (hypertension). It may also reduce your risk for type 2 diabetes, heart disease, and stroke.  With the DASH eating plan, you should limit salt (sodium) intake to 2,300 mg a day. If you have hypertension, you may need to reduce your sodium intake to 1,500 mg a day.  When on the DASH eating plan, aim to eat more fresh fruits and vegetables, whole grains, lean proteins, low-fat  dairy, and heart-healthy fats.  Work with your health care provider or diet and nutrition specialist (dietitian) to adjust your eating plan to your individual calorie needs. This information is not intended to replace advice given to you by your health care provider. Make sure you discuss any questions you have with your health care provider. Document Released: 11/18/2011 Document Revised: 11/22/2016 Document Reviewed: 11/22/2016 Elsevier Interactive Patient Education  [employer].

## 2017-12-30 DIAGNOSIS — M170 Bilateral primary osteoarthritis of knee: Secondary | ICD-10-CM | POA: Diagnosis not present

## 2017-12-30 DIAGNOSIS — M25562 Pain in left knee: Secondary | ICD-10-CM | POA: Diagnosis not present

## 2018-01-03 DIAGNOSIS — G4733 Obstructive sleep apnea (adult) (pediatric): Secondary | ICD-10-CM | POA: Diagnosis not present

## 2018-01-17 ENCOUNTER — Other Ambulatory Visit: Payer: Self-pay | Admitting: Family Medicine

## 2018-01-23 ENCOUNTER — Ambulatory Visit: Payer: Self-pay

## 2018-01-23 ENCOUNTER — Ambulatory Visit (INDEPENDENT_AMBULATORY_CARE_PROVIDER_SITE_OTHER): Payer: PPO | Admitting: Sports Medicine

## 2018-01-23 ENCOUNTER — Other Ambulatory Visit: Payer: Self-pay | Admitting: Family Medicine

## 2018-01-23 ENCOUNTER — Encounter: Payer: Self-pay | Admitting: Sports Medicine

## 2018-01-23 VITALS — BP 140/74 | HR 68 | Ht 62.0 in | Wt 207.6 lb

## 2018-01-23 DIAGNOSIS — M25512 Pain in left shoulder: Secondary | ICD-10-CM

## 2018-01-23 DIAGNOSIS — G8929 Other chronic pain: Secondary | ICD-10-CM

## 2018-01-23 DIAGNOSIS — M19012 Primary osteoarthritis, left shoulder: Secondary | ICD-10-CM

## 2018-01-23 NOTE — Progress Notes (Signed)
Jackie Stevens. Jackie Stevens, Jackie [facility] at [place] - 72 y.o. female MRN [id number]  Date of birth: December 06, 1946  Visit Date: 01/23/2018  PCP: Jackie Lukes, MD   Referred by: Jackie Lukes, MD   Scribe for today's visit: Wendy Poet, LAT, ATC     SUBJECTIVE:  Jackie Stevens is here for Initial Assessment (L shoulder pain) .  Referred by: Dr. Penni Stevens Her L shoulder pain symptoms INITIALLY: Began 6 months ago insidious onset  Described as 4-5/10 sharp pain, radiating to L upper arm Worsened with overhead and cross-body ROM Improved with nothing noted Additional associated symptoms include: radiating pain into the L upper arm, grinding sensation.  N/T in B hands noted.    At this time symptoms show no change compared to onset  She has tried [employer] w/ no relief.   ROS Denies night time disturbances. Denies fevers, chills, or night sweats. Denies unexplained weight loss. Reports personal history of cancer.  Thyroid cancer - approximately 20 years ago. Denies changes in bowel or bladder habits. Denies recent unreported falls. Reports new or worsening dyspnea or wheezing. Denies headaches or dizziness.  Reports numbness, tingling or weakness  In the extremities, in B hands Reports dizziness or presyncopal episodes - if she gets up too quickly Denies lower extremity edema     HISTORY & PERTINENT PRIOR DATA:  Prior History reviewed and updated per electronic medical record.  Significant history, findings, studies and interim changes include:  reports that  has never smoked. she has never used smokeless tobacco. No results for input(s): HGBA1C, LABURIC, CREATINE in the last 8760 hours. Pre-Visit Info 02/25/2016 1:43 PM  Medication: Reviewed & UTD with the patient.  Preferred Pharmacy and which med where: [id number] - [place], [place] - [address]  Allergies  verified: UTD  Immunization Status: Flu vaccine-- 10/02/15 Tdap-- 10/02/13 PNA-- 11/01/14 Shingles-- 12/09/08  A/P:  Changes to [place], [place] or Personal Hx: UTD Pap-- 07/22/15 w/ Dr. Marylynn Stevens at [facility]; negative MMG-- 08/04/15 w/ [facility]; bi-rads category 0-incomplete; need additional imaging evaluation Bone Density-- 08/08/15 w/ [facility]; L1-L2, L4 (T-score 3.7); normal; Right Neck (T-score 1.2); normal & Left Neck (T-score 0.6); normal CCS-- 12/20/13 w/ Dr. 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  Care Teams Updated: Dr. Marylynn Stevens - Gynecology  Dr. Reynold Bowen - Endocrinology  ED/Hospital/Urgent Care Visits: Per the patient, no recent visits to the ED/Hospital or Urgent Care.  To Discuss with Provider: No concerns at the time of call. Problem  Arthritis of Left Shoulder Region   X-ray of the left shoulder on 09/14/2016: Marked degenerative change with significant osteophytic spurring and subchondral sclerosis.  Loss of glenohumeral joint space.  Well-maintained subacromial space.  Injected 09/30/2016 Jackie Stevens .     OBJECTIVE:  VS:  HT:5\' 2"  (157.5 cm)   WT:207 lb 9.6 oz (94.2 kg)  BMI:37.96    BP:140/74  HR:68bpm  TEMP: ( )  RESP:98 %   PHYSICAL EXAM: Constitutional: WDWN, Non-toxic appearing. Psychiatric: Alert & appropriately interactive.  Not depressed or anxious appearing. Respiratory: No increased work of breathing.  Trachea Midline Eyes: Pupils are equal.  EOM intact without nystagmus.  No scleral icterus  NEUROVASCULAR exam: No  clubbing or cyanosis appreciated No significant venous stasis changes Capillary Refill: normal, less than 2 seconds   Left shoulder: Overall well aligned.  She does have a slight anterior carriage.  She has  limited overhead range of motion with abduction and forward flexion to 80 degrees only.  Positive Hawkins and Neer's.  Upper extremity bilateral radial pulses 2+/4.  No significant pain with arm squeeze test or brachial plexus squeeze.   No additional findings.   ASSESSMENT & PLAN:   1. Chronic left shoulder pain   2. Arthritis of left shoulder region    PLAN: Repeat intra-articular injection performed today.  She is seeing Dr. Tamala Stevens for this in the past unfortunately does not remember how she responded this or much from his appointment.  We will have her continue working with her arm and not exacerbating activities but I discussed the importance of not overdoing this.  Heat has needed.  Follow-up in 10 weeks for consideration of repeat injection  No problem-specific Assessment & Plan notes found for this encounter.   ++++++++++++++++++++++++++++++++++++++++++++ Orders & Meds: Orders Placed This Encounter  Procedures  . [place] MSK POCT ULTRASOUND    No orders of the defined types were placed in this encounter.   ++++++++++++++++++++++++++++++++++++++++++++ Follow-up: Return in about 10 weeks (around 04/03/2018).   Pertinent documentation may be included in additional procedure notes, imaging studies, problem based documentation and patient instructions. Please see these sections of the encounter for additional information regarding this visit. CMA/ATC served as Education administrator during this visit. History, Physical, and Plan performed by medical provider. Documentation and orders reviewed and attested to.      Jackie Stevens, [facility] Physician

## 2018-01-23 NOTE — Patient Instructions (Signed)

## 2018-01-23 NOTE — Procedures (Signed)
PROCEDURE NOTE:  Ultrasound Guided: Injection: Left shoulder Images were obtained and interpreted by myself, Teresa Coombs, DO  Images have been saved and stored to PACS system. Images obtained on: GE S7 Ultrasound machine  ULTRASOUND FINDINGS:  Moderate degenerative spurring glenohumeral joint  DESCRIPTION OF PROCEDURE:  The patient's clinical condition is marked by substantial pain and/or significant functional disability. Other conservative therapy has not provided relief, is contraindicated, or not appropriate. There is a reasonable likelihood that injection will significantly improve the patient's pain and/or functional impairment.  After discussing the risks, benefits and expected outcomes of the injection and all questions were reviewed and answered, the patient wished to undergo the above named procedure. Verbal consent was obtained.  The ultrasound was used to identify the target structure and adjacent neurovascular structures. The skin was then prepped in sterile fashion and the target structure was injected under direct visualization using sterile technique as below:   PREP: Alcohol, Ethel Chloride APPROACH: posterior, stopcock technique, 22g 3.5in. INJECTATE: 5cc: 1% lidocaine, 2cc: 0.5% marcaine, 2cc: 40mg /mL DepoMedrol   ASPIRATE: None   DRESSING: Band-Aid   Post procedural instructions including recommending icing and warning signs for infection were reviewed.   This procedure was well tolerated and there were no complications.     IMPRESSION: Succesful Ultrasound Guided: Injection

## 2018-01-24 NOTE — Telephone Encounter (Signed)
Refill denied. Pt has not been seen by dr [name] in over a year.

## 2018-01-27 ENCOUNTER — Other Ambulatory Visit: Payer: Self-pay | Admitting: Family Medicine

## 2018-01-27 ENCOUNTER — Telehealth: Payer: Self-pay | Admitting: Family Medicine

## 2018-01-27 NOTE — Telephone Encounter (Signed)
Copied from [place] [id number]. Topic: Quick Communication - Rx Refill/Question >> Jan 27, 2018  1:52 PM Jackie Stevens, NT wrote: Medication:  gabapentin (NEURONTIN)   Has the patient contacted their pharmacy? Yes.     (Agent: If no, request that the patient contact the pharmacy for the refill.)   [facility] [place] Pt states that Dr. Charlett Blake normally filled this for her but she contacted her office and the office stated that Dr. Paulla Fore would now have to be the physician to fill this medication for her.   Agent: Please be advised that RX refills may take up to 3 business days. We ask that you follow-up with your pharmacy.

## 2018-01-27 NOTE — Telephone Encounter (Signed)
Please advise 

## 2018-01-27 NOTE — Telephone Encounter (Signed)
Requesting refill of Neurontin  LOV 01/23/18 with Dr. Paulla Fore  [facility] 08/19/17  #[id number]  0 refills   [facility] [place]   Pt states that Dr. Charlett Blake normally filled this for her.  She contacted that office and they stated  Dr. Paulla Fore would now be the physician to fill this medication for her.

## 2018-01-27 NOTE — Telephone Encounter (Signed)
Refill denied. Pt has not been seen within a year.  

## 2018-01-27 NOTE — Telephone Encounter (Signed)
Spoke with husband and he advised that she has enough medication to last until Monday. Will check with Dr. Paulla Fore when he returns Monday.

## 2018-01-30 NOTE — Telephone Encounter (Signed)
Looks like Gabapentin was last prescribed by Dr. [name], will forward to Dr. Paulla Fore to advise if OK to refill Gabapentin 100 mg, take 2 tablets at bedtime.

## 2018-01-31 MED ORDER — GABAPENTIN 100 MG PO CAPS: 200.0000 mg | ORAL_CAPSULE | Freq: Every day | ORAL | 0 refills | Status: DC

## 2018-01-31 NOTE — Telephone Encounter (Signed)
rx has been sent 

## 2018-01-31 NOTE — Telephone Encounter (Signed)
Rx sent in to pharmacy. 

## 2018-01-31 NOTE — Telephone Encounter (Signed)
Called pt and advised that rx has been refilled.  

## 2018-02-14 ENCOUNTER — Other Ambulatory Visit: Payer: Self-pay | Admitting: Family Medicine

## 2018-02-15 DIAGNOSIS — M47816 Spondylosis without myelopathy or radiculopathy, lumbar region: Secondary | ICD-10-CM | POA: Diagnosis not present

## 2018-02-15 DIAGNOSIS — M9901 Segmental and somatic dysfunction of cervical region: Secondary | ICD-10-CM | POA: Diagnosis not present

## 2018-02-15 DIAGNOSIS — M4722 Other spondylosis with radiculopathy, cervical region: Secondary | ICD-10-CM | POA: Diagnosis not present

## 2018-02-15 DIAGNOSIS — M9903 Segmental and somatic dysfunction of lumbar region: Secondary | ICD-10-CM | POA: Diagnosis not present

## 2018-02-16 ENCOUNTER — Other Ambulatory Visit: Payer: Self-pay | Admitting: Family Medicine

## 2018-02-16 DIAGNOSIS — Z23 Encounter for immunization: Secondary | ICD-10-CM

## 2018-02-16 MED ORDER — ZOSTER VAC RECOMB ADJUVANTED 50 MCG/0.5ML IM SUSR: 0.5000 mL | Freq: Once | INTRAMUSCULAR | 1 refills | Status: AC

## 2018-02-20 DIAGNOSIS — M47816 Spondylosis without myelopathy or radiculopathy, lumbar region: Secondary | ICD-10-CM | POA: Diagnosis not present

## 2018-02-20 DIAGNOSIS — M9903 Segmental and somatic dysfunction of lumbar region: Secondary | ICD-10-CM | POA: Diagnosis not present

## 2018-02-20 DIAGNOSIS — M4722 Other spondylosis with radiculopathy, cervical region: Secondary | ICD-10-CM | POA: Diagnosis not present

## 2018-02-20 DIAGNOSIS — M9901 Segmental and somatic dysfunction of cervical region: Secondary | ICD-10-CM | POA: Diagnosis not present

## 2018-02-22 DIAGNOSIS — M47816 Spondylosis without myelopathy or radiculopathy, lumbar region: Secondary | ICD-10-CM | POA: Diagnosis not present

## 2018-02-22 DIAGNOSIS — M9903 Segmental and somatic dysfunction of lumbar region: Secondary | ICD-10-CM | POA: Diagnosis not present

## 2018-02-22 DIAGNOSIS — M9901 Segmental and somatic dysfunction of cervical region: Secondary | ICD-10-CM | POA: Diagnosis not present

## 2018-02-22 DIAGNOSIS — M4722 Other spondylosis with radiculopathy, cervical region: Secondary | ICD-10-CM | POA: Diagnosis not present

## 2018-02-23 ENCOUNTER — Encounter (INDEPENDENT_AMBULATORY_CARE_PROVIDER_SITE_OTHER): Payer: PPO

## 2018-02-24 ENCOUNTER — Other Ambulatory Visit (INDEPENDENT_AMBULATORY_CARE_PROVIDER_SITE_OTHER): Payer: PPO

## 2018-02-24 DIAGNOSIS — I10 Essential (primary) hypertension: Secondary | ICD-10-CM | POA: Diagnosis not present

## 2018-02-24 DIAGNOSIS — E782 Mixed hyperlipidemia: Secondary | ICD-10-CM | POA: Diagnosis not present

## 2018-02-24 DIAGNOSIS — E538 Deficiency of other specified B group vitamins: Secondary | ICD-10-CM

## 2018-02-24 DIAGNOSIS — E039 Hypothyroidism, unspecified: Secondary | ICD-10-CM | POA: Diagnosis not present

## 2018-02-24 LAB — COMPREHENSIVE METABOLIC PANEL
ALT: 19 U/L (ref 0–35)
AST: 17 U/L (ref 0–37)
Albumin: 3.9 g/dL (ref 3.5–5.2)
Alkaline Phosphatase: 76 U/L (ref 39–117)
BUN: 16 mg/dL (ref 6–23)
CO2: 32 mEq/L (ref 19–32)
Calcium: 8.8 mg/dL (ref 8.4–10.5)
Chloride: 99 mEq/L (ref 96–112)
Creatinine, Ser: 0.69 mg/dL (ref 0.40–1.20)
GFR: 88.51 mL/min (ref >60.00)
Glucose, Bld: 102 mg/dL — ABNORMAL HIGH (ref 70–99)
Potassium: 4 mEq/L (ref 3.5–5.1)
Sodium: 137 mEq/L (ref 135–145)
Total Bilirubin: 0.3 mg/dL (ref 0.2–1.2)
Total Protein: 6.4 g/dL (ref 6.0–8.3)

## 2018-02-24 LAB — CBC
HCT: 36.5 % (ref 36.0–46.0)
Hemoglobin: 12.3 g/dL (ref 12.0–15.0)
MCHC: 33.6 g/dL (ref 30.0–36.0)
MCV: 87.1 fl (ref 78.0–100.0)
Platelets: 281 10*3/uL (ref 150.0–400.0)
RBC: 4.19 Mil/uL (ref 3.87–5.11)
RDW: 13.7 % (ref 11.5–15.5)
WBC: 6.4 10*3/uL (ref 4.0–10.5)

## 2018-02-24 LAB — TSH: TSH: 0.49 u[IU]/mL (ref 0.35–4.50)

## 2018-02-24 LAB — VITAMIN B12: Vitamin B-12: 1185 pg/mL — ABNORMAL HIGH (ref 211–911)

## 2018-02-27 DIAGNOSIS — M9903 Segmental and somatic dysfunction of lumbar region: Secondary | ICD-10-CM | POA: Diagnosis not present

## 2018-02-27 DIAGNOSIS — M4722 Other spondylosis with radiculopathy, cervical region: Secondary | ICD-10-CM | POA: Diagnosis not present

## 2018-02-27 DIAGNOSIS — G4733 Obstructive sleep apnea (adult) (pediatric): Secondary | ICD-10-CM | POA: Diagnosis not present

## 2018-02-27 DIAGNOSIS — M9901 Segmental and somatic dysfunction of cervical region: Secondary | ICD-10-CM | POA: Diagnosis not present

## 2018-02-27 DIAGNOSIS — M47816 Spondylosis without myelopathy or radiculopathy, lumbar region: Secondary | ICD-10-CM | POA: Diagnosis not present

## 2018-02-28 ENCOUNTER — Ambulatory Visit (INDEPENDENT_AMBULATORY_CARE_PROVIDER_SITE_OTHER): Payer: PPO | Admitting: Family Medicine

## 2018-02-28 ENCOUNTER — Encounter (INDEPENDENT_AMBULATORY_CARE_PROVIDER_SITE_OTHER): Payer: Self-pay | Admitting: Family Medicine

## 2018-02-28 VITALS — BP 138/77 | HR 64 | Temp 98.1°F | Ht 61.0 in | Wt 204.0 lb

## 2018-02-28 DIAGNOSIS — R5383 Other fatigue: Secondary | ICD-10-CM | POA: Diagnosis not present

## 2018-02-28 DIAGNOSIS — N200 Calculus of kidney: Secondary | ICD-10-CM | POA: Diagnosis not present

## 2018-02-28 DIAGNOSIS — E871 Hypo-osmolality and hyponatremia: Secondary | ICD-10-CM | POA: Diagnosis not present

## 2018-02-28 DIAGNOSIS — R0602 Shortness of breath: Secondary | ICD-10-CM

## 2018-02-28 DIAGNOSIS — Z1331 Encounter for screening for depression: Secondary | ICD-10-CM | POA: Diagnosis not present

## 2018-02-28 DIAGNOSIS — Z6838 Body mass index (BMI) 38.0-38.9, adult: Secondary | ICD-10-CM | POA: Diagnosis not present

## 2018-02-28 DIAGNOSIS — I10 Essential (primary) hypertension: Secondary | ICD-10-CM | POA: Diagnosis not present

## 2018-02-28 DIAGNOSIS — D6489 Other specified anemias: Secondary | ICD-10-CM | POA: Diagnosis not present

## 2018-02-28 DIAGNOSIS — Z0289 Encounter for other administrative examinations: Secondary | ICD-10-CM

## 2018-02-28 DIAGNOSIS — G4730 Sleep apnea, unspecified: Secondary | ICD-10-CM | POA: Diagnosis not present

## 2018-02-28 DIAGNOSIS — C73 Malignant neoplasm of thyroid gland: Secondary | ICD-10-CM | POA: Diagnosis not present

## 2018-02-28 DIAGNOSIS — Z1389 Encounter for screening for other disorder: Secondary | ICD-10-CM | POA: Diagnosis not present

## 2018-02-28 DIAGNOSIS — E559 Vitamin D deficiency, unspecified: Secondary | ICD-10-CM | POA: Diagnosis not present

## 2018-02-28 DIAGNOSIS — E7849 Other hyperlipidemia: Secondary | ICD-10-CM | POA: Diagnosis not present

## 2018-02-28 DIAGNOSIS — E890 Postprocedural hypothyroidism: Secondary | ICD-10-CM | POA: Diagnosis not present

## 2018-02-28 HISTORY — DX: Vitamin D deficiency, unspecified: E55.9

## 2018-03-01 DIAGNOSIS — M9903 Segmental and somatic dysfunction of lumbar region: Secondary | ICD-10-CM | POA: Diagnosis not present

## 2018-03-01 DIAGNOSIS — M47816 Spondylosis without myelopathy or radiculopathy, lumbar region: Secondary | ICD-10-CM | POA: Diagnosis not present

## 2018-03-01 DIAGNOSIS — M4722 Other spondylosis with radiculopathy, cervical region: Secondary | ICD-10-CM | POA: Diagnosis not present

## 2018-03-01 DIAGNOSIS — M9901 Segmental and somatic dysfunction of cervical region: Secondary | ICD-10-CM | POA: Diagnosis not present

## 2018-03-01 LAB — VITAMIN D 25 HYDROXY (VIT D DEFICIENCY, FRACTURES): Vit D, 25-Hydroxy: 47.4 ng/mL (ref 30.0–100.0)

## 2018-03-01 LAB — INSULIN, RANDOM: INSULIN: 7.9 u[IU]/mL (ref 2.6–24.9)

## 2018-03-02 NOTE — Progress Notes (Signed)
.  Office: [phone]  /  Fax: [phone]   HPI:   Chief Complaint: OBESITY  Jackie Jackie Stevens (MR# [id number]) is a 72 y.o. female who presents on 02/28/2018 for obesity evaluation and treatment. Current BMI is Body mass index is 38.55 kg/m.Marland Kitchen Jackie Jackie Stevens has struggled with obesity for years and has been unsuccessful in either losing weight or maintaining long term weight loss. Jackie Jackie Stevens attended our information session and states she is currently in the action stage of change and ready to dedicate time achieving and maintaining a healthier weight.   Jackie Jackie Stevens's mobility is limited, she uses a walker to walk anything more than a short distance. She lost approximately 50 lbs with phen-fen years ago.  Jackie Jackie Stevens states her family eats meals together she thinks her family will eat healthier with  her she started gaining weight as she got older, had to move alot her heaviest weight ever was 206 lbs she has significant food cravings issues  she snacks frequently in the evenings she skips meals frequently she is frequently drinking liquids with calories she frequently makes poor food choices she has problems with excessive hunger  she frequently eats larger portions than normal  she struggles with emotional eating    Fatigue Jackie Jackie Stevens feels her energy is lower than it should be. This has worsened with weight gain and has not worsened recently. Jackie Jackie Stevens admits to daytime somnolence and  denies waking up still tired. Jackie Stevens has a history of obstructive sleep apnea, on CPAP. Patent has a history of symptoms of daytime fatigue. Jackie Stevens generally gets 7 hours of sleep per night, and states they generally have generally restful sleep. Snoring is not present. Apneic episodes are not present. Epworth Sleepiness Score is 4.  Dyspnea on exertion Jackie Jackie Stevens notes increasing shortness of breath with exercising and seems to be worsening over time with weight gain. She notes getting out of breath sooner with activity than she  used to. This has not gotten worse recently. Jackie Jackie Stevens denies orthopnea.  Vitamin D Deficiency Jackie Jackie Stevens has a diagnosis of vitamin D deficiency. She is on Vit D, no recent labs. She notes fatigue and denies nausea, vomiting or muscle weakness.  Hypertension Jackie Jackie Stevens is a 72 y.o. female with hypertension. Jackie Stevens is on amlodipine and losartan and her blood pressure is stable. She denies chest pain or headache. She is working weight loss to help control her blood pressure with the goal of decreasing her risk of heart attack and stroke. Jackie Stevens's blood pressure is currently controlled.  Shortness of Breath Jackie Jackie Stevens has a history of phen-fen use and sleep apnea with CPAP. Left ventricular hypertrophy on EKG and heart murmur. She has no history of an echocardiogram done.  Depression Screen Jackie Jackie Stevens Food and Jackie Stevens (modified PHQ-9) score was  Depression screen PHQ 2/9 02/28/2018  Decreased Interest 0  Down, Depressed, Hopeless 0  PHQ - 2 Score 0  Altered sleeping 1  Tired, decreased energy 1  Change in appetite 0  Feeling bad or failure about yourself  0  Trouble concentrating 2  Moving slowly or fidgety/restless 3  Suicidal thoughts 0  PHQ-9 Score 7  Difficult doing work/chores Somewhat difficult    ALLERGIES: Allergies  Allergen Reactions  . Neomycin-Bacitracin Zn-Polymyx Rash    Polysporin- is tolerated   . Niacin Other (See Comments) and Cough    "cough til I threw up" (12/19/2012)  . Ciprofloxacin Hives    Got cipro and flagyl at same time, localized hives to IV arm  . Flagyl [Metronidazole]  Hives    Got cipro and flagyl at same time, localized hives to IV arm    MEDICATIONS: Current Outpatient Medications on File Prior to Visit  Medication Sig Dispense Refill  . amLODipine (NORVASC) 2.5 MG tablet Take 1 tablet (2.5 mg total) by mouth 2 (two) times daily. 180 tablet 1  . aspirin EC 81 MG tablet Take 81 mg by mouth daily. Reported on 12/19/2015    . docusate sodium (COLACE) 100 MG  capsule Take 100 mg by mouth 2 (two) times daily. Reported on 03/30/2016    . fluticasone (FLONASE) 50 MCG/ACT nasal spray Place 2 sprays into both nostrils daily. 48 g 3  . gabapentin (NEURONTIN) 100 MG capsule Take 2 capsules (200 mg total) by mouth at bedtime. 180 capsule 0  . hydrochlorothiazide (HYDRODIURIL) 25 MG tablet Take 1 tablet (25 mg total) by mouth daily. 90 tablet 1  . KRILL OIL PO Take 750 mg by mouth daily.     Marland Kitchen levothyroxine (SYNTHROID, LEVOTHROID) 125 MCG tablet Take 1 tablet (125 mcg total) by mouth daily. 90 tablet 0  . losartan (COZAAR) 100 MG tablet TAKE 1 TABLET BY MOUTH EVERY DAY 90 tablet 1  . Multiple Vitamins-Minerals (MULTIVITAMIN WOMEN 50+ PO) Take 1 tablet by mouth daily.    . naproxen (NAPROSYN) 375 MG tablet Take 1 tablet (375 mg total) by mouth 2 (two) times daily with a meal. 180 tablet 1  . NITROSTAT 0.4 MG SL tablet PLACE 1 TABLET UNDER THE TONGUE EVERY 5 MINUTES AS NEEDED FOR CHEST PAIN 25 tablet 0  . omeprazole (PRILOSEC) 40 MG capsule Take 1 capsule (40 mg total) by mouth 2 (two) times daily. 180 capsule 1  . Polyethyl Glycol-Propyl Glycol 0.4-0.3 % GEL Apply 1 drop to eye 2 (two) times daily as needed (dry eyes).     . polyethylene glycol (MIRALAX / GLYCOLAX) packet Take 17 g by mouth daily.    . Probiotic Product (PROBIOTIC ADVANCED PO) Take 2 tablets by mouth at bedtime. Digestive Advantage    . psyllium (REGULOID) 0.52 G capsule Take 0.52 g by mouth at bedtime.    . ranitidine (ZANTAC) 300 MG tablet TAKE 1 TABLET BY MOUTH EVERYDAY AT BEDTIME 90 tablet 1  . simvastatin (ZOCOR) 40 MG tablet TAKE 1 TABLET BY MOUTH EVERYDAY AT BEDTIME 90 tablet 0  . sodium chloride (OCEAN) 0.65 % SOLN nasal spray Place 1 spray into both nostrils 2 (two) times daily as needed for congestion.     . TURMERIC PO Take 1 tablet by mouth daily. Reported on 12/19/2015    . vitamin B-12 (CYANOCOBALAMIN) 1000 MCG tablet Take 100 mcg by mouth every other day.    . Vitamin D,  Cholecalciferol, 1000 UNITS CAPS Take 1 capsule by mouth daily.     No current facility-administered medications on file prior to visit.     PAST MEDICAL HISTORY: Past Medical History:  Diagnosis Date  . Abnormal cervical cytology 10/25/2012   Follows with Dr Leavy Cella of Gyn  . Allergic rhinitis   . Anemia 10/06/2013  . Anginal pain ([place])    pt has history of CP states had cardiac workup with no specific issues identified   . Anxiety    when increased stress   . Arthritis    "knees; right thumb; shoulders" (12/19/2012)  . Asthma   . Chest pain   . Chicken pox as a child  . Complication of anesthesia   . Constipation   . Decreased hearing   .  Dermatitis 03/06/2017  . Diverticulitis 10/25/2012   pt. reports that a drain was placed - 09/2012    . Dry mouth   . Excessive thirst   . External hemorrhoid, bleeding    "sometimes" (Jan 04, 2013)  . Frequent urination   . GERD (gastroesophageal reflux disease)   . H/O hiatal hernia   . Hand tingling   . Heart murmur   . HTN (hypertension)    stress test completed by Anselm Lis, diagnosed as GERD  . Hyperlipidemia   . Hypothyroidism   . Incontinence   . Infertility, female   . Insomnia   . Joint pain   . Kidney stones 1970's   "passed on their own" (2013/01/04)  . Knee pain   . Left shoulder pain 09/14/2016  . Low back pain 06/09/2014  . Measles as a child  . Medicare annual wellness visit, subsequent 10/06/2013   Steinhoffer of Dermatology Pneumovax in 2012   . Memory difficulties 09/05/2017  . Obesity 09/11/2017  . OSA on CPAP   . Palpitations   . Paresthesia 03/23/2015   Left face  . PONV (postoperative nausea and vomiting)   . Preventative health care 10/06/2013   Steinhoffer of Dermatology Pneumovax in 2012  . Preventative health care 02/26/2016  . Rheumatoid arteritis   . Shortness of breath dyspnea    using stairs  . Sinus pain   . Sleep apnea   . Swelling of both lower extremities   . Thyroid cancer ([place])  1980's  . Thyroid disease   . Tinnitus   . UTI (urinary tract infection) 04/02/2013    PAST SURGICAL HISTORY: Past Surgical History:  Procedure Laterality Date  . CHOLECYSTECTOMY  1990  . COLON SURGERY    . COLOSTOMY REVISION  Jan 04, 2013   Procedure: COLON RESECTION SIGMOID;  Surgeon: Gwenyth Ober, MD;  Location: [facility];  Service: General;  Laterality: N/A;  . CYSTOSCOPY WITH STENT PLACEMENT  January 04, 2013   Procedure: CYSTOSCOPY WITH STENT PLACEMENT;  Surgeon: Hanley Ben, MD;  Location: [facility];  Service: Urology;  Laterality: N/A;  . DILATION AND CURETTAGE OF UTERUS  1960's   "lots of them; had miscarriages" (01-04-2013)  . LYSIS OF ADHESION N/A 12/02/2015   Procedure: LAPAROSCOPIC LYSIS OF ADHESION;  Surgeon: Johnathan Hausen, MD;  Location: [facility];  Service: General;  Laterality: N/A;  . ROBOTIC ASSISTED BILATERAL SALPINGO OOPHERECTOMY Bilateral 12/02/2015   Procedure: XI ROBOTIC ASSISTED BILATERAL SALPINGO OOPHORECTOMY;  Surgeon: Ever[place] Amber, MD;  Location: [facility];  Service: Gynecology;  Laterality: Bilateral;  . SIGMOID RESECTION / RECTOPEXY  04-Jan-2013  . THYROIDECTOMY, PARTIAL  1988   "then did iodine to remove the rest" (01/04/2013)  . TONSILLECTOMY  1951?  . TRANSRECTAL DRAINAGE OF PELVIC ABSCESS  10/27/2012  . VAGINAL HYSTERECTOMY  1970's   "still have my ovaries" (01/04/2013)    SOCIAL HISTORY: Social History   Tobacco Use  . Smoking status: Never Smoker  . Smokeless tobacco: Never Used  Substance Use Topics  . Alcohol use: No    Alcohol/week: 0.0 oz  . Drug use: No    FAMILY HISTORY: Family History  Problem Relation Age of Onset  . Heart disease Father   . Pneumonia Father   . Hypertension Father   . Hyperlipidemia Father   . Cancer Father        skin  . Stroke Father   . Alzheimer's disease Mother   . Heart disease Mother   . Depression Mother   . Emphysema Brother  marijuana and cigarettes  . Alcohol abuse Brother   . Hearing loss Brother   . Diabetes  Maternal Grandmother   . Alzheimer's disease Paternal Grandmother   . Cancer Paternal Grandmother        lung?- smoker  . Hyperlipidemia Paternal Grandmother   . Heart attack Paternal Grandfather   . Alcohol abuse Paternal Grandfather   . Neurofibromatosis Son        schwanomatosis  . Neurofibromatosis Son        swanomatosis  . Cancer Paternal Aunt     ROS: Review of Systems  Constitutional: Positive for chills and malaise/fatigue. Negative for weight loss.       + hot/cold intolerance  HENT: Positive for sinus pain and tinnitus.        + Decreased hearing  + Nasal stuffiness + Dry mouth  Eyes:       + Vision changes + Wears glasses or contacts + Floaters  Respiratory: Positive for shortness of breath.   Cardiovascular: Positive for palpitations. Negative for chest pain and orthopnea.       + Very cold feet or hands  Gastrointestinal: Positive for constipation. Negative for nausea and vomiting.  Genitourinary: Positive for frequency.  Musculoskeletal: Positive for back pain.       Negative muscle weakness + Muscle or joint pain  Skin: Positive for itching.       + Hair or nail changes  Neurological: Negative for headaches.  Endo/Heme/Allergies: Positive for polydipsia. Bruises/bleeds easily.  Psychiatric/Behavioral: Positive for depression. Negative for suicidal ideas.    PHYSICAL EXAM: Blood pressure 138/77, pulse 64, temperature 98.1 F (36.7 C), temperature source Oral, height 5\' 1"  (1.549 m), weight 204 lb (92.5 kg), SpO2 99 %. Body mass index is 38.55 kg/m. Physical Exam  Constitutional: She is oriented to person, place, and time. She appears well-developed and well-nourished.  HENT:  Head: Normocephalic and atraumatic.  Nose: Nose normal.  Eyes: EOM are normal. No scleral icterus.  Neck: Normal range of motion. Neck supple. No thyromegaly present.  Cardiovascular: Normal rate and regular rhythm.  Murmur (2/6 early systolic murmur) heard. Pulmonary/Chest:  Effort normal. No respiratory distress.  Abdominal: Soft. There is no tenderness.  + Obesity  Musculoskeletal:  Range of Motion normal in all 4 extremities Trace edema noted in bilateral lower extremities  Neurological: She is alert and oriented to person, place, and time. Coordination normal.  Skin: Skin is warm and dry.  Psychiatric: She has a normal Jackie Stevens and affect. Her behavior is normal.  Vitals reviewed.   RECENT LABS AND TESTS: BMET    Component Value Date/Time   NA 137 02/24/2018 0829   K 4.0 02/24/2018 0829   CL 99 02/24/2018 0829   CO2 32 02/24/2018 0829   GLUCOSE 102 (H) 02/24/2018 0829   BUN 16 02/24/2018 0829   CREATININE 0.69 02/24/2018 0829   CREATININE 0.75 05/28/2014 0836   CALCIUM 8.8 02/24/2018 0829   GFRNONAA >60 12/03/2015 0522   GFRAA >60 12/03/2015 0522   Lab Results  Component Value Date   HGBA1C 5.8 (H) 07/18/2011   Lab Results  Component Value Date   INSULIN 7.9 02/28/2018   CBC    Component Value Date/Time   WBC 6.4 02/24/2018 0829   RBC 4.19 02/24/2018 0829   HGB 12.3 02/24/2018 0829   HCT 36.5 02/24/2018 0829   PLT 281.0 02/24/2018 0829   MCV 87.1 02/24/2018 0829   MCH 28.7 12/03/2015 0522   MCHC 33.6 02/24/2018 0829  RDW 13.7 02/24/2018 0829   LYMPHSABS 1.5 12/14/2017 0935   MONOABS 0.5 12/14/2017 0935   EOSABS 0.4 12/14/2017 0935   BASOSABS 0.0 12/14/2017 0935   Iron/TIBC/Ferritin/ %Sat    Component Value Date/Time   IRON 33 09/15/2015 0618   TIBC 249 (L) 09/15/2015 0618   FERRITIN 52 09/15/2015 0618   IRONPCTSAT 13 09/15/2015 0618   IRONPCTSAT 22 07/02/2013 1056   Lipid Panel     Component Value Date/Time   CHOL 141 12/14/2017 0935   TRIG 107.0 12/14/2017 0935   HDL 46.60 12/14/2017 0935   CHOLHDL 3 12/14/2017 0935   VLDL 21.4 12/14/2017 0935   LDLCALC 73 12/14/2017 0935   Hepatic Function Panel     Component Value Date/Time   PROT 6.4 02/24/2018 0829   ALBUMIN 3.9 02/24/2018 0829   AST 17 02/24/2018 0829    ALT 19 02/24/2018 0829   ALKPHOS 76 02/24/2018 0829   BILITOT 0.3 02/24/2018 0829   BILIDIR 0.0 11/01/2014 1116   IBILI 0.3 05/28/2014 0836      Component Value Date/Time   TSH 0.49 02/24/2018 0829   Vitamin D No recent labs  ECG  shows NSR with a rate of 69 BPM INDIRECT CALORIMETER done today shows a VO2 of 143 and a REE of 993. Her calculated basal metabolic rate is 3570 thus her basal metabolic rate is worse than expected.    ASSESSMENT AND PLAN: Other fatigue - Plan: EKG 12-Lead, Insulin, random  Shortness of breath on exertion - Plan: ECHOCARDIOGRAM COMPLETE  Vitamin D deficiency - Plan: VITAMIN D 25 Hydroxy (Vit-D Deficiency, Fractures)  Essential hypertension  Depression screening  Class 2 severe obesity with serious comorbidity and body mass index (BMI) of 38.0 to 38.9 in adult, unspecified obesity type ([place])  PLAN:  Fatigue Wanona was informed that her fatigue may be related to obesity, depression or many other causes. Labs will be ordered, and in the meanwhile Jackie Jackie Stevens has agreed to work on diet, exercise and weight loss to help with fatigue. Proper sleep hygiene was discussed including the need for 7-8 hours of quality sleep each night. A sleep study was not ordered based on symptoms and Epworth score.  Dyspnea on exertion Gordana's shortness of breath appears to be obesity related and exercise induced. She has agreed to work on weight loss and gradually increase exercise to treat her exercise induced shortness of breath. If Taraji follows our instructions and loses weight without improvement of her shortness of breath, we will plan to refer to pulmonology. We will monitor this condition regularly. Jackie Jackie Stevens agrees to this plan.  Vitamin D Deficiency Angelle was informed that low vitamin D levels contributes to fatigue and are associated with obesity, breast, and colon cancer. Jackie Jackie Stevens agrees to continue taking Vit D and will follow up for routine testing of vitamin D, at  least 2-3 times per year. She was informed of the risk of over-replacement of vitamin D and agrees to not increase her dose unless she discusses this with [place] first. We will check labs and Jackie Jackie Stevens agrees to follow up with our clinic in 2 weeks.  Hypertension We discussed sodium restriction, working on healthy weight loss, and a regular exercise program as the means to achieve improved blood pressure control. Jackie Jackie Stevens agreed with this plan and agreed to follow up as directed. We will continue to monitor her blood pressure as well as her progress with the above lifestyle modifications. She will continue diet, exercise and continue her medications as prescribed  and will watch for signs of hypotension as she continues her lifestyle modifications. Jackie Jackie Stevens agrees to follow up with our clinic in 2 weeks and we will recheck blood pressure at that time.  Shortness of Breath We have sent a referral to [place] [facility] for an echocardiogram, no authorization required. Jackie Jackie Stevens agrees to follow up with our clinic in 2 weeks.  Depression Screen Jackie Jackie Stevens had a mildly positive depression screening. Depression is commonly associated with obesity and often results in emotional eating behaviors. We will monitor this closely and work on CBT to help improve the non-hunger eating patterns. Referral to Psychology may be required if no improvement is seen as she continues in our clinic.  Obesity Jackie Jackie Stevens is currently in the action stage of change and her goal is to continue with weight loss efforts She has agreed to follow the Category 1 plan Zaliyah has been instructed to work up to a goal of 150 minutes of combined cardio and strengthening exercise per week for weight loss and overall health benefits. We discussed the following Behavioral Modification Stratagies today: increasing lean protein intake, decreasing simple carbohydrates  and work on meal planning and easy cooking plans  Lore has agreed to follow up with our  clinic in 2 weeks. She was informed of the importance of frequent follow up visits to maximize her success with intensive lifestyle modifications for her multiple health conditions. She was informed we would discuss her lab results at her next visit unless there is a critical issue that needs to be addressed sooner. Jackie Jackie Stevens agreed to keep her next visit at the agreed upon time to discuss these results.    OBESITY BEHAVIORAL INTERVENTION VISIT  Today's visit was # 1 out of 22.  Starting weight: 204 lbs Starting date: 02/28/18 Today's weight : 204 lbs  Today's date: 02/28/2018 Total lbs lost to date: 0 (Patients must lose 7 lbs in the first 6 months to continue with counseling)   ASK: We discussed the diagnosis of obesity with Jackie Oddi today and [place] agreed to give [place] permission to discuss obesity behavioral modification therapy today.  ASSESS: Reagen has the diagnosis of obesity and her BMI today is 38.57 Soriya is in the action stage of change   ADVISE: Antoria was educated on the multiple health risks of obesity as well as the benefit of weight loss to improve her health. She was advised of the need for long term treatment and the importance of lifestyle modifications.  AGREE: Multiple dietary modification options and treatment options were discussed and  Korin agreed to the above obesity treatment plan.   I, Trixie Dredge, am acting as transcriptionist for Dennard Nip, MD  I have reviewed the above documentation for accuracy and completeness, and I agree with the above. -Dennard Nip, MD

## 2018-03-03 ENCOUNTER — Ambulatory Visit (INDEPENDENT_AMBULATORY_CARE_PROVIDER_SITE_OTHER): Payer: PPO | Admitting: Family Medicine

## 2018-03-03 VITALS — BP 122/70 | HR 68 | Temp 97.7°F | Resp 18 | Ht 62.0 in | Wt 205.8 lb

## 2018-03-03 DIAGNOSIS — E559 Vitamin D deficiency, unspecified: Secondary | ICD-10-CM | POA: Diagnosis not present

## 2018-03-03 DIAGNOSIS — L309 Dermatitis, unspecified: Secondary | ICD-10-CM | POA: Diagnosis not present

## 2018-03-03 DIAGNOSIS — I10 Essential (primary) hypertension: Secondary | ICD-10-CM | POA: Diagnosis not present

## 2018-03-03 DIAGNOSIS — E538 Deficiency of other specified B group vitamins: Secondary | ICD-10-CM

## 2018-03-03 DIAGNOSIS — E039 Hypothyroidism, unspecified: Secondary | ICD-10-CM | POA: Diagnosis not present

## 2018-03-03 DIAGNOSIS — Z0001 Encounter for general adult medical examination with abnormal findings: Secondary | ICD-10-CM

## 2018-03-03 DIAGNOSIS — E782 Mixed hyperlipidemia: Secondary | ICD-10-CM

## 2018-03-03 DIAGNOSIS — Z0000 Encounter for general adult medical examination without abnormal findings: Secondary | ICD-10-CM

## 2018-03-03 MED ORDER — KETOCONAZOLE 1 % EX SHAM: 1.0000 | MEDICATED_SHAMPOO | CUTANEOUS | 1 refills | Status: DC

## 2018-03-03 NOTE — Assessment & Plan Note (Signed)
Well controlled, no changes to meds. Encouraged heart healthy diet such as the DASH diet and exercise as tolerated.  °

## 2018-03-03 NOTE — Assessment & Plan Note (Signed)
Encouraged heart healthy diet, increase exercise, avoid trans fats, consider a krill oil cap daily 

## 2018-03-03 NOTE — Patient Instructions (Signed)
Preventive Care 74 Years and Older, Female Preventive care refers to lifestyle choices and visits with your health care provider that can promote health and wellness. What does preventive care include?  A yearly physical exam. This is also called an annual well check.  Dental exams once or twice a year.  Routine eye exams. Ask your health care provider how often you should have your eyes checked.  Personal lifestyle choices, including: ? Daily care of your teeth and gums. ? Regular physical activity. ? Eating a healthy diet. ? Avoiding tobacco and drug use. ? Limiting alcohol use. ? Practicing safe sex. ? Taking low-dose aspirin every day. ? Taking vitamin and mineral supplements as recommended by your health care provider. What happens during an annual well check? The services and screenings done by your health care provider during your annual well check will depend on your age, overall health, lifestyle risk factors, and family history of disease. Counseling Your health care provider may ask you questions about your:  Alcohol use.  Tobacco use.  Drug use.  Emotional well-being.  Home and relationship well-being.  Sexual activity.  Eating habits.  History of falls.  Memory and ability to understand (cognition).  Work and work environment.  Reproductive health.  Screening You may have the following tests or measurements:  Height, weight, and BMI.  Blood pressure.  Lipid and cholesterol levels. These may be checked every 5 years, or more frequently if you are over 50 years old.  Skin check.  Lung cancer screening. You may have this screening every year starting at age 55 if you have a 30-pack-year history of smoking and currently smoke or have quit within the past 15 years.  Fecal occult blood test (FOBT) of the stool. You may have this test every year starting at age 50.  Flexible sigmoidoscopy or colonoscopy. You may have a sigmoidoscopy every 5 years or  a colonoscopy every 10 years starting at age 50.  Hepatitis C blood test.  Hepatitis B blood test.  Sexually transmitted disease (STD) testing.  Diabetes screening. This is done by checking your blood sugar (glucose) after you have not eaten for a while (fasting). You may have this done every 1-3 years.  Bone density scan. This is done to screen for osteoporosis. You may have this done starting at age 65.  Mammogram. This may be done every 1-2 years. Talk to your health care provider about how often you should have regular mammograms.  Talk with your health care provider about your test results, treatment options, and if necessary, the need for more tests. Vaccines Your health care provider may recommend certain vaccines, such as:  Influenza vaccine. This is recommended every year.  Tetanus, diphtheria, and acellular pertussis (Tdap, Td) vaccine. You may need a Td booster every 10 years.  Varicella vaccine. You may need this if you have not been vaccinated.  Zoster vaccine. You may need this after age 60.  Measles, mumps, and rubella (MMR) vaccine. You may need at least one dose of MMR if you were born in 1957 or later. You may also need a second dose.  Pneumococcal 13-valent conjugate (PCV13) vaccine. One dose is recommended after age 65.  Pneumococcal polysaccharide (PPSV23) vaccine. One dose is recommended after age 65.  Meningococcal vaccine. You may need this if you have certain conditions.  Hepatitis A vaccine. You may need this if you have certain conditions or if you travel or work in places where you may be exposed to hepatitis   A.  Hepatitis B vaccine. You may need this if you have certain conditions or if you travel or work in places where you may be exposed to hepatitis B.  Haemophilus influenzae type b (Hib) vaccine. You may need this if you have certain conditions.  Talk to your health care provider about which screenings and vaccines you need and how often you  need them. This information is not intended to replace advice given to you by your health care provider. Make sure you discuss any questions you have with your health care provider. Document Released: 12/26/2015 Document Revised: 08/18/2016 Document Reviewed: 09/30/2015 Elsevier Interactive Patient Education  2018 [employer]  

## 2018-03-03 NOTE — Assessment & Plan Note (Signed)
On Levothyroxine, continue to monitor 

## 2018-03-03 NOTE — Progress Notes (Signed)
Subjective:  I acted as a Education administrator for Dr. Charlett Blake. [name], [place]  Patient ID: Jackie Stevens, female    DOB: 07-02-1946, 72 y.o.   MRN: [id number]  No chief complaint on file.   HPI  Patient is in today for an annual exam  And follow up on chronic medical concerns including hyperlipidemia, obesity, hypertension and hypothyroidism. She feels well today. She is complaining of several months of a mildly pruritic rash which is more symptomatic when she is warm. No recnet febrile illness or hospitalozaiton. She is doing well with her activities of daily living. She is trying to eat a heart healthy diet and has just started care with the bariatric program to loose weight. Denies CP/palp/SOB/HA/congestion/fevers/GI or GU c/o. Taking meds as prescribed Patient Care Team: Jackie Lukes, MD as PCP - General (Family Medicine) Jackie Pearson, MD as Consulting Physician (Obstetrics and Gynecology) Jackie Stevens, [place] as Consulting Physician (Optometry) Jackie Bowen, MD as Consulting Physician (Endocrinology) Jackie Arabian, MD as Consulting Physician (Orthopedic Surgery) Jackie Stevens, Jackie Stevens, Jackie Stevens as Consulting Physician (Chiropractic Medicine) Jackie Levans, MD as Consulting Physician (Dermatology)   Past Medical History:  Diagnosis Date  . Abnormal cervical cytology 10/25/2012   Follows with Dr Leavy Cella of Gyn  . Allergic rhinitis   . Anemia 10/06/2013  . Anginal pain ([place])    pt has history of CP states had cardiac workup with no specific issues identified   . Anxiety    when increased stress   . Arthritis    "knees; right thumb; shoulders" (2012/12/27)  . Asthma   . Chest pain   . Chicken pox as a child  . Complication of anesthesia   . Constipation   . Decreased hearing   . Dermatitis 03/06/2017  . Diverticulitis 10/25/2012   pt. reports that a drain was placed - 09/2012    . Dry mouth   . Excessive thirst   . External hemorrhoid, bleeding    "sometimes" (12-27-12)  . Frequent  urination   . GERD (gastroesophageal reflux disease)   . H/O hiatal hernia   . Hand tingling   . Heart murmur   . HTN (hypertension)    stress test completed by Anselm Lis, diagnosed as GERD  . Hyperlipidemia   . Hypothyroidism   . Incontinence   . Infertility, female   . Insomnia   . Joint pain   . Kidney stones 1970's   "passed on their own" (27-Dec-2012)  . Knee pain   . Left shoulder pain 09/14/2016  . Low back pain 06/09/2014  . Measles as a child  . Medicare annual wellness visit, subsequent 10/06/2013   Steinhoffer of Dermatology Pneumovax in 2012   . Memory difficulties 09/05/2017  . Obesity 09/11/2017  . OSA on CPAP   . Palpitations   . Paresthesia 03/23/2015   Left face  . PONV (postoperative nausea and vomiting)   . Preventative health care 10/06/2013   Steinhoffer of Dermatology Pneumovax in 2012  . Preventative health care 02/26/2016  . Rheumatoid arteritis   . Shortness of breath dyspnea    using stairs  . Sinus pain   . Sleep apnea   . Swelling of both lower extremities   . Thyroid cancer ([place]) 1980's  . Thyroid disease   . Tinnitus   . UTI (urinary tract infection) 04/02/2013    Past Surgical History:  Procedure Laterality Date  . CHOLECYSTECTOMY  1990  . COLON SURGERY    . COLOSTOMY REVISION  12/27/12  Procedure: COLON RESECTION SIGMOID;  Surgeon: Jackie Ober, MD;  Location: [facility];  Service: General;  Laterality: N/A;  . CYSTOSCOPY WITH STENT PLACEMENT  12/19/2012   Procedure: CYSTOSCOPY WITH STENT PLACEMENT;  Surgeon: Jackie Ben, MD;  Location: [facility];  Service: Urology;  Laterality: N/A;  . DILATION AND CURETTAGE OF UTERUS  1960's   "lots of them; had miscarriages" (12/19/2012)  . LYSIS OF ADHESION N/A 12/02/2015   Procedure: LAPAROSCOPIC LYSIS OF ADHESION;  Surgeon: Jackie Hausen, MD;  Location: [facility];  Service: General;  Laterality: N/A;  . ROBOTIC ASSISTED BILATERAL SALPINGO OOPHERECTOMY Bilateral 12/02/2015   Procedure: XI ROBOTIC  ASSISTED BILATERAL SALPINGO OOPHORECTOMY;  Surgeon: Jackie Amber, MD;  Location: [facility];  Service: Gynecology;  Laterality: Bilateral;  . SIGMOID RESECTION / RECTOPEXY  12/19/2012  . THYROIDECTOMY, PARTIAL  1988   "then did iodine to remove the rest" (12/19/2012)  . TONSILLECTOMY  1951?  . TRANSRECTAL DRAINAGE OF PELVIC ABSCESS  10/27/2012  . VAGINAL HYSTERECTOMY  1970's   "still have my ovaries" (12/19/2012)    Family History  Problem Relation Age of Onset  . Heart disease Father   . Pneumonia Father   . Hypertension Father   . Hyperlipidemia Father   . Cancer Father        skin  . Stroke Father   . Alzheimer's disease Mother   . Heart disease Mother   . Depression Mother   . Emphysema Brother        marijuana and cigarettes  . Alcohol abuse Brother   . Hearing loss Brother   . Diabetes Maternal Grandmother   . Alzheimer's disease Paternal Grandmother   . Cancer Paternal Grandmother        lung?- smoker  . Hyperlipidemia Paternal Grandmother   . Heart attack Paternal Grandfather   . Alcohol abuse Paternal Grandfather   . Neurofibromatosis Son        schwanomatosis  . Neurofibromatosis Son        swanomatosis  . Cancer Paternal Aunt     Social History   Socioeconomic History  . Marital status: Married    Spouse name: Jackie Stevens  . Number of children: 2  . Years of education: Not on file  . Highest education level: Not on file  Occupational History  . Occupation: Retired  Scientific laboratory technician  . Financial resource strain: Not on file  . Food insecurity:    Worry: Not on file    Inability: Not on file  . Transportation needs:    Medical: Not on file    Non-medical: Not on file  Tobacco Use  . Smoking status: Never Smoker  . Smokeless tobacco: Never Used  Substance and Sexual Activity  . Alcohol use: No    Alcohol/week: 0.0 oz  . Drug use: No  . Sexual activity: Never  Lifestyle  . Physical activity:    Days per week: Not on file    Minutes per session: Not on file  .  Stress: Not on file  Relationships  . Social connections:    Talks on phone: Not on file    Gets together: Not on file    Attends religious service: Not on file    Active member of club or organization: Not on file    Attends meetings of clubs or organizations: Not on file    Relationship status: Not on file  . Intimate partner violence:    Fear of current or ex partner: Not on file  Emotionally abused: Not on file    Physically abused: Not on file    Forced sexual activity: Not on file  Other Topics Concern  . Not on file  Social History Narrative   Married    children    Outpatient Medications Prior to Visit  Medication Sig Dispense Refill  . amLODipine (NORVASC) 2.5 MG tablet Take 1 tablet (2.5 mg total) by mouth 2 (two) times daily. 180 tablet 1  . docusate sodium (COLACE) 100 MG capsule Take 100 mg by mouth 2 (two) times daily. Reported on 03/30/2016    . fluticasone (FLONASE) 50 MCG/ACT nasal spray Place 2 sprays into both nostrils daily. 48 g 3  . gabapentin (NEURONTIN) 100 MG capsule Take 2 capsules (200 mg total) by mouth at bedtime. 180 capsule 0  . hydrochlorothiazide (HYDRODIURIL) 25 MG tablet Take 1 tablet (25 mg total) by mouth daily. 90 tablet 1  . KRILL OIL PO Take 750 mg by mouth daily.     Marland Kitchen levothyroxine (SYNTHROID, LEVOTHROID) 125 MCG tablet Take 1 tablet (125 mcg total) by mouth daily. 90 tablet 0  . losartan (COZAAR) 100 MG tablet TAKE 1 TABLET BY MOUTH EVERY DAY 90 tablet 1  . Multiple Vitamins-Minerals (MULTIVITAMIN WOMEN 50+ PO) Take 1 tablet by mouth daily.    . naproxen (NAPROSYN) 375 MG tablet Take 1 tablet (375 mg total) by mouth 2 (two) times daily with a meal. 180 tablet 1  . NITROSTAT 0.4 MG SL tablet PLACE 1 TABLET UNDER THE TONGUE EVERY 5 MINUTES AS NEEDED FOR CHEST PAIN 25 tablet 0  . omeprazole (PRILOSEC) 40 MG capsule Take 1 capsule (40 mg total) by mouth 2 (two) times daily. 180 capsule 1  . Polyethyl Glycol-Propyl Glycol 0.4-0.3 % GEL Apply 1  drop to eye 2 (two) times daily as needed (dry eyes).     . polyethylene glycol (MIRALAX / GLYCOLAX) packet Take 17 g by mouth daily.    . Probiotic Product (PROBIOTIC ADVANCED PO) Take 2 tablets by mouth at bedtime. Digestive Advantage    . psyllium (REGULOID) 0.52 G capsule Take 0.52 g by mouth at bedtime.    . ranitidine (ZANTAC) 300 MG tablet TAKE 1 TABLET BY MOUTH EVERYDAY AT BEDTIME 90 tablet 1  . simvastatin (ZOCOR) 40 MG tablet TAKE 1 TABLET BY MOUTH EVERYDAY AT BEDTIME 90 tablet 0  . sodium chloride (OCEAN) 0.65 % SOLN nasal spray Place 1 spray into both nostrils 2 (two) times daily as needed for congestion.     . TURMERIC PO Take 1 tablet by mouth daily. Reported on 12/19/2015    . vitamin B-12 (CYANOCOBALAMIN) 1000 MCG tablet Take 100 mcg by mouth every other day.    . Vitamin D, Cholecalciferol, 1000 UNITS CAPS Take 1 capsule by mouth daily.    Marland Kitchen aspirin EC 81 MG tablet Take 81 mg by mouth daily. Reported on 12/19/2015     No facility-administered medications prior to visit.     Allergies  Allergen Reactions  . Neomycin-Bacitracin Zn-Polymyx Rash    Polysporin- is tolerated   . Niacin Other (See Comments) and Cough    "cough til I threw up" (12/19/2012)  . Ciprofloxacin Hives    Got cipro and flagyl at same time, localized hives to IV arm  . Flagyl [Metronidazole] Hives    Got cipro and flagyl at same time, localized hives to IV arm    Review of Systems  Constitutional: Positive for malaise/fatigue. Negative for fever.  HENT: Negative  for congestion.   Eyes: Negative for blurred vision.  Respiratory: Negative for shortness of breath.   Cardiovascular: Negative for chest pain, palpitations and leg swelling.  Gastrointestinal: Negative for abdominal pain, blood in stool and nausea.  Genitourinary: Negative for dysuria and frequency.  Musculoskeletal: Positive for joint pain. Negative for falls.  Skin: Positive for itching and rash.  Neurological: Negative for dizziness, loss  of consciousness and headaches.  Endo/Heme/Allergies: Negative for environmental allergies.  Psychiatric/Behavioral: Negative for depression. The patient is not nervous/anxious.        Objective:    Physical Exam  Constitutional: She is oriented to person, place, and time. She appears well-developed and well-nourished. No distress.  HENT:  Head: Normocephalic and atraumatic.  Nose: Nose normal.  Eyes: Right eye exhibits no discharge. Left eye exhibits no discharge.  Neck: Normal range of motion. Neck supple.  Cardiovascular: Normal rate and regular rhythm.  Murmur heard. Pulmonary/Chest: Effort normal and breath sounds normal.  Abdominal: Soft. Bowel sounds are normal. There is no tenderness.  Musculoskeletal: She exhibits no edema.  Neurological: She is alert and oriented to person, place, and time.  Skin: Skin is warm and dry. Rash noted.  Macular patches on upper chest, upper back and on left arm. Scaly and mildly pigmented  Psychiatric: She has a normal mood and affect.  Nursing note and vitals reviewed.   BP 122/70 (BP Location: Left Arm, Patient Position: Sitting, Cuff Size: Normal)   Pulse 68   Temp 97.7 F (36.5 C) (Oral)   Resp 18   Ht 5\' 2"  (1.575 m)   Wt 205 lb 12.8 oz (93.4 kg)   SpO2 97%   BMI 37.64 kg/m  Wt Readings from Last 3 Encounters:  03/03/18 205 lb 12.8 oz (93.4 kg)  02/28/18 204 lb (92.5 kg)  01/23/18 207 lb 9.6 oz (94.2 kg)   BP Readings from Last 3 Encounters:  03/03/18 122/70  02/28/18 138/77  01/23/18 140/74     Immunization History  Administered Date(s) Administered  . Influenza Split 09/12/2012, 10/02/2015  . Influenza, High Dose Seasonal PF 09/14/2016, 09/05/2017  . Influenza,inj,Quad PF,6+ Mos 08/30/2013, 10/21/2014, 08/20/2015  . Pneumococcal Conjugate-13 07/19/2011, 11/01/2014  . Pneumococcal Polysaccharide-23 03/30/2016  . Tdap 10/02/2013  . Zoster 12/09/2008    Health Maintenance  Topic Date Due  . Hepatitis C Screening   Jul 14, 1946  . MAMMOGRAM  08/31/2019  . TETANUS/TDAP  10/03/2023  . COLONOSCOPY  12/21/2023  . INFLUENZA VACCINE  Completed  . DEXA SCAN  Completed  . PNA vac Low Risk Adult  Completed    Lab Results  Component Value Date   WBC 6.4 02/24/2018   HGB 12.3 02/24/2018   HCT 36.5 02/24/2018   PLT 281.0 02/24/2018   GLUCOSE 102 (H) 02/24/2018   CHOL 141 12/14/2017   TRIG 107.0 12/14/2017   HDL 46.60 12/14/2017   LDLCALC 73 12/14/2017   ALT 19 02/24/2018   AST 17 02/24/2018   NA 137 02/24/2018   K 4.0 02/24/2018   CL 99 02/24/2018   CREATININE 0.69 02/24/2018   BUN 16 02/24/2018   CO2 32 02/24/2018   TSH 0.49 02/24/2018   INR 0.99 03/16/2015   HGBA1C 5.8 (H) 07/18/2011    Lab Results  Component Value Date   TSH 0.49 02/24/2018   Lab Results  Component Value Date   WBC 6.4 02/24/2018   HGB 12.3 02/24/2018   HCT 36.5 02/24/2018   MCV 87.1 02/24/2018   PLT 281.0 02/24/2018  Lab Results  Component Value Date   NA 137 02/24/2018   K 4.0 02/24/2018   CO2 32 02/24/2018   GLUCOSE 102 (H) 02/24/2018   BUN 16 02/24/2018   CREATININE 0.69 02/24/2018   BILITOT 0.3 02/24/2018   ALKPHOS 76 02/24/2018   AST 17 02/24/2018   ALT 19 02/24/2018   PROT 6.4 02/24/2018   ALBUMIN 3.9 02/24/2018   CALCIUM 8.8 02/24/2018   ANIONGAP 6 12/03/2015   GFR 88.51 02/24/2018   Lab Results  Component Value Date   CHOL 141 12/14/2017   Lab Results  Component Value Date   HDL 46.60 12/14/2017   Lab Results  Component Value Date   LDLCALC 73 12/14/2017   Lab Results  Component Value Date   TRIG 107.0 12/14/2017   Lab Results  Component Value Date   CHOLHDL 3 12/14/2017   Lab Results  Component Value Date   HGBA1C 5.8 (H) 07/18/2011         Assessment & Plan:   Problem List Items Addressed This Visit    Hyperlipidemia, mixed    Encouraged heart healthy diet, increase exercise, avoid trans fats, consider a krill oil cap daily      Essential hypertension    Well  controlled, no changes to meds. Encouraged heart healthy diet such as the DASH diet and exercise as tolerated.       Hypothyroidism    On Levothyroxine, continue to monitor      Preventative health care    Patient encouraged to maintain heart healthy diet, regular exercise, adequate sleep. Consider daily probiotics. Take medications as prescribed.       Vitamin B12 deficiency    Level continues to be elevated. Decrease to dose just 2 x a week.       Dermatitis    Tinea appearance. Will try Ketoconazole shampoo weekly if no relief will contact her dermatologist      Obesity    Has started with bariatric service and feels it will be very helpful for her.       Vitamin D deficiency    Daily Vitamin D supplements         I have discontinued Myonna P. Strawser's aspirin EC. I am also having her start on KETOCONAZOLE (TOPICAL). Additionally, I am having her maintain her psyllium, KRILL OIL PO, TURMERIC PO, Polyethyl Glycol-Propyl Glycol, sodium chloride, Probiotic Product (PROBIOTIC ADVANCED PO), Vitamin D (Cholecalciferol), docusate sodium, NITROSTAT, polyethylene glycol, fluticasone, omeprazole, ranitidine, naproxen, hydrochlorothiazide, amLODipine, levothyroxine, losartan, gabapentin, simvastatin, Multiple Vitamins-Minerals (MULTIVITAMIN WOMEN 50+ PO), and vitamin B-12.  Meds ordered this encounter  Medications  . KETOCONAZOLE, TOPICAL, 1 % SHAM    Sig: Apply 1 Dose topically once a week.    Dispense:  200 mL    Refill:  1    CMA served as scribe during this visit. History, Physical and Plan performed by medical provider. Documentation and orders reviewed and attested to.  Penni Homans, MD

## 2018-03-03 NOTE — Assessment & Plan Note (Signed)
Patient encouraged to maintain heart healthy diet, regular exercise, adequate sleep. Consider daily probiotics. Take medications as prescribed 

## 2018-03-05 NOTE — Assessment & Plan Note (Signed)
Daily Vitamin D supplements

## 2018-03-05 NOTE — Assessment & Plan Note (Signed)
Level continues to be elevated. Decrease to dose just 2 x a week.

## 2018-03-05 NOTE — Assessment & Plan Note (Signed)
Tinea appearance. Will try Ketoconazole shampoo weekly if no relief will contact her dermatologist

## 2018-03-05 NOTE — Assessment & Plan Note (Signed)
Has started with bariatric service and feels it will be very helpful for her.

## 2018-03-06 DIAGNOSIS — M47816 Spondylosis without myelopathy or radiculopathy, lumbar region: Secondary | ICD-10-CM | POA: Diagnosis not present

## 2018-03-06 DIAGNOSIS — M9903 Segmental and somatic dysfunction of lumbar region: Secondary | ICD-10-CM | POA: Diagnosis not present

## 2018-03-06 DIAGNOSIS — M9901 Segmental and somatic dysfunction of cervical region: Secondary | ICD-10-CM | POA: Diagnosis not present

## 2018-03-06 DIAGNOSIS — M4722 Other spondylosis with radiculopathy, cervical region: Secondary | ICD-10-CM | POA: Diagnosis not present

## 2018-03-09 DIAGNOSIS — M9903 Segmental and somatic dysfunction of lumbar region: Secondary | ICD-10-CM | POA: Diagnosis not present

## 2018-03-09 DIAGNOSIS — M4722 Other spondylosis with radiculopathy, cervical region: Secondary | ICD-10-CM | POA: Diagnosis not present

## 2018-03-09 DIAGNOSIS — M47816 Spondylosis without myelopathy or radiculopathy, lumbar region: Secondary | ICD-10-CM | POA: Diagnosis not present

## 2018-03-09 DIAGNOSIS — M9901 Segmental and somatic dysfunction of cervical region: Secondary | ICD-10-CM | POA: Diagnosis not present

## 2018-03-10 ENCOUNTER — Other Ambulatory Visit: Payer: Self-pay

## 2018-03-10 ENCOUNTER — Ambulatory Visit (HOSPITAL_COMMUNITY): Payer: PPO | Attending: Cardiovascular Disease

## 2018-03-10 DIAGNOSIS — E785 Hyperlipidemia, unspecified: Secondary | ICD-10-CM | POA: Insufficient documentation

## 2018-03-10 DIAGNOSIS — R011 Cardiac murmur, unspecified: Secondary | ICD-10-CM | POA: Diagnosis not present

## 2018-03-10 DIAGNOSIS — R0602 Shortness of breath: Secondary | ICD-10-CM

## 2018-03-10 DIAGNOSIS — E669 Obesity, unspecified: Secondary | ICD-10-CM | POA: Diagnosis not present

## 2018-03-10 DIAGNOSIS — I10 Essential (primary) hypertension: Secondary | ICD-10-CM | POA: Diagnosis not present

## 2018-03-10 DIAGNOSIS — J45909 Unspecified asthma, uncomplicated: Secondary | ICD-10-CM | POA: Insufficient documentation

## 2018-03-10 DIAGNOSIS — G4733 Obstructive sleep apnea (adult) (pediatric): Secondary | ICD-10-CM | POA: Diagnosis not present

## 2018-03-10 DIAGNOSIS — Z8249 Family history of ischemic heart disease and other diseases of the circulatory system: Secondary | ICD-10-CM | POA: Insufficient documentation

## 2018-03-10 DIAGNOSIS — Z6837 Body mass index (BMI) 37.0-37.9, adult: Secondary | ICD-10-CM | POA: Diagnosis not present

## 2018-03-13 ENCOUNTER — Ambulatory Visit (INDEPENDENT_AMBULATORY_CARE_PROVIDER_SITE_OTHER): Payer: PPO | Admitting: Family Medicine

## 2018-03-13 VITALS — BP 137/69 | HR 87 | Temp 98.0°F | Ht 62.0 in | Wt 193.0 lb

## 2018-03-13 DIAGNOSIS — Z6835 Body mass index (BMI) 35.0-35.9, adult: Secondary | ICD-10-CM

## 2018-03-13 DIAGNOSIS — M47816 Spondylosis without myelopathy or radiculopathy, lumbar region: Secondary | ICD-10-CM | POA: Diagnosis not present

## 2018-03-13 DIAGNOSIS — I10 Essential (primary) hypertension: Secondary | ICD-10-CM | POA: Diagnosis not present

## 2018-03-13 DIAGNOSIS — M9901 Segmental and somatic dysfunction of cervical region: Secondary | ICD-10-CM | POA: Diagnosis not present

## 2018-03-13 DIAGNOSIS — M4722 Other spondylosis with radiculopathy, cervical region: Secondary | ICD-10-CM | POA: Diagnosis not present

## 2018-03-13 DIAGNOSIS — R7303 Prediabetes: Secondary | ICD-10-CM | POA: Diagnosis not present

## 2018-03-13 DIAGNOSIS — M9903 Segmental and somatic dysfunction of lumbar region: Secondary | ICD-10-CM | POA: Diagnosis not present

## 2018-03-14 NOTE — Progress Notes (Signed)
Office: [phone]  /  Fax: [phone]   HPI:   Chief Complaint: OBESITY Jackie Stevens is here to discuss her progress with her obesity treatment plan. She is on the Category 1 plan and is following her eating plan approximately 100 % of the time. She states she is exercising 0 minutes 0 times per week. [name] did well with weight loss on Category 1, hunger is mostly controlled. She lost approximately 50% H20 weight which is accepted. She deviated from plan especially for dinner and didn't eat all her food at the time.  Her weight is 193 lb (87.5 kg) today and has had a weight loss of 11 pounds over a period of 2 weeks since her last visit. She has lost 11 lbs since starting treatment with [place].  Pre-Diabetes Jackie Stevens has a new diagnosis of pre-diabetes with elevated fasting glucose and elevated fasting insulin and was informed this puts her at greater risk of developing diabetes. She notes polyphagia improved with diet prescription in the last 2 weeks. She is not taking metformin currently and continues to work on diet and exercise to decrease risk of diabetes. She denies nausea or hypoglycemia.  Hypertension Jackie Stevens is a 72 y.o. female with hypertension. Jackie Stevens's is stable on medications, she denies chest pain, headache, or dizziness. She would like to diet control her blood pressure with the goal of decreasing her risk of heart attack and stroke. Denym's blood pressure is currently controlled.  ALLERGIES: Allergies  Allergen Reactions  . Neomycin-Bacitracin Zn-Polymyx Rash    Polysporin- is tolerated   . Niacin Other (See Comments) and Cough    "cough til I threw up" (12/19/2012)  . Ciprofloxacin Hives    Got cipro and flagyl at same time, localized hives to IV arm  . Flagyl [Metronidazole] Hives    Got cipro and flagyl at same time, localized hives to IV arm    MEDICATIONS: Current Outpatient Medications on File Prior to Visit  Medication Sig Dispense Refill  . amLODipine  (NORVASC) 2.5 MG tablet Take 1 tablet (2.5 mg total) by mouth 2 (two) times daily. 180 tablet 1  . docusate sodium (COLACE) 100 MG capsule Take 100 mg by mouth 2 (two) times daily. Reported on 03/30/2016    . fluticasone (FLONASE) 50 MCG/ACT nasal spray Place 2 sprays into both nostrils daily. 48 g 3  . gabapentin (NEURONTIN) 100 MG capsule Take 2 capsules (200 mg total) by mouth at bedtime. 180 capsule 0  . hydrochlorothiazide (HYDRODIURIL) 25 MG tablet Take 1 tablet (25 mg total) by mouth daily. 90 tablet 1  . KETOCONAZOLE, TOPICAL, 1 % SHAM Apply 1 Dose topically once a week. 200 mL 1  . KRILL OIL PO Take 750 mg by mouth daily.     Marland Kitchen levothyroxine (SYNTHROID, LEVOTHROID) 125 MCG tablet Take 1 tablet (125 mcg total) by mouth daily. 90 tablet 0  . losartan (COZAAR) 100 MG tablet TAKE 1 TABLET BY MOUTH EVERY DAY 90 tablet 1  . Multiple Vitamins-Minerals (MULTIVITAMIN WOMEN 50+ PO) Take 1 tablet by mouth daily.    . naproxen (NAPROSYN) 375 MG tablet Take 1 tablet (375 mg total) by mouth 2 (two) times daily with a meal. 180 tablet 1  . NITROSTAT 0.4 MG SL tablet PLACE 1 TABLET UNDER THE TONGUE EVERY 5 MINUTES AS NEEDED FOR CHEST PAIN 25 tablet 0  . omeprazole (PRILOSEC) 40 MG capsule Take 1 capsule (40 mg total) by mouth 2 (two) times daily. 180 capsule 1  .  Polyethyl Glycol-Propyl Glycol 0.4-0.3 % GEL Apply 1 drop to eye 2 (two) times daily as needed (dry eyes).     . polyethylene glycol (MIRALAX / GLYCOLAX) packet Take 17 g by mouth daily.    . Probiotic Product (PROBIOTIC ADVANCED PO) Take 2 tablets by mouth at bedtime. Digestive Advantage    . psyllium (REGULOID) 0.52 G capsule Take 0.52 g by mouth at bedtime.    . ranitidine (ZANTAC) 300 MG tablet TAKE 1 TABLET BY MOUTH EVERYDAY AT BEDTIME 90 tablet 1  . simvastatin (ZOCOR) 40 MG tablet TAKE 1 TABLET BY MOUTH EVERYDAY AT BEDTIME 90 tablet 0  . sodium chloride (OCEAN) 0.65 % SOLN nasal spray Place 1 spray into both nostrils 2 (two) times daily as  needed for congestion.     . TURMERIC PO Take 1 tablet by mouth daily. Reported on 12/19/2015    . vitamin B-12 (CYANOCOBALAMIN) 1000 MCG tablet Take 100 mcg by mouth every other day.    . Vitamin D, Cholecalciferol, 1000 UNITS CAPS Take 1 capsule by mouth daily.     No current facility-administered medications on file prior to visit.     PAST MEDICAL HISTORY: Past Medical History:  Diagnosis Date  . Abnormal cervical cytology 10/25/2012   Follows with Dr Leavy Cella of Gyn  . Allergic rhinitis   . Anemia 10/06/2013  . Anginal pain ([place])    pt has history of CP states had cardiac workup with no specific issues identified   . Anxiety    when increased stress   . Arthritis    "knees; right thumb; shoulders" (2013/01/18)  . Asthma   . Chest pain   . Chicken pox as a child  . Complication of anesthesia   . Constipation   . Decreased hearing   . Dermatitis 03/06/2017  . Diverticulitis 10/25/2012   pt. reports that a drain was placed - 09/2012    . Dry mouth   . Excessive thirst   . External hemorrhoid, bleeding    "sometimes" (01-18-13)  . Frequent urination   . GERD (gastroesophageal reflux disease)   . H/O hiatal hernia   . Hand tingling   . Heart murmur   . HTN (hypertension)    stress test completed by Anselm Lis, diagnosed as GERD  . Hyperlipidemia   . Hypothyroidism   . Incontinence   . Infertility, female   . Insomnia   . Joint pain   . Kidney stones 1970's   "passed on their own" (2013-01-18)  . Knee pain   . Left shoulder pain 09/14/2016  . Low back pain 06/09/2014  . Measles as a child  . Medicare annual wellness visit, subsequent 10/06/2013   Steinhoffer of Dermatology Pneumovax in 2012   . Memory difficulties 09/05/2017  . Obesity 09/11/2017  . OSA on CPAP   . Palpitations   . Paresthesia 03/23/2015   Left face  . PONV (postoperative nausea and vomiting)   . Preventative health care 10/06/2013   Steinhoffer of Dermatology Pneumovax in 2012  .  Preventative health care 02/26/2016  . Rheumatoid arteritis   . Shortness of breath dyspnea    using stairs  . Sinus pain   . Sleep apnea   . Swelling of both lower extremities   . Thyroid cancer ([place]) 1980's  . Thyroid disease   . Tinnitus   . UTI (urinary tract infection) 04/02/2013    PAST SURGICAL HISTORY: Past Surgical History:  Procedure Laterality Date  . CHOLECYSTECTOMY  [place]    . COLOSTOMY REVISION  12/19/2012   Procedure: COLON RESECTION SIGMOID;  Surgeon: Gwenyth Ober, MD;  Location: [facility];  Service: General;  Laterality: N/A;  . CYSTOSCOPY WITH STENT PLACEMENT  12/19/2012   Procedure: CYSTOSCOPY WITH STENT PLACEMENT;  Surgeon: Hanley Ben, MD;  Location: [facility];  Service: Urology;  Laterality: N/A;  . DILATION AND CURETTAGE OF UTERUS  1960's   "lots of them; had miscarriages" (12/19/2012)  . LYSIS OF ADHESION N/A 12/02/2015   Procedure: LAPAROSCOPIC LYSIS OF ADHESION;  Surgeon: Johnathan Hausen, MD;  Location: [facility];  Service: General;  Laterality: N/A;  . ROBOTIC ASSISTED BILATERAL SALPINGO OOPHERECTOMY Bilateral 12/02/2015   Procedure: XI ROBOTIC ASSISTED BILATERAL SALPINGO OOPHORECTOMY;  Surgeon: Everitt Amber, MD;  Location: [facility];  Service: Gynecology;  Laterality: Bilateral;  . SIGMOID RESECTION / RECTOPEXY  12/19/2012  . THYROIDECTOMY, PARTIAL  1988   "then did iodine to remove the rest" (12/19/2012)  . TONSILLECTOMY  1951?  . TRANSRECTAL DRAINAGE OF PELVIC ABSCESS  10/27/2012  . VAGINAL HYSTERECTOMY  1970's   "still have my ovaries" (12/19/2012)    SOCIAL HISTORY: Social History   Tobacco Use  . Smoking status: Never Smoker  . Smokeless tobacco: Never Used  Substance Use Topics  . Alcohol use: No    Alcohol/week: 0.0 oz  . Drug use: No    FAMILY HISTORY: Family History  Problem Relation Age of Onset  . Heart disease Father   . Pneumonia Father   . Hypertension Father   . Hyperlipidemia Father   . Cancer Father        skin  . Stroke  Father   . Alzheimer's disease Mother   . Heart disease Mother   . Depression Mother   . Emphysema Brother        marijuana and cigarettes  . Alcohol abuse Brother   . Hearing loss Brother   . Diabetes Maternal Grandmother   . Alzheimer's disease Paternal Grandmother   . Cancer Paternal Grandmother        lung?- smoker  . Hyperlipidemia Paternal Grandmother   . Heart attack Paternal Grandfather   . Alcohol abuse Paternal Grandfather   . Neurofibromatosis Son        schwanomatosis  . Neurofibromatosis Son        swanomatosis  . Cancer Paternal Aunt     ROS: Review of Systems  Constitutional: Positive for weight loss.  Cardiovascular: Negative for chest pain.  Gastrointestinal: Negative for nausea.  Neurological: Negative for dizziness and headaches.  Endo/Heme/Allergies:       Positive polyphagia Negative hypoglycemia    PHYSICAL EXAM: Blood pressure 137/69, pulse 87, temperature 98 F (36.7 C), temperature source Oral, height 5\' 2"  (1.575 m), weight 193 lb (87.5 kg), SpO2 96 %. Body mass index is 35.3 kg/m. Physical Exam  Constitutional: She is oriented to person, place, and time. She appears well-developed and well-nourished.  Cardiovascular: Normal rate.  Pulmonary/Chest: Effort normal.  Musculoskeletal: Normal range of motion.  Neurological: She is oriented to person, place, and time.  Skin: Skin is warm and dry.  Psychiatric: She has a normal mood and affect. Her behavior is normal.  Vitals reviewed.   RECENT LABS AND TESTS: BMET    Component Value Date/Time   NA 137 02/24/2018 0829   K 4.0 02/24/2018 0829   CL 99 02/24/2018 0829   CO2 32 02/24/2018 0829   GLUCOSE 102 (H) 02/24/2018 4098  BUN 16 02/24/2018 0829   CREATININE 0.69 02/24/2018 0829   CREATININE 0.75 05/28/2014 0836   CALCIUM 8.8 02/24/2018 0829   GFRNONAA >60 12/03/2015 0522   GFRAA >60 12/03/2015 0522   Lab Results  Component Value Date   HGBA1C 5.8 (H) 07/18/2011   Lab Results    Component Value Date   INSULIN 7.9 02/28/2018   CBC    Component Value Date/Time   WBC 6.4 02/24/2018 0829   RBC 4.19 02/24/2018 0829   HGB 12.3 02/24/2018 0829   HCT 36.5 02/24/2018 0829   PLT 281.0 02/24/2018 0829   MCV 87.1 02/24/2018 0829   MCH 28.7 12/03/2015 0522   MCHC 33.6 02/24/2018 0829   RDW 13.7 02/24/2018 0829   LYMPHSABS 1.5 12/14/2017 0935   MONOABS 0.5 12/14/2017 0935   EOSABS 0.4 12/14/2017 0935   BASOSABS 0.0 12/14/2017 0935   Iron/TIBC/Ferritin/ %Sat    Component Value Date/Time   IRON 33 09/15/2015 0618   TIBC 249 (L) 09/15/2015 0618   FERRITIN 52 09/15/2015 0618   IRONPCTSAT 13 09/15/2015 0618   IRONPCTSAT 22 07/02/2013 1056   Lipid Panel     Component Value Date/Time   CHOL 141 12/14/2017 0935   TRIG 107.0 12/14/2017 0935   HDL 46.60 12/14/2017 0935   CHOLHDL 3 12/14/2017 0935   VLDL 21.4 12/14/2017 0935   LDLCALC 73 12/14/2017 0935   Hepatic Function Panel     Component Value Date/Time   PROT 6.4 02/24/2018 0829   ALBUMIN 3.9 02/24/2018 0829   AST 17 02/24/2018 0829   ALT 19 02/24/2018 0829   ALKPHOS 76 02/24/2018 0829   BILITOT 0.3 02/24/2018 0829   BILIDIR 0.0 11/01/2014 1116   IBILI 0.3 05/28/2014 0836      Component Value Date/Time   TSH 0.49 02/24/2018 0829   TSH 0.40 12/14/2017 0935   TSH 0.18 (L) 09/05/2017 1509    ASSESSMENT AND PLAN: Prediabetes  Essential hypertension  Class 2 severe obesity with serious comorbidity and body mass index (BMI) of 35.0 to 35.9 in adult, unspecified obesity type ([place])  PLAN:  Pre-Diabetes Briannon will continue to work on weight loss, diet, exercise, and decreasing simple carbohydrates in her diet to help decrease the risk of diabetes. We dicussed metformin including benefits and risks. She was informed that eating too many simple carbohydrates or too many calories at one sitting increases the likelihood of GI side effects. Jackie Stevens declined metformin for now and a prescription was not  written today. We will recheck labs in 3 months and Jackie Stevens agrees to follow up with our clinic in 2 weeks as directed to monitor her progress.  Hypertension We discussed sodium restriction, working on healthy weight loss, and a regular exercise program as the means to achieve improved blood pressure control. Jackie Stevens agreed with this plan and agreed to follow up as directed. We will continue to monitor her blood pressure as well as her progress with the above lifestyle modifications. She will continue her medications as is and will monitor blood pressure closely, and will watch for signs of hypotension as she continues her lifestyle modifications. She will diet and exercise and Jackie Stevens agrees to follow up with our clinic in 2 weeks.  We spent > than 50% of the 30 minute visit on the counseling as documented in the note.  Obesity Hedy is currently in the action stage of change. As such, her goal is to continue with weight loss efforts She has agreed to change to follow  the Category 2 plan Denece has been instructed to work up to a goal of 150 minutes of combined cardio and strengthening exercise per week for weight loss and overall health benefits. We discussed the following Behavioral Modification Strategies today: increasing lean protein intake, decreasing simple carbohydrates, work on meal planning and easy cooking plans, and no skipping meals   Jackie Stevens has agreed to follow up with our clinic in 2 weeks. She was informed of the importance of frequent follow up visits to maximize her success with intensive lifestyle modifications for her multiple health conditions.   OBESITY BEHAVIORAL INTERVENTION VISIT  Today's visit was # 2 out of 22.  Starting weight: 204 lbs Starting date: 02/28/18 Today's weight : 193 lbs  Today's date: 03/13/2018 Total lbs lost to date: 11 (Patients must lose 7 lbs in the first 6 months to continue with counseling)   ASK: We discussed the diagnosis of obesity with  Jackie Stevens today and Jackie Stevens agreed to give [place] permission to discuss obesity behavioral modification therapy today.  ASSESS: Arnitra has the diagnosis of obesity and her BMI today is 35.29 Mrytle is in the action stage of change   ADVISE: Tahjanae was educated on the multiple health risks of obesity as well as the benefit of weight loss to improve her health. She was advised of the need for long term treatment and the importance of lifestyle modifications.  AGREE: Multiple dietary modification options and treatment options were discussed and  Jackie Stevens agreed to the above obesity treatment plan.  I, Trixie Dredge, am acting as transcriptionist for Dennard Nip, MD  I have reviewed the above documentation for accuracy and completeness, and I agree with the above. -Dennard Nip, MD

## 2018-03-15 DIAGNOSIS — M4722 Other spondylosis with radiculopathy, cervical region: Secondary | ICD-10-CM | POA: Diagnosis not present

## 2018-03-15 DIAGNOSIS — M47816 Spondylosis without myelopathy or radiculopathy, lumbar region: Secondary | ICD-10-CM | POA: Diagnosis not present

## 2018-03-15 DIAGNOSIS — M9903 Segmental and somatic dysfunction of lumbar region: Secondary | ICD-10-CM | POA: Diagnosis not present

## 2018-03-15 DIAGNOSIS — M9901 Segmental and somatic dysfunction of cervical region: Secondary | ICD-10-CM | POA: Diagnosis not present

## 2018-03-20 DIAGNOSIS — M9903 Segmental and somatic dysfunction of lumbar region: Secondary | ICD-10-CM | POA: Diagnosis not present

## 2018-03-20 DIAGNOSIS — M9901 Segmental and somatic dysfunction of cervical region: Secondary | ICD-10-CM | POA: Diagnosis not present

## 2018-03-20 DIAGNOSIS — M47816 Spondylosis without myelopathy or radiculopathy, lumbar region: Secondary | ICD-10-CM | POA: Diagnosis not present

## 2018-03-20 DIAGNOSIS — M4722 Other spondylosis with radiculopathy, cervical region: Secondary | ICD-10-CM | POA: Diagnosis not present

## 2018-03-27 ENCOUNTER — Ambulatory Visit (INDEPENDENT_AMBULATORY_CARE_PROVIDER_SITE_OTHER): Payer: PPO | Admitting: Family Medicine

## 2018-03-27 DIAGNOSIS — M9903 Segmental and somatic dysfunction of lumbar region: Secondary | ICD-10-CM | POA: Diagnosis not present

## 2018-03-27 DIAGNOSIS — M4722 Other spondylosis with radiculopathy, cervical region: Secondary | ICD-10-CM | POA: Diagnosis not present

## 2018-03-27 DIAGNOSIS — M47816 Spondylosis without myelopathy or radiculopathy, lumbar region: Secondary | ICD-10-CM | POA: Diagnosis not present

## 2018-03-27 DIAGNOSIS — M9901 Segmental and somatic dysfunction of cervical region: Secondary | ICD-10-CM | POA: Diagnosis not present

## 2018-03-28 ENCOUNTER — Ambulatory Visit (INDEPENDENT_AMBULATORY_CARE_PROVIDER_SITE_OTHER): Payer: PPO | Admitting: Family Medicine

## 2018-03-28 VITALS — BP 134/74 | HR 65 | Temp 97.5°F | Ht 62.0 in | Wt 190.0 lb

## 2018-03-28 DIAGNOSIS — E669 Obesity, unspecified: Secondary | ICD-10-CM

## 2018-03-28 DIAGNOSIS — R7303 Prediabetes: Secondary | ICD-10-CM | POA: Diagnosis not present

## 2018-03-28 DIAGNOSIS — Z6834 Body mass index (BMI) 34.0-34.9, adult: Secondary | ICD-10-CM

## 2018-03-28 NOTE — Progress Notes (Signed)
Office: [phone]  /  Fax: [phone]   HPI:   Chief Complaint: OBESITY Jackie Stevens is here to discuss her progress with her obesity treatment plan. She is on the  follow the Category 2 plan and is following her eating plan approximately 99 % of the time. She states she is exercising 60 minutes 2 times per week. Jackie Stevens continues to do well with weight loss. She notes increased evening hunger, but is eating her snacks and she is getting tired of sandwiches for lunch.  Her weight is 190 lb (86.2 kg) today and has had a weight loss of 3 pounds over a period of 2 weeks since her last visit. She has lost 14 lbs since starting treatment with [place].  Pre-Diabetes Jackie Stevens has a diagnosis of pre-diabetes based on her elevated Hgb A1c and was informed this puts her at greater risk of developing diabetes. Sheis doing well with diet and exercise to help prevent diabetes mellitus. She has questions about which foods would be healthier for her and what foods to avoid. She denies nausea or hypoglycemia.  ALLERGIES: Allergies  Allergen Reactions  . Neomycin-Bacitracin Zn-Polymyx Rash    Polysporin- is tolerated   . Niacin Other (See Comments) and Cough    "cough til I threw up" (12/19/2012)  . Ciprofloxacin Hives    Got cipro and flagyl at same time, localized hives to IV arm  . Flagyl [Metronidazole] Hives    Got cipro and flagyl at same time, localized hives to IV arm    MEDICATIONS: Current Outpatient Medications on File Prior to Visit  Medication Sig Dispense Refill  . amLODipine (NORVASC) 2.5 MG tablet Take 1 tablet (2.5 mg total) by mouth 2 (two) times daily. 180 tablet 1  . docusate sodium (COLACE) 100 MG capsule Take 100 mg by mouth 2 (two) times daily. Reported on 03/30/2016    . fluticasone (FLONASE) 50 MCG/ACT nasal spray Place 2 sprays into both nostrils daily. 48 g 3  . gabapentin (NEURONTIN) 100 MG capsule Take 2 capsules (200 mg total) by mouth at bedtime. 180 capsule 0  .  hydrochlorothiazide (HYDRODIURIL) 25 MG tablet Take 1 tablet (25 mg total) by mouth daily. 90 tablet 1  . KETOCONAZOLE, TOPICAL, 1 % SHAM Apply 1 Dose topically once a week. 200 mL 1  . KRILL OIL PO Take 750 mg by mouth daily.     Marland Kitchen levothyroxine (SYNTHROID, LEVOTHROID) 125 MCG tablet Take 1 tablet (125 mcg total) by mouth daily. 90 tablet 0  . losartan (COZAAR) 100 MG tablet TAKE 1 TABLET BY MOUTH EVERY DAY 90 tablet 1  . Multiple Vitamins-Minerals (MULTIVITAMIN WOMEN 50+ PO) Take 1 tablet by mouth daily.    . naproxen (NAPROSYN) 375 MG tablet Take 1 tablet (375 mg total) by mouth 2 (two) times daily with a meal. 180 tablet 1  . NITROSTAT 0.4 MG SL tablet PLACE 1 TABLET UNDER THE TONGUE EVERY 5 MINUTES AS NEEDED FOR CHEST PAIN 25 tablet 0  . omeprazole (PRILOSEC) 40 MG capsule Take 1 capsule (40 mg total) by mouth 2 (two) times daily. 180 capsule 1  . Polyethyl Glycol-Propyl Glycol 0.4-0.3 % GEL Apply 1 drop to eye 2 (two) times daily as needed (dry eyes).     . polyethylene glycol (MIRALAX / GLYCOLAX) packet Take 17 g by mouth daily.    . Probiotic Product (PROBIOTIC ADVANCED PO) Take 2 tablets by mouth at bedtime. Digestive Advantage    . psyllium (REGULOID) 0.52 G capsule Take 0.52  g by mouth at bedtime.    . ranitidine (ZANTAC) 300 MG tablet TAKE 1 TABLET BY MOUTH EVERYDAY AT BEDTIME 90 tablet 1  . simvastatin (ZOCOR) 40 MG tablet TAKE 1 TABLET BY MOUTH EVERYDAY AT BEDTIME 90 tablet 0  . sodium chloride (OCEAN) 0.65 % SOLN nasal spray Place 1 spray into both nostrils 2 (two) times daily as needed for congestion.     . TURMERIC PO Take 1 tablet by mouth daily. Reported on 12/19/2015    . vitamin B-12 (CYANOCOBALAMIN) 1000 MCG tablet Take 100 mcg by mouth every other day.    . Vitamin D, Cholecalciferol, 1000 UNITS CAPS Take 1 capsule by mouth daily.     No current facility-administered medications on file prior to visit.     PAST MEDICAL HISTORY: Past Medical History:  Diagnosis Date  .  Abnormal cervical cytology 10/25/2012   Follows with Dr Leavy Cella of Gyn  . Allergic rhinitis   . Anemia 10/06/2013  . Anginal pain ([place])    pt has history of CP states had cardiac workup with no specific issues identified   . Anxiety    when increased stress   . Arthritis    "knees; right thumb; shoulders" (08-Jan-2013)  . Asthma   . Chest pain   . Chicken pox as a child  . Complication of anesthesia   . Constipation   . Decreased hearing   . Dermatitis 03/06/2017  . Diverticulitis 10/25/2012   pt. reports that a drain was placed - 09/2012    . Dry mouth   . Excessive thirst   . External hemorrhoid, bleeding    "sometimes" (Jan 08, 2013)  . Frequent urination   . GERD (gastroesophageal reflux disease)   . H/O hiatal hernia   . Hand tingling   . Heart murmur   . HTN (hypertension)    stress test completed by Anselm Lis, diagnosed as GERD  . Hyperlipidemia   . Hypothyroidism   . Incontinence   . Infertility, female   . Insomnia   . Joint pain   . Kidney stones 1970's   "passed on their own" (01/08/2013)  . Knee pain   . Left shoulder pain 09/14/2016  . Low back pain 06/09/2014  . Measles as a child  . Medicare annual wellness visit, subsequent 10/06/2013   Steinhoffer of Dermatology Pneumovax in 2012   . Memory difficulties 09/05/2017  . Obesity 09/11/2017  . OSA on CPAP   . Palpitations   . Paresthesia 03/23/2015   Left face  . PONV (postoperative nausea and vomiting)   . Preventative health care 10/06/2013   [facility]inhoffer of Dermatology Pneumovax in 2012  . Preventative health care 02/26/2016  . Rheumatoid arteritis   . Shortness of breath dyspnea    using stairs  . Sinus pain   . Sleep apnea   . Swelling of both lower extremities   . Thyroid cancer ([place]) 1980's  . Thyroid disease   . Tinnitus   . UTI (urinary tract infection) 04/02/2013    PAST SURGICAL HISTORY: Past Surgical History:  Procedure Laterality Date  . CHOLECYSTECTOMY  1990  . COLON SURGERY      . COLOSTOMY REVISION  2013-01-08   Procedure: COLON RESECTION SIGMOID;  Surgeon: Gwenyth Ober, MD;  Location: [facility];  Service: General;  Laterality: N/A;  . CYSTOSCOPY WITH STENT PLACEMENT  01/08/13   Procedure: CYSTOSCOPY WITH STENT PLACEMENT;  Surgeon: Hanley Ben, MD;  Location: [facility];  Service: Urology;  Laterality:  N/A;  . DILATION AND CURETTAGE OF UTERUS  1960's   "lots of them; had miscarriages" (12/19/2012)  . LYSIS OF ADHESION N/A 12/02/2015   Procedure: LAPAROSCOPIC LYSIS OF ADHESION;  Surgeon: Johnathan Hausen, MD;  Location: [facility];  Service: General;  Laterality: N/A;  . ROBOTIC ASSISTED BILATERAL SALPINGO OOPHERECTOMY Bilateral 12/02/2015   Procedure: XI ROBOTIC ASSISTED BILATERAL SALPINGO OOPHORECTOMY;  Surgeon: Everitt Amber, MD;  Location: [facility];  Service: Gynecology;  Laterality: Bilateral;  . SIGMOID RESECTION / RECTOPEXY  12/19/2012  . THYROIDECTOMY, PARTIAL  1988   "then did iodine to remove the rest" (12/19/2012)  . TONSILLECTOMY  1951?  . TRANSRECTAL DRAINAGE OF PELVIC ABSCESS  10/27/2012  . VAGINAL HYSTERECTOMY  1970's   "still have my ovaries" (12/19/2012)    SOCIAL HISTORY: Social History   Tobacco Use  . Smoking status: Never Smoker  . Smokeless tobacco: Never Used  Substance Use Topics  . Alcohol use: No    Alcohol/week: 0.0 oz  . Drug use: No    FAMILY HISTORY: Family History  Problem Relation Age of Onset  . Heart disease Father   . Pneumonia Father   . Hypertension Father   . Hyperlipidemia Father   . Cancer Father        skin  . Stroke Father   . Alzheimer's disease Mother   . Heart disease Mother   . Depression Mother   . Emphysema Brother        marijuana and cigarettes  . Alcohol abuse Brother   . Hearing loss Brother   . Diabetes Maternal Grandmother   . Alzheimer's disease Paternal Grandmother   . Cancer Paternal Grandmother        lung?- smoker  . Hyperlipidemia Paternal Grandmother   . Heart attack Paternal Grandfather   . Alcohol  abuse Paternal Grandfather   . Neurofibromatosis Son        schwanomatosis  . Neurofibromatosis Son        swanomatosis  . Cancer Paternal Aunt     ROS: Review of Systems  Constitutional: Positive for weight loss.  Gastrointestinal: Negative for nausea.  Endo/Heme/Allergies:       Negative hypoglycemia    PHYSICAL EXAM: Blood pressure 134/74, pulse 65, temperature (!) 97.5 F (36.4 C), temperature source Oral, height 5\' 2"  (1.575 m), weight 190 lb (86.2 kg), SpO2 99 %. Body mass index is 34.75 kg/m. Physical Exam  Constitutional: She is oriented to person, place, and time. She appears well-developed and well-nourished.  Cardiovascular: Normal rate.  Pulmonary/Chest: Effort normal.  Musculoskeletal: Normal range of motion.  Neurological: She is oriented to person, place, and time.  Skin: Skin is warm and dry.  Psychiatric: She has a normal mood and affect. Her behavior is normal.  Vitals reviewed.   RECENT LABS AND TESTS: BMET    Component Value Date/Time   NA 137 02/24/2018 0829   K 4.0 02/24/2018 0829   CL 99 02/24/2018 0829   CO2 32 02/24/2018 0829   GLUCOSE 102 (H) 02/24/2018 0829   BUN 16 02/24/2018 0829   CREATININE 0.69 02/24/2018 0829   CREATININE 0.75 05/28/2014 0836   CALCIUM 8.8 02/24/2018 0829   GFRNONAA >60 12/03/2015 0522   GFRAA >60 12/03/2015 0522   Lab Results  Component Value Date   HGBA1C 5.8 (H) 07/18/2011   Lab Results  Component Value Date   INSULIN 7.9 02/28/2018   CBC    Component Value Date/Time   WBC 6.4 02/24/2018 0829  RBC 4.19 02/24/2018 0829   HGB 12.3 02/24/2018 0829   HCT 36.5 02/24/2018 0829   PLT 281.0 02/24/2018 0829   MCV 87.1 02/24/2018 0829   MCH 28.7 12/03/2015 0522   MCHC 33.6 02/24/2018 0829   RDW 13.7 02/24/2018 0829   LYMPHSABS 1.5 12/14/2017 0935   MONOABS 0.5 12/14/2017 0935   EOSABS 0.4 12/14/2017 0935   BASOSABS 0.0 12/14/2017 0935   Iron/TIBC/Ferritin/ %Sat    Component Value Date/Time   IRON  33 09/15/2015 0618   TIBC 249 (L) 09/15/2015 0618   FERRITIN 52 09/15/2015 0618   IRONPCTSAT 13 09/15/2015 0618   IRONPCTSAT 22 07/02/2013 1056   Lipid Panel     Component Value Date/Time   CHOL 141 12/14/2017 0935   TRIG 107.0 12/14/2017 0935   HDL 46.60 12/14/2017 0935   CHOLHDL 3 12/14/2017 0935   VLDL 21.4 12/14/2017 0935   LDLCALC 73 12/14/2017 0935   Hepatic Function Panel     Component Value Date/Time   PROT 6.4 02/24/2018 0829   ALBUMIN 3.9 02/24/2018 0829   AST 17 02/24/2018 0829   ALT 19 02/24/2018 0829   ALKPHOS 76 02/24/2018 0829   BILITOT 0.3 02/24/2018 0829   BILIDIR 0.0 11/01/2014 1116   IBILI 0.3 05/28/2014 0836      Component Value Date/Time   TSH 0.49 02/24/2018 0829   TSH 0.40 12/14/2017 0935   TSH 0.18 (L) 09/05/2017 1509    ASSESSMENT AND PLAN: Prediabetes  Class 1 obesity with serious comorbidity and body mass index (BMI) of 34.0 to 34.9 in adult, unspecified obesity type  PLAN:  Pre-Diabetes Jackie Stevens will continue to work on weight loss, exercise, and decreasing simple carbohydrates in her diet to help decrease the risk of diabetes. We dicussed metformin including benefits and risks. She was informed that eating too many simple carbohydrates or too many calories at one sitting increases the likelihood of GI side effects. Jackie Stevens declined metformin for now and a prescription was not written today. We will check labs in 2 months and follow closely and Jackie Stevens agrees to follow up with our clinic in 3 weeks as directed to monitor her progress.  We spent > than 50% of the 30 minute visit on the counseling as documented in the note.  Obesity Jackie Stevens is currently in the action stage of change. As such, her goal is to continue with weight loss efforts She has agreed to keep a food journal with 300-450 calories and 30 grams of protein at lunch daily and follow the Category 2 plan Jackie Stevens has been instructed to work up to a goal of 150 minutes of combined  cardio and strengthening exercise per week for weight loss and overall health benefits. We discussed the following Behavioral Modification Strategies today: increasing lean protein intake, no skipping meals, and keeping healthy foods in the home   Jackie Stevens has agreed to follow up with our clinic in 3 weeks. She was informed of the importance of frequent follow up visits to maximize her success with intensive lifestyle modifications for her multiple health conditions.   OBESITY BEHAVIORAL INTERVENTION VISIT  Today's visit was # 3 out of 22.  Starting weight: 204 lbs Starting date: 02/28/18 Today's weight : 190 lbs Today's date: 03/28/2018 Total lbs lost to date: 14 (Patients must lose 7 lbs in the first 6 months to continue with counseling)   ASK: We discussed the diagnosis of obesity with Jackie Stevens today and Jackie Stevens agreed to give [place] permission to discuss  obesity behavioral modification therapy today.  ASSESS: Jackie Stevens has the diagnosis of obesity and her BMI today is 34.74 Jackie Stevens is in the action stage of change   ADVISE: Jackie Stevens was educated on the multiple health risks of obesity as well as the benefit of weight loss to improve her health. She was advised of the need for long term treatment and the importance of lifestyle modifications.  AGREE: Multiple dietary modification options and treatment options were discussed and  Jackie Stevens agreed to the above obesity treatment plan.  I, Trixie Dredge, am acting as transcriptionist for Dennard Nip, MD  I have reviewed the above documentation for accuracy and completeness, and I agree with the above. -Dennard Nip, MD

## 2018-04-04 DIAGNOSIS — M4722 Other spondylosis with radiculopathy, cervical region: Secondary | ICD-10-CM | POA: Diagnosis not present

## 2018-04-04 DIAGNOSIS — M9903 Segmental and somatic dysfunction of lumbar region: Secondary | ICD-10-CM | POA: Diagnosis not present

## 2018-04-04 DIAGNOSIS — M9901 Segmental and somatic dysfunction of cervical region: Secondary | ICD-10-CM | POA: Diagnosis not present

## 2018-04-04 DIAGNOSIS — M47816 Spondylosis without myelopathy or radiculopathy, lumbar region: Secondary | ICD-10-CM | POA: Diagnosis not present

## 2018-04-06 ENCOUNTER — Ambulatory Visit: Payer: PPO | Admitting: Sports Medicine

## 2018-04-12 DIAGNOSIS — M47816 Spondylosis without myelopathy or radiculopathy, lumbar region: Secondary | ICD-10-CM | POA: Diagnosis not present

## 2018-04-12 DIAGNOSIS — M9901 Segmental and somatic dysfunction of cervical region: Secondary | ICD-10-CM | POA: Diagnosis not present

## 2018-04-12 DIAGNOSIS — M4722 Other spondylosis with radiculopathy, cervical region: Secondary | ICD-10-CM | POA: Diagnosis not present

## 2018-04-12 DIAGNOSIS — M9903 Segmental and somatic dysfunction of lumbar region: Secondary | ICD-10-CM | POA: Diagnosis not present

## 2018-04-13 ENCOUNTER — Ambulatory Visit: Payer: PPO | Admitting: Sports Medicine

## 2018-04-13 DIAGNOSIS — G4733 Obstructive sleep apnea (adult) (pediatric): Secondary | ICD-10-CM | POA: Diagnosis not present

## 2018-04-17 ENCOUNTER — Ambulatory Visit: Payer: PPO | Admitting: Sports Medicine

## 2018-04-19 ENCOUNTER — Ambulatory Visit (INDEPENDENT_AMBULATORY_CARE_PROVIDER_SITE_OTHER): Payer: PPO | Admitting: Family Medicine

## 2018-04-19 VITALS — BP 112/62 | HR 75 | Temp 98.6°F | Ht 62.0 in | Wt 184.0 lb

## 2018-04-19 DIAGNOSIS — Z6833 Body mass index (BMI) 33.0-33.9, adult: Secondary | ICD-10-CM

## 2018-04-19 DIAGNOSIS — E669 Obesity, unspecified: Secondary | ICD-10-CM | POA: Diagnosis not present

## 2018-04-19 DIAGNOSIS — R7303 Prediabetes: Secondary | ICD-10-CM

## 2018-04-19 NOTE — Progress Notes (Signed)
Office: [phone]  /  Fax: [phone]   HPI:   Chief Complaint: OBESITY Jackie Stevens is here to discuss her progress with her obesity treatment plan. She is on the keep a food journal with 300-450 calories and 30 grams of protein at lunch daily and follow the Category 2 plan and is following her eating plan approximately 95 % of the time. She states she is senior exercise for 60 minutes 2 times per week. Jackie Stevens continues to do well with weight loss on Category 1 but struggles with journaling. They do go out to eat for lunch at times but stick with foods on the eating out handout.  Her weight is 184 lb (83.5 kg) today and has had a weight loss of 6 pounds over a period of 3 weeks since her last visit. She has lost 20 lbs since starting treatment with [place].  Pre-Diabetes Jackie Stevens has a diagnosis of pre-diabetes based on her elevated Hgb A1c and was informed this puts her at greater risk of developing diabetes. She is not taking metformin currently and she continues to do well with diet prescription and exercise to decrease risk of diabetes. She notes decreased polyphagia and denies hypoglycemia.  ALLERGIES: Allergies  Allergen Reactions  . Neomycin-Bacitracin Zn-Polymyx Rash    Polysporin- is tolerated   . Niacin Other (See Comments) and Cough    "cough til I threw up" (12/19/2012)  . Ciprofloxacin Hives    Got cipro and flagyl at same time, localized hives to IV arm  . Flagyl [Metronidazole] Hives    Got cipro and flagyl at same time, localized hives to IV arm    MEDICATIONS: Current Outpatient Medications on File Prior to Visit  Medication Sig Dispense Refill  . amLODipine (NORVASC) 2.5 MG tablet Take 1 tablet (2.5 mg total) by mouth 2 (two) times daily. 180 tablet 1  . docusate sodium (COLACE) 100 MG capsule Take 100 mg by mouth 2 (two) times daily. Reported on 03/30/2016    . fluticasone (FLONASE) 50 MCG/ACT nasal spray Place 2 sprays into both nostrils daily. 48 g 3  . gabapentin  (NEURONTIN) 100 MG capsule Take 2 capsules (200 mg total) by mouth at bedtime. 180 capsule 0  . hydrochlorothiazide (HYDRODIURIL) 25 MG tablet Take 1 tablet (25 mg total) by mouth daily. 90 tablet 1  . KETOCONAZOLE, TOPICAL, 1 % SHAM Apply 1 Dose topically once a week. 200 mL 1  . KRILL OIL PO Take 750 mg by mouth daily.     Marland Kitchen levothyroxine (SYNTHROID, LEVOTHROID) 125 MCG tablet Take 1 tablet (125 mcg total) by mouth daily. 90 tablet 0  . losartan (COZAAR) 100 MG tablet TAKE 1 TABLET BY MOUTH EVERY DAY 90 tablet 1  . Multiple Vitamins-Minerals (MULTIVITAMIN WOMEN 50+ PO) Take 1 tablet by mouth daily.    . naproxen (NAPROSYN) 375 MG tablet Take 1 tablet (375 mg total) by mouth 2 (two) times daily with a meal. 180 tablet 1  . NITROSTAT 0.4 MG SL tablet PLACE 1 TABLET UNDER THE TONGUE EVERY 5 MINUTES AS NEEDED FOR CHEST PAIN 25 tablet 0  . omeprazole (PRILOSEC) 40 MG capsule Take 1 capsule (40 mg total) by mouth 2 (two) times daily. 180 capsule 1  . Polyethyl Glycol-Propyl Glycol 0.4-0.3 % GEL Apply 1 drop to eye 2 (two) times daily as needed (dry eyes).     . polyethylene glycol (MIRALAX / GLYCOLAX) packet Take 17 g by mouth daily.    . Probiotic Product (PROBIOTIC ADVANCED PO)  Take 2 tablets by mouth at bedtime. Digestive Advantage    . psyllium (REGULOID) 0.52 G capsule Take 0.52 g by mouth at bedtime.    . ranitidine (ZANTAC) 300 MG tablet TAKE 1 TABLET BY MOUTH EVERYDAY AT BEDTIME 90 tablet 1  . simvastatin (ZOCOR) 40 MG tablet TAKE 1 TABLET BY MOUTH EVERYDAY AT BEDTIME 90 tablet 0  . sodium chloride (OCEAN) 0.65 % SOLN nasal spray Place 1 spray into both nostrils 2 (two) times daily as needed for congestion.     . TURMERIC PO Take 1 tablet by mouth daily. Reported on 12/19/2015    . vitamin B-12 (CYANOCOBALAMIN) 1000 MCG tablet Take 100 mcg by mouth every other day.    . Vitamin D, Cholecalciferol, 1000 UNITS CAPS Take 1 capsule by mouth daily.     No current facility-administered medications  on file prior to visit.     PAST MEDICAL HISTORY: Past Medical History:  Diagnosis Date  . Abnormal cervical cytology 10/25/2012   Follows with Dr Leavy Cella of Gyn  . Allergic rhinitis   . Anemia 10/06/2013  . Anginal pain ([place])    pt has history of CP states had cardiac workup with no specific issues identified   . Anxiety    when increased stress   . Arthritis    "knees; right thumb; shoulders" (January 07, 2013)  . Asthma   . Chest pain   . Chicken pox as a child  . Complication of anesthesia   . Constipation   . Decreased hearing   . Dermatitis 03/06/2017  . Diverticulitis 10/25/2012   pt. reports that a drain was placed - 09/2012    . Dry mouth   . Excessive thirst   . External hemorrhoid, bleeding    "sometimes" (01/07/2013)  . Frequent urination   . GERD (gastroesophageal reflux disease)   . H/O hiatal hernia   . Hand tingling   . Heart murmur   . HTN (hypertension)    stress test completed by Anselm Lis, diagnosed as GERD  . Hyperlipidemia   . Hypothyroidism   . Incontinence   . Infertility, female   . Insomnia   . Joint pain   . Kidney stones 1970's   "passed on their own" (Jan 07, 2013)  . Knee pain   . Left shoulder pain 09/14/2016  . Low back pain 06/09/2014  . Measles as a child  . Medicare annual wellness visit, subsequent 10/06/2013   Steinhoffer of Dermatology Pneumovax in 2012   . Memory difficulties 09/05/2017  . Obesity 09/11/2017  . OSA on CPAP   . Palpitations   . Paresthesia 03/23/2015   Left face  . PONV (postoperative nausea and vomiting)   . Preventative health care 10/06/2013   Steinhoffer of Dermatology Pneumovax in 2012  . Preventative health care 02/26/2016  . Rheumatoid arteritis   . Shortness of breath dyspnea    using stairs  . Sinus pain   . Sleep apnea   . Swelling of both lower extremities   . Thyroid cancer ([place]) 1980's  . Thyroid disease   . Tinnitus   . UTI (urinary tract infection) 04/02/2013    PAST SURGICAL  HISTORY: Past Surgical History:  Procedure Laterality Date  . CHOLECYSTECTOMY  1990  . COLON SURGERY    . COLOSTOMY REVISION  01-07-2013   Procedure: COLON RESECTION SIGMOID;  Surgeon: Gwenyth Ober, MD;  Location: [place];  Service: General;  Laterality: N/A;  . CYSTOSCOPY WITH STENT PLACEMENT  07-Jan-2013  Procedure: CYSTOSCOPY WITH STENT PLACEMENT;  Surgeon: Hanley Ben, MD;  Location: [facility];  Service: Urology;  Laterality: N/A;  . DILATION AND CURETTAGE OF UTERUS  1960's   "lots of them; had miscarriages" (12/19/2012)  . LYSIS OF ADHESION N/A 12/02/2015   Procedure: LAPAROSCOPIC LYSIS OF ADHESION;  Surgeon: Johnathan Hausen, MD;  Location: [facility];  Service: General;  Laterality: N/A;  . ROBOTIC ASSISTED BILATERAL SALPINGO OOPHERECTOMY Bilateral 12/02/2015   Procedure: XI ROBOTIC ASSISTED BILATERAL SALPINGO OOPHORECTOMY;  Surgeon: Everitt Amber, MD;  Location: [facility];  Service: Gynecology;  Laterality: Bilateral;  . SIGMOID RESECTION / RECTOPEXY  12/19/2012  . THYROIDECTOMY, PARTIAL  1988   "then did iodine to remove the rest" (12/19/2012)  . TONSILLECTOMY  1951?  . TRANSRECTAL DRAINAGE OF PELVIC ABSCESS  10/27/2012  . VAGINAL HYSTERECTOMY  1970's   "still have my ovaries" (12/19/2012)    SOCIAL HISTORY: Social History   Tobacco Use  . Smoking status: Never Smoker  . Smokeless tobacco: Never Used  Substance Use Topics  . Alcohol use: No    Alcohol/week: 0.0 oz  . Drug use: No    FAMILY HISTORY: Family History  Problem Relation Age of Onset  . Heart disease Father   . Pneumonia Father   . Hypertension Father   . Hyperlipidemia Father   . Cancer Father        skin  . Stroke Father   . Alzheimer's disease Mother   . Heart disease Mother   . Depression Mother   . Emphysema Brother        marijuana and cigarettes  . Alcohol abuse Brother   . Hearing loss Brother   . Diabetes Maternal Grandmother   . Alzheimer's disease Paternal Grandmother   . Cancer Paternal Grandmother         lung?- smoker  . Hyperlipidemia Paternal Grandmother   . Heart attack Paternal Grandfather   . Alcohol abuse Paternal Grandfather   . Neurofibromatosis Son        schwanomatosis  . Neurofibromatosis Son        swanomatosis  . Cancer Paternal Aunt     ROS: Review of Systems  Constitutional: Positive for weight loss.  Gastrointestinal: Negative for nausea.  Endo/Heme/Allergies:       Positive polyphagia Negative hypoglycemia    PHYSICAL EXAM: Blood pressure 112/62, pulse 75, temperature 98.6 F (37 C), temperature source Oral, height 5\' 2"  (1.575 m), weight 184 lb (83.5 kg), SpO2 97 %. Body mass index is 33.65 kg/m. Physical Exam  Constitutional: She is oriented to person, place, and time. She appears well-developed and well-nourished.  Cardiovascular: Normal rate.  Pulmonary/Chest: Effort normal.  Musculoskeletal: Normal range of motion.  Neurological: She is oriented to person, place, and time.  Skin: Skin is warm and dry.  Psychiatric: She has a normal mood and affect. Her behavior is normal.  Vitals reviewed.   RECENT LABS AND TESTS: BMET    Component Value Date/Time   NA 137 02/24/2018 0829   K 4.0 02/24/2018 0829   CL 99 02/24/2018 0829   CO2 32 02/24/2018 0829   GLUCOSE 102 (H) 02/24/2018 0829   BUN 16 02/24/2018 0829   CREATININE 0.69 02/24/2018 0829   CREATININE 0.75 05/28/2014 0836   CALCIUM 8.8 02/24/2018 0829   GFRNONAA >60 12/03/2015 0522   GFRAA >60 12/03/2015 0522   Lab Results  Component Value Date   HGBA1C 5.8 (H) 07/18/2011   Lab Results  Component Value Date  INSULIN 7.9 02/28/2018   CBC    Component Value Date/Time   WBC 6.4 02/24/2018 0829   RBC 4.19 02/24/2018 0829   HGB 12.3 02/24/2018 0829   HCT 36.5 02/24/2018 0829   PLT 281.0 02/24/2018 0829   MCV 87.1 02/24/2018 0829   MCH 28.7 12/03/2015 0522   MCHC 33.6 02/24/2018 0829   RDW 13.7 02/24/2018 0829   LYMPHSABS 1.5 12/14/2017 0935   MONOABS 0.5 12/14/2017 0935   EOSABS  0.4 12/14/2017 0935   BASOSABS 0.0 12/14/2017 0935   Iron/TIBC/Ferritin/ %Sat    Component Value Date/Time   IRON 33 09/15/2015 0618   TIBC 249 (L) 09/15/2015 0618   FERRITIN 52 09/15/2015 0618   IRONPCTSAT 13 09/15/2015 0618   IRONPCTSAT 22 07/02/2013 1056   Lipid Panel     Component Value Date/Time   CHOL 141 12/14/2017 0935   TRIG 107.0 12/14/2017 0935   HDL 46.60 12/14/2017 0935   CHOLHDL 3 12/14/2017 0935   VLDL 21.4 12/14/2017 0935   LDLCALC 73 12/14/2017 0935   Hepatic Function Panel     Component Value Date/Time   PROT 6.4 02/24/2018 0829   ALBUMIN 3.9 02/24/2018 0829   AST 17 02/24/2018 0829   ALT 19 02/24/2018 0829   ALKPHOS 76 02/24/2018 0829   BILITOT 0.3 02/24/2018 0829   BILIDIR 0.0 11/01/2014 1116   IBILI 0.3 05/28/2014 0836      Component Value Date/Time   TSH 0.49 02/24/2018 0829   TSH 0.40 12/14/2017 0935   TSH 0.18 (L) 09/05/2017 1509    ASSESSMENT AND PLAN: Prediabetes  Class 1 obesity with serious comorbidity and body mass index (BMI) of 33.0 to 33.9 in adult, unspecified obesity type  PLAN:  Pre-Diabetes Jackie Stevens will continue to work on diet prescription, weight loss, exercise, and decreasing simple carbohydrates in her diet to help decrease the risk of diabetes. We dicussed metformin including benefits and risks. She was informed that eating too many simple carbohydrates or too many calories at one sitting increases the likelihood of GI side effects. Jackie Stevens declined metformin for now and a prescription was not written today. Jackie Stevens agrees to follow up with our clinic in 2 weeks as directed to monitor her progress.  Obesity Jackie Stevens is currently in the action stage of change. As such, her goal is to continue with weight loss efforts She has agreed to keep a food journal with 300-450 calories and 30 grams of protein at lunch daily and follow the Category 2 plan Jackie Stevens has been instructed to work up to a goal of 150 minutes of combined cardio  and strengthening exercise per week for weight loss and overall health benefits. We discussed the following Behavioral Modification Strategies today: increasing lean protein intake, increasing vegetables, increase H20 intake, and work on meal planning and easy cooking plans   Jackie Stevens has agreed to follow up with our clinic in 2 weeks. She was informed of the importance of frequent follow up visits to maximize her success with intensive lifestyle modifications for her multiple health conditions.   OBESITY BEHAVIORAL INTERVENTION VISIT  Today's visit was # 4 out of 22.  Starting weight: 204 lbs Starting date: 02/28/18 Today's weight : 184 lbs  Today's date: 04/19/2018 Total lbs lost to date: 20 (Patients must lose 7 lbs in the first 6 months to continue with counseling)   ASK: We discussed the diagnosis of obesity with Jackie Stevens today and Jackie Stevens agreed to give [place] permission to discuss obesity behavioral modification  therapy today.  ASSESS: Jackie Stevens has the diagnosis of obesity and her BMI today is 33.65 Jackie Stevens is in the action stage of change   ADVISE: Jackie Stevens was educated on the multiple health risks of obesity as well as the benefit of weight loss to improve her health. She was advised of the need for long term treatment and the importance of lifestyle modifications.  AGREE: Multiple dietary modification options and treatment options were discussed and  Jackie Stevens agreed to the above obesity treatment plan.  I, Trixie Dredge, am acting as transcriptionist for Dennard Nip, MD  I have reviewed the above documentation for accuracy and completeness, and I agree with the above. -Dennard Nip, MD

## 2018-04-23 ENCOUNTER — Other Ambulatory Visit: Payer: Self-pay | Admitting: Sports Medicine

## 2018-04-25 ENCOUNTER — Ambulatory Visit (INDEPENDENT_AMBULATORY_CARE_PROVIDER_SITE_OTHER): Payer: PPO | Admitting: Sports Medicine

## 2018-04-25 ENCOUNTER — Ambulatory Visit (INDEPENDENT_AMBULATORY_CARE_PROVIDER_SITE_OTHER): Payer: PPO

## 2018-04-25 ENCOUNTER — Encounter: Payer: Self-pay | Admitting: Sports Medicine

## 2018-04-25 ENCOUNTER — Ambulatory Visit: Payer: Self-pay

## 2018-04-25 VITALS — BP 124/74 | HR 80 | Ht 62.0 in | Wt 187.2 lb

## 2018-04-25 DIAGNOSIS — M19011 Primary osteoarthritis, right shoulder: Secondary | ICD-10-CM | POA: Diagnosis not present

## 2018-04-25 DIAGNOSIS — M25512 Pain in left shoulder: Secondary | ICD-10-CM

## 2018-04-25 DIAGNOSIS — M19012 Primary osteoarthritis, left shoulder: Secondary | ICD-10-CM

## 2018-04-25 DIAGNOSIS — M24612 Ankylosis, left shoulder: Secondary | ICD-10-CM | POA: Diagnosis not present

## 2018-04-25 DIAGNOSIS — M25511 Pain in right shoulder: Secondary | ICD-10-CM

## 2018-04-25 NOTE — Progress Notes (Signed)
Jackie Stevens. Jackie Stevens, [facility] - 72 y.o. female MRN [id number]  Date of birth: Apr 23, 1946  Visit Date: 04/25/2018  PCP: Mosie Lukes, MD   Referred by: Mosie Lukes, MD  Scribe for today's visit: Wendy Poet, LAT, ATC     SUBJECTIVE:  Jackie Stevens is here for Follow-up (L shoulder pain) .   Her L shoulder pain symptoms INITIALLY: Began 6 months ago insidious onset  Described as 4-5/10 sharp pain, radiating to L upper arm Worsened with overhead and cross-body ROM Improved with nothing noted Additional associated symptoms include: radiating pain into the L upper arm, grinding sensation.  N/T in B hands noted.    At this time symptoms show no change compared to onset  She has tried [place] w/ no relief.  04/25/18: Compared to the last office visit on 01/23/18, her previously described L shld pain symptoms are improving w/ decreased pain and improved L shoulder ROM.  Most problematic motion is L shoulder IR.  Pt states that she feels 80% improved. Current symptoms are mild & are nonradiating She had an injection at her last visit and feels that this really helped her.  ROS Denies night time disturbances. Denies fevers, chills, or night sweats. Denies unexplained weight loss. Reports personal history of cancer - thyroid CA Denies changes in bowel or bladder habits. Denies recent unreported falls. Denies new or worsening dyspnea or wheezing. Reports headaches or dizziness.  Reports numbness, tingling or weakness  In the extremities - in B hands Reports dizziness or presyncopal episodes Denies lower extremity edema    HISTORY & PERTINENT PRIOR DATA:  Prior History reviewed and updated per electronic medical record.  Significant/pertinent history, findings, studies include:  reports that she has never smoked. She has never used smokeless tobacco. No results for input(s): HGBA1C, LABURIC,  CREATINE in the last 8760 hours. Pre-Visit Info 02/25/2016 1:43 PM  Medication: Reviewed & UTD with the patient.  Preferred Pharmacy and which med where: [id number] - [place], [place] - [address] [place] [address] [place] [address]  Allergies verified: UTD  Immunization Status: Flu vaccine-- 10/02/15 Tdap-- 10/02/13 PNA-- 11/01/14 Shingles-- 12/09/08  A/P:  Changes to [place], [place] or Personal Hx: UTD Pap-- 07/22/15 w/ Dr. Marylynn Pearson at [facility]; negative MMG-- 08/04/15 w/ [facility]; bi-rads category 0-incomplete; need additional imaging evaluation Bone Density-- 08/08/15 w/ [facility]; L1-L2, L4 (T-score 3.7); normal; Right Neck (T-score 1.2); normal & Left Neck (T-score 0.6); normal CCS-- 12/20/13 w/ Dr. 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  Care Teams Updated: Dr. Marylynn Pearson - Gynecology  Dr. Reynold Bowen - Endocrinology  ED/Hospital/Urgent Care Visits: Per the patient, no recent visits to the ED/Hospital or Urgent Care.  To Discuss with Provider: No concerns at the time of call. No problems updated.  OBJECTIVE:  VS:  HT:5\' 2"  (157.5 cm)   WT:187 lb 3.2 oz (84.9 kg)  BMI:34.23    BP:124/74  HR:80bpm  TEMP: ( )  RESP:95 %   PHYSICAL EXAM: Constitutional: WDWN, Non-toxic appearing. Psychiatric: Alert & appropriately interactive.  Not depressed or anxious appearing. Respiratory: No increased work of breathing.  Trachea Midline Eyes: Pupils are equal.  EOM intact without nystagmus.  No scleral icterus  Vascular Exam:  warm to touch no edema  upper extremity neuro exam: unremarkable  MSK Exam: Moderately tender with axial load and circumduction.  There is positive crepitation.  Intrinsic rotator cuff strength testing is intact.     ASSESSMENT & PLAN:   1. Bilateral shoulder pain, unspecified chronicity   2. Right shoulder pain, unspecified chronicity   3. Primary osteoarthritis of both shoulders   4. Shoulder ankylosis, left     PLAN: Large-volume intra-articular injection performed today.  She has degenerative changes in both shoulders but worse on the left than on the right.  Follow-up as needed.  Follow-up: Return if symptoms worsen or fail to improve.        Please see additional documentation for Objective, Assessment and Plan sections. Pertinent additional documentation may be included in corresponding procedure notes, imaging studies, problem based documentation and patient instructions. Please see these sections of the encounter for additional information regarding this visit.  CMA/ATC served as Education administrator during this visit. History, Physical, and Plan performed by medical provider. Documentation and orders reviewed and attested to.      [name], [place] [facility] Physician

## 2018-04-25 NOTE — Patient Instructions (Signed)

## 2018-04-25 NOTE — Procedures (Signed)
PROCEDURE NOTE:  Ultrasound Guided: Injection: Left shoulder, intra-articular Images were obtained and interpreted by myself, Teresa Coombs, DO  Images have been saved and stored to PACS system. Images obtained on: GE S7 Ultrasound machine    ULTRASOUND FINDINGS:  Moderate degenerative change.  DESCRIPTION OF PROCEDURE:  The patient's clinical condition is marked by substantial pain and/or significant functional disability. Other conservative therapy has not provided relief, is contraindicated, or not appropriate. There is a reasonable likelihood that injection will significantly improve the patient's pain and/or functional impairment.   After discussing the risks, benefits and expected outcomes of the injection and all questions were reviewed and answered, the patient wished to undergo the above named procedure.  Verbal consent was obtained.  The ultrasound was used to identify the target structure and adjacent neurovascular structures. The skin was then prepped in sterile fashion and the target structure was injected under direct visualization using sterile technique as below:  PREP: Alcohol and Ethel Chloride APPROACH: posterior, single injection, 21g 2 in. INJECTATE: 2 cc 0.5% Marcaine and 2 cc 40mg /mL DepoMedrol ASPIRATE: None DRESSING: Band-Aid  Post procedural instructions including recommending icing and warning signs for infection were reviewed.    This procedure was well tolerated and there were no complications.   IMPRESSION: Succesful Ultrasound Guided: Injection

## 2018-05-02 ENCOUNTER — Ambulatory Visit (INDEPENDENT_AMBULATORY_CARE_PROVIDER_SITE_OTHER): Payer: PPO | Admitting: Family Medicine

## 2018-05-03 ENCOUNTER — Ambulatory Visit (INDEPENDENT_AMBULATORY_CARE_PROVIDER_SITE_OTHER): Payer: PPO | Admitting: Family Medicine

## 2018-05-03 VITALS — BP 125/66 | HR 63 | Temp 98.0°F | Ht 62.0 in | Wt 181.0 lb

## 2018-05-03 DIAGNOSIS — I10 Essential (primary) hypertension: Secondary | ICD-10-CM | POA: Diagnosis not present

## 2018-05-03 DIAGNOSIS — E669 Obesity, unspecified: Secondary | ICD-10-CM | POA: Diagnosis not present

## 2018-05-03 DIAGNOSIS — R7303 Prediabetes: Secondary | ICD-10-CM | POA: Diagnosis not present

## 2018-05-03 DIAGNOSIS — Z6833 Body mass index (BMI) 33.0-33.9, adult: Secondary | ICD-10-CM

## 2018-05-03 DIAGNOSIS — E66811 Obesity, class 1: Secondary | ICD-10-CM

## 2018-05-04 NOTE — Progress Notes (Signed)
Office: [phone]  /  Fax: [phone]   HPI:   Chief Complaint: OBESITY Jackie Stevens is here to discuss her progress with her obesity treatment plan. She is on the keep a food journal with 300-450 calories and 30 grams of protein at lunch daily and follow the Category 2 plan and is following her eating plan approximately 90 % of the time. She states she is at [facility] and doing chair exercise for 60 minutes 2 times per week. Jackie Stevens is not weighing meat at dinner and definitely not getting enough protein at night. Feels full at breakfast and lunch.  Her weight is 181 lb (82.1 kg) today and has had a weight loss of 3 pounds over a period of 2 weeks since her last visit. She has lost 23 lbs since starting treatment with [place].  Pre-Diabetes Jackie Stevens has a diagnosis of pre-diabetes based on her elevated Hgb A1c and was informed this puts her at greater risk of developing diabetes. She notes carbohydrate cravings and denies hunger except after dinner. She is not taking metformin currently and continues to work on diet and exercise to decrease risk of diabetes. She denies nausea or hypoglycemia.  Hypertension Jackie Stevens is a 72 y.o. female with hypertension. Jackie Stevens's blood pressure is controlled today. She denies chest pain, chest pressure, or headache. She is working weight loss to help control her blood pressure with the goal of decreasing her risk of heart attack and stroke.  ALLERGIES: Allergies  Allergen Reactions  . Neomycin-Bacitracin Zn-Polymyx Rash    Polysporin- is tolerated   . Niacin Other (See Comments) and Cough    "cough til I threw up" (12/19/2012)  . Ciprofloxacin Hives    Got cipro and flagyl at same time, localized hives to IV arm  . Flagyl [Metronidazole] Hives    Got cipro and flagyl at same time, localized hives to IV arm    MEDICATIONS: Current Outpatient Medications on File Prior to Visit  Medication Sig Dispense Refill  . amLODipine (NORVASC) 2.5 MG tablet Take 1 tablet  (2.5 mg total) by mouth 2 (two) times daily. 180 tablet 1  . docusate sodium (COLACE) 100 MG capsule Take 100 mg by mouth 2 (two) times daily. Reported on 03/30/2016    . fluticasone (FLONASE) 50 MCG/ACT nasal spray Place 2 sprays into both nostrils daily. 48 g 3  . gabapentin (NEURONTIN) 100 MG capsule TAKE 2 CAPSULES BY MOUTH AT BEDTIME 180 capsule 0  . hydrochlorothiazide (HYDRODIURIL) 25 MG tablet Take 1 tablet (25 mg total) by mouth daily. 90 tablet 1  . KETOCONAZOLE, TOPICAL, 1 % SHAM Apply 1 Dose topically once a week. 200 mL 1  . KRILL OIL PO Take 750 mg by mouth daily.     Marland Kitchen levothyroxine (SYNTHROID, LEVOTHROID) 125 MCG tablet Take 1 tablet (125 mcg total) by mouth daily. 90 tablet 0  . losartan (COZAAR) 100 MG tablet TAKE 1 TABLET BY MOUTH EVERY DAY 90 tablet 1  . Multiple Vitamins-Minerals (MULTIVITAMIN WOMEN 50+ PO) Take 1 tablet by mouth daily.    . naproxen (NAPROSYN) 375 MG tablet Take 1 tablet (375 mg total) by mouth 2 (two) times daily with a meal. 180 tablet 1  . NITROSTAT 0.4 MG SL tablet PLACE 1 TABLET UNDER THE TONGUE EVERY 5 MINUTES AS NEEDED FOR CHEST PAIN 25 tablet 0  . omeprazole (PRILOSEC) 40 MG capsule Take 1 capsule (40 mg total) by mouth 2 (two) times daily. 180 capsule 1  . Polyethyl Glycol-Propyl Glycol  0.4-0.3 % GEL Apply 1 drop to eye 2 (two) times daily as needed (dry eyes).     . polyethylene glycol (MIRALAX / GLYCOLAX) packet Take 17 g by mouth daily.    . Probiotic Product (PROBIOTIC ADVANCED PO) Take 2 tablets by mouth at bedtime. Digestive Advantage    . psyllium (REGULOID) 0.52 G capsule Take 0.52 g by mouth at bedtime.    . ranitidine (ZANTAC) 300 MG tablet TAKE 1 TABLET BY MOUTH EVERYDAY AT BEDTIME 90 tablet 1  . simvastatin (ZOCOR) 40 MG tablet TAKE 1 TABLET BY MOUTH EVERYDAY AT BEDTIME 90 tablet 0  . sodium chloride (OCEAN) 0.65 % SOLN nasal spray Place 1 spray into both nostrils 2 (two) times daily as needed for congestion.     . TURMERIC PO Take 1  tablet by mouth daily. Reported on 12/19/2015    . vitamin B-12 (CYANOCOBALAMIN) 1000 MCG tablet Take 100 mcg by mouth every other day.    . Vitamin D, Cholecalciferol, 1000 UNITS CAPS Take 1 capsule by mouth daily.     No current facility-administered medications on file prior to visit.     PAST MEDICAL HISTORY: Past Medical History:  Diagnosis Date  . Abnormal cervical cytology 10/25/2012   Follows with Dr Leavy Cella of Gyn  . Allergic rhinitis   . Anemia 10/06/2013  . Anginal pain ([place])    pt has history of CP states had cardiac workup with no specific issues identified   . Anxiety    when increased stress   . Arthritis    "knees; right thumb; shoulders" (2013/01/14)  . Asthma   . Chest pain   . Chicken pox as a child  . Complication of anesthesia   . Constipation   . Decreased hearing   . Dermatitis 03/06/2017  . Diverticulitis 10/25/2012   pt. reports that a drain was placed - 09/2012    . Dry mouth   . Excessive thirst   . External hemorrhoid, bleeding    "sometimes" (2013-01-14)  . Frequent urination   . GERD (gastroesophageal reflux disease)   . H/O hiatal hernia   . Hand tingling   . Heart murmur   . HTN (hypertension)    stress test completed by Anselm Lis, diagnosed as GERD  . Hyperlipidemia   . Hypothyroidism   . Incontinence   . Infertility, female   . Insomnia   . Joint pain   . Kidney stones 1970's   "passed on their own" (01/14/2013)  . Knee pain   . Left shoulder pain 09/14/2016  . Low back pain 06/09/2014  . Measles as a child  . Medicare annual wellness visit, subsequent 10/06/2013   Steinhoffer of Dermatology Pneumovax in 2012   . Memory difficulties 09/05/2017  . Obesity 09/11/2017  . OSA on CPAP   . Palpitations   . Paresthesia 03/23/2015   Left face  . PONV (postoperative nausea and vomiting)   . Preventative health care 10/06/2013   Steinhoffer of Dermatology Pneumovax in 2012  . Preventative health care 02/26/2016  . Rheumatoid  arteritis   . Shortness of breath dyspnea    using stairs  . Sinus pain   . Sleep apnea   . Swelling of both lower extremities   . Thyroid cancer ([place]) 1980's  . Thyroid disease   . Tinnitus   . UTI (urinary tract infection) 04/02/2013    PAST SURGICAL HISTORY: Past Surgical History:  Procedure Laterality Date  . CHOLECYSTECTOMY  1990  .  COLON SURGERY    . COLOSTOMY REVISION  12/19/2012   Procedure: COLON RESECTION SIGMOID;  Surgeon: Gwenyth Ober, MD;  Location: [facility];  Service: General;  Laterality: N/A;  . CYSTOSCOPY WITH STENT PLACEMENT  12/19/2012   Procedure: CYSTOSCOPY WITH STENT PLACEMENT;  Surgeon: Hanley Ben, MD;  Location: [facility];  Service: Urology;  Laterality: N/A;  . DILATION AND CURETTAGE OF UTERUS  1960's   "lots of them; had miscarriages" (12/19/2012)  . LYSIS OF ADHESION N/A 12/02/2015   Procedure: LAPAROSCOPIC LYSIS OF ADHESION;  Surgeon: Johnathan Hausen, MD;  Location: [facility];  Service: General;  Laterality: N/A;  . ROBOTIC ASSISTED BILATERAL SALPINGO OOPHERECTOMY Bilateral 12/02/2015   Procedure: XI ROBOTIC ASSISTED BILATERAL SALPINGO OOPHORECTOMY;  Surgeon: Everitt Amber, MD;  Location: [facility];  Service: Gynecology;  Laterality: Bilateral;  . SIGMOID RESECTION / RECTOPEXY  12/19/2012  . THYROIDECTOMY, PARTIAL  1988   "then did iodine to remove the rest" (12/19/2012)  . TONSILLECTOMY  1951?  . TRANSRECTAL DRAINAGE OF PELVIC ABSCESS  10/27/2012  . VAGINAL HYSTERECTOMY  1970's   "still have my ovaries" (12/19/2012)    SOCIAL HISTORY: Social History   Tobacco Use  . Smoking status: Never Smoker  . Smokeless tobacco: Never Used  Substance Use Topics  . Alcohol use: No    Alcohol/week: 0.0 oz  . Drug use: No    FAMILY HISTORY: Family History  Problem Relation Age of Onset  . Heart disease Father   . Pneumonia Father   . Hypertension Father   . Hyperlipidemia Father   . Cancer Father        skin  . Stroke Father   . Alzheimer's disease Mother   . Heart  disease Mother   . Depression Mother   . Emphysema Brother        marijuana and cigarettes  . Alcohol abuse Brother   . Hearing loss Brother   . Diabetes Maternal Grandmother   . Alzheimer's disease Paternal Grandmother   . Cancer Paternal Grandmother        lung?- smoker  . Hyperlipidemia Paternal Grandmother   . Heart attack Paternal Grandfather   . Alcohol abuse Paternal Grandfather   . Neurofibromatosis Son        schwanomatosis  . Neurofibromatosis Son        swanomatosis  . Cancer Paternal Aunt     ROS: Review of Systems  Constitutional: Positive for weight loss.  Cardiovascular: Negative for chest pain.       Negative chest pressure  Gastrointestinal: Negative for nausea.  Neurological: Negative for headaches.  Endo/Heme/Allergies:       Negative hypoglycemia    PHYSICAL EXAM: Blood pressure 125/66, pulse 63, temperature 98 F (36.7 C), temperature source Oral, height 5\' 2"  (1.575 m), weight 181 lb (82.1 kg), SpO2 98 %. Body mass index is 33.11 kg/m. Physical Exam  Constitutional: She is oriented to person, place, and time. She appears well-developed and well-nourished.  Cardiovascular: Normal rate.  Pulmonary/Chest: Effort normal.  Musculoskeletal: Normal range of motion.  Neurological: She is oriented to person, place, and time.  Skin: Skin is warm and dry.  Psychiatric: She has a normal mood and affect. Her behavior is normal.  Vitals reviewed.   RECENT LABS AND TESTS: BMET    Component Value Date/Time   NA 137 02/24/2018 0829   K 4.0 02/24/2018 0829   CL 99 02/24/2018 0829   CO2 32 02/24/2018 0829   GLUCOSE 102 (H) 02/24/2018 2355  BUN 16 02/24/2018 0829   CREATININE 0.69 02/24/2018 0829   CREATININE 0.75 05/28/2014 0836   CALCIUM 8.8 02/24/2018 0829   GFRNONAA >60 12/03/2015 0522   GFRAA >60 12/03/2015 0522   Lab Results  Component Value Date   HGBA1C 5.8 (H) 07/18/2011   Lab Results  Component Value Date   INSULIN 7.9 02/28/2018    CBC    Component Value Date/Time   WBC 6.4 02/24/2018 0829   RBC 4.19 02/24/2018 0829   HGB 12.3 02/24/2018 0829   HCT 36.5 02/24/2018 0829   PLT 281.0 02/24/2018 0829   MCV 87.1 02/24/2018 0829   MCH 28.7 12/03/2015 0522   MCHC 33.6 02/24/2018 0829   RDW 13.7 02/24/2018 0829   LYMPHSABS 1.5 12/14/2017 0935   MONOABS 0.5 12/14/2017 0935   EOSABS 0.4 12/14/2017 0935   BASOSABS 0.0 12/14/2017 0935   Iron/TIBC/Ferritin/ %Sat    Component Value Date/Time   IRON 33 09/15/2015 0618   TIBC 249 (L) 09/15/2015 0618   FERRITIN 52 09/15/2015 0618   IRONPCTSAT 13 09/15/2015 0618   IRONPCTSAT 22 07/02/2013 1056   Lipid Panel     Component Value Date/Time   CHOL 141 12/14/2017 0935   TRIG 107.0 12/14/2017 0935   HDL 46.60 12/14/2017 0935   CHOLHDL 3 12/14/2017 0935   VLDL 21.4 12/14/2017 0935   LDLCALC 73 12/14/2017 0935   Hepatic Function Panel     Component Value Date/Time   PROT 6.4 02/24/2018 0829   ALBUMIN 3.9 02/24/2018 0829   AST 17 02/24/2018 0829   ALT 19 02/24/2018 0829   ALKPHOS 76 02/24/2018 0829   BILITOT 0.3 02/24/2018 0829   BILIDIR 0.0 11/01/2014 1116   IBILI 0.3 05/28/2014 0836      Component Value Date/Time   TSH 0.49 02/24/2018 0829   TSH 0.40 12/14/2017 0935   TSH 0.18 (L) 09/05/2017 1509    ASSESSMENT AND PLAN: Prediabetes  Essential hypertension  Class 1 obesity with serious comorbidity and body mass index (BMI) of 33.0 to 33.9 in adult, unspecified obesity type  PLAN:  Pre-Diabetes Jackie Stevens will continue Category 2 plan and continue to work on weight loss, exercise, and decreasing simple carbohydrates in her diet to help decrease the risk of diabetes. We dicussed metformin including benefits and risks. She was informed that eating too many simple carbohydrates or too many calories at one sitting increases the likelihood of GI side effects. Jackie Stevens declined metformin for now and a prescription was not written today. Jackie Stevens agrees to follow up  with our clinic in 2 weeks as directed to monitor her progress.  Hypertension We discussed sodium restriction, working on healthy weight loss, and a regular exercise program as the means to achieve improved blood pressure control. Jackie Stevens agreed with this plan and agreed to follow up as directed. We will continue to monitor her blood pressure as well as her progress with the above lifestyle modifications. She will continue her medications as prescribed and will watch for signs of hypotension as she continues her lifestyle modifications. Jackie Stevens agrees to follow up with our clinic in 2 weeks.  We spent > than 50% of the 15 minute visit on the counseling as documented in the note.  Obesity Jackie Stevens is currently in the action stage of change. As such, her goal is to continue with weight loss efforts She has agreed to follow the Category 2 plan Jackie Stevens has been instructed to work up to a goal of 150 minutes of combined cardio and  strengthening exercise per week for weight loss and overall health benefits. We discussed the following Behavioral Modification Strategies today: increasing lean protein intake, work on meal planning and easy cooking plans, better snacking choices, and planning for success   Jackie Stevens has agreed to follow up with our clinic in 2 weeks. She was informed of the importance of frequent follow up visits to maximize her success with intensive lifestyle modifications for her multiple health conditions.   OBESITY BEHAVIORAL INTERVENTION VISIT  Today's visit was # 5 out of 22.  Starting weight: 204 lbs Starting date: 02/28/18 Today's weight : 181 lbs  Today's date: 05/03/2018 Total lbs lost to date: 23 (Patients must lose 7 lbs in the first 6 months to continue with counseling)   ASK: We discussed the diagnosis of obesity with Jackie Stevens today and Jackie Stevens agreed to give [place] permission to discuss obesity behavioral modification therapy today.  ASSESS: Jackie Stevens has the diagnosis of  obesity and her BMI today is 33.1 Jackie Stevens is in the action stage of change   ADVISE: Jackie Stevens was educated on the multiple health risks of obesity as well as the benefit of weight loss to improve her health. She was advised of the need for long term treatment and the importance of lifestyle modifications.  AGREE: Multiple dietary modification options and treatment options were discussed and  Jackie Stevens agreed to the above obesity treatment plan.  I, Trixie Dredge, am acting as transcriptionist for Jackie Qua, MD  I have reviewed the above documentation for accuracy and completeness, and I agree with the above. - Jackie Qua, MD

## 2018-05-17 DIAGNOSIS — G4733 Obstructive sleep apnea (adult) (pediatric): Secondary | ICD-10-CM | POA: Diagnosis not present

## 2018-05-22 NOTE — Progress Notes (Signed)
Subjective:   Jackie Stevens is a 72 y.o. female who presents for Medicare Annual (Subsequent) preventive examination.  Review of Systems: No ROS.  Medicare Wellness Visit. Additional risk factors are reflected in the social history. Cardiac Risk Factors include: advanced age (>46men, >85 women);dyslipidemia;hypertension Sleep patterns: Sleeps well. Home Safety/Smoke Alarms: Feels safe in home. Smoke alarms in place.  Living environment; residence and Firearm Safety: Lives with husband and adult son.   Female:        Mammo-utd       Dexa scan-utd        CCS-12/20/13    Objective:     Vitals: BP (!) 141/70 (BP Location: Left Wrist, Patient Position: Sitting, Cuff Size: Normal)   Pulse 72   Ht 5\' 2"  (1.575 m)   Wt 181 lb 12.8 oz (82.5 kg)   SpO2 98%   BMI 33.25 kg/m   Body mass index is 33.25 kg/m.  Advanced Directives 05/25/2018 05/24/2017 12/02/2015 12/02/2015 11/25/2015 09/14/2015 09/14/2015  Does Patient Have a Medical Advance Directive? Yes Yes Yes Yes Yes Yes No  Type of Paramedic of [place];Living will Living will;Healthcare Power of [place];Living will [place];Living will Living will;Healthcare Power of [place];Living will -  Does patient want to make changes to medical advance directive? - No - Patient declined - No - Patient declined No - Patient declined No - Patient declined -  Copy of [place] in Chart? Yes Yes No - copy requested No - copy requested No - copy requested Yes -  Would patient like information on creating a medical advance directive? - - - - - - No - patient declined information  Pre-existing out of facility DNR order (yellow form or pink MOST form) - - - - - - -    Tobacco Social History   Tobacco Use  Smoking Status Never Smoker  Smokeless Tobacco Never Used     Counseling given: Not Answered   Clinical Intake: Pain :  No/denies pain   Past Medical History:  Diagnosis Date  . Abnormal cervical cytology 10/25/2012   Follows with Dr Leavy Cella of Gyn  . Allergic rhinitis   . Anemia 10/06/2013  . Anginal pain ([place])    pt has history of CP states had cardiac workup with no specific issues identified   . Anxiety    when increased stress   . Arthritis    "knees; right thumb; shoulders" (December 28, 2012)  . Asthma   . Chest pain   . Chicken pox as a child  . Complication of anesthesia   . Constipation   . Decreased hearing   . Dermatitis 03/06/2017  . Diverticulitis 10/25/2012   pt. reports that a drain was placed - 09/2012    . Dry mouth   . Excessive thirst   . External hemorrhoid, bleeding    "sometimes" (2012-12-28)  . Frequent urination   . GERD (gastroesophageal reflux disease)   . H/O hiatal hernia   . Hand tingling   . Heart murmur   . HTN (hypertension)    stress test completed by Anselm Lis, diagnosed as GERD  . Hyperlipidemia   . Hypothyroidism   . Incontinence   . Infertility, female   . Insomnia   . Joint pain   . Kidney stones 1970's   "passed on their own" (December 28, 2012)  . Knee pain   . Left shoulder pain 09/14/2016  .  Low back pain 06/09/2014  . Measles as a child  . Medicare annual wellness visit, subsequent 10/06/2013   Steinhoffer of Dermatology Pneumovax in 2012   . Memory difficulties 09/05/2017  . Obesity 09/11/2017  . OSA on CPAP   . Palpitations   . Paresthesia 03/23/2015   Left face  . PONV (postoperative nausea and vomiting)   . Preventative health care 10/06/2013   Steinhoffer of Dermatology Pneumovax in 2012  . Preventative health care 02/26/2016  . Rheumatoid arteritis   . Shortness of breath dyspnea    using stairs  . Sinus pain   . Sleep apnea   . Swelling of both lower extremities   . Thyroid cancer ([place]) 1980's  . Thyroid disease   . Tinnitus   . UTI (urinary tract infection) 04/02/2013   Past Surgical History:  Procedure Laterality Date  .  CHOLECYSTECTOMY  1990  . COLON SURGERY    . COLOSTOMY REVISION  12/19/2012   Procedure: COLON RESECTION SIGMOID;  Surgeon: Gwenyth Ober, MD;  Location: [facility];  Service: General;  Laterality: N/A;  . CYSTOSCOPY WITH STENT PLACEMENT  12/19/2012   Procedure: CYSTOSCOPY WITH STENT PLACEMENT;  Surgeon: Hanley Ben, MD;  Location: [facility];  Service: Urology;  Laterality: N/A;  . DILATION AND CURETTAGE OF UTERUS  1960's   "lots of them; had miscarriages" (12/19/2012)  . LYSIS OF ADHESION N/A 12/02/2015   Procedure: LAPAROSCOPIC LYSIS OF ADHESION;  Surgeon: Johnathan Hausen, MD;  Location: [facility];  Service: General;  Laterality: N/A;  . ROBOTIC ASSISTED BILATERAL SALPINGO OOPHERECTOMY Bilateral 12/02/2015   Procedure: XI ROBOTIC ASSISTED BILATERAL SALPINGO OOPHORECTOMY;  Surgeon: Everitt Amber, MD;  Location: [facility];  Service: Gynecology;  Laterality: Bilateral;  . SIGMOID RESECTION / RECTOPEXY  12/19/2012  . THYROIDECTOMY, PARTIAL  1988   "then did iodine to remove the rest" (12/19/2012)  . TONSILLECTOMY  1951?  . TRANSRECTAL DRAINAGE OF PELVIC ABSCESS  10/27/2012  . VAGINAL HYSTERECTOMY  1970's   "still have my ovaries" (12/19/2012)   Family History  Problem Relation Age of Onset  . Heart disease Father   . Pneumonia Father   . Hypertension Father   . Hyperlipidemia Father   . Cancer Father        skin  . Stroke Father   . Alzheimer's disease Mother   . Heart disease Mother   . Depression Mother   . Emphysema Brother        marijuana and cigarettes  . Alcohol abuse Brother   . Hearing loss Brother   . Diabetes Maternal Grandmother   . Alzheimer's disease Paternal Grandmother   . Cancer Paternal Grandmother        lung?- smoker  . Hyperlipidemia Paternal Grandmother   . Heart attack Paternal Grandfather   . Alcohol abuse Paternal Grandfather   . Neurofibromatosis Son        schwanomatosis  . Neurofibromatosis Son        swanomatosis  . Cancer Paternal Aunt    Social History    Socioeconomic History  . Marital status: Married    Spouse name: [name]  . Number of children: 2  . Years of education: Not on file  . Highest education level: Not on file  Occupational History  . Occupation: Retired  Scientific laboratory technician  . Financial resource strain: Not on file  . Food insecurity:    Worry: Not on file    Inability: Not on file  . Transportation needs:  Medical: Not on file    Non-medical: Not on file  Tobacco Use  . Smoking status: Never Smoker  . Smokeless tobacco: Never Used  Substance and Sexual Activity  . Alcohol use: No    Alcohol/week: 0.0 oz  . Drug use: No  . Sexual activity: Not Currently  Lifestyle  . Physical activity:    Days per week: Not on file    Minutes per session: Not on file  . Stress: Not on file  Relationships  . Social connections:    Talks on phone: Not on file    Gets together: Not on file    Attends religious service: Not on file    Active member of club or organization: Not on file    Attends meetings of clubs or organizations: Not on file    Relationship status: Not on file  Other Topics Concern  . Not on file  Social History Narrative   Married    children    Outpatient Encounter Medications as of 05/25/2018  Medication Sig  . amLODipine (NORVASC) 2.5 MG tablet Take 1 tablet (2.5 mg total) by mouth 2 (two) times daily.  Marland Kitchen docusate sodium (COLACE) 100 MG capsule Take 100 mg by mouth 2 (two) times daily. Reported on 03/30/2016  . fluticasone (FLONASE) 50 MCG/ACT nasal spray Place 2 sprays into both nostrils daily.  Marland Kitchen gabapentin (NEURONTIN) 100 MG capsule TAKE 2 CAPSULES BY MOUTH AT BEDTIME  . hydrochlorothiazide (HYDRODIURIL) 25 MG tablet Take 1 tablet (25 mg total) by mouth daily.  Marland Kitchen KETOCONAZOLE, TOPICAL, 1 % SHAM Apply 1 Dose topically once a week.  Marland Kitchen KRILL OIL PO Take 750 mg by mouth daily.   Marland Kitchen levothyroxine (SYNTHROID, LEVOTHROID) 125 MCG tablet Take 1 tablet (125 mcg total) by mouth daily.  Marland Kitchen losartan (COZAAR) 100  MG tablet TAKE 1 TABLET BY MOUTH EVERY DAY  . Multiple Vitamins-Minerals (MULTIVITAMIN WOMEN 50+ PO) Take 1 tablet by mouth daily.  . naproxen (NAPROSYN) 375 MG tablet Take 1 tablet (375 mg total) by mouth 2 (two) times daily with a meal.  . NITROSTAT 0.4 MG SL tablet PLACE 1 TABLET UNDER THE TONGUE EVERY 5 MINUTES AS NEEDED FOR CHEST PAIN  . omeprazole (PRILOSEC) 40 MG capsule Take 1 capsule (40 mg total) by mouth 2 (two) times daily.  Vladimir Faster Glycol-Propyl Glycol 0.4-0.3 % GEL Apply 1 drop to eye 2 (two) times daily as needed (dry eyes).   . polyethylene glycol (MIRALAX / GLYCOLAX) packet Take 17 g by mouth daily.  . Probiotic Product (PROBIOTIC ADVANCED PO) Take 2 tablets by mouth at bedtime. Digestive Advantage  . psyllium (REGULOID) 0.52 G capsule Take 0.52 g by mouth at bedtime.  . ranitidine (ZANTAC) 300 MG tablet TAKE 1 TABLET BY MOUTH EVERYDAY AT BEDTIME  . simvastatin (ZOCOR) 40 MG tablet TAKE 1 TABLET BY MOUTH EVERYDAY AT BEDTIME  . sodium chloride (OCEAN) 0.65 % SOLN nasal spray Place 1 spray into both nostrils 2 (two) times daily as needed for congestion.   . TURMERIC PO Take 1 tablet by mouth daily. Reported on 12/19/2015  . Vitamin D, Cholecalciferol, 1000 UNITS CAPS Take 1 capsule by mouth daily.  . vitamin B-12 (CYANOCOBALAMIN) 1000 MCG tablet Take 100 mcg by mouth every other day.   No facility-administered encounter medications on file as of 05/25/2018.     Activities of Daily Living In your present state of health, do you have any difficulty performing the following activities: 05/25/2018  Hearing? N  Vision? N  Comment Dr.Miller yearly.  Difficulty concentrating or making decisions? N  Walking or climbing stairs? Y  Dressing or bathing? N  Doing errands, shopping? N  Preparing Food and eating ? N  Using the Toilet? N  In the past six months, have you accidently leaked urine? Y  Comment wears pads  Do you have problems with loss of bowel control? N  Managing your  Medications? N  Managing your Finances? N  Housekeeping or managing your Housekeeping? N  Some recent data might be hidden    Patient Care Team: Mosie Lukes, MD as PCP - General (Family Medicine) Marylynn Pearson, MD as Consulting Physician (Obstetrics and Gynecology) Marica Otter, [place] as Consulting Physician (Optometry) Reynold Bowen, MD as Consulting Physician (Endocrinology) Gaynelle Arabian, MD as Consulting Physician (Orthopedic Surgery) Meylor, Marlou Sa, [place] as Consulting Physician (Chiropractic Medicine) Sydnee Levans, MD as Consulting Physician (Dermatology)    Assessment:   This is a routine wellness examination for [place]. Physical assessment deferred to PCP.   Exercise Activities and Dietary recommendations Current Exercise Habits: The patient does not participate in regular exercise at present, Exercise limited by: None identified Diet (meal preparation, eat out, water intake, caffeinated beverages, dairy products, fruits and vegetables): well balanced, on average, 3 meals per day       Goals    . Increase physical activity    . Lose 20 lbs  (pt-stated)       Fall Risk Fall Risk  05/25/2018 05/24/2017 03/01/2017 09/14/2016 08/20/2015  Falls in the past year? No No No No No    Depression Screen PHQ 2/9 Scores 05/25/2018 02/28/2018 05/24/2017 03/01/2017  PHQ - 2 Score 0 0 0 0  PHQ- 9 Score - 7 - -     Cognitive Function MMSE - Mini Mental State Exam 05/25/2018 05/24/2017 08/20/2015  Orientation to time 5 5 5   Orientation to Place 5 5 5   Registration 3 3 3   Attention/ Calculation 5 5 5   Recall 2 3 3   Language- name 2 objects 2 2 2   Language- repeat 1 1 1   Language- follow 3 step command 3 3 3   Language- read & follow direction 1 1 1   Write a sentence 1 1 1   Copy design 1 1 1   Total score 29 30 30         Immunization History  Administered Date(s) Administered  . Influenza Split 09/12/2012, 10/02/2015  . Influenza, High Dose Seasonal PF 09/14/2016, 09/05/2017   . Influenza,inj,Quad PF,6+ Mos 08/30/2013, 10/21/2014, 08/20/2015  . Pneumococcal Conjugate-13 07/19/2011, 11/01/2014  . Pneumococcal Polysaccharide-23 03/30/2016  . Tdap 10/02/2013  . Zoster 12/09/2008    Screening Tests Health Maintenance  Topic Date Due  . Hepatitis C Screening  October 03, 1946  . INFLUENZA VACCINE  07/13/2018  . MAMMOGRAM  08/31/2019  . TETANUS/TDAP  10/03/2023  . COLONOSCOPY  12/21/2023  . DEXA SCAN  Completed  . PNA vac Low Risk Adult  Completed      Plan:    Please schedule your next medicare wellness visit with me in 1 yr.  Follow up with Dr.Blyth as scheduled  Continue to eat heart healthy diet (full of fruits, vegetables, whole grains, lean protein, water--limit salt, fat, and sugar intake) and increase physical activity as tolerated.  Continue doing brain stimulating activities (puzzles, reading, adult coloring books, staying active) to keep memory sharp.   I have personally reviewed and noted the following in the patient's chart:   . Medical and social history .  Use of alcohol, tobacco or illicit drugs  . Current medications and supplements . Functional ability and status . Nutritional status . Physical activity . Advanced directives . List of other physicians . Hospitalizations, surgeries, and ER visits in previous 12 months . Vitals . Screenings to include cognitive, depression, and falls . Referrals and appointments  In addition, I have reviewed and discussed with patient certain preventive protocols, quality metrics, and best practice recommendations. A written personalized care plan for preventive services as well as general preventive health recommendations were provided to patient.     Shela Nevin, [place]  05/25/2018

## 2018-05-25 ENCOUNTER — Encounter: Payer: Self-pay | Admitting: *Deleted

## 2018-05-25 ENCOUNTER — Ambulatory Visit (INDEPENDENT_AMBULATORY_CARE_PROVIDER_SITE_OTHER): Payer: PPO | Admitting: *Deleted

## 2018-05-25 VITALS — BP 141/70 | HR 72 | Ht 62.0 in | Wt 181.8 lb

## 2018-05-25 DIAGNOSIS — Z0000 Encounter for general adult medical examination without abnormal findings: Secondary | ICD-10-CM | POA: Diagnosis not present

## 2018-05-25 NOTE — Patient Instructions (Signed)
Please schedule your next medicare wellness visit with me in 1 yr.  Follow up with Dr.Blyth as scheduled  Continue to eat heart healthy diet (full of fruits, vegetables, whole grains, lean protein, water--limit salt, fat, and sugar intake) and increase physical activity as tolerated.  Continue doing brain stimulating activities (puzzles, reading, adult coloring books, staying active) to keep memory sharp.    Jackie Stevens , Thank you for taking time to come for your Medicare Wellness Visit. I appreciate your ongoing commitment to your health goals. Please review the following plan we discussed and let me know if I can assist you in the future.   These are the goals we discussed: Goals    . Increase physical activity    . Lose 20 lbs  (pt-stated)       This is a list of the screening recommended for you and due dates:  Health Maintenance  Topic Date Due  .  Hepatitis C: One time screening is recommended by Center for Disease Control  (CDC) for  adults born from [birth year] through 1967.   01/12/1946  . Flu Shot  07/13/2018  . Mammogram  08/31/2019  . Tetanus Vaccine  10/03/2023  . Colon Cancer Screening  12/21/2023  . DEXA scan (bone density measurement)  Completed  . Pneumonia vaccines  Completed    Health Maintenance for Postmenopausal Women Menopause is a normal process in which your reproductive ability comes to an end. This process happens gradually over a span of months to years, usually between the ages of 75 and 39. Menopause is complete when you have missed 12 consecutive menstrual periods. It is important to talk with your health care provider about some of the most common conditions that affect postmenopausal women, such as heart disease, cancer, and bone loss (osteoporosis). Adopting a healthy lifestyle and getting preventive care can help to promote your health and wellness. Those actions can also lower your chances of developing some of these common conditions. What should I know  about menopause? During menopause, you may experience a number of symptoms, such as:  Moderate-to-severe hot flashes.  Night sweats.  Decrease in sex drive.  Mood swings.  Headaches.  Tiredness.  Irritability.  Memory problems.  Insomnia.  Choosing to treat or not to treat menopausal changes is an individual decision that you make with your health care provider. What should I know about hormone replacement therapy and supplements? Hormone therapy products are effective for treating symptoms that are associated with menopause, such as hot flashes and night sweats. Hormone replacement carries certain risks, especially as you become older. If you are thinking about using estrogen or estrogen with progestin treatments, discuss the benefits and risks with your health care provider. What should I know about heart disease and stroke? Heart disease, heart attack, and stroke become more likely as you age. This may be due, in part, to the hormonal changes that your body experiences during menopause. These can affect how your body processes dietary fats, triglycerides, and cholesterol. Heart attack and stroke are both medical emergencies. There are many things that you can do to help prevent heart disease and stroke:  Have your blood pressure checked at least every 1-2 years. High blood pressure causes heart disease and increases the risk of stroke.  If you are 51-16 years old, ask your health care provider if you should take aspirin to prevent a heart attack or a stroke.  Do not use any tobacco products, including cigarettes, chewing tobacco, or electronic  cigarettes. If you need help quitting, ask your health care provider.  It is important to eat a healthy diet and maintain a healthy weight. ? Be sure to include plenty of vegetables, fruits, low-fat dairy products, and lean protein. ? Avoid eating foods that are high in solid fats, added sugars, or salt (sodium).  Get regular exercise.  This is one of the most important things that you can do for your health. ? Try to exercise for at least 150 minutes each week. The type of exercise that you do should increase your heart rate and make you sweat. This is known as moderate-intensity exercise. ? Try to do strengthening exercises at least twice each week. Do these in addition to the moderate-intensity exercise.  Know your numbers.Ask your health care provider to check your cholesterol and your blood glucose. Continue to have your blood tested as directed by your health care provider.  What should I know about cancer screening? There are several types of cancer. Take the following steps to reduce your risk and to catch any cancer development as early as possible. Breast Cancer  Practice breast self-awareness. ? This means understanding how your breasts normally appear and feel. ? It also means doing regular breast self-exams. Let your health care provider know about any changes, no matter how small.  If you are 54 or older, have a clinician do a breast exam (clinical breast exam or CBE) every year. Depending on your age, family history, and medical history, it may be recommended that you also have a yearly breast X-ray (mammogram).  If you have a family history of breast cancer, talk with your health care provider about genetic screening.  If you are at high risk for breast cancer, talk with your health care provider about having an MRI and a mammogram every year.  Breast cancer (BRCA) gene test is recommended for women who have family members with BRCA-related cancers. Results of the assessment will determine the need for genetic counseling and BRCA1 and for BRCA2 testing. BRCA-related cancers include these types: ? Breast. This occurs in males or females. ? Ovarian. ? Tubal. This may also be called fallopian tube cancer. ? Cancer of the abdominal or pelvic lining (peritoneal cancer). ? Prostate. ? Pancreatic.  Cervical,  Uterine, and Ovarian Cancer Your health care provider may recommend that you be screened regularly for cancer of the pelvic organs. These include your ovaries, uterus, and vagina. This screening involves a pelvic exam, which includes checking for microscopic changes to the surface of your cervix (Pap test).  For women ages 21-65, health care providers may recommend a pelvic exam and a Pap test every three years. For women ages 25-65, they may recommend the Pap test and pelvic exam, combined with testing for human papilloma virus (HPV), every five years. Some types of HPV increase your risk of cervical cancer. Testing for HPV may also be done on women of any age who have unclear Pap test results.  Other health care providers may not recommend any screening for nonpregnant women who are considered low risk for pelvic cancer and have no symptoms. Ask your health care provider if a screening pelvic exam is right for you.  If you have had past treatment for cervical cancer or a condition that could lead to cancer, you need Pap tests and screening for cancer for at least 20 years after your treatment. If Pap tests have been discontinued for you, your risk factors (such as having a new sexual partner)  need to be reassessed to determine if you should start having screenings again. Some women have medical problems that increase the chance of getting cervical cancer. In these cases, your health care provider may recommend that you have screening and Pap tests more often.  If you have a family history of uterine cancer or ovarian cancer, talk with your health care provider about genetic screening.  If you have vaginal bleeding after reaching menopause, tell your health care provider.  There are currently no reliable tests available to screen for ovarian cancer.  Lung Cancer Lung cancer screening is recommended for adults 62-20 years old who are at high risk for lung cancer because of a history of smoking. A  yearly low-dose CT scan of the lungs is recommended if you:  Currently smoke.  Have a history of at least 30 pack-years of smoking and you currently smoke or have quit within the past 15 years. A pack-year is smoking an average of one pack of cigarettes per day for one year.  Yearly screening should:  Continue until it has been 15 years since you quit.  Stop if you develop a health problem that would prevent you from having lung cancer treatment.  Colorectal Cancer  This type of cancer can be detected and can often be prevented.  Routine colorectal cancer screening usually begins at age 62 and continues through age 45.  If you have risk factors for colon cancer, your health care provider may recommend that you be screened at an earlier age.  If you have a family history of colorectal cancer, talk with your health care provider about genetic screening.  Your health care provider may also recommend using home test kits to check for hidden blood in your stool.  A small camera at the end of a tube can be used to examine your colon directly (sigmoidoscopy or colonoscopy). This is done to check for the earliest forms of colorectal cancer.  Direct examination of the colon should be repeated every 5-10 years until age 5. However, if early forms of precancerous polyps or small growths are found or if you have a family history or genetic risk for colorectal cancer, you may need to be screened more often.  Skin Cancer  Check your skin from head to toe regularly.  Monitor any moles. Be sure to tell your health care provider: ? About any new moles or changes in moles, especially if there is a change in a mole's shape or color. ? If you have a mole that is larger than the size of a pencil eraser.  If any of your family members has a history of skin cancer, especially at a young age, talk with your health care provider about genetic screening.  Always use sunscreen. Apply sunscreen liberally  and repeatedly throughout the day.  Whenever you are outside, protect yourself by wearing long sleeves, pants, a wide-brimmed hat, and sunglasses.  What should I know about osteoporosis? Osteoporosis is a condition in which bone destruction happens more quickly than new bone creation. After menopause, you may be at an increased risk for osteoporosis. To help prevent osteoporosis or the bone fractures that can happen because of osteoporosis, the following is recommended:  If you are 22-37 years old, get at least 1,000 mg of calcium and at least 600 mg of vitamin D per day.  If you are older than age 64 but younger than age 55, get at least 1,200 mg of calcium and at least 600 mg of  vitamin D per day.  If you are older than age 67, get at least 1,200 mg of calcium and at least 800 mg of vitamin D per day.  Smoking and excessive alcohol intake increase the risk of osteoporosis. Eat foods that are rich in calcium and vitamin D, and do weight-bearing exercises several times each week as directed by your health care provider. What should I know about how menopause affects my mental health? Depression may occur at any age, but it is more common as you become older. Common symptoms of depression include:  Low or sad mood.  Changes in sleep patterns.  Changes in appetite or eating patterns.  Feeling an overall lack of motivation or enjoyment of activities that you previously enjoyed.  Frequent crying spells.  Talk with your health care provider if you think that you are experiencing depression. What should I know about immunizations? It is important that you get and maintain your immunizations. These include:  Tetanus, diphtheria, and pertussis (Tdap) booster vaccine.  Influenza every year before the flu season begins.  Pneumonia vaccine.  Shingles vaccine.  Your health care provider may also recommend other immunizations. This information is not intended to replace advice given to you  by your health care provider. Make sure you discuss any questions you have with your health care provider. Document Released: 01/21/2006 Document Revised: 06/18/2016 Document Reviewed: 09/02/2015 Elsevier Interactive Patient Education  2018 [employer].

## 2018-05-25 NOTE — Progress Notes (Signed)
Medical screening examination/treatment was performed by qualified clinical staff member and as supervising physician I was immediately available for consultation/collaboration. I have reviewed documentation and agree with assessment and plan.  Stacey Blyth, MD 

## 2018-05-29 ENCOUNTER — Ambulatory Visit (INDEPENDENT_AMBULATORY_CARE_PROVIDER_SITE_OTHER): Payer: PPO | Admitting: Family Medicine

## 2018-06-06 ENCOUNTER — Encounter: Payer: Self-pay | Admitting: Family Medicine

## 2018-06-06 ENCOUNTER — Ambulatory Visit (INDEPENDENT_AMBULATORY_CARE_PROVIDER_SITE_OTHER): Payer: PPO | Admitting: Family Medicine

## 2018-06-06 VITALS — BP 130/66 | HR 73 | Temp 98.3°F | Resp 16 | Wt 179.8 lb

## 2018-06-06 DIAGNOSIS — H6501 Acute serous otitis media, right ear: Secondary | ICD-10-CM

## 2018-06-06 MED ORDER — AMOXICILLIN-POT CLAVULANATE 875-125 MG PO TABS: 1.0000 | ORAL_TABLET | Freq: Two times a day (BID) | ORAL | 0 refills | Status: DC

## 2018-06-06 MED ORDER — SIMVASTATIN 40 MG PO TABS
ORAL_TABLET | ORAL | 0 refills | Status: DC
Start: 2018-06-06 — End: 2018-08-22

## 2018-06-06 MED ORDER — HYDROCORTISONE-ACETIC ACID 1-2 % OT SOLN: 4.0000 [drp] | Freq: Three times a day (TID) | OTIC | 0 refills | Status: DC

## 2018-06-06 NOTE — Progress Notes (Addendum)
Subjective:  I acted as a Education administrator for [employer]. Yancey Flemings, [place]   Patient ID: Jackie Stevens, female    DOB: 08-22-1946, 72 y.o.   MRN: [id number]  Chief Complaint  Patient presents with  . Conjunctivitis  . Ear Pain    HPI  Patient is in today for pink eye, and right ear pain.  Patient Care Team: Mosie Lukes, MD as PCP - General (Family Medicine) Marylynn Pearson, MD as Consulting Physician (Obstetrics and Gynecology) Marica Otter, [place] as Consulting Physician (Optometry) Reynold Bowen, MD as Consulting Physician (Endocrinology) Gaynelle Arabian, MD as Consulting Physician (Orthopedic Surgery) Meylor, Marlou Sa, [place] as Consulting Physician (Chiropractic Medicine) Sydnee Levans, MD as Consulting Physician (Dermatology)   Past Medical History:  Diagnosis Date  . Abnormal cervical cytology 10/25/2012   Follows with Dr Leavy Cella of Gyn  . Allergic rhinitis   . Anemia 10/06/2013  . Anginal pain ([place])    pt has history of CP states had cardiac workup with no specific issues identified   . Anxiety    when increased stress   . Arthritis    "knees; right thumb; shoulders" (12-25-12)  . Asthma   . Chest pain   . Chicken pox as a child  . Complication of anesthesia   . Constipation   . Decreased hearing   . Dermatitis 03/06/2017  . Diverticulitis 10/25/2012   pt. reports that a drain was placed - 09/2012    . Dry mouth   . Excessive thirst   . External hemorrhoid, bleeding    "sometimes" (Dec 25, 2012)  . Frequent urination   . GERD (gastroesophageal reflux disease)   . H/O hiatal hernia   . Hand tingling   . Heart murmur   . HTN (hypertension)    stress test completed by Anselm Lis, diagnosed as GERD  . Hyperlipidemia   . Hypothyroidism   . Incontinence   . Infertility, female   . Insomnia   . Joint pain   . Kidney stones 1970's   "passed on their own" (Dec 25, 2012)  . Knee pain   . Left shoulder pain 09/14/2016  . Low back pain 06/09/2014  . Measles as a  child  . Medicare annual wellness visit, subsequent 10/06/2013   Steinhoffer of Dermatology Pneumovax in 2012   . Memory difficulties 09/05/2017  . Obesity 09/11/2017  . OSA on CPAP   . Palpitations   . Paresthesia 03/23/2015   Left face  . PONV (postoperative nausea and vomiting)   . Preventative health care 10/06/2013   Steinhoffer of Dermatology Pneumovax in 2012  . Preventative health care 02/26/2016  . Rheumatoid arteritis   . Shortness of breath dyspnea    using stairs  . Sinus pain   . Sleep apnea   . Swelling of both lower extremities   . Thyroid cancer ([place]) 1980's  . Thyroid disease   . Tinnitus   . UTI (urinary tract infection) 04/02/2013    Past Surgical History:  Procedure Laterality Date  . CHOLECYSTECTOMY  1990  . COLON SURGERY    . COLOSTOMY REVISION  12-25-12   Procedure: COLON RESECTION SIGMOID;  Surgeon: Gwenyth Ober, MD;  Location: [facility];  Service: General;  Laterality: N/A;  . CYSTOSCOPY WITH STENT PLACEMENT  12/25/2012   Procedure: CYSTOSCOPY WITH STENT PLACEMENT;  Surgeon: Hanley Ben, MD;  Location: [facility];  Service: Urology;  Laterality: N/A;  . DILATION AND CURETTAGE OF UTERUS  1960's   "lots of them; had miscarriages" (2012-12-25)  .  LYSIS OF ADHESION N/A 12/02/2015   Procedure: LAPAROSCOPIC LYSIS OF ADHESION;  Surgeon: Johnathan Hausen, MD;  Location: [facility];  Service: General;  Laterality: N/A;  . ROBOTIC ASSISTED BILATERAL SALPINGO OOPHERECTOMY Bilateral 12/02/2015   Procedure: XI ROBOTIC ASSISTED BILATERAL SALPINGO OOPHORECTOMY;  Surgeon: Everitt Amber, MD;  Location: [facility];  Service: Gynecology;  Laterality: Bilateral;  . SIGMOID RESECTION / RECTOPEXY  12/19/2012  . THYROIDECTOMY, PARTIAL  1988   "then did iodine to remove the rest" (12/19/2012)  . TONSILLECTOMY  1951?  . TRANSRECTAL DRAINAGE OF PELVIC ABSCESS  10/27/2012  . VAGINAL HYSTERECTOMY  1970's   "still have my ovaries" (12/19/2012)    Family History  Problem Relation Age of Onset  . Heart  disease Father   . Pneumonia Father   . Hypertension Father   . Hyperlipidemia Father   . Cancer Father        skin  . Stroke Father   . Alzheimer's disease Mother   . Heart disease Mother   . Depression Mother   . Emphysema Brother        marijuana and cigarettes  . Alcohol abuse Brother   . Hearing loss Brother   . Diabetes Maternal Grandmother   . Alzheimer's disease Paternal Grandmother   . Cancer Paternal Grandmother        lung?- smoker  . Hyperlipidemia Paternal Grandmother   . Heart attack Paternal Grandfather   . Alcohol abuse Paternal Grandfather   . Neurofibromatosis Son        schwanomatosis  . Neurofibromatosis Son        swanomatosis  . Cancer Paternal Aunt     Social History   Socioeconomic History  . Marital status: Married    Spouse name: [name]  . Number of children: 2  . Years of education: Not on file  . Highest education level: Not on file  Occupational History  . Occupation: Retired  Scientific laboratory technician  . Financial resource strain: Not on file  . Food insecurity:    Worry: Not on file    Inability: Not on file  . Transportation needs:    Medical: Not on file    Non-medical: Not on file  Tobacco Use  . Smoking status: Never Smoker  . Smokeless tobacco: Never Used  Substance and Sexual Activity  . Alcohol use: No    Alcohol/week: 0.0 oz  . Drug use: No  . Sexual activity: Not Currently  Lifestyle  . Physical activity:    Days per week: Not on file    Minutes per session: Not on file  . Stress: Not on file  Relationships  . Social connections:    Talks on phone: Not on file    Gets together: Not on file    Attends religious service: Not on file    Active member of club or organization: Not on file    Attends meetings of clubs or organizations: Not on file    Relationship status: Not on file  . Intimate partner violence:    Fear of current or ex partner: Not on file    Emotionally abused: Not on file    Physically abused: Not on file     Forced sexual activity: Not on file  Other Topics Concern  . Not on file  Social History Narrative   Married    children    Outpatient Medications Prior to Visit  Medication Sig Dispense Refill  . amLODipine (NORVASC) 2.5 MG tablet Take 1 tablet (2.5  mg total) by mouth 2 (two) times daily. 180 tablet 1  . docusate sodium (COLACE) 100 MG capsule Take 100 mg by mouth 2 (two) times daily. Reported on 03/30/2016    . fluticasone (FLONASE) 50 MCG/ACT nasal spray Place 2 sprays into both nostrils daily. 48 g 3  . gabapentin (NEURONTIN) 100 MG capsule TAKE 2 CAPSULES BY MOUTH AT BEDTIME 180 capsule 0  . hydrochlorothiazide (HYDRODIURIL) 25 MG tablet Take 1 tablet (25 mg total) by mouth daily. 90 tablet 1  . KETOCONAZOLE, TOPICAL, 1 % SHAM Apply 1 Dose topically once a week. 200 mL 1  . KRILL OIL PO Take 750 mg by mouth daily.     Marland Kitchen levothyroxine (SYNTHROID, LEVOTHROID) 125 MCG tablet Take 1 tablet (125 mcg total) by mouth daily. 90 tablet 0  . losartan (COZAAR) 100 MG tablet TAKE 1 TABLET BY MOUTH EVERY DAY 90 tablet 1  . Multiple Vitamins-Minerals (MULTIVITAMIN WOMEN 50+ PO) Take 1 tablet by mouth daily.    . naproxen (NAPROSYN) 375 MG tablet Take 1 tablet (375 mg total) by mouth 2 (two) times daily with a meal. 180 tablet 1  . NITROSTAT 0.4 MG SL tablet PLACE 1 TABLET UNDER THE TONGUE EVERY 5 MINUTES AS NEEDED FOR CHEST PAIN 25 tablet 0  . omeprazole (PRILOSEC) 40 MG capsule Take 1 capsule (40 mg total) by mouth 2 (two) times daily. 180 capsule 1  . Polyethyl Glycol-Propyl Glycol 0.4-0.3 % GEL Apply 1 drop to eye 2 (two) times daily as needed (dry eyes).     . polyethylene glycol (MIRALAX / GLYCOLAX) packet Take 17 g by mouth daily.    . Probiotic Product (PROBIOTIC ADVANCED PO) Take 2 tablets by mouth at bedtime. Digestive Advantage    . psyllium (REGULOID) 0.52 G capsule Take 0.52 g by mouth at bedtime.    . ranitidine (ZANTAC) 300 MG tablet TAKE 1 TABLET BY MOUTH EVERYDAY AT BEDTIME 90  tablet 1  . sodium chloride (OCEAN) 0.65 % SOLN nasal spray Place 1 spray into both nostrils 2 (two) times daily as needed for congestion.     . TURMERIC PO Take 1 tablet by mouth daily. Reported on 12/19/2015    . vitamin B-12 (CYANOCOBALAMIN) 1000 MCG tablet Take 100 mcg by mouth every other day.    . Vitamin D, Cholecalciferol, 1000 UNITS CAPS Take 1 capsule by mouth daily.    . simvastatin (ZOCOR) 40 MG tablet TAKE 1 TABLET BY MOUTH EVERYDAY AT BEDTIME 90 tablet 0   No facility-administered medications prior to visit.     Allergies  Allergen Reactions  . Neomycin-Bacitracin Zn-Polymyx Rash    Polysporin- is tolerated   . Niacin Other (See Comments) and Cough    "cough til I threw up" (12/19/2012)  . Ciprofloxacin Hives    Got cipro and flagyl at same time, localized hives to IV arm  . Flagyl [Metronidazole] Hives    Got cipro and flagyl at same time, localized hives to IV arm    Review of Systems  Constitutional: Negative for chills, fever and malaise/fatigue.  HENT: Positive for ear pain. Negative for congestion and hearing loss.   Eyes: Positive for discharge and redness.  Respiratory: Negative for cough, sputum production and shortness of breath.   Cardiovascular: Negative for chest pain, palpitations and leg swelling.  Gastrointestinal: Negative for abdominal pain, blood in stool, constipation, diarrhea, heartburn, nausea and vomiting.  Genitourinary: Negative for dysuria, frequency, hematuria and urgency.  Musculoskeletal: Negative for back pain, falls  and myalgias.  Skin: Negative for rash.  Neurological: Negative for dizziness, sensory change, loss of consciousness, weakness and headaches.  Endo/Heme/Allergies: Negative for environmental allergies. Does not bruise/bleed easily.  Psychiatric/Behavioral: Negative for depression and suicidal ideas. The patient is not nervous/anxious and does not have insomnia.        Objective:    Physical Exam  Constitutional: She is  oriented to person, place, and time. She appears well-developed and well-nourished.  HENT:  Head: Normocephalic and atraumatic.  Right Ear: There is tenderness. Tympanic membrane is injected. A middle ear effusion is present.  Left Ear: Hearing, tympanic membrane, external ear and ear canal normal.  Eyes: Conjunctivae, EOM and lids are normal.  Neck: Normal range of motion. Neck supple. No JVD present. Carotid bruit is not present. No thyromegaly present.  Cardiovascular: Normal rate, regular rhythm and normal heart sounds.  No murmur heard. Pulmonary/Chest: Effort normal and breath sounds normal. No respiratory distress. She has no wheezes. She has no rales. She exhibits no tenderness.  Musculoskeletal: She exhibits no edema.  Neurological: She is alert and oriented to person, place, and time.  Psychiatric: She has a normal mood and affect.  Nursing note and vitals reviewed.   BP 130/66 (BP Location: Left Arm, Patient Position: Sitting, Cuff Size: Normal)   Pulse 73   Temp 98.3 F (36.8 C) (Oral)   Resp 16   Wt 179 lb 12.8 oz (81.6 kg)   SpO2 96%   BMI 32.89 kg/m  Wt Readings from Last 3 Encounters:  06/06/18 179 lb 12.8 oz (81.6 kg)  05/25/18 181 lb 12.8 oz (82.5 kg)  05/03/18 181 lb (82.1 kg)   BP Readings from Last 3 Encounters:  06/06/18 130/66  05/25/18 (!) 141/70  05/03/18 125/66     Immunization History  Administered Date(s) Administered  . Influenza Split 09/12/2012, 10/02/2015  . Influenza, High Dose Seasonal PF 09/14/2016, 09/05/2017  . Influenza,inj,Quad PF,6+ Mos 08/30/2013, 10/21/2014, 08/20/2015  . Pneumococcal Conjugate-13 07/19/2011, 11/01/2014  . Pneumococcal Polysaccharide-23 03/30/2016  . Tdap 10/02/2013  . Zoster 12/09/2008    Health Maintenance  Topic Date Due  . Hepatitis C Screening  1946/03/25  . INFLUENZA VACCINE  07/13/2018  . MAMMOGRAM  08/31/2019  . TETANUS/TDAP  10/03/2023  . COLONOSCOPY  12/21/2023  . DEXA SCAN  Completed  . PNA  vac Low Risk Adult  Completed    Lab Results  Component Value Date   WBC 6.4 02/24/2018   HGB 12.3 02/24/2018   HCT 36.5 02/24/2018   PLT 281.0 02/24/2018   GLUCOSE 102 (H) 02/24/2018   CHOL 141 12/14/2017   TRIG 107.0 12/14/2017   HDL 46.60 12/14/2017   LDLCALC 73 12/14/2017   ALT 19 02/24/2018   AST 17 02/24/2018   NA 137 02/24/2018   K 4.0 02/24/2018   CL 99 02/24/2018   CREATININE 0.69 02/24/2018   BUN 16 02/24/2018   CO2 32 02/24/2018   TSH 0.49 02/24/2018   INR 0.99 03/16/2015   HGBA1C 5.8 (H) 07/18/2011    Lab Results  Component Value Date   TSH 0.49 02/24/2018   Lab Results  Component Value Date   WBC 6.4 02/24/2018   HGB 12.3 02/24/2018   HCT 36.5 02/24/2018   MCV 87.1 02/24/2018   PLT 281.0 02/24/2018   Lab Results  Component Value Date   NA 137 02/24/2018   K 4.0 02/24/2018   CO2 32 02/24/2018   GLUCOSE 102 (H) 02/24/2018   BUN 16 02/24/2018  CREATININE 0.69 02/24/2018   BILITOT 0.3 02/24/2018   ALKPHOS 76 02/24/2018   AST 17 02/24/2018   ALT 19 02/24/2018   PROT 6.4 02/24/2018   ALBUMIN 3.9 02/24/2018   CALCIUM 8.8 02/24/2018   ANIONGAP 6 12/03/2015   GFR 88.51 02/24/2018   Lab Results  Component Value Date   CHOL 141 12/14/2017   Lab Results  Component Value Date   HDL 46.60 12/14/2017   Lab Results  Component Value Date   LDLCALC 73 12/14/2017   Lab Results  Component Value Date   TRIG 107.0 12/14/2017   Lab Results  Component Value Date   CHOLHDL 3 12/14/2017   Lab Results  Component Value Date   HGBA1C 5.8 (H) 07/18/2011         Assessment & Plan:   Problem List Items Addressed This Visit    None    Visit Diagnoses    Non-recurrent acute serous otitis media of right ear    -  Primary   Relevant Medications   amoxicillin-clavulanate (AUGMENTIN) 875-125 MG tablet   acetic acid-hydrocortisone (VOSOL-HC) OTIC solution      I am having Jackie Stevens start on amoxicillin-clavulanate and acetic  acid-hydrocortisone. I am also having her maintain her psyllium, KRILL OIL PO, TURMERIC PO, Polyethyl Glycol-Propyl Glycol, sodium chloride, Probiotic Product (PROBIOTIC ADVANCED PO), Vitamin D (Cholecalciferol), docusate sodium, NITROSTAT, polyethylene glycol, fluticasone, omeprazole, ranitidine, naproxen, hydrochlorothiazide, amLODipine, levothyroxine, losartan, Multiple Vitamins-Minerals (MULTIVITAMIN WOMEN 50+ PO), vitamin B-12, KETOCONAZOLE (TOPICAL), gabapentin, and simvastatin.  Meds ordered this encounter  Medications  . simvastatin (ZOCOR) 40 MG tablet    Sig: TAKE 1 TABLET BY MOUTH EVERYDAY AT BEDTIME    Dispense:  90 tablet    Refill:  0  . amoxicillin-clavulanate (AUGMENTIN) 875-125 MG tablet    Sig: Take 1 tablet by mouth 2 (two) times daily.    Dispense:  20 tablet    Refill:  0  . acetic acid-hydrocortisone (VOSOL-HC) OTIC solution    Sig: Place 4 drops into the right ear 3 (three) times daily.    Dispense:  10 mL    Refill:  0    CMA served as scribe during this visit. History, Physical and Plan performed by medical provider. Documentation and orders reviewed and attested to.  Ann Held, DO

## 2018-06-06 NOTE — Patient Instructions (Signed)
Otitis Media, Adult Otitis media is redness, soreness, and puffiness (swelling) in the space just behind your eardrum (middle ear). It may be caused by allergies or infection. It often happens along with a cold. Follow these instructions at home:  Take your medicine as told. Finish it even if you start to feel better.  Only take over-the-counter or prescription medicines for pain, discomfort, or fever as told by your doctor.  Follow up with your doctor as told. Contact a doctor if:  You have otitis media only in one ear, or bleeding from your nose, or both.  You notice a lump on your neck.  You are not getting better in 3-5 days.  You feel worse instead of better. Get help right away if:  You have pain that is not helped with medicine.  You have puffiness, redness, or pain around your ear.  You get a stiff neck.  You cannot move part of your face (paralysis).  You notice that the bone behind your ear hurts when you touch it. This information is not intended to replace advice given to you by your health care provider. Make sure you discuss any questions you have with your health care provider. Document Released: 05/17/2008 Document Revised: 05/06/2016 Document Reviewed: 06/26/2013 Elsevier Interactive Patient Education  2017 [employer]  

## 2018-06-08 ENCOUNTER — Ambulatory Visit (INDEPENDENT_AMBULATORY_CARE_PROVIDER_SITE_OTHER): Payer: PPO | Admitting: Family Medicine

## 2018-06-08 VITALS — BP 99/59 | HR 72 | Temp 98.0°F | Ht 62.0 in | Wt 174.0 lb

## 2018-06-08 DIAGNOSIS — R7303 Prediabetes: Secondary | ICD-10-CM | POA: Diagnosis not present

## 2018-06-08 DIAGNOSIS — I10 Essential (primary) hypertension: Secondary | ICD-10-CM | POA: Diagnosis not present

## 2018-06-08 DIAGNOSIS — E669 Obesity, unspecified: Secondary | ICD-10-CM

## 2018-06-08 DIAGNOSIS — Z6831 Body mass index (BMI) 31.0-31.9, adult: Secondary | ICD-10-CM | POA: Diagnosis not present

## 2018-06-08 NOTE — Progress Notes (Signed)
Office: [phone]  /  Fax: [phone]   HPI:   Chief Complaint: OBESITY Jackie Stevens is here to discuss her progress with her obesity treatment plan. She is on the Category 2 plan and is following her eating plan approximately 80 % of the time. She states she is doing senior exercises at the [facility] for 15 to 20 minutes 1 time per week. Jackie Stevens indulged a bit with sweets and is so surprised with weight loss. She is looking for various ways to change chicken recipes. Her weight is 174 lb (78.9 kg) today and has had a weight loss of 7 pounds over a period of 5 weeks since her last visit. She has lost 30 lbs since starting treatment with [place].  Pre-Diabetes Jackie Stevens has a diagnosis of prediabetes based on her elevated Hgb A1c and was informed this puts her at greater risk of developing diabetes. She is not taking metformin currently and continues to work on diet and exercise to decrease risk of diabetes. She admits carb cravings and denies nausea or hypoglycemia.  Hypertension Jackie Stevens is a 72 y.o. female with hypertension. Her blood Stevens is borderline low today. She doesn't use MyChart. Jackie Stevens admits dizziness. She is working weight loss to help control her blood Stevens with the goal of decreasing her risk of heart attack and stroke. Jackie Stevens is not currently controlled.  ALLERGIES: Allergies  Allergen Reactions  . Neomycin-Bacitracin Zn-Polymyx Rash    Polysporin- is tolerated   . Niacin Other (See Comments) and Cough    "cough til I threw up" (12/19/2012)  . Ciprofloxacin Hives    Got cipro and flagyl at same time, localized hives to IV arm  . Flagyl [Metronidazole] Hives    Got cipro and flagyl at same time, localized hives to IV arm    MEDICATIONS: Current Outpatient Medications on File Prior to Visit  Medication Sig Dispense Refill  . acetic acid-hydrocortisone (VOSOL-HC) OTIC solution Place 4 drops into the right ear 3 (three) times daily. 10 mL 0  .  amLODipine (NORVASC) 2.5 MG tablet Take 1 tablet (2.5 mg total) by mouth 2 (two) times daily. 180 tablet 1  . amoxicillin-clavulanate (AUGMENTIN) 875-125 MG tablet Take 1 tablet by mouth 2 (two) times daily. 20 tablet 0  . docusate sodium (COLACE) 100 MG capsule Take 100 mg by mouth 2 (two) times daily. Reported on 03/30/2016    . fluticasone (FLONASE) 50 MCG/ACT nasal spray Place 2 sprays into both nostrils daily. 48 g 3  . gabapentin (NEURONTIN) 100 MG capsule TAKE 2 CAPSULES BY MOUTH AT BEDTIME 180 capsule 0  . hydrochlorothiazide (HYDRODIURIL) 25 MG tablet Take 1 tablet (25 mg total) by mouth daily. 90 tablet 1  . KETOCONAZOLE, TOPICAL, 1 % SHAM Apply 1 Dose topically once a week. 200 mL 1  . KRILL OIL PO Take 750 mg by mouth daily.     Marland Kitchen levothyroxine (SYNTHROID, LEVOTHROID) 125 MCG tablet Take 1 tablet (125 mcg total) by mouth daily. 90 tablet 0  . losartan (COZAAR) 100 MG tablet TAKE 1 TABLET BY MOUTH EVERY DAY 90 tablet 1  . Multiple Vitamins-Minerals (MULTIVITAMIN WOMEN 50+ PO) Take 1 tablet by mouth daily.    . naproxen (NAPROSYN) 375 MG tablet Take 1 tablet (375 mg total) by mouth 2 (two) times daily with a meal. 180 tablet 1  . NITROSTAT 0.4 MG SL tablet PLACE 1 TABLET UNDER THE TONGUE EVERY 5 MINUTES AS NEEDED FOR CHEST PAIN 25 tablet  0  . omeprazole (PRILOSEC) 40 MG capsule Take 1 capsule (40 mg total) by mouth 2 (two) times daily. 180 capsule 1  . Polyethyl Glycol-Propyl Glycol 0.4-0.3 % GEL Apply 1 drop to eye 2 (two) times daily as needed (dry eyes).     . polyethylene glycol (MIRALAX / GLYCOLAX) packet Take 17 g by mouth daily.    . Probiotic Product (PROBIOTIC ADVANCED PO) Take 2 tablets by mouth at bedtime. Digestive Advantage    . psyllium (REGULOID) 0.52 G capsule Take 0.52 g by mouth at bedtime.    . ranitidine (ZANTAC) 300 MG tablet TAKE 1 TABLET BY MOUTH EVERYDAY AT BEDTIME 90 tablet 1  . simvastatin (ZOCOR) 40 MG tablet TAKE 1 TABLET BY MOUTH EVERYDAY AT BEDTIME 90 tablet  0  . sodium chloride (OCEAN) 0.65 % SOLN nasal spray Place 1 spray into both nostrils 2 (two) times daily as needed for congestion.     . TURMERIC PO Take 1 tablet by mouth daily. Reported on 12/19/2015    . vitamin B-12 (CYANOCOBALAMIN) 1000 MCG tablet Take 100 mcg by mouth every other day.    . Vitamin D, Cholecalciferol, 1000 UNITS CAPS Take 1 capsule by mouth daily.     No current facility-administered medications on file prior to visit.     PAST MEDICAL HISTORY: Past Medical History:  Diagnosis Date  . Abnormal cervical cytology 10/25/2012   Follows with Dr Leavy Cella of Gyn  . Allergic rhinitis   . Anemia 10/06/2013  . Anginal pain ([place])    pt has history of CP states had cardiac workup with no specific issues identified   . Anxiety    when increased stress   . Arthritis    "knees; right thumb; shoulders" (12-26-2012)  . Asthma   . Chest pain   . Chicken pox as a child  . Complication of anesthesia   . Constipation   . Decreased hearing   . Dermatitis 03/06/2017  . Diverticulitis 10/25/2012   pt. reports that a drain was placed - 09/2012    . Dry mouth   . Excessive thirst   . External hemorrhoid, bleeding    "sometimes" (Dec 26, 2012)  . Frequent urination   . GERD (gastroesophageal reflux disease)   . H/O hiatal hernia   . Hand tingling   . Heart murmur   . HTN (hypertension)    stress test completed by Anselm Lis, diagnosed as GERD  . Hyperlipidemia   . Hypothyroidism   . Incontinence   . Infertility, female   . Insomnia   . Joint pain   . Kidney stones 1970's   "passed on their own" (12/26/2012)  . Knee pain   . Left shoulder pain 09/14/2016  . Low back pain 06/09/2014  . Measles as a child  . Medicare annual wellness visit, subsequent 10/06/2013   Steinhoffer of Dermatology Pneumovax in 2012   . Memory difficulties 09/05/2017  . Obesity 09/11/2017  . OSA on CPAP   . Palpitations   . Paresthesia 03/23/2015   Left face  . PONV (postoperative nausea and  vomiting)   . Preventative health care 10/06/2013   Steinhoffer of Dermatology Pneumovax in 2012  . Preventative health care 02/26/2016  . Rheumatoid arteritis   . Shortness of breath dyspnea    using stairs  . Sinus pain   . Sleep apnea   . Swelling of both lower extremities   . Thyroid cancer ([place]) 1980's  . Thyroid disease   . Tinnitus   .  UTI (urinary tract infection) 04/02/2013    PAST SURGICAL HISTORY: Past Surgical History:  Procedure Laterality Date  . CHOLECYSTECTOMY  1990  . COLON SURGERY    . COLOSTOMY REVISION  12/19/2012   Procedure: COLON RESECTION SIGMOID;  Surgeon: Gwenyth Ober, MD;  Location: [facility];  Service: General;  Laterality: N/A;  . CYSTOSCOPY WITH STENT PLACEMENT  12/19/2012   Procedure: CYSTOSCOPY WITH STENT PLACEMENT;  Surgeon: Hanley Ben, MD;  Location: [facility];  Service: Urology;  Laterality: N/A;  . DILATION AND CURETTAGE OF UTERUS  1960's   "lots of them; had miscarriages" (12/19/2012)  . LYSIS OF ADHESION N/A 12/02/2015   Procedure: LAPAROSCOPIC LYSIS OF ADHESION;  Surgeon: Johnathan Hausen, MD;  Location: [facility];  Service: General;  Laterality: N/A;  . ROBOTIC ASSISTED BILATERAL SALPINGO OOPHERECTOMY Bilateral 12/02/2015   Procedure: XI ROBOTIC ASSISTED BILATERAL SALPINGO OOPHORECTOMY;  Surgeon: Everitt Amber, MD;  Location: [facility];  Service: Gynecology;  Laterality: Bilateral;  . SIGMOID RESECTION / RECTOPEXY  12/19/2012  . THYROIDECTOMY, PARTIAL  1988   "then did iodine to remove the rest" (12/19/2012)  . TONSILLECTOMY  1951?  . TRANSRECTAL DRAINAGE OF PELVIC ABSCESS  10/27/2012  . VAGINAL HYSTERECTOMY  1970's   "still have my ovaries" (12/19/2012)    SOCIAL HISTORY: Social History   Tobacco Use  . Smoking status: Never Smoker  . Smokeless tobacco: Never Used  Substance Use Topics  . Alcohol use: No    Alcohol/week: 0.0 oz  . Drug use: No    FAMILY HISTORY: Family History  Problem Relation Age of Onset  . Heart disease Father   . Pneumonia  Father   . Hypertension Father   . Hyperlipidemia Father   . Cancer Father        skin  . Stroke Father   . Alzheimer's disease Mother   . Heart disease Mother   . Depression Mother   . Emphysema Brother        marijuana and cigarettes  . Alcohol abuse Brother   . Hearing loss Brother   . Diabetes Maternal Grandmother   . Alzheimer's disease Paternal Grandmother   . Cancer Paternal Grandmother        lung?- smoker  . Hyperlipidemia Paternal Grandmother   . Heart attack Paternal Grandfather   . Alcohol abuse Paternal Grandfather   . Neurofibromatosis Son        schwanomatosis  . Neurofibromatosis Son        swanomatosis  . Cancer Paternal Aunt     ROS: Review of Systems  Constitutional: Positive for weight loss.  Gastrointestinal: Negative for nausea.  Neurological: Positive for dizziness.  Endo/Heme/Allergies:       Positive for carb cravings Negative for hypoglycemia    PHYSICAL EXAM: Blood Stevens (!) 99/59, pulse 72, temperature 98 F (36.7 C), height 5\' 2"  (1.575 m), weight 174 lb (78.9 kg), SpO2 98 %. Body mass index is 31.83 kg/m. Physical Exam  Constitutional: She is oriented to person, place, and time. She appears well-developed and well-nourished.  Cardiovascular: Normal rate.  Pulmonary/Chest: Effort normal.  Musculoskeletal: Normal range of motion.  Neurological: She is oriented to person, place, and time.  Skin: Skin is warm and dry.  Psychiatric: She has a normal mood and affect. Her behavior is normal.  Vitals reviewed.   RECENT LABS AND TESTS: BMET    Component Value Date/Time   NA 137 02/24/2018 0829   K 4.0 02/24/2018 0829   CL 99 02/24/2018 0829  CO2 32 02/24/2018 0829   GLUCOSE 102 (H) 02/24/2018 0829   BUN 16 02/24/2018 0829   CREATININE 0.69 02/24/2018 0829   CREATININE 0.75 05/28/2014 0836   CALCIUM 8.8 02/24/2018 0829   GFRNONAA >60 12/03/2015 0522   GFRAA >60 12/03/2015 0522   Lab Results  Component Value Date   HGBA1C  5.8 (H) 07/18/2011   Lab Results  Component Value Date   INSULIN 7.9 02/28/2018   CBC    Component Value Date/Time   WBC 6.4 02/24/2018 0829   RBC 4.19 02/24/2018 0829   HGB 12.3 02/24/2018 0829   HCT 36.5 02/24/2018 0829   PLT 281.0 02/24/2018 0829   MCV 87.1 02/24/2018 0829   MCH 28.7 12/03/2015 0522   MCHC 33.6 02/24/2018 0829   RDW 13.7 02/24/2018 0829   LYMPHSABS 1.5 12/14/2017 0935   MONOABS 0.5 12/14/2017 0935   EOSABS 0.4 12/14/2017 0935   BASOSABS 0.0 12/14/2017 0935   Iron/TIBC/Ferritin/ %Sat    Component Value Date/Time   IRON 33 09/15/2015 0618   TIBC 249 (L) 09/15/2015 0618   FERRITIN 52 09/15/2015 0618   IRONPCTSAT 13 09/15/2015 0618   IRONPCTSAT 22 07/02/2013 1056   Lipid Panel     Component Value Date/Time   CHOL 141 12/14/2017 0935   TRIG 107.0 12/14/2017 0935   HDL 46.60 12/14/2017 0935   CHOLHDL 3 12/14/2017 0935   VLDL 21.4 12/14/2017 0935   LDLCALC 73 12/14/2017 0935   Hepatic Function Panel     Component Value Date/Time   PROT 6.4 02/24/2018 0829   ALBUMIN 3.9 02/24/2018 0829   AST 17 02/24/2018 0829   ALT 19 02/24/2018 0829   ALKPHOS 76 02/24/2018 0829   BILITOT 0.3 02/24/2018 0829   BILIDIR 0.0 11/01/2014 1116   IBILI 0.3 05/28/2014 0836      Component Value Date/Time   TSH 0.49 02/24/2018 0829   TSH 0.40 12/14/2017 0935   TSH 0.18 (L) 09/05/2017 1509   Results for ALIYANA, DLUGOSZ (MRN [id number]) as of 06/08/2018 15:46  Ref. Range 02/28/2018 10:10  Vitamin D, 25-Hydroxy Latest Ref Range: 30.0 - 100.0 ng/mL 47.4   ASSESSMENT AND PLAN: Prediabetes  Essential hypertension  Class 1 obesity with serious comorbidity and body mass index (BMI) of 31.0 to 31.9 in adult, unspecified obesity type  PLAN:  Pre-Diabetes Digna will continue to work on weight loss, exercise, and decreasing simple carbohydrates in her diet to help decrease the risk of diabetes. She was informed that eating too many simple carbohydrates or too many  calories at one sitting increases the likelihood of GI side effects. Tonnia will continue the category 2 plan and she agreed to follow up with [place] as directed to monitor her progress.  Hypertension Hebe is to call if dizziness or if lightheaded, so we can change her blood Stevens medications. Jamy agreed with this plan and agreed to follow up as directed. We will continue to monitor her blood Stevens as well as her progress with the above lifestyle modifications. She will continue her medications as prescribed and will watch for signs of hypotension as she continues her lifestyle modifications.  We spent > than 50% of the 15 minute visit on the counseling as documented in the note.  Obesity Eriyana is currently in the action stage of change. As such, her goal is to continue with weight loss efforts She has agreed to follow the Category 2 plan Benjamin has been instructed to work up to a goal of 150  minutes of combined cardio and strengthening exercise per week for weight loss and overall health benefits. We discussed the following Behavioral Modification Strategies today: planning for success, increasing lean protein intake, increasing vegetables and work on meal planning and easy cooking plans  We showed [employer] today.  Kaeli has agreed to follow up with our clinic in 2 weeks. She was informed of the importance of frequent follow up visits to maximize her success with intensive lifestyle modifications for her multiple health conditions.   OBESITY BEHAVIORAL INTERVENTION VISIT  Today's visit was # 6 out of 22.  Starting weight: 204 lbs Starting date: 02/28/18 Today's weight : 174 lbs Today's date: 06/08/2018 Total lbs lost to date: 30 (Patients must lose 7 lbs in the first 6 months to continue with counseling)   ASK: We discussed the diagnosis of obesity with [name] today and Sorrel agreed to give [place] permission to discuss obesity behavioral  modification therapy today.  ASSESS: Kensleigh has the diagnosis of obesity and her BMI today is 31.82 Vernisha is in the action stage of change   ADVISE: Maycee was educated on the multiple health risks of obesity as well as the benefit of weight loss to improve her health. She was advised of the need for long term treatment and the importance of lifestyle modifications.  AGREE: Multiple dietary modification options and treatment options were discussed and  [place]a agreed to the above obesity treatment plan.  I, Doreene Nest, am acting as transcriptionist for Eber Jones, MD  I have reviewed the above documentation for accuracy and completeness, and I agree with the above. - Ilene Qua, MD

## 2018-06-13 ENCOUNTER — Other Ambulatory Visit: Payer: Self-pay | Admitting: Family Medicine

## 2018-06-13 DIAGNOSIS — K219 Gastro-esophageal reflux disease without esophagitis: Secondary | ICD-10-CM

## 2018-06-13 DIAGNOSIS — R059 Cough, unspecified: Secondary | ICD-10-CM

## 2018-06-13 DIAGNOSIS — R05 Cough: Secondary | ICD-10-CM

## 2018-06-13 DIAGNOSIS — J302 Other seasonal allergic rhinitis: Secondary | ICD-10-CM

## 2018-06-16 DIAGNOSIS — G4733 Obstructive sleep apnea (adult) (pediatric): Secondary | ICD-10-CM | POA: Diagnosis not present

## 2018-06-20 ENCOUNTER — Telehealth: Payer: Self-pay | Admitting: Family Medicine

## 2018-06-20 NOTE — Telephone Encounter (Signed)
She is in today with her husband for his appt and notes as she has lost weight she has had episodes of light headedness and her blood pressure has run low. So she is advised to drop the Amlodipine 2.5 mg just daily

## 2018-06-24 ENCOUNTER — Other Ambulatory Visit: Payer: Self-pay | Admitting: Family Medicine

## 2018-06-27 DIAGNOSIS — M25561 Pain in right knee: Secondary | ICD-10-CM | POA: Diagnosis not present

## 2018-06-27 DIAGNOSIS — M25562 Pain in left knee: Secondary | ICD-10-CM | POA: Diagnosis not present

## 2018-07-04 ENCOUNTER — Ambulatory Visit (INDEPENDENT_AMBULATORY_CARE_PROVIDER_SITE_OTHER): Payer: PPO | Admitting: Family Medicine

## 2018-07-04 VITALS — BP 127/72 | HR 77 | Temp 97.9°F | Ht 62.0 in | Wt 171.0 lb

## 2018-07-04 DIAGNOSIS — E669 Obesity, unspecified: Secondary | ICD-10-CM

## 2018-07-04 DIAGNOSIS — R7303 Prediabetes: Secondary | ICD-10-CM

## 2018-07-04 DIAGNOSIS — Z6831 Body mass index (BMI) 31.0-31.9, adult: Secondary | ICD-10-CM

## 2018-07-04 DIAGNOSIS — I10 Essential (primary) hypertension: Secondary | ICD-10-CM | POA: Diagnosis not present

## 2018-07-04 DIAGNOSIS — E038 Other specified hypothyroidism: Secondary | ICD-10-CM | POA: Diagnosis not present

## 2018-07-04 NOTE — Progress Notes (Signed)
Office: [phone]  /  Fax: [phone]   HPI:   Chief Complaint: OBESITY Jackie Stevens is here to discuss her progress with her obesity treatment plan. She is on the Category 2 plan and is following her eating plan approximately 30 % of the time. She states she is walking 30 minutes 2 times per week. Jackie Stevens indulged while in [place] on vacation. She has gotten back on track for the past three days. Her husband is a very picky eater and so she struggled to follow the plan secondary to cooking for her husband's interest. Her weight is 171 lb (77.6 kg) today and has had a weight loss of 3 pounds over a period of 3 weeks since her last visit. She has lost 33 lbs since starting treatment with [place].  Pre-Diabetes Jackie Stevens has a diagnosis of prediabetes based on her elevated Hgb A1c and was informed this puts her at greater risk of developing diabetes. She is not taking metformin currently and continues to work on diet and exercise to decrease risk of diabetes. She admits carb cravings and denies nausea or hypoglycemia.  Hypertension Jackie Stevens is a 72 y.o. female with hypertension. Jackie Stevens denies chest pain, chest pressure or headache.  She is working weight loss to help control her blood pressure with the goal of decreasing her risk of heart attack and stroke. Jackie Stevens blood pressure is controlled today.  Hypothyroidism Jackie Stevens has a diagnosis of hypothyroidism. She is on levothyroxine. She denies hot or cold intolerance or palpitations, but does admit to ongoing fatigue.  ALLERGIES: Allergies  Allergen Reactions  . Neomycin-Bacitracin Zn-Polymyx Rash    Polysporin- is tolerated   . Niacin Other (See Comments) and Cough    "cough til I threw up" (12/19/2012)  . Ciprofloxacin Hives    Got cipro and flagyl at same time, localized hives to IV arm  . Flagyl [Metronidazole] Hives    Got cipro and flagyl at same time, localized hives to IV arm    MEDICATIONS: Current Outpatient  Medications on File Prior to Visit  Medication Sig Dispense Refill  . acetic acid-hydrocortisone (VOSOL-HC) OTIC solution Place 4 drops into the right ear 3 (three) times daily. 10 mL 0  . amLODipine (NORVASC) 2.5 MG tablet Take 1 tablet (2.5 mg total) by mouth 2 (two) times daily. (Patient taking differently: Take 2.5 mg by mouth daily. ) 180 tablet 1  . amoxicillin-clavulanate (AUGMENTIN) 875-125 MG tablet Take 1 tablet by mouth 2 (two) times daily. 20 tablet 0  . docusate sodium (COLACE) 100 MG capsule Take 100 mg by mouth 2 (two) times daily. Reported on 03/30/2016    . fluticasone (FLONASE) 50 MCG/ACT nasal spray Place 2 sprays into both nostrils daily. 48 g 3  . gabapentin (NEURONTIN) 100 MG capsule TAKE 2 CAPSULES BY MOUTH AT BEDTIME 180 capsule 0  . hydrochlorothiazide (HYDRODIURIL) 25 MG tablet Take 1 tablet (25 mg total) by mouth daily. 90 tablet 1  . KETOCONAZOLE, TOPICAL, 1 % SHAM Apply 1 Dose topically once a week. 200 mL 1  . KRILL OIL PO Take 750 mg by mouth daily.     Marland Kitchen levothyroxine (SYNTHROID, LEVOTHROID) 125 MCG tablet Take 1 tablet (125 mcg total) by mouth daily. 90 tablet 0  . losartan (COZAAR) 100 MG tablet TAKE 1 TABLET BY MOUTH EVERY DAY 90 tablet 1  . Multiple Vitamins-Minerals (MULTIVITAMIN WOMEN 50+ PO) Take 1 tablet by mouth daily.    . naproxen (NAPROSYN) 375 MG tablet Take 1  tablet (375 mg total) by mouth 2 (two) times daily with a meal. 180 tablet 1  . NITROSTAT 0.4 MG SL tablet PLACE 1 TABLET UNDER THE TONGUE EVERY 5 MINUTES AS NEEDED FOR CHEST PAIN 25 tablet 0  . omeprazole (PRILOSEC) 40 MG capsule TAKE 1 CAPSULE BY MOUTH TWICE A DAY 180 capsule 1  . Polyethyl Glycol-Propyl Glycol 0.4-0.3 % GEL Apply 1 drop to eye 2 (two) times daily as needed (dry eyes).     . polyethylene glycol (MIRALAX / GLYCOLAX) packet Take 17 g by mouth daily.    . Probiotic Product (PROBIOTIC ADVANCED PO) Take 2 tablets by mouth at bedtime. Digestive Advantage    . psyllium (REGULOID) 0.52  G capsule Take 0.52 g by mouth at bedtime.    . ranitidine (ZANTAC) 300 MG tablet TAKE 1 TABLET BY MOUTH EVERYDAY AT BEDTIME 90 tablet 1  . simvastatin (ZOCOR) 40 MG tablet TAKE 1 TABLET BY MOUTH EVERYDAY AT BEDTIME 90 tablet 0  . sodium chloride (OCEAN) 0.65 % SOLN nasal spray Place 1 spray into both nostrils 2 (two) times daily as needed for congestion.     . TURMERIC PO Take 1 tablet by mouth daily. Reported on 12/19/2015    . vitamin B-12 (CYANOCOBALAMIN) 1000 MCG tablet Take 100 mcg by mouth every other day.    . Vitamin D, Cholecalciferol, 1000 UNITS CAPS Take 1 capsule by mouth daily.     No current facility-administered medications on file prior to visit.     PAST MEDICAL HISTORY: Past Medical History:  Diagnosis Date  . Abnormal cervical cytology 10/25/2012   Follows with Dr Leavy Cella of Gyn  . Allergic rhinitis   . Anemia 10/06/2013  . Anginal pain ([place])    pt has history of CP states had cardiac workup with no specific issues identified   . Anxiety    when increased stress   . Arthritis    "knees; right thumb; shoulders" (15-Jan-2013)  . Asthma   . Chest pain   . Chicken pox as a child  . Complication of anesthesia   . Constipation   . Decreased hearing   . Dermatitis 03/06/2017  . Diverticulitis 10/25/2012   pt. reports that a drain was placed - 09/2012    . Dry mouth   . Excessive thirst   . External hemorrhoid, bleeding    "sometimes" (Jan 15, 2013)  . Frequent urination   . GERD (gastroesophageal reflux disease)   . H/O hiatal hernia   . Hand tingling   . Heart murmur   . HTN (hypertension)    stress test completed by Anselm Lis, diagnosed as GERD  . Hyperlipidemia   . Hypothyroidism   . Incontinence   . Infertility, female   . Insomnia   . Joint pain   . Kidney stones 1970's   "passed on their own" (Jan 15, 2013)  . Knee pain   . Left shoulder pain 09/14/2016  . Low back pain 06/09/2014  . Measles as a child  . Medicare annual wellness visit, subsequent  10/06/2013   Steinhoffer of Dermatology Pneumovax in 2012   . Memory difficulties 09/05/2017  . Obesity 09/11/2017  . OSA on CPAP   . Palpitations   . Paresthesia 03/23/2015   Left face  . PONV (postoperative nausea and vomiting)   . Preventative health care 10/06/2013   Steinhoffer of Dermatology Pneumovax in 2012  . Preventative health care 02/26/2016  . Rheumatoid arteritis   . Shortness of breath dyspnea  using stairs  . Sinus pain   . Sleep apnea   . Swelling of both lower extremities   . Thyroid cancer ([place]) 1980's  . Thyroid disease   . Tinnitus   . UTI (urinary tract infection) 04/02/2013    PAST SURGICAL HISTORY: Past Surgical History:  Procedure Laterality Date  . CHOLECYSTECTOMY  1990  . COLON SURGERY    . COLOSTOMY REVISION  12/19/2012   Procedure: COLON RESECTION SIGMOID;  Surgeon: Gwenyth Ober, MD;  Location: [facility];  Service: General;  Laterality: N/A;  . CYSTOSCOPY WITH STENT PLACEMENT  12/19/2012   Procedure: CYSTOSCOPY WITH STENT PLACEMENT;  Surgeon: Hanley Ben, MD;  Location: [facility];  Service: Urology;  Laterality: N/A;  . DILATION AND CURETTAGE OF UTERUS  1960's   "lots of them; had miscarriages" (12/19/2012)  . LYSIS OF ADHESION N/A 12/02/2015   Procedure: LAPAROSCOPIC LYSIS OF ADHESION;  Surgeon: Johnathan Hausen, MD;  Location: [facility];  Service: General;  Laterality: N/A;  . ROBOTIC ASSISTED BILATERAL SALPINGO OOPHERECTOMY Bilateral 12/02/2015   Procedure: XI ROBOTIC ASSISTED BILATERAL SALPINGO OOPHORECTOMY;  Surgeon: Everitt Amber, MD;  Location: [facility];  Service: Gynecology;  Laterality: Bilateral;  . SIGMOID RESECTION / RECTOPEXY  12/19/2012  . THYROIDECTOMY, PARTIAL  1988   "then did iodine to remove the rest" (12/19/2012)  . TONSILLECTOMY  1951?  . TRANSRECTAL DRAINAGE OF PELVIC ABSCESS  10/27/2012  . VAGINAL HYSTERECTOMY  1970's   "still have my ovaries" (12/19/2012)    SOCIAL HISTORY: Social History   Tobacco Use  . Smoking status: Never Smoker  .  Smokeless tobacco: Never Used  Substance Use Topics  . Alcohol use: No    Alcohol/week: 0.0 oz  . Drug use: No    FAMILY HISTORY: Family History  Problem Relation Age of Onset  . Heart disease Father   . Pneumonia Father   . Hypertension Father   . Hyperlipidemia Father   . Cancer Father        skin  . Stroke Father   . Alzheimer's disease Mother   . Heart disease Mother   . Depression Mother   . Emphysema Brother        marijuana and cigarettes  . Alcohol abuse Brother   . Hearing loss Brother   . Diabetes Maternal Grandmother   . Alzheimer's disease Paternal Grandmother   . Cancer Paternal Grandmother        lung?- smoker  . Hyperlipidemia Paternal Grandmother   . Heart attack Paternal Grandfather   . Alcohol abuse Paternal Grandfather   . Neurofibromatosis Son        schwanomatosis  . Neurofibromatosis Son        swanomatosis  . Cancer Paternal Aunt     ROS: Review of Systems  Constitutional: Positive for malaise/fatigue and weight loss.  Cardiovascular: Negative for palpitations.  Gastrointestinal: Negative for nausea.  Endo/Heme/Allergies:       Positive for carb cravings Negative for hypoglycemia Negative for hot or cold intolerance    PHYSICAL EXAM: Blood pressure 127/72, pulse 77, temperature 97.9 F (36.6 C), temperature source Oral, height 5\' 2"  (1.575 m), weight 171 lb (77.6 kg), SpO2 100 %. Body mass index is 31.28 kg/m. Physical Exam  Constitutional: She is oriented to person, place, and time. She appears well-developed and well-nourished.  Cardiovascular: Normal rate.  Pulmonary/Chest: Effort normal.  Musculoskeletal: Normal range of motion.  Neurological: She is oriented to person, place, and time.  Skin: Skin is warm and dry.  Psychiatric: She has a normal mood and affect. Her behavior is normal.  Vitals reviewed.   RECENT LABS AND TESTS: BMET    Component Value Date/Time   NA 137 02/24/2018 0829   K 4.0 02/24/2018 0829   CL 99  02/24/2018 0829   CO2 32 02/24/2018 0829   GLUCOSE 102 (H) 02/24/2018 0829   BUN 16 02/24/2018 0829   CREATININE 0.69 02/24/2018 0829   CREATININE 0.75 05/28/2014 0836   CALCIUM 8.8 02/24/2018 0829   GFRNONAA >60 12/03/2015 0522   GFRAA >60 12/03/2015 0522   Lab Results  Component Value Date   HGBA1C 5.8 (H) 07/18/2011   Lab Results  Component Value Date   INSULIN 7.9 02/28/2018   CBC    Component Value Date/Time   WBC 6.4 02/24/2018 0829   RBC 4.19 02/24/2018 0829   HGB 12.3 02/24/2018 0829   HCT 36.5 02/24/2018 0829   PLT 281.0 02/24/2018 0829   MCV 87.1 02/24/2018 0829   MCH 28.7 12/03/2015 0522   MCHC 33.6 02/24/2018 0829   RDW 13.7 02/24/2018 0829   LYMPHSABS 1.5 12/14/2017 0935   MONOABS 0.5 12/14/2017 0935   EOSABS 0.4 12/14/2017 0935   BASOSABS 0.0 12/14/2017 0935   Iron/TIBC/Ferritin/ %Sat    Component Value Date/Time   IRON 33 09/15/2015 0618   TIBC 249 (L) 09/15/2015 0618   FERRITIN 52 09/15/2015 0618   IRONPCTSAT 13 09/15/2015 0618   IRONPCTSAT 22 07/02/2013 1056   Lipid Panel     Component Value Date/Time   CHOL 141 12/14/2017 0935   TRIG 107.0 12/14/2017 0935   HDL 46.60 12/14/2017 0935   CHOLHDL 3 12/14/2017 0935   VLDL 21.4 12/14/2017 0935   LDLCALC 73 12/14/2017 0935   Hepatic Function Panel     Component Value Date/Time   PROT 6.4 02/24/2018 0829   ALBUMIN 3.9 02/24/2018 0829   AST 17 02/24/2018 0829   ALT 19 02/24/2018 0829   ALKPHOS 76 02/24/2018 0829   BILITOT 0.3 02/24/2018 0829   BILIDIR 0.0 11/01/2014 1116   IBILI 0.3 05/28/2014 0836      Component Value Date/Time   TSH 0.49 02/24/2018 0829   TSH 0.40 12/14/2017 0935   TSH 0.18 (L) 09/05/2017 1509   Results for IRVING, LUBBERS (MRN [id number]) as of 07/04/2018 14:41  Ref. Range 02/28/2018 10:10  Vitamin D, 25-Hydroxy Latest Ref Range: 30.0 - 100.0 ng/mL 47.4   ASSESSMENT AND PLAN: Prediabetes - Plan: Comprehensive metabolic panel, Hemoglobin A1c, Insulin,  random  Essential hypertension  Other specified hypothyroidism - Plan: T3, T4, free, TSH  Class 1 obesity with serious comorbidity and body mass index (BMI) of 31.0 to 31.9 in adult, unspecified obesity type  PLAN:  Pre-Diabetes Jackie Stevens will continue to work on weight loss, exercise, and decreasing simple carbohydrates in her diet to help decrease the risk of diabetes. She was informed that eating too many simple carbohydrates or too many calories at one sitting increases the likelihood of GI side effects. We will check Hgb A1c and insulin level today. Jackie Stevens agreed to follow up with [place] as directed to monitor her progress.  Hypertension We discussed sodium restriction, working on healthy weight loss, and a regular exercise program as the means to achieve improved blood pressure control. Jackie Stevens agreed with this plan and agreed to follow up as directed. We will check CMP today and will continue to monitor her blood pressure as well as her progress with the above lifestyle modifications. She will continue  her medications as prescribed and will watch for signs of hypotension as she continues her lifestyle modifications.  Hypothyroidism Jackie Stevens was informed of the importance of good thyroid control to help with weight loss efforts. She was also informed that supertheraputic thyroid levels are dangerous and will not improve weight loss results. We will check thyroid panel today.  Obesity Jackie Stevens is currently in the action stage of change. As such, her goal is to continue with weight loss efforts She has agreed to follow the Category 2 plan Jackie Stevens has been instructed to work up to a goal of 150 minutes of combined cardio and strengthening exercise per week for weight loss and overall health benefits. We discussed the following Behavioral Modification Strategies today: planning for success, increasing lean protein intake, increasing vegetables, work on meal planning and easy cooking plans and dealing  with family or coworker sabotage  Jackie Stevens has agreed to follow up with our clinic in 2 weeks. She was informed of the importance of frequent follow up visits to maximize her success with intensive lifestyle modifications for her multiple health conditions.   OBESITY BEHAVIORAL INTERVENTION VISIT  Today's visit was # 7 out of 22.  Starting weight: 204 lbs Starting date: 02/28/18 Today's weight : 171 lbs Today's date: 07/04/2018 Total lbs lost to date: 16    ASK: We discussed the diagnosis of obesity with Jackie Stevens today and Jackie Stevens agreed to give [place] permission to discuss obesity behavioral modification therapy today.  ASSESS: Jackie Stevens has the diagnosis of obesity and her BMI today is 31.27 Jackie Stevens is in the action stage of change   ADVISE: Jackie Stevens was educated on the multiple health risks of obesity as well as the benefit of weight loss to improve her health. She was advised of the need for long term treatment and the importance of lifestyle modifications.  AGREE: Multiple dietary modification options and treatment options were discussed and  Jackie Stevens agreed to the above obesity treatment plan.  I, Doreene Nest, am acting as transcriptionist for Eber Jones, MD  I have reviewed the above documentation for accuracy and completeness, and I agree with the above. - Jackie Stevens Qua, MD

## 2018-07-05 LAB — COMPREHENSIVE METABOLIC PANEL WITH GFR
ALT: 17 [IU]/L (ref 0–32)
AST: 14 [IU]/L (ref 0–40)
Albumin/Globulin Ratio: 2 (ref 1.2–2.2)
Albumin: 4.2 g/dL (ref 3.5–4.8)
Alkaline Phosphatase: 89 [IU]/L (ref 39–117)
BUN/Creatinine Ratio: 25 (ref 12–28)
BUN: 18 mg/dL (ref 8–27)
Bilirubin Total: 0.3 mg/dL (ref 0.0–1.2)
CO2: 27 mmol/L (ref 20–29)
Calcium: 8.9 mg/dL (ref 8.7–10.3)
Chloride: 96 mmol/L (ref 96–106)
Creatinine, Ser: 0.73 mg/dL (ref 0.57–1.00)
GFR calc Af Amer: 94 mL/min/{1.73_m2}
GFR calc non Af Amer: 82 mL/min/{1.73_m2}
Globulin, Total: 2.1 g/dL (ref 1.5–4.5)
Glucose: 92 mg/dL (ref 65–99)
Potassium: 4.3 mmol/L (ref 3.5–5.2)
Sodium: 137 mmol/L (ref 134–144)
Total Protein: 6.3 g/dL (ref 6.0–8.5)

## 2018-07-05 LAB — HEMOGLOBIN A1C
Est. average glucose Bld gHb Est-mCnc: 114 mg/dL
Hgb A1c MFr Bld: 5.6 % (ref 4.8–5.6)

## 2018-07-05 LAB — T4, FREE: Free T4: 2.27 ng/dL — ABNORMAL HIGH (ref 0.82–1.77)

## 2018-07-05 LAB — T3: T3, Total: 82 ng/dL (ref 71–180)

## 2018-07-05 LAB — TSH: TSH: 1.57 u[IU]/mL (ref 0.450–4.500)

## 2018-07-05 LAB — INSULIN, RANDOM: INSULIN: 6.4 u[IU]/mL (ref 2.6–24.9)

## 2018-07-11 ENCOUNTER — Other Ambulatory Visit: Payer: Self-pay | Admitting: Family Medicine

## 2018-07-18 ENCOUNTER — Ambulatory Visit (INDEPENDENT_AMBULATORY_CARE_PROVIDER_SITE_OTHER): Payer: PPO | Admitting: Family Medicine

## 2018-07-18 VITALS — BP 117/65 | HR 67 | Temp 98.3°F | Ht 61.0 in | Wt 170.0 lb

## 2018-07-18 DIAGNOSIS — R42 Dizziness and giddiness: Secondary | ICD-10-CM | POA: Diagnosis not present

## 2018-07-18 DIAGNOSIS — I10 Essential (primary) hypertension: Secondary | ICD-10-CM | POA: Diagnosis not present

## 2018-07-18 DIAGNOSIS — E669 Obesity, unspecified: Secondary | ICD-10-CM

## 2018-07-18 DIAGNOSIS — G4733 Obstructive sleep apnea (adult) (pediatric): Secondary | ICD-10-CM | POA: Diagnosis not present

## 2018-07-18 DIAGNOSIS — Z6832 Body mass index (BMI) 32.0-32.9, adult: Secondary | ICD-10-CM

## 2018-07-19 ENCOUNTER — Telehealth: Payer: Self-pay | Admitting: *Deleted

## 2018-07-19 NOTE — Telephone Encounter (Signed)
Advance Directives brought in, labeled and forwarded to provider for Initials; will then have scanned in to chart/SLS 08/07   

## 2018-07-19 NOTE — Progress Notes (Signed)
Office: [phone]  /  Fax: [phone]   HPI:   Chief Complaint: OBESITY Jackie Stevens is here to discuss Jackie Stevens progress with Jackie Stevens obesity treatment plan. She is on the  follow the Category 2 plan and is following Jackie Stevens eating plan approximately 75 % of the time. She states she is doing senior exercise 60 minutes 3 times per week. Jackie Stevens is feeling frustrated with the program and says he looks worse and gaunt. She reports feeling dizzy. Jackie Stevens is craving a cream filled donut. Jackie Stevens weight is 170 lb (77.1 kg) today and has had a weight loss of 1 pounds over a period of 2 weeks since Jackie Stevens last visit. She has lost 34 lbs since starting treatment with [place].  Dizziness Jackie Stevens reports feeling dizzy. Jackie Stevens blood pressure is controlled today.  Hypertension Jackie Stevens is a 72 y.o. female with hypertension.  Jackie Stevens denies chest pain, chest pressure or headache. She is working weight loss to help control Jackie Stevens blood pressure with the goal of decreasing Jackie Stevens risk of heart attack and stroke. Jackie Stevens blood pressure is controlled today.    Jackie Stevens: Jackie Stevens  Allergen Reactions  . Neomycin-Bacitracin Zn-Polymyx Rash    Polysporin- is tolerated   . Niacin Other (See Comments) and Cough    "cough til I threw up" (12/19/2012)  . Ciprofloxacin Hives    Got cipro and flagyl at same time, localized hives to IV arm  . Flagyl [Metronidazole] Hives    Got cipro and flagyl at same time, localized hives to IV arm    MEDICATIONS: Current Outpatient Medications on File Prior to Visit  Medication Sig Dispense Refill  . acetic acid-hydrocortisone (VOSOL-HC) OTIC solution Place 4 drops into the right ear 3 (three) times daily. 10 mL 0  . amoxicillin-clavulanate (AUGMENTIN) 875-125 MG tablet Take 1 tablet by mouth 2 (two) times daily. 20 tablet 0  . docusate sodium (COLACE) 100 MG capsule Take 100 mg by mouth 2 (two) times daily. Reported on 03/30/2016    . fluticasone (FLONASE) 50 MCG/ACT nasal spray Place 2  sprays into both nostrils daily. 48 g 3  . gabapentin (NEURONTIN) 100 MG capsule TAKE 2 CAPSULES BY MOUTH AT BEDTIME 180 capsule 0  . hydrochlorothiazide (HYDRODIURIL) 25 MG tablet TAKE 1 TABLET BY MOUTH EVERY DAY 90 tablet 1  . KETOCONAZOLE, TOPICAL, 1 % SHAM Apply 1 Dose topically once a week. 200 mL 1  . KRILL OIL PO Take 750 mg by mouth daily.     Marland Kitchen levothyroxine (SYNTHROID, LEVOTHROID) 125 MCG tablet Take 1 tablet (125 mcg total) by mouth daily. 90 tablet 0  . losartan (COZAAR) 100 MG tablet TAKE 1 TABLET BY MOUTH EVERY DAY 90 tablet 1  . Multiple Vitamins-Minerals (MULTIVITAMIN WOMEN 50+ PO) Take 1 tablet by mouth daily.    . naproxen (NAPROSYN) 375 MG tablet Take 1 tablet (375 mg total) by mouth 2 (two) times daily with a meal. 180 tablet 1  . NITROSTAT 0.4 MG SL tablet PLACE 1 TABLET UNDER THE TONGUE EVERY 5 MINUTES AS NEEDED FOR CHEST PAIN 25 tablet 0  . omeprazole (PRILOSEC) 40 MG capsule TAKE 1 CAPSULE BY MOUTH TWICE A DAY 180 capsule 1  . Polyethyl Glycol-Propyl Glycol 0.4-0.3 % GEL Apply 1 drop to eye 2 (two) times daily as needed (dry eyes).     . polyethylene glycol (MIRALAX / GLYCOLAX) packet Take 17 g by mouth daily.    . Probiotic Product (PROBIOTIC ADVANCED PO) Take 2 tablets by mouth  at bedtime. Digestive Advantage    . psyllium (REGULOID) 0.52 G capsule Take 0.52 g by mouth at bedtime.    . ranitidine (ZANTAC) 300 MG tablet TAKE 1 TABLET BY MOUTH EVERYDAY AT BEDTIME 90 tablet 1  . simvastatin (ZOCOR) 40 MG tablet TAKE 1 TABLET BY MOUTH EVERYDAY AT BEDTIME 90 tablet 0  . sodium chloride (OCEAN) 0.65 % SOLN nasal spray Place 1 spray into both nostrils 2 (two) times daily as needed for congestion.     . TURMERIC PO Take 1 tablet by mouth daily. Reported on 12/19/2015    . vitamin B-12 (CYANOCOBALAMIN) 1000 MCG tablet Take 100 mcg by mouth every other day.    . Vitamin D, Cholecalciferol, 1000 UNITS CAPS Take 1 capsule by mouth daily.     No current facility-administered  medications on file prior to visit.     PAST MEDICAL HISTORY: Past Medical History:  Diagnosis Date  . Abnormal cervical cytology 10/25/2012   Follows with Dr Leavy Cella of Gyn  . Allergic rhinitis   . Anemia 10/06/2013  . Anginal pain ([place])    pt has history of CP states had cardiac workup with no specific issues identified   . Anxiety    when increased stress   . Arthritis    "knees; right thumb; shoulders" (Jan 09, 2013)  . Asthma   . Chest pain   . Chicken pox as a child  . Complication of anesthesia   . Constipation   . Decreased hearing   . Dermatitis 03/06/2017  . Diverticulitis 10/25/2012   pt. reports that a drain was placed - 09/2012    . Dry mouth   . Excessive thirst   . External hemorrhoid, bleeding    "sometimes" (January 09, 2013)  . Frequent urination   . GERD (gastroesophageal reflux disease)   . H/O hiatal hernia   . Hand tingling   . Heart murmur   . HTN (hypertension)    stress test completed by Anselm Lis, diagnosed as GERD  . Hyperlipidemia   . Hypothyroidism   . Incontinence   . Infertility, female   . Insomnia   . Joint pain   . Kidney stones 1970's   "passed on their own" (01-09-2013)  . Knee pain   . Left shoulder pain 09/14/2016  . Low back pain 06/09/2014  . Measles as a child  . Medicare annual wellness visit, subsequent 10/06/2013   Steinhoffer of Dermatology Pneumovax in 2012   . Memory difficulties 09/05/2017  . Obesity 09/11/2017  . OSA on CPAP   . Palpitations   . Paresthesia 03/23/2015   Left face  . PONV (postoperative nausea and vomiting)   . Preventative health care 10/06/2013   Steinhoffer of Dermatology Pneumovax in 2012  . Preventative health care 02/26/2016  . Rheumatoid arteritis   . Shortness of breath dyspnea    using stairs  . Sinus pain   . Sleep apnea   . Swelling of both lower extremities   . Thyroid cancer ([place]) 1980's  . Thyroid disease   . Tinnitus   . UTI (urinary tract infection) 04/02/2013    PAST SURGICAL  HISTORY: Past Surgical History:  Procedure Laterality Date  . CHOLECYSTECTOMY  1990  . COLON SURGERY    . COLOSTOMY REVISION  01-09-13   Procedure: COLON RESECTION SIGMOID;  Surgeon: Gwenyth Ober, MD;  Location: [facility];  Service: General;  Laterality: N/A;  . CYSTOSCOPY WITH STENT PLACEMENT  01-09-13   Procedure: CYSTOSCOPY WITH STENT PLACEMENT;  Surgeon: Hanley Ben, MD;  Location: [facility];  Service: Urology;  Laterality: N/A;  . DILATION AND CURETTAGE OF UTERUS  1960's   "lots of them; had miscarriages" (12/19/2012)  . LYSIS OF ADHESION N/A 12/02/2015   Procedure: LAPAROSCOPIC LYSIS OF ADHESION;  Surgeon: Johnathan Hausen, MD;  Location: [facility];  Service: General;  Laterality: N/A;  . ROBOTIC ASSISTED BILATERAL SALPINGO OOPHERECTOMY Bilateral 12/02/2015   Procedure: XI ROBOTIC ASSISTED BILATERAL SALPINGO OOPHORECTOMY;  Surgeon: Everitt Amber, MD;  Location: [facility];  Service: Gynecology;  Laterality: Bilateral;  . SIGMOID RESECTION / RECTOPEXY  12/19/2012  . THYROIDECTOMY, PARTIAL  1988   "then did iodine to remove the rest" (12/19/2012)  . TONSILLECTOMY  1951?  . TRANSRECTAL DRAINAGE OF PELVIC ABSCESS  10/27/2012  . VAGINAL HYSTERECTOMY  1970's   "still have my ovaries" (12/19/2012)    SOCIAL HISTORY: Social History   Tobacco Use  . Smoking status: Never Smoker  . Smokeless tobacco: Never Used  Substance Use Topics  . Alcohol use: No    Alcohol/week: 0.0 oz  . Drug use: No    FAMILY HISTORY: Family History  Problem Relation Age of Onset  . Heart disease Father   . Pneumonia Father   . Hypertension Father   . Hyperlipidemia Father   . Cancer Father        skin  . Stroke Father   . Alzheimer's disease Mother   . Heart disease Mother   . Depression Mother   . Emphysema Brother        marijuana and cigarettes  . Alcohol abuse Brother   . Hearing loss Brother   . Diabetes Maternal Grandmother   . Alzheimer's disease Paternal Grandmother   . Cancer Paternal Grandmother         lung?- smoker  . Hyperlipidemia Paternal Grandmother   . Heart attack Paternal Grandfather   . Alcohol abuse Paternal Grandfather   . Neurofibromatosis Son        schwanomatosis  . Neurofibromatosis Son        swanomatosis  . Cancer Paternal Aunt     ROS: Review of Systems  Constitutional: Positive for weight loss.  Cardiovascular: Negative for chest pain.       Negative for chest pressure  Neurological: Negative for headaches.    PHYSICAL EXAM: Blood pressure 117/65, pulse 67, temperature 98.3 F (36.8 C), temperature source Oral, height 5\' 1"  (1.549 m), weight 170 lb (77.1 kg), SpO2 96 %. Body mass index is 32.12 kg/m. Physical Exam  Constitutional: She is oriented to person, place, and time. She appears well-developed and well-nourished.  Cardiovascular: Normal rate.  Pulmonary/Chest: Effort normal.  Musculoskeletal: Normal range of motion.  Neurological: She is oriented to person, place, and time.  Skin: Skin is warm and dry.  Psychiatric: She has a normal mood and affect. Jackie Stevens behavior is normal.  Vitals reviewed.   RECENT LABS AND TESTS: BMET    Component Value Date/Time   NA 137 07/04/2018 1035   K 4.3 07/04/2018 1035   CL 96 07/04/2018 1035   CO2 27 07/04/2018 1035   GLUCOSE 92 07/04/2018 1035   GLUCOSE 102 (H) 02/24/2018 0829   BUN 18 07/04/2018 1035   CREATININE 0.73 07/04/2018 1035   CREATININE 0.75 05/28/2014 0836   CALCIUM 8.9 07/04/2018 1035   GFRNONAA 82 07/04/2018 1035   GFRAA 94 07/04/2018 1035   Lab Results  Component Value Date   HGBA1C 5.6 07/04/2018   HGBA1C 5.8 (H) 07/18/2011  Lab Results  Component Value Date   INSULIN 6.4 07/04/2018   INSULIN 7.9 02/28/2018   CBC    Component Value Date/Time   WBC 6.4 02/24/2018 0829   RBC 4.19 02/24/2018 0829   HGB 12.3 02/24/2018 0829   HCT 36.5 02/24/2018 0829   PLT 281.0 02/24/2018 0829   MCV 87.1 02/24/2018 0829   MCH 28.7 12/03/2015 0522   MCHC 33.6 02/24/2018 0829   RDW 13.7  02/24/2018 0829   LYMPHSABS 1.5 12/14/2017 0935   MONOABS 0.5 12/14/2017 0935   EOSABS 0.4 12/14/2017 0935   BASOSABS 0.0 12/14/2017 0935   Iron/TIBC/Ferritin/ %Sat    Component Value Date/Time   IRON 33 09/15/2015 0618   TIBC 249 (L) 09/15/2015 0618   FERRITIN 52 09/15/2015 0618   IRONPCTSAT 13 09/15/2015 0618   IRONPCTSAT 22 07/02/2013 1056   Lipid Panel     Component Value Date/Time   CHOL 141 12/14/2017 0935   TRIG 107.0 12/14/2017 0935   HDL 46.60 12/14/2017 0935   CHOLHDL 3 12/14/2017 0935   VLDL 21.4 12/14/2017 0935   LDLCALC 73 12/14/2017 0935   Hepatic Function Panel     Component Value Date/Time   PROT 6.3 07/04/2018 1035   ALBUMIN 4.2 07/04/2018 1035   AST 14 07/04/2018 1035   ALT 17 07/04/2018 1035   ALKPHOS 89 07/04/2018 1035   BILITOT 0.3 07/04/2018 1035   BILIDIR 0.0 11/01/2014 1116   IBILI 0.3 05/28/2014 0836      Component Value Date/Time   TSH 1.570 07/04/2018 1035   TSH 0.49 02/24/2018 0829   TSH 0.40 12/14/2017 0935   Results for BREIA, OCAMPO (MRN [id number]) as of 07/19/2018 10:34  Ref. Range 02/28/2018 10:10  Vitamin D, 25-Hydroxy Latest Ref Range: 30.0 - 100.0 ng/mL 47.4   ASSESSMENT AND PLAN: Dizziness  Essential hypertension  Class 1 obesity with serious comorbidity and body mass index (BMI) of 32.0 to 32.9 in adult, unspecified obesity type  PLAN:  Dizziness .Jackie Stevens agrees to decrease Neurontin to 100 mg qHS (patient has medications at home) and follow up as directed.  Hypertension We discussed sodium restriction, working on healthy weight loss, and a regular exercise program as the means to achieve improved blood pressure control. Jackie Stevens agreed with this plan and agreed to follow up as directed. We will continue to monitor Jackie Stevens blood pressure as well as Jackie Stevens progress with the above lifestyle modifications. She agrees to stop Amlodipine and will watch for signs of hypotension as she continues Jackie Stevens lifestyle  modifications.  Obesity Jackie Stevens is currently in the action stage of change. As such, Jackie Stevens goal is to continue with weight loss efforts She has agreed to follow the Category 2 plan Jackie Stevens has been instructed to work up to a goal of 150 minutes of combined cardio and strengthening exercise per week for weight loss and overall health benefits. We discussed the following Behavioral Modification Strategies today: planning for success, increasing lean protein intake, increasing vegetables and work on meal planning and easy cooking plans  Jackie Stevens has agreed to follow up with our clinic in 2 weeks. She was informed of the importance of frequent follow up visits to maximize Jackie Stevens success with intensive lifestyle modifications for Jackie Stevens multiple health conditions.   OBESITY BEHAVIORAL INTERVENTION VISIT  Today's visit was # 8 out of 22.  Starting weight: 204 lbs Starting date: 02/28/18 Today's weight : 170 lbs  Today's date: 07/18/2018 Total lbs lost to date: 19    ASK: We  discussed the diagnosis of obesity with Jackie Stevens today and Jackie Stevens agreed to give [place] permission to discuss obesity behavioral modification therapy today.  ASSESS: Jackie Stevens has the diagnosis of obesity and Jackie Stevens BMI today is 32.14 [place] is in the action stage of change   ADVISE: Symia was educated on the multiple health risks of obesity as well as the benefit of weight loss to improve Jackie Stevens health. She was advised of the need for long term treatment and the importance of lifestyle modifications.  AGREE: Multiple dietary modification options and treatment options were discussed and  Hadlie agreed to the above obesity treatment plan.  I, Doreene Nest, am acting as transcriptionist for Eber Jones, MD  I have reviewed the above documentation for accuracy and completeness, and I agree with the above. - Ilene Qua, MD

## 2018-07-21 ENCOUNTER — Other Ambulatory Visit: Payer: Self-pay | Admitting: Sports Medicine

## 2018-07-28 ENCOUNTER — Ambulatory Visit: Payer: PPO | Admitting: Podiatry

## 2018-07-28 ENCOUNTER — Encounter: Payer: Self-pay | Admitting: Podiatry

## 2018-07-28 DIAGNOSIS — B351 Tinea unguium: Secondary | ICD-10-CM

## 2018-07-28 DIAGNOSIS — M79674 Pain in right toe(s): Secondary | ICD-10-CM

## 2018-07-28 DIAGNOSIS — M79675 Pain in left toe(s): Secondary | ICD-10-CM | POA: Diagnosis not present

## 2018-07-28 DIAGNOSIS — L84 Corns and callosities: Secondary | ICD-10-CM | POA: Diagnosis not present

## 2018-08-01 ENCOUNTER — Other Ambulatory Visit: Payer: Self-pay | Admitting: Family Medicine

## 2018-08-01 NOTE — Progress Notes (Signed)
Subjective: 72 y.o. returns the office today for interval calluses to the toes on her left foot as well as for painful, elongated, thickened toenails which she cannot trim herself. Denies any redness or drainage around the nails. Denies any acute changes since last appointment and no new complaints today. Denies any systemic complaints such as fevers, chills, nausea, vomiting.   PCP: Mosie Lukes, MD   Objective: AAO 3, NAD DP/PT pulses palpable, CRT less than 3 seconds  Nails hypertrophic, dystrophic, elongated, brittle, discolored 10. There is tenderness overlying the nails 1-5 bilaterally. There is no surrounding erythema or drainage along the nail sites. Keratotic lesion of the digits on the left x2.  Upon debridement there is no ongoing region, triggering signs of infection. No open lesions or pre-ulcerative lesions are identified. No other areas of tenderness bilateral lower extremities. No overlying edema, erythema, increased warmth. No pain with calf compression, swelling, warmth, erythema.  Assessment: Patient presents with symptomatic onychomycosis, keratotic lesions  Plan: -Treatment options including alternatives, risks, complications were discussed -Nails sharply debrided 10 without complication/bleeding. -Keratotic lesion sharply debrided x2 without any complications or bleeding -Discussed daily foot inspection. If there are any changes, to call the office immediately.  -Follow-up in 3 months or sooner if any problems are to arise. In the meantime, encouraged to call the office with any questions, concerns, changes symptoms.  Celesta Gentile, DPM

## 2018-08-02 ENCOUNTER — Telehealth: Payer: Self-pay | Admitting: Podiatry

## 2018-08-02 ENCOUNTER — Telehealth: Payer: Self-pay | Admitting: *Deleted

## 2018-08-02 NOTE — Telephone Encounter (Signed)
Left a message for the patient to call me back concerning the urea cream from [facility]. [name]

## 2018-08-02 NOTE — Patient Instructions (Signed)

## 2018-08-02 NOTE — Telephone Encounter (Signed)
I saw Dr. Jacqualyn Posey last week and I thought I was going to get a prescription for infected toes. He said to take it to the pharmacy to see how much it was and if not, to come back there. I didn't find out what it was or get any prescription or note of what to get. The toes are still not too good. If you can call me back at [phone] and leave a message if I'm not home.

## 2018-08-02 NOTE — Telephone Encounter (Signed)
Jackie Stevens- this was for urea cream to help with the nails and calluses. Can you call her to confirm. I think it would be best to try to get it from [facility] as opposed to her regular pharmacy.

## 2018-08-02 NOTE — Telephone Encounter (Signed)
Called and left a message for the patient to call me back and patient came by the office after I left a message and patient stated that she was in the neighborhood and just stopped by. [name]

## 2018-08-07 ENCOUNTER — Ambulatory Visit (INDEPENDENT_AMBULATORY_CARE_PROVIDER_SITE_OTHER): Payer: PPO | Admitting: Family Medicine

## 2018-08-07 VITALS — BP 153/73 | HR 68 | Temp 98.3°F | Ht 61.0 in | Wt 168.0 lb

## 2018-08-07 DIAGNOSIS — E669 Obesity, unspecified: Secondary | ICD-10-CM | POA: Diagnosis not present

## 2018-08-07 DIAGNOSIS — Z6831 Body mass index (BMI) 31.0-31.9, adult: Secondary | ICD-10-CM | POA: Diagnosis not present

## 2018-08-07 DIAGNOSIS — F3289 Other specified depressive episodes: Secondary | ICD-10-CM

## 2018-08-07 DIAGNOSIS — E559 Vitamin D deficiency, unspecified: Secondary | ICD-10-CM

## 2018-08-07 NOTE — Progress Notes (Signed)
Office: [phone]  /  Fax: [phone]   HPI:   Chief Complaint: OBESITY Jackie Stevens is here to discuss her progress with her obesity treatment plan. She is on the Category 2 plan and is following her eating plan approximately 75 % of the time. She states she is exercising 0 minutes 0 times per week. Jackie Stevens continues to do well with weight loss on her Category 2 plan. She states her hunger is controlled but she does struggle with evening cravings.  Her weight is 168 lb (76.2 kg) today and has had a weight loss of 2 pounds over a period of 3 weeks since her last visit. She has lost 36 lbs since starting treatment with [place].  Vitamin D Deficiency Jackie Stevens has a diagnosis of vitamin D deficiency. She is stable on OTC Vit D, and she is due for labs. She denies nausea, vomiting or muscle weakness.  Depression with emotional eating behaviors Jackie Stevens notes increased emotional eating, worse in the evening and she is frustrated. Jackie Stevens struggles with emotional eating and using food for comfort to the extent that it is negatively impacting her health. She often snacks when she is not hungry. Jackie Stevens sometimes feels she is out of control and then feels guilty that she made poor food choices. She has been working on behavior modification techniques to help reduce her emotional eating and has been somewhat successful. She shows no sign of suicidal or homicidal ideations.  Depression screen [facility] 2/9 05/25/2018 02/28/2018 05/24/2017 03/01/2017 09/14/2016  Decreased Interest 0 0 0 0 0  Down, Depressed, Hopeless 0 0 0 0 0  PHQ - 2 Score 0 0 0 0 0  Altered sleeping - 1 - - -  Tired, decreased energy - 1 - - -  Change in appetite - 0 - - -  Feeling bad or failure about yourself  - 0 - - -  Trouble concentrating - 2 - - -  Moving slowly or fidgety/restless - 3 - - -  Suicidal thoughts - 0 - - -  PHQ-9 Score - 7 - - -  Difficult doing work/chores - Somewhat difficult - - -    ALLERGIES: Allergies  Allergen  Reactions  . Neomycin-Bacitracin Zn-Polymyx Rash    Polysporin- is tolerated   . Niacin Other (See Comments) and Cough    "cough til I threw up" (12/19/2012)  . Ciprofloxacin Hives    Got cipro and flagyl at same time, localized hives to IV arm  . Flagyl [Metronidazole] Hives    Got cipro and flagyl at same time, localized hives to IV arm    MEDICATIONS: Current Outpatient Medications on File Prior to Visit  Medication Sig Dispense Refill  . docusate sodium (COLACE) 100 MG capsule Take 100 mg by mouth 2 (two) times daily. Reported on 03/30/2016    . fluticasone (FLONASE) 50 MCG/ACT nasal spray Place 2 sprays into both nostrils daily. 48 g 3  . gabapentin (NEURONTIN) 100 MG capsule TAKE 2 CAPSULES BY MOUTH EVERY DAY AT BEDTIME 180 capsule 0  . hydrochlorothiazide (HYDRODIURIL) 25 MG tablet TAKE 1 TABLET BY MOUTH EVERY DAY 90 tablet 1  . KETOCONAZOLE, TOPICAL, 1 % SHAM Apply 1 Dose topically once a week. 200 mL 1  . KRILL OIL PO Take 750 mg by mouth daily.     Marland Kitchen levothyroxine (SYNTHROID, LEVOTHROID) 125 MCG tablet Take 1 tablet (125 mcg total) by mouth daily. 90 tablet 0  . losartan (COZAAR) 100 MG tablet TAKE 1 TABLET BY MOUTH  EVERY DAY 90 tablet 1  . Multiple Vitamins-Minerals (MULTIVITAMIN WOMEN 50+ PO) Take 1 tablet by mouth daily.    . naproxen (NAPROSYN) 375 MG tablet TAKE 1 TABLET (375 MG TOTAL) BY MOUTH 2 (TWO) TIMES DAILY WITH A MEAL. 180 tablet 1  . NITROSTAT 0.4 MG SL tablet PLACE 1 TABLET UNDER THE TONGUE EVERY 5 MINUTES AS NEEDED FOR CHEST PAIN 25 tablet 0  . omeprazole (PRILOSEC) 40 MG capsule TAKE 1 CAPSULE BY MOUTH TWICE A DAY 180 capsule 1  . Polyethyl Glycol-Propyl Glycol 0.4-0.3 % GEL Apply 1 drop to eye 2 (two) times daily as needed (dry eyes).     . polyethylene glycol (MIRALAX / GLYCOLAX) packet Take 17 g by mouth daily.    . Probiotic Product (PROBIOTIC ADVANCED PO) Take 2 tablets by mouth at bedtime. Digestive Advantage    . psyllium (REGULOID) 0.52 G capsule Take 0.52  g by mouth at bedtime.    . ranitidine (ZANTAC) 300 MG tablet TAKE 1 TABLET BY MOUTH EVERYDAY AT BEDTIME 90 tablet 1  . simvastatin (ZOCOR) 40 MG tablet TAKE 1 TABLET BY MOUTH EVERYDAY AT BEDTIME 90 tablet 0  . sodium chloride (OCEAN) 0.65 % SOLN nasal spray Place 1 spray into both nostrils 2 (two) times daily as needed for congestion.     . TURMERIC PO Take 1 tablet by mouth daily. Reported on 12/19/2015    . vitamin B-12 (CYANOCOBALAMIN) 1000 MCG tablet Take 100 mcg by mouth every other day.    . Vitamin D, Cholecalciferol, 1000 UNITS CAPS Take 1 capsule by mouth daily.     No current facility-administered medications on file prior to visit.     PAST MEDICAL HISTORY: Past Medical History:  Diagnosis Date  . Abnormal cervical cytology 10/25/2012   Follows with Dr Leavy Cella of Gyn  . Allergic rhinitis   . Anemia 10/06/2013  . Anginal pain ([place])    pt has history of CP states had cardiac workup with no specific issues identified   . Anxiety    when increased stress   . Arthritis    "knees; right thumb; shoulders" (2013/01/14)  . Asthma   . Chest pain   . Chicken pox as a child  . Complication of anesthesia   . Constipation   . Decreased hearing   . Dermatitis 03/06/2017  . Diverticulitis 10/25/2012   pt. reports that a drain was placed - 09/2012    . Dry mouth   . Excessive thirst   . External hemorrhoid, bleeding    "sometimes" (2013-01-14)  . Frequent urination   . GERD (gastroesophageal reflux disease)   . H/O hiatal hernia   . Hand tingling   . Heart murmur   . HTN (hypertension)    stress test completed by Anselm Lis, diagnosed as GERD  . Hyperlipidemia   . Hypothyroidism   . Incontinence   . Infertility, female   . Insomnia   . Joint pain   . Kidney stones 1970's   "passed on their own" (01/14/13)  . Knee pain   . Left shoulder pain 09/14/2016  . Low back pain 06/09/2014  . Measles as a child  . Medicare annual wellness visit, subsequent 10/06/2013    Steinhoffer of Dermatology Pneumovax in 2012   . Memory difficulties 09/05/2017  . Obesity 09/11/2017  . OSA on CPAP   . Palpitations   . Paresthesia 03/23/2015   Left face  . PONV (postoperative nausea and vomiting)   . Preventative  health care 10/06/2013   Steinhoffer of Dermatology Pneumovax in 2012  . Preventative health care 02/26/2016  . Rheumatoid arteritis   . Shortness of breath dyspnea    using stairs  . Sinus pain   . Sleep apnea   . Swelling of both lower extremities   . Thyroid cancer ([place]) 1980's  . Thyroid disease   . Tinnitus   . UTI (urinary tract infection) 04/02/2013    PAST SURGICAL HISTORY: Past Surgical History:  Procedure Laterality Date  . CHOLECYSTECTOMY  1990  . COLON SURGERY    . COLOSTOMY REVISION  12/19/2012   Procedure: COLON RESECTION SIGMOID;  Surgeon: Gwenyth Ober, MD;  Location: [facility];  Service: General;  Laterality: N/A;  . CYSTOSCOPY WITH STENT PLACEMENT  12/19/2012   Procedure: CYSTOSCOPY WITH STENT PLACEMENT;  Surgeon: Hanley Ben, MD;  Location: [facility];  Service: Urology;  Laterality: N/A;  . DILATION AND CURETTAGE OF UTERUS  1960's   "lots of them; had miscarriages" (12/19/2012)  . LYSIS OF ADHESION N/A 12/02/2015   Procedure: LAPAROSCOPIC LYSIS OF ADHESION;  Surgeon: Johnathan Hausen, MD;  Location: [facility];  Service: General;  Laterality: N/A;  . ROBOTIC ASSISTED BILATERAL SALPINGO OOPHERECTOMY Bilateral 12/02/2015   Procedure: XI ROBOTIC ASSISTED BILATERAL SALPINGO OOPHORECTOMY;  Surgeon: Everitt Amber, MD;  Location: [facility];  Service: Gynecology;  Laterality: Bilateral;  . SIGMOID RESECTION / RECTOPEXY  12/19/2012  . THYROIDECTOMY, PARTIAL  1988   "then did iodine to remove the rest" (12/19/2012)  . TONSILLECTOMY  1951?  . TRANSRECTAL DRAINAGE OF PELVIC ABSCESS  10/27/2012  . VAGINAL HYSTERECTOMY  1970's   "still have my ovaries" (12/19/2012)    SOCIAL HISTORY: Social History   Tobacco Use  . Smoking status: Never Smoker  . Smokeless  tobacco: Never Used  Substance Use Topics  . Alcohol use: No    Alcohol/week: 0.0 standard drinks  . Drug use: No    FAMILY HISTORY: Family History  Problem Relation Age of Onset  . Heart disease Father   . Pneumonia Father   . Hypertension Father   . Hyperlipidemia Father   . Cancer Father        skin  . Stroke Father   . Alzheimer's disease Mother   . Heart disease Mother   . Depression Mother   . Emphysema Brother        marijuana and cigarettes  . Alcohol abuse Brother   . Hearing loss Brother   . Diabetes Maternal Grandmother   . Alzheimer's disease Paternal Grandmother   . Cancer Paternal Grandmother        lung?- smoker  . Hyperlipidemia Paternal Grandmother   . Heart attack Paternal Grandfather   . Alcohol abuse Paternal Grandfather   . Neurofibromatosis Son        schwanomatosis  . Neurofibromatosis Son        swanomatosis  . Cancer Paternal Aunt     ROS: Review of Systems  Constitutional: Positive for weight loss.  Gastrointestinal: Negative for nausea and vomiting.  Musculoskeletal:       Negative muscle weakness  Psychiatric/Behavioral: Positive for depression. Negative for suicidal ideas.    PHYSICAL EXAM: Blood pressure (!) 153/73, pulse 68, temperature 98.3 F (36.8 C), temperature source Oral, height 5\' 1"  (1.549 m), weight 168 lb (76.2 kg), SpO2 98 %. Body mass index is 31.74 kg/m. Physical Exam  Constitutional: She is oriented to person, place, and time. She appears well-developed and well-nourished.  Cardiovascular: Normal rate.  Pulmonary/Chest: Effort normal.  Musculoskeletal: Normal range of motion.  Neurological: She is oriented to person, place, and time.  Skin: Skin is warm and dry.  Psychiatric: She has a normal mood and affect. Her behavior is normal.  Vitals reviewed.   RECENT LABS AND TESTS: BMET    Component Value Date/Time   NA 137 07/04/2018 1035   K 4.3 07/04/2018 1035   CL 96 07/04/2018 1035   CO2 27 07/04/2018  1035   GLUCOSE 92 07/04/2018 1035   GLUCOSE 102 (H) 02/24/2018 0829   BUN 18 07/04/2018 1035   CREATININE 0.73 07/04/2018 1035   CREATININE 0.75 05/28/2014 0836   CALCIUM 8.9 07/04/2018 1035   GFRNONAA 82 07/04/2018 1035   GFRAA 94 07/04/2018 1035   Lab Results  Component Value Date   HGBA1C 5.6 07/04/2018   HGBA1C 5.8 (H) 07/18/2011   Lab Results  Component Value Date   INSULIN 6.4 07/04/2018   INSULIN 7.9 02/28/2018   CBC    Component Value Date/Time   WBC 6.4 02/24/2018 0829   RBC 4.19 02/24/2018 0829   HGB 12.3 02/24/2018 0829   HCT 36.5 02/24/2018 0829   PLT 281.0 02/24/2018 0829   MCV 87.1 02/24/2018 0829   MCH 28.7 12/03/2015 0522   MCHC 33.6 02/24/2018 0829   RDW 13.7 02/24/2018 0829   LYMPHSABS 1.5 12/14/2017 0935   MONOABS 0.5 12/14/2017 0935   EOSABS 0.4 12/14/2017 0935   BASOSABS 0.0 12/14/2017 0935   Iron/TIBC/Ferritin/ %Sat    Component Value Date/Time   IRON 33 09/15/2015 0618   TIBC 249 (L) 09/15/2015 0618   FERRITIN 52 09/15/2015 0618   IRONPCTSAT 13 09/15/2015 0618   IRONPCTSAT 22 07/02/2013 1056   Lipid Panel     Component Value Date/Time   CHOL 141 12/14/2017 0935   TRIG 107.0 12/14/2017 0935   HDL 46.60 12/14/2017 0935   CHOLHDL 3 12/14/2017 0935   VLDL 21.4 12/14/2017 0935   LDLCALC 73 12/14/2017 0935   Hepatic Function Panel     Component Value Date/Time   PROT 6.3 07/04/2018 1035   ALBUMIN 4.2 07/04/2018 1035   AST 14 07/04/2018 1035   ALT 17 07/04/2018 1035   ALKPHOS 89 07/04/2018 1035   BILITOT 0.3 07/04/2018 1035   BILIDIR 0.0 11/01/2014 1116   IBILI 0.3 05/28/2014 0836      Component Value Date/Time   TSH 1.570 07/04/2018 1035   TSH 0.49 02/24/2018 0829   TSH 0.40 12/14/2017 0935  Results for AKYA, FIORELLO (MRN [id number]) as of 08/07/2018 17:13  Ref. Range 02/28/2018 10:10  Vitamin D, 25-Hydroxy Latest Ref Range: 30.0 - 100.0 ng/mL 47.4    ASSESSMENT AND PLAN: Vitamin D deficiency - Plan: VITAMIN D 25 Hydroxy  (Vit-D Deficiency, Fractures)  Other depression - with emotional eating  Class 1 obesity with serious comorbidity and body mass index (BMI) of 31.0 to 31.9 in adult, unspecified obesity type  PLAN:  Vitamin D Deficiency Jackie Stevens was informed that low vitamin D levels contributes to fatigue and are associated with obesity, breast, and colon cancer. She agrees to continue to take prescription Vit D @50 ,000 IU every week and will follow up for routine testing of vitamin D, at least 2-3 times per year. She was informed of the risk of over-replacement of vitamin D and agrees to not increase her dose unless she discusses this with [place] first.  Depression with Emotional Eating Behaviors We discussed behavior modification techniques today to help Jackie Stevens deal with  her emotional eating and depression. She has agreed to take Wellbutrin SR 150 mg qd and agreed to follow up as directed.  Obesity Jackie Stevens is currently in the action stage of change. As such, her goal is to continue with weight loss efforts She has agreed to follow the Category 2 plan Jackie Stevens has been instructed to work up to a goal of 150 minutes of combined cardio and strengthening exercise per week for weight loss and overall health benefits. We discussed the following Behavioral Modification Strategies today: increasing lean protein intake, decrease eating out and emotional eating strategies   Jackie Stevens has agreed to follow up with our clinic in 2 to 3 weeks. She was informed of the importance of frequent follow up visits to maximize her success with intensive lifestyle modifications for her multiple health conditions.   OBESITY BEHAVIORAL INTERVENTION VISIT  Today's visit was # 9.   Starting weight: 204 lbs Starting date: 02/28/18 Today's weight : 168 lbs Today's date: 08/07/2018 Total lbs lost to date: 36 At least 15 minutes were spent on discussing the following behavioral intervention visit.   ASK: We discussed the diagnosis of  obesity with Jackie Stevens today and Jackie Stevens agreed to give [place] permission to discuss obesity behavioral modification therapy today.  ASSESS: Janat has the diagnosis of obesity and her BMI today is 31.76 Jackie Stevens is in the action stage of change   ADVISE: Jackie Stevens was educated on the multiple health risks of obesity as well as the benefit of weight loss to improve her health. She was advised of the need for long term treatment and the importance of lifestyle modifications to improve her current health and to decrease her risk of future health problems.  AGREE: Multiple dietary modification options and treatment options were discussed and  Jackie Stevens agreed to follow the recommendations documented in the above note.  ARRANGE: Jackie Stevens was educated on the importance of frequent visits to treat obesity as outlined per CMS and USPSTF guidelines and agreed to schedule her next follow up appointment today.  I, Trixie Dredge, am acting as transcriptionist for Dennard Nip, MD  I have reviewed the above documentation for accuracy and completeness, and I agree with the above. -Dennard Nip, MD

## 2018-08-08 ENCOUNTER — Telehealth: Payer: Self-pay | Admitting: Podiatry

## 2018-08-08 LAB — VITAMIN D 25 HYDROXY (VIT D DEFICIENCY, FRACTURES): Vit D, 25-Hydroxy: 61.1 ng/mL (ref 30.0–100.0)

## 2018-08-08 NOTE — Telephone Encounter (Signed)
Jackie Stevens called to say Medication was too expensive. Please call she has a question for you

## 2018-08-22 ENCOUNTER — Other Ambulatory Visit: Payer: Self-pay | Admitting: Family Medicine

## 2018-08-22 ENCOUNTER — Ambulatory Visit (INDEPENDENT_AMBULATORY_CARE_PROVIDER_SITE_OTHER): Payer: PPO | Admitting: Family Medicine

## 2018-08-22 VITALS — BP 157/74 | HR 76 | Temp 97.8°F | Ht 61.0 in | Wt 169.0 lb

## 2018-08-22 DIAGNOSIS — E669 Obesity, unspecified: Secondary | ICD-10-CM | POA: Diagnosis not present

## 2018-08-22 DIAGNOSIS — I10 Essential (primary) hypertension: Secondary | ICD-10-CM

## 2018-08-22 DIAGNOSIS — Z6832 Body mass index (BMI) 32.0-32.9, adult: Secondary | ICD-10-CM | POA: Diagnosis not present

## 2018-08-23 NOTE — Progress Notes (Signed)
Office: [phone]  /  Fax: [phone]   HPI:   Chief Complaint: OBESITY Jackie Stevens is here to discuss her progress with her obesity treatment plan. She is on the Category 2 plan and is following her eating plan approximately 75 % of the time. She states she is walking for 60 minutes 1 time per week. Jackie Stevens has struggled with increased stress eating over the last 2-3 weeks and she is getting ready to travel, which may lead to increase temptations.  Her weight is 169 lb (76.7 kg) today and has gained 1 pound since her last visit. She has lost 35 lbs since starting treatment with [place].  Hypertension Jackie Stevens is a 72 y.o. female with hypertension. Jackie Stevens's blood pressure is elevated again today. Her blood pressure medications were lowered when she was feeling lightheaded with low blood pressure, but the lightheadedness has not improved. She is working weight loss to help control her blood pressure with the goal of decreasing her risk of heart attack and stroke. Jackie Stevens's blood pressure is not currently controlled.  ALLERGIES: Allergies  Allergen Reactions  . Neomycin-Bacitracin Zn-Polymyx Rash    Polysporin- is tolerated   . Niacin Other (See Comments) and Cough    "cough til I threw up" (12/19/2012)  . Ciprofloxacin Hives    Got cipro and flagyl at same time, localized hives to IV arm  . Flagyl [Metronidazole] Hives    Got cipro and flagyl at same time, localized hives to IV arm    MEDICATIONS: Current Outpatient Medications on File Prior to Visit  Medication Sig Dispense Refill  . docusate sodium (COLACE) 100 MG capsule Take 100 mg by mouth 2 (two) times daily. Reported on 03/30/2016    . fluticasone (FLONASE) 50 MCG/ACT nasal spray Place 2 sprays into both nostrils daily. 48 g 3  . gabapentin (NEURONTIN) 100 MG capsule TAKE 2 CAPSULES BY MOUTH EVERY DAY AT BEDTIME 180 capsule 0  . hydrochlorothiazide (HYDRODIURIL) 25 MG tablet TAKE 1 TABLET BY MOUTH EVERY DAY 90 tablet 1  .  KETOCONAZOLE, TOPICAL, 1 % SHAM Apply 1 Dose topically once a week. 200 mL 1  . KRILL OIL PO Take 750 mg by mouth daily.     Marland Kitchen levothyroxine (SYNTHROID, LEVOTHROID) 125 MCG tablet Take 1 tablet (125 mcg total) by mouth daily. 90 tablet 0  . losartan (COZAAR) 100 MG tablet TAKE 1 TABLET BY MOUTH EVERY DAY 90 tablet 1  . Multiple Vitamins-Minerals (MULTIVITAMIN WOMEN 50+ PO) Take 1 tablet by mouth daily.    . naproxen (NAPROSYN) 375 MG tablet TAKE 1 TABLET (375 MG TOTAL) BY MOUTH 2 (TWO) TIMES DAILY WITH A MEAL. 180 tablet 1  . NITROSTAT 0.4 MG SL tablet PLACE 1 TABLET UNDER THE TONGUE EVERY 5 MINUTES AS NEEDED FOR CHEST PAIN 25 tablet 0  . omeprazole (PRILOSEC) 40 MG capsule TAKE 1 CAPSULE BY MOUTH TWICE A DAY 180 capsule 1  . Polyethyl Glycol-Propyl Glycol 0.4-0.3 % GEL Apply 1 drop to eye 2 (two) times daily as needed (dry eyes).     . polyethylene glycol (MIRALAX / GLYCOLAX) packet Take 17 g by mouth daily.    . Probiotic Product (PROBIOTIC ADVANCED PO) Take 2 tablets by mouth at bedtime. Digestive Advantage    . psyllium (REGULOID) 0.52 G capsule Take 0.52 g by mouth at bedtime.    . ranitidine (ZANTAC) 300 MG tablet TAKE 1 TABLET BY MOUTH EVERYDAY AT BEDTIME 90 tablet 1  . sodium chloride (OCEAN) 0.65 %  SOLN nasal spray Place 1 spray into both nostrils 2 (two) times daily as needed for congestion.     . TURMERIC PO Take 1 tablet by mouth daily. Reported on 12/19/2015    . vitamin B-12 (CYANOCOBALAMIN) 1000 MCG tablet Take 100 mcg by mouth every other day.    . Vitamin D, Cholecalciferol, 1000 UNITS CAPS Take 1 capsule by mouth daily.     No current facility-administered medications on file prior to visit.     PAST MEDICAL HISTORY: Past Medical History:  Diagnosis Date  . Abnormal cervical cytology 10/25/2012   Follows with Dr Leavy Cella of Gyn  . Allergic rhinitis   . Anemia 10/06/2013  . Anginal pain ([place])    pt has history of CP states had cardiac workup with no specific issues  identified   . Anxiety    when increased stress   . Arthritis    "knees; right thumb; shoulders" (01-16-13)  . Asthma   . Chest pain   . Chicken pox as a child  . Complication of anesthesia   . Constipation   . Decreased hearing   . Dermatitis 03/06/2017  . Diverticulitis 10/25/2012   pt. reports that a drain was placed - 09/2012    . Dry mouth   . Excessive thirst   . External hemorrhoid, bleeding    "sometimes" (01/16/13)  . Frequent urination   . GERD (gastroesophageal reflux disease)   . H/O hiatal hernia   . Hand tingling   . Heart murmur   . HTN (hypertension)    stress test completed by Anselm Lis, diagnosed as GERD  . Hyperlipidemia   . Hypothyroidism   . Incontinence   . Infertility, female   . Insomnia   . Joint pain   . Kidney stones 1970's   "passed on their own" (01-16-13)  . Knee pain   . Left shoulder pain 09/14/2016  . Low back pain 06/09/2014  . Measles as a child  . Medicare annual wellness visit, subsequent 10/06/2013   Jackie Stevens Pneumovax in 2012   . Memory difficulties 09/05/2017  . Obesity 09/11/2017  . OSA on CPAP   . Palpitations   . Paresthesia 03/23/2015   Left face  . PONV (postoperative nausea and vomiting)   . Preventative health care 10/06/2013   Jackie Stevens Pneumovax in 2012  . Preventative health care 02/26/2016  . Rheumatoid arteritis   . Shortness of breath dyspnea    using stairs  . Sinus pain   . Sleep apnea   . Swelling of both lower extremities   . Thyroid cancer ([place]) 1980's  . Thyroid disease   . Tinnitus   . UTI (urinary tract infection) 04/02/2013    PAST SURGICAL HISTORY: Past Surgical History:  Procedure Laterality Date  . CHOLECYSTECTOMY  1990  . COLON SURGERY    . COLOSTOMY REVISION  01/16/2013   Procedure: COLON RESECTION SIGMOID;  Surgeon: Gwenyth Ober, MD;  Location: [facility];  Service: General;  Laterality: N/A;  . CYSTOSCOPY WITH STENT PLACEMENT  January 16, 2013   Procedure:  CYSTOSCOPY WITH STENT PLACEMENT;  Surgeon: Hanley Ben, MD;  Location: [facility];  Service: Urology;  Laterality: N/A;  . DILATION AND CURETTAGE OF UTERUS  1960's   "lots of them; had miscarriages" (16-Jan-2013)  . LYSIS OF ADHESION N/A 12/02/2015   Procedure: LAPAROSCOPIC LYSIS OF ADHESION;  Surgeon: Johnathan Hausen, MD;  Location: [facility];  Service: General;  Laterality: N/A;  . ROBOTIC ASSISTED  BILATERAL SALPINGO OOPHERECTOMY Bilateral 12/02/2015   Procedure: XI ROBOTIC ASSISTED BILATERAL SALPINGO OOPHORECTOMY;  Surgeon: Everitt Amber, MD;  Location: [facility];  Service: Gynecology;  Laterality: Bilateral;  . SIGMOID RESECTION / RECTOPEXY  12/19/2012  . THYROIDECTOMY, PARTIAL  1988   "then did iodine to remove the rest" (12/19/2012)  . TONSILLECTOMY  1951?  . TRANSRECTAL DRAINAGE OF PELVIC ABSCESS  10/27/2012  . VAGINAL HYSTERECTOMY  1970's   "still have my ovaries" (12/19/2012)    SOCIAL HISTORY: Social History   Tobacco Use  . Smoking status: Never Smoker  . Smokeless tobacco: Never Used  Substance Use Topics  . Alcohol use: No    Alcohol/week: 0.0 standard drinks  . Drug use: No    FAMILY HISTORY: Family History  Problem Relation Age of Onset  . Heart disease Father   . Pneumonia Father   . Hypertension Father   . Hyperlipidemia Father   . Cancer Father        skin  . Stroke Father   . Alzheimer's disease Mother   . Heart disease Mother   . Depression Mother   . Emphysema Brother        marijuana and cigarettes  . Alcohol abuse Brother   . Hearing loss Brother   . Diabetes Maternal Grandmother   . Alzheimer's disease Paternal Grandmother   . Cancer Paternal Grandmother        lung?- smoker  . Hyperlipidemia Paternal Grandmother   . Heart attack Paternal Grandfather   . Alcohol abuse Paternal Grandfather   . Neurofibromatosis Son        schwanomatosis  . Neurofibromatosis Son        swanomatosis  . Cancer Paternal Aunt     ROS: Review of Systems  Constitutional:  Negative for weight loss.  Neurological:       Positive lightheadedness    PHYSICAL EXAM: Blood pressure (!) 157/74, pulse 76, temperature 97.8 F (36.6 C), temperature source Oral, height 5\' 1"  (1.549 m), weight 169 lb (76.7 kg), SpO2 97 %. Body mass index is 31.93 kg/m. Physical Exam  Constitutional: She is oriented to person, place, and time. She appears well-developed and well-nourished.  Cardiovascular: Normal rate.  Pulmonary/Chest: Effort normal.  Neurological: She is oriented to person, place, and time.  Skin: Skin is warm and dry.  Psychiatric: She has a normal mood and affect. Her behavior is normal.  Vitals reviewed.   RECENT LABS AND TESTS: BMET    Component Value Date/Time   NA 137 07/04/2018 1035   K 4.3 07/04/2018 1035   CL 96 07/04/2018 1035   CO2 27 07/04/2018 1035   GLUCOSE 92 07/04/2018 1035   GLUCOSE 102 (H) 02/24/2018 0829   BUN 18 07/04/2018 1035   CREATININE 0.73 07/04/2018 1035   CREATININE 0.75 05/28/2014 0836   CALCIUM 8.9 07/04/2018 1035   GFRNONAA 82 07/04/2018 1035   GFRAA 94 07/04/2018 1035   Lab Results  Component Value Date   HGBA1C 5.6 07/04/2018   HGBA1C 5.8 (H) 07/18/2011   Lab Results  Component Value Date   INSULIN 6.4 07/04/2018   INSULIN 7.9 02/28/2018   CBC    Component Value Date/Time   WBC 6.4 02/24/2018 0829   RBC 4.19 02/24/2018 0829   HGB 12.3 02/24/2018 0829   HCT 36.5 02/24/2018 0829   PLT 281.0 02/24/2018 0829   MCV 87.1 02/24/2018 0829   MCH 28.7 12/03/2015 0522   MCHC 33.6 02/24/2018 0829   RDW 13.7 02/24/2018  0829   LYMPHSABS 1.5 12/14/2017 0935   MONOABS 0.5 12/14/2017 0935   EOSABS 0.4 12/14/2017 0935   BASOSABS 0.0 12/14/2017 0935   Iron/TIBC/Ferritin/ %Sat    Component Value Date/Time   IRON 33 09/15/2015 0618   TIBC 249 (L) 09/15/2015 0618   FERRITIN 52 09/15/2015 0618   IRONPCTSAT 13 09/15/2015 0618   IRONPCTSAT 22 07/02/2013 1056   Lipid Panel     Component Value Date/Time   CHOL 141  12/14/2017 0935   TRIG 107.0 12/14/2017 0935   HDL 46.60 12/14/2017 0935   CHOLHDL 3 12/14/2017 0935   VLDL 21.4 12/14/2017 0935   LDLCALC 73 12/14/2017 0935   Hepatic Function Panel     Component Value Date/Time   PROT 6.3 07/04/2018 1035   ALBUMIN 4.2 07/04/2018 1035   AST 14 07/04/2018 1035   ALT 17 07/04/2018 1035   ALKPHOS 89 07/04/2018 1035   BILITOT 0.3 07/04/2018 1035   BILIDIR 0.0 11/01/2014 1116   IBILI 0.3 05/28/2014 0836      Component Value Date/Time   TSH 1.570 07/04/2018 1035   TSH 0.49 02/24/2018 0829   TSH 0.40 12/14/2017 0935    ASSESSMENT AND PLAN: Essential hypertension  Class 1 obesity with serious comorbidity and body mass index (BMI) of 32.0 to 32.9 in adult, unspecified obesity type  PLAN:  Hypertension We discussed sodium restriction, working on healthy weight loss, and a regular exercise program as the means to achieve improved blood pressure control. Jackie Stevens agreed with this plan and agreed to follow up as directed. We will continue to monitor her blood pressure as well as her progress with the above lifestyle modifications. Jackie Stevens will continue her medications and will watch for signs of hypotension as she continues her lifestyle modifications. She is to keep her ENT visit next week and we will follow up won blood pressure at her next appointment. Jackie Stevens agrees to follow up with our clinic in 3 weeks.  I spent > than 50% of the 15 minute visit on counseling as documented in the note.  Obesity Jackie Stevens is currently in the action stage of change. As such, her goal is to continue with weight loss efforts She has agreed to follow the Category 2 plan Jackie Stevens has been instructed to work up to a goal of 150 minutes of combined cardio and strengthening exercise per week for weight loss and overall health benefits. We discussed the following Behavioral Modification Strategies today: increasing lean protein intake, dealing with family or coworker sabotage,  travel eating strategies, emotional eating strategies, and avoiding temptations   Jackie Stevens has agreed to follow up with our clinic in 3 weeks. She was informed of the importance of frequent follow up visits to maximize her success with intensive lifestyle modifications for her multiple health conditions.   OBESITY BEHAVIORAL INTERVENTION VISIT  Today's visit was # 10   Starting weight: 204 lbs Starting date: 02/28/18 Today's weight : 169 lbs  Today's date: 08/22/2018 Total lbs lost to date: 49    ASK: We discussed the diagnosis of obesity with Jackie Stevens today and Jackie Stevens agreed to give [place] permission to discuss obesity behavioral modification therapy today.  ASSESS: Jackie Stevens has the diagnosis of obesity and her BMI today is 31.95 Jackie Stevens is in the action stage of change   ADVISE: Jackie Stevens was educated on the multiple health risks of obesity as well as the benefit of weight loss to improve her health. She was advised of the need for long term  treatment and the importance of lifestyle modifications to improve her current health and to decrease her risk of future health problems.  AGREE: Multiple dietary modification options and treatment options were discussed and  Jackie Stevens agreed to follow the recommendations documented in the above note.  ARRANGE: Jackie Stevens was educated on the importance of frequent visits to treat obesity as outlined per CMS and USPSTF guidelines and agreed to schedule her next follow up appointment today.  I, Trixie Dredge, am acting as transcriptionist for Dennard Nip, MD  I have reviewed the above documentation for accuracy and completeness, and I agree with the above. -Dennard Nip, MD

## 2018-08-24 ENCOUNTER — Ambulatory Visit: Payer: PPO | Admitting: Sports Medicine

## 2018-08-28 DIAGNOSIS — G4733 Obstructive sleep apnea (adult) (pediatric): Secondary | ICD-10-CM | POA: Diagnosis not present

## 2018-08-31 ENCOUNTER — Ambulatory Visit: Payer: Self-pay

## 2018-08-31 ENCOUNTER — Ambulatory Visit (INDEPENDENT_AMBULATORY_CARE_PROVIDER_SITE_OTHER): Payer: PPO | Admitting: Sports Medicine

## 2018-08-31 ENCOUNTER — Encounter: Payer: Self-pay | Admitting: Sports Medicine

## 2018-08-31 VITALS — BP 130/72 | HR 69 | Ht 61.0 in | Wt 170.2 lb

## 2018-08-31 DIAGNOSIS — M25511 Pain in right shoulder: Secondary | ICD-10-CM

## 2018-08-31 DIAGNOSIS — M19011 Primary osteoarthritis, right shoulder: Secondary | ICD-10-CM | POA: Diagnosis not present

## 2018-08-31 DIAGNOSIS — M24612 Ankylosis, left shoulder: Secondary | ICD-10-CM

## 2018-08-31 DIAGNOSIS — M19012 Primary osteoarthritis, left shoulder: Secondary | ICD-10-CM

## 2018-08-31 DIAGNOSIS — G8929 Other chronic pain: Secondary | ICD-10-CM

## 2018-08-31 DIAGNOSIS — M25512 Pain in left shoulder: Secondary | ICD-10-CM | POA: Diagnosis not present

## 2018-08-31 NOTE — Procedures (Signed)
PROCEDURE NOTE:  Ultrasound Guided: Injection: Bilateral shoulder Images were obtained and interpreted by myself, Teresa Coombs, DO  Images have been saved and stored to PACS system. Images obtained on: GE S7 Ultrasound machine    ULTRASOUND FINDINGS:  Marked degenerative changes  DESCRIPTION OF PROCEDURE:  The patient's clinical condition is marked by substantial pain and/or significant functional disability. Other conservative therapy has not provided relief, is contraindicated, or not appropriate. There is a reasonable likelihood that injection will significantly improve the patient's pain and/or functional impairment.   After discussing the risks, benefits and expected outcomes of the injection and all questions were reviewed and answered, the patient wished to undergo the above named procedure.  Verbal consent was obtained.  The ultrasound was used to identify the target structure and adjacent neurovascular structures. The skin was then prepped in sterile fashion and the target structure was injected under direct visualization using sterile technique as below:  Bilateral injections performed under identical technique as below: PREP: Alcohol and Ethel Chloride APPROACH:posterior, single injection, 21g 2 in. INJECTATE: 2 cc 0.5% Marcaine and 1 cc 40mg /mL DepoMedrol ASPIRATE: None DRESSING: Band-Aid  Post procedural instructions including recommending icing and warning signs for infection were reviewed.    This procedure was well tolerated and there were no complications.   IMPRESSION: Succesful Ultrasound Guided: Injection

## 2018-08-31 NOTE — Patient Instructions (Addendum)

## 2018-08-31 NOTE — Progress Notes (Signed)
Jackie Stevens. Jackie Stevens, [facility] [place] - 72 y.o. female MRN [id number]  Date of birth: 1946/01/27  Visit Date: 08/31/2018  PCP: Mosie Lukes, MD   Referred by: Mosie Lukes, MD  Scribe for today's visit: Josepha Pigg, CMA     SUBJECTIVE:  Jackie Stevens is here for Follow-up (B shoulder pain) .   History of present illness Her L shoulder pain symptoms INITIALLY: Began 6 months ago insidious onset  Described as 4-5/10 sharp pain, radiating to L upper arm Worsened with overhead and cross-body ROM Improved with nothing noted Additional associated symptoms include: radiating pain into the L upper arm, grinding sensation.  N/T in B hands noted.   At this time symptoms show no change compared to onset  She has tried [place] w/ no relief.  04/25/18: Compared to the last office visit on 01/23/18, her previously described L shld pain symptoms are improving w/ decreased pain and improved L shoulder ROM.  Most problematic motion is L shoulder IR.  Pt states that she feels 80% improved. Current symptoms are mild & are nonradiating She had an injection at her last visit and feels that this really helped her.  08/31/2018: Compared to the last office visit, her previously described symptoms are worsening, R>L. She is R hand dominant. Today when she was making the bed she lifted the corner of the mattress and heard and felt a pop in the R shoulder. Pain has not changes since the incident.  Current symptoms are severe & are radiating to the deltoid. She denies neck pain.  She has been taking Naproxen BID (for unrelated issue) but reports not relief with shoulder pain.   Review of systems Denies night time disturbances. Denies fevers, chills, or night sweats. Denies unexplained weight loss. Reports personal history of cancer - thyroid CA Denies changes in bowel or bladder habits. Denies recent unreported  falls. Denies new or worsening dyspnea or wheezing. Reports headaches or dizziness.  Denies numbness, tingling or weakness  In the extremities. Reports dizziness or presyncopal episodes Denies lower extremity edema    HISTORY:  Prior history reviewed and updated per electronic medical record.  Social History   Occupational History  . Occupation: Retired  Tobacco Use  . Smoking status: Never Smoker  . Smokeless tobacco: Never Used  Substance and Sexual Activity  . Alcohol use: No    Alcohol/week: 0.0 standard drinks  . Drug use: No  . Sexual activity: Not Currently   Social History   Social History Narrative   Married    children    DATA OBTAINED & REVIEWED:   Recent Labs    07/04/18 1035  HGBA1C 5.6   . 04/25/2018: Bilateral shoulder x-rays show moderate degenerative changes of the right shoulder and severe degenerative changes of the left. .   OBJECTIVE:  VS:  HT:5\' 1"  (154.9 cm)   WT:170 lb 3.2 oz (77.2 kg)  BMI:32.18    BP:130/72  HR:69bpm  TEMP: ( )  RESP:98 %   PHYSICAL EXAM: CONSTITUTIONAL: Well-developed, Well-nourished and In no acute distress PSYCHIATRIC: Alert & appropriately interactive. and Not depressed or anxious appearing. RESPIRATORY: No increased work of breathing and Trachea Midline EYES: Pupils are equal., EOM intact without nystagmus. and No scleral icterus.  VASCULAR EXAM: Warm and well perfused NEURO: unremarkable Normal associated myotomal distribution strength to manual muscle testing Normal sensation to light touch  MSK Exam: Bilateral shoulder  Well aligned, no significant deformity. No overlying skin changes. Generalized TTP over the anterior and lateral shoulders.   RANGE OF MOTION & STRENGTH  Right shoulder with marked limitations in overhead range of motion.  Abduction to 110 degrees.  Left shoulder 120 degrees.   SPECIALITY TESTING:  Normal drop arm test bilaterally.  Marked pain with internal and external range of  motion bilateral shoulders, worse at 30 degrees of abduction.         ASSESSMENT   1. Chronic pain of both shoulders     PLAN:  Pertinent additional documentation may be included in corresponding procedure notes, imaging studies, problem based documentation and patient instructions.  Procedures:  . US Guided Injection per procedure note  Medications:  No orders of the defined types were placed in this encounter.  Discussion/Instructions: No problem-specific Assessment & Plan notes found for this encounter.  Jackie Stevens Ultrasound-guided injection today.  We can repeat these at follow-up if needed she has moderate to advanced degenerative changes but is not interested in surgery at this time.  She would be a candidate for total shoulder arthroplasty bilaterally. . Discussed red flag symptoms that warrant earlier emergent evaluation and patient voices understanding. . Activity modifications and the importance of avoiding exacerbating activities (limiting pain to no more than a 4 / 10 during or following activity) recommended and discussed.  Follow-up:  . No follow-ups on file.   . If any lack of improvement consider: referral to Physical Therapy and referral to Orthopedics for Total shoulder arthroplasty  . At follow up will plan to consider: repeat corticosteroid injections     CMA/ATC served as scribe during this visit. History, Physical, and Plan performed by medical provider. Documentation and orders reviewed and attested to.      Jackie Stevens, [facility] Physician

## 2018-09-01 DIAGNOSIS — Z1231 Encounter for screening mammogram for malignant neoplasm of breast: Secondary | ICD-10-CM | POA: Diagnosis not present

## 2018-09-01 LAB — HM MAMMOGRAPHY

## 2018-09-04 ENCOUNTER — Encounter: Payer: Self-pay | Admitting: Family Medicine

## 2018-09-04 ENCOUNTER — Ambulatory Visit (INDEPENDENT_AMBULATORY_CARE_PROVIDER_SITE_OTHER): Payer: PPO | Admitting: Family Medicine

## 2018-09-04 VITALS — BP 144/74 | HR 67 | Temp 97.6°F | Resp 16 | Ht 61.0 in | Wt 168.0 lb

## 2018-09-04 DIAGNOSIS — E782 Mixed hyperlipidemia: Secondary | ICD-10-CM

## 2018-09-04 DIAGNOSIS — M25511 Pain in right shoulder: Secondary | ICD-10-CM

## 2018-09-04 DIAGNOSIS — I10 Essential (primary) hypertension: Secondary | ICD-10-CM

## 2018-09-04 DIAGNOSIS — K219 Gastro-esophageal reflux disease without esophagitis: Secondary | ICD-10-CM | POA: Diagnosis not present

## 2018-09-04 MED ORDER — HYOSCYAMINE SULFATE 0.125 MG SL SUBL: 0.1250 mg | SUBLINGUAL_TABLET | SUBLINGUAL | 1 refills | Status: DC | PRN

## 2018-09-04 NOTE — Assessment & Plan Note (Signed)
Has just restarted the Amlodipine this week and blood pressure is still running mildly high, no changes for now

## 2018-09-04 NOTE — Patient Instructions (Addendum)
Shingrix is the new shingles shot, 2 shots over 2-6 months at pharmacy  CoverMyMeds and GoodRx compares prices of meds online  Drink water 60 to 80 oz of fluids daily to help with dehydration  Lidocaine Gel now made by [facility], [employer] and [employer] the companies to shoulder  Dizziness Dizziness is a common problem. It is a feeling of unsteadiness or light-headedness. You may feel like you are about to faint. Dizziness can lead to injury if you stumble or fall. Anyone can become dizzy, but dizziness is more common in older adults. This condition can be caused by a number of things, including medicines, dehydration, or illness. Follow these instructions at home: Eating and drinking  Drink enough fluid to keep your urine clear or pale yellow. This helps to keep you from becoming dehydrated. Try to drink more clear fluids, such as water.  Do not drink alcohol.  Limit your caffeine intake if told to do so by your health care provider. Check ingredients and nutrition facts to see if a food or beverage contains caffeine.  Limit your salt (sodium) intake if told to do so by your health care provider. Check ingredients and nutrition facts to see if a food or beverage contains sodium. Activity  Avoid making quick movements. ? Rise slowly from chairs and steady yourself until you feel okay. ? In the morning, first sit up on the side of the bed. When you feel okay, stand slowly while you hold onto something until you know that your balance is fine.  If you need to stand in one place for a long time, move your legs often. Tighten and relax the muscles in your legs while you are standing.  Do not drive or use heavy machinery if you feel dizzy.  Avoid bending down if you feel dizzy. Place items in your home so that they are easy for you to reach without leaning over. Lifestyle  Do not use any products that contain nicotine or tobacco, such as cigarettes and e-cigarettes. If you need help  quitting, ask your health care provider.  Try to reduce your stress level by using methods such as yoga or meditation. Talk with your health care provider if you need help to manage your stress. General instructions  Watch your dizziness for any changes.  Take over-the-counter and prescription medicines only as told by your health care provider. Talk with your health care provider if you think that your dizziness is caused by a medicine that you are taking.  Tell a friend or a family member that you are feeling dizzy. If he or she notices any changes in your behavior, have this person call your health care provider.  Keep all follow-up visits as told by your health care provider. This is important. Contact a health care provider if:  Your dizziness does not go away.  Your dizziness or light-headedness gets worse.  You feel nauseous.  You have reduced hearing.  You have new symptoms.  You are unsteady on your feet or you feel like the room is spinning. Get help right away if:  You vomit or have diarrhea and are unable to eat or drink anything.  You have problems talking, walking, swallowing, or using your arms, hands, or legs.  You feel generally weak.  You are not thinking clearly or you have trouble forming sentences. It may take a friend or family member to notice this.  You have chest pain, abdominal pain, shortness of breath, or sweating.  Your vision changes.  You have any bleeding.  You have a severe headache.  You have neck pain or a stiff neck.  You have a fever. These symptoms may represent a serious problem that is an emergency. Do not wait to see if the symptoms will go away. Get medical help right away. Call your local emergency services (911 in the [place] Do not drive yourself to the hospital. Summary  Dizziness is a feeling of unsteadiness or light-headedness. This condition can be caused by a number of things, including medicines, dehydration, or  illness.  Anyone can become dizzy, but dizziness is more common in older adults.  Drink enough fluid to keep your urine clear or pale yellow. Do not drink alcohol.  Avoid making quick movements if you feel dizzy. Monitor your dizziness for any changes. This information is not intended to replace advice given to you by your health care provider. Make sure you discuss any questions you have with your health care provider. Document Released: 05/25/2001 Document Revised: 01/01/2017 Document Reviewed: 01/01/2017 Elsevier Interactive Patient Education  [employer].

## 2018-09-06 NOTE — Progress Notes (Signed)
Subjective:    Patient ID: Jackie Stevens, female    DOB: [birth year], 72 y.o.   MRN: [id number]  Chief Complaint  Patient presents with  . Hypertension    Here for follow up  2  HPI Patient is in today for follow up. She feels well today although she does note she has trouble with feeling light headed and even dizzy at times. No falls or trauma. No headaches, tinnitus o hearing loss. No visual changes. She is continuing to struggle with joint pain but it is improved some. Denies CP/palp/SOB/HA/congestion/fevers/GI or GU c/o. Taking meds as prescribed  Past Medical History:  Diagnosis Date  . Abnormal cervical cytology 10/25/2012   Follows with Dr Leavy Cella of Gyn  . Allergic rhinitis   . Anemia 10/06/2013  . Anginal pain ([place])    pt has history of CP states had cardiac workup with no specific issues identified   . Anxiety    when increased stress   . Arthritis    "knees; right thumb; shoulders" (12/20/12)  . Asthma   . Chest pain   . Chicken pox as a child  . Complication of anesthesia   . Constipation   . Decreased hearing   . Dermatitis 03/06/2017  . Diverticulitis 10/25/2012   pt. reports that a drain was placed - 09/2012    . Dry mouth   . Excessive thirst   . External hemorrhoid, bleeding    "sometimes" (December 20, 2012)  . Frequent urination   . GERD (gastroesophageal reflux disease)   . H/O hiatal hernia   . Hand tingling   . Heart murmur   . HTN (hypertension)    stress test completed by Anselm Lis, diagnosed as GERD  . Hyperlipidemia   . Hypothyroidism   . Incontinence   . Infertility, female   . Insomnia   . Joint pain   . Kidney stones 1970's   "passed on their own" (2012-12-20)  . Knee pain   . Left shoulder pain 09/14/2016  . Low back pain 06/09/2014  . Measles as a child  . Medicare annual wellness visit, subsequent 10/06/2013   Steinhoffer of Dermatology Pneumovax in 2012   . Memory difficulties 09/05/2017  . Obesity 09/11/2017  . OSA on CPAP     . Palpitations   . Paresthesia 03/23/2015   Left face  . PONV (postoperative nausea and vomiting)   . Preventative health care 10/06/2013   Steinhoffer of Dermatology Pneumovax in 2012  . Preventative health care 02/26/2016  . Rheumatoid arteritis   . Shortness of breath dyspnea    using stairs  . Sinus pain   . Sleep apnea   . Swelling of both lower extremities   . Thyroid cancer ([place]) 1980's  . Thyroid disease   . Tinnitus   . UTI (urinary tract infection) 04/02/2013    Past Surgical History:  Procedure Laterality Date  . CHOLECYSTECTOMY  1990  . COLON SURGERY    . COLOSTOMY REVISION  12/20/12   Procedure: COLON RESECTION SIGMOID;  Surgeon: Gwenyth Ober, MD;  Location: [facility];  Service: General;  Laterality: N/A;  . CYSTOSCOPY WITH STENT PLACEMENT  12/20/12   Procedure: CYSTOSCOPY WITH STENT PLACEMENT;  Surgeon: Hanley Ben, MD;  Location: [facility];  Service: Urology;  Laterality: N/A;  . DILATION AND CURETTAGE OF UTERUS  1960's   "lots of them; had miscarriages" (12-20-2012)  . LYSIS OF ADHESION N/A 12/02/2015   Procedure: LAPAROSCOPIC LYSIS OF ADHESION;  Surgeon: Rodman Key  Hassell Done, MD;  Location: [facility];  Service: General;  Laterality: N/A;  . ROBOTIC ASSISTED BILATERAL SALPINGO OOPHERECTOMY Bilateral 12/02/2015   Procedure: XI ROBOTIC ASSISTED BILATERAL SALPINGO OOPHORECTOMY;  Surgeon: Everitt Amber, MD;  Location: [facility];  Service: Gynecology;  Laterality: Bilateral;  . SIGMOID RESECTION / RECTOPEXY  12/19/2012  . THYROIDECTOMY, PARTIAL  1988   "then did iodine to remove the rest" (12/19/2012)  . TONSILLECTOMY  1951?  . TRANSRECTAL DRAINAGE OF PELVIC ABSCESS  10/27/2012  . VAGINAL HYSTERECTOMY  1970's   "still have my ovaries" (12/19/2012)    Family History  Problem Relation Age of Onset  . Heart disease Father   . Pneumonia Father   . Hypertension Father   . Hyperlipidemia Father   . Cancer Father        skin  . Stroke Father   . Alzheimer's disease Mother   . Heart  disease Mother   . Depression Mother   . Emphysema Brother        marijuana and cigarettes  . Alcohol abuse Brother   . Hearing loss Brother   . Diabetes Maternal Grandmother   . Alzheimer's disease Paternal Grandmother   . Cancer Paternal Grandmother        lung?- smoker  . Hyperlipidemia Paternal Grandmother   . Heart attack Paternal Grandfather   . Alcohol abuse Paternal Grandfather   . Neurofibromatosis Son        schwanomatosis  . Neurofibromatosis Son        swanomatosis  . Cancer Paternal Aunt     Social History   Socioeconomic History  . Marital status: Married    Spouse name: [name]  . Number of children: 2  . Years of education: Not on file  . Highest education level: Not on file  Occupational History  . Occupation: Retired  Scientific laboratory technician  . Financial resource strain: Not on file  . Food insecurity:    Worry: Not on file    Inability: Not on file  . Transportation needs:    Medical: Not on file    Non-medical: Not on file  Tobacco Use  . Smoking status: Never Smoker  . Smokeless tobacco: Never Used  Substance and Sexual Activity  . Alcohol use: No    Alcohol/week: 0.0 standard drinks  . Drug use: No  . Sexual activity: Not Currently  Lifestyle  . Physical activity:    Days per week: Not on file    Minutes per session: Not on file  . Stress: Not on file  Relationships  . Social connections:    Talks on phone: Not on file    Gets together: Not on file    Attends religious service: Not on file    Active member of club or organization: Not on file    Attends meetings of clubs or organizations: Not on file    Relationship status: Not on file  . Intimate partner violence:    Fear of current or ex partner: Not on file    Emotionally abused: Not on file    Physically abused: Not on file    Forced sexual activity: Not on file  Other Topics Concern  . Not on file  Social History Narrative   Married    children    Outpatient Medications Prior to  Visit  Medication Sig Dispense Refill  . amLODipine (NORVASC) 2.5 MG tablet Take 2.5 mg by mouth daily.    Marland Kitchen docusate sodium (COLACE) 100 MG capsule Take 100  mg by mouth 2 (two) times daily. Reported on 03/30/2016    . fluticasone (FLONASE) 50 MCG/ACT nasal spray Place 2 sprays into both nostrils daily. 48 g 3  . gabapentin (NEURONTIN) 100 MG capsule TAKE 2 CAPSULES BY MOUTH EVERY DAY AT BEDTIME 180 capsule 0  . hydrochlorothiazide (HYDRODIURIL) 25 MG tablet TAKE 1 TABLET BY MOUTH EVERY DAY 90 tablet 1  . KETOCONAZOLE, TOPICAL, 1 % SHAM Apply 1 Dose topically once a week. 200 mL 1  . KRILL OIL PO Take 750 mg by mouth daily.     Marland Kitchen levothyroxine (SYNTHROID, LEVOTHROID) 125 MCG tablet Take 1 tablet (125 mcg total) by mouth daily. 90 tablet 0  . losartan (COZAAR) 100 MG tablet TAKE 1 TABLET BY MOUTH EVERY DAY 90 tablet 1  . Multiple Vitamins-Minerals (MULTIVITAMIN WOMEN 50+ PO) Take 1 tablet by mouth daily.    . naproxen (NAPROSYN) 375 MG tablet TAKE 1 TABLET (375 MG TOTAL) BY MOUTH 2 (TWO) TIMES DAILY WITH A MEAL. 180 tablet 1  . NITROSTAT 0.4 MG SL tablet PLACE 1 TABLET UNDER THE TONGUE EVERY 5 MINUTES AS NEEDED FOR CHEST PAIN 25 tablet 0  . omeprazole (PRILOSEC) 40 MG capsule TAKE 1 CAPSULE BY MOUTH TWICE A DAY 180 capsule 1  . Polyethyl Glycol-Propyl Glycol 0.4-0.3 % GEL Apply 1 drop to eye 2 (two) times daily as needed (dry eyes).     . polyethylene glycol (MIRALAX / GLYCOLAX) packet Take 17 g by mouth daily.    . Probiotic Product (PROBIOTIC ADVANCED PO) Take 2 tablets by mouth at bedtime. Digestive Advantage    . psyllium (REGULOID) 0.52 G capsule Take 0.52 g by mouth at bedtime.    . ranitidine (ZANTAC) 300 MG tablet TAKE 1 TABLET BY MOUTH EVERYDAY AT BEDTIME 90 tablet 1  . simvastatin (ZOCOR) 40 MG tablet TAKE 1 TABLET BY MOUTH EVERYDAY AT BEDTIME 90 tablet 0  . sodium chloride (OCEAN) 0.65 % SOLN nasal spray Place 1 spray into both nostrils 2 (two) times daily as needed for congestion.       . TURMERIC PO Take 1 tablet by mouth daily. Reported on 12/19/2015    . vitamin B-12 (CYANOCOBALAMIN) 1000 MCG tablet Take 100 mcg by mouth every other day.    . Vitamin D, Cholecalciferol, 1000 UNITS CAPS Take 1 capsule by mouth daily.     No facility-administered medications prior to visit.     Allergies  Allergen Reactions  . Neomycin-Bacitracin Zn-Polymyx Rash    Polysporin- is tolerated   . Niacin Other (See Comments) and Cough    "cough til I threw up" (12/19/2012)  . Ciprofloxacin Hives    Got cipro and flagyl at same time, localized hives to IV arm  . Flagyl [Metronidazole] Hives    Got cipro and flagyl at same time, localized hives to IV arm    Review of Systems  Constitutional: Negative for fever and malaise/fatigue.  HENT: Positive for hearing loss. Negative for congestion.   Eyes: Negative for blurred vision.  Respiratory: Negative for shortness of breath.   Cardiovascular: Negative for chest pain, palpitations and leg swelling.  Gastrointestinal: Positive for heartburn. Negative for abdominal pain, blood in stool and nausea.  Genitourinary: Negative for dysuria and frequency.  Musculoskeletal: Negative for falls.  Skin: Negative for rash.  Neurological: Positive for dizziness. Negative for loss of consciousness and headaches.  Endo/Heme/Allergies: Negative for environmental allergies.  Psychiatric/Behavioral: Negative for depression. The patient is not nervous/anxious.  Objective:    Physical Exam  Constitutional: She is oriented to person, place, and time. She appears well-developed and well-nourished. No distress.  HENT:  Head: Normocephalic and atraumatic.  Eyes: Conjunctivae are normal.  Neck: Neck supple. No thyromegaly present.  Cardiovascular: Normal rate, regular rhythm and normal heart sounds.  No murmur heard. Pulmonary/Chest: Effort normal and breath sounds normal. No respiratory distress.  Abdominal: Soft. Bowel sounds are normal. She  exhibits no distension and no mass. There is no tenderness.  Musculoskeletal: She exhibits no edema.  Lymphadenopathy:    She has no cervical adenopathy.  Neurological: She is alert and oriented to person, place, and time.  Skin: Skin is warm and dry.  Psychiatric: She has a normal mood and affect. Her behavior is normal.    BP (!) 144/74 (BP Location: Right Arm, Patient Position: Sitting, Cuff Size: Small)   Pulse 67   Temp 97.6 F (36.4 C) (Oral)   Resp 16   Ht 5\' 1"  (1.549 m)   Wt 168 lb (76.2 kg)   SpO2 100%   BMI 31.74 kg/m  Wt Readings from Last 3 Encounters:  09/04/18 168 lb (76.2 kg)  08/31/18 170 lb 3.2 oz (77.2 kg)  08/22/18 169 lb (76.7 kg)     Lab Results  Component Value Date   WBC 6.4 02/24/2018   HGB 12.3 02/24/2018   HCT 36.5 02/24/2018   PLT 281.0 02/24/2018   GLUCOSE 92 07/04/2018   CHOL 141 12/14/2017   TRIG 107.0 12/14/2017   HDL 46.60 12/14/2017   LDLCALC 73 12/14/2017   ALT 17 07/04/2018   AST 14 07/04/2018   NA 137 07/04/2018   K 4.3 07/04/2018   CL 96 07/04/2018   CREATININE 0.73 07/04/2018   BUN 18 07/04/2018   CO2 27 07/04/2018   TSH 1.570 07/04/2018   INR 0.99 03/16/2015   HGBA1C 5.6 07/04/2018    Lab Results  Component Value Date   TSH 1.570 07/04/2018   Lab Results  Component Value Date   WBC 6.4 02/24/2018   HGB 12.3 02/24/2018   HCT 36.5 02/24/2018   MCV 87.1 02/24/2018   PLT 281.0 02/24/2018   Lab Results  Component Value Date   NA 137 07/04/2018   K 4.3 07/04/2018   CO2 27 07/04/2018   GLUCOSE 92 07/04/2018   BUN 18 07/04/2018   CREATININE 0.73 07/04/2018   BILITOT 0.3 07/04/2018   ALKPHOS 89 07/04/2018   AST 14 07/04/2018   ALT 17 07/04/2018   PROT 6.3 07/04/2018   ALBUMIN 4.2 07/04/2018   CALCIUM 8.9 07/04/2018   ANIONGAP 6 12/03/2015   GFR 88.51 02/24/2018   Lab Results  Component Value Date   CHOL 141 12/14/2017   Lab Results  Component Value Date   HDL 46.60 12/14/2017   Lab Results   Component Value Date   LDLCALC 73 12/14/2017   Lab Results  Component Value Date   TRIG 107.0 12/14/2017   Lab Results  Component Value Date   CHOLHDL 3 12/14/2017   Lab Results  Component Value Date   HGBA1C 5.6 07/04/2018       Assessment & Plan:   Problem List Items Addressed This Visit    Hyperlipidemia, mixed    Encouraged heart healthy diet, increase exercise, avoid trans fats, consider a krill oil cap daily      Essential hypertension    Has just restarted the Amlodipine this week and blood pressure is still running mildly high, no  changes for now      G E R D    Avoid offending foods, start probiotics. Do not eat large meals in late evening and consider raising head of bed.       Relevant Medications   hyoscyamine (LEVSIN SL) 0.125 MG SL tablet    Other Visit Diagnoses    Right shoulder pain, unspecified chronicity    -  Primary   Relevant Orders   Ambulatory referral to Orthopedic Surgery      I am having Julitza P. Mcdade start on hyoscyamine. I am also having her maintain her psyllium, KRILL OIL PO, TURMERIC PO, Polyethyl Glycol-Propyl Glycol, sodium chloride, Probiotic Product (PROBIOTIC ADVANCED PO), Vitamin D (Cholecalciferol), docusate sodium, NITROSTAT, polyethylene glycol, fluticasone, levothyroxine, Multiple Vitamins-Minerals (MULTIVITAMIN WOMEN 50+ PO), vitamin B-12, KETOCONAZOLE (TOPICAL), omeprazole, ranitidine, hydrochlorothiazide, losartan, gabapentin, naproxen, simvastatin, and amLODipine.  Meds ordered this encounter  Medications  . hyoscyamine (LEVSIN SL) 0.125 MG SL tablet    Sig: Place 1 tablet (0.125 mg total) under the tongue every 4 (four) hours as needed.    Dispense:  30 tablet    Refill:  1     Penni Homans, MD

## 2018-09-06 NOTE — Assessment & Plan Note (Signed)
Encouraged heart healthy diet, increase exercise, avoid trans fats, consider a krill oil cap daily 

## 2018-09-06 NOTE — Assessment & Plan Note (Signed)
Avoid offending foods, start probiotics. Do not eat large meals in late evening and consider raising head of bed.  

## 2018-09-07 DIAGNOSIS — L299 Pruritus, unspecified: Secondary | ICD-10-CM | POA: Diagnosis not present

## 2018-09-12 ENCOUNTER — Ambulatory Visit (INDEPENDENT_AMBULATORY_CARE_PROVIDER_SITE_OTHER): Payer: PPO | Admitting: Family Medicine

## 2018-09-12 VITALS — BP 128/72 | HR 74 | Temp 98.3°F | Ht 61.0 in | Wt 165.0 lb

## 2018-09-12 DIAGNOSIS — E669 Obesity, unspecified: Secondary | ICD-10-CM | POA: Diagnosis not present

## 2018-09-12 DIAGNOSIS — E559 Vitamin D deficiency, unspecified: Secondary | ICD-10-CM

## 2018-09-12 DIAGNOSIS — Z6831 Body mass index (BMI) 31.0-31.9, adult: Secondary | ICD-10-CM

## 2018-09-13 NOTE — Progress Notes (Signed)
Office: [phone]  /  Fax: [phone]   HPI:   Chief Complaint: OBESITY Jackie Stevens is here to discuss her progress with her obesity treatment plan. She is on the Category 2 plan and is following her eating plan approximately 75 % of the time. She states she is exercising 0 minutes 0 times per week. Jackie Stevens has done well with weight loss on her Category 2 plan. Hunger is controlled, but she has increased eating out. She has tried to be mindful, but she is still indulging in sweets frequently. Her weight is 165 lb (74.8 kg) today and has had a weight loss of 4 pounds over a period of 3 weeks since her last visit. She has lost 39 lbs since starting treatment with [place].  Vitamin D deficiency Jackie Stevens has a diagnosis of vitamin D deficiency. Jackie Stevens is currently taking OTC vit D 1,000 IU daily and her fatigue is improving. She denies nausea, vomiting or muscle weakness.  ALLERGIES: Allergies  Allergen Reactions  . Neomycin-Bacitracin Zn-Polymyx Rash    Polysporin- is tolerated   . Niacin Other (See Comments) and Cough    "cough til I threw up" (12/19/2012)  . Ciprofloxacin Hives    Got cipro and flagyl at same time, localized hives to IV arm  . Flagyl [Metronidazole] Hives    Got cipro and flagyl at same time, localized hives to IV arm    MEDICATIONS: Current Outpatient Medications on File Prior to Visit  Medication Sig Dispense Refill  . amLODipine (NORVASC) 2.5 MG tablet Take 2.5 mg by mouth daily.    Marland Kitchen docusate sodium (COLACE) 100 MG capsule Take 100 mg by mouth 2 (two) times daily. Reported on 03/30/2016    . fluticasone (FLONASE) 50 MCG/ACT nasal spray Place 2 sprays into both nostrils daily. 48 g 3  . gabapentin (NEURONTIN) 100 MG capsule TAKE 2 CAPSULES BY MOUTH EVERY DAY AT BEDTIME 180 capsule 0  . hydrochlorothiazide (HYDRODIURIL) 25 MG tablet TAKE 1 TABLET BY MOUTH EVERY DAY 90 tablet 1  . hyoscyamine (LEVSIN SL) 0.125 MG SL tablet Place 1 tablet (0.125 mg total) under the tongue  every 4 (four) hours as needed. 30 tablet 1  . KETOCONAZOLE, TOPICAL, 1 % SHAM Apply 1 Dose topically once a week. 200 mL 1  . KRILL OIL PO Take 750 mg by mouth daily.     Marland Kitchen levothyroxine (SYNTHROID, LEVOTHROID) 125 MCG tablet Take 1 tablet (125 mcg total) by mouth daily. 90 tablet 0  . losartan (COZAAR) 100 MG tablet TAKE 1 TABLET BY MOUTH EVERY DAY 90 tablet 1  . Multiple Vitamins-Minerals (MULTIVITAMIN WOMEN 50+ PO) Take 1 tablet by mouth daily.    . naproxen (NAPROSYN) 375 MG tablet TAKE 1 TABLET (375 MG TOTAL) BY MOUTH 2 (TWO) TIMES DAILY WITH A MEAL. 180 tablet 1  . NITROSTAT 0.4 MG SL tablet PLACE 1 TABLET UNDER THE TONGUE EVERY 5 MINUTES AS NEEDED FOR CHEST PAIN 25 tablet 0  . omeprazole (PRILOSEC) 40 MG capsule TAKE 1 CAPSULE BY MOUTH TWICE A DAY 180 capsule 1  . Polyethyl Glycol-Propyl Glycol 0.4-0.3 % GEL Apply 1 drop to eye 2 (two) times daily as needed (dry eyes).     . polyethylene glycol (MIRALAX / GLYCOLAX) packet Take 17 g by mouth daily.    . Probiotic Product (PROBIOTIC ADVANCED PO) Take 2 tablets by mouth at bedtime. Digestive Advantage    . psyllium (REGULOID) 0.52 G capsule Take 0.52 g by mouth at bedtime.    Marland Kitchen  ranitidine (ZANTAC) 300 MG tablet TAKE 1 TABLET BY MOUTH EVERYDAY AT BEDTIME 90 tablet 1  . simvastatin (ZOCOR) 40 MG tablet TAKE 1 TABLET BY MOUTH EVERYDAY AT BEDTIME 90 tablet 0  . sodium chloride (OCEAN) 0.65 % SOLN nasal spray Place 1 spray into both nostrils 2 (two) times daily as needed for congestion.     . TURMERIC PO Take 1 tablet by mouth daily. Reported on 12/19/2015    . vitamin B-12 (CYANOCOBALAMIN) 1000 MCG tablet Take 100 mcg by mouth every other day.    . Vitamin D, Cholecalciferol, 1000 UNITS CAPS Take 1 capsule by mouth daily.     No current facility-administered medications on file prior to visit.     PAST MEDICAL HISTORY: Past Medical History:  Diagnosis Date  . Abnormal cervical cytology 10/25/2012   Follows with Dr Leavy Cella of Gyn  .  Allergic rhinitis   . Anemia 10/06/2013  . Anginal pain ([place])    pt has history of CP states had cardiac workup with no specific issues identified   . Anxiety    when increased stress   . Arthritis    "knees; right thumb; shoulders" (Dec 27, 2012)  . Asthma   . Chest pain   . Chicken pox as a child  . Complication of anesthesia   . Constipation   . Decreased hearing   . Dermatitis 03/06/2017  . Diverticulitis 10/25/2012   pt. reports that a drain was placed - 09/2012    . Dry mouth   . Excessive thirst   . External hemorrhoid, bleeding    "sometimes" (2012/12/27)  . Frequent urination   . GERD (gastroesophageal reflux disease)   . H/O hiatal hernia   . Hand tingling   . Heart murmur   . HTN (hypertension)    stress test completed by Anselm Lis, diagnosed as GERD  . Hyperlipidemia   . Hypothyroidism   . Incontinence   . Infertility, female   . Insomnia   . Joint pain   . Kidney stones 1970's   "passed on their own" (December 27, 2012)  . Knee pain   . Left shoulder pain 09/14/2016  . Low back pain 06/09/2014  . Measles as a child  . Medicare annual wellness visit, subsequent 10/06/2013   Steinhoffer of Dermatology Pneumovax in 2012   . Memory difficulties 09/05/2017  . Obesity 09/11/2017  . OSA on CPAP   . Palpitations   . Paresthesia 03/23/2015   Left face  . PONV (postoperative nausea and vomiting)   . Preventative health care 10/06/2013   [facility] of Dermatology Pneumovax in 2012  . Preventative health care 02/26/2016  . Rheumatoid arteritis   . Shortness of breath dyspnea    using stairs  . Sinus pain   . Sleep apnea   . Swelling of both lower extremities   . Thyroid cancer ([place]) 1980's  . Thyroid disease   . Tinnitus   . UTI (urinary tract infection) 04/02/2013    PAST SURGICAL HISTORY: Past Surgical History:  Procedure Laterality Date  . CHOLECYSTECTOMY  1990  . COLON SURGERY    . COLOSTOMY REVISION  December 27, 2012   Procedure: COLON RESECTION SIGMOID;  Surgeon:  Gwenyth Ober, MD;  Location: [facility];  Service: General;  Laterality: N/A;  . CYSTOSCOPY WITH STENT PLACEMENT  27-Dec-2012   Procedure: CYSTOSCOPY WITH STENT PLACEMENT;  Surgeon: Hanley Ben, MD;  Location: [facility];  Service: Urology;  Laterality: N/A;  . DILATION AND CURETTAGE OF UTERUS  1960's   "  lots of them; had miscarriages" (12/19/2012)  . LYSIS OF ADHESION N/A 12/02/2015   Procedure: LAPAROSCOPIC LYSIS OF ADHESION;  Surgeon: Johnathan Hausen, MD;  Location: [facility];  Service: General;  Laterality: N/A;  . ROBOTIC ASSISTED BILATERAL SALPINGO OOPHERECTOMY Bilateral 12/02/2015   Procedure: XI ROBOTIC ASSISTED BILATERAL SALPINGO OOPHORECTOMY;  Surgeon: Everitt Amber, MD;  Location: [facility];  Service: Gynecology;  Laterality: Bilateral;  . SIGMOID RESECTION / RECTOPEXY  12/19/2012  . THYROIDECTOMY, PARTIAL  1988   "then did iodine to remove the rest" (12/19/2012)  . TONSILLECTOMY  1951?  . TRANSRECTAL DRAINAGE OF PELVIC ABSCESS  10/27/2012  . VAGINAL HYSTERECTOMY  1970's   "still have Jackie Stevens ovaries" (12/19/2012)    SOCIAL HISTORY: Social History   Tobacco Use  . Smoking status: Never Smoker  . Smokeless tobacco: Never Used  Substance Use Topics  . Alcohol use: No    Alcohol/week: 0.0 standard drinks  . Drug use: No    FAMILY HISTORY: Family History  Problem Relation Age of Onset  . Heart disease Father   . Pneumonia Father   . Hypertension Father   . Hyperlipidemia Father   . Cancer Father        skin  . Stroke Father   . Alzheimer's disease Mother   . Heart disease Mother   . Depression Mother   . Emphysema Brother        marijuana and cigarettes  . Alcohol abuse Brother   . Hearing loss Brother   . Diabetes Maternal Grandmother   . Alzheimer's disease Paternal Grandmother   . Cancer Paternal Grandmother        lung?- smoker  . Hyperlipidemia Paternal Grandmother   . Heart attack Paternal Grandfather   . Alcohol abuse Paternal Grandfather   . Neurofibromatosis Son         schwanomatosis  . Neurofibromatosis Son        swanomatosis  . Cancer Paternal Aunt     ROS: Review of Systems  Constitutional: Positive for malaise/fatigue and weight loss.  Gastrointestinal: Negative for nausea and vomiting.  Musculoskeletal:       Negative for muscle weakness    PHYSICAL EXAM: Blood pressure 128/72, pulse 74, temperature 98.3 F (36.8 C), temperature source Oral, height 5\' 1"  (1.549 m), weight 165 lb (74.8 kg), SpO2 97 %. Body mass index is 31.18 kg/m. Physical Exam  Constitutional: She is oriented to person, place, and time. She appears well-developed and well-nourished.  Cardiovascular: Normal rate.  Pulmonary/Chest: Effort normal.  Musculoskeletal: Normal range of motion.  Neurological: She is oriented to person, place, and time.  Skin: Skin is warm and dry.  Psychiatric: She has a normal mood and affect. Her behavior is normal.  Vitals reviewed.   RECENT LABS AND TESTS: BMET    Component Value Date/Time   NA 137 07/04/2018 1035   K 4.3 07/04/2018 1035   CL 96 07/04/2018 1035   CO2 27 07/04/2018 1035   GLUCOSE 92 07/04/2018 1035   GLUCOSE 102 (H) 02/24/2018 0829   BUN 18 07/04/2018 1035   CREATININE 0.73 07/04/2018 1035   CREATININE 0.75 05/28/2014 0836   CALCIUM 8.9 07/04/2018 1035   GFRNONAA 82 07/04/2018 1035   GFRAA 94 07/04/2018 1035   Lab Results  Component Value Date   HGBA1C 5.6 07/04/2018   HGBA1C 5.8 (H) 07/18/2011   Lab Results  Component Value Date   INSULIN 6.4 07/04/2018   INSULIN 7.9 02/28/2018   CBC    Component  Value Date/Time   WBC 6.4 02/24/2018 0829   RBC 4.19 02/24/2018 0829   HGB 12.3 02/24/2018 0829   HCT 36.5 02/24/2018 0829   PLT 281.0 02/24/2018 0829   MCV 87.1 02/24/2018 0829   MCH 28.7 12/03/2015 0522   MCHC 33.6 02/24/2018 0829   RDW 13.7 02/24/2018 0829   LYMPHSABS 1.5 12/14/2017 0935   MONOABS 0.5 12/14/2017 0935   EOSABS 0.4 12/14/2017 0935   BASOSABS 0.0 12/14/2017 0935    Iron/TIBC/Ferritin/ %Sat    Component Value Date/Time   IRON 33 09/15/2015 0618   TIBC 249 (L) 09/15/2015 0618   FERRITIN 52 09/15/2015 0618   IRONPCTSAT 13 09/15/2015 0618   IRONPCTSAT 22 07/02/2013 1056   Lipid Panel     Component Value Date/Time   CHOL 141 12/14/2017 0935   TRIG 107.0 12/14/2017 0935   HDL 46.60 12/14/2017 0935   CHOLHDL 3 12/14/2017 0935   VLDL 21.4 12/14/2017 0935   LDLCALC 73 12/14/2017 0935   Hepatic Function Panel     Component Value Date/Time   PROT 6.3 07/04/2018 1035   ALBUMIN 4.2 07/04/2018 1035   AST 14 07/04/2018 1035   ALT 17 07/04/2018 1035   ALKPHOS 89 07/04/2018 1035   BILITOT 0.3 07/04/2018 1035   BILIDIR 0.0 11/01/2014 1116   IBILI 0.3 05/28/2014 0836      Component Value Date/Time   TSH 1.570 07/04/2018 1035   TSH 0.49 02/24/2018 0829   TSH 0.40 12/14/2017 0935   Results for KEIAIRA, DONLAN (MRN [id number]) as of 09/13/2018 08:28  Ref. Range 08/07/2018 14:32  Vitamin D, 25-Hydroxy Latest Ref Range: 30.0 - 100.0 ng/mL 61.1   ASSESSMENT AND PLAN: Vitamin D deficiency  Class 1 obesity with serious comorbidity and body mass index (BMI) of 31.0 to 31.9 in adult, unspecified obesity type  PLAN:  Vitamin D Deficiency Jackie Stevens was informed that low vitamin D levels contributes to fatigue and are associated with obesity, breast, and colon cancer. She agrees to continue to take OTC Vit D @1 ,000 IU daily and will follow up for routine testing of vitamin D, at least 2-3 times per year. She was informed of the risk of over-replacement of vitamin D and agrees to not increase her dose unless she discusses this with [place] first. We will plan to recheck labs in 1 month and Jackie Stevens will follow up at the agreed upon time.  I spent > than 50% of the 15 minute visit on counseling as documented in the note.  Obesity Jackie Stevens is currently in the action stage of change. As such, her goal is to continue with weight loss efforts She has agreed to keep a  food journal with 350 to 500 calories and 35 grams of protein at supper daily and follow the Category 2 plan Jackie Stevens has been instructed to work up to a goal of 150 minutes of combined cardio and strengthening exercise per week for weight loss and overall health benefits. We discussed the following Behavioral Modification Strategies today: increasing lean protein intake, decreasing simple carbohydrates , decrease eating out and work on meal planning and easy cooking plans  Jackie Stevens has agreed to follow up with our clinic in 4 to 5 weeks. She was informed of the importance of frequent follow up visits to maximize her success with intensive lifestyle modifications for her multiple health conditions.   OBESITY BEHAVIORAL INTERVENTION VISIT  Today's visit was # 11   Starting weight: 204 lbs Starting date: 02/28/18 Today's weight : 165  lbs  Today's date: 09/12/2018 Total lbs lost to date: 43   ASK: We discussed the diagnosis of obesity with Jackie Stevens today and Jackie Stevens agreed to give [place] permission to discuss obesity behavioral modification therapy today.  ASSESS: Jackie Stevens has the diagnosis of obesity and her BMI today is 31.19 Jackie Stevens is in the action stage of change   ADVISE: Jackie Stevens was educated on the multiple health risks of obesity as well as the benefit of weight loss to improve her health. She was advised of the need for long term treatment and the importance of lifestyle modifications to improve her current health and to decrease her risk of future health problems.  AGREE: Multiple dietary modification options and treatment options were discussed and  Jackie Stevens agreed to follow the recommendations documented in the above note.  ARRANGE: Jackie Stevens was educated on the importance of frequent visits to treat obesity as outlined per CMS and USPSTF guidelines and agreed to schedule her next follow up appointment today.  I, Doreene Nest, am acting as transcriptionist for Dennard Nip, MD  I  have reviewed the above documentation for accuracy and completeness, and I agree with the above. -Dennard Nip, MD

## 2018-09-15 ENCOUNTER — Encounter: Payer: Self-pay | Admitting: Family Medicine

## 2018-09-27 DIAGNOSIS — G4733 Obstructive sleep apnea (adult) (pediatric): Secondary | ICD-10-CM | POA: Diagnosis not present

## 2018-10-10 ENCOUNTER — Ambulatory Visit (INDEPENDENT_AMBULATORY_CARE_PROVIDER_SITE_OTHER): Payer: PPO | Admitting: Family Medicine

## 2018-10-10 VITALS — BP 124/73 | HR 74 | Temp 98.0°F | Ht 61.0 in | Wt 167.0 lb

## 2018-10-10 DIAGNOSIS — E669 Obesity, unspecified: Secondary | ICD-10-CM

## 2018-10-10 DIAGNOSIS — Z6831 Body mass index (BMI) 31.0-31.9, adult: Secondary | ICD-10-CM | POA: Diagnosis not present

## 2018-10-10 DIAGNOSIS — I10 Essential (primary) hypertension: Secondary | ICD-10-CM

## 2018-10-11 NOTE — Progress Notes (Signed)
Office: [phone]  /  Fax: [phone]   HPI:   Chief Complaint: OBESITY Jackie Stevens is here to discuss her progress with her obesity treatment plan. She is following the Category 2 plan and is following her eating plan approximately 60 % of the time. She states she is exercising 0 minutes 0 times per week. Jackie Stevens was on vacation and had increased celebration eating. She states that she is ready to get back on track with the Category 2 plan.   Her weight is 167 lb (75.8 kg) today, she had a weight gain of 2 lbs since her last visit. She has lost 39 lbs since starting treatment with [place].  Hypertension Jackie Stevens is a 72 y.o. female with hypertension.  Jackie Stevens denies chest pain or shortness of breath on exertion. She is working weight loss to help control her blood pressure with the goal of decreasing her risk of heart attack and stroke. Jackie Stevens blood pressure is controlled on medication even with increased Sodium in diet. She denies any chest pain or headaches.    ALLERGIES: Allergies  Allergen Reactions  . Neomycin-Bacitracin Zn-Polymyx Rash    Polysporin- is tolerated   . Niacin Other (See Comments) and Cough    "cough til I threw up" (12/19/2012)  . Ciprofloxacin Hives    Got cipro and flagyl at same time, localized hives to IV arm  . Flagyl [Metronidazole] Hives    Got cipro and flagyl at same time, localized hives to IV arm    MEDICATIONS: Current Outpatient Medications on File Prior to Visit  Medication Sig Dispense Refill  . amLODipine (NORVASC) 2.5 MG tablet Take 2.5 mg by mouth daily.    Marland Kitchen docusate sodium (COLACE) 100 MG capsule Take 100 mg by mouth 2 (two) times daily. Reported on 03/30/2016    . fluticasone (FLONASE) 50 MCG/ACT nasal spray Place 2 sprays into both nostrils daily. 48 g 3  . gabapentin (NEURONTIN) 100 MG capsule TAKE 2 CAPSULES BY MOUTH EVERY DAY AT BEDTIME 180 capsule 0  . hydrochlorothiazide (HYDRODIURIL) 25 MG tablet TAKE 1 TABLET BY MOUTH EVERY  DAY 90 tablet 1  . hyoscyamine (LEVSIN SL) 0.125 MG SL tablet Place 1 tablet (0.125 mg total) under the tongue every 4 (four) hours as needed. 30 tablet 1  . KETOCONAZOLE, TOPICAL, 1 % SHAM Apply 1 Dose topically once a week. 200 mL 1  . KRILL OIL PO Take 750 mg by mouth daily.     Marland Kitchen levothyroxine (SYNTHROID, LEVOTHROID) 125 MCG tablet Take 1 tablet (125 mcg total) by mouth daily. 90 tablet 0  . losartan (COZAAR) 100 MG tablet TAKE 1 TABLET BY MOUTH EVERY DAY 90 tablet 1  . Multiple Vitamins-Minerals (MULTIVITAMIN WOMEN 50+ PO) Take 1 tablet by mouth daily.    . naproxen (NAPROSYN) 375 MG tablet TAKE 1 TABLET (375 MG TOTAL) BY MOUTH 2 (TWO) TIMES DAILY WITH A MEAL. 180 tablet 1  . NITROSTAT 0.4 MG SL tablet PLACE 1 TABLET UNDER THE TONGUE EVERY 5 MINUTES AS NEEDED FOR CHEST PAIN 25 tablet 0  . omeprazole (PRILOSEC) 40 MG capsule TAKE 1 CAPSULE BY MOUTH TWICE A DAY 180 capsule 1  . Polyethyl Glycol-Propyl Glycol 0.4-0.3 % GEL Apply 1 drop to eye 2 (two) times daily as needed (dry eyes).     . polyethylene glycol (MIRALAX / GLYCOLAX) packet Take 17 g by mouth daily.    . Probiotic Product (PROBIOTIC ADVANCED PO) Take 2 tablets by mouth at bedtime.  Digestive Advantage    . psyllium (REGULOID) 0.52 G capsule Take 0.52 g by mouth at bedtime.    . ranitidine (ZANTAC) 300 MG tablet TAKE 1 TABLET BY MOUTH EVERYDAY AT BEDTIME 90 tablet 1  . simvastatin (ZOCOR) 40 MG tablet TAKE 1 TABLET BY MOUTH EVERYDAY AT BEDTIME 90 tablet 0  . sodium chloride (OCEAN) 0.65 % SOLN nasal spray Place 1 spray into both nostrils 2 (two) times daily as needed for congestion.     . TURMERIC PO Take 1 tablet by mouth daily. Reported on 12/19/2015    . vitamin B-12 (CYANOCOBALAMIN) 1000 MCG tablet Take 100 mcg by mouth every other day.    . Vitamin D, Cholecalciferol, 1000 UNITS CAPS Take 1 capsule by mouth daily.     No current facility-administered medications on file prior to visit.     PAST MEDICAL HISTORY: Past Medical  History:  Diagnosis Date  . Abnormal cervical cytology 10/25/2012   Follows with Dr Leavy Cella of Gyn  . Allergic rhinitis   . Anemia 10/06/2013  . Anginal pain ([place])    pt has history of CP states had cardiac workup with no specific issues identified   . Anxiety    when increased stress   . Arthritis    "knees; right thumb; shoulders" (01/13/13)  . Asthma   . Chest pain   . Chicken pox as a child  . Complication of anesthesia   . Constipation   . Decreased hearing   . Dermatitis 03/06/2017  . Diverticulitis 10/25/2012   pt. reports that a drain was placed - 09/2012    . Dry mouth   . Excessive thirst   . External hemorrhoid, bleeding    "sometimes" (01/13/13)  . Frequent urination   . GERD (gastroesophageal reflux disease)   . H/O hiatal hernia   . Hand tingling   . Heart murmur   . HTN (hypertension)    stress test completed by Anselm Lis, diagnosed as GERD  . Hyperlipidemia   . Hypothyroidism   . Incontinence   . Infertility, female   . Insomnia   . Joint pain   . Kidney stones 1970's   "passed on their own" (2013-01-13)  . Knee pain   . Left shoulder pain 09/14/2016  . Low back pain 06/09/2014  . Measles as a child  . Medicare annual wellness visit, subsequent 10/06/2013   Steinhoffer of Dermatology Pneumovax in 2012   . Memory difficulties 09/05/2017  . Obesity 09/11/2017  . OSA on CPAP   . Palpitations   . Paresthesia 03/23/2015   Left face  . PONV (postoperative nausea and vomiting)   . Preventative health care 10/06/2013   [facility]ffer of Dermatology Pneumovax in 2012  . Preventative health care 02/26/2016  . Rheumatoid arteritis   . Shortness of breath dyspnea    using stairs  . Sinus pain   . Sleep apnea   . Swelling of both lower extremities   . Thyroid cancer ([place]) 1980's  . Thyroid disease   . Tinnitus   . UTI (urinary tract infection) 04/02/2013    PAST SURGICAL HISTORY: Past Surgical History:  Procedure Laterality Date  .  CHOLECYSTECTOMY  1990  . COLON SURGERY    . COLOSTOMY REVISION  13-Jan-2013   Procedure: COLON RESECTION SIGMOID;  Surgeon: Gwenyth Ober, MD;  Location: [facility];  Service: General;  Laterality: N/A;  . CYSTOSCOPY WITH STENT PLACEMENT  Jan 13, 2013   Procedure: CYSTOSCOPY WITH STENT PLACEMENT;  Surgeon:  Hanley Ben, MD;  Location: [facility];  Service: Urology;  Laterality: N/A;  . DILATION AND CURETTAGE OF UTERUS  1960's   "lots of them; had miscarriages" (12/19/2012)  . LYSIS OF ADHESION N/A 12/02/2015   Procedure: LAPAROSCOPIC LYSIS OF ADHESION;  Surgeon: Johnathan Hausen, MD;  Location: [facility];  Service: General;  Laterality: N/A;  . ROBOTIC ASSISTED BILATERAL SALPINGO OOPHERECTOMY Bilateral 12/02/2015   Procedure: XI ROBOTIC ASSISTED BILATERAL SALPINGO OOPHORECTOMY;  Surgeon: Everitt Amber, MD;  Location: [facility];  Service: Gynecology;  Laterality: Bilateral;  . SIGMOID RESECTION / RECTOPEXY  12/19/2012  . THYROIDECTOMY, PARTIAL  1988   "then did iodine to remove the rest" (12/19/2012)  . TONSILLECTOMY  1951?  . TRANSRECTAL DRAINAGE OF PELVIC ABSCESS  10/27/2012  . VAGINAL HYSTERECTOMY  1970's   "still have my ovaries" (12/19/2012)    SOCIAL HISTORY: Social History   Tobacco Use  . Smoking status: Never Smoker  . Smokeless tobacco: Never Used  Substance Use Topics  . Alcohol use: No    Alcohol/week: 0.0 standard drinks  . Drug use: No    FAMILY HISTORY: Family History  Problem Relation Age of Onset  . Heart disease Father   . Pneumonia Father   . Hypertension Father   . Hyperlipidemia Father   . Cancer Father        skin  . Stroke Father   . Alzheimer's disease Mother   . Heart disease Mother   . Depression Mother   . Emphysema Brother        marijuana and cigarettes  . Alcohol abuse Brother   . Hearing loss Brother   . Diabetes Maternal Grandmother   . Alzheimer's disease Paternal Grandmother   . Cancer Paternal Grandmother        lung?- smoker  . Hyperlipidemia Paternal  Grandmother   . Heart attack Paternal Grandfather   . Alcohol abuse Paternal Grandfather   . Neurofibromatosis Son        schwanomatosis  . Neurofibromatosis Son        swanomatosis  . Cancer Paternal Aunt     ROS: Review of Systems  Constitutional: Negative for weight loss.  Cardiovascular: Negative for chest pain.  Neurological: Negative for headaches.    PHYSICAL EXAM: Blood pressure 124/73, pulse 74, temperature 98 F (36.7 C), temperature source Oral, height 5\' 1"  (1.549 m), weight 167 lb (75.8 kg), SpO2 98 %. Body mass index is 31.55 kg/m. Physical Exam  Constitutional: She is oriented to person, place, and time. She appears well-developed and well-nourished.  Cardiovascular: Normal rate.  Pulmonary/Chest: Effort normal.  Musculoskeletal: Normal range of motion.  Neurological: She is alert and oriented to person, place, and time.  Skin: Skin is warm and dry.  Psychiatric: She has a normal mood and affect. Her behavior is normal.  Vitals reviewed.   RECENT LABS AND TESTS: BMET    Component Value Date/Time   NA 137 07/04/2018 1035   K 4.3 07/04/2018 1035   CL 96 07/04/2018 1035   CO2 27 07/04/2018 1035   GLUCOSE 92 07/04/2018 1035   GLUCOSE 102 (H) 02/24/2018 0829   BUN 18 07/04/2018 1035   CREATININE 0.73 07/04/2018 1035   CREATININE 0.75 05/28/2014 0836   CALCIUM 8.9 07/04/2018 1035   GFRNONAA 82 07/04/2018 1035   GFRAA 94 07/04/2018 1035   Lab Results  Component Value Date   HGBA1C 5.6 07/04/2018   HGBA1C 5.8 (H) 07/18/2011   Lab Results  Component Value Date  INSULIN 6.4 07/04/2018   INSULIN 7.9 02/28/2018   CBC    Component Value Date/Time   WBC 6.4 02/24/2018 0829   RBC 4.19 02/24/2018 0829   HGB 12.3 02/24/2018 0829   HCT 36.5 02/24/2018 0829   PLT 281.0 02/24/2018 0829   MCV 87.1 02/24/2018 0829   MCH 28.7 12/03/2015 0522   MCHC 33.6 02/24/2018 0829   RDW 13.7 02/24/2018 0829   LYMPHSABS 1.5 12/14/2017 0935   MONOABS 0.5 12/14/2017  0935   EOSABS 0.4 12/14/2017 0935   BASOSABS 0.0 12/14/2017 0935   Iron/TIBC/Ferritin/ %Sat    Component Value Date/Time   IRON 33 09/15/2015 0618   TIBC 249 (L) 09/15/2015 0618   FERRITIN 52 09/15/2015 0618   IRONPCTSAT 13 09/15/2015 0618   IRONPCTSAT 22 07/02/2013 1056   Lipid Panel     Component Value Date/Time   CHOL 141 12/14/2017 0935   TRIG 107.0 12/14/2017 0935   HDL 46.60 12/14/2017 0935   CHOLHDL 3 12/14/2017 0935   VLDL 21.4 12/14/2017 0935   LDLCALC 73 12/14/2017 0935   Hepatic Function Panel     Component Value Date/Time   PROT 6.3 07/04/2018 1035   ALBUMIN 4.2 07/04/2018 1035   AST 14 07/04/2018 1035   ALT 17 07/04/2018 1035   ALKPHOS 89 07/04/2018 1035   BILITOT 0.3 07/04/2018 1035   BILIDIR 0.0 11/01/2014 1116   IBILI 0.3 05/28/2014 0836      Component Value Date/Time   TSH 1.570 07/04/2018 1035   TSH 0.49 02/24/2018 0829   TSH 0.40 12/14/2017 0935  Results for SHALONDA, SACHSE (MRN [id number]) as of 10/11/2018 14:10  Ref. Range 08/07/2018 14:32  Vitamin D, 25-Hydroxy Latest Ref Range: 30.0 - 100.0 ng/mL 61.1    ASSESSMENT AND PLAN: Essential hypertension  Class 1 obesity with serious comorbidity and body mass index (BMI) of 31.0 to 31.9 in adult, unspecified obesity type  PLAN: Hypertension We discussed sodium restriction, working on healthy weight loss, and a regular exercise program as the means to achieve improved blood pressure control. Jackie Stevens agreed with this plan and agreed to follow up as directed. We will continue to monitor her blood pressure as well as her progress with the above lifestyle modifications. She will continue her medications as prescribed and will watch for signs of hypotension as she continues her lifestyle modifications and gets back to her diet plan. Jackie Stevens has agreed to follow up in our office in 2 weeks.   Obesity Jackie Stevens is currently in the action stage of change. As such, her goal is to continue with weight loss  efforts She has agreed to follow the Category 2 plan Jackie Stevens has been instructed to work up to a goal of 150 minutes of combined cardio and strengthening exercise per week for weight loss and overall health benefits. We discussed the following Behavioral Modification Stratagies today: increasing lean protein intake and decreasing simple carbohydrates   Filicia has agreed to follow up with our clinic in 2 weeks. She was informed of the importance of frequent follow up visits to maximize her success with intensive lifestyle modifications for her multiple health conditions.  I spent > than 50% of the 15 minute visit on counseling as documented in the note.  OBESITY BEHAVIORAL INTERVENTION VISIT  Today's visit was # 12   Starting weight: 204 lbs Starting date: 02/28/2018 Today's weight : Weight: 167 lb (75.8 kg)  Today's date: 10/10/2018 Total lbs lost to date: 32   ASK: We discussed the  diagnosis of obesity with Jackie Stevens today and Jackie Stevens agreed to give [place] permission to discuss obesity behavioral modification therapy today.  ASSESS: Jackie Stevens has the diagnosis of obesity and her BMI today is 31.57 Jackie Stevens is in the action stage of change   ADVISE: Jackie Stevens was educated on the multiple health risks of obesity as well as the benefit of weight loss to improve her health. She was advised of the need for long term treatment and the importance of lifestyle modifications to improve her current health and to decrease her risk of future health problems.  AGREE: Multiple dietary modification options and treatment options were discussed and  Jackie Stevens agreed to follow the recommendations documented in the above note.  ARRANGE: Jackie Stevens was educated on the importance of frequent visits to treat obesity as outlined per CMS and USPSTF guidelines and agreed to schedule her next follow up appointment today.  I, Remi Deter, CMA, am acting as transcriptionist for Dennard Nip, MD  I have reviewed the  above documentation for accuracy and completeness, and I agree with the above. -Dennard Nip, MD

## 2018-10-16 ENCOUNTER — Other Ambulatory Visit: Payer: Self-pay | Admitting: Sports Medicine

## 2018-10-26 ENCOUNTER — Encounter (HOSPITAL_COMMUNITY): Payer: Self-pay | Admitting: Emergency Medicine

## 2018-10-26 ENCOUNTER — Encounter (INDEPENDENT_AMBULATORY_CARE_PROVIDER_SITE_OTHER): Payer: Self-pay | Admitting: Family Medicine

## 2018-10-26 ENCOUNTER — Ambulatory Visit (INDEPENDENT_AMBULATORY_CARE_PROVIDER_SITE_OTHER): Payer: PPO | Admitting: Family Medicine

## 2018-10-26 ENCOUNTER — Ambulatory Visit: Payer: Self-pay

## 2018-10-26 ENCOUNTER — Emergency Department (HOSPITAL_COMMUNITY)
Admission: EM | Admit: 2018-10-26 | Discharge: 2018-10-26 | Disposition: A | Discharge status: Home / Self Care | Payer: PPO | Attending: Emergency Medicine | Admitting: Emergency Medicine

## 2018-10-26 ENCOUNTER — Emergency Department (HOSPITAL_COMMUNITY): Payer: PPO

## 2018-10-26 VITALS — BP 160/72 | HR 68 | Temp 97.9°F | Ht 61.0 in | Wt 168.0 lb

## 2018-10-26 DIAGNOSIS — E039 Hypothyroidism, unspecified: Secondary | ICD-10-CM | POA: Insufficient documentation

## 2018-10-26 DIAGNOSIS — E669 Obesity, unspecified: Secondary | ICD-10-CM

## 2018-10-26 DIAGNOSIS — R42 Dizziness and giddiness: Secondary | ICD-10-CM | POA: Diagnosis not present

## 2018-10-26 DIAGNOSIS — R0789 Other chest pain: Secondary | ICD-10-CM | POA: Insufficient documentation

## 2018-10-26 DIAGNOSIS — Z6831 Body mass index (BMI) 31.0-31.9, adult: Secondary | ICD-10-CM | POA: Diagnosis not present

## 2018-10-26 DIAGNOSIS — I10 Essential (primary) hypertension: Secondary | ICD-10-CM | POA: Diagnosis not present

## 2018-10-26 DIAGNOSIS — Z85850 Personal history of malignant neoplasm of thyroid: Secondary | ICD-10-CM | POA: Diagnosis not present

## 2018-10-26 DIAGNOSIS — J45909 Unspecified asthma, uncomplicated: Secondary | ICD-10-CM | POA: Insufficient documentation

## 2018-10-26 DIAGNOSIS — R079 Chest pain, unspecified: Secondary | ICD-10-CM

## 2018-10-26 DIAGNOSIS — Z79899 Other long term (current) drug therapy: Secondary | ICD-10-CM | POA: Insufficient documentation

## 2018-10-26 LAB — CBC
HCT: 38.3 % (ref 36.0–46.0)
Hemoglobin: 12.2 g/dL (ref 12.0–15.0)
MCH: 28.4 pg (ref 26.0–34.0)
MCHC: 31.9 g/dL (ref 30.0–36.0)
MCV: 89.1 fL (ref 80.0–100.0)
Platelets: 280 10*3/uL (ref 150–400)
RBC: 4.3 MIL/uL (ref 3.87–5.11)
RDW: 14.1 % (ref 11.5–15.5)
WBC: 7 10*3/uL (ref 4.0–10.5)
nRBC: 0 % (ref 0.0–0.2)

## 2018-10-26 LAB — BASIC METABOLIC PANEL
Anion gap: 8 (ref 5–15)
BUN: 17 mg/dL (ref 8–23)
CO2: 28 mmol/L (ref 22–32)
Calcium: 8.9 mg/dL (ref 8.9–10.3)
Chloride: 99 mmol/L (ref 98–111)
Creatinine, Ser: 0.73 mg/dL (ref 0.44–1.00)
GFR calc Af Amer: >60 mL/min (ref >60)
GFR calc non Af Amer: >60 mL/min (ref >60)
Glucose, Bld: 105 mg/dL — ABNORMAL HIGH (ref 70–99)
Potassium: 3.5 mmol/L (ref 3.5–5.1)
Sodium: 135 mmol/L (ref 135–145)

## 2018-10-26 LAB — I-STAT TROPONIN, ED: Troponin i, poc: 0 ng/mL (ref 0.00–0.08)

## 2018-10-26 NOTE — ED Notes (Signed)
Pt reports chest pain onset a few days ago. States intermittent, L sided, "tender/sore" 3/10. Denies SOB, N/V, dizziness.

## 2018-10-26 NOTE — Telephone Encounter (Signed)
Pt called stating that she has been dizzy(off balance) this has been an ongoing symptom at every office visit. She denies vertigo. She rates the dizziness as mild. She is just careful. BP 158/80 and 181/92 HR71. Pt is also experiencing occasional chest pain that she describes as an ache. It stays over her heart and does not radiate. She describes the pain as" like a punch". She denies SOB.  She has recently lost 30 lbs on a weight loss program. She denies any other symptoms. Per protocol I advised pt to be see in the ED. Pt refused stating that her symptoms are not new. "Some of my medications can cause dizziness" Appointment scheduled tomorrow per pt request. Care advice read to patient to include go to ED for worsening symptoms. Pain to jaw shoulder back arm.  Pt verbalized understanding of all instructions.  Reason for Disposition . Dizziness or lightheadedness  Answer Assessment - Initial Assessment Questions 1. DESCRIPTION: "Describe your dizziness."     lightheaded 2. LIGHTHEADED: "Do you feel lightheaded?" (e.g., somewhat faint, woozy, weak upon standing)     Off balance 3. VERTIGO: "Do you feel like either you or the room is spinning or tilting?" (i.e. vertigo)     no 4. SEVERITY: "How bad is it?"  "Do you feel like you are going to faint?" "Can you stand and walk?"   - MILD - walking normally   - MODERATE - interferes with normal activities (e.g., work, school)    - SEVERE - unable to stand, requires support to walk, feels like passing out now.      mild 5. ONSET:  "When did the dizziness begin?"     ongoing  6. AGGRAVATING FACTORS: "Does anything make it worse?" (e.g., standing, change in head position)     no 7. HEART RATE: "Can you tell me your heart rate?" "How many beats in 15 seconds?"  (Note: not all patients can do this)       BP 158/80  BP 181/92 HR 71 8. CAUSE: "What do you think is causing the dizziness?"     No dr thought she was dehydrated 9. RECURRENT SYMPTOM: "Have  you had dizziness before?" If so, ask: "When was the last time?" "What happened that time?"    Ongoing reported at each office visit 10. OTHER SYMPTOMS: "Do you have any other symptoms?" (e.g., fever, chest pain, vomiting, diarrhea, bleeding)       Chest pain yesterday an ache 11. PREGNANCY: "Is there any chance you are pregnant?" "When was your last menstrual period?"       N/A  Answer Assessment - Initial Assessment Questions 1. LOCATION: "Where does it hurt?"       Right over heart 2. RADIATION: "Does the pain go anywhere else?" (e.g., into neck, jaw, arms, back)     no 3. ONSET: "When did the chest pain begin?" (Minutes, hours or days)      months 4. PATTERN "Does the pain come and go, or has it been constant since it started?"  "Does it get worse with exertion?"      No usually when at rest 5. DURATION: "How long does it last" (e.g., seconds, minutes, hours)     seconds 6. SEVERITY: "How bad is the pain?"  (e.g., Scale 1-10; mild, moderate, or severe)    - MILD (1-3): doesn't interfere with normal activities     - MODERATE (4-7): interferes with normal activities or awakens from sleep    - SEVERE (8-10):  excruciating pain, unable to do any normal activities       Mild feels like ache a punch just over the heart 7. CARDIAC RISK FACTORS: "Do you have any history of heart problems or risk factors for heart disease?" (e.g., prior heart attack, angina; high blood pressure, diabetes, being overweight, high cholesterol, smoking, or strong family history of heart disease)     HTN cholesterol father/Mother 8. PULMONARY RISK FACTORS: "Do you have any history of lung disease?"  (e.g., blood clots in lung, asthma, emphysema, birth control pills)     no 9. CAUSE: "What do you think is causing the chest pain?"     no 10. OTHER SYMPTOMS: "Do you have any other symptoms?" (e.g., dizziness, nausea, vomiting, sweating, fever, difficulty breathing, cough)       dizziness 11. PREGNANCY: "Is there any  chance you are pregnant?" "When was your last menstrual period?"       N/A  Protocols used: CHEST PAIN-A-AH, DIZZINESS - [facility]

## 2018-10-26 NOTE — Discharge Instructions (Signed)
Continue your regular medications. Follow up with your doctor for recheck and return her with any worsening symptoms or new concerns.

## 2018-10-26 NOTE — ED Provider Notes (Signed)
[facility] EMERGENCY DEPARTMENT Provider Note   CSN: [id number] Arrival date & time: 10/26/18  2031     History   Chief Complaint Chief Complaint  Patient presents with  . Chest Pain    HPI Jackie Stevens is a 72 y.o. female.  Patient with a history of HTN, HLD, GERD presents for evaluation of left sided, non-radiating, intermittent chest pain x 1 week. She describes discomfort as "a bruise" over the left breast that is not associated with SOB or change in breathing of any kind. No nausea or diaphoresis. She feels the pain mostly at night and and mostly after eating. She is active during the day, for example, raking the yard and does not have pain with activity. She had similar symptoms "years" ago that was evaluated and diagnosed as indigestion. She has not taken anything to relieve the pain. She is not currently symptomatic.   The history is provided by the patient. No language interpreter was used.  Chest Pain   Pertinent negatives include no abdominal pain, no cough, no diaphoresis, no fever, no nausea, no shortness of breath and no weakness.    Past Medical History:  Diagnosis Date  . Abnormal cervical cytology 10/25/2012   Follows with Dr Leavy Cella of Gyn  . Allergic rhinitis   . Anemia 10/06/2013  . Anginal pain ([place])    pt has history of CP states had cardiac workup with no specific issues identified   . Anxiety    when increased stress   . Arthritis    "knees; right thumb; shoulders" (01/03/2013)  . Asthma   . Chest pain   . Chicken pox as a child  . Complication of anesthesia   . Constipation   . Decreased hearing   . Dermatitis 03/06/2017  . Diverticulitis 10/25/2012   pt. reports that a drain was placed - 09/2012    . Dry mouth   . Excessive thirst   . External hemorrhoid, bleeding    "sometimes" (01/03/2013)  . Frequent urination   . GERD (gastroesophageal reflux disease)   . H/O hiatal hernia   . Hand tingling   . Heart murmur     . HTN (hypertension)    stress test completed by Anselm Lis, diagnosed as GERD  . Hyperlipidemia   . Hypothyroidism   . Incontinence   . Infertility, female   . Insomnia   . Joint pain   . Kidney stones 1970's   "passed on their own" (01/03/13)  . Knee pain   . Left shoulder pain 09/14/2016  . Low back pain 06/09/2014  . Measles as a child  . Medicare annual wellness visit, subsequent 10/06/2013   Steinhoffer of Dermatology Pneumovax in 2012   . Memory difficulties 09/05/2017  . Obesity 09/11/2017  . OSA on CPAP   . Palpitations   . Paresthesia 03/23/2015   Left face  . PONV (postoperative nausea and vomiting)   . Preventative health care 10/06/2013   Steinhoffer of Dermatology Pneumovax in 2012  . Preventative health care 02/26/2016  . Rheumatoid arteritis ([place])   . Shortness of breath dyspnea    using stairs  . Sinus pain   . Sleep apnea   . Swelling of both lower extremities   . Thyroid cancer ([place]) 1980's  . Thyroid disease   . Tinnitus   . UTI (urinary tract infection) 04/02/2013    Patient Active Problem List   Diagnosis Date Noted  . Other fatigue 02/28/2018  .  Shortness of breath on exertion 02/28/2018  . Vitamin D deficiency 02/28/2018  . Obesity 09/11/2017  . Memory difficulties 09/05/2017  . Dermatitis 03/06/2017  . Arthritis of left shoulder region 09/30/2016  . Pain of joint of left ankle and foot 09/15/2016  . Left shoulder pain 09/14/2016  . Preventative health care 02/26/2016  . Radicular pain of sacrum 02/26/2016  . Vitamin B12 deficiency 02/26/2016  . Ventral hernia 12/02/2015  . Pelvic mass in female 12/02/2015  . Colitis 09/14/2015  . Paresthesia 03/23/2015  . Otitis, externa, infective 03/13/2015  . Hyponatremia 03/13/2015  . Cystitis 09/10/2014  . Headache(784.0) 08/21/2014  . Low back pain 06/09/2014  . Anemia 10/06/2013  . Medicare annual wellness visit, subsequent 10/06/2013  . UTI (urinary tract infection) 04/02/2013  . Asthma,  mild intermittent 04/02/2013  . Internal hemorrhoid, bleeding 01/23/2013  . Diverticulitis of rectosigmoid 11/28/2012  . Colonic diverticular abscess 11/21/2012  . Abnormal cervical cytology 10/25/2012  . Cancer ([place])   . Insomnia   . Hypothyroidism   . Constipation   . Cough 09/02/2012  . Chest pain 08/05/2011  . Hyperlipidemia, mixed 11/08/2010  . Essential hypertension 11/08/2010  . G E R D 11/08/2010  . Obstructive sleep apnea 11/04/2010  . Allergic rhinitis 11/04/2010    Past Surgical History:  Procedure Laterality Date  . CHOLECYSTECTOMY  1990  . COLON SURGERY    . COLOSTOMY REVISION  12/19/2012   Procedure: COLON RESECTION SIGMOID;  Surgeon: Gwenyth Ober, MD;  Location: [facility];  Service: General;  Laterality: N/A;  . CYSTOSCOPY WITH STENT PLACEMENT  12/19/2012   Procedure: CYSTOSCOPY WITH STENT PLACEMENT;  Surgeon: Hanley Ben, MD;  Location: [facility];  Service: Urology;  Laterality: N/A;  . DILATION AND CURETTAGE OF UTERUS  1960's   "lots of them; had miscarriages" (12/19/2012)  . LYSIS OF ADHESION N/A 12/02/2015   Procedure: LAPAROSCOPIC LYSIS OF ADHESION;  Surgeon: Johnathan Hausen, MD;  Location: [facility];  Service: General;  Laterality: N/A;  . ROBOTIC ASSISTED BILATERAL SALPINGO OOPHERECTOMY Bilateral 12/02/2015   Procedure: XI ROBOTIC ASSISTED BILATERAL SALPINGO OOPHORECTOMY;  Surgeon: Everitt Amber, MD;  Location: [facility];  Service: Gynecology;  Laterality: Bilateral;  . SIGMOID RESECTION / RECTOPEXY  12/19/2012  . THYROIDECTOMY, PARTIAL  1988   "then did iodine to remove the rest" (12/19/2012)  . TONSILLECTOMY  1951?  . TRANSRECTAL DRAINAGE OF PELVIC ABSCESS  10/27/2012  . VAGINAL HYSTERECTOMY  1970's   "still have my ovaries" (12/19/2012)     OB History    Gravida  4   Para  2   Term  2   Preterm      AB  2   Living        SAB      TAB      Ectopic      Multiple      Live Births               Home Medications    Prior to Admission medications     Medication Sig Start Date End Date Taking? Authorizing Provider  amLODipine (NORVASC) 2.5 MG tablet Take 2.5 mg by mouth daily.    [provider]  docusate sodium (COLACE) 100 MG capsule Take 100 mg by mouth 2 (two) times daily. Reported on 03/30/2016    [provider]  fluticasone (FLONASE) 50 MCG/ACT nasal spray Place 2 sprays into both nostrils daily. 03/29/17   Mosie Lukes, MD  gabapentin (NEURONTIN) 100 MG  capsule TAKE 2 CAPSULES BY MOUTH AT BEDTIME 10/16/18   Teresa Coombs D, DO  hydrochlorothiazide (HYDRODIURIL) 25 MG tablet TAKE 1 TABLET BY MOUTH EVERY DAY 07/11/18   Mosie Lukes, MD  hyoscyamine (LEVSIN SL) 0.125 MG SL tablet Place 1 tablet (0.125 mg total) under the tongue every 4 (four) hours as needed. 09/04/18   Mosie Lukes, MD  KETOCONAZOLE, TOPICAL, 1 % SHAM Apply 1 Dose topically once a week. 03/03/18   Mosie Lukes, MD  KRILL OIL PO Take 750 mg by mouth daily.     [provider]  levothyroxine (SYNTHROID, LEVOTHROID) 125 MCG tablet Take 1 tablet (125 mcg total) by mouth daily. 12/08/17   Mosie Lukes, MD  losartan (COZAAR) 100 MG tablet TAKE 1 TABLET BY MOUTH EVERY DAY 07/11/18   Mosie Lukes, MD  Multiple Vitamins-Minerals (MULTIVITAMIN WOMEN 50+ PO) Take 1 tablet by mouth daily.    [provider]  naproxen (NAPROSYN) 375 MG tablet TAKE 1 TABLET (375 MG TOTAL) BY MOUTH 2 (TWO) TIMES DAILY WITH A MEAL. 08/02/18   Mosie Lukes, MD  NITROSTAT 0.4 MG SL tablet PLACE 1 TABLET UNDER THE TONGUE EVERY 5 MINUTES AS NEEDED FOR CHEST PAIN 10/16/15   Mosie Lukes, MD  omeprazole (PRILOSEC) 40 MG capsule TAKE 1 CAPSULE BY MOUTH TWICE A DAY 06/14/18   Mosie Lukes, MD  Polyethyl Glycol-Propyl Glycol 0.4-0.3 % GEL Apply 1 drop to eye 2 (two) times daily as needed (dry eyes).     [provider]  polyethylene glycol (MIRALAX / GLYCOLAX) packet Take 17 g by mouth daily.    [provider]  Probiotic Product (PROBIOTIC  ADVANCED PO) Take 2 tablets by mouth at bedtime. Digestive Advantage    [provider]  psyllium (REGULOID) 0.52 G capsule Take 0.52 g by mouth at bedtime.    [provider]  ranitidine (ZANTAC) 300 MG tablet TAKE 1 TABLET BY MOUTH EVERYDAY AT BEDTIME 06/26/18   Mosie Lukes, MD  simvastatin (ZOCOR) 40 MG tablet TAKE 1 TABLET BY MOUTH EVERYDAY AT BEDTIME 08/22/18   Mosie Lukes, MD  sodium chloride (OCEAN) 0.65 % SOLN nasal spray Place 1 spray into both nostrils 2 (two) times daily as needed for congestion.     [provider]  TURMERIC PO Take 1 tablet by mouth daily. Reported on 12/19/2015    [provider]  vitamin B-12 (CYANOCOBALAMIN) 1000 MCG tablet Take 100 mcg by mouth every other day.    [provider]  Vitamin D, Cholecalciferol, 1000 UNITS CAPS Take 1 capsule by mouth daily.    [provider]    Family History Family History  Problem Relation Age of Onset  . Heart disease Father   . Pneumonia Father   . Hypertension Father   . Hyperlipidemia Father   . Cancer Father        skin  . Stroke Father   . Alzheimer's disease Mother   . Heart disease Mother   . Depression Mother   . Emphysema Brother        marijuana and cigarettes  . Alcohol abuse Brother   . Hearing loss Brother   . Diabetes Maternal Grandmother   . Alzheimer's disease Paternal Grandmother   . Cancer Paternal Grandmother        lung?- smoker  . Hyperlipidemia Paternal Grandmother   . Heart attack Paternal Grandfather   . Alcohol abuse Paternal Grandfather   .  Neurofibromatosis Son        schwanomatosis  . Neurofibromatosis Son        swanomatosis  . Cancer Paternal Aunt     Social History Social History   Tobacco Use  . Smoking status: Never Smoker  . Smokeless tobacco: Never Used  Substance Use Topics  . Alcohol use: No    Alcohol/week: 0.0 standard drinks  . Drug use: No     Allergies   Neomycin-bacitracin zn-polymyx; Niacin;  Ciprofloxacin; and Flagyl [metronidazole]   Review of Systems Review of Systems  Constitutional: Negative for chills, diaphoresis and fever.  HENT: Negative.   Respiratory: Negative.  Negative for cough and shortness of breath.   Cardiovascular: Positive for chest pain. Negative for leg swelling.  Gastrointestinal: Negative.  Negative for abdominal pain and nausea.  Musculoskeletal: Negative.  Negative for myalgias.  Skin: Negative.   Neurological: Negative.  Negative for weakness.     Physical Exam Updated Vital Signs BP (!) 201/84 (BP Location: Right Arm)   Pulse 80   Temp 98.3 F (36.8 C) (Oral)   Resp 18   Ht 5\' 2"  (1.575 m)   Wt 76.2 kg   SpO2 100%   BMI 30.73 kg/m   Physical Exam  Constitutional: She is oriented to person, place, and time. She appears well-developed and well-nourished.  HENT:  Head: Normocephalic.  Neck: Normal range of motion. Neck supple. Carotid bruit is not present.  Cardiovascular: Normal rate and regular rhythm.  No murmur heard. Pulmonary/Chest: Effort normal and breath sounds normal. She has no wheezes. She has no rhonchi. She has no rales.  Left lateral chest wall tenderness.  Abdominal: Soft. Bowel sounds are normal. There is no tenderness. There is no rebound and no guarding.  Musculoskeletal: Normal range of motion.  Neurological: She is alert and oriented to person, place, and time.  Skin: Skin is warm and dry. No rash noted.  Psychiatric: She has a normal mood and affect.     ED Treatments / Results  Labs (all labs ordered are listed, but only abnormal results are displayed) Labs Reviewed  BASIC METABOLIC PANEL - Abnormal; Notable for the following components:      Result Value   Glucose, Bld 105 (*)    All other components within normal limits  CBC  I-STAT TROPONIN, ED   Results for orders placed or performed during the hospital encounter of 96/29/52  Basic metabolic panel  Result Value Ref Range   Sodium 135 135 - 145  mmol/L   Potassium 3.5 3.5 - 5.1 mmol/L   Chloride 99 98 - 111 mmol/L   CO2 28 22 - 32 mmol/L   Glucose, Bld 105 (H) 70 - 99 mg/dL   BUN 17 8 - 23 mg/dL   Creatinine, Ser 0.73 0.44 - 1.00 mg/dL   Calcium 8.9 8.9 - 10.3 mg/dL   GFR calc non Af Amer >60 >60 mL/min   GFR calc Af Amer >60 >60 mL/min   Anion gap 8 5 - 15  CBC  Result Value Ref Range   WBC 7.0 4.0 - 10.5 K/uL   RBC 4.30 3.87 - 5.11 MIL/uL   Hemoglobin 12.2 12.0 - 15.0 g/dL   HCT 38.3 36.0 - 46.0 %   MCV 89.1 80.0 - 100.0 fL   MCH 28.4 26.0 - 34.0 pg   MCHC 31.9 30.0 - 36.0 g/dL   RDW 14.1 11.5 - 15.5 %   Platelets 280 150 - 400 K/uL   nRBC  0.0 0.0 - 0.2 %  I-stat troponin, ED  Result Value Ref Range   Troponin i, poc 0.00 0.00 - 0.08 ng/mL   Comment 3             EKG EKG Interpretation  Date/Time:  Thursday October 26 2018 20:38:55 EST Ventricular Rate:  77 PR Interval:  200 QRS Duration: 108 QT Interval:  394 QTC Calculation: 445 R Axis:   -58 Text Interpretation:  Normal sinus rhythm Possible Left atrial enlargement Left axis deviation Left ventricular hypertrophy with repolarization abnormality No significant change since last tracing Abnormal ekg Confirmed by Carmin Muskrat [id number]) on 10/26/2018 10:27:29 PM   Radiology Dg Chest 2 View  Result Date: 10/26/2018 CLINICAL DATA:  Acute onset of intermittent left-sided chest pain and tenderness. EXAM: CHEST - 2 VIEW COMPARISON:  Chest radiograph performed 04/26/2016 FINDINGS: The lungs are well-aerated. Minimal right basilar atelectasis is noted. There is no evidence of pleural effusion or pneumothorax. The heart is normal in size; the mediastinal contour is within normal limits. No acute osseous abnormalities are seen. Degenerative change is noted at the left glenohumeral joint, with osteophyte formation. IMPRESSION: Minimal right basilar atelectasis noted. Lungs otherwise clear. Electronically Signed   By: Garald Balding M.D.   On: 10/26/2018 21:20     Procedures Procedures (including critical care time)  Medications Ordered in ED Medications - No data to display   Initial Impression / Assessment and Plan / ED Course  I have reviewed the triage vital signs and the nursing notes.  Pertinent labs & imaging results that were available during my care of the patient were reviewed by me and considered in my medical decision making (see chart for details).     Patient with left chest pain, intermittent x 1 week, usually associated with having recently eaten. No pain currently.   Labs, including troponin, are essentially negative. CXR negative. EKG shows no concerning changes per Dr. Vanita Panda.   Feel pain is atypical for ACS. Doubt PE, infection, dissection. Given association with diet feel that GERD is a likely diagnosis. She reports a change in her diet over the last 2 weeks with family events that may contribute.   She is seen and evaluated by Dr. Vanita Panda and felt appropriate for discharge home. Encouraged to follow up with PCP.  Final Clinical Impressions(s) / ED Diagnoses   Final diagnoses:  None   1. Nonspecific chest pain  ED Discharge Orders    None       Dennie Bible 10/26/18 [id number]    Carmin Muskrat, MD 10/27/18 [phone]

## 2018-10-27 ENCOUNTER — Ambulatory Visit: Payer: PPO | Admitting: Family

## 2018-10-30 ENCOUNTER — Ambulatory Visit: Payer: PPO | Admitting: Podiatry

## 2018-10-30 ENCOUNTER — Encounter: Payer: Self-pay | Admitting: Podiatry

## 2018-10-30 DIAGNOSIS — M79676 Pain in unspecified toe(s): Secondary | ICD-10-CM | POA: Diagnosis not present

## 2018-10-30 DIAGNOSIS — B351 Tinea unguium: Secondary | ICD-10-CM | POA: Diagnosis not present

## 2018-10-30 DIAGNOSIS — L84 Corns and callosities: Secondary | ICD-10-CM

## 2018-10-31 ENCOUNTER — Encounter (INDEPENDENT_AMBULATORY_CARE_PROVIDER_SITE_OTHER): Payer: Self-pay | Admitting: Family Medicine

## 2018-10-31 ENCOUNTER — Other Ambulatory Visit: Payer: Self-pay

## 2018-10-31 NOTE — Progress Notes (Signed)
Office: [phone]  /  Fax: [phone]   HPI:   Chief Complaint: OBESITY Jackie Stevens is here to discuss her progress with her obesity treatment plan. She is on the Category 2 plan and is following her eating plan approximately 65% of the time. She states she is walking for 90 minutes 1 time per week. Jackie Stevens has been off track with family celebrations.  Her weight is 168 lb (76.2 kg) today and has gained 1 pound since her last visit. She has lost 36 lbs since starting treatment with Jackie Stevens.  Hypertension Jackie Stevens is a 72 y.o. female with hypertension. [name]'s blood pressure is elevated today. She is compliant with Losartan and hydrochlorothiazide. She denies shortness of breath or headaches but notes dull chest pain and dizziness. She is working weight loss to help control her blood pressure with the goal of decreasing her risk of heart attack and stroke. Jackie Stevens's blood pressure is not currently controlled.  Dizziness She has been having some dizziness daily recently. BPs are not low. She is staying well hydrated. She denies sense of "room spinning".   ALLERGIES: Allergies  Allergen Reactions  . Neomycin-Bacitracin Zn-Polymyx Rash    Polysporin- is tolerated   . Niacin Other (See Comments) and Cough    "cough til I threw up" (12/19/2012)  . Ciprofloxacin Hives    Got cipro and flagyl at same time, localized hives to IV arm  . Flagyl [Metronidazole] Hives    Got cipro and flagyl at same time, localized hives to IV arm    MEDICATIONS: Current Outpatient Medications on File Prior to Visit  Medication Sig Dispense Refill  . amLODipine (NORVASC) 2.5 MG tablet Take 2.5 mg by mouth daily.    Marland Kitchen docusate sodium (COLACE) 100 MG capsule Take 100 mg by mouth 2 (two) times daily. Reported on 03/30/2016    . fluticasone (FLONASE) 50 MCG/ACT nasal spray Place 2 sprays into both nostrils daily. 48 g 3  . gabapentin (NEURONTIN) 100 MG capsule TAKE 2 CAPSULES BY MOUTH AT BEDTIME 180 capsule 1  .  hydrochlorothiazide (HYDRODIURIL) 25 MG tablet TAKE 1 TABLET BY MOUTH EVERY DAY 90 tablet 1  . hyoscyamine (LEVSIN SL) 0.125 MG SL tablet Place 1 tablet (0.125 mg total) under the tongue every 4 (four) hours as needed. 30 tablet 1  . KETOCONAZOLE, TOPICAL, 1 % SHAM Apply 1 Dose topically once a week. 200 mL 1  . KRILL OIL PO Take 750 mg by mouth daily.     Marland Kitchen levothyroxine (SYNTHROID, LEVOTHROID) 125 MCG tablet Take 1 tablet (125 mcg total) by mouth daily. 90 tablet 0  . losartan (COZAAR) 100 MG tablet TAKE 1 TABLET BY MOUTH EVERY DAY 90 tablet 1  . Multiple Vitamins-Minerals (MULTIVITAMIN WOMEN 50+ PO) Take 1 tablet by mouth daily.    . naproxen (NAPROSYN) 375 MG tablet TAKE 1 TABLET (375 MG TOTAL) BY MOUTH 2 (TWO) TIMES DAILY WITH A MEAL. 180 tablet 1  . NITROSTAT 0.4 MG SL tablet PLACE 1 TABLET UNDER THE TONGUE EVERY 5 MINUTES AS NEEDED FOR CHEST PAIN 25 tablet 0  . omeprazole (PRILOSEC) 40 MG capsule TAKE 1 CAPSULE BY MOUTH TWICE A DAY 180 capsule 1  . Polyethyl Glycol-Propyl Glycol 0.4-0.3 % GEL Apply 1 drop to eye 2 (two) times daily as needed (dry eyes).     . polyethylene glycol (MIRALAX / GLYCOLAX) packet Take 17 g by mouth daily.    . Probiotic Product (PROBIOTIC ADVANCED PO) Take 2 tablets by mouth  at bedtime. [employer]    . psyllium (REGULOID) 0.52 G capsule Take 0.52 g by mouth at bedtime.    . ranitidine (ZANTAC) 300 MG tablet TAKE 1 TABLET BY MOUTH EVERYDAY AT BEDTIME 90 tablet 1  . simvastatin (ZOCOR) 40 MG tablet TAKE 1 TABLET BY MOUTH EVERYDAY AT BEDTIME 90 tablet 0  . sodium chloride (OCEAN) 0.65 % SOLN nasal spray Place 1 spray into both nostrils 2 (two) times daily as needed for congestion.     . TURMERIC PO Take 1 tablet by mouth daily. Reported on 12/19/2015    . vitamin B-12 (CYANOCOBALAMIN) 1000 MCG tablet Take 100 mcg by mouth every other day.    . Vitamin D, Cholecalciferol, 1000 UNITS CAPS Take 1 capsule by mouth daily.     No current facility-administered  medications on file prior to visit.     PAST MEDICAL HISTORY: Past Medical History:  Diagnosis Date  . Abnormal cervical cytology 10/25/2012   Follows with Dr Leavy Cella of Gyn  . Allergic rhinitis   . Anemia 10/06/2013  . Anginal pain ([place])    pt has history of CP states had cardiac workup with no specific issues identified   . Anxiety    when increased stress   . Arthritis    "knees; right thumb; shoulders" (01-01-13)  . Asthma   . Chest pain   . Chicken pox as a child  . Complication of anesthesia   . Constipation   . Decreased hearing   . Dermatitis 03/06/2017  . Diverticulitis 10/25/2012   pt. reports that a drain was placed - 09/2012    . Dry mouth   . Excessive thirst   . External hemorrhoid, bleeding    "sometimes" (01-01-2013)  . Frequent urination   . GERD (gastroesophageal reflux disease)   . H/O hiatal hernia   . Hand tingling   . Heart murmur   . HTN (hypertension)    stress test completed by Anselm Lis, diagnosed as GERD  . Hyperlipidemia   . Hypothyroidism   . Incontinence   . Infertility, female   . Insomnia   . Joint pain   . Kidney stones 1970's   "passed on their own" (01/01/2013)  . Knee pain   . Left shoulder pain 09/14/2016  . Low back pain 06/09/2014  . Measles as a child  . Medicare annual wellness visit, subsequent 10/06/2013   Steinhoffer of Dermatology Pneumovax in 2012   . Memory difficulties 09/05/2017  . Obesity 09/11/2017  . OSA on CPAP   . Palpitations   . Paresthesia 03/23/2015   Left face  . PONV (postoperative nausea and vomiting)   . Preventative health care 10/06/2013   Steinhoffer of Dermatology Pneumovax in 2012  . Preventative health care 02/26/2016  . Rheumatoid arteritis ([place])   . Shortness of breath dyspnea    using stairs  . Sinus pain   . Sleep apnea   . Swelling of both lower extremities   . Thyroid cancer ([place]) 1980's  . Thyroid disease   . Tinnitus   . UTI (urinary tract infection) 04/02/2013    PAST  SURGICAL HISTORY: Past Surgical History:  Procedure Laterality Date  . CHOLECYSTECTOMY  1990  . COLON SURGERY    . COLOSTOMY REVISION  01/01/13   Procedure: COLON RESECTION SIGMOID;  Surgeon: Gwenyth Ober, MD;  Location: [place];  Service: General;  Laterality: N/A;  . CYSTOSCOPY WITH STENT PLACEMENT  01-01-2013   Procedure: CYSTOSCOPY WITH STENT  PLACEMENT;  Surgeon: Hanley Ben, MD;  Location: [place];  Service: Urology;  Laterality: N/A;  . DILATION AND CURETTAGE OF UTERUS  1960's   "lots of them; had miscarriages" (12/19/2012)  . LYSIS OF ADHESION N/A 12/02/2015   Procedure: LAPAROSCOPIC LYSIS OF ADHESION;  Surgeon: Johnathan Hausen, MD;  Location: [facility];  Service: General;  Laterality: N/A;  . ROBOTIC ASSISTED BILATERAL SALPINGO OOPHERECTOMY Bilateral 12/02/2015   Procedure: XI ROBOTIC ASSISTED BILATERAL SALPINGO OOPHORECTOMY;  Surgeon: Everitt Amber, MD;  Location: [facility];  Service: Gynecology;  Laterality: Bilateral;  . SIGMOID RESECTION / RECTOPEXY  12/19/2012  . THYROIDECTOMY, PARTIAL  1988   "then did iodine to remove the rest" (12/19/2012)  . TONSILLECTOMY  1951?  . TRANSRECTAL DRAINAGE OF PELVIC ABSCESS  10/27/2012  . VAGINAL HYSTERECTOMY  1970's   "still have my ovaries" (12/19/2012)    SOCIAL HISTORY: Social History   Tobacco Use  . Smoking status: Never Smoker  . Smokeless tobacco: Never Used  Substance Use Topics  . Alcohol use: No    Alcohol/week: 0.0 standard drinks  . Drug use: No    FAMILY HISTORY: Family History  Problem Relation Age of Onset  . Heart disease Father   . Pneumonia Father   . Hypertension Father   . Hyperlipidemia Father   . Cancer Father        skin  . Stroke Father   . Alzheimer's disease Mother   . Heart disease Mother   . Depression Mother   . Emphysema Brother        marijuana and cigarettes  . Alcohol abuse Brother   . Hearing loss Brother   . Diabetes Maternal Grandmother   . Alzheimer's disease Paternal Grandmother   . Cancer  Paternal Grandmother        lung?- smoker  . Hyperlipidemia Paternal Grandmother   . Heart attack Paternal Grandfather   . Alcohol abuse Paternal Grandfather   . Neurofibromatosis Son        schwanomatosis  . Neurofibromatosis Son        swanomatosis  . Cancer Paternal Aunt     ROS: Review of Systems  Constitutional: Negative for weight loss.  Respiratory: Negative for shortness of breath.   Cardiovascular: Positive for chest pain.  Gastrointestinal: Negative for nausea and vomiting.  Neurological: Positive for dizziness. Negative for headaches.       Positive for dizziness    PHYSICAL EXAM: Blood pressure (!) 160/72, pulse 68, temperature 97.9 F (36.6 C), temperature source Oral, height 5\' 1"  (1.549 m), weight 168 lb (76.2 kg), SpO2 98 %. Body mass index is 31.74 kg/m. Physical Exam  Constitutional: She is oriented to person, place, and time. She appears well-developed and well-nourished.  Cardiovascular: Normal rate.  Pulmonary/Chest: Effort normal.  Musculoskeletal: Normal range of motion.  Neurological: She is oriented to person, place, and time.  Skin: Skin is warm and dry.  Psychiatric: She has a normal mood and affect. Her behavior is normal.  Vitals reviewed.   RECENT LABS AND TESTS: BMET    Component Value Date/Time   NA 135 10/26/2018 2044   NA 137 07/04/2018 1035   K 3.5 10/26/2018 2044   CL 99 10/26/2018 2044   CO2 28 10/26/2018 2044   GLUCOSE 105 (H) 10/26/2018 2044   BUN 17 10/26/2018 2044   BUN 18 07/04/2018 1035   CREATININE 0.73 10/26/2018 2044   CREATININE 0.75 05/28/2014 0836   CALCIUM 8.9 10/26/2018 2044   GFRNONAA >60 10/26/2018  2044   GFRAA >60 10/26/2018 2044   Lab Results  Component Value Date   HGBA1C 5.6 07/04/2018   HGBA1C 5.8 (H) 07/18/2011   Lab Results  Component Value Date   INSULIN 6.4 07/04/2018   INSULIN 7.9 02/28/2018   CBC    Component Value Date/Time   WBC 7.0 10/26/2018 2044   RBC 4.30 10/26/2018 2044   HGB  12.2 10/26/2018 2044   HCT 38.3 10/26/2018 2044   PLT 280 10/26/2018 2044   MCV 89.1 10/26/2018 2044   MCH 28.4 10/26/2018 2044   MCHC 31.9 10/26/2018 2044   RDW 14.1 10/26/2018 2044   LYMPHSABS 1.5 12/14/2017 0935   MONOABS 0.5 12/14/2017 0935   EOSABS 0.4 12/14/2017 0935   BASOSABS 0.0 12/14/2017 0935   Iron/TIBC/Ferritin/ %Sat    Component Value Date/Time   IRON 33 09/15/2015 0618   TIBC 249 (L) 09/15/2015 0618   FERRITIN 52 09/15/2015 0618   IRONPCTSAT 13 09/15/2015 0618   IRONPCTSAT 22 07/02/2013 1056   Lipid Panel     Component Value Date/Time   CHOL 141 12/14/2017 0935   TRIG 107.0 12/14/2017 0935   HDL 46.60 12/14/2017 0935   CHOLHDL 3 12/14/2017 0935   VLDL 21.4 12/14/2017 0935   LDLCALC 73 12/14/2017 0935   Hepatic Function Panel     Component Value Date/Time   PROT 6.3 07/04/2018 1035   ALBUMIN 4.2 07/04/2018 1035   AST 14 07/04/2018 1035   ALT 17 07/04/2018 1035   ALKPHOS 89 07/04/2018 1035   BILITOT 0.3 07/04/2018 1035   BILIDIR 0.0 11/01/2014 1116   IBILI 0.3 05/28/2014 0836      Component Value Date/Time   TSH 1.570 07/04/2018 1035   TSH 0.49 02/24/2018 0829   TSH 0.40 12/14/2017 0935  Results for SHAWNYA, MAYOR (MRN [id number]) as of 10/31/2018 10:28  Ref. Range 08/07/2018 14:32  Vitamin D, 25-Hydroxy Latest Ref Range: 30.0 - 100.0 ng/mL 61.1    ASSESSMENT AND PLAN: Essential hypertension  Dizziness  Class 1 obesity with serious comorbidity and body mass index (BMI) of 31.0 to 31.9 in adult, unspecified obesity type  PLAN:  Hypertension We discussed sodium restriction, working on healthy weight loss, and a regular exercise program as the means to achieve improved blood pressure control. Nyna agreed with this plan and agreed to follow up as directed. We will continue to monitor her blood pressure as well as her progress with the above lifestyle modifications. Ceciley agrees to continue her  Anti-hypertensive medications and will watch for  signs of hypotension as she continues her lifestyle modifications. She is to check blood pressure at home and bring readings to her next visit. She is to see her primary care physician as soon as possible for dizziness and chest pain. Leiliana agrees to follow up with our clinic in 2 to 3 weeks.  Dizziness Advised her to see PCP as soon as possible for evaluation of dizziness and dull chest pain. .   Obesity Eila is currently in the action stage of change. As such, her goal is to continue with weight loss efforts She has agreed to follow the Category 2 plan Alizandra has been instructed to work up to a goal of 150 minutes of combined cardio and strengthening exercise per week or as above for weight loss and overall health benefits. We discussed the following Behavioral Modification Strategies today: holiday eating strategies and planning for success   Aleesa has agreed to follow up with our clinic in  2 to 3 weeks. She was informed of the importance of frequent follow up visits to maximize her success with intensive lifestyle modifications for her multiple health conditions.   OBESITY BEHAVIORAL INTERVENTION VISIT  Today's visit was # 13  Starting weight: 204 lbs Starting date: 02/28/18 Today's weight : 168 lbs  Today's date: 10/26/2018 Total lbs lost to date: 36 At least 15 minutes were spent on discussing the following behavioral intervention visit.   ASK: We discussed the diagnosis of obesity with [name] today and Mumtaz agreed to give Jackie Stevens permission to discuss obesity behavioral modification therapy today.  ASSESS: Kassady has the diagnosis of obesity and her BMI today is 31.76 Tyarra is in the action stage of change   ADVISE: Ollie was educated on the multiple health risks of obesity as well as the benefit of weight loss to improve her health. She was advised of the need for long term treatment and the importance of lifestyle modifications to improve her current health and  to decrease her risk of future health problems.  AGREE: Multiple dietary modification options and treatment options were discussed and  Jalisa agreed to follow the recommendations documented in the above note.  ARRANGE: Clarrisa was educated on the importance of frequent visits to treat obesity as outlined per CMS and USPSTF guidelines and agreed to schedule her next follow up appointment today.  Wilhemena Durie, am acting as Location manager for [employer], FNP-C.  I have reviewed the above documentation for accuracy and completeness, and I agree with the above.  - Ertha Nabor, FNP-C.

## 2018-10-31 NOTE — Patient Outreach (Signed)
[facility]) Care Management  10/31/2018  Jackie Stevens 03-01-1946 [id number]   TELEPHONE SCREENING Referral date: 10/27/18 Referral source: [employer] Utilization Management Referral reason: Difficulty affording medications Insurance: [employer]  Telephone call to patient regarding. No answer and HIPAA secure message left with members husband   PLAN: Plan to call back in 3-4 business days if call not returned. Plan to send unsuccessful outreach letter Peter Garter RN, [facility], CDE Care Management Coordinator [facility] Care Management [phone]

## 2018-11-01 NOTE — Progress Notes (Signed)
Subjective: 72 y.o. returns the office today for interval calluses to the toes on her left foot as well as for painful, elongated, thickened toenails which she cannot trim herself. Denies any redness or drainage around the nails. Denies any acute changes since last appointment and no new complaints today. Denies any systemic complaints such as fevers, chills, nausea, vomiting.   PCP: Mosie Lukes, MD   Objective: AAO 3, NAD DP/PT pulses palpable, CRT less than 3 seconds  Nails hypertrophic, dystrophic, elongated, brittle, discolored 10. There is tenderness overlying the nails 1-5 bilaterally. There is no surrounding erythema or drainage along the nail sites. Keratotic lesion of the digits on the left x2.  Upon debridement there is no ongoing region, triggering signs of infection. No open lesions or pre-ulcerative lesions are identified. Hammertoe deformity present.  No pain with calf compression, swelling, warmth, erythema. Otherwise, she is doing well without any changes.   Assessment: Patient presents with symptomatic onychomycosis, keratotic lesions  Plan: -Treatment options including alternatives, risks, complications were discussed -Nails sharply debrided 10 without complication/bleeding. -Keratotic lesion sharply debrided x2 without any complications or bleeding -Discussed daily foot inspection. If there are any changes, to call the office immediately.  -Follow-up in 3 months or sooner if any problems are to arise. In the meantime, encouraged to call the office with any questions, concerns, changes symptoms.  Celesta Gentile, DPM

## 2018-11-03 ENCOUNTER — Other Ambulatory Visit: Payer: Self-pay

## 2018-11-03 NOTE — Patient Outreach (Signed)
[place] [facility]) Care Management  [place]  11/03/2018  Jackie Stevens 01-31-1946 [id number]   Reason for referral: medication assistance    Unsuccessful telephone call attempt # 1 to patient.   HIPAA compliant voicemail left requesting a return call  Plan:  I will make another outreach attempt to patient within 3-4 business days  Joetta Manners, [place] [phone]

## 2018-11-03 NOTE — Patient Outreach (Addendum)
[place] [facility]) Care Management  11/03/2018  LAKIESHA RALPHS February 25, 1946 [id number]   TELEPHONE SCREENING Referral date: 10/27/18 Referral source: [employer] Utilization Management Referral reason: Difficulty affording Synthroid Insurance: [employer]  Telephone call to patient regarding referral from [facility] [employer] for concerns with cost of name brand Synthroid.  Member states that she can afford her Synthroid but it is expensive.  States she tried generic levothyroxine in the past and she could not get her levels right.  States she is on numerous medications and supplements and she has dizziness at times.  States she has HTN and would like to learn more about keeping it under control.  States she would like to get the new shingles shot but her pharmacy has not had it available. Instructed on [facility] [employer] services. Member agreeable to having pharmacy referral for medication assistance and possible polypharmacy issues.  Member agreeable to referral to [employer] for HTN disease management.  Given resource to check with [facility] to check on availability of Shingrix vaccine  PLAN: Plan to refer to pharmacy for medication assistance and polypharmacy issues Plan to refer to [employer] for HTN disease management. Peter Garter RN, [facility], CDE Care Management Coordinator [facility] Care Management [phone]

## 2018-11-06 ENCOUNTER — Other Ambulatory Visit: Payer: Self-pay

## 2018-11-06 ENCOUNTER — Ambulatory Visit: Payer: Self-pay

## 2018-11-06 NOTE — Patient Outreach (Signed)
[place] [facility]) Care Management  [place]   11/06/2018  Jackie Stevens November 18, 1946 [id number]  Reason for referral: medication assistance Referral medication(s): Brand name Synthroid Current insurance:HTA  PMHx:  Hypertension, asthma, GERD, diverticulitis, hypothyroidism and hyperlipidemia.   HPI:  Jackie Stevens reports that she pays $65 for a 90 day supply of brand name Synthroid.  She states that she has been on the brand name medication for years and that it works well for her.  She states that she gets this prescription via mail order from [facility].    Objective: Allergies  Allergen Reactions  . Neomycin-Bacitracin Zn-Polymyx Rash    Polysporin- is tolerated   . Niacin Other (See Comments) and Cough    "cough til I threw up" (12/19/2012)  . Ciprofloxacin Hives    Got cipro and flagyl at same time, localized hives to IV arm  . Flagyl [Metronidazole] Hives    Got cipro and flagyl at same time, localized hives to IV arm    Medications Reviewed Today    Reviewed by Dionne Milo, [facility] (Pharmacist) on 11/06/18 at 1458  Med List Status: <None>  Medication Order Taking? Sig Documenting Provider Last Dose Status Informant  acetic acid-hydrocortisone (VOSOL-HC) OTIC solution [id number] No PLACE 4 DROPS INTO THE RIGHT EAR 3 (THREE) TIMES DAILY. [provider] Not Taking Active   amLODipine (NORVASC) 2.5 MG tablet [id number] Yes Take 2.5 mg by mouth daily. [provider] Taking Active   docusate sodium (COLACE) 100 MG capsule [id number] Yes Take 100 mg by mouth 2 (two) times daily. Reported on 03/30/2016 [provider] Taking Active Self  Fluocinolone Acetonide 0.01 % OIL [id number] Yes PLACE 5 DROPS IN EAR(S) 2 TIMES DAILY FOR 14 DAYS. [provider] Taking Active   fluticasone (FLONASE) 50 MCG/ACT nasal spray [id number] Yes Place 2 sprays into both nostrils daily. Mosie Lukes, MD Taking Active   gabapentin  (NEURONTIN) 100 MG capsule [id number] Yes TAKE 2 CAPSULES BY MOUTH AT BEDTIME [name], DO Taking Active   hydrochlorothiazide (HYDRODIURIL) 25 MG tablet [id number] Yes TAKE 1 TABLET BY MOUTH EVERY DAY Mosie Lukes, MD Taking Active   hyoscyamine (LEVSIN SL) 0.125 MG SL tablet [id number] Yes Place 1 tablet (0.125 mg total) under the tongue every 4 (four) hours as needed. Mosie Lukes, MD Taking Active   KETOCONAZOLE, TOPICAL, 1 % SHAM [id number] Yes Apply 1 Dose topically once a week. Mosie Lukes, MD Taking Active   levothyroxine (SYNTHROID, LEVOTHROID) 150 MCG tablet [id number] Yes Take 150 mcg by mouth daily before breakfast. [provider] Taking Active Self  losartan (COZAAR) 100 MG tablet [id number] Yes TAKE 1 TABLET BY MOUTH EVERY DAY Mosie Lukes, MD Taking Active   Multiple Vitamins-Minerals (MULTIVITAMIN WOMEN 50+ PO) [id number] Yes Take 1 tablet by mouth daily. [provider] Taking Active   naproxen (NAPROSYN) 375 MG tablet [id number] Yes TAKE 1 TABLET (375 MG TOTAL) BY MOUTH 2 (TWO) TIMES DAILY WITH A MEAL. Mosie Lukes, MD Taking Active   NITROSTAT 0.4 MG SL tablet [id number] Yes PLACE 1 TABLET UNDER THE TONGUE EVERY 5 MINUTES AS NEEDED FOR CHEST PAIN Mosie Lukes, MD Taking Active Self  omeprazole (PRILOSEC) 40 MG capsule [id number] Yes TAKE 1 CAPSULE BY MOUTH TWICE A DAY Mosie Lukes, MD Taking Active   Polyethyl Glycol-Propyl Glycol 0.4-0.3 % GEL [id number] Yes Apply 1 drop to eye 2 (two) times daily as needed (dry eyes).  [provider] Taking Active Self  ranitidine (ZANTAC) 300 MG tablet [id number] Yes TAKE 1 TABLET BY MOUTH EVERYDAY AT BEDTIME Mosie Lukes, MD Taking Active   simvastatin (ZOCOR) 40 MG tablet [id number] Yes TAKE 1 TABLET BY MOUTH EVERYDAY AT BEDTIME Mosie Lukes, MD Taking Active   sodium chloride (OCEAN) 0.65 % SOLN nasal spray [id number] Yes Place 1 spray into both nostrils 2 (two) times daily as needed for  congestion.  [provider] Taking Active Self  TURMERIC PO [id number] Yes Take 1 tablet by mouth daily. Reported on 12/19/2015 [provider] Taking Active Self  Vitamin D, Cholecalciferol, 1000 UNITS CAPS [id number] Yes Take 1 capsule by mouth daily. [provider] Taking Active Self  Med List Note Annamaria Boots, Clinton D, MD 07/27/11 2116): CPAP 10 Advanced         Assessment:  Drugs sorted by system:  Cardiovascular: amlodipine, hydrochlorothiazide, losartan, nitroglycerin, simvastatin  Pulmonary/Allergy: fluticasone nasal  Gastrointestinal: docusate, hyoscyamine, omeprazole, ranitidine  Endocrine: levothyroxine  Topical: vosol-HC otic, fluocinolone acetonide oil otic, ketoconazole shampoo, artificial tears opthal., ocean nasal spray  Pain: gabapentin, naproxen  Vitamins/Minerals/Supplements: MVI, tumeric, vitamin D  Medication Review Findings:  . Per the [place] List, Hyoscyamine is highly anticholinergic and has questionable efficacy in the elderly. Recommend to avoid.  Medication Assistance Findings:  Extra Help:   []  Already receiving Full Extra Help  []  Already receiving Partial Extra Help  []  Eligible based on reported income and assets  [x]  Not Eligible based on reported income and assets  Patient is paying slightly over $20/month for brand name Synthroid.  There is no other way to get a lower price on this medication.  All of Jackie Stevens's other prescripitons are cheap generics.     Plan: Close the [place] case.   Route discipline closure letter to PCP, Dr. Charlett Blake.  Joetta Manners, PharmD Clinical Pharmacist [place] [phone]

## 2018-11-07 ENCOUNTER — Encounter: Payer: Self-pay | Admitting: Family Medicine

## 2018-11-07 ENCOUNTER — Ambulatory Visit (INDEPENDENT_AMBULATORY_CARE_PROVIDER_SITE_OTHER): Payer: PPO | Admitting: Family Medicine

## 2018-11-07 ENCOUNTER — Ambulatory Visit: Payer: Self-pay

## 2018-11-07 VITALS — BP 132/60 | HR 70 | Temp 98.4°F | Resp 18 | Ht 61.0 in | Wt 168.6 lb

## 2018-11-07 DIAGNOSIS — E782 Mixed hyperlipidemia: Secondary | ICD-10-CM

## 2018-11-07 DIAGNOSIS — E038 Other specified hypothyroidism: Secondary | ICD-10-CM | POA: Diagnosis not present

## 2018-11-07 DIAGNOSIS — R739 Hyperglycemia, unspecified: Secondary | ICD-10-CM

## 2018-11-07 DIAGNOSIS — I10 Essential (primary) hypertension: Secondary | ICD-10-CM

## 2018-11-07 DIAGNOSIS — D649 Anemia, unspecified: Secondary | ICD-10-CM

## 2018-11-07 DIAGNOSIS — E538 Deficiency of other specified B group vitamins: Secondary | ICD-10-CM

## 2018-11-07 DIAGNOSIS — E559 Vitamin D deficiency, unspecified: Secondary | ICD-10-CM | POA: Diagnosis not present

## 2018-11-07 DIAGNOSIS — R0789 Other chest pain: Secondary | ICD-10-CM

## 2018-11-07 DIAGNOSIS — K219 Gastro-esophageal reflux disease without esophagitis: Secondary | ICD-10-CM | POA: Diagnosis not present

## 2018-11-07 DIAGNOSIS — N3000 Acute cystitis without hematuria: Secondary | ICD-10-CM

## 2018-11-07 MED ORDER — NITROGLYCERIN 0.4 MG SL SUBL: SUBLINGUAL_TABLET | SUBLINGUAL | 1 refills | Status: AC

## 2018-11-07 MED ORDER — HYOSCYAMINE SULFATE 0.125 MG SL SUBL: 0.1250 mg | SUBLINGUAL_TABLET | SUBLINGUAL | 1 refills | Status: DC | PRN

## 2018-11-07 NOTE — Patient Instructions (Signed)
Mylanta for any heartburn and chest pain  Encouraged increased rest and hydration, add probiotics, zinc such as Coldeze or Xicam. Treat fevers as needed. Elderberry and Vitamin C 500 to 1000 mg daily and Mucinex twice daily for 10 days for any respiratory illnesss  Nonspecific Chest Pain Chest pain can be caused by many different conditions. There is always a chance that your pain could be related to something serious, such as a heart attack or a blood clot in your lungs. Chest pain can also be caused by conditions that are not life-threatening. If you have chest pain, it is very important to follow up with your health care provider. What are the causes? Causes of this condition include:  Heartburn.  Pneumonia or bronchitis.  Anxiety or stress.  Inflammation around your heart (pericarditis) or lung (pleuritis or pleurisy).  A blood clot in your lung.  A collapsed lung (pneumothorax). This can develop suddenly on its own (spontaneous pneumothorax) or from trauma to the chest.  Shingles infection (varicella-zoster virus).  Heart attack.  Damage to the bones, muscles, and cartilage that make up your chest wall. This can include: ? Bruised bones due to injury. ? Strained muscles or cartilage due to frequent or repeated coughing or overwork. ? Fracture to one or more ribs. ? Sore cartilage due to inflammation (costochondritis).  What increases the risk? Risk factors for this condition may include:  Activities that increase your risk for trauma or injury to your chest.  Respiratory infections or conditions that cause frequent coughing.  Medical conditions or overeating that can cause heartburn.  Heart disease or family history of heart disease.  Conditions or health behaviors that increase your risk of developing a blood clot.  Having had chicken pox (varicella zoster).  What are the signs or symptoms? Chest pain can feel like:  Burning or tingling on the surface of your  chest or deep in your chest.  Crushing, pressure, aching, or squeezing pain.  Dull or sharp pain that is worse when you move, cough, or take a deep breath.  Pain that is also felt in your back, neck, shoulder, or arm, or pain that spreads to any of these areas.  Your chest pain may come and go, or it may stay constant. How is this diagnosed? Lab tests or other studies may be needed to find the cause of your pain. Your health care provider may have you take a test called an ECG (electrocardiogram). An ECG records your heartbeat patterns at the time the test is performed. You may also have other tests, such as:  Transthoracic echocardiogram (TTE). In this test, sound waves are used to create a picture of the heart structures and to look at how blood flows through your heart.  Transesophageal echocardiogram (TEE).This is a more advanced imaging test that takes images from inside your body. It allows your health care provider to see your heart in finer detail.  Cardiac monitoring. This allows your health care provider to monitor your heart rate and rhythm in real time.  Holter monitor. This is a portable device that records your heartbeat and can help to diagnose abnormal heartbeats. It allows your health care provider to track your heart activity for several days, if needed.  Stress tests. These can be done through exercise or by taking medicine that makes your heart beat more quickly.  Blood tests.  Other imaging tests.  How is this treated? Treatment depends on what is causing your chest pain. Treatment may include:  Medicines. These may include: ? Acid blockers for heartburn. ? Anti-inflammatory medicine. ? Pain medicine for inflammatory conditions. ? Antibiotic medicine, if an infection is present. ? Medicines to dissolve blood clots. ? Medicines to treat coronary artery disease (CAD).  Supportive care for conditions that do not require medicines. This may  include: ? Resting. ? Applying heat or cold packs to injured areas. ? Limiting activities until pain decreases.  Follow these instructions at home: Medicines  If you were prescribed an antibiotic, take it as told by your health care provider. Do not stop taking the antibiotic even if you start to feel better.  Take over-the-counter and prescription medicines only as told by your health care provider. Lifestyle  Do not use any products that contain nicotine or tobacco, such as cigarettes and e-cigarettes. If you need help quitting, ask your health care provider.  Do not drink alcohol.  Make lifestyle changes as directed by your health care provider. These may include: ? Getting regular exercise. Ask your health care provider to suggest some activities that are safe for you. ? Eating a heart-healthy diet. A registered dietitian can help you to learn healthy eating options. ? Maintaining a healthy weight. ? Managing diabetes, if necessary. ? Reducing stress, such as with yoga or relaxation techniques. General instructions  Avoid any activities that bring on chest pain.  If heartburn is the cause for your chest pain, raise (elevate) the head of your bed about 6 inches (15 cm) by putting blocks under the legs. Sleeping with more pillows does not effectively relieve heartburn because it only changes the position of your head.  Keep all follow-up visits as told by your health care provider. This is important. This includes any further testing if your chest pain does not go away. Contact a health care provider if:  Your chest pain does not go away.  You have a rash with blisters on your chest.  You have a fever.  You have chills. Get help right away if:  Your chest pain is worse.  You have a cough that gets worse, or you cough up blood.  You have severe pain in your abdomen.  You have severe weakness.  You faint.  You have sudden, unexplained chest discomfort.  You have  sudden, unexplained discomfort in your arms, back, neck, or jaw.  You have shortness of breath at any time.  You suddenly start to sweat, or your skin gets clammy.  You feel nauseous or you vomit.  You suddenly feel light-headed or dizzy.  Your heart begins to beat quickly, or it feels like it is skipping beats. These symptoms may represent a serious problem that is an emergency. Do not wait to see if the symptoms will go away. Get medical help right away. Call your local emergency services (911 in the [place] Do not drive yourself to the hospital. This information is not intended to replace advice given to you by your health care provider. Make sure you discuss any questions you have with your health care provider. Document Released: 09/08/2005 Document Revised: 08/23/2016 Document Reviewed: 08/23/2016 Elsevier Interactive Patient Education  2017 [employer].

## 2018-11-08 LAB — COMPREHENSIVE METABOLIC PANEL WITH GFR
ALT: 17 U/L (ref 0–35)
AST: 19 U/L (ref 0–37)
Albumin: 4.6 g/dL (ref 3.5–5.2)
Alkaline Phosphatase: 81 U/L (ref 39–117)
BUN: 18 mg/dL (ref 6–23)
CO2: 30 meq/L (ref 19–32)
Calcium: 9 mg/dL (ref 8.4–10.5)
Chloride: 94 meq/L — ABNORMAL LOW (ref 96–112)
Creatinine, Ser: 0.66 mg/dL (ref 0.40–1.20)
GFR: 92.99 mL/min
Glucose, Bld: 110 mg/dL — ABNORMAL HIGH (ref 70–99)
Potassium: 3.7 meq/L (ref 3.5–5.1)
Sodium: 134 meq/L — ABNORMAL LOW (ref 135–145)
Total Bilirubin: 0.3 mg/dL (ref 0.2–1.2)
Total Protein: 7.1 g/dL (ref 6.0–8.3)

## 2018-11-08 LAB — LIPID PANEL
Cholesterol: 154 mg/dL (ref 0–200)
HDL: 57.7 mg/dL
LDL Cholesterol: 81 mg/dL (ref 0–99)
NonHDL: 96.6
Total CHOL/HDL Ratio: 3
Triglycerides: 78 mg/dL (ref 0.0–149.0)
VLDL: 15.6 mg/dL (ref 0.0–40.0)

## 2018-11-08 LAB — URINALYSIS, ROUTINE W REFLEX MICROSCOPIC
Bilirubin Urine: NEGATIVE
Hgb urine dipstick: NEGATIVE
Ketones, ur: NEGATIVE
Nitrite: NEGATIVE
RBC / HPF: NONE SEEN (ref 0–?)
Specific Gravity, Urine: 1.01 (ref 1.000–1.030)
Total Protein, Urine: NEGATIVE
Urine Glucose: NEGATIVE
Urobilinogen, UA: 0.2 (ref 0.0–1.0)
pH: 6 (ref 5.0–8.0)

## 2018-11-08 LAB — CBC WITH DIFFERENTIAL/PLATELET
Basophils Absolute: 0.1 K/uL (ref 0.0–0.1)
Basophils Relative: 1.1 % (ref 0.0–3.0)
Eosinophils Absolute: 0.2 K/uL (ref 0.0–0.7)
Eosinophils Relative: 1.8 % (ref 0.0–5.0)
HCT: 39.3 % (ref 36.0–46.0)
Hemoglobin: 13.2 g/dL (ref 12.0–15.0)
Lymphocytes Relative: 25.4 % (ref 12.0–46.0)
Lymphs Abs: 2.2 K/uL (ref 0.7–4.0)
MCHC: 33.7 g/dL (ref 30.0–36.0)
MCV: 86.5 fl (ref 78.0–100.0)
Monocytes Absolute: 0.9 K/uL (ref 0.1–1.0)
Monocytes Relative: 10.9 % (ref 3.0–12.0)
Neutro Abs: 5.3 K/uL (ref 1.4–7.7)
Neutrophils Relative %: 60.8 % (ref 43.0–77.0)
Platelets: 319 K/uL (ref 150.0–400.0)
RBC: 4.55 Mil/uL (ref 3.87–5.11)
RDW: 14.3 % (ref 11.5–15.5)
WBC: 8.7 K/uL (ref 4.0–10.5)

## 2018-11-08 LAB — HEMOGLOBIN A1C: Hgb A1c MFr Bld: 5.6 % (ref 4.6–6.5)

## 2018-11-08 LAB — VITAMIN D 25 HYDROXY (VIT D DEFICIENCY, FRACTURES): VITD: 58.36 ng/mL (ref 30.00–100.00)

## 2018-11-08 LAB — TSH: TSH: 0.88 u[IU]/mL (ref 0.35–4.50)

## 2018-11-08 LAB — VITAMIN B12: Vitamin B-12: 905 pg/mL (ref 211–911)

## 2018-11-09 LAB — URINE CULTURE
MICRO NUMBER:: 91430877
SPECIMEN QUALITY:: ADEQUATE

## 2018-11-13 NOTE — Progress Notes (Signed)
Subjective:    Patient ID: Jackie Stevens, female    DOB: November 22, 1946, 72 y.o.   MRN: [id number]  No chief complaint on file.   HPI Patient is in today for emergency room follow-up.  She had presented to the emergency room with some burning chest discomfort.  Cardiac work-up was negative and the pain has not recurred.  She questions whether it was GI related.  She otherwise feels well.  No recent febrile illness or hospitalization.  Her blood pressures have generally been well controlled although she has had an occasional systolic in the 196Q.  Her diastolics have remained in the 82s and 80s.  Her systolics are frequently in the 130s and 140s.  No other acute complaints noted.  Past Medical History:  Diagnosis Date  . Abnormal cervical cytology 10/25/2012   Follows with Dr Leavy Cella of Gyn  . Allergic rhinitis   . Anemia 10/06/2013  . Anginal pain ([place])    pt has history of CP states had cardiac workup with no specific issues identified   . Anxiety    when increased stress   . Arthritis    "knees; right thumb; shoulders" (12-28-12)  . Asthma   . Chest pain   . Chicken pox as a child  . Complication of anesthesia   . Constipation   . Decreased hearing   . Dermatitis 03/06/2017  . Diverticulitis 10/25/2012   pt. reports that a drain was placed - 09/2012    . Dry mouth   . Excessive thirst   . External hemorrhoid, bleeding    "sometimes" (12-28-2012)  . Frequent urination   . GERD (gastroesophageal reflux disease)   . H/O hiatal hernia   . Hand tingling   . Heart murmur   . HTN (hypertension)    stress test completed by Anselm Lis, diagnosed as GERD  . Hyperlipidemia   . Hypothyroidism   . Incontinence   . Infertility, female   . Insomnia   . Joint pain   . Kidney stones 1970's   "passed on their own" (28-Dec-2012)  . Knee pain   . Left shoulder pain 09/14/2016  . Low back pain 06/09/2014  . Measles as a child  . Medicare annual wellness visit, subsequent  10/06/2013   [facility]ffer [facility] D[facility] Pneumovax in 2012   . Memory difficulties 09/05/2017  . Obesity 09/11/2017  . OSA on CPAP   . Palpitations   . Paresthesia 03/23/2015   Left face  . PONV (postoperative nausea and vomiting)   . Preventative health care 10/06/2013   [facility] Pneumovax in 2012  . Preventative health care 02/26/2016  . Rheumatoid arteritis ([place])   . Shortness of breath dyspnea    using stairs  . Sinus pain   . Sleep apnea   . Swelling of both lower extremities   . Thyroid cancer ([place]) 1980's  . Thyroid disease   . Tinnitus   . UTI (urinary tract infection) 04/02/2013    Past Surgical History:  Procedure Laterality Date  . CHOLECYSTECTOMY  1990  . COLON SURGERY    . COLOSTOMY REVISION  12/28/12   Procedure: COLON RESECTION SIGMOID;  Surgeon: Gwenyth Ober, MD;  Location: [facility];  Service: General;  Laterality: N/A;  . CYSTOSCOPY WITH STENT PLACEMENT  12/28/2012   Procedure: CYSTOSCOPY WITH STENT PLACEMENT;  Surgeon: Hanley Ben, MD;  Location: [facility];  Service: Urology;  Laterality: N/A;  . DILATION AND CURETTAGE OF UTERUS  1960's   "  lots of them; had miscarriages" (12/19/2012)  . LYSIS OF ADHESION N/A 12/02/2015   Procedure: LAPAROSCOPIC LYSIS OF ADHESION;  Surgeon: Johnathan Hausen, MD;  Location: [facility];  Service: General;  Laterality: N/A;  . ROBOTIC ASSISTED BILATERAL SALPINGO OOPHERECTOMY Bilateral 12/02/2015   Procedure: XI ROBOTIC ASSISTED BILATERAL SALPINGO OOPHORECTOMY;  Surgeon: Everitt Amber, MD;  Location: [facility];  Service: Gynecology;  Laterality: Bilateral;  . SIGMOID RESECTION / RECTOPEXY  12/19/2012  . THYROIDECTOMY, PARTIAL  1988   "then did iodine to remove the rest" (12/19/2012)  . TONSILLECTOMY  1951?  . TRANSRECTAL DRAINAGE OF PELVIC ABSCESS  10/27/2012  . VAGINAL HYSTERECTOMY  1970's   "still have my ovaries" (12/19/2012)    Family History  Problem Relation Age of Onset  . Heart disease Father   . Pneumonia Father   .  Hypertension Father   . Hyperlipidemia Father   . Cancer Father        skin  . Stroke Father   . Alzheimer's disease Mother   . Heart disease Mother   . Depression Mother   . Emphysema Brother        marijuana and cigarettes  . Alcohol abuse Brother   . Hearing loss Brother   . Diabetes Maternal Grandmother   . Alzheimer's disease Paternal Grandmother   . Cancer Paternal Grandmother        lung?- smoker  . Hyperlipidemia Paternal Grandmother   . Heart attack Paternal Grandfather   . Alcohol abuse Paternal Grandfather   . Neurofibromatosis Son        schwanomatosis  . Neurofibromatosis Son        swanomatosis  . Cancer Paternal Aunt     Social History   Socioeconomic History  . Marital status: Married    Spouse name: [name]  . Number of children: 2  . Years of education: Not on file  . Highest education level: Not on file  Occupational History  . Occupation: Retired  Scientific laboratory technician  . Financial resource strain: Not on file  . Food insecurity:    Worry: Not on file    Inability: Not on file  . Transportation needs:    Medical: Not on file    Non-medical: Not on file  Tobacco Use  . Smoking status: Never Smoker  . Smokeless tobacco: Never Used  Substance and Sexual Activity  . Alcohol use: No    Alcohol/week: 0.0 standard drinks  . Drug use: No  . Sexual activity: Not Currently  Lifestyle  . Physical activity:    Days per week: Not on file    Minutes per session: Not on file  . Stress: Not on file  Relationships  . Social connections:    Talks on phone: Not on file    Gets together: Not on file    Attends religious service: Not on file    Active member of club or organization: Not on file    Attends meetings of clubs or organizations: Not on file    Relationship status: Not on file  . Intimate partner violence:    Fear of current or ex partner: Not on file    Emotionally abused: Not on file    Physically abused: Not on file    Forced sexual activity: Not  on file  Other Topics Concern  . Not on file  Social History Narrative   Married    children    Outpatient Medications Prior to Visit  Medication Sig Dispense Refill  .  acetic acid-hydrocortisone (VOSOL-HC) OTIC solution PLACE 4 DROPS INTO THE RIGHT EAR 3 (THREE) TIMES DAILY.    Marland Kitchen amLODipine (NORVASC) 2.5 MG tablet Take 2.5 mg by mouth daily.    Marland Kitchen docusate sodium (COLACE) 100 MG capsule Take 100 mg by mouth 2 (two) times daily. Reported on 03/30/2016    . Fluocinolone Acetonide 0.01 % OIL PLACE 5 DROPS IN EAR(S) 2 TIMES DAILY FOR 14 DAYS.  2  . fluticasone (FLONASE) 50 MCG/ACT nasal spray Place 2 sprays into both nostrils daily. 48 g 3  . gabapentin (NEURONTIN) 100 MG capsule TAKE 2 CAPSULES BY MOUTH AT BEDTIME 180 capsule 1  . hydrochlorothiazide (HYDRODIURIL) 25 MG tablet TAKE 1 TABLET BY MOUTH EVERY DAY 90 tablet 1  . KETOCONAZOLE, TOPICAL, 1 % SHAM Apply 1 Dose topically once a week. 200 mL 1  . levothyroxine (SYNTHROID, LEVOTHROID) 150 MCG tablet Take 150 mcg by mouth daily before breakfast.    . losartan (COZAAR) 100 MG tablet TAKE 1 TABLET BY MOUTH EVERY DAY 90 tablet 1  . Multiple Vitamins-Minerals (MULTIVITAMIN WOMEN 50+ PO) Take 1 tablet by mouth daily.    . naproxen (NAPROSYN) 375 MG tablet TAKE 1 TABLET (375 MG TOTAL) BY MOUTH 2 (TWO) TIMES DAILY WITH A MEAL. 180 tablet 1  . omeprazole (PRILOSEC) 40 MG capsule TAKE 1 CAPSULE BY MOUTH TWICE A DAY 180 capsule 1  . Polyethyl Glycol-Propyl Glycol 0.4-0.3 % GEL Apply 1 drop to eye 2 (two) times daily as needed (dry eyes).     . ranitidine (ZANTAC) 300 MG tablet TAKE 1 TABLET BY MOUTH EVERYDAY AT BEDTIME 90 tablet 1  . simvastatin (ZOCOR) 40 MG tablet TAKE 1 TABLET BY MOUTH EVERYDAY AT BEDTIME 90 tablet 0  . sodium chloride (OCEAN) 0.65 % SOLN nasal spray Place 1 spray into both nostrils 2 (two) times daily as needed for congestion.     . TURMERIC PO Take 1 tablet by mouth daily. Reported on 12/19/2015    . Vitamin D, Cholecalciferol,  1000 UNITS CAPS Take 1 capsule by mouth daily.    . hyoscyamine (LEVSIN SL) 0.125 MG SL tablet Place 1 tablet (0.125 mg total) under the tongue every 4 (four) hours as needed. 30 tablet 1  . NITROSTAT 0.4 MG SL tablet PLACE 1 TABLET UNDER THE TONGUE EVERY 5 MINUTES AS NEEDED FOR CHEST PAIN 25 tablet 0   No facility-administered medications prior to visit.     Allergies  Allergen Reactions  . Neomycin-Bacitracin Zn-Polymyx Rash    Polysporin- is tolerated   . Niacin Other (See Comments) and Cough    "cough til I threw up" (12/19/2012)  . Ciprofloxacin Hives    Got cipro and flagyl at same time, localized hives to IV arm  . Flagyl [Metronidazole] Hives    Got cipro and flagyl at same time, localized hives to IV arm    Review of Systems  Constitutional: Positive for malaise/fatigue. Negative for fever.  HENT: Negative for congestion.   Eyes: Negative for blurred vision.  Respiratory: Negative for shortness of breath.   Cardiovascular: Negative for chest pain, palpitations and leg swelling.  Gastrointestinal: Negative for abdominal pain, blood in stool and nausea.  Genitourinary: Negative for dysuria and frequency.  Musculoskeletal: Negative for falls.  Skin: Negative for rash.  Neurological: Negative for dizziness, loss of consciousness and headaches.  Endo/Heme/Allergies: Negative for environmental allergies.  Psychiatric/Behavioral: Negative for depression. The patient is not nervous/anxious.        Objective:  Physical Exam  Constitutional: She is oriented to person, place, and time. She appears well-developed and well-nourished. No distress.  HENT:  Head: Normocephalic and atraumatic.  Nose: Nose normal.  Eyes: Right eye exhibits no discharge. Left eye exhibits no discharge.  Neck: Normal range of motion. Neck supple.  Cardiovascular: Normal rate and regular rhythm.  No murmur heard. Pulmonary/Chest: Effort normal and breath sounds normal.  Abdominal: Soft. Bowel  sounds are normal. There is no tenderness.  Musculoskeletal: She exhibits no edema.  Neurological: She is alert and oriented to person, place, and time.  Skin: Skin is warm and dry.  Psychiatric: She has a normal mood and affect.  Nursing note and vitals reviewed.   BP 132/60 (BP Location: Left Arm, Patient Position: Sitting, Cuff Size: Normal)   Pulse 70   Temp 98.4 F (36.9 C) (Oral)   Resp 18   Ht 5\' 1"  (1.549 m)   Wt 168 lb 9.6 oz (76.5 kg)   SpO2 98%   BMI 31.86 kg/m  Wt Readings from Last 3 Encounters:  11/07/18 168 lb 9.6 oz (76.5 kg)  10/26/18 168 lb (76.2 kg)  10/26/18 168 lb (76.2 kg)     Lab Results  Component Value Date   WBC 8.7 11/07/2018   HGB 13.2 11/07/2018   HCT 39.3 11/07/2018   PLT 319.0 11/07/2018   GLUCOSE 110 (H) 11/07/2018   CHOL 154 11/07/2018   TRIG 78.0 11/07/2018   HDL 57.70 11/07/2018   LDLCALC 81 11/07/2018   ALT 17 11/07/2018   AST 19 11/07/2018   NA 134 (L) 11/07/2018   K 3.7 11/07/2018   CL 94 (L) 11/07/2018   CREATININE 0.66 11/07/2018   BUN 18 11/07/2018   CO2 30 11/07/2018   TSH 0.88 11/07/2018   INR 0.99 03/16/2015   HGBA1C 5.6 11/07/2018    Lab Results  Component Value Date   TSH 0.88 11/07/2018   Lab Results  Component Value Date   WBC 8.7 11/07/2018   HGB 13.2 11/07/2018   HCT 39.3 11/07/2018   MCV 86.5 11/07/2018   PLT 319.0 11/07/2018   Lab Results  Component Value Date   NA 134 (L) 11/07/2018   K 3.7 11/07/2018   CO2 30 11/07/2018   GLUCOSE 110 (H) 11/07/2018   BUN 18 11/07/2018   CREATININE 0.66 11/07/2018   BILITOT 0.3 11/07/2018   ALKPHOS 81 11/07/2018   AST 19 11/07/2018   ALT 17 11/07/2018   PROT 7.1 11/07/2018   ALBUMIN 4.6 11/07/2018   CALCIUM 9.0 11/07/2018   ANIONGAP 8 10/26/2018   GFR 92.99 11/07/2018   Lab Results  Component Value Date   CHOL 154 11/07/2018   Lab Results  Component Value Date   HDL 57.70 11/07/2018   Lab Results  Component Value Date   LDLCALC 81 11/07/2018     Lab Results  Component Value Date   TRIG 78.0 11/07/2018   Lab Results  Component Value Date   CHOLHDL 3 11/07/2018   Lab Results  Component Value Date   HGBA1C 5.6 11/07/2018       Assessment & Plan:   Problem List Items Addressed This Visit    Hyperlipidemia, mixed    Tolerating statin, encouraged heart healthy diet, avoid trans fats, minimize simple carbs and saturated fats. Increase exercise as tolerated      Relevant Medications   nitroGLYCERIN (NITROSTAT) 0.4 MG SL tablet   Other Relevant Orders   Lipid panel (Completed)   Essential hypertension  Well controlled, no changes to meds. Encouraged heart healthy diet such as the DASH diet and exercise as tolerated.        Relevant Medications   nitroGLYCERIN (NITROSTAT) 0.4 MG SL tablet   Other Relevant Orders   CBC with Differential/Platelet (Completed)   Comprehensive metabolic panel (Completed)   TSH (Completed)   G E R D    Recently presented to ER with atypical chest pain but work up was negative and in retrospect she believes it was reflux related. Try Mylanta if occurs again and seek care if worsens. Avoid offending foods, start probiotics. Do not eat large meals in late evening and consider raising head of bed.       Relevant Medications   hyoscyamine (LEVSIN SL) 0.125 MG SL tablet   Hypothyroidism    On Levothyroxine, continue to monitor      UTI (urinary tract infection)    Urine culture negative      Relevant Orders   Urinalysis   Urine Culture (Completed)   Anemia   Vitamin B12 deficiency    Supplement and monitor      Relevant Orders   Vitamin B12 (Completed)   Vitamin D deficiency    Supplement and monitor       Relevant Orders   VITAMIN D 25 Hydroxy (Vit-D Deficiency, Fractures) (Completed)    Other Visit Diagnoses    Atypical chest pain    -  Primary   Relevant Orders   Ambulatory referral to Cardiology   Hyperglycemia       Relevant Orders   Hemoglobin A1c (Completed)       I have changed Scotlynn P. Caso's NITROSTAT to nitroGLYCERIN. I am also having her maintain her TURMERIC PO, Polyethyl Glycol-Propyl Glycol, sodium chloride, Vitamin D (Cholecalciferol), docusate sodium, fluticasone, Multiple Vitamins-Minerals (MULTIVITAMIN WOMEN 50+ PO), KETOCONAZOLE (TOPICAL), omeprazole, ranitidine, hydrochlorothiazide, losartan, naproxen, simvastatin, amLODipine, gabapentin, Fluocinolone Acetonide, acetic acid-hydrocortisone, levothyroxine, and hyoscyamine.  Meds ordered this encounter  Medications  . nitroGLYCERIN (NITROSTAT) 0.4 MG SL tablet    Sig: PLACE 1 TABLET UNDER THE TONGUE EVERY 5 MINUTES AS NEEDED FOR CHEST PAIN    Dispense:  25 tablet    Refill:  1  . hyoscyamine (LEVSIN SL) 0.125 MG SL tablet    Sig: Place 1 tablet (0.125 mg total) under the tongue every 4 (four) hours as needed.    Dispense:  30 tablet    Refill:  1     Penni Homans, MD

## 2018-11-13 NOTE — Assessment & Plan Note (Signed)
On Levothyroxine, continue to monitor 

## 2018-11-13 NOTE — Assessment & Plan Note (Signed)
Supplement and monitor 

## 2018-11-13 NOTE — Assessment & Plan Note (Signed)
Urine culture negative.

## 2018-11-13 NOTE — Assessment & Plan Note (Signed)
Tolerating statin, encouraged heart healthy diet, avoid trans fats, minimize simple carbs and saturated fats. Increase exercise as tolerated 

## 2018-11-13 NOTE — Assessment & Plan Note (Addendum)
Well controlled, no changes to meds. Encouraged heart healthy diet such as the DASH diet and exercise as tolerated.  °

## 2018-11-13 NOTE — Assessment & Plan Note (Addendum)
Recently presented to ER with atypical chest pain but work up was negative and in retrospect she believes it was reflux related. Try Mylanta if occurs again and seek care if worsens. Avoid offending foods, start probiotics. Do not eat large meals in late evening and consider raising head of bed.

## 2018-11-14 ENCOUNTER — Ambulatory Visit (INDEPENDENT_AMBULATORY_CARE_PROVIDER_SITE_OTHER): Payer: PPO | Admitting: Family Medicine

## 2018-11-14 ENCOUNTER — Encounter (INDEPENDENT_AMBULATORY_CARE_PROVIDER_SITE_OTHER): Payer: Self-pay | Admitting: Family Medicine

## 2018-11-14 VITALS — BP 119/61 | HR 65 | Temp 98.1°F | Ht 61.0 in | Wt 164.0 lb

## 2018-11-14 DIAGNOSIS — Z6831 Body mass index (BMI) 31.0-31.9, adult: Secondary | ICD-10-CM | POA: Diagnosis not present

## 2018-11-14 DIAGNOSIS — E669 Obesity, unspecified: Secondary | ICD-10-CM

## 2018-11-14 DIAGNOSIS — I10 Essential (primary) hypertension: Secondary | ICD-10-CM

## 2018-11-14 DIAGNOSIS — R0789 Other chest pain: Secondary | ICD-10-CM | POA: Diagnosis not present

## 2018-11-17 NOTE — Progress Notes (Signed)
Office: [phone]  /  Fax: [phone]   HPI:   Chief Complaint: OBESITY Jackie Stevens is here to discuss her progress with her obesity treatment plan. She is on the  follow the Category 2 plan and is following her eating plan approximately 50 % of the time. She states she is exercising 0 minutes 0 times per week. Dean has been off the plan some with Thanksgiving. She is eating all of the protein.  Her weight is 164 lb (74.4 kg) today and has had a weight loss of 4 pounds over a period of 3 weeks since her last visit. She has lost 40 lbs since starting treatment with [place].  Hypertension Jackie Stevens is a 72 y.o. female with hypertension.  Jackie Stevens denies chest pain or shortness of breath on exertion. She is working weight loss to help control her blood pressure with the goal of decreasing her risk of heart attack and stroke. Jackie Stevens blood pressure is currently controlled on losartan and hydrochlorothiazide . She recently went to the emergency department for chest pain, but cardiac origin ruled out.   Chest pain  Jackie Stevens is having intermittent chest pain. Her PCP believes this is GI in origin. She was told by her PCP to take Mylanta when this happens, but she has not bought any.   ALLERGIES: Allergies  Allergen Reactions  . Neomycin-Bacitracin Zn-Polymyx Rash    Polysporin- is tolerated   . Niacin Other (See Comments) and Cough    "cough til I threw up" (12/19/2012)  . Ciprofloxacin Hives    Got cipro and flagyl at same time, localized hives to IV arm  . Flagyl [Metronidazole] Hives    Got cipro and flagyl at same time, localized hives to IV arm    MEDICATIONS: Current Outpatient Medications on File Prior to Visit  Medication Sig Dispense Refill  . acetic acid-hydrocortisone (VOSOL-HC) OTIC solution PLACE 4 DROPS INTO THE RIGHT EAR 3 (THREE) TIMES DAILY.    Marland Kitchen amLODipine (NORVASC) 2.5 MG tablet Take 2.5 mg by mouth daily.    Marland Kitchen docusate sodium (COLACE) 100 MG capsule Take 100 mg  by mouth 2 (two) times daily. Reported on 03/30/2016    . Fluocinolone Acetonide 0.01 % OIL PLACE 5 DROPS IN EAR(S) 2 TIMES DAILY FOR 14 DAYS.  2  . fluticasone (FLONASE) 50 MCG/ACT nasal spray Place 2 sprays into both nostrils daily. 48 g 3  . gabapentin (NEURONTIN) 100 MG capsule TAKE 2 CAPSULES BY MOUTH AT BEDTIME 180 capsule 1  . hydrochlorothiazide (HYDRODIURIL) 25 MG tablet TAKE 1 TABLET BY MOUTH EVERY DAY 90 tablet 1  . hyoscyamine (LEVSIN SL) 0.125 MG SL tablet Place 1 tablet (0.125 mg total) under the tongue every 4 (four) hours as needed. 30 tablet 1  . KETOCONAZOLE, TOPICAL, 1 % SHAM Apply 1 Dose topically once a week. 200 mL 1  . levothyroxine (SYNTHROID, LEVOTHROID) 150 MCG tablet Take 150 mcg by mouth daily before breakfast.    . losartan (COZAAR) 100 MG tablet TAKE 1 TABLET BY MOUTH EVERY DAY 90 tablet 1  . Multiple Vitamins-Minerals (MULTIVITAMIN WOMEN 50+ PO) Take 1 tablet by mouth daily.    . naproxen (NAPROSYN) 375 MG tablet TAKE 1 TABLET (375 MG TOTAL) BY MOUTH 2 (TWO) TIMES DAILY WITH A MEAL. 180 tablet 1  . nitroGLYCERIN (NITROSTAT) 0.4 MG SL tablet PLACE 1 TABLET UNDER THE TONGUE EVERY 5 MINUTES AS NEEDED FOR CHEST PAIN 25 tablet 1  . omeprazole (PRILOSEC) 40 MG capsule  TAKE 1 CAPSULE BY MOUTH TWICE A DAY 180 capsule 1  . Polyethyl Glycol-Propyl Glycol 0.4-0.3 % GEL Apply 1 drop to eye 2 (two) times daily as needed (dry eyes).     . ranitidine (ZANTAC) 300 MG tablet TAKE 1 TABLET BY MOUTH EVERYDAY AT BEDTIME 90 tablet 1  . simvastatin (ZOCOR) 40 MG tablet TAKE 1 TABLET BY MOUTH EVERYDAY AT BEDTIME 90 tablet 0  . sodium chloride (OCEAN) 0.65 % SOLN nasal spray Place 1 spray into both nostrils 2 (two) times daily as needed for congestion.     . TURMERIC PO Take 1 tablet by mouth daily. Reported on 12/19/2015    . Vitamin D, Cholecalciferol, 1000 UNITS CAPS Take 1 capsule by mouth daily.     No current facility-administered medications on file prior to visit.     PAST MEDICAL  HISTORY: Past Medical History:  Diagnosis Date  . Abnormal cervical cytology 10/25/2012   Follows with Dr Leavy Cella of Gyn  . Allergic rhinitis   . Anemia 10/06/2013  . Anginal pain ([place])    pt has history of CP states had cardiac workup with no specific issues identified   . Anxiety    when increased stress   . Arthritis    "knees; right thumb; shoulders" (01-16-2013)  . Asthma   . Chest pain   . Chicken pox as a child  . Complication of anesthesia   . Constipation   . Decreased hearing   . Dermatitis 03/06/2017  . Diverticulitis 10/25/2012   pt. reports that a drain was placed - 09/2012    . Dry mouth   . Excessive thirst   . External hemorrhoid, bleeding    "sometimes" (16-Jan-2013)  . Frequent urination   . GERD (gastroesophageal reflux disease)   . H/O hiatal hernia   . Hand tingling   . Heart murmur   . HTN (hypertension)    stress test completed by Anselm Lis, diagnosed as GERD  . Hyperlipidemia   . Hypothyroidism   . Incontinence   . Infertility, female   . Insomnia   . Joint pain   . Kidney stones 1970's   "passed on their own" (2013-01-16)  . Knee pain   . Left shoulder pain 09/14/2016  . Low back pain 06/09/2014  . Measles as a child  . Medicare annual wellness visit, subsequent 10/06/2013   Steinhoffer of Dermatology Pneumovax in 2012   . Memory difficulties 09/05/2017  . Obesity 09/11/2017  . OSA on CPAP   . Palpitations   . Paresthesia 03/23/2015   Left face  . PONV (postoperative nausea and vomiting)   . Preventative health care 10/06/2013   Steinhoffer of Dermatology Pneumovax in 2012  . Preventative health care 02/26/2016  . Rheumatoid arteritis ([place])   . Shortness of breath dyspnea    using stairs  . Sinus pain   . Sleep apnea   . Swelling of both lower extremities   . Thyroid cancer ([place]) 1980's  . Thyroid disease   . Tinnitus   . UTI (urinary tract infection) 04/02/2013    PAST SURGICAL HISTORY: Past Surgical History:  Procedure  Laterality Date  . CHOLECYSTECTOMY  1990  . COLON SURGERY    . COLOSTOMY REVISION  01/16/13   Procedure: COLON RESECTION SIGMOID;  Surgeon: Gwenyth Ober, MD;  Location: [facility];  Service: General;  Laterality: N/A;  . CYSTOSCOPY WITH STENT PLACEMENT  2013-01-16   Procedure: CYSTOSCOPY WITH STENT PLACEMENT;  Surgeon: Hanley Ben,  MD;  Location: [facility];  Service: Urology;  Laterality: N/A;  . DILATION AND CURETTAGE OF UTERUS  1960's   "lots of them; had miscarriages" (12/19/2012)  . LYSIS OF ADHESION N/A 12/02/2015   Procedure: LAPAROSCOPIC LYSIS OF ADHESION;  Surgeon: Johnathan Hausen, MD;  Location: [facility];  Service: General;  Laterality: N/A;  . ROBOTIC ASSISTED BILATERAL SALPINGO OOPHERECTOMY Bilateral 12/02/2015   Procedure: XI ROBOTIC ASSISTED BILATERAL SALPINGO OOPHORECTOMY;  Surgeon: Everitt Amber, MD;  Location: [facility];  Service: Gynecology;  Laterality: Bilateral;  . SIGMOID RESECTION / RECTOPEXY  12/19/2012  . THYROIDECTOMY, PARTIAL  1988   "then did iodine to remove the rest" (12/19/2012)  . TONSILLECTOMY  1951?  . TRANSRECTAL DRAINAGE OF PELVIC ABSCESS  10/27/2012  . VAGINAL HYSTERECTOMY  1970's   "still have my ovaries" (12/19/2012)    SOCIAL HISTORY: Social History   Tobacco Use  . Smoking status: Never Smoker  . Smokeless tobacco: Never Used  Substance Use Topics  . Alcohol use: No    Alcohol/week: 0.0 standard drinks  . Drug use: No    FAMILY HISTORY: Family History  Problem Relation Age of Onset  . Heart disease Father   . Pneumonia Father   . Hypertension Father   . Hyperlipidemia Father   . Cancer Father        skin  . Stroke Father   . Alzheimer's disease Mother   . Heart disease Mother   . Depression Mother   . Emphysema Brother        marijuana and cigarettes  . Alcohol abuse Brother   . Hearing loss Brother   . Diabetes Maternal Grandmother   . Alzheimer's disease Paternal Grandmother   . Cancer Paternal Grandmother        lung?- smoker  .  Hyperlipidemia Paternal Grandmother   . Heart attack Paternal Grandfather   . Alcohol abuse Paternal Grandfather   . Neurofibromatosis Son        schwanomatosis  . Neurofibromatosis Son        swanomatosis  . Cancer Paternal Aunt     ROS: Review of Systems  Constitutional: Positive for weight loss.  Respiratory: Negative for shortness of breath.   Cardiovascular: Positive for chest pain.    PHYSICAL EXAM: Blood pressure 119/61, pulse 65, temperature 98.1 F (36.7 C), temperature source Oral, height 5\' 1"  (1.549 m), weight 164 lb (74.4 kg), SpO2 96 %. Body mass index is 30.99 kg/m. Physical Exam  Constitutional: She is oriented to person, place, and time. She appears well-developed and well-nourished.  HENT:  Head: Normocephalic.  Eyes: Pupils are equal, round, and reactive to light.  Neck: Normal range of motion.  Cardiovascular: Normal rate.  Pulmonary/Chest: Effort normal.  Musculoskeletal: Normal range of motion.  Neurological: She is alert and oriented to person, place, and time.  Skin: Skin is warm and dry.  Psychiatric: She has a normal mood and affect. Her behavior is normal.  Vitals reviewed.   RECENT LABS AND TESTS: BMET    Component Value Date/Time   NA 134 (L) 11/07/2018 1758   NA 137 07/04/2018 1035   K 3.7 11/07/2018 1758   CL 94 (L) 11/07/2018 1758   CO2 30 11/07/2018 1758   GLUCOSE 110 (H) 11/07/2018 1758   BUN 18 11/07/2018 1758   BUN 18 07/04/2018 1035   CREATININE 0.66 11/07/2018 1758   CREATININE 0.75 05/28/2014 0836   CALCIUM 9.0 11/07/2018 1758   GFRNONAA >60 10/26/2018 2044   GFRAA >60  10/26/2018 2044   Lab Results  Component Value Date   HGBA1C 5.6 11/07/2018   HGBA1C 5.6 07/04/2018   HGBA1C 5.8 (H) 07/18/2011   Lab Results  Component Value Date   INSULIN 6.4 07/04/2018   INSULIN 7.9 02/28/2018   CBC    Component Value Date/Time   WBC 8.7 11/07/2018 1758   RBC 4.55 11/07/2018 1758   HGB 13.2 11/07/2018 1758   HCT 39.3  11/07/2018 1758   PLT 319.0 11/07/2018 1758   MCV 86.5 11/07/2018 1758   MCH 28.4 10/26/2018 2044   MCHC 33.7 11/07/2018 1758   RDW 14.3 11/07/2018 1758   LYMPHSABS 2.2 11/07/2018 1758   MONOABS 0.9 11/07/2018 1758   EOSABS 0.2 11/07/2018 1758   BASOSABS 0.1 11/07/2018 1758   Iron/TIBC/Ferritin/ %Sat    Component Value Date/Time   IRON 33 09/15/2015 0618   TIBC 249 (L) 09/15/2015 0618   FERRITIN 52 09/15/2015 0618   IRONPCTSAT 13 09/15/2015 0618   IRONPCTSAT 22 07/02/2013 1056   Lipid Panel     Component Value Date/Time   CHOL 154 11/07/2018 1758   TRIG 78.0 11/07/2018 1758   HDL 57.70 11/07/2018 1758   CHOLHDL 3 11/07/2018 1758   VLDL 15.6 11/07/2018 1758   LDLCALC 81 11/07/2018 1758   Hepatic Function Panel     Component Value Date/Time   PROT 7.1 11/07/2018 1758   PROT 6.3 07/04/2018 1035   ALBUMIN 4.6 11/07/2018 1758   ALBUMIN 4.2 07/04/2018 1035   AST 19 11/07/2018 1758   ALT 17 11/07/2018 1758   ALKPHOS 81 11/07/2018 1758   BILITOT 0.3 11/07/2018 1758   BILITOT 0.3 07/04/2018 1035   BILIDIR 0.0 11/01/2014 1116   IBILI 0.3 05/28/2014 0836      Component Value Date/Time   TSH 0.88 11/07/2018 1758   TSH 1.570 07/04/2018 1035   TSH 0.49 02/24/2018 0829    ASSESSMENT AND PLAN: Essential hypertension  Other chest pain  Class 1 obesity with serious comorbidity and body mass index (BMI) of 31.0 to 31.9 in adult, unspecified obesity type  PLAN: Hypertension We discussed sodium restriction, working on healthy weight loss, and a regular exercise program as the means to achieve improved blood pressure control. Jackie Stevens agreed with this plan and agreed to follow up as directed. We will continue to monitor her blood pressure as well as her progress with the above lifestyle modifications. She will continue her medications as prescribed and will watch for signs of hypotension as she continues her lifestyle modifications.  Chest pain Jackie Stevens agreed to take mylanta if  she has discomfort again, and will call 911 if chest pain persist. Agrees to follow up with our clinic as directed.   Obesity Jackie Stevens is currently in the action stage of change. As such, her goal is to continue with weight loss efforts She has agreed to follow the Category 2 plan Jackie Stevens has been instructed not to exercise yet.  We discussed the following Behavioral Modification Strategies today: increasing vegetables and planning for success.    Jackie Stevens has agreed to follow up with our clinic in 3 weeks. She was informed of the importance of frequent follow up visits to maximize her success with intensive lifestyle modifications for her multiple health conditions.   OBESITY BEHAVIORAL INTERVENTION VISIT  Today's visit was # 14   Starting weight: 204 lb Starting date: 02/28/18 Today's weight : Weight: 164 lb (74.4 kg)  Today's date: 11/14/18 Total lbs lost to date: 40 At least 58  minutes were spent on discussing the following behavioral intervention visit.   ASK: We discussed the diagnosis of obesity with Jackie Stevens today and Jackie Stevens agreed to give [place] permission to discuss obesity behavioral modification therapy today.  ASSESS: Jackie Stevens has the diagnosis of obesity and her BMI today is 44 Jackie Stevens is in the action stage of change   ADVISE: Jackie Stevens was educated on the multiple health risks of obesity as well as the benefit of weight loss to improve her health. She was advised of the need for long term treatment and the importance of lifestyle modifications to improve her current health and to decrease her risk of future health problems.  AGREE: Multiple dietary modification options and treatment options were discussed and  Jackie Stevens agreed to follow the recommendations documented in the above note.  ARRANGE: Jackie Stevens was educated on the importance of frequent visits to treat obesity as outlined per CMS and USPSTF guidelines and agreed to schedule her next follow up appointment today.  I,  Renee Ramus, am acting as Location manager for [employer], FNP-C. I have reviewed the above documentation for accuracy and completeness, and I agree with the above.  - Jaqwon Manfred, FNP-C.

## 2018-11-20 ENCOUNTER — Encounter (INDEPENDENT_AMBULATORY_CARE_PROVIDER_SITE_OTHER): Payer: Self-pay | Admitting: Family Medicine

## 2018-11-21 ENCOUNTER — Other Ambulatory Visit: Payer: Self-pay | Admitting: Family Medicine

## 2018-11-30 ENCOUNTER — Other Ambulatory Visit: Payer: Self-pay

## 2018-11-30 DIAGNOSIS — G4733 Obstructive sleep apnea (adult) (pediatric): Secondary | ICD-10-CM | POA: Diagnosis not present

## 2018-11-30 NOTE — Patient Outreach (Signed)
[facility]) Care Management  11/30/2018  Jackie Stevens Sep 20, 1946 [id number]    1st outreach attempt to the patient for initial assessment.  No answer.  HIPAA compliant voicemail left with contact information.  Plan: RN Health Coach will make outreach attempt the patient within thirty business days.  Lazaro Arms RN, BSN, [place] Direct Dial:  [phone]  Fax: [phone]

## 2018-12-04 ENCOUNTER — Other Ambulatory Visit: Payer: Self-pay | Admitting: Family Medicine

## 2018-12-04 ENCOUNTER — Encounter (INDEPENDENT_AMBULATORY_CARE_PROVIDER_SITE_OTHER): Payer: Self-pay | Admitting: Family Medicine

## 2018-12-04 ENCOUNTER — Ambulatory Visit (INDEPENDENT_AMBULATORY_CARE_PROVIDER_SITE_OTHER): Payer: PPO | Admitting: Family Medicine

## 2018-12-04 VITALS — BP 126/66 | HR 79 | Temp 97.9°F | Ht 61.0 in | Wt 165.0 lb

## 2018-12-04 DIAGNOSIS — K219 Gastro-esophageal reflux disease without esophagitis: Secondary | ICD-10-CM

## 2018-12-04 DIAGNOSIS — Z6831 Body mass index (BMI) 31.0-31.9, adult: Secondary | ICD-10-CM | POA: Diagnosis not present

## 2018-12-04 DIAGNOSIS — E669 Obesity, unspecified: Secondary | ICD-10-CM | POA: Diagnosis not present

## 2018-12-04 DIAGNOSIS — R059 Cough, unspecified: Secondary | ICD-10-CM

## 2018-12-04 DIAGNOSIS — R05 Cough: Secondary | ICD-10-CM

## 2018-12-04 DIAGNOSIS — I10 Essential (primary) hypertension: Secondary | ICD-10-CM

## 2018-12-04 DIAGNOSIS — J302 Other seasonal allergic rhinitis: Secondary | ICD-10-CM

## 2018-12-04 NOTE — Progress Notes (Signed)
Office: [phone]  /  Fax: [phone]   HPI:   Chief Complaint: OBESITY Jackie Stevens is here to discuss her progress with her obesity treatment plan. She is on the Category 2 plan and is following her eating plan approximately 25% of the time. She states she is exercising 0 minutes 0 times per week. Jackie Stevens has been off track with diet. She has quite a few sweets in the house. She plans to get back on track after Christmas.  Her weight is 165 lb (74.8 kg) today and has gained 1 pound since her last visit. She has lost 39 lbs since starting treatment with [place].  Hypertension Jackie Stevens is a 72 y.o. female with hypertension. Janey's blood pressure is well controlled on losartan 100 mg, Norvasc 2.5 mg, and hydrochlorothiazide 25 mg. She notes chest pressure and she has been to the emergency department for chest pain. She notes dizziness. She is working weight loss to help control her blood pressure with the goal of decreasing her risk of heart attack and stroke.   ALLERGIES: Allergies  Allergen Reactions  . Neomycin-Bacitracin Zn-Polymyx Rash    Polysporin- is tolerated   . Niacin Other (See Comments) and Cough    "cough til I threw up" (12/19/2012)  . Ciprofloxacin Hives    Got cipro and flagyl at same time, localized hives to IV arm  . Flagyl [Metronidazole] Hives    Got cipro and flagyl at same time, localized hives to IV arm    MEDICATIONS: Current Outpatient Medications on File Prior to Visit  Medication Sig Dispense Refill  . acetic acid-hydrocortisone (VOSOL-HC) OTIC solution PLACE 4 DROPS INTO THE RIGHT EAR 3 (THREE) TIMES DAILY.    Marland Kitchen amLODipine (NORVASC) 2.5 MG tablet Take 2.5 mg by mouth daily.    Marland Kitchen docusate sodium (COLACE) 100 MG capsule Take 100 mg by mouth 2 (two) times daily. Reported on 03/30/2016    . Fluocinolone Acetonide 0.01 % OIL PLACE 5 DROPS IN EAR(S) 2 TIMES DAILY FOR 14 DAYS.  2  . fluticasone (FLONASE) 50 MCG/ACT nasal spray Place 2 sprays into both nostrils  daily. 48 g 3  . hydrochlorothiazide (HYDRODIURIL) 25 MG tablet TAKE 1 TABLET BY MOUTH EVERY DAY 90 tablet 1  . hyoscyamine (LEVSIN SL) 0.125 MG SL tablet Place 1 tablet (0.125 mg total) under the tongue every 4 (four) hours as needed. 30 tablet 1  . KETOCONAZOLE, TOPICAL, 1 % SHAM Apply 1 Dose topically once a week. 200 mL 1  . levothyroxine (SYNTHROID, LEVOTHROID) 150 MCG tablet Take 150 mcg by mouth daily before breakfast.    . losartan (COZAAR) 100 MG tablet TAKE 1 TABLET BY MOUTH EVERY DAY 90 tablet 1  . Multiple Vitamins-Minerals (MULTIVITAMIN WOMEN 50+ PO) Take 1 tablet by mouth daily.    . naproxen (NAPROSYN) 375 MG tablet TAKE 1 TABLET (375 MG TOTAL) BY MOUTH 2 (TWO) TIMES DAILY WITH A MEAL. 180 tablet 1  . nitroGLYCERIN (NITROSTAT) 0.4 MG SL tablet PLACE 1 TABLET UNDER THE TONGUE EVERY 5 MINUTES AS NEEDED FOR CHEST PAIN 25 tablet 1  . Polyethyl Glycol-Propyl Glycol 0.4-0.3 % GEL Apply 1 drop to eye 2 (two) times daily as needed (dry eyes).     . ranitidine (ZANTAC) 300 MG tablet TAKE 1 TABLET BY MOUTH EVERYDAY AT BEDTIME 90 tablet 1  . simvastatin (ZOCOR) 40 MG tablet TAKE 1 TABLET BY MOUTH EVERYDAY AT BEDTIME 90 tablet 0  . sodium chloride (OCEAN) 0.65 % SOLN nasal spray  Place 1 spray into both nostrils 2 (two) times daily as needed for congestion.     . TURMERIC PO Take 1 tablet by mouth daily. Reported on 12/19/2015    . Vitamin D, Cholecalciferol, 1000 UNITS CAPS Take 1 capsule by mouth daily.    Marland Kitchen gabapentin (NEURONTIN) 100 MG capsule TAKE 2 CAPSULES BY MOUTH AT BEDTIME (Patient not taking: Reported on 12/04/2018) 180 capsule 1   No current facility-administered medications on file prior to visit.     PAST MEDICAL HISTORY: Past Medical History:  Diagnosis Date  . Abnormal cervical cytology 10/25/2012   Follows with Dr Leavy Cella of Gyn  . Allergic rhinitis   . Anemia 10/06/2013  . Anginal pain ([place])    pt has history of CP states had cardiac workup with no specific issues  identified   . Anxiety    when increased stress   . Arthritis    "knees; right thumb; shoulders" (01/16/2013)  . Asthma   . Chest pain   . Chicken pox as a child  . Complication of anesthesia   . Constipation   . Decreased hearing   . Dermatitis 03/06/2017  . Diverticulitis 10/25/2012   pt. reports that a drain was placed - 09/2012    . Dry mouth   . Excessive thirst   . External hemorrhoid, bleeding    "sometimes" (01-16-13)  . Frequent urination   . GERD (gastroesophageal reflux disease)   . H/O hiatal hernia   . Hand tingling   . Heart murmur   . HTN (hypertension)    stress test completed by Anselm Lis, diagnosed as GERD  . Hyperlipidemia   . Hypothyroidism   . Incontinence   . Infertility, female   . Insomnia   . Joint pain   . Kidney stones 1970's   "passed on their own" (January 16, 2013)  . Knee pain   . Left shoulder pain 09/14/2016  . Low back pain 06/09/2014  . Measles as a child  . Medicare annual wellness visit, subsequent 10/06/2013   Steinhoffer of Dermatology Pneumovax in 2012   . Memory difficulties 09/05/2017  . Obesity 09/11/2017  . OSA on CPAP   . Palpitations   . Paresthesia 03/23/2015   Left face  . PONV (postoperative nausea and vomiting)   . Preventative health care 10/06/2013   Steinhoffer of Dermatology Pneumovax in 2012  . Preventative health care 02/26/2016  . Rheumatoid arteritis ([place])   . Shortness of breath dyspnea    using stairs  . Sinus pain   . Sleep apnea   . Swelling of both lower extremities   . Thyroid cancer ([place]) 1980's  . Thyroid disease   . Tinnitus   . UTI (urinary tract infection) 04/02/2013    PAST SURGICAL HISTORY: Past Surgical History:  Procedure Laterality Date  . CHOLECYSTECTOMY  1990  . COLON SURGERY    . COLOSTOMY REVISION  Jan 16, 2013   Procedure: COLON RESECTION SIGMOID;  Surgeon: Gwenyth Ober, MD;  Location: [facility];  Service: General;  Laterality: N/A;  . CYSTOSCOPY WITH STENT PLACEMENT  January 16, 2013   Procedure:  CYSTOSCOPY WITH STENT PLACEMENT;  Surgeon: Hanley Ben, MD;  Location: [facility];  Service: Urology;  Laterality: N/A;  . DILATION AND CURETTAGE OF UTERUS  1960's   "lots of them; had miscarriages" (01/16/13)  . LYSIS OF ADHESION N/A 12/02/2015   Procedure: LAPAROSCOPIC LYSIS OF ADHESION;  Surgeon: Johnathan Hausen, MD;  [facility];  Service: General;  Laterality: N/A;  .  ROBOTIC ASSISTED BILATERAL SALPINGO OOPHERECTOMY Bilateral 12/02/2015   Procedure: XI ROBOTIC ASSISTED BILATERAL SALPINGO OOPHORECTOMY;  Surgeon: Everitt Amber, MD;  Location: [facility];  Service: Gynecology;  Laterality: Bilateral;  . SIGMOID RESECTION / RECTOPEXY  12/19/2012  . THYROIDECTOMY, PARTIAL  1988   "then did iodine to remove the rest" (12/19/2012)  . TONSILLECTOMY  1951?  . TRANSRECTAL DRAINAGE OF PELVIC ABSCESS  10/27/2012  . VAGINAL HYSTERECTOMY  1970's   "still have my ovaries" (12/19/2012)    SOCIAL HISTORY: Social History   Tobacco Use  . Smoking status: Never Smoker  . Smokeless tobacco: Never Used  Substance Use Topics  . Alcohol use: No    Alcohol/week: 0.0 standard drinks  . Drug use: No    FAMILY HISTORY: Family History  Problem Relation Age of Onset  . Heart disease Father   . Pneumonia Father   . Hypertension Father   . Hyperlipidemia Father   . Cancer Father        skin  . Stroke Father   . Alzheimer's disease Mother   . Heart disease Mother   . Depression Mother   . Emphysema Brother        marijuana and cigarettes  . Alcohol abuse Brother   . Hearing loss Brother   . Diabetes Maternal Grandmother   . Alzheimer's disease Paternal Grandmother   . Cancer Paternal Grandmother        lung?- smoker  . Hyperlipidemia Paternal Grandmother   . Heart attack Paternal Grandfather   . Alcohol abuse Paternal Grandfather   . Neurofibromatosis Son        schwanomatosis  . Neurofibromatosis Son        swanomatosis  . Cancer Paternal Aunt     ROS: Review of Systems  Constitutional:  Negative for weight loss.  Cardiovascular:       + Chest pressure  Neurological: Positive for dizziness.    PHYSICAL EXAM: Blood pressure 126/66, pulse 79, temperature 97.9 F (36.6 C), temperature source Oral, height 5\' 1"  (1.549 m), weight 165 lb (74.8 kg), SpO2 98 %. Body mass index is 31.18 kg/m. Physical Exam Vitals signs reviewed.  Constitutional:      Appearance: Normal appearance. She is obese.  Cardiovascular:     Rate and Rhythm: Normal rate.     Pulses: Normal pulses.  Pulmonary:     Effort: Pulmonary effort is normal.  Musculoskeletal: Normal range of motion.  Skin:    General: Skin is warm and dry.  Neurological:     Mental Status: She is alert and oriented to person, place, and time.  Psychiatric:        Mood and Affect: Mood normal.        Behavior: Behavior normal.     RECENT LABS AND TESTS: BMET    Component Value Date/Time   NA 134 (L) 11/07/2018 1758   NA 137 07/04/2018 1035   K 3.7 11/07/2018 1758   CL 94 (L) 11/07/2018 1758   CO2 30 11/07/2018 1758   GLUCOSE 110 (H) 11/07/2018 1758   BUN 18 11/07/2018 1758   BUN 18 07/04/2018 1035   CREATININE 0.66 11/07/2018 1758   CREATININE 0.75 05/28/2014 0836   CALCIUM 9.0 11/07/2018 1758   GFRNONAA >60 10/26/2018 2044   GFRAA >60 10/26/2018 2044   Lab Results  Component Value Date   HGBA1C 5.6 11/07/2018   HGBA1C 5.6 07/04/2018   HGBA1C 5.8 (H) 07/18/2011   Lab Results  Component Value Date  INSULIN 6.4 07/04/2018   INSULIN 7.9 02/28/2018   CBC    Component Value Date/Time   WBC 8.7 11/07/2018 1758   RBC 4.55 11/07/2018 1758   HGB 13.2 11/07/2018 1758   HCT 39.3 11/07/2018 1758   PLT 319.0 11/07/2018 1758   MCV 86.5 11/07/2018 1758   MCH 28.4 10/26/2018 2044   MCHC 33.7 11/07/2018 1758   RDW 14.3 11/07/2018 1758   LYMPHSABS 2.2 11/07/2018 1758   MONOABS 0.9 11/07/2018 1758   EOSABS 0.2 11/07/2018 1758   BASOSABS 0.1 11/07/2018 1758   Iron/TIBC/Ferritin/ %Sat    Component Value  Date/Time   IRON 33 09/15/2015 0618   TIBC 249 (L) 09/15/2015 0618   FERRITIN 52 09/15/2015 0618   IRONPCTSAT 13 09/15/2015 0618   IRONPCTSAT 22 07/02/2013 1056   Lipid Panel     Component Value Date/Time   CHOL 154 11/07/2018 1758   TRIG 78.0 11/07/2018 1758   HDL 57.70 11/07/2018 1758   CHOLHDL 3 11/07/2018 1758   VLDL 15.6 11/07/2018 1758   LDLCALC 81 11/07/2018 1758   Hepatic Function Panel     Component Value Date/Time   PROT 7.1 11/07/2018 1758   PROT 6.3 07/04/2018 1035   ALBUMIN 4.6 11/07/2018 1758   ALBUMIN 4.2 07/04/2018 1035   AST 19 11/07/2018 1758   ALT 17 11/07/2018 1758   ALKPHOS 81 11/07/2018 1758   BILITOT 0.3 11/07/2018 1758   BILITOT 0.3 07/04/2018 1035   BILIDIR 0.0 11/01/2014 1116   IBILI 0.3 05/28/2014 0836      Component Value Date/Time   TSH 0.88 11/07/2018 1758   TSH 1.570 07/04/2018 1035   TSH 0.49 02/24/2018 0829    ASSESSMENT AND PLAN: Essential hypertension  Class 1 obesity with serious comorbidity and body mass index (BMI) of 31.0 to 31.9 in adult, unspecified obesity type  PLAN:  Hypertension We discussed sodium restriction, working on healthy weight loss, and a regular exercise program as the means to achieve improved blood pressure control. Anetra agreed with this plan and agreed to follow up as directed. We will continue to monitor her blood pressure as well as her progress with the above lifestyle modifications. Fendi agrees to continue her medications and will watch for signs of hypotension as she continues her lifestyle modifications. She has an appointment with Cardiology on 12/11/18 for evaluation. Madgeline agrees to follow up with our clinic in 3 weeks.  Obesity Tannisha is currently in the action stage of change. As such, her goal is to continue with weight loss efforts She has agreed to follow the Category 2 plan Edith has been instructed to work up to a goal of 150 minutes of combined cardio and strengthening exercise per  week for weight loss and overall health benefits. We discussed the following Behavioral Modification Strategies today: increasing lean protein intake, decreasing simple carbohydrates, and planning for success    Emylee has agreed to follow up with our clinic in 3 weeks. She was informed of the importance of frequent follow up visits to maximize her success with intensive lifestyle modifications for her multiple health conditions.   OBESITY BEHAVIORAL INTERVENTION VISIT  Today's visit was # 15  Starting weight: 204 lbs Starting date: 02/28/18 Today's weight : 165 lbs  Today's date: 12/04/2018 Total lbs lost to date: 39 At least 15 minutes were spent on discussing the following behavioral intervention visit.   ASK: We discussed the diagnosis of obesity with Jackie Stevens today and Ayari agreed to give [place]  permission to discuss obesity behavioral modification therapy today.  ASSESS: Jackie Stevens has the diagnosis of obesity and her BMI today is 31.19 Jackie Stevens is in the action stage of change   ADVISE: Jackie Stevens was educated on the multiple health risks of obesity as well as the benefit of weight loss to improve her health. She was advised of the need for long term treatment and the importance of lifestyle modifications to improve her current health and to decrease her risk of future health problems.  AGREE: Multiple dietary modification options and treatment options were discussed and  Jackie Stevens agreed to follow the recommendations documented in the above note.  ARRANGE: Jackie Stevens was educated on the importance of frequent visits to treat obesity as outlined per CMS and USPSTF guidelines and agreed to schedule her next follow up appointment today.  Jackie Stevens, am acting as Location manager for [employer], FNP-C.  I have reviewed the above documentation for accuracy and completeness, and I agree with the above.  - Krystopher Kuenzel, FNP-C.

## 2018-12-08 NOTE — Progress Notes (Signed)
Cardiology Office Note   Date:  12/11/2018   ID:  Jackie, Stevens 10-07-1946, MRN [id number]  PCP:  Mosie Lukes, MD  Cardiologist:   Jenkins Rouge, MD   No chief complaint on file.     History of Present Illness: Jackie Stevens is a 72 y.o. female who presents for consultation regarding chest pain Referred by Dr Charlett Blake Reviewed her office note from 11/07/18 Seen in ER 11/14 with atypical chest pain. History of HTN, HLD GERD Intermittent left sided chest pain. Felt like bruising. Pain worse at night and after eating not worse with exertion. Does not come on when active walking raking yard or other activity. Similar issues years ago related to indigestion In ER ECG normal , r/o CXR NAD   She has had normal myovue in 2012. TTE done 03/10/18 reviewed done for dyspnea EF 60-65% grade one diastolic no valve disease and aortic root measures 40 mm  Her pain is atypical fleeting lasting minutes often non exertional     Past Medical History:  Diagnosis Date  . Abnormal cervical cytology 10/25/2012   Follows with Dr Leavy Cella of Gyn  . Allergic rhinitis   . Anemia 10/06/2013  . Anginal pain ([place])    pt has history of CP states had cardiac workup with no specific issues identified   . Anxiety    when increased stress   . Arthritis    "knees; right thumb; shoulders" (12-28-12)  . Asthma   . Chest pain   . Chicken pox as a child  . Complication of anesthesia   . Constipation   . Decreased hearing   . Dermatitis 03/06/2017  . Diverticulitis 10/25/2012   pt. reports that a drain was placed - 09/2012    . Dry mouth   . Excessive thirst   . External hemorrhoid, bleeding    "sometimes" (2012-12-28)  . Frequent urination   . GERD (gastroesophageal reflux disease)   . H/O hiatal hernia   . Hand tingling   . Heart murmur   . HTN (hypertension)    stress test completed by Anselm Lis, diagnosed as GERD  . Hyperlipidemia   . Hypothyroidism   . Incontinence   .  Infertility, female   . Insomnia   . Joint pain   . Kidney stones 1970's   "passed on their own" (12/28/2012)  . Knee pain   . Left shoulder pain 09/14/2016  . Low back pain 06/09/2014  . Measles as a child  . Medicare annual wellness visit, subsequent 10/06/2013   Steinhoffer of Dermatology Pneumovax in 2012   . Memory difficulties 09/05/2017  . Obesity 09/11/2017  . OSA on CPAP   . Palpitations   . Paresthesia 03/23/2015   Left face  . PONV (postoperative nausea and vomiting)   . Preventative health care 10/06/2013   Steinhoffer of Dermatology Pneumovax in 2012  . Preventative health care 02/26/2016  . Rheumatoid arteritis ([place])   . Shortness of breath dyspnea    using stairs  . Sinus pain   . Sleep apnea   . Swelling of both lower extremities   . Thyroid cancer ([place]) 1980's  . Thyroid disease   . Tinnitus   . UTI (urinary tract infection) 04/02/2013    Past Surgical History:  Procedure Laterality Date  . CHOLECYSTECTOMY  1990  . COLON SURGERY    . COLOSTOMY REVISION  12-28-2012   Procedure: COLON RESECTION SIGMOID;  Surgeon: Gwenyth Ober, MD;  Location: [facility];  Service: General;  Laterality: N/A;  . CYSTOSCOPY WITH STENT PLACEMENT  12/19/2012   Procedure: CYSTOSCOPY WITH STENT PLACEMENT;  Surgeon: Hanley Ben, MD;  Location: [facility];  Service: Urology;  Laterality: N/A;  . DILATION AND CURETTAGE OF UTERUS  1960's   "lots of them; had miscarriages" (12/19/2012)  . LYSIS OF ADHESION N/A 12/02/2015   Procedure: LAPAROSCOPIC LYSIS OF ADHESION;  Surgeon: Johnathan Hausen, MD;  [facility];  Service: General;  Laterality: N/A;  . ROBOTIC ASSISTED BILATERAL SALPINGO OOPHERECTOMY Bilateral 12/02/2015   Procedure: XI ROBOTIC ASSISTED BILATERAL SALPINGO OOPHORECTOMY;  Surgeon: Everitt Amber, MD;  [facility];  Service: Gynecology;  Laterality: Bilateral;  . SIGMOID RESECTION / RECTOPEXY  12/19/2012  . THYROIDECTOMY, PARTIAL  1988   "then did iodine to remove the rest" (12/19/2012)  .  TONSILLECTOMY  1951?  . TRANSRECTAL DRAINAGE OF PELVIC ABSCESS  10/27/2012  . VAGINAL HYSTERECTOMY  1970's   "still have my ovaries" (12/19/2012)     Current Outpatient Medications  Medication Sig Dispense Refill  . acetic acid-hydrocortisone (VOSOL-HC) OTIC solution PLACE 4 DROPS INTO THE RIGHT EAR 3 (THREE) TIMES DAILY.    Marland Kitchen amLODipine (NORVASC) 2.5 MG tablet Take 2.5 mg by mouth daily.    Marland Kitchen docusate sodium (COLACE) 100 MG capsule Take 100 mg by mouth 2 (two) times daily. Reported on 03/30/2016    . Fluocinolone Acetonide 0.01 % OIL PLACE 5 DROPS IN EAR(S) 2 TIMES DAILY FOR 14 DAYS.  2  . fluticasone (FLONASE) 50 MCG/ACT nasal spray Place 2 sprays into both nostrils daily. 48 g 3  . hydrochlorothiazide (HYDRODIURIL) 25 MG tablet TAKE 1 TABLET BY MOUTH EVERY DAY 90 tablet 1  . hyoscyamine (LEVSIN SL) 0.125 MG SL tablet Place 1 tablet (0.125 mg total) under the tongue every 4 (four) hours as needed. 30 tablet 1  . KETOCONAZOLE, TOPICAL, 1 % SHAM Apply 1 Dose topically once a week. 200 mL 1  . levothyroxine (SYNTHROID, LEVOTHROID) 150 MCG tablet Take 150 mcg by mouth daily before breakfast.    . losartan (COZAAR) 100 MG tablet TAKE 1 TABLET BY MOUTH EVERY DAY 90 tablet 1  . Multiple Vitamins-Minerals (MULTIVITAMIN WOMEN 50+ PO) Take 1 tablet by mouth daily.    . naproxen (NAPROSYN) 375 MG tablet TAKE 1 TABLET (375 MG TOTAL) BY MOUTH 2 (TWO) TIMES DAILY WITH A MEAL. 180 tablet 1  . nitroGLYCERIN (NITROSTAT) 0.4 MG SL tablet PLACE 1 TABLET UNDER THE TONGUE EVERY 5 MINUTES AS NEEDED FOR CHEST PAIN 25 tablet 1  . omeprazole (PRILOSEC) 40 MG capsule TAKE 1 CAPSULE BY MOUTH TWICE A DAY 180 capsule 1  . Polyethyl Glycol-Propyl Glycol 0.4-0.3 % GEL Apply 1 drop to eye 2 (two) times daily as needed (dry eyes).     . ranitidine (ZANTAC) 300 MG tablet TAKE 1 TABLET BY MOUTH EVERYDAY AT BEDTIME 90 tablet 1  . simvastatin (ZOCOR) 40 MG tablet TAKE 1 TABLET BY MOUTH EVERYDAY AT BEDTIME 90 tablet 0  . sodium  chloride (OCEAN) 0.65 % SOLN nasal spray Place 1 spray into both nostrils 2 (two) times daily as needed for congestion.     . TURMERIC PO Take 1 tablet by mouth daily. Reported on 12/19/2015    . Vitamin D, Cholecalciferol, 1000 UNITS CAPS Take 1 capsule by mouth daily.     No current facility-administered medications for this visit.     Allergies:   Neomycin-bacitracin zn-polymyx; Niacin; Ciprofloxacin; and Flagyl [metronidazole]  Social History:  The patient  reports that she has never smoked. She has never used smokeless tobacco. She reports that she does not drink alcohol or use drugs.   Family History:  The patient's family history includes Alcohol abuse in her brother and paternal grandfather; Alzheimer's disease in her mother and paternal grandmother; Cancer in her father, paternal aunt, and paternal grandmother; Depression in her mother; Diabetes in her maternal grandmother; Emphysema in her brother; Hearing loss in her brother; Heart attack in her paternal grandfather; Heart disease in her father and mother; Hyperlipidemia in her father and paternal grandmother; Hypertension in her father; Neurofibromatosis in her son and son; Pneumonia in her father; Stroke in her father.    ROS:  Please see the history of present illness.   Otherwise, review of systems are positive for none.   All other systems are reviewed and negative.    PHYSICAL EXAM: VS:  BP (!) 170/80 (BP Location: Left Arm, Patient Position: Sitting, Cuff Size: Normal)   Pulse 80   Ht 5\' 2"  (1.575 m)   Wt 173 lb 12.8 oz (78.8 kg)   SpO2 99% Comment: at rest  BMI 31.79 kg/m  , BMI Body mass index is 31.79 kg/m. Affect appropriate Healthy:  appears stated age 16: normal Neck supple with no adenopathy JVP normal no bruits no thyromegaly Lungs clear with no wheezing and good diaphragmatic motion Heart:  S1/S2 no murmur, no rub, gallop or click PMI normal Abdomen: benighn, BS positve, no tenderness, no AAA no  bruit.  No HSM or HJR Distal pulses intact with no bruits No edema Neuro non-focal Skin warm and dry No muscular weakness    EKG:  10/27/18 SR LAD poor R wave progression    Recent Labs: 11/07/2018: ALT 17; BUN 18; Creatinine, Ser 0.66; Hemoglobin 13.2; Platelets 319.0; Potassium 3.7; Sodium 134; TSH 0.88    Lipid Panel    Component Value Date/Time   CHOL 154 11/07/2018 1758   TRIG 78.0 11/07/2018 1758   HDL 57.70 11/07/2018 1758   CHOLHDL 3 11/07/2018 1758   VLDL 15.6 11/07/2018 1758   LDLCALC 81 11/07/2018 1758      Wt Readings from Last 3 Encounters:  12/11/18 173 lb 12.8 oz (78.8 kg)  12/04/18 165 lb (74.8 kg)  11/14/18 164 lb (74.4 kg)      Other studies Reviewed: Additional studies/ records that were reviewed today include: Notes from ER November CXR, labs, ECG, Myovue 2012 TTE March 2019 Primary care notes .    ASSESSMENT AND PLAN:  1.  Chest Pain: atypical history of normal myovue in 2012 unable to walk on treadmill due to knee pain Increased risk of CAD with bilateral bruits on exam f/u Lexiscan Myovue  2. HTN:  Well controlled.  Continue current medications and low sodium Dash type diet.   3. HLD:  On statin labs with primary  4. Thyroid:  On replacement normal TSH  5. Bruit:  Bilateral not previously described f/u carotid duplex    Current medicines are reviewed at length with the patient today.  The patient does not have concerns regarding medicines.  The following changes have been made:  no change  Labs/ tests ordered today include: Carotid Duplex Lexi Myovue   Orders Placed This Encounter  Procedures  . MYOCARDIAL PERFUSION IMAGING     Disposition:   FU with me PRN if tests normal      Signed, Jenkins Rouge, MD  12/11/2018 4:17 PM    [facility] [place], [place], [place]  [address] Phone: [phone]; Fax: [phone]

## 2018-12-11 ENCOUNTER — Encounter: Payer: Self-pay | Admitting: Cardiovascular Disease

## 2018-12-11 ENCOUNTER — Encounter

## 2018-12-11 ENCOUNTER — Encounter (INDEPENDENT_AMBULATORY_CARE_PROVIDER_SITE_OTHER): Payer: Self-pay | Admitting: Family Medicine

## 2018-12-11 ENCOUNTER — Ambulatory Visit: Payer: PPO | Admitting: Cardiovascular Disease

## 2018-12-11 VITALS — BP 170/80 | HR 80 | Ht 62.0 in | Wt 173.8 lb

## 2018-12-11 DIAGNOSIS — R0789 Other chest pain: Secondary | ICD-10-CM

## 2018-12-11 DIAGNOSIS — E785 Hyperlipidemia, unspecified: Secondary | ICD-10-CM | POA: Diagnosis not present

## 2018-12-11 NOTE — Patient Instructions (Signed)
Medication Instructions:   If you need a refill on your cardiac medications before your next appointment, please call your pharmacy.   Lab work:  If you have labs (blood work) drawn today and your tests are completely normal, you will receive your results only by: Marland Kitchen MyChart Message (if you have MyChart) OR . A paper copy in the mail If you have any lab test that is abnormal or we need to change your treatment, we will call you to review the results.  Testing/Procedures: Your physician has requested that you have a carotid duplex. This test is an ultrasound of the carotid arteries in your neck. It looks at blood flow through these arteries that supply the brain with blood. Allow one hour for this exam. There are no restrictions or special instructions.  Your physician has requested that you have a lexiscan myoview. For further information please visit [email or web address]. Please follow instruction sheet, as given.  Follow-Up: At [facility], you and your health needs are our priority.  As part of our continuing mission to provide you with exceptional heart care, we have created designated Provider Care Teams.  These Care Teams include your primary Cardiologist (physician) and Advanced Practice Providers (APPs -  Physician Assistants and Nurse Practitioners) who all work together to provide you with the care you need, when you need it. Your physician recommends that you schedule a follow-up appointment as needed with Dr. Johnsie Cancel.

## 2018-12-14 ENCOUNTER — Other Ambulatory Visit: Payer: Self-pay | Admitting: Cardiovascular Disease

## 2018-12-14 DIAGNOSIS — R0989 Other specified symptoms and signs involving the circulatory and respiratory systems: Secondary | ICD-10-CM

## 2018-12-18 ENCOUNTER — Telehealth (HOSPITAL_COMMUNITY): Payer: Self-pay

## 2018-12-18 NOTE — Telephone Encounter (Signed)
Pt contacted and detailed instructions were given. Pt stated that she would be here for her test. S.Kelsay Haggard EMTP

## 2018-12-19 ENCOUNTER — Encounter (HOSPITAL_COMMUNITY): Payer: PPO

## 2018-12-20 ENCOUNTER — Ambulatory Visit (HOSPITAL_COMMUNITY)
Admission: RE | Admit: 2018-12-20 | Discharge: 2018-12-20 | Disposition: A | Discharge status: Home / Self Care | Payer: PPO | Source: Ambulatory Visit | Attending: Cardiology | Admitting: Cardiology

## 2018-12-20 ENCOUNTER — Other Ambulatory Visit: Payer: Self-pay | Admitting: Family Medicine

## 2018-12-20 DIAGNOSIS — R0989 Other specified symptoms and signs involving the circulatory and respiratory systems: Secondary | ICD-10-CM | POA: Diagnosis not present

## 2018-12-21 ENCOUNTER — Ambulatory Visit (HOSPITAL_COMMUNITY): Payer: PPO | Attending: Cardiovascular Disease

## 2018-12-21 DIAGNOSIS — R0789 Other chest pain: Secondary | ICD-10-CM | POA: Diagnosis not present

## 2018-12-21 DIAGNOSIS — I420 Dilated cardiomyopathy: Secondary | ICD-10-CM

## 2018-12-21 DIAGNOSIS — E785 Hyperlipidemia, unspecified: Secondary | ICD-10-CM | POA: Insufficient documentation

## 2018-12-21 DIAGNOSIS — I5040 Unspecified combined systolic (congestive) and diastolic (congestive) heart failure: Secondary | ICD-10-CM | POA: Diagnosis not present

## 2018-12-21 LAB — MYOCARDIAL PERFUSION IMAGING
LV dias vol: 81 mL (ref 46–106)
LV sys vol: 26 mL
Peak HR: 88 {beats}/min
Rest HR: 63 {beats}/min
SDS: 3
SRS: 2
SSS: 5
TID: 1.06

## 2018-12-21 MED ORDER — TECHNETIUM TC 99M TETROFOSMIN IV KIT
31.7000 | PACK | Freq: Once | INTRAVENOUS | Status: AC | PRN
  Administered 2018-12-21: 31.7 via INTRAVENOUS
  Filled 2018-12-21: qty 32

## 2018-12-21 MED ORDER — REGADENOSON 0.4 MG/5ML IV SOLN
0.4000 mg | Freq: Once | INTRAVENOUS | Status: AC
  Administered 2018-12-21: 0.4 mg via INTRAVENOUS

## 2018-12-21 MED ORDER — TECHNETIUM TC 99M TETROFOSMIN IV KIT
10.1000 | PACK | Freq: Once | INTRAVENOUS | Status: AC | PRN
  Administered 2018-12-21: 10.1 via INTRAVENOUS
  Filled 2018-12-21: qty 11

## 2018-12-22 ENCOUNTER — Ambulatory Visit: Payer: PPO | Admitting: Internal Medicine

## 2018-12-25 ENCOUNTER — Encounter (INDEPENDENT_AMBULATORY_CARE_PROVIDER_SITE_OTHER): Payer: Self-pay | Admitting: Physician Assistant

## 2018-12-25 ENCOUNTER — Ambulatory Visit (INDEPENDENT_AMBULATORY_CARE_PROVIDER_SITE_OTHER): Payer: PPO | Admitting: Physician Assistant

## 2018-12-25 ENCOUNTER — Telehealth: Payer: Self-pay | Admitting: Cardiovascular Disease

## 2018-12-25 VITALS — BP 146/74 | HR 74 | Temp 98.0°F | Ht 61.0 in | Wt 168.0 lb

## 2018-12-25 DIAGNOSIS — E7849 Other hyperlipidemia: Secondary | ICD-10-CM | POA: Diagnosis not present

## 2018-12-25 DIAGNOSIS — E669 Obesity, unspecified: Secondary | ICD-10-CM

## 2018-12-25 DIAGNOSIS — Z6831 Body mass index (BMI) 31.0-31.9, adult: Secondary | ICD-10-CM | POA: Diagnosis not present

## 2018-12-25 NOTE — Telephone Encounter (Signed)
Follow Up:        Returning your call from Friday,concerniing her Stress Test results.

## 2018-12-25 NOTE — Telephone Encounter (Signed)
Left second message for patient to call back. Sent results through [place].

## 2018-12-26 ENCOUNTER — Other Ambulatory Visit: Payer: Self-pay | Admitting: Internal Medicine

## 2018-12-26 ENCOUNTER — Encounter: Payer: Self-pay | Admitting: Internal Medicine

## 2018-12-26 ENCOUNTER — Ambulatory Visit (INDEPENDENT_AMBULATORY_CARE_PROVIDER_SITE_OTHER): Payer: PPO | Admitting: Internal Medicine

## 2018-12-26 DIAGNOSIS — J4520 Mild intermittent asthma, uncomplicated: Secondary | ICD-10-CM | POA: Diagnosis not present

## 2018-12-26 DIAGNOSIS — G4733 Obstructive sleep apnea (adult) (pediatric): Secondary | ICD-10-CM

## 2018-12-26 NOTE — Progress Notes (Signed)
Office: [phone]  /  Fax: [phone]   HPI:   Chief Complaint: OBESITY Jackie Stevens is here to discuss her progress with her obesity treatment plan. She is on the Category 2 plan and is following her eating plan approximately 75 % of the time. She states she is going to the gym and exercise classes 30 minutes 1 times per week. Jackie Stevens reports struggling with the plan over the holidays. She is not getting enough protein in throughout the day. She is ready to get back on track. Her weight is 168 lb (76.2 kg) today and has gained 3 lbs since her last visit. She has lost 36 lbs since starting treatment with [place].  Hyperlipidemia Jackie Stevens has hyperlipidemia and has been trying to improve her cholesterol levels with intensive lifestyle modification including a low saturated fat diet, exercise and weight loss. She she is on Zocor and denies any chest pain, claudication or myalgias.  ASSESSMENT AND PLAN:  Other hyperlipidemia  Class 1 obesity with serious comorbidity and body mass index (BMI) of 31.0 to 31.9 in adult, unspecified obesity type  PLAN:  Hyperlipidemia Jackie Stevens was informed of the American Heart Association Guidelines emphasizing intensive lifestyle modifications as the first line treatment for hyperlipidemia. We discussed many lifestyle modifications today in depth, and Jackie Stevens will continue to work on decreasing saturated fats such as fatty red meat, butter and many fried foods. She will also increase vegetables and lean protein in her diet and continue to work on exercise and weight loss efforts. She agrees to continue taking medications. Jackie Stevens agrees to follow up with our clinic in 2 weeks.  Obesity Jackie Stevens is currently in the action stage of change. As such, her goal is to continue with weight loss efforts She has agreed to follow the Category 2 plan Jackie Stevens has been instructed to work up to a goal of 150 minutes of combined cardio and strengthening exercise per week for weight loss  and overall health benefits. We discussed the following Behavioral Modification Strategies today: increasing lean protein intake and work on meal planning and easy cooking plans  Jackie Stevens has agreed to follow up with our clinic in 2 weeks. She was informed of the importance of frequent follow up visits to maximize her success with intensive lifestyle modifications for her multiple health conditions.  ALLERGIES: Allergies  Allergen Reactions  . Neomycin-Bacitracin Zn-Polymyx Rash    Polysporin- is tolerated   . Niacin Other (See Comments) and Cough    "cough til I threw up" (12/19/2012)  . Ciprofloxacin Hives    Got cipro and flagyl at same time, localized hives to IV arm  . Flagyl [Metronidazole] Hives    Got cipro and flagyl at same time, localized hives to IV arm    MEDICATIONS: Current Outpatient Medications on File Prior to Visit  Medication Sig Dispense Refill  . acetic acid-hydrocortisone (VOSOL-HC) OTIC solution PLACE 4 DROPS INTO THE RIGHT EAR 3 (THREE) TIMES DAILY.    Marland Kitchen amLODipine (NORVASC) 2.5 MG tablet Take 2.5 mg by mouth daily.    Marland Kitchen docusate sodium (COLACE) 100 MG capsule Take 100 mg by mouth 2 (two) times daily. Reported on 03/30/2016    . Fluocinolone Acetonide 0.01 % OIL PLACE 5 DROPS IN EAR(S) 2 TIMES DAILY FOR 14 DAYS.  2  . fluticasone (FLONASE) 50 MCG/ACT nasal spray Place 2 sprays into both nostrils daily. 48 g 3  . hydrochlorothiazide (HYDRODIURIL) 25 MG tablet TAKE 1 TABLET BY MOUTH EVERY DAY 90 tablet 1  .  hyoscyamine (LEVSIN SL) 0.125 MG SL tablet Place 1 tablet (0.125 mg total) under the tongue every 4 (four) hours as needed. 30 tablet 1  . KETOCONAZOLE, TOPICAL, 1 % SHAM Apply 1 Dose topically once a week. 200 mL 1  . levothyroxine (SYNTHROID, LEVOTHROID) 150 MCG tablet Take 150 mcg by mouth daily before breakfast.    . losartan (COZAAR) 100 MG tablet TAKE 1 TABLET BY MOUTH EVERY DAY 90 tablet 1  . Multiple Vitamins-Minerals (MULTIVITAMIN WOMEN 50+ PO) Take 1  tablet by mouth daily.    . naproxen (NAPROSYN) 375 MG tablet TAKE 1 TABLET (375 MG TOTAL) BY MOUTH 2 (TWO) TIMES DAILY WITH A MEAL. 180 tablet 1  . nitroGLYCERIN (NITROSTAT) 0.4 MG SL tablet PLACE 1 TABLET UNDER THE TONGUE EVERY 5 MINUTES AS NEEDED FOR CHEST PAIN 25 tablet 1  . omeprazole (PRILOSEC) 40 MG capsule TAKE 1 CAPSULE BY MOUTH TWICE A DAY 180 capsule 1  . Polyethyl Glycol-Propyl Glycol 0.4-0.3 % GEL Apply 1 drop to eye 2 (two) times daily as needed (dry eyes).     . ranitidine (ZANTAC) 300 MG tablet TAKE 1 TABLET BY MOUTH EVERYDAY AT BEDTIME 90 tablet 1  . simvastatin (ZOCOR) 40 MG tablet TAKE 1 TABLET BY MOUTH EVERYDAY AT BEDTIME 90 tablet 0  . sodium chloride (OCEAN) 0.65 % SOLN nasal spray Place 1 spray into both nostrils 2 (two) times daily as needed for congestion.     . TURMERIC PO Take 1 tablet by mouth daily. Reported on 12/19/2015    . Vitamin D, Cholecalciferol, 1000 UNITS CAPS Take 1 capsule by mouth daily.     No current facility-administered medications on file prior to visit.     PAST MEDICAL HISTORY: Past Medical History:  Diagnosis Date  . Abnormal cervical cytology 10/25/2012   Follows with Dr Leavy Cella of Gyn  . Allergic rhinitis   . Anemia 10/06/2013  . Anginal pain ([place])    pt has history of CP states had cardiac workup with no specific issues identified   . Anxiety    when increased stress   . Arthritis    "knees; right thumb; shoulders" (01/05/13)  . Asthma   . Chest pain   . Chicken pox as a child  . Complication of anesthesia   . Constipation   . Decreased hearing   . Dermatitis 03/06/2017  . Diverticulitis 10/25/2012   pt. reports that a drain was placed - 09/2012    . Dry mouth   . Excessive thirst   . External hemorrhoid, bleeding    "sometimes" (01-05-13)  . Frequent urination   . GERD (gastroesophageal reflux disease)   . H/O hiatal hernia   . Hand tingling   . Heart murmur   . HTN (hypertension)    stress test completed by  Anselm Lis, diagnosed as GERD  . Hyperlipidemia   . Hypothyroidism   . Incontinence   . Infertility, female   . Insomnia   . Joint pain   . Kidney stones 1970's   "passed on their own" (01-05-2013)  . Knee pain   . Left shoulder pain 09/14/2016  . Low back pain 06/09/2014  . Measles as a child  . Medicare annual wellness visit, subsequent 10/06/2013   Steinhoffer of Dermatology Pneumovax in 2012   . Memory difficulties 09/05/2017  . Obesity 09/11/2017  . OSA on CPAP   . Palpitations   . Paresthesia 03/23/2015   Left face  . PONV (postoperative nausea and  vomiting)   . Preventative health care 10/06/2013   Steinhoffer of Dermatology Pneumovax in 2012  . Preventative health care 02/26/2016  . Rheumatoid arteritis ([place])   . Shortness of breath dyspnea    using stairs  . Sinus pain   . Sleep apnea   . Swelling of both lower extremities   . Thyroid cancer ([place]) 1980's  . Thyroid disease   . Tinnitus   . UTI (urinary tract infection) 04/02/2013    PAST SURGICAL HISTORY: Past Surgical History:  Procedure Laterality Date  . CHOLECYSTECTOMY  1990  . COLON SURGERY    . COLOSTOMY REVISION  12/19/2012   Procedure: COLON RESECTION SIGMOID;  Surgeon: Gwenyth Ober, MD;  Location: [place];  Service: General;  Laterality: N/A;  . CYSTOSCOPY WITH STENT PLACEMENT  12/19/2012   Procedure: CYSTOSCOPY WITH STENT PLACEMENT;  Surgeon: Hanley Ben, MD;  Location: [place];  Service: Urology;  Laterality: N/A;  . DILATION AND CURETTAGE OF UTERUS  1960's   "lots of them; had miscarriages" (12/19/2012)  . LYSIS OF ADHESION N/A 12/02/2015   Procedure: LAPAROSCOPIC LYSIS OF ADHESION;  Surgeon: Johnathan Hausen, MD;  Location: [facility];  Service: General;  Laterality: N/A;  . ROBOTIC ASSISTED BILATERAL SALPINGO OOPHERECTOMY Bilateral 12/02/2015   Procedure: XI ROBOTIC ASSISTED BILATERAL SALPINGO OOPHORECTOMY;  Surgeon: Everitt Amber, MD;  Location: [facility];  Service: Gynecology;  Laterality: Bilateral;  . SIGMOID  RESECTION / RECTOPEXY  12/19/2012  . THYROIDECTOMY, PARTIAL  1988   "then did iodine to remove the rest" (12/19/2012)  . TONSILLECTOMY  1951?  . TRANSRECTAL DRAINAGE OF PELVIC ABSCESS  10/27/2012  . VAGINAL HYSTERECTOMY  1970's   "still have my ovaries" (12/19/2012)    SOCIAL HISTORY: Social History   Tobacco Use  . Smoking status: Never Smoker  . Smokeless tobacco: Never Used  Substance Use Topics  . Alcohol use: No    Alcohol/week: 0.0 standard drinks  . Drug use: No    FAMILY HISTORY: Family History  Problem Relation Age of Onset  . Heart disease Father   . Pneumonia Father   . Hypertension Father   . Hyperlipidemia Father   . Cancer Father        skin  . Stroke Father   . Alzheimer's disease Mother   . Heart disease Mother   . Depression Mother   . Emphysema Brother        marijuana and cigarettes  . Alcohol abuse Brother   . Hearing loss Brother   . Diabetes Maternal Grandmother   . Alzheimer's disease Paternal Grandmother   . Cancer Paternal Grandmother        lung?- smoker  . Hyperlipidemia Paternal Grandmother   . Heart attack Paternal Grandfather   . Alcohol abuse Paternal Grandfather   . Neurofibromatosis Son        schwanomatosis  . Neurofibromatosis Son        swanomatosis  . Cancer Paternal Aunt     ROS: Review of Systems  Constitutional: Negative for weight loss.  Cardiovascular: Negative for chest pain and claudication.  Musculoskeletal: Negative for myalgias.    PHYSICAL EXAM: Blood pressure (!) 146/74, pulse 74, temperature 98 F (36.7 C), temperature source Oral, height 5\' 1"  (1.549 m), weight 168 lb (76.2 kg), SpO2 99 %. Body mass index is 31.74 kg/m. Physical Exam Vitals signs reviewed.  Constitutional:      Appearance: Normal appearance. She is obese.  Cardiovascular:     Rate and Rhythm: Normal rate.  Pulses: Normal pulses.  Pulmonary:     Effort: Pulmonary effort is normal.  Musculoskeletal: Normal range of motion.  Skin:     General: Skin is warm and dry.  Neurological:     Mental Status: She is alert and oriented to person, place, and time.  Psychiatric:        Mood and Affect: Mood normal.        Behavior: Behavior normal.     RECENT LABS AND TESTS: BMET    Component Value Date/Time   NA 134 (L) 11/07/2018 1758   NA 137 07/04/2018 1035   K 3.7 11/07/2018 1758   CL 94 (L) 11/07/2018 1758   CO2 30 11/07/2018 1758   GLUCOSE 110 (H) 11/07/2018 1758   BUN 18 11/07/2018 1758   BUN 18 07/04/2018 1035   CREATININE 0.66 11/07/2018 1758   CREATININE 0.75 05/28/2014 0836   CALCIUM 9.0 11/07/2018 1758   GFRNONAA >60 10/26/2018 2044   GFRAA >60 10/26/2018 2044   Lab Results  Component Value Date   HGBA1C 5.6 11/07/2018   HGBA1C 5.6 07/04/2018   HGBA1C 5.8 (H) 07/18/2011   Lab Results  Component Value Date   INSULIN 6.4 07/04/2018   INSULIN 7.9 02/28/2018   CBC    Component Value Date/Time   WBC 8.7 11/07/2018 1758   RBC 4.55 11/07/2018 1758   HGB 13.2 11/07/2018 1758   HCT 39.3 11/07/2018 1758   PLT 319.0 11/07/2018 1758   MCV 86.5 11/07/2018 1758   MCH 28.4 10/26/2018 2044   MCHC 33.7 11/07/2018 1758   RDW 14.3 11/07/2018 1758   LYMPHSABS 2.2 11/07/2018 1758   MONOABS 0.9 11/07/2018 1758   EOSABS 0.2 11/07/2018 1758   BASOSABS 0.1 11/07/2018 1758   Iron/TIBC/Ferritin/ %Sat    Component Value Date/Time   IRON 33 09/15/2015 0618   TIBC 249 (L) 09/15/2015 0618   FERRITIN 52 09/15/2015 0618   IRONPCTSAT 13 09/15/2015 0618   IRONPCTSAT 22 07/02/2013 1056   Lipid Panel     Component Value Date/Time   CHOL 154 11/07/2018 1758   TRIG 78.0 11/07/2018 1758   HDL 57.70 11/07/2018 1758   CHOLHDL 3 11/07/2018 1758   VLDL 15.6 11/07/2018 1758   LDLCALC 81 11/07/2018 1758   Hepatic Function Panel     Component Value Date/Time   PROT 7.1 11/07/2018 1758   PROT 6.3 07/04/2018 1035   ALBUMIN 4.6 11/07/2018 1758   ALBUMIN 4.2 07/04/2018 1035   AST 19 11/07/2018 1758   ALT 17  11/07/2018 1758   ALKPHOS 81 11/07/2018 1758   BILITOT 0.3 11/07/2018 1758   BILITOT 0.3 07/04/2018 1035   BILIDIR 0.0 11/01/2014 1116   IBILI 0.3 05/28/2014 0836      Component Value Date/Time   TSH 0.88 11/07/2018 1758   TSH 1.570 07/04/2018 1035   TSH 0.49 02/24/2018 0829      OBESITY BEHAVIORAL INTERVENTION VISIT  Today's visit was # 16   Starting weight: 204 lbs Starting date: 02/28/2018 Today's weight :: 168 lb  Today's date: 12/26/2018 Total lbs lost to date: 36  At least 15 minutes were spent on discussing the following behavioral intervention visit.  ASK: We discussed the diagnosis of obesity with Jackie Stevens today and Jackie Stevens agreed to give [place] permission to discuss obesity behavioral modification therapy today.  ASSESS: Jackie Stevens has the diagnosis of obesity and her BMI today is 31.76 Jackie Stevens is in the action stage of change   ADVISE: Jackie Stevens was educated  on the multiple health risks of obesity as well as the benefit of weight loss to improve her health. She was advised of the need for long term treatment and the importance of lifestyle modifications to improve her current health and to decrease her risk of future health problems.  AGREE: Multiple dietary modification options and treatment options were discussed and  Jackie Stevens agreed to follow the recommendations documented in the above note.  ARRANGE: Jackie Stevens was educated on the importance of frequent visits to treat obesity as outlined per CMS and USPSTF guidelines and agreed to schedule her next follow up appointment today.  I, Tammy Wysor, am acting as Location manager for [employer], PA-C I, Abby Potash, PA-C have reviewed above note and agree with its content

## 2018-12-26 NOTE — Progress Notes (Signed)
Subjective:    Patient ID: Jackie Stevens, female    DOB: 1946-04-08, 73 y.o.   MRN: [id number]  HPI followed for obstructive sleep apnea complicated by GERD, HBP, allergic rhinitis, obesity NPSG  07/15/10--AHI 13.5/hour, desaturation to 87%, body weight 215 pounds PSG   --------------------------------------------------------------------------------- 12/22/17-73 year old female never smoker followed for OSA, complicated by chronic cough, GERD, HBP, allergic rhinitis CPAP 10/APS ----OSA; DME APS. Pt wears CPAP nightly; was never given a SD card back from DME.  Discussed alternatives to CPAP.  She had questions about appliances and the [facility] surgical procedure. No download is available this visit but she reports using CPAP all night every night and sleeping better with it.  12/26/2018- 73 year old female never smoker followed for OSA, complicated by chronic cough, GERD, HBP, allergic rhinitis CPAP 10/APS -----OSA: DME APS. Pt wears CPAP nightly but not able to get DL today-not in [facility] as she has had machine several years and no SD Card.  Body weight today 172 pounds She reports doing very well with CPAP, using it all night every night.  Admits she finds it annoying so we talked about alternatives, especially oral appliances.  Machine may be eligible for replacement but we will make that decision after she learns more about oral appliance alternative. CXR 10/26/2018- IMPRESSION: Minimal right basilar atelectasis noted. Lungs otherwise clear.  Review of Systems-see HPI + = positive Constitutional:   No-   weight loss, night sweats, fevers, chills, fatigue, lassitude. HEENT:   No-  headaches, difficulty swallowing, tooth/dental problems, sore throat,         No-sneezing, itching, ear ache,no- nasal congestion, post nasal drip,  CV:  No-   chest pain, orthopnea, PND, swelling in lower extremities, anasarca, dizziness, palpitations Resp: No-   shortness of breath with exertion or at rest.              No-   productive cough,   non-productive cough,  No-  coughing up of blood.              No-   change in color of mucus.  No- wheezing.   Skin: No-   rash or lesions. GI:  No-   heartburn, indigestion, abdominal pain, nausea, vomiting,  GU:  MS:  No-   joint pain or swelling.   Neuro- nothing unusual Psych:  No- change in mood or affect. No depression or anxiety.  No memory loss.   Objective:   Physical Exam General- Alert, Oriented, Affect-appropriate, Distress- none acute, + Morbidly obese Skin- rash-none, lesions- none, excoriation- none Lymphadenopathy- none Head- atraumatic            Eyes- Gross vision intact, PERRLA, conjunctivae clear secretions            Ears- Hearing, canals slightly retracted, no fluid            Nose- Clear, No-Septal dev, mucus, polyps, erosion, perforation             Throat- Mallampati III-IV , mucosa clear- not red , drainage- none, tonsils- atrophic, not hoarse Neck- flexible , trachea midline, no stridor , thyroid nl, carotid no bruit Chest - symmetrical excursion , unlabored           Heart/CV- RRR , murmur+ 1/6 LUSB , no gallop  , no rub, nl s1 s2                           - JVD- none ,  edema- none, stasis changes- none, varices- none           Lung- clear to P&A, wheeze- none, cough- none , dullness-none, rub- none           Chest wall-  Abd-  Br/ Gen/ Rectal- Not done, not indicated Extrem- cyanosis- none, clubbing, none, atrophy- none, strength- nl Neuro- grossly intact to observation   Assessment & Plan:

## 2018-12-26 NOTE — Patient Instructions (Signed)
APS - please replace missing download card. When is she eligible for replacement of old machine?  Order- referral to Dr Oneal Grout, Orthodontist   Consider oral appliance for OSA  Please call if we can help

## 2018-12-26 NOTE — Assessment & Plan Note (Signed)
Well-controlled reporting no recent exacerbation.

## 2018-12-26 NOTE — Assessment & Plan Note (Signed)
Good compliance and control by report.  She is interested in alternatives but willing to continue CPAP if necessary.  She does benefit from CPAP with improved sleep. Plan-referred to learn about oral appliances to treat OSA as alternative to CPAP.

## 2018-12-29 ENCOUNTER — Other Ambulatory Visit: Payer: Self-pay

## 2018-12-29 NOTE — Patient Outreach (Signed)
[facility]) Care Management  12/29/2018  Jackie Stevens 1946-08-16 [id number]    2nd attempt to outreach the patient for initial assessment.  No answer.  HIPAA compliant voicemail left with contact information.  Plan: RN Health Coach will send letter. [place] will make outreach attempt the patient within thirty days.  Lazaro Arms RN, BSN, [place] Direct Dial:  [phone]  Fax: [phone]

## 2019-01-01 DIAGNOSIS — G4733 Obstructive sleep apnea (adult) (pediatric): Secondary | ICD-10-CM | POA: Diagnosis not present

## 2019-01-03 ENCOUNTER — Other Ambulatory Visit: Payer: Self-pay | Admitting: Family Medicine

## 2019-01-08 ENCOUNTER — Ambulatory Visit (INDEPENDENT_AMBULATORY_CARE_PROVIDER_SITE_OTHER): Payer: PPO | Admitting: Internal Medicine

## 2019-01-08 ENCOUNTER — Encounter (INDEPENDENT_AMBULATORY_CARE_PROVIDER_SITE_OTHER): Payer: Self-pay

## 2019-01-08 ENCOUNTER — Encounter: Payer: Self-pay | Admitting: Internal Medicine

## 2019-01-08 ENCOUNTER — Ambulatory Visit (INDEPENDENT_AMBULATORY_CARE_PROVIDER_SITE_OTHER): Payer: PPO | Admitting: Physician Assistant

## 2019-01-08 VITALS — BP 126/72 | HR 71 | Temp 98.1°F | Resp 16 | Ht 62.0 in | Wt 170.2 lb

## 2019-01-08 DIAGNOSIS — B349 Viral infection, unspecified: Secondary | ICD-10-CM

## 2019-01-08 DIAGNOSIS — R05 Cough: Secondary | ICD-10-CM

## 2019-01-08 DIAGNOSIS — R059 Cough, unspecified: Secondary | ICD-10-CM

## 2019-01-08 LAB — POCT INFLUENZA A/B
Influenza A, POC: NEGATIVE
Influenza B, POC: NEGATIVE

## 2019-01-08 MED ORDER — AZELASTINE HCL 0.1 % NA SOLN: 2.0000 | Freq: Every evening | NASAL | 3 refills | Status: DC | PRN

## 2019-01-08 NOTE — Patient Instructions (Signed)
Rest, fluids , tylenol  For cough:  Take Mucinex DM or similar OTC as needed until better  For nasal congestion: Use OTC Nasocort or Flonase : 2 nasal sprays on each side of the nose in the morning until you feel better ASTELIN: two  nasal spray on each side of the nose at night   Avoid decongestants such as  Pseudoephedrine or phenylephrine    Call if not gradually better over the next  10 days  Call anytime if the symptoms are severe, you have high fever, short of breath, chest pain

## 2019-01-08 NOTE — Progress Notes (Signed)
Pre visit review using our clinic review tool, if applicable. No additional management support is needed unless otherwise documented below in the visit note. 

## 2019-01-08 NOTE — Progress Notes (Signed)
Subjective:    Patient ID: Jackie Stevens, female    DOB: 02-14-1946, 73 y.o.   MRN: [id number]  DOS:  01/08/2019 Type of visit - description:  [place] Symptoms started 3 days ago: runny nose and cough.  No sputum production.  Feeling fatigued and tired. It is hard for her to sleep because the nasal congestion, she has been sleeping in her recliner ; has not been able to use her CPAP.  Has not taking medications for her symptoms Her son has a persistent viral syndrome.  No details available.  Review of Systems Denies fever chills Admits to sinus pain and congestion with mild amount of clear nasal discharge. No myalgias No chest pain no difficulty breathing  Past Medical History:  Diagnosis Date  . Abnormal cervical cytology 10/25/2012   Follows with Dr Leavy Cella of Gyn  . Allergic rhinitis   . Anemia 10/06/2013  . Anginal pain ([place])    pt has history of CP states had cardiac workup with no specific issues identified   . Anxiety    when increased stress   . Arthritis    "knees; right thumb; shoulders" (12/21/12)  . Asthma   . Chest pain   . Chicken pox as a child  . Complication of anesthesia   . Constipation   . Decreased hearing   . Dermatitis 03/06/2017  . Diverticulitis 10/25/2012   pt. reports that a drain was placed - 09/2012    . Dry mouth   . Excessive thirst   . External hemorrhoid, bleeding    "sometimes" (12/21/2012)  . Frequent urination   . GERD (gastroesophageal reflux disease)   . H/O hiatal hernia   . Hand tingling   . Heart murmur   . HTN (hypertension)    stress test completed by Anselm Lis, diagnosed as GERD  . Hyperlipidemia   . Hypothyroidism   . Incontinence   . Infertility, female   . Insomnia   . Joint pain   . Kidney stones 1970's   "passed on their own" (12-21-12)  . Knee pain   . Left shoulder pain 09/14/2016  . Low back pain 06/09/2014  . Measles as a child  . Medicare annual wellness visit, subsequent 10/06/2013   Steinhoffer  of Dermatology Pneumovax in 2012   . Memory difficulties 09/05/2017  . Obesity 09/11/2017  . OSA on CPAP   . Palpitations   . Paresthesia 03/23/2015   Left face  . PONV (postoperative nausea and vomiting)   . Preventative health care 10/06/2013   Ste[facility]ffer of Dermatology Pneumovax in 2012  . Preventative health care 02/26/2016  . Rheumatoid arteritis ([place])   . Shortness of breath dyspnea    using stairs  . Sinus pain   . Sleep apnea   . Swelling of both lower extremities   . Thyroid cancer ([place]) 1980's  . Thyroid disease   . Tinnitus   . UTI (urinary tract infection) 04/02/2013    Past Surgical History:  Procedure Laterality Date  . CHOLECYSTECTOMY  1990  . COLON SURGERY    . COLOSTOMY REVISION  2012-12-21   Procedure: COLON RESECTION SIGMOID;  Surgeon: Gwenyth Ober, MD;  Location: [facility];  Service: General;  Laterality: N/A;  . CYSTOSCOPY WITH STENT PLACEMENT  2012-12-21   Procedure: CYSTOSCOPY WITH STENT PLACEMENT;  Surgeon: Hanley Ben, MD;  Location: [facility];  Service: Urology;  Laterality: N/A;  . DILATION AND CURETTAGE OF UTERUS  1960's   "lots of them;  had miscarriages" (12/19/2012)  . LYSIS OF ADHESION N/A 12/02/2015   Procedure: LAPAROSCOPIC LYSIS OF ADHESION;  Surgeon: Johnathan Hausen, MD;  Location: [facility];  Service: General;  Laterality: N/A;  . ROBOTIC ASSISTED BILATERAL SALPINGO OOPHERECTOMY Bilateral 12/02/2015   Procedure: XI ROBOTIC ASSISTED BILATERAL SALPINGO OOPHORECTOMY;  Surgeon: Everitt Amber, MD;  Location: [facility];  Service: Gynecology;  Laterality: Bilateral;  . SIGMOID RESECTION / RECTOPEXY  12/19/2012  . THYROIDECTOMY, PARTIAL  1988   "then did iodine to remove the rest" (12/19/2012)  . TONSILLECTOMY  1951?  . TRANSRECTAL DRAINAGE OF PELVIC ABSCESS  10/27/2012  . VAGINAL HYSTERECTOMY  1970's   "still have my ovaries" (12/19/2012)    Social History   Socioeconomic History  . Marital status: Married    Spouse name: [name]  . Number of children: 2  . Years of  education: Not on file  . Highest education level: Not on file  Occupational History  . Occupation: Retired  Scientific laboratory technician  . Financial resource strain: Not on file  . Food insecurity:    Worry: Not on file    Inability: Not on file  . Transportation needs:    Medical: Not on file    Non-medical: Not on file  Tobacco Use  . Smoking status: Never Smoker  . Smokeless tobacco: Never Used  Substance and Sexual Activity  . Alcohol use: No    Alcohol/week: 0.0 standard drinks  . Drug use: No  . Sexual activity: Not Currently  Lifestyle  . Physical activity:    Days per week: Not on file    Minutes per session: Not on file  . Stress: Not on file  Relationships  . Social connections:    Talks on phone: Not on file    Gets together: Not on file    Attends religious service: Not on file    Active member of club or organization: Not on file    Attends meetings of clubs or organizations: Not on file    Relationship status: Not on file  . Intimate partner violence:    Fear of current or ex partner: Not on file    Emotionally abused: Not on file    Physically abused: Not on file    Forced sexual activity: Not on file  Other Topics Concern  . Not on file  Social History Narrative   Married    children      Allergies as of 01/08/2019      Reactions   Neomycin-bacitracin Zn-polymyx Rash   Polysporin- is tolerated    Niacin Other (See Comments), Cough   "cough til I threw up" (12/19/2012)   Ciprofloxacin Hives   Got cipro and flagyl at same time, localized hives to IV arm   Flagyl [metronidazole] Hives   Got cipro and flagyl at same time, localized hives to IV arm      Medication List       Accurate as of January 08, 2019 11:37 AM. Always use your most recent med list.        acetic acid-hydrocortisone OTIC solution Commonly known as:  VOSOL-HC PLACE 4 DROPS INTO THE RIGHT EAR 3 (THREE) TIMES DAILY.   amLODipine 2.5 MG tablet Commonly known as:  NORVASC Take 2.5 mg by  mouth daily.   docusate sodium 100 MG capsule Commonly known as:  COLACE Take 100 mg by mouth 2 (two) times daily. Reported on 03/30/2016   Fluocinolone Acetonide 0.01 % Oil PLACE 5 DROPS IN EAR(S) 2  TIMES DAILY FOR 14 DAYS.   fluticasone 50 MCG/ACT nasal spray Commonly known as:  FLONASE Place 2 sprays into both nostrils daily.   hydrochlorothiazide 25 MG tablet Commonly known as:  HYDRODIURIL TAKE 1 TABLET BY MOUTH EVERY DAY   hyoscyamine 0.125 MG SL tablet Commonly known as:  LEVSIN SL Place 1 tablet (0.125 mg total) under the tongue every 4 (four) hours as needed.   KETOCONAZOLE (TOPICAL) 1 % Sham Apply 1 Dose topically once a week.   levothyroxine 150 MCG tablet Commonly known as:  SYNTHROID, LEVOTHROID Take 150 mcg by mouth daily before breakfast.   losartan 100 MG tablet Commonly known as:  COZAAR TAKE 1 TABLET BY MOUTH EVERY DAY   MULTIVITAMIN WOMEN 50+ PO Take 1 tablet by mouth daily.   naproxen 375 MG tablet Commonly known as:  NAPROSYN TAKE 1 TABLET (375 MG TOTAL) BY MOUTH 2 (TWO) TIMES DAILY WITH A MEAL.   nitroGLYCERIN 0.4 MG SL tablet Commonly known as:  NITROSTAT PLACE 1 TABLET UNDER THE TONGUE EVERY 5 MINUTES AS NEEDED FOR CHEST PAIN   omeprazole 40 MG capsule Commonly known as:  PRILOSEC TAKE 1 CAPSULE BY MOUTH TWICE A DAY   Polyethyl Glycol-Propyl Glycol 0.4-0.3 % Gel ophthalmic gel Commonly known as:  SYSTANE Apply 1 drop to eye 2 (two) times daily as needed (dry eyes).   ranitidine 300 MG tablet Commonly known as:  ZANTAC TAKE 1 TABLET BY MOUTH EVERYDAY AT BEDTIME   simvastatin 40 MG tablet Commonly known as:  ZOCOR TAKE 1 TABLET BY MOUTH EVERYDAY AT BEDTIME   sodium chloride 0.65 % Soln nasal spray Commonly known as:  OCEAN Place 1 spray into both nostrils 2 (two) times daily as needed for congestion.   TURMERIC PO Take 1 tablet by mouth daily. Reported on 12/19/2015   Vitamin D (Cholecalciferol) 25 MCG (1000 UT) Caps Take 1 capsule  by mouth daily.           Objective:   Physical Exam BP 126/72 (BP Location: Left Arm, Patient Position: Sitting, Cuff Size: Normal)   Pulse 71   Temp 98.1 F (36.7 C) (Oral)   Resp 16   Ht 5\' 2"  (1.575 m)   Wt 170 lb 4 oz (77.2 kg)   SpO2 97%   BMI 31.14 kg/m  General:   Well developed, NAD, BMI noted.  Well-appearing HEENT:  Normocephalic . Face symmetric, atraumatic.  TMs normal, nose congested, throat symmetric and not red. Lungs:  CTA B Normal respiratory effort, no intercostal retractions, no accessory muscle use. Heart: RRR,  no murmur.  No pretibial edema bilaterally  Skin: Not pale. Not jaundice Neurologic:  alert & oriented X3.  Speech normal, gait appropriate for age and unassisted Psych--  Cognition and judgment appear intact.  Cooperative with normal attention span and concentration.  Behavior appropriate. No anxious or depressed appearing.      Assessment    73 year old female, PMH includes HTN, hyperlipidemia, hypothyroidism, OSA, GERD Presents with:  Viral syndrome: The patient has a viral syndrome or a URI. She is afebrile, nontoxic-appearing, had a flu shot this season, flu test was negative. Recommend supportive treatment with fluids, Tylenol, Mucinex DM, Flonase, Astelin.  Call if not better, see AVS

## 2019-01-09 ENCOUNTER — Ambulatory Visit: Payer: PPO | Admitting: Family Medicine

## 2019-01-16 ENCOUNTER — Other Ambulatory Visit: Payer: Self-pay | Admitting: Family Medicine

## 2019-01-29 ENCOUNTER — Encounter: Payer: Self-pay | Admitting: Podiatry

## 2019-01-29 ENCOUNTER — Ambulatory Visit: Payer: PPO | Admitting: Podiatry

## 2019-01-29 DIAGNOSIS — L84 Corns and callosities: Secondary | ICD-10-CM

## 2019-01-29 DIAGNOSIS — M2012 Hallux valgus (acquired), left foot: Secondary | ICD-10-CM

## 2019-01-29 DIAGNOSIS — M79676 Pain in unspecified toe(s): Secondary | ICD-10-CM | POA: Diagnosis not present

## 2019-01-29 DIAGNOSIS — M2011 Hallux valgus (acquired), right foot: Secondary | ICD-10-CM

## 2019-01-29 DIAGNOSIS — M2041 Other hammer toe(s) (acquired), right foot: Secondary | ICD-10-CM

## 2019-01-29 DIAGNOSIS — M2042 Other hammer toe(s) (acquired), left foot: Secondary | ICD-10-CM

## 2019-01-29 DIAGNOSIS — B351 Tinea unguium: Secondary | ICD-10-CM | POA: Diagnosis not present

## 2019-01-29 NOTE — Progress Notes (Signed)
Subjective: Jackie Stevens presents today with painful, thick toenails 1-5 b/l that she cannot cut and which interfere with daily activities.  Pain is aggravated when wearing enclosed shoe gear.  Patient also complains of painful plantar calluses bilaterally, right greater than left.  Mosie Lukes, MD is her PCP.   Current Outpatient Medications:  .  acetic acid-hydrocortisone (VOSOL-HC) OTIC solution, PLACE 4 DROPS INTO THE RIGHT EAR 3 (THREE) TIMES DAILY., Disp: , Rfl:  .  amLODipine (NORVASC) 2.5 MG tablet, Take 2.5 mg by mouth daily., Disp: , Rfl:  .  azelastine (ASTELIN) 0.1 % nasal spray, Place 2 sprays into both nostrils at bedtime as needed for rhinitis. Use in each nostril as directed, Disp: 30 mL, Rfl: 3 .  docusate sodium (COLACE) 100 MG capsule, Take 100 mg by mouth 2 (two) times daily. Reported on 03/30/2016, Disp: , Rfl:  .  Fluocinolone Acetonide 0.01 % OIL, PLACE 5 DROPS IN EAR(S) 2 TIMES DAILY FOR 14 DAYS., Disp: , Rfl: 2 .  fluticasone (FLONASE) 50 MCG/ACT nasal spray, Place 2 sprays into both nostrils daily., Disp: 48 g, Rfl: 3 .  hydrochlorothiazide (HYDRODIURIL) 25 MG tablet, TAKE 1 TABLET BY MOUTH EVERY DAY, Disp: 90 tablet, Rfl: 1 .  hyoscyamine (LEVSIN SL) 0.125 MG SL tablet, Place 1 tablet (0.125 mg total) under the tongue every 4 (four) hours as needed., Disp: 30 tablet, Rfl: 1 .  KETOCONAZOLE, TOPICAL, 1 % SHAM, Apply 1 Dose topically once a week., Disp: 200 mL, Rfl: 1 .  levothyroxine (SYNTHROID, LEVOTHROID) 150 MCG tablet, Take 150 mcg by mouth daily before breakfast., Disp: , Rfl:  .  losartan (COZAAR) 100 MG tablet, TAKE 1 TABLET BY MOUTH EVERY DAY, Disp: 90 tablet, Rfl: 1 .  Multiple Vitamins-Minerals (MULTIVITAMIN WOMEN 50+ PO), Take 1 tablet by mouth daily., Disp: , Rfl:  .  naproxen (NAPROSYN) 375 MG tablet, TAKE 1 TABLET (375 MG TOTAL) BY MOUTH 2 (TWO) TIMES DAILY WITH A MEAL., Disp: 180 tablet, Rfl: 1 .  nitroGLYCERIN (NITROSTAT) 0.4 MG SL tablet, PLACE 1  TABLET UNDER THE TONGUE EVERY 5 MINUTES AS NEEDED FOR CHEST PAIN (Patient not taking: Reported on 01/08/2019), Disp: 25 tablet, Rfl: 1 .  omeprazole (PRILOSEC) 40 MG capsule, TAKE 1 CAPSULE BY MOUTH TWICE A DAY, Disp: 180 capsule, Rfl: 1 .  Polyethyl Glycol-Propyl Glycol 0.4-0.3 % GEL, Apply 1 drop to eye 2 (two) times daily as needed (dry eyes). , Disp: , Rfl:  .  ranitidine (ZANTAC) 300 MG tablet, TAKE 1 TABLET BY MOUTH EVERYDAY AT BEDTIME, Disp: 90 tablet, Rfl: 1 .  simvastatin (ZOCOR) 40 MG tablet, TAKE 1 TABLET BY MOUTH EVERYDAY AT BEDTIME, Disp: 90 tablet, Rfl: 0 .  sodium chloride (OCEAN) 0.65 % SOLN nasal spray, Place 1 spray into both nostrils 2 (two) times daily as needed for congestion. , Disp: , Rfl:  .  TURMERIC PO, Take 1 tablet by mouth daily. Reported on 12/19/2015, Disp: , Rfl:  .  Vitamin D, Cholecalciferol, 1000 UNITS CAPS, Take 1 capsule by mouth daily., Disp: , Rfl:   Allergies  Allergen Reactions  . Neomycin-Bacitracin Zn-Polymyx Rash    Polysporin- is tolerated   . Niacin Other (See Comments) and Cough    "cough til I threw up" (12/19/2012)  . Ciprofloxacin Hives    Got cipro and flagyl at same time, localized hives to IV arm  . Flagyl [Metronidazole] Hives    Got cipro and flagyl at same time, localized hives to  IV arm    Objective:  Vascular Examination: Capillary refill time less than 3 seconds x 10 digits  Dorsalis pedis and Posterior tibial pulses palpable b/l  Digital hair present x 10 digits  Skin temperature gradient WNL b/l  Dermatological Examination: Skin with normal turgor, texture and tone b/l  Toenails 1-5 b/l discolored, thick, dystrophic with subungual debris and pain with palpation to nailbeds due to thickness of nails.  Painful porokeratotic lesions noted submetatarsal head 2 right foot, submetatarsal head 3 left foot, distal aspect of the left second digit.  There is no erythema, no edema, no drainage, no flocculence  noted.  Musculoskeletal: Muscle strength 5/5 to all LE muscle groups  Hallux abductovalgus with bunion deformity bilaterally.  Overlapping hammertoe deformity second digit bilaterally  No pain, crepitus or joint limitation noted with ROM.   Neurological: Sensation intact with 10 gram monofilament.  Vibratory sensation intact.  Assessment: 1. Painful onychomycosis toenails 1-5 b/l  2. Porokeratosis submetatarsal head 2 right foot, submetatarsal head 3 left foot, distal tip of left second digit. 3. HAV with bunion deformity b/l 4. Hammertoe 2nd digit b/l  Plan: 1. Toenails 1-5 b/l were debrided in length and girth without iatrogenic bleeding. 2. Porokeratotic lesions pared and enucleated submetatarsal head 2 right foot, submetatarsal head 3 left foot, distal tip left second digit without incident. 3. Patient to continue soft, supportive shoe gear.  Patient to report any pedal injuries to medical professional immediately. 4. We will change her follow-up to 10 weeks given her symptoms on today.   5. Patient/POA to call should there be a concern in the interim.

## 2019-01-29 NOTE — Patient Instructions (Signed)
Onychomycosis/Fungal Toenails  WHAT IS IT? An infection that lies within the keratin of your nail plate that is caused by a fungus.  WHY ME? Fungal infections affect all ages, sexes, races, and creeds.  There may be many factors that predispose you to a fungal infection such as age, coexisting medical conditions such as diabetes, or an autoimmune disease; stress, medications, fatigue, genetics, etc.  Bottom line: fungus thrives in a warm, moist environment and your shoes offer such a location.  IS IT CONTAGIOUS? Theoretically, yes.  You do not want to share shoes, nail clippers or files with someone who has fungal toenails.  Walking around barefoot in the same room or sleeping in the same bed is unlikely to transfer the organism.  It is important to realize, however, that fungus can spread easily from one nail to the next on the same foot.  HOW DO WE TREAT THIS?  There are several ways to treat this condition.  Treatment may depend on many factors such as age, medications, pregnancy, liver and kidney conditions, etc.  It is best to ask your doctor which options are available to you.  1. No treatment.   Unlike many other medical concerns, you can live with this condition.  However for many people this can be a painful condition and may lead to ingrown toenails or a bacterial infection.  It is recommended that you keep the nails cut short to help reduce the amount of fungal nail. 2. Topical treatment.  These range from herbal remedies to prescription strength nail lacquers.  About 40-50% effective, topicals require twice daily application for approximately 9 to 12 months or until an entirely new nail has grown out.  The most effective topicals are medical grade medications available through physicians offices. 3. Oral antifungal medications.  With an 80-90% cure rate, the most common oral medication requires 3 to 4 months of therapy and stays in your system for a year as the new nail grows out.  Oral  antifungal medications do require blood work to make sure it is a safe drug for you.  A liver function panel will be performed prior to starting the medication and after the first month of treatment.  It is important to have the blood work performed to avoid any harmful side effects.  In general, this medication safe but blood work is required. 4. Laser Therapy.  This treatment is performed by applying a specialized laser to the affected nail plate.  This therapy is noninvasive, fast, and non-painful.  It is not covered by insurance and is therefore, out of pocket.  The results have been very good with a 80-95% cure rate.  [facility] is the only practice in the area to offer this therapy. Permanent Nail Avulsion.  Removing the entire nail so that a new nail will not grow back.Corns and Calluses Corns are small areas of thickened skin that occur on the top, sides, or tip of a toe. They contain a cone-shaped core with a point that can press on a nerve below. This causes pain.  Calluses are areas of thickened skin that can occur anywhere on the body, including the hands, fingers, palms, soles of the feet, and heels. Calluses are usually larger than corns. What are the causes? Corns and calluses are caused by rubbing (friction) or pressure, such as from shoes that are too tight or do not fit properly. What increases the risk? Corns are more likely to develop in people who have misshapen   toes (toe deformities), such as hammer toes. Calluses can occur with friction to any area of the skin. They are more likely to develop in people who:  Work with their hands.  Wear shoes that fit poorly, are too tight, or are high-heeled.  Have toe deformities. What are the signs or symptoms? Symptoms of a corn or callus include:  A hard growth on the skin.  Pain or tenderness under the skin.  Redness and swelling.  Increased discomfort while wearing tight-fitting shoes, if your feet are affected. If a  corn or callus becomes infected, symptoms may include:  Redness and swelling that gets worse.  Pain.  Fluid, blood, or pus draining from the corn or callus. How is this diagnosed? Corns and calluses may be diagnosed based on your symptoms, your medical history, and a physical exam. How is this treated? Treatment for corns and calluses may include:  Removing the cause of the friction or pressure. This may involve: ? Changing your shoes. ? Wearing shoe inserts (orthotics) or other protective layers in your shoes, such as a corn pad. ? Wearing gloves.  Applying medicine to the skin (topical medicine) to help soften skin in the hardened, thickened areas.  Removing layers of dead skin with a file to reduce the size of the corn or callus.  Removing the corn or callus with a scalpel or laser.  Taking antibiotic medicines, if your corn or callus is infected.  Having surgery, if a toe deformity is the cause. Follow these instructions at home:   Take over-the-counter and prescription medicines only as told by your health care provider.  If you were prescribed an antibiotic, take it as told by your health care provider. Do not stop taking it even if your condition starts to improve.  Wear shoes that fit well. Avoid wearing high-heeled shoes and shoes that are too tight or too loose.  Wear any padding, protective layers, gloves, or orthotics as told by your health care provider.  Soak your hands or feet and then use a file or pumice stone to soften your corn or callus. Do this as told by your health care provider.  Check your corn or callus every day for symptoms of infection. Contact a health care provider if you:  Notice that your symptoms do not improve with treatment.  Have redness or swelling that gets worse.  Notice that your corn or callus becomes painful.  Have fluid, blood, or pus coming from your corn or callus.  Have new symptoms. Summary  Corns are small areas of  thickened skin that occur on the top, sides, or tip of a toe.  Calluses are areas of thickened skin that can occur anywhere on the body, including the hands, fingers, palms, and soles of the feet. Calluses are usually larger than corns.  Corns and calluses are caused by rubbing (friction) or pressure, such as from shoes that are too tight or do not fit properly.  Treatment may include wearing any padding, protective layers, gloves, or orthotics as told by your health care provider. This information is not intended to replace advice given to you by your health care provider. Make sure you discuss any questions you have with your health care provider. Document Released: 09/04/2004 Document Revised: 10/12/2017 Document Reviewed: 10/12/2017 Elsevier Interactive Patient Education  2019 [employer]  

## 2019-01-31 ENCOUNTER — Ambulatory Visit: Payer: Self-pay

## 2019-02-05 ENCOUNTER — Ambulatory Visit (INDEPENDENT_AMBULATORY_CARE_PROVIDER_SITE_OTHER): Payer: PPO | Admitting: Medical

## 2019-02-05 ENCOUNTER — Ambulatory Visit: Payer: Self-pay

## 2019-02-05 ENCOUNTER — Encounter: Payer: Self-pay | Admitting: Medical

## 2019-02-05 VITALS — BP 144/78 | HR 70 | Temp 97.7°F | Resp 16 | Ht 62.0 in | Wt 168.4 lb

## 2019-02-05 DIAGNOSIS — R0981 Nasal congestion: Secondary | ICD-10-CM | POA: Diagnosis not present

## 2019-02-05 DIAGNOSIS — J069 Acute upper respiratory infection, unspecified: Secondary | ICD-10-CM | POA: Diagnosis not present

## 2019-02-05 MED ORDER — AZELASTINE HCL 0.1 % NA SOLN: 2.0000 | Freq: Two times a day (BID) | NASAL | 3 refills | Status: DC

## 2019-02-05 MED ORDER — BENZONATATE 100 MG PO CAPS: 100.0000 mg | ORAL_CAPSULE | Freq: Three times a day (TID) | ORAL | 0 refills | Status: DC | PRN

## 2019-02-05 NOTE — Patient Instructions (Addendum)
You do have recent nasal congestion from uri vs possible allergic rhinitis.  Advise you get back on flonase and astelin daily.  Use benzonatate if needed for cough.  I want you to notify me if you develop sinus infection or bronchitis type symptoms. If that occurs will rx antiboitc.  For back pain can use low dose ibuprofen 200-400 mg  if needed.  Follow up date to be determined based with me if needed. Follow up with pcp as regularly scheduled.

## 2019-02-05 NOTE — Progress Notes (Signed)
Subjective:    Patient ID: Jackie Stevens, female    DOB: September 22, 1946, 73 y.o.   MRN: [id number]  HPI  Pt has 4 days of nasal congestion and runny nose. Pt had flu about 3 weeks ago. She felt well for about 7 days before got uri type symptoms. Clear nasal drainage mostly. In morning little colored. Mild faint  sinus pressure.  No fever, no chills or sweats.   Able to sleep. Cough is minimal  Pt has some rt side lower back pain today. Points to area adjacent to lower throacic spine on rt side.    Review of Systems  Constitutional: Negative for chills, fatigue and fever.  HENT: Positive for congestion and sinus pressure. Negative for ear pain and sinus pain.   Respiratory: Positive for cough. Negative for chest tightness, shortness of breath and wheezing.   Cardiovascular: Negative for chest pain and palpitations.  Gastrointestinal: Negative for abdominal pain.  Musculoskeletal: Negative for back pain and joint swelling.  Skin: Negative for rash.  Neurological: Negative for dizziness, speech difficulty, weakness and headaches.  Hematological: Negative for adenopathy. Does not bruise/bleed easily.  Psychiatric/Behavioral: Negative for behavioral problems, confusion, sleep disturbance and suicidal ideas.   Past Medical History:  Diagnosis Date  . Abnormal cervical cytology 10/25/2012   Follows with Dr Leavy Cella of Gyn  . Allergic rhinitis   . Anemia 10/06/2013  . Anginal pain ([place])    pt has history of CP states had cardiac workup with no specific issues identified   . Anxiety    when increased stress   . Arthritis    "knees; right thumb; shoulders" (2013/01/17)  . Asthma   . Chest pain   . Chicken pox as a child  . Complication of anesthesia   . Constipation   . Decreased hearing   . Dermatitis 03/06/2017  . Diverticulitis 10/25/2012   pt. reports that a drain was placed - 09/2012    . Dry mouth   . Excessive thirst   . External hemorrhoid, bleeding    "sometimes"  (2013-01-17)  . Frequent urination   . GERD (gastroesophageal reflux disease)   . H/O hiatal hernia   . Hand tingling   . Heart murmur   . HTN (hypertension)    stress test completed by Anselm Lis, diagnosed as GERD  . Hyperlipidemia   . Hypothyroidism   . Incontinence   . Infertility, female   . Insomnia   . Joint pain   . Kidney stones 1970's   "passed on their own" (2013/01/17)  . Knee pain   . Left shoulder pain 09/14/2016  . Low back pain 06/09/2014  . Measles as a child  . Medicare annual wellness visit, subsequent 10/06/2013   Steinhoffer of Dermatology Pneumovax in 2012   . Memory difficulties 09/05/2017  . Obesity 09/11/2017  . OSA on CPAP   . Palpitations   . Paresthesia 03/23/2015   Left face  . PONV (postoperative nausea and vomiting)   . Preventative health care 10/06/2013   [facility]inhoffer of Dermat[facility] Pneumovax in 2012  . Preventative health care 02/26/2016  . Rheumatoid arteritis ()   . Shortness of breath dyspnea    using stairs  . Sinus pain   . Sleep apnea   . Swelling of both lower extremities   . Thyroid cancer ([place]) 1980's  . Thyroid disease   . Tinnitus   . UTI (urinary tract infection) 04/02/2013     Social History   Socioeconomic History  .  Marital status: Married    Spouse name: [name]  . Number of children: 2  . Years of education: Not on file  . Highest education level: Not on file  Occupational History  . Occupation: Retired  Scientific laboratory technician  . Financial resource strain: Not on file  . Food insecurity:    Worry: Not on file    Inability: Not on file  . Transportation needs:    Medical: Not on file    Non-medical: Not on file  Tobacco Use  . Smoking status: Never Smoker  . Smokeless tobacco: Never Used  Substance and Sexual Activity  . Alcohol use: No    Alcohol/week: 0.0 standard drinks  . Drug use: No  . Sexual activity: Not Currently  Lifestyle  . Physical activity:    Days per week: Not on file    Minutes per session: Not  on file  . Stress: Not on file  Relationships  . Social connections:    Talks on phone: Not on file    Gets together: Not on file    Attends religious service: Not on file    Active member of club or organization: Not on file    Attends meetings of clubs or organizations: Not on file    Relationship status: Not on file  . Intimate partner violence:    Fear of current or ex partner: Not on file    Emotionally abused: Not on file    Physically abused: Not on file    Forced sexual activity: Not on file  Other Topics Concern  . Not on file  Social History Narrative   Married    children    Past Surgical History:  Procedure Laterality Date  . CHOLECYSTECTOMY  1990  . COLON SURGERY    . COLOSTOMY REVISION  12/19/2012   Procedure: COLON RESECTION SIGMOID;  Surgeon: Gwenyth Ober, MD;  Location: [facility];  Service: General;  Laterality: N/A;  . CYSTOSCOPY WITH STENT PLACEMENT  12/19/2012   Procedure: CYSTOSCOPY WITH STENT PLACEMENT;  Surgeon: Hanley Ben, MD;  Location: [facility];  Service: Urology;  Laterality: N/A;  . DILATION AND CURETTAGE OF UTERUS  1960's   "lots of them; had miscarriages" (12/19/2012)  . LYSIS OF ADHESION N/A 12/02/2015   Procedure: LAPAROSCOPIC LYSIS OF ADHESION;  Surgeon: Johnathan Hausen, MD;  Location: [facility];  Service: General;  Laterality: N/A;  . ROBOTIC ASSISTED BILATERAL SALPINGO OOPHERECTOMY Bilateral 12/02/2015   Procedure: XI ROBOTIC ASSISTED BILATERAL SALPINGO OOPHORECTOMY;  Surgeon: Everitt Amber, MD;  Location: [facility];  Service: Gynecology;  Laterality: Bilateral;  . SIGMOID RESECTION / RECTOPEXY  12/19/2012  . THYROIDECTOMY, PARTIAL  1988   "then did iodine to remove the rest" (12/19/2012)  . TONSILLECTOMY  1951?  . TRANSRECTAL DRAINAGE OF PELVIC ABSCESS  10/27/2012  . VAGINAL HYSTERECTOMY  1970's   "still have my ovaries" (12/19/2012)    Family History  Problem Relation Age of Onset  . Heart disease Father   . Pneumonia Father   . Hypertension Father   .  Hyperlipidemia Father   . Cancer Father        skin  . Stroke Father   . Alzheimer's disease Mother   . Heart disease Mother   . Depression Mother   . Emphysema Brother        marijuana and cigarettes  . Alcohol abuse Brother   . Hearing loss Brother   . Diabetes Maternal Grandmother   . Alzheimer's disease Paternal Grandmother   .  Cancer Paternal Grandmother        lung?- smoker  . Hyperlipidemia Paternal Grandmother   . Heart attack Paternal Grandfather   . Alcohol abuse Paternal Grandfather   . Neurofibromatosis Son        schwanomatosis  . Neurofibromatosis Son        swanomatosis  . Cancer Paternal Aunt     Allergies  Allergen Reactions  . Neomycin-Bacitracin Zn-Polymyx Rash    Polysporin- is tolerated   . Niacin Other (See Comments) and Cough    "cough til I threw up" (12/19/2012)  . Ciprofloxacin Hives    Got cipro and flagyl at same time, localized hives to IV arm  . Flagyl [Metronidazole] Hives    Got cipro and flagyl at same time, localized hives to IV arm    Current Outpatient Medications on File Prior to Visit  Medication Sig Dispense Refill  . acetic acid-hydrocortisone (VOSOL-HC) OTIC solution PLACE 4 DROPS INTO THE RIGHT EAR 3 (THREE) TIMES DAILY.    Marland Kitchen amLODipine (NORVASC) 2.5 MG tablet Take 2.5 mg by mouth daily.    Marland Kitchen azelastine (ASTELIN) 0.1 % nasal spray Place 2 sprays into both nostrils at bedtime as needed for rhinitis. Use in each nostril as directed 30 mL 3  . docusate sodium (COLACE) 100 MG capsule Take 100 mg by mouth 2 (two) times daily. Reported on 03/30/2016    . Fluocinolone Acetonide 0.01 % OIL PLACE 5 DROPS IN EAR(S) 2 TIMES DAILY FOR 14 DAYS.  2  . fluticasone (FLONASE) 50 MCG/ACT nasal spray Place 2 sprays into both nostrils daily. 48 g 3  . hydrochlorothiazide (HYDRODIURIL) 25 MG tablet TAKE 1 TABLET BY MOUTH EVERY DAY 90 tablet 1  . hyoscyamine (LEVSIN SL) 0.125 MG SL tablet Place 1 tablet (0.125 mg total) under the tongue every 4 (four)  hours as needed. 30 tablet 1  . KETOCONAZOLE, TOPICAL, 1 % SHAM Apply 1 Dose topically once a week. 200 mL 1  . levothyroxine (SYNTHROID, LEVOTHROID) 150 MCG tablet Take 150 mcg by mouth daily before breakfast.    . losartan (COZAAR) 100 MG tablet TAKE 1 TABLET BY MOUTH EVERY DAY 90 tablet 1  . Multiple Vitamins-Minerals (MULTIVITAMIN WOMEN 50+ PO) Take 1 tablet by mouth daily.    . naproxen (NAPROSYN) 375 MG tablet TAKE 1 TABLET (375 MG TOTAL) BY MOUTH 2 (TWO) TIMES DAILY WITH A MEAL. 180 tablet 1  . nitroGLYCERIN (NITROSTAT) 0.4 MG SL tablet PLACE 1 TABLET UNDER THE TONGUE EVERY 5 MINUTES AS NEEDED FOR CHEST PAIN 25 tablet 1  . omeprazole (PRILOSEC) 40 MG capsule TAKE 1 CAPSULE BY MOUTH TWICE A DAY 180 capsule 1  . Polyethyl Glycol-Propyl Glycol 0.4-0.3 % GEL Apply 1 drop to eye 2 (two) times daily as needed (dry eyes).     . ranitidine (ZANTAC) 300 MG tablet TAKE 1 TABLET BY MOUTH EVERYDAY AT BEDTIME 90 tablet 1  . simvastatin (ZOCOR) 40 MG tablet TAKE 1 TABLET BY MOUTH EVERYDAY AT BEDTIME 90 tablet 0  . sodium chloride (OCEAN) 0.65 % SOLN nasal spray Place 1 spray into both nostrils 2 (two) times daily as needed for congestion.     . TURMERIC PO Take 1 tablet by mouth daily. Reported on 12/19/2015    . Vitamin D, Cholecalciferol, 1000 UNITS CAPS Take 1 capsule by mouth daily.     No current facility-administered medications on file prior to visit.     BP (!) 144/78   Pulse 70  Temp 97.7 F (36.5 C) (Oral)   Resp 16   Ht 5\' 2"  (1.575 m)   Wt 168 lb 6.4 oz (76.4 kg)   SpO2 100%   BMI 30.80 kg/m       Objective:   Physical Exam  General  Mental Status - Alert. General Appearance - Well groomed. Not in acute distress.  Skin Rashes- No Rashes.  HEENT Head- Normal. Ear Auditory Canal - Left- Normal. Right - Normal.Tympanic Membrane- Left- Normal. Right- Normal. Eye Sclera/Conjunctiva- Left- Normal. Right- Normal. Nose & Sinuses Nasal Mucosa- Left-  Boggy and Congested.  Right-  Boggy and  Congested.Bilateral maxillary and frontal sinus pressure. Mouth & Throat Lips: Upper Lip- Normal: no dryness, cracking, pallor, cyanosis, or vesicular eruption. Lower Lip-Normal: no dryness, cracking, pallor, cyanosis or vesicular eruption. Buccal Mucosa- Bilateral- No Aphthous ulcers. Oropharynx- No Discharge or Erythema. Tonsils: Characteristics- Bilateral- No Erythema or Congestion. Size/Enlargement- Bilateral- No enlargement. Discharge- bilateral-None.  Neck Neck- Supple. No Masses.   Chest and Lung Exam Auscultation: Breath Sounds:-Clear even and unlabored.  Cardiovascular Auscultation:Rythm- Regular, rate and rhythm. Murmurs & Other Heart Sounds:Ausculatation of the heart reveal- No Murmurs.  Lymphatic Head & Neck General Head & Neck Lymphatics: Bilateral: Description- No Localized lymphadenopathy.  Back- faint rt sided lower para-lumbar tenderness to palpation.no rash or vesicles seen.       Assessment & Plan:  You do have recent nasal congestion from uri vs possible allergic rhinitis.  Advise you get back on flonase and astelin daily.  Use benzonatate if needed for cough.  I want you to notify me if you develop sinus infection or bronchitis type symptoms. If that occurs will rx antiboitc.  For back pain can use low dose ibuprofen 200-400 mg  if needed.  Follow up date to be determined based with me if needed. Follow up with pcp as regularly scheduled.  Mackie Pai, PA-C

## 2019-02-07 ENCOUNTER — Other Ambulatory Visit: Payer: Self-pay

## 2019-02-07 NOTE — Patient Outreach (Signed)
[facility]) Care Management  02/07/2019  NAIAH DONAHOE 10/20/1946 [id number]    2nd attempt to outreach the patient for initial assessment.  No answer.  HIPAA compliant voicemail left with contact information.  Plan: RN Health Coach will make outreach attempt to the patient within thirty days.  Lazaro Arms RN, BSN, [place] Direct Dial:  [phone]  Fax: [phone]

## 2019-02-19 ENCOUNTER — Other Ambulatory Visit: Payer: Self-pay | Admitting: Family Medicine

## 2019-03-05 ENCOUNTER — Encounter: Payer: PPO | Admitting: Family Medicine

## 2019-03-06 ENCOUNTER — Other Ambulatory Visit: Payer: Self-pay

## 2019-03-06 NOTE — Patient Outreach (Signed)
[facility]) Care Management  03/06/2019  NEHA WAIGHT 24-Apr-1946 [id number]   4th attempt to contact the patient.  No answer.  HIPAA compliant voicemail left with contact information.  Plan:  [facility] will make outreach attempt to the patient within sixty business days.  Lazaro Arms RN, BSN, [place] Direct Dial:  [phone]  Fax: [phone]

## 2019-03-21 ENCOUNTER — Telehealth: Payer: Self-pay | Admitting: Internal Medicine

## 2019-03-21 DIAGNOSIS — G4733 Obstructive sleep apnea (adult) (pediatric): Secondary | ICD-10-CM

## 2019-03-21 NOTE — Telephone Encounter (Signed)
Call made to [facility], inquired as to when patient is eligible for a new cpap machine. Per [facility] patient is eligible for a new cpap at this time. When therapy was initiated she brought her own machine and has only been getting supplies with them since 2014.  Call made to patient, she states she cancelled her referral to Dr. Ron Parker and would like to move forward with the new Cpap machine. She states they sent her a thick packet and she did not want to fill them out. Patient made aware because it is already 455 she most likely will get a call back tomorrow.   CY please advise if okay to place order for new cpap. If so would you like to continue settings at 10cm.

## 2019-03-22 NOTE — Telephone Encounter (Signed)
Order placed to [facility] for replacement CPAP machine as specified by [facility] below. Called spoke with patient and informed her that [facility] authorized replacement CPAP and that the order has been placed. Patient voiced her understanding and denied any questions/concerns at this time. Nothing further needed; will sign off.

## 2019-03-22 NOTE — Telephone Encounter (Signed)
Order dME [facility]- please replace old CPAP machine, change to auto 5-15, mask of choice, humidifier, hoses, filters, supplies, AirView/ card    Dx OSA

## 2019-04-05 ENCOUNTER — Telehealth: Payer: Self-pay | Admitting: Internal Medicine

## 2019-04-05 NOTE — Telephone Encounter (Signed)
Left message with [facility].

## 2019-04-05 NOTE — Telephone Encounter (Signed)
ATC back.  Note says [facility] is DME and order placed at [facility] for cpap but unable to reach them today by phone.  No answer at [facility].

## 2019-04-05 NOTE — Telephone Encounter (Signed)
Patient returned call regarding cpap.  States she uses [facility] not [facility] or [facility].  Patient states if she needs to change companies she would consider it.  Advised patient we would contact [facility] in the morning to see what the status of the machine is and then update her and we can discuss how she wishes to proceed.  Apologized for any confusion.  Patient acknowledged understanding.  Will follow up 04/06/19.

## 2019-04-06 NOTE — Telephone Encounter (Signed)
Called [facility] to check the status of pt's CPAP machine. Representative I spoke to stated CPAP is still processing and that she will call the pt today to schedule an appt for pt to receive machine/supplies.   Called pt to inform her of this. Pt verbalized understanding w/ no additional questions. I suggested to pt if she doesn't hear from [facility] by the end of the day to give our office a call back.   Nothing further needed at this time.

## 2019-04-09 ENCOUNTER — Encounter: Payer: Self-pay | Admitting: Podiatry

## 2019-04-09 ENCOUNTER — Ambulatory Visit: Payer: PPO | Admitting: Podiatry

## 2019-04-09 ENCOUNTER — Other Ambulatory Visit: Payer: Self-pay

## 2019-04-09 VITALS — Temp 97.7°F

## 2019-04-09 DIAGNOSIS — B351 Tinea unguium: Secondary | ICD-10-CM

## 2019-04-09 DIAGNOSIS — M79676 Pain in unspecified toe(s): Secondary | ICD-10-CM | POA: Diagnosis not present

## 2019-04-09 DIAGNOSIS — Q828 Other specified congenital malformations of skin: Secondary | ICD-10-CM | POA: Diagnosis not present

## 2019-04-09 NOTE — Patient Instructions (Signed)

## 2019-04-12 DIAGNOSIS — G4733 Obstructive sleep apnea (adult) (pediatric): Secondary | ICD-10-CM | POA: Diagnosis not present

## 2019-04-13 ENCOUNTER — Encounter: Payer: Self-pay | Admitting: Podiatry

## 2019-04-13 NOTE — Progress Notes (Signed)
Subjective: Jackie Stevens presents today for preventative foot care. She has painful mycotic toenails which she cannot cut herself and painful plantar callosities. Both interfere with daily activities.  Pain is aggravated when wearing enclosed shoe gear.  Mosie Lukes, MD is her PCP.    Current Outpatient Medications:  .  acetic acid-hydrocortisone (VOSOL-HC) OTIC solution, PLACE 4 DROPS INTO THE RIGHT EAR 3 (THREE) TIMES DAILY., Disp: , Rfl:  .  amLODipine (NORVASC) 2.5 MG tablet, Take 2.5 mg by mouth daily., Disp: , Rfl:  .  azelastine (ASTELIN) 0.1 % nasal spray, Place 2 sprays into both nostrils at bedtime as needed for rhinitis. Use in each nostril as directed, Disp: 30 mL, Rfl: 3 .  azelastine (ASTELIN) 0.1 % nasal spray, Place 2 sprays into both nostrils 2 (two) times daily. Use in each nostril as directed, Disp: 30 mL, Rfl: 3 .  benzonatate (TESSALON) 100 MG capsule, Take 1 capsule (100 mg total) by mouth 3 (three) times daily as needed for cough., Disp: 30 capsule, Rfl: 0 .  docusate sodium (COLACE) 100 MG capsule, Take 100 mg by mouth 2 (two) times daily. Reported on 03/30/2016, Disp: , Rfl:  .  Fluocinolone Acetonide 0.01 % OIL, PLACE 5 DROPS IN EAR(S) 2 TIMES DAILY FOR 14 DAYS., Disp: , Rfl: 2 .  fluticasone (FLONASE) 50 MCG/ACT nasal spray, Place 2 sprays into both nostrils daily., Disp: 48 g, Rfl: 3 .  hydrochlorothiazide (HYDRODIURIL) 25 MG tablet, TAKE 1 TABLET BY MOUTH EVERY DAY, Disp: 90 tablet, Rfl: 1 .  hyoscyamine (LEVSIN SL) 0.125 MG SL tablet, Place 1 tablet (0.125 mg total) under the tongue every 4 (four) hours as needed., Disp: 30 tablet, Rfl: 1 .  KETOCONAZOLE, TOPICAL, 1 % SHAM, Apply 1 Dose topically once a week., Disp: 200 mL, Rfl: 1 .  levothyroxine (SYNTHROID, LEVOTHROID) 150 MCG tablet, Take 150 mcg by mouth daily before breakfast., Disp: , Rfl:  .  losartan (COZAAR) 100 MG tablet, TAKE 1 TABLET BY MOUTH EVERY DAY, Disp: 90 tablet, Rfl: 1 .  Multiple  Vitamins-Minerals (MULTIVITAMIN WOMEN 50+ PO), Take 1 tablet by mouth daily., Disp: , Rfl:  .  naproxen (NAPROSYN) 375 MG tablet, TAKE 1 TABLET (375 MG TOTAL) BY MOUTH 2 (TWO) TIMES DAILY WITH A MEAL., Disp: 180 tablet, Rfl: 1 .  nitroGLYCERIN (NITROSTAT) 0.4 MG SL tablet, PLACE 1 TABLET UNDER THE TONGUE EVERY 5 MINUTES AS NEEDED FOR CHEST PAIN, Disp: 25 tablet, Rfl: 1 .  omeprazole (PRILOSEC) 40 MG capsule, TAKE 1 CAPSULE BY MOUTH TWICE A DAY, Disp: 180 capsule, Rfl: 1 .  Polyethyl Glycol-Propyl Glycol 0.4-0.3 % GEL, Apply 1 drop to eye 2 (two) times daily as needed (dry eyes). , Disp: , Rfl:  .  ranitidine (ZANTAC) 300 MG tablet, TAKE 1 TABLET BY MOUTH EVERYDAY AT BEDTIME, Disp: 90 tablet, Rfl: 1 .  simvastatin (ZOCOR) 40 MG tablet, TAKE 1 TABLET BY MOUTH EVERYDAY AT BEDTIME, Disp: 90 tablet, Rfl: 0 .  sodium chloride (OCEAN) 0.65 % SOLN nasal spray, Place 1 spray into both nostrils 2 (two) times daily as needed for congestion. , Disp: , Rfl:  .  TURMERIC PO, Take 1 tablet by mouth daily. Reported on 12/19/2015, Disp: , Rfl:  .  Vitamin D, Cholecalciferol, 1000 UNITS CAPS, Take 1 capsule by mouth daily., Disp: , Rfl:   Allergies  Allergen Reactions  . Neomycin-Bacitracin Zn-Polymyx Rash    Polysporin- is tolerated   . Niacin Other (See Comments) and Cough    "  cough til I threw up" (12/19/2012)  . Ciprofloxacin Hives    Got cipro and flagyl at same time, localized hives to IV arm  . Flagyl [Metronidazole] Hives    Got cipro and flagyl at same time, localized hives to IV arm    Objective:  Vascular Examination: Capillary refill time <3 seconds x 10 digits.  Dorsalis pedis and Posterior tibial pulses palpable b/l.  Digital hair present x 10 digits.  Skin temperature gradient WNL b/l.  Dermatological Examination: Skin with normal turgor, texture and tone b/l.  Toenails 1-5 b/l discolored, thick, dystrophic with subungual debris and pain with palpation to nailbeds due to thickness of  nails.  Porokeratotic lesions submet head 2 right foot, lateral 4th PIPJ, medial DIPJ right 2nd digit with tenderness to palpation. No erythema, no edema, no drainage, no flocculence.  Musculoskeletal: Muscle strength 5/5 to all LE muscle groups.  HAV with bunion b/l.  Overlapping hammertoe deformity 2nd digit b/l.  No pain, crepitus or joint limitation noted with ROM.   Neurological: Sensation intact with 10 gram monofilament.  Vibratory sensation intact.  Assessment: 1.  Painful onychomycosis toenails 1-5 b/l  2.  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  HAV with bunion b/l 4.  Hammertoe b/l 2nd digit  Plan: 1. Toenails 1-5 b/l were debrided in length and girth without iatrogenic bleeding. Porokeratosis submet head 2 right foot, lateral 4th PIPJ, medial DIPJ right 2nd digit pared and enucleated with sterile scalpel blade without incident. 2. Patient to continue soft, supportive shoe gear daily. 3. Patient to report any pedal injuries to medical professional immediately. 4. Follow up 10 weeks. 5. Patient/POA to call should there be a concern in the interim.

## 2019-04-17 ENCOUNTER — Encounter: Payer: PPO | Admitting: Family Medicine

## 2019-04-20 DIAGNOSIS — Z1331 Encounter for screening for depression: Secondary | ICD-10-CM | POA: Diagnosis not present

## 2019-04-20 DIAGNOSIS — I5189 Other ill-defined heart diseases: Secondary | ICD-10-CM | POA: Diagnosis not present

## 2019-04-20 DIAGNOSIS — Z1339 Encounter for screening examination for other mental health and behavioral disorders: Secondary | ICD-10-CM | POA: Diagnosis not present

## 2019-04-20 DIAGNOSIS — Z0000 Encounter for general adult medical examination without abnormal findings: Secondary | ICD-10-CM | POA: Diagnosis not present

## 2019-04-20 DIAGNOSIS — E890 Postprocedural hypothyroidism: Secondary | ICD-10-CM | POA: Diagnosis not present

## 2019-04-20 DIAGNOSIS — D649 Anemia, unspecified: Secondary | ICD-10-CM | POA: Diagnosis not present

## 2019-04-20 DIAGNOSIS — E871 Hypo-osmolality and hyponatremia: Secondary | ICD-10-CM | POA: Diagnosis not present

## 2019-04-20 DIAGNOSIS — G4730 Sleep apnea, unspecified: Secondary | ICD-10-CM | POA: Diagnosis not present

## 2019-04-20 DIAGNOSIS — E669 Obesity, unspecified: Secondary | ICD-10-CM | POA: Diagnosis not present

## 2019-04-20 DIAGNOSIS — C73 Malignant neoplasm of thyroid gland: Secondary | ICD-10-CM | POA: Diagnosis not present

## 2019-04-20 DIAGNOSIS — J45909 Unspecified asthma, uncomplicated: Secondary | ICD-10-CM | POA: Diagnosis not present

## 2019-04-20 DIAGNOSIS — E785 Hyperlipidemia, unspecified: Secondary | ICD-10-CM | POA: Diagnosis not present

## 2019-04-20 DIAGNOSIS — I10 Essential (primary) hypertension: Secondary | ICD-10-CM | POA: Diagnosis not present

## 2019-04-20 DIAGNOSIS — I779 Disorder of arteries and arterioles, unspecified: Secondary | ICD-10-CM | POA: Diagnosis not present

## 2019-04-23 ENCOUNTER — Ambulatory Visit: Payer: PPO | Admitting: Internal Medicine

## 2019-04-23 DIAGNOSIS — E890 Postprocedural hypothyroidism: Secondary | ICD-10-CM | POA: Diagnosis not present

## 2019-04-23 DIAGNOSIS — Z20818 Contact with and (suspected) exposure to other bacterial communicable diseases: Secondary | ICD-10-CM | POA: Diagnosis not present

## 2019-04-25 ENCOUNTER — Telehealth: Payer: Self-pay | Admitting: Family Medicine

## 2019-04-25 ENCOUNTER — Other Ambulatory Visit: Payer: Self-pay | Admitting: Family Medicine

## 2019-04-25 MED ORDER — FAMOTIDINE 40 MG PO TABS: 40.0000 mg | ORAL_TABLET | Freq: Every day | ORAL | 1 refills | Status: DC

## 2019-04-25 NOTE — Telephone Encounter (Signed)
Copied from [place] [id number]. Topic: Quick Communication - Rx Refill/Question >> Apr 25, 2019  2:29 PM Nils Flack wrote: Medication:zantac  Has the patient contacted their pharmacy? Yes.   (Agent: If no, request that the patient contact the pharmacy for the refill.) (Agent: If yes, when and what did the pharmacy advise?)  Preferred Pharmacy (with phone number or street name): [facility] [phone] Zantac was taken off market, pharm would like to change to pepcid  They faxed this 2 times prior    Agent: Please be advised that RX refills may take up to 3 business days. We ask that you follow-up with your pharmacy.

## 2019-04-25 NOTE — Telephone Encounter (Signed)
Please advise- Zantac recalled.

## 2019-04-25 NOTE — Telephone Encounter (Signed)
I sent the famotidine to her pharmacy, please let her know this is the first time I have seen this request

## 2019-04-29 ENCOUNTER — Emergency Department (HOSPITAL_COMMUNITY): Payer: PPO

## 2019-04-29 ENCOUNTER — Encounter (HOSPITAL_COMMUNITY): Payer: Self-pay

## 2019-04-29 ENCOUNTER — Emergency Department (HOSPITAL_COMMUNITY)
Admission: EM | Admit: 2019-04-29 | Discharge: 2019-04-30 | Disposition: A | Discharge status: Home / Self Care | Payer: PPO | Attending: Emergency Medicine | Admitting: Emergency Medicine

## 2019-04-29 ENCOUNTER — Other Ambulatory Visit: Payer: Self-pay

## 2019-04-29 DIAGNOSIS — E039 Hypothyroidism, unspecified: Secondary | ICD-10-CM | POA: Diagnosis not present

## 2019-04-29 DIAGNOSIS — Z79899 Other long term (current) drug therapy: Secondary | ICD-10-CM | POA: Insufficient documentation

## 2019-04-29 DIAGNOSIS — J45909 Unspecified asthma, uncomplicated: Secondary | ICD-10-CM | POA: Diagnosis not present

## 2019-04-29 DIAGNOSIS — R002 Palpitations: Secondary | ICD-10-CM | POA: Insufficient documentation

## 2019-04-29 DIAGNOSIS — I10 Essential (primary) hypertension: Secondary | ICD-10-CM | POA: Diagnosis not present

## 2019-04-29 LAB — I-STAT CHEM 8, ED
BUN: 20 mg/dL (ref 8–23)
Calcium, Ion: 1.12 mmol/L — ABNORMAL LOW (ref 1.15–1.40)
Chloride: 99 mmol/L (ref 98–111)
Creatinine, Ser: 0.7 mg/dL (ref 0.44–1.00)
Glucose, Bld: 117 mg/dL — ABNORMAL HIGH (ref 70–99)
HCT: 40 % (ref 36.0–46.0)
Hemoglobin: 13.6 g/dL (ref 12.0–15.0)
Potassium: 3.4 mmol/L — ABNORMAL LOW (ref 3.5–5.1)
Sodium: 136 mmol/L (ref 135–145)
TCO2: 28 mmol/L (ref 22–32)

## 2019-04-29 LAB — CBC
HCT: 38.8 % (ref 36.0–46.0)
Hemoglobin: 12.9 g/dL (ref 12.0–15.0)
MCH: 29.9 pg (ref 26.0–34.0)
MCHC: 33.2 g/dL (ref 30.0–36.0)
MCV: 89.8 fL (ref 80.0–100.0)
Platelets: 230 10*3/uL (ref 150–400)
RBC: 4.32 MIL/uL (ref 3.87–5.11)
RDW: 12.7 % (ref 11.5–15.5)
WBC: 8.2 10*3/uL (ref 4.0–10.5)
nRBC: 0 % (ref 0.0–0.2)

## 2019-04-29 LAB — BASIC METABOLIC PANEL
Anion gap: 10 (ref 5–15)
BUN: 17 mg/dL (ref 8–23)
CO2: 26 mmol/L (ref 22–32)
Calcium: 8.9 mg/dL (ref 8.9–10.3)
Chloride: 99 mmol/L (ref 98–111)
Creatinine, Ser: 0.73 mg/dL (ref 0.44–1.00)
GFR calc Af Amer: >60 mL/min (ref >60)
GFR calc non Af Amer: >60 mL/min (ref >60)
Glucose, Bld: 117 mg/dL — ABNORMAL HIGH (ref 70–99)
Potassium: 3.3 mmol/L — ABNORMAL LOW (ref 3.5–5.1)
Sodium: 135 mmol/L (ref 135–145)

## 2019-04-29 LAB — TROPONIN I: Troponin I: <0.03 ng/mL (ref <0.03)

## 2019-04-29 MED ORDER — SODIUM CHLORIDE 0.9% FLUSH: 3.0000 mL | Freq: Once | INTRAVENOUS | Status: DC

## 2019-04-29 NOTE — ED Notes (Signed)
Pt spoke w/ husband and updated him w/ her status.

## 2019-04-29 NOTE — ED Triage Notes (Signed)
Onset  7p  Pt had heart palpitations that lasted 30 minutes.  Pt was checking BP and it was high.  BP at home 171/70 HR 90's.  No heart palpitations or any other symptoms at this time.

## 2019-04-29 NOTE — ED Notes (Signed)
Patient transported to X-ray 

## 2019-04-29 NOTE — ED Notes (Addendum)
[facility] [phone], home [phone]. Will be waiting in car for update.

## 2019-04-30 LAB — TROPONIN I: Troponin I: <0.03 ng/mL (ref <0.03)

## 2019-04-30 NOTE — ED Provider Notes (Signed)
I assumed care of this patient from Dr. Kathrynn Humble at 0000.  Please see their note for further details of Hx, PE.  Briefly patient is a 73 y.o. female who presented with palpitations. EKG reassuring. Noted to have PVCs on tele, likely the cause of her palpitations. Trop negative. Other labs reassuring. Chest x-ray without evidence suggestive of pneumonia, pneumothorax, pneumomediastinum.  No abnormal contour of the mediastinum to suggest dissection. No evidence of acute injuries.  Current plan is to follow up delta trop.  Delta trop negative.  The patient is safe for discharge with strict return precautions.  Disposition: Discharge  Condition: Good  I have discussed the results, Dx and Tx plan with the patient who expressed understanding and agree(s) with the plan. Discharge instructions discussed at great length. The patient was given strict return precautions who verbalized understanding of the instructions. No further questions at time of discharge.    ED Discharge Orders    None       Follow Up: Mosie Lukes, [place] [address] [phone]  Schedule an appointment as soon as possible for a visit  As needed         Chrstopher Malenfant, Grayce Sessions, MD 04/30/19 [phone]

## 2019-04-30 NOTE — ED Provider Notes (Signed)
[facility] EMERGENCY DEPARTMENT Provider Note   CSN: [id number] Arrival date & time: 04/29/19  2104    History   Chief Complaint Chief Complaint  Patient presents with  . Palpitations    HPI Jackie Stevens is a 73 y.o. female.  HPI: A 73 year old patient with a history of hypertension and hypercholesterolemia presents for evaluation of chest pain. Initial onset of pain was approximately 1-3 hours ago. The patient's chest pain is not worse with exertion. The patient's chest pain is middle- or left-sided, is not well-localized, is not described as heaviness/pressure/tightness, is not sharp and does not radiate to the arms/jaw/neck. The patient does not complain of nausea and denies diaphoresis. The patient has no history of stroke, has no history of peripheral artery disease, has not smoked in the past 90 days, denies any history of treated diabetes, has no relevant family history of coronary artery disease (first degree relative at less than age 17) and does not have an elevated BMI (>=30).   HPI  Past Medical History:  Diagnosis Date  . Abnormal cervical cytology 10/25/2012   Follows with Dr Leavy Cella of Gyn  . Allergic rhinitis   . Anemia 10/06/2013  . Anginal pain ([place])    pt has history of CP states had cardiac workup with no specific issues identified   . Anxiety    when increased stress   . Arthritis    "knees; right thumb; shoulders" (2013-01-09)  . Asthma   . Chest pain   . Chicken pox as a child  . Complication of anesthesia   . Constipation   . Decreased hearing   . Dermatitis 03/06/2017  . Diverticulitis 10/25/2012   pt. reports that a drain was placed - 09/2012    . Dry mouth   . Excessive thirst   . External hemorrhoid, bleeding    "sometimes" (January 09, 2013)  . Frequent urination   . GERD (gastroesophageal reflux disease)   . H/O hiatal hernia   . Hand tingling   . Heart murmur   . HTN (hypertension)    stress test completed by Anselm Lis, diagnosed as GERD  . Hyperlipidemia   . Hypothyroidism   . Incontinence   . Infertility, female   . Insomnia   . Joint pain   . Kidney stones 1970's   "passed on their own" (January 09, 2013)  . Knee pain   . Left shoulder pain 09/14/2016  . Low back pain 06/09/2014  . Measles as a child  . Medicare annual wellness visit, subsequent 10/06/2013   Steinhoffer of Dermatology Pneumovax in 2012   . Memory difficulties 09/05/2017  . Obesity 09/11/2017  . OSA on CPAP   . Palpitations   . Paresthesia 03/23/2015   Left face  . PONV (postoperative nausea and vomiting)   . Preventative health care 10/06/2013   Steinhoffer of Dermatology Pneumovax in 2012  . Preventative health care 02/26/2016  . Rheumatoid arteritis ([place])   . Shortness of breath dyspnea    using stairs  . Sinus pain   . Sleep apnea   . Swelling of both lower extremities   . Thyroid cancer ([place]) 1980's  . Thyroid disease   . Tinnitus   . UTI (urinary tract infection) 04/02/2013    Patient Active Problem List   Diagnosis Date Noted  . Other fatigue 02/28/2018  . Shortness of breath on exertion 02/28/2018  . Vitamin D deficiency 02/28/2018  . Obesity 09/11/2017  . Memory difficulties 09/05/2017  .  Dermatitis 03/06/2017  . Arthritis of left shoulder region 09/30/2016  . Pain of joint of left ankle and foot 09/15/2016  . Left shoulder pain 09/14/2016  . Preventative health care 02/26/2016  . Radicular pain of sacrum 02/26/2016  . Vitamin B12 deficiency 02/26/2016  . Ventral hernia 12/02/2015  . Pelvic mass in female 12/02/2015  . Colitis 09/14/2015  . Paresthesia 03/23/2015  . Otitis, externa, infective 03/13/2015  . Hyponatremia 03/13/2015  . Cystitis 09/10/2014  . Headache(784.0) 08/21/2014  . Low back pain 06/09/2014  . Anemia 10/06/2013  . Medicare annual wellness visit, subsequent 10/06/2013  . UTI (urinary tract infection) 04/02/2013  . Asthma, mild intermittent 04/02/2013  . Internal hemorrhoid,  bleeding 01/23/2013  . Diverticulitis of rectosigmoid 11/28/2012  . Colonic diverticular abscess 11/21/2012  . Abnormal cervical cytology 10/25/2012  . Cancer ([place])   . Insomnia   . Hypothyroidism   . Constipation   . Cough 09/02/2012  . Chest pain 08/05/2011  . Hyperlipidemia, mixed 11/08/2010  . Essential hypertension 11/08/2010  . G E R D 11/08/2010  . Obstructive sleep apnea 11/04/2010  . Allergic rhinitis 11/04/2010    Past Surgical History:  Procedure Laterality Date  . CHOLECYSTECTOMY  1990  . COLON SURGERY    . COLOSTOMY REVISION  12/19/2012   Procedure: COLON RESECTION SIGMOID;  Surgeon: Gwenyth Ober, MD;  Location: [place];  Service: General;  Laterality: N/A;  . CYSTOSCOPY WITH STENT PLACEMENT  12/19/2012   Procedure: CYSTOSCOPY WITH STENT PLACEMENT;  Surgeon: Hanley Ben, MD;  Location: [facility];  Service: Urology;  Laterality: N/A;  . DILATION AND CURETTAGE OF UTERUS  1960's   "lots of them; had miscarriages" (12/19/2012)  . LYSIS OF ADHESION N/A 12/02/2015   Procedure: LAPAROSCOPIC LYSIS OF ADHESION;  Surgeon: Johnathan Hausen, MD;  Location: [facility];  Service: General;  Laterality: N/A;  . ROBOTIC ASSISTED BILATERAL SALPINGO OOPHERECTOMY Bilateral 12/02/2015   Procedure: XI ROBOTIC ASSISTED BILATERAL SALPINGO OOPHORECTOMY;  Surgeon: Everitt Amber, MD;  Location: [facility];  Service: Gynecology;  Laterality: Bilateral;  . SIGMOID RESECTION / RECTOPEXY  12/19/2012  . THYROIDECTOMY, PARTIAL  1988   "then did iodine to remove the rest" (12/19/2012)  . TONSILLECTOMY  1951?  . TRANSRECTAL DRAINAGE OF PELVIC ABSCESS  10/27/2012  . VAGINAL HYSTERECTOMY  1970's   "still have my ovaries" (12/19/2012)     OB History    Gravida  4   Para  2   Term  2   Preterm      AB  2   Living        SAB      TAB      Ectopic      Multiple      Live Births               Home Medications    Prior to Admission medications   Medication Sig Start Date End Date Taking? Authorizing  Provider  amLODipine (NORVASC) 2.5 MG tablet Take 2.5 mg by mouth daily.   Yes [provider]  azelastine (ASTELIN) 0.1 % nasal spray Place 2 sprays into both nostrils 2 (two) times daily. Use in each nostril as directed 02/05/19  Yes Saguier, Percell Miller, PA-C  docusate sodium (COLACE) 100 MG capsule Take 100 mg by mouth 2 (two) times daily. Reported on 03/30/2016   Yes [provider]  famotidine (PEPCID) 40 MG tablet Take 1 tablet (40 mg total) by mouth daily. 04/25/19  Yes Mosie Lukes,  MD  hydrochlorothiazide (HYDRODIURIL) 25 MG tablet TAKE 1 TABLET BY MOUTH EVERY DAY Patient taking differently: Take 25 mg by mouth daily.  01/04/19  Yes Mosie Lukes, MD  KETOCONAZOLE, TOPICAL, 1 % SHAM Apply 1 Dose topically once a week. 03/03/18  Yes Mosie Lukes, MD  levothyroxine (SYNTHROID, LEVOTHROID) 150 MCG tablet Take 150 mcg by mouth daily before breakfast.   Yes [provider]  losartan (COZAAR) 100 MG tablet TAKE 1 TABLET BY MOUTH EVERY DAY Patient taking differently: Take 100 mg by mouth daily.  01/04/19  Yes Mosie Lukes, MD  Multiple Vitamins-Minerals (MULTIVITAMIN WOMEN 50+ PO) Take 1 tablet by mouth daily.   Yes [provider]  naproxen (NAPROSYN) 375 MG tablet TAKE 1 TABLET (375 MG TOTAL) BY MOUTH 2 (TWO) TIMES DAILY WITH A MEAL. 01/16/19  Yes Mosie Lukes, MD  nitroGLYCERIN (NITROSTAT) 0.4 MG SL tablet PLACE 1 TABLET UNDER THE TONGUE EVERY 5 MINUTES AS NEEDED FOR CHEST PAIN Patient taking differently: Place 0.4 mg under the tongue every 5 (five) minutes as needed for chest pain.  11/07/18  Yes Mosie Lukes, MD  omeprazole (PRILOSEC) 40 MG capsule TAKE 1 CAPSULE BY MOUTH TWICE A DAY Patient taking differently: Take 40 mg by mouth 2 (two) times a day.  12/04/18  Yes Mosie Lukes, MD  simvastatin (ZOCOR) 40 MG tablet TAKE 1 TABLET BY MOUTH EVERYDAY AT BEDTIME Patient taking differently: Take 40 mg by mouth at bedtime.  02/20/19  Yes Mosie Lukes,  MD  TURMERIC PO Take 1 tablet by mouth daily. Reported on 12/19/2015   Yes [provider]  Vitamin D, Cholecalciferol, 1000 UNITS CAPS Take 1 capsule by mouth daily.   Yes [provider]  azelastine (ASTELIN) 0.1 % nasal spray Place 2 sprays into both nostrils at bedtime as needed for rhinitis. Use in each nostril as directed Patient not taking: Reported on 04/30/2019 01/08/19   Colon Branch, MD  benzonatate (TESSALON) 100 MG capsule Take 1 capsule (100 mg total) by mouth 3 (three) times daily as needed for cough. Patient not taking: Reported on 04/30/2019 02/05/19   Saguier, Percell Miller, PA-C  fluticasone [facility]) 50 MCG/ACT nasal spray Place 2 sprays into both nostrils daily. Patient not taking: Reported on 04/30/2019 03/29/17   Mosie Lukes, MD  hyoscyamine (LEVSIN SL) 0.125 MG SL tablet Place 1 tablet (0.125 mg total) under the tongue every 4 (four) hours as needed. Patient not taking: Reported on 04/30/2019 11/07/18   Mosie Lukes, MD    Family History Family History  Problem Relation Age of Onset  . Heart disease Father   . Pneumonia Father   . Hypertension Father   . Hyperlipidemia Father   . Cancer Father        skin  . Stroke Father   . Alzheimer's disease Mother   . Heart disease Mother   . Depression Mother   . Emphysema Brother        marijuana and cigarettes  . Alcohol abuse Brother   . Hearing loss Brother   . Diabetes Maternal Grandmother   . Alzheimer's disease Paternal Grandmother   . Cancer Paternal Grandmother        lung?- smoker  . Hyperlipidemia Paternal Grandmother   . Heart attack Paternal Grandfather   . Alcohol abuse Paternal Grandfather   . Neurofibromatosis Son        schwanomatosis  . Neurofibromatosis Son  swanomatosis  . Cancer Paternal Aunt     Social History Social History   Tobacco Use  . Smoking status: Never Smoker  . Smokeless tobacco: Never Used  Substance Use Topics  . Alcohol use: No    Alcohol/week: 0.0  standard drinks  . Drug use: No     Allergies   Neomycin-bacitracin zn-polymyx; Niacin; Ciprofloxacin; and Flagyl [metronidazole]   Review of Systems Review of Systems  Constitutional: Positive for activity change.  Cardiovascular: Positive for palpitations.  All other systems reviewed and are negative.    Physical Exam Updated Vital Signs BP 137/62 (BP Location: Right Arm)   Pulse 77   Temp 98.3 F (36.8 C) (Oral)   Resp 16   Ht 5\' 2"  (1.575 m)   Wt 81.6 kg   SpO2 98%   BMI 32.92 kg/m   Physical Exam Vitals signs and nursing note reviewed.  Constitutional:      Appearance: She is well-developed.  HENT:     Head: Normocephalic and atraumatic.  Neck:     Musculoskeletal: Normal range of motion and neck supple.  Cardiovascular:     Rate and Rhythm: Normal rate.  Pulmonary:     Effort: Pulmonary effort is normal.  Abdominal:     General: Bowel sounds are normal.  Skin:    General: Skin is warm and dry.  Neurological:     Mental Status: She is alert and oriented to person, place, and time.      ED Treatments / Results  Labs (all labs ordered are listed, but only abnormal results are displayed) Labs Reviewed  BASIC METABOLIC PANEL - Abnormal; Notable for the following components:      Result Value   Potassium 3.3 (*)    Glucose, Bld 117 (*)    All other components within normal limits  I-STAT CHEM 8, ED - Abnormal; Notable for the following components:   Potassium 3.4 (*)    Glucose, Bld 117 (*)    Calcium, Ion 1.12 (*)    All other components within normal limits  CBC  TROPONIN I  TROPONIN I    EKG EKG Interpretation  Date/Time:  Sunday Apr 29 2019 21:08:34 EDT Ventricular Rate:  79 PR Interval:  200 QRS Duration: 116 QT Interval:  386 QTC Calculation: 442 R Axis:   -56 Text Interpretation:  Normal sinus rhythm Possible Left atrial enlargement Left axis deviation Septal infarct , age undetermined Abnormal ECG No acute changes Nonspecific ST  and T wave abnormality No significant change since last tracing Confirmed by Varney Biles [phone]) on 04/29/2019 10:14:46 PM Also confirmed by Varney Biles [phone]), editor Lynder Parents [phone])  on 04/30/2019 8:09:31 AM   Radiology No results found.  Procedures Procedures (including critical care time)  Medications Ordered in ED Medications - No data to display   Initial Impression / Assessment and Plan / ED Course  I have reviewed the triage vital signs and the nursing notes.  Pertinent labs & imaging results that were available during my care of the patient were reviewed by me and considered in my medical decision making (see chart for details).     HEAR Score: 30  73 year old female comes in with chief complaint of palpitations.  She is asymptomatic during our evaluation.  She had mild chest discomfort at the time of palpitations.  Plan is to get delta troponin.  Chest x-ray is clear.  EKG showing PVCs, otherwise no acute abnormalities.  If the telemetry monitoring in the ED  stays unremarkable and she will be discharged.  Final Clinical Impressions(s) / ED Diagnoses   Final diagnoses:  Palpitations    ED Discharge Orders    None       Varney Biles, MD 05/01/19 2219

## 2019-04-30 NOTE — ED Notes (Signed)
Patient verbalizes understanding of discharge instructions. Opportunity for questioning and answers were provided. Armband removed by staff, pt discharged from ED.  

## 2019-05-02 ENCOUNTER — Telehealth: Payer: Self-pay

## 2019-05-02 NOTE — Telephone Encounter (Signed)
Copied from [place] [id number]. Topic: Appointment Scheduling - Scheduling Inquiry for Clinic >> May 01, 2019  4:46 PM Mcneil, Jacinto Reap wrote: Reason for CRM: Pt stated that she was experiencing chest palpitations but when she went to the hospital they could not find anything. Pt would like to schedule an appt. Pt requests call back. Cb# [phone]

## 2019-05-02 NOTE — Telephone Encounter (Signed)
LVM for pt to call and schedule an appt °

## 2019-05-02 NOTE — Telephone Encounter (Signed)
Needs virtual visit 

## 2019-05-07 ENCOUNTER — Ambulatory Visit: Payer: Self-pay

## 2019-05-08 ENCOUNTER — Other Ambulatory Visit: Payer: Self-pay

## 2019-05-08 NOTE — Patient Outreach (Signed)
[facility]) Care Management  05/08/2019  ODDIE KUHLMANN 21-Dec-1945 [id number]    5th attempt to outreach the patient for initial assessment.  No answer.  Unable to leave a message due to no voicemail pickup.  Plan: RN Health Coach will make outreach attempt to the patient within 99 business days.  Lazaro Arms RN, BSN, [place] Direct Dial:  [phone]  Fax: [phone]

## 2019-05-12 DIAGNOSIS — G4733 Obstructive sleep apnea (adult) (pediatric): Secondary | ICD-10-CM | POA: Diagnosis not present

## 2019-05-16 ENCOUNTER — Other Ambulatory Visit: Payer: Self-pay | Admitting: Family Medicine

## 2019-05-16 DIAGNOSIS — G4733 Obstructive sleep apnea (adult) (pediatric): Secondary | ICD-10-CM | POA: Diagnosis not present

## 2019-05-24 ENCOUNTER — Ambulatory Visit (INDEPENDENT_AMBULATORY_CARE_PROVIDER_SITE_OTHER): Payer: PPO | Admitting: Family Medicine

## 2019-05-24 ENCOUNTER — Other Ambulatory Visit: Payer: Self-pay

## 2019-05-24 DIAGNOSIS — R002 Palpitations: Secondary | ICD-10-CM | POA: Diagnosis not present

## 2019-05-24 DIAGNOSIS — E782 Mixed hyperlipidemia: Secondary | ICD-10-CM | POA: Diagnosis not present

## 2019-05-24 DIAGNOSIS — E538 Deficiency of other specified B group vitamins: Secondary | ICD-10-CM

## 2019-05-24 DIAGNOSIS — M25572 Pain in left ankle and joints of left foot: Secondary | ICD-10-CM | POA: Diagnosis not present

## 2019-05-24 DIAGNOSIS — E559 Vitamin D deficiency, unspecified: Secondary | ICD-10-CM

## 2019-05-24 DIAGNOSIS — I10 Essential (primary) hypertension: Secondary | ICD-10-CM | POA: Diagnosis not present

## 2019-05-24 DIAGNOSIS — E038 Other specified hypothyroidism: Secondary | ICD-10-CM | POA: Diagnosis not present

## 2019-05-24 MED ORDER — METOPROLOL TARTRATE 25 MG PO TABS: 60.0000 mg | ORAL_TABLET | Freq: Two times a day (BID) | ORAL | 2 refills | Status: DC

## 2019-05-25 NOTE — Progress Notes (Addendum)
Virtual Visit via Video Note  I connected with patient on 05/28/19 at  1:00 PM EDT by audio enabled telemedicine application and verified that I am speaking with the correct person using two identifiers.   THIS ENCOUNTER IS A VIRTUAL VISIT DUE TO COVID-19 - PATIENT WAS NOT SEEN IN THE OFFICE. PATIENT HAS CONSENTED TO VIRTUAL VISIT / TELEMEDICINE VISIT   Location of patient: home  Location of provider: office  I discussed the limitations of evaluation and management by telemedicine and the availability of in person appointments. The patient expressed understanding and agreed to proceed.   Subjective:   Jackie Stevens is a 73 y.o. female who presents for Medicare Annual (Subsequent) preventive examination.  Review of Systems: No ROS.  Medicare Wellness Virtual Visit.  Visual/audio telehealth visit, UTA vital signs.   See social history for additional risk factors. Cardiac Risk Factors include: advanced age (>84men, >38 women);dyslipidemia;hypertension;obesity (BMI >30kg/m2);sedentary lifestyle Sleep patterns:  Wears CPAP. Sleeps well. Home Safety/Smoke Alarms: Feels safe in home. Smoke alarms in place.  Lives with husband and adult son in 2 story home. Son lives downstairs.    Female:     Mammo-  09/01/18     Dexa scan-   08/30/17     CCS- 12/20/13     Objective:     Advanced Directives 05/28/2019 04/29/2019 05/25/2018 05/24/2017 12/02/2015 12/02/2015 11/25/2015  Does Patient Have a Medical Advance Directive? Yes No Yes Yes Yes Yes Yes  Type of Paramedic of [place];Living will - [place];Living will Living will;Healthcare Power of [place];Living will [place];Living will Living will;Healthcare Power of Attorney  Does patient want to make changes to medical advance directive? No - Patient declined - - No - Patient declined - No - Patient declined No - Patient declined  Copy of [place] in Chart? Yes - validated most recent copy scanned in chart (See row information) - Yes Yes No - copy requested No - copy requested No - copy requested  Would patient like information on creating a medical advance directive? - - - - - - -  Pre-existing out of facility DNR order (yellow form or pink MOST form) - - - - - - -    Tobacco Social History   Tobacco Use  Smoking Status Never Smoker  Smokeless Tobacco Never Used     Counseling given: Not Answered   Clinical Intake:     Pain : No/denies pain                 Past Medical History:  Diagnosis Date  . Abnormal cervical cytology 10/25/2012   Follows with Dr Leavy Cella of Gyn  . Allergic rhinitis   . Anemia 10/06/2013  . Anginal pain ([place])    pt has history of CP states had cardiac workup with no specific issues identified   . Anxiety    when increased stress   . Arthritis    "knees; right thumb; shoulders" (12/19/2012)  . Asthma   . Chest pain   . Chicken pox as a child  . Complication of anesthesia   . Constipation   . Decreased hearing   . Dermatitis 03/06/2017  . Diverticulitis 10/25/2012   pt. reports that a drain was placed - 09/2012    . Dry mouth   . Excessive thirst   . External hemorrhoid, bleeding    "sometimes" (12/19/2012)  . Frequent urination   .  GERD (gastroesophageal reflux disease)   . H/O hiatal hernia   . Hand tingling   . Heart murmur   . HTN (hypertension)    stress test completed by Anselm Lis, diagnosed as GERD  . Hyperlipidemia   . Hypothyroidism   . Incontinence   . Infertility, female   . Insomnia   . Joint pain   . Kidney stones 1970's   "passed on their own" (12/23/12)  . Knee pain   . Left shoulder pain 09/14/2016  . Low back pain 06/09/2014  . Measles as a child  . Medicare annual wellness visit, subsequent 10/06/2013   Steinhoffer of Dermatology Pneumovax in 2012   . Memory difficulties 09/05/2017  . Obesity 09/11/2017  . OSA on CPAP   .  Palpitations   . Paresthesia 03/23/2015   Left face  . PONV (postoperative nausea and vomiting)   . Preventative health care 10/06/2013   Steinhoffer of Dermatology Pneumovax in 2012  . Preventative health care 02/26/2016  . Rheumatoid arteritis ([place])   . Shortness of breath dyspnea    using stairs  . Sinus pain   . Sleep apnea   . Swelling of both lower extremities   . Thyroid cancer ([place]) 1980's  . Thyroid disease   . Tinnitus   . UTI (urinary tract infection) 04/02/2013   Past Surgical History:  Procedure Laterality Date  . CHOLECYSTECTOMY  1990  . COLON SURGERY    . COLOSTOMY REVISION  12-23-12   Procedure: COLON RESECTION SIGMOID;  Surgeon: Gwenyth Ober, MD;  Location: [facility];  Service: General;  Laterality: N/A;  . CYSTOSCOPY WITH STENT PLACEMENT  12-23-2012   Procedure: CYSTOSCOPY WITH STENT PLACEMENT;  Surgeon: Hanley Ben, MD;  Location: [facility];  Service: Urology;  Laterality: N/A;  . DILATION AND CURETTAGE OF UTERUS  1960's   "lots of them; had miscarriages" (12/23/2012)  . LYSIS OF ADHESION N/A 12/02/2015   Procedure: LAPAROSCOPIC LYSIS OF ADHESION;  Surgeon: Johnathan Hausen, MD;  Location: [facility];  Service: General;  Laterality: N/A;  . ROBOTIC ASSISTED BILATERAL SALPINGO OOPHERECTOMY Bilateral 12/02/2015   Procedure: XI ROBOTIC ASSISTED BILATERAL SALPINGO OOPHORECTOMY;  Surgeon: Everitt Amber, MD;  Location: [facility];  Service: Gynecology;  Laterality: Bilateral;  . SIGMOID RESECTION / RECTOPEXY  2012/12/23  . THYROIDECTOMY, PARTIAL  1988   "then did iodine to remove the rest" (2012/12/23)  . TONSILLECTOMY  1951?  . TRANSRECTAL DRAINAGE OF PELVIC ABSCESS  10/27/2012  . VAGINAL HYSTERECTOMY  1970's   "still have my ovaries" (12-23-12)   Family History  Problem Relation Age of Onset  . Heart disease Father   . Pneumonia Father   . Hypertension Father   . Hyperlipidemia Father   . Cancer Father        skin  . Stroke Father   . Alzheimer's disease Mother   . Heart disease  Mother   . Depression Mother   . Emphysema Brother        marijuana and cigarettes  . Alcohol abuse Brother   . Hearing loss Brother   . Diabetes Maternal Grandmother   . Alzheimer's disease Paternal Grandmother   . Cancer Paternal Grandmother        lung?- smoker  . Hyperlipidemia Paternal Grandmother   . Heart attack Paternal Grandfather   . Alcohol abuse Paternal Grandfather   . Neurofibromatosis Son        schwanomatosis  . Neurofibromatosis Son        swanomatosis  .  Cancer Paternal Aunt    Social History   Socioeconomic History  . Marital status: Married    Spouse name: [name]  . Number of children: 2  . Years of education: Not on file  . Highest education level: Not on file  Occupational History  . Occupation: Retired  Scientific laboratory technician  . Financial resource strain: Not on file  . Food insecurity    Worry: Not on file    Inability: Not on file  . Transportation needs    Medical: Not on file    Non-medical: Not on file  Tobacco Use  . Smoking status: Never Smoker  . Smokeless tobacco: Never Used  Substance and Sexual Activity  . Alcohol use: No    Alcohol/week: 0.0 standard drinks  . Drug use: No  . Sexual activity: Not Currently  Lifestyle  . Physical activity    Days per week: Not on file    Minutes per session: Not on file  . Stress: Not on file  Relationships  . Social Herbalist on phone: Not on file    Gets together: Not on file    Attends religious service: Not on file    Active member of club or organization: Not on file    Attends meetings of clubs or organizations: Not on file    Relationship status: Not on file  Other Topics Concern  . Not on file  Social History Narrative   Married    children    Outpatient Encounter Medications as of 05/28/2019  Medication Sig  . azelastine (ASTELIN) 0.1 % nasal spray Place 2 sprays into both nostrils at bedtime as needed for rhinitis. Use in each nostril as directed  . docusate sodium (COLACE)  100 MG capsule Take 100 mg by mouth 2 (two) times daily. Reported on 03/30/2016  . fluticasone (FLONASE) 50 MCG/ACT nasal spray Place 2 sprays into both nostrils daily.  . hydrochlorothiazide (HYDRODIURIL) 25 MG tablet TAKE 1 TABLET BY MOUTH EVERY DAY (Patient taking differently: Take 25 mg by mouth daily. )  . levothyroxine (SYNTHROID, LEVOTHROID) 150 MCG tablet Take 150 mcg by mouth daily before breakfast.  . losartan (COZAAR) 100 MG tablet TAKE 1 TABLET BY MOUTH EVERY DAY (Patient taking differently: Take 100 mg by mouth daily. )  . metoprolol tartrate (LOPRESSOR) 25 MG tablet Take 2.5 tablets (62.5 mg total) by mouth 2 (two) times daily.  . Multiple Vitamins-Minerals (MULTIVITAMIN WOMEN 50+ PO) Take 1 tablet by mouth daily.  . naproxen (NAPROSYN) 375 MG tablet TAKE 1 TABLET (375 MG TOTAL) BY MOUTH 2 (TWO) TIMES DAILY WITH A MEAL.  Marland Kitchen omeprazole (PRILOSEC) 40 MG capsule TAKE 1 CAPSULE BY MOUTH TWICE A DAY (Patient taking differently: Take 40 mg by mouth 2 (two) times a day. )  . simvastatin (ZOCOR) 40 MG tablet TAKE 1 TABLET BY MOUTH EVERYDAY AT BEDTIME  . TURMERIC PO Take 1 tablet by mouth daily. Reported on 12/19/2015  . Vitamin D, Cholecalciferol, 1000 UNITS CAPS Take 1 capsule by mouth daily.  Marland Kitchen azelastine (ASTELIN) 0.1 % nasal spray Place 2 sprays into both nostrils 2 (two) times daily. Use in each nostril as directed  . benzonatate (TESSALON) 100 MG capsule Take 1 capsule (100 mg total) by mouth 3 (three) times daily as needed for cough. (Patient not taking: Reported on 04/30/2019)  . famotidine (PEPCID) 40 MG tablet Take 1 tablet (40 mg total) by mouth daily. (Patient not taking: Reported on 05/28/2019)  . hyoscyamine (LEVSIN  SL) 0.125 MG SL tablet Place 1 tablet (0.125 mg total) under the tongue every 4 (four) hours as needed. (Patient not taking: Reported on 04/30/2019)  . KETOCONAZOLE, TOPICAL, 1 % SHAM Apply 1 Dose topically once a week. (Patient not taking: Reported on 05/28/2019)  .  nitroGLYCERIN (NITROSTAT) 0.4 MG SL tablet PLACE 1 TABLET UNDER THE TONGUE EVERY 5 MINUTES AS NEEDED FOR CHEST PAIN (Patient not taking: Reported on 05/28/2019)   No facility-administered encounter medications on file as of 05/28/2019.     Activities of Daily Living In your present state of health, do you have any difficulty performing the following activities: 05/28/2019  Hearing? N  Vision? N  Comment wears glasses.  Difficulty concentrating or making decisions? N  Walking or climbing stairs? N  Dressing or bathing? N  Doing errands, shopping? N  Preparing Food and eating ? N  Using the Toilet? N  In the past six months, have you accidently leaked urine? N  Do you have problems with loss of bowel control? N  Managing your Medications? N  Managing your Finances? N  Housekeeping or managing your Housekeeping? N  Some recent data might be hidden    Patient Care Team: Mosie Lukes, MD as PCP - General (Family Medicine) Marylynn Pearson, MD as Consulting Physician (Obstetrics and Gynecology) Marica Otter, [place] as Consulting Physician (Optometry) Reynold Bowen, MD as Consulting Physician (Endocrinology) Gaynelle Arabian, MD as Consulting Physician (Orthopedic Surgery) Meylor, Marlou Sa, [place] as Consulting Physician (Chiropractic Medicine) Sydnee Levans, MD as Consulting Physician (Dermatology) Lazaro Arms, RN as [place] Management    Assessment:    Goals    . DIET - INCREASE WATER INTAKE     Drink at least 3 glasses of water per day.    . Increase physical activity    . Lose 20 lbs  (pt-stated)       Fall Risk Fall Risk  05/28/2019 05/25/2018 05/24/2017 03/01/2017 09/14/2016  Falls in the past year? 0 No No No No     Depression Screen PHQ 2/9 Scores 05/28/2019 11/03/2018 05/25/2018 02/28/2018  PHQ - 2 Score 0 0 0 0  PHQ- 9 Score - - - 7     Cognitive Function Ad8 score reviewed for issues:  Issues making decisions:no  Less interest in hobbies /  activities:no  Repeats questions, stories (family complaining):no  Trouble using ordinary gadgets (microwave, computer, phone):no  Forgets the month or year: no  Mismanaging finances: no  Remembering appts:no  Daily problems with thinking and/or memory:no Ad8 score is=0   MMSE - Mini Mental State Exam 05/25/2018 05/24/2017 08/20/2015  Orientation to time 5 5 5   Orientation to Place 5 5 5   Registration 3 3 3   Attention/ Calculation 5 5 5   Recall 2 3 3   Language- name 2 objects 2 2 2   Language- repeat 1 1 1   Language- follow 3 step command 3 3 3   Language- read & follow direction 1 1 1   Write a sentence 1 1 1   Copy design 1 1 1   Total score 29 30 30         Immunization History  Administered Date(s) Administered  . Influenza Split 09/12/2012, 10/02/2015  . Influenza, High Dose Seasonal PF 09/14/2016, 09/05/2017, 09/01/2018  . Influenza,inj,Quad PF,6+ Mos 08/30/2013, 10/21/2014, 08/20/2015  . Influenza,inj,quad, With Preservative 09/12/2017  . Pneumococcal Conjugate-13 07/19/2011, 11/01/2014  . Pneumococcal Polysaccharide-23 03/30/2016  . Tdap 10/02/2013  . Zoster 12/09/2008   Screening Tests Health Maintenance  Topic Date  Due  . Hepatitis C Screening  1946/09/14  . INFLUENZA VACCINE  07/14/2019  . MAMMOGRAM  09/01/2020  . TETANUS/TDAP  10/03/2023  . COLONOSCOPY  12/21/2023  . DEXA SCAN  Completed  . PNA vac Low Risk Adult  Completed      Plan:   See you next year!  Continue to eat heart healthy diet (full of fruits, vegetables, whole grains, lean protein, water--limit salt, fat, and sugar intake) and increase physical activity as tolerated.  Continue doing brain stimulating activities (puzzles, reading, adult coloring books, staying active) to keep memory sharp.    I have personally reviewed and noted the following in the patient's chart:   . Medical and social history . Use of alcohol, tobacco or illicit drugs  . Current medications and supplements .  Functional ability and status . Nutritional status . Physical activity . Advanced directives . List of other physicians . Hospitalizations, surgeries, and ER visits in previous 12 months . Vitals . Screenings to include cognitive, depression, and falls . Referrals and appointments  In addition, I have reviewed and discussed with patient certain preventive protocols, quality metrics, and best practice recommendations. A written personalized care plan for preventive services as well as general preventive health recommendations were provided to patient.     Shela Nevin, [place]  05/28/2019   Medical screening examination/treatment was performed by qualified clinical staff member and as supervising physician I was immediately available for consultation/collaboration. I have reviewed documentation and agree with assessment and plan.  Penni Homans, MD

## 2019-05-28 ENCOUNTER — Encounter: Payer: Self-pay | Admitting: *Deleted

## 2019-05-28 ENCOUNTER — Ambulatory Visit (INDEPENDENT_AMBULATORY_CARE_PROVIDER_SITE_OTHER): Payer: PPO | Admitting: *Deleted

## 2019-05-28 ENCOUNTER — Other Ambulatory Visit: Payer: Self-pay

## 2019-05-28 DIAGNOSIS — Z0000 Encounter for general adult medical examination without abnormal findings: Secondary | ICD-10-CM | POA: Diagnosis not present

## 2019-05-28 DIAGNOSIS — R002 Palpitations: Secondary | ICD-10-CM | POA: Insufficient documentation

## 2019-05-28 NOTE — Assessment & Plan Note (Signed)
no changes to meds. Encouraged heart healthy diet such as the DASH diet and exercise as tolerated.  

## 2019-05-28 NOTE — Assessment & Plan Note (Signed)
Was hospitalized recently with symptoms but has had no recurrence since returning home. Will notify us if worsens.

## 2019-05-28 NOTE — Progress Notes (Signed)
Virtual Visit via Video Note  I connected with Jackie Stevens on 05/24/2019 at 11:00 AM EDT by a video enabled telemedicine application and verified that I am speaking with the correct person using two identifiers.  Location: Patient: home Provider: office   I discussed the limitations of evaluation and management by telemedicine and the availability of in person appointments. The patient expressed understanding and agreed to proceed. [name] Eulas Post was abe; tp get patient set up on video visit.     Subjective:    Patient ID: Jackie Stevens, female    DOB: 22-Apr-1946, 73 y.o.   MRN: [id number]  No chief complaint on file.   HPI Patient is in today for follow up after an Emergency Room visit for palpitations. Work up was unremarkable except for mild electrolyte deficiencies and she has had n further difficulty since returning home. No recent febrile illness or hospitalizations. Denies CP/SOB/HA/congestion/fevers/GI or GU c/o. Taking meds as prescribed  Past Medical History:  Diagnosis Date  . Abnormal cervical cytology 10/25/2012   Follows with Dr Leavy Cella of Gyn  . Allergic rhinitis   . Anemia 10/06/2013  . Anginal pain ([place])    pt has history of CP states had cardiac workup with no specific issues identified   . Anxiety    when increased stress   . Arthritis    "knees; right thumb; shoulders" (January 14, 2013)  . Asthma   . Chest pain   . Chicken pox as a child  . Complication of anesthesia   . Constipation   . Decreased hearing   . Dermatitis 03/06/2017  . Diverticulitis 10/25/2012   pt. reports that a drain was placed - 09/2012    . Dry mouth   . Excessive thirst   . External hemorrhoid, bleeding    "sometimes" (2013-01-14)  . Frequent urination   . GERD (gastroesophageal reflux disease)   . H/O hiatal hernia   . Hand tingling   . Heart murmur   . HTN (hypertension)    stress test completed by Anselm Lis, diagnosed as GERD  . Hyperlipidemia   . Hypothyroidism    . Incontinence   . Infertility, female   . Insomnia   . Joint pain   . Kidney stones 1970's   "passed on their own" (01/14/13)  . Knee pain   . Left shoulder pain 09/14/2016  . Low back pain 06/09/2014  . Measles as a child  . Medicare annual wellness visit, subsequent 10/06/2013   Steinhoffer of Dermatology Pneumovax in 2012   . Memory difficulties 09/05/2017  . Obesity 09/11/2017  . OSA on CPAP   . Palpitations   . Paresthesia 03/23/2015   Left face  . PONV (postoperative nausea and vomiting)   . Preventative health care 10/06/2013   Ste[facility]ffer of Dermat[facility] Pneumovax in 2012  . Preventative health care 02/26/2016  . Rheumatoid arteritis ([place])   . Shortness of breath dyspnea    using stairs  . Sinus pain   . Sleep apnea   . Swelling of both lower extremities   . Thyroid cancer ([place]) 1980's  . Thyroid disease   . Tinnitus   . UTI (urinary tract infection) 04/02/2013    Past Surgical History:  Procedure Laterality Date  . CHOLECYSTECTOMY  1990  . COLON SURGERY    . COLOSTOMY REVISION  Jan 14, 2013   Procedure: COLON RESECTION SIGMOID;  Surgeon: Gwenyth Ober, MD;  Location: [facility];  Service: General;  Laterality: N/A;  . CYSTOSCOPY WITH STENT  PLACEMENT  12/19/2012   Procedure: CYSTOSCOPY WITH STENT PLACEMENT;  Surgeon: Hanley Ben, MD;  Location: [facility];  Service: Urology;  Laterality: N/A;  . DILATION AND CURETTAGE OF UTERUS  1960's   "lots of them; had miscarriages" (12/19/2012)  . LYSIS OF ADHESION N/A 12/02/2015   Procedure: LAPAROSCOPIC LYSIS OF ADHESION;  Surgeon: Johnathan Hausen, MD;  Location: [facility];  Service: General;  Laterality: N/A;  . ROBOTIC ASSISTED BILATERAL SALPINGO OOPHERECTOMY Bilateral 12/02/2015   Procedure: XI ROBOTIC ASSISTED BILATERAL SALPINGO OOPHORECTOMY;  Surgeon: Everitt Amber, MD;  Location: [facility];  Service: Gynecology;  Laterality: Bilateral;  . SIGMOID RESECTION / RECTOPEXY  12/19/2012  . THYROIDECTOMY, PARTIAL  1988   "then did iodine to remove the  rest" (12/19/2012)  . TONSILLECTOMY  1951?  . TRANSRECTAL DRAINAGE OF PELVIC ABSCESS  10/27/2012  . VAGINAL HYSTERECTOMY  1970's   "still have my ovaries" (12/19/2012)    Family History  Problem Relation Age of Onset  . Heart disease Father   . Pneumonia Father   . Hypertension Father   . Hyperlipidemia Father   . Cancer Father        skin  . Stroke Father   . Alzheimer's disease Mother   . Heart disease Mother   . Depression Mother   . Emphysema Brother        marijuana and cigarettes  . Alcohol abuse Brother   . Hearing loss Brother   . Diabetes Maternal Grandmother   . Alzheimer's disease Paternal Grandmother   . Cancer Paternal Grandmother        lung?- smoker  . Hyperlipidemia Paternal Grandmother   . Heart attack Paternal Grandfather   . Alcohol abuse Paternal Grandfather   . Neurofibromatosis Son        schwanomatosis  . Neurofibromatosis Son        swanomatosis  . Cancer Paternal Aunt     Social History   Socioeconomic History  . Marital status: Married    Spouse name: [name]  . Number of children: 2  . Years of education: Not on file  . Highest education level: Not on file  Occupational History  . Occupation: Retired  Scientific laboratory technician  . Financial resource strain: Not on file  . Food insecurity    Worry: Not on file    Inability: Not on file  . Transportation needs    Medical: Not on file    Non-medical: Not on file  Tobacco Use  . Smoking status: Never Smoker  . Smokeless tobacco: Never Used  Substance and Sexual Activity  . Alcohol use: No    Alcohol/week: 0.0 standard drinks  . Drug use: No  . Sexual activity: Not Currently  Lifestyle  . Physical activity    Days per week: Not on file    Minutes per session: Not on file  . Stress: Not on file  Relationships  . Social Herbalist on phone: Not on file    Gets together: Not on file    Attends religious service: Not on file    Active member of club or organization: Not on file     Attends meetings of clubs or organizations: Not on file    Relationship status: Not on file  . Intimate partner violence    Fear of current or ex partner: Not on file    Emotionally abused: Not on file    Physically abused: Not on file    Forced sexual activity: Not on  file  Other Topics Concern  . Not on file  Social History Narrative   Married    children    Outpatient Medications Prior to Visit  Medication Sig Dispense Refill  . azelastine (ASTELIN) 0.1 % nasal spray Place 2 sprays into both nostrils at bedtime as needed for rhinitis. Use in each nostril as directed 30 mL 3  . azelastine (ASTELIN) 0.1 % nasal spray Place 2 sprays into both nostrils 2 (two) times daily. Use in each nostril as directed 30 mL 3  . benzonatate (TESSALON) 100 MG capsule Take 1 capsule (100 mg total) by mouth 3 (three) times daily as needed for cough. (Patient not taking: Reported on 04/30/2019) 30 capsule 0  . docusate sodium (COLACE) 100 MG capsule Take 100 mg by mouth 2 (two) times daily. Reported on 03/30/2016    . famotidine (PEPCID) 40 MG tablet Take 1 tablet (40 mg total) by mouth daily. (Patient not taking: Reported on 05/28/2019) 90 tablet 1  . fluticasone (FLONASE) 50 MCG/ACT nasal spray Place 2 sprays into both nostrils daily. 48 g 3  . hydrochlorothiazide (HYDRODIURIL) 25 MG tablet TAKE 1 TABLET BY MOUTH EVERY DAY (Patient taking differently: Take 25 mg by mouth daily. ) 90 tablet 1  . hyoscyamine (LEVSIN SL) 0.125 MG SL tablet Place 1 tablet (0.125 mg total) under the tongue every 4 (four) hours as needed. (Patient not taking: Reported on 04/30/2019) 30 tablet 1  . KETOCONAZOLE, TOPICAL, 1 % SHAM Apply 1 Dose topically once a week. (Patient not taking: Reported on 05/28/2019) 200 mL 1  . levothyroxine (SYNTHROID, LEVOTHROID) 150 MCG tablet Take 150 mcg by mouth daily before breakfast.    . losartan (COZAAR) 100 MG tablet TAKE 1 TABLET BY MOUTH EVERY DAY (Patient taking differently: Take 100 mg by mouth  daily. ) 90 tablet 1  . Multiple Vitamins-Minerals (MULTIVITAMIN WOMEN 50+ PO) Take 1 tablet by mouth daily.    . naproxen (NAPROSYN) 375 MG tablet TAKE 1 TABLET (375 MG TOTAL) BY MOUTH 2 (TWO) TIMES DAILY WITH A MEAL. 180 tablet 1  . nitroGLYCERIN (NITROSTAT) 0.4 MG SL tablet PLACE 1 TABLET UNDER THE TONGUE EVERY 5 MINUTES AS NEEDED FOR CHEST PAIN (Patient not taking: Reported on 05/28/2019) 25 tablet 1  . omeprazole (PRILOSEC) 40 MG capsule TAKE 1 CAPSULE BY MOUTH TWICE A DAY (Patient taking differently: Take 40 mg by mouth 2 (two) times a day. ) 180 capsule 1  . simvastatin (ZOCOR) 40 MG tablet TAKE 1 TABLET BY MOUTH EVERYDAY AT BEDTIME 90 tablet 1  . TURMERIC PO Take 1 tablet by mouth daily. Reported on 12/19/2015    . Vitamin D, Cholecalciferol, 1000 UNITS CAPS Take 1 capsule by mouth daily.    Marland Kitchen amLODipine (NORVASC) 2.5 MG tablet Take 2.5 mg by mouth daily.     No facility-administered medications prior to visit.     Allergies  Allergen Reactions  . Neomycin-Bacitracin Zn-Polymyx Rash    Polysporin- is tolerated   . Niacin Other (See Comments) and Cough    "cough til I threw up" (12/19/2012)  . Ciprofloxacin Hives    Got cipro and flagyl at same time, localized hives to IV arm  . Flagyl [Metronidazole] Hives    Got cipro and flagyl at same time, localized hives to IV arm    Review of Systems  Constitutional: Negative for fever and malaise/fatigue.  HENT: Negative for congestion.   Eyes: Negative for blurred vision.  Respiratory: Negative for shortness of breath.  Cardiovascular: Positive for palpitations. Negative for chest pain and leg swelling.  Gastrointestinal: Negative for abdominal pain, blood in stool and nausea.  Genitourinary: Negative for dysuria and frequency.  Musculoskeletal: Negative for falls.  Skin: Negative for rash.  Neurological: Negative for dizziness, loss of consciousness and headaches.  Endo/Heme/Allergies: Negative for environmental allergies.   Psychiatric/Behavioral: Negative for depression. The patient is not nervous/anxious.        Objective:    Physical Exam Constitutional:      Appearance: Normal appearance.  HENT:     Head: Normocephalic and atraumatic.     Nose: Nose normal.  Eyes:     General:        Right eye: No discharge.        Left eye: No discharge.  Pulmonary:     Effort: Pulmonary effort is normal.  Neurological:     Mental Status: She is alert and oriented to person, place, and time.  Psychiatric:        Mood and Affect: Mood normal.        Behavior: Behavior normal.     There were no vitals taken for this visit. Wt Readings from Last 3 Encounters:  04/29/19 180 lb (81.6 kg)  02/05/19 168 lb 6.4 oz (76.4 kg)  01/08/19 170 lb 4 oz (77.2 kg)    Diabetic Foot Exam - Simple   No data filed     Lab Results  Component Value Date   WBC 8.2 04/29/2019   HGB 13.6 04/29/2019   HCT 40.0 04/29/2019   PLT 230 04/29/2019   GLUCOSE 117 (H) 04/29/2019   CHOL 154 11/07/2018   TRIG 78.0 11/07/2018   HDL 57.70 11/07/2018   LDLCALC 81 11/07/2018   ALT 17 11/07/2018   AST 19 11/07/2018   NA 136 04/29/2019   K 3.4 (L) 04/29/2019   CL 99 04/29/2019   CREATININE 0.70 04/29/2019   BUN 20 04/29/2019   CO2 26 04/29/2019   TSH 0.88 11/07/2018   INR 0.99 03/16/2015   HGBA1C 5.6 11/07/2018    Lab Results  Component Value Date   TSH 0.88 11/07/2018   Lab Results  Component Value Date   WBC 8.2 04/29/2019   HGB 13.6 04/29/2019   HCT 40.0 04/29/2019   MCV 89.8 04/29/2019   PLT 230 04/29/2019   Lab Results  Component Value Date   NA 136 04/29/2019   K 3.4 (L) 04/29/2019   CO2 26 04/29/2019   GLUCOSE 117 (H) 04/29/2019   BUN 20 04/29/2019   CREATININE 0.70 04/29/2019   BILITOT 0.3 11/07/2018   ALKPHOS 81 11/07/2018   AST 19 11/07/2018   ALT 17 11/07/2018   PROT 7.1 11/07/2018   ALBUMIN 4.6 11/07/2018   CALCIUM 8.9 04/29/2019   ANIONGAP 10 04/29/2019   GFR 92.99 11/07/2018   Lab  Results  Component Value Date   CHOL 154 11/07/2018   Lab Results  Component Value Date   HDL 57.70 11/07/2018   Lab Results  Component Value Date   LDLCALC 81 11/07/2018   Lab Results  Component Value Date   TRIG 78.0 11/07/2018   Lab Results  Component Value Date   CHOLHDL 3 11/07/2018   Lab Results  Component Value Date   HGBA1C 5.6 11/07/2018       Assessment & Plan:   Problem List Items Addressed This Visit    Hyperlipidemia, mixed    Encouraged heart healthy diet, increase exercise, avoid trans fats, consider a krill  oil cap daily      Relevant Medications   metoprolol tartrate (LOPRESSOR) 25 MG tablet   Essential hypertension     no changes to meds. Encouraged heart healthy diet such as the DASH diet and exercise as tolerated.       Relevant Medications   metoprolol tartrate (LOPRESSOR) 25 MG tablet   Hypothyroidism    On Levothyroxine, continue to monitor      Relevant Medications   metoprolol tartrate (LOPRESSOR) 25 MG tablet   Vitamin B12 deficiency    Supplement and monitor      Pain of joint of left ankle and foot    Drop Naproxen down to prn. Stay as active as possible      Vitamin D deficiency    Supplement and monitor      Palpitations    Was hospitalized recently with symptoms but has had no recurrence since returning home. Will notify us if worsens.          I have discontinued Jocelynn P. Marlatt's amLODipine. I am also having her start on metoprolol tartrate. Additionally, I am having her maintain her TURMERIC PO, Vitamin D (Cholecalciferol), docusate sodium, fluticasone, Multiple Vitamins-Minerals (MULTIVITAMIN WOMEN 50+ PO), KETOCONAZOLE (TOPICAL), levothyroxine, nitroGLYCERIN, hyoscyamine, omeprazole, hydrochlorothiazide, losartan, azelastine, naproxen, azelastine, benzonatate, famotidine, and simvastatin.  Meds ordered this encounter  Medications  . metoprolol tartrate (LOPRESSOR) 25 MG tablet    Sig: Take 2.5 tablets (62.5 mg  total) by mouth 2 (two) times daily.    Dispense:  60 tablet    Refill:  2     I discussed the assessment and treatment plan with the patient. The patient was provided an opportunity to ask questions and all were answered. The patient agreed with the plan and demonstrated an understanding of the instructions.   The patient was advised to call back or seek an in-person evaluation if the symptoms worsen or if the condition fails to improve as anticipated.  I provided 25 minutes of non-face-to-face time during this encounter.   Penni Homans, MD

## 2019-05-28 NOTE — Assessment & Plan Note (Signed)
Supplement and monitor 

## 2019-05-28 NOTE — Assessment & Plan Note (Signed)
Drop Naproxen down to prn. Stay as active as possible

## 2019-05-28 NOTE — Patient Instructions (Signed)
See you next year!  Continue to eat heart healthy diet (full of fruits, vegetables, whole grains, lean protein, water--limit salt, fat, and sugar intake) and increase physical activity as tolerated.  Continue doing brain stimulating activities (puzzles, reading, adult coloring books, staying active) to keep memory sharp.    Jackie Stevens , Thank you for taking time to come for your Medicare Wellness Visit. I appreciate your ongoing commitment to your health goals. Please review the following plan we discussed and let me know if I can assist you in the future.   These are the goals we discussed: Goals    . DIET - INCREASE WATER INTAKE     Drink at least 3 glasses of water per day.    . Increase physical activity    . Lose 20 lbs  (pt-stated)       This is a list of the screening recommended for you and due dates:  Health Maintenance  Topic Date Due  .  Hepatitis C: One time screening is recommended by Center for Disease Control  (CDC) for  adults born from [birth year] through 1967.   22-Sep-1946  . Flu Shot  07/14/2019  . Mammogram  09/01/2020  . Tetanus Vaccine  10/03/2023  . Colon Cancer Screening  12/21/2023  . DEXA scan (bone density measurement)  Completed  . Pneumonia vaccines  Completed    Health Maintenance After Age 39 After age 68, you are at a higher risk for certain long-term diseases and infections as well as injuries from falls. Falls are a major cause of broken bones and head injuries in people who are older than age 62. Getting regular preventive care can help to keep you healthy and well. Preventive care includes getting regular testing and making lifestyle changes as recommended by your health care provider. Talk with your health care provider about:  Which screenings and tests you should have. A screening is a test that checks for a disease when you have no symptoms.  A diet and exercise plan that is right for you. What should I know about screenings and tests to prevent  falls? Screening and testing are the best ways to find a health problem early. Early diagnosis and treatment give you the best chance of managing medical conditions that are common after age 30. Certain conditions and lifestyle choices may make you more likely to have a fall. Your health care provider may recommend:  Regular vision checks. Poor vision and conditions such as cataracts can make you more likely to have a fall. If you wear glasses, make sure to get your prescription updated if your vision changes.  Medicine review. Work with your health care provider to regularly review all of the medicines you are taking, including over-the-counter medicines. Ask your health care provider about any side effects that may make you more likely to have a fall. Tell your health care provider if any medicines that you take make you feel dizzy or sleepy.  Osteoporosis screening. Osteoporosis is a condition that causes the bones to get weaker. This can make the bones weak and cause them to break more easily.  Blood pressure screening. Blood pressure changes and medicines to control blood pressure can make you feel dizzy.  Strength and balance checks. Your health care provider may recommend certain tests to check your strength and balance while standing, walking, or changing positions.  Foot health exam. Foot pain and numbness, as well as not wearing proper footwear, can make you more likely to  have a fall.  Depression screening. You may be more likely to have a fall if you have a fear of falling, feel emotionally low, or feel unable to do activities that you used to do.  Alcohol use screening. Using too much alcohol can affect your balance and may make you more likely to have a fall. What actions can I take to lower my risk of falls? General instructions  Talk with your health care provider about your risks for falling. Tell your health care provider if: ? You fall. Be sure to tell your health care  provider about all falls, even ones that seem minor. ? You feel dizzy, sleepy, or off-balance.  Take over-the-counter and prescription medicines only as told by your health care provider. These include any supplements.  Eat a healthy diet and maintain a healthy weight. A healthy diet includes low-fat dairy products, low-fat (lean) meats, and fiber from whole grains, beans, and lots of fruits and vegetables. Home safety  Remove any tripping hazards, such as rugs, cords, and clutter.  Install safety equipment such as grab bars in bathrooms and safety rails on stairs.  Keep rooms and walkways well-lit. Activity   Follow a regular exercise program to stay fit. This will help you maintain your balance. Ask your health care provider what types of exercise are appropriate for you.  If you need a cane or walker, use it as recommended by your health care provider.  Wear supportive shoes that have nonskid soles. Lifestyle  Do not drink alcohol if your health care provider tells you not to drink.  If you drink alcohol, limit how much you have: ? 0-1 drink a day for women. ? 0-2 drinks a day for men.  Be aware of how much alcohol is in your drink. In the [place] one drink equals one typical bottle of beer (12 oz), one-half glass of wine (5 oz), or one shot of hard liquor (1 oz).  Do not use any products that contain nicotine or tobacco, such as cigarettes and e-cigarettes. If you need help quitting, ask your health care provider. Summary  Having a healthy lifestyle and getting preventive care can help to protect your health and wellness after age 64.  Screening and testing are the best way to find a health problem early and help you avoid having a fall. Early diagnosis and treatment give you the best chance for managing medical conditions that are more common for people who are older than age 51.  Falls are a major cause of broken bones and head injuries in people who are older than age 43.  Take precautions to prevent a fall at home.  Work with your health care provider to learn what changes you can make to improve your health and wellness and to prevent falls. This information is not intended to replace advice given to you by your health care provider. Make sure you discuss any questions you have with your health care provider. Document Released: 10/12/2017 Document Revised: 10/12/2017 Document Reviewed: 10/12/2017 Elsevier Interactive Patient Education  2019 [employer].

## 2019-05-28 NOTE — Assessment & Plan Note (Signed)
On Levothyroxine, continue to monitor 

## 2019-05-28 NOTE — Assessment & Plan Note (Signed)
Encouraged heart healthy diet, increase exercise, avoid trans fats, consider a krill oil cap daily 

## 2019-05-29 ENCOUNTER — Ambulatory Visit (INDEPENDENT_AMBULATORY_CARE_PROVIDER_SITE_OTHER): Payer: PPO | Admitting: Internal Medicine

## 2019-05-29 ENCOUNTER — Encounter: Payer: Self-pay | Admitting: Internal Medicine

## 2019-05-29 ENCOUNTER — Other Ambulatory Visit: Payer: Self-pay

## 2019-05-29 DIAGNOSIS — G4733 Obstructive sleep apnea (adult) (pediatric): Secondary | ICD-10-CM

## 2019-05-29 DIAGNOSIS — F5101 Primary insomnia: Secondary | ICD-10-CM | POA: Diagnosis not present

## 2019-05-29 NOTE — Patient Instructions (Signed)
We can continue CPAP auto 5-15, mask of choice, humidifier, supplies, AirView/ card  Please call if we can help

## 2019-05-29 NOTE — Progress Notes (Signed)
Subjective:    Patient ID: Jackie Stevens, female    DOB: [birth year], 73 y.o.   MRN: [id number]  HPI followed for obstructive sleep apnea complicated by GERD, HBP, allergic rhinitis, obesity NPSG  07/15/10--AHI 13.5/hour, desaturation to 87%, body weight 215 pounds PSG   ---------------------------------------------------------------------------------  12/26/2018- 73 year old female never smoker followed for OSA, complicated by chronic cough, GERD, HBP, allergic rhinitis CPAP 10/APS -----OSA: DME APS. Pt wears CPAP nightly but not able to get DL today-not in AirView as she has had machine several years and no SD Card.  Body weight today 172 pounds She reports doing very well with CPAP, using it all night every night.  Admits she finds it annoying so we talked about alternatives, especially oral appliances.  Machine may be eligible for replacement but we will make that decision after she learns more about oral appliance alternative. CXR 10/26/2018- IMPRESSION: Minimal right basilar atelectasis noted. Lungs otherwise clear.  Review of Systems-see HPI + = positive Constitutional:   No-   weight loss, night sweats, fevers, chills, fatigue, lassitude. HEENT:   No-  headaches, difficulty swallowing, tooth/dental problems, sore throat,         No-sneezing, itching, ear ache,no- nasal congestion, post nasal drip,  CV:  No-   chest pain, orthopnea, PND, swelling in lower extremities, anasarca, dizziness, palpitations Resp: No-   shortness of breath with exertion or at rest.              No-   productive cough,   non-productive cough,  No-  coughing up of blood.              No-   change in color of mucus.  No- wheezing.   Skin: No-   rash or lesions. GI:  No-   heartburn, indigestion, abdominal pain, nausea, vomiting,  GU:  MS:  No-   joint pain or swelling.   Neuro- nothing unusual Psych:  No- change in mood or affect. No depression or anxiety.  No memory loss.   Objective:   Physical  Exam General- Alert, Oriented, Affect-appropriate, Distress- none acute, + Morbidly obese Skin- rash-none, lesions- none, excoriation- none Lymphadenopathy- none Head- atraumatic            Eyes- Gross vision intact, PERRLA, conjunctivae clear secretions            Ears- Hearing, canals slightly retracted, no fluid            Nose- Clear, No-Septal dev, mucus, polyps, erosion, perforation             Throat- Mallampati III-IV , mucosa clear- not red , drainage- none, tonsils- atrophic, not hoarse Neck- flexible , trachea midline, no stridor , thyroid nl, carotid no bruit Chest - symmetrical excursion , unlabored           Heart/CV- RRR , murmur+ 1/6 LUSB , no gallop  , no rub, nl s1 s2                           - JVD- none , edema- none, stasis changes- none, varices- none           Lung- clear to P&A, wheeze- none, cough- none , dullness-none, rub- none           Chest wall-  Abd-  Br/ Gen/ Rectal- Not done, not indicated Extrem- cyanosis- none, clubbing, none, atrophy- none, strength- nl Neuro- grossly intact to observation  05/29/2019- Virtual Visit via Telephone Note  I connected with [name] on 05/29/19 at 10:30 AM EDT by telephone and verified that I am speaking with the correct person using two identifiers.  Location: Patient: H Provider: O   I discussed the limitations, risks, security and privacy concerns of performing an evaluation and management service by telephone and the availability of in person appointments. I also discussed with the patient that there may be a patient responsible charge related to this service. The patient expressed understanding and agreed to proceed.   History of Present Illness:  73 year old female never smoker followed for OSA, complicated by chronic cough, GERD, HBP, allergic rhinitis CPAP auto 5-15/APS  Replaced CPAP machine 4/ 2020 -----OSA on CPAP; DME: APS, states she is using it every night, no complaints She reports doing very  well with no problems using her replacement CPAP machine. Settings are comfortable.  Wakes occasionally at 4:00AM, too late to take a sleep aide. Discussed sleep hygiene, occasional nap.  Observations/Objective:   Assessment and Plan: OSA- good compliance and control by report. Can't access download card with televisit. Continue auto 5-15  Follow Up Instructions: 1 year   I discussed the assessment and treatment plan with the patient. The patient was provided an opportunity to ask questions and all were answered. The patient agreed with the plan and demonstrated an understanding of the instructions.   The patient was advised to call back or seek an in-person evaluation if the symptoms worsen or if the condition fails to improve as anticipated.  I provided 18 minutes of non-face-to-face time during this encounter.   Baird Lyons, MD     Assessment & Plan:

## 2019-05-29 NOTE — Assessment & Plan Note (Signed)
Benefits from CPAP with good compliance and control Plan- continue auto 5-15, mask of choice, supplies, humidifier, AirView/ card

## 2019-05-29 NOTE — Assessment & Plan Note (Signed)
Early morning waking. Discussed sleep hygiene, occasional nap.

## 2019-05-31 ENCOUNTER — Other Ambulatory Visit: Payer: Self-pay

## 2019-05-31 NOTE — Patient Outreach (Signed)
[facility]) Care Management  05/31/2019  Jackie Stevens 04-03-1946 [id number]    RN Health Coach closing the program.  Patient is transitioning to external program [facility] for continued case management.   Lazaro Arms RN, BSN, [place] Direct Dial:  [phone]  Fax: [phone]

## 2019-06-12 DIAGNOSIS — G4733 Obstructive sleep apnea (adult) (pediatric): Secondary | ICD-10-CM | POA: Diagnosis not present

## 2019-06-14 ENCOUNTER — Other Ambulatory Visit: Payer: Self-pay | Admitting: Family Medicine

## 2019-06-18 ENCOUNTER — Ambulatory Visit: Payer: PPO | Admitting: Podiatry

## 2019-06-18 ENCOUNTER — Other Ambulatory Visit: Payer: Self-pay

## 2019-06-18 ENCOUNTER — Encounter: Payer: Self-pay | Admitting: Podiatry

## 2019-06-18 VITALS — Temp 98.3°F

## 2019-06-18 DIAGNOSIS — M79675 Pain in left toe(s): Secondary | ICD-10-CM | POA: Diagnosis not present

## 2019-06-18 DIAGNOSIS — M79674 Pain in right toe(s): Secondary | ICD-10-CM | POA: Diagnosis not present

## 2019-06-18 DIAGNOSIS — B351 Tinea unguium: Secondary | ICD-10-CM

## 2019-06-18 DIAGNOSIS — Q828 Other specified congenital malformations of skin: Secondary | ICD-10-CM | POA: Diagnosis not present

## 2019-06-18 DIAGNOSIS — M79676 Pain in unspecified toe(s): Secondary | ICD-10-CM

## 2019-06-18 NOTE — Patient Instructions (Signed)

## 2019-06-19 NOTE — Progress Notes (Signed)
Subjective: Jackie Stevens presents to clinic with cc of painful mycotic toenails and painful plantar callosities of both feet which are aggravated when weightbearing with and without shoe gear.  This pain limits her daily activities. Pain symptoms resolve with periodic professional debridement.  Mosie Lukes, MD is her PCP.   Current Outpatient Medications:  .  azelastine (ASTELIN) 0.1 % nasal spray, Place 2 sprays into both nostrils at bedtime as needed for rhinitis. Use in each nostril as directed, Disp: 30 mL, Rfl: 3 .  benzonatate (TESSALON) 100 MG capsule, Take 1 capsule (100 mg total) by mouth 3 (three) times daily as needed for cough., Disp: 30 capsule, Rfl: 0 .  docusate sodium (COLACE) 100 MG capsule, Take 100 mg by mouth 2 (two) times daily. Reported on 03/30/2016, Disp: , Rfl:  .  famotidine (PEPCID) 40 MG tablet, Take 1 tablet (40 mg total) by mouth daily., Disp: 90 tablet, Rfl: 1 .  fluticasone (FLONASE) 50 MCG/ACT nasal spray, Place 2 sprays into both nostrils daily., Disp: 48 g, Rfl: 3 .  hydrochlorothiazide (HYDRODIURIL) 25 MG tablet, TAKE 1 TABLET BY MOUTH EVERY DAY (Patient taking differently: Take 25 mg by mouth daily. ), Disp: 90 tablet, Rfl: 1 .  hyoscyamine (LEVSIN SL) 0.125 MG SL tablet, Place 1 tablet (0.125 mg total) under the tongue every 4 (four) hours as needed., Disp: 30 tablet, Rfl: 1 .  KETOCONAZOLE, TOPICAL, 1 % SHAM, Apply 1 Dose topically once a week., Disp: 200 mL, Rfl: 1 .  levothyroxine (SYNTHROID, LEVOTHROID) 150 MCG tablet, Take 150 mcg by mouth daily before breakfast., Disp: , Rfl:  .  losartan (COZAAR) 100 MG tablet, TAKE 1 TABLET BY MOUTH EVERY DAY (Patient taking differently: Take 100 mg by mouth daily. ), Disp: 90 tablet, Rfl: 1 .  metoprolol tartrate (LOPRESSOR) 25 MG tablet, TAKE 2&1/2 TABLETS (62.5 MG TOTAL) BY MOUTH 2 (TWO) TIMES DAILY., Disp: 60 tablet, Rfl: 2 .  Multiple Vitamins-Minerals (MULTIVITAMIN WOMEN 50+ PO), Take 1 tablet by mouth  daily., Disp: , Rfl:  .  naproxen (NAPROSYN) 375 MG tablet, TAKE 1 TABLET (375 MG TOTAL) BY MOUTH 2 (TWO) TIMES DAILY WITH A MEAL., Disp: 180 tablet, Rfl: 1 .  nitroGLYCERIN (NITROSTAT) 0.4 MG SL tablet, PLACE 1 TABLET UNDER THE TONGUE EVERY 5 MINUTES AS NEEDED FOR CHEST PAIN, Disp: 25 tablet, Rfl: 1 .  omeprazole (PRILOSEC) 40 MG capsule, TAKE 1 CAPSULE BY MOUTH TWICE A DAY (Patient taking differently: Take 40 mg by mouth 2 (two) times a day. ), Disp: 180 capsule, Rfl: 1 .  simvastatin (ZOCOR) 40 MG tablet, TAKE 1 TABLET BY MOUTH EVERYDAY AT BEDTIME, Disp: 90 tablet, Rfl: 1 .  TURMERIC PO, Take 1 tablet by mouth daily. Reported on 12/19/2015, Disp: , Rfl:  .  Vitamin D, Cholecalciferol, 1000 UNITS CAPS, Take 1 capsule by mouth daily., Disp: , Rfl:    Allergies  Allergen Reactions  . Neomycin-Bacitracin Zn-Polymyx Rash    Polysporin- is tolerated   . Niacin Other (See Comments) and Cough    "cough til I threw up" (12/19/2012)  . Ciprofloxacin Hives    Got cipro and flagyl at same time, localized hives to IV arm  . Flagyl [Metronidazole] Hives    Got cipro and flagyl at same time, localized hives to IV arm     Objective: Vitals:   06/18/19 1449  Temp: 98.3 F (36.8 C)    Physical Examination:  Vascular  Examination: Capillary refill time less than 3  seconds x 10 digits.  Palpable DP/PT pulses b/l.  Digital hair present b/l.  No edema noted b/l.  Skin temperature gradient WNL b/l.  Dermatological Examination: Skin with normal turgor, texture and tone b/l.  No open wounds b/l.  No interdigital macerations noted b/l.  Elongated, thick, discolored brittle toenails with subungual debris and pain on dorsal palpation of nailbeds 1-5 b/l.  Porokeratotic lesions submet head 2 right foot, lateral fourth PIPJ left foot, medial DIPJ right second digit all with with tenderness to palpation. No erythema, no edema, no drainage, no flocculence.  Musculoskeletal Examination: Muscle  strength 5/5 to all muscle groups b/l.  HAV with bunion deformity bilaterally  Overlapping hammertoe second digit bilaterally  No pain, crepitus or joint discomfort with active/passive ROM.  Neurological Examination: Sensation intact 5/5 b/l with 10 gram monofilament.  Vibratory sensation intact b/l.  Proprioceptive sensation intact b/l.  Assessment: 1. Mycotic nail infection with pain 1-5 b/l 2. Painful porokeratoses sub-met head 2 right foot,  lateral fourth PIPJ left foot and medial DIPJ right second digit  Plan: 1. Toenails 1-5 b/l were debrided in length and girth without iatrogenic laceration. 2. Painful porokeratotoc lesion pared sub-met head 2 right foot,  lateral fourth PIPJ left foot and medial DIPJ right second digit. 3. Continue soft, supportive shoe gear daily. 4. Report any pedal injuries to medical professional. 5. Follow up 3 months. 6. Patient/POA to call should there be a question/concern in there interim.

## 2019-06-26 ENCOUNTER — Other Ambulatory Visit: Payer: Self-pay | Admitting: Family Medicine

## 2019-06-26 DIAGNOSIS — K219 Gastro-esophageal reflux disease without esophagitis: Secondary | ICD-10-CM

## 2019-06-26 DIAGNOSIS — R05 Cough: Secondary | ICD-10-CM

## 2019-06-26 DIAGNOSIS — J302 Other seasonal allergic rhinitis: Secondary | ICD-10-CM

## 2019-06-26 DIAGNOSIS — R059 Cough, unspecified: Secondary | ICD-10-CM

## 2019-07-05 ENCOUNTER — Ambulatory Visit: Payer: Self-pay

## 2019-07-09 ENCOUNTER — Other Ambulatory Visit: Payer: Self-pay | Admitting: Family Medicine

## 2019-07-11 DIAGNOSIS — G4733 Obstructive sleep apnea (adult) (pediatric): Secondary | ICD-10-CM | POA: Diagnosis not present

## 2019-07-12 DIAGNOSIS — G4733 Obstructive sleep apnea (adult) (pediatric): Secondary | ICD-10-CM | POA: Diagnosis not present

## 2019-07-17 ENCOUNTER — Other Ambulatory Visit: Payer: Self-pay

## 2019-07-17 ENCOUNTER — Ambulatory Visit: Payer: Self-pay | Admitting: Family Medicine

## 2019-07-17 ENCOUNTER — Emergency Department (HOSPITAL_COMMUNITY): Payer: PPO

## 2019-07-17 ENCOUNTER — Emergency Department (HOSPITAL_COMMUNITY)
Admission: EM | Admit: 2019-07-17 | Discharge: 2019-07-17 | Disposition: A | Discharge status: Home / Self Care | Payer: PPO | Attending: Emergency Medicine | Admitting: Emergency Medicine

## 2019-07-17 ENCOUNTER — Encounter (HOSPITAL_COMMUNITY): Payer: Self-pay | Admitting: Emergency Medicine

## 2019-07-17 DIAGNOSIS — Z79899 Other long term (current) drug therapy: Secondary | ICD-10-CM | POA: Insufficient documentation

## 2019-07-17 DIAGNOSIS — R269 Unspecified abnormalities of gait and mobility: Secondary | ICD-10-CM | POA: Diagnosis not present

## 2019-07-17 DIAGNOSIS — E039 Hypothyroidism, unspecified: Secondary | ICD-10-CM | POA: Insufficient documentation

## 2019-07-17 DIAGNOSIS — R42 Dizziness and giddiness: Secondary | ICD-10-CM | POA: Diagnosis not present

## 2019-07-17 DIAGNOSIS — N390 Urinary tract infection, site not specified: Secondary | ICD-10-CM | POA: Diagnosis not present

## 2019-07-17 DIAGNOSIS — Z8709 Personal history of other diseases of the respiratory system: Secondary | ICD-10-CM | POA: Diagnosis not present

## 2019-07-17 LAB — BASIC METABOLIC PANEL
Anion gap: 12 (ref 5–15)
BUN: 16 mg/dL (ref 8–23)
CO2: 24 mmol/L (ref 22–32)
Calcium: 8.8 mg/dL — ABNORMAL LOW (ref 8.9–10.3)
Chloride: 100 mmol/L (ref 98–111)
Creatinine, Ser: 0.64 mg/dL (ref 0.44–1.00)
GFR calc Af Amer: >60 mL/min (ref >60)
GFR calc non Af Amer: >60 mL/min (ref >60)
Glucose, Bld: 114 mg/dL — ABNORMAL HIGH (ref 70–99)
Potassium: 4.1 mmol/L (ref 3.5–5.1)
Sodium: 136 mmol/L (ref 135–145)

## 2019-07-17 LAB — URINALYSIS, ROUTINE W REFLEX MICROSCOPIC
Bilirubin Urine: NEGATIVE
Glucose, UA: NEGATIVE mg/dL
Hgb urine dipstick: NEGATIVE
Ketones, ur: NEGATIVE mg/dL
Nitrite: NEGATIVE
Protein, ur: NEGATIVE mg/dL
Specific Gravity, Urine: 1.006 (ref 1.005–1.030)
pH: 8 (ref 5.0–8.0)

## 2019-07-17 LAB — CBC
HCT: 40.2 % (ref 36.0–46.0)
Hemoglobin: 13.2 g/dL (ref 12.0–15.0)
MCH: 29.7 pg (ref 26.0–34.0)
MCHC: 32.8 g/dL (ref 30.0–36.0)
MCV: 90.3 fL (ref 80.0–100.0)
Platelets: 243 10*3/uL (ref 150–400)
RBC: 4.45 MIL/uL (ref 3.87–5.11)
RDW: 12.9 % (ref 11.5–15.5)
WBC: 7.3 10*3/uL (ref 4.0–10.5)
nRBC: 0 % (ref 0.0–0.2)

## 2019-07-17 LAB — CBG MONITORING, ED: Glucose-Capillary: 98 mg/dL (ref 70–99)

## 2019-07-17 MED ORDER — CEPHALEXIN 500 MG PO CAPS: 500.0000 mg | ORAL_CAPSULE | Freq: Four times a day (QID) | ORAL | 0 refills | Status: DC

## 2019-07-17 MED ORDER — SODIUM CHLORIDE 0.9% FLUSH: 3.0000 mL | Freq: Once | INTRAVENOUS | Status: DC

## 2019-07-17 NOTE — ED Provider Notes (Signed)
[place] EMERGENCY DEPARTMENT Provider Note   CSN: [id number] Arrival date & time: 07/17/19  1024     History   Chief Complaint Chief Complaint  Patient presents with  . Gait Problem    HPI Jackie Stevens is a 73 y.o. female.  Presents emerge department with chief complaint of dizziness and feeling off balance.  Patient states yesterday morning she felt somewhat off balance and is having a difficult time walking.  Denies any falls, denies trauma.  States she primarily feels lightheaded and not like room spinning sensation.  Denies headache but does say that she has a tingling sensation throughout her head and it feels a little funny.  No associated vision changes, numbness, weakness, bladder or bowel incontinence, syncopal episodes.  At time of my interview, patient states she has no ongoing symptoms and feels at her baseline.  When completing ambulation trial in room patient reports feels at her baseline.   Lives at home with husband.     HPI  Past Medical History:  Diagnosis Date  . Abnormal cervical cytology 10/25/2012   Follows with Dr Leavy Cella of Gyn  . Allergic rhinitis   . Anemia 10/06/2013  . Anginal pain ([place])    pt has history of CP states had cardiac workup with no specific issues identified   . Anxiety    when increased stress   . Arthritis    "knees; right thumb; shoulders" (2013-01-17)  . Asthma   . Chest pain   . Chicken pox as a child  . Complication of anesthesia   . Constipation   . Decreased hearing   . Dermatitis 03/06/2017  . Diverticulitis 10/25/2012   pt. reports that a drain was placed - 09/2012    . Dry mouth   . Excessive thirst   . External hemorrhoid, bleeding    "sometimes" (01/17/2013)  . Frequent urination   . GERD (gastroesophageal reflux disease)   . H/O hiatal hernia   . Hand tingling   . Heart murmur   . HTN (hypertension)    stress test completed by Anselm Lis, diagnosed as GERD  . Hyperlipidemia    . Hypothyroidism   . Incontinence   . Infertility, female   . Insomnia   . Joint pain   . Kidney stones 1970's   "passed on their own" (01/17/13)  . Knee pain   . Left shoulder pain 09/14/2016  . Low back pain 06/09/2014  . Measles as a child  . Medicare annual wellness visit, subsequent 10/06/2013   Steinhoffer of Dermatology Pneumovax in 2012   . Memory difficulties 09/05/2017  . Obesity 09/11/2017  . OSA on CPAP   . Palpitations   . Paresthesia 03/23/2015   Left face  . PONV (postoperative nausea and vomiting)   . Preventative health care 10/06/2013   Steinhoffer of Dermatology Pneumovax in 2012  . Preventative health care 02/26/2016  . Rheumatoid arteritis ([place])   . Shortness of breath dyspnea    using stairs  . Sinus pain   . Sleep apnea   . Swelling of both lower extremities   . Thyroid cancer ([place]) 1980's  . Thyroid disease   . Tinnitus   . UTI (urinary tract infection) 04/02/2013    Patient Active Problem List   Diagnosis Date Noted  . Palpitations 05/28/2019  . Other fatigue 02/28/2018  . Shortness of breath on exertion 02/28/2018  . Vitamin D deficiency 02/28/2018  . Obesity 09/11/2017  . Memory difficulties  09/05/2017  . Dermatitis 03/06/2017  . Arthritis of left shoulder region 09/30/2016  . Pain of joint of left ankle and foot 09/15/2016  . Left shoulder pain 09/14/2016  . Preventative health care 02/26/2016  . Radicular pain of sacrum 02/26/2016  . Vitamin B12 deficiency 02/26/2016  . Ventral hernia 12/02/2015  . Pelvic mass in female 12/02/2015  . Colitis 09/14/2015  . Paresthesia 03/23/2015  . Otitis, externa, infective 03/13/2015  . Hyponatremia 03/13/2015  . Cystitis 09/10/2014  . Headache(784.0) 08/21/2014  . Low back pain 06/09/2014  . Anemia 10/06/2013  . Medicare annual wellness visit, subsequent 10/06/2013  . UTI (urinary tract infection) 04/02/2013  . Asthma, mild intermittent 04/02/2013  . Internal hemorrhoid, bleeding 01/23/2013  .  Diverticulitis of rectosigmoid 11/28/2012  . Colonic diverticular abscess 11/21/2012  . Abnormal cervical cytology 10/25/2012  . Cancer ([place])   . Insomnia   . Hypothyroidism   . Constipation   . Cough 09/02/2012  . Chest pain 08/05/2011  . Hyperlipidemia, mixed 11/08/2010  . Essential hypertension 11/08/2010  . G E R D 11/08/2010  . Obstructive sleep apnea 11/04/2010  . Allergic rhinitis 11/04/2010    Past Surgical History:  Procedure Laterality Date  . CHOLECYSTECTOMY  1990  . COLON SURGERY    . COLOSTOMY REVISION  12/19/2012   Procedure: COLON RESECTION SIGMOID;  Surgeon: Gwenyth Ober, MD;  Location: [place];  Service: General;  Laterality: N/A;  . CYSTOSCOPY WITH STENT PLACEMENT  12/19/2012   Procedure: CYSTOSCOPY WITH STENT PLACEMENT;  Surgeon: Hanley Ben, MD;  Location: [place];  Service: Urology;  Laterality: N/A;  . DILATION AND CURETTAGE OF UTERUS  1960's   "lots of them; had miscarriages" (12/19/2012)  . LYSIS OF ADHESION N/A 12/02/2015   Procedure: LAPAROSCOPIC LYSIS OF ADHESION;  Surgeon: Johnathan Hausen, MD;  Location: [facility];  Service: General;  Laterality: N/A;  . ROBOTIC ASSISTED BILATERAL SALPINGO OOPHERECTOMY Bilateral 12/02/2015   Procedure: XI ROBOTIC ASSISTED BILATERAL SALPINGO OOPHORECTOMY;  Surgeon: Everitt Amber, MD;  Location: [facility];  Service: Gynecology;  Laterality: Bilateral;  . SIGMOID RESECTION / RECTOPEXY  12/19/2012  . THYROIDECTOMY, PARTIAL  1988   "then did iodine to remove the rest" (12/19/2012)  . TONSILLECTOMY  1951?  . TRANSRECTAL DRAINAGE OF PELVIC ABSCESS  10/27/2012  . VAGINAL HYSTERECTOMY  1970's   "still have my ovaries" (12/19/2012)     OB History    Gravida  4   Para  2   Term  2   Preterm      AB  2   Living        SAB      TAB      Ectopic      Multiple      Live Births               Home Medications    Prior to Admission medications   Medication Sig Start Date End Date Taking? Authorizing Provider  azelastine  (ASTELIN) 0.1 % nasal spray Place 2 sprays into both nostrils at bedtime as needed for rhinitis. Use in each nostril as directed 01/08/19   Colon Branch, MD  benzonatate (TESSALON) 100 MG capsule Take 1 capsule (100 mg total) by mouth 3 (three) times daily as needed for cough. 02/05/19   Saguier, Percell Miller, PA-C  docusate sodium (COLACE) 100 MG capsule Take 100 mg by mouth 2 (two) times daily. Reported on 03/30/2016    [provider]  famotidine (PEPCID) 40 MG  tablet Take 1 tablet (40 mg total) by mouth daily. 04/25/19   Mosie Lukes, MD  fluticasone (FLONASE) 50 MCG/ACT nasal spray Place 2 sprays into both nostrils daily. 03/29/17   Mosie Lukes, MD  hydrochlorothiazide (HYDRODIURIL) 25 MG tablet TAKE 1 TABLET BY MOUTH EVERY DAY Patient taking differently: Take 25 mg by mouth daily.  01/04/19   Mosie Lukes, MD  hyoscyamine (LEVSIN SL) 0.125 MG SL tablet Place 1 tablet (0.125 mg total) under the tongue every 4 (four) hours as needed. 11/07/18   Mosie Lukes, MD  KETOCONAZOLE, TOPICAL, 1 % SHAM Apply 1 Dose topically once a week. 03/03/18   Mosie Lukes, MD  levothyroxine (SYNTHROID, LEVOTHROID) 150 MCG tablet Take 150 mcg by mouth daily before breakfast.    [provider]  losartan (COZAAR) 100 MG tablet TAKE 1 TABLET BY MOUTH EVERY DAY Patient taking differently: Take 100 mg by mouth daily.  01/04/19   Mosie Lukes, MD  metoprolol tartrate (LOPRESSOR) 25 MG tablet TAKE 2&1/2 TABLETS (62.5 MG TOTAL) BY MOUTH 2 (TWO) TIMES DAILY. 06/14/19   Mosie Lukes, MD  Multiple Vitamins-Minerals (MULTIVITAMIN WOMEN 50+ PO) Take 1 tablet by mouth daily.    [provider]  naproxen (NAPROSYN) 375 MG tablet TAKE 1 TABLET (375 MG TOTAL) BY MOUTH 2 (TWO) TIMES DAILY WITH A MEAL. 07/09/19   Mosie Lukes, MD  nitroGLYCERIN (NITROSTAT) 0.4 MG SL tablet PLACE 1 TABLET UNDER THE TONGUE EVERY 5 MINUTES AS NEEDED FOR CHEST PAIN 11/07/18   Mosie Lukes, MD  omeprazole (PRILOSEC) 40  MG capsule TAKE 1 CAPSULE BY MOUTH TWICE A DAY 06/27/19   Mosie Lukes, MD  simvastatin (ZOCOR) 40 MG tablet TAKE 1 TABLET BY MOUTH EVERYDAY AT BEDTIME 05/16/19   Mosie Lukes, MD  TURMERIC PO Take 1 tablet by mouth daily. Reported on 12/19/2015    [provider]  Vitamin D, Cholecalciferol, 1000 UNITS CAPS Take 1 capsule by mouth daily.    [provider]    Family History Family History  Problem Relation Age of Onset  . Heart disease Father   . Pneumonia Father   . Hypertension Father   . Hyperlipidemia Father   . Cancer Father        skin  . Stroke Father   . Alzheimer's disease Mother   . Heart disease Mother   . Depression Mother   . Emphysema Brother        marijuana and cigarettes  . Alcohol abuse Brother   . Hearing loss Brother   . Diabetes Maternal Grandmother   . Alzheimer's disease Paternal Grandmother   . Cancer Paternal Grandmother        lung?- smoker  . Hyperlipidemia Paternal Grandmother   . Heart attack Paternal Grandfather   . Alcohol abuse Paternal Grandfather   . Neurofibromatosis Son        schwanomatosis  . Neurofibromatosis Son        swanomatosis  . Cancer Paternal Aunt     Social History Social History   Tobacco Use  . Smoking status: Never Smoker  . Smokeless tobacco: Never Used  Substance Use Topics  . Alcohol use: No    Alcohol/week: 0.0 standard drinks  . Drug use: No     Allergies   Neomycin-bacitracin zn-polymyx, Niacin, Ciprofloxacin, and Flagyl [metronidazole]   Review of Systems Review of Systems  Constitutional: Negative for chills and fever.  HENT: Negative for ear pain  and sore throat.   Eyes: Negative for pain and visual disturbance.  Respiratory: Negative for cough and shortness of breath.   Cardiovascular: Negative for chest pain and palpitations.  Gastrointestinal: Negative for abdominal pain and vomiting.  Genitourinary: Negative for dysuria and hematuria.  Musculoskeletal: Negative for  arthralgias and back pain.  Skin: Negative for color change and rash.  Neurological: Positive for dizziness and light-headedness. Negative for seizures and syncope.  All other systems reviewed and are negative.    Physical Exam Updated Vital Signs BP (!) 197/78 (BP Location: Right Arm)   Pulse (!) 57   Temp 98.1 F (36.7 C) (Oral)   Resp 16   SpO2 98%   Physical Exam Vitals signs and nursing note reviewed.  Constitutional:      General: She is not in acute distress.    Appearance: She is well-developed.  HENT:     Head: Normocephalic and atraumatic.  Eyes:     Conjunctiva/sclera: Conjunctivae normal.  Neck:     Musculoskeletal: Neck supple.  Cardiovascular:     Rate and Rhythm: Normal rate and regular rhythm.     Heart sounds: No murmur.  Pulmonary:     Effort: Pulmonary effort is normal. No respiratory distress.     Breath sounds: Normal breath sounds.  Abdominal:     Palpations: Abdomen is soft.     Tenderness: There is no abdominal tenderness.  Skin:    General: Skin is warm and dry.  Neurological:     Mental Status: She is alert.     Cranial Nerves: Cranial nerves are intact.     Sensory: Sensation is intact.     Comments: 5 out of 5 strength in upper and lower extremities, normal finger-nose-finger, normal heel-to-shin, normal gait, unassisted      ED Treatments / Results  Labs (all labs ordered are listed, but only abnormal results are displayed) Labs Reviewed  BASIC METABOLIC PANEL - Abnormal; Notable for the following components:      Result Value   Glucose, Bld 114 (*)    Calcium 8.8 (*)    All other components within normal limits  CBC  URINALYSIS, ROUTINE W REFLEX MICROSCOPIC  CBG MONITORING, ED    EKG None  Radiology No results found.  Procedures Procedures (including critical care time)  Medications Ordered in ED Medications  sodium chloride flush (NS) 0.9 % injection 3 mL (has no administration in time range)     Initial  Impression / Assessment and Plan / ED Course  I have reviewed the triage vital signs and the nursing notes.  Pertinent labs & imaging results that were available during my care of the patient were reviewed by me and considered in my medical decision making (see chart for details).  Clinical Course as of Jul 17 1339  Tue Jul 17, 2019  1327 Completed initial assessment, patient did well on ambulation trial in room   [RD]    Clinical Course User Index [RD] Jackie Starch, MD       73 year old lady who presented to the emergency department after having episode of feeling unsteady and lightheaded.  Here patient was noted to be well-appearing, she had no ongoing neurologic complaints, no additional neurologic symptoms, had a nonfocal neurologic exam.  CT head was negative.  UA with possible UTI.  Labs otherwise unremarkable.  Patient had reported similar episode from 2016 which included MRI imaging which was negative, thought to be TIA at the time.  Given patient has  had complete resolution of symptoms and remains well-appearing after period of observation while in department, believe appropriate for discharge home and outpatient management.  Will give Rx for treatment of UTI.  Reviewed strict return precautions, requested close PCP follow-up for recheck.    After the discussed management above, the patient was determined to be safe for discharge.  The patient was in agreement with this plan and all questions regarding their care were answered.  ED return precautions were discussed and the patient will return to the ED with any significant worsening of condition.    Final Clinical Impressions(s) / ED Diagnoses   Final diagnoses:  Urinary tract infection without hematuria, site unspecified  Gait disturbance    ED Discharge Orders    None       Jackie Starch, MD 07/17/19 1753

## 2019-07-17 NOTE — Telephone Encounter (Signed)
Patient currently in the ED.

## 2019-07-17 NOTE — ED Notes (Signed)
Pt ambulated with STEADY gait. No assistance needed when walking, standing, or sitting. Pt was using roller walker to help her ambulate.

## 2019-07-17 NOTE — Telephone Encounter (Signed)
Call this patient and see if she wants to have a virtual visit or come in. Find out if she has any worsening symptoms and what meds she has tried

## 2019-07-17 NOTE — Telephone Encounter (Signed)
Pt's husband called in but pt there with him.   She woke up dizzy this morning.   She is so dizzy she has to hold onto things to ambulate around the house.    She has had this before and seen Dr. Randel Pigg for it.   She also has headaches with it sometimes but not this morning.   Dr. Randel Pigg had her see a heart doctor and he checked her heart and everything was fine.   Dr. Randel Pigg doesn't know what is causing this when I asked her what Dr.Blythe told her in the past.   She is having some nausea but no other symptoms with the dizziness.    Due to her having these symptoms before I did not send her to the ED but will follow the protocol for PCP triage.    I let her and husband know I will send a high priority note to the office so when they open at 8:00 AM I will have someone call them.   I instructed them to go to the ED if any s/s of stroke occurred and I went over those with them.    They verbalized understanding of these instructions and were agreeable to having someone call them back since she has been seen in the ED before with these   same  Symptoms.  I I sent a high priority note to Dr. Azalee Course office.   Reason for Disposition . [1] Dizziness (vertigo) present now AND [2] age > 60  (Exception: prior physician evaluation for this AND no different/worse than usual)  Answer Assessment - Initial Assessment Questions 1. DESCRIPTION: "Describe your dizziness."     Woke up dizzy.  Husband called in.   She is nauseas.   Her head "feels funny"    This happened yesterday too.   It went away on it's own.  She has to hold onto things to walk.    2. VERTIGO: "Do you feel like either you or the room is spinning or tilting?"      I don't think so. 3. LIGHTHEADED: "Do you feel lightheaded?" (e.g., somewhat faint, woozy, weak upon standing)     No chest pain or shortness of breath. 4. SEVERITY: "How bad is it?"  "Can you walk?"   - MILD - Feels unsteady but walking normally.   - MODERATE - Feels very  unsteady when walking, but not falling; interferes with normal activities (e.g., school, work) .   - SEVERE - Unable to walk without falling (requires assistance).     Severe.     Both were tingling but not now. 5. ONSET:  "When did the dizziness begin?"     Yesterday.       She's had this problem before maybe 3-4 times a year.   She saw a heart doctor and everything was fine. 6. AGGRAVATING FACTORS: "Does anything make it worse?" (e.g., standing, change in head position)     Walking around 7. CAUSE: "What do you think is causing the dizziness?"     I don't know. 8. RECURRENT SYMPTOM: "Have you had dizziness before?" If so, ask: "When was the last time?" "What happened that time?"     Yes 9. OTHER SYMPTOMS: "Do you have any other symptoms?" (e.g., headache, weakness, numbness, vomiting, earache)     She's had headaches before with this and felt funny.   She has talked with Dr. Randel Pigg about this before. 10. PREGNANCY: "Is there any chance you are pregnant?" "When was  your last menstrual period?"       N/A  Protocols used: DIZZINESS - VERTIGO-A-AH

## 2019-07-17 NOTE — ED Triage Notes (Signed)
Pt states yesterday morning when she got out of bed she noticed she was "off balance" and having trouble walking pt denies any falls. Pt states today when she got off of bed the gait disturbance was worse and was having to hold onto the walls. Pt denies a headache or vision change. Pt states "my head just feels funny".

## 2019-07-17 NOTE — Telephone Encounter (Signed)
good

## 2019-07-17 NOTE — Discharge Instructions (Signed)
Please take the antibiotic as prescribed for the possible urinary tract infection.  Please call your primary doctor to schedule a close recheck later this week.  If you develop recurrent symptoms, difficulty walking, numbness, weakness or other new concerning symptom, recommend return to the ER for reassessment.

## 2019-07-18 ENCOUNTER — Other Ambulatory Visit: Payer: Self-pay | Admitting: Family Medicine

## 2019-07-20 ENCOUNTER — Other Ambulatory Visit: Payer: Self-pay | Admitting: Family Medicine

## 2019-07-23 ENCOUNTER — Ambulatory Visit (INDEPENDENT_AMBULATORY_CARE_PROVIDER_SITE_OTHER): Payer: PPO | Admitting: Family Medicine

## 2019-07-23 ENCOUNTER — Other Ambulatory Visit: Payer: Self-pay

## 2019-07-23 DIAGNOSIS — E669 Obesity, unspecified: Secondary | ICD-10-CM

## 2019-07-23 DIAGNOSIS — E538 Deficiency of other specified B group vitamins: Secondary | ICD-10-CM | POA: Diagnosis not present

## 2019-07-23 DIAGNOSIS — R42 Dizziness and giddiness: Secondary | ICD-10-CM | POA: Diagnosis not present

## 2019-07-23 DIAGNOSIS — R300 Dysuria: Secondary | ICD-10-CM

## 2019-07-23 DIAGNOSIS — Z6831 Body mass index (BMI) 31.0-31.9, adult: Secondary | ICD-10-CM

## 2019-07-23 DIAGNOSIS — I10 Essential (primary) hypertension: Secondary | ICD-10-CM | POA: Diagnosis not present

## 2019-07-23 DIAGNOSIS — N3000 Acute cystitis without hematuria: Secondary | ICD-10-CM

## 2019-07-23 DIAGNOSIS — E559 Vitamin D deficiency, unspecified: Secondary | ICD-10-CM

## 2019-07-23 DIAGNOSIS — L299 Pruritus, unspecified: Secondary | ICD-10-CM

## 2019-07-23 DIAGNOSIS — J309 Allergic rhinitis, unspecified: Secondary | ICD-10-CM | POA: Diagnosis not present

## 2019-07-23 DIAGNOSIS — E782 Mixed hyperlipidemia: Secondary | ICD-10-CM | POA: Diagnosis not present

## 2019-07-23 MED ORDER — CEPHALEXIN 500 MG PO CAPS: 500.0000 mg | ORAL_CAPSULE | Freq: Four times a day (QID) | ORAL | 0 refills | Status: AC

## 2019-07-23 MED ORDER — METOPROLOL TARTRATE 25 MG PO TABS: 75.0000 mg | ORAL_TABLET | Freq: Two times a day (BID) | ORAL | 1 refills | Status: DC

## 2019-07-25 NOTE — Assessment & Plan Note (Signed)
Continue current meds with increased sinus pressure and dizziness will refer to ENT for further evaluation

## 2019-07-25 NOTE — Assessment & Plan Note (Signed)
Supplement and monitor 

## 2019-07-25 NOTE — Progress Notes (Signed)
Virtual Visit via phone Note  I connected with [name] on 07/23/19 at  1:40 PM EDT by a phone enabled telemedicine application and verified that I am speaking with the correct person using two identifiers. Magdalene Molly, CMA was able to get patient set up on visit, phone after being unable to set up on a video visit.  Location: Patient: home Provider: office   I discussed the limitations of evaluation and management by telemedicine and the availability of in person appointments. The patient expressed understanding and agreed to proceed.    Subjective:    Patient ID: Jackie Stevens, female    DOB: 1946/11/23, 73 y.o.   MRN: [id number]  No chief complaint on file.   HPI Patient is in today for follow up after being in the emergency room. She presented with dizziness, nausea and vomiting and was diagnosed with a urinary tract infection. She was placed on Keflex and now she is some better. She still struggling with some nausea but not vomiting. The dizziness is improved but not gone. No fevers or chills. No falls. She does endorse pressure in her face, especially over forehead. Does endorse some ear pain. No discharge.   Past Medical History:  Diagnosis Date  . Abnormal cervical cytology 10/25/2012   Follows with Dr Leavy Cella of Gyn  . Allergic rhinitis   . Anemia 10/06/2013  . Anginal pain ([place])    pt has history of CP states had cardiac workup with no specific issues identified   . Anxiety    when increased stress   . Arthritis    "knees; right thumb; shoulders" (01-04-13)  . Asthma   . Chest pain   . Chicken pox as a child  . Complication of anesthesia   . Constipation   . Decreased hearing   . Dermatitis 03/06/2017  . Diverticulitis 10/25/2012   pt. reports that a drain was placed - 09/2012    . Dry mouth   . Excessive thirst   . External hemorrhoid, bleeding    "sometimes" (2013-01-04)  . Frequent urination   . GERD (gastroesophageal reflux disease)   . H/O  hiatal hernia   . Hand tingling   . Heart murmur   . HTN (hypertension)    stress test completed by Anselm Lis, diagnosed as GERD  . Hyperlipidemia   . Hypothyroidism   . Incontinence   . Infertility, female   . Insomnia   . Joint pain   . Kidney stones 1970's   "passed on their own" (Jan 04, 2013)  . Knee pain   . Left shoulder pain 09/14/2016  . Low back pain 06/09/2014  . Measles as a child  . Medicare annual wellness visit, subsequent 10/06/2013   [facility] D[facility]at[facility] Pneumovax in 2012   . Memory difficulties 09/05/2017  . Obesity 09/11/2017  . OSA on CPAP   . Palpitations   . Paresthesia 03/23/2015   Left face  . PONV (postoperative nausea and vomiting)   . Preventative health care 10/06/2013   [facility] Pneumovax in 2012  . Preventative health care 02/26/2016  . Rheumatoid arteritis ([place])   . Shortness of breath dyspnea    using stairs  . Sinus pain   . Sleep apnea   . Swelling of both lower extremities   . Thyroid cancer ([place]) 1980's  . Thyroid disease   . Tinnitus   . UTI (urinary tract infection) 04/02/2013    Past Surgical History:  Procedure Laterality Date  .  CHOLECYSTECTOMY  1990  . COLON SURGERY    . COLOSTOMY REVISION  12/19/2012   Procedure: COLON RESECTION SIGMOID;  Surgeon: Gwenyth Ober, MD;  Location: [place];  Service: General;  Laterality: N/A;  . CYSTOSCOPY WITH STENT PLACEMENT  12/19/2012   Procedure: CYSTOSCOPY WITH STENT PLACEMENT;  Surgeon: Hanley Ben, MD;  Location: [place];  Service: Urology;  Laterality: N/A;  . DILATION AND CURETTAGE OF UTERUS  1960's   "lots of them; had miscarriages" (12/19/2012)  . LYSIS OF ADHESION N/A 12/02/2015   Procedure: LAPAROSCOPIC LYSIS OF ADHESION;  Surgeon: Johnathan Hausen, MD;  Location: [facility];  Service: General;  Laterality: N/A;  . ROBOTIC ASSISTED BILATERAL SALPINGO OOPHERECTOMY Bilateral 12/02/2015   Procedure: XI ROBOTIC ASSISTED BILATERAL SALPINGO OOPHORECTOMY;  Surgeon: Everitt Amber,  MD;  Location: [facility];  Service: Gynecology;  Laterality: Bilateral;  . SIGMOID RESECTION / RECTOPEXY  12/19/2012  . THYROIDECTOMY, PARTIAL  1988   "then did iodine to remove the rest" (12/19/2012)  . TONSILLECTOMY  1951?  . TRANSRECTAL DRAINAGE OF PELVIC ABSCESS  10/27/2012  . VAGINAL HYSTERECTOMY  1970's   "still have my ovaries" (12/19/2012)    Family History  Problem Relation Age of Onset  . Heart disease Father   . Pneumonia Father   . Hypertension Father   . Hyperlipidemia Father   . Cancer Father        skin  . Stroke Father   . Alzheimer's disease Mother   . Heart disease Mother   . Depression Mother   . Emphysema Brother        marijuana and cigarettes  . Alcohol abuse Brother   . Hearing loss Brother   . Diabetes Maternal Grandmother   . Alzheimer's disease Paternal Grandmother   . Cancer Paternal Grandmother        lung?- smoker  . Hyperlipidemia Paternal Grandmother   . Heart attack Paternal Grandfather   . Alcohol abuse Paternal Grandfather   . Neurofibromatosis Son        schwanomatosis  . Neurofibromatosis Son        swanomatosis  . Cancer Paternal Aunt     Social History   Socioeconomic History  . Marital status: Married    Spouse name: [name]  . Number of children: 2  . Years of education: Not on file  . Highest education level: Not on file  Occupational History  . Occupation: Retired  Scientific laboratory technician  . Financial resource strain: Not on file  . Food insecurity    Worry: Not on file    Inability: Not on file  . Transportation needs    Medical: Not on file    Non-medical: Not on file  Tobacco Use  . Smoking status: Never Smoker  . Smokeless tobacco: Never Used  Substance and Sexual Activity  . Alcohol use: No    Alcohol/week: 0.0 standard drinks  . Drug use: No  . Sexual activity: Not Currently  Lifestyle  . Physical activity    Days per week: Not on file    Minutes per session: Not on file  . Stress: Not on file  Relationships  . Social  Herbalist on phone: Not on file    Gets together: Not on file    Attends religious service: Not on file    Active member of club or organization: Not on file    Attends meetings of clubs or organizations: Not on file    Relationship status: Not on  file  . Intimate partner violence    Fear of current or ex partner: Not on file    Emotionally abused: Not on file    Physically abused: Not on file    Forced sexual activity: Not on file  Other Topics Concern  . Not on file  Social History Narrative   Married    children    Outpatient Medications Prior to Visit  Medication Sig Dispense Refill  . azelastine (ASTELIN) 0.1 % nasal spray Place 2 sprays into both nostrils at bedtime as needed for rhinitis. Use in each nostril as directed 30 mL 3  . benzonatate (TESSALON) 100 MG capsule Take 1 capsule (100 mg total) by mouth 3 (three) times daily as needed for cough. 30 capsule 0  . docusate sodium (COLACE) 100 MG capsule Take 100 mg by mouth 2 (two) times daily. Reported on 03/30/2016    . famotidine (PEPCID) 40 MG tablet Take 1 tablet (40 mg total) by mouth daily. 90 tablet 1  . fluticasone (FLONASE) 50 MCG/ACT nasal spray Place 2 sprays into both nostrils daily. 48 g 3  . hydrochlorothiazide (HYDRODIURIL) 25 MG tablet TAKE 1 TABLET BY MOUTH EVERY DAY 90 tablet 1  . hyoscyamine (LEVSIN SL) 0.125 MG SL tablet Place 1 tablet (0.125 mg total) under the tongue every 4 (four) hours as needed. 30 tablet 1  . KETOCONAZOLE, TOPICAL, 1 % SHAM Apply 1 Dose topically once a week. 200 mL 1  . levothyroxine (SYNTHROID, LEVOTHROID) 150 MCG tablet Take 150 mcg by mouth daily before breakfast.    . losartan (COZAAR) 100 MG tablet TAKE 1 TABLET BY MOUTH EVERY DAY 90 tablet 1  . Multiple Vitamins-Minerals (MULTIVITAMIN WOMEN 50+ PO) Take 1 tablet by mouth daily.    . naproxen (NAPROSYN) 375 MG tablet TAKE 1 TABLET (375 MG TOTAL) BY MOUTH 2 (TWO) TIMES DAILY WITH A MEAL. 180 tablet 1  . nitroGLYCERIN  (NITROSTAT) 0.4 MG SL tablet PLACE 1 TABLET UNDER THE TONGUE EVERY 5 MINUTES AS NEEDED FOR CHEST PAIN 25 tablet 1  . omeprazole (PRILOSEC) 40 MG capsule TAKE 1 CAPSULE BY MOUTH TWICE A DAY 180 capsule 1  . simvastatin (ZOCOR) 40 MG tablet TAKE 1 TABLET BY MOUTH EVERYDAY AT BEDTIME 90 tablet 1  . TURMERIC PO Take 1 tablet by mouth daily. Reported on 12/19/2015    . Vitamin D, Cholecalciferol, 1000 UNITS CAPS Take 1 capsule by mouth daily.    . cephALEXin (KEFLEX) 500 MG capsule Take 1 capsule (500 mg total) by mouth 4 (four) times daily for 7 days. 28 capsule 0  . metoprolol tartrate (LOPRESSOR) 25 MG tablet TAKE 2&1/2 TABLETS (62.5 MG TOTAL) BY MOUTH 2 (TWO) TIMES DAILY. 60 tablet 2   No facility-administered medications prior to visit.     Allergies  Allergen Reactions  . Neomycin-Bacitracin Zn-Polymyx Rash    Polysporin- is tolerated   . Niacin Other (See Comments) and Cough    "cough til I threw up" (12/19/2012)  . Ciprofloxacin Hives    Got cipro and flagyl at same time, localized hives to IV arm  . Flagyl [Metronidazole] Hives    Got cipro and flagyl at same time, localized hives to IV arm    Review of Systems  Constitutional: Positive for malaise/fatigue. Negative for fever.  HENT: Positive for congestion and ear pain. Negative for nosebleeds and tinnitus.   Eyes: Negative for blurred vision.  Respiratory: Negative for shortness of breath.   Cardiovascular: Negative for chest pain,  palpitations and leg swelling.  Gastrointestinal: Positive for abdominal pain. Negative for blood in stool and nausea.  Genitourinary: Negative for dysuria and frequency.  Musculoskeletal: Positive for myalgias. Negative for falls.  Skin: Negative for rash.  Neurological: Positive for dizziness. Negative for loss of consciousness and headaches.  Endo/Heme/Allergies: Negative for environmental allergies.  Psychiatric/Behavioral: Negative for depression. The patient is not nervous/anxious.         Objective:    Physical Exam unable to obtain via phone  There were no vitals taken for this visit. Wt Readings from Last 3 Encounters:  04/29/19 180 lb (81.6 kg)  02/05/19 168 lb 6.4 oz (76.4 kg)  01/08/19 170 lb 4 oz (77.2 kg)    Diabetic Foot Exam - Simple   No data filed     Lab Results  Component Value Date   WBC 7.3 07/17/2019   HGB 13.2 07/17/2019   HCT 40.2 07/17/2019   PLT 243 07/17/2019   GLUCOSE 114 (H) 07/17/2019   CHOL 154 11/07/2018   TRIG 78.0 11/07/2018   HDL 57.70 11/07/2018   LDLCALC 81 11/07/2018   ALT 17 11/07/2018   AST 19 11/07/2018   NA 136 07/17/2019   K 4.1 07/17/2019   CL 100 07/17/2019   CREATININE 0.64 07/17/2019   BUN 16 07/17/2019   CO2 24 07/17/2019   TSH 0.88 11/07/2018   INR 0.99 03/16/2015   HGBA1C 5.6 11/07/2018    Lab Results  Component Value Date   TSH 0.88 11/07/2018   Lab Results  Component Value Date   WBC 7.3 07/17/2019   HGB 13.2 07/17/2019   HCT 40.2 07/17/2019   MCV 90.3 07/17/2019   PLT 243 07/17/2019   Lab Results  Component Value Date   NA 136 07/17/2019   K 4.1 07/17/2019   CO2 24 07/17/2019   GLUCOSE 114 (H) 07/17/2019   BUN 16 07/17/2019   CREATININE 0.64 07/17/2019   BILITOT 0.3 11/07/2018   ALKPHOS 81 11/07/2018   AST 19 11/07/2018   ALT 17 11/07/2018   PROT 7.1 11/07/2018   ALBUMIN 4.6 11/07/2018   CALCIUM 8.8 (L) 07/17/2019   ANIONGAP 12 07/17/2019   GFR 92.99 11/07/2018   Lab Results  Component Value Date   CHOL 154 11/07/2018   Lab Results  Component Value Date   HDL 57.70 11/07/2018   Lab Results  Component Value Date   LDLCALC 81 11/07/2018   Lab Results  Component Value Date   TRIG 78.0 11/07/2018   Lab Results  Component Value Date   CHOLHDL 3 11/07/2018   Lab Results  Component Value Date   HGBA1C 5.6 11/07/2018       Assessment & Plan:   Problem List Items Addressed This Visit    Hyperlipidemia, mixed   Relevant Medications   metoprolol tartrate (LOPRESSOR)  25 MG tablet   Other Relevant Orders   Lipid panel   Essential hypertension - Primary    Check vitals weekly. no changes to meds. Encouraged heart healthy diet such as the DASH diet and exercise as tolerated.       Relevant Medications   metoprolol tartrate (LOPRESSOR) 25 MG tablet   Other Relevant Orders   CBC   Comprehensive metabolic panel   TSH   Allergic rhinitis    Continue current meds with increased sinus pressure and dizziness will refer to ENT for further evaluation      UTI (urinary tract infection)    Tolerating Keflex and feeling some  better.       Relevant Medications   cephALEXin (KEFLEX) 500 MG capsule   Vitamin B12 deficiency    Monitor and supplement      Obesity   Relevant Orders   Hemoglobin A1c   Vitamin D deficiency    Supplement and monitor       Other Visit Diagnoses    Pruritic dermatitis       Relevant Orders   Ambulatory referral to ENT   Dizziness       Relevant Orders   Ambulatory referral to ENT   Dysuria       Relevant Orders   Urine Culture   Urinalysis      I have changed Mariavictoria P. Twichell's metoprolol tartrate. I am also having her maintain her TURMERIC PO, Vitamin D (Cholecalciferol), docusate sodium, fluticasone, Multiple Vitamins-Minerals (MULTIVITAMIN WOMEN 50+ PO), KETOCONAZOLE (TOPICAL), levothyroxine, nitroGLYCERIN, hyoscyamine, azelastine, benzonatate, famotidine, simvastatin, omeprazole, naproxen, losartan, hydrochlorothiazide, and cephALEXin.  Meds ordered this encounter  Medications  . cephALEXin (KEFLEX) 500 MG capsule    Sig: Take 1 capsule (500 mg total) by mouth 4 (four) times daily for 7 days.    Dispense:  20 capsule    Refill:  0  . metoprolol tartrate (LOPRESSOR) 25 MG tablet    Sig: Take 3 tablets (75 mg total) by mouth 2 (two) times daily.    Dispense:  180 tablet    Refill:  1    PATIENT WOULD LIKE 30 DAY SUPPLY     I discussed the assessment and treatment plan with the patient. The patient was  provided an opportunity to ask questions and all were answered. The patient agreed with the plan and demonstrated an understanding of the instructions.   The patient was advised to call back or seek an in-person evaluation if the symptoms worsen or if the condition fails to improve as anticipated.  I provided 25 minutes of non-face-to-face time during this encounter.   Penni Homans, MD

## 2019-07-25 NOTE — Assessment & Plan Note (Signed)
Tolerating Keflex and feeling some better.

## 2019-07-25 NOTE — Assessment & Plan Note (Signed)
Check vitals weekly no changes to meds. Encouraged heart healthy diet such as the DASH diet and exercise as tolerated.  

## 2019-07-25 NOTE — Assessment & Plan Note (Signed)
Monitor and supplement 

## 2019-07-30 NOTE — Telephone Encounter (Signed)
This encounter was created in error - please disregard.

## 2019-08-02 ENCOUNTER — Encounter: Payer: Self-pay | Admitting: Family Medicine

## 2019-08-02 ENCOUNTER — Ambulatory Visit (HOSPITAL_BASED_OUTPATIENT_CLINIC_OR_DEPARTMENT_OTHER)
Admission: RE | Admit: 2019-08-02 | Discharge: 2019-08-02 | Disposition: A | Discharge status: Home / Self Care | Payer: PPO | Source: Ambulatory Visit | Attending: Family Medicine | Admitting: Family Medicine

## 2019-08-02 ENCOUNTER — Other Ambulatory Visit: Payer: Self-pay

## 2019-08-02 ENCOUNTER — Other Ambulatory Visit: Payer: Self-pay | Admitting: Family Medicine

## 2019-08-02 ENCOUNTER — Ambulatory Visit (INDEPENDENT_AMBULATORY_CARE_PROVIDER_SITE_OTHER): Payer: PPO | Admitting: Family Medicine

## 2019-08-02 VITALS — BP 155/55 | HR 57 | Temp 98.6°F | Resp 12 | Ht 62.0 in | Wt 187.0 lb

## 2019-08-02 DIAGNOSIS — I10 Essential (primary) hypertension: Secondary | ICD-10-CM | POA: Diagnosis not present

## 2019-08-02 DIAGNOSIS — R300 Dysuria: Secondary | ICD-10-CM | POA: Diagnosis not present

## 2019-08-02 DIAGNOSIS — E782 Mixed hyperlipidemia: Secondary | ICD-10-CM

## 2019-08-02 DIAGNOSIS — E669 Obesity, unspecified: Secondary | ICD-10-CM | POA: Diagnosis not present

## 2019-08-02 DIAGNOSIS — R109 Unspecified abdominal pain: Secondary | ICD-10-CM

## 2019-08-02 DIAGNOSIS — R42 Dizziness and giddiness: Secondary | ICD-10-CM | POA: Diagnosis not present

## 2019-08-02 DIAGNOSIS — E038 Other specified hypothyroidism: Secondary | ICD-10-CM

## 2019-08-02 DIAGNOSIS — R630 Anorexia: Secondary | ICD-10-CM

## 2019-08-02 DIAGNOSIS — R1084 Generalized abdominal pain: Secondary | ICD-10-CM

## 2019-08-02 DIAGNOSIS — Z6831 Body mass index (BMI) 31.0-31.9, adult: Secondary | ICD-10-CM | POA: Diagnosis not present

## 2019-08-02 DIAGNOSIS — E559 Vitamin D deficiency, unspecified: Secondary | ICD-10-CM | POA: Diagnosis not present

## 2019-08-02 DIAGNOSIS — E538 Deficiency of other specified B group vitamins: Secondary | ICD-10-CM

## 2019-08-02 DIAGNOSIS — Z0000 Encounter for general adult medical examination without abnormal findings: Secondary | ICD-10-CM

## 2019-08-02 DIAGNOSIS — R1032 Left lower quadrant pain: Secondary | ICD-10-CM | POA: Diagnosis not present

## 2019-08-02 DIAGNOSIS — Z0001 Encounter for general adult medical examination with abnormal findings: Secondary | ICD-10-CM | POA: Diagnosis not present

## 2019-08-02 MED ORDER — MECLIZINE HCL 25 MG PO TABS: 25.0000 mg | ORAL_TABLET | Freq: Three times a day (TID) | ORAL | 1 refills | Status: DC | PRN

## 2019-08-02 MED ORDER — TRIAMCINOLONE ACETONIDE 0.1 % EX CREA: 1.0000 "application " | TOPICAL_CREAM | Freq: Two times a day (BID) | CUTANEOUS | 1 refills | Status: DC

## 2019-08-02 NOTE — Patient Instructions (Addendum)
[employer] Blood Pressure Cuff, upper arm Pulse Oximeter  Check vitals weekly  Weight Watchers, APP     Preventive Care 65 Years and Older, Female Preventive care refers to lifestyle choices and visits with your health care provider that can promote health and wellness. This includes:  A yearly physical exam. This is also called an annual well check.  Regular dental and eye exams.  Immunizations.  Screening for certain conditions.  Healthy lifestyle choices, such as diet and exercise. What can I expect for my preventive care visit? Physical exam Your health care provider will check:  Height and weight. These may be used to calculate body mass index (BMI), which is a measurement that tells if you are at a healthy weight.  Heart rate and blood pressure.  Your skin for abnormal spots. Counseling Your health care provider may ask you questions about:  Alcohol, tobacco, and drug use.  Emotional well-being.  Home and relationship well-being.  Sexual activity.  Eating habits.  History of falls.  Memory and ability to understand (cognition).  Work and work Statistician.  Pregnancy and menstrual history. What immunizations do I need?  Influenza (flu) vaccine  This is recommended every year. Tetanus, diphtheria, and pertussis (Tdap) vaccine  You may need a Td booster every 10 years. Varicella (chickenpox) vaccine  You may need this vaccine if you have not already been vaccinated. Zoster (shingles) vaccine  You may need this after age 17. Pneumococcal conjugate (PCV13) vaccine  One dose is recommended after age 53. Pneumococcal polysaccharide (PPSV23) vaccine  One dose is recommended after age 11. Measles, mumps, and rubella (MMR) vaccine  You may need at least one dose of MMR if you were born in 1957 or later. You may also need a second dose. Meningococcal conjugate (MenACWY) vaccine  You may need this if you have certain conditions. Hepatitis A vaccine   You may need this if you have certain conditions or if you travel or work in places where you may be exposed to hepatitis A. Hepatitis B vaccine  You may need this if you have certain conditions or if you travel or work in places where you may be exposed to hepatitis B. Haemophilus influenzae type b (Hib) vaccine  You may need this if you have certain conditions. You may receive vaccines as individual doses or as more than one vaccine together in one shot (combination vaccines). Talk with your health care provider about the risks and benefits of combination vaccines. What tests do I need? Blood tests  Lipid and cholesterol levels. These may be checked every 5 years, or more frequently depending on your overall health.  Hepatitis C test.  Hepatitis B test. Screening  Lung cancer screening. You may have this screening every year starting at age 53 if you have a 30-pack-year history of smoking and currently smoke or have quit within the past 15 years.  Colorectal cancer screening. All adults should have this screening starting at age 39 and continuing until age 45. Your health care provider may recommend screening at age 28 if you are at increased risk. You will have tests every 1-10 years, depending on your results and the type of screening test.  Diabetes screening. This is done by checking your blood sugar (glucose) after you have not eaten for a while (fasting). You may have this done every 1-3 years.  Mammogram. This may be done every 1-2 years. Talk with your health care provider about how often you should have regular mammograms.  BRCA-related cancer screening. This may be done if you have a family history of breast, ovarian, tubal, or peritoneal cancers. Other tests  Sexually transmitted disease (STD) testing.  Bone density scan. This is done to screen for osteoporosis. You may have this done starting at age 19. Follow these instructions at home: Eating and drinking  Eat a  diet that includes fresh fruits and vegetables, whole grains, lean protein, and low-fat dairy products. Limit your intake of foods with high amounts of sugar, saturated fats, and salt.  Take vitamin and mineral supplements as recommended by your health care provider.  Do not drink alcohol if your health care provider tells you not to drink.  If you drink alcohol: ? Limit how much you have to 0-1 drink a day. ? Be aware of how much alcohol is in your drink. In the [place] one drink equals one 12 oz bottle of beer (355 mL), one 5 oz glass of wine (148 mL), or one 1 oz glass of hard liquor (44 mL). Lifestyle  Take daily care of your teeth and gums.  Stay active. Exercise for at least 30 minutes on 5 or more days each week.  Do not use any products that contain nicotine or tobacco, such as cigarettes, e-cigarettes, and chewing tobacco. If you need help quitting, ask your health care provider.  If you are sexually active, practice safe sex. Use a condom or other form of protection in order to prevent STIs (sexually transmitted infections).  Talk with your health care provider about taking a low-dose aspirin or statin. What's next?  Go to your health care provider once a year for a well check visit.  Ask your health care provider how often you should have your eyes and teeth checked.  Stay up to date on all vaccines. This information is not intended to replace advice given to you by your health care provider. Make sure you discuss any questions you have with your health care provider. Document Released: 12/26/2015 Document Revised: 11/23/2018 Document Reviewed: 11/23/2018 Elsevier Patient Education  2020 [employer].

## 2019-08-03 LAB — URINALYSIS, ROUTINE W REFLEX MICROSCOPIC
Bilirubin Urine: NEGATIVE
Hgb urine dipstick: NEGATIVE
Ketones, ur: NEGATIVE
Nitrite: NEGATIVE
Specific Gravity, Urine: 1.015 (ref 1.000–1.030)
Total Protein, Urine: NEGATIVE
Urine Glucose: NEGATIVE
Urobilinogen, UA: 0.2 (ref 0.0–1.0)
pH: 7.5 (ref 5.0–8.0)

## 2019-08-03 LAB — COMPREHENSIVE METABOLIC PANEL
ALT: 21 U/L (ref 0–35)
AST: 18 U/L (ref 0–37)
Albumin: 4.5 g/dL (ref 3.5–5.2)
Alkaline Phosphatase: 73 U/L (ref 39–117)
BUN: 20 mg/dL (ref 6–23)
CO2: 32 mEq/L (ref 19–32)
Calcium: 8.9 mg/dL (ref 8.4–10.5)
Chloride: 96 mEq/L (ref 96–112)
Creatinine, Ser: 0.76 mg/dL (ref 0.40–1.20)
GFR: 74.19 mL/min (ref >60.00)
Glucose, Bld: 100 mg/dL — ABNORMAL HIGH (ref 70–99)
Potassium: 4.8 mEq/L (ref 3.5–5.1)
Sodium: 135 mEq/L (ref 135–145)
Total Bilirubin: 0.3 mg/dL (ref 0.2–1.2)
Total Protein: 6.6 g/dL (ref 6.0–8.3)

## 2019-08-03 LAB — LIPID PANEL
Cholesterol: 155 mg/dL (ref 0–200)
HDL: 57.9 mg/dL (ref >39.00)
LDL Cholesterol: 73 mg/dL (ref 0–99)
NonHDL: 97.59
Total CHOL/HDL Ratio: 3
Triglycerides: 125 mg/dL (ref 0.0–149.0)
VLDL: 25 mg/dL (ref 0.0–40.0)

## 2019-08-03 LAB — URINE CULTURE
MICRO NUMBER:: 793446
Result:: NO GROWTH
SPECIMEN QUALITY:: ADEQUATE

## 2019-08-03 LAB — TSH: TSH: 1.06 u[IU]/mL (ref 0.35–4.50)

## 2019-08-03 LAB — CBC
HCT: 39.3 % (ref 36.0–46.0)
Hemoglobin: 13 g/dL (ref 12.0–15.0)
MCHC: 33.1 g/dL (ref 30.0–36.0)
MCV: 88.9 fl (ref 78.0–100.0)
Platelets: 275 10*3/uL (ref 150.0–400.0)
RBC: 4.42 Mil/uL (ref 3.87–5.11)
RDW: 13.2 % (ref 11.5–15.5)
WBC: 6.5 10*3/uL (ref 4.0–10.5)

## 2019-08-03 LAB — H. PYLORI ANTIBODY, IGG: H Pylori IgG: NEGATIVE

## 2019-08-03 LAB — HEMOGLOBIN A1C: Hgb A1c MFr Bld: 5.8 % (ref 4.6–6.5)

## 2019-08-05 DIAGNOSIS — R42 Dizziness and giddiness: Secondary | ICD-10-CM | POA: Insufficient documentation

## 2019-08-05 HISTORY — DX: Dizziness and giddiness: R42

## 2019-08-05 NOTE — Assessment & Plan Note (Signed)
Patient encouraged to maintain heart healthy diet, regular exercise, adequate sleep. Consider daily probiotics. Take medications as prescribed. Labs reviewed 

## 2019-08-05 NOTE — Assessment & Plan Note (Addendum)
She will monitor closely at home and if remains elevated may consider adding Hydralazine. no changes to meds. Encouraged heart healthy diet such as the DASH diet and exercise as tolerated.

## 2019-08-05 NOTE — Assessment & Plan Note (Signed)
Supplement and monitor 

## 2019-08-05 NOTE — Progress Notes (Signed)
Subjective:    Patient ID: Jackie Stevens, female    DOB: May 26, 1946, 73 y.o.   MRN: [id number]  Chief Complaint  Patient presents with  . Annual Exam    HPI Patient is in today for annual preventative exam and follow up on chronic medical concerns including hypertension, hyperlipidemia, and obesity. Her husband has recently been diagnosed with prostate cancer. He relies on her for his care so she has been under a great deal of stress. Denies CP/palp/SOB/HA/congestion/fevers/GI or GU c/o. Taking meds as prescribed. She is having significant knee pain but she stays active out of necessity. They are maintaining quarantine well. No recent febrile illness or hospitalizations.   Past Medical History:  Diagnosis Date  . Abnormal cervical cytology 10/25/2012   Follows with Dr Leavy Cella of Gyn  . Allergic rhinitis   . Anemia 10/06/2013  . Anginal pain ([place])    pt has history of CP states had cardiac workup with no specific issues identified   . Anxiety    when increased stress   . Arthritis    "knees; right thumb; shoulders" (2013/01/17)  . Asthma   . Chest pain   . Chicken pox as a child  . Complication of anesthesia   . Constipation   . Decreased hearing   . Dermatitis 03/06/2017  . Diverticulitis 10/25/2012   pt. reports that a drain was placed - 09/2012    . Dry mouth   . Excessive thirst   . External hemorrhoid, bleeding    "sometimes" (Jan 17, 2013)  . Frequent urination   . GERD (gastroesophageal reflux disease)   . H/O hiatal hernia   . Hand tingling   . Heart murmur   . HTN (hypertension)    stress test completed by Anselm Lis, diagnosed as GERD  . Hyperlipidemia   . Hypothyroidism   . Incontinence   . Infertility, female   . Insomnia   . Joint pain   . Kidney stones 1970's   "passed on their own" (01/17/2013)  . Knee pain   . Left shoulder pain 09/14/2016  . Low back pain 06/09/2014  . Measles as a child  . Medicare annual wellness visit, subsequent 10/06/2013    Steinhoffer [facility] Dermat[facility] Pneumovax in 2012   . Memory difficulties 09/05/2017  . Obesity 09/11/2017  . OSA on CPAP   . Palpitations   . Paresthesia 03/23/2015   Left face  . PONV (postoperative nausea and vomiting)   . Preventative health care 10/06/2013   [facility] Pneumovax in 2012  . Preventative health care 02/26/2016  . Rheumatoid arteritis ([place])   . Shortness of breath dyspnea    using stairs  . Sinus pain   . Sleep apnea   . Swelling of both lower extremities   . Thyroid cancer ([place]) 1980's  . Thyroid disease   . Tinnitus   . UTI (urinary tract infection) 04/02/2013    Past Surgical History:  Procedure Laterality Date  . CHOLECYSTECTOMY  1990  . COLON SURGERY    . COLOSTOMY REVISION  01/17/13   Procedure: COLON RESECTION SIGMOID;  Surgeon: Gwenyth Ober, MD;  Location: [facility];  Service: General;  Laterality: N/A;  . CYSTOSCOPY WITH STENT PLACEMENT  2013/01/17   Procedure: CYSTOSCOPY WITH STENT PLACEMENT;  Surgeon: Hanley Ben, MD;  Location: [facility];  Service: Urology;  Laterality: N/A;  . DILATION AND CURETTAGE OF UTERUS  1960's   "lots of them; had miscarriages" (17-Jan-2013)  . LYSIS OF ADHESION  N/A 12/02/2015   Procedure: LAPAROSCOPIC LYSIS OF ADHESION;  Surgeon: Johnathan Hausen, MD;  Location: [facility];  Service: General;  Laterality: N/A;  . ROBOTIC ASSISTED BILATERAL SALPINGO OOPHERECTOMY Bilateral 12/02/2015   Procedure: XI ROBOTIC ASSISTED BILATERAL SALPINGO OOPHORECTOMY;  Surgeon: Everitt Amber, MD;  Location: [facility];  Service: Gynecology;  Laterality: Bilateral;  . SIGMOID RESECTION / RECTOPEXY  12/19/2012  . THYROIDECTOMY, PARTIAL  1988   "then did iodine to remove the rest" (12/19/2012)  . TONSILLECTOMY  1951?  . TRANSRECTAL DRAINAGE OF PELVIC ABSCESS  10/27/2012  . VAGINAL HYSTERECTOMY  1970's   "still have my ovaries" (12/19/2012)    Family History  Problem Relation Age of Onset  . Heart disease Father   . Pneumonia Father   . Hypertension  Father   . Hyperlipidemia Father   . Cancer Father        skin  . Stroke Father   . Alzheimer's disease Mother   . Heart disease Mother   . Depression Mother   . Emphysema Brother        marijuana and cigarettes  . Alcohol abuse Brother   . Hearing loss Brother   . Diabetes Maternal Grandmother   . Alzheimer's disease Paternal Grandmother   . Cancer Paternal Grandmother        lung?- smoker  . Hyperlipidemia Paternal Grandmother   . Heart attack Paternal Grandfather   . Alcohol abuse Paternal Grandfather   . Neurofibromatosis Son        schwanomatosis  . Neurofibromatosis Son        swanomatosis  . Cancer Paternal Aunt     Social History   Socioeconomic History  . Marital status: Married    Spouse name: [name]  . Number of children: 2  . Years of education: Not on file  . Highest education level: Not on file  Occupational History  . Occupation: Retired  Scientific laboratory technician  . Financial resource strain: Not on file  . Food insecurity    Worry: Not on file    Inability: Not on file  . Transportation needs    Medical: Not on file    Non-medical: Not on file  Tobacco Use  . Smoking status: Never Smoker  . Smokeless tobacco: Never Used  Substance and Sexual Activity  . Alcohol use: No    Alcohol/week: 0.0 standard drinks  . Drug use: No  . Sexual activity: Not Currently  Lifestyle  . Physical activity    Days per week: Not on file    Minutes per session: Not on file  . Stress: Not on file  Relationships  . Social Herbalist on phone: Not on file    Gets together: Not on file    Attends religious service: Not on file    Active member of club or organization: Not on file    Attends meetings of clubs or organizations: Not on file    Relationship status: Not on file  . Intimate partner violence    Fear of current or ex partner: Not on file    Emotionally abused: Not on file    Physically abused: Not on file    Forced sexual activity: Not on file  Other  Topics Concern  . Not on file  Social History Narrative   Married    children    Outpatient Medications Prior to Visit  Medication Sig Dispense Refill  . azelastine (ASTELIN) 0.1 % nasal spray Place 2 sprays into both  nostrils at bedtime as needed for rhinitis. Use in each nostril as directed 30 mL 3  . docusate sodium (COLACE) 100 MG capsule Take 100 mg by mouth 2 (two) times daily. Reported on 03/30/2016    . fluticasone (FLONASE) 50 MCG/ACT nasal spray Place 2 sprays into both nostrils daily. 48 g 3  . hydrochlorothiazide (HYDRODIURIL) 25 MG tablet TAKE 1 TABLET BY MOUTH EVERY DAY 90 tablet 1  . hyoscyamine (LEVSIN SL) 0.125 MG SL tablet Place 1 tablet (0.125 mg total) under the tongue every 4 (four) hours as needed. 30 tablet 1  . KETOCONAZOLE, TOPICAL, 1 % SHAM Apply 1 Dose topically once a week. 200 mL 1  . levothyroxine (SYNTHROID, LEVOTHROID) 150 MCG tablet Take 150 mcg by mouth daily before breakfast.    . losartan (COZAAR) 100 MG tablet TAKE 1 TABLET BY MOUTH EVERY DAY 90 tablet 1  . metoprolol tartrate (LOPRESSOR) 25 MG tablet Take 3 tablets (75 mg total) by mouth 2 (two) times daily. 180 tablet 1  . Multiple Vitamins-Minerals (MULTIVITAMIN WOMEN 50+ PO) Take 1 tablet by mouth daily.    . naproxen (NAPROSYN) 375 MG tablet TAKE 1 TABLET (375 MG TOTAL) BY MOUTH 2 (TWO) TIMES DAILY WITH A MEAL. 180 tablet 1  . nitroGLYCERIN (NITROSTAT) 0.4 MG SL tablet PLACE 1 TABLET UNDER THE TONGUE EVERY 5 MINUTES AS NEEDED FOR CHEST PAIN 25 tablet 1  . omeprazole (PRILOSEC) 40 MG capsule TAKE 1 CAPSULE BY MOUTH TWICE A DAY 180 capsule 1  . simvastatin (ZOCOR) 40 MG tablet TAKE 1 TABLET BY MOUTH EVERYDAY AT BEDTIME 90 tablet 1  . TURMERIC PO Take 1 tablet by mouth daily. Reported on 12/19/2015    . Vitamin D, Cholecalciferol, 1000 UNITS CAPS Take 1 capsule by mouth daily.    . benzonatate (TESSALON) 100 MG capsule Take 1 capsule (100 mg total) by mouth 3 (three) times daily as needed for cough. 30  capsule 0  . famotidine (PEPCID) 40 MG tablet Take 1 tablet (40 mg total) by mouth daily. 90 tablet 1   No facility-administered medications prior to visit.     Allergies  Allergen Reactions  . Neomycin-Bacitracin Zn-Polymyx Rash    Polysporin- is tolerated   . Niacin Other (See Comments) and Cough    "cough til I threw up" (12/19/2012)  . Ciprofloxacin Hives    Got cipro and flagyl at same time, localized hives to IV arm  . Flagyl [Metronidazole] Hives    Got cipro and flagyl at same time, localized hives to IV arm    Review of Systems  Constitutional: Positive for malaise/fatigue. Negative for chills and fever.  HENT: Negative for congestion and hearing loss.   Eyes: Negative for discharge.  Respiratory: Negative for cough, sputum production and shortness of breath.   Cardiovascular: Negative for chest pain, palpitations and leg swelling.  Gastrointestinal: Negative for abdominal pain, blood in stool, constipation, diarrhea, heartburn, nausea and vomiting.  Genitourinary: Negative for dysuria, frequency, hematuria and urgency.  Musculoskeletal: Negative for back pain, falls and myalgias.  Skin: Negative for rash.  Neurological: Negative for dizziness, sensory change, loss of consciousness, weakness and headaches.  Endo/Heme/Allergies: Negative for environmental allergies. Does not bruise/bleed easily.  Psychiatric/Behavioral: Negative for depression and suicidal ideas. The patient is not nervous/anxious and does not have insomnia.        Objective:    Physical Exam Constitutional:      General: She is not in acute distress.    Appearance: She  is not diaphoretic.  HENT:     Head: Normocephalic and atraumatic.     Right Ear: External ear normal.     Left Ear: External ear normal.     Nose: Nose normal.     Mouth/Throat:     Pharynx: No oropharyngeal exudate.  Eyes:     General: No scleral icterus.       Right eye: No discharge.        Left eye: No discharge.      Conjunctiva/sclera: Conjunctivae normal.     Pupils: Pupils are equal, round, and reactive to light.  Neck:     Musculoskeletal: Normal range of motion and neck supple.     Thyroid: No thyromegaly.  Cardiovascular:     Rate and Rhythm: Normal rate and regular rhythm.     Heart sounds: Normal heart sounds. No murmur.  Pulmonary:     Effort: Pulmonary effort is normal. No respiratory distress.     Breath sounds: Normal breath sounds. No wheezing or rales.  Abdominal:     General: Bowel sounds are normal. There is no distension.     Palpations: Abdomen is soft. There is no mass.     Tenderness: There is no abdominal tenderness.  Musculoskeletal: Normal range of motion.        General: No tenderness.  Lymphadenopathy:     Cervical: No cervical adenopathy.  Skin:    General: Skin is warm and dry.     Findings: No rash.  Neurological:     Mental Status: She is alert and oriented to person, place, and time.     Cranial Nerves: No cranial nerve deficit.     Coordination: Coordination normal.     Deep Tendon Reflexes: Reflexes are normal and symmetric. Reflexes normal.     BP (!) 155/55 (BP Location: Right Arm, Cuff Size: Large)   Pulse (!) 57   Temp 98.6 F (37 C) (Oral)   Resp 12   Ht 5\' 2"  (1.575 m)   Wt 187 lb (84.8 kg)   SpO2 99%   BMI 34.20 kg/m  Wt Readings from Last 3 Encounters:  08/02/19 187 lb (84.8 kg)  04/29/19 180 lb (81.6 kg)  02/05/19 168 lb 6.4 oz (76.4 kg)    Diabetic Foot Exam - Simple   No data filed     Lab Results  Component Value Date   WBC 6.5 08/02/2019   HGB 13.0 08/02/2019   HCT 39.3 08/02/2019   PLT 275.0 08/02/2019   GLUCOSE 100 (H) 08/02/2019   CHOL 155 08/02/2019   TRIG 125.0 08/02/2019   HDL 57.90 08/02/2019   LDLCALC 73 08/02/2019   ALT 21 08/02/2019   AST 18 08/02/2019   NA 135 08/02/2019   K 4.8 08/02/2019   CL 96 08/02/2019   CREATININE 0.76 08/02/2019   BUN 20 08/02/2019   CO2 32 08/02/2019   TSH 1.06 08/02/2019   INR  0.99 03/16/2015   HGBA1C 5.8 08/02/2019    Lab Results  Component Value Date   TSH 1.06 08/02/2019   Lab Results  Component Value Date   WBC 6.5 08/02/2019   HGB 13.0 08/02/2019   HCT 39.3 08/02/2019   MCV 88.9 08/02/2019   PLT 275.0 08/02/2019   Lab Results  Component Value Date   NA 135 08/02/2019   K 4.8 08/02/2019   CO2 32 08/02/2019   GLUCOSE 100 (H) 08/02/2019   BUN 20 08/02/2019   CREATININE 0.76 08/02/2019  BILITOT 0.3 08/02/2019   ALKPHOS 73 08/02/2019   AST 18 08/02/2019   ALT 21 08/02/2019   PROT 6.6 08/02/2019   ALBUMIN 4.5 08/02/2019   CALCIUM 8.9 08/02/2019   ANIONGAP 12 07/17/2019   GFR 74.19 08/02/2019   Lab Results  Component Value Date   CHOL 155 08/02/2019   Lab Results  Component Value Date   HDL 57.90 08/02/2019   Lab Results  Component Value Date   LDLCALC 73 08/02/2019   Lab Results  Component Value Date   TRIG 125.0 08/02/2019   Lab Results  Component Value Date   CHOLHDL 3 08/02/2019   Lab Results  Component Value Date   HGBA1C 5.8 08/02/2019       Assessment & Plan:   Problem List Items Addressed This Visit    Hyperlipidemia, mixed    Encouraged heart healthy diet, increase exercise, avoid trans fats, consider a krill oil cap daily. Tolerating Simvastatin      Essential hypertension     She will monitor closely at home and if remains elevated may consider adding Hydralazine. no changes to meds. Encouraged heart healthy diet such as the DASH diet and exercise as tolerated.       Hypothyroidism    On Levothyroxine, continue to monitor      Preventative health care - Primary    Patient encouraged to maintain heart healthy diet, regular exercise, adequate sleep. Consider daily probiotics. Take medications as prescribed. Labs reviewed      Vitamin B12 deficiency    Supplement and monitor      Obesity    Encouraged DASH diet, decrease po intake and increase exercise as tolerated. Needs 7-8 hours of sleep nightly.  Avoid trans fats, eat small, frequent meals every 4-5 hours with lean proteins, complex carbs and healthy fats. Minimize simple carbs, consider the weight watchers APP      Vitamin D deficiency    Supplement and monitor      Vertigo    Hydrate, move slowly and given a prescription for Meclizine       Other Visit Diagnoses    Dysuria       Relevant Orders   Urinalysis, Routine w reflex microscopic (Completed)   Abdominal pain, unspecified abdominal location       Relevant Orders   H. pylori antibody, IgG (Completed)   DG Abd 2 Views (Completed)   Anorexia          I have discontinued Monet P. Dalpe's benzonatate and famotidine. I am also having her start on triamcinolone cream and meclizine. Additionally, I am having her maintain her TURMERIC PO, Vitamin D (Cholecalciferol), docusate sodium, fluticasone, Multiple Vitamins-Minerals (MULTIVITAMIN WOMEN 50+ PO), KETOCONAZOLE (TOPICAL), levothyroxine, nitroGLYCERIN, hyoscyamine, azelastine, simvastatin, omeprazole, naproxen, losartan, hydrochlorothiazide, and metoprolol tartrate.  Meds ordered this encounter  Medications  . triamcinolone cream (KENALOG) 0.1 %    Sig: Apply 1 application topically 2 (two) times daily.    Dispense:  30 g    Refill:  1  . meclizine (ANTIVERT) 25 MG tablet    Sig: Take 1 tablet (25 mg total) by mouth 3 (three) times daily as needed for dizziness.    Dispense:  30 tablet    Refill:  1     Penni Homans, MD

## 2019-08-05 NOTE — Assessment & Plan Note (Signed)
Hydrate, move slowly and given a prescription for Meclizine

## 2019-08-05 NOTE — Assessment & Plan Note (Addendum)
Encouraged heart healthy diet, increase exercise, avoid trans fats, consider a krill oil cap daily. Tolerating Simvastatin.  

## 2019-08-05 NOTE — Assessment & Plan Note (Signed)
Encouraged DASH diet, decrease po intake and increase exercise as tolerated. Needs 7-8 hours of sleep nightly. Avoid trans fats, eat small, frequent meals every 4-5 hours with lean proteins, complex carbs and healthy fats. Minimize simple carbs, consider the weight watchers APP

## 2019-08-05 NOTE — Assessment & Plan Note (Signed)
On Levothyroxine, continue to monitor 

## 2019-08-08 ENCOUNTER — Other Ambulatory Visit: Payer: Self-pay

## 2019-08-08 ENCOUNTER — Ambulatory Visit (HOSPITAL_BASED_OUTPATIENT_CLINIC_OR_DEPARTMENT_OTHER)
Admission: RE | Admit: 2019-08-08 | Discharge: 2019-08-08 | Disposition: A | Discharge status: Home / Self Care | Payer: PPO | Source: Ambulatory Visit | Attending: Family Medicine | Admitting: Family Medicine

## 2019-08-08 ENCOUNTER — Encounter (HOSPITAL_BASED_OUTPATIENT_CLINIC_OR_DEPARTMENT_OTHER): Payer: Self-pay

## 2019-08-08 DIAGNOSIS — R1084 Generalized abdominal pain: Secondary | ICD-10-CM | POA: Diagnosis not present

## 2019-08-08 DIAGNOSIS — R1032 Left lower quadrant pain: Secondary | ICD-10-CM | POA: Diagnosis not present

## 2019-08-08 DIAGNOSIS — K439 Ventral hernia without obstruction or gangrene: Secondary | ICD-10-CM | POA: Diagnosis not present

## 2019-08-08 MED ORDER — IOHEXOL 300 MG/ML  SOLN
100.0000 mL | Freq: Once | INTRAMUSCULAR | Status: AC | PRN
  Administered 2019-08-08: 100 mL via INTRAVENOUS

## 2019-08-12 DIAGNOSIS — G4733 Obstructive sleep apnea (adult) (pediatric): Secondary | ICD-10-CM | POA: Diagnosis not present

## 2019-08-13 DIAGNOSIS — G4733 Obstructive sleep apnea (adult) (pediatric): Secondary | ICD-10-CM | POA: Diagnosis not present

## 2019-08-16 ENCOUNTER — Other Ambulatory Visit: Payer: Self-pay | Admitting: Family Medicine

## 2019-08-17 DIAGNOSIS — J45909 Unspecified asthma, uncomplicated: Secondary | ICD-10-CM | POA: Diagnosis not present

## 2019-08-17 DIAGNOSIS — E785 Hyperlipidemia, unspecified: Secondary | ICD-10-CM | POA: Diagnosis not present

## 2019-08-17 DIAGNOSIS — N200 Calculus of kidney: Secondary | ICD-10-CM | POA: Diagnosis not present

## 2019-08-17 DIAGNOSIS — G4730 Sleep apnea, unspecified: Secondary | ICD-10-CM | POA: Diagnosis not present

## 2019-08-17 DIAGNOSIS — C73 Malignant neoplasm of thyroid gland: Secondary | ICD-10-CM | POA: Diagnosis not present

## 2019-08-17 DIAGNOSIS — E871 Hypo-osmolality and hyponatremia: Secondary | ICD-10-CM | POA: Diagnosis not present

## 2019-08-17 DIAGNOSIS — I779 Disorder of arteries and arterioles, unspecified: Secondary | ICD-10-CM | POA: Diagnosis not present

## 2019-08-17 DIAGNOSIS — E890 Postprocedural hypothyroidism: Secondary | ICD-10-CM | POA: Diagnosis not present

## 2019-08-17 DIAGNOSIS — E669 Obesity, unspecified: Secondary | ICD-10-CM | POA: Diagnosis not present

## 2019-08-17 DIAGNOSIS — I10 Essential (primary) hypertension: Secondary | ICD-10-CM | POA: Diagnosis not present

## 2019-08-17 DIAGNOSIS — I5189 Other ill-defined heart diseases: Secondary | ICD-10-CM | POA: Diagnosis not present

## 2019-08-17 DIAGNOSIS — I700 Atherosclerosis of aorta: Secondary | ICD-10-CM | POA: Diagnosis not present

## 2019-09-03 DIAGNOSIS — Z1231 Encounter for screening mammogram for malignant neoplasm of breast: Secondary | ICD-10-CM | POA: Diagnosis not present

## 2019-09-03 DIAGNOSIS — H903 Sensorineural hearing loss, bilateral: Secondary | ICD-10-CM | POA: Diagnosis not present

## 2019-09-03 DIAGNOSIS — H608X3 Other otitis externa, bilateral: Secondary | ICD-10-CM | POA: Diagnosis not present

## 2019-09-03 DIAGNOSIS — Z803 Family history of malignant neoplasm of breast: Secondary | ICD-10-CM | POA: Diagnosis not present

## 2019-09-03 DIAGNOSIS — R42 Dizziness and giddiness: Secondary | ICD-10-CM | POA: Diagnosis not present

## 2019-09-03 LAB — HM MAMMOGRAPHY

## 2019-09-06 DIAGNOSIS — H43813 Vitreous degeneration, bilateral: Secondary | ICD-10-CM | POA: Diagnosis not present

## 2019-09-06 DIAGNOSIS — H25813 Combined forms of age-related cataract, bilateral: Secondary | ICD-10-CM | POA: Diagnosis not present

## 2019-09-06 DIAGNOSIS — H524 Presbyopia: Secondary | ICD-10-CM | POA: Diagnosis not present

## 2019-09-06 DIAGNOSIS — H5203 Hypermetropia, bilateral: Secondary | ICD-10-CM | POA: Diagnosis not present

## 2019-09-06 DIAGNOSIS — H52223 Regular astigmatism, bilateral: Secondary | ICD-10-CM | POA: Diagnosis not present

## 2019-09-12 ENCOUNTER — Other Ambulatory Visit: Payer: Self-pay

## 2019-09-12 DIAGNOSIS — G4733 Obstructive sleep apnea (adult) (pediatric): Secondary | ICD-10-CM | POA: Diagnosis not present

## 2019-09-13 ENCOUNTER — Other Ambulatory Visit: Payer: Self-pay

## 2019-09-13 ENCOUNTER — Ambulatory Visit (INDEPENDENT_AMBULATORY_CARE_PROVIDER_SITE_OTHER): Payer: PPO | Admitting: Family Medicine

## 2019-09-13 ENCOUNTER — Encounter: Payer: Self-pay | Admitting: Neurology

## 2019-09-13 VITALS — BP 160/80 | HR 58 | Temp 97.2°F | Resp 18 | Wt 194.0 lb

## 2019-09-13 DIAGNOSIS — E559 Vitamin D deficiency, unspecified: Secondary | ICD-10-CM

## 2019-09-13 DIAGNOSIS — I10 Essential (primary) hypertension: Secondary | ICD-10-CM

## 2019-09-13 DIAGNOSIS — E66812 Obesity, class 2: Secondary | ICD-10-CM

## 2019-09-13 DIAGNOSIS — G3184 Mild cognitive impairment, so stated: Secondary | ICD-10-CM | POA: Diagnosis not present

## 2019-09-13 DIAGNOSIS — R413 Other amnesia: Secondary | ICD-10-CM

## 2019-09-13 DIAGNOSIS — J4520 Mild intermittent asthma, uncomplicated: Secondary | ICD-10-CM | POA: Diagnosis not present

## 2019-09-13 DIAGNOSIS — E538 Deficiency of other specified B group vitamins: Secondary | ICD-10-CM

## 2019-09-13 MED ORDER — METOPROLOL TARTRATE 100 MG PO TABS: 100.0000 mg | ORAL_TABLET | Freq: Two times a day (BID) | ORAL | 1 refills | Status: DC

## 2019-09-13 NOTE — Patient Instructions (Addendum)
Multivitamin with minerals with zinc, selenium, vitamin C, vitamin d  Weight Watchers APP  And/or  Call [facility] for another appointment   Hypertension, Adult High blood pressure (hypertension) is when the force of blood pumping through the arteries is too strong. The arteries are the blood vessels that carry blood from the heart throughout the body. Hypertension forces the heart to work harder to pump blood and may cause arteries to become narrow or stiff. Untreated or uncontrolled hypertension can cause a heart attack, heart failure, a stroke, kidney disease, and other problems. A blood pressure reading consists of a higher number over a lower number. Ideally, your blood pressure should be below 120/80. The first ("top") number is called the systolic pressure. It is a measure of the pressure in your arteries as your heart beats. The second ("bottom") number is called the diastolic pressure. It is a measure of the pressure in your arteries as the heart relaxes. What are the causes? The exact cause of this condition is not known. There are some conditions that result in or are related to high blood pressure. What increases the risk? Some risk factors for high blood pressure are under your control. The following factors may make you more likely to develop this condition:  Smoking.  Having type 2 diabetes mellitus, high cholesterol, or both.  Not getting enough exercise or physical activity.  Being overweight.  Having too much fat, sugar, calories, or salt (sodium) in your diet.  Drinking too much alcohol. Some risk factors for high blood pressure may be difficult or impossible to change. Some of these factors include:  Having chronic kidney disease.  Having a family history of high blood pressure.  Age. Risk increases with age.  Race. You may be at higher risk if you are African American.  Gender. Men are at higher risk than women before age 68. After age 59,  women are at higher risk than men.  Having obstructive sleep apnea.  Stress. What are the signs or symptoms? High blood pressure may not cause symptoms. Very high blood pressure (hypertensive crisis) may cause:  Headache.  Anxiety.  Shortness of breath.  Nosebleed.  Nausea and vomiting.  Vision changes.  Severe chest pain.  Seizures. How is this diagnosed? This condition is diagnosed by measuring your blood pressure while you are seated, with your arm resting on a flat surface, your legs uncrossed, and your feet flat on the floor. The cuff of the blood pressure monitor will be placed directly against the skin of your upper arm at the level of your heart. It should be measured at least twice using the same arm. Certain conditions can cause a difference in blood pressure between your right and left arms. Certain factors can cause blood pressure readings to be lower or higher than normal for a short period of time:  When your blood pressure is higher when you are in a health care provider's office than when you are at home, this is called white coat hypertension. Most people with this condition do not need medicines.  When your blood pressure is higher at home than when you are in a health care provider's office, this is called masked hypertension. Most people with this condition may need medicines to control blood pressure. If you have a high blood pressure reading during one visit or you have normal blood pressure with other risk factors, you may be asked to:  Return on a different day to have your blood  pressure checked again.  Monitor your blood pressure at home for 1 week or longer. If you are diagnosed with hypertension, you may have other blood or imaging tests to help your health care provider understand your overall risk for other conditions. How is this treated? This condition is treated by making healthy lifestyle changes, such as eating healthy foods, exercising more,  and reducing your alcohol intake. Your health care provider may prescribe medicine if lifestyle changes are not enough to get your blood pressure under control, and if:  Your systolic blood pressure is above 130.  Your diastolic blood pressure is above 80. Your personal target blood pressure may vary depending on your medical conditions, your age, and other factors. Follow these instructions at home: Eating and drinking   Eat a diet that is high in fiber and potassium, and low in sodium, added sugar, and fat. An example eating plan is called the DASH (Dietary Approaches to Stop Hypertension) diet. To eat this way: ? Eat plenty of fresh fruits and vegetables. Try to fill one half of your plate at each meal with fruits and vegetables. ? Eat whole grains, such as whole-wheat pasta, brown rice, or whole-grain bread. Fill about one fourth of your plate with whole grains. ? Eat or drink low-fat dairy products, such as skim milk or low-fat yogurt. ? Avoid fatty cuts of meat, processed or cured meats, and poultry with skin. Fill about one fourth of your plate with lean proteins, such as fish, chicken without skin, beans, eggs, or tofu. ? Avoid pre-made and processed foods. These tend to be higher in sodium, added sugar, and fat.  Reduce your daily sodium intake. Most people with hypertension should eat less than 1,500 mg of sodium a day.  Do not drink alcohol if: ? Your health care provider tells you not to drink. ? You are pregnant, may be pregnant, or are planning to become pregnant.  If you drink alcohol: ? Limit how much you use to:  0-1 drink a day for women.  0-2 drinks a day for men. ? Be aware of how much alcohol is in your drink. In the [place] one drink equals one 12 oz bottle of beer (355 mL), one 5 oz glass of wine (148 mL), or one 1 oz glass of hard liquor (44 mL). Lifestyle   Work with your health care provider to maintain a healthy body weight or to lose weight. Ask what an  ideal weight is for you.  Get at least 30 minutes of exercise most days of the week. Activities may include walking, swimming, or biking.  Include exercise to strengthen your muscles (resistance exercise), such as Pilates or lifting weights, as part of your weekly exercise routine. Try to do these types of exercises for 30 minutes at least 3 days a week.  Do not use any products that contain nicotine or tobacco, such as cigarettes, e-cigarettes, and chewing tobacco. If you need help quitting, ask your health care provider.  Monitor your blood pressure at home as told by your health care provider.  Keep all follow-up visits as told by your health care provider. This is important. Medicines  Take over-the-counter and prescription medicines only as told by your health care provider. Follow directions carefully. Blood pressure medicines must be taken as prescribed.  Do not skip doses of blood pressure medicine. Doing this puts you at risk for problems and can make the medicine less effective.  Ask your health care provider about side effects  or reactions to medicines that you should watch for. Contact a health care provider if you:  Think you are having a reaction to a medicine you are taking.  Have headaches that keep coming back (recurring).  Feel dizzy.  Have swelling in your ankles.  Have trouble with your vision. Get help right away if you:  Develop a severe headache or confusion.  Have unusual weakness or numbness.  Feel faint.  Have severe pain in your chest or abdomen.  Vomit repeatedly.  Have trouble breathing. Summary  Hypertension is when the force of blood pumping through your arteries is too strong. If this condition is not controlled, it may put you at risk for serious complications.  Your personal target blood pressure may vary depending on your medical conditions, your age, and other factors. For most people, a normal blood pressure is less than 120/80.   Hypertension is treated with lifestyle changes, medicines, or a combination of both. Lifestyle changes include losing weight, eating a healthy, low-sodium diet, exercising more, and limiting alcohol. This information is not intended to replace advice given to you by your health care provider. Make sure you discuss any questions you have with your health care provider. Document Released: 11/29/2005 Document Revised: 08/09/2018 Document Reviewed: 08/09/2018 Elsevier Patient Education  2020 [employer].

## 2019-09-14 DIAGNOSIS — G4733 Obstructive sleep apnea (adult) (pediatric): Secondary | ICD-10-CM | POA: Diagnosis not present

## 2019-09-15 NOTE — Assessment & Plan Note (Signed)
Poorly controlled will alter medications, encouraged DASH diet, minimize caffeine and obtain adequate sleep. Report concerning symptoms and follow up as directed and as needed. Increase Metoprolol to 100 mg bid and monitor

## 2019-09-15 NOTE — Assessment & Plan Note (Signed)
Supplement and monitor 

## 2019-09-15 NOTE — Assessment & Plan Note (Signed)
No recent exacerbation 

## 2019-09-15 NOTE — Assessment & Plan Note (Signed)
Encouraged DASH diet, decrease po intake and increase exercise as tolerated. Needs 7-8 hours of sleep nightly. Avoid trans fats, eat small, frequent meals every 4-5 hours with lean proteins, complex carbs and healthy fats. Minimize simple carbs, consider [facility] or return to [facility]

## 2019-09-15 NOTE — Progress Notes (Signed)
Subjective:    Patient ID: Jackie Stevens, female    DOB: 1946-04-04, 73 y.o.   MRN: [id number]  No chief complaint on file.   HPI Patient is in today for follow up on chronic medical concerns including hypertension, vitamin D deficiency and asthma. No recent febrile illness or hospitalizations. They are maintaining quarantine well and she is staying in and caring for her husband with significant health concerns. She is noting worse of issues with her memory which is starting to affect even ADLs at times. She also notes a worsening unsteady fait. Denies CP/palp/SOB/HA/congestion/fevers/GI or GU c/o. Taking meds as prescribed  Past Medical History:  Diagnosis Date  . Abnormal cervical cytology 10/25/2012   Follows with Dr Leavy Cella of Gyn  . Allergic rhinitis   . Anemia 10/06/2013  . Anginal pain ([place])    pt has history of CP states had cardiac workup with no specific issues identified   . Anxiety    when increased stress   . Arthritis    "knees; right thumb; shoulders" (Jan 13, 2013)  . Asthma   . Chest pain   . Chicken pox as a child  . Complication of anesthesia   . Constipation   . Decreased hearing   . Dermatitis 03/06/2017  . Diverticulitis 10/25/2012   pt. reports that a drain was placed - 09/2012    . Dry mouth   . Excessive thirst   . External hemorrhoid, bleeding    "sometimes" (13-Jan-2013)  . Frequent urination   . GERD (gastroesophageal reflux disease)   . H/O hiatal hernia   . Hand tingling   . Heart murmur   . HTN (hypertension)    stress test completed by Anselm Lis, diagnosed as GERD  . Hyperlipidemia   . Hypothyroidism   . Incontinence   . Infertility, female   . Insomnia   . Joint pain   . Kidney stones 1970's   "passed on their own" (January 13, 2013)  . Knee pain   . Left shoulder pain 09/14/2016  . Low back pain 06/09/2014  . Measles as a child  . Medicare annual wellness visit, subsequent 10/06/2013   Steinhoffer of Dermatology Pneumovax in 2012    . Memory difficulties 09/05/2017  . Obesity 09/11/2017  . OSA on CPAP   . Palpitations   . Paresthesia 03/23/2015   Left face  . PONV (postoperative nausea and vomiting)   . Preventative health care 10/06/2013   Steinhoffer of Dermatology Pneumovax in 2012  . Preventative health care 02/26/2016  . Rheumatoid arteritis ([place])   . Shortness of breath dyspnea    using stairs  . Sinus pain   . Sleep apnea   . Swelling of both lower extremities   . Thyroid cancer ([place]) 1980's  . Thyroid disease   . Tinnitus   . UTI (urinary tract infection) 04/02/2013    Past Surgical History:  Procedure Laterality Date  . CHOLECYSTECTOMY  1990  . COLON SURGERY    . COLOSTOMY REVISION  01/13/2013   Procedure: COLON RESECTION SIGMOID;  Surgeon: Gwenyth Ober, MD;  Location: [facility];  Service: General;  Laterality: N/A;  . CYSTOSCOPY WITH STENT PLACEMENT  01/13/13   Procedure: CYSTOSCOPY WITH STENT PLACEMENT;  Surgeon: Hanley Ben, MD;  Location: [facility];  Service: Urology;  Laterality: N/A;  . DILATION AND CURETTAGE OF UTERUS  1960's   "lots of them; had miscarriages" (Jan 13, 2013)  . LYSIS OF ADHESION N/A 12/02/2015   Procedure: LAPAROSCOPIC LYSIS OF ADHESION;  Surgeon: Johnathan Hausen, MD;  Location: [facility];  Service: General;  Laterality: N/A;  . ROBOTIC ASSISTED BILATERAL SALPINGO OOPHERECTOMY Bilateral 12/02/2015   Procedure: XI ROBOTIC ASSISTED BILATERAL SALPINGO OOPHORECTOMY;  Surgeon: Everitt Amber, MD;  Location: [facility];  Service: Gynecology;  Laterality: Bilateral;  . SIGMOID RESECTION / RECTOPEXY  12/19/2012  . THYROIDECTOMY, PARTIAL  1988   "then did iodine to remove the rest" (12/19/2012)  . TONSILLECTOMY  1951?  . TRANSRECTAL DRAINAGE OF PELVIC ABSCESS  10/27/2012  . VAGINAL HYSTERECTOMY  1970's   "still have my ovaries" (12/19/2012)    Family History  Problem Relation Age of Onset  . Heart disease Father   . Pneumonia Father   . Hypertension Father   . Hyperlipidemia Father   . Cancer Father         skin  . Stroke Father   . Alzheimer's disease Mother   . Heart disease Mother   . Depression Mother   . Emphysema Brother        marijuana and cigarettes  . Alcohol abuse Brother   . Hearing loss Brother   . Diabetes Maternal Grandmother   . Alzheimer's disease Paternal Grandmother   . Cancer Paternal Grandmother        lung?- smoker  . Hyperlipidemia Paternal Grandmother   . Heart attack Paternal Grandfather   . Alcohol abuse Paternal Grandfather   . Neurofibromatosis Son        schwanomatosis  . Neurofibromatosis Son        swanomatosis  . Cancer Paternal Aunt     Social History   Socioeconomic History  . Marital status: Married    Spouse name: [name]  . Number of children: 2  . Years of education: Not on file  . Highest education level: Not on file  Occupational History  . Occupation: Retired  Scientific laboratory technician  . Financial resource strain: Not on file  . Food insecurity    Worry: Not on file    Inability: Not on file  . Transportation needs    Medical: Not on file    Non-medical: Not on file  Tobacco Use  . Smoking status: Never Smoker  . Smokeless tobacco: Never Used  Substance and Sexual Activity  . Alcohol use: No    Alcohol/week: 0.0 standard drinks  . Drug use: No  . Sexual activity: Not Currently  Lifestyle  . Physical activity    Days per week: Not on file    Minutes per session: Not on file  . Stress: Not on file  Relationships  . Social Herbalist on phone: Not on file    Gets together: Not on file    Attends religious service: Not on file    Active member of club or organization: Not on file    Attends meetings of clubs or organizations: Not on file    Relationship status: Not on file  . Intimate partner violence    Fear of current or ex partner: Not on file    Emotionally abused: Not on file    Physically abused: Not on file    Forced sexual activity: Not on file  Other Topics Concern  . Not on file  Social History  Narrative   Married    children    Outpatient Medications Prior to Visit  Medication Sig Dispense Refill  . azelastine (ASTELIN) 0.1 % nasal spray Place 2 sprays into both nostrils at bedtime as needed for rhinitis. Use in each  nostril as directed 30 mL 3  . fluticasone (FLONASE) 50 MCG/ACT nasal spray Place 2 sprays into both nostrils daily. 48 g 3  . hydrochlorothiazide (HYDRODIURIL) 25 MG tablet TAKE 1 TABLET BY MOUTH EVERY DAY 90 tablet 1  . hyoscyamine (LEVSIN SL) 0.125 MG SL tablet Place 1 tablet (0.125 mg total) under the tongue every 4 (four) hours as needed. 30 tablet 1  . KETOCONAZOLE, TOPICAL, 1 % SHAM Apply 1 Dose topically once a week. 200 mL 1  . levothyroxine (SYNTHROID, LEVOTHROID) 150 MCG tablet Take 150 mcg by mouth daily before breakfast.    . losartan (COZAAR) 100 MG tablet TAKE 1 TABLET BY MOUTH EVERY DAY 90 tablet 1  . meclizine (ANTIVERT) 25 MG tablet Take 1 tablet (25 mg total) by mouth 3 (three) times daily as needed for dizziness. 30 tablet 1  . Multiple Vitamins-Minerals (MULTIVITAMIN WOMEN 50+ PO) Take 1 tablet by mouth daily.    . naproxen (NAPROSYN) 375 MG tablet TAKE 1 TABLET (375 MG TOTAL) BY MOUTH 2 (TWO) TIMES DAILY WITH A MEAL. 180 tablet 1  . nitroGLYCERIN (NITROSTAT) 0.4 MG SL tablet PLACE 1 TABLET UNDER THE TONGUE EVERY 5 MINUTES AS NEEDED FOR CHEST PAIN 25 tablet 1  . omeprazole (PRILOSEC) 40 MG capsule TAKE 1 CAPSULE BY MOUTH TWICE A DAY 180 capsule 1  . simvastatin (ZOCOR) 40 MG tablet TAKE 1 TABLET BY MOUTH EVERYDAY AT BEDTIME 90 tablet 1  . triamcinolone cream (KENALOG) 0.1 % Apply 1 application topically 2 (two) times daily. 30 g 1  . Vitamin D, Cholecalciferol, 1000 UNITS CAPS Take 1 capsule by mouth daily.    Marland Kitchen docusate sodium (COLACE) 100 MG capsule Take 100 mg by mouth 2 (two) times daily. Reported on 03/30/2016    . metoprolol tartrate (LOPRESSOR) 25 MG tablet TAKE 3 TABLETS (75 MG TOTAL) BY MOUTH 2 (TWO) TIMES DAILY. 180 tablet 5  . TURMERIC PO  Take 1 tablet by mouth daily. Reported on 12/19/2015     No facility-administered medications prior to visit.     Allergies  Allergen Reactions  . Neomycin-Bacitracin Zn-Polymyx Rash    Polysporin- is tolerated   . Niacin Other (See Comments) and Cough    "cough til I threw up" (12/19/2012)  . Ciprofloxacin Hives    Got cipro and flagyl at same time, localized hives to IV arm  . Flagyl [Metronidazole] Hives    Got cipro and flagyl at same time, localized hives to IV arm    Review of Systems  Constitutional: Negative for fever and malaise/fatigue.  HENT: Negative for congestion.   Eyes: Negative for blurred vision.  Respiratory: Negative for shortness of breath.   Cardiovascular: Negative for chest pain, palpitations and leg swelling.  Gastrointestinal: Negative for abdominal pain, blood in stool and nausea.  Genitourinary: Negative for dysuria and frequency.  Musculoskeletal: Negative for falls.  Skin: Negative for rash.  Neurological: Positive for weakness. Negative for dizziness, loss of consciousness and headaches.  Endo/Heme/Allergies: Negative for environmental allergies.  Psychiatric/Behavioral: Positive for memory loss. Negative for depression. The patient is not nervous/anxious.        Objective:    Physical Exam Vitals signs and nursing note reviewed.  Constitutional:      General: She is not in acute distress.    Appearance: She is well-developed.  HENT:     Head: Normocephalic and atraumatic.     Nose: Nose normal.  Eyes:     General:  Right eye: No discharge.        Left eye: No discharge.  Neck:     Musculoskeletal: Normal range of motion and neck supple.  Cardiovascular:     Rate and Rhythm: Normal rate and regular rhythm.     Heart sounds: No murmur.  Pulmonary:     Effort: Pulmonary effort is normal.     Breath sounds: Normal breath sounds.  Abdominal:     General: Bowel sounds are normal.     Palpations: Abdomen is soft.     Tenderness:  There is no abdominal tenderness.  Skin:    General: Skin is warm and dry.  Neurological:     Mental Status: She is alert and oriented to person, place, and time.     BP (!) 160/80 (BP Location: Left Arm, Patient Position: Sitting, Cuff Size: Normal)   Pulse (!) 58   Temp (!) 97.2 F (36.2 C) (Temporal)   Resp 18   Wt 194 lb (88 kg)   SpO2 97%   BMI 35.48 kg/m  Wt Readings from Last 3 Encounters:  09/13/19 194 lb (88 kg)  08/02/19 187 lb (84.8 kg)  04/29/19 180 lb (81.6 kg)    Diabetic Foot Exam - Simple   No data filed     Lab Results  Component Value Date   WBC 6.5 08/02/2019   HGB 13.0 08/02/2019   HCT 39.3 08/02/2019   PLT 275.0 08/02/2019   GLUCOSE 100 (H) 08/02/2019   CHOL 155 08/02/2019   TRIG 125.0 08/02/2019   HDL 57.90 08/02/2019   LDLCALC 73 08/02/2019   ALT 21 08/02/2019   AST 18 08/02/2019   NA 135 08/02/2019   K 4.8 08/02/2019   CL 96 08/02/2019   CREATININE 0.76 08/02/2019   BUN 20 08/02/2019   CO2 32 08/02/2019   TSH 1.06 08/02/2019   INR 0.99 03/16/2015   HGBA1C 5.8 08/02/2019    Lab Results  Component Value Date   TSH 1.06 08/02/2019   Lab Results  Component Value Date   WBC 6.5 08/02/2019   HGB 13.0 08/02/2019   HCT 39.3 08/02/2019   MCV 88.9 08/02/2019   PLT 275.0 08/02/2019   Lab Results  Component Value Date   NA 135 08/02/2019   K 4.8 08/02/2019   CO2 32 08/02/2019   GLUCOSE 100 (H) 08/02/2019   BUN 20 08/02/2019   CREATININE 0.76 08/02/2019   BILITOT 0.3 08/02/2019   ALKPHOS 73 08/02/2019   AST 18 08/02/2019   ALT 21 08/02/2019   PROT 6.6 08/02/2019   ALBUMIN 4.5 08/02/2019   CALCIUM 8.9 08/02/2019   ANIONGAP 12 07/17/2019   GFR 74.19 08/02/2019   Lab Results  Component Value Date   CHOL 155 08/02/2019   Lab Results  Component Value Date   HDL 57.90 08/02/2019   Lab Results  Component Value Date   LDLCALC 73 08/02/2019   Lab Results  Component Value Date   TRIG 125.0 08/02/2019   Lab Results   Component Value Date   CHOLHDL 3 08/02/2019   Lab Results  Component Value Date   HGBA1C 5.8 08/02/2019       Assessment & Plan:   Problem List Items Addressed This Visit    Essential hypertension    Poorly controlled will alter medications, encouraged DASH diet, minimize caffeine and obtain adequate sleep. Report concerning symptoms and follow up as directed and as needed. Increase Metoprolol to 100 mg bid and monitor  Relevant Medications   metoprolol tartrate (LOPRESSOR) 100 MG tablet   Asthma, mild intermittent    No recent exacerbation      Vitamin B12 deficiency    Supplement and monitor      Mild cognitive impairment    Patient has noted for a couple of years but worsened recently. Notes some trouble with simple tasks lately. difficulty with gait. Referred to neurology for further consideration      Obesity    Encouraged DASH diet, decrease po intake and increase exercise as tolerated. Needs 7-8 hours of sleep nightly. Avoid trans fats, eat small, frequent meals every 4-5 hours with lean proteins, complex carbs and healthy fats. Minimize simple carbs, consider [employer] APP or return to [facility]      Vitamin D deficiency    Supplement and monitor       Other Visit Diagnoses    Memory loss    -  Primary   Relevant Orders   Ambulatory referral to Neurology      I have discontinued Zayna P. [facility]ford's TURMERIC PO, docusate sodium, and metoprolol tartrate. I am also having her start on metoprolol tartrate. Additionally, I am having her maintain her Vitamin D (Cholecalciferol), fluticasone, Multiple Vitamins-Minerals (MULTIVITAMIN WOMEN 50+ PO), KETOCONAZOLE (TOPICAL), levothyroxine, nitroGLYCERIN, hyoscyamine, azelastine, simvastatin, omeprazole, naproxen, losartan, hydrochlorothiazide, triamcinolone cream, and meclizine.  Meds ordered this encounter  Medications  . metoprolol tartrate (LOPRESSOR) 100 MG tablet    Sig: Take 1 tablet (100 mg total)  by mouth 2 (two) times daily.    Dispense:  60 tablet    Refill:  1     Penni Homans, MD

## 2019-09-15 NOTE — Assessment & Plan Note (Signed)
Patient has noted for a couple of years but worsened recently. Notes some trouble with simple tasks lately. difficulty with gait. Referred to neurology for further consideration

## 2019-09-17 ENCOUNTER — Other Ambulatory Visit: Payer: Self-pay

## 2019-09-17 ENCOUNTER — Ambulatory Visit: Payer: PPO | Admitting: Podiatry

## 2019-09-17 ENCOUNTER — Encounter: Payer: Self-pay | Admitting: Podiatry

## 2019-09-17 DIAGNOSIS — B351 Tinea unguium: Secondary | ICD-10-CM

## 2019-09-17 DIAGNOSIS — M79674 Pain in right toe(s): Secondary | ICD-10-CM

## 2019-09-17 DIAGNOSIS — M79675 Pain in left toe(s): Secondary | ICD-10-CM

## 2019-09-17 DIAGNOSIS — M79676 Pain in unspecified toe(s): Secondary | ICD-10-CM

## 2019-09-17 DIAGNOSIS — L84 Corns and callosities: Secondary | ICD-10-CM | POA: Diagnosis not present

## 2019-09-17 MED ORDER — NONFORMULARY OR COMPOUNDED ITEM: 3 refills | Status: DC

## 2019-09-17 NOTE — Patient Instructions (Signed)
Corns and Calluses Corns are small areas of thickened skin that occur on the top, sides, or tip of a toe. They contain a cone-shaped core with a point that can press on a nerve below. This causes pain.  Calluses are areas of thickened skin that can occur anywhere on the body, including the hands, fingers, palms, soles of the feet, and heels. Calluses are usually larger than corns. What are the causes? Corns and calluses are caused by rubbing (friction) or pressure, such as from shoes that are too tight or do not fit properly. What increases the risk? Corns are more likely to develop in people who have misshapen toes (toe deformities), such as hammer toes. Calluses can occur with friction to any area of the skin. They are more likely to develop in people who:  Work with their hands.  Wear shoes that fit poorly, are too tight, or are high-heeled.  Have toe deformities. What are the signs or symptoms? Symptoms of a corn or callus include:  A hard growth on the skin.  Pain or tenderness under the skin.  Redness and swelling.  Increased discomfort while wearing tight-fitting shoes, if your feet are affected. If a corn or callus becomes infected, symptoms may include:  Redness and swelling that gets worse.  Pain.  Fluid, blood, or pus draining from the corn or callus. How is this diagnosed? Corns and calluses may be diagnosed based on your symptoms, your medical history, and a physical exam. How is this treated? Treatment for corns and calluses may include:  Removing the cause of the friction or pressure. This may involve: ? Changing your shoes. ? Wearing shoe inserts (orthotics) or other protective layers in your shoes, such as a corn pad. ? Wearing gloves.  Applying medicine to the skin (topical medicine) to help soften skin in the hardened, thickened areas.  Removing layers of dead skin with a file to reduce the size of the corn or callus.  Removing the corn or callus with a  scalpel or laser.  Taking antibiotic medicines, if your corn or callus is infected.  Having surgery, if a toe deformity is the cause. Follow these instructions at home:   Take over-the-counter and prescription medicines only as told by your health care provider.  If you were prescribed an antibiotic, take it as told by your health care provider. Do not stop taking it even if your condition starts to improve.  Wear shoes that fit well. Avoid wearing high-heeled shoes and shoes that are too tight or too loose.  Wear any padding, protective layers, gloves, or orthotics as told by your health care provider.  Soak your hands or feet and then use a file or pumice stone to soften your corn or callus. Do this as told by your health care provider.  Check your corn or callus every day for symptoms of infection. Contact a health care provider if you:  Notice that your symptoms do not improve with treatment.  Have redness or swelling that gets worse.  Notice that your corn or callus becomes painful.  Have fluid, blood, or pus coming from your corn or callus.  Have new symptoms. Summary  Corns are small areas of thickened skin that occur on the top, sides, or tip of a toe.  Calluses are areas of thickened skin that can occur anywhere on the body, including the hands, fingers, palms, and soles of the feet. Calluses are usually larger than corns.  Corns and calluses are caused by   rubbing (friction) or pressure, such as from shoes that are too tight or do not fit properly.  Treatment may include wearing any padding, protective layers, gloves, or orthotics as told by your health care provider. This information is not intended to replace advice given to you by your health care provider. Make sure you discuss any questions you have with your health care provider. Document Released: 09/04/2004 Document Revised: 03/21/2019 Document Reviewed: 10/12/2017 Elsevier Patient Education  2020 [employer]  Inc[employer]   Onychomycosis/Fungal Toenails  WHAT IS IT? An infection that lies within the keratin of your nail plate that is caused by a fungus.  WHY ME? Fungal infections affect all ages, sexes, races, and creeds.  There may be many factors that predispose you to a fungal infection such as age, coexisting medical conditions such as diabetes, or an autoimmune disease; stress, medications, fatigue, genetics, etc.  Bottom line: fungus thrives in a warm, moist environment and your shoes offer such a location.  IS IT CONTAGIOUS? Theoretically, yes.  You do not want to share shoes, nail clippers or files with someone who has fungal toenails.  Walking around barefoot in the same room or sleeping in the same bed is unlikely to transfer the organism.  It is important to realize, however, that fungus can spread easily from one nail to the next on the same foot.  HOW DO WE TREAT THIS?  There are several ways to treat this condition.  Treatment may depend on many factors such as age, medications, pregnancy, liver and kidney conditions, etc.  It is best to ask your doctor which options are available to you.  1. No treatment.   Unlike many other medical concerns, you can live with this condition.  However for many people this can be a painful condition and may lead to ingrown toenails or a bacterial infection.  It is recommended that you keep the nails cut short to help reduce the amount of fungal nail. 2. Topical treatment.  These range from herbal remedies to prescription strength nail lacquers.  About 40-50% effective, topicals require twice daily application for approximately 9 to 12 months or until an entirely new nail has grown out.  The most effective topicals are medical grade medications available through physicians offices. 3. Oral antifungal medications.  With an 80-90% cure rate, the most common oral medication requires 3 to 4 months of therapy and stays in your system for a year as the new nail grows out.   Oral antifungal medications do require blood work to make sure it is a safe drug for you.  A liver function panel will be performed prior to starting the medication and after the first month of treatment.  It is important to have the blood work performed to avoid any harmful side effects.  In general, this medication safe but blood work is required. 4. Laser Therapy.  This treatment is performed by applying a specialized laser to the affected nail plate.  This therapy is noninvasive, fast, and non-painful.  It is not covered by insurance and is therefore, out of pocket.  The results have been very good with a 80-95% cure rate.  [facility] is the only practice in the area to offer this therapy. 5. Permanent Nail Avulsion.  Removing the entire nail so that a new nail will not grow back. 

## 2019-09-20 NOTE — Progress Notes (Signed)
Subjective: Jackie Stevens is seen today for follow up painful corns/calluses and elongated, thickened toenails 1-5 b/l feet that she cannot cut. Pain interferes with daily activities. Aggravating factor includes wearing enclosed shoe gear and relieved with periodic debridement.  She would like to discuss treatment for her mycotic toenails on today's visit.  Current Outpatient Medications on File Prior to Visit  Medication Sig  . azelastine (ASTELIN) 0.1 % nasal spray Place 2 sprays into both nostrils at bedtime as needed for rhinitis. Use in each nostril as directed  . famotidine (PEPCID) 20 MG tablet Take 40 mg by mouth daily.  . fluticasone (FLONASE) 50 MCG/ACT nasal spray Place 2 sprays into both nostrils daily.  Marland Kitchen FLUZONE HIGH-DOSE QUADRIVALENT 0.7 ML SUSY TO BE ADMINISTERED BY PHARMACIST FOR IMMUNIZATION  . hydrochlorothiazide (HYDRODIURIL) 25 MG tablet TAKE 1 TABLET BY MOUTH EVERY DAY  . hyoscyamine (LEVSIN SL) 0.125 MG SL tablet Place 1 tablet (0.125 mg total) under the tongue every 4 (four) hours as needed.  Marland Kitchen KETOCONAZOLE, TOPICAL, 1 % SHAM Apply 1 Dose topically once a week.  . levothyroxine (SYNTHROID, LEVOTHROID) 150 MCG tablet Take 150 mcg by mouth daily before breakfast.  . losartan (COZAAR) 100 MG tablet TAKE 1 TABLET BY MOUTH EVERY DAY  . meclizine (ANTIVERT) 25 MG tablet Take 1 tablet (25 mg total) by mouth 3 (three) times daily as needed for dizziness.  . metoprolol tartrate (LOPRESSOR) 100 MG tablet Take 1 tablet (100 mg total) by mouth 2 (two) times daily.  . mometasone (ELOCON) 0.1 % cream APPLY TOPICALLY DAILY AS NEEDED FOR ITCH  . Multiple Vitamins-Minerals (MULTIVITAMIN WOMEN 50+ PO) Take 1 tablet by mouth daily.  . naproxen (NAPROSYN) 375 MG tablet TAKE 1 TABLET (375 MG TOTAL) BY MOUTH 2 (TWO) TIMES DAILY WITH A MEAL.  . nitroGLYCERIN (NITROSTAT) 0.4 MG SL tablet PLACE 1 TABLET UNDER THE TONGUE EVERY 5 MINUTES AS NEEDED FOR CHEST PAIN  . omeprazole (PRILOSEC) 40 MG  capsule TAKE 1 CAPSULE BY MOUTH TWICE A DAY  . prednisoLONE acetate (PRED FORTE) 1 % ophthalmic suspension PLACE 4 DROPS IN BOTH EARS TWICE DAILY AS NEEDED FOR ITCHING  . simvastatin (ZOCOR) 40 MG tablet TAKE 1 TABLET BY MOUTH EVERYDAY AT BEDTIME  . triamcinolone cream (KENALOG) 0.1 % Apply 1 application topically 2 (two) times daily.  . Vitamin D, Cholecalciferol, 1000 UNITS CAPS Take 1 capsule by mouth daily.   No current facility-administered medications on file prior to visit.      Allergies  Allergen Reactions  . Neomycin-Bacitracin Zn-Polymyx Rash    Polysporin- is tolerated   . Niacin Other (See Comments) and Cough    "cough til I threw up" (12/19/2012)  . Ciprofloxacin Hives    Got cipro and flagyl at same time, localized hives to IV arm  . Flagyl [Metronidazole] Hives    Got cipro and flagyl at same time, localized hives to IV arm   Objective:  Vascular Examination: Capillary refill time <3 seconds x 10 digits.  Dorsalis pedis present b/l.  Posterior tibial pulses present b/l.  Digital hair present b/l .  Skin temperature gradient WNL b/l.   Dermatological Examination: Skin with normal turgor, texture and tone b/l.  No open wounds b/l.  Toenails 1-5 b/l discolored, thick, dystrophic with subungual debris and pain with palpation to nailbeds due to thickness of nails.  Porokeratotic lesions submet head 2 right foot with tenderness to palpation. No erythema, no edema, no drainage, no flocculence.  Hyperkeratotic lesion  lateral 4th digit left foot, medial DIPJ right 2nd digit. No erythema, no edema, no drainage, no flocculence noted.  Musculoskeletal: Muscle strength 5/5 to all LE muscle groups b/l.  HAV with bunion b/l. Overlapping HT 2nd digit b/l.  No pain, crepitus or joint limitation noted with ROM.   Neurological Examination: Protective sensation intact with 10 gram monofilament bilaterally.  Epicritic sensation present bilaterally.  Vibratory sensation  intact bilaterally.   Assessment: Painful onychomycosis toenails 1-5 b/l  Porokeratotic lesion submet head 2 right foot Corns left 4th, right 2nd digit  Plan: 1. Toenails 1-5 b/l were debrided in length and girth without iatrogenic bleeding. Discussed treatment for onychomycosis and she would like to try topical medication. Rx written for nonformulary compounding topical antifungal: [facility]: Antifungal cream - Terbinafine 3%, Fluconazole 2%, Tea Tree Oil 5%, Urea 10%, Ibuprofen 2% in DMSO Suspension #17ml. Apply to the affected nail(s) at bedtime. 2. Hyperkeratotic lesions pared submet head 2nd right, left 4th and right 2nd digit utilizing sterile scalpel blade without incident. 3. Patient to continue soft, supportive shoe gear daily 4. Patient to report any pedal injuries to medical professional immediately. 5. Follow up 3 months.  6. Patient/POA to call should there be a concern in the interim.

## 2019-09-25 ENCOUNTER — Other Ambulatory Visit: Payer: Self-pay | Admitting: Family Medicine

## 2019-10-05 DIAGNOSIS — M1712 Unilateral primary osteoarthritis, left knee: Secondary | ICD-10-CM | POA: Diagnosis not present

## 2019-10-05 DIAGNOSIS — M170 Bilateral primary osteoarthritis of knee: Secondary | ICD-10-CM | POA: Diagnosis not present

## 2019-10-05 DIAGNOSIS — M1711 Unilateral primary osteoarthritis, right knee: Secondary | ICD-10-CM | POA: Diagnosis not present

## 2019-10-09 ENCOUNTER — Other Ambulatory Visit: Payer: Self-pay | Admitting: Family Medicine

## 2019-10-09 ENCOUNTER — Other Ambulatory Visit: Payer: Self-pay | Admitting: Medical

## 2019-10-09 DIAGNOSIS — J302 Other seasonal allergic rhinitis: Secondary | ICD-10-CM

## 2019-10-09 DIAGNOSIS — K219 Gastro-esophageal reflux disease without esophagitis: Secondary | ICD-10-CM

## 2019-10-09 DIAGNOSIS — R059 Cough, unspecified: Secondary | ICD-10-CM

## 2019-10-09 DIAGNOSIS — R05 Cough: Secondary | ICD-10-CM

## 2019-10-12 DIAGNOSIS — G4733 Obstructive sleep apnea (adult) (pediatric): Secondary | ICD-10-CM | POA: Diagnosis not present

## 2019-10-18 DIAGNOSIS — G4733 Obstructive sleep apnea (adult) (pediatric): Secondary | ICD-10-CM | POA: Diagnosis not present

## 2019-11-12 DIAGNOSIS — G4733 Obstructive sleep apnea (adult) (pediatric): Secondary | ICD-10-CM | POA: Diagnosis not present

## 2019-11-19 DIAGNOSIS — G4733 Obstructive sleep apnea (adult) (pediatric): Secondary | ICD-10-CM | POA: Diagnosis not present

## 2019-11-26 ENCOUNTER — Other Ambulatory Visit: Payer: Self-pay

## 2019-11-26 ENCOUNTER — Encounter: Payer: Self-pay | Admitting: Neurology

## 2019-11-26 ENCOUNTER — Other Ambulatory Visit (INDEPENDENT_AMBULATORY_CARE_PROVIDER_SITE_OTHER): Payer: PPO

## 2019-11-26 ENCOUNTER — Encounter: Payer: Self-pay | Admitting: Family Medicine

## 2019-11-26 ENCOUNTER — Ambulatory Visit (INDEPENDENT_AMBULATORY_CARE_PROVIDER_SITE_OTHER): Payer: PPO | Admitting: Neurology

## 2019-11-26 VITALS — BP 196/73 | HR 63 | Ht 63.0 in | Wt 204.0 lb

## 2019-11-26 DIAGNOSIS — R413 Other amnesia: Secondary | ICD-10-CM | POA: Diagnosis not present

## 2019-11-26 NOTE — Progress Notes (Signed)
NEUROLOGY CONSULTATION NOTE  REGENE EAPEN MRN: [id number] DOB: 1946-06-26  Referring provider: Dr. Penni Homans Primary care provider: Dr. Penni Homans  Reason for consult:  Memory loss  Dear Dr Charlett Blake:  Thank you for your kind referral of Jackie Stevens for consultation of the above symptoms. Although her history is well known to you, please allow me to reiterate it for the purpose of our medical record. The patient was accompanied to the clinic by her husband who also provides collateral information. Records and images were personally reviewed where available.   HISTORY OF PRESENT ILLNESS: This is a pleasant 73 year old right-handed woman with a history of hypertension, hyperlipidemia, thyroid cancer s/p partial thyroidectomy, presenting for evaluation of memory loss. She does not think her memory is good, there are certain things she cannot remember. She looks to her husband for answers several times during the visit. She lives with her husband and son. She manages medications without difficulties and denies getting lost driving. She became concerned about her memory because she had always used to do their checkbook but in February, something was not right. She could not figure it out, and stopped doing it since then. She states she is not even sure she can do it now. Her husband does not think there is anything significantly concerning about her memory. He reports the checkbook issue "threw her off." She gets flustered when she does not remember things. He has not noticed any personality changes but they both note that she is more paranoid, closing the blinds at night. They have lived in the same house for 37 years but for the past 6 months, she feels like someone is always looking in their house and will shoot at them, "I don't know why." She and her husband deny any hallucinations. She has always been worried about things, and is anxious today. She states mood is "I don't know," she is  afraid to do some things but thinks mood is alright. She notes some depression due to inability to travel like before. Her mother had Alzheimer's disease. No history of significant head injuries. She rarely drinks alcohol.   She denies any headaches, diplopia, dysarthria/dysphagia, neck/back pain, focal numbness/tingling/weakness, bowel/bladder dysfunction, tremors. She had a lot of dizziness for a time where she would feel lightheaded and have to hold on. I personally reviewed head CT without contrast done in August 2020 for dizziness which did not show any acute changes, there was moderate diffuse atrophy. She had an infection prior to the start of Covid-19 where she lost her sense of taste and smell, it has not come back. She usually gets 7 hours of sleep with her CPAP machine, waking up at 4am. She is occasionally drowsy in the day.    Laboratory Data: Lab Results  Component Value Date   TSH 1.06 08/02/2019   Lab Results  Component Value Date   [id number] 11/07/2018     PAST MEDICAL HISTORY: Past Medical History:  Diagnosis Date  . Abnormal cervical cytology 10/25/2012   Follows with Dr Leavy Cella of Gyn  . Allergic rhinitis   . Anemia 10/06/2013  . Anginal pain ([place])    pt has history of CP states had cardiac workup with no specific issues identified   . Anxiety    when increased stress   . Arthritis    "knees; right thumb; shoulders" (12/19/2012)  . Asthma   . Chest pain   . Chicken pox as a child  .  Complication of anesthesia   . Constipation   . Decreased hearing   . Dermatitis 03/06/2017  . Diverticulitis 10/25/2012   pt. reports that a drain was placed - 09/2012    . Dry mouth   . Excessive thirst   . External hemorrhoid, bleeding    "sometimes" (January 15, 2013)  . Frequent urination   . GERD (gastroesophageal reflux disease)   . H/O hiatal hernia   . Hand tingling   . Heart murmur   . HTN (hypertension)    stress test completed by Anselm Lis, diagnosed as  GERD  . Hyperlipidemia   . Hypothyroidism   . Incontinence   . Infertility, female   . Insomnia   . Joint pain   . Kidney stones 1970's   "passed on their own" (01-15-13)  . Knee pain   . Left shoulder pain 09/14/2016  . Low back pain 06/09/2014  . Measles as a child  . Medicare annual wellness visit, subsequent 10/06/2013   Steinhoffer of Dermatology Pneumovax in 2012   . Memory difficulties 09/05/2017  . Obesity 09/11/2017  . OSA on CPAP   . Palpitations   . Paresthesia 03/23/2015   Left face  . PONV (postoperative nausea and vomiting)   . Preventative health care 10/06/2013   [facility] of Dermat[facility] Pneumovax in 2012  . Preventative health care 02/26/2016  . Rheumatoid arteritis ([place])   . Shortness of breath dyspnea    using stairs  . Sinus pain   . Sleep apnea   . Swelling of both lower extremities   . Thyroid cancer ([place]) 1980's  . Thyroid disease   . Tinnitus   . UTI (urinary tract infection) 04/02/2013    PAST SURGICAL HISTORY: Past Surgical History:  Procedure Laterality Date  . CHOLECYSTECTOMY  1990  . COLON SURGERY    . COLOSTOMY REVISION  January 15, 2013   Procedure: COLON RESECTION SIGMOID;  Surgeon: Gwenyth Ober, MD;  Location: [facility];  Service: General;  Laterality: N/A;  . CYSTOSCOPY WITH STENT PLACEMENT  Jan 15, 2013   Procedure: CYSTOSCOPY WITH STENT PLACEMENT;  Surgeon: Hanley Ben, MD;  Location: [facility];  Service: Urology;  Laterality: N/A;  . DILATION AND CURETTAGE OF UTERUS  1960's   "lots of them; had miscarriages" (01/15/2013)  . LYSIS OF ADHESION N/A 12/02/2015   Procedure: LAPAROSCOPIC LYSIS OF ADHESION;  Surgeon: Johnathan Hausen, MD;  Location: [facility];  Service: General;  Laterality: N/A;  . ROBOTIC ASSISTED BILATERAL SALPINGO OOPHERECTOMY Bilateral 12/02/2015   Procedure: XI ROBOTIC ASSISTED BILATERAL SALPINGO OOPHORECTOMY;  Surgeon: Everitt Amber, MD;  Location: [facility];  Service: Gynecology;  Laterality: Bilateral;  . SIGMOID RESECTION / RECTOPEXY   01-15-2013  . THYROIDECTOMY, PARTIAL  1988   "then did iodine to remove the rest" (2013/01/15)  . TONSILLECTOMY  1951?  . TRANSRECTAL DRAINAGE OF PELVIC ABSCESS  10/27/2012  . VAGINAL HYSTERECTOMY  1970's   "still have my ovaries" (01/15/13)    MEDICATIONS: Current Outpatient Medications on File Prior to Visit  Medication Sig Dispense Refill  . azelastine (ASTELIN) 0.1 % nasal spray PLACE 2 SPRAYS INTO BOTH NOSTRILS 2 (TWO) TIMES DAILY. 30 mL 1  . famotidine (PEPCID) 20 MG tablet Take 40 mg by mouth daily.    . hydrochlorothiazide (HYDRODIURIL) 25 MG tablet TAKE 1 TABLET BY MOUTH EVERY DAY 90 tablet 1  . hyoscyamine (LEVSIN SL) 0.125 MG SL tablet Place 1 tablet (0.125 mg total) under the tongue every 4 (four) hours as needed. 30 tablet 1  .  KETOCONAZOLE, TOPICAL, 1 % SHAM Apply 1 Dose topically once a week. 200 mL 1  . levothyroxine (SYNTHROID, LEVOTHROID) 150 MCG tablet Take 150 mcg by mouth daily before breakfast.    . losartan (COZAAR) 100 MG tablet TAKE 1 TABLET BY MOUTH EVERY DAY 90 tablet 1  . meclizine (ANTIVERT) 25 MG tablet Take 1 tablet (25 mg total) by mouth 3 (three) times daily as needed for dizziness. 30 tablet 1  . metoprolol tartrate (LOPRESSOR) 100 MG tablet Take 1 tablet (100 mg total) by mouth 2 (two) times daily. 60 tablet 1  . Multiple Vitamins-Minerals (MULTIVITAMIN WOMEN 50+ PO) Take 1 tablet by mouth daily.    . naproxen (NAPROSYN) 375 MG tablet TAKE 1 TABLET (375 MG TOTAL) BY MOUTH 2 (TWO) TIMES DAILY WITH A MEAL. 180 tablet 1  . nitroGLYCERIN (NITROSTAT) 0.4 MG SL tablet PLACE 1 TABLET UNDER THE TONGUE EVERY 5 MINUTES AS NEEDED FOR CHEST PAIN 25 tablet 1  . NONFORMULARY OR COMPOUNDED ITEM Antifungal solution: Terbinafine 3%, Fluconazole 2%, Tea Tree Oil 5%, Urea 10%, Ibuprofen 2% in DMSO suspension #52mL 1 each 3  . omeprazole (PRILOSEC) 40 MG capsule TAKE 1 CAPSULE BY MOUTH TWICE A DAY 180 capsule 1  . prednisoLONE acetate (PRED FORTE) 1 % ophthalmic suspension PLACE  4 DROPS IN BOTH EARS TWICE DAILY AS NEEDED FOR ITCHING    . simvastatin (ZOCOR) 40 MG tablet TAKE 1 TABLET BY MOUTH EVERYDAY AT BEDTIME 90 tablet 1  . Vitamin D, Cholecalciferol, 1000 UNITS CAPS Take 1 capsule by mouth daily.     No current facility-administered medications on file prior to visit.    ALLERGIES: Allergies  Allergen Reactions  . Neomycin-Bacitracin Zn-Polymyx Rash    Polysporin- is tolerated   . Niacin Other (See Comments) and Cough    "cough til I threw up" (12/19/2012)  . Ciprofloxacin Hives    Got cipro and flagyl at same time, localized hives to IV arm  . Flagyl [Metronidazole] Hives    Got cipro and flagyl at same time, localized hives to IV arm    FAMILY HISTORY: Family History  Problem Relation Age of Onset  . Heart disease Father   . Pneumonia Father   . Hypertension Father   . Hyperlipidemia Father   . Cancer Father        skin  . Stroke Father   . Alzheimer's disease Mother   . Heart disease Mother   . Depression Mother   . Emphysema Brother        marijuana and cigarettes  . Alcohol abuse Brother   . Hearing loss Brother   . Diabetes Maternal Grandmother   . Alzheimer's disease Paternal Grandmother   . Cancer Paternal Grandmother        lung?- smoker  . Hyperlipidemia Paternal Grandmother   . Heart attack Paternal Grandfather   . Alcohol abuse Paternal Grandfather   . Neurofibromatosis Son        schwanomatosis  . Neurofibromatosis Son        swanomatosis  . Cancer Paternal Aunt     SOCIAL HISTORY: Social History   Socioeconomic History  . Marital status: Married    Spouse name: [name]  . Number of children: 2  . Years of education: Not on file  . Highest education level: Not on file  Occupational History  . Occupation: Retired  Tobacco Use  . Smoking status: Never Smoker  . Smokeless tobacco: Never Used  Substance and Sexual Activity  .  Alcohol use: No    Alcohol/week: 0.0 standard drinks  . Drug use: No  . Sexual activity:  Not Currently  Other Topics Concern  . Not on file  Social History Narrative   Married    Children   Right handed   Some college   Social Determinants of Health   Financial Resource Strain:   . Difficulty of Paying Living Expenses: Not on file  Food Insecurity:   . Worried About Charity fundraiser in the Last Year: Not on file  . Ran Out of Food in the Last Year: Not on file  Transportation Needs:   . Lack of Transportation (Medical): Not on file  . Lack of Transportation (Non-Medical): Not on file  Physical Activity:   . Days of Exercise per Week: Not on file  . Minutes of Exercise per Session: Not on file  Stress:   . Feeling of Stress : Not on file  Social Connections:   . Frequency of Communication with Friends and Family: Not on file  . Frequency of Social Gatherings with Friends and Family: Not on file  . Attends Religious Services: Not on file  . Active Member of Clubs or Organizations: Not on file  . Attends Archivist Meetings: Not on file  . Marital Status: Not on file  Intimate Partner Violence:   . Fear of Current or Ex-Partner: Not on file  . Emotionally Abused: Not on file  . Physically Abused: Not on file  . Sexually Abused: Not on file    REVIEW OF SYSTEMS: Constitutional: No fevers, chills, or sweats, no generalized fatigue, change in appetite Eyes: No visual changes, double vision, eye pain Ear, nose and throat: No hearing loss, ear pain, nasal congestion, sore throat Cardiovascular: No chest pain, palpitations Respiratory:  No shortness of breath at rest or with exertion, wheezes GastrointestinaI: No nausea, vomiting, diarrhea, abdominal pain, fecal incontinence Genitourinary:  No dysuria, urinary retention or frequency Musculoskeletal:  No neck pain, back pain Integumentary: No rash, pruritus, skin lesions Neurological: as above Psychiatric: No depression, insomnia, anxiety Endocrine: No palpitations, fatigue, diaphoresis, mood swings,  change in appetite, change in weight, increased thirst Hematologic/Lymphatic:  No anemia, purpura, petechiae. Allergic/Immunologic: no itchy/runny eyes, nasal congestion, recent allergic reactions, rashes  PHYSICAL EXAM: Vitals:   11/26/19 1400  BP: (!) 196/73  Pulse: 63  SpO2: 98%   General: No acute distress Head:  Normocephalic/atraumatic Skin/Extremities: No rash, no edema Neurological Exam: Mental status: alert and oriented to person, place, and time, no dysarthria or aphasia, Fund of knowledge is appropriate.  Recent and remote memory are intact.  Attention and concentration are reduced. SLUMS score 20/30 [employer] Mental Exam 11/26/2019  Weekday Correct 1  Current year 1  What state are we in? 1  Amount spent 1  Amount left 0  # of Animals 1  5 objects recall 3  Number series 2  Hour markers 2  Time correct 0  Placed X in triangle correctly 1  Largest Figure 1  Name of female 2  Date back to work 0  Type of work 2  State she lived in 2  Total score 20    Cranial nerves: CN I: not tested CN II: pupils equal, round and reactive to light, visual fields intact, fundi unremarkable. CN III, IV, VI:  full range of motion, no nystagmus, no ptosis CN V: facial sensation intact CN VII: upper and lower face symmetric CN VIII: hearing intact to finger rub  CN IX, X: gag intact, uvula midline CN XI: sternocleidomastoid and trapezius muscles intact CN XII: tongue midline Bulk & Tone: normal, no fasciculations. Motor: 5/5 throughout with no pronator drift. Sensation: intact to light touch, cold, pin, vibration and joint position sense.  No extinction to double simultaneous stimulation.  Romberg test negative Deep Tendon Reflexes: +2 on right UE, +1 left UE and both LE. Cerebellar: no incoordination on finger to nose testing Gait: slow and cautious favoring left knee due to pain Tremor: none  IMPRESSION: This is a pleasant 73 year old right-handed woman with a  history of hypertension, hyperlipidemia, thyroid cancer s/p partial thyroidectomy, presenting for evaluation of memory loss. Neurological exam today is non-focal, she favors the left knee due to pain. SLUMS score 20/30. She endorses more difficulty managing finances, which has been making her more anxious. She also endorses some paranoia. We discussed different causes of memory loss, repeat B12 level will be ordered. MRI brain without contrast will be ordered to assess for underlying structural abnormality. She will be scheduled for Neurocognitive evaluation to further assess cognitive changes and potential contribution of anxiety/stress. We discussed the importance of control of vascular risk factors, physical exercise, and brain stimulation exercises for brain health. Follow-up in 6 months, they know to call for any changes.   Thank you for allowing me to participate in the care of this patient. Please do not hesitate to call for any questions or concerns.   Ellouise Newer, M.D.  CC: Dr. Charlett Blake

## 2019-11-26 NOTE — Patient Instructions (Addendum)
1. Bloodwork for B12  2. Schedule MRI brain without contrast. [facility] will call you to schedule this. If needed their number is [phone].  3. Schedule Neurocognitive testing with Dr. Melvyn Novas  4. Follow-up in 6 months, call for any changes  You have been referred for a neurocognitive evaluation in our office.   The evaluation has two parts.   . The first part of the evaluation is a clinical interview with the neuropsychologist (Dr. Melvyn Novas or Dr. Nicole Kindred). Please bring someone with you to this appointment if possible, as it is helpful for the doctor to hear from both you and another adult who knows you well.   . The second part of the evaluation is testing with the doctor's technician Hinton Dyer or Maudie Mercury). The testing includes a variety of tasks- mostly question-and-answer, some paper-and-pencil. There is nothing you need to do to prepare for this appointment, but having a good night's sleep prior to the testing, taking medications as you normally would, and bringing eyeglasses and hearing aids (if you wear them), is advised. Please make sure that you wear a mask to the appointment.  Please note: We have to reserve several hours of the neuropsychologist's time and the psychometrician's time for your evaluation appointment. As such, please note that there is a No-Show fee of $100. If you are unable to attend any of your appointments, please contact our office as soon as possible to reschedule.   RECOMMENDATIONS FOR ALL PATIENTS WITH MEMORY PROBLEMS: 1. Continue to exercise (Recommend 30 minutes of walking everyday, or 3 hours every week) 2. Increase social interactions - continue going to [place] and enjoy social gatherings with friends and family 3. Eat healthy, avoid fried foods and eat more fruits and vegetables 4. Maintain adequate blood pressure, blood sugar, and blood cholesterol level. Reducing the risk of stroke and cardiovascular disease also helps promoting better memory. 5. Avoid  stressful situations. Live a simple life and avoid aggravations. Organize your time and prepare for the next day in anticipation. 6. Sleep well, avoid any interruptions of sleep and avoid any distractions in the bedroom that may interfere with adequate sleep quality 7. Avoid sugar, avoid sweets as there is a strong link between excessive sugar intake, diabetes, and cognitive impairment We discussed the Mediterranean diet, which has been shown to help patients reduce the risk of progressive memory disorders and reduces cardiovascular risk. This includes eating fish, eat fruits and green leafy vegetables, nuts like almonds and hazelnuts, walnuts, and also use olive oil. Avoid fast foods and fried foods as much as possible. Avoid sweets and sugar as sugar use has been linked to worsening of memory function.

## 2019-11-27 LAB — VITAMIN B12: Vitamin B-12: 622 pg/mL (ref 200–1100)

## 2019-11-28 ENCOUNTER — Telehealth: Payer: Self-pay

## 2019-11-28 NOTE — Telephone Encounter (Signed)
-----   Message from Cameron Sprang, MD sent at 11/27/2019  1:59 PM EST ----- Pls let her know B12 level normal, thanks

## 2019-11-28 NOTE — Telephone Encounter (Signed)
Pt informed of B12 results 

## 2019-12-06 ENCOUNTER — Other Ambulatory Visit: Payer: Self-pay | Admitting: Medical

## 2019-12-12 DIAGNOSIS — G4733 Obstructive sleep apnea (adult) (pediatric): Secondary | ICD-10-CM | POA: Diagnosis not present

## 2019-12-17 ENCOUNTER — Encounter: Payer: Self-pay | Admitting: Family Medicine

## 2019-12-17 ENCOUNTER — Ambulatory Visit (INDEPENDENT_AMBULATORY_CARE_PROVIDER_SITE_OTHER): Payer: Medicare HMO | Admitting: Family Medicine

## 2019-12-17 ENCOUNTER — Other Ambulatory Visit: Payer: Self-pay

## 2019-12-17 VITALS — BP 180/68 | HR 65 | Temp 98.5°F | Resp 18 | Wt 203.2 lb

## 2019-12-17 DIAGNOSIS — R739 Hyperglycemia, unspecified: Secondary | ICD-10-CM | POA: Diagnosis not present

## 2019-12-17 DIAGNOSIS — E538 Deficiency of other specified B group vitamins: Secondary | ICD-10-CM

## 2019-12-17 DIAGNOSIS — H6690 Otitis media, unspecified, unspecified ear: Secondary | ICD-10-CM

## 2019-12-17 DIAGNOSIS — I10 Essential (primary) hypertension: Secondary | ICD-10-CM | POA: Diagnosis not present

## 2019-12-17 DIAGNOSIS — E782 Mixed hyperlipidemia: Secondary | ICD-10-CM | POA: Diagnosis not present

## 2019-12-17 DIAGNOSIS — E039 Hypothyroidism, unspecified: Secondary | ICD-10-CM | POA: Diagnosis not present

## 2019-12-17 DIAGNOSIS — E559 Vitamin D deficiency, unspecified: Secondary | ICD-10-CM | POA: Diagnosis not present

## 2019-12-17 DIAGNOSIS — G3184 Mild cognitive impairment, so stated: Secondary | ICD-10-CM | POA: Diagnosis not present

## 2019-12-17 MED ORDER — METOPROLOL TARTRATE 100 MG PO TABS: 150.0000 mg | ORAL_TABLET | Freq: Two times a day (BID) | ORAL | 2 refills | Status: DC

## 2019-12-17 MED ORDER — CEFDINIR 300 MG PO CAPS: 300.0000 mg | ORAL_CAPSULE | Freq: Two times a day (BID) | ORAL | 0 refills | Status: AC

## 2019-12-17 MED ORDER — LOSARTAN POTASSIUM 100 MG PO TABS: 100.0000 mg | ORAL_TABLET | Freq: Every day | ORAL | 1 refills | Status: DC

## 2019-12-17 NOTE — Patient Instructions (Addendum)
Biotin, Zinc 30-50 mg and fish or krill or flaxseed oil caps for the nails  Try hydrogen peroxide in ears after shower once or twice a week. Roughly 3-5 drops.  Try a couple drops of Mineral oil in ears once or twice a week   Learn something new  Check vitals weekly, blood pressure, pulse, temperature and oxygen and write it down   Metoprolol tabs should be 100 mg each now no 25 mg tabs when pick up next prescription. Increase dose from 100 mg twice a day to 150 mg twice a day. (1.5 of the 100 mg tabs twice a day) Dementia Dementia is a condition that affects the way the brain functions. It often affects memory and thinking. Usually, dementia gets worse with time and cannot be reversed (progressive dementia). There are many types of dementia, including:  Alzheimer's disease. This type is the most common.  Vascular dementia. This type may happen as the result of a stroke.  Lewy body dementia. This type may happen to people who have Parkinson's disease.  Frontotemporal dementia. This type is caused by damage to nerve cells (neurons) in certain parts of the brain. Some people may be affected by more than one type of dementia. This is called mixed dementia. What are the causes? Dementia is caused by damage to cells in the brain. The area of the brain and the types of cells damaged determine the type of dementia. Usually, this damage is irreversible or cannot be undone. Some examples of irreversible causes include:  Conditions that affect the blood vessels of the brain, such as diabetes, heart disease, or blood vessel disease.  Genetic mutations. In some cases, changes in the brain may be caused by another condition and can be reversed or slowed. Some examples of reversible causes include:  Injury to the brain.  Certain medicines.  Infection, such as meningitis.  Metabolic problems, such as vitamin B12 deficiency or thyroid disease.  Pressure on the brain, such as from a tumor or  blood clot. What are the signs or symptoms? Symptoms of dementia depend on the type of dementia. Common signs of dementia include problems with remembering, thinking, problem solving, decision making, and communicating. These signs develop slowly or get worse with time. This may include:  Problems remembering things.  Having trouble taking a bath or putting clothes on.  Forgetting appointments.  Forgetting to pay bills.  Difficulty planning and preparing meals.  Having trouble speaking.  Getting lost easily. How is this diagnosed? This condition is diagnosed by a specialist (neurologist). It is diagnosed based on the history of your symptoms, your medical history, a physical exam, and tests. Tests may include:  Tests to evaluate brain function, such as memory tests, cognitive tests, and other tests.  Lab tests, such as blood or urine tests.  Imaging tests, such as a CT scan, a PET scan, or an MRI.  Genetic testing. This may be done if other family members have a diagnosis of certain types of dementia. Your health care provider will talk with you and your family, friends, or caregivers about your history and symptoms. How is this treated?  Treatment for this condition depends on the cause of the dementia. Progressive dementias, such as Alzheimer's disease, cannot be cured, but there may be treatments that help to manage symptoms. Treatment might involve taking medicines that may help to:  Control the dementia.  Slow down the progression of the dementia.  Manage symptoms. In some cases, treating the cause of your  dementia can improve symptoms, reverse symptoms, or slow down how quickly your dementia becomes worse. Your health care provider can direct you to support groups, organizations, and other health care providers who can help with decisions about your care. Follow these instructions at home: Medicines  Take over-the-counter and prescription medicines only as told by  your health care provider.  Use a pill organizer or pill reminder to help you manage your medicines.  Avoid taking medicines that can affect thinking, such as pain medicines or sleeping medicines. Lifestyle  Make healthy lifestyle choices. ? Be physically active as told by your health care provider. ? Do not use any products that contain nicotine or tobacco, such as cigarettes, e-cigarettes, and chewing tobacco. If you need help quitting, ask your health care provider. ? Do not drink alcohol. ? Practice stress-management techniques when you get stressed. ? Spend time with other people.  Make sure to get quality sleep. These tips can help you get a good night's rest: ? Avoid napping during the day. ? Keep your sleeping area dark and cool. ? Avoid exercising during the few hours before you go to bed. ? Avoid caffeine products in the evening. Eating and drinking  Drink enough fluid to keep your urine pale yellow.  Eat a healthy diet. General instructions   Work with your health care provider to determine what you need help with and what your safety needs are.  Talk with your health care provider about whether it is safe for you to drive.  If you were given a bracelet that identifies you as a person with memory loss or tracks your location, make sure to wear it at all times.  Work with your family to make important decisions, such as advance directives, medical power of attorney, or a living will.  Keep all follow-up visits as told by your health care provider. This is important. Where to find more information  Alzheimer's Association: CapitalMile.co.nz  National Institute on Aging: DVDEnthusiasts.nl  World Health Organization: RoleLink.com.br Contact a health care provider if:  You have any new or worsening symptoms.  You have problems with choking or swallowing. Get help right away if:  You feel depressed or sad, or feel that you want to harm yourself.  Your family  members become concerned for your safety. If you ever feel like you may hurt yourself or others, or have thoughts about taking your own life, get help right away. You can go to your nearest emergency department or call:  Your local emergency services (911 in the [place]  A suicide crisis helpline, such as the [place] at 267-142-6975. This is open 24 hours a day. Summary  Dementia is a condition that affects the way the brain functions. Dementia often affects memory and thinking.  Usually, dementia gets worse with time and cannot be reversed (progressive dementia).  Treatment for this condition depends on the cause of the dementia.  Work with your health care provider to determine what you need help with and what your safety needs are.  Your health care provider can direct you to support groups, organizations, and other health care providers who can help with decisions about your care. This information is not intended to replace advice given to you by your health care provider. Make sure you discuss any questions you have with your health care provider. Document Revised: 02/13/2019 Document Reviewed: 02/13/2019 Elsevier Patient Education  [place].

## 2019-12-19 ENCOUNTER — Encounter: Payer: PPO | Admitting: Psychology

## 2019-12-19 ENCOUNTER — Ambulatory Visit: Payer: Medicare HMO | Admitting: Podiatry

## 2019-12-19 ENCOUNTER — Encounter: Payer: Self-pay | Admitting: Podiatry

## 2019-12-19 ENCOUNTER — Other Ambulatory Visit: Payer: Self-pay

## 2019-12-19 DIAGNOSIS — B351 Tinea unguium: Secondary | ICD-10-CM | POA: Diagnosis not present

## 2019-12-19 DIAGNOSIS — M79672 Pain in left foot: Secondary | ICD-10-CM | POA: Diagnosis not present

## 2019-12-19 DIAGNOSIS — M79676 Pain in unspecified toe(s): Secondary | ICD-10-CM

## 2019-12-19 DIAGNOSIS — Q828 Other specified congenital malformations of skin: Secondary | ICD-10-CM

## 2019-12-19 DIAGNOSIS — L84 Corns and callosities: Secondary | ICD-10-CM

## 2019-12-19 DIAGNOSIS — M79671 Pain in right foot: Secondary | ICD-10-CM

## 2019-12-19 NOTE — Progress Notes (Signed)
Subjective:    Patient ID: Jackie Stevens, female    DOB: 01/31/1946, 74 y.o.   MRN: [id number]  No chief complaint on file.   HPI Patient is in today for follow up on chronic medical concerns including hypertension, hyperlipidemia, hypothyroidism and more. No recent febrile illness or hospitalizations. She is working with neurology now and acknowledges her memory is worsening. She is having trouble with her ears being itchy and burning. Topical treatments help some. Denies CP/palp/SOB/HA/congestion/fevers/GI or GU c/o. Taking meds as prescribed  Past Medical History:  Diagnosis Date  . Abnormal cervical cytology 10/25/2012   Follows with Dr Leavy Cella of Gyn  . Allergic rhinitis   . Anemia 10/06/2013  . Anginal pain ([place])    pt has history of CP states had cardiac workup with no specific issues identified   . Anxiety    when increased stress   . Arthritis    "knees; right thumb; shoulders" (01/02/2013)  . Asthma   . Chest pain   . Chicken pox as a child  . Complication of anesthesia   . Constipation   . Decreased hearing   . Dermatitis 03/06/2017  . Diverticulitis 10/25/2012   pt. reports that a drain was placed - 09/2012    . Dry mouth   . Excessive thirst   . External hemorrhoid, bleeding    "sometimes" (Jan 02, 2013)  . Frequent urination   . GERD (gastroesophageal reflux disease)   . H/O hiatal hernia   . Hand tingling   . Heart murmur   . HTN (hypertension)    stress test completed by Anselm Lis, diagnosed as GERD  . Hyperlipidemia   . Hypothyroidism   . Incontinence   . Infertility, female   . Insomnia   . Joint pain   . Kidney stones 1970's   "passed on their own" (2013-01-02)  . Knee pain   . Left shoulder pain 09/14/2016  . Low back pain 06/09/2014  . Measles as a child  . Medicare annual wellness visit, subsequent 10/06/2013   Steinhoffer of Dermatology Pneumovax in 2012   . Memory difficulties 09/05/2017  . Obesity 09/11/2017  . OSA on CPAP   .  Palpitations   . Paresthesia 03/23/2015   Left face  . PONV (postoperative nausea and vomiting)   . Preventative health care 10/06/2013   Steinhoffer of Dermatology Pneumovax in 2012  . Preventative health care 02/26/2016  . Rheumatoid arteritis ([place])   . Shortness of breath dyspnea    using stairs  . Sinus pain   . Sleep apnea   . Swelling of both lower extremities   . Thyroid cancer ([place]) 1980's  . Thyroid disease   . Tinnitus   . UTI (urinary tract infection) 04/02/2013    Past Surgical History:  Procedure Laterality Date  . CHOLECYSTECTOMY  1990  . COLON SURGERY    . COLOSTOMY REVISION  01-02-13   Procedure: COLON RESECTION SIGMOID;  Surgeon: Gwenyth Ober, MD;  Location: [facility];  Service: General;  Laterality: N/A;  . CYSTOSCOPY WITH STENT PLACEMENT  2013-01-02   Procedure: CYSTOSCOPY WITH STENT PLACEMENT;  Surgeon: Hanley Ben, MD;  Location: [facility];  Service: Urology;  Laterality: N/A;  . DILATION AND CURETTAGE OF UTERUS  1960's   "lots of them; had miscarriages" (2013-01-02)  . LYSIS OF ADHESION N/A 12/02/2015   Procedure: LAPAROSCOPIC LYSIS OF ADHESION;  Surgeon: Johnathan Hausen, MD;  Location: [facility];  Service: General;  Laterality: N/A;  . ROBOTIC  ASSISTED BILATERAL SALPINGO OOPHERECTOMY Bilateral 12/02/2015   Procedure: XI ROBOTIC ASSISTED BILATERAL SALPINGO OOPHORECTOMY;  Surgeon: Everitt Amber, MD;  Location: [facility];  Service: Gynecology;  Laterality: Bilateral;  . SIGMOID RESECTION / RECTOPEXY  12/19/2012  . THYROIDECTOMY, PARTIAL  1988   "then did iodine to remove the rest" (12/19/2012)  . TONSILLECTOMY  1951?  . TRANSRECTAL DRAINAGE OF PELVIC ABSCESS  10/27/2012  . VAGINAL HYSTERECTOMY  1970's   "still have my ovaries" (12/19/2012)    Family History  Problem Relation Age of Onset  . Heart disease Father   . Pneumonia Father   . Hypertension Father   . Hyperlipidemia Father   . Cancer Father        skin  . Stroke Father   . Alzheimer's disease Mother   . Heart  disease Mother   . Depression Mother   . Emphysema Brother        marijuana and cigarettes  . Alcohol abuse Brother   . Hearing loss Brother   . Diabetes Maternal Grandmother   . Alzheimer's disease Paternal Grandmother   . Cancer Paternal Grandmother        lung?- smoker  . Hyperlipidemia Paternal Grandmother   . Heart attack Paternal Grandfather   . Alcohol abuse Paternal Grandfather   . Neurofibromatosis Son        schwanomatosis  . Neurofibromatosis Son        swanomatosis  . Cancer Paternal Aunt     Social History   Socioeconomic History  . Marital status: Married    Spouse name: [name]  . Number of children: 2  . Years of education: Not on file  . Highest education level: Not on file  Occupational History  . Occupation: Retired  Tobacco Use  . Smoking status: Never Smoker  . Smokeless tobacco: Never Used  Substance and Sexual Activity  . Alcohol use: No    Alcohol/week: 0.0 standard drinks  . Drug use: No  . Sexual activity: Not Currently  Other Topics Concern  . Not on file  Social History Narrative   Married    Children   Right handed   Some college   Social Determinants of Health   Financial Resource Strain:   . Difficulty of Paying Living Expenses: Not on file  Food Insecurity:   . Worried About Charity fundraiser in the Last Year: Not on file  . Ran Out of Food in the Last Year: Not on file  Transportation Needs:   . Lack of Transportation (Medical): Not on file  . Lack of Transportation (Non-Medical): Not on file  Physical Activity:   . Days of Exercise per Week: Not on file  . Minutes of Exercise per Session: Not on file  Stress:   . Feeling of Stress : Not on file  Social Connections:   . Frequency of Communication with Friends and Family: Not on file  . Frequency of Social Gatherings with Friends and Family: Not on file  . Attends Religious Services: Not on file  . Active Member of Clubs or Organizations: Not on file  . Attends Theatre manager Meetings: Not on file  . Marital Status: Not on file  Intimate Partner Violence:   . Fear of Current or Ex-Partner: Not on file  . Emotionally Abused: Not on file  . Physically Abused: Not on file  . Sexually Abused: Not on file    Outpatient Medications Prior to Visit  Medication Sig Dispense Refill  .  azelastine (ASTELIN) 0.1 % nasal spray PLACE 2 SPRAYS INTO BOTH NOSTRILS 2 (TWO) TIMES DAILY. 30 mL 1  . famotidine (PEPCID) 20 MG tablet Take 40 mg by mouth daily.    . hydrochlorothiazide (HYDRODIURIL) 25 MG tablet TAKE 1 TABLET BY MOUTH EVERY DAY 90 tablet 1  . hyoscyamine (LEVSIN SL) 0.125 MG SL tablet Place 1 tablet (0.125 mg total) under the tongue every 4 (four) hours as needed. 30 tablet 1  . KETOCONAZOLE, TOPICAL, 1 % SHAM Apply 1 Dose topically once a week. 200 mL 1  . levothyroxine (SYNTHROID, LEVOTHROID) 150 MCG tablet Take 150 mcg by mouth daily before breakfast.    . meclizine (ANTIVERT) 25 MG tablet Take 1 tablet (25 mg total) by mouth 3 (three) times daily as needed for dizziness. 30 tablet 1  . Multiple Vitamins-Minerals (MULTIVITAMIN WOMEN 50+ PO) Take 1 tablet by mouth daily.    . naproxen (NAPROSYN) 375 MG tablet TAKE 1 TABLET (375 MG TOTAL) BY MOUTH 2 (TWO) TIMES DAILY WITH A MEAL. 180 tablet 1  . nitroGLYCERIN (NITROSTAT) 0.4 MG SL tablet PLACE 1 TABLET UNDER THE TONGUE EVERY 5 MINUTES AS NEEDED FOR CHEST PAIN 25 tablet 1  . NONFORMULARY OR COMPOUNDED ITEM Antifungal solution: Terbinafine 3%, Fluconazole 2%, Tea Tree Oil 5%, Urea 10%, Ibuprofen 2% in DMSO suspension #63mL 1 each 3  . omeprazole (PRILOSEC) 40 MG capsule TAKE 1 CAPSULE BY MOUTH TWICE A DAY 180 capsule 1  . prednisoLONE acetate (PRED FORTE) 1 % ophthalmic suspension PLACE 4 DROPS IN BOTH EARS TWICE DAILY AS NEEDED FOR ITCHING    . simvastatin (ZOCOR) 40 MG tablet TAKE 1 TABLET BY MOUTH EVERYDAY AT BEDTIME 90 tablet 1  . Vitamin D, Cholecalciferol, 1000 UNITS CAPS Take 1 capsule by mouth daily.     Marland Kitchen losartan (COZAAR) 100 MG tablet TAKE 1 TABLET BY MOUTH EVERY DAY 90 tablet 1  . metoprolol tartrate (LOPRESSOR) 100 MG tablet Take 1 tablet (100 mg total) by mouth 2 (two) times daily. 60 tablet 1   No facility-administered medications prior to visit.    Allergies  Allergen Reactions  . Neomycin-Bacitracin Zn-Polymyx Rash    Polysporin- is tolerated   . Niacin Other (See Comments) and Cough    "cough til I threw up" (12/19/2012)  . Ciprofloxacin Hives    Got cipro and flagyl at same time, localized hives to IV arm  . Flagyl [Metronidazole] Hives    Got cipro and flagyl at same time, localized hives to IV arm    Review of Systems  Constitutional: Negative for fever and malaise/fatigue.  HENT: Positive for ear pain. Negative for congestion.   Eyes: Negative for blurred vision.  Respiratory: Negative for shortness of breath.   Cardiovascular: Negative for chest pain, palpitations and leg swelling.  Gastrointestinal: Negative for abdominal pain, blood in stool and nausea.  Genitourinary: Negative for dysuria and frequency.  Musculoskeletal: Positive for joint pain. Negative for falls.  Skin: Positive for itching. Negative for rash.  Neurological: Negative for dizziness, loss of consciousness and headaches.  Endo/Heme/Allergies: Negative for environmental allergies.  Psychiatric/Behavioral: Negative for depression. The patient is not nervous/anxious.        Objective:    Physical Exam Vitals and nursing note reviewed.  Constitutional:      General: She is not in acute distress.    Appearance: She is well-developed.  HENT:     Head: Normocephalic and atraumatic.     Nose: Nose normal.  Eyes:  General:        Right eye: No discharge.        Left eye: No discharge.  Cardiovascular:     Rate and Rhythm: Normal rate and regular rhythm.     Heart sounds: Murmur present.  Pulmonary:     Effort: Pulmonary effort is normal.     Breath sounds: Normal breath sounds.    Abdominal:     General: Bowel sounds are normal.     Palpations: Abdomen is soft.     Tenderness: There is no abdominal tenderness.  Musculoskeletal:        General: Deformity present.     Cervical back: Normal range of motion and neck supple.     Comments: Nodules noted on fingers of both hands DIP joints.  Skin:    General: Skin is warm and dry.  Neurological:     Mental Status: She is alert and oriented to person, place, and time.     BP (!) 180/68 (BP Location: Left Arm, Patient Position: Sitting, Cuff Size: Normal)   Pulse 65   Temp 98.5 F (36.9 C) (Temporal)   Resp 18   Wt 203 lb 3.2 oz (92.2 kg)   SpO2 99%   BMI 36.00 kg/m  Wt Readings from Last 3 Encounters:  12/17/19 203 lb 3.2 oz (92.2 kg)  11/26/19 204 lb (92.5 kg)  09/13/19 194 lb (88 kg)    Diabetic Foot Exam - Simple   No data filed     Lab Results  Component Value Date   WBC 6.5 08/02/2019   HGB 13.0 08/02/2019   HCT 39.3 08/02/2019   PLT 275.0 08/02/2019   GLUCOSE 100 (H) 08/02/2019   CHOL 155 08/02/2019   TRIG 125.0 08/02/2019   HDL 57.90 08/02/2019   LDLCALC 73 08/02/2019   ALT 21 08/02/2019   AST 18 08/02/2019   NA 135 08/02/2019   K 4.8 08/02/2019   CL 96 08/02/2019   CREATININE 0.76 08/02/2019   BUN 20 08/02/2019   CO2 32 08/02/2019   TSH 1.06 08/02/2019   INR 0.99 03/16/2015   HGBA1C 5.8 08/02/2019    Lab Results  Component Value Date   TSH 1.06 08/02/2019   Lab Results  Component Value Date   WBC 6.5 08/02/2019   HGB 13.0 08/02/2019   HCT 39.3 08/02/2019   MCV 88.9 08/02/2019   PLT 275.0 08/02/2019   Lab Results  Component Value Date   NA 135 08/02/2019   K 4.8 08/02/2019   CO2 32 08/02/2019   GLUCOSE 100 (H) 08/02/2019   BUN 20 08/02/2019   CREATININE 0.76 08/02/2019   BILITOT 0.3 08/02/2019   ALKPHOS 73 08/02/2019   AST 18 08/02/2019   ALT 21 08/02/2019   PROT 6.6 08/02/2019   ALBUMIN 4.5 08/02/2019   CALCIUM 8.9 08/02/2019   ANIONGAP 12 07/17/2019   GFR  74.19 08/02/2019   Lab Results  Component Value Date   CHOL 155 08/02/2019   Lab Results  Component Value Date   HDL 57.90 08/02/2019   Lab Results  Component Value Date   LDLCALC 73 08/02/2019   Lab Results  Component Value Date   TRIG 125.0 08/02/2019   Lab Results  Component Value Date   CHOLHDL 3 08/02/2019   Lab Results  Component Value Date   HGBA1C 5.8 08/02/2019       Assessment & Plan:   Problem List Items Addressed This Visit    Hyperlipidemia, mixed - Primary  Encouraged heart healthy diet, increase exercise, avoid trans fats, consider a krill oil cap daily      Relevant Medications   losartan (COZAAR) 100 MG tablet   metoprolol tartrate (LOPRESSOR) 100 MG tablet   Other Relevant Orders   Lipid panel   Essential hypertension   Relevant Medications   losartan (COZAAR) 100 MG tablet   metoprolol tartrate (LOPRESSOR) 100 MG tablet   Other Relevant Orders   CBC   CMP   Hypothyroidism    On Levothyroxine, continue to monitor      Relevant Medications   metoprolol tartrate (LOPRESSOR) 100 MG tablet   Other Relevant Orders   TSH   Otitis media    Left ear. Started on Cefdinir and probiotics report if no improvement for referral to ENT      Relevant Medications   cefdinir (OMNICEF) 300 MG capsule   Vitamin B12 deficiency    Supplement and monitor      Mild cognitive impairment    Is progressing and she is currently working with neurology for more extensive evaluation. They have her scheduled for MRI and neuropsychiatric evaluation.       Vitamin D deficiency    Supplement and monitor      Relevant Orders   Vitamin D (25 hydroxy)    Other Visit Diagnoses    Hyperglycemia       Relevant Orders   A1C      I have changed Lasondra P. Barse's losartan and metoprolol tartrate. I am also having her start on cefdinir. Additionally, I am having her maintain her Vitamin D (Cholecalciferol), Multiple Vitamins-Minerals (MULTIVITAMIN WOMEN 50+  PO), KETOCONAZOLE (TOPICAL), levothyroxine, nitroGLYCERIN, hyoscyamine, omeprazole, naproxen, hydrochlorothiazide, meclizine, famotidine, prednisoLONE acetate, NONFORMULARY OR COMPOUNDED ITEM, simvastatin, and azelastine.  Meds ordered this encounter  Medications  . losartan (COZAAR) 100 MG tablet    Sig: Take 1 tablet (100 mg total) by mouth at bedtime.    Dispense:  90 tablet    Refill:  1  . metoprolol tartrate (LOPRESSOR) 100 MG tablet    Sig: Take 1.5 tablets (150 mg total) by mouth 2 (two) times daily.    Dispense:  90 tablet    Refill:  2  . cefdinir (OMNICEF) 300 MG capsule    Sig: Take 1 capsule (300 mg total) by mouth 2 (two) times daily for 10 days.    Dispense:  20 capsule    Refill:  0     Penni Homans, MD

## 2019-12-19 NOTE — Assessment & Plan Note (Signed)
Is progressing and she is currently working with neurology for more extensive evaluation. They have her scheduled for MRI and neuropsychiatric evaluation.

## 2019-12-19 NOTE — Assessment & Plan Note (Signed)
Supplement and monitor 

## 2019-12-19 NOTE — Assessment & Plan Note (Signed)
Poorly controlled will alter medications, encouraged DASH diet, minimize caffeine and obtain adequate sleep. Report concerning symptoms and follow up as directed and as needed. Increase Metoprolol to 150 mg po bid and reassess. Continue Cozaar.

## 2019-12-19 NOTE — Assessment & Plan Note (Signed)
Left ear. Started on Cefdinir and probiotics report if no improvement for referral to ENT

## 2019-12-19 NOTE — Patient Instructions (Signed)

## 2019-12-19 NOTE — Assessment & Plan Note (Signed)
Encouraged heart healthy diet, increase exercise, avoid trans fats, consider a krill oil cap daily 

## 2019-12-19 NOTE — Assessment & Plan Note (Signed)
On Levothyroxine, continue to monitor 

## 2019-12-20 ENCOUNTER — Ambulatory Visit
Admission: RE | Admit: 2019-12-20 | Discharge: 2019-12-20 | Disposition: A | Discharge status: Home / Self Care | Payer: Medicare HMO | Source: Ambulatory Visit | Attending: Neurology | Admitting: Neurology

## 2019-12-20 DIAGNOSIS — R413 Other amnesia: Secondary | ICD-10-CM

## 2019-12-23 NOTE — Progress Notes (Signed)
Subjective: Jackie Stevens presents to clinic with cc of painful mycotic toenails, corns and callus which are aggravated when weightbearing with and without shoe gear.  This pain limits her daily activities. Pain symptoms resolve with periodic professional debridement.  Mosie Lukes, MD is her PCP. Last visit was 12/17/2019.  She states she is being worked up for memory problems.   Medications reviewed in chart.  Allergies  Allergen Reactions  . Neomycin-Bacitracin Zn-Polymyx Rash    Polysporin- is tolerated   . Niacin Other (See Comments) and Cough    "cough til I threw up" (12/19/2012)  . Ciprofloxacin Hives    Got cipro and flagyl at same time, localized hives to IV arm  . Flagyl [Metronidazole] Hives    Got cipro and flagyl at same time, localized hives to IV arm     Objective: There were no vitals filed for this visit.  Physical Examination:  Vascular  Examination: Capillary refill time to digits <3 seconds b/l.   Palpable DP/PT pulses b/l.  Digital hair present b/l.  No edema noted b/l.  Skin temperature gradient WNL b/l.  Dermatological Examination: Skin with normal turgor, texture and tone b/l.  No open wounds b/l.  No interdigital macerations noted b/l.  Elongated, thick, discolored brittle toenails with subungual debris and pain on dorsal palpation of nailbeds 1-5 b/l.  Hyperkeratotic lesion lateral 4th digit left foot, medial DIPJ right 2nd digit with tenderness to palpation. No edema, no erythema, no drainage, no flocculence.   Porokeratotic lesions submet head 4 right foot, submet head 2 right foot with tenderness to palpation. No erythema, no edema, no drainage, no flocculence.   Musculoskeletal Examination: Muscle strength 5/5 to all muscle groups b/l.  HAV with bunion b/l. Overlapping HT 2nd digit b/l.  No pain, crepitus or joint discomfort with active/passive ROM.  Neurological Examination: Sensation intact 5/5 b/l with 10 gram  monofilament.  Vibratory sensation intact b/l.  Proprioceptive sensation intact b/l.  Assessment: 1. Mycotic nail infection with pain 1-5 b/l 2. Porokeratotic lesions submet head 2, 4 right foot 3. Corns left 4th, right 2nd digit 4. Pain in both feet  Plan: 1. Toenails 1-5 b/l were debrided in length and girth without iatrogenic laceration.  2. Porokeratosis submet head 2, 4 right foot pared and enucleated with sterile scalpel blade without incident. 3. Corn(s) left 4th, right 2nd digit pared utilizing sterile scalpel blade without incident. 4. Continue soft, supportive shoe gear daily. 5. Report any pedal injuries to medical professional. 6. Follow up 3 months. 7. Patient/POA to call should there be a question/concern in there interim.

## 2019-12-25 ENCOUNTER — Other Ambulatory Visit: Payer: Self-pay | Admitting: Family Medicine

## 2019-12-25 DIAGNOSIS — K219 Gastro-esophageal reflux disease without esophagitis: Secondary | ICD-10-CM

## 2019-12-25 DIAGNOSIS — R059 Cough, unspecified: Secondary | ICD-10-CM

## 2019-12-25 DIAGNOSIS — R05 Cough: Secondary | ICD-10-CM

## 2019-12-25 DIAGNOSIS — J302 Other seasonal allergic rhinitis: Secondary | ICD-10-CM

## 2020-01-15 ENCOUNTER — Other Ambulatory Visit: Payer: Self-pay | Admitting: Family Medicine

## 2020-01-17 ENCOUNTER — Ambulatory Visit (INDEPENDENT_AMBULATORY_CARE_PROVIDER_SITE_OTHER): Payer: Medicare HMO | Admitting: Family Medicine

## 2020-01-17 ENCOUNTER — Other Ambulatory Visit: Payer: Self-pay

## 2020-01-17 ENCOUNTER — Encounter: Payer: Self-pay | Admitting: Family Medicine

## 2020-01-17 DIAGNOSIS — E782 Mixed hyperlipidemia: Secondary | ICD-10-CM | POA: Diagnosis not present

## 2020-01-17 DIAGNOSIS — R739 Hyperglycemia, unspecified: Secondary | ICD-10-CM

## 2020-01-17 DIAGNOSIS — I10 Essential (primary) hypertension: Secondary | ICD-10-CM

## 2020-01-17 DIAGNOSIS — E538 Deficiency of other specified B group vitamins: Secondary | ICD-10-CM

## 2020-01-17 DIAGNOSIS — E559 Vitamin D deficiency, unspecified: Secondary | ICD-10-CM | POA: Diagnosis not present

## 2020-01-17 DIAGNOSIS — E038 Other specified hypothyroidism: Secondary | ICD-10-CM

## 2020-01-17 HISTORY — DX: Hyperglycemia, unspecified: R73.9

## 2020-01-17 NOTE — Patient Instructions (Signed)
[facility] Blood Pressure cuff, upper arm, want BP 100-140/60-90 Pulse oximeter, want oxygen in 90s  Weekly vitals  Take Multivitamin with minerals, selenium Vitamin D 1000-2000 IU daily Probiotic with lactobacillus and bifidophilus Asprin EC 81 mg daily  Melatonin 2-5 mg at bedtime  [email or web address] 

## 2020-01-17 NOTE — Progress Notes (Signed)
In the Am she gets hot and flushed

## 2020-01-21 DIAGNOSIS — G4733 Obstructive sleep apnea (adult) (pediatric): Secondary | ICD-10-CM | POA: Diagnosis not present

## 2020-01-21 NOTE — Progress Notes (Signed)
Patient ID: Jackie Stevens, female   DOB: [birth year], 74 y.o.   MRN: [id number] Virtual Visit via phone Note  I connected with Jackie Stevens on 01/17/20 at  3:00 PM EST by a phone enabled telemedicine application and verified that I am speaking with the correct person using two identifiers.  Location: Patient: home Provider: office   I discussed the limitations of evaluation and management by telemedicine and the availability of in person appointments. The patient expressed understanding and agreed to proceed. Magdalene Molly, CMA was able to get the patient set up on a visit, Phone after being unable to set up a video visit   Subjective:    Patient ID: Jackie Stevens, female    DOB: 04-01-1946, 74 y.o.   MRN: [id number]  No chief complaint on file.   HPI Patient is in today for follow up on chronic medical concerns. She is doing well. She and her husband are maintaining quarantine strictly. They are less active but trying to eat well. No recent febrile illness or hospitalizations. Denies CP/palp/SOB/HA/congestion/fevers/GI or GU c/o. Taking meds as prescribed  Past Medical History:  Diagnosis Date  . Abnormal cervical cytology 10/25/2012   Follows with Dr Leavy Cella of Gyn  . Allergic rhinitis   . Anemia 10/06/2013  . Anginal pain ([place])    pt has history of CP states had cardiac workup with no specific issues identified   . Anxiety    when increased stress   . Arthritis    "knees; right thumb; shoulders" (2013-01-18)  . Asthma   . Chest pain   . Chicken pox as a child  . Complication of anesthesia   . Constipation   . Decreased hearing   . Dermatitis 03/06/2017  . Diverticulitis 10/25/2012   pt. reports that a drain was placed - 09/2012    . Dry mouth   . Excessive thirst   . External hemorrhoid, bleeding    "sometimes" (01-18-2013)  . Frequent urination   . GERD (gastroesophageal reflux disease)   . H/O hiatal hernia   . Hand tingling   . Heart murmur   . HTN  (hypertension)    stress test completed by Anselm Lis, diagnosed as GERD  . Hyperlipidemia   . Hypothyroidism   . Incontinence   . Infertility, female   . Insomnia   . Joint pain   . Kidney stones 1970's   "passed on their own" (2013-01-18)  . Knee pain   . Left shoulder pain 09/14/2016  . Low back pain 06/09/2014  . Measles as a child  . Medicare annual wellness visit, subsequent 10/06/2013   Steinhoffer of Dermatology Pneumovax in 2012   . Memory difficulties 09/05/2017  . Obesity 09/11/2017  . OSA on CPAP   . Palpitations   . Paresthesia 03/23/2015   Left face  . PONV (postoperative nausea and vomiting)   . Preventative health care 10/06/2013   [facility] of Dermat[facility] Pneumovax in 2012  . Preventative health care 02/26/2016  . Rheumatoid arteritis ([place])   . Shortness of breath dyspnea    using stairs  . Sinus pain   . Sleep apnea   . Swelling of both lower extremities   . Thyroid cancer ([place]) 1980's  . Thyroid disease   . Tinnitus   . UTI (urinary tract infection) 04/02/2013    Past Surgical History:  Procedure Laterality Date  . CHOLECYSTECTOMY  1990  . COLON SURGERY    . COLOSTOMY REVISION  01-18-2013  Procedure: COLON RESECTION SIGMOID;  Surgeon: Gwenyth Ober, MD;  Location: [facility];  Service: General;  Laterality: N/A;  . CYSTOSCOPY WITH STENT PLACEMENT  12/19/2012   Procedure: CYSTOSCOPY WITH STENT PLACEMENT;  Surgeon: Hanley Ben, MD;  Location: [place] [facility];  Service: Urology;  Laterality: N/A;  . DILATION AND CURETTAGE OF UTERUS  1960's   "lots of them; had miscarriages" (12/19/2012)  . LYSIS OF ADHESION N/A 12/02/2015   Procedure: LAPAROSCOPIC LYSIS OF ADHESION;  Surgeon: Johnathan Hausen, MD;  Location: [facility];  Service: General;  Laterality: N/A;  . ROBOTIC ASSISTED BILATERAL SALPINGO OOPHERECTOMY Bilateral 12/02/2015   Procedure: XI ROBOTIC ASSISTED BILATERAL SALPINGO OOPHORECTOMY;  Surgeon: Everitt Amber, MD;  Location: [facility];  Service: Gynecology;  Laterality:  Bilateral;  . SIGMOID RESECTION / RECTOPEXY  12/19/2012  . THYROIDECTOMY, PARTIAL  1988   "then did iodine to remove the rest" (12/19/2012)  . TONSILLECTOMY  1951?  . TRANSRECTAL DRAINAGE OF PELVIC ABSCESS  10/27/2012  . VAGINAL HYSTERECTOMY  1970's   "still have my ovaries" (12/19/2012)    Family History  Problem Relation Age of Onset  . Heart disease Father   . Pneumonia Father   . Hypertension Father   . Hyperlipidemia Father   . Cancer Father        skin  . Stroke Father   . Alzheimer's disease Mother   . Heart disease Mother   . Depression Mother   . Emphysema Brother        marijuana and cigarettes  . Alcohol abuse Brother   . Hearing loss Brother   . Diabetes Maternal Grandmother   . Alzheimer's disease Paternal Grandmother   . Cancer Paternal Grandmother        lung?- smoker  . Hyperlipidemia Paternal Grandmother   . Heart attack Paternal Grandfather   . Alcohol abuse Paternal Grandfather   . Neurofibromatosis Son        schwanomatosis  . Neurofibromatosis Son        swanomatosis  . Cancer Paternal Aunt     Social History   Socioeconomic History  . Marital status: Married    Spouse name: [name]  . Number of children: 2  . Years of education: Not on file  . Highest education level: Not on file  Occupational History  . Occupation: Retired  Tobacco Use  . Smoking status: Never Smoker  . Smokeless tobacco: Never Used  Substance and Sexual Activity  . Alcohol use: No    Alcohol/week: 0.0 standard drinks  . Drug use: No  . Sexual activity: Not Currently  Other Topics Concern  . Not on file  Social History Narrative   Married    Children   Right handed   Some college   Social Determinants of Health   Financial Resource Strain:   . Difficulty of Paying Living Expenses: Not on file  Food Insecurity:   . Worried About Charity fundraiser in the Last Year: Not on file  . Ran Out of Food in the Last Year: Not on file  Transportation Needs:   . Lack of  Transportation (Medical): Not on file  . Lack of Transportation (Non-Medical): Not on file  Physical Activity:   . Days of Exercise per Week: Not on file  . Minutes of Exercise per Session: Not on file  Stress:   . Feeling of Stress : Not on file  Social Connections:   . Frequency of Communication with Friends and Family: Not on file  .  Frequency of Social Gatherings with Friends and Family: Not on file  . Attends Religious Services: Not on file  . Active Member of Clubs or Organizations: Not on file  . Attends Archivist Meetings: Not on file  . Marital Status: Not on file  Intimate Partner Violence:   . Fear of Current or Ex-Partner: Not on file  . Emotionally Abused: Not on file  . Physically Abused: Not on file  . Sexually Abused: Not on file    Outpatient Medications Prior to Visit  Medication Sig Dispense Refill  . azelastine (ASTELIN) 0.1 % nasal spray PLACE 2 SPRAYS INTO BOTH NOSTRILS 2 (TWO) TIMES DAILY. 30 mL 1  . famotidine (PEPCID) 20 MG tablet TAKE 2 TABLETS BY MOUTH EVERY DAY 180 tablet 1  . fluticasone (FLONASE) 50 MCG/ACT nasal spray Allergy Relief (fluticasone) 50 mcg/actuation nasal spray,suspension  Spray 1 spray every day by intranasal route.    . gabapentin (NEURONTIN) 100 MG capsule gabapentin 100 mg capsule  Take 1 capsule 3 times a day by oral route.    . hydrochlorothiazide (HYDRODIURIL) 25 MG tablet TAKE 1 TABLET BY MOUTH EVERY DAY 90 tablet 1  . hyoscyamine (LEVSIN SL) 0.125 MG SL tablet Place 1 tablet (0.125 mg total) under the tongue every 4 (four) hours as needed. 30 tablet 1  . KETOCONAZOLE, TOPICAL, 1 % SHAM Apply 1 Dose topically once a week. 200 mL 1  . levothyroxine (SYNTHROID, LEVOTHROID) 150 MCG tablet Take 150 mcg by mouth daily before breakfast.    . losartan (COZAAR) 100 MG tablet Take 1 tablet (100 mg total) by mouth at bedtime. 90 tablet 1  . meclizine (ANTIVERT) 25 MG tablet Take 1 tablet (25 mg total) by mouth 3 (three) times  daily as needed for dizziness. 30 tablet 1  . metoprolol tartrate (LOPRESSOR) 100 MG tablet Take 1.5 tablets (150 mg total) by mouth 2 (two) times daily. 90 tablet 2  . Multiple Vitamins-Minerals (MULTIVITAMIN WOMEN 50+ PO) Take 1 tablet by mouth daily.    . naproxen (NAPROSYN) 375 MG tablet TAKE 1 TABLET (375 MG TOTAL) BY MOUTH 2 (TWO) TIMES DAILY WITH A MEAL. 180 tablet 1  . nitroGLYCERIN (NITROSTAT) 0.4 MG SL tablet PLACE 1 TABLET UNDER THE TONGUE EVERY 5 MINUTES AS NEEDED FOR CHEST PAIN 25 tablet 1  . NONFORMULARY OR COMPOUNDED ITEM Antifungal solution: Terbinafine 3%, Fluconazole 2%, Tea Tree Oil 5%, Urea 10%, Ibuprofen 2% in DMSO suspension #15mL 1 each 3  . omeprazole (PRILOSEC) 40 MG capsule TAKE 1 CAPSULE BY MOUTH TWICE A DAY 180 capsule 1  . prednisoLONE acetate (PRED FORTE) 1 % ophthalmic suspension PLACE 4 DROPS IN BOTH EARS TWICE DAILY AS NEEDED FOR ITCHING    . simvastatin (ZOCOR) 40 MG tablet TAKE 1 TABLET BY MOUTH EVERYDAY AT BEDTIME 90 tablet 1  . Vitamin D, Cholecalciferol, 1000 UNITS CAPS Take 1 capsule by mouth daily.     No facility-administered medications prior to visit.    Allergies  Allergen Reactions  . Neomycin-Bacitracin Zn-Polymyx Rash    Polysporin- is tolerated   . Niacin Other (See Comments) and Cough    "cough til I threw up" (12/19/2012)  . Ciprofloxacin Hives    Got cipro and flagyl at same time, localized hives to IV arm  . Flagyl [Metronidazole] Hives    Got cipro and flagyl at same time, localized hives to IV arm    Review of Systems  Constitutional: Negative for fever and malaise/fatigue.  HENT:  Negative for congestion.   Eyes: Negative for blurred vision.  Respiratory: Negative for shortness of breath.   Cardiovascular: Negative for chest pain, palpitations and leg swelling.  Gastrointestinal: Negative for abdominal pain, blood in stool and nausea.  Genitourinary: Negative for dysuria and frequency.  Musculoskeletal: Negative for falls.  Skin:  Negative for rash.  Neurological: Negative for dizziness, loss of consciousness and headaches.  Endo/Heme/Allergies: Negative for environmental allergies.  Psychiatric/Behavioral: Negative for depression. The patient is not nervous/anxious.        Objective:    Physical Exam unable to obtain via phone visit  BP (!) 146/64 (BP Location: Left Arm, Patient Position: Sitting, Cuff Size: Normal)   Temp (!) 96.6 F (35.9 C) (Oral)   Wt 208 lb (94.3 kg)   BMI 36.85 kg/m  Wt Readings from Last 3 Encounters:  01/17/20 208 lb (94.3 kg)  12/17/19 203 lb 3.2 oz (92.2 kg)  11/26/19 204 lb (92.5 kg)    Diabetic Foot Exam - Simple   No data filed     Lab Results  Component Value Date   WBC 6.5 08/02/2019   HGB 13.0 08/02/2019   HCT 39.3 08/02/2019   PLT 275.0 08/02/2019   GLUCOSE 100 (H) 08/02/2019   CHOL 155 08/02/2019   TRIG 125.0 08/02/2019   HDL 57.90 08/02/2019   LDLCALC 73 08/02/2019   ALT 21 08/02/2019   AST 18 08/02/2019   NA 135 08/02/2019   K 4.8 08/02/2019   CL 96 08/02/2019   CREATININE 0.76 08/02/2019   BUN 20 08/02/2019   CO2 32 08/02/2019   TSH 1.06 08/02/2019   INR 0.99 03/16/2015   HGBA1C 5.8 08/02/2019    Lab Results  Component Value Date   TSH 1.06 08/02/2019   Lab Results  Component Value Date   WBC 6.5 08/02/2019   HGB 13.0 08/02/2019   HCT 39.3 08/02/2019   MCV 88.9 08/02/2019   PLT 275.0 08/02/2019   Lab Results  Component Value Date   NA 135 08/02/2019   K 4.8 08/02/2019   CO2 32 08/02/2019   GLUCOSE 100 (H) 08/02/2019   BUN 20 08/02/2019   CREATININE 0.76 08/02/2019   BILITOT 0.3 08/02/2019   ALKPHOS 73 08/02/2019   AST 18 08/02/2019   ALT 21 08/02/2019   PROT 6.6 08/02/2019   ALBUMIN 4.5 08/02/2019   CALCIUM 8.9 08/02/2019   ANIONGAP 12 07/17/2019   GFR 74.19 08/02/2019   Lab Results  Component Value Date   CHOL 155 08/02/2019   Lab Results  Component Value Date   HDL 57.90 08/02/2019   Lab Results  Component Value  Date   LDLCALC 73 08/02/2019   Lab Results  Component Value Date   TRIG 125.0 08/02/2019   Lab Results  Component Value Date   CHOLHDL 3 08/02/2019   Lab Results  Component Value Date   HGBA1C 5.8 08/02/2019       Assessment & Plan:   Problem List Items Addressed This Visit    Hyperlipidemia, mixed    Encouraged heart healthy diet, increase exercise, avoid trans fats, consider a krill oil cap daily, tolerating Simvastatin      Essential hypertension    Monitor and report any concerns, no changes to meds. Encouraged heart healthy diet such as the DASH diet and exercise as tolerated.       Hypothyroidism    On Levothyroxine, continue to monitor      Vitamin B12 deficiency    Supplement and  monitor      Vitamin D deficiency    Supplement and monitor      Hyperglycemia    hgba1c acceptable, minimize simple carbs. Increase exercise as tolerated. She feels flushed in am at times. Suspect it is low blood sugars, encouraged to try eating protein in the evening to keep blood sugars from dropping         I am having Jackie Stevens maintain her Vitamin D (Cholecalciferol), Multiple Vitamins-Minerals (MULTIVITAMIN WOMEN 50+ PO), KETOCONAZOLE (TOPICAL), levothyroxine, nitroGLYCERIN, hyoscyamine, naproxen, hydrochlorothiazide, meclizine, prednisoLONE acetate, NONFORMULARY OR COMPOUNDED ITEM, simvastatin, azelastine, losartan, metoprolol tartrate, fluticasone, gabapentin, omeprazole, and famotidine.  No orders of the defined types were placed in this encounter.    I discussed the assessment and treatment plan with the patient. The patient was provided an opportunity to ask questions and all were answered. The patient agreed with the plan and demonstrated an understanding of the instructions.   The patient was advised to call back or seek an in-person evaluation if the symptoms worsen or if the condition fails to improve as anticipated.  I provided 25 minutes of  non-face-to-face time during this encounter.   Penni Homans, MD

## 2020-01-21 NOTE — Assessment & Plan Note (Signed)
On Levothyroxine, continue to monitor 

## 2020-01-21 NOTE — Assessment & Plan Note (Signed)
Supplement and monitor 

## 2020-01-21 NOTE — Assessment & Plan Note (Addendum)
Encouraged heart healthy diet, increase exercise, avoid trans fats, consider a krill oil cap daily, tolerating Simvastatin 

## 2020-01-21 NOTE — Assessment & Plan Note (Signed)
Monitor and report any concerns, no changes to meds. Encouraged heart healthy diet such as the DASH diet and exercise as tolerated.  ?

## 2020-01-21 NOTE — Assessment & Plan Note (Addendum)
hgba1c acceptable, minimize simple carbs. Increase exercise as tolerated. She feels flushed in am at times. Suspect it is low blood sugars, encouraged to try eating protein in the evening to keep blood sugars from dropping

## 2020-01-22 ENCOUNTER — Other Ambulatory Visit: Payer: Self-pay | Admitting: Family Medicine

## 2020-01-25 DIAGNOSIS — G4733 Obstructive sleep apnea (adult) (pediatric): Secondary | ICD-10-CM | POA: Diagnosis not present

## 2020-01-29 ENCOUNTER — Other Ambulatory Visit: Payer: Self-pay | Admitting: Family Medicine

## 2020-02-01 ENCOUNTER — Other Ambulatory Visit: Payer: Self-pay | Admitting: Family Medicine

## 2020-02-04 ENCOUNTER — Other Ambulatory Visit: Payer: Self-pay | Admitting: Family Medicine

## 2020-02-04 ENCOUNTER — Encounter: Payer: Self-pay | Admitting: Psychology

## 2020-02-04 ENCOUNTER — Ambulatory Visit (INDEPENDENT_AMBULATORY_CARE_PROVIDER_SITE_OTHER): Payer: Medicare HMO | Admitting: Psychology

## 2020-02-04 ENCOUNTER — Ambulatory Visit: Payer: Medicare HMO | Admitting: Psychology

## 2020-02-04 ENCOUNTER — Other Ambulatory Visit: Payer: Self-pay

## 2020-02-04 DIAGNOSIS — G3184 Mild cognitive impairment, so stated: Secondary | ICD-10-CM

## 2020-02-04 DIAGNOSIS — I10 Essential (primary) hypertension: Secondary | ICD-10-CM

## 2020-02-04 DIAGNOSIS — E782 Mixed hyperlipidemia: Secondary | ICD-10-CM

## 2020-02-04 DIAGNOSIS — R739 Hyperglycemia, unspecified: Secondary | ICD-10-CM

## 2020-02-04 DIAGNOSIS — R413 Other amnesia: Secondary | ICD-10-CM

## 2020-02-04 DIAGNOSIS — E038 Other specified hypothyroidism: Secondary | ICD-10-CM

## 2020-02-04 NOTE — Progress Notes (Signed)
   Psychometrician Note   Cognitive testing was administered to Jackie Stevens by Jackie Stevens, B.S. (psychometrist) under the supervision of Jackie Stevens, Ph.D., licensed psychologist. Jackie Stevens did not appear overtly distressed by the testing session per behavioral observation or responses across self-report questionnaires. Jackie Stevens, Ph.D. checked in with Jackie Stevens as needed to manage any distress related to testing procedures (if applicable). Rest breaks were offered.    The battery of tests administered was selected by Jackie Stevens, Ph.D. with consideration to Jackie Stevens's current level of functioning, the nature of her symptoms, emotional and behavioral responses during interview, level of literacy, observed level of motivation/effort, and the nature of the referral question. This battery was communicated to the psychometrist. Communication between Jackie Stevens, Ph.D. and the psychometrist was ongoing throughout the evaluation and Jackie Stevens, Ph.D. was immediately accessible at all times. Jackie Stevens, Ph.D. provided supervision to the psychometrist on the date of this service to the extent necessary to assure the quality of all services provided.    Jackie Stevens will return within approximately 1-2 weeks for an interactive feedback session with Jackie Stevens at which time her test performances, clinical impressions, and treatment recommendations will be reviewed in detail. Jackie Stevens understands she can contact our office should she require our assistance before this time.  A total of 135 minutes of billable time were spent face-to-face with Jackie Stevens by the psychometrist. This includes both test administration and scoring time. Billing for these services is reflected in the clinical report generated by Jackie Stevens, Ph.D..  This note reflects time spent with the psychometrician and does not include test scores or any clinical interpretations  made by Jackie Stevens. The full report will follow in a separate note.

## 2020-02-04 NOTE — Progress Notes (Signed)
NEUROPSYCHOLOGICAL EVALUATION [place]  Reason for Referral:   Jackie Stevens is a 74 y.o. right-handed Caucasian female referred by Jackie Stevens, M.D., to characterize her current cognitive functioning and assist with diagnostic clarity and treatment planning in the context of subjective cognitive decline and a family history of Alzheimer's disease.  Assessment and Plan:   Clinical Impression(s): Jackie Stevens pattern of performance is suggestive of primary impairments surrounding new learning and memory, semantic fluency, and confrontation naming. Mild variability was also exhibited across processing speed and executive functioning. Performance was within appropriate normative ranges given premorbid intellectual estimations across domains of attention/concentration, receptive language, phonemic fluency, and visuospatial functioning. Jackie Stevens denied difficulties completing instrumental activities of daily living (ADLs) independently. As such, given evidence for cognitive dysfunction described above, she meets criteria for a Mild Neurocognitive Disorder (formerly "mild cognitive impairment") at the present time.  The etiology of her current deficits is unclear currently. Despite this, her pattern of performance across cognitive testing is somewhat concerning for the early stages of Alzheimer's disease. Across memory measures, she exhibited flat learning curves and difficulties retrieving this information after lengthy delays. Impairments in confrontation naming and a large discrepancy between semantic and phonemic fluency scores is also consistent with this presentation. However, despite exhibited variability, consolidation scores across story and shape learning tasks were largely appropriate and not wholly suggestive of a memory storage deficit, which is inconsistent with what is expected in Alzheimer's disease. There remains the potential that  she benefited from the yes/no presentation of these items and scores could be mildly inflated given the chance of guessing correctly. Neuroimaging did not suggest concerning white matter disease and behavioral characteristics do not appear consistent with other forms of neurodegenerative illness presently. No mood-related concerns were reported. Continued medical monitoring will be important moving forward.  Recommendations: A repeat neuropsychological evaluation in 12-18 months (or sooner if functional decline is noted) is recommended to assess the trajectory of future cognitive decline should it occur. This will also aid in future efforts towards improved diagnostic clarity.  Jackie Stevens may wish to discuss the pros and cons of various medication interventions aimed at assisting with subjective and objective memory decline with her neurologist.   Jackie Stevens is encouraged to attend to lifestyle factors for brain health (e.g., regular physical exercise and general stress management techniques), which are likely to have benefits for both emotional adjustment and cognition. Optimal control of vascular risk factors is encouraged. Continued participation in activities which provide mental stimulation and social interaction is also recommended. Proper diet and nutrition is also important and she is encouraged to look into the MIND diet.   For day-to-day problems recalling information, she may benefit from using strategies to aid with her learning and memory, such as asking questions for clarification, requesting that information to be repeated, or repeating an explanation in her own words to ensure comprehension and promote encoding.    When learning new information, she would benefit from information being broken up into small, manageable pieces. She may also find it helpful to articulate the material in her own words and in a context to promote encoding at the onset of a new task. This material may need to be  repeated multiple times to promote encoding. Additionally, all important information should also be provided in written format. This should be placed in a clearly visible and highly frequented place within her home to help with recollection.   To address  problems with processing speed, she may wish to consider:   -Scheduling more difficult activities for a time of day when she is usually most alert   -Ensuring that she is alerted when essential material or instructions are being presented   -Adjusting the speed at which new information is presented   -Allowing additional processing time or a chance to rehearse novel information  To address problems with fluctuating attention, she may wish to consider:   -Avoiding external distractions when needing to concentrate   -Limiting exposure to fast paced environments with multiple sensory demands   -Writing down complicated information and using checklists   -Attempting and completing one task at a time (i.e., no multi-tasking)   -Verbalizing aloud each step of a task to maintain focus   -Reducing the amount of information considered at one time  Review of Records:   Jackie Stevens was seen by [facility] Marland KitchenEllouise Stevens, M.D.) on 11/26/2019 for an evaluation of memory loss. She described her memory as poor, stating that there are certain things she cannot remember. She often looked to her husband for answers or assistance describing difficulties. She reported managing medications without difficulties and denied getting lost while driving. She became concerned about her memory after having some difficulty balancing her checkbook earlier in the month. Her husband stated that the checkbook issue "threw her off" and was not convinced that there was something seriously wrong with her memory. He did not endorse any personality changes but they both acknowledged increased paranoia where Jackie Stevens will close the blinds at night due to feeling as though someone is  always looking in their house. She and her husband denied any hallucinations. Performance on a brief cognitive screening instrument (SLUMS) was 20/30. Ultimately, Ms. Hudler was referred for a comprehensive neuropsychological evaluation to characterize her cognitive abilities and to assist with diagnostic clarity and treatment planning.   Brain MRI on 12/20/2019 revealed mild-to-moderate generalized cerebral atrophy without appreciable lobar predominance. No significant white matter disease for her age was reported.  Past Medical History:  Diagnosis Date  . Abnormal cervical cytology 10/25/2012   Follows with Dr Leavy Cella of Gyn  . Allergic rhinitis 11/04/2010   Qualifier: Diagnosis of  By: Annamaria Boots MD, Clinton D   . Anemia 10/06/2013  . Anginal pain    pt has history of CP states had cardiac workup with no specific issues identified   . Arthritis    knees; right thumb; shoulders  . 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  Loss of glenohumeral joint space.  Well-maintained subacromial space.  Injected 09/30/2016 [name] .  Marland Kitchen Asthma, mild intermittent 04/02/2013  . Chicken pox as a child  . Colonic diverticular abscess 11/21/2012  . Constipation   . Decreased hearing   . Dermatitis 03/06/2017  . Diverticulitis 10/25/2012   pt. reports that a drain was placed - 09/2012    . Diverticulitis of rectosigmoid 11/28/2012  . Dry mouth   . Essential hypertension 11/08/2010   Qualifier: Diagnosis of  By: Annamaria Boots MD, Clinton D   . Excessive thirst   . External hemorrhoid, bleeding    "sometimes" (12/19/2012)  . Frequent urination   . GERD (gastroesophageal reflux disease)   . H/O hiatal hernia   . Hand tingling   . Heart murmur   . Hyperglycemia 01/17/2020  . Hyperlipidemia   . Hypothyroidism   . Incontinence   .  Insomnia   . Kidney stones 1970's   "passed on  their own" (12-20-12)  . Low back pain 06/09/2014  . Measles as a child  . Obesity 09/11/2017  . Obstructive sleep apnea 11/04/2010   NPSG [facility] 07/15/10- AHI 13.5/ hr CPAP 10/APS    . Otitis media 01/07/2014  . Palpitations   . Paresthesia 03/23/2015   Left face  . PONV (postoperative nausea and vomiting)   . Rheumatoid arteritis   . Shortness of breath dyspnea    using stairs  . Sinus pain   . Swelling of both lower extremities   . Thyroid cancer 1980's  . Tinnitus   . UTI (urinary tract infection) 04/02/2013  . Vertigo 08/05/2019  . Vitamin D deficiency 02/28/2018    Past Surgical History:  Procedure Laterality Date  . CHOLECYSTECTOMY  1990  . COLON SURGERY    . COLOSTOMY REVISION  12/20/2012   Procedure: COLON RESECTION SIGMOID;  Surgeon: Gwenyth Ober, MD;  Location: [facility];  Service: General;  Laterality: N/A;  . CYSTOSCOPY WITH STENT PLACEMENT  12-20-12   Procedure: CYSTOSCOPY WITH STENT PLACEMENT;  Surgeon: Hanley Ben, MD;  Location: [facility];  Service: Urology;  Laterality: N/A;  . DILATION AND CURETTAGE OF UTERUS  1960's   "lots of them; had miscarriages" (12-20-12)  . LYSIS OF ADHESION N/A 12/02/2015   Procedure: LAPAROSCOPIC LYSIS OF ADHESION;  Surgeon: Johnathan Hausen, MD;  Location: [facility];  Service: General;  Laterality: N/A;  . ROBOTIC ASSISTED BILATERAL SALPINGO OOPHERECTOMY Bilateral 12/02/2015   Procedure: XI ROBOTIC ASSISTED BILATERAL SALPINGO OOPHORECTOMY;  Surgeon: Everitt Amber, MD;  Location: [facility];  Service: Gynecology;  Laterality: Bilateral;  . SIGMOID RESECTION / RECTOPEXY  December 20, 2012  . THYROIDECTOMY, PARTIAL  1988   "then did iodine to remove the rest" (20-Dec-2012)  . TONSILLECTOMY  1951?  . TRANSRECTAL DRAINAGE OF PELVIC ABSCESS  10/27/2012  . VAGINAL HYSTERECTOMY  1970's   "still have my ovaries" (20-Dec-2012)    Current Outpatient Medications:  .  azelastine (ASTELIN) 0.1 % nasal spray, PLACE 2 SPRAYS INTO BOTH NOSTRILS 2 (TWO) TIMES DAILY., Disp: 30 mL, Rfl:  1 .  famotidine (PEPCID) 20 MG tablet, TAKE 2 TABLETS BY MOUTH EVERY DAY, Disp: 180 tablet, Rfl: 1 .  fluticasone (FLONASE) 50 MCG/ACT nasal spray, Allergy Relief (fluticasone) 50 mcg/actuation nasal spray,suspension  Spray 1 spray every day by intranasal route., Disp: , Rfl:  .  gabapentin (NEURONTIN) 100 MG capsule, gabapentin 100 mg capsule  Take 1 capsule 3 times a day by oral route., Disp: , Rfl:  .  hydrochlorothiazide (HYDRODIURIL) 25 MG tablet, TAKE 1 TABLET BY MOUTH EVERY DAY, Disp: 90 tablet, Rfl: 1 .  hyoscyamine (LEVSIN SL) 0.125 MG SL tablet, Place 1 tablet (0.125 mg total) under the tongue every 4 (four) hours as needed., Disp: 30 tablet, Rfl: 1 .  KETOCONAZOLE, TOPICAL, 1 % SHAM, Apply 1 Dose topically once a week., Disp: 200 mL, Rfl: 1 .  levothyroxine (SYNTHROID, LEVOTHROID) 150 MCG tablet, Take 150 mcg by mouth daily before breakfast., Disp: , Rfl:  .  losartan (COZAAR) 100 MG tablet, Take 1 tablet (100 mg total) by mouth at bedtime., Disp: 90 tablet, Rfl: 1 .  meclizine (ANTIVERT) 25 MG tablet, Take 1 tablet (25 mg total) by mouth 3 (three) times daily as needed for dizziness., Disp: 30 tablet, Rfl: 1 .  metoprolol tartrate (LOPRESSOR) 100 MG tablet, Take 1.5 tablets (150 mg total) by mouth 2 (two) times daily., Disp:  90 tablet, Rfl: 2 .  Multiple Vitamins-Minerals (MULTIVITAMIN WOMEN 50+ PO), Take 1 tablet by mouth daily., Disp: , Rfl:  .  naproxen (NAPROSYN) 375 MG tablet, TAKE 1 TABLET BY MOUTH 2 (TWO) TIMES DAILY WITH A MEAL., Disp: 180 tablet, Rfl: 1 .  nitroGLYCERIN (NITROSTAT) 0.4 MG SL tablet, PLACE 1 TABLET UNDER THE TONGUE EVERY 5 MINUTES AS NEEDED FOR CHEST PAIN, Disp: 25 tablet, Rfl: 1 .  NONFORMULARY OR COMPOUNDED ITEM, Antifungal solution: Terbinafine 3%, Fluconazole 2%, Tea Tree Oil 5%, Urea 10%, Ibuprofen 2% in DMSO suspension #7mL, Disp: 1 each, Rfl: 3 .  omeprazole (PRILOSEC) 40 MG capsule, TAKE 1 CAPSULE BY MOUTH TWICE A DAY, Disp: 180 capsule, Rfl: 1 .   prednisoLONE acetate (PRED FORTE) 1 % ophthalmic suspension, PLACE 4 DROPS IN BOTH EARS TWICE DAILY AS NEEDED FOR ITCHING, Disp: , Rfl:  .  simvastatin (ZOCOR) 40 MG tablet, TAKE 1 TABLET BY MOUTH EVERYDAY AT BEDTIME, Disp: 90 tablet, Rfl: 1 .  Vitamin D, Cholecalciferol, 1000 UNITS CAPS, Take 1 capsule by mouth daily., Disp: , Rfl:   Clinical Interview:   Cognitive Symptoms: Decreased short-term memory: Endorsed. Difficulties were said to surround remembering how to cook things and entering a room and forgetting her original intention. She reported a longstanding weakness remembering names. Difficulties were said to first become noticeable approximately 6 months prior and have exhibited a subtle decline over that time. Decreased long-term memory: Denied. Decreased attention/concentration: Endorsed. Her husband reported Ms. Kimbler always having a very active mind which is distracting, causing her to not always pay attention to things being said to her. She acknowledged some trouble with being easily distracted, but not worse than what is typical for her. Reduced processing speed: Denied. Difficulties with executive functions: Denied. Recent personality changes were also denied.  Difficulties with emotion regulation: Denied. Difficulties with receptive language: Denied assuming she can hear the source of the sound appropriately. Difficulties with word finding: Endorsed "sometimes."  Decreased visuoperceptual ability: Denied.  Difficulties completing ADLs: Largely denied. She manages her medications independently. She also continues to manage personal finances, but did express some concern about her performance, especially as tax season is coming up. While she drives rarely, she denied difficulties performing this action.  Additional Medical History: History of traumatic brain injury/concussion: Denied. History of stroke: Denied. History of seizure activity: Denied. History of known exposure to  toxins: Denied. Symptoms of chronic pain: Endorsed. She described ongoing arm/shoulder pain which is aided by chiropractic sessions. She also reported ongoing knee pain and is in need of knee replacement surgery. Pain symptoms were described as manageable overall. Experience of frequent headaches/migraines: Denied. Frequent instances of dizziness/vertigo: Endorsed. Symptoms were said to occur when standing quickly, as well as when performing regular day-to-day activities. These symptoms were at least partially attributed to medication side effects. A history of falls was denied.   Sensory changes: She wears glasses with positive effect. Medical records suggest mild hearing loss. She also reported losing her senses of taste and smell after becoming ill approximately 2 months prior to the COVID-19 pandemic gaining widespread attention. These senses have largely not returned since that time.  Balance/coordination difficulties: Denied. Other motor difficulties: Denied.  Sleep History: Estimated hours obtained each night: 7-8 hours. Difficulties falling asleep: Endorsed. She reported occasional difficulties falling asleep. These were due to her "not feeling tired" rather than the presence of anxious or racing thoughts.  Difficulties staying asleep: Denied. Feels rested and refreshed upon awakening: Endorsed.  History  of snoring: Endorsed. History of waking up gasping for air: Endorsed. Witnessed breath cessation while asleep: Endorsed. She reported a history of obstructive sleep apnea and uses her CPAP machine nightly.   History of vivid dreaming: Denied. Excessive movement while asleep: Denied. Instances of acting out her dreams: Denied.  Psychiatric/Behavioral Health History: Depression: Denied. She described her current mood as "good" and denied a history of mental health concerns. She likewise denied current or remote suicidal ideation, intent, or plan. Anxiety: Denied. Mania:  Denied. Trauma History: Denied. Visual/auditory hallucinations: Denied. Delusional thoughts: Denied. However, she did note the potential for paranoid thoughts, stating that over the past year, she has become more insistent on having the blinds shut in their home once it starts getting dark due to concerns that individuals may be watching them from outside. She denied ever catching someone performing this action.  Tobacco: Denied. Alcohol: She reported very rarely consuming a glass of wine. She denied a history of problematic alcohol abuse or dependence.  Recreational drugs: Denied. Caffeine: 2 cups of coffee in the morning.   Family History: Problem Relation Age of Onset  . Heart disease Father   . Pneumonia Father   . Hypertension Father   . Hyperlipidemia Father   . Cancer Father        skin  . Stroke Father   . Alzheimer's disease Mother   . Heart disease Mother   . Depression Mother   . Emphysema Brother        marijuana and cigarettes  . Alcohol abuse Brother   . Hearing loss Brother   . Diabetes Maternal Grandmother   . Alzheimer's disease Paternal Grandmother   . Cancer Paternal Grandmother        lung?- smoker  . Hyperlipidemia Paternal Grandmother   . Heart attack Paternal Grandfather   . Alcohol abuse Paternal Grandfather   . Neurofibromatosis Son        schwanomatosis  . Neurofibromatosis Son        swanomatosis  . Cancer Paternal Aunt    This information was confirmed by Ms. Kynard.  Academic/Vocational History: Highest level of educational attainment: 12 years. 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 No relative academic weaknesses were reported.  History of developmental delay: Denied. History of grade repetition: Denied. Enrollment in special education courses: Denied. History of LD/ADHD: Denied.  Employment: Retired. She  previously worked for [employer] after graduating from [employer]. 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   Evaluation Results:   Behavioral Observations: Ms. Bodell was accompanied by her husband, arrived to her appointment on time, and was appropriately dressed and groomed. Observed gait and station were within normal limits. Gross motor functioning appeared intact upon informal observation and no abnormal movements (e.g., tremors) were noted. Her affect was generally relaxed and positive, but did range appropriately given the subject being discussed during the clinical interview or the task at hand during testing procedures. Spontaneous speech was fluent and word finding difficulties were not observed during the clinical interview. Thought processes were coherent, organized, and normal in content. Insight into her cognitive difficulties appeared appropriate. During testing, sustained attention was appropriate. Task engagement was adequate and she persisted when challenged. Overall, Ms. Vanderhyde was cooperative with the clinical interview and subsequent  testing procedures.   Adequacy of Effort: The validity of neuropsychological testing is limited by the extent to which the individual being tested may be assumed to have exerted adequate effort during testing. Ms. Wuebben expressed her intention to perform to the best of her abilities and exhibited adequate task engagement and persistence. Scores across stand-alone and embedded performance validity measures were variable, but generally within expectation. One below expectation score was noted across a memory measure and it is possible that this was more related to ongoing cognitive dysfunction, rather than limited engagement in the testing process. As such, the results of the current evaluation are believed to be a valid  representation of Ms. Mcquire's current cognitive functioning.  Test Results: Ms. Padrick was largely oriented at the time of the current evaluation. Points were lost for her stating her incorrect age ("74") and not knowing the current date.  Intellectual abilities based upon educational and vocational attainment were estimated to be in the average range. Premorbid abilities were estimated to be within the average range based upon a single-word reading test.   Processing speed was variable, ranging from the well below average to average normative ranges. Basic attention was well above average. More complex attention (e.g., working memory) was average. Executive functioning was mildly variable, but largely within normal limits. A weakness (well below average range) was exhibited across a verbally mediated cognitive flexibility task.  Assessed receptive language abilities were above average. Likewise, Ms. Murri did not exhibit any difficulties comprehending task instructions and answered all questions asked of her appropriately. Assessed expressive language (e.g., verbal fluency and confrontation naming) was variable. Phonemic fluency was average to above average, semantic fluency was exceptionally low to below average, and confrontation naming was exceptionally low.     Assessed visuospatial/visuoconstructional abilities were within normal limits.    Learning (i.e., encoding) of novel verbal and visual information was exceptionally low to well below average and Ms. Purdom exhibited relatively flat learning curves across all memory assessments. Spontaneous delayed recall (i.e., retrieval) of previously learned information was also exceptionally low to well below average. Retention rates were 22% across a story learning task, 40-60% across a list learning task, and 0% across a shape learning task. Performance across recognition tasks was variable, but generally appropriate across story and shape learning tasks,  suggesting some evidence for information consolidation. A weakness was noted across a list learning recognition task.   Results of emotional screening instruments suggested that recent symptoms of generalized anxiety were in the minimal range, while symptoms of depression were within normal limits. A screening instrument assessing recent sleep quality suggested the presence of minimal sleep dysfunction.  Tables of Scores:   Note: This summary of test scores accompanies the interpretive report and should not be considered in isolation without reference to the appropriate sections in the text. Descriptors are based on appropriate normative data and may be adjusted based on clinical judgment. The terms "impaired" and "within normal limits (WNL)" are used when a more specific level of functioning cannot be determined.       Effort Testing:   DESCRIPTOR       ACS Word Choice: --- --- Within Expectation    *Based on 74 y/o norms     Dot Counting Test: --- --- Within Expectation  CVLT-III Forced Choice Recognition: --- --- Below Expectation       Orientation:      Raw Score Percentile   NAB Orientation, Form 1 26/29 --- ---  Intellectual Functioning:           Standard Score Percentile   Test of Premorbid Functioning: 99 47 Average       Memory:          Wechsler Memory Scale (WMS-IV):                       Raw Score (Scaled Score) Percentile     Logical Memory I 15/53 (5) 5 Well Below Average    Logical Memory II 2/39 (4) 2 Well Below Average    Logical Memory Recognition 15/23 17-25 Below Average       [place] Verbal Learning Test (CVLT-III) Brief Form: Raw Score (Scaled/Standard Score) Percentile     Total Trials 1-4 16/36 (66) 1 Exceptionally Low    Short-Delay Free Recall 3/9 (2) <1 Exceptionally Low    Long-Delay Free Recall 2/9 (3) 1 Exceptionally Low    Long-Delay Cued Recall 3/9 (3) 1 Exceptionally Low      Recognition Hits 7/9 (7) 16 Below Average      False  Positive Errors 5 (2) <1 Exceptionally Low       Brief Visuospatial Memory Test (BVMT-R), Form 1: Raw Score (T Score) Percentile     Total Trials 1-3 5/36 (24) <1 Exceptionally Low    Delayed Recall 0/12 (20) <1 Exceptionally Low    Recognition Discrimination Index 5 >16 Within Normal Limits      Recognition Hits 6/6 >16 Within Normal Limits      False Positive Errors 1 11-16 Below Average        Attention/Executive Function:          Trail Making Test (TMT): Raw Score (T Score) Percentile     Part A 70 secs.,  0 errors (34) 5 Well Below Average    Part B 131 secs.,  0 errors (43) 25 Average         Scaled Score Percentile   WAIS-IV Coding: 8 25 Average       NAB Attention Module, Form 1: T Score Percentile     Digits Forward 64 92 Well Above Average    Digits Backwards 52 58 Average       D-KEFS Color-Word Interference Test: Raw Score (Scaled Score) Percentile     Color Naming 47 secs. (5) 5 Well Below Average    Word Reading 29 secs. (8) 25 Average    Inhibition 95 secs. (7) 16 Below Average      Total Errors 3 errors (10) 50 Average    Inhibition/Switching 94 secs. (8) 25 Average      Total Errors 2 errors (11) 63 Average       D-KEFS Verbal Fluency Test: Raw Score (Scaled Score) Percentile     Letter Total Correct 41 (12) 75 Above Average    Category Total Correct 22 (6) 9 Below Average    Category Switching Total Correct 8 (5) 5 Well Below Average    Category Switching Accuracy 7 (6) 9 Below Average      Total Set Loss Errors 1 (11) 63 Average      Total Repetition Errors 0 (13) 84 Above Average       D-KEFS 20 Questions Test: Scaled Score Percentile     Total Weighted Achievement Score 10 50 Average    Initial Abstraction Score 12 75 Above Average       Language:          Verbal Fluency Test: Raw  Score (T Score) Percentile     Phonemic Fluency (FAS) 41 (51) 54 Average    Animal Fluency 8 (25) 1 Exceptionally Low  *From D-KEFS Verbal Fluency           NAB  Language Module, Form 1: T Score Percentile     Auditory Comprehension 58 79 Above Average    Naming 20/31 (22) <1 Exceptionally Low       Visuospatial/Visuoconstruction:      Raw Score Percentile   Clock Drawing: 8/10 --- Within Normal Limits       NAB Spatial Module, Form 1: T Score Percentile     Figure Drawing Copy 52 58 Average        Scaled Score Percentile   WAIS-IV Matrix Reasoning: 9 37 Average       Mood and Personality:      Raw Score Percentile   Geriatric Depression Scale: 5 --- Within Normal Limits  Geriatric Anxiety Scale: 0 --- Minimal    Somatic 0 --- Minimal    Cognitive 0 --- Minimal    Affective 0 --- Minimal       Additional Questionnaires:      Raw Score Percentile   PROMIS Sleep Disturbance Questionnaire: 12 --- None to Slight   Informed Consent and Coding/Compliance:   Ms. Robbie was provided with a verbal description of the nature and purpose of the present neuropsychological evaluation. Also reviewed were the foreseeable risks and/or discomforts and benefits of the procedure, limits of confidentiality, and mandatory reporting requirements of this provider. The patient was given the opportunity to ask questions and receive answers about the evaluation. Oral consent to participate was provided by the patient.   This evaluation was conducted by Christia Reading, Ph.D., licensed clinical neuropsychologist. Ms. Kalajian completed a comprehensive clinical interview with Dr. Melvyn Novas, billed as one unit [phone], and 135 minutes of cognitive testing and scoring, billed as one unit [phone] and four additional units [id number]. Psychometrist Milana Kidney, B.S., assisted Dr. Melvyn Novas with test administration and scoring procedures. As a separate and discrete service, Dr. Melvyn Novas spent a total of 180 minutes in interpretation and report writing billed as one unit [phone] and two units [id number].

## 2020-02-05 ENCOUNTER — Encounter: Payer: Self-pay | Admitting: Psychology

## 2020-02-05 ENCOUNTER — Other Ambulatory Visit: Payer: Self-pay | Admitting: Family Medicine

## 2020-02-05 DIAGNOSIS — G3184 Mild cognitive impairment, so stated: Secondary | ICD-10-CM

## 2020-02-05 DIAGNOSIS — F0394 Unspecified dementia, unspecified severity, with anxiety: Secondary | ICD-10-CM | POA: Insufficient documentation

## 2020-02-05 HISTORY — DX: Mild cognitive impairment of uncertain or unknown etiology: G31.84

## 2020-02-12 ENCOUNTER — Other Ambulatory Visit (INDEPENDENT_AMBULATORY_CARE_PROVIDER_SITE_OTHER): Payer: Medicare HMO

## 2020-02-12 ENCOUNTER — Other Ambulatory Visit: Payer: Self-pay

## 2020-02-12 DIAGNOSIS — E782 Mixed hyperlipidemia: Secondary | ICD-10-CM

## 2020-02-12 DIAGNOSIS — I10 Essential (primary) hypertension: Secondary | ICD-10-CM | POA: Diagnosis not present

## 2020-02-12 DIAGNOSIS — E038 Other specified hypothyroidism: Secondary | ICD-10-CM

## 2020-02-12 DIAGNOSIS — R739 Hyperglycemia, unspecified: Secondary | ICD-10-CM

## 2020-02-12 LAB — LDL CHOLESTEROL, DIRECT: Direct LDL: 81 mg/dL

## 2020-02-12 LAB — COMPREHENSIVE METABOLIC PANEL
ALT: 26 U/L (ref 0–35)
AST: 25 U/L (ref 0–37)
Albumin: 3.9 g/dL (ref 3.5–5.2)
Alkaline Phosphatase: 74 U/L (ref 39–117)
BUN: 19 mg/dL (ref 6–23)
CO2: 30 mEq/L (ref 19–32)
Calcium: 8.7 mg/dL (ref 8.4–10.5)
Chloride: 98 mEq/L (ref 96–112)
Creatinine, Ser: 0.73 mg/dL (ref 0.40–1.20)
GFR: 77.61 mL/min (ref >60.00)
Glucose, Bld: 93 mg/dL (ref 70–99)
Potassium: 4.1 mEq/L (ref 3.5–5.1)
Sodium: 136 mEq/L (ref 135–145)
Total Bilirubin: 0.4 mg/dL (ref 0.2–1.2)
Total Protein: 6.3 g/dL (ref 6.0–8.3)

## 2020-02-12 LAB — CBC
HCT: 37.8 % (ref 36.0–46.0)
Hemoglobin: 12.9 g/dL (ref 12.0–15.0)
MCHC: 34.3 g/dL (ref 30.0–36.0)
MCV: 90.5 fl (ref 78.0–100.0)
Platelets: 258 10*3/uL (ref 150.0–400.0)
RBC: 4.17 Mil/uL (ref 3.87–5.11)
RDW: 13.1 % (ref 11.5–15.5)
WBC: 7.1 10*3/uL (ref 4.0–10.5)

## 2020-02-12 LAB — HEMOGLOBIN A1C: Hgb A1c MFr Bld: 5.6 % (ref 4.6–6.5)

## 2020-02-12 LAB — TSH: TSH: 1.07 u[IU]/mL (ref 0.35–4.50)

## 2020-02-12 LAB — LIPID PANEL
Cholesterol: 158 mg/dL (ref 0–200)
HDL: 47.6 mg/dL (ref >39.00)
NonHDL: 110.12
Total CHOL/HDL Ratio: 3
Triglycerides: 273 mg/dL — ABNORMAL HIGH (ref 0.0–149.0)
VLDL: 54.6 mg/dL — ABNORMAL HIGH (ref 0.0–40.0)

## 2020-02-13 ENCOUNTER — Other Ambulatory Visit: Payer: Self-pay

## 2020-02-13 ENCOUNTER — Encounter: Payer: Self-pay | Admitting: Psychology

## 2020-02-13 ENCOUNTER — Ambulatory Visit (INDEPENDENT_AMBULATORY_CARE_PROVIDER_SITE_OTHER): Payer: Medicare HMO | Admitting: Psychology

## 2020-02-13 DIAGNOSIS — E782 Mixed hyperlipidemia: Secondary | ICD-10-CM

## 2020-02-13 DIAGNOSIS — G3184 Mild cognitive impairment, so stated: Secondary | ICD-10-CM

## 2020-02-13 MED ORDER — ROSUVASTATIN CALCIUM 20 MG PO TABS: 40.0000 mg | ORAL_TABLET | Freq: Every day | ORAL | 3 refills | Status: DC

## 2020-02-13 NOTE — Progress Notes (Signed)
   Neuropsychology Feedback Session Jackie Stevens. [facility]  Reason for Referral:   Jackie Stevens a 74 y.o. right-handed Caucasian female referred by Jackie Stevens, M.D.,to characterize hercurrent cognitive functioning and assist with diagnostic clarity and treatment planning in the context of subjective cognitive decline and a family history of Alzheimer's disease.  Feedback:   Jackie Stevens completed a comprehensive neuropsychological evaluation on 02/04/2020. Please refer to that encounter for the full report and recommendations. Briefly, results suggested primary impairments surrounding new learning and memory, semantic fluency, and confrontation naming. Mild variability was also exhibited across processing speed and executive functioning. The etiology of her current deficits is unclear currently. Despite this, her pattern of performance across cognitive testing is somewhat concerning for the early stages of Alzheimer's disease. Across memory measures, she exhibited flat learning curves and difficulties retrieving this information after lengthy delays. Impairments in confrontation naming and a large discrepancy between semantic and phonemic fluency scores is also consistent with this presentation. However, despite exhibited variability, consolidation scores across story and shape learning tasks were largely appropriate and not wholly suggestive of a memory storage deficit, which is inconsistent with what is expected in Alzheimer's disease.  Jackie Stevens was accompanied by her husband on the current telephone call. Content of the current session focused on the results of her neuropsychological evaluation. Jackie Stevens and her husband were given the opportunity to ask questions and their questions were answered. They were was encouraged to reach out should additional questions arise.     Less than 16 minutes were spent conducting the current feedback session with  Jackie Stevens and her husband.

## 2020-02-18 DIAGNOSIS — G4733 Obstructive sleep apnea (adult) (pediatric): Secondary | ICD-10-CM | POA: Diagnosis not present

## 2020-02-26 ENCOUNTER — Telehealth: Payer: Self-pay | Admitting: Family Medicine

## 2020-02-26 NOTE — Telephone Encounter (Signed)
  rosuvastatin (CRESTOR) 20 MG tablet [id number]    Pt states that Pharmacy will not fill medication. It's not covered in their formulary . Please advise

## 2020-02-27 ENCOUNTER — Other Ambulatory Visit: Payer: Self-pay

## 2020-02-27 ENCOUNTER — Encounter: Payer: Self-pay | Admitting: Podiatry

## 2020-02-27 ENCOUNTER — Ambulatory Visit: Payer: Medicare HMO | Admitting: Podiatry

## 2020-02-27 VITALS — Temp 97.7°F

## 2020-02-27 DIAGNOSIS — M79672 Pain in left foot: Secondary | ICD-10-CM

## 2020-02-27 DIAGNOSIS — G4733 Obstructive sleep apnea (adult) (pediatric): Secondary | ICD-10-CM | POA: Diagnosis not present

## 2020-02-27 DIAGNOSIS — M79671 Pain in right foot: Secondary | ICD-10-CM

## 2020-02-27 DIAGNOSIS — L84 Corns and callosities: Secondary | ICD-10-CM

## 2020-02-27 DIAGNOSIS — M79676 Pain in unspecified toe(s): Secondary | ICD-10-CM

## 2020-02-27 DIAGNOSIS — Q828 Other specified congenital malformations of skin: Secondary | ICD-10-CM

## 2020-02-27 DIAGNOSIS — B351 Tinea unguium: Secondary | ICD-10-CM | POA: Diagnosis not present

## 2020-02-27 NOTE — Patient Instructions (Signed)

## 2020-02-28 ENCOUNTER — Other Ambulatory Visit: Payer: Self-pay | Admitting: Family Medicine

## 2020-02-28 MED ORDER — ROSUVASTATIN CALCIUM 40 MG PO TABS: 40.0000 mg | ORAL_TABLET | Freq: Every day | ORAL | 3 refills | Status: DC

## 2020-02-28 NOTE — Telephone Encounter (Signed)
Dr. Charlett Blake- Pt's insurance doesn't cover rosuvastatin 20mg  2 tablets daily- okay to increase to rosuvastatin 40mg ?

## 2020-02-28 NOTE — Telephone Encounter (Signed)
I sent in the Rosuvastatin 40 mg tabs. Please let her know

## 2020-02-29 NOTE — Telephone Encounter (Signed)
Mychart message sent.

## 2020-03-03 NOTE — Progress Notes (Signed)
Subjective: Jackie Stevens presents today for follow up of corn(s) left 4th, right 2nd digit  which interfere(s) with ambulation. Aggravating factors include wearing enclosed shoe gear. Pain is relieved with periodic professional debridement. and painful plantar lesions submet head 2, 4 right foot.  Pain prevent comfortable ambulation. Aggravating factor is weightbearing with or without shoegear.   Allergies  Allergen Reactions  . Neomycin-Bacitracin Zn-Polymyx Rash    Polysporin- is tolerated   . Niacin Other (See Comments) and Cough    "cough til I threw up" (12/19/2012)  . Ciprofloxacin Hives    Got cipro and flagyl at same time, localized hives to IV arm  . Flagyl [Metronidazole] Hives    Got cipro and flagyl at same time, localized hives to IV arm     Objective: Vitals:   02/27/20 1057  Temp: 97.7 F (36.5 C)    Pt 74 y.o. year old Caucasian female  in NAD. AAO x 3.   Vascular Examination:  Capillary fill time to digits <3 seconds b/l. Palpable DP pulses b/l. Palpable PT pulses b/l. Pedal hair present b/l. Skin temperature gradient within normal limits b/l. No pain with calf compression b/l.  Dermatological Examination: Pedal skin with normal turgor, texture and tone bilaterally. No open wounds bilaterally. No interdigital macerations bilaterally. Toenails 1-5 b/l elongated, dystrophic, thickened, crumbly with subungual debris and tenderness to dorsal palpation. Hyperkeratotic lesion(s) left 4th digit, right 2nd digit.  No erythema, no edema, no drainage, no flocculence. Porokeratotic lesion(s) submet head 4 right foot, submet head 2 right foot. No erythema, no edema, no drainage, no flocculence.  Musculoskeletal: Normal muscle strength 5/5 to all lower extremity muscle groups bilaterally, no pain crepitus or joint limitation noted with ROM b/l, bunion deformity noted b/l and hammertoes noted to the  L 2nd toe and R 2nd toe  Neurological: Protective sensation intact 5/5 intact  bilaterally with 10g monofilament b/l Vibratory sensation intact b/l  Assessment: 1. Pain due to onychomycosis of toenail   2. Porokeratosis   3. Corns   4. Pain in both feet    Plan: -Toenails 1-5 b/l were debrided in length and girth with sterile nail nippers and dremel without iatrogenic bleeding.  -Corn(s) debrided left 4th toe, right 2nd toe without complication or incident. Total number debrided=2. -Painful porokeratotic lesion(s) submet heads 2, 4 right foot pared and enucleated with sterile scalpel blade without incident. -Patient to continue soft, supportive shoe gear daily. -Patient to report any pedal injuries to medical professional immediately. -Patient/POA to call should there be question/concern in the interim.  Return in about 9 weeks (around 04/30/2020) for nail and callus trim.

## 2020-03-13 ENCOUNTER — Other Ambulatory Visit: Payer: Self-pay | Admitting: Family Medicine

## 2020-03-15 ENCOUNTER — Other Ambulatory Visit: Payer: Self-pay | Admitting: Family Medicine

## 2020-03-20 DIAGNOSIS — G4733 Obstructive sleep apnea (adult) (pediatric): Secondary | ICD-10-CM | POA: Diagnosis not present

## 2020-03-25 ENCOUNTER — Telehealth: Payer: Self-pay | Admitting: Family Medicine

## 2020-03-25 NOTE — Telephone Encounter (Signed)
Patient needs appointment.    Left message on machine to call back to schedule.  Jackie Stevens schedule is full for this week can schedule with another provider.

## 2020-03-25 NOTE — Telephone Encounter (Signed)
Caller : Mirabella Muenzer  Call Back # [phone]  Concern : Patient believes that she has a yeast infection and would like a medication sent in to the Pharmacy.  Please advise

## 2020-03-27 ENCOUNTER — Encounter: Payer: Self-pay | Admitting: Family Medicine

## 2020-03-27 ENCOUNTER — Other Ambulatory Visit: Payer: Self-pay

## 2020-03-27 ENCOUNTER — Ambulatory Visit (INDEPENDENT_AMBULATORY_CARE_PROVIDER_SITE_OTHER): Payer: Medicare HMO | Admitting: Family Medicine

## 2020-03-27 VITALS — BP 140/80 | HR 60 | Temp 97.5°F | Resp 18 | Ht 63.0 in | Wt 210.4 lb

## 2020-03-27 DIAGNOSIS — R3129 Other microscopic hematuria: Secondary | ICD-10-CM | POA: Diagnosis not present

## 2020-03-27 DIAGNOSIS — R350 Frequency of micturition: Secondary | ICD-10-CM

## 2020-03-27 LAB — POC URINALSYSI DIPSTICK (AUTOMATED)
Bilirubin, UA: NEGATIVE
Blood, UA: POSITIVE
Glucose, UA: NEGATIVE
Ketones, UA: NEGATIVE
Leukocytes, UA: NEGATIVE
Nitrite, UA: NEGATIVE
Protein, UA: NEGATIVE
Spec Grav, UA: 1.02 (ref 1.010–1.025)
Urobilinogen, UA: 0.2 E.U./dL
pH, UA: 6 (ref 5.0–8.0)

## 2020-03-27 MED ORDER — CEPHALEXIN 500 MG PO CAPS: 500.0000 mg | ORAL_CAPSULE | Freq: Two times a day (BID) | ORAL | 0 refills | Status: DC

## 2020-03-27 NOTE — Progress Notes (Signed)
Patient ID: Jackie Stevens, female    DOB: 1946-01-11  Age: 74 y.o. MRN: [id number]    Subjective:  Subjective  HPI Jackie Stevens presents for urinary frequency and dysuria   No fever   Review of Systems  Constitutional: Negative for appetite change, diaphoresis, fatigue and unexpected weight change.  Eyes: Negative for pain, redness and visual disturbance.  Respiratory: Negative for cough, chest tightness, shortness of breath and wheezing.   Cardiovascular: Negative for chest pain, palpitations and leg swelling.  Gastrointestinal: Negative for abdominal distention, abdominal pain, nausea, rectal pain and vomiting.  Endocrine: Negative for cold intolerance, heat intolerance, polydipsia, polyphagia and polyuria.  Genitourinary: Positive for dysuria and frequency. Negative for decreased urine volume, difficulty urinating, flank pain, pelvic pain, vaginal bleeding, vaginal discharge and vaginal pain.  Neurological: Negative for dizziness, light-headedness, numbness and headaches.    History Past Medical History:  Diagnosis Date  . Abnormal cervical cytology 10/25/2012   Follows with Dr Leavy Cella of Gyn  . Allergic rhinitis 11/04/2010   Qualifier: Diagnosis of  By: Annamaria Boots MD, Clinton D   . Anemia 10/06/2013  . Anginal pain    pt has history of CP states had cardiac workup with no specific issues identified   . Arthritis    knees; right thumb; shoulders  . 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  Loss of glenohumeral joint space.  Well-maintained subacromial space.  Injected 09/30/2016 [name] .  Marland Kitchen Asthma, mild intermittent 04/02/2013  . Chicken pox as a child  . Colonic diverticular abscess 11/21/2012  . Constipation   . Decreased hearing   . Dermatitis 03/06/2017  . Diverticulitis 10/25/2012   pt. reports that a drain was placed - 09/2012      . Diverticulitis of rectosigmoid 11/28/2012  . Dry mouth   . Essential hypertension 11/08/2010   Qualifier: Diagnosis of  By: Annamaria Boots MD, Clinton D   . Excessive thirst   . External hemorrhoid, bleeding    "sometimes" (01/02/2013)  . Frequent urination   . GERD (gastroesophageal reflux disease)   . H/O hiatal hernia   . Hand tingling   . Heart murmur   . Hyperglycemia 01/17/2020  . Hyperlipidemia   . Hypothyroidism   . Incontinence   . Insomnia   . Kidney stones 1970's   "passed on their own" (2013-01-02)  . Low back pain 06/09/2014  . Measles as a child  . 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 Obesity 09/11/2017  . Obstructive sleep apnea 11/04/2010   NPSG [facility] 07/15/10- AHI 13.5/ hr CPAP 10/APS    . Otitis media 01/07/2014  . Palpitations   . Paresthesia 03/23/2015   Left face  . PONV (postoperative nausea and vomiting)   . Rheumatoid arteritis   . Shortness of breath dyspnea    using stairs  . Sinus pain   . Swelling of both lower extremities   . Thyroid cancer 1980's  . Tinnitus   . UTI (urinary tract infection) 04/02/2013  . Vertigo 08/05/2019  . Vitamin D deficiency 02/28/2018    She has a past surgical history that includes Thyroidectomy, partial (1988); Sigmoid resection / rectopexy (2013/01/02); Colon surgery; Tonsillectomy (1951?); Cholecystectomy (1990); Vaginal hysterectomy (1970's); Dilation and curettage of uterus (1960's); Transrectal drainage of pelvic abscess (10/27/2012); Colostomy revision (01/02/2013); Cystoscopy with stent placement (January 02, 2013); Lysis of adhesion (N/A, 12/02/2015); and Robotic assisted bilateral salpingo oophorectomy (  Bilateral, 12/02/2015).   Her family history includes Alcohol abuse in her brother and paternal grandfather; Alzheimer's disease in her mother and paternal grandmother; Cancer in her father, paternal aunt, and paternal grandmother; Depression in her mother; Diabetes in her maternal grandmother; Emphysema in her brother; Hearing loss in  her brother; Heart attack in her paternal grandfather; Heart disease in her father and mother; Hyperlipidemia in her father and paternal grandmother; Hypertension in her father; Neurofibromatosis in her son and son; Pneumonia in her father; Stroke in her father.She reports that she has never smoked. She has never used smokeless tobacco. She reports that she does not drink alcohol or use drugs.  Current Outpatient Medications on File Prior to Visit  Medication Sig Dispense Refill  . azelastine (ASTELIN) 0.1 % nasal spray PLACE 2 SPRAYS INTO BOTH NOSTRILS 2 (TWO) TIMES DAILY. 30 mL 1  . famotidine (PEPCID) 20 MG tablet TAKE 2 TABLETS BY MOUTH EVERY DAY 180 tablet 1  . fluticasone (FLONASE) 50 MCG/ACT nasal spray Allergy Relief (fluticasone) 50 mcg/actuation nasal spray,suspension  Spray 1 spray every day by intranasal route.    . gabapentin (NEURONTIN) 100 MG capsule gabapentin 100 mg capsule  Take 1 capsule 3 times a day by oral route.    . hydrochlorothiazide (HYDRODIURIL) 25 MG tablet TAKE 1 TABLET BY MOUTH EVERY DAY 90 tablet 1  . hyoscyamine (LEVSIN SL) 0.125 MG SL tablet Place 1 tablet (0.125 mg total) under the tongue every 4 (four) hours as needed. 30 tablet 1  . KETOCONAZOLE, TOPICAL, 1 % SHAM Apply 1 Dose topically once a week. 200 mL 1  . levothyroxine (SYNTHROID, LEVOTHROID) 150 MCG tablet Take 150 mcg by mouth daily before breakfast.    . losartan (COZAAR) 100 MG tablet TAKE 1 TABLET BY MOUTH EVERYDAY AT BEDTIME 90 tablet 1  . meclizine (ANTIVERT) 25 MG tablet Take 1 tablet (25 mg total) by mouth 3 (three) times daily as needed for dizziness. 30 tablet 1  . metoprolol tartrate (LOPRESSOR) 100 MG tablet TAKE 1.5 TABLETS (150 MG TOTAL) BY MOUTH 2 (TWO) TIMES DAILY. 270 tablet 1  . Multiple Vitamins-Minerals (MULTIVITAMIN WOMEN 50+ PO) Take 1 tablet by mouth daily.    . naproxen (NAPROSYN) 375 MG tablet TAKE 1 TABLET BY MOUTH 2 (TWO) TIMES DAILY WITH A MEAL. 180 tablet 1  . nitroGLYCERIN  (NITROSTAT) 0.4 MG SL tablet PLACE 1 TABLET UNDER THE TONGUE EVERY 5 MINUTES AS NEEDED FOR CHEST PAIN 25 tablet 1  . NONFORMULARY OR COMPOUNDED ITEM Antifungal solution: Terbinafine 3%, Fluconazole 2%, Tea Tree Oil 5%, Urea 10%, Ibuprofen 2% in DMSO suspension #52mL 1 each 3  . omeprazole (PRILOSEC) 40 MG capsule TAKE 1 CAPSULE BY MOUTH TWICE A DAY 180 capsule 1  . prednisoLONE acetate (PRED FORTE) 1 % ophthalmic suspension PLACE 4 DROPS IN BOTH EARS TWICE DAILY AS NEEDED FOR ITCHING    . rosuvastatin (CRESTOR) 40 MG tablet Take 1 tablet (40 mg total) by mouth daily. 30 tablet 3  . Vitamin D, Cholecalciferol, 1000 UNITS CAPS Take 1 capsule by mouth daily.     No current facility-administered medications on file prior to visit.     Objective:  Objective  Physical Exam Vitals and nursing note reviewed.  Constitutional:      Appearance: She is well-developed.  HENT:     Head: Normocephalic and atraumatic.  Eyes:     Conjunctiva/sclera: Conjunctivae normal.  Neck:     Thyroid: No thyromegaly.     Vascular: No  carotid bruit or JVD.  Cardiovascular:     Rate and Rhythm: Normal rate and regular rhythm.     Heart sounds: Normal heart sounds. No murmur.  Pulmonary:     Effort: Pulmonary effort is normal. No respiratory distress.     Breath sounds: Normal breath sounds. No wheezing or rales.  Chest:     Chest wall: No tenderness.  Abdominal:     General: There is no distension.     Tenderness: There is no abdominal tenderness. There is no right CVA tenderness, left CVA tenderness, guarding or rebound.  Musculoskeletal:     Cervical back: Normal range of motion and neck supple.  Neurological:     Mental Status: She is alert and oriented to person, place, and time.    BP 140/80 (BP Location: Right Arm, Patient Position: Sitting, Cuff Size: Large)   Pulse 60   Temp (!) 97.5 F (36.4 C) (Temporal)   Resp 18   Ht 5\' 3"  (1.6 m)   Wt 210 lb 6.4 oz (95.4 kg)   SpO2 96%   BMI 37.27 kg/m   Wt Readings from Last 3 Encounters:  03/27/20 210 lb 6.4 oz (95.4 kg)  01/17/20 208 lb (94.3 kg)  12/17/19 203 lb 3.2 oz (92.2 kg)     Lab Results  Component Value Date   WBC 7.1 02/12/2020   HGB 12.9 02/12/2020   HCT 37.8 02/12/2020   PLT 258.0 02/12/2020   GLUCOSE 93 02/12/2020   CHOL 158 02/12/2020   TRIG 273.0 (H) 02/12/2020   HDL 47.60 02/12/2020   LDLDIRECT 81.0 02/12/2020   LDLCALC 73 08/02/2019   ALT 26 02/12/2020   AST 25 02/12/2020   NA 136 02/12/2020   K 4.1 02/12/2020   CL 98 02/12/2020   CREATININE 0.73 02/12/2020   BUN 19 02/12/2020   CO2 30 02/12/2020   TSH 1.07 02/12/2020   INR 0.99 03/16/2015   HGBA1C 5.6 02/12/2020    MR BRAIN WO CONTRAST  Result Date: 12/20/2019 CLINICAL DATA:  Memory loss. Additional history provided: Memory loss for 6 months. EXAM: MRI HEAD WITHOUT CONTRAST TECHNIQUE: Multiplanar, multiecho pulse sequences of the brain and surrounding structures were obtained without intravenous contrast. COMPARISON:  Head CT 07/17/2019, brain MRI 03/16/2015 FINDINGS: Brain: There is no evidence of acute infarct. No evidence of intracranial mass. No midline shift or extra-axial fluid collection. No chronic intracranial blood products. No significant white matter disease for age. Mild-to-moderate generalized cerebral atrophy without appreciable lobar predominance Prominence of the lateral and third ventricles has slightly increased from MRI 03/16/2015, and is favored secondary to parenchymal volume loss. Vascular: Flow voids maintained within the proximal large arterial vessels. Skull and upper cervical spine: No focal suspicious marrow lesion. Sinuses/Orbits: Visualized orbits demonstrate no acute abnormality. Minimal ethmoid sinus mucosal thickening. No significant mastoid effusion. IMPRESSION: Mild-to-moderate generalized cerebral atrophy without appreciable lobar predominance. Prominence of the lateral and third ventricles has slightly increased since MRI  03/16/2015, and is favored secondary to parenchymal volume loss. Electronically Signed   By: [name] DO   On: 12/20/2019 12:02     Assessment & Plan:  Plan  I am having Jackie Stevens start on cephALEXin. I am also having her maintain her Vitamin D (Cholecalciferol), Multiple Vitamins-Minerals (MULTIVITAMIN WOMEN 50+ PO), KETOCONAZOLE (TOPICAL), levothyroxine, nitroGLYCERIN, hyoscyamine, meclizine, prednisoLONE acetate, NONFORMULARY OR COMPOUNDED ITEM, azelastine, fluticasone, gabapentin, omeprazole, famotidine, hydrochlorothiazide, naproxen, rosuvastatin, losartan, and metoprolol tartrate.  Meds ordered this encounter  Medications  . cephALEXin (  KEFLEX) 500 MG capsule    Sig: Take 1 capsule (500 mg total) by mouth 2 (two) times daily.    Dispense:  14 capsule    Refill:  0    Problem List Items Addressed This Visit    None    Visit Diagnoses    Urinary frequency    -  Primary   Relevant Medications   cephALEXin (KEFLEX) 500 MG capsule   Other Relevant Orders   POCT Urinalysis Dipstick (Automated) (Completed)      Culture pending  Call or rto prn   Follow-up: Return in about 2 weeks (around 04/10/2020) for repeat urine.  Ann Held, DO

## 2020-03-27 NOTE — Patient Instructions (Signed)
Urinary Tract Infection, Adult A urinary tract infection (UTI) is an infection of any part of the urinary tract. The urinary tract includes:  The kidneys.  The ureters.  The bladder.  The urethra. These organs make, store, and get rid of pee (urine) in the body. What are the causes? This is caused by germs (bacteria) in your genital area. These germs grow and cause swelling (inflammation) of your urinary tract. What increases the risk? You are more likely to develop this condition if:  You have a small, thin tube (catheter) to drain pee.  You cannot control when you pee or poop (incontinence).  You are female, and: ? You use these methods to prevent pregnancy:  A medicine that kills sperm (spermicide).  A device that blocks sperm (diaphragm). ? You have low levels of a female hormone (estrogen). ? You are pregnant.  You have genes that add to your risk.  You are sexually active.  You take antibiotic medicines.  You have trouble peeing because of: ? A prostate that is bigger than normal, if you are female. ? A blockage in the part of your body that drains pee from the bladder (urethra). ? A kidney stone. ? A nerve condition that affects your bladder (neurogenic bladder). ? Not getting enough to drink. ? Not peeing often enough.  You have other conditions, such as: ? Diabetes. ? A weak disease-fighting system (immune system). ? Sickle cell disease. ? Gout. ? Injury of the spine. What are the signs or symptoms? Symptoms of this condition include:  Needing to pee right away (urgently).  Peeing often.  Peeing small amounts often.  Pain or burning when peeing.  Blood in the pee.  Pee that smells bad or not like normal.  Trouble peeing.  Pee that is cloudy.  Fluid coming from the vagina, if you are female.  Pain in the belly or lower back. Other symptoms include:  Throwing up (vomiting).  No urge to eat.  Feeling mixed up (confused).  Being tired  and grouchy (irritable).  A fever.  Watery poop (diarrhea). How is this treated? This condition may be treated with:  Antibiotic medicine.  Other medicines.  Drinking enough water. Follow these instructions at home:  Medicines  Take over-the-counter and prescription medicines only as told by your doctor.  If you were prescribed an antibiotic medicine, take it as told by your doctor. Do not stop taking it even if you start to feel better. General instructions  Make sure you: ? Pee until your bladder is empty. ? Do not hold pee for a long time. ? Empty your bladder after sex. ? Wipe from front to back after pooping if you are a female. Use each tissue one time when you wipe.  Drink enough fluid to keep your pee pale yellow.  Keep all follow-up visits as told by your doctor. This is important. Contact a doctor if:  You do not get better after 1-2 days.  Your symptoms go away and then come back. Get help right away if:  You have very bad back pain.  You have very bad pain in your lower belly.  You have a fever.  You are sick to your stomach (nauseous).  You are throwing up. Summary  A urinary tract infection (UTI) is an infection of any part of the urinary tract.  This condition is caused by germs in your genital area.  There are many risk factors for a UTI. These include having a small, thin   tube to drain pee and not being able to control when you pee or poop.  Treatment includes antibiotic medicines for germs.  Drink enough fluid to keep your pee pale yellow. This information is not intended to replace advice given to you by your health care provider. Make sure you discuss any questions you have with your health care provider. Document Revised: 11/16/2018 Document Reviewed: 06/08/2018 Elsevier Patient Education  2020 [employer]  

## 2020-03-28 DIAGNOSIS — R3129 Other microscopic hematuria: Secondary | ICD-10-CM | POA: Diagnosis not present

## 2020-03-28 NOTE — Addendum Note (Signed)
Addended by: Kelle Darting A on: 03/28/2020 08:53 AM   Modules accepted: Orders

## 2020-03-30 LAB — URINE CULTURE
MICRO NUMBER:: 10372884
SPECIMEN QUALITY:: ADEQUATE

## 2020-03-31 ENCOUNTER — Other Ambulatory Visit: Payer: Self-pay | Admitting: Family Medicine

## 2020-04-08 ENCOUNTER — Encounter: Payer: Self-pay | Admitting: Family Medicine

## 2020-04-08 ENCOUNTER — Other Ambulatory Visit: Payer: Self-pay

## 2020-04-08 ENCOUNTER — Ambulatory Visit (INDEPENDENT_AMBULATORY_CARE_PROVIDER_SITE_OTHER): Payer: Medicare HMO | Admitting: Family Medicine

## 2020-04-08 VITALS — BP 140/78 | HR 60 | Temp 97.9°F | Resp 18 | Ht 63.0 in | Wt 207.2 lb

## 2020-04-08 DIAGNOSIS — N390 Urinary tract infection, site not specified: Secondary | ICD-10-CM

## 2020-04-08 DIAGNOSIS — R350 Frequency of micturition: Secondary | ICD-10-CM

## 2020-04-08 LAB — POCT URINALYSIS DIPSTICK OB
Bilirubin, UA: NEGATIVE
Blood, UA: NEGATIVE
Glucose, UA: NEGATIVE
Ketones, UA: NEGATIVE
Nitrite, UA: NEGATIVE
Spec Grav, UA: 1.015 (ref 1.010–1.025)
Urobilinogen, UA: 0.2 E.U./dL
pH, UA: 6.5 (ref 5.0–8.0)

## 2020-04-08 MED ORDER — CEFTRIAXONE SODIUM 1 G IJ SOLR
1.0000 g | Freq: Once | INTRAMUSCULAR | Status: AC
  Administered 2020-04-08: 1 g via INTRAMUSCULAR

## 2020-04-08 MED ORDER — SULFAMETHOXAZOLE-TRIMETHOPRIM 800-160 MG PO TABS: 1.0000 | ORAL_TABLET | Freq: Two times a day (BID) | ORAL | 0 refills | Status: DC

## 2020-04-08 NOTE — Patient Instructions (Signed)
Urinary Tract Infection, Adult A urinary tract infection (UTI) is an infection of any part of the urinary tract. The urinary tract includes:  The kidneys.  The ureters.  The bladder.  The urethra. These organs make, store, and get rid of pee (urine) in the body. What are the causes? This is caused by germs (bacteria) in your genital area. These germs grow and cause swelling (inflammation) of your urinary tract. What increases the risk? You are more likely to develop this condition if:  You have a small, thin tube (catheter) to drain pee.  You cannot control when you pee or poop (incontinence).  You are female, and: ? You use these methods to prevent pregnancy:  A medicine that kills sperm (spermicide).  A device that blocks sperm (diaphragm). ? You have low levels of a female hormone (estrogen). ? You are pregnant.  You have genes that add to your risk.  You are sexually active.  You take antibiotic medicines.  You have trouble peeing because of: ? A prostate that is bigger than normal, if you are female. ? A blockage in the part of your body that drains pee from the bladder (urethra). ? A kidney stone. ? A nerve condition that affects your bladder (neurogenic bladder). ? Not getting enough to drink. ? Not peeing often enough.  You have other conditions, such as: ? Diabetes. ? A weak disease-fighting system (immune system). ? Sickle cell disease. ? Gout. ? Injury of the spine. What are the signs or symptoms? Symptoms of this condition include:  Needing to pee right away (urgently).  Peeing often.  Peeing small amounts often.  Pain or burning when peeing.  Blood in the pee.  Pee that smells bad or not like normal.  Trouble peeing.  Pee that is cloudy.  Fluid coming from the vagina, if you are female.  Pain in the belly or lower back. Other symptoms include:  Throwing up (vomiting).  No urge to eat.  Feeling mixed up (confused).  Being tired  and grouchy (irritable).  A fever.  Watery poop (diarrhea). How is this treated? This condition may be treated with:  Antibiotic medicine.  Other medicines.  Drinking enough water. Follow these instructions at home:  Medicines  Take over-the-counter and prescription medicines only as told by your doctor.  If you were prescribed an antibiotic medicine, take it as told by your doctor. Do not stop taking it even if you start to feel better. General instructions  Make sure you: ? Pee until your bladder is empty. ? Do not hold pee for a long time. ? Empty your bladder after sex. ? Wipe from front to back after pooping if you are a female. Use each tissue one time when you wipe.  Drink enough fluid to keep your pee pale yellow.  Keep all follow-up visits as told by your doctor. This is important. Contact a doctor if:  You do not get better after 1-2 days.  Your symptoms go away and then come back. Get help right away if:  You have very bad back pain.  You have very bad pain in your lower belly.  You have a fever.  You are sick to your stomach (nauseous).  You are throwing up. Summary  A urinary tract infection (UTI) is an infection of any part of the urinary tract.  This condition is caused by germs in your genital area.  There are many risk factors for a UTI. These include having a small, thin   tube to drain pee and not being able to control when you pee or poop.  Treatment includes antibiotic medicines for germs.  Drink enough fluid to keep your pee pale yellow. This information is not intended to replace advice given to you by your health care provider. Make sure you discuss any questions you have with your health care provider. Document Revised: 11/16/2018 Document Reviewed: 06/08/2018 Elsevier Patient Education  2020 [employer]  

## 2020-04-08 NOTE — Progress Notes (Signed)
Patient ID: Jackie Stevens, female    DOB: 08/08/1946  Age: 74 y.o. MRN: [id number]    Subjective:  Subjective  HPI Jackie Stevens presents for f/u uti    She has no symptoms   Review of Systems  Constitutional: Negative for chills and fever.  HENT: Negative for congestion, postnasal drip, rhinorrhea and sinus pressure.   Respiratory: Negative for cough, chest tightness, shortness of breath and wheezing.   Cardiovascular: Negative for chest pain, palpitations and leg swelling.  Genitourinary: Negative for dysuria, flank pain, frequency, hematuria and urgency.  Allergic/Immunologic: Negative for environmental allergies.    History Past Medical History:  Diagnosis Date  . Abnormal cervical cytology 10/25/2012   Follows with Dr Leavy Cella of Gyn  . Allergic rhinitis 11/04/2010   Qualifier: Diagnosis of  By: Annamaria Boots MD, Clinton D   . Anemia 10/06/2013  . Anginal pain    pt has history of CP states had cardiac workup with no specific issues identified   . Arthritis    knees; right thumb; shoulders  . 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  Loss of glenohumeral joint space.  Well-maintained subacromial space.  Injected 09/30/2016 [name] .  Marland Kitchen Asthma, mild intermittent 04/02/2013  . Chicken pox as a child  . Colonic diverticular abscess 11/21/2012  . Constipation   . Decreased hearing   . Dermatitis 03/06/2017  . Diverticulitis 10/25/2012   pt. reports that a drain was placed - 09/2012    . Diverticulitis of rectosigmoid 11/28/2012  . Dry mouth   . Essential hypertension 11/08/2010   Qualifier: Diagnosis of  By: Annamaria Boots MD, Clinton D   . Excessive thirst   . External hemorrhoid, bleeding    "sometimes" (12-23-12)  . Frequent urination   . GERD (gastroesophageal reflux disease)   . H/O hiatal hernia   . Hand tingling   . Heart murmur   .  Hyperglycemia 01/17/2020  . Hyperlipidemia   . Hypothyroidism   . Incontinence   . Insomnia   . Kidney stones 1970's   "passed on their own" (23-Dec-2012)  . Low back pain 06/09/2014  . Measles as a child  . 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 Obesity 09/11/2017  . Obstructive sleep apnea 11/04/2010   NPSG [facility] 07/15/10- AHI 13.5/ hr CPAP 10/APS    . Otitis media 01/07/2014  . Palpitations   . Paresthesia 03/23/2015   Left face  . PONV (postoperative nausea and vomiting)   . Rheumatoid arteritis   . Shortness of breath dyspnea    using stairs  . Sinus pain   . Swelling of both lower extremities   . Thyroid cancer 1980's  . Tinnitus   . UTI (urinary tract infection) 04/02/2013  . Vertigo 08/05/2019  . Vitamin D deficiency 02/28/2018    She has a past surgical history that includes Thyroidectomy, partial (1988); Sigmoid resection / rectopexy (12-23-2012); Colon surgery; Tonsillectomy (1951?); Cholecystectomy (1990); Vaginal hysterectomy (1970's); Dilation and curettage of uterus (1960's); Transrectal drainage of pelvic abscess (10/27/2012); Colostomy revision (12/23/2012); Cystoscopy with stent placement (12/23/12); Lysis of adhesion (N/A, 12/02/2015); and Robotic assisted bilateral salpingo oophorectomy (Bilateral, 12/02/2015).   Her family history includes Alcohol abuse in her brother and paternal grandfather; Alzheimer's disease in her mother and paternal grandmother; Cancer in her father, paternal aunt, and paternal grandmother; Depression in her mother; Diabetes in her maternal grandmother; Emphysema in  her brother; Hearing loss in her brother; Heart attack in her paternal grandfather; Heart disease in her father and mother; Hyperlipidemia in her father and paternal grandmother; Hypertension in her father; Neurofibromatosis in her son and son; Pneumonia in her father; Stroke in her father.She reports that she has never smoked. She has never used smokeless tobacco. She reports that she does  not drink alcohol or use drugs.  Current Outpatient Medications on File Prior to Visit  Medication Sig Dispense Refill  . azelastine (ASTELIN) 0.1 % nasal spray PLACE 2 SPRAYS INTO BOTH NOSTRILS 2 (TWO) TIMES DAILY. 30 mL 1  . famotidine (PEPCID) 20 MG tablet TAKE 2 TABLETS BY MOUTH EVERY DAY 180 tablet 1  . fluticasone (FLONASE) 50 MCG/ACT nasal spray Allergy Relief (fluticasone) 50 mcg/actuation nasal spray,suspension  Spray 1 spray every day by intranasal route.    . hydrochlorothiazide (HYDRODIURIL) 25 MG tablet TAKE 1 TABLET BY MOUTH EVERY DAY 90 tablet 1  . KETOCONAZOLE, TOPICAL, 1 % SHAM Apply 1 Dose topically once a week. 200 mL 1  . levothyroxine (SYNTHROID, LEVOTHROID) 150 MCG tablet Take 150 mcg by mouth daily before breakfast.    . losartan (COZAAR) 100 MG tablet TAKE 1 TABLET BY MOUTH EVERYDAY AT BEDTIME 90 tablet 1  . meclizine (ANTIVERT) 25 MG tablet Take 1 tablet (25 mg total) by mouth 3 (three) times daily as needed for dizziness. 30 tablet 1  . metoprolol tartrate (LOPRESSOR) 100 MG tablet TAKE 1.5 TABLETS (150 MG TOTAL) BY MOUTH 2 (TWO) TIMES DAILY. 270 tablet 1  . Multiple Vitamins-Minerals (MULTIVITAMIN WOMEN 50+ PO) Take 1 tablet by mouth daily.    . naproxen (NAPROSYN) 375 MG tablet TAKE 1 TABLET BY MOUTH 2 (TWO) TIMES DAILY WITH A MEAL. 180 tablet 1  . nitroGLYCERIN (NITROSTAT) 0.4 MG SL tablet PLACE 1 TABLET UNDER THE TONGUE EVERY 5 MINUTES AS NEEDED FOR CHEST PAIN 25 tablet 1  . NONFORMULARY OR COMPOUNDED ITEM Antifungal solution: Terbinafine 3%, Fluconazole 2%, Tea Tree Oil 5%, Urea 10%, Ibuprofen 2% in DMSO suspension #60mL 1 each 3  . omeprazole (PRILOSEC) 40 MG capsule TAKE 1 CAPSULE BY MOUTH TWICE A DAY 180 capsule 1  . prednisoLONE acetate (PRED FORTE) 1 % ophthalmic suspension PLACE 4 DROPS IN BOTH EARS TWICE DAILY AS NEEDED FOR ITCHING    . rosuvastatin (CRESTOR) 40 MG tablet Take 1 tablet (40 mg total) by mouth daily. 30 tablet 3  . Vitamin D, Cholecalciferol,  1000 UNITS CAPS Take 1 capsule by mouth daily.     No current facility-administered medications on file prior to visit.     Objective:  Objective  Physical Exam Vitals and nursing note reviewed.  Constitutional:      Appearance: Normal appearance.  Abdominal:     Palpations: Abdomen is soft.     Tenderness: There is no abdominal tenderness.  Neurological:     Mental Status: She is alert.    BP 140/78 (BP Location: Right Arm, Patient Position: Sitting, Cuff Size: Normal)   Pulse 60   Temp 97.9 F (36.6 C) (Temporal)   Resp 18   Ht 5\' 3"  (1.6 m)   Wt 207 lb 3.2 oz (94 kg)   SpO2 97%   BMI 36.70 kg/m  Wt Readings from Last 3 Encounters:  04/08/20 207 lb 3.2 oz (94 kg)  03/27/20 210 lb 6.4 oz (95.4 kg)  01/17/20 208 lb (94.3 kg)     Lab Results  Component Value Date   WBC 7.1  02/12/2020   HGB 12.9 02/12/2020   HCT 37.8 02/12/2020   PLT 258.0 02/12/2020   GLUCOSE 93 02/12/2020   CHOL 158 02/12/2020   TRIG 273.0 (H) 02/12/2020   HDL 47.60 02/12/2020   LDLDIRECT 81.0 02/12/2020   LDLCALC 73 08/02/2019   ALT 26 02/12/2020   AST 25 02/12/2020   NA 136 02/12/2020   K 4.1 02/12/2020   CL 98 02/12/2020   CREATININE 0.73 02/12/2020   BUN 19 02/12/2020   CO2 30 02/12/2020   TSH 1.07 02/12/2020   INR 0.99 03/16/2015   HGBA1C 5.6 02/12/2020    MR BRAIN WO CONTRAST  Result Date: 12/20/2019 CLINICAL DATA:  Memory loss. Additional history provided: Memory loss for 6 months. EXAM: MRI HEAD WITHOUT CONTRAST TECHNIQUE: Multiplanar, multiecho pulse sequences of the brain and surrounding structures were obtained without intravenous contrast. COMPARISON:  Head CT 07/17/2019, brain MRI 03/16/2015 FINDINGS: Brain: There is no evidence of acute infarct. No evidence of intracranial mass. No midline shift or extra-axial fluid collection. No chronic intracranial blood products. No significant white matter disease for age. Mild-to-moderate generalized cerebral atrophy without appreciable  lobar predominance Prominence of the lateral and third ventricles has slightly increased from MRI 03/16/2015, and is favored secondary to parenchymal volume loss. Vascular: Flow voids maintained within the proximal large arterial vessels. Skull and upper cervical spine: No focal suspicious marrow lesion. Sinuses/Orbits: Visualized orbits demonstrate no acute abnormality. Minimal ethmoid sinus mucosal thickening. No significant mastoid effusion. IMPRESSION: Mild-to-moderate generalized cerebral atrophy without appreciable lobar predominance. Prominence of the lateral and third ventricles has slightly increased since MRI 03/16/2015, and is favored secondary to parenchymal volume loss. Electronically Signed   By: [name] DO   On: 12/20/2019 12:02     Assessment & Plan:  Plan  I have discontinued Delainy P. Caetano's hyoscyamine, gabapentin, and cephALEXin. I am also having her start on sulfamethoxazole-trimethoprim. Additionally, I am having her maintain her Vitamin D (Cholecalciferol), Multiple Vitamins-Minerals (MULTIVITAMIN WOMEN 50+ PO), KETOCONAZOLE (TOPICAL), levothyroxine, nitroGLYCERIN, meclizine, prednisoLONE acetate, NONFORMULARY OR COMPOUNDED ITEM, azelastine, fluticasone, omeprazole, famotidine, hydrochlorothiazide, naproxen, rosuvastatin, losartan, and metoprolol tartrate. We administered cefTRIAXone.  Meds ordered this encounter  Medications  . sulfamethoxazole-trimethoprim (BACTRIM DS) 800-160 MG tablet    Sig: Take 1 tablet by mouth 2 (two) times daily.    Dispense:  14 tablet    Refill:  0  . cefTRIAXone (ROCEPHIN) injection 1 g    Problem List Items Addressed This Visit    None    Visit Diagnoses    Urinary frequency    -  Primary   Relevant Orders   POC Urinalysis Dipstick OB (Completed)   Urine Culture   Urinary tract infection without hematuria, site unspecified       Relevant Medications   sulfamethoxazole-trimethoprim (BACTRIM DS) 800-160 MG tablet   cefTRIAXone  (ROCEPHIN) injection 1 g (Completed)   Other Relevant Orders   POCT Urinalysis Dipstick (Automated)    ua -- + leuk and nitrites  Bactrim sent to pharmacy Culture pending  Consider urology Follow-up: Return in about 2 weeks (around 04/22/2020), or if symptoms worsen or fail to improve, for lab app ---- repeat urine-.  Ann Held, DO

## 2020-04-09 LAB — URINE CULTURE
MICRO NUMBER:: 10410701
SPECIMEN QUALITY:: ADEQUATE

## 2020-04-15 DIAGNOSIS — G4733 Obstructive sleep apnea (adult) (pediatric): Secondary | ICD-10-CM | POA: Diagnosis not present

## 2020-04-19 DIAGNOSIS — G4733 Obstructive sleep apnea (adult) (pediatric): Secondary | ICD-10-CM | POA: Diagnosis not present

## 2020-04-22 ENCOUNTER — Other Ambulatory Visit (INDEPENDENT_AMBULATORY_CARE_PROVIDER_SITE_OTHER): Payer: Medicare HMO

## 2020-04-22 ENCOUNTER — Other Ambulatory Visit: Payer: Self-pay

## 2020-04-22 DIAGNOSIS — R82998 Other abnormal findings in urine: Secondary | ICD-10-CM | POA: Diagnosis not present

## 2020-04-22 DIAGNOSIS — N390 Urinary tract infection, site not specified: Secondary | ICD-10-CM | POA: Diagnosis not present

## 2020-04-22 LAB — POC URINALSYSI DIPSTICK (AUTOMATED)
Bilirubin, UA: NEGATIVE
Blood, UA: NEGATIVE
Glucose, UA: NEGATIVE
Ketones, UA: NEGATIVE
Nitrite, UA: NEGATIVE
Protein, UA: NEGATIVE
Spec Grav, UA: 1.025 (ref 1.010–1.025)
Urobilinogen, UA: 0.2 E.U./dL
pH, UA: 5.5 (ref 5.0–8.0)

## 2020-04-22 NOTE — Addendum Note (Signed)
Addended by: Caffie Pinto on: 04/22/2020 01:51 PM   Modules accepted: Orders

## 2020-04-25 LAB — URINE CULTURE
MICRO NUMBER:: 10463783
SPECIMEN QUALITY:: ADEQUATE

## 2020-04-28 ENCOUNTER — Other Ambulatory Visit: Payer: Self-pay | Admitting: *Deleted

## 2020-04-28 DIAGNOSIS — N390 Urinary tract infection, site not specified: Secondary | ICD-10-CM

## 2020-04-28 DIAGNOSIS — R350 Frequency of micturition: Secondary | ICD-10-CM

## 2020-04-28 MED ORDER — NITROFURANTOIN MONOHYD MACRO 100 MG PO CAPS
100.0000 mg | ORAL_CAPSULE | Freq: Two times a day (BID) | ORAL | 0 refills | Status: AC
Start: 2020-04-28 — End: 2020-05-05

## 2020-05-06 DIAGNOSIS — K219 Gastro-esophageal reflux disease without esophagitis: Secondary | ICD-10-CM | POA: Diagnosis not present

## 2020-05-06 DIAGNOSIS — M1990 Unspecified osteoarthritis, unspecified site: Secondary | ICD-10-CM | POA: Diagnosis not present

## 2020-05-06 DIAGNOSIS — Z008 Encounter for other general examination: Secondary | ICD-10-CM | POA: Diagnosis not present

## 2020-05-06 DIAGNOSIS — E039 Hypothyroidism, unspecified: Secondary | ICD-10-CM | POA: Diagnosis not present

## 2020-05-06 DIAGNOSIS — K5900 Constipation, unspecified: Secondary | ICD-10-CM | POA: Diagnosis not present

## 2020-05-06 DIAGNOSIS — E785 Hyperlipidemia, unspecified: Secondary | ICD-10-CM | POA: Diagnosis not present

## 2020-05-06 DIAGNOSIS — G4733 Obstructive sleep apnea (adult) (pediatric): Secondary | ICD-10-CM | POA: Diagnosis not present

## 2020-05-06 DIAGNOSIS — R32 Unspecified urinary incontinence: Secondary | ICD-10-CM | POA: Diagnosis not present

## 2020-05-06 DIAGNOSIS — E669 Obesity, unspecified: Secondary | ICD-10-CM | POA: Diagnosis not present

## 2020-05-06 DIAGNOSIS — I10 Essential (primary) hypertension: Secondary | ICD-10-CM | POA: Diagnosis not present

## 2020-05-06 DIAGNOSIS — Z6834 Body mass index (BMI) 34.0-34.9, adult: Secondary | ICD-10-CM | POA: Diagnosis not present

## 2020-05-13 ENCOUNTER — Other Ambulatory Visit: Payer: Self-pay | Admitting: Family Medicine

## 2020-05-14 ENCOUNTER — Encounter: Payer: Self-pay | Admitting: Podiatry

## 2020-05-14 ENCOUNTER — Other Ambulatory Visit: Payer: Self-pay

## 2020-05-14 ENCOUNTER — Ambulatory Visit: Payer: Medicare HMO | Admitting: Podiatry

## 2020-05-14 DIAGNOSIS — M79676 Pain in unspecified toe(s): Secondary | ICD-10-CM | POA: Diagnosis not present

## 2020-05-14 DIAGNOSIS — M79675 Pain in left toe(s): Secondary | ICD-10-CM | POA: Diagnosis not present

## 2020-05-14 DIAGNOSIS — Q828 Other specified congenital malformations of skin: Secondary | ICD-10-CM

## 2020-05-14 DIAGNOSIS — M79674 Pain in right toe(s): Secondary | ICD-10-CM | POA: Diagnosis not present

## 2020-05-14 DIAGNOSIS — B351 Tinea unguium: Secondary | ICD-10-CM

## 2020-05-14 DIAGNOSIS — L84 Corns and callosities: Secondary | ICD-10-CM | POA: Diagnosis not present

## 2020-05-14 NOTE — Patient Instructions (Signed)

## 2020-05-16 ENCOUNTER — Other Ambulatory Visit (INDEPENDENT_AMBULATORY_CARE_PROVIDER_SITE_OTHER): Payer: Medicare HMO

## 2020-05-16 ENCOUNTER — Other Ambulatory Visit: Payer: Self-pay

## 2020-05-16 DIAGNOSIS — R350 Frequency of micturition: Secondary | ICD-10-CM

## 2020-05-16 DIAGNOSIS — E782 Mixed hyperlipidemia: Secondary | ICD-10-CM

## 2020-05-16 DIAGNOSIS — N390 Urinary tract infection, site not specified: Secondary | ICD-10-CM | POA: Diagnosis not present

## 2020-05-16 DIAGNOSIS — G4733 Obstructive sleep apnea (adult) (pediatric): Secondary | ICD-10-CM | POA: Diagnosis not present

## 2020-05-16 LAB — POC URINALSYSI DIPSTICK (AUTOMATED)
Bilirubin, UA: NEGATIVE
Blood, UA: NEGATIVE
Glucose, UA: NEGATIVE
Ketones, UA: NEGATIVE
Nitrite, UA: NEGATIVE
Protein, UA: POSITIVE — AB
Spec Grav, UA: 1.015 (ref 1.010–1.025)
Urobilinogen, UA: 0.2 E.U./dL
pH, UA: 7 (ref 5.0–8.0)

## 2020-05-16 LAB — LIPID PANEL
Cholesterol: 118 mg/dL (ref 0–200)
HDL: 47 mg/dL (ref >39.00)
LDL Cholesterol: 55 mg/dL (ref 0–99)
NonHDL: 70.84
Total CHOL/HDL Ratio: 3
Triglycerides: 78 mg/dL (ref 0.0–149.0)
VLDL: 15.6 mg/dL (ref 0.0–40.0)

## 2020-05-16 NOTE — Addendum Note (Signed)
Addended by: Kelle Darting A on: 05/16/2020 07:47 AM   Modules accepted: Orders

## 2020-05-17 LAB — URINE CULTURE
MICRO NUMBER:: 10554188
Result:: NO GROWTH
SPECIMEN QUALITY:: ADEQUATE

## 2020-05-19 NOTE — Progress Notes (Signed)
Subjective: Jackie Stevens is a pleasant 74 y.o. female patient seen today painful corn(s) b/l feet and painful mycotic toenails b/l that are difficult to trim. Pain interferes with ambulation. Aggravating factors include wearing enclosed shoe gear. Pain is relieved with periodic professional debridement.  Patient Active Problem List   Diagnosis Date Noted  . 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 Hyperglycemia 01/17/2020  . Vertigo 08/05/2019  . Palpitations 05/28/2019  . Other fatigue 02/28/2018  . Shortness of breath on exertion 02/28/2018  . Vitamin D deficiency 02/28/2018  . Obesity 09/11/2017  . Dermatitis 03/06/2017  . Arthritis of left shoulder region 09/30/2016  . Pain of joint of left ankle and foot 09/15/2016  . Left shoulder pain 09/14/2016  . Preventative health care 02/26/2016  . Radicular pain of sacrum 02/26/2016  . Vitamin B12 deficiency 02/26/2016  . Ventral hernia 12/02/2015  . Pelvic mass in female 12/02/2015  . Colitis 09/14/2015  . Paresthesia 03/23/2015  . Otitis, externa, infective 03/13/2015  . Hyponatremia 03/13/2015  . Cystitis 09/10/2014  . Headache 08/21/2014  . Low back pain 06/09/2014  . Otitis media 01/07/2014  . Anemia 10/06/2013  . Medicare annual wellness visit, subsequent 10/06/2013  . Asthma, mild intermittent 04/02/2013  . Internal hemorrhoid, bleeding 01/23/2013  . Diverticulitis of rectosigmoid 11/28/2012  . Colonic diverticular abscess 11/21/2012  . Abnormal cervical cytology 10/25/2012  . Cancer ([place])   . Insomnia   . Hypothyroidism   . Constipation   . Cough 09/02/2012  . Chest pain 08/05/2011  . Hyperlipidemia, mixed 11/08/2010  . Essential hypertension 11/08/2010  . GERD (gastroesophageal reflux disease) 11/08/2010  . Obstructive sleep apnea 11/04/2010  . Allergic rhinitis 11/04/2010    Current Outpatient Medications on File Prior to Visit  Medication Sig Dispense Refill  . azelastine (ASTELIN) 0.1 % nasal spray  PLACE 2 SPRAYS INTO BOTH NOSTRILS 2 (TWO) TIMES DAILY. 30 mL 1  . famotidine (PEPCID) 20 MG tablet TAKE 2 TABLETS BY MOUTH EVERY DAY 180 tablet 1  . fluticasone (FLONASE) 50 MCG/ACT nasal spray Allergy Relief (fluticasone) 50 mcg/actuation nasal spray,suspension  Spray 1 spray every day by intranasal route.    . hydrochlorothiazide (HYDRODIURIL) 25 MG tablet TAKE 1 TABLET BY MOUTH EVERY DAY 90 tablet 1  . KETOCONAZOLE, TOPICAL, 1 % SHAM Apply 1 Dose topically once a week. 200 mL 1  . levothyroxine (SYNTHROID, LEVOTHROID) 150 MCG tablet Take 150 mcg by mouth daily before breakfast.    . losartan (COZAAR) 100 MG tablet TAKE 1 TABLET BY MOUTH EVERYDAY AT BEDTIME 90 tablet 1  . meclizine (ANTIVERT) 25 MG tablet Take 1 tablet (25 mg total) by mouth 3 (three) times daily as needed for dizziness. 30 tablet 1  . metoprolol tartrate (LOPRESSOR) 100 MG tablet TAKE 1.5 TABLETS (150 MG TOTAL) BY MOUTH 2 (TWO) TIMES DAILY. 270 tablet 1  . Multiple Vitamins-Minerals (MULTIVITAMIN WOMEN 50+ PO) Take 1 tablet by mouth daily.    . naproxen (NAPROSYN) 375 MG tablet TAKE 1 TABLET BY MOUTH 2 (TWO) TIMES DAILY WITH A MEAL. 180 tablet 1  . nitroGLYCERIN (NITROSTAT) 0.4 MG SL tablet PLACE 1 TABLET UNDER THE TONGUE EVERY 5 MINUTES AS NEEDED FOR CHEST PAIN 25 tablet 1  . NONFORMULARY OR COMPOUNDED ITEM Antifungal solution: Terbinafine 3%, Fluconazole 2%, Tea Tree Oil 5%, Urea 10%, Ibuprofen 2% in DMSO suspension #7mL 1 each 3  . omeprazole (PRILOSEC) 40 MG capsule TAKE 1 CAPSULE BY MOUTH TWICE A DAY 180 capsule 1  . prednisoLONE  acetate (PRED FORTE) 1 % ophthalmic suspension PLACE 4 DROPS IN BOTH EARS TWICE DAILY AS NEEDED FOR ITCHING    . rosuvastatin (CRESTOR) 40 MG tablet Take 1 tablet (40 mg total) by mouth daily. 30 tablet 3  . Vitamin D, Cholecalciferol, 1000 UNITS CAPS Take 1 capsule by mouth daily.     No current facility-administered medications on file prior to visit.    Allergies  Allergen Reactions  .  Neomycin-Bacitracin Zn-Polymyx Rash    Polysporin- is tolerated   . Niacin Other (See Comments) and Cough    "cough til I threw up" (12/19/2012)  . Ciprofloxacin Hives    Got cipro and flagyl at same time, localized hives to IV arm  . Flagyl [Metronidazole] Hives    Got cipro and flagyl at same time, localized hives to IV arm    Objective: Physical Exam  General: JAMIKA SADEK is a pleasant 74 y.o. Caucasian female, in NAD. AAO x 3.   Vascular:  Capillary fill time to digits <3 seconds b/l lower extremities. Palpable DP pulses b/l. Palpable PT pulses b/l. Pedal hair present b/l. Skin temperature gradient within normal limits b/l. No pain with calf compression b/l.  Dermatological:  Pedal skin with normal turgor, texture and tone bilaterally. No open wounds bilaterally. No interdigital macerations bilaterally. Toenails 1-5 b/l elongated, discolored, dystrophic, thickened, crumbly with subungual debris and tenderness to dorsal palpation. Hyperkeratotic lesion(s) L 4th toe and R 2nd toe.  No erythema, no edema, no drainage, no flocculence. Porokeratotic lesion(s) submet head 2 right foot and submet head 4 right foot. No erythema, no edema, no drainage, no flocculence.  Musculoskeletal:  Normal muscle strength 5/5 to all lower extremity muscle groups bilaterally. No pain crepitus or joint limitation noted with ROM b/l. Hallux valgus with bunion deformity noted b/l lower extremities. Hammertoes noted to the L 2nd toe and R 2nd toe.  Neurological:  Protective sensation intact 5/5 intact bilaterally with 10g monofilament b/l. Vibratory sensation intact b/l.  Assessment and Plan:  1. Pain due to onychomycosis of toenail   2. Porokeratosis   3. Corns   4. Pain in toes of both feet    -Examined patient. -Toenails 1-5 b/l were debrided in length and girth with sterile nail nippers and dremel without iatrogenic bleeding.  -Corn(s) L 4th toe and R 2nd toe pared utilizing sterile scalpel blade  without complication or incident. Total number debrided=2. -Painful porokeratotic lesion(s) submet head 2 right foot and submet head 4 right foot pared and enucleated with sterile scalpel blade without incident. -Patient to continue soft, supportive shoe gear daily. -Patient to report any pedal injuries to medical professional immediately. -Patient/POA to call should there be question/concern in the interim.  Return in about 9 weeks (around 07/16/2020) for nail and callus trim.  Marzetta Board, DPM

## 2020-05-20 DIAGNOSIS — G4733 Obstructive sleep apnea (adult) (pediatric): Secondary | ICD-10-CM | POA: Diagnosis not present

## 2020-05-27 ENCOUNTER — Other Ambulatory Visit: Payer: Self-pay | Admitting: Family Medicine

## 2020-05-28 ENCOUNTER — Ambulatory Visit: Payer: PPO | Admitting: Internal Medicine

## 2020-05-28 ENCOUNTER — Other Ambulatory Visit: Payer: Self-pay

## 2020-05-28 ENCOUNTER — Encounter: Payer: Self-pay | Admitting: Internal Medicine

## 2020-05-28 VITALS — BP 120/72 | HR 58 | Temp 97.3°F | Ht 63.0 in | Wt 209.2 lb

## 2020-05-28 DIAGNOSIS — G4733 Obstructive sleep apnea (adult) (pediatric): Secondary | ICD-10-CM

## 2020-05-28 NOTE — Patient Instructions (Signed)
We can continue CPAP auto 5-15, mask of choice, humidifier,supplies, Airview/ card  Order- schedule mask fitting at [facility] might look for a "helmet liner" to go on your head at night, under the CPAP headgear, to see if it protects your hair.  Please call if we can help

## 2020-05-28 NOTE — Progress Notes (Signed)
Subjective:    Patient ID: Jackie Stevens, female    DOB: [birth year], 74 y.o.   MRN: [id number]  HPI followed for obstructive sleep apnea complicated by GERD, HBP, allergic rhinitis, obesity NPSG  07/15/10--AHI 13.5/hour, desaturation to 87%, body weight 215 pounds PSG   ---------------------------------------------------------------------------------  05/29/2019- Virtual Visit via Telephone Note History of Present Illness:  74 year old female never smoker followed for OSA, complicated by chronic cough, GERD, HBP, allergic rhinitis CPAP auto 5-15/APS  Replaced CPAP machine 4/ 2020 -----OSA on CPAP; DME: [facility], states she is using it every night, no complaints She reports doing very well with no problems using her replacement CPAP machine. Settings are comfortable.  Wakes occasionally at 4:00AM, too late to take a sleep aide. Discussed sleep hygiene, occasional nap.  Observations/Objective:   Assessment and Plan: OSA- good compliance and control by report. Can't access download card with televisit. Continue auto 5-15  Follow Up Instructions: 1 year  --------------------------------  05/28/20- 74 year old female never smoker followed for OSA, complicated by chronic cough, GERD, HBP, allergic rhinitis CPAP auto 5-15/APS  replaced  03/2019 Download- Body weight today-     Review of Systems-see HPI + = positive Constitutional:   No-   weight loss, night sweats, fevers, chills, fatigue, lassitude. HEENT:   No-  headaches, difficulty swallowing, tooth/dental problems, sore throat,         No-sneezing, itching, ear ache,no- nasal congestion, post nasal drip,  CV:  No-   chest pain, orthopnea, PND, swelling in lower extremities, anasarca, dizziness, palpitations Resp: No-   shortness of breath with exertion or at rest.              No-   productive cough,   non-productive cough,  No-  coughing up of blood.              No-   change in color of mucus.  No- wheezing.   Skin: No-   rash or  lesions. GI:  No-   heartburn, indigestion, abdominal pain, nausea, vomiting,  GU:  MS:  No-   joint pain or swelling.   Neuro- nothing unusual Psych:  No- change in mood or affect. No depression or anxiety.  No memory loss.   Objective:   Physical Exam General- Alert, Oriented, Affect-appropriate, Distress- none acute, + Morbidly obese Skin- rash-none, lesions- none, excoriation- none Lymphadenopathy- none Head- atraumatic            Eyes- Gross vision intact, PERRLA, conjunctivae clear secretions            Ears- Hearing, canals slightly retracted, no fluid            Nose- Clear, No-Septal dev, mucus, polyps, erosion, perforation             Throat- Mallampati III-IV , mucosa clear- not red , drainage- none, tonsils- atrophic, not hoarse Neck- flexible , trachea midline, no stridor , thyroid nl, carotid no bruit Chest - symmetrical excursion , unlabored           Heart/CV- RRR , murmur+ 1/6 LUSB , no gallop  , no rub, nl s1 s2                           - JVD- none , edema- none, stasis changes- none, varices- none           Lung- clear to P&A, wheeze- none, cough- none , dullness-none, rub- none  Chest wall-  Abd-  Br/ Gen/ Rectal- Not done, not indicated Extrem- cyanosis- none, clubbing, none, atrophy- none, strength- nl Neuro- grossly intact to observation   05/29/2019- Virtual Visit via Telephone Note   History of Present Illness:  74 year old female never smoker followed for OSA, complicated by chronic cough, GERD, HBP, allergic rhinitis CPAP auto 5-15/APS  Replaced CPAP machine 4/ 2020 -----OSA on CPAP; DME: APS, states she is using it every night, no complaints She reports doing very well with no problems using her replacement CPAP machine. Settings are comfortable.  Wakes occasionally at 4:00AM, too late to take a sleep aide. Discussed sleep hygiene, occasional nap.  Observations/Objective:   Assessment and Plan: OSA- good compliance and control by  report. Can't access download card with televisit. Continue auto 5-15  Follow Up Instructions: 1 year        Assessment & Plan:

## 2020-05-28 NOTE — Progress Notes (Signed)
Subjective:   Jackie Stevens is a 74 y.o. female who presents for Medicare Annual (Subsequent) preventive examination.  Review of Systems:    Home Safety/Smoke Alarms: Feels safe in home. Smoke alarms in place.  Lives w/ husband and and adult son in 2 story home. Uses rollator.  Female:         Mammo- 09/03/19      Dexa scan- 08/30/17. Pt states she will do in September w/ mammogram.        CCS-12/20/13. Recall not on file.   Objective:     Vitals: BP 140/82 (BP Location: Left Arm, Patient Position: Sitting, Cuff Size: Normal)   Pulse 61   Temp 98.1 F (36.7 C) (Oral)   Ht 5\' 3"  (1.6 m)   Wt 209 lb 12.8 oz (95.2 kg)   SpO2 98%   BMI 37.16 kg/m   Body mass index is 37.16 kg/m.  Advanced Directives 05/29/2020 05/28/2019 04/29/2019 05/25/2018 05/24/2017 12/02/2015 12/02/2015  Does Patient Have a Medical Advance Directive? Yes Yes No Yes Yes Yes Yes  Type of Advance Directive Living will [place];Living will - [place];Living will Living will;Healthcare Power of [place];Living will [place];Living will  Does patient want to make changes to medical advance directive? No - Patient declined No - Patient declined - - No - Patient declined - No - Patient declined  Copy of [place] in Chart? - Yes - validated most recent copy scanned in chart (See row information) - Yes Yes No - copy requested No - copy requested  Would patient like information on creating a medical advance directive? - - - - - - -  Pre-existing out of facility DNR order (yellow form or pink MOST form) - - - - - - -    Tobacco Social History   Tobacco Use  Smoking Status Never Smoker  Smokeless Tobacco Never Used     Counseling given: Not Answered   Clinical Intake:     Pain : No/denies pain                 Past Medical History:  Diagnosis Date  . Abnormal cervical cytology 10/25/2012    Follows with Dr Leavy Cella of Gyn  . Allergic rhinitis 11/04/2010   Qualifier: Diagnosis of  By: Annamaria Boots MD, Clinton D   . Anemia 10/06/2013  . Anginal pain    pt has history of CP states had cardiac workup with no specific issues identified   . Arthritis    knees; right thumb; shoulders  . 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  Loss of glenohumeral joint space.  Well-maintained subacromial space.  Injected 09/30/2016 [name] .  Marland Kitchen Asthma, mild intermittent 04/02/2013  . Chicken pox as a child  . Colonic diverticular abscess 11/21/2012  . Constipation   . Decreased hearing   . Dermatitis 03/06/2017  . Diverticulitis 10/25/2012   pt. reports that a drain was placed - 09/2012    . Diverticulitis of rectosigmoid 11/28/2012  . Dry mouth   . Essential hypertension 11/08/2010   Qualifier: Diagnosis of  By: Annamaria Boots MD, Clinton D   . Excessive thirst   . External hemorrhoid, bleeding    "sometimes" (12/19/2012)  . Frequent urination   . GERD (gastroesophageal reflux disease)   . H/O hiatal hernia   .  Hand tingling   . Heart murmur   . Hyperglycemia 01/17/2020  . Hyperlipidemia   . Hypothyroidism   . Incontinence   . Insomnia   . Kidney stones 1970's   "passed on their own" (24-Dec-2012)  . Low back pain 06/09/2014  . Measles as a child  . 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 Obesity 09/11/2017  . Obstructive sleep apnea 11/04/2010   NPSG [facility] 07/15/10- AHI 13.5/ hr CPAP 10/APS    . Otitis media 01/07/2014  . Palpitations   . Paresthesia 03/23/2015   Left face  . PONV (postoperative nausea and vomiting)   . Rheumatoid arteritis   . Shortness of breath dyspnea    using stairs  . Sinus pain   . Swelling of both lower extremities   . Thyroid cancer 1980's  . Tinnitus   . UTI (urinary tract infection) 04/02/2013  . Vertigo 08/05/2019  . Vitamin D  deficiency 02/28/2018   Past Surgical History:  Procedure Laterality Date  . CHOLECYSTECTOMY  1990  . COLON SURGERY    . COLOSTOMY REVISION  12/24/2012   Procedure: COLON RESECTION SIGMOID;  Surgeon: Gwenyth Ober, MD;  Location: [facility];  Service: General;  Laterality: N/A;  . CYSTOSCOPY WITH STENT PLACEMENT  Dec 24, 2012   Procedure: CYSTOSCOPY WITH STENT PLACEMENT;  Surgeon: Hanley Ben, MD;  Location: [facility];  Service: Urology;  Laterality: N/A;  . DILATION AND CURETTAGE OF UTERUS  1960's   "lots of them; had miscarriages" (December 24, 2012)  . LYSIS OF ADHESION N/A 12/02/2015   Procedure: LAPAROSCOPIC LYSIS OF ADHESION;  Surgeon: Johnathan Hausen, MD;  Location: [facility];  Service: General;  Laterality: N/A;  . ROBOTIC ASSISTED BILATERAL SALPINGO OOPHERECTOMY Bilateral 12/02/2015   Procedure: XI ROBOTIC ASSISTED BILATERAL SALPINGO OOPHORECTOMY;  Surgeon: Everitt Amber, MD;  Location: [facility];  Service: Gynecology;  Laterality: Bilateral;  . SIGMOID RESECTION / RECTOPEXY  12-24-12  . THYROIDECTOMY, PARTIAL  1988   "then did iodine to remove the rest" (December 24, 2012)  . TONSILLECTOMY  1951?  . TRANSRECTAL DRAINAGE OF PELVIC ABSCESS  10/27/2012  . VAGINAL HYSTERECTOMY  1970's   "still have my ovaries" (12-24-12)   Family History  Problem Relation Age of Onset  . Heart disease Father   . Pneumonia Father   . Hypertension Father   . Hyperlipidemia Father   . Cancer Father        skin  . Stroke Father   . Alzheimer's disease Mother   . Heart disease Mother   . Depression Mother   . Emphysema Brother        marijuana and cigarettes  . Alcohol abuse Brother   . Hearing loss Brother   . Diabetes Maternal Grandmother   . Alzheimer's disease Paternal Grandmother   . Cancer Paternal Grandmother        lung?- smoker  . Hyperlipidemia Paternal Grandmother   . Heart attack Paternal Grandfather   . Alcohol abuse Paternal Grandfather   . Neurofibromatosis Son        schwanomatosis  . Neurofibromatosis Son         swanomatosis  . Cancer Paternal Aunt    Social History   Socioeconomic History  . Marital status: Married    Spouse name: [name]  . Number of children: 2  . Years of education: 6  . Highest education level: High school graduate  Occupational History  . Occupation: Retired  Tobacco Use  . Smoking status: Never Smoker  . Smokeless tobacco: Never Used  Vaping Use  . Vaping Use: Never used  Substance and Sexual Activity  . Alcohol use: No    Alcohol/week: 0.0 standard drinks  . Drug use: No  . Sexual activity: Not Currently  Other Topics Concern  . Not on file  Social History Narrative   Married    Children   Right handed   Some college   Social Determinants of Health   Financial Resource Strain: Low Risk   . Difficulty of Paying Living Expenses: Not hard at all  Food Insecurity: No Food Insecurity  . Worried About Charity fundraiser in the Last Year: Never true  . Ran Out of Food in the Last Year: Never true  Transportation Needs: No Transportation Needs  . Lack of Transportation (Medical): No  . Lack of Transportation (Non-Medical): No  Physical Activity:   . Days of Exercise per Week:   . Minutes of Exercise per Session:   Stress:   . Feeling of Stress :   Social Connections:   . Frequency of Communication with Friends and Family:   . Frequency of Social Gatherings with Friends and Family:   . Attends Religious Services:   . Active Member of Clubs or Organizations:   . Attends Archivist Meetings:   Marland Kitchen Marital Status:     Outpatient Encounter Medications as of 05/29/2020  Medication Sig  . azelastine (ASTELIN) 0.1 % nasal spray PLACE 2 SPRAYS INTO BOTH NOSTRILS 2 (TWO) TIMES DAILY.  . famotidine (PEPCID) 20 MG tablet TAKE 2 TABLETS BY MOUTH EVERY DAY  . fluticasone (FLONASE) 50 MCG/ACT nasal spray Allergy Relief (fluticasone) 50 mcg/actuation nasal spray,suspension  Spray 1 spray every day by intranasal route.  . hydrochlorothiazide  (HYDRODIURIL) 25 MG tablet TAKE 1 TABLET BY MOUTH EVERY DAY  . KETOCONAZOLE, TOPICAL, 1 % SHAM Apply 1 Dose topically once a week.  . levothyroxine (SYNTHROID, LEVOTHROID) 150 MCG tablet Take 150 mcg by mouth daily before breakfast.  . losartan (COZAAR) 100 MG tablet TAKE 1 TABLET BY MOUTH EVERYDAY AT BEDTIME  . meclizine (ANTIVERT) 25 MG tablet Take 1 tablet (25 mg total) by mouth 3 (three) times daily as needed for dizziness.  . metoprolol tartrate (LOPRESSOR) 100 MG tablet TAKE 1.5 TABLETS (150 MG TOTAL) BY MOUTH 2 (TWO) TIMES DAILY.  . Multiple Vitamins-Minerals (MULTIVITAMIN WOMEN 50+ PO) Take 1 tablet by mouth daily.  . naproxen (NAPROSYN) 375 MG tablet TAKE 1 TABLET BY MOUTH 2 (TWO) TIMES DAILY WITH A MEAL.  Marland Kitchen NONFORMULARY OR COMPOUNDED ITEM Antifungal solution: Terbinafine 3%, Fluconazole 2%, Tea Tree Oil 5%, Urea 10%, Ibuprofen 2% in DMSO suspension #50mL  . omeprazole (PRILOSEC) 40 MG capsule TAKE 1 CAPSULE BY MOUTH TWICE A DAY  . prednisoLONE acetate (PRED FORTE) 1 % ophthalmic suspension PLACE 4 DROPS IN BOTH EARS TWICE DAILY AS NEEDED FOR ITCHING  . rosuvastatin (CRESTOR) 40 MG tablet TAKE 1 TABLET BY MOUTH EVERY DAY  . Vitamin D, Cholecalciferol, 1000 UNITS CAPS Take 1 capsule by mouth daily.  . nitroGLYCERIN (NITROSTAT) 0.4 MG SL tablet PLACE 1 TABLET UNDER THE TONGUE EVERY 5 MINUTES AS NEEDED FOR CHEST PAIN (Patient not taking: Reported on 05/29/2020)   No facility-administered encounter medications on file as of 05/29/2020.    Activities of Daily Living In your present state of health, do you have any difficulty performing the following activities: 05/29/2020  Hearing? N  Vision? N  Difficulty concentrating or making decisions? N  Walking or climbing stairs? Y  Dressing  or bathing? N  Doing errands, shopping? N  Preparing Food and eating ? N  Using the Toilet? N  In the past six months, have you accidently leaked urine? N  Do you have problems with loss of bowel control? N   Managing your Medications? N  Managing your Finances? N  Housekeeping or managing your Housekeeping? N  Some recent data might be hidden    Patient Care Team: Mosie Lukes, MD as PCP - General (Family Medicine) Marylynn Pearson, MD as Consulting Physician (Obstetrics and Gynecology) Marica Otter, [place] as Consulting Physician (Optometry) Reynold Bowen, MD as Consulting Physician (Endocrinology) Gaynelle Arabian, MD as Consulting Physician (Orthopedic Surgery) Meylor, Marlou Sa, [place] as Consulting Physician (Chiropractic Medicine) Sydnee Levans, MD as Consulting Physician (Dermatology) Hazle Coca, PhD as Consulting Physician (Psychology)    Assessment:   This is a routine wellness examination for [place]. Physical assessment deferred to PCP.  Exercise Activities and Dietary recommendations Current Exercise Habits: The patient does not participate in regular exercise at present, Exercise limited by: None identified Diet (meal preparation, eat out, water intake, caffeinated beverages, dairy products, fruits and vegetables): in general, an "unhealthy" diet education provided      Goals    .  DIET - INCREASE WATER INTAKE      Drink at least 3 glasses of water per day.    .  Increase physical activity    .  Lose 20 lbs  (pt-stated)       Fall Risk Fall Risk  05/29/2020 11/26/2019 05/28/2019 05/25/2018 05/24/2017  Falls in the past year? 0 0 0 No No  Number falls in past yr: 0 0 - - -  Injury with Fall? 0 0 - - -  Follow up Education provided;Falls prevention discussed Falls evaluation completed - - -     Depression Screen PHQ 2/9 Scores 05/29/2020 05/28/2019 11/03/2018 05/25/2018  PHQ - 2 Score 0 0 0 0  PHQ- 9 Score - - - -     Cognitive Function  MMSE - Mini Mental State Exam 05/25/2018 05/24/2017 08/20/2015  Orientation to time 5 5 5   Orientation to Place 5 5 5   Registration 3 3 3   Attention/ Calculation 5 5 5   Recall 2 3 3   Language- name 2 objects 2 2 2   Language- repeat  1 1 1   Language- follow 3 step command 3 3 3   Language- read & follow direction 1 1 1   Write a sentence 1 1 1   Copy design 1 1 1   Total score 29 30 30      6CIT Screen 05/29/2020  What Year? 0 points  What month? 0 points  What time? 0 points  Count back from 20 0 points  Months in reverse 0 points  Repeat phrase 4 points  Total Score 4    Immunization History  Administered Date(s) Administered  . Influenza Split 09/12/2012, 10/02/2015  . Influenza, High Dose Seasonal PF 09/14/2016, 09/05/2017, 09/01/2018, 07/25/2019  . Influenza,inj,Quad PF,6+ Mos 08/30/2013, 10/21/2014, 08/20/2015  . Influenza,inj,quad, With Preservative 09/12/2017  . Influenza-Unspecified 08/01/2019  . Moderna SARS-COVID-2 Vaccination 01/19/2020, 02/11/2020  . Pneumococcal Conjugate-13 07/19/2011, 11/01/2014  . Pneumococcal Polysaccharide-23 03/30/2016  . Tdap 10/02/2013  . Zoster 12/09/2008  . Zoster Recombinat (Shingrix) 01/31/2019, 06/17/2019   Screening Tests Health Maintenance  Topic Date Due  . Hepatitis C Screening  Never done  . INFLUENZA VACCINE  07/13/2020  . TETANUS/TDAP  10/03/2023  . COLONOSCOPY  12/21/2023  . DEXA SCAN  Completed  .  COVID-19 Vaccine  Completed  . PNA vac Low Risk Adult  Completed      Plan:    Please schedule your next medicare wellness visit with me in 1 yr.  Eat heart healthy diet (full of fruits, vegetables, whole grains, lean protein, water--limit salt, fat, and sugar intake) and increase physical activity as tolerated.  Continue doing brain stimulating activities (puzzles, reading, adult coloring books, staying active) to keep memory sharp.     I have personally reviewed and noted the following in the patient's chart:   . Medical and social history . Use of alcohol, tobacco or illicit drugs  . Current medications and supplements . Functional ability and status . Nutritional status . Physical activity . Advanced directives . List of other physicians .  Hospitalizations, surgeries, and ER visits in previous 12 months . Vitals . Screenings to include cognitive, depression, and falls . Referrals and appointments  In addition, I have reviewed and discussed with patient certain preventive protocols, quality metrics, and best practice recommendations. A written personalized care plan for preventive services as well as general preventive health recommendations were provided to patient.     Shela Nevin, [place]  05/29/2020

## 2020-05-29 ENCOUNTER — Other Ambulatory Visit: Payer: Self-pay

## 2020-05-29 ENCOUNTER — Ambulatory Visit (INDEPENDENT_AMBULATORY_CARE_PROVIDER_SITE_OTHER): Payer: Medicare HMO | Admitting: *Deleted

## 2020-05-29 ENCOUNTER — Encounter: Payer: Self-pay | Admitting: *Deleted

## 2020-05-29 VITALS — BP 140/82 | HR 61 | Temp 98.1°F | Ht 63.0 in | Wt 209.8 lb

## 2020-05-29 DIAGNOSIS — Z0000 Encounter for general adult medical examination without abnormal findings: Secondary | ICD-10-CM | POA: Diagnosis not present

## 2020-05-29 NOTE — Patient Instructions (Signed)
Please schedule your next medicare wellness visit with me in 1 yr.  Eat heart healthy diet (full of fruits, vegetables, whole grains, lean protein, water--limit salt, fat, and sugar intake) and increase physical activity as tolerated.  Continue doing brain stimulating activities (puzzles, reading, adult coloring books, staying active) to keep memory sharp.     Jackie Stevens , Thank you for taking time to come for your Medicare Wellness Visit. I appreciate your ongoing commitment to your health goals. Please review the following plan we discussed and let me know if I can assist you in the future.   These are the goals we discussed: Goals    .  DIET - INCREASE WATER INTAKE      Drink at least 3 glasses of water per day.    .  Increase physical activity    .  Lose 20 lbs  (pt-stated)       This is a list of the screening recommended for you and due dates:  Health Maintenance  Topic Date Due  .  Hepatitis C: One time screening is recommended by Center for Disease Control  (CDC) for  adults born from [birth year] through 1967.   Never done  . Flu Shot  07/13/2020  . Tetanus Vaccine  10/03/2023  . Colon Cancer Screening  12/21/2023  . DEXA scan (bone density measurement)  Completed  . COVID-19 Vaccine  Completed  . Pneumonia vaccines  Completed    Preventive Care 16 Years and Older, Female Preventive care refers to lifestyle choices and visits with your health care provider that can promote health and wellness. This includes:  A yearly physical exam. This is also called an annual well check.  Regular dental and eye exams.  Immunizations.  Screening for certain conditions.  Healthy lifestyle choices, such as diet and exercise. What can I expect for my preventive care visit? Physical exam Your health care provider will check:  Height and weight. These may be used to calculate body mass index (BMI), which is a measurement that tells if you are at a healthy weight.  Heart rate and blood  pressure.  Your skin for abnormal spots. Counseling Your health care provider may ask you questions about:  Alcohol, tobacco, and drug use.  Emotional well-being.  Home and relationship well-being.  Sexual activity.  Eating habits.  History of falls.  Memory and ability to understand (cognition).  Work and work Statistician.  Pregnancy and menstrual history. What immunizations do I need?  Influenza (flu) vaccine  This is recommended every year. Tetanus, diphtheria, and pertussis (Tdap) vaccine  You may need a Td booster every 10 years. Varicella (chickenpox) vaccine  You may need this vaccine if you have not already been vaccinated. Zoster (shingles) vaccine  You may need this after age 47. Pneumococcal conjugate (PCV13) vaccine  One dose is recommended after age 54. Pneumococcal polysaccharide (PPSV23) vaccine  One dose is recommended after age 77. Measles, mumps, and rubella (MMR) vaccine  You may need at least one dose of MMR if you were born in 1957 or later. You may also need a second dose. Meningococcal conjugate (MenACWY) vaccine  You may need this if you have certain conditions. Hepatitis A vaccine  You may need this if you have certain conditions or if you travel or work in places where you may be exposed to hepatitis A. Hepatitis B vaccine  You may need this if you have certain conditions or if you travel or work in places where you  may be exposed to hepatitis B. Haemophilus influenzae type b (Hib) vaccine  You may need this if you have certain conditions. You may receive vaccines as individual doses or as more than one vaccine together in one shot (combination vaccines). Talk with your health care provider about the risks and benefits of combination vaccines. What tests do I need? Blood tests  Lipid and cholesterol levels. These may be checked every 5 years, or more frequently depending on your overall health.  Hepatitis C test.  Hepatitis B  test. Screening  Lung cancer screening. You may have this screening every year starting at age 40 if you have a 30-pack-year history of smoking and currently smoke or have quit within the past 15 years.  Colorectal cancer screening. All adults should have this screening starting at age 2 and continuing until age 25. Your health care provider may recommend screening at age 60 if you are at increased risk. You will have tests every 1-10 years, depending on your results and the type of screening test.  Diabetes screening. This is done by checking your blood sugar (glucose) after you have not eaten for a while (fasting). You may have this done every 1-3 years.  Mammogram. This may be done every 1-2 years. Talk with your health care provider about how often you should have regular mammograms.  BRCA-related cancer screening. This may be done if you have a family history of breast, ovarian, tubal, or peritoneal cancers. Other tests  Sexually transmitted disease (STD) testing.  Bone density scan. This is done to screen for osteoporosis. You may have this done starting at age 55. Follow these instructions at home: Eating and drinking  Eat a diet that includes fresh fruits and vegetables, whole grains, lean protein, and low-fat dairy products. Limit your intake of foods with high amounts of sugar, saturated fats, and salt.  Take vitamin and mineral supplements as recommended by your health care provider.  Do not drink alcohol if your health care provider tells you not to drink.  If you drink alcohol: ? Limit how much you have to 0-1 drink a day. ? Be aware of how much alcohol is in your drink. In the [place] one drink equals one 12 oz bottle of beer (355 mL), one 5 oz glass of wine (148 mL), or one 1 oz glass of hard liquor (44 mL). Lifestyle  Take daily care of your teeth and gums.  Stay active. Exercise for at least 30 minutes on 5 or more days each week.  Do not use any products that  contain nicotine or tobacco, such as cigarettes, e-cigarettes, and chewing tobacco. If you need help quitting, ask your health care provider.  If you are sexually active, practice safe sex. Use a condom or other form of protection in order to prevent STIs (sexually transmitted infections).  Talk with your health care provider about taking a low-dose aspirin or statin. What's next?  Go to your health care provider once a year for a well check visit.  Ask your health care provider how often you should have your eyes and teeth checked.  Stay up to date on all vaccines. This information is not intended to replace advice given to you by your health care provider. Make sure you discuss any questions you have with your health care provider. Document Revised: 11/23/2018 Document Reviewed: 11/23/2018 Elsevier Patient Education  2020 [employer].

## 2020-06-02 ENCOUNTER — Other Ambulatory Visit: Payer: Self-pay

## 2020-06-02 ENCOUNTER — Ambulatory Visit (HOSPITAL_BASED_OUTPATIENT_CLINIC_OR_DEPARTMENT_OTHER)
Admission: RE | Admit: 2020-06-02 | Discharge: 2020-06-02 | Disposition: A | Discharge status: Home / Self Care | Payer: Medicare HMO | Source: Ambulatory Visit | Attending: Family | Admitting: Family

## 2020-06-02 ENCOUNTER — Encounter: Payer: Self-pay | Admitting: Family

## 2020-06-02 ENCOUNTER — Ambulatory Visit (INDEPENDENT_AMBULATORY_CARE_PROVIDER_SITE_OTHER): Payer: Medicare HMO | Admitting: Family

## 2020-06-02 VITALS — BP 143/53 | HR 55 | Temp 97.9°F | Resp 16 | Ht 63.0 in | Wt 208.0 lb

## 2020-06-02 DIAGNOSIS — I517 Cardiomegaly: Secondary | ICD-10-CM | POA: Diagnosis not present

## 2020-06-02 DIAGNOSIS — R0789 Other chest pain: Secondary | ICD-10-CM | POA: Diagnosis not present

## 2020-06-02 DIAGNOSIS — R079 Chest pain, unspecified: Secondary | ICD-10-CM

## 2020-06-02 DIAGNOSIS — J90 Pleural effusion, not elsewhere classified: Secondary | ICD-10-CM | POA: Diagnosis not present

## 2020-06-02 DIAGNOSIS — J9811 Atelectasis: Secondary | ICD-10-CM | POA: Diagnosis not present

## 2020-06-02 NOTE — Patient Instructions (Addendum)
You should be contacted about setting up your appointment with Dr. Johnsie Cancel. Please complete chest x-ray on the first floor. Go to ER if you develop severe/recurrent chest pain or shortness of breath.

## 2020-06-02 NOTE — Progress Notes (Signed)
Subjective:    Patient ID: Jackie Stevens, female    DOB: February 21, 1946, 73 y.o.   MRN: [id number]  HPI  Patient is a 74 yr old female who presents today with report of intermittent chest pain/tightness.  pmhx is significant for previous hx of chest pain. She had a stress test performed back in 12/21/18. Reports that pain started about 1 month ago. Pain is sharp in nature.  Occurs on the left side of the chest. Usually occurs at rest.  Denies associated jaw pain or arm pain. Last episode was about 1 week ago.  Denies SOB.  She denies palpitations.    Review of Systems    see HPI  Past Medical History:  Diagnosis Date  . Abnormal cervical cytology 10/25/2012   Follows with Dr Leavy Cella of Gyn  . Allergic rhinitis 11/04/2010   Qualifier: Diagnosis of  By: Annamaria Boots MD, Clinton D   . Anemia 10/06/2013  . Anginal pain    pt has history of CP states had cardiac workup with no specific issues identified   . Arthritis    knees; right thumb; shoulders  . 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  Loss of glenohumeral joint space.  Well-maintained subacromial space.  Injected 09/30/2016 [name] .  Marland Kitchen Asthma, mild intermittent 04/02/2013  . Chicken pox as a child  . Colonic diverticular abscess 11/21/2012  . Constipation   . Decreased hearing   . Dermatitis 03/06/2017  . Diverticulitis 10/25/2012   pt. reports that a drain was placed - 09/2012    . Diverticulitis of rectosigmoid 11/28/2012  . Dry mouth   . Essential hypertension 11/08/2010   Qualifier: Diagnosis of  By: Annamaria Boots MD, Clinton D   . Excessive thirst   . External hemorrhoid, bleeding    "sometimes" (Jan 04, 2013)  . Frequent urination   . GERD (gastroesophageal reflux disease)   . H/O hiatal hernia   . Hand tingling   . Heart murmur   . Hyperglycemia 01/17/2020  . Hyperlipidemia   .  Hypothyroidism   . Incontinence   . Insomnia   . Kidney stones 1970's   "passed on their own" (2013/01/04)  . Low back pain 06/09/2014  . Measles as a child  . 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 Obesity 09/11/2017  . Obstructive sleep apnea 11/04/2010   NPSG [facility] 07/15/10- AHI 13.5/ hr CPAP 10/APS    . Otitis media 01/07/2014  . Palpitations   . Paresthesia 03/23/2015   Left face  . PONV (postoperative nausea and vomiting)   . Rheumatoid arteritis   . Shortness of breath dyspnea    using stairs  . Sinus pain   . Swelling of both lower extremities   . Thyroid cancer 1980's  . Tinnitus   . UTI (urinary tract infection) 04/02/2013  . Vertigo 08/05/2019  . Vitamin D deficiency 02/28/2018     Social History   Socioeconomic History  . Marital status: Married    Spouse name: [name]  . Number of children: 2  . Years of education: 75  . Highest education level: High school graduate  Occupational History  . Occupation: Retired  Tobacco Use  . Smoking status: Never Smoker  . Smokeless tobacco: Never Used  Vaping Use  . Vaping Use: Never used  Substance and Sexual Activity  . Alcohol use: No    Alcohol/week: 0.0 standard drinks  .  Drug use: No  . Sexual activity: Not Currently  Other Topics Concern  . Not on file  Social History Narrative   Married    Children   Right handed   Some college   Social Determinants of Health   Financial Resource Strain: Low Risk   . Difficulty of Paying Living Expenses: Not hard at all  Food Insecurity: No Food Insecurity  . Worried About Charity fundraiser in the Last Year: Never true  . Ran Out of Food in the Last Year: Never true  Transportation Needs: No Transportation Needs  . Lack of Transportation (Medical): No  . Lack of Transportation (Non-Medical): No  Physical Activity:   . Days of Exercise per Week:   . Minutes of Exercise per Session:   Stress:   . Feeling of Stress :   Social Connections:   . Frequency of  Communication with Friends and Family:   . Frequency of Social Gatherings with Friends and Family:   . Attends Religious Services:   . Active Member of Clubs or Organizations:   . Attends Archivist Meetings:   Marland Kitchen Marital Status:   Intimate Partner Violence:   . Fear of Current or Ex-Partner:   . Emotionally Abused:   Marland Kitchen Physically Abused:   . Sexually Abused:     Past Surgical History:  Procedure Laterality Date  . CHOLECYSTECTOMY  1990  . COLON SURGERY    . COLOSTOMY REVISION  12/19/2012   Procedure: COLON RESECTION SIGMOID;  Surgeon: Gwenyth Ober, MD;  Location: [facility];  Service: General;  Laterality: N/A;  . CYSTOSCOPY WITH STENT PLACEMENT  12/19/2012   Procedure: CYSTOSCOPY WITH STENT PLACEMENT;  Surgeon: Hanley Ben, MD;  Location: [facility];  Service: Urology;  Laterality: N/A;  . DILATION AND CURETTAGE OF UTERUS  1960's   "lots of them; had miscarriages" (12/19/2012)  . LYSIS OF ADHESION N/A 12/02/2015   Procedure: LAPAROSCOPIC LYSIS OF ADHESION;  Surgeon: Johnathan Hausen, MD;  Location: [facility];  Service: General;  Laterality: N/A;  . ROBOTIC ASSISTED BILATERAL SALPINGO OOPHERECTOMY Bilateral 12/02/2015   Procedure: XI ROBOTIC ASSISTED BILATERAL SALPINGO OOPHORECTOMY;  Surgeon: Everitt Amber, MD;  Location: [facility];  Service: Gynecology;  Laterality: Bilateral;  . SIGMOID RESECTION / RECTOPEXY  12/19/2012  . THYROIDECTOMY, PARTIAL  1988   "then did iodine to remove the rest" (12/19/2012)  . TONSILLECTOMY  1951?  . TRANSRECTAL DRAINAGE OF PELVIC ABSCESS  10/27/2012  . VAGINAL HYSTERECTOMY  1970's   "still have my ovaries" (12/19/2012)    Family History  Problem Relation Age of Onset  . Heart disease Father   . Pneumonia Father   . Hypertension Father   . Hyperlipidemia Father   . Cancer Father        skin  . Stroke Father   . Alzheimer's disease Mother   . Heart disease Mother   . Depression Mother   . Emphysema Brother        marijuana and cigarettes  . Alcohol abuse  Brother   . Hearing loss Brother   . Diabetes Maternal Grandmother   . Alzheimer's disease Paternal Grandmother   . Cancer Paternal Grandmother        lung?- smoker  . Hyperlipidemia Paternal Grandmother   . Heart attack Paternal Grandfather   . Alcohol abuse Paternal Grandfather   . Neurofibromatosis Son        schwanomatosis  . Neurofibromatosis Son        swanomatosis  .  Cancer Paternal Aunt     Allergies  Allergen Reactions  . Neomycin-Bacitracin Zn-Polymyx Rash    Polysporin- is tolerated   . Niacin Other (See Comments) and Cough    "cough til I threw up" (12/19/2012)  . Ciprofloxacin Hives    Got cipro and flagyl at same time, localized hives to IV arm  . Flagyl [Metronidazole] Hives    Got cipro and flagyl at same time, localized hives to IV arm    Current Outpatient Medications on File Prior to Visit  Medication Sig Dispense Refill  . azelastine (ASTELIN) 0.1 % nasal spray PLACE 2 SPRAYS INTO BOTH NOSTRILS 2 (TWO) TIMES DAILY. 30 mL 1  . famotidine (PEPCID) 20 MG tablet TAKE 2 TABLETS BY MOUTH EVERY DAY 180 tablet 1  . fluticasone (FLONASE) 50 MCG/ACT nasal spray Allergy Relief (fluticasone) 50 mcg/actuation nasal spray,suspension  Spray 1 spray every day by intranasal route.    . hydrochlorothiazide (HYDRODIURIL) 25 MG tablet TAKE 1 TABLET BY MOUTH EVERY DAY 90 tablet 1  . KETOCONAZOLE, TOPICAL, 1 % SHAM Apply 1 Dose topically once a week. 200 mL 1  . levothyroxine (SYNTHROID, LEVOTHROID) 150 MCG tablet Take 150 mcg by mouth daily before breakfast.    . losartan (COZAAR) 100 MG tablet TAKE 1 TABLET BY MOUTH EVERYDAY AT BEDTIME 90 tablet 1  . meclizine (ANTIVERT) 25 MG tablet Take 1 tablet (25 mg total) by mouth 3 (three) times daily as needed for dizziness. 30 tablet 1  . metoprolol tartrate (LOPRESSOR) 100 MG tablet TAKE 1.5 TABLETS (150 MG TOTAL) BY MOUTH 2 (TWO) TIMES DAILY. 270 tablet 1  . Multiple Vitamins-Minerals (MULTIVITAMIN WOMEN 50+ PO) Take 1 tablet by mouth  daily.    . naproxen (NAPROSYN) 375 MG tablet TAKE 1 TABLET BY MOUTH 2 (TWO) TIMES DAILY WITH A MEAL. 180 tablet 1  . nitroGLYCERIN (NITROSTAT) 0.4 MG SL tablet PLACE 1 TABLET UNDER THE TONGUE EVERY 5 MINUTES AS NEEDED FOR CHEST PAIN 25 tablet 1  . NONFORMULARY OR COMPOUNDED ITEM Antifungal solution: Terbinafine 3%, Fluconazole 2%, Tea Tree Oil 5%, Urea 10%, Ibuprofen 2% in DMSO suspension #70mL 1 each 3  . omeprazole (PRILOSEC) 40 MG capsule TAKE 1 CAPSULE BY MOUTH TWICE A DAY 180 capsule 1  . prednisoLONE acetate (PRED FORTE) 1 % ophthalmic suspension PLACE 4 DROPS IN BOTH EARS TWICE DAILY AS NEEDED FOR ITCHING    . rosuvastatin (CRESTOR) 40 MG tablet TAKE 1 TABLET BY MOUTH EVERY DAY 90 tablet 1  . Vitamin D, Cholecalciferol, 1000 UNITS CAPS Take 1 capsule by mouth daily.     No current facility-administered medications on file prior to visit.    BP (!) 143/53 (BP Location: Right Arm, Patient Position: Sitting, Cuff Size: Large)   Pulse (!) 55   Temp 97.9 F (36.6 C) (Temporal)   Resp 16   Ht 5\' 3"  (1.6 m)   Wt 208 lb (94.3 kg)   SpO2 99%   BMI 36.85 kg/m    Objective:   Physical Exam Constitutional:      Appearance: She is well-developed.  Neck:     Thyroid: No thyromegaly.  Cardiovascular:     Rate and Rhythm: Normal rate and regular rhythm.     Heart sounds: Normal heart sounds. No murmur heard.   Pulmonary:     Effort: Pulmonary effort is normal. No respiratory distress.     Breath sounds: Normal breath sounds. No wheezing.  Musculoskeletal:     Cervical back: Neck supple.  Comments: No reproducible anterior chest wall tenderness  Skin:    General: Skin is warm and dry.  Neurological:     Mental Status: She is alert and oriented to person, place, and time.  Psychiatric:        Behavior: Behavior normal.        Thought Content: Thought content normal.        Judgment: Judgment normal.           Assessment & Plan:  Atypical chest pain- EKG tracing is  personally reviewed.  EKG notes NSR.  No acute changes.  Will refer back to Dr. Johnsie Cancel (cardiologist) and obtain baseline chest x-ray. She is advised as follows:  Go to ER if you develop severe/recurrent chest pain or shortness of breath.   This visit occurred during the SARS-CoV-2 public health emergency.  Safety protocols were in place, including screening questions prior to the visit, additional usage of staff PPE, and extensive cleaning of exam room while observing appropriate contact time as indicated for disinfecting solutions.

## 2020-06-03 NOTE — Progress Notes (Signed)
Cardiology Office Note   Date:  06/04/2020   ID:  Jackie Stevens 07-Jul-1946, MRN [id number]  PCP:  Mosie Lukes, MD  Cardiologist:   Jenkins Rouge, MD   No chief complaint on file.     History of Present Illness: Jackie Stevens is a 74 y.o. female last seen 10/11/18 for chest pain Referred by Dr Charlett Blake Reviewed her office note from 11/07/18 Seen in ER 11/14 with atypical chest pain. History of HTN, HLD GERD Intermittent left sided chest pain. Felt like bruising. Pain worse at night and after eating not worse with exertion. Does not come on when active walking raking yard or other activity. Similar issues years ago related to indigestion In ER ECG normal , r/o CXR NAD   She has had normal myovue in 2012. TTE done 03/10/18 reviewed done for dyspnea EF 60-65% grade one diastolic no valve disease and aortic root measures 40 mm  Her pain is atypical fleeting lasting minutes often non exertional   Myovue 12/21/18 normal no ischemia EF 67% Carotid duplex 12/21/18 plaque no stenosis   Seen by primary 06/02/20 complained of resting sharp pain in chest for 4 weeks left sided No associated palpitations dyspnea diaphoresis or radiation CXR with CE right basilar atelectasis ECG SB rate 52 normal no acute changes   Some bruising on arms lab work including Hct/PLT ok in March  Has had COVID vaccine    Past Medical History:  Diagnosis Date  . Abnormal cervical cytology 10/25/2012   Follows with Dr Leavy Cella of Gyn  . Allergic rhinitis 11/04/2010   Qualifier: Diagnosis of  By: Annamaria Boots MD, Clinton D   . Anemia 10/06/2013  . Anginal pain    pt has history of CP states had cardiac workup with no specific issues identified   . Arthritis    knees; right thumb; shoulders  . 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  Loss of glenohumeral joint space.   Well-maintained subacromial space.  Injected 09/30/2016 [name] .  Marland Kitchen Asthma, mild intermittent 04/02/2013  . Chicken pox as a child  . Colonic diverticular abscess 11/21/2012  . Constipation   . Decreased hearing   . Dermatitis 03/06/2017  . Diverticulitis 10/25/2012   pt. reports that a drain was placed - 09/2012    . Diverticulitis of rectosigmoid 11/28/2012  . Dry mouth   . Essential hypertension 11/08/2010   Qualifier: Diagnosis of  By: Annamaria Boots MD, Clinton D   . Excessive thirst   . External hemorrhoid, bleeding    "sometimes" (Jan 15, 2013)  . Frequent urination   . GERD (gastroesophageal reflux disease)   . H/O hiatal hernia   . Hand tingling   . Heart murmur   . Hyperglycemia 01/17/2020  . Hyperlipidemia   . Hypothyroidism   . Incontinence   . Insomnia   . Kidney stones 1970's   "passed on their own" (01/15/2013)  . Low back pain 06/09/2014  . Measles as a child  . 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 Obesity 09/11/2017  . Obstructive sleep apnea 11/04/2010   NPSG [facility] 07/15/10- AHI 13.5/ hr CPAP 10/APS    . Otitis media 01/07/2014  . Palpitations   . Paresthesia 03/23/2015   Left face  . PONV (postoperative nausea and vomiting)   . Rheumatoid arteritis   . Shortness of breath dyspnea    using stairs  .  Sinus pain   . Swelling of both lower extremities   . Thyroid cancer 1980's  . Tinnitus   . UTI (urinary tract infection) 04/02/2013  . Vertigo 08/05/2019  . Vitamin D deficiency 02/28/2018    Past Surgical History:  Procedure Laterality Date  . CHOLECYSTECTOMY  1990  . COLON SURGERY    . COLOSTOMY REVISION  12/19/2012   Procedure: COLON RESECTION SIGMOID;  Surgeon: Gwenyth Ober, MD;  Location: [facility];  Service: General;  Laterality: N/A;  . CYSTOSCOPY WITH STENT PLACEMENT  12/19/2012   Procedure: CYSTOSCOPY WITH STENT PLACEMENT;  Surgeon: Hanley Ben, MD;  Location: [facility];  Service: Urology;  Laterality: N/A;  . DILATION AND CURETTAGE OF UTERUS  1960's   "lots of  them; had miscarriages" (12/19/2012)  . LYSIS OF ADHESION N/A 12/02/2015   Procedure: LAPAROSCOPIC LYSIS OF ADHESION;  Surgeon: Johnathan Hausen, MD;  Location: [facility];  Service: General;  Laterality: N/A;  . ROBOTIC ASSISTED BILATERAL SALPINGO OOPHERECTOMY Bilateral 12/02/2015   Procedure: XI ROBOTIC ASSISTED BILATERAL SALPINGO OOPHORECTOMY;  Surgeon: Everitt Amber, MD;  Location: [facility];  Service: Gynecology;  Laterality: Bilateral;  . SIGMOID RESECTION / RECTOPEXY  12/19/2012  . THYROIDECTOMY, PARTIAL  1988   "then did iodine to remove the rest" (12/19/2012)  . TONSILLECTOMY  1951?  . TRANSRECTAL DRAINAGE OF PELVIC ABSCESS  10/27/2012  . VAGINAL HYSTERECTOMY  1970's   "still have my ovaries" (12/19/2012)     Current Outpatient Medications  Medication Sig Dispense Refill  . azelastine (ASTELIN) 0.1 % nasal spray PLACE 2 SPRAYS INTO BOTH NOSTRILS 2 (TWO) TIMES DAILY. 30 mL 1  . famotidine (PEPCID) 20 MG tablet TAKE 2 TABLETS BY MOUTH EVERY DAY 180 tablet 1  . fluticasone (FLONASE) 50 MCG/ACT nasal spray Allergy Relief (fluticasone) 50 mcg/actuation nasal spray,suspension  Spray 1 spray every day by intranasal route.    . hydrochlorothiazide (HYDRODIURIL) 25 MG tablet TAKE 1 TABLET BY MOUTH EVERY DAY 90 tablet 1  . KETOCONAZOLE, TOPICAL, 1 % SHAM Apply 1 Dose topically once a week. 200 mL 1  . levothyroxine (SYNTHROID, LEVOTHROID) 150 MCG tablet Take 150 mcg by mouth daily before breakfast.    . losartan (COZAAR) 100 MG tablet TAKE 1 TABLET BY MOUTH EVERYDAY AT BEDTIME 90 tablet 1  . meclizine (ANTIVERT) 25 MG tablet Take 1 tablet (25 mg total) by mouth 3 (three) times daily as needed for dizziness. 30 tablet 1  . metoprolol tartrate (LOPRESSOR) 100 MG tablet TAKE 1.5 TABLETS (150 MG TOTAL) BY MOUTH 2 (TWO) TIMES DAILY. 270 tablet 1  . Multiple Vitamins-Minerals (MULTIVITAMIN WOMEN 50+ PO) Take 1 tablet by mouth daily.    . naproxen (NAPROSYN) 375 MG tablet TAKE 1 TABLET BY MOUTH 2 (TWO) TIMES DAILY  WITH A MEAL. 180 tablet 1  . nitroGLYCERIN (NITROSTAT) 0.4 MG SL tablet PLACE 1 TABLET UNDER THE TONGUE EVERY 5 MINUTES AS NEEDED FOR CHEST PAIN 25 tablet 1  . NONFORMULARY OR COMPOUNDED ITEM Antifungal solution: Terbinafine 3%, Fluconazole 2%, Tea Tree Oil 5%, Urea 10%, Ibuprofen 2% in DMSO suspension #23mL 1 each 3  . omeprazole (PRILOSEC) 40 MG capsule TAKE 1 CAPSULE BY MOUTH TWICE A DAY 180 capsule 1  . prednisoLONE acetate (PRED FORTE) 1 % ophthalmic suspension PLACE 4 DROPS IN BOTH EARS TWICE DAILY AS NEEDED FOR ITCHING    . rosuvastatin (CRESTOR) 40 MG tablet TAKE 1 TABLET BY MOUTH EVERY DAY 90 tablet 1  . Vitamin D, Cholecalciferol, 1000 UNITS CAPS  Take 1 capsule by mouth daily.     No current facility-administered medications for this visit.    Allergies:   Neomycin-bacitracin zn-polymyx, Niacin, Ciprofloxacin, and Flagyl [metronidazole]    Social History:  The patient  reports that she has never smoked. She has never used smokeless tobacco. She reports that she does not drink alcohol and does not use drugs.   Family History:  The patient's family history includes Alcohol abuse in her brother and paternal grandfather; Alzheimer's disease in her mother and paternal grandmother; Cancer in her father, paternal aunt, and paternal grandmother; Depression in her mother; Diabetes in her maternal grandmother; Emphysema in her brother; Hearing loss in her brother; Heart attack in her paternal grandfather; Heart disease in her father and mother; Hyperlipidemia in her father and paternal grandmother; Hypertension in her father; Neurofibromatosis in her son and son; Pneumonia in her father; Stroke in her father.    ROS:  Please see the history of present illness.   Otherwise, review of systems are positive for none.   All other systems are reviewed and negative.    PHYSICAL EXAM: VS:  BP (!) 146/84   Pulse 62   Ht 5\' 3"  (1.6 m)   Wt 209 lb (94.8 kg)   SpO2 98%   BMI 37.02 kg/m  , BMI Body  mass index is 37.02 kg/m. Affect appropriate Healthy:  appears stated age 2: normal Neck supple with no adenopathy JVP normal no bruits no thyromegaly Lungs clear with no wheezing and good diaphragmatic motion Heart:  S1/S2 no murmur, no rub, gallop or click PMI normal Abdomen: benighn, BS positve, no tenderness, no AAA no bruit.  No HSM or HJR Distal pulses intact with no bruits No edema Neuro non-focal Skin warm and dry No muscular weakness    EKG:  10/27/18 SR LAD poor R wave progression 06/02/20 SB rate 52 normal    Recent Labs: 02/12/2020: ALT 26; BUN 19; Creatinine, Ser 0.73; Hemoglobin 12.9; Platelets 258.0; Potassium 4.1; Sodium 136; TSH 1.07    Lipid Panel    Component Value Date/Time   CHOL 118 05/16/2020 0747   TRIG 78.0 05/16/2020 0747   HDL 47.00 05/16/2020 0747   CHOLHDL 3 05/16/2020 0747   VLDL 15.6 05/16/2020 0747   LDLCALC 55 05/16/2020 0747   LDLDIRECT 81.0 02/12/2020 1028      Wt Readings from Last 3 Encounters:  06/04/20 209 lb (94.8 kg)  06/02/20 208 lb (94.3 kg)  05/29/20 209 lb 12.8 oz (95.2 kg)      Other studies Reviewed: Additional studies/ records that were reviewed today include: Notes from ER November CXR, labs, ECG, Myovue 2012 TTE March 2019 Primary care notes .    ASSESSMENT AND PLAN:  1.  Chest Pain: atypical history of normal myovue in 2012 Myovue 12/21/18 normal no ischemia EF 67% Recurrent pain again atypical with normal ECG Her pain is totally atypical and non cardiac no testing needed  2. HTN:  Well controlled.  Continue current medications and low sodium Dash type diet.   3. HLD:  On statin labs with primary  4. Thyroid:  On replacement normal TSH  5. Bruit:  Bilateral   Duplex 12/20/18 plaque no stenosis    Current medicines are reviewed at length with the patient today.  The patient does not have concerns regarding medicines.  The following changes have been made:  no change  Labs/ tests ordered today include:   None   No orders of the defined types  were placed in this encounter.    Disposition:   FU with me PRN      Signed, Jenkins Rouge, MD  06/04/2020 2:24 PM    [facility] [address], [place], [place]  [address] Phone: [phone]; Fax: [phone]

## 2020-06-04 ENCOUNTER — Other Ambulatory Visit: Payer: Self-pay

## 2020-06-04 ENCOUNTER — Encounter: Payer: Self-pay | Admitting: Cardiovascular Disease

## 2020-06-04 ENCOUNTER — Ambulatory Visit: Payer: Medicare HMO | Admitting: Cardiovascular Disease

## 2020-06-04 VITALS — BP 146/84 | HR 62 | Ht 63.0 in | Wt 209.0 lb

## 2020-06-04 DIAGNOSIS — R079 Chest pain, unspecified: Secondary | ICD-10-CM | POA: Diagnosis not present

## 2020-06-04 NOTE — Patient Instructions (Signed)
Medication Instructions:  *If you need a refill on your cardiac medications before your next appointment, please call your pharmacy*  Lab Work: If you have labs (blood work) drawn today and your tests are completely normal, you will receive your results only by: . MyChart Message (if you have MyChart) OR . A paper copy in the mail If you have any lab test that is abnormal or we need to change your treatment, we will call you to review the results.  Testing/Procedures: None ordered today.   Follow-Up: At [facility], you and your health needs are our priority.  As part of our continuing mission to provide you with exceptional heart care, we have created designated Provider Care Teams.  These Care Teams include your primary Cardiologist (physician) and Advanced Practice Providers (APPs -  Physician Assistants and Nurse Practitioners) who all work together to provide you with the care you need, when you need it.  We recommend signing up for the patient portal called "MyChart".  Sign up information is provided on this After Visit Summary.  MyChart is used to connect with patients for Virtual Visits (Telemedicine).  Patients are able to view lab/test results, encounter notes, upcoming appointments, etc.  Non-urgent messages can be sent to your provider as well.   To learn more about what you can do with MyChart, go to [email or web address].    Your next appointment:   6 month(s)  The format for your next appointment:   In Person  Provider:   You may see Dr. Nishan or one of the following Advanced Practice Providers on your designated Care Team:    Lori Gerhardt, NP  Laura Ingold, NP  Jill McDaniel, NP    

## 2020-06-07 ENCOUNTER — Other Ambulatory Visit (HOSPITAL_COMMUNITY)
Admission: RE | Admit: 2020-06-07 | Discharge: 2020-06-07 | Disposition: A | Discharge status: Home / Self Care | Payer: Medicare HMO | Source: Ambulatory Visit | Attending: Internal Medicine | Admitting: Internal Medicine

## 2020-06-07 DIAGNOSIS — Z01812 Encounter for preprocedural laboratory examination: Secondary | ICD-10-CM | POA: Diagnosis not present

## 2020-06-07 DIAGNOSIS — Z20822 Contact with and (suspected) exposure to covid-19: Secondary | ICD-10-CM | POA: Diagnosis not present

## 2020-06-07 LAB — SARS CORONAVIRUS 2 (TAT 6-24 HRS): SARS Coronavirus 2: NEGATIVE

## 2020-06-10 ENCOUNTER — Other Ambulatory Visit (HOSPITAL_BASED_OUTPATIENT_CLINIC_OR_DEPARTMENT_OTHER): Payer: Medicare HMO | Admitting: Pulmonary Disease

## 2020-06-10 ENCOUNTER — Other Ambulatory Visit: Payer: Self-pay | Admitting: Family Medicine

## 2020-06-10 ENCOUNTER — Ambulatory Visit (HOSPITAL_BASED_OUTPATIENT_CLINIC_OR_DEPARTMENT_OTHER): Payer: Medicare HMO | Attending: Internal Medicine | Admitting: Pulmonary Disease

## 2020-06-10 DIAGNOSIS — G4733 Obstructive sleep apnea (adult) (pediatric): Secondary | ICD-10-CM

## 2020-06-12 ENCOUNTER — Other Ambulatory Visit: Payer: Self-pay

## 2020-06-16 NOTE — Procedures (Signed)
    She wanted a mask with a strap on top of the head.  She was shown a [employer], but did not like the nasal aspect of the interface.  She decided to continue with her previous mask.  Chesley Mires, MD [place] Pager - [phone] 06/16/2020, 11:58 AM

## 2020-06-19 DIAGNOSIS — G4733 Obstructive sleep apnea (adult) (pediatric): Secondary | ICD-10-CM | POA: Diagnosis not present

## 2020-06-21 ENCOUNTER — Other Ambulatory Visit: Payer: Self-pay | Admitting: Family Medicine

## 2020-06-21 DIAGNOSIS — R059 Cough, unspecified: Secondary | ICD-10-CM

## 2020-06-21 DIAGNOSIS — J302 Other seasonal allergic rhinitis: Secondary | ICD-10-CM

## 2020-06-21 DIAGNOSIS — K219 Gastro-esophageal reflux disease without esophagitis: Secondary | ICD-10-CM

## 2020-06-24 ENCOUNTER — Encounter: Payer: Self-pay | Admitting: Neurology

## 2020-06-24 ENCOUNTER — Ambulatory Visit: Payer: Medicare HMO | Admitting: Neurology

## 2020-06-24 ENCOUNTER — Other Ambulatory Visit: Payer: Self-pay

## 2020-06-24 VITALS — BP 152/74 | HR 64 | Resp 18 | Ht 61.0 in | Wt 208.0 lb

## 2020-06-24 DIAGNOSIS — G3184 Mild cognitive impairment, so stated: Secondary | ICD-10-CM | POA: Diagnosis not present

## 2020-06-24 MED ORDER — DONEPEZIL HCL 10 MG PO TABS
ORAL_TABLET | ORAL | 11 refills | Status: DC
Start: 2020-06-24 — End: 2021-01-20

## 2020-06-24 NOTE — Progress Notes (Signed)
NEUROLOGY FOLLOW UP OFFICE NOTE  Jackie Stevens [id number] 1946-07-08  HISTORY OF PRESENT ILLNESS: I had the pleasure of seeing Jackie Stevens in follow-up in the neurology clinic on 06/24/2020.  The patient was last seen 7 months ago for worsening memory. She is again accompanied by her husband who helps supplement the history today.  Records and images were personally reviewed where available.  I personally reviewed MRI brain without contrast done 12/2019 which did not show any acute changes. There was mild to moderate diffuse atrophy with no lobar predominance seen. Note of prominence of lateral and third ventricles slightly increased since 2016, favored secondary to parenchymal volume loss. She underwent Neuropsychological testing last February 2021 with a diagnosis of Mild Neurocognitive Disorder. There were primary impairments in new learning and memory, semantic fluency, and confrontation naming. Etiology of deficits unclear, pattern of performance was somewhat concerning for early stages of Alzheimer's disease, however other scores were not wholly suggestive of a memory storage deficit, which is inconsistent with what is expected with AD.   Since her last visit, her husband reports that she repeats herself. She continues to manage her own medications and bills without difficulties. She rarely drives, and only drives short distances without issues. Her husband reports she used to cook for large groups but now does not cook anymore. She has lost her sense of taste. She had to write down 2 favorite recipes. Her husband was previously reporting paranoia, she continues to have an irrational fear that someone will shoot them in their home, she closes the blinds and turns the lights off, feeling someone is watching her. She has also been having auditory hallucinations, saying she hears a voice saying the same thing repeatedly. She cannot remember the word they say. Her husband says she head voices the other  night or asks him if he hears the people outside the bedroom. It appears to be related to her CPAP machine, it makes a noise and she interprets the noise as a voice. The voice stops when she stops her machine, it only occurs at night. She denies any headaches, dizziness, no falls. She has left knee pain and uses her walker majority of the time.   History on Initial Assessment 11/26/2019: This is a pleasant 74 year old right-handed woman with a history of hypertension, hyperlipidemia, thyroid cancer s/p partial thyroidectomy, presenting for evaluation of memory loss. She does not think her memory is good, there are certain things she cannot remember. She looks to her husband for answers several times during the visit. She lives with her husband and son. She manages medications without difficulties and denies getting lost driving. She became concerned about her memory because she had always used to do their checkbook but in February, something was not right. She could not figure it out, and stopped doing it since then. She states she is not even sure she can do it now. Her husband does not think there is anything significantly concerning about her memory. He reports the checkbook issue "threw her off." She gets flustered when she does not remember things. He has not noticed any personality changes but they both note that she is more paranoid, closing the blinds at night. They have lived in the same house for 37 years but for the past 6 months, she feels like someone is always looking in their house and will shoot at them, "I don't know why." She and her husband deny any hallucinations. She has always been worried about  things, and is anxious today. She states mood is "I don't know," she is afraid to do some things but thinks mood is alright. She notes some depression due to inability to travel like before. Her mother had Alzheimer's disease. No history of significant head injuries. She rarely drinks alcohol.    She denies any headaches, diplopia, dysarthria/dysphagia, neck/back pain, focal numbness/tingling/weakness, bowel/bladder dysfunction, tremors. She had a lot of dizziness for a time where she would feel lightheaded and have to hold on. I personally reviewed head CT without contrast done in August 2020 for dizziness which did not show any acute changes, there was moderate diffuse atrophy. She had an infection prior to the start of Covid-19 where she lost her sense of taste and smell, it has not come back. She usually gets 7 hours of sleep with her CPAP machine, waking up at 4am. She is occasionally drowsy in the day.     Laboratory Data: Lab Results  Component Value Date   TSH 1.07 02/12/2020   Lab Results  Component Value Date   [id number] 532 11/26/2019      PAST MEDICAL HISTORY: Past Medical History:  Diagnosis Date  . Abnormal cervical cytology 10/25/2012   Follows with Dr Leavy Cella of Gyn  . Allergic rhinitis 11/04/2010   Qualifier: Diagnosis of  By: Annamaria Boots MD, Clinton D   . Anemia 10/06/2013  . Anginal pain    pt has history of CP states had cardiac workup with no specific issues identified   . Arthritis    knees; right thumb; shoulders  . 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  Loss of glenohumeral joint space.  Well-maintained subacromial space.  Injected 09/30/2016 [name] .  Marland Kitchen Asthma, mild intermittent 04/02/2013  . Chicken pox as a child  . Colonic diverticular abscess 11/21/2012  . Constipation   . Decreased hearing   . Dermatitis 03/06/2017  . Diverticulitis 10/25/2012   pt. reports that a drain was placed - 09/2012    . Diverticulitis of rectosigmoid 11/28/2012  . Dry mouth   . Essential hypertension 11/08/2010   Qualifier: Diagnosis of  By: Annamaria Boots MD, Clinton D   . Excessive thirst   . External hemorrhoid, bleeding     "sometimes" (2013/01/15)  . Frequent urination   . GERD (gastroesophageal reflux disease)   . H/O hiatal hernia   . Hand tingling   . Heart murmur   . Hyperglycemia 01/17/2020  . Hyperlipidemia   . Hypothyroidism   . Incontinence   . Insomnia   . Kidney stones 1970's   "passed on their own" (Jan 15, 2013)  . Low back pain 06/09/2014  . Measles as a child  . 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 Obesity 09/11/2017  . Obstructive sleep apnea 11/04/2010   NPSG [facility] 07/15/10- AHI 13.5/ hr CPAP 10/APS    . Otitis media 01/07/2014  . Palpitations   . Paresthesia 03/23/2015   Left face  . PONV (postoperative nausea and vomiting)   . Rheumatoid arteritis   . Shortness of breath dyspnea    using stairs  . Sinus pain   . Swelling of both lower extremities   . Thyroid cancer 1980's  . Tinnitus   . UTI (urinary tract infection) 04/02/2013  . Vertigo 08/05/2019  . Vitamin D deficiency 02/28/2018    MEDICATIONS: Current Outpatient Medications on File Prior to Visit  Medication Sig Dispense  Refill  . azelastine (ASTELIN) 0.1 % nasal spray Place 2 sprays into both nostrils 2 (two) times daily. 30 mL 12  . famotidine (PEPCID) 20 MG tablet TAKE 2 TABLETS BY MOUTH EVERY DAY 180 tablet 1  . fluticasone (FLONASE) 50 MCG/ACT nasal spray Allergy Relief (fluticasone) 50 mcg/actuation nasal spray,suspension  Spray 1 spray every day by intranasal route.    . hydrochlorothiazide (HYDRODIURIL) 25 MG tablet TAKE 1 TABLET BY MOUTH EVERY DAY 90 tablet 1  . KETOCONAZOLE, TOPICAL, 1 % SHAM Apply 1 Dose topically once a week. 200 mL 1  . levothyroxine (SYNTHROID, LEVOTHROID) 150 MCG tablet Take 150 mcg by mouth daily before breakfast.    . losartan (COZAAR) 100 MG tablet TAKE 1 TABLET BY MOUTH EVERYDAY AT BEDTIME 90 tablet 1  . meclizine (ANTIVERT) 25 MG tablet Take 1 tablet (25 mg total) by mouth 3 (three) times daily as needed for dizziness. 30 tablet 1  . metoprolol tartrate (LOPRESSOR) 100 MG tablet TAKE 1.5  TABLETS (150 MG TOTAL) BY MOUTH 2 (TWO) TIMES DAILY. 270 tablet 1  . Multiple Vitamins-Minerals (MULTIVITAMIN WOMEN 50+ PO) Take 1 tablet by mouth daily.    . naproxen (NAPROSYN) 375 MG tablet TAKE 1 TABLET BY MOUTH 2 (TWO) TIMES DAILY WITH A MEAL. 180 tablet 1  . nitroGLYCERIN (NITROSTAT) 0.4 MG SL tablet PLACE 1 TABLET UNDER THE TONGUE EVERY 5 MINUTES AS NEEDED FOR CHEST PAIN 25 tablet 1  . NONFORMULARY OR COMPOUNDED ITEM Antifungal solution: Terbinafine 3%, Fluconazole 2%, Tea Tree Oil 5%, Urea 10%, Ibuprofen 2% in DMSO suspension #65mL 1 each 3  . omeprazole (PRILOSEC) 40 MG capsule TAKE 1 CAPSULE BY MOUTH TWICE A DAY 180 capsule 1  . prednisoLONE acetate (PRED FORTE) 1 % ophthalmic suspension PLACE 4 DROPS IN BOTH EARS TWICE DAILY AS NEEDED FOR ITCHING    . rosuvastatin (CRESTOR) 40 MG tablet TAKE 1 TABLET BY MOUTH EVERY DAY 90 tablet 1  . Vitamin D, Cholecalciferol, 1000 UNITS CAPS Take 1 capsule by mouth daily.     No current facility-administered medications on file prior to visit.    ALLERGIES: Allergies  Allergen Reactions  . Neomycin-Bacitracin Zn-Polymyx Rash    Polysporin- is tolerated   . Niacin Other (See Comments) and Cough    "cough til I threw up" (12/19/2012)  . Ciprofloxacin Hives    Got cipro and flagyl at same time, localized hives to IV arm  . Flagyl [Metronidazole] Hives    Got cipro and flagyl at same time, localized hives to IV arm    FAMILY HISTORY: Family History  Problem Relation Age of Onset  . Heart disease Father   . Pneumonia Father   . Hypertension Father   . Hyperlipidemia Father   . Cancer Father        skin  . Stroke Father   . Alzheimer's disease Mother   . Heart disease Mother   . Depression Mother   . Emphysema Brother        marijuana and cigarettes  . Alcohol abuse Brother   . Hearing loss Brother   . Diabetes Maternal Grandmother   . Alzheimer's disease Paternal Grandmother   . Cancer Paternal Grandmother        lung?- smoker  .  Hyperlipidemia Paternal Grandmother   . Heart attack Paternal Grandfather   . Alcohol abuse Paternal Grandfather   . Neurofibromatosis Son        schwanomatosis  . Neurofibromatosis Son  swanomatosis  . Cancer Paternal Aunt     SOCIAL HISTORY: Social History   Socioeconomic History  . Marital status: Married    Spouse name: [name]  . Number of children: 2  . Years of education: 60  . Highest education level: High school graduate  Occupational History  . Occupation: Retired  Tobacco Use  . Smoking status: Never Smoker  . Smokeless tobacco: Never Used  Vaping Use  . Vaping Use: Never used  Substance and Sexual Activity  . Alcohol use: No    Alcohol/week: 0.0 standard drinks  . Drug use: No  . Sexual activity: Not Currently  Other Topics Concern  . Not on file  Social History Narrative   Married    Children   Right handed   Some college   Social Determinants of Health   Financial Resource Strain: Low Risk   . Difficulty of Paying Living Expenses: Not hard at all  Food Insecurity: No Food Insecurity  . Worried About Charity fundraiser in the Last Year: Never true  . Ran Out of Food in the Last Year: Never true  Transportation Needs: No Transportation Needs  . Lack of Transportation (Medical): No  . Lack of Transportation (Non-Medical): No  Physical Activity:   . Days of Exercise per Week:   . Minutes of Exercise per Session:   Stress:   . Feeling of Stress :   Social Connections:   . Frequency of Communication with Friends and Family:   . Frequency of Social Gatherings with Friends and Family:   . Attends Religious Services:   . Active Member of Clubs or Organizations:   . Attends Archivist Meetings:   Marland Kitchen Marital Status:   Intimate Partner Violence:   . Fear of Current or Ex-Partner:   . Emotionally Abused:   Marland Kitchen Physically Abused:   . Sexually Abused:      PHYSICAL EXAM: Vitals:   06/24/20 1459  BP: (!) 152/74  Pulse: 64  Resp: 18    SpO2: 96%   General: No acute distress Head:  Normocephalic/atraumatic Skin/Extremities: No rash, no edema Neurological Exam: alert and oriented to person, place, and time. No aphasia or dysarthria. Fund of knowledge is appropriate.  Recent and remote memory are intact.  Attention and concentration are normal.  Cranial nerves: Pupils equal, round, reactive to light. Extraocular movements intact with no nystagmus. Visual fields full.  No facial asymmetry.   Motor: Bulk and tone normal, muscle strength 5/5 throughout with no pronator drift. Finger to nose testing intact.  Gait slow and cautious with walker, reporting left knee pain   IMPRESSION: This is a pleasant 74 yo RH woman with a history of hypertension, hyperlipidemia, thyroid cancer s/p partial thyroidectomy, with Mild Neurocognitive disorder. MRI brain no acute changes, there is mild to moderate diffuse atrophy with no lobar predominance. Etiology of MCI unclear, possibly early stages of Alzheimer's disease. She also appears to be having ?auditory hallucinations and paranoia. We discussed starting Donepezil, this can help with behavioral changes that occur as well. Side effects and expectations from medication discussed, start Donepezil 10mg  1/2 tab daily for 2 weeks, then increase to 1 tab daily. If anxiety/paranoia continues to worsen, we may add on an SSRI. Continue close supervision. Repeat Neuropsychological testing in February 2022 to assess trajectory. Follow-up in 6 months, they know to call for any changes.    Thank you for allowing me to participate in her care.  Please do not hesitate to  call for any questions or concerns.   Ellouise Newer, M.D.   CC: Dr. Charlett Blake

## 2020-06-24 NOTE — Patient Instructions (Addendum)
1. Start Donepezil 10mg : take 1/2 tablet daily for 2 weeks, then increase to 1 tablet daily   2. Follow-up in 6 months, call for any changes  FALL PRECAUTIONS: Be cautious when walking. Scan the area for obstacles that may increase the risk of trips and falls. When getting up in the mornings, sit up at the edge of the bed for a few minutes before getting out of bed. Consider elevating the bed at the head end to avoid drop of blood pressure when getting up. Walk always in a well-lit room (use night lights in the walls). Avoid area rugs or power cords from appliances in the middle of the walkways. Use a walker or a cane if necessary and consider physical therapy for balance exercise. Get your eyesight checked regularly.  FINANCIAL OVERSIGHT: Supervision, especially oversight when making financial decisions or transactions is also recommended as difficulties arise  HOME SAFETY: Consider the safety of the kitchen when operating appliances like stoves, microwave oven, and blender. Consider having supervision and share cooking responsibilities until no longer able to participate in those. Accidents with firearms and other hazards in the house should be identified and addressed as well.  DRIVING: Regarding driving, in patients with progressive memory problems, driving will be impaired. We advise to have someone else do the driving if trouble finding directions or if minor accidents are reported. Independent driving assessment is available to determine safety of driving.  ABILITY TO BE LEFT ALONE: If patient is unable to contact 911 operator, consider using LifeLine, or when the need is there, arrange for someone to stay with patients. Smoking is a fire hazard, consider supervision or cessation. Risk of wandering should be assessed by caregiver and if detected at any point, supervision and safe proof recommendations should be instituted.  MEDICATION SUPERVISION: Inability to self-administer medication needs to  be constantly addressed. Implement a mechanism to ensure safe administration of the medications.  RECOMMENDATIONS FOR ALL PATIENTS WITH MEMORY PROBLEMS: 1. Continue to exercise (Recommend 30 minutes of walking everyday, or 3 hours every week) 2. Increase social interactions - continue going to [place] and enjoy social gatherings with friends and family 3. Eat healthy, avoid fried foods and eat more fruits and vegetables 4. Maintain adequate blood pressure, blood sugar, and blood cholesterol level. Reducing the risk of stroke and cardiovascular disease also helps promoting better memory. 5. Avoid stressful situations. Live a simple life and avoid aggravations. Organize your time and prepare for the next day in anticipation. 6. Sleep well, avoid any interruptions of sleep and avoid any distractions in the bedroom that may interfere with adequate sleep quality 7. Avoid sugar, avoid sweets as there is a strong link between excessive sugar intake, diabetes, and cognitive impairment The Mediterranean diet has been shown to help patients reduce the risk of progressive memory disorders and reduces cardiovascular risk. This includes eating fish, eat fruits and green leafy vegetables, nuts like almonds and hazelnuts, walnuts, and also use olive oil. Avoid fast foods and fried foods as much as possible. Avoid sweets and sugar as sugar use has been linked to worsening of memory function.  There is always a concern of gradual progression of memory problems. If this is the case, then we may need to adjust level of care according to patient needs. Support, both to the patient and caregiver, should then be put into place.

## 2020-07-20 DIAGNOSIS — G4733 Obstructive sleep apnea (adult) (pediatric): Secondary | ICD-10-CM | POA: Diagnosis not present

## 2020-07-21 ENCOUNTER — Other Ambulatory Visit: Payer: Self-pay | Admitting: Family Medicine

## 2020-07-24 ENCOUNTER — Other Ambulatory Visit: Payer: Self-pay | Admitting: Family Medicine

## 2020-07-28 ENCOUNTER — Other Ambulatory Visit: Payer: Self-pay

## 2020-07-28 ENCOUNTER — Ambulatory Visit: Payer: Medicare HMO | Admitting: Podiatry

## 2020-07-28 DIAGNOSIS — M79676 Pain in unspecified toe(s): Secondary | ICD-10-CM

## 2020-07-28 DIAGNOSIS — M79674 Pain in right toe(s): Secondary | ICD-10-CM | POA: Diagnosis not present

## 2020-07-28 DIAGNOSIS — M79675 Pain in left toe(s): Secondary | ICD-10-CM

## 2020-07-28 DIAGNOSIS — Q828 Other specified congenital malformations of skin: Secondary | ICD-10-CM | POA: Diagnosis not present

## 2020-07-28 DIAGNOSIS — L84 Corns and callosities: Secondary | ICD-10-CM

## 2020-07-28 DIAGNOSIS — B351 Tinea unguium: Secondary | ICD-10-CM

## 2020-07-31 ENCOUNTER — Encounter: Payer: Self-pay | Admitting: Podiatry

## 2020-07-31 NOTE — Progress Notes (Signed)
Subjective: Jackie Stevens is a pleasant 74 y.o. female patient seen today painful corn(s) b/l feet and painful mycotic toenails b/l that are difficult to trim. Pain interferes with ambulation. Aggravating factors include wearing enclosed shoe gear. Pain is relieved with periodic professional debridement.   She states her left 3rd digit is most painful on today's visit.   Patient Active Problem List   Diagnosis Date Noted  . 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 Hyperglycemia 01/17/2020  . Vertigo 08/05/2019  . Palpitations 05/28/2019  . Other fatigue 02/28/2018  . Shortness of breath on exertion 02/28/2018  . Vitamin D deficiency 02/28/2018  . Obesity 09/11/2017  . Dermatitis 03/06/2017  . Arthritis of left shoulder region 09/30/2016  . Pain of joint of left ankle and foot 09/15/2016  . Left shoulder pain 09/14/2016  . Preventative health care 02/26/2016  . Radicular pain of sacrum 02/26/2016  . Vitamin B12 deficiency 02/26/2016  . Ventral hernia 12/02/2015  . Pelvic mass in female 12/02/2015  . Colitis 09/14/2015  . Paresthesia 03/23/2015  . Otitis, externa, infective 03/13/2015  . Hyponatremia 03/13/2015  . Cystitis 09/10/2014  . Headache 08/21/2014  . Low back pain 06/09/2014  . Otitis media 01/07/2014  . Anemia 10/06/2013  . Medicare annual wellness visit, subsequent 10/06/2013  . Asthma, mild intermittent 04/02/2013  . Internal hemorrhoid, bleeding 01/23/2013  . Diverticulitis of rectosigmoid 11/28/2012  . Colonic diverticular abscess 11/21/2012  . Abnormal cervical cytology 10/25/2012  . Cancer ([place])   . Insomnia   . Hypothyroidism   . Constipation   . Cough 09/02/2012  . Chest pain 08/05/2011  . Hyperlipidemia, mixed 11/08/2010  . Essential hypertension 11/08/2010  . GERD (gastroesophageal reflux disease) 11/08/2010  . Obstructive sleep apnea 11/04/2010  . Allergic rhinitis 11/04/2010    Current Outpatient Medications on File Prior to Visit   Medication Sig Dispense Refill  . azelastine (ASTELIN) 0.1 % nasal spray Place 2 sprays into both nostrils 2 (two) times daily. 30 mL 12  . donepezil (ARICEPT) 10 MG tablet Take 1/2 tablet daily for 2 weeks, then increase to 1 tablet daily 30 tablet 11  . famotidine (PEPCID) 20 MG tablet TAKE 2 TABLETS BY MOUTH EVERY DAY 180 tablet 1  . fluticasone (FLONASE) 50 MCG/ACT nasal spray Allergy Relief (fluticasone) 50 mcg/actuation nasal spray,suspension  Spray 1 spray every day by intranasal route.    . hydrochlorothiazide (HYDRODIURIL) 25 MG tablet TAKE 1 TABLET BY MOUTH EVERY DAY 90 tablet 0  . KETOCONAZOLE, TOPICAL, 1 % SHAM Apply 1 Dose topically once a week. 200 mL 1  . levothyroxine (SYNTHROID, LEVOTHROID) 150 MCG tablet Take 150 mcg by mouth daily before breakfast.    . losartan (COZAAR) 100 MG tablet TAKE 1 TABLET BY MOUTH EVERYDAY AT BEDTIME 90 tablet 1  . meclizine (ANTIVERT) 25 MG tablet Take 1 tablet (25 mg total) by mouth 3 (three) times daily as needed for dizziness. 30 tablet 1  . metoprolol tartrate (LOPRESSOR) 100 MG tablet TAKE 1.5 TABLETS (150 MG TOTAL) BY MOUTH 2 (TWO) TIMES DAILY. 270 tablet 1  . Multiple Vitamins-Minerals (MULTIVITAMIN WOMEN 50+ PO) Take 1 tablet by mouth daily.    . naproxen (NAPROSYN) 375 MG tablet TAKE 1 TABLET BY MOUTH 2 (TWO) TIMES DAILY WITH A MEAL. 180 tablet 1  . nitroGLYCERIN (NITROSTAT) 0.4 MG SL tablet PLACE 1 TABLET UNDER THE TONGUE EVERY 5 MINUTES AS NEEDED FOR CHEST PAIN 25 tablet 1  . NONFORMULARY OR COMPOUNDED ITEM Antifungal solution: Terbinafine 3%,  Fluconazole 2%, Tea Tree Oil 5%, Urea 10%, Ibuprofen 2% in DMSO suspension #97mL 1 each 3  . omeprazole (PRILOSEC) 40 MG capsule TAKE 1 CAPSULE BY MOUTH TWICE A DAY 180 capsule 1  . prednisoLONE acetate (PRED FORTE) 1 % ophthalmic suspension PLACE 4 DROPS IN BOTH EARS TWICE DAILY AS NEEDED FOR ITCHING    . rosuvastatin (CRESTOR) 40 MG tablet TAKE 1 TABLET BY MOUTH EVERY DAY 90 tablet 1  . Vitamin D,  Cholecalciferol, 1000 UNITS CAPS Take 1 capsule by mouth daily.     No current facility-administered medications on file prior to visit.    Allergies  Allergen Reactions  . Neomycin-Bacitracin Zn-Polymyx Rash    Polysporin- is tolerated   . Niacin Other (See Comments) and Cough    "cough til I threw up" (12/19/2012)  . Ciprofloxacin Hives    Got cipro and flagyl at same time, localized hives to IV arm  . Flagyl [Metronidazole] Hives    Got cipro and flagyl at same time, localized hives to IV arm    Objective: Physical Exam  General: Jackie Stevens is a pleasant 74 y.o. Caucasian female, in NAD. AAO x 3.   Vascular:  Capillary fill time to digits <3 seconds b/l lower extremities. Palpable DP pulses b/l. Palpable PT pulses b/l. Pedal hair present b/l. Skin temperature gradient within normal limits b/l. No pain with calf compression b/l.  Dermatological:  Pedal skin with normal turgor, texture and tone bilaterally. No open wounds bilaterally. No interdigital macerations bilaterally. Toenails 1-5 b/l elongated, discolored, dystrophic, thickened, crumbly with subungual debris and tenderness to dorsal palpation. Hyperkeratotic lesion(s) L 3rd toe and R 2nd toe.  No erythema, no edema, no drainage, no flocculence. Porokeratotic lesion(s) submet head 2 right foot and submet head 4 right foot. No erythema, no edema, no drainage, no flocculence.  Musculoskeletal:  Normal muscle strength 5/5 to all lower extremity muscle groups bilaterally. No pain crepitus or joint limitation noted with ROM b/l. Hallux valgus with bunion deformity noted b/l lower extremities. Hammertoes noted to the L 2nd toe and R 2nd toe.  Neurological:  Protective sensation intact 5/5 intact bilaterally with 10g monofilament b/l. Vibratory sensation intact b/l.  Assessment and Plan:  1. Pain due to onychomycosis of toenail   2. Porokeratosis   3. Corns   4. Pain in toes of both feet    -Examined patient. -Toenails 1-5  b/l were debrided in length and girth with sterile nail nippers and dremel without iatrogenic bleeding.  -Corn(s) L 3rd toe and R 2nd toe pared utilizing sterile scalpel blade without complication or incident. Total number debrided=2. -Painful porokeratotic lesion(s) submet head 2 right foot and submet head 4 right foot pared and enucleated with sterile scalpel blade without incident. -Patient to report any pedal injuries to medical professional immediately. -Patient to continue soft, supportive shoe gear daily. -Patient/POA to call should there be question/concern in the interim.  Return in about 9 weeks (around 09/29/2020) for nail and callus trim.  Marzetta Board, DPM

## 2020-08-01 DIAGNOSIS — R69 Illness, unspecified: Secondary | ICD-10-CM | POA: Diagnosis not present

## 2020-08-03 ENCOUNTER — Other Ambulatory Visit: Payer: Self-pay | Admitting: Family Medicine

## 2020-08-06 ENCOUNTER — Telehealth: Payer: Self-pay | Admitting: Neurology

## 2020-08-06 NOTE — Telephone Encounter (Signed)
Patient called wanting to know if Dr. Delice Lesch would recommend Prevagen OTC to help improve her memory.

## 2020-08-07 NOTE — Telephone Encounter (Signed)
Called and left message for a call back.

## 2020-08-07 NOTE — Telephone Encounter (Signed)
Pls let her know that I do not recommend Prevagen. It is not FDA-approved, no evidence to support it's claims, it is only an added cost with no clear benefits. What studies have found helpful for memory are things as simple as physical exercise, Mediterranean diet (healthy eating, ie fish, fruits, vegetables, nuts), and brain stimulation activities.   Can provide her with this website for cognitive stimulation: https://www.[facility].org/get-to-know-barrow/centers-programs/neurorehabilitation-center/neuro-rehab-apps-and-games/ which has options, categorized by level of difficulty.

## 2020-08-11 NOTE — Telephone Encounter (Signed)
Called patient and informed her of Dr. Amparo Bristol advice. Patient verbalized understanding.

## 2020-08-20 DIAGNOSIS — Z7689 Persons encountering health services in other specified circumstances: Secondary | ICD-10-CM | POA: Diagnosis not present

## 2020-08-20 DIAGNOSIS — R7302 Impaired glucose tolerance (oral): Secondary | ICD-10-CM | POA: Diagnosis not present

## 2020-08-20 DIAGNOSIS — G4730 Sleep apnea, unspecified: Secondary | ICD-10-CM | POA: Diagnosis not present

## 2020-08-20 DIAGNOSIS — I700 Atherosclerosis of aorta: Secondary | ICD-10-CM | POA: Diagnosis not present

## 2020-08-20 DIAGNOSIS — J45909 Unspecified asthma, uncomplicated: Secondary | ICD-10-CM | POA: Diagnosis not present

## 2020-08-20 DIAGNOSIS — E890 Postprocedural hypothyroidism: Secondary | ICD-10-CM | POA: Diagnosis not present

## 2020-08-20 DIAGNOSIS — G4733 Obstructive sleep apnea (adult) (pediatric): Secondary | ICD-10-CM | POA: Diagnosis not present

## 2020-08-20 DIAGNOSIS — C73 Malignant neoplasm of thyroid gland: Secondary | ICD-10-CM | POA: Diagnosis not present

## 2020-08-20 DIAGNOSIS — K5730 Diverticulosis of large intestine without perforation or abscess without bleeding: Secondary | ICD-10-CM | POA: Diagnosis not present

## 2020-08-20 DIAGNOSIS — E785 Hyperlipidemia, unspecified: Secondary | ICD-10-CM | POA: Diagnosis not present

## 2020-08-20 DIAGNOSIS — I10 Essential (primary) hypertension: Secondary | ICD-10-CM | POA: Diagnosis not present

## 2020-08-20 DIAGNOSIS — G3184 Mild cognitive impairment, so stated: Secondary | ICD-10-CM | POA: Diagnosis not present

## 2020-08-22 DIAGNOSIS — R69 Illness, unspecified: Secondary | ICD-10-CM | POA: Diagnosis not present

## 2020-08-22 DIAGNOSIS — G4733 Obstructive sleep apnea (adult) (pediatric): Secondary | ICD-10-CM | POA: Diagnosis not present

## 2020-09-03 DIAGNOSIS — Z1231 Encounter for screening mammogram for malignant neoplasm of breast: Secondary | ICD-10-CM | POA: Diagnosis not present

## 2020-09-03 LAB — HM MAMMOGRAPHY: HM Mammogram: NORMAL (ref 0–4)

## 2020-09-13 ENCOUNTER — Other Ambulatory Visit: Payer: Self-pay | Admitting: Family Medicine

## 2020-09-19 DIAGNOSIS — G4733 Obstructive sleep apnea (adult) (pediatric): Secondary | ICD-10-CM | POA: Diagnosis not present

## 2020-09-22 DIAGNOSIS — G4733 Obstructive sleep apnea (adult) (pediatric): Secondary | ICD-10-CM | POA: Diagnosis not present

## 2020-09-25 DIAGNOSIS — H2513 Age-related nuclear cataract, bilateral: Secondary | ICD-10-CM | POA: Diagnosis not present

## 2020-09-25 DIAGNOSIS — H11153 Pinguecula, bilateral: Secondary | ICD-10-CM | POA: Diagnosis not present

## 2020-10-13 ENCOUNTER — Other Ambulatory Visit: Payer: Self-pay | Admitting: Family Medicine

## 2020-10-13 DIAGNOSIS — M5442 Lumbago with sciatica, left side: Secondary | ICD-10-CM | POA: Diagnosis not present

## 2020-10-13 DIAGNOSIS — M9901 Segmental and somatic dysfunction of cervical region: Secondary | ICD-10-CM | POA: Diagnosis not present

## 2020-10-13 DIAGNOSIS — M530 Cervicocranial syndrome: Secondary | ICD-10-CM | POA: Diagnosis not present

## 2020-10-13 DIAGNOSIS — M9903 Segmental and somatic dysfunction of lumbar region: Secondary | ICD-10-CM | POA: Diagnosis not present

## 2020-10-15 DIAGNOSIS — M530 Cervicocranial syndrome: Secondary | ICD-10-CM | POA: Diagnosis not present

## 2020-10-15 DIAGNOSIS — M9903 Segmental and somatic dysfunction of lumbar region: Secondary | ICD-10-CM | POA: Diagnosis not present

## 2020-10-15 DIAGNOSIS — M5442 Lumbago with sciatica, left side: Secondary | ICD-10-CM | POA: Diagnosis not present

## 2020-10-15 DIAGNOSIS — M9901 Segmental and somatic dysfunction of cervical region: Secondary | ICD-10-CM | POA: Diagnosis not present

## 2020-10-16 ENCOUNTER — Ambulatory Visit (INDEPENDENT_AMBULATORY_CARE_PROVIDER_SITE_OTHER): Payer: Medicare HMO | Admitting: Family Medicine

## 2020-10-16 ENCOUNTER — Other Ambulatory Visit: Payer: Self-pay

## 2020-10-16 ENCOUNTER — Encounter: Payer: Self-pay | Admitting: Family Medicine

## 2020-10-16 VITALS — BP 156/61 | HR 53 | Temp 97.7°F

## 2020-10-16 DIAGNOSIS — E782 Mixed hyperlipidemia: Secondary | ICD-10-CM | POA: Diagnosis not present

## 2020-10-16 DIAGNOSIS — E538 Deficiency of other specified B group vitamins: Secondary | ICD-10-CM

## 2020-10-16 DIAGNOSIS — R739 Hyperglycemia, unspecified: Secondary | ICD-10-CM | POA: Diagnosis not present

## 2020-10-16 DIAGNOSIS — I10 Essential (primary) hypertension: Secondary | ICD-10-CM

## 2020-10-16 DIAGNOSIS — E038 Other specified hypothyroidism: Secondary | ICD-10-CM | POA: Diagnosis not present

## 2020-10-16 DIAGNOSIS — E559 Vitamin D deficiency, unspecified: Secondary | ICD-10-CM | POA: Diagnosis not present

## 2020-10-16 DIAGNOSIS — G3184 Mild cognitive impairment, so stated: Secondary | ICD-10-CM | POA: Diagnosis not present

## 2020-10-16 NOTE — Assessment & Plan Note (Signed)
Supplement and monitor 

## 2020-10-16 NOTE — Assessment & Plan Note (Signed)
hgba1c acceptable, minimize simple carbs. Increase exercise as tolerated.  

## 2020-10-16 NOTE — Assessment & Plan Note (Signed)
Well controlled, no changes to meds. Encouraged heart healthy diet such as the DASH diet and exercise as tolerated.  °

## 2020-10-16 NOTE — Patient Instructions (Signed)
Add Benefiber to the MIralax each day Arthritis Arthritis is a term that is commonly used to refer to joint pain or joint disease. There are more than 100 types of arthritis. What are the causes? The most common cause of this condition is wear and tear of a joint. Other causes include:  Gout.  Inflammation of a joint.  An infection of a joint.  Sprains and other injuries near the joint.  A reaction to medicines or drugs, or an allergic reaction. In some cases, the cause may not be known. What are the signs or symptoms? The main symptom of this condition is pain in the joint during movement. Other symptoms include:  Redness, swelling, or stiffness at a joint.  Warmth coming from the joint.  Fever.  Overall feeling of illness. How is this diagnosed? This condition may be diagnosed with a physical exam and tests, including:  Blood tests.  Urine tests.  Imaging tests, such as X-rays, an MRI, or a CT scan. Sometimes, fluid is removed from a joint for testing. How is this treated? This condition may be treated with:  Treatment of the cause, if it is known.  Rest.  Raising (elevating) the joint.  Applying cold or hot packs to the joint.  Medicines to improve symptoms and reduce inflammation.  Injections of a steroid such as cortisone into the joint to help reduce pain and inflammation. Depending on the cause of your arthritis, you may need to make lifestyle changes to reduce stress on your joint. Changes may include:  Exercising more.  Losing weight. Follow these instructions at home: Medicines  Take over-the-counter and prescription medicines only as told by your health care provider.  Do not take aspirin to relieve pain if your health care provider thinks that gout may be causing your pain. Activity  Rest your joint if told by your health care provider. Rest is important when your disease is active and your joint feels painful, swollen, or stiff.  Avoid  activities that make the pain worse. It is important to balance activity with rest.  Exercise your joint regularly with range-of-motion exercises as told by your health care provider. Try doing low-impact exercise, such as: ? Swimming. ? Water aerobics. ? Biking. ? Walking. Managing pain, stiffness, and swelling      If directed, put ice on the joint. ? Put ice in a plastic bag. ? Place a towel between your skin and the bag. ? Leave the ice on for 20 minutes, 2-3 times per day.  If your joint is swollen, raise (elevate) it above the level of your heart if directed by your health care provider.  If your joint feels stiff in the morning, try taking a warm shower.  If directed, apply heat to the affected area as often as told by your health care provider. Use the heat source that your health care provider recommends, such as a moist heat pack or a heating pad. If you have diabetes, do not apply heat without permission from your health care provider. To apply heat: ? Place a towel between your skin and the heat source. ? Leave the heat on for 20-30 minutes. ? Remove the heat if your skin turns bright red. This is especially important if you are unable to feel pain, heat, or cold. You may have a greater risk of getting burned. General instructions  Do not use any products that contain nicotine or tobacco, such as cigarettes, e-cigarettes, and chewing tobacco. If you need help quitting,  ask your health care provider.  Keep all follow-up visits as told by your health care provider. This is important. Contact a health care provider if:  The pain gets worse.  You have a fever. Get help right away if:  You develop severe joint pain, swelling, or redness.  Many joints become painful and swollen.  You develop severe back pain.  You develop severe weakness in your leg.  You cannot control your bladder or bowels. Summary  Arthritis is a term that is commonly used to refer to joint  pain or joint disease. There are more than 100 types of arthritis.  The most common cause of this condition is wear and tear of a joint. Other causes include gout, inflammation or infection of the joint, sprains, or allergies.  Symptoms of this condition include redness, swelling, or stiffness of the joint. Other symptoms include warmth, fever, or feeling ill.  This condition is treated with rest, elevation, medicines, and applying cold or hot packs.  Follow your health care provider's instructions about medicines, activity, exercises, and other home care treatments. This information is not intended to replace advice given to you by your health care provider. Make sure you discuss any questions you have with your health care provider. Document Revised: 11/06/2018 Document Reviewed: 11/06/2018 Elsevier Patient Education  2020 [employer].

## 2020-10-16 NOTE — Assessment & Plan Note (Signed)
Follows with endocrinology, stable on Levothyroxine.

## 2020-10-16 NOTE — Assessment & Plan Note (Signed)
Encouraged heart healthy diet, increase exercise, avoid trans fats, consider a krill oil cap daily, tolerating Rosuvastatin 

## 2020-10-17 ENCOUNTER — Telehealth: Payer: Self-pay

## 2020-10-17 LAB — COMPREHENSIVE METABOLIC PANEL
AG Ratio: 2 (calc) (ref 1.0–2.5)
ALT: 16 U/L (ref 6–29)
AST: 19 U/L (ref 10–35)
Albumin: 4.1 g/dL (ref 3.6–5.1)
Alkaline phosphatase (APISO): 76 U/L (ref 37–153)
BUN: 17 mg/dL (ref 7–25)
CO2: 25 mmol/L (ref 20–32)
Calcium: 8.8 mg/dL (ref 8.6–10.4)
Chloride: 98 mmol/L (ref 98–110)
Creat: 0.63 mg/dL (ref 0.60–0.93)
Globulin: 2.1 g/dL (calc) (ref 1.9–3.7)
Glucose, Bld: 99 mg/dL (ref 65–99)
Potassium: 3.9 mmol/L (ref 3.5–5.3)
Sodium: 136 mmol/L (ref 135–146)
Total Bilirubin: 0.4 mg/dL (ref 0.2–1.2)
Total Protein: 6.2 g/dL (ref 6.1–8.1)

## 2020-10-17 LAB — CBC
HCT: 38.5 % (ref 35.0–45.0)
Hemoglobin: 13 g/dL (ref 11.7–15.5)
MCH: 30 pg (ref 27.0–33.0)
MCHC: 33.8 g/dL (ref 32.0–36.0)
MCV: 88.7 fL (ref 80.0–100.0)
MPV: 10.1 fL (ref 7.5–12.5)
Platelets: 270 10*3/uL (ref 140–400)
RBC: 4.34 10*6/uL (ref 3.80–5.10)
RDW: 12.6 % (ref 11.0–15.0)
WBC: 7.8 10*3/uL (ref 3.8–10.8)

## 2020-10-17 LAB — LIPID PANEL
Cholesterol: 135 mg/dL (ref <200)
HDL: 52 mg/dL (ref >=50)
LDL Cholesterol (Calc): 68 mg/dL (calc)
Non-HDL Cholesterol (Calc): 83 mg/dL (calc) (ref <130)
Total CHOL/HDL Ratio: 2.6 (calc) (ref <5.0)
Triglycerides: 74 mg/dL (ref <150)

## 2020-10-17 LAB — TSH: TSH: 0.24 mIU/L — ABNORMAL LOW (ref 0.40–4.50)

## 2020-10-17 LAB — VITAMIN B12: Vitamin B-12: 617 pg/mL (ref 200–1100)

## 2020-10-17 LAB — HEMOGLOBIN A1C
Hgb A1c MFr Bld: 5.8 % of total Hgb — ABNORMAL HIGH (ref <5.7)
Mean Plasma Glucose: 120 (calc)
eAG (mmol/L): 6.6 (calc)

## 2020-10-17 LAB — VITAMIN D 25 HYDROXY (VIT D DEFICIENCY, FRACTURES): Vit D, 25-Hydroxy: 50 ng/mL (ref 30–100)

## 2020-10-17 NOTE — Telephone Encounter (Signed)
Attempted to call pt with lab result and LVM to call back.

## 2020-10-17 NOTE — Telephone Encounter (Signed)
-----   Message from Mosie Lukes, MD sent at 10/17/2020 11:51 AM EDT ----- Notify labs look good except TSH mildly suppressed which means her thyroid might be slightly over treated but since she is asymptomatic will just keep med the same and recheck labs in 3 months.

## 2020-10-18 NOTE — Progress Notes (Signed)
Subjective:    Patient ID: Jackie Stevens, female    DOB: 06-25-1946, 74 y.o.   MRN: [id number]  Chief Complaint  Patient presents with  . Follow-up    HPI Patient is in today for follow up on chronic medical concerns. No recent febrile illness or hospitalizations. She is accompanied by her husband today. She is struggling with her memory still but it is stable. Her greatest concern is ongoing pain and paresthesias in her left hip and leg. Is following with orthopaedics and she is a known candidate for TKR but has not decided whether to proceed or not. Denies CP/palp/SOB/HA/congestion/fevers/GI or GU c/o. Taking meds as prescribed  Past Medical History:  Diagnosis Date  . Abnormal cervical cytology 10/25/2012   Follows with Dr Leavy Cella of Gyn  . Allergic rhinitis 11/04/2010   Qualifier: Diagnosis of  By: Annamaria Boots MD, Clinton D   . Anemia 10/06/2013  . Anginal pain    pt has history of CP states had cardiac workup with no specific issues identified   . Arthritis    knees; right thumb; shoulders  . 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  Loss of glenohumeral joint space.  Well-maintained subacromial space.  Injected 09/30/2016 [name] .  Marland Kitchen Asthma, mild intermittent 04/02/2013  . Chicken pox as a child  . Colonic diverticular abscess 11/21/2012  . Constipation   . Decreased hearing   . Dermatitis 03/06/2017  . Diverticulitis 10/25/2012   pt. reports that a drain was placed - 09/2012    . Diverticulitis of rectosigmoid 11/28/2012  . Dry mouth   . Essential hypertension 11/08/2010   Qualifier: Diagnosis of  By: Annamaria Boots MD, Clinton D   . Excessive thirst   . External hemorrhoid, bleeding    "sometimes" (2013-01-11)  . Frequent urination   . GERD (gastroesophageal reflux disease)   . H/O hiatal hernia   . Hand tingling   . Heart murmur   .  Hyperglycemia 01/17/2020  . Hyperlipidemia   . Hypothyroidism   . Incontinence   . Insomnia   . Kidney stones 1970's   "passed on their own" (11-Jan-2013)  . Low back pain 06/09/2014  . Measles as a child  . 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 Obesity 09/11/2017  . Obstructive sleep apnea 11/04/2010   NPSG [facility] 07/15/10- AHI 13.5/ hr CPAP 10/APS    . Otitis media 01/07/2014  . Palpitations   . Paresthesia 03/23/2015   Left face  . PONV (postoperative nausea and vomiting)   . Rheumatoid arteritis   . Shortness of breath dyspnea    using stairs  . Sinus pain   . Swelling of both lower extremities   . Thyroid cancer 1980's  . Tinnitus   . UTI (urinary tract infection) 04/02/2013  . Vertigo 08/05/2019  . Vitamin D deficiency 02/28/2018    Past Surgical History:  Procedure Laterality Date  . CHOLECYSTECTOMY  1990  . COLON SURGERY    . COLOSTOMY REVISION  01/11/2013   Procedure: COLON RESECTION SIGMOID;  Surgeon: Gwenyth Ober, MD;  Location: [facility];  Service: General;  Laterality: N/A;  . CYSTOSCOPY WITH STENT PLACEMENT  Jan 11, 2013   Procedure: CYSTOSCOPY WITH STENT PLACEMENT;  Surgeon: Hanley Ben, MD;  Location: [facility];  Service: Urology;  Laterality: N/A;  . DILATION AND CURETTAGE OF UTERUS  1960's   "lots of them; had  miscarriages" (12/19/2012)  . LYSIS OF ADHESION N/A 12/02/2015   Procedure: LAPAROSCOPIC LYSIS OF ADHESION;  Surgeon: Johnathan Hausen, MD;  Location: [facility];  Service: General;  Laterality: N/A;  . ROBOTIC ASSISTED BILATERAL SALPINGO OOPHERECTOMY Bilateral 12/02/2015   Procedure: XI ROBOTIC ASSISTED BILATERAL SALPINGO OOPHORECTOMY;  Surgeon: Everitt Amber, MD;  Location: [facility];  Service: Gynecology;  Laterality: Bilateral;  . SIGMOID RESECTION / RECTOPEXY  12/19/2012  . THYROIDECTOMY, PARTIAL  1988   "then did iodine to remove the rest" (12/19/2012)  . TONSILLECTOMY  1951?  . TRANSRECTAL DRAINAGE OF PELVIC ABSCESS  10/27/2012  . VAGINAL HYSTERECTOMY  1970's   "still have  my ovaries" (12/19/2012)    Family History  Problem Relation Age of Onset  . Heart disease Father   . Pneumonia Father   . Hypertension Father   . Hyperlipidemia Father   . Cancer Father        skin  . Stroke Father   . Alzheimer's disease Mother   . Heart disease Mother   . Depression Mother   . Emphysema Brother        marijuana and cigarettes  . Alcohol abuse Brother   . Hearing loss Brother   . Diabetes Maternal Grandmother   . Alzheimer's disease Paternal Grandmother   . Cancer Paternal Grandmother        lung?- smoker  . Hyperlipidemia Paternal Grandmother   . Heart attack Paternal Grandfather   . Alcohol abuse Paternal Grandfather   . Neurofibromatosis Son        schwanomatosis  . Neurofibromatosis Son        swanomatosis  . Cancer Paternal Aunt     Social History   Socioeconomic History  . Marital status: Married    Spouse name: [name]  . Number of children: 2  . Years of education: 22  . Highest education level: High school graduate  Occupational History  . Occupation: Retired  Tobacco Use  . Smoking status: Never Smoker  . Smokeless tobacco: Never Used  Vaping Use  . Vaping Use: Never used  Substance and Sexual Activity  . Alcohol use: No    Alcohol/week: 0.0 standard drinks  . Drug use: No  . Sexual activity: Not Currently  Other Topics Concern  . Not on file  Social History Narrative   Married    Children   Right handed   Some college   Social Determinants of Health   Financial Resource Strain: Low Risk   . Difficulty of Paying Living Expenses: Not hard at all  Food Insecurity: No Food Insecurity  . Worried About Charity fundraiser in the Last Year: Never true  . Ran Out of Food in the Last Year: Never true  Transportation Needs: No Transportation Needs  . Lack of Transportation (Medical): No  . Lack of Transportation (Non-Medical): No  Physical Activity:   . Days of Exercise per Week: Not on file  . Minutes of Exercise per Session:  Not on file  Stress:   . Feeling of Stress : Not on file  Social Connections:   . Frequency of Communication with Friends and Family: Not on file  . Frequency of Social Gatherings with Friends and Family: Not on file  . Attends Religious Services: Not on file  . Active Member of Clubs or Organizations: Not on file  . Attends Archivist Meetings: Not on file  . Marital Status: Not on file  Intimate Partner Violence:   . Fear of  Current or Ex-Partner: Not on file  . Emotionally Abused: Not on file  . Physically Abused: Not on file  . Sexually Abused: Not on file    Outpatient Medications Prior to Visit  Medication Sig Dispense Refill  . azelastine (ASTELIN) 0.1 % nasal spray Place 2 sprays into both nostrils 2 (two) times daily. 30 mL 12  . donepezil (ARICEPT) 10 MG tablet Take 1/2 tablet daily for 2 weeks, then increase to 1 tablet daily 30 tablet 11  . famotidine (PEPCID) 20 MG tablet TAKE 2 TABLETS BY MOUTH EVERY DAY 180 tablet 1  . fluticasone (FLONASE) 50 MCG/ACT nasal spray Allergy Relief (fluticasone) 50 mcg/actuation nasal spray,suspension  Spray 1 spray every day by intranasal route.    . hydrochlorothiazide (HYDRODIURIL) 25 MG tablet TAKE 1 TABLET BY MOUTH EVERY DAY 90 tablet 0  . KETOCONAZOLE, TOPICAL, 1 % SHAM Apply 1 Dose topically once a week. 200 mL 1  . levothyroxine (SYNTHROID, LEVOTHROID) 150 MCG tablet Take 150 mcg by mouth daily before breakfast.    . losartan (COZAAR) 100 MG tablet TAKE 1 TABLET BY MOUTH EVERYDAY AT BEDTIME 90 tablet 1  . metoprolol tartrate (LOPRESSOR) 100 MG tablet TAKE 1&1/2 TABLETS BY MOUTH TWICE A DAY 270 tablet 1  . Multiple Vitamins-Minerals (MULTIVITAMIN WOMEN 50+ PO) Take 1 tablet by mouth daily.    . naproxen (NAPROSYN) 375 MG tablet TAKE 1 TABLET BY MOUTH 2 (TWO) TIMES DAILY WITH A MEAL. 180 tablet 1  . nitroGLYCERIN (NITROSTAT) 0.4 MG SL tablet PLACE 1 TABLET UNDER THE TONGUE EVERY 5 MINUTES AS NEEDED FOR CHEST PAIN 25 tablet 1    . NONFORMULARY OR COMPOUNDED ITEM Antifungal solution: Terbinafine 3%, Fluconazole 2%, Tea Tree Oil 5%, Urea 10%, Ibuprofen 2% in DMSO suspension #62mL 1 each 3  . omeprazole (PRILOSEC) 40 MG capsule TAKE 1 CAPSULE BY MOUTH TWICE A DAY 180 capsule 1  . prednisoLONE acetate (PRED FORTE) 1 % ophthalmic suspension PLACE 4 DROPS IN BOTH EARS TWICE DAILY AS NEEDED FOR ITCHING    . rosuvastatin (CRESTOR) 40 MG tablet TAKE 1 TABLET BY MOUTH EVERY DAY 90 tablet 1  . Vitamin D, Cholecalciferol, 1000 UNITS CAPS Take 1 capsule by mouth daily.    . meclizine (ANTIVERT) 25 MG tablet Take 1 tablet (25 mg total) by mouth 3 (three) times daily as needed for dizziness. 30 tablet 1   No facility-administered medications prior to visit.    Allergies  Allergen Reactions  . Neomycin-Bacitracin Zn-Polymyx Rash    Polysporin- is tolerated   . Niacin Other (See Comments) and Cough    "cough til I threw up" (12/19/2012)  . Ciprofloxacin Hives    Got cipro and flagyl at same time, localized hives to IV arm  . Flagyl [Metronidazole] Hives    Got cipro and flagyl at same time, localized hives to IV arm    Review of Systems  Constitutional: Negative for fever and malaise/fatigue.  HENT: Negative for congestion.   Eyes: Negative for blurred vision.  Respiratory: Negative for shortness of breath.   Cardiovascular: Negative for chest pain, palpitations and leg swelling.  Gastrointestinal: Negative for abdominal pain, blood in stool and nausea.  Genitourinary: Negative for dysuria and frequency.  Musculoskeletal: Positive for joint pain and myalgias. Negative for falls.  Skin: Negative for rash.  Neurological: Positive for tingling. Negative for dizziness, loss of consciousness and headaches.  Endo/Heme/Allergies: Negative for environmental allergies.  Psychiatric/Behavioral: Positive for memory loss. Negative for depression. The patient is  not nervous/anxious.        Objective:    Physical Exam Vitals and  nursing note reviewed.  Constitutional:      General: She is not in acute distress.    Appearance: She is well-developed.  HENT:     Head: Normocephalic and atraumatic.     Nose: Nose normal.  Eyes:     General:        Right eye: No discharge.        Left eye: No discharge.  Cardiovascular:     Rate and Rhythm: Normal rate and regular rhythm.     Heart sounds: No murmur heard.   Pulmonary:     Effort: Pulmonary effort is normal.     Breath sounds: Normal breath sounds.  Abdominal:     General: Bowel sounds are normal.     Palpations: Abdomen is soft.     Tenderness: There is no abdominal tenderness.  Musculoskeletal:     Cervical back: Normal range of motion and neck supple.  Skin:    General: Skin is warm and dry.  Neurological:     Mental Status: She is alert and oriented to person, place, and time.     BP (!) 156/61 (BP Location: Right Arm)   Pulse (!) 53   Temp 97.7 F (36.5 C)   SpO2 99%  Wt Readings from Last 3 Encounters:  06/24/20 208 lb (94.3 kg)  06/04/20 209 lb (94.8 kg)  06/02/20 208 lb (94.3 kg)    Diabetic Foot Exam - Simple   No data filed     Lab Results  Component Value Date   WBC 7.8 10/16/2020   HGB 13.0 10/16/2020   HCT 38.5 10/16/2020   PLT 270 10/16/2020   GLUCOSE 99 10/16/2020   CHOL 135 10/16/2020   TRIG 74 10/16/2020   HDL 52 10/16/2020   LDLDIRECT 81.0 02/12/2020   LDLCALC 68 10/16/2020   ALT 16 10/16/2020   AST 19 10/16/2020   NA 136 10/16/2020   K 3.9 10/16/2020   CL 98 10/16/2020   CREATININE 0.63 10/16/2020   BUN 17 10/16/2020   CO2 25 10/16/2020   TSH 0.24 (L) 10/16/2020   INR 0.99 03/16/2015   HGBA1C 5.8 (H) 10/16/2020    Lab Results  Component Value Date   TSH 0.24 (L) 10/16/2020   Lab Results  Component Value Date   WBC 7.8 10/16/2020   HGB 13.0 10/16/2020   HCT 38.5 10/16/2020   MCV 88.7 10/16/2020   PLT 270 10/16/2020   Lab Results  Component Value Date   NA 136 10/16/2020   K 3.9 10/16/2020    CO2 25 10/16/2020   GLUCOSE 99 10/16/2020   BUN 17 10/16/2020   CREATININE 0.63 10/16/2020   BILITOT 0.4 10/16/2020   ALKPHOS 74 02/12/2020   AST 19 10/16/2020   ALT 16 10/16/2020   PROT 6.2 10/16/2020   ALBUMIN 3.9 02/12/2020   CALCIUM 8.8 10/16/2020   ANIONGAP 12 07/17/2019   GFR 77.61 02/12/2020   Lab Results  Component Value Date   CHOL 135 10/16/2020   Lab Results  Component Value Date   HDL 52 10/16/2020   Lab Results  Component Value Date   LDLCALC 68 10/16/2020   Lab Results  Component Value Date   TRIG 74 10/16/2020   Lab Results  Component Value Date   CHOLHDL 2.6 10/16/2020   Lab Results  Component Value Date   HGBA1C 5.8 (H) 10/16/2020  Assessment & Plan:   Problem List Items Addressed This Visit    Hyperlipidemia, mixed - Primary    Encouraged heart healthy diet, increase exercise, avoid trans fats, consider a krill oil cap daily, tolerating Rosuvastatin      Relevant Orders   Lipid panel (Completed)   Essential hypertension    Well controlled, no changes to meds. Encouraged heart healthy diet such as the DASH diet and exercise as tolerated.       Relevant Orders   CBC (Completed)   Comprehensive metabolic panel (Completed)   TSH (Completed)   Hypothyroidism    Follows with endocrinology, stable on Levothyroxine.       Vitamin B12 deficiency    Supplement and monitor      Relevant Orders   Vitamin B12 (Completed)   Vitamin D deficiency    Supplement and monitor      Relevant Orders   VITAMIN D 25 Hydroxy (Vit-D Deficiency, Fractures) (Completed)   Hyperglycemia    hgba1c acceptable, minimize simple carbs. Increase exercise as tolerated.       Relevant Orders   Hemoglobin A1c (Completed)   Mild neurocognitive disorder    She is following with neurology and is tolerating Aricept         I have discontinued Saddie P. Koenigsberg's meclizine. I am also having her maintain her Vitamin D (Cholecalciferol), Multiple  Vitamins-Minerals (MULTIVITAMIN WOMEN 50+ PO), KETOCONAZOLE (TOPICAL), levothyroxine, nitroGLYCERIN, prednisoLONE acetate, NONFORMULARY OR COMPOUNDED ITEM, fluticasone, rosuvastatin, azelastine, omeprazole, donepezil, famotidine, naproxen, metoprolol tartrate, hydrochlorothiazide, and losartan.  No orders of the defined types were placed in this encounter.    Penni Homans, MD

## 2020-10-18 NOTE — Assessment & Plan Note (Signed)
She is following with neurology and is tolerating Aricept

## 2020-10-20 ENCOUNTER — Ambulatory Visit: Payer: Medicare HMO | Attending: Internal Medicine

## 2020-10-20 ENCOUNTER — Other Ambulatory Visit (HOSPITAL_BASED_OUTPATIENT_CLINIC_OR_DEPARTMENT_OTHER): Payer: Self-pay | Admitting: Internal Medicine

## 2020-10-20 DIAGNOSIS — M9901 Segmental and somatic dysfunction of cervical region: Secondary | ICD-10-CM | POA: Diagnosis not present

## 2020-10-20 DIAGNOSIS — M530 Cervicocranial syndrome: Secondary | ICD-10-CM | POA: Diagnosis not present

## 2020-10-20 DIAGNOSIS — M5442 Lumbago with sciatica, left side: Secondary | ICD-10-CM | POA: Diagnosis not present

## 2020-10-20 DIAGNOSIS — Z23 Encounter for immunization: Secondary | ICD-10-CM

## 2020-10-20 DIAGNOSIS — G4733 Obstructive sleep apnea (adult) (pediatric): Secondary | ICD-10-CM | POA: Diagnosis not present

## 2020-10-20 DIAGNOSIS — M9903 Segmental and somatic dysfunction of lumbar region: Secondary | ICD-10-CM | POA: Diagnosis not present

## 2020-10-20 NOTE — Progress Notes (Signed)
   Covid-19 Vaccination [facility]  Name:  Jackie Stevens    MRN: [id number] DOB: June 26, 1946  10/20/2020  [name] was observed post Covid-19 immunization for 15 minutes without incident. She was provided with Vaccine Information Sheet and instruction to access the V-Safe system.   [name] was instructed to call 911 with any severe reactions post vaccine: Marland Kitchen Difficulty breathing  . Swelling of face and throat  . A fast heartbeat  . A bad rash all over body  . Dizziness and weakness

## 2020-10-22 DIAGNOSIS — M9901 Segmental and somatic dysfunction of cervical region: Secondary | ICD-10-CM | POA: Diagnosis not present

## 2020-10-22 DIAGNOSIS — M5442 Lumbago with sciatica, left side: Secondary | ICD-10-CM | POA: Diagnosis not present

## 2020-10-22 DIAGNOSIS — M9903 Segmental and somatic dysfunction of lumbar region: Secondary | ICD-10-CM | POA: Diagnosis not present

## 2020-10-22 DIAGNOSIS — M530 Cervicocranial syndrome: Secondary | ICD-10-CM | POA: Diagnosis not present

## 2020-10-24 ENCOUNTER — Encounter: Payer: Self-pay | Admitting: Podiatry

## 2020-10-24 ENCOUNTER — Other Ambulatory Visit: Payer: Self-pay

## 2020-10-24 ENCOUNTER — Ambulatory Visit: Payer: Medicare HMO | Admitting: Podiatry

## 2020-10-24 DIAGNOSIS — M79676 Pain in unspecified toe(s): Secondary | ICD-10-CM

## 2020-10-24 DIAGNOSIS — M2042 Other hammer toe(s) (acquired), left foot: Secondary | ICD-10-CM

## 2020-10-24 DIAGNOSIS — Q828 Other specified congenital malformations of skin: Secondary | ICD-10-CM | POA: Diagnosis not present

## 2020-10-24 DIAGNOSIS — M2011 Hallux valgus (acquired), right foot: Secondary | ICD-10-CM

## 2020-10-24 DIAGNOSIS — B351 Tinea unguium: Secondary | ICD-10-CM

## 2020-10-24 DIAGNOSIS — L84 Corns and callosities: Secondary | ICD-10-CM | POA: Diagnosis not present

## 2020-10-24 DIAGNOSIS — M2041 Other hammer toe(s) (acquired), right foot: Secondary | ICD-10-CM

## 2020-10-24 DIAGNOSIS — M2012 Hallux valgus (acquired), left foot: Secondary | ICD-10-CM

## 2020-10-24 MED FILL — MODERNA COVID-19 VACCINE 10: 100 | 1 days supply | Qty: 0 | Fill #0

## 2020-10-24 NOTE — Progress Notes (Signed)
Subjective: Jackie Stevens is a pleasant 74 y.o. female patient seen today painful corn(s) b/l feet and painful mycotic toenails b/l that are difficult to trim. Pain interferes with ambulation. Aggravating factors include wearing enclosed shoe gear. Pain is relieved with periodic professional debridement.   She states her left 3rd digit is most painful on today's visit. She would like to know if anything else can be done for it. She wears her toe cap on occasion.   Patient Active Problem List   Diagnosis Date Noted  . 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 Hyperglycemia 01/17/2020  . Vertigo 08/05/2019  . Palpitations 05/28/2019  . Other fatigue 02/28/2018  . Shortness of breath on exertion 02/28/2018  . Vitamin D deficiency 02/28/2018  . Obesity 09/11/2017  . Dermatitis 03/06/2017  . Arthritis of left shoulder region 09/30/2016  . Pain of joint of left ankle and foot 09/15/2016  . Left shoulder pain 09/14/2016  . Preventative health care 02/26/2016  . Radicular pain of sacrum 02/26/2016  . Vitamin B12 deficiency 02/26/2016  . Ventral hernia 12/02/2015  . Pelvic mass in female 12/02/2015  . Colitis 09/14/2015  . Paresthesia 03/23/2015  . Otitis, externa, infective 03/13/2015  . Hyponatremia 03/13/2015  . Cystitis 09/10/2014  . Headache 08/21/2014  . Low back pain 06/09/2014  . Otitis media 01/07/2014  . Anemia 10/06/2013  . Medicare annual wellness visit, subsequent 10/06/2013  . Asthma, mild intermittent 04/02/2013  . Internal hemorrhoid, bleeding 01/23/2013  . Diverticulitis of rectosigmoid 11/28/2012  . Colonic diverticular abscess 11/21/2012  . Abnormal cervical cytology 10/25/2012  . Cancer ([place])   . Insomnia   . Hypothyroidism   . Constipation   . Cough 09/02/2012  . Chest pain 08/05/2011  . Hyperlipidemia, mixed 11/08/2010  . Essential hypertension 11/08/2010  . GERD (gastroesophageal reflux disease) 11/08/2010  . Obstructive sleep apnea 11/04/2010  .  Allergic rhinitis 11/04/2010    Current Outpatient Medications on File Prior to Visit  Medication Sig Dispense Refill  . azelastine (ASTELIN) 0.1 % nasal spray Place 2 sprays into both nostrils 2 (two) times daily. 30 mL 12  . donepezil (ARICEPT) 10 MG tablet Take 1/2 tablet daily for 2 weeks, then increase to 1 tablet daily 30 tablet 11  . famotidine (PEPCID) 20 MG tablet TAKE 2 TABLETS BY MOUTH EVERY DAY 180 tablet 1  . fluticasone (FLONASE) 50 MCG/ACT nasal spray Allergy Relief (fluticasone) 50 mcg/actuation nasal spray,suspension  Spray 1 spray every day by intranasal route.    . hydrochlorothiazide (HYDRODIURIL) 25 MG tablet TAKE 1 TABLET BY MOUTH EVERY DAY 90 tablet 0  . KETOCONAZOLE, TOPICAL, 1 % SHAM Apply 1 Dose topically once a week. 200 mL 1  . levothyroxine (SYNTHROID, LEVOTHROID) 150 MCG tablet Take 150 mcg by mouth daily before breakfast.    . losartan (COZAAR) 100 MG tablet TAKE 1 TABLET BY MOUTH EVERYDAY AT BEDTIME 90 tablet 1  . metoprolol tartrate (LOPRESSOR) 100 MG tablet TAKE 1&1/2 TABLETS BY MOUTH TWICE A DAY 270 tablet 1  . Multiple Vitamins-Minerals (MULTIVITAMIN WOMEN 50+ PO) Take 1 tablet by mouth daily.    . naproxen (NAPROSYN) 375 MG tablet TAKE 1 TABLET BY MOUTH 2 (TWO) TIMES DAILY WITH A MEAL. 180 tablet 1  . nitroGLYCERIN (NITROSTAT) 0.4 MG SL tablet PLACE 1 TABLET UNDER THE TONGUE EVERY 5 MINUTES AS NEEDED FOR CHEST PAIN 25 tablet 1  . NONFORMULARY OR COMPOUNDED ITEM Antifungal solution: Terbinafine 3%, Fluconazole 2%, Tea Tree Oil 5%, Urea 10%, Ibuprofen 2%  in DMSO suspension #24mL 1 each 3  . omeprazole (PRILOSEC) 40 MG capsule TAKE 1 CAPSULE BY MOUTH TWICE A DAY 180 capsule 1  . prednisoLONE acetate (PRED FORTE) 1 % ophthalmic suspension PLACE 4 DROPS IN BOTH EARS TWICE DAILY AS NEEDED FOR ITCHING    . rosuvastatin (CRESTOR) 40 MG tablet TAKE 1 TABLET BY MOUTH EVERY DAY 90 tablet 1  . Vitamin D, Cholecalciferol, 1000 UNITS CAPS Take 1 capsule by mouth daily.      No current facility-administered medications on file prior to visit.    Allergies  Allergen Reactions  . Neomycin-Bacitracin Zn-Polymyx Rash    Polysporin- is tolerated   . Niacin Other (See Comments) and Cough    "cough til I threw up" (12/19/2012)  . Ciprofloxacin Hives    Got cipro and flagyl at same time, localized hives to IV arm  . Flagyl [Metronidazole] Hives    Got cipro and flagyl at same time, localized hives to IV arm    Objective: Physical Exam  General: Jackie Stevens is a pleasant 74 y.o. Caucasian female, in NAD. AAO x 3.   Vascular:  Capillary fill time to digits <3 seconds b/l lower extremities. Palpable DP pulses b/l. Palpable PT pulses b/l. Pedal hair present b/l. Skin temperature gradient within normal limits b/l. No pain with calf compression b/l.  Dermatological:  Pedal skin with normal turgor, texture and tone bilaterally. No open wounds bilaterally. No interdigital macerations bilaterally. Toenails 1-5 b/l elongated, discolored, dystrophic, thickened, crumbly with subungual debris and tenderness to dorsal palpation. Hyperkeratotic lesion(s) L 3rd toe and R 2nd toe.  No erythema, no edema, no drainage, no flocculence. Porokeratotic lesion(s) submet head 2 right foot and submet head 4 right foot. No erythema, no edema, no drainage, no flocculence.   Left 3rd digit has subungual hemorrhage and there is tenderness to palpation. No erythema, no edema, no drainage, no fluctuance.  Musculoskeletal:  Normal muscle strength 5/5 to all lower extremity muscle groups bilaterally. No pain crepitus or joint limitation noted with ROM b/l. Hallux valgus with bunion deformity noted b/l lower extremities. Hammertoes noted to the 2-5 bilaterally.  Neurological:  Protective sensation intact 5/5 intact bilaterally with 10g monofilament b/l. Vibratory sensation intact b/l.  Assessment and Plan:  1. Pain due to onychomycosis of toenail   2. Porokeratosis   3. Pre-ulcerative  corn or callous   4. Hallux valgus, acquired, bilateral   5. Acquired hammertoes of both feet    -Examined patient. -Toenails 1-5 b/l were debrided in length and girth with sterile nail nippers and dremel without iatrogenic bleeding.  -Corn(s) L 3rd toe and R 2nd toe pared utilizing sterile scalpel blade without complication or incident. Total number debrided=2. -Painful porokeratotic lesion(s) submet head 2 right foot and submet head 4 right foot pared and enucleated with sterile scalpel blade without incident. -Patient to report any pedal injuries to medical professional immediately. -Patient referred for Dr.Adam McDonald for surgical consultation regarding left 3rd digit preulcerative corn.  Return in about 9 weeks (around 12/26/2020) for nail trim, painful corn(s)/callus(es).  Marzetta Board, DPM

## 2020-10-27 DIAGNOSIS — M5442 Lumbago with sciatica, left side: Secondary | ICD-10-CM | POA: Diagnosis not present

## 2020-10-27 DIAGNOSIS — M9903 Segmental and somatic dysfunction of lumbar region: Secondary | ICD-10-CM | POA: Diagnosis not present

## 2020-10-27 DIAGNOSIS — M9901 Segmental and somatic dysfunction of cervical region: Secondary | ICD-10-CM | POA: Diagnosis not present

## 2020-10-27 DIAGNOSIS — M530 Cervicocranial syndrome: Secondary | ICD-10-CM | POA: Diagnosis not present

## 2020-10-28 DIAGNOSIS — M25552 Pain in left hip: Secondary | ICD-10-CM | POA: Diagnosis not present

## 2020-10-29 DIAGNOSIS — M5442 Lumbago with sciatica, left side: Secondary | ICD-10-CM | POA: Diagnosis not present

## 2020-10-29 DIAGNOSIS — M530 Cervicocranial syndrome: Secondary | ICD-10-CM | POA: Diagnosis not present

## 2020-10-29 DIAGNOSIS — M9901 Segmental and somatic dysfunction of cervical region: Secondary | ICD-10-CM | POA: Diagnosis not present

## 2020-10-29 DIAGNOSIS — M9903 Segmental and somatic dysfunction of lumbar region: Secondary | ICD-10-CM | POA: Diagnosis not present

## 2020-10-29 DIAGNOSIS — G4733 Obstructive sleep apnea (adult) (pediatric): Secondary | ICD-10-CM | POA: Diagnosis not present

## 2020-11-03 ENCOUNTER — Ambulatory Visit: Payer: Medicare HMO | Admitting: Podiatry

## 2020-11-03 DIAGNOSIS — M5442 Lumbago with sciatica, left side: Secondary | ICD-10-CM | POA: Diagnosis not present

## 2020-11-03 DIAGNOSIS — M9903 Segmental and somatic dysfunction of lumbar region: Secondary | ICD-10-CM | POA: Diagnosis not present

## 2020-11-03 DIAGNOSIS — M9901 Segmental and somatic dysfunction of cervical region: Secondary | ICD-10-CM | POA: Diagnosis not present

## 2020-11-03 DIAGNOSIS — M530 Cervicocranial syndrome: Secondary | ICD-10-CM | POA: Diagnosis not present

## 2020-11-05 DIAGNOSIS — M530 Cervicocranial syndrome: Secondary | ICD-10-CM | POA: Diagnosis not present

## 2020-11-05 DIAGNOSIS — M9903 Segmental and somatic dysfunction of lumbar region: Secondary | ICD-10-CM | POA: Diagnosis not present

## 2020-11-05 DIAGNOSIS — M9901 Segmental and somatic dysfunction of cervical region: Secondary | ICD-10-CM | POA: Diagnosis not present

## 2020-11-05 DIAGNOSIS — M5442 Lumbago with sciatica, left side: Secondary | ICD-10-CM | POA: Diagnosis not present

## 2020-11-10 DIAGNOSIS — M530 Cervicocranial syndrome: Secondary | ICD-10-CM | POA: Diagnosis not present

## 2020-11-10 DIAGNOSIS — M9903 Segmental and somatic dysfunction of lumbar region: Secondary | ICD-10-CM | POA: Diagnosis not present

## 2020-11-10 DIAGNOSIS — M5442 Lumbago with sciatica, left side: Secondary | ICD-10-CM | POA: Diagnosis not present

## 2020-11-10 DIAGNOSIS — M9901 Segmental and somatic dysfunction of cervical region: Secondary | ICD-10-CM | POA: Diagnosis not present

## 2020-11-11 DIAGNOSIS — M530 Cervicocranial syndrome: Secondary | ICD-10-CM | POA: Diagnosis not present

## 2020-11-11 DIAGNOSIS — M5442 Lumbago with sciatica, left side: Secondary | ICD-10-CM | POA: Diagnosis not present

## 2020-11-11 DIAGNOSIS — M9901 Segmental and somatic dysfunction of cervical region: Secondary | ICD-10-CM | POA: Diagnosis not present

## 2020-11-11 DIAGNOSIS — M9903 Segmental and somatic dysfunction of lumbar region: Secondary | ICD-10-CM | POA: Diagnosis not present

## 2020-11-17 ENCOUNTER — Ambulatory Visit: Payer: Medicare HMO | Admitting: Podiatry

## 2020-11-17 ENCOUNTER — Encounter: Payer: Self-pay | Admitting: Podiatry

## 2020-11-17 ENCOUNTER — Other Ambulatory Visit: Payer: Self-pay

## 2020-11-17 DIAGNOSIS — M79674 Pain in right toe(s): Secondary | ICD-10-CM

## 2020-11-17 DIAGNOSIS — L84 Corns and callosities: Secondary | ICD-10-CM | POA: Diagnosis not present

## 2020-11-17 DIAGNOSIS — M5442 Lumbago with sciatica, left side: Secondary | ICD-10-CM | POA: Diagnosis not present

## 2020-11-17 DIAGNOSIS — M79675 Pain in left toe(s): Secondary | ICD-10-CM | POA: Diagnosis not present

## 2020-11-17 DIAGNOSIS — M2041 Other hammer toe(s) (acquired), right foot: Secondary | ICD-10-CM

## 2020-11-17 DIAGNOSIS — M2042 Other hammer toe(s) (acquired), left foot: Secondary | ICD-10-CM

## 2020-11-17 DIAGNOSIS — M9901 Segmental and somatic dysfunction of cervical region: Secondary | ICD-10-CM | POA: Diagnosis not present

## 2020-11-17 DIAGNOSIS — M9903 Segmental and somatic dysfunction of lumbar region: Secondary | ICD-10-CM | POA: Diagnosis not present

## 2020-11-17 DIAGNOSIS — M530 Cervicocranial syndrome: Secondary | ICD-10-CM | POA: Diagnosis not present

## 2020-11-19 DIAGNOSIS — G4733 Obstructive sleep apnea (adult) (pediatric): Secondary | ICD-10-CM | POA: Diagnosis not present

## 2020-11-19 DIAGNOSIS — M5442 Lumbago with sciatica, left side: Secondary | ICD-10-CM | POA: Diagnosis not present

## 2020-11-19 DIAGNOSIS — M9903 Segmental and somatic dysfunction of lumbar region: Secondary | ICD-10-CM | POA: Diagnosis not present

## 2020-11-19 DIAGNOSIS — M9901 Segmental and somatic dysfunction of cervical region: Secondary | ICD-10-CM | POA: Diagnosis not present

## 2020-11-19 DIAGNOSIS — M530 Cervicocranial syndrome: Secondary | ICD-10-CM | POA: Diagnosis not present

## 2020-11-19 NOTE — Progress Notes (Signed)
  Subjective:  Patient ID: Jackie Stevens, female    DOB: 30-May-1946,  MRN: [id number]  Chief Complaint  Patient presents with  . Callouses    surgical consult 3rd left toe    74 y.o. female presents with the above complaint. History confirmed with patient.  She is referred to me by Dr. Elisha Ponder for surgical consultation for a flexor tenotomy of the left third toe.  Objective:  Physical Exam: warm, good capillary refill, no trophic changes or ulcerative lesions, normal DP and PT pulses and normal sensory exam. Left Foot: Contracted hammertoes that are semireducible and has a distal tip callus of the left third toe   Assessment:   1. Pre-ulcerative corn or callous   2. Pain in toes of both feet   3. Acquired hammertoes of both feet      Plan:  Patient was evaluated and treated and all questions answered.  Hammertoe left, 3rd toe -Discussed proceeding with flexor tenotomy procedure. Patient agrees to proceed. -Consent form reviewed and signed. -Proceed with flexor tenotomy as below  Procedure: Flexor Tenotomy Indication for Procedure: toe with semi-reducible hammertoe with distal tip ulceration. Flexor tenotomy indicated to alleviate contracture, reduce pressure, and enhance healing of the ulceration. Location:  Anesthesia: Lidocaine 1% plain; 1.5 mL and Marcaine 0.5% plain; 1.5 mL digital block Instrumentation: 18 g needle  Technique: The toe was anesthetized as above and prepped in the usual fashion. The toe was exsanquinated and a tourniquet was secured at the base of the toe. An 18g needle was then used to percutaneously release the flexor tendon at the plantar surface of the toe with noted release of the hammertoe deformity. The incision was then dressed with antibiotic ointment and band-aid. Compression splint dressing applied. Patient tolerated the procedure well. Dressing: Dry, sterile, compression dressing. Disposition: Patient tolerated procedure well.   No follow-ups  on file.

## 2020-11-20 DIAGNOSIS — M1712 Unilateral primary osteoarthritis, left knee: Secondary | ICD-10-CM | POA: Diagnosis not present

## 2020-11-20 DIAGNOSIS — M25552 Pain in left hip: Secondary | ICD-10-CM | POA: Diagnosis not present

## 2020-11-21 DIAGNOSIS — M1712 Unilateral primary osteoarthritis, left knee: Secondary | ICD-10-CM | POA: Insufficient documentation

## 2020-11-24 ENCOUNTER — Telehealth: Payer: Self-pay

## 2020-11-24 DIAGNOSIS — M9901 Segmental and somatic dysfunction of cervical region: Secondary | ICD-10-CM | POA: Diagnosis not present

## 2020-11-24 DIAGNOSIS — M530 Cervicocranial syndrome: Secondary | ICD-10-CM | POA: Diagnosis not present

## 2020-11-24 DIAGNOSIS — M5442 Lumbago with sciatica, left side: Secondary | ICD-10-CM | POA: Diagnosis not present

## 2020-11-24 DIAGNOSIS — M9903 Segmental and somatic dysfunction of lumbar region: Secondary | ICD-10-CM | POA: Diagnosis not present

## 2020-11-24 NOTE — Telephone Encounter (Signed)
   [facility] Pre-operative Risk Assessment    HEARTCARE STAFF: - Please ensure there is not already an duplicate clearance open for this procedure. - Under Visit Info/Reason for Call, type in Other and utilize the format Clearance MM/DD/YY or Clearance TBD. Do not use dashes or single digits. - If request is for dental extraction, please clarify the # of teeth to be extracted.  Request for surgical clearance:  1. What type of surgery is being performed? Left total knee arthroplasty   2. When is this surgery scheduled? TBD   3. What type of clearance is required (medical clearance vs. Pharmacy clearance to hold med vs. Both)? Medical  4. Are there any medications that need to be held prior to surgery and how long? No   5. Practice name and name of physician performing surgery? [facility] Larey Dresser, MD   6. What is the office phone number? [phone]   7.   What is the office fax number? [phone]  8.   Anesthesia type (None, local, MAC, general) ? Choice   Mady Haagensen 11/24/2020, 12:27 PM  _________________________________________________________________   (provider comments below)

## 2020-11-25 NOTE — Telephone Encounter (Signed)
   Primary Cardiologist: Jenkins Rouge, MD  Chart reviewed as part of pre-operative protocol coverage. Patient was contacted 11/25/2020 in reference to pre-operative risk assessment for pending surgery as outlined below.  Jackie Stevens was last seen on 06/04/20 by Dr. Johnsie Cancel.  Since that day, Jackie Stevens has done well. She has a history of atypical chest pain and has had two negative nuclear stress tests, most recent in Jan 2020. Despite knee pain, she is able to complete 4.0 METS without angina (climbs stairs, moderate house work).  Therefore, based on ACC/AHA guidelines, the patient would be at acceptable risk for the planned procedure without further cardiovascular testing.   The patient was advised that if she develops new symptoms prior to surgery to contact our office to arrange for a follow-up visit, and she verbalized understanding.  I will route this recommendation to the requesting party via Epic fax function and remove from pre-op pool. Please call with questions.  Tami Lin Kalia Vahey, PA 11/25/2020, 9:21 AM

## 2020-11-26 DIAGNOSIS — M9901 Segmental and somatic dysfunction of cervical region: Secondary | ICD-10-CM | POA: Diagnosis not present

## 2020-11-26 DIAGNOSIS — M5442 Lumbago with sciatica, left side: Secondary | ICD-10-CM | POA: Diagnosis not present

## 2020-11-26 DIAGNOSIS — M530 Cervicocranial syndrome: Secondary | ICD-10-CM | POA: Diagnosis not present

## 2020-11-26 DIAGNOSIS — M9903 Segmental and somatic dysfunction of lumbar region: Secondary | ICD-10-CM | POA: Diagnosis not present

## 2020-11-26 NOTE — Progress Notes (Signed)
Cardiology Office Note   Date:  12/04/2020   ID:  Menaal, Russum 1946/04/12, MRN [id number]  PCP:  Mosie Lukes, MD  Cardiologist:   Jenkins Rouge, MD   No chief complaint on file.     History of Present Illness:  74 y.o. with history of atypical chest pain. Also history of HTN, HLD, GERD Normal myovue 2012 and January 2020 with normal EF. TTE done 2019 for dyspnea also normal Atypical pain again June 2021 with normal ECG during pain CXR with atelectasis   Has had COVID vaccine  She needs a right TKR with Dr Marquis Buggy to be scheduled for 01/19/21  Clear to have this  Some dementia No benefit with Aricept needs to f/u with primary and dementia clinic to ?  Change to [place]     Past Medical History:  Diagnosis Date  . Abnormal cervical cytology 10/25/2012   Follows with Dr Leavy Cella of Gyn  . Allergic rhinitis 11/04/2010   Qualifier: Diagnosis of  By: Annamaria Boots MD, Clinton D   . Anemia 10/06/2013  . Anginal pain    pt has history of CP states had cardiac workup with no specific issues identified   . Arthritis    knees; right thumb; shoulders  . 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  Loss of glenohumeral joint space.  Well-maintained subacromial space.  Injected 09/30/2016 [name] .  Marland Kitchen Asthma, mild intermittent 04/02/2013  . Chicken pox as a child  . Colonic diverticular abscess 11/21/2012  . Constipation   . Decreased hearing   . Dermatitis 03/06/2017  . Diverticulitis 10/25/2012   pt. reports that a drain was placed - 09/2012    . Diverticulitis of rectosigmoid 11/28/2012  . Dry mouth   . Essential hypertension 11/08/2010   Qualifier: Diagnosis of  By: Annamaria Boots MD, Clinton D   . Excessive thirst   . External hemorrhoid, bleeding    "sometimes" (01-13-13)  . Frequent urination   . GERD (gastroesophageal reflux  disease)   . H/O hiatal hernia   . Hand tingling   . Heart murmur   . Hyperglycemia 01/17/2020  . Hyperlipidemia   . Hypothyroidism   . Incontinence   . Insomnia   . Kidney stones 1970's   "passed on their own" (01-13-2013)  . Low back pain 06/09/2014  . Measles as a child  . 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 Obesity 09/11/2017  . Obstructive sleep apnea 11/04/2010   NPSG [facility] 07/15/10- AHI 13.5/ hr CPAP 10/APS    . Otitis media 01/07/2014  . Palpitations   . Paresthesia 03/23/2015   Left face  . PONV (postoperative nausea and vomiting)   . Rheumatoid arteritis   . Shortness of breath dyspnea    using stairs  . Sinus pain   . Swelling of both lower extremities   . Thyroid cancer 1980's  . Tinnitus   . UTI (urinary tract infection) 04/02/2013  . Vertigo 08/05/2019  . Vitamin D deficiency 02/28/2018    Past Surgical History:  Procedure Laterality Date  . CHOLECYSTECTOMY  1990  . COLON SURGERY    . COLOSTOMY REVISION  13-Jan-2013   Procedure: COLON RESECTION SIGMOID;  Surgeon: Gwenyth Ober, MD;  Location: [facility];  Service: General;  Laterality: N/A;  . CYSTOSCOPY WITH STENT PLACEMENT  Jan 13, 2013   Procedure: CYSTOSCOPY WITH STENT PLACEMENT;  Surgeon: Hanley Ben, MD;  Location: [facility];  Service: Urology;  Laterality: N/A;  . DILATION AND CURETTAGE OF UTERUS  1960's   "lots of them; had miscarriages" (12/19/2012)  . LYSIS OF ADHESION N/A 12/02/2015   Procedure: LAPAROSCOPIC LYSIS OF ADHESION;  Surgeon: Johnathan Hausen, MD;  Location: [facility];  Service: General;  Laterality: N/A;  . ROBOTIC ASSISTED BILATERAL SALPINGO OOPHERECTOMY Bilateral 12/02/2015   Procedure: XI ROBOTIC ASSISTED BILATERAL SALPINGO OOPHORECTOMY;  Surgeon: Everitt Amber, MD;  Location: [facility];  Service: Gynecology;  Laterality: Bilateral;  . SIGMOID RESECTION / RECTOPEXY  12/19/2012  . THYROIDECTOMY, PARTIAL  1988   "then did iodine to remove the rest" (12/19/2012)  . TONSILLECTOMY  1951?  . TRANSRECTAL DRAINAGE OF  PELVIC ABSCESS  10/27/2012  . VAGINAL HYSTERECTOMY  1970's   "still have my ovaries" (12/19/2012)     Current Outpatient Medications  Medication Sig Dispense Refill  . azelastine (ASTELIN) 0.1 % nasal spray Place 2 sprays into both nostrils 2 (two) times daily. 30 mL 12  . donepezil (ARICEPT) 10 MG tablet Take 1/2 tablet daily for 2 weeks, then increase to 1 tablet daily 30 tablet 11  . famotidine (PEPCID) 20 MG tablet TAKE 2 TABLETS BY MOUTH EVERY DAY 180 tablet 1  . fluticasone (FLONASE) 50 MCG/ACT nasal spray Allergy Relief (fluticasone) 50 mcg/actuation nasal spray,suspension  Spray 1 spray every day by intranasal route.    . hydrochlorothiazide (HYDRODIURIL) 25 MG tablet TAKE 1 TABLET BY MOUTH EVERY DAY 90 tablet 0  . KETOCONAZOLE, TOPICAL, 1 % SHAM Apply 1 Dose topically once a week. 200 mL 1  . levothyroxine (SYNTHROID, LEVOTHROID) 150 MCG tablet Take 150 mcg by mouth daily before breakfast.    . losartan (COZAAR) 100 MG tablet TAKE 1 TABLET BY MOUTH EVERYDAY AT BEDTIME 90 tablet 1  . metoprolol tartrate (LOPRESSOR) 100 MG tablet TAKE 1&1/2 TABLETS BY MOUTH TWICE A DAY 270 tablet 1  . Multiple Vitamins-Minerals (MULTIVITAMIN WOMEN 50+ PO) Take 1 tablet by mouth daily.    . naproxen (NAPROSYN) 375 MG tablet TAKE 1 TABLET BY MOUTH 2 (TWO) TIMES DAILY WITH A MEAL. 180 tablet 1  . nitroGLYCERIN (NITROSTAT) 0.4 MG SL tablet PLACE 1 TABLET UNDER THE TONGUE EVERY 5 MINUTES AS NEEDED FOR CHEST PAIN 25 tablet 1  . NONFORMULARY OR COMPOUNDED ITEM Antifungal solution: Terbinafine 3%, Fluconazole 2%, Tea Tree Oil 5%, Urea 10%, Ibuprofen 2% in DMSO suspension #38mL 1 each 3  . omeprazole (PRILOSEC) 40 MG capsule TAKE 1 CAPSULE BY MOUTH TWICE A DAY 180 capsule 1  . prednisoLONE acetate (PRED FORTE) 1 % ophthalmic suspension PLACE 4 DROPS IN BOTH EARS TWICE DAILY AS NEEDED FOR ITCHING    . rosuvastatin (CRESTOR) 40 MG tablet TAKE 1 TABLET BY MOUTH EVERY DAY 90 tablet 1  . Vitamin D, Cholecalciferol,  1000 UNITS CAPS Take 1 capsule by mouth daily.     No current facility-administered medications for this visit.    Allergies:   Neomycin-bacitracin zn-polymyx, Niacin, Ciprofloxacin, and Flagyl [metronidazole]    Social History:  The patient  reports that she has never smoked. She has never used smokeless tobacco. She reports that she does not drink alcohol and does not use drugs.   Family History:  The patient's family history includes Alcohol abuse in her brother and paternal grandfather; Alzheimer's disease in her mother and paternal grandmother; Cancer in her father, paternal aunt, and paternal grandmother; Depression in her mother; Diabetes in her maternal grandmother; Emphysema  in her brother; Hearing loss in her brother; Heart attack in her paternal grandfather; Heart disease in her father and mother; Hyperlipidemia in her father and paternal grandmother; Hypertension in her father; Neurofibromatosis in her son and son; Pneumonia in her father; Stroke in her father.    ROS:  Please see the history of present illness.   Otherwise, review of systems are positive for none.   All other systems are reviewed and negative.    PHYSICAL EXAM: VS:  Ht 5\' 1"  (1.549 m)   BMI 39.30 kg/m  , BMI Body mass index is 39.3 kg/m. Affect appropriate Healthy:  appears stated age 10: normal Neck supple with no adenopathy JVP normal no bruits no thyromegaly Lungs clear with no wheezing and good diaphragmatic motion Heart:  S1/S2 no murmur, no rub, gallop or click PMI normal Abdomen: benighn, BS positve, no tenderness, no AAA no bruit.  No HSM or HJR Distal pulses intact with no bruits No edema Neuro non-focal Skin warm and dry No muscular weakness    EKG:  10/27/18 SR LAD poor R wave progression 06/02/20 SB rate 52 normal    Recent Labs: 10/16/2020: ALT 16; BUN 17; Creat 0.63; Hemoglobin 13.0; Platelets 270; Potassium 3.9; Sodium 136; TSH 0.24    Lipid Panel    Component Value  Date/Time   CHOL 135 10/16/2020 1527   TRIG 74 10/16/2020 1527   HDL 52 10/16/2020 1527   CHOLHDL 2.6 10/16/2020 1527   VLDL 15.6 05/16/2020 0747   LDLCALC 68 10/16/2020 1527   LDLDIRECT 81.0 02/12/2020 1028      Wt Readings from Last 3 Encounters:  06/24/20 94.3 kg  06/04/20 94.8 kg  06/02/20 94.3 kg      Other studies Reviewed: Additional studies/ records that were reviewed today include: Notes from ER November CXR, labs, ECG, Myovue 2012 TTE March 2019 Primary care notes .    ASSESSMENT AND PLAN:  1.  Chest Pain: atypical history of normal myovue in 2012 Myovue 12/21/18 normal no ischemia EF 67% observe  2. HTN:  Well controlled.  Continue current medications and low sodium Dash type diet.   3. HLD:  On statin labs with primary  4. Thyroid:  On replacement normal TSH  5. Bruit:  Bilateral   Duplex 12/20/18 plaque no stenosis f/u duplex January 2022  6. Preoperative:  Clear to have right TKR with Dr Maureen Ralphs   Current medicines are reviewed at length with the patient today.  The patient does not have concerns regarding medicines.  The following changes have been made:  no change  Labs/ tests ordered today include:  None   No orders of the defined types were placed in this encounter.    Disposition:   FU with me in a year     Signed, Jenkins Rouge, MD  12/04/2020 1:43 PM    [facility] [address], [place], [place]  [address] Phone: [phone]; Fax: [phone]

## 2020-11-27 ENCOUNTER — Telehealth: Payer: Self-pay

## 2020-11-27 NOTE — Telephone Encounter (Signed)
Surgical clearance received from [facility]- Pt needing L TKA w/ Dr. Wynelle Link- cleared by Dr. Charlett Blake- however she is also requesting cardiac clearance (see telephone note from 11/24/2020 at cardio office). Form faxed to ATTN: Claiborne Billings at [phone]. Form sent for scanning.

## 2020-12-01 DIAGNOSIS — G4733 Obstructive sleep apnea (adult) (pediatric): Secondary | ICD-10-CM | POA: Diagnosis not present

## 2020-12-03 ENCOUNTER — Other Ambulatory Visit: Payer: Self-pay | Admitting: Family Medicine

## 2020-12-04 ENCOUNTER — Other Ambulatory Visit: Payer: Self-pay

## 2020-12-04 ENCOUNTER — Encounter: Payer: Self-pay | Admitting: Cardiovascular Disease

## 2020-12-04 ENCOUNTER — Ambulatory Visit: Payer: Medicare HMO | Admitting: Cardiovascular Disease

## 2020-12-04 VITALS — BP 150/62 | HR 55 | Ht 61.0 in | Wt 201.8 lb

## 2020-12-04 DIAGNOSIS — Z01810 Encounter for preprocedural cardiovascular examination: Secondary | ICD-10-CM

## 2020-12-04 NOTE — Patient Instructions (Addendum)

## 2020-12-09 ENCOUNTER — Other Ambulatory Visit: Payer: Self-pay

## 2020-12-09 ENCOUNTER — Ambulatory Visit: Payer: Medicare HMO | Admitting: Podiatry

## 2020-12-09 DIAGNOSIS — M21611 Bunion of right foot: Secondary | ICD-10-CM

## 2020-12-09 DIAGNOSIS — M2041 Other hammer toe(s) (acquired), right foot: Secondary | ICD-10-CM

## 2020-12-09 DIAGNOSIS — M21612 Bunion of left foot: Secondary | ICD-10-CM

## 2020-12-09 DIAGNOSIS — M2011 Hallux valgus (acquired), right foot: Secondary | ICD-10-CM

## 2020-12-09 DIAGNOSIS — M2042 Other hammer toe(s) (acquired), left foot: Secondary | ICD-10-CM

## 2020-12-09 DIAGNOSIS — M2012 Hallux valgus (acquired), left foot: Secondary | ICD-10-CM

## 2020-12-09 NOTE — Progress Notes (Signed)
  Subjective:  Patient ID: Jackie Stevens, female    DOB: 06-Aug-1946,  MRN: [id number]  Chief Complaint  Patient presents with  . Ulcer    F/U Lt 3rd toe tenotomy -Pt states," doing alright -pt states it feels "funny" she can't mover the toe or anything -pt deneies swelling/N/V/F/Ch - w/ light redness - no pain or open sore Tx: abx ointment and bandiad    74 y.o. female returns with the above complaint. History confirmed with patient.  She is doing well.  No pain in the toe.  Objective:  Physical Exam: warm, good capillary refill, no trophic changes or ulcerative lesions, normal DP and PT pulses and normal sensory exam. Left Foot: Third toe contracture has reduced with good healing of the tenotomy site.  Callus appears improved.   Assessment:   No diagnosis found.   Plan:  Patient was evaluated and treated and all questions answered.  Hammertoe left, 3rd toe -Flexor tenotomy site healing well.  Her calluses improved significantly.  I debrided the hyperkeratosis around this.  She does have residual hammertoe contractures of the fourth and fifth toe and significant one of the second toe that is nearly a crossover toe.  In addition of this she has fairly significant hallux valgus.  We discussed further correction of these and all the deformities.  She has an upcoming knee replacement in March, I recommend we wait until she has rehabbed this completely before considering reconstructive forefoot surgery.  I had like her to return in 5 to 6 months following rehab of her knee to take new x-rays and discuss what her treatment options would be for the bunion and hammertoes.  Return in about 5 months (around 05/09/2021) for consult for bunion and hammertoe surgery, take x-rays.

## 2020-12-16 ENCOUNTER — Other Ambulatory Visit: Payer: Self-pay | Admitting: Family Medicine

## 2020-12-16 DIAGNOSIS — K219 Gastro-esophageal reflux disease without esophagitis: Secondary | ICD-10-CM

## 2020-12-16 DIAGNOSIS — R059 Cough, unspecified: Secondary | ICD-10-CM

## 2020-12-16 DIAGNOSIS — J302 Other seasonal allergic rhinitis: Secondary | ICD-10-CM

## 2020-12-20 DIAGNOSIS — G4733 Obstructive sleep apnea (adult) (pediatric): Secondary | ICD-10-CM | POA: Diagnosis not present

## 2020-12-24 DIAGNOSIS — G8929 Other chronic pain: Secondary | ICD-10-CM | POA: Diagnosis not present

## 2020-12-24 DIAGNOSIS — K219 Gastro-esophageal reflux disease without esophagitis: Secondary | ICD-10-CM | POA: Diagnosis not present

## 2020-12-24 DIAGNOSIS — Z008 Encounter for other general examination: Secondary | ICD-10-CM | POA: Diagnosis not present

## 2020-12-24 DIAGNOSIS — K5900 Constipation, unspecified: Secondary | ICD-10-CM | POA: Diagnosis not present

## 2020-12-24 DIAGNOSIS — G3184 Mild cognitive impairment, so stated: Secondary | ICD-10-CM | POA: Diagnosis not present

## 2020-12-24 DIAGNOSIS — E669 Obesity, unspecified: Secondary | ICD-10-CM | POA: Diagnosis not present

## 2020-12-24 DIAGNOSIS — J309 Allergic rhinitis, unspecified: Secondary | ICD-10-CM | POA: Diagnosis not present

## 2020-12-24 DIAGNOSIS — E039 Hypothyroidism, unspecified: Secondary | ICD-10-CM | POA: Diagnosis not present

## 2020-12-24 DIAGNOSIS — G4733 Obstructive sleep apnea (adult) (pediatric): Secondary | ICD-10-CM | POA: Diagnosis not present

## 2020-12-24 DIAGNOSIS — I10 Essential (primary) hypertension: Secondary | ICD-10-CM | POA: Diagnosis not present

## 2020-12-24 DIAGNOSIS — E785 Hyperlipidemia, unspecified: Secondary | ICD-10-CM | POA: Diagnosis not present

## 2021-01-06 ENCOUNTER — Other Ambulatory Visit: Payer: Self-pay | Admitting: Family Medicine

## 2021-01-06 DIAGNOSIS — M1712 Unilateral primary osteoarthritis, left knee: Secondary | ICD-10-CM | POA: Diagnosis not present

## 2021-01-06 DIAGNOSIS — M25662 Stiffness of left knee, not elsewhere classified: Secondary | ICD-10-CM | POA: Diagnosis not present

## 2021-01-06 DIAGNOSIS — M25562 Pain in left knee: Secondary | ICD-10-CM | POA: Diagnosis not present

## 2021-01-07 ENCOUNTER — Ambulatory Visit (INDEPENDENT_AMBULATORY_CARE_PROVIDER_SITE_OTHER): Payer: Medicare HMO | Admitting: Podiatry

## 2021-01-07 ENCOUNTER — Other Ambulatory Visit: Payer: Self-pay

## 2021-01-07 ENCOUNTER — Encounter: Payer: Self-pay | Admitting: Podiatry

## 2021-01-07 DIAGNOSIS — M79676 Pain in unspecified toe(s): Secondary | ICD-10-CM | POA: Diagnosis not present

## 2021-01-07 DIAGNOSIS — M2042 Other hammer toe(s) (acquired), left foot: Secondary | ICD-10-CM

## 2021-01-07 DIAGNOSIS — B351 Tinea unguium: Secondary | ICD-10-CM

## 2021-01-07 DIAGNOSIS — M2041 Other hammer toe(s) (acquired), right foot: Secondary | ICD-10-CM

## 2021-01-07 DIAGNOSIS — M79672 Pain in left foot: Secondary | ICD-10-CM

## 2021-01-07 DIAGNOSIS — M21612 Bunion of left foot: Secondary | ICD-10-CM

## 2021-01-07 DIAGNOSIS — M2012 Hallux valgus (acquired), left foot: Secondary | ICD-10-CM

## 2021-01-07 DIAGNOSIS — M79671 Pain in right foot: Secondary | ICD-10-CM

## 2021-01-07 DIAGNOSIS — L84 Corns and callosities: Secondary | ICD-10-CM

## 2021-01-09 NOTE — Patient Instructions (Addendum)
DUE TO COVID-19 ONLY ONE VISITOR IS ALLOWED TO COME WITH YOU AND STAY IN THE WAITING ROOM ONLY DURING PRE OP AND PROCEDURE DAY OF SURGERY. THE 1 VISITOR  MAY VISIT WITH YOU AFTER SURGERY IN YOUR PRIVATE ROOM DURING VISITING HOURS ONLY!  YOU NEED TO HAVE A COVID 19 TEST ON_2/3______ @_12 :30 pm______, THIS TEST MUST BE DONE BEFORE SURGERY,  COVID TESTING SITE [address] [place] [address], IT IS ON THE RIGHT GOING OUT [address] APPROXIMATELY  2 MINUTES PAST [employer] ON THE RIGHT. ONCE YOUR COVID TEST IS COMPLETED,  PLEASE BEGIN THE QUARANTINE INSTRUCTIONS AS OUTLINED IN YOUR HANDOUT.                Jackie Stevens    Your procedure is scheduled on: 01/19/21   Report to [facility] Main  Entrance   Report to admitting at 8:00 AM     Call this number if you have problems the morning of surgery [place], NO [place].   No food after midnight.    You may have clear liquid until 7:30 AM.    At 7:00 AM drink pre surgery drink  . Nothing by mouth after 7:30 AM.   Take these medicines the morning of surgery with A SIP OF WATER: Levothyroxine,  Metoprolol, your memory medication                                         Bring your mask and tubing to the hospital. Bring your inhaler with you.                                 You may not have any metal on your body including hair pins and              piercings  Do not wear jewelry, make-up, lotions, powders or perfumes, deodorant             Do not wear nail polish on your fingernails.  Do not shave  48 hours prior to surgery.                Do not bring valuables to the hospital. [place].  Contacts, dentures or bridgework may not be worn into surgery.      Patients discharged the day of surgery will not be allowed to drive home.   IF YOU ARE HAVING SURGERY AND  GOING HOME THE SAME DAY, YOU MUST HAVE AN ADULT TO DRIVE YOU HOME AND BE WITH YOU FOR 24 HOURS  YOU MAY GO HOME BY TAXI OR UBER OR ORTHERWISE, BUT AN ADULT MUST ACCOMPANY YOU HOME AND STAY WITH YOU FOR 24 HOURS.  Name and phone number of your driver:  Special Instructions: N/A              Please read over the following fact sheets you were given: _____________________________________________________________________             [facility] - Preparing for Surgery Before surgery, you can play an important role.   Because skin is not sterile, your skin  needs to be as free of germs as possible.   You can reduce the number of germs on your skin by washing with CHG (chlorahexidine gluconate) soap before surgery.   CHG is an antiseptic cleaner which kills germs and bonds with the skin to continue killing germs even after washing. Please DO NOT use if you have an allergy to CHG or antibacterial soaps.   If your skin becomes reddened/irritated stop using the CHG and inform your nurse when you arrive at Short Stay. Do not shave (including legs and underarms) for at least 48 hours prior to the first CHG shower.   Please follow these instructions carefully:  1.  Shower with CHG Soap the night before surgery and the  morning of Surgery.  2.  If you choose to wash your hair, wash your hair first as usual with your  normal  shampoo.  3.  After you shampoo, rinse your hair and body thoroughly to remove the  shampoo.                                        4.  Use CHG as you would any other liquid soap.  You can apply chg directly  to the skin and wash                       Gently with a scrungie or clean washcloth.  5.  Apply the CHG Soap to your body ONLY FROM THE NECK DOWN.   Do not use on face/ open                           Wound or open sores. Avoid contact with eyes, ears mouth and genitals (private parts).                       Wash face,  Genitals (private parts) with your normal soap.              6.  Wash thoroughly, paying special attention to the area where your surgery  will be performed.  7.  Thoroughly rinse your body with warm water from the neck down.  8.  DO NOT shower/wash with your normal soap after using and rinsing off  the CHG Soap.             9.  Pat yourself dry with a clean towel.            10.  Wear clean pajamas.            11.  Place clean sheets on your bed the night of your first shower and do not  sleep with pets. Day of Surgery : Do not apply any lotions/deodorants the morning of surgery.  Please wear clean clothes to the hospital/surgery center.  FAILURE TO FOLLOW THESE INSTRUCTIONS MAY RESULT IN THE CANCELLATION OF YOUR SURGERY PATIENT SIGNATURE_________________________________  NURSE SIGNATURE__________________________________  ________________________________________________________________________   Jackie Stevens  An incentive spirometer is a tool that can help keep your lungs clear and active. This tool measures how well you are filling your lungs with each breath. Taking long deep breaths may help reverse or decrease the chance of developing breathing (pulmonary) problems (especially infection) following:  A long period of time when you are unable to move or be active. BEFORE THE PROCEDURE  If the spirometer includes an indicator to show your best effort, your nurse or respiratory therapist will set it to a desired goal.  If possible, sit up straight or lean slightly forward. Try not to slouch.  Hold the incentive spirometer in an upright position. INSTRUCTIONS FOR USE  1. Sit on the edge of your bed if possible, or sit up as far as you can in bed or on a chair. 2. Hold the incentive spirometer in an upright position. 3. Breathe out normally. 4. Place the mouthpiece in your mouth and seal your lips tightly around it. 5. Breathe in slowly and as deeply as possible, raising the piston or the ball toward the top of the column. 6. Hold  your breath for 3-5 seconds or for as long as possible. Allow the piston or ball to fall to the bottom of the column. 7. Remove the mouthpiece from your mouth and breathe out normally. 8. Rest for a few seconds and repeat Steps 1 through 7 at least 10 times every 1-2 hours when you are awake. Take your time and take a few normal breaths between deep breaths. 9. The spirometer may include an indicator to show your best effort. Use the indicator as a goal to work toward during each repetition. 10. After each set of 10 deep breaths, practice coughing to be sure your lungs are clear. If you have an incision (the cut made at the time of surgery), support your incision when coughing by placing a pillow or rolled up towels firmly against it. Once you are able to get out of bed, walk around indoors and cough well. You may stop using the incentive spirometer when instructed by your caregiver.  RISKS AND COMPLICATIONS  Take your time so you do not get dizzy or light-headed.  If you are in pain, you may need to take or ask for pain medication before doing incentive spirometry. It is harder to take a deep breath if you are having pain. AFTER USE  Rest and breathe slowly and easily.  It can be helpful to keep track of a log of your progress. Your caregiver can provide you with a simple table to help with this. If you are using the spirometer at home, follow these instructions: [place] IF:   You are having difficultly using the spirometer.  You have trouble using the spirometer as often as instructed.  Your pain medication is not giving enough relief while using the spirometer.  You develop fever of 100.5 F (38.1 C) or higher. SEEK IMMEDIATE MEDICAL CARE IF:   You cough up bloody sputum that had not been present before.  You develop fever of 102 F (38.9 C) or greater.  You develop worsening pain at or near the incision site. MAKE SURE YOU:   Understand these instructions.  Will  watch your condition.  Will get help right away if you are not doing well or get worse. Document Released: 04/11/2007 Document Revised: 02/21/2012 Document Reviewed: 06/12/2007 [facility] Patient Information 2014 [place], [place].   ________________________________________________________________________

## 2021-01-11 NOTE — Progress Notes (Signed)
Subjective: Jackie Stevens is a pleasant 75 y.o. female patient seen today painful corn(s) b/l feet and painful mycotic toenails b/l that are difficult to trim. Pain interferes with ambulation. Aggravating factors include wearing enclosed shoe gear. Pain is relieved with periodic professional debridement.   She states her right great toe is tender on today's visit.  She did have flexor tenotomy performed of left 3rd digit and this has healed. She has upcoming knee surgery on January 19, 2021.  Patient Active Problem List   Diagnosis Date Noted  . Osteoarthritis of left knee 11/21/2020  . 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 Hyperglycemia 01/17/2020  . Vertigo 08/05/2019  . Palpitations 05/28/2019  . Other fatigue 02/28/2018  . Shortness of breath on exertion 02/28/2018  . Vitamin D deficiency 02/28/2018  . Obesity 09/11/2017  . Dermatitis 03/06/2017  . Arthritis of left shoulder region 09/30/2016  . Pain of joint of left ankle and foot 09/15/2016  . Left shoulder pain 09/14/2016  . Preventative health care 02/26/2016  . Radicular pain of sacrum 02/26/2016  . Vitamin B12 deficiency 02/26/2016  . Ventral hernia 12/02/2015  . Pelvic mass in female 12/02/2015  . Colitis 09/14/2015  . Paresthesia 03/23/2015  . Otitis, externa, infective 03/13/2015  . Hyponatremia 03/13/2015  . Cystitis 09/10/2014  . Headache 08/21/2014  . Low back pain 06/09/2014  . Otitis media 01/07/2014  . Anemia 10/06/2013  . Medicare annual wellness visit, subsequent 10/06/2013  . Asthma, mild intermittent 04/02/2013  . Internal hemorrhoid, bleeding 01/23/2013  . Diverticulitis of rectosigmoid 11/28/2012  . Colonic diverticular abscess 11/21/2012  . Abnormal cervical cytology 10/25/2012  . Cancer ([place])   . Insomnia   . Hypothyroidism   . Constipation   . Cough 09/02/2012  . Chest pain 08/05/2011  . Hyperlipidemia, mixed 11/08/2010  . Essential hypertension 11/08/2010  . GERD (gastroesophageal  reflux disease) 11/08/2010  . Obstructive sleep apnea 11/04/2010  . Allergic rhinitis 11/04/2010    Current Outpatient Medications on File Prior to Visit  Medication Sig Dispense Refill  . Apoaequorin (PREVAGEN EXTRA STRENGTH) 20 MG CAPS Take 20 mg by mouth daily.    Marland Kitchen azelastine (ASTELIN) 0.1 % nasal spray Place 2 sprays into both nostrils 2 (two) times daily. 30 mL 12  . carboxymethylcellulose (REFRESH PLUS) 0.5 % SOLN Place 1 drop into both eyes 3 (three) times daily as needed (dry eyes).    Marland Kitchen docusate sodium (COLACE) 100 MG capsule Take 100 mg by mouth daily.    Marland Kitchen donepezil (ARICEPT) 10 MG tablet Take 1/2 tablet daily for 2 weeks, then increase to 1 tablet daily (Patient not taking: Reported on 01/01/2021) 30 tablet 11  . famotidine (PEPCID) 20 MG tablet TAKE 2 TABLETS BY MOUTH EVERY DAY (Patient taking differently: Take 40 mg by mouth at bedtime.) 180 tablet 1  . Fish Oil-Krill Oil (MEGARED ADVANCED 4 IN 1 PO) Take 1 capsule by mouth daily.    . hydrochlorothiazide (HYDRODIURIL) 25 MG tablet Take 1 tablet (25 mg total) by mouth daily. 90 tablet 1  . KETOCONAZOLE, TOPICAL, 1 % SHAM Apply 1 Dose topically once a week. (Patient not taking: Reported on 01/01/2021) 200 mL 1  . Krill Oil 350 MG CAPS Take 350 mg by mouth every evening.    Marland Kitchen levothyroxine (SYNTHROID, LEVOTHROID) 150 MCG tablet Take 150 mcg by mouth daily before breakfast.    . losartan (COZAAR) 100 MG tablet TAKE 1 TABLET BY MOUTH EVERYDAY AT BEDTIME (Patient taking differently: Take 100 mg  by mouth at bedtime.) 90 tablet 1  . metoprolol tartrate (LOPRESSOR) 100 MG tablet TAKE 1&1/2 TABLETS BY MOUTH TWICE A DAY (Patient taking differently: Take 150 mg by mouth 2 (two) times daily.) 270 tablet 1  . Multiple Vitamins-Minerals (MULTIVITAMIN WOMEN 50+ PO) Take 1 tablet by mouth daily.    . naproxen (NAPROSYN) 375 MG tablet TAKE 1 TABLET BY MOUTH 2 (TWO) TIMES DAILY WITH A MEAL. (Patient taking differently: Take 375 mg by mouth 2 (two)  times daily with a meal.) 180 tablet 1  . nitroGLYCERIN (NITROSTAT) 0.4 MG SL tablet PLACE 1 TABLET UNDER THE TONGUE EVERY 5 MINUTES AS NEEDED FOR CHEST PAIN 25 tablet 1  . omeprazole (PRILOSEC) 40 MG capsule TAKE 1 CAPSULE BY MOUTH TWICE A DAY (Patient taking differently: Take 40 mg by mouth in the morning and at bedtime.) 180 capsule 1  . polyethylene glycol (MIRALAX / GLYCOLAX) 17 g packet Take 17 g by mouth daily.    . rosuvastatin (CRESTOR) 40 MG tablet TAKE 1 TABLET BY MOUTH EVERY DAY (Patient taking differently: Take 40 mg by mouth daily.) 90 tablet 1  . Turmeric 500 MG CAPS Take 500 mg by mouth daily.     No current facility-administered medications on file prior to visit.    Allergies  Allergen Reactions  . Neomycin-Bacitracin Zn-Polymyx Rash    Polysporin- is tolerated   . Niacin Other (See Comments) and Cough    "cough til I threw up" (12/19/2012)  . Ciprofloxacin Hives    Got cipro and flagyl at same time, localized hives to IV arm  . Flagyl [Metronidazole] Hives    Got cipro and flagyl at same time, localized hives to IV arm    Objective: Physical Exam  General: Jackie Stevens is a pleasant 75 y.o. Caucasian female, in NAD. AAO x 3.   Vascular:  Capillary fill time to digits <3 seconds b/l lower extremities. Palpable DP pulses b/l. Palpable PT pulses b/l. Pedal hair present b/l. Skin temperature gradient within normal limits b/l. No pain with calf compression b/l.  Dermatological:  Pedal skin with normal turgor, texture and tone bilaterally. No open wounds bilaterally. No interdigital macerations bilaterally. Toenails 1-5 b/l elongated, discolored, dystrophic, thickened, crumbly with subungual debris and tenderness to dorsal palpation. Hyperkeratotic lesion(s) R 2nd toe.  No erythema, no edema, no drainage, no fluctuance. Porokeratotic lesion(s) submet head 2 right foot and submet head 4 right foot. No erythema, no edema, no drainage, no fluctuance.   Nail groove callus  noted lateral border.  Musculoskeletal:  Normal muscle strength 5/5 to all lower extremity muscle groups bilaterally. No pain crepitus or joint limitation noted with ROM b/l. Hallux valgus with bunion deformity noted b/l lower extremities. Hammertoes noted to the 2-5 bilaterally.  Neurological:  Protective sensation intact 5/5 intact bilaterally with 10g monofilament b/l. Vibratory sensation intact b/l.  Assessment and Plan:  1. Pain due to onychomycosis of toenail   2. Corns and callosities   3. Hallux valgus with bunions, left   4. Acquired hammertoes of both feet    -Examined patient. -Toenails 1-5 b/l were debrided in length and girth with sterile nail nippers and dremel without iatrogenic bleeding.  -Corn(s) R 2nd toe and callus(es) R hallux were pared utilizing sterile scalpel blade without incident. Total number debrided =2. -Painful porokeratotic lesion(s) submet head 2 right foot and submet head 4 right foot pared and enucleated with sterile scalpel blade without incident. -Patient to report any pedal injuries to medical  professional immediately. -Patient/POA to call should there be question/concern in the interim.  Return in about 3 months (around 04/07/2021).  Marzetta Board, DPM

## 2021-01-12 ENCOUNTER — Other Ambulatory Visit: Payer: Self-pay

## 2021-01-12 ENCOUNTER — Encounter (HOSPITAL_COMMUNITY): Payer: Self-pay

## 2021-01-12 ENCOUNTER — Encounter (HOSPITAL_COMMUNITY)
Admission: RE | Admit: 2021-01-12 | Discharge: 2021-01-12 | Disposition: A | Discharge status: Home / Self Care | Payer: Medicare HMO | Source: Ambulatory Visit | Attending: Orthopedic Surgery | Admitting: Orthopedic Surgery

## 2021-01-12 DIAGNOSIS — Z79899 Other long term (current) drug therapy: Secondary | ICD-10-CM | POA: Diagnosis not present

## 2021-01-12 DIAGNOSIS — Z9049 Acquired absence of other specified parts of digestive tract: Secondary | ICD-10-CM | POA: Diagnosis not present

## 2021-01-12 DIAGNOSIS — K219 Gastro-esophageal reflux disease without esophagitis: Secondary | ICD-10-CM | POA: Diagnosis not present

## 2021-01-12 DIAGNOSIS — Z791 Long term (current) use of non-steroidal anti-inflammatories (NSAID): Secondary | ICD-10-CM | POA: Diagnosis not present

## 2021-01-12 DIAGNOSIS — Z01812 Encounter for preprocedural laboratory examination: Secondary | ICD-10-CM | POA: Diagnosis not present

## 2021-01-12 DIAGNOSIS — M1712 Unilateral primary osteoarthritis, left knee: Secondary | ICD-10-CM | POA: Insufficient documentation

## 2021-01-12 DIAGNOSIS — I10 Essential (primary) hypertension: Secondary | ICD-10-CM | POA: Diagnosis not present

## 2021-01-12 DIAGNOSIS — E039 Hypothyroidism, unspecified: Secondary | ICD-10-CM | POA: Diagnosis not present

## 2021-01-12 DIAGNOSIS — Z79890 Hormone replacement therapy (postmenopausal): Secondary | ICD-10-CM | POA: Diagnosis not present

## 2021-01-12 DIAGNOSIS — R0789 Other chest pain: Secondary | ICD-10-CM | POA: Insufficient documentation

## 2021-01-12 DIAGNOSIS — Z90722 Acquired absence of ovaries, bilateral: Secondary | ICD-10-CM | POA: Insufficient documentation

## 2021-01-12 DIAGNOSIS — Z960 Presence of urogenital implants: Secondary | ICD-10-CM | POA: Insufficient documentation

## 2021-01-12 DIAGNOSIS — G4733 Obstructive sleep apnea (adult) (pediatric): Secondary | ICD-10-CM | POA: Diagnosis not present

## 2021-01-12 DIAGNOSIS — Z8719 Personal history of other diseases of the digestive system: Secondary | ICD-10-CM | POA: Insufficient documentation

## 2021-01-12 HISTORY — DX: Personal history of urinary calculi: Z87.442

## 2021-01-12 LAB — COMPREHENSIVE METABOLIC PANEL
ALT: 23 U/L (ref 0–44)
AST: 23 U/L (ref 15–41)
Albumin: 4.1 g/dL (ref 3.5–5.0)
Alkaline Phosphatase: 82 U/L (ref 38–126)
Anion gap: 11 (ref 5–15)
BUN: 15 mg/dL (ref 8–23)
CO2: 28 mmol/L (ref 22–32)
Calcium: 9.1 mg/dL (ref 8.9–10.3)
Chloride: 97 mmol/L — ABNORMAL LOW (ref 98–111)
Creatinine, Ser: 0.74 mg/dL (ref 0.44–1.00)
GFR, Estimated: >60 mL/min (ref >60)
Glucose, Bld: 105 mg/dL — ABNORMAL HIGH (ref 70–99)
Potassium: 4.3 mmol/L (ref 3.5–5.1)
Sodium: 136 mmol/L (ref 135–145)
Total Bilirubin: 0.7 mg/dL (ref 0.3–1.2)
Total Protein: 6.6 g/dL (ref 6.5–8.1)

## 2021-01-12 LAB — PROTIME-INR
INR: 1 (ref 0.8–1.2)
Prothrombin Time: 12.6 seconds (ref 11.4–15.2)

## 2021-01-12 LAB — CBC
HCT: 38.7 % (ref 36.0–46.0)
Hemoglobin: 12.7 g/dL (ref 12.0–15.0)
MCH: 29.7 pg (ref 26.0–34.0)
MCHC: 32.8 g/dL (ref 30.0–36.0)
MCV: 90.4 fL (ref 80.0–100.0)
Platelets: 275 10*3/uL (ref 150–400)
RBC: 4.28 MIL/uL (ref 3.87–5.11)
RDW: 13 % (ref 11.5–15.5)
WBC: 7.9 10*3/uL (ref 4.0–10.5)
nRBC: 0 % (ref 0.0–0.2)

## 2021-01-12 LAB — SURGICAL PCR SCREEN
MRSA, PCR: NEGATIVE
Staphylococcus aureus: NEGATIVE

## 2021-01-12 LAB — APTT: aPTT: 31 seconds (ref 24–36)

## 2021-01-12 NOTE — Progress Notes (Signed)
COVID Vaccine Completed:yes Date COVID Vaccine completed:02/11/20-Booster 10/20/20 COVID vaccine manufacturer:   Moderna     PCP - Dr. Frederik Pear Cardiologist - Dr. Edmonia James  Chest x-ray - 06/03/20-epic EKG - 06/02/20-epic Stress Test - 12/21/18-epic ECHO - 03/10/18-epic Cardiac Cath - no Pacemaker/ICD device last checked:no  Sleep Study - yes CPAP - yes  Fasting Blood Sugar - NA Checks Blood Sugar _____ times a day  Blood Thinner Instructions:NA Aspirin Instructions: Last Dose:  Anesthesia review:   Patient denies shortness of breath, fever, cough and chest pain at PAT appointment yes  Patient verbalized understanding of instructions that were given to them at the PAT appointment. Patient was also instructed that they will need to review over the PAT instructions again at home before surgery.Yes Pt uses a walker and doesn't climb stairs. She reports no SOB doing house work or with ADLs.. She has some short term memory loss and takes Prevagen.

## 2021-01-13 ENCOUNTER — Encounter: Payer: Medicare HMO | Admitting: Psychology

## 2021-01-13 NOTE — Progress Notes (Signed)
Anesthesia Chart Review   Case: [id number] Date/Time: 01/19/21 1015   Procedure: TOTAL KNEE ARTHROPLASTY (Left Knee) - 35min   Anesthesia type: Choice   Pre-op diagnosis: Left knee osteoarthritis   Location: [facility] ROOM 10 / [facility]   Surgeons: Gaynelle Arabian, MD      DISCUSSION:75 y.o. never smoker with h/o PONV, GERD, HTN, hypothyroidism, OSA, left knee OA scheduled for above procedure 01/19/21 with Dr. Gaynelle Arabian.   Pt last seen by cardiology 12/04/2020. Stable at this visit.  Per Dr. Johnsie Cancel, "Preoperative:  Clear to have right TKR with Dr Maureen Ralphs"  Cardiology preoperative evaluation 11/25/2020, "Chart reviewed as part of pre-operative protocol coverage. Patient was contacted 11/25/2020 in reference to pre-operative risk assessment for pending surgery as outlined below.  SARH KIRSCHENBAUM was last seen on 06/04/20 by Dr. Johnsie Cancel.  Since that day, KITZIA CAMUS has done well. She has a history of atypical chest pain and has had two negative nuclear stress tests, most recent in Jan 2020. Despite knee pain, she is able to complete 4.0 METS without angina (climbs stairs, moderate house work). Therefore, based on ACC/AHA guidelines, the patient would be at acceptable risk for the planned procedure without further cardiovascular testing."  Anticipate pt can proceed with planned procedure barring acute status change.   VS: BP (!) 191/72   Pulse (!) 56   Temp 36.6 C (Oral)   Resp 20   Ht 5\' 2"  (1.575 m)   Wt 91.6 kg   SpO2 100%   BMI 36.95 kg/m   PROVIDERS: Mosie Lukes, MD is PCP   Jenkins Rouge, MD is Cardiologist  LABS: Labs reviewed: Acceptable for surgery. (all labs ordered are listed, but only abnormal results are displayed)  Labs Reviewed  COMPREHENSIVE METABOLIC PANEL - Abnormal; Notable for the following components:      Result Value   Chloride 97 (*)    Glucose, Bld 105 (*)    All other components within normal limits  SURGICAL PCR SCREEN  CBC  PROTIME-INR  APTT  TYPE AND  SCREEN     IMAGES:   EKG: 06/02/20 Rate 52 bpm Sinus bradycardia   CV: Myocardial Perfusion 12/21/2018  Nuclear stress EF: 67%. The left ventricular ejection fraction is normal (55-65%).  There was no ST segment deviation noted during stress.  The study is normal.  This is a low risk study. No evidence of ischemia or previous infarction.   Echo 03/10/2018 Study Conclusions   - Left ventricle: The cavity size was normal. Systolic function was  normal. The estimated ejection fraction was in the range of 60%  to 65%. Wall motion was normal; there were no regional wall  motion abnormalities. There was an increased relative  contribution of atrial contraction to ventricular filling.  Doppler parameters are consistent with abnormal left ventricular  relaxation (grade 1 diastolic dysfunction).  - Aorta: Ascending aortic diameter: 40 mm (S).  - Ascending aorta: The ascending aorta was mildly dilated.  Past Medical History:  Diagnosis Date  . Abnormal cervical cytology 10/25/2012   Follows with Dr Leavy Cella of Gyn  . Allergic rhinitis 11/04/2010   Qualifier: Diagnosis of  By: Annamaria Boots MD, Clinton D   . Anemia 10/06/2013  . Anginal pain    pt has history of CP states had cardiac workup with no specific issues identified   . Arthritis    knees; right thumb; shoulders  . Arthritis of left shoulder region 09/30/2016   X-ray of the left shoulder  on 09/14/2016: Marked degenerative change with significant osteophytic spurring and subchondral sclerosis.  Loss of glenohumeral joint space.  Well-maintained subacromial space.  Injected 09/30/2016 [name] .  Marland Kitchen Asthma, mild intermittent 04/02/2013  . Chicken pox as a child  . Colonic diverticular abscess 11/21/2012  . Constipation   . Decreased hearing   . Dermatitis 03/06/2017  . Diverticulitis 10/25/2012   pt. reports that a drain was placed - 09/2012    . Diverticulitis of rectosigmoid 11/28/2012  . Dry mouth   .  Essential hypertension 11/08/2010   Qualifier: Diagnosis of  By: Annamaria Boots MD, Clinton D   . Excessive thirst   . External hemorrhoid, bleeding    "sometimes" (01-07-2013)  . Frequent urination   . GERD (gastroesophageal reflux disease)   . H/O hiatal hernia   . Hand tingling   . Heart murmur   . History of kidney stones   . Hyperglycemia 01/17/2020  . Hyperlipidemia   . Hypothyroidism   . Incontinence   . Insomnia   . Kidney stones 1970's   "passed on their own" (2013-01-07)  . Low back pain 06/09/2014  . Measles as a child  . 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 Obesity 09/11/2017  . Obstructive sleep apnea 11/04/2010   NPSG [facility] 07/15/10- AHI 13.5/ hr CPAP 10/APS    . Otitis media 01/07/2014  . Palpitations   . Paresthesia 03/23/2015   Left face  . PONV (postoperative nausea and vomiting)   . Rheumatoid arteritis   . Shortness of breath dyspnea    using stairs  . Sinus pain   . Swelling of both lower extremities   . Thyroid cancer 1980's  . Tinnitus   . UTI (urinary tract infection) 04/02/2013  . Vertigo 08/05/2019  . Vitamin D deficiency 02/28/2018    Past Surgical History:  Procedure Laterality Date  . CHOLECYSTECTOMY  1990  . COLON SURGERY    . COLOSTOMY REVISION  01/07/2013   Procedure: COLON RESECTION SIGMOID;  Surgeon: Gwenyth Ober, MD;  Location: [place];  Service: General;  Laterality: N/A;  . CYSTOSCOPY WITH STENT PLACEMENT  07-Jan-2013   Procedure: CYSTOSCOPY WITH STENT PLACEMENT;  Surgeon: Hanley Ben, MD;  Location: [place];  Service: Urology;  Laterality: N/A;  . DILATION AND CURETTAGE OF UTERUS  1960's   "lots of them; had miscarriages" (January 07, 2013)  . LYSIS OF ADHESION N/A 12/02/2015   Procedure: LAPAROSCOPIC LYSIS OF ADHESION;  Surgeon: Johnathan Hausen, MD;  [facility];  Service: General;  Laterality: N/A;  . ROBOTIC ASSISTED BILATERAL SALPINGO OOPHERECTOMY Bilateral 12/02/2015   Procedure: XI ROBOTIC ASSISTED BILATERAL SALPINGO OOPHORECTOMY;  Surgeon: Everitt Amber, MD;  Location[facility];  Service: Gynecology;  Laterality: Bilateral;  . SIGMOID RESECTION / RECTOPEXY  01-07-2013  . THYROIDECTOMY, PARTIAL  1988   "then did iodine to remove the rest" (01/07/2013)  . TONSILLECTOMY  1951?  . TRANSRECTAL DRAINAGE OF PELVIC ABSCESS  10/27/2012  . VAGINAL HYSTERECTOMY  1970's   "still have my ovaries" (01-07-2013)    MEDICATIONS: . Apoaequorin (PREVAGEN EXTRA STRENGTH) 20 MG CAPS  . azelastine (ASTELIN) 0.1 % nasal spray  . carboxymethylcellulose (REFRESH PLUS) 0.5 % SOLN  . docusate sodium (COLACE) 100 MG capsule  . donepezil (ARICEPT) 10 MG tablet  . famotidine (PEPCID) 20 MG tablet  . Fish Oil-Krill Oil (MEGARED ADVANCED 4 IN 1 PO)  . hydrochlorothiazide (HYDRODIURIL) 25 MG tablet  . KETOCONAZOLE, TOPICAL, 1 % SHAM  . Krill Oil 350 MG CAPS  .  levothyroxine (SYNTHROID, LEVOTHROID) 150 MCG tablet  . losartan (COZAAR) 100 MG tablet  . metoprolol tartrate (LOPRESSOR) 100 MG tablet  . Multiple Vitamins-Minerals (MULTIVITAMIN WOMEN 50+ PO)  . naproxen (NAPROSYN) 375 MG tablet  . nitroGLYCERIN (NITROSTAT) 0.4 MG SL tablet  . omeprazole (PRILOSEC) 40 MG capsule  . polyethylene glycol (MIRALAX / GLYCOLAX) 17 g packet  . rosuvastatin (CRESTOR) 40 MG tablet  . Turmeric 500 MG CAPS   No current facility-administered medications for this encounter.   Konrad Felix, PA-C [facility] [phone]

## 2021-01-13 NOTE — Anesthesia Preprocedure Evaluation (Addendum)
Anesthesia Evaluation  Patient identified by MRN, date of birth, ID band Patient awake    Reviewed: Allergy & Precautions, H&P , NPO status , Patient's Chart, lab work & pertinent test results, reviewed documented beta blocker date and time   History of Anesthesia Complications (+) PONV  Airway Mallampati: II  TM Distance: >3 FB Neck ROM: Full    Dental no notable dental hx.    Pulmonary shortness of breath, asthma , sleep apnea ,    Pulmonary exam normal breath sounds clear to auscultation       Cardiovascular hypertension, Pt. on medications and Pt. on home beta blockers + angina Normal cardiovascular exam Rhythm:Regular Rate:Normal     Neuro/Psych  Headaches, negative psych ROS   GI/Hepatic Neg liver ROS, GERD  ,  Endo/Other  Hypothyroidism   Renal/GU negative Renal ROS  negative genitourinary   Musculoskeletal  (+) Arthritis , Osteoarthritis,    Abdominal (+) + obese,   Peds negative pediatric ROS (+)  Hematology negative hematology ROS (+)   Anesthesia Other Findings   Reproductive/Obstetrics negative OB ROS                            Anesthesia Physical Anesthesia Plan  ASA: III  Anesthesia Plan: Spinal and Regional   Post-op Pain Management:  Regional for Post-op pain   Induction: Intravenous  PONV Risk Score and Plan: 3 and Ondansetron, Midazolam, Dexamethasone and Treatment may vary due to age or medical condition  Airway Management Planned: Simple Face Mask  Additional Equipment:   Intra-op Plan:   Post-operative Plan:   Informed Consent: I have reviewed the patients History and Physical, chart, labs and discussed the procedure including the risks, benefits and alternatives for the proposed anesthesia with the patient or authorized representative who has indicated his/her understanding and acceptance.     Dental advisory given  Plan Discussed with:  CRNA  Anesthesia Plan Comments: (See PAT note 01/12/2021, Konrad Felix, PA-C)       Anesthesia Quick Evaluation

## 2021-01-15 ENCOUNTER — Other Ambulatory Visit (HOSPITAL_COMMUNITY)
Admission: RE | Admit: 2021-01-15 | Discharge: 2021-01-15 | Disposition: A | Discharge status: Home / Self Care | Payer: Medicare HMO | Source: Ambulatory Visit | Attending: Orthopedic Surgery | Admitting: Orthopedic Surgery

## 2021-01-15 DIAGNOSIS — Z20822 Contact with and (suspected) exposure to covid-19: Secondary | ICD-10-CM | POA: Insufficient documentation

## 2021-01-15 DIAGNOSIS — Z01812 Encounter for preprocedural laboratory examination: Secondary | ICD-10-CM | POA: Insufficient documentation

## 2021-01-15 LAB — SARS CORONAVIRUS 2 (TAT 6-24 HRS): SARS Coronavirus 2: NEGATIVE

## 2021-01-15 NOTE — H&P (Signed)
TOTAL KNEE ADMISSION H&P  Patient is being admitted for left total knee arthroplasty.  Subjective:  Chief Complaint: Left knee pain.  HPI: DESIREA MIZRAHI, 75 y.o. female has a history of pain and functional disability in the left knee due to arthritis and has failed non-surgical conservative treatments for greater than 12 weeks to include corticosteriod injections and activity modification. Onset of symptoms was gradual, starting several years ago with gradually worsening course since that time. The patient noted no past surgery on the left knee.  Patient currently rates pain in the left knee at 8 out of 10 with activity. Patient has worsening of pain with activity and weight bearing, pain that interferes with activities of daily living and crepitus. Patient has evidence of bone-on-bone arthritis in all 3 compartments of the left knee by imaging studies. There is no active infection.  Patient Active Problem List   Diagnosis Date Noted  . Osteoarthritis of left knee 11/21/2020  . 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 Hyperglycemia 01/17/2020  . Vertigo 08/05/2019  . Palpitations 05/28/2019  . Other fatigue 02/28/2018  . Shortness of breath on exertion 02/28/2018  . Vitamin D deficiency 02/28/2018  . Obesity 09/11/2017  . Dermatitis 03/06/2017  . Arthritis of left shoulder region 09/30/2016  . Pain of joint of left ankle and foot 09/15/2016  . Left shoulder pain 09/14/2016  . Preventative health care 02/26/2016  . Radicular pain of sacrum 02/26/2016  . Vitamin B12 deficiency 02/26/2016  . Ventral hernia 12/02/2015  . Pelvic mass in female 12/02/2015  . Colitis 09/14/2015  . Paresthesia 03/23/2015  . Otitis, externa, infective 03/13/2015  . Hyponatremia 03/13/2015  . Cystitis 09/10/2014  . Headache 08/21/2014  . Low back pain 06/09/2014  . Otitis media 01/07/2014  . Anemia 10/06/2013  . Medicare annual wellness visit, subsequent 10/06/2013  . Asthma, mild intermittent  04/02/2013  . Internal hemorrhoid, bleeding 01/23/2013  . Diverticulitis of rectosigmoid 11/28/2012  . Colonic diverticular abscess 11/21/2012  . Abnormal cervical cytology 10/25/2012  . Cancer ([place])   . Insomnia   . Hypothyroidism   . Constipation   . Cough 09/02/2012  . Chest pain 08/05/2011  . Hyperlipidemia, mixed 11/08/2010  . Essential hypertension 11/08/2010  . GERD (gastroesophageal reflux disease) 11/08/2010  . Obstructive sleep apnea 11/04/2010  . Allergic rhinitis 11/04/2010    Past Medical History:  Diagnosis Date  . Abnormal cervical cytology 10/25/2012   Follows with Dr Leavy Cella of Gyn  . Allergic rhinitis 11/04/2010   Qualifier: Diagnosis of  By: Annamaria Boots MD, Clinton D   . Anemia 10/06/2013  . Anginal pain    pt has history of CP states had cardiac workup with no specific issues identified   . Arthritis    knees; right thumb; shoulders  . 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  Loss of glenohumeral joint space.  Well-maintained subacromial space.  Injected 09/30/2016 [name] .  Marland Kitchen Asthma, mild intermittent 04/02/2013  . Chicken pox as a child  . Colonic diverticular abscess 11/21/2012  . Constipation   . Decreased hearing   . Dermatitis 03/06/2017  . Diverticulitis 10/25/2012   pt. reports that a drain was placed - 09/2012    . Diverticulitis of rectosigmoid 11/28/2012  . Dry mouth   . Essential hypertension 11/08/2010   Qualifier: Diagnosis of  By: Annamaria Boots MD, Clinton D   . Excessive thirst   .  External hemorrhoid, bleeding    "sometimes" (Jan 09, 2013)  . Frequent urination   . GERD (gastroesophageal reflux disease)   . H/O hiatal hernia   . Hand tingling   . Heart murmur   . History of kidney stones   . Hyperglycemia 01/17/2020  . Hyperlipidemia   . Hypothyroidism   . Incontinence   . Insomnia   . Kidney stones  1970's   "passed on their own" (Jan 09, 2013)  . Low back pain 06/09/2014  . Measles as a child  . 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 Obesity 09/11/2017  . Obstructive sleep apnea 11/04/2010   NPSG [facility] 07/15/10- AHI 13.5/ hr CPAP 10/APS    . Otitis media 01/07/2014  . Palpitations   . Paresthesia 03/23/2015   Left face  . PONV (postoperative nausea and vomiting)   . Rheumatoid arteritis   . Shortness of breath dyspnea    using stairs  . Sinus pain   . Swelling of both lower extremities   . Thyroid cancer 1980's  . Tinnitus   . UTI (urinary tract infection) 04/02/2013  . Vertigo 08/05/2019  . Vitamin D deficiency 02/28/2018    Past Surgical History:  Procedure Laterality Date  . CHOLECYSTECTOMY  1990  . COLON SURGERY    . COLOSTOMY REVISION  Jan 09, 2013   Procedure: COLON RESECTION SIGMOID;  Surgeon: Gwenyth Ober, MD;  Location: [facility];  Service: General;  Laterality: N/A;  . CYSTOSCOPY WITH STENT PLACEMENT  01/09/13   Procedure: CYSTOSCOPY WITH STENT PLACEMENT;  Surgeon: Hanley Ben, MD;  Location: [facility];  Service: Urology;  Laterality: N/A;  . DILATION AND CURETTAGE OF UTERUS  1960's   "lots of them; had miscarriages" (09-Jan-2013)  . LYSIS OF ADHESION N/A 12/02/2015   Procedure: LAPAROSCOPIC LYSIS OF ADHESION;  Surgeon: Johnathan Hausen, MD;  Location: [facility];  Service: General;  Laterality: N/A;  . ROBOTIC ASSISTED BILATERAL SALPINGO OOPHERECTOMY Bilateral 12/02/2015   Procedure: XI ROBOTIC ASSISTED BILATERAL SALPINGO OOPHORECTOMY;  Surgeon: Everitt Amber, MD;  Location: [facility];  Service: Gynecology;  Laterality: Bilateral;  . SIGMOID RESECTION / RECTOPEXY  January 09, 2013  . THYROIDECTOMY, PARTIAL  1988   "then did iodine to remove the rest" (January 09, 2013)  . TONSILLECTOMY  1951?  . TRANSRECTAL DRAINAGE OF PELVIC ABSCESS  10/27/2012  . VAGINAL HYSTERECTOMY  1970's   "still have my ovaries" (January 09, 2013)    Prior to Admission medications   Medication Sig Start Date End Date Taking?  Authorizing Provider  Apoaequorin (PREVAGEN EXTRA STRENGTH) 20 MG CAPS Take 20 mg by mouth daily.   Yes [provider]  azelastine (ASTELIN) 0.1 % nasal spray Place 2 sprays into both nostrils 2 (two) times daily. 06/10/20  Yes Mosie Lukes, MD  carboxymethylcellulose (REFRESH PLUS) 0.5 % SOLN Place 1 drop into both eyes 3 (three) times daily as needed (dry eyes).   Yes [provider]  docusate sodium (COLACE) 100 MG capsule Take 100 mg by mouth daily.   Yes [provider]  famotidine (PEPCID) 20 MG tablet TAKE 2 TABLETS BY MOUTH EVERY DAY Patient taking differently: Take 40 mg by mouth at bedtime. 12/17/20  Yes Mosie Lukes, MD  Fish Oil-Krill Oil [facility] ADVANCED 4 IN 1 PO) Take 1 capsule by mouth daily.   Yes [provider]  Javier Docker Oil 350 MG CAPS Take 350 mg by mouth every evening.   Yes [provider]  levothyroxine (SYNTHROID, LEVOTHROID) 150 MCG tablet Take 150 mcg by mouth daily before breakfast.  Yes [provider]  losartan (COZAAR) 100 MG tablet TAKE 1 TABLET BY MOUTH EVERYDAY AT BEDTIME Patient taking differently: Take 100 mg by mouth at bedtime. 10/14/20  Yes Mosie Lukes, MD  metoprolol tartrate (LOPRESSOR) 100 MG tablet TAKE 1&1/2 TABLETS BY MOUTH TWICE A DAY Patient taking differently: Take 150 mg by mouth 2 (two) times daily. 09/13/20  Yes Mosie Lukes, MD  Multiple Vitamins-Minerals (MULTIVITAMIN WOMEN 50+ PO) Take 1 tablet by mouth daily.   Yes [provider]  naproxen (NAPROSYN) 375 MG tablet TAKE 1 TABLET BY MOUTH 2 (TWO) TIMES DAILY WITH A MEAL. Patient taking differently: Take 375 mg by mouth 2 (two) times daily with a meal. 08/04/20  Yes Mosie Lukes, MD  nitroGLYCERIN (NITROSTAT) 0.4 MG SL tablet PLACE 1 TABLET UNDER THE TONGUE EVERY 5 MINUTES AS NEEDED FOR CHEST PAIN 11/07/18  Yes Mosie Lukes, MD  omeprazole (PRILOSEC) 40 MG capsule TAKE 1 CAPSULE BY MOUTH TWICE A DAY Patient taking  differently: Take 40 mg by mouth in the morning and at bedtime. 12/17/20  Yes Mosie Lukes, MD  polyethylene glycol (MIRALAX / GLYCOLAX) 17 g packet Take 17 g by mouth daily.   Yes [provider]  rosuvastatin (CRESTOR) 40 MG tablet TAKE 1 TABLET BY MOUTH EVERY DAY Patient taking differently: Take 40 mg by mouth daily. 12/03/20  Yes Mosie Lukes, MD  Turmeric 500 MG CAPS Take 500 mg by mouth daily.   Yes [provider]  donepezil (ARICEPT) 10 MG tablet Take 1/2 tablet daily for 2 weeks, then increase to 1 tablet daily Patient not taking: Reported on 01/01/2021 06/24/20   Cameron Sprang, MD  hydrochlorothiazide (HYDRODIURIL) 25 MG tablet Take 1 tablet (25 mg total) by mouth daily. 01/06/21   Mosie Lukes, MD  KETOCONAZOLE, TOPICAL, 1 % SHAM Apply 1 Dose topically once a week. Patient not taking: Reported on 01/01/2021 03/03/18   Mosie Lukes, MD    Allergies  Allergen Reactions  . Neomycin-Bacitracin Zn-Polymyx Rash    Polysporin- is tolerated   . Niacin Other (See Comments) and Cough    "cough til I threw up" (12/19/2012)  . Ciprofloxacin Hives    Got cipro and flagyl at same time, localized hives to IV arm  . Flagyl [Metronidazole] Hives    Got cipro and flagyl at same time, localized hives to IV arm    Social History   Socioeconomic History  . Marital status: Married    Spouse name: [name]  . Number of children: 2  . Years of education: 76  . Highest education level: High school graduate  Occupational History  . Occupation: Retired  Tobacco Use  . Smoking status: Never Smoker  . Smokeless tobacco: Never Used  Vaping Use  . Vaping Use: Never used  Substance and Sexual Activity  . Alcohol use: No    Alcohol/week: 0.0 standard drinks  . Drug use: No  . Sexual activity: Not Currently  Other Topics Concern  . Not on file  Social History Narrative   Married    Children   Right handed   Some college   Social Determinants of Health   Financial  Resource Strain: Low Risk   . Difficulty of Paying Living Expenses: Not hard at all  Food Insecurity: No Food Insecurity  . Worried About Charity fundraiser in the Last Year: Never true  . Ran Out of Food in the Last Year: Never true  Transportation Needs: No Transportation Needs  . Lack of Transportation (Medical): No  . Lack of Transportation (Non-Medical): No  Physical Activity: Not on file  Stress: Not on file  Social Connections: Not on file  Intimate Partner Violence: Not on file    Tobacco Use: Low Risk   . Smoking Tobacco Use: Never Smoker  . Smokeless Tobacco Use: Never Used   Social History   Substance and Sexual Activity  Alcohol Use No  . Alcohol/week: 0.0 standard drinks    Family History  Problem Relation Age of Onset  . Heart disease Father   . Pneumonia Father   . Hypertension Father   . Hyperlipidemia Father   . Cancer Father        skin  . Stroke Father   . Alzheimer's disease Mother   . Heart disease Mother   . Depression Mother   . Emphysema Brother        marijuana and cigarettes  . Alcohol abuse Brother   . Hearing loss Brother   . Diabetes Maternal Grandmother   . Alzheimer's disease Paternal Grandmother   . Cancer Paternal Grandmother        lung?- smoker  . Hyperlipidemia Paternal Grandmother   . Heart attack Paternal Grandfather   . Alcohol abuse Paternal Grandfather   . Neurofibromatosis Son        schwanomatosis  . Neurofibromatosis Son        swanomatosis  . Cancer Paternal Aunt     Review of Systems  Constitutional: Negative for chills and fever.  HENT: Negative for congestion, sore throat and tinnitus.   Eyes: Negative for double vision, photophobia and pain.  Respiratory: Negative for cough, shortness of breath and wheezing.   Cardiovascular: Negative for chest pain, palpitations and orthopnea.  Gastrointestinal: Negative for heartburn, nausea and vomiting.  Genitourinary: Negative for dysuria, frequency and urgency.   Musculoskeletal: Positive for joint pain.  Neurological: Negative for dizziness, weakness and headaches.    Objective:  Physical Exam: Well nourished and well developed.  General: Alert and oriented x3, cooperative and pleasant, no acute distress.  Head: normocephalic, atraumatic, neck supple.  Eyes: EOMI.  Respiratory: breath sounds clear in all fields, no wheezing, rales, or rhonchi. Cardiovascular: Regular rate and rhythm, no murmurs, gallops or rubs.  Abdomen: non-tender to palpation and soft, normoactive bowel sounds. Musculoskeletal:  Left Knee Exam:  No effusion present. No swelling present.  Significant varus deformity.  The range of motion is: 5 to 120 degrees.  Marked crepitus on range of motion of the knee.  Positive medial greater than lateral joint line tenderness.  The knee is stable.   Calves soft and nontender. Motor function intact in LE. Strength 5/5 LE bilaterally. Neuro: Distal pulses 2+. Sensation to light touch intact in LE.  Imaging Review Plain radiographs demonstrate severe degenerative joint disease of the left knee. The overall alignment is significant varus. The bone quality appears to be adequate for age and reported activity level.  Assessment/Plan:  End stage arthritis, left knee   The patient history, physical examination, clinical judgment of the provider and imaging studies are consistent with end stage degenerative joint disease of the left knee and total knee arthroplasty is deemed medically necessary. The treatment options including medical management, injection therapy arthroscopy and arthroplasty were discussed at length. The risks and benefits of total knee arthroplasty were presented and reviewed. The risks due to aseptic loosening, infection, stiffness, patella tracking problems, thromboembolic complications and other imponderables were discussed.  The patient acknowledged the explanation, agreed to proceed with the plan and consent was  signed. Patient is being admitted for inpatient treatment for surgery, pain control, PT, OT, prophylactic antibiotics, VTE prophylaxis, progressive ambulation and ADLs and discharge planning. The patient is planning to be discharged home.   Patient's anticipated LOS is less than 2 midnights, meeting these requirements: - Younger than 45 - Lives within 1 hour of care - Has a competent adult at home to recover with post-op recover - NO history of  - Chronic pain requiring opiods  - Diabetes  - Coronary Artery Disease  - Heart failure  - Heart attack  - Stroke  - DVT/VTE  - Cardiac arrhythmia  - Respiratory Failure/COPD  - Renal failure  - Anemia  - Advanced Liver disease  Therapy Plans: HHPT followed by outpatient [facility] PT Disposition: Home with husband and son Planned DVT Prophylaxis: Xarelto 10 mg QD DME Needed: Gilford Rile PCP: Gwyneth Revels, MD (clearance received) Cardiologist: Jenkins Rouge, MD (clearance received) TXA: IV Allergies: Ciprofloxacin, neomycin Anesthesia Concerns: None BMI: 38.6 Last HgbA1c: Not diabetic.  Ph[facility]: [facility]  Other:  - Helps take care of husband, will need HHPT for first two weeks - History thyroid cancer  - Patient was instructed on what medications to stop prior to surgery. - Follow-up visit in 2 weeks with Dr. Wynelle Link - Begin physical therapy following surgery - Pre-operative lab work as pre-surgical testing - Prescriptions will be provided in hospital at time of discharge  Theresa Duty, PA-C Orthopedic Surgery [facility] [place]

## 2021-01-18 MED ORDER — BUPIVACAINE LIPOSOME 1.3 % IJ SUSP
20.0000 mL | Freq: Once | INTRAMUSCULAR | Status: DC
  Filled 2021-01-18: qty 20

## 2021-01-19 ENCOUNTER — Encounter (HOSPITAL_COMMUNITY)
Admission: RE | Disposition: A | Discharge status: Home / Self Care | Payer: Self-pay | Source: Ambulatory Visit | Attending: Orthopedic Surgery

## 2021-01-19 ENCOUNTER — Encounter (HOSPITAL_COMMUNITY): Payer: Self-pay | Admitting: Orthopedic Surgery

## 2021-01-19 ENCOUNTER — Observation Stay (HOSPITAL_COMMUNITY)
Admission: RE | Admit: 2021-01-19 | Discharge: 2021-01-20 | Disposition: A | Discharge status: Home / Self Care | Payer: Medicare HMO | Source: Ambulatory Visit | Attending: Orthopedic Surgery | Admitting: Orthopedic Surgery

## 2021-01-19 ENCOUNTER — Ambulatory Visit (HOSPITAL_COMMUNITY): Payer: Medicare HMO | Admitting: Physician Assistant

## 2021-01-19 ENCOUNTER — Ambulatory Visit (HOSPITAL_COMMUNITY): Payer: Medicare HMO | Admitting: Certified Registered Nurse Anesthetist

## 2021-01-19 ENCOUNTER — Other Ambulatory Visit: Payer: Self-pay

## 2021-01-19 DIAGNOSIS — M1712 Unilateral primary osteoarthritis, left knee: Principal | ICD-10-CM | POA: Diagnosis present

## 2021-01-19 DIAGNOSIS — E039 Hypothyroidism, unspecified: Secondary | ICD-10-CM | POA: Diagnosis not present

## 2021-01-19 DIAGNOSIS — Z85850 Personal history of malignant neoplasm of thyroid: Secondary | ICD-10-CM | POA: Insufficient documentation

## 2021-01-19 DIAGNOSIS — I10 Essential (primary) hypertension: Secondary | ICD-10-CM | POA: Insufficient documentation

## 2021-01-19 DIAGNOSIS — J4520 Mild intermittent asthma, uncomplicated: Secondary | ICD-10-CM | POA: Insufficient documentation

## 2021-01-19 DIAGNOSIS — E559 Vitamin D deficiency, unspecified: Secondary | ICD-10-CM | POA: Diagnosis not present

## 2021-01-19 DIAGNOSIS — E871 Hypo-osmolality and hyponatremia: Secondary | ICD-10-CM | POA: Diagnosis not present

## 2021-01-19 DIAGNOSIS — Z79899 Other long term (current) drug therapy: Secondary | ICD-10-CM | POA: Diagnosis not present

## 2021-01-19 DIAGNOSIS — G8918 Other acute postprocedural pain: Secondary | ICD-10-CM | POA: Diagnosis not present

## 2021-01-19 HISTORY — PX: TOTAL KNEE ARTHROPLASTY: SHX125

## 2021-01-19 LAB — TYPE AND SCREEN
ABO/RH(D): A POS
Antibody Screen: NEGATIVE

## 2021-01-19 SURGERY — ARTHROPLASTY, KNEE, TOTAL
Anesthesia: Regional | Site: Knee | Laterality: Left

## 2021-01-19 MED ORDER — MIDAZOLAM HCL 2 MG/2ML IJ SOLN
1.0000 mg | INTRAMUSCULAR | Status: DC
  Administered 2021-01-19: 2 mg via INTRAVENOUS
  Filled 2021-01-19: qty 2

## 2021-01-19 MED ORDER — PROPOFOL 500 MG/50ML IV EMUL
INTRAVENOUS | Status: AC
  Filled 2021-01-19: qty 50

## 2021-01-19 MED ORDER — ONDANSETRON HCL 4 MG PO TABS
4.0000 mg | ORAL_TABLET | Freq: Four times a day (QID) | ORAL | Status: DC | PRN
  Administered 2021-01-19: 4 mg via ORAL
  Filled 2021-01-19: qty 1

## 2021-01-19 MED ORDER — LIDOCAINE 2% (20 MG/ML) 5 ML SYRINGE
INTRAMUSCULAR | Status: DC | PRN
  Administered 2021-01-19: 50 mg via INTRAVENOUS

## 2021-01-19 MED ORDER — ROPIVACAINE HCL 5 MG/ML IJ SOLN
INTRAMUSCULAR | Status: DC | PRN
  Administered 2021-01-19: 20 mL via PERINEURAL

## 2021-01-19 MED ORDER — LEVOTHYROXINE SODIUM 150 MCG PO TABS
150.0000 ug | ORAL_TABLET | Freq: Every day | ORAL | Status: DC
  Administered 2021-01-20: 150 ug via ORAL
  Filled 2021-01-19: qty 1
  Filled 2021-01-19: qty 2

## 2021-01-19 MED ORDER — HYDROMORPHONE HCL 1 MG/ML IJ SOLN
INTRAMUSCULAR | Status: AC
Start: 2021-01-19 — End: 2021-01-20
  Filled 2021-01-19: qty 1

## 2021-01-19 MED ORDER — METHOCARBAMOL 500 MG PO TABS
500.0000 mg | ORAL_TABLET | Freq: Four times a day (QID) | ORAL | Status: DC | PRN
  Administered 2021-01-19: 500 mg via ORAL
  Filled 2021-01-19: qty 1

## 2021-01-19 MED ORDER — BISACODYL 10 MG RE SUPP
10.0000 mg | Freq: Every day | RECTAL | Status: DC | PRN
Start: 2021-01-19 — End: 2021-01-20

## 2021-01-19 MED ORDER — TRANEXAMIC ACID-NACL 1000-0.7 MG/100ML-% IV SOLN
1000.0000 mg | INTRAVENOUS | Status: AC
  Administered 2021-01-19: 1000 mg via INTRAVENOUS
  Filled 2021-01-19: qty 100

## 2021-01-19 MED ORDER — METOCLOPRAMIDE HCL 5 MG PO TABS: 5.0000 mg | ORAL_TABLET | Freq: Three times a day (TID) | ORAL | Status: DC | PRN

## 2021-01-19 MED ORDER — OXYCODONE HCL 5 MG/5ML PO SOLN: 5.0000 mg | Freq: Once | ORAL | Status: DC | PRN

## 2021-01-19 MED ORDER — NITROGLYCERIN 0.4 MG SL SUBL: 0.4000 mg | SUBLINGUAL_TABLET | SUBLINGUAL | Status: DC | PRN

## 2021-01-19 MED ORDER — SODIUM CHLORIDE (PF) 0.9 % IJ SOLN
INTRAMUSCULAR | Status: DC | PRN
  Administered 2021-01-19: 60 mL

## 2021-01-19 MED ORDER — METOPROLOL TARTRATE 50 MG PO TABS
150.0000 mg | ORAL_TABLET | Freq: Two times a day (BID) | ORAL | Status: DC
  Administered 2021-01-19 – 2021-01-20 (×2): 150 mg via ORAL
  Filled 2021-01-19 (×2): qty 3

## 2021-01-19 MED ORDER — LACTATED RINGERS IV SOLN: INTRAVENOUS | Status: DC

## 2021-01-19 MED ORDER — METHOCARBAMOL 500 MG IVPB - SIMPLE MED
500.0000 mg | Freq: Four times a day (QID) | INTRAVENOUS | Status: DC | PRN
  Administered 2021-01-19: 500 mg via INTRAVENOUS
  Filled 2021-01-19: qty 50

## 2021-01-19 MED ORDER — TRAMADOL HCL 50 MG PO TABS
50.0000 mg | ORAL_TABLET | Freq: Four times a day (QID) | ORAL | Status: DC | PRN
  Administered 2021-01-19: 100 mg via ORAL
  Filled 2021-01-19: qty 2

## 2021-01-19 MED ORDER — RIVAROXABAN 10 MG PO TABS
10.0000 mg | ORAL_TABLET | Freq: Every day | ORAL | Status: DC
  Administered 2021-01-20: 10 mg via ORAL
  Filled 2021-01-19: qty 1

## 2021-01-19 MED ORDER — PROMETHAZINE HCL 25 MG/ML IJ SOLN: 6.2500 mg | INTRAMUSCULAR | Status: DC | PRN

## 2021-01-19 MED ORDER — DIPHENHYDRAMINE HCL 12.5 MG/5ML PO ELIX: 12.5000 mg | ORAL_SOLUTION | ORAL | Status: DC | PRN

## 2021-01-19 MED ORDER — PROPOFOL 500 MG/50ML IV EMUL
INTRAVENOUS | Status: DC | PRN
  Administered 2021-01-19: 25 ug/kg/min via INTRAVENOUS

## 2021-01-19 MED ORDER — ACETAMINOPHEN 500 MG PO TABS
1000.0000 mg | ORAL_TABLET | Freq: Four times a day (QID) | ORAL | Status: AC
  Administered 2021-01-19 – 2021-01-20 (×4): 1000 mg via ORAL
  Filled 2021-01-19 (×4): qty 2

## 2021-01-19 MED ORDER — DEXAMETHASONE SODIUM PHOSPHATE 10 MG/ML IJ SOLN
10.0000 mg | Freq: Once | INTRAMUSCULAR | Status: AC
  Administered 2021-01-20: 10 mg via INTRAVENOUS
  Filled 2021-01-19: qty 1

## 2021-01-19 MED ORDER — SODIUM CHLORIDE 0.9 % IR SOLN
Status: DC | PRN
  Administered 2021-01-19: 1000 mL

## 2021-01-19 MED ORDER — PANTOPRAZOLE SODIUM 40 MG PO TBEC
40.0000 mg | DELAYED_RELEASE_TABLET | Freq: Every day | ORAL | Status: DC
  Administered 2021-01-19 – 2021-01-20 (×2): 40 mg via ORAL
  Filled 2021-01-19 (×2): qty 1

## 2021-01-19 MED ORDER — HYDROMORPHONE HCL 1 MG/ML IJ SOLN
0.2500 mg | INTRAMUSCULAR | Status: DC | PRN
  Administered 2021-01-19 (×2): 0.5 mg via INTRAVENOUS

## 2021-01-19 MED ORDER — CARBOXYMETHYLCELLULOSE SODIUM 0.5 % OP SOLN: 1.0000 [drp] | Freq: Three times a day (TID) | OPHTHALMIC | Status: DC | PRN

## 2021-01-19 MED ORDER — STERILE WATER FOR IRRIGATION IR SOLN
Status: DC | PRN
  Administered 2021-01-19: 2000 mL

## 2021-01-19 MED ORDER — SODIUM CHLORIDE 0.9 % IV SOLN: INTRAVENOUS | Status: DC

## 2021-01-19 MED ORDER — ONDANSETRON HCL 4 MG/2ML IJ SOLN
INTRAMUSCULAR | Status: AC
  Filled 2021-01-19: qty 2

## 2021-01-19 MED ORDER — EPHEDRINE SULFATE 50 MG/ML IJ SOLN
INTRAMUSCULAR | Status: DC | PRN
  Administered 2021-01-19: 10 mg via INTRAVENOUS

## 2021-01-19 MED ORDER — PHENOL 1.4 % MT LIQD: 1.0000 | OROMUCOSAL | Status: DC | PRN

## 2021-01-19 MED ORDER — POVIDONE-IODINE 10 % EX SWAB
2.0000 "application " | Freq: Once | CUTANEOUS | Status: AC
  Administered 2021-01-19: 2 via TOPICAL

## 2021-01-19 MED ORDER — DEXAMETHASONE SODIUM PHOSPHATE 10 MG/ML IJ SOLN
INTRAMUSCULAR | Status: AC
  Filled 2021-01-19: qty 1

## 2021-01-19 MED ORDER — POLYVINYL ALCOHOL 1.4 % OP SOLN
1.0000 [drp] | Freq: Three times a day (TID) | OPHTHALMIC | Status: DC | PRN
  Filled 2021-01-19: qty 15

## 2021-01-19 MED ORDER — ONDANSETRON HCL 4 MG/2ML IJ SOLN
INTRAMUSCULAR | Status: DC | PRN
  Administered 2021-01-19: 4 mg via INTRAVENOUS

## 2021-01-19 MED ORDER — HYDROCHLOROTHIAZIDE 25 MG PO TABS
25.0000 mg | ORAL_TABLET | Freq: Every day | ORAL | Status: DC
Start: 2021-01-20 — End: 2021-01-20
  Administered 2021-01-20: 25 mg via ORAL
  Filled 2021-01-19: qty 1

## 2021-01-19 MED ORDER — FLEET ENEMA 7-19 GM/118ML RE ENEM: 1.0000 | ENEMA | Freq: Once | RECTAL | Status: DC | PRN

## 2021-01-19 MED ORDER — CEFAZOLIN SODIUM-DEXTROSE 2-4 GM/100ML-% IV SOLN
2.0000 g | INTRAVENOUS | Status: AC
  Administered 2021-01-19: 2 g via INTRAVENOUS
  Filled 2021-01-19: qty 100

## 2021-01-19 MED ORDER — EPHEDRINE 5 MG/ML INJ
INTRAVENOUS | Status: AC
  Filled 2021-01-19: qty 20

## 2021-01-19 MED ORDER — OXYCODONE HCL 5 MG PO TABS: 5.0000 mg | ORAL_TABLET | Freq: Once | ORAL | Status: DC | PRN

## 2021-01-19 MED ORDER — ROSUVASTATIN CALCIUM 20 MG PO TABS
40.0000 mg | ORAL_TABLET | Freq: Every day | ORAL | Status: DC
  Administered 2021-01-20: 40 mg via ORAL
  Filled 2021-01-19: qty 2

## 2021-01-19 MED ORDER — METOCLOPRAMIDE HCL 5 MG/ML IJ SOLN: 5.0000 mg | Freq: Three times a day (TID) | INTRAMUSCULAR | Status: DC | PRN

## 2021-01-19 MED ORDER — PROPOFOL 10 MG/ML IV BOLUS
INTRAVENOUS | Status: AC
  Filled 2021-01-19: qty 20

## 2021-01-19 MED ORDER — ORAL CARE MOUTH RINSE
15.0000 mL | Freq: Once | OROMUCOSAL | Status: AC
  Administered 2021-01-19: 15 mL via OROMUCOSAL

## 2021-01-19 MED ORDER — MENTHOL 3 MG MT LOZG: 1.0000 | LOZENGE | OROMUCOSAL | Status: DC | PRN

## 2021-01-19 MED ORDER — CEFAZOLIN SODIUM-DEXTROSE 2-4 GM/100ML-% IV SOLN
2.0000 g | Freq: Four times a day (QID) | INTRAVENOUS | Status: AC
  Administered 2021-01-19 – 2021-01-20 (×2): 2 g via INTRAVENOUS
  Filled 2021-01-19 (×2): qty 100

## 2021-01-19 MED ORDER — FAMOTIDINE 20 MG PO TABS
40.0000 mg | ORAL_TABLET | Freq: Every day | ORAL | Status: DC
  Administered 2021-01-19: 40 mg via ORAL
  Filled 2021-01-19: qty 2

## 2021-01-19 MED ORDER — METHOCARBAMOL 500 MG IVPB - SIMPLE MED
INTRAVENOUS | Status: AC
  Filled 2021-01-19: qty 50

## 2021-01-19 MED ORDER — POLYETHYLENE GLYCOL 3350 17 G PO PACK: 17.0000 g | PACK | Freq: Every day | ORAL | Status: DC | PRN

## 2021-01-19 MED ORDER — CHLORHEXIDINE GLUCONATE 0.12 % MT SOLN: 15.0000 mL | Freq: Once | OROMUCOSAL | Status: AC

## 2021-01-19 MED ORDER — ONDANSETRON HCL 4 MG/2ML IJ SOLN
4.0000 mg | Freq: Four times a day (QID) | INTRAMUSCULAR | Status: DC | PRN
  Filled 2021-01-19: qty 2

## 2021-01-19 MED ORDER — OXYCODONE HCL 5 MG PO TABS
5.0000 mg | ORAL_TABLET | ORAL | Status: DC | PRN
  Administered 2021-01-19: 10 mg via ORAL
  Administered 2021-01-20: 5 mg via ORAL
  Administered 2021-01-20: 10 mg via ORAL
  Filled 2021-01-19: qty 1
  Filled 2021-01-19 (×2): qty 2

## 2021-01-19 MED ORDER — MORPHINE SULFATE (PF) 4 MG/ML IV SOLN: 0.5000 mg | INTRAVENOUS | Status: DC | PRN

## 2021-01-19 MED ORDER — BUPIVACAINE IN DEXTROSE 0.75-8.25 % IT SOLN
INTRATHECAL | Status: DC | PRN
  Administered 2021-01-19: 1.8 mL via INTRATHECAL

## 2021-01-19 MED ORDER — PROPOFOL 500 MG/50ML IV EMUL
INTRAVENOUS | Status: DC | PRN
  Administered 2021-01-19: 100 mg via INTRAVENOUS
  Administered 2021-01-19 (×2): 50 mg via INTRAVENOUS
  Administered 2021-01-19: 30 mg via INTRAVENOUS

## 2021-01-19 MED ORDER — BUPIVACAINE LIPOSOME 1.3 % IJ SUSP
INTRAMUSCULAR | Status: DC | PRN
  Administered 2021-01-19: 20 mL

## 2021-01-19 MED ORDER — DEXAMETHASONE SODIUM PHOSPHATE 10 MG/ML IJ SOLN
8.0000 mg | Freq: Once | INTRAMUSCULAR | Status: AC
  Administered 2021-01-19: 8 mg via INTRAVENOUS

## 2021-01-19 MED ORDER — ACETAMINOPHEN 10 MG/ML IV SOLN
1000.0000 mg | Freq: Four times a day (QID) | INTRAVENOUS | Status: DC
  Administered 2021-01-19: 1000 mg via INTRAVENOUS
  Filled 2021-01-19: qty 100

## 2021-01-19 MED ORDER — HYDROMORPHONE HCL 1 MG/ML IJ SOLN
INTRAMUSCULAR | Status: AC
  Administered 2021-01-19: 0.5 mg via INTRAVENOUS
  Filled 2021-01-19: qty 1

## 2021-01-19 MED ORDER — DOCUSATE SODIUM 100 MG PO CAPS
100.0000 mg | ORAL_CAPSULE | Freq: Two times a day (BID) | ORAL | Status: DC
  Administered 2021-01-19 – 2021-01-20 (×2): 100 mg via ORAL
  Filled 2021-01-19 (×2): qty 1

## 2021-01-19 MED ORDER — GABAPENTIN 100 MG PO CAPS
100.0000 mg | ORAL_CAPSULE | Freq: Three times a day (TID) | ORAL | Status: DC
  Administered 2021-01-19 – 2021-01-20 (×3): 100 mg via ORAL
  Filled 2021-01-19 (×3): qty 1

## 2021-01-19 MED ORDER — FENTANYL CITRATE (PF) 100 MCG/2ML IJ SOLN
50.0000 ug | INTRAMUSCULAR | Status: DC
  Administered 2021-01-19: 50 ug via INTRAVENOUS
  Filled 2021-01-19: qty 2

## 2021-01-19 MED ORDER — LOSARTAN POTASSIUM 50 MG PO TABS: 100.0000 mg | ORAL_TABLET | Freq: Every day | ORAL | Status: DC

## 2021-01-19 SURGICAL SUPPLY — 54 items
BAG SPEC THK2 15X12 ZIP CLS (MISCELLANEOUS) ×1
BAG ZIPLOCK 12X15 (MISCELLANEOUS) ×2 IMPLANT
BLADE SAG 18X100X1.27 (BLADE) ×2 IMPLANT
BLADE SAW SGTL 11.0X1.19X90.0M (BLADE) ×2 IMPLANT
BNDG ELASTIC 6X5.8 VLCR STR LF (GAUZE/BANDAGES/DRESSINGS) ×2 IMPLANT
BOWL SMART MIX CTS (DISPOSABLE) ×2 IMPLANT
CEMENT HV SMART SET (Cement) ×4 IMPLANT
CEMENT TIBIA MBT (Knees) IMPLANT
COVER SURGICAL LIGHT HANDLE (MISCELLANEOUS) ×2 IMPLANT
COVER WAND RF STERILE (DRAPES) ×1 IMPLANT
CUFF TOURN SGL QUICK 34 (TOURNIQUET CUFF) ×2
CUFF TRNQT CYL 34X4.125X (TOURNIQUET CUFF) ×1 IMPLANT
DECANTER SPIKE VIAL GLASS SM (MISCELLANEOUS) ×1 IMPLANT
DRAPE U-SHAPE 47X51 STRL (DRAPES) ×2 IMPLANT
DRSG AQUACEL AG ADV 3.5X10 (GAUZE/BANDAGES/DRESSINGS) ×2 IMPLANT
DURAPREP 26ML APPLICATOR (WOUND CARE) ×2 IMPLANT
ELECT REM PT RETURN 15FT ADLT (MISCELLANEOUS) ×2 IMPLANT
FEMUR SIGMA PS KNEE SZ 4.0N L (Femur) ×1 IMPLANT
GLOVE SRG 8 PF TXTR STRL LF DI (GLOVE) ×1 IMPLANT
GLOVE SURG ENC MOIS LTX SZ6 (GLOVE) IMPLANT
GLOVE SURG ENC MOIS LTX SZ7 (GLOVE) ×2 IMPLANT
GLOVE SURG ENC MOIS LTX SZ8 (GLOVE) ×2 IMPLANT
GLOVE SURG UNDER POLY LF SZ6.5 (GLOVE) ×2 IMPLANT
GLOVE SURG UNDER POLY LF SZ8 (GLOVE) ×2
GLOVE SURG UNDER POLY LF SZ8.5 (GLOVE) ×2 IMPLANT
GOWN STRL REUS W/TWL LRG LVL3 (GOWN DISPOSABLE) ×4 IMPLANT
GOWN STRL REUS W/TWL XL LVL3 (GOWN DISPOSABLE) ×2 IMPLANT
HANDPIECE INTERPULSE COAX TIP (DISPOSABLE) ×2
HOLDER FOLEY CATH W/STRAP (MISCELLANEOUS) ×1 IMPLANT
IMMOBILIZER KNEE 20 (SOFTGOODS) ×2
IMMOBILIZER KNEE 20 THIGH 36 (SOFTGOODS) ×1 IMPLANT
KIT TURNOVER KIT A (KITS) ×1 IMPLANT
MANIFOLD NEPTUNE II (INSTRUMENTS) ×2 IMPLANT
NS IRRIG 1000ML POUR BTL (IV SOLUTION) ×2 IMPLANT
PACK TOTAL KNEE CUSTOM (KITS) ×2 IMPLANT
PADDING CAST ABS 6INX4YD NS (CAST SUPPLIES) ×1
PADDING CAST ABS COTTON 6X4 NS (CAST SUPPLIES) IMPLANT
PADDING CAST COTTON 6X4 STRL (CAST SUPPLIES) ×4 IMPLANT
PATELLA DOME PFC 38MM (Knees) ×1 IMPLANT
PENCIL SMOKE EVACUATOR (MISCELLANEOUS) ×2 IMPLANT
PIN STEINMAN FIXATION KNEE (PIN) ×1 IMPLANT
PLATE ROT INSERT 10MM SIZE 4 (Plate) ×1 IMPLANT
PROTECTOR NERVE ULNAR (MISCELLANEOUS) ×2 IMPLANT
SET HNDPC FAN SPRY TIP SCT (DISPOSABLE) ×1 IMPLANT
STRIP CLOSURE SKIN 1/2X4 (GAUZE/BANDAGES/DRESSINGS) ×3 IMPLANT
SUT MNCRL AB 4-0 PS2 18 (SUTURE) ×2 IMPLANT
SUT STRATAFIX 0 PDS 27 VIOLET (SUTURE) ×2
SUT VIC AB 2-0 CT1 27 (SUTURE) ×6
SUT VIC AB 2-0 CT1 TAPERPNT 27 (SUTURE) ×3 IMPLANT
SUTURE STRATFX 0 PDS 27 VIOLET (SUTURE) ×1 IMPLANT
TIBIA MBT CEMENT (Knees) ×2 IMPLANT
TRAY FOLEY MTR SLVR 14FR STAT (SET/KITS/TRAYS/PACK) ×1 IMPLANT
WATER STERILE IRR 1000ML POUR (IV SOLUTION) ×4 IMPLANT
WRAP KNEE MAXI GEL POST OP (GAUZE/BANDAGES/DRESSINGS) ×2 IMPLANT

## 2021-01-19 NOTE — Anesthesia Procedure Notes (Signed)
Procedure Name: LMA Insertion Date/Time: 01/19/2021 11:14 AM Performed by: Michele Rockers, CRNA Pre-anesthesia Checklist: Patient identified, Emergency Drugs available, Suction available and Patient being monitored Patient Re-evaluated:Patient Re-evaluated prior to induction Oxygen Delivery Method: Circle system utilized Preoxygenation: Pre-oxygenation with 100% oxygen Induction Type: IV induction LMA: LMA with gastric port inserted LMA Size: 4.0 Number of attempts: 1 Placement Confirmation: positive ETCO2 and breath sounds checked- equal and bilateral Tube secured with: Tape Dental Injury: Teeth and Oropharynx as per pre-operative assessment

## 2021-01-19 NOTE — Anesthesia Procedure Notes (Signed)
Procedure Name: MAC Date/Time: 01/19/2021 10:30 AM Performed by: Michele Rockers, CRNA Pre-anesthesia Checklist: Patient identified, Emergency Drugs available, Suction available, Timeout performed and Patient being monitored Patient Re-evaluated:Patient Re-evaluated prior to induction Oxygen Delivery Method: Simple face mask

## 2021-01-19 NOTE — Anesthesia Postprocedure Evaluation (Signed)
Anesthesia Post Note  Patient: SHAMONA WIRTZ  Procedure(s) Performed: TOTAL KNEE ARTHROPLASTY (Left Knee)     Patient location during evaluation: PACU Anesthesia Type: Regional and General Level of consciousness: awake and alert Pain management: pain level controlled Vital Signs Assessment: post-procedure vital signs reviewed and stable Respiratory status: spontaneous breathing, nonlabored ventilation and respiratory function stable Cardiovascular status: blood pressure returned to baseline and stable Postop Assessment: no apparent nausea or vomiting Anesthetic complications: no   No complications documented.  Last Vitals:  Vitals:   01/19/21 1333 01/19/21 1338  BP: (!) 172/70 (!) 172/70  Pulse: (!) 58 (!) 58  Resp: 12 12  Temp: 36.7 C 36.7 C  SpO2: 100%     Last Pain:  Vitals:   01/19/21 1338  TempSrc: Axillary  PainSc:                  Lynda Rainwater

## 2021-01-19 NOTE — Progress Notes (Signed)
Orthopedic Tech Progress Note Patient Details:  Jackie Stevens [birth year] [id number]  CPM Left Knee CPM Left Knee: On Left Knee Flexion (Degrees): 40 Left Knee Extension (Degrees): 0  Post Interventions Patient Tolerated: Well Instructions Provided: Care of device     Post Interventions Patient Tolerated: Well Instructions Provided: Care of device   Braulio Bosch 01/19/2021, 1:45 PM

## 2021-01-19 NOTE — Plan of Care (Signed)
Patient admitted to unit all careplans initiated  

## 2021-01-19 NOTE — Anesthesia Procedure Notes (Signed)
Anesthesia Regional Block: Adductor canal block   Pre-Anesthetic Checklist: ,, timeout performed, Correct Patient, Correct Site, Correct Laterality, Correct Procedure, Correct Position, site marked, Risks and benefits discussed,  Surgical consent,  Pre-op evaluation,  At surgeon's request and post-op pain management  Laterality: Left  Prep: chloraprep       Needles:  Injection technique: Single-shot  Needle Type: Stimiplex     Needle Length: 9cm  Needle Gauge: 21     Additional Needles:   Procedures:,,,, ultrasound used (permanent image in chart),,,,  Narrative:  Start time: 01/19/2021 9:52 AM End time: 01/19/2021 9:57 AM Injection made incrementally with aspirations every 5 mL.  Performed by: Personally  Anesthesiologist: Lynda Rainwater, MD

## 2021-01-19 NOTE — Evaluation (Signed)
Physical Therapy Evaluation Patient Details Name: Jackie Stevens MRN: [id number] DOB: February 19, 1946 Today's Date: 01/19/2021   History of Present Illness  s/p L TKA. PMH: mild neurocognitive d/o, OA, vertigo, obesity  Clinical Impression  Pt is s/p TKA resulting in the deficits listed below (see PT Problem List).  amb ~ 4' pivotal steps bed to chair with min assist. Anticipate steady progress in acute setting.   Pt will benefit from skilled PT to increase their independence and safety with mobility to allow discharge to the venue listed below.      Follow Up Recommendations Follow surgeon's recommendation for DC plan and follow-up therapies    Equipment Recommendations  Rolling walker with 5" wheels (youth ht)    Recommendations for Other Services       Precautions / Restrictions Precautions Precautions: Fall;Knee Required Braces or Orthoses: Knee Immobilizer - Left Knee Immobilizer - Left: Discontinue once straight leg raise with < 10 degree lag Restrictions Weight Bearing Restrictions: No LLE Weight Bearing: Weight bearing as tolerated Other Position/Activity Restrictions: WBAT      Mobility  Bed Mobility Overal bed mobility: Needs Assistance Bed Mobility: Supine to Sit     Supine to sit: Min assist     General bed mobility comments: assist with LLE, incr time    Transfers Overall transfer level: Needs assistance Equipment used: Rolling walker (2 wheeled) Transfers: Sit to/from [facility] Sit to Stand: Min assist Stand pivot transfers: Min assist       General transfer comment: cues for hand placement and LLE management  Ambulation/Gait Ambulation/Gait assistance: Min assist Gait Distance (Feet): 4 Feet Assistive device: Rolling walker (2 wheeled) Gait Pattern/deviations: Step-to pattern;Decreased stance time - left     General Gait Details: cues for sequence, assist to balance and maneuver with pivotal steps to chair (lunch arrived,  further amb deferred)  Stairs            Wheelchair Mobility    Modified Rankin (Stroke Patients Only)       Balance                                             Pertinent Vitals/Pain Pain Assessment: 0-10 Pain Score: 5  Pain Location: left knee Pain Descriptors / Indicators: Grimacing;Sore Pain Intervention(s): Premedicated before session;Monitored during session;Limited activity within patient's tolerance;Repositioned    Home Living Family/patient expects to be discharged to:: Private residence Living Arrangements: Spouse/significant other   Type of Home: House Home Access: Other (comment) (stair lift in garage)     Home Layout: One level Home Equipment: Walker - 4 wheels      Prior Function Level of Independence: Independent with assistive device(s)         Comments: amb with rollator prior to admission     Hand Dominance        Extremity/Trunk Assessment   Upper Extremity Assessment Upper Extremity Assessment: Overall WFL for tasks assessed    Lower Extremity Assessment Lower Extremity Assessment: LLE deficits/detail LLE Deficits / Details: ankle WFL, kne extension and hip flexion 2/5, limited by post op pain and weakness       Communication   Communication: No difficulties  Cognition Arousal/Alertness: Awake/alert Behavior During Therapy: WFL for tasks assessed/performed  General Comments: hx neurocognitive d/o      General Comments      Exercises Total Joint Exercises Ankle Circles/Pumps: AROM;Both;5 reps   Assessment/Plan    PT Assessment Patient needs continued PT services  PT Problem List Decreased strength;Decreased mobility;Decreased range of motion;Decreased activity tolerance;Decreased knowledge of use of DME;Pain       PT Treatment Interventions DME instruction;Therapeutic activities;Gait training;Therapeutic exercise;Patient/family  education;Functional mobility training;Stair training    PT Goals (Current goals can be found in the Care Plan section)  Acute Rehab PT Goals Patient Stated Goal: less pain with walking PT Goal Formulation: With patient Time For Goal Achievement: 01/26/21 Potential to Achieve Goals: Good    Frequency 7X/week   Barriers to discharge        Co-evaluation               AM-PAC PT "6 Clicks" Mobility  Outcome Measure Help needed turning from your back to your side while in a flat bed without using bedrails?: A Little Help needed moving from lying on your back to sitting on the side of a flat bed without using bedrails?: A Little Help needed moving to and from a bed to a chair (including a wheelchair)?: A Little Help needed standing up from a chair using your arms (e.g., wheelchair or bedside chair)?: A Little Help needed to walk in hospital room?: A Little Help needed climbing 3-5 steps with a railing? : A Lot 6 Click Score: 17    End of Session Equipment Utilized During Treatment: Gait belt;Left knee immobilizer Activity Tolerance: Patient tolerated treatment well Patient left: in chair;with call bell/phone within reach;with chair alarm set;with family/visitor present Nurse Communication: Mobility status PT Visit Diagnosis: Other abnormalities of gait and mobility (R26.89)    Time: 7672-0947 PT Time Calculation (min) (ACUTE ONLY): 34 min   Charges:   PT Evaluation $PT Eval Low Complexity: 1 Low PT Treatments $Gait Training: 8-22 mins        Baxter Flattery, PT  Acute Rehab Dept ([place]) [phone] Pager [phone]  01/19/2021   [facility] 01/19/2021, 3:40 PM

## 2021-01-19 NOTE — Interval H&P Note (Signed)
History and Physical Interval Note:  01/19/2021 8:27 AM  Jackie Stevens  has presented today for surgery, with the diagnosis of Left knee osteoarthritis.  The various methods of treatment have been discussed with the patient and family. After consideration of risks, benefits and other options for treatment, the patient has consented to  Procedure(s) with comments: TOTAL KNEE ARTHROPLASTY (Left) - 74min as a surgical intervention.  The patient's history has been reviewed, patient examined, no change in status, stable for surgery.  I have reviewed the patient's chart and labs.  Questions were answered to the patient's satisfaction.     Pilar Plate Jael Waldorf

## 2021-01-19 NOTE — Discharge Instructions (Addendum)
 Frank Aluisio, MD Total Joint Specialist [facility] [place] [address] [place], [place] [address] [phone]  TOTAL KNEE REPLACEMENT POSTOPERATIVE DIRECTIONS    Knee Rehabilitation, Guidelines Following Surgery  Results after knee surgery are often greatly improved when you follow the exercise, range of motion and muscle strengthening exercises prescribed by your doctor. Safety measures are also important to protect the knee from further injury. If any of these exercises cause you to have increased pain or swelling in your knee joint, decrease the amount until you are comfortable again and slowly increase them. If you have problems or questions, call your caregiver or physical therapist for advice.   BLOOD CLOT PREVENTION . Take a 10 mg Xarelto once a day for three weeks following surgery. Then take an 81 mg Aspirin once a day for three weeks. Then discontinue Aspirin. . You may resume your vitamins/supplements once you have discontinued the Xarelto. . Do not take any NSAIDs (Advil, Aleve, Ibuprofen, Meloxicam, etc.) until you have discontinued the Xarelto.   HOME CARE INSTRUCTIONS  . Remove items at home which could result in a fall. This includes throw rugs or furniture in walking pathways.  . ICE to the affected knee as much as tolerated. Icing helps control swelling. If the swelling is well controlled you will be more comfortable and rehab easier. Continue to use ice on the knee for pain and swelling from surgery. You may notice swelling that will progress down to the foot and ankle. This is normal after surgery. Elevate the leg when you are not up walking on it.    . Continue to use the breathing machine which will help keep your temperature down. It is common for your temperature to cycle up and down following surgery, especially at night when you are not up moving around and exerting yourself. The breathing machine keeps your lungs expanded and your  temperature down. . Do not place pillow under the operative knee, focus on keeping the knee straight while resting  DIET You may resume your previous home diet once you are discharged from the hospital.  DRESSING / WOUND CARE / SHOWERING . Keep your bulky bandage on for 2 days. On the third post-operative day you may remove the Ace bandage and gauze. There is a waterproof adhesive bandage on your skin which will stay in place until your first follow-up appointment. Once you remove this you will not need to place another bandage . You may begin showering 3 days following surgery, but do not submerge the incision under water.  ACTIVITY For the first 5 days, the key is rest and control of pain and swelling . Do your home exercises twice a day starting on post-operative day 3. On the days you go to physical therapy, just do the home exercises once that day. . You should rest, ice and elevate the leg for 50 minutes out of every hour. Get up and walk/stretch for 10 minutes per hour. After 5 days you can increase your activity slowly as tolerated. . Walk with your walker as instructed. Use the walker until you are comfortable transitioning to a cane. Walk with the cane in the opposite hand of the operative leg. You may discontinue the cane once you are comfortable and walking steadily. . Avoid periods of inactivity such as sitting longer than an hour when not asleep. This helps prevent blood clots.  . You may discontinue the knee immobilizer once you are able to perform a straight leg   raise while lying down. . You may resume a sexual relationship in one month or when given the OK by your doctor.  . You may return to work once you are cleared by your doctor.  . Do not drive a car for 6 weeks or until released by your surgeon.  . Do not drive while taking narcotics.  TED HOSE STOCKINGS Wear the elastic stockings on both legs for three weeks following surgery during the day. You may remove them at night  for sleeping.  WEIGHT BEARING Weight bearing as tolerated with assist device (walker, cane, etc) as directed, use it as long as suggested by your surgeon or therapist, typically at least 4-6 weeks.  POSTOPERATIVE CONSTIPATION PROTOCOL Constipation - defined medically as fewer than three stools per week and severe constipation as less than one stool per week.  One of the most common issues patients have following surgery is constipation.  Even if you have a regular bowel pattern at home, your normal regimen is likely to be disrupted due to multiple reasons following surgery.  Combination of anesthesia, postoperative narcotics, change in appetite and fluid intake all can affect your bowels.  In order to avoid complications following surgery, here are some recommendations in order to help you during your recovery period.  . Colace (docusate) - Pick up an over-the-counter form of Colace or another stool softener and take twice a day as long as you are requiring postoperative pain medications.  Take with a full glass of water daily.  If you experience loose stools or diarrhea, hold the colace until you stool forms back up. If your symptoms do not get better within 1 week or if they get worse, check with your doctor. . Dulcolax (bisacodyl) - Pick up over-the-counter and take as directed by the product packaging as needed to assist with the movement of your bowels.  Take with a full glass of water.  Use this product as needed if not relieved by Colace only.  . MiraLax (polyethylene glycol) - Pick up over-the-counter to have on hand. MiraLax is a solution that will increase the amount of water in your bowels to assist with bowel movements.  Take as directed and can mix with a glass of water, juice, soda, coffee, or tea. Take if you go more than two days without a movement. Do not use MiraLax more than once per day. Call your doctor if you are still constipated or irregular after using this medication for 7 days  in a row.  If you continue to have problems with postoperative constipation, please contact the office for further assistance and recommendations.  If you experience "the worst abdominal pain ever" or develop nausea or vomiting, please contact the office immediatly for further recommendations for treatment.  ITCHING If you experience itching with your medications, try taking only a single pain pill, or even half a pain pill at a time.  You can also use Benadryl over the counter for itching or also to help with sleep.   MEDICATIONS See your medication summary on the "After Visit Summary" that the nursing staff will review with you prior to discharge.  You may have some home medications which will be placed on hold until you complete the course of blood thinner medication.  It is important for you to complete the blood thinner medication as prescribed by your surgeon.  Continue your approved medications as instructed at time of discharge.  PRECAUTIONS . If you experience chest pain or shortness of breath -   call 911 immediately for transfer to the hospital emergency department.  . If you develop a fever greater that 101 F, purulent drainage from wound, increased redness or drainage from wound, foul odor from the wound/dressing, or calf pain - CONTACT YOUR SURGEON.                                                   FOLLOW-UP APPOINTMENTS Make sure you keep all of your appointments after your operation with your surgeon and caregivers. You should call the office at the above phone number and make an appointment for approximately two weeks after the date of your surgery or on the date instructed by your surgeon outlined in the "After Visit Summary".  RANGE OF MOTION AND STRENGTHENING EXERCISES  Rehabilitation of the knee is important following a knee injury or an operation. After just a few days of immobilization, the muscles of the thigh which control the knee become weakened and shrink (atrophy). Knee  exercises are designed to build up the tone and strength of the thigh muscles and to improve knee motion. Often times heat used for twenty to thirty minutes before working out will loosen up your tissues and help with improving the range of motion but do not use heat for the first two weeks following surgery. These exercises can be done on a training (exercise) mat, on the floor, on a table or on a bed. Use what ever works the best and is most comfortable for you Knee exercises include:  . Leg Lifts - While your knee is still immobilized in a splint or cast, you can do straight leg raises. Lift the leg to 60 degrees, hold for 3 sec, and slowly lower the leg. Repeat 10-20 times 2-3 times daily. Perform this exercise against resistance later as your knee gets better.  . Quad and Hamstring Sets - Tighten up the muscle on the front of the thigh (Quad) and hold for 5-10 sec. Repeat this 10-20 times hourly. Hamstring sets are done by pushing the foot backward against an object and holding for 5-10 sec. Repeat as with quad sets.   Leg Slides: Lying on your back, slowly slide your foot toward your buttocks, bending your knee up off the floor (only go as far as is comfortable). Then slowly slide your foot back down until your leg is flat on the floor again.  Angel Wings: Lying on your back spread your legs to the side as far apart as you can without causing discomfort.  A rehabilitation program following serious knee injuries can speed recovery and prevent re-injury in the future due to weakened muscles. Contact your doctor or a physical therapist for more information on knee rehabilitation.   IF YOU ARE TRANSFERRED TO A SKILLED REHAB FACILITY If the patient is transferred to a skilled rehab facility following release from the hospital, a list of the current medications will be sent to the facility for the patient to continue.  When discharged from the skilled rehab facility, please have the facility set up the  patient's Home Health Physical Therapy prior to being released. Also, the skilled facility will be responsible for providing the patient with their medications at time of release from the facility to include their pain medication, the muscle relaxants, and their blood thinner medication. If the patient is still at the rehab facility   at time of the two week follow up appointment, the skilled rehab facility will also need to assist the patient in arranging follow up appointment in our office and any transportation needs.  MAKE SURE YOU:  . Understand these instructions.  . Get help right away if you are not doing well or get worse.   DENTAL ANTIBIOTICS:  In most cases prophylactic antibiotics for Dental procdeures after total joint surgery are not necessary.  Exceptions are as follows:  1. History of prior total joint infection  2. Severely immunocompromised (Organ Transplant, cancer chemotherapy, Rheumatoid biologic meds such as Humera)  3. Poorly controlled diabetes (A1C &gt; 8.0, blood glucose over 200)  If you have one of these conditions, contact your surgeon for an antibiotic prescription, prior to your dental procedure.    Pick up stool softner and laxative for home use following surgery while on pain medications. Do not submerge incision under water. Please use good hand washing techniques while changing dressing each day. May shower starting three days after surgery. Please use a clean towel to pat the incision dry following showers. Continue to use ice for pain and swelling after surgery. Do not use any lotions or creams on the incision until instructed by your surgeon.    Information on my medicine - XARELTO (Rivaroxaban)   Why was Xarelto prescribed for you? Xarelto was prescribed for you to reduce the risk of blood clots forming after orthopedic surgery. The medical term for these abnormal blood clots is venous thromboembolism (VTE).  What do you need to know  about xarelto ? Take your Xarelto ONCE DAILY at the same time every day. You may take it either with or without food.  If you have difficulty swallowing the tablet whole, you may crush it and mix in applesauce just prior to taking your dose.  Take Xarelto exactly as prescribed by your doctor and DO NOT stop taking Xarelto without talking to the doctor who prescribed the medication.  Stopping without other VTE prevention medication to take the place of Xarelto may increase your risk of developing a clot.  After discharge, you should have regular check-up appointments with your healthcare provider that is prescribing your Xarelto.    What do you do if you miss a dose? If you miss a dose, take it as soon as you remember on the same day then continue your regularly scheduled once daily regimen the next day. Do not take two doses of Xarelto on the same day.   Important Safety Information A possible side effect of Xarelto is bleeding. You should call your healthcare provider right away if you experience any of the following: ? Bleeding from an injury or your nose that does not stop. ? Unusual colored urine (red or dark brown) or unusual colored stools (red or black). ? Unusual bruising for unknown reasons. ? A serious fall or if you hit your head (even if there is no bleeding).  Some medicines may interact with Xarelto and might increase your risk of bleeding while on Xarelto. To help avoid this, consult your healthcare provider or pharmacist prior to using any new prescription or non-prescription medications, including herbals, vitamins, non-steroidal anti-inflammatory drugs (NSAIDs) and supplements.  This website has more information on Xarelto: www.xarelto.com.   

## 2021-01-19 NOTE — Anesthesia Procedure Notes (Signed)
Spinal  Patient location during procedure: OR Start time: 01/19/2021 10:36 AM End time: 01/19/2021 10:41 AM Staffing Performed: anesthesiologist  Anesthesiologist: Lynda Rainwater, MD Preanesthetic Checklist Completed: patient identified, IV checked, site marked, risks and benefits discussed, surgical consent, monitors and equipment checked, pre-op evaluation and timeout performed Spinal Block Patient position: sitting Prep: DuraPrep Patient monitoring: heart rate, cardiac monitor, continuous pulse ox and blood pressure Approach: midline Location: L3-4 Injection technique: single-shot Needle Needle type: Quincke  Needle gauge: 22 G Needle length: 9 cm Assessment Sensory level: T4

## 2021-01-19 NOTE — Care Plan (Signed)
Ortho Bundle Case Management Note  Patient Details  Name: MORINE KOHLMAN MRN: [id number] Date of Birth: 1946-09-28  L TKA on 01-19-21 DCP:  Home with husband and son.  1 story home with w/c lift entrance. DME:  RW ordered through [place].  PT:  HHC through [facility].  OP PT scheduled at [facility] in [place] on 02-04-21.                   DME Arranged:  [place]ford Rile rolling DME Agency:  [facility]  HH Arranged:  PT [place] Agency:  [facility] (formerly [facility])  Additional Comments: Please contact me with any questions of if this plan should need to change.  Marianne Sofia, RN,CCM [facility]  [phone] 01/19/2021, 10:46 AM

## 2021-01-19 NOTE — Transfer of Care (Signed)
Immediate Anesthesia Transfer of Care Note  Patient: Jackie Stevens  Procedure(s) Performed: TOTAL KNEE ARTHROPLASTY (Left Knee)  Patient Location: PACU  Anesthesia Type:General  Level of Consciousness: drowsy, patient cooperative and responds to stimulation  Airway & Oxygen Therapy: Patient Spontanous Breathing and Patient connected to face mask oxygen  Post-op Assessment: Report given to RN and Post -op Vital signs reviewed and stable  Post vital signs: Reviewed and stable  Last Vitals:  Vitals Value Taken Time  BP 158/67 01/19/21 1221  Temp    Pulse 50 01/19/21 1223  Resp 14 01/19/21 1223  SpO2 100 % 01/19/21 1223  Vitals shown include unvalidated device data.  Last Pain:  Vitals:   01/19/21 0803  TempSrc: Oral         Complications: No complications documented.

## 2021-01-19 NOTE — Op Note (Signed)
OPERATIVE REPORT-TOTAL KNEE ARTHROPLASTY   Pre-operative diagnosis- Osteoarthritis  Left knee(s)  Post-operative diagnosis- Osteoarthritis Left knee(s)  Procedure-  Left  Total Knee Arthroplasty  Surgeon- Dione Plover. Shealynn Saulnier, MD  Assistant- Theresa Duty, PA-C   Anesthesia-  GA combined with regional for post-op pain  EBL-200 mL   Drains None  Tourniquet time-  Total Tourniquet Time Documented: Thigh (Left) - 8 minutes Total: Thigh (Left) - 8 minutes     Complications- None  Condition-PACU - hemodynamically stable.   Brief Clinical Note  Jackie Stevens is a 75 y.o. year old female with end stage OA of her left knee with progressively worsening pain and dysfunction. She has constant pain, with activity and at rest and significant functional deficits with difficulties even with ADLs. She has had extensive non-op management including analgesics, injections of cortisone and viscosupplements, and home exercise program, but remains in significant pain with significant dysfunction. Radiographs show bone on bone arthritis all 3 compartments. She presents now for left Total Knee Arthroplasty.    Procedure in detail---   The patient is brought into the operating room and positioned supine on the operating table. After successful administration of  GA combined with regional for post-op pain,   a tourniquet is placed high on the  Left thigh(s) and the lower extremity is prepped and draped in the usual sterile fashion. Time out is performed by the operating team and then the  Left lower extremity is wrapped in Esmarch, knee flexed and the tourniquet inflated to 300 mmHg.       A midline incision is made with a ten blade through the subcutaneous tissue to the level of the extensor mechanism. A fresh blade is used to make a medial parapatellar arthrotomy. Soft tissue over the proximal medial tibia is subperiosteally elevated to the joint line with a knife and into the semimembranosus bursa  with a Cobb elevator. Soft tissue over the proximal lateral tibia is elevated with attention being paid to avoiding the patellar tendon on the tibial tubercle. The patella is everted, knee flexed 90 degrees and the ACL and PCL are removed. Findings are bone on bone all 3 compartments with massive global osteophytes.  The tourniquet was not functioning well and was deflated after 8 minutes.      The drill is used to create a starting hole in the distal femur and the canal is thoroughly irrigated with sterile saline to remove the fatty contents. The 5 degree Left  valgus alignment guide is placed into the femoral canal and the distal femoral cutting block is pinned to remove 10 mm off the distal femur. Resection is made with an oscillating saw.      The tibia is subluxed forward and the menisci are removed. The extramedullary alignment guide is placed referencing proximally at the medial aspect of the tibial tubercle and distally along the second metatarsal axis and tibial crest. The block is pinned to remove 82mm off the more deficient medial  side. Resection is made with an oscillating saw. Size 3is the most appropriate size for the tibia and the proximal tibia is prepared with the modular drill and keel punch for that size.      The femoral sizing guide is placed and size 4 is most appropriate. Rotation is marked off the epicondylar axis and confirmed by creating a rectangular flexion gap at 90 degrees. The size 4 cutting block is pinned in this rotation and the anterior, posterior and chamfer cuts are made with  the oscillating saw. The intercondylar block is then placed and that cut is made.      Trial size 3 tibial component, trial size 4 narrow posterior stabilized femur and a 10  mm posterior stabilized rotating platform insert trial is placed. Full extension is achieved with excellent varus/valgus and anterior/posterior balance throughout full range of motion. The patella is everted and thickness measured  to be 22  mm. Free hand resection is taken to 12 mm, a 38 template is placed, lug holes are drilled, trial patella is placed, and it tracks normally. Osteophytes are removed off the posterior femur with the trial in place. All trials are removed and the cut bone surfaces prepared with pulsatile lavage. Cement is mixed and once ready for implantation, the size 3 tibial implant, size  4 narrow posterior stabilized femoral component, and the size 38 patella are cemented in place and the patella is held with the clamp. The trial insert is placed and the knee held in full extension. The Exparel (20 ml mixed with 60 ml saline) is injected into the extensor mechanism, posterior capsule, medial and lateral gutters and subcutaneous tissues.  All extruded cement is removed and once the cement is hard the permanent 10 mm posterior stabilized rotating platform insert is placed into the tibial tray.      The wound is copiously irrigated with saline solution and the extensor mechanism closed with # 0 Stratofix suture.. Flexion against gravity is 140 degrees and the patella tracks normally. Subcutaneous tissue is closed with 2.0 vicryl and subcuticular with running 4.0 Monocryl. The incision is cleaned and dried and steri-strips and a bulky sterile dressing are applied. The limb is placed into a knee immobilizer and the patient is awakened and transported to recovery in stable condition.      Please note that a surgical assistant was a medical necessity for this procedure in order to perform it in a safe and expeditious manner. Surgical assistant was necessary to retract the ligaments and vital neurovascular structures to prevent injury to them and also necessary for proper positioning of the limb to allow for anatomic placement of the prosthesis.   Dione Plover Bluma Buresh, MD    01/19/2021, 11:52 AM

## 2021-01-19 NOTE — Progress Notes (Signed)
Assisted Dr. Miller with left, ultrasound guided, adductor canal block. Side rails up, monitors on throughout procedure. See vital signs in flow sheet. Tolerated Procedure well.  

## 2021-01-20 ENCOUNTER — Encounter (HOSPITAL_COMMUNITY): Payer: Self-pay | Admitting: Orthopedic Surgery

## 2021-01-20 ENCOUNTER — Encounter: Payer: Medicare HMO | Admitting: Psychology

## 2021-01-20 DIAGNOSIS — E039 Hypothyroidism, unspecified: Secondary | ICD-10-CM | POA: Diagnosis not present

## 2021-01-20 DIAGNOSIS — Z79899 Other long term (current) drug therapy: Secondary | ICD-10-CM | POA: Diagnosis not present

## 2021-01-20 DIAGNOSIS — J4520 Mild intermittent asthma, uncomplicated: Secondary | ICD-10-CM | POA: Diagnosis not present

## 2021-01-20 DIAGNOSIS — M1712 Unilateral primary osteoarthritis, left knee: Secondary | ICD-10-CM | POA: Diagnosis not present

## 2021-01-20 DIAGNOSIS — I10 Essential (primary) hypertension: Secondary | ICD-10-CM | POA: Diagnosis not present

## 2021-01-20 DIAGNOSIS — Z85850 Personal history of malignant neoplasm of thyroid: Secondary | ICD-10-CM | POA: Diagnosis not present

## 2021-01-20 DIAGNOSIS — Z96652 Presence of left artificial knee joint: Secondary | ICD-10-CM | POA: Diagnosis not present

## 2021-01-20 DIAGNOSIS — G4733 Obstructive sleep apnea (adult) (pediatric): Secondary | ICD-10-CM | POA: Diagnosis not present

## 2021-01-20 LAB — CBC
HCT: 32.8 % — ABNORMAL LOW (ref 36.0–46.0)
Hemoglobin: 10.5 g/dL — ABNORMAL LOW (ref 12.0–15.0)
MCH: 29.2 pg (ref 26.0–34.0)
MCHC: 32 g/dL (ref 30.0–36.0)
MCV: 91.4 fL (ref 80.0–100.0)
Platelets: 233 10*3/uL (ref 150–400)
RBC: 3.59 MIL/uL — ABNORMAL LOW (ref 3.87–5.11)
RDW: 12.6 % (ref 11.5–15.5)
WBC: 14.3 10*3/uL — ABNORMAL HIGH (ref 4.0–10.5)
nRBC: 0 % (ref 0.0–0.2)

## 2021-01-20 LAB — BASIC METABOLIC PANEL
Anion gap: 8 (ref 5–15)
BUN: 18 mg/dL (ref 8–23)
CO2: 27 mmol/L (ref 22–32)
Calcium: 8.5 mg/dL — ABNORMAL LOW (ref 8.9–10.3)
Chloride: 99 mmol/L (ref 98–111)
Creatinine, Ser: 0.65 mg/dL (ref 0.44–1.00)
GFR, Estimated: >60 mL/min (ref >60)
Glucose, Bld: 141 mg/dL — ABNORMAL HIGH (ref 70–99)
Potassium: 4.6 mmol/L (ref 3.5–5.1)
Sodium: 134 mmol/L — ABNORMAL LOW (ref 135–145)

## 2021-01-20 MED ORDER — RIVAROXABAN 10 MG PO TABS: 10.0000 mg | ORAL_TABLET | Freq: Every day | ORAL | 0 refills | Status: DC

## 2021-01-20 MED ORDER — METHOCARBAMOL 500 MG PO TABS: 500.0000 mg | ORAL_TABLET | Freq: Four times a day (QID) | ORAL | 0 refills | Status: DC | PRN

## 2021-01-20 MED ORDER — GABAPENTIN 100 MG PO CAPS: ORAL_CAPSULE | ORAL | 0 refills | Status: DC

## 2021-01-20 MED ORDER — OXYCODONE HCL 5 MG PO TABS: 5.0000 mg | ORAL_TABLET | Freq: Four times a day (QID) | ORAL | 0 refills | Status: DC | PRN

## 2021-01-20 MED ORDER — TRAMADOL HCL 50 MG PO TABS: 50.0000 mg | ORAL_TABLET | Freq: Four times a day (QID) | ORAL | 0 refills | Status: DC | PRN

## 2021-01-20 NOTE — Progress Notes (Signed)
Subjective: 1 Day Post-Op Procedure(s) (LRB): TOTAL KNEE ARTHROPLASTY (Left) Patient reports pain as mild.   Patient seen in rounds by Dr. Wynelle Link. Patient is well, and has had no acute complaints or problems. Denies SOB, chest pain, or calf pain. Foley to be pulled this AM. Was seen by PT yesterday -- was able to ambulate around four feet from bed to chair with minimal assist.  We will continue therapy today.   Objective: Vital signs in last 24 hours: Temp:  [96.5 F (35.8 C)-98.6 F (37 C)] 98.6 F (37 C) (02/08 0532) Pulse Rate:  [50-71] 58 (02/08 0532) Resp:  [10-17] 16 (02/08 0532) BP: (138-180)/(55-95) 145/55 (02/08 0532) SpO2:  [99 %-100 %] 100 % (02/08 0532) Weight:  [91.6 kg] 91.6 kg (02/07 1338)  Intake/Output from previous day:  Intake/Output Summary (Last 24 hours) at 01/20/2021 0728 Last data filed at 01/20/2021 0932 Gross per 24 hour  Intake 3329.2 ml  Output 2400 ml  Net 929.2 ml     Intake/Output this shift: No intake/output data recorded.  Labs: Recent Labs    01/20/21 0308  HGB 10.5*   Recent Labs    01/20/21 0308  WBC 14.3*  RBC 3.59*  HCT 32.8*  PLT 233   Recent Labs    01/20/21 0308  NA 134*  K 4.6  CL 99  CO2 27  BUN 18  CREATININE 0.65  GLUCOSE 141*  CALCIUM 8.5*   No results for input(s): LABPT, INR in the last 72 hours.  Exam: General - Patient is Alert, Appropriate and Oriented Extremity - Neurologically intact Neurovascular intact Sensation intact distally Intact pulses distally Dressing - dressing C/D/I Motor Function - intact, moving foot and toes well on exam.   Past Medical History:  Diagnosis Date  . Abnormal cervical cytology 10/25/2012   Follows with Dr Leavy Cella of Gyn  . Allergic rhinitis 11/04/2010   Qualifier: Diagnosis of  By: Annamaria Boots MD, Clinton D   . Anemia 10/06/2013  . Anginal pain    pt has history of CP states had cardiac workup with no specific issues identified   . Arthritis    knees; right  thumb; shoulders  . 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  Loss of glenohumeral joint space.  Well-maintained subacromial space.  Injected 09/30/2016 [name] .  Marland Kitchen Asthma, mild intermittent 04/02/2013  . Chicken pox as a child  . Colonic diverticular abscess 11/21/2012  . Constipation   . Decreased hearing   . Dermatitis 03/06/2017  . Diverticulitis 10/25/2012   pt. reports that a drain was placed - 09/2012    . Diverticulitis of rectosigmoid 11/28/2012  . Dry mouth   . Essential hypertension 11/08/2010   Qualifier: Diagnosis of  By: Annamaria Boots MD, Clinton D   . Excessive thirst   . External hemorrhoid, bleeding    "sometimes" (January 18, 2013)  . Frequent urination   . GERD (gastroesophageal reflux disease)   . H/O hiatal hernia   . Hand tingling   . Heart murmur   . History of kidney stones   . Hyperglycemia 01/17/2020  . Hyperlipidemia   . Hypothyroidism   . Incontinence   . Insomnia   . Kidney stones 1970's   "passed on their own" (18-Jan-2013)  . Low back pain 06/09/2014  . Measles as a child  . 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 Obesity 09/11/2017  .  Obstructive sleep apnea 11/04/2010   NPSG [facility] 07/15/10- AHI 13.5/ hr CPAP 10/APS    . Otitis media 01/07/2014  . Palpitations   . Paresthesia 03/23/2015   Left face  . PONV (postoperative nausea and vomiting)   . Rheumatoid arteritis   . Shortness of breath dyspnea    using stairs  . Sinus pain   . Swelling of both lower extremities   . Thyroid cancer 1980's  . Tinnitus   . UTI (urinary tract infection) 04/02/2013  . Vertigo 08/05/2019  . Vitamin D deficiency 02/28/2018    Assessment/Plan: 1 Day Post-Op Procedure(s) (LRB): TOTAL KNEE ARTHROPLASTY (Left) Principal Problem:   Osteoarthritis of left knee Active Problems:   Primary osteoarthritis of left knee  Estimated body mass index  is 36.95 kg/m as calculated from the following:   Height as of this encounter: 5\' 2"  (1.575 m).   Weight as of this encounter: 91.6 kg. Up with therapy   Patient's anticipated LOS is less than 2 midnights, meeting these requirements: - Lives within 1 hour of care - Has a competent adult at home to recover with post-op recover - NO history of  - Chronic pain requiring opiods  - Diabetes  - Coronary Artery Disease  - Heart failure  - Heart attack  - Stroke  - DVT/VTE  - Cardiac arrhythmia  - Respiratory Failure/COPD  - Renal failure  - Anemia  - Advanced Liver disease   DVT Prophylaxis - Xarelto Weight bearing as tolerated. To be seen by physical therapy twice today, and if meeting goals, will plan for possible discharge today.   Plan is to go Home after hospital stay.  Patient to follow up in clinic with Dr. Wynelle Link.   Fenton Foy, [place], PA-C Orthopedic Surgery [phone] 01/20/2021, 7:28 AM

## 2021-01-20 NOTE — TOC Transition Note (Signed)
Transition of Care [facility]) - CM/SW Discharge Note   Patient Details  Name: Jackie Stevens MRN: [id number] Date of Birth: December 26, 1945  [facility] CM/SW Contact:  Lennart Pall, LCSW Phone Number: 01/20/2021, 11:36 AM   Clinical Narrative:     Met briefly with pt who is Ortho "bundle" patient.  Confirming with her that [facility] is set up for [place].  Needs rw which has already been ordered via [facility].  No further TOC needs.        Patient Goals and CMS Choice        Discharge Placement                       Discharge Plan and Services                DME Arranged: Walker rolling DME Agency: [employer]       HH Arranged: PT [place] Agency: [facility] (formerly [employer])        Social Determinants of Health ([place]) Interventions     Readmission Risk Interventions No flowsheet data found.

## 2021-01-20 NOTE — Progress Notes (Signed)
Physical Therapy Treatment Patient Details Name: Jackie Stevens MRN: [id number] DOB: [birth year] Today's Date: 01/20/2021    History of Present Illness s/p L TKA. PMH: mild neurocognitive d/o, OA, vertigo, obesity    PT Comments    Pt is progressing very well this am. Will see for a second session and pt should be ready for d/c later today    Follow Up Recommendations  Follow surgeon's recommendation for DC plan and follow-up therapies     Equipment Recommendations  Rolling walker with 5" wheels    Recommendations for Other Services       Precautions / Restrictions Precautions Precautions: Fall;Knee Precaution Comments: IND SLRs Required Braces or Orthoses: Knee Immobilizer - Left Knee Immobilizer - Left: Discontinue once straight leg raise with < 10 degree lag Restrictions Weight Bearing Restrictions: No Other Position/Activity Restrictions: WBAT    Mobility  Bed Mobility               General bed mobility comments: in chair on arrival  Transfers Overall transfer level: Needs assistance Equipment used: Rolling walker (2 wheeled) Transfers: Sit to/from Stand Sit to Stand: Min guard         General transfer comment: cues for hand placement and LLE management  Ambulation/Gait Ambulation/Gait assistance: Min guard Gait Distance (Feet): 160 Feet Assistive device: Rolling walker (2 wheeled) Gait Pattern/deviations: Step-to pattern;Decreased stance time - left     General Gait Details: cues for sequence and RW position   Stairs             Wheelchair Mobility    Modified Rankin (Stroke Patients Only)       Balance                                            Cognition Arousal/Alertness: Awake/alert Behavior During Therapy: WFL for tasks assessed/performed Overall Cognitive Status: Within Functional Limits for tasks assessed                                 General Comments: hx neurocognitive d/o       Exercises Total Joint Exercises Ankle Circles/Pumps: AROM;Both;10 reps Quad Sets: AROM;Left;10 reps Heel Slides: AAROM;15 reps;Left Hip ABduction/ADduction: AROM;Left;10 reps Straight Leg Raises: AROM;Left;10 reps Goniometric ROM: grossly 3 to 65 degrees AAROM L knee flexion    General Comments        Pertinent Vitals/Pain Pain Assessment: 0-10 Pain Location: left knee Pain Descriptors / Indicators: Grimacing;Sore Pain Intervention(s): Monitored during session;Limited activity within patient's tolerance;Repositioned;Premedicated before session;Ice applied    Home Living                      Prior Function            PT Goals (current goals can now be found in the care plan section) Acute Rehab PT Goals Patient Stated Goal: less pain with walking PT Goal Formulation: With patient Time For Goal Achievement: 01/26/21 Potential to Achieve Goals: Good Progress towards PT goals: Progressing toward goals    Frequency    7X/week      PT Plan Current plan remains appropriate    Co-evaluation              AM-PAC PT "6 Clicks" Mobility   Outcome Measure  Help needed turning from your back  to your side while in a flat bed without using bedrails?: A Little Help needed moving from lying on your back to sitting on the side of a flat bed without using bedrails?: A Little Help needed moving to and from a bed to a chair (including a wheelchair)?: A Little Help needed standing up from a chair using your arms (e.g., wheelchair or bedside chair)?: A Little Help needed to walk in hospital room?: A Little Help needed climbing 3-5 steps with a railing? : A Lot 6 Click Score: 17    End of Session Equipment Utilized During Treatment: Gait belt Activity Tolerance: Patient tolerated treatment well Patient left: in chair;with call bell/phone within reach;with chair alarm set Nurse Communication: Mobility status PT Visit Diagnosis: Other abnormalities of gait and  mobility (R26.89)     Time: 1040-1105 PT Time Calculation (min) (ACUTE ONLY): 25 min  Charges:  $Gait Training: 8-22 mins $Therapeutic Exercise: 8-22 mins                     Baxter Flattery, PT  Acute Rehab Dept ([place]) [phone] Pager [phone]  01/20/2021    [facility] 01/20/2021, 11:08 AM

## 2021-01-20 NOTE — Progress Notes (Signed)
Physical Therapy Treatment Patient Details Name: Jackie Stevens MRN: [id number] DOB: February 06, 1946 Today's Date: 01/20/2021    History of Present Illness s/p L TKA. PMH: mild neurocognitive d/o, OA, vertigo, obesity    PT Comments    Pt progressing very well. Improved wt shift with gait. Reviewed HEP with pt husband present. Ready for d/c, will have family assist at home    Follow Up Recommendations  Follow surgeon's recommendation for DC plan and follow-up therapies     Equipment Recommendations  Rolling walker with 5" wheels    Recommendations for Other Services       Precautions / Restrictions Precautions Precautions: Fall;Knee Precaution Comments: IND SLRs Required Braces or Orthoses: Knee Immobilizer - Left Knee Immobilizer - Left: Discontinue once straight leg raise with < 10 degree lag Restrictions Weight Bearing Restrictions: No Other Position/Activity Restrictions: WBAT    Mobility  Bed Mobility               General bed mobility comments: in chair on arrival  Transfers Overall transfer level: Needs assistance Equipment used: Rolling walker (2 wheeled) Transfers: Sit to/from Stand Sit to Stand: Supervision         General transfer comment: cues for hand placement and LLE management  Ambulation/Gait Ambulation/Gait assistance: Min guard Gait Distance (Feet): 150 Feet Assistive device: Rolling walker (2 wheeled) Gait Pattern/deviations: Step-to pattern;Decreased stance time - left     General Gait Details: cues for sequence and RW position   Stairs             Wheelchair Mobility    Modified Rankin (Stroke Patients Only)       Balance                                            Cognition Arousal/Alertness: Awake/alert Behavior During Therapy: WFL for tasks assessed/performed Overall Cognitive Status: Within Functional Limits for tasks assessed                                 General Comments: hx  neurocognitive d/o      Exercises Total Joint Exercises Ankle Circles/Pumps: AROM;Both;10 reps Quad Sets: AROM;Left;10 reps Short Arc Quad: AROM;Left;10 reps Heel Slides: AAROM;Left;5 reps Hip ABduction/ADduction: AROM;Left;10 reps Straight Leg Raises: AROM;Left;10 reps Long Arc Quad: AROM;Left;Seated (8 reps)    General Comments        Pertinent Vitals/Pain Pain Assessment: 0-10 Pain Score: 3  Pain Location: left knee Pain Descriptors / Indicators: Grimacing;Sore Pain Intervention(s): Limited activity within patient's tolerance;Monitored during session;Premedicated before session    Home Living                      Prior Function            PT Goals (current goals can now be found in the care plan section) Acute Rehab PT Goals Patient Stated Goal: less pain with walking PT Goal Formulation: With patient Time For Goal Achievement: 01/26/21 Potential to Achieve Goals: Good Progress towards PT goals: Progressing toward goals    Frequency    7X/week      PT Plan Current plan remains appropriate    Co-evaluation              AM-PAC PT "6 Clicks" Mobility   Outcome Measure  Help needed  turning from your back to your side while in a flat bed without using bedrails?: A Little Help needed moving from lying on your back to sitting on the side of a flat bed without using bedrails?: A Little Help needed moving to and from a bed to a chair (including a wheelchair)?: A Little Help needed standing up from a chair using your arms (e.g., wheelchair or bedside chair)?: A Little Help needed to walk in hospital room?: A Little Help needed climbing 3-5 steps with a railing? : A Lot 6 Click Score: 17    End of Session Equipment Utilized During Treatment: Gait belt Activity Tolerance: Patient tolerated treatment well Patient left: in chair;with call bell/phone within reach;with chair alarm set Nurse Communication: Mobility status PT Visit Diagnosis: Other  abnormalities of gait and mobility (R26.89)     Time: 1457-1530 PT Time Calculation (min) (ACUTE ONLY): 33 min  Charges:  $Gait Training: 8-22 mins $Therapeutic Exercise: 8-22 mins                     Baxter Flattery, PT  Acute Rehab Dept ([place]) [phone] Pager [phone]  01/20/2021    [facility] 01/20/2021, 3:37 PM

## 2021-01-21 DIAGNOSIS — K219 Gastro-esophageal reflux disease without esophagitis: Secondary | ICD-10-CM | POA: Diagnosis not present

## 2021-01-21 DIAGNOSIS — I10 Essential (primary) hypertension: Secondary | ICD-10-CM | POA: Diagnosis not present

## 2021-01-21 DIAGNOSIS — G3184 Mild cognitive impairment, so stated: Secondary | ICD-10-CM | POA: Diagnosis not present

## 2021-01-21 DIAGNOSIS — E559 Vitamin D deficiency, unspecified: Secondary | ICD-10-CM | POA: Diagnosis not present

## 2021-01-21 DIAGNOSIS — E785 Hyperlipidemia, unspecified: Secondary | ICD-10-CM | POA: Diagnosis not present

## 2021-01-21 DIAGNOSIS — Z471 Aftercare following joint replacement surgery: Secondary | ICD-10-CM | POA: Diagnosis not present

## 2021-01-21 DIAGNOSIS — G4733 Obstructive sleep apnea (adult) (pediatric): Secondary | ICD-10-CM | POA: Diagnosis not present

## 2021-01-21 DIAGNOSIS — M069 Rheumatoid arthritis, unspecified: Secondary | ICD-10-CM | POA: Diagnosis not present

## 2021-01-21 DIAGNOSIS — E669 Obesity, unspecified: Secondary | ICD-10-CM | POA: Diagnosis not present

## 2021-01-21 DIAGNOSIS — J4520 Mild intermittent asthma, uncomplicated: Secondary | ICD-10-CM | POA: Diagnosis not present

## 2021-01-22 ENCOUNTER — Telehealth: Payer: Medicare HMO | Admitting: Family Medicine

## 2021-01-22 NOTE — Discharge Summary (Incomplete)
.  ke

## 2021-01-23 ENCOUNTER — Ambulatory Visit: Payer: Medicare HMO | Admitting: Neurology

## 2021-01-23 DIAGNOSIS — J4520 Mild intermittent asthma, uncomplicated: Secondary | ICD-10-CM | POA: Diagnosis not present

## 2021-01-23 DIAGNOSIS — E669 Obesity, unspecified: Secondary | ICD-10-CM | POA: Diagnosis not present

## 2021-01-23 DIAGNOSIS — G4733 Obstructive sleep apnea (adult) (pediatric): Secondary | ICD-10-CM | POA: Diagnosis not present

## 2021-01-23 DIAGNOSIS — M069 Rheumatoid arthritis, unspecified: Secondary | ICD-10-CM | POA: Diagnosis not present

## 2021-01-23 DIAGNOSIS — I10 Essential (primary) hypertension: Secondary | ICD-10-CM | POA: Diagnosis not present

## 2021-01-23 DIAGNOSIS — E785 Hyperlipidemia, unspecified: Secondary | ICD-10-CM | POA: Diagnosis not present

## 2021-01-23 DIAGNOSIS — Z471 Aftercare following joint replacement surgery: Secondary | ICD-10-CM | POA: Diagnosis not present

## 2021-01-23 DIAGNOSIS — E559 Vitamin D deficiency, unspecified: Secondary | ICD-10-CM | POA: Diagnosis not present

## 2021-01-23 DIAGNOSIS — K219 Gastro-esophageal reflux disease without esophagitis: Secondary | ICD-10-CM | POA: Diagnosis not present

## 2021-01-23 DIAGNOSIS — G3184 Mild cognitive impairment, so stated: Secondary | ICD-10-CM | POA: Diagnosis not present

## 2021-01-26 DIAGNOSIS — I10 Essential (primary) hypertension: Secondary | ICD-10-CM | POA: Diagnosis not present

## 2021-01-26 DIAGNOSIS — G4733 Obstructive sleep apnea (adult) (pediatric): Secondary | ICD-10-CM | POA: Diagnosis not present

## 2021-01-26 DIAGNOSIS — G3184 Mild cognitive impairment, so stated: Secondary | ICD-10-CM | POA: Diagnosis not present

## 2021-01-26 DIAGNOSIS — M069 Rheumatoid arthritis, unspecified: Secondary | ICD-10-CM | POA: Diagnosis not present

## 2021-01-26 DIAGNOSIS — E559 Vitamin D deficiency, unspecified: Secondary | ICD-10-CM | POA: Diagnosis not present

## 2021-01-26 DIAGNOSIS — E785 Hyperlipidemia, unspecified: Secondary | ICD-10-CM | POA: Diagnosis not present

## 2021-01-26 DIAGNOSIS — K219 Gastro-esophageal reflux disease without esophagitis: Secondary | ICD-10-CM | POA: Diagnosis not present

## 2021-01-26 DIAGNOSIS — E669 Obesity, unspecified: Secondary | ICD-10-CM | POA: Diagnosis not present

## 2021-01-26 DIAGNOSIS — Z471 Aftercare following joint replacement surgery: Secondary | ICD-10-CM | POA: Diagnosis not present

## 2021-01-26 DIAGNOSIS — J4520 Mild intermittent asthma, uncomplicated: Secondary | ICD-10-CM | POA: Diagnosis not present

## 2021-01-26 NOTE — Discharge Summary (Signed)
Physician Discharge Summary   Patient ID: Jackie Stevens MRN: [id number] DOB/AGE: [birth year] 76 y.o.  Admit date: 01/19/2021 Discharge date: 01/20/2021  Primary Diagnosis: Osteoarthritis of Left Knee; s/p L TKA  Admission Diagnoses:  Past Medical History:  Diagnosis Date  . Abnormal cervical cytology 10/25/2012   Follows with Dr Leavy Cella of Gyn  . Allergic rhinitis 11/04/2010   Qualifier: Diagnosis of  By: Annamaria Boots MD, Clinton D   . Anemia 10/06/2013  . Anginal pain    pt has history of CP states had cardiac workup with no specific issues identified   . Arthritis    knees; right thumb; shoulders  . 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  Loss of glenohumeral joint space.  Well-maintained subacromial space.  Injected 09/30/2016 [name] .  Marland Kitchen Asthma, mild intermittent 04/02/2013  . Chicken pox as a child  . Colonic diverticular abscess 11/21/2012  . Constipation   . Decreased hearing   . Dermatitis 03/06/2017  . Diverticulitis 10/25/2012   pt. reports that a drain was placed - 09/2012    . Diverticulitis of rectosigmoid 11/28/2012  . Dry mouth   . Essential hypertension 11/08/2010   Qualifier: Diagnosis of  By: Annamaria Boots MD, Clinton D   . Excessive thirst   . External hemorrhoid, bleeding    "sometimes" (12-30-12)  . Frequent urination   . GERD (gastroesophageal reflux disease)   . H/O hiatal hernia   . Hand tingling   . Heart murmur   . History of kidney stones   . Hyperglycemia 01/17/2020  . Hyperlipidemia   . Hypothyroidism   . Incontinence   . Insomnia   . Kidney stones 1970's   "passed on their own" (12/30/12)  . Low back pain 06/09/2014  . Measles as a child  . 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 Obesity 09/11/2017  . Obstructive sleep apnea 11/04/2010   NPSG [facility] 07/15/10- AHI 13.5/ hr CPAP 10/APS    . Otitis media 01/07/2014   . Palpitations   . Paresthesia 03/23/2015   Left face  . PONV (postoperative nausea and vomiting)   . Rheumatoid arteritis   . Shortness of breath dyspnea    using stairs  . Sinus pain   . Swelling of both lower extremities   . Thyroid cancer 1980's  . Tinnitus   . UTI (urinary tract infection) 04/02/2013  . Vertigo 08/05/2019  . Vitamin D deficiency 02/28/2018   Discharge Diagnoses:   Principal Problem:   Osteoarthritis of left knee Active Problems:   Primary osteoarthritis of left knee  Estimated body mass index is 36.95 kg/m as calculated from the following:   Height as of this encounter: 5\' 2"  (1.575 m).   Weight as of this encounter: 91.6 kg.  Procedure:  Procedure(s) (LRB): TOTAL KNEE ARTHROPLASTY (Left)   Consults: None  HPI: Jackie Stevens is a 75 y.o. year old female with end stage OA of her left knee with progressively worsening pain and dysfunction. She has constant pain, with activity and at rest and significant functional deficits with difficulties even with ADLs. She has had extensive non-op management including analgesics, injections of cortisone and viscosupplements, and home exercise program, but remains in significant pain with significant dysfunction. Radiographs show bone on bone arthritis all 3 compartments.   Laboratory Data: Admission on 01/19/2021, Discharged on 01/20/2021  Component Date Value Ref Range Status  .  WBC 01/20/2021 14.3* 4.0 - 10.5 K/uL Final  . RBC 01/20/2021 3.59* 3.87 - 5.11 MIL/uL Final  . Hemoglobin 01/20/2021 10.5* 12.0 - 15.0 g/dL Final  . HCT 01/20/2021 32.8* 36.0 - 46.0 % Final  . MCV 01/20/2021 91.4  80.0 - 100.0 fL Final  . MCH 01/20/2021 29.2  26.0 - 34.0 pg Final  . MCHC 01/20/2021 32.0  30.0 - 36.0 g/dL Final  . RDW 01/20/2021 12.6  11.5 - 15.5 % Final  . Platelets 01/20/2021 233  150 - 400 K/uL Final  . nRBC 01/20/2021 0.0  0.0 - 0.2 % Final   Performed at [facility], [place] [address],  [place], [place] [address]  . Sodium 01/20/2021 134* 135 - 145 mmol/L Final  . Potassium 01/20/2021 4.6  3.5 - 5.1 mmol/L Final  . Chloride 01/20/2021 99  98 - 111 mmol/L Final  . CO2 01/20/2021 27  22 - 32 mmol/L Final  . Glucose, Bld 01/20/2021 141* 70 - 99 mg/dL Final   Glucose reference range applies only to samples taken after fasting for at least 8 hours.  . BUN 01/20/2021 18  8 - 23 mg/dL Final  . Creatinine, Ser 01/20/2021 0.65  0.44 - 1.00 mg/dL Final  . Calcium 01/20/2021 8.5* 8.9 - 10.3 mg/dL Final  . GFR, Estimated 01/20/2021 >60  >60 mL/min Final   Comment: (NOTE) Calculated using the CKD-EPI Creatinine Equation (2021)   . Anion gap 01/20/2021 8  5 - 15 Final   Performed at [facility], [place] [address] [place], [place] [address]  Hospital Outpatient Visit on 01/15/2021  Component Date Value Ref Range Status  . SARS Coronavirus 2 01/15/2021 NEGATIVE  NEGATIVE Final   Comment: (NOTE) SARS-CoV-2 target nucleic acids are NOT DETECTED.  The SARS-CoV-2 RNA is generally detectable in upper and lower respiratory specimens during the acute phase of infection. Negative results do not preclude SARS-CoV-2 infection, do not rule out co-infections with other pathogens, and should not be used as the sole basis for treatment or other patient management decisions. Negative results must be combined with clinical observations, patient history, and epidemiological information. The expected result is Negative.  Fact Sheet for Patients: SugarRoll.be  Fact Sheet for Healthcare Providers: https://www.woods-mathews.com/  This test is not yet approved or cleared by the [place] FDA and  has been authorized for detection and/or diagnosis of SARS-CoV-2 by FDA under an Emergency Use Authorization (EUA). This EUA will remain  in effect (meaning this test can be used) for the duration of the COVID-19 declaration under Se                           ction 564(b)(1) of the Act, 21 U.S.C. section 360bbb-3(b)(1), unless the authorization is terminated or revoked sooner.  Performed at [facility], [place] [address] [place], [place] [address]   Hospital Outpatient Visit on 01/12/2021  Component Date Value Ref Range Status  . WBC 01/12/2021 7.9  4.0 - 10.5 K/uL Final  . RBC 01/12/2021 4.28  3.87 - 5.11 MIL/uL Final  . Hemoglobin 01/12/2021 12.7  12.0 - 15.0 g/dL Final  . HCT 01/12/2021 38.7  36.0 - 46.0 % Final  . MCV 01/12/2021 90.4  80.0 - 100.0 fL Final  . MCH 01/12/2021 29.7  26.0 - 34.0 pg Final  . MCHC 01/12/2021 32.8  30.0 - 36.0 g/dL Final  . RDW 01/12/2021 13.0  11.5 - 15.5 %  Final  . Platelets 01/12/2021 275  150 - 400 K/uL Final  . nRBC 01/12/2021 0.0  0.0 - 0.2 % Final   Performed at [facility], [place] [address], [place], [place] [address]  . Sodium 01/12/2021 136  135 - 145 mmol/L Final  . Potassium 01/12/2021 4.3  3.5 - 5.1 mmol/L Final  . Chloride 01/12/2021 97* 98 - 111 mmol/L Final  . CO2 01/12/2021 28  22 - 32 mmol/L Final  . Glucose, Bld 01/12/2021 105* 70 - 99 mg/dL Final   Glucose reference range applies only to samples taken after fasting for at least 8 hours.  . BUN 01/12/2021 15  8 - 23 mg/dL Final  . Creatinine, Ser 01/12/2021 0.74  0.44 - 1.00 mg/dL Final  . Calcium 01/12/2021 9.1  8.9 - 10.3 mg/dL Final  . Total Protein 01/12/2021 6.6  6.5 - 8.1 g/dL Final  . Albumin 01/12/2021 4.1  3.5 - 5.0 g/dL Final  . AST 01/12/2021 23  15 - 41 U/L Final  . ALT 01/12/2021 23  0 - 44 U/L Final  . Alkaline Phosphatase 01/12/2021 82  38 - 126 U/L Final  . Total Bilirubin 01/12/2021 0.7  0.3 - 1.2 mg/dL Final  . GFR, Estimated 01/12/2021 >60  >60 mL/min Final   Comment: (NOTE) Calculated using the CKD-EPI Creatinine Equation (2021)   . Anion gap 01/12/2021 11  5 - 15 Final   Performed at [facility], [place] [address], [place], [place] [address]  . Prothrombin Time  01/12/2021 12.6  11.4 - 15.2 seconds Final  . INR 01/12/2021 1.0  0.8 - 1.2 Final   Comment: (NOTE) INR goal varies based on device and disease states. Performed at [facility], [place] [address] [place], [place] [address]   . aPTT 01/12/2021 31  24 - 36 seconds Final   Performed at [facility], [place] [address] [place], [place] [address]  . ABO/RH(D) 01/12/2021 A POS   Final  . Antibody Screen 01/12/2021 NEG   Final  . Sample Expiration 01/12/2021 01/22/2021,2359   Final  . Extend sample reason 01/12/2021    Final                   Value:NO TRANSFUSIONS OR PREGNANCY IN THE PAST 3 MONTHS Performed at [facility], [place] [address], [place], [place] [address]   . MRSA, PCR 01/12/2021 NEGATIVE  NEGATIVE Final  . Staphylococcus aureus 01/12/2021 NEGATIVE  NEGATIVE Final   Comment: (NOTE) The Xpert SA Assay (FDA approved for NASAL specimens in patients 49 years of age and older), is one component of a comprehensive surveillance program. It is not intended to diagnose infection nor to guide or monitor treatment. Performed at [facility], [place] [address] [place], [place] [address]      X-Rays:No results found.  EKG: Orders placed or performed in visit on 06/02/20  . EKG 12-Lead     Hospital Course: PERSEPHONE SCHRIEVER is a 75 y.o. who was admitted to [facility]. They were brought to the operating room on 01/19/2021 and underwent Procedure(s): TOTAL KNEE ARTHROPLASTY.  Patient tolerated the procedure well and was later transferred to the recovery room and then to the orthopaedic floor for postoperative care. They were given PO and IV analgesics for pain control following their surgery. They were given 24 hours of postoperative antibiotics of  Anti-infectives (From admission, onward)   Start  Dose/Rate Route Frequency Ordered Stop   01/19/21 1800  ceFAZolin (ANCEF) IVPB 2g/100 mL premix        2 g 200  mL/hr over 30 Minutes Intravenous Every 6 hours 01/19/21 1318 01/20/21 0109   01/19/21 0800  ceFAZolin (ANCEF) IVPB 2g/100 mL premix        2 g 200 mL/hr over 30 Minutes Intravenous On call to O.R. 01/19/21 6834 01/19/21 1030     and started on DVT prophylaxis in the form of Xarelto and TED hose.   PT and OT were ordered for total joint protocol. Discharge planning consulted to help with postop disposition and equipment needs.  Patient had a uneventful night on the evening of surgery. They started to get up OOB with therapy on 01/19/2021. Pt was seen during rounds and was ready to go home pending progress with therapy. She worked with therapy on POD #1 and was meeting goals. Pt was discharged to home later that day in stable condition.  Diet: Regular diet Activity: WBAT Follow-up: in two weeks Disposition: Home Discharged Condition: good   Discharge Instructions    Call MD / Call 911   Complete by: As directed    If you experience chest pain or shortness of breath, CALL 911 and be transported to the hospital emergency room.  If you develope a fever above 101 F, pus (white drainage) or increased drainage or redness at the wound, or calf pain, call your surgeon's office.   Change dressing   Complete by: As directed    You may remove the bulky bandage (ACE wrap and gauze) two days after surgery. You will have an adhesive waterproof bandage underneath. Leave this in place until your first follow-up appointment.   Constipation Prevention   Complete by: As directed    Drink plenty of fluids.  Prune juice may be helpful.  You may use a stool softener, such as Colace (over the counter) 100 mg twice a day.  Use MiraLax (over the counter) for constipation as needed.   Diet - low sodium heart healthy   Complete by: As directed    Do not put a pillow under the knee. Place it under the heel.   Complete by: As directed    Driving restrictions   Complete by: As directed    No driving for two weeks    TED hose   Complete by: As directed    Use stockings (TED hose) for three weeks on both leg(s).  You may remove them at night for sleeping.   Weight bearing as tolerated   Complete by: As directed      Allergies as of 01/20/2021      Reactions   Neomycin-bacitracin Zn-polymyx Rash   Polysporin- is tolerated    Niacin Other (See Comments), Cough   "cough til I threw up" (12/19/2012)   Ciprofloxacin Hives   Got cipro and flagyl at same time, localized hives to IV arm   Flagyl [metronidazole] Hives   Got cipro and flagyl at same time, localized hives to IV arm      Medication List    STOP taking these medications   azelastine 0.1 % nasal spray Commonly known as: ASTELIN   donepezil 10 MG tablet Commonly known as: ARICEPT   Krill Oil 350 MG Caps   MEGARED ADVANCED 4 IN 1 PO   MULTIVITAMIN WOMEN 50+ PO   naproxen 375 MG tablet Commonly known as: NAPROSYN   Prevagen Extra Strength 20 MG Caps Generic  drug: Apoaequorin   Turmeric 500 MG Caps     TAKE these medications   carboxymethylcellulose 0.5 % Soln Commonly known as: REFRESH PLUS Place 1 drop into both eyes 3 (three) times daily as needed (dry eyes).   docusate sodium 100 MG capsule Commonly known as: COLACE Take 100 mg by mouth daily.   famotidine 20 MG tablet Commonly known as: PEPCID TAKE 2 TABLETS BY MOUTH EVERY DAY What changed: when to take this   gabapentin 100 MG capsule Commonly known as: NEURONTIN Take a 300 mg capsule three times a day for two weeks following surgery.Then take a 300 mg capsule two times a day for two weeks. Then take a 300 mg capsule once a day for two weeks. Then discontinue.   hydrochlorothiazide 25 MG tablet Commonly known as: HYDRODIURIL Take 1 tablet (25 mg total) by mouth daily.   KETOCONAZOLE (TOPICAL) 1 % Sham Apply 1 Dose topically once a week.   levothyroxine 150 MCG tablet Commonly known as: SYNTHROID Take 150 mcg by mouth daily before breakfast.   losartan 100 MG  tablet Commonly known as: COZAAR TAKE 1 TABLET BY MOUTH EVERYDAY AT BEDTIME What changed: See the new instructions.   methocarbamol 500 MG tablet Commonly known as: ROBAXIN Take 1 tablet (500 mg total) by mouth every 6 (six) hours as needed for muscle spasms.   metoprolol tartrate 100 MG tablet Commonly known as: LOPRESSOR TAKE 1&1/2 TABLETS BY MOUTH TWICE A DAY What changed: See the new instructions.   nitroGLYCERIN 0.4 MG SL tablet Commonly known as: Nitrostat PLACE 1 TABLET UNDER THE TONGUE EVERY 5 MINUTES AS NEEDED FOR CHEST PAIN   omeprazole 40 MG capsule Commonly known as: PRILOSEC TAKE 1 CAPSULE BY MOUTH TWICE A DAY What changed: when to take this   oxyCODONE 5 MG immediate release tablet Commonly known as: Oxy IR/ROXICODONE Take 1-2 tablets (5-10 mg total) by mouth every 6 (six) hours as needed for severe pain.   polyethylene glycol 17 g packet Commonly known as: MIRALAX / GLYCOLAX Take 17 g by mouth daily.   rivaroxaban 10 MG Tabs tablet Commonly known as: XARELTO Take 1 tablet (10 mg total) by mouth daily with breakfast.   rosuvastatin 40 MG tablet Commonly known as: CRESTOR TAKE 1 TABLET BY MOUTH EVERY DAY   traMADol 50 MG tablet Commonly known as: ULTRAM Take 1-2 tablets (50-100 mg total) by mouth every 6 (six) hours as needed for moderate pain.            Discharge Care Instructions  (From admission, onward)         Start     Ordered   01/20/21 0000  Weight bearing as tolerated        01/20/21 0756   01/20/21 0000  Change dressing       Comments: You may remove the bulky bandage (ACE wrap and gauze) two days after surgery. You will have an adhesive waterproof bandage underneath. Leave this in place until your first follow-up appointment.   01/20/21 0756          Follow-up Information    Gaynelle Arabian, MD. Go on 02/03/2021.   Specialty: Orthopedic Surgery Why: You are scheduled for a post-operative appointment on 02-03-21 at 5:00 pm.   Contact information: [address] [place] [address] [phone]        [facility] Follow up.   Specialty: Home Health Services Why: to provide home health physical therapy Contact information: [address]  [place] [address] [phone]               Signed: Fenton Foy, MBA, PA-C Orthopedic Surgery 01/26/2021, 9:26 AM

## 2021-01-28 DIAGNOSIS — G4733 Obstructive sleep apnea (adult) (pediatric): Secondary | ICD-10-CM | POA: Diagnosis not present

## 2021-01-28 DIAGNOSIS — E669 Obesity, unspecified: Secondary | ICD-10-CM | POA: Diagnosis not present

## 2021-01-28 DIAGNOSIS — I10 Essential (primary) hypertension: Secondary | ICD-10-CM | POA: Diagnosis not present

## 2021-01-28 DIAGNOSIS — Z471 Aftercare following joint replacement surgery: Secondary | ICD-10-CM | POA: Diagnosis not present

## 2021-01-28 DIAGNOSIS — K219 Gastro-esophageal reflux disease without esophagitis: Secondary | ICD-10-CM | POA: Diagnosis not present

## 2021-01-28 DIAGNOSIS — E559 Vitamin D deficiency, unspecified: Secondary | ICD-10-CM | POA: Diagnosis not present

## 2021-01-28 DIAGNOSIS — G3184 Mild cognitive impairment, so stated: Secondary | ICD-10-CM | POA: Diagnosis not present

## 2021-01-28 DIAGNOSIS — M069 Rheumatoid arthritis, unspecified: Secondary | ICD-10-CM | POA: Diagnosis not present

## 2021-01-28 DIAGNOSIS — E785 Hyperlipidemia, unspecified: Secondary | ICD-10-CM | POA: Diagnosis not present

## 2021-01-28 DIAGNOSIS — J4520 Mild intermittent asthma, uncomplicated: Secondary | ICD-10-CM | POA: Diagnosis not present

## 2021-01-30 DIAGNOSIS — E559 Vitamin D deficiency, unspecified: Secondary | ICD-10-CM | POA: Diagnosis not present

## 2021-01-30 DIAGNOSIS — E785 Hyperlipidemia, unspecified: Secondary | ICD-10-CM | POA: Diagnosis not present

## 2021-01-30 DIAGNOSIS — J4520 Mild intermittent asthma, uncomplicated: Secondary | ICD-10-CM | POA: Diagnosis not present

## 2021-01-30 DIAGNOSIS — K219 Gastro-esophageal reflux disease without esophagitis: Secondary | ICD-10-CM | POA: Diagnosis not present

## 2021-01-30 DIAGNOSIS — I10 Essential (primary) hypertension: Secondary | ICD-10-CM | POA: Diagnosis not present

## 2021-01-30 DIAGNOSIS — G3184 Mild cognitive impairment, so stated: Secondary | ICD-10-CM | POA: Diagnosis not present

## 2021-01-30 DIAGNOSIS — E669 Obesity, unspecified: Secondary | ICD-10-CM | POA: Diagnosis not present

## 2021-01-30 DIAGNOSIS — G4733 Obstructive sleep apnea (adult) (pediatric): Secondary | ICD-10-CM | POA: Diagnosis not present

## 2021-01-30 DIAGNOSIS — Z471 Aftercare following joint replacement surgery: Secondary | ICD-10-CM | POA: Diagnosis not present

## 2021-01-30 DIAGNOSIS — M069 Rheumatoid arthritis, unspecified: Secondary | ICD-10-CM | POA: Diagnosis not present

## 2021-02-04 DIAGNOSIS — M1712 Unilateral primary osteoarthritis, left knee: Secondary | ICD-10-CM | POA: Diagnosis not present

## 2021-02-04 DIAGNOSIS — R2689 Other abnormalities of gait and mobility: Secondary | ICD-10-CM | POA: Diagnosis not present

## 2021-02-04 DIAGNOSIS — R531 Weakness: Secondary | ICD-10-CM | POA: Diagnosis not present

## 2021-02-10 DIAGNOSIS — R2689 Other abnormalities of gait and mobility: Secondary | ICD-10-CM | POA: Diagnosis not present

## 2021-02-10 DIAGNOSIS — M1712 Unilateral primary osteoarthritis, left knee: Secondary | ICD-10-CM | POA: Diagnosis not present

## 2021-02-10 DIAGNOSIS — R531 Weakness: Secondary | ICD-10-CM | POA: Diagnosis not present

## 2021-02-13 DIAGNOSIS — R2689 Other abnormalities of gait and mobility: Secondary | ICD-10-CM | POA: Diagnosis not present

## 2021-02-13 DIAGNOSIS — M1712 Unilateral primary osteoarthritis, left knee: Secondary | ICD-10-CM | POA: Diagnosis not present

## 2021-02-13 DIAGNOSIS — R531 Weakness: Secondary | ICD-10-CM | POA: Diagnosis not present

## 2021-02-17 DIAGNOSIS — M1712 Unilateral primary osteoarthritis, left knee: Secondary | ICD-10-CM | POA: Diagnosis not present

## 2021-02-17 DIAGNOSIS — G4733 Obstructive sleep apnea (adult) (pediatric): Secondary | ICD-10-CM | POA: Diagnosis not present

## 2021-02-17 DIAGNOSIS — R2689 Other abnormalities of gait and mobility: Secondary | ICD-10-CM | POA: Diagnosis not present

## 2021-02-17 DIAGNOSIS — R531 Weakness: Secondary | ICD-10-CM | POA: Diagnosis not present

## 2021-02-19 DIAGNOSIS — M1712 Unilateral primary osteoarthritis, left knee: Secondary | ICD-10-CM | POA: Diagnosis not present

## 2021-02-19 DIAGNOSIS — R2689 Other abnormalities of gait and mobility: Secondary | ICD-10-CM | POA: Diagnosis not present

## 2021-02-19 DIAGNOSIS — R531 Weakness: Secondary | ICD-10-CM | POA: Diagnosis not present

## 2021-02-23 DIAGNOSIS — M1712 Unilateral primary osteoarthritis, left knee: Secondary | ICD-10-CM | POA: Diagnosis not present

## 2021-02-23 DIAGNOSIS — R2689 Other abnormalities of gait and mobility: Secondary | ICD-10-CM | POA: Diagnosis not present

## 2021-02-23 DIAGNOSIS — R531 Weakness: Secondary | ICD-10-CM | POA: Diagnosis not present

## 2021-02-24 ENCOUNTER — Telehealth (INDEPENDENT_AMBULATORY_CARE_PROVIDER_SITE_OTHER): Payer: Medicare HMO | Admitting: Family Medicine

## 2021-02-24 ENCOUNTER — Other Ambulatory Visit: Payer: Self-pay

## 2021-02-24 DIAGNOSIS — R351 Nocturia: Secondary | ICD-10-CM

## 2021-02-24 DIAGNOSIS — E538 Deficiency of other specified B group vitamins: Secondary | ICD-10-CM

## 2021-02-24 DIAGNOSIS — E559 Vitamin D deficiency, unspecified: Secondary | ICD-10-CM

## 2021-02-24 DIAGNOSIS — E782 Mixed hyperlipidemia: Secondary | ICD-10-CM | POA: Diagnosis not present

## 2021-02-24 DIAGNOSIS — I10 Essential (primary) hypertension: Secondary | ICD-10-CM

## 2021-02-24 DIAGNOSIS — R739 Hyperglycemia, unspecified: Secondary | ICD-10-CM

## 2021-02-24 DIAGNOSIS — E038 Other specified hypothyroidism: Secondary | ICD-10-CM

## 2021-02-24 DIAGNOSIS — M1712 Unilateral primary osteoarthritis, left knee: Secondary | ICD-10-CM | POA: Diagnosis not present

## 2021-02-24 DIAGNOSIS — G3184 Mild cognitive impairment, so stated: Secondary | ICD-10-CM

## 2021-02-24 DIAGNOSIS — R5383 Other fatigue: Secondary | ICD-10-CM

## 2021-02-24 DIAGNOSIS — Z471 Aftercare following joint replacement surgery: Secondary | ICD-10-CM | POA: Diagnosis not present

## 2021-02-24 NOTE — Assessment & Plan Note (Signed)
S/p L TKR in February 2022 with Dr Maureen Ralphs. She is doing well with her exercises and she has increased ROM and less pain.

## 2021-02-24 NOTE — Assessment & Plan Note (Addendum)
hgba1c acceptable, minimize simple carbs. Increase exercise as tolerated. Reminded to drink 60-80 ounces of clear fluids a day. Eat protein every 4-5 hours. Is using Ensure once a day can consider twice a day

## 2021-02-24 NOTE — Assessment & Plan Note (Signed)
Encouraged heart healthy diet, increase exercise, avoid trans fats, consider a krill oil cap daily. Tolerating Rosuvastatin 

## 2021-02-24 NOTE — Assessment & Plan Note (Addendum)
Feels some worse since her knee surgery but she did not use general anesthesia. This is her greatest worry. Her husband helps her with her ADLs and they follow with neurology

## 2021-02-24 NOTE — Assessment & Plan Note (Signed)
Worse since her TKR check labs

## 2021-02-24 NOTE — Progress Notes (Signed)
Virtual Visit via Phone Note  I connected with Jackie Stevens on 02/24/2021 at  9:00 AM EDT by a phone enabled telemedicine application and verified that I am speaking with the correct person using two identifiers.  Location: Patient: Home, patient, her husband and provider were in visit Provider: Office   I discussed the limitations of evaluation and management by telemedicine and the availability of in person appointments. The patient expressed understanding and agreed to proceed. S Chism CMA was able to get the patient set up on video visit.     Subjective:    Patient ID: Jackie Stevens, female    DOB: 07/22/1946, 75 y.o.   MRN: [id number]  Chief Complaint  Patient presents with  . Follow-up  . Hyperlipidemia    HPI Patient is in today for follow up on chronic medical concerns. No recent febrile illness. She has under gone a TKR and is improving slowly but surely. She is moving better and while her appetite is low she is trying to eat well and hydrate. She is most concerned about her progressive memory loss she is a managing ADLs with the help of her husband. Denies CP/palp/SOB/HA/congestion/fevers/GI or GU c/o. Taking meds as prescribed  Past Medical History:  Diagnosis Date  . Abnormal cervical cytology 10/25/2012   Follows with Dr Leavy Cella of Gyn  . Allergic rhinitis 11/04/2010   Qualifier: Diagnosis of  By: Annamaria Boots MD, Clinton D   . Anemia 10/06/2013  . Anginal pain    pt has history of CP states had cardiac workup with no specific issues identified   . Arthritis    knees; right thumb; shoulders  . 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  Loss of glenohumeral joint space.  Well-maintained subacromial space.  Injected 09/30/2016 [name] .  Marland Kitchen Asthma, mild intermittent 04/02/2013  . Chicken pox as a child  . Colonic diverticular abscess  11/21/2012  . Constipation   . Decreased hearing   . Dermatitis 03/06/2017  . Diverticulitis 10/25/2012   pt. reports that a drain was placed - 09/2012    . Diverticulitis of rectosigmoid 11/28/2012  . Dry mouth   . Essential hypertension 11/08/2010   Qualifier: Diagnosis of  By: Annamaria Boots MD, Clinton D   . Excessive thirst   . External hemorrhoid, bleeding    "sometimes" (January 02, 2013)  . Frequent urination   . GERD (gastroesophageal reflux disease)   . H/O hiatal hernia   . Hand tingling   . Heart murmur   . History of kidney stones   . Hyperglycemia 01/17/2020  . Hyperlipidemia   . Hypothyroidism   . Incontinence   . Insomnia   . Kidney stones 1970's   "passed on their own" (January 02, 2013)  . Low back pain 06/09/2014  . Measles as a child  . 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 Obesity 09/11/2017  . Obstructive sleep apnea 11/04/2010   NPSG [facility] 07/15/10- AHI 13.5/ hr CPAP 10/APS    . Otitis media 01/07/2014  . Palpitations   . Paresthesia 03/23/2015   Left face  . PONV (postoperative nausea and vomiting)   . Rheumatoid arteritis   . Shortness of breath dyspnea    using stairs  . Sinus pain   . Swelling of both lower extremities   . Thyroid cancer 1980's  . Tinnitus   . UTI (urinary tract infection) 04/02/2013  . Vertigo  08/05/2019  . Vitamin D deficiency 02/28/2018    Past Surgical History:  Procedure Laterality Date  . CHOLECYSTECTOMY  1990  . COLON SURGERY    . COLOSTOMY REVISION  12/19/2012   Procedure: COLON RESECTION SIGMOID;  Surgeon: Gwenyth Ober, MD;  Location: [place];  Service: General;  Laterality: N/A;  . CYSTOSCOPY WITH STENT PLACEMENT  12/19/2012   Procedure: CYSTOSCOPY WITH STENT PLACEMENT;  Surgeon: Hanley Ben, MD;  Location: [place];  Service: Urology;  Laterality: N/A;  . DILATION AND CURETTAGE OF UTERUS  1960's   "lots of them; had miscarriages" (12/19/2012)  . LYSIS OF ADHESION N/A 12/02/2015   Procedure: LAPAROSCOPIC LYSIS OF ADHESION;  Surgeon: Johnathan Hausen, MD;  Location: [facility];  Service: General;  Laterality: N/A;  . ROBOTIC ASSISTED BILATERAL SALPINGO OOPHERECTOMY Bilateral 12/02/2015   Procedure: XI ROBOTIC ASSISTED BILATERAL SALPINGO OOPHORECTOMY;  Surgeon: Everitt Amber, MD;  Location: [facility];  Service: Gynecology;  Laterality: Bilateral;  . SIGMOID RESECTION / RECTOPEXY  12/19/2012  . THYROIDECTOMY, PARTIAL  1988   "then did iodine to remove the rest" (12/19/2012)  . TONSILLECTOMY  1951?  . TOTAL KNEE ARTHROPLASTY Left 01/19/2021   Procedure: TOTAL KNEE ARTHROPLASTY;  Surgeon: Gaynelle Arabian, MD;  Location: [facility];  Service: Orthopedics;  Laterality: Left;  54min  . TRANSRECTAL DRAINAGE OF PELVIC ABSCESS  10/27/2012  . VAGINAL HYSTERECTOMY  1970's   "still have my ovaries" (12/19/2012)    Family History  Problem Relation Age of Onset  . Heart disease Father   . Pneumonia Father   . Hypertension Father   . Hyperlipidemia Father   . Cancer Father        skin  . Stroke Father   . Alzheimer's disease Mother   . Heart disease Mother   . Depression Mother   . Emphysema Brother        marijuana and cigarettes  . Alcohol abuse Brother   . Hearing loss Brother   . Diabetes Maternal Grandmother   . Alzheimer's disease Paternal Grandmother   . Cancer Paternal Grandmother        lung?- smoker  . Hyperlipidemia Paternal Grandmother   . Heart attack Paternal Grandfather   . Alcohol abuse Paternal Grandfather   . Neurofibromatosis Son        schwanomatosis  . Neurofibromatosis Son        swanomatosis  . Cancer Paternal Aunt     Social History   Socioeconomic History  . Marital status: Married    Spouse name: [name]  . Number of children: 2  . Years of education: 77  . Highest education level: High school graduate  Occupational History  . Occupation: Retired  Tobacco Use  . Smoking status: Never Smoker  . Smokeless tobacco: Never Used  Vaping Use  . Vaping Use: Never used  Substance and Sexual Activity  . Alcohol use: No     Alcohol/week: 0.0 standard drinks  . Drug use: No  . Sexual activity: Not Currently  Other Topics Concern  . Not on file  Social History Narrative   Married    Children   Right handed   Some college   Social Determinants of Health   Financial Resource Strain: Low Risk   . Difficulty of Paying Living Expenses: Not hard at all  Food Insecurity: No Food Insecurity  . Worried About Charity fundraiser in the Last Year: Never true  . Ran Out of Food in the Last Year: Never  true  Transportation Needs: No Transportation Needs  . Lack of Transportation (Medical): No  . Lack of Transportation (Non-Medical): No  Physical Activity: Not on file  Stress: Not on file  Social Connections: Not on file  Intimate Partner Violence: Not on file    Outpatient Medications Prior to Visit  Medication Sig Dispense Refill  . carboxymethylcellulose (REFRESH PLUS) 0.5 % SOLN Place 1 drop into both eyes 3 (three) times daily as needed (dry eyes).    Marland Kitchen docusate sodium (COLACE) 100 MG capsule Take 100 mg by mouth daily.    . famotidine (PEPCID) 20 MG tablet TAKE 2 TABLETS BY MOUTH EVERY DAY (Patient taking differently: Take 40 mg by mouth at bedtime.) 180 tablet 1  . gabapentin (NEURONTIN) 100 MG capsule Take a 300 mg capsule three times a day for two weeks following surgery.Then take a 300 mg capsule two times a day for two weeks. Then take a 300 mg capsule once a day for two weeks. Then discontinue. 84 capsule 0  . hydrochlorothiazide (HYDRODIURIL) 25 MG tablet Take 1 tablet (25 mg total) by mouth daily. 90 tablet 1  . levothyroxine (SYNTHROID, LEVOTHROID) 150 MCG tablet Take 150 mcg by mouth daily before breakfast.    . losartan (COZAAR) 100 MG tablet TAKE 1 TABLET BY MOUTH EVERYDAY AT BEDTIME (Patient taking differently: Take 100 mg by mouth at bedtime.) 90 tablet 1  . methocarbamol (ROBAXIN) 500 MG tablet Take 1 tablet (500 mg total) by mouth every 6 (six) hours as needed for muscle spasms. 40 tablet 0   . metoprolol tartrate (LOPRESSOR) 100 MG tablet TAKE 1&1/2 TABLETS BY MOUTH TWICE A DAY (Patient taking differently: Take 150 mg by mouth 2 (two) times daily.) 270 tablet 1  . nitroGLYCERIN (NITROSTAT) 0.4 MG SL tablet PLACE 1 TABLET UNDER THE TONGUE EVERY 5 MINUTES AS NEEDED FOR CHEST PAIN 25 tablet 1  . omeprazole (PRILOSEC) 40 MG capsule TAKE 1 CAPSULE BY MOUTH TWICE A DAY (Patient taking differently: Take 40 mg by mouth in the morning and at bedtime.) 180 capsule 1  . oxyCODONE (OXY IR/ROXICODONE) 5 MG immediate release tablet Take 1-2 tablets (5-10 mg total) by mouth every 6 (six) hours as needed for severe pain. 42 tablet 0  . polyethylene glycol (MIRALAX / GLYCOLAX) 17 g packet Take 17 g by mouth daily.    . rivaroxaban (XARELTO) 10 MG TABS tablet Take 1 tablet (10 mg total) by mouth daily with breakfast. 21 tablet 0  . rosuvastatin (CRESTOR) 40 MG tablet TAKE 1 TABLET BY MOUTH EVERY DAY (Patient taking differently: Take 40 mg by mouth daily.) 90 tablet 1  . traMADol (ULTRAM) 50 MG tablet Take 1-2 tablets (50-100 mg total) by mouth every 6 (six) hours as needed for moderate pain. 40 tablet 0  . KETOCONAZOLE, TOPICAL, 1 % SHAM Apply 1 Dose topically once a week. (Patient not taking: Reported on 01/01/2021) 200 mL 1   No facility-administered medications prior to visit.    Allergies  Allergen Reactions  . Neomycin-Bacitracin Zn-Polymyx Rash    Polysporin- is tolerated   . Niacin Other (See Comments) and Cough    "cough til I threw up" (12/19/2012)  . Ciprofloxacin Hives    Got cipro and flagyl at same time, localized hives to IV arm  . Flagyl [Metronidazole] Hives    Got cipro and flagyl at same time, localized hives to IV arm    Review of Systems  Constitutional: Negative for chills, fever and malaise/fatigue.  HENT: Negative for congestion.   Eyes: Negative for pain.  Respiratory: Negative for cough and shortness of breath.   Cardiovascular: Negative for chest pain, palpitations  and leg swelling.  Gastrointestinal: Negative for abdominal pain, diarrhea, nausea and vomiting.  Genitourinary: Negative for dysuria and urgency.  Musculoskeletal: Negative for back pain and myalgias.  Skin: Negative for rash.  Neurological: Negative for dizziness and headaches.       Objective:    Physical Exam Constitutional:      General: She is not in acute distress.    Appearance: Normal appearance. She is not ill-appearing.  HENT:     Head: Normocephalic and atraumatic.     Right Ear: External ear normal.     Left Ear: External ear normal.     Nose: Nose normal.  Eyes:     Pupils: Pupils are equal, round, and reactive to light.  Pulmonary:     Effort: Pulmonary effort is normal.  Musculoskeletal:     Cervical back: Normal range of motion.  Neurological:     Mental Status: She is oriented to person, place, and time.  Psychiatric:        Behavior: Behavior normal.     There were no vitals taken for this visit. Wt Readings from Last 3 Encounters:  01/19/21 202 lb (91.6 kg)  01/12/21 202 lb (91.6 kg)  12/04/20 201 lb 12.8 oz (91.5 kg)    Diabetic Foot Exam - Simple   No data filed    Lab Results  Component Value Date   WBC 14.3 (H) 01/20/2021   HGB 10.5 (L) 01/20/2021   HCT 32.8 (L) 01/20/2021   PLT 233 01/20/2021   GLUCOSE 141 (H) 01/20/2021   CHOL 135 10/16/2020   TRIG 74 10/16/2020   HDL 52 10/16/2020   LDLDIRECT 81.0 02/12/2020   LDLCALC 68 10/16/2020   ALT 23 01/12/2021   AST 23 01/12/2021   NA 134 (L) 01/20/2021   K 4.6 01/20/2021   CL 99 01/20/2021   CREATININE 0.65 01/20/2021   BUN 18 01/20/2021   CO2 27 01/20/2021   TSH 0.24 (L) 10/16/2020   INR 1.0 01/12/2021   HGBA1C 5.8 (H) 10/16/2020    Lab Results  Component Value Date   TSH 0.24 (L) 10/16/2020   Lab Results  Component Value Date   WBC 14.3 (H) 01/20/2021   HGB 10.5 (L) 01/20/2021   HCT 32.8 (L) 01/20/2021   MCV 91.4 01/20/2021   PLT 233 01/20/2021   Lab Results   Component Value Date   NA 134 (L) 01/20/2021   K 4.6 01/20/2021   CO2 27 01/20/2021   GLUCOSE 141 (H) 01/20/2021   BUN 18 01/20/2021   CREATININE 0.65 01/20/2021   BILITOT 0.7 01/12/2021   ALKPHOS 82 01/12/2021   AST 23 01/12/2021   ALT 23 01/12/2021   PROT 6.6 01/12/2021   ALBUMIN 4.1 01/12/2021   CALCIUM 8.5 (L) 01/20/2021   ANIONGAP 8 01/20/2021   GFR 77.61 02/12/2020   Lab Results  Component Value Date   CHOL 135 10/16/2020   Lab Results  Component Value Date   HDL 52 10/16/2020   Lab Results  Component Value Date   LDLCALC 68 10/16/2020   Lab Results  Component Value Date   TRIG 74 10/16/2020   Lab Results  Component Value Date   CHOLHDL 2.6 10/16/2020   Lab Results  Component Value Date   HGBA1C 5.8 (H) 10/16/2020       Assessment &  Plan:   Problem List Items Addressed This Visit    Hyperlipidemia, mixed    Encouraged heart healthy diet, increase exercise, avoid trans fats, consider a krill oil cap daily.  Tolerating Rosuvastatin      Relevant Orders   Lipid panel   Essential hypertension   Relevant Orders   CBC   Comprehensive metabolic panel   TSH   Hypothyroidism    Sees Dr Forde Dandy to monitor but will check TSH today as she only sees him once a year and she is suffering from fatigue      Vitamin B12 deficiency    Supplement and monitor      Relevant Orders   Vitamin B12   Other fatigue    Worse since her TKR check labs      Vitamin D deficiency    Supplement and monitor      Relevant Orders   VITAMIN D 25 Hydroxy (Vit-D Deficiency, Fractures)   Hyperglycemia    hgba1c acceptable, minimize simple carbs. Increase exercise as tolerated. Reminded to drink 60-80 ounces of clear fluids a day. Eat protein every 4-5 hours. Is using Ensure once a day can consider twice a day      Relevant Orders   Hemoglobin A1c   Mild neurocognitive disorder    Feels some worse since her knee surgery but she did not use general anesthesia.        Primary osteoarthritis of left knee    S/p L TKR in February 2022 with Dr Maureen Ralphs. She is doing well with her exercises and she has increased ROM and less pain.       Other Visit Diagnoses    Nocturia    -  Primary   Relevant Orders   Urine Culture   Urinalysis        No orders of the defined types were placed in this encounter.    I discussed the assessment and treatment plan with the patient. The patient was provided an opportunity to ask questions and all were answered. The patient agreed with the plan and demonstrated an understanding of the instructions.   The patient was advised to call back or seek an in-person evaluation if the symptoms worsen or if the condition fails to improve as anticipated.  I provided 20 minutes of non-face-to-face time during this encounter.

## 2021-02-24 NOTE — Assessment & Plan Note (Signed)
Supplement and monitor 

## 2021-02-24 NOTE — Patient Instructions (Signed)

## 2021-02-24 NOTE — Assessment & Plan Note (Signed)
Sees Dr Forde Dandy to monitor but will check TSH today as she only sees him once a year and she is suffering from fatigue

## 2021-02-25 DIAGNOSIS — R2689 Other abnormalities of gait and mobility: Secondary | ICD-10-CM | POA: Diagnosis not present

## 2021-02-25 DIAGNOSIS — R531 Weakness: Secondary | ICD-10-CM | POA: Diagnosis not present

## 2021-02-25 DIAGNOSIS — M1712 Unilateral primary osteoarthritis, left knee: Secondary | ICD-10-CM | POA: Diagnosis not present

## 2021-02-25 NOTE — Assessment & Plan Note (Signed)
Monitor and report any concerns, no changes to meds. Encouraged heart healthy diet such as the DASH diet and exercise as tolerated.  ?

## 2021-03-02 DIAGNOSIS — G4733 Obstructive sleep apnea (adult) (pediatric): Secondary | ICD-10-CM | POA: Diagnosis not present

## 2021-03-03 ENCOUNTER — Other Ambulatory Visit: Payer: Self-pay

## 2021-03-03 ENCOUNTER — Other Ambulatory Visit (INDEPENDENT_AMBULATORY_CARE_PROVIDER_SITE_OTHER): Payer: Medicare HMO

## 2021-03-03 DIAGNOSIS — E782 Mixed hyperlipidemia: Secondary | ICD-10-CM

## 2021-03-03 DIAGNOSIS — E559 Vitamin D deficiency, unspecified: Secondary | ICD-10-CM

## 2021-03-03 DIAGNOSIS — I10 Essential (primary) hypertension: Secondary | ICD-10-CM

## 2021-03-03 DIAGNOSIS — R739 Hyperglycemia, unspecified: Secondary | ICD-10-CM

## 2021-03-03 DIAGNOSIS — E538 Deficiency of other specified B group vitamins: Secondary | ICD-10-CM | POA: Diagnosis not present

## 2021-03-03 DIAGNOSIS — R351 Nocturia: Secondary | ICD-10-CM

## 2021-03-04 LAB — URINALYSIS, ROUTINE W REFLEX MICROSCOPIC
Bilirubin Urine: NEGATIVE
Hgb urine dipstick: NEGATIVE
Nitrite: NEGATIVE
Specific Gravity, Urine: 1.02 (ref 1.000–1.030)
Total Protein, Urine: NEGATIVE
Urine Glucose: NEGATIVE
Urobilinogen, UA: 0.2 (ref 0.0–1.0)
pH: 7 (ref 5.0–8.0)

## 2021-03-04 LAB — COMPREHENSIVE METABOLIC PANEL
ALT: 15 U/L (ref 0–35)
AST: 16 U/L (ref 0–37)
Albumin: 4.1 g/dL (ref 3.5–5.2)
Alkaline Phosphatase: 100 U/L (ref 39–117)
BUN: 16 mg/dL (ref 6–23)
CO2: 30 mEq/L (ref 19–32)
Calcium: 8.9 mg/dL (ref 8.4–10.5)
Chloride: 98 mEq/L (ref 96–112)
Creatinine, Ser: 0.74 mg/dL (ref 0.40–1.20)
GFR: 78.51 mL/min (ref >60.00)
Glucose, Bld: 108 mg/dL — ABNORMAL HIGH (ref 70–99)
Potassium: 4.6 mEq/L (ref 3.5–5.1)
Sodium: 135 mEq/L (ref 135–145)
Total Bilirubin: 0.3 mg/dL (ref 0.2–1.2)
Total Protein: 6.2 g/dL (ref 6.0–8.3)

## 2021-03-04 LAB — LIPID PANEL
Cholesterol: 132 mg/dL (ref 0–200)
HDL: 54.2 mg/dL (ref >39.00)
LDL Cholesterol: 60 mg/dL (ref 0–99)
NonHDL: 77.48
Total CHOL/HDL Ratio: 2
Triglycerides: 87 mg/dL (ref 0.0–149.0)
VLDL: 17.4 mg/dL (ref 0.0–40.0)

## 2021-03-04 LAB — URINE CULTURE
MICRO NUMBER:: 11677119
SPECIMEN QUALITY:: ADEQUATE

## 2021-03-04 LAB — HEMOGLOBIN A1C: Hgb A1c MFr Bld: 5.5 % (ref 4.6–6.5)

## 2021-03-04 LAB — CBC
HCT: 35.9 % — ABNORMAL LOW (ref 36.0–46.0)
Hemoglobin: 11.8 g/dL — ABNORMAL LOW (ref 12.0–15.0)
MCHC: 33 g/dL (ref 30.0–36.0)
MCV: 87.6 fl (ref 78.0–100.0)
Platelets: 322 10*3/uL (ref 150.0–400.0)
RBC: 4.09 Mil/uL (ref 3.87–5.11)
RDW: 13.6 % (ref 11.5–15.5)
WBC: 7.3 10*3/uL (ref 4.0–10.5)

## 2021-03-04 LAB — VITAMIN D 25 HYDROXY (VIT D DEFICIENCY, FRACTURES): VITD: 73.28 ng/mL (ref 30.00–100.00)

## 2021-03-04 LAB — VITAMIN B12: Vitamin B-12: 521 pg/mL (ref 211–911)

## 2021-03-04 LAB — TSH: TSH: 0.15 u[IU]/mL — ABNORMAL LOW (ref 0.35–4.50)

## 2021-03-09 ENCOUNTER — Other Ambulatory Visit: Payer: Self-pay | Admitting: Family Medicine

## 2021-03-09 NOTE — Telephone Encounter (Signed)
Do you want to start her back?  She was seen in ER on 01/20/21.

## 2021-03-16 ENCOUNTER — Other Ambulatory Visit: Payer: Self-pay | Admitting: Family Medicine

## 2021-03-20 DIAGNOSIS — G4733 Obstructive sleep apnea (adult) (pediatric): Secondary | ICD-10-CM | POA: Diagnosis not present

## 2021-04-19 DIAGNOSIS — G4733 Obstructive sleep apnea (adult) (pediatric): Secondary | ICD-10-CM | POA: Diagnosis not present

## 2021-04-21 ENCOUNTER — Ambulatory Visit: Payer: Medicare HMO | Admitting: Podiatry

## 2021-04-21 ENCOUNTER — Other Ambulatory Visit: Payer: Self-pay

## 2021-04-21 ENCOUNTER — Encounter: Payer: Self-pay | Admitting: Podiatry

## 2021-04-21 DIAGNOSIS — B351 Tinea unguium: Secondary | ICD-10-CM

## 2021-04-21 DIAGNOSIS — L84 Corns and callosities: Secondary | ICD-10-CM

## 2021-04-21 DIAGNOSIS — Q828 Other specified congenital malformations of skin: Secondary | ICD-10-CM

## 2021-04-21 DIAGNOSIS — M79672 Pain in left foot: Secondary | ICD-10-CM | POA: Diagnosis not present

## 2021-04-21 DIAGNOSIS — M79676 Pain in unspecified toe(s): Secondary | ICD-10-CM

## 2021-04-21 DIAGNOSIS — M79671 Pain in right foot: Secondary | ICD-10-CM | POA: Diagnosis not present

## 2021-04-21 NOTE — Patient Instructions (Signed)
Apply Betadine Ointment to right great toe once daily for one week.

## 2021-04-26 NOTE — Progress Notes (Signed)
Subjective:  Patient ID: Jackie Stevens, female    DOB: 01-12-1946,  MRN: [id number]  [name] presents to clinic today for corn(s) right foot , callus(es) b/l and painful mycotic nails.  Pain interferes with ambulation. Aggravating factors include wearing enclosed shoe gear. Painful toenails interfere with ambulation. Aggravating factors include wearing enclosed shoe gear. Pain is relieved with periodic professional debridement. Painful corns and calluses are aggravated when weightbearing with and without shoegear. Pain is relieved with periodic professional debridement.   She states her memory appears to be worsening. She would like to proceed with her foot surgery with Dr. Sherryle Lis.  Allergies  Allergen Reactions  . Neomycin-Bacitracin Zn-Polymyx Rash    Polysporin- is tolerated   . Niacin Other (See Comments) and Cough    "cough til I threw up" (12/19/2012)  . Ciprofloxacin Hives    Got cipro and flagyl at same time, localized hives to IV arm  . Flagyl [Metronidazole] Hives    Got cipro and flagyl at same time, localized hives to IV arm    Review of Systems: Negative except as noted in the HPI. Objective:   Constitutional VELA RENDER is a pleasant 75 y.o. Caucasian female, morbidly obese in NAD. AAO x 3.   Vascular Capillary fill time to digits <3 seconds b/l lower extremities. Palpable pedal pulses b/l LE. Pedal hair present. Lower extremity skin temperature gradient within normal limits. No pain with calf compression b/l. No cyanosis or clubbing noted.  Neurologic Normal speech. Oriented to person, place, and time. Protective sensation intact 5/5 intact bilaterally with 10g monofilament b/l. Vibratory sensation intact b/l.  Dermatologic Pedal skin with normal turgor, texture and tone bilaterally. No open wounds bilaterally. No interdigital macerations bilaterally. Toenails 1-5 b/l elongated, discolored, dystrophic, thickened, crumbly with subungual debris and tenderness to  dorsal palpation. Hyperkeratotic lesion(s) R hallux and R 2nd toe. Right hallux lesion has moderate hyperkeratosis.  No erythema, no edema, no drainage, no fluctuance. Porokeratotic lesion(s) submet head 2 right foot and submet head 4 right foot. No erythema, no edema, no drainage, no fluctuance.  Orthopedic: Normal muscle strength 5/5 to all lower extremity muscle groups bilaterally. No pain crepitus or joint limitation noted with ROM b/l. Hallux valgus with bunion deformity noted b/l lower extremities. Hammertoes noted to the 2-5 bilaterally.   Radiographs: None Assessment:   1. Pain due to onychomycosis of toenail   2. Corns   3. Porokeratosis   4. Pain in both feet    Plan:  Patient was evaluated and treated and all questions answered.  Onychomycosis with pain -Nails palliatively debridement as below -Educated on self-care  Procedure: Nail Debridement Rationale: Pain Type of Debridement: manual, sharp debridement. Instrumentation: Nail nipper, rotary burr. Number of Nails: 10 -Examined patient. -Patient to continue soft, supportive shoe gear daily. -Toenails 1-5 b/l were debrided in length and girth with sterile nail nippers and dremel without iatrogenic bleeding.  -Corn(s) R hallux and R 2nd toe pared utilizing sterile scalpel blade.. Total number debrided=2. Betadine Ointment applied to right hallux. She is to apply Betadine ointment to right hallux once daily for one week. -Painful porokeratotic lesion(s) submet head 2 right foot and submet head 4 right foot pared and enucleated with sterile scalpel blade without incident. Total number of lesions debrided=2. -Regarding her possible future foot surgery, I suggested her husband be present at next visit with Dr. Sherryle Lis due to her memory issues.Will inform Dr. Sherryle Lis. -Patient to report any pedal injuries to medical professional immediately. -  Patient/POA to call should there be question/concern in the interim.  Return in about  3 months (around 07/22/2021).  Marzetta Board, DPM

## 2021-05-09 ENCOUNTER — Emergency Department (HOSPITAL_BASED_OUTPATIENT_CLINIC_OR_DEPARTMENT_OTHER): Payer: Medicare HMO

## 2021-05-09 ENCOUNTER — Encounter (HOSPITAL_BASED_OUTPATIENT_CLINIC_OR_DEPARTMENT_OTHER): Payer: Self-pay

## 2021-05-09 ENCOUNTER — Emergency Department (HOSPITAL_BASED_OUTPATIENT_CLINIC_OR_DEPARTMENT_OTHER)
Admission: EM | Admit: 2021-05-09 | Discharge: 2021-05-09 | Disposition: A | Discharge status: Home / Self Care | Payer: Medicare HMO | Attending: Emergency Medicine | Admitting: Emergency Medicine

## 2021-05-09 DIAGNOSIS — Z85850 Personal history of malignant neoplasm of thyroid: Secondary | ICD-10-CM | POA: Diagnosis not present

## 2021-05-09 DIAGNOSIS — M79642 Pain in left hand: Secondary | ICD-10-CM | POA: Insufficient documentation

## 2021-05-09 DIAGNOSIS — M5030 Other cervical disc degeneration, unspecified cervical region: Secondary | ICD-10-CM | POA: Insufficient documentation

## 2021-05-09 DIAGNOSIS — S0083XA Contusion of other part of head, initial encounter: Secondary | ICD-10-CM

## 2021-05-09 DIAGNOSIS — M19042 Primary osteoarthritis, left hand: Secondary | ICD-10-CM | POA: Diagnosis not present

## 2021-05-09 DIAGNOSIS — I10 Essential (primary) hypertension: Secondary | ICD-10-CM | POA: Diagnosis not present

## 2021-05-09 DIAGNOSIS — S0181XA Laceration without foreign body of other part of head, initial encounter: Secondary | ICD-10-CM | POA: Diagnosis not present

## 2021-05-09 DIAGNOSIS — J4520 Mild intermittent asthma, uncomplicated: Secondary | ICD-10-CM | POA: Diagnosis not present

## 2021-05-09 DIAGNOSIS — Z96652 Presence of left artificial knee joint: Secondary | ICD-10-CM | POA: Insufficient documentation

## 2021-05-09 DIAGNOSIS — M79646 Pain in unspecified finger(s): Secondary | ICD-10-CM | POA: Diagnosis not present

## 2021-05-09 DIAGNOSIS — M79641 Pain in right hand: Secondary | ICD-10-CM | POA: Diagnosis not present

## 2021-05-09 DIAGNOSIS — S0511XA Contusion of eyeball and orbital tissues, right eye, initial encounter: Secondary | ICD-10-CM | POA: Insufficient documentation

## 2021-05-09 DIAGNOSIS — M50322 Other cervical disc degeneration at C5-C6 level: Secondary | ICD-10-CM | POA: Diagnosis not present

## 2021-05-09 DIAGNOSIS — M25512 Pain in left shoulder: Secondary | ICD-10-CM | POA: Diagnosis not present

## 2021-05-09 DIAGNOSIS — T07XXXA Unspecified multiple injuries, initial encounter: Secondary | ICD-10-CM | POA: Diagnosis not present

## 2021-05-09 DIAGNOSIS — Z471 Aftercare following joint replacement surgery: Secondary | ICD-10-CM | POA: Diagnosis not present

## 2021-05-09 DIAGNOSIS — W010XXA Fall on same level from slipping, tripping and stumbling without subsequent striking against object, initial encounter: Secondary | ICD-10-CM | POA: Insufficient documentation

## 2021-05-09 DIAGNOSIS — Z79899 Other long term (current) drug therapy: Secondary | ICD-10-CM | POA: Insufficient documentation

## 2021-05-09 DIAGNOSIS — M19012 Primary osteoarthritis, left shoulder: Secondary | ICD-10-CM | POA: Diagnosis not present

## 2021-05-09 DIAGNOSIS — M25562 Pain in left knee: Secondary | ICD-10-CM | POA: Diagnosis not present

## 2021-05-09 DIAGNOSIS — Z7901 Long term (current) use of anticoagulants: Secondary | ICD-10-CM | POA: Insufficient documentation

## 2021-05-09 DIAGNOSIS — E039 Hypothyroidism, unspecified: Secondary | ICD-10-CM | POA: Diagnosis not present

## 2021-05-09 DIAGNOSIS — R519 Headache, unspecified: Secondary | ICD-10-CM | POA: Diagnosis not present

## 2021-05-09 DIAGNOSIS — S0990XA Unspecified injury of head, initial encounter: Secondary | ICD-10-CM | POA: Diagnosis not present

## 2021-05-09 DIAGNOSIS — M542 Cervicalgia: Secondary | ICD-10-CM | POA: Diagnosis not present

## 2021-05-09 DIAGNOSIS — S0591XA Unspecified injury of right eye and orbit, initial encounter: Secondary | ICD-10-CM | POA: Diagnosis present

## 2021-05-09 DIAGNOSIS — M19041 Primary osteoarthritis, right hand: Secondary | ICD-10-CM | POA: Diagnosis not present

## 2021-05-09 DIAGNOSIS — Z743 Need for continuous supervision: Secondary | ICD-10-CM | POA: Diagnosis not present

## 2021-05-09 DIAGNOSIS — Z043 Encounter for examination and observation following other accident: Secondary | ICD-10-CM | POA: Diagnosis not present

## 2021-05-09 NOTE — ED Provider Notes (Signed)
[facility] EMERGENC[facility] DEPT Provider Note   CSN: [id number] Arrival date & time: 05/09/21  1455     History Chief Complaint  Patient presents with  . Fall    Jackie Stevens is a 75 y.o. female.  Mechanical fall prior to arrival.  Pain to the left shoulder, face, left knee.  History of left knee replacement.  Bruising to the right eye.  No loss consciousness.  The history is provided by the patient.  Fall This is a new problem. The current episode started less than 1 hour ago. The problem has been resolved. Pertinent negatives include no chest pain, no abdominal pain, no headaches and no shortness of breath. Nothing aggravates the symptoms. Nothing relieves the symptoms. She has tried nothing for the symptoms. The treatment provided no relief.       Past Medical History:  Diagnosis Date  . Abnormal cervical cytology 10/25/2012   Follows with Dr Leavy Cella of Gyn  . Allergic rhinitis 11/04/2010   Qualifier: Diagnosis of  By: Annamaria Boots MD, Clinton D   . Anemia 10/06/2013  . Anginal pain    pt has history of CP states had cardiac workup with no specific issues identified   . Arthritis    knees; right thumb; shoulders  . 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  Loss of glenohumeral joint space.  Well-maintained subacromial space.  Injected 09/30/2016 [name] .  Marland Kitchen Asthma, mild intermittent 04/02/2013  . Chicken pox as a child  . Colonic diverticular abscess 11/21/2012  . Constipation   . Decreased hearing   . Dermatitis 03/06/2017  . Diverticulitis 10/25/2012   pt. reports that a drain was placed - 09/2012    . Diverticulitis of rectosigmoid 11/28/2012  . Dry mouth   . Essential hypertension 11/08/2010   Qualifier: Diagnosis of  By: Annamaria Boots MD, Clinton D   . Excessive thirst   . External hemorrhoid, bleeding    "sometimes"  (30-Dec-2012)  . Frequent urination   . GERD (gastroesophageal reflux disease)   . H/O hiatal hernia   . Hand tingling   . Heart murmur   . History of kidney stones   . Hyperglycemia 01/17/2020  . Hyperlipidemia   . Hypothyroidism   . Incontinence   . Insomnia   . Kidney stones 1970's   "passed on their own" (2012-12-30)  . Low back pain 06/09/2014  . Measles as a child  . 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 Obesity 09/11/2017  . Obstructive sleep apnea 11/04/2010   NPSG [facility] 07/15/10- AHI 13.5/ hr CPAP 10/APS    . Otitis media 01/07/2014  . Palpitations   . Paresthesia 03/23/2015   Left face  . PONV (postoperative nausea and vomiting)   . Rheumatoid arteritis   . Shortness of breath dyspnea    using stairs  . Sinus pain   . Swelling of both lower extremities   . Thyroid cancer 1980's  . Tinnitus   . UTI (urinary tract infection) 04/02/2013  . Vertigo 08/05/2019  . Vitamin D deficiency 02/28/2018    Patient Active Problem List   Diagnosis Date Noted  . Primary osteoarthritis of left knee 01/19/2021  . Osteoarthritis of left knee 11/21/2020  . 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 Hyperglycemia 01/17/2020  . Vertigo 08/05/2019  . Palpitations 05/28/2019  . Other fatigue 02/28/2018  . Shortness of breath on exertion 02/28/2018  .  Vitamin D deficiency 02/28/2018  . Obesity 09/11/2017  . Dermatitis 03/06/2017  . Arthritis of left shoulder region 09/30/2016  . Pain of joint of left ankle and foot 09/15/2016  . Left shoulder pain 09/14/2016  . Preventative health care 02/26/2016  . Radicular pain of sacrum 02/26/2016  . Vitamin B12 deficiency 02/26/2016  . Ventral hernia 12/02/2015  . Pelvic mass in female 12/02/2015  . Colitis 09/14/2015  . Paresthesia 03/23/2015  . Otitis, externa, infective 03/13/2015  . Hyponatremia 03/13/2015  . Cystitis 09/10/2014  . Headache 08/21/2014  . Low back pain 06/09/2014  . Otitis media 01/07/2014  . Anemia 10/06/2013  .  Medicare annual wellness visit, subsequent 10/06/2013  . Asthma, mild intermittent 04/02/2013  . Internal hemorrhoid, bleeding 01/23/2013  . Diverticulitis of rectosigmoid 11/28/2012  . Colonic diverticular abscess 11/21/2012  . Abnormal cervical cytology 10/25/2012  . Cancer ([place])   . Insomnia   . Hypothyroidism   . Constipation   . Cough 09/02/2012  . Chest pain 08/05/2011  . Hyperlipidemia, mixed 11/08/2010  . Essential hypertension 11/08/2010  . GERD (gastroesophageal reflux disease) 11/08/2010  . Obstructive sleep apnea 11/04/2010  . Allergic rhinitis 11/04/2010    Past Surgical History:  Procedure Laterality Date  . CHOLECYSTECTOMY  1990  . COLON SURGERY    . COLOSTOMY REVISION  12/19/2012   Procedure: COLON RESECTION SIGMOID;  Surgeon: Gwenyth Ober, MD;  Location: [facility];  Service: General;  Laterality: N/A;  . CYSTOSCOPY WITH STENT PLACEMENT  12/19/2012   Procedure: CYSTOSCOPY WITH STENT PLACEMENT;  Surgeon: Hanley Ben, MD;  Location: [facility];  Service: Urology;  Laterality: N/A;  . DILATION AND CURETTAGE OF UTERUS  1960's   "lots of them; had miscarriages" (12/19/2012)  . LYSIS OF ADHESION N/A 12/02/2015   Procedure: LAPAROSCOPIC LYSIS OF ADHESION;  Surgeon: Johnathan Hausen, MD;  Location: [facility];  Service: General;  Laterality: N/A;  . ROBOTIC ASSISTED BILATERAL SALPINGO OOPHERECTOMY Bilateral 12/02/2015   Procedure: XI ROBOTIC ASSISTED BILATERAL SALPINGO OOPHORECTOMY;  Surgeon: Everitt Amber, MD;  Location: [facility];  Service: Gynecology;  Laterality: Bilateral;  . SIGMOID RESECTION / RECTOPEXY  12/19/2012  . THYROIDECTOMY, PARTIAL  1988   "then did iodine to remove the rest" (12/19/2012)  . TONSILLECTOMY  1951?  . TOTAL KNEE ARTHROPLASTY Left 01/19/2021   Procedure: TOTAL KNEE ARTHROPLASTY;  Surgeon: Gaynelle Arabian, MD;  Location: [facility];  Service: Orthopedics;  Laterality: Left;  48min  . TRANSRECTAL DRAINAGE OF PELVIC ABSCESS  10/27/2012  . VAGINAL HYSTERECTOMY  1970's    "still have my ovaries" (12/19/2012)     OB History    Gravida  4   Para  2   Term  2   Preterm      AB  2   Living        SAB      IAB      Ectopic      Multiple      Live Births              Family History  Problem Relation Age of Onset  . Heart disease Father   . Pneumonia Father   . Hypertension Father   . Hyperlipidemia Father   . Cancer Father        skin  . Stroke Father   . Alzheimer's disease Mother   . Heart disease Mother   . Depression Mother   . Emphysema Brother        marijuana and  cigarettes  . Alcohol abuse Brother   . Hearing loss Brother   . Diabetes Maternal Grandmother   . Alzheimer's disease Paternal Grandmother   . Cancer Paternal Grandmother        lung?- smoker  . Hyperlipidemia Paternal Grandmother   . Heart attack Paternal Grandfather   . Alcohol abuse Paternal Grandfather   . Neurofibromatosis Son        schwanomatosis  . Neurofibromatosis Son        swanomatosis  . Cancer Paternal Aunt     Social History   Tobacco Use  . Smoking status: Never Smoker  . Smokeless tobacco: Never Used  Vaping Use  . Vaping Use: Never used  Substance Use Topics  . Alcohol use: No    Alcohol/week: 0.0 standard drinks  . Drug use: No    Home Medications Prior to Admission medications   Medication Sig Start Date End Date Taking? Authorizing Provider  Black Pepper-Turmeric (TURMERIC CURCUMIN) 04-999 MG CAPS 1 tablet daily. 02/09/16   [provider]  carboxymethylcellulose (REFRESH PLUS) 0.5 % SOLN Place 1 drop into both eyes 3 (three) times daily as needed (dry eyes).    [provider]  Cholecalciferol-Vitamin C (VITAMIN D3-VITAMIN C) 1000-500 UNIT-MG CAPS daily. 04/08/10   [provider]  COVID-19 mRNA vaccine, Moderna, 100 MCG/0.5ML injection INJECT AS DIRECTED 10/20/20 10/20/21  Carlyle Basques, MD  docusate sodium (COLACE) 100 MG capsule Take 100 mg by mouth daily.    [provider]   famotidine (PEPCID) 20 MG tablet TAKE 2 TABLETS BY MOUTH EVERY DAY Patient taking differently: Take 40 mg by mouth at bedtime. 12/17/20   Mosie Lukes, MD  fluticasone [facility] ALLERGY RELIEF) 50 MCG/ACT nasal spray PRN (seasonal) 02/11/17   [provider]  gabapentin (NEURONTIN) 100 MG capsule Take a 300 mg capsule three times a day for two weeks following surgery.Then take a 300 mg capsule two times a day for two weeks. Then take a 300 mg capsule once a day for two weeks. Then discontinue. 01/20/21   Fenton Foy D, PA-C  hydrochlorothiazide (HYDRODIURIL) 25 MG tablet Take 1 tablet (25 mg total) by mouth daily. 01/06/21   Mosie Lukes, MD  levothyroxine (SYNTHROID, LEVOTHROID) 150 MCG tablet Take 150 mcg by mouth daily before breakfast.    [provider]  losartan (COZAAR) 100 MG tablet TAKE 1 TABLET BY MOUTH EVERYDAY AT BEDTIME Patient taking differently: Take 100 mg by mouth at bedtime. 10/14/20   Mosie Lukes, MD  meclizine (ANTIVERT) 25 MG tablet take one a day 08/17/19   [provider]  methocarbamol (ROBAXIN) 500 MG tablet Take 1 tablet (500 mg total) by mouth every 6 (six) hours as needed for muscle spasms. 01/20/21   Fenton Foy D, PA-C  metoprolol tartrate (LOPRESSOR) 100 MG tablet TAKE 1&1/2 TABLETS BY MOUTH TWICE A DAY 03/16/21   Mosie Lukes, MD  naproxen (NAPROSYN) 375 MG tablet Take 1 tablet (375 mg total) by mouth 2 (two) times daily as needed for moderate pain or headache. 03/09/21   Mosie Lukes, MD  nitroGLYCERIN (NITROSTAT) 0.4 MG SL tablet PLACE 1 TABLET UNDER THE TONGUE EVERY 5 MINUTES AS NEEDED FOR CHEST PAIN 11/07/18   Mosie Lukes, MD  omeprazole (PRILOSEC) 40 MG capsule TAKE 1 CAPSULE BY MOUTH TWICE A DAY Patient taking differently: Take 40 mg by mouth in the morning and at bedtime. 12/17/20   Mosie Lukes, MD  oxyCODONE (OXY IR/ROXICODONE) 5  MG immediate release tablet Take 1-2 tablets (5-10 mg total) by mouth every 6 (six) hours  as needed for severe pain. 01/20/21   Fenton Foy D, PA-C  polyethylene glycol (MIRALAX / GLYCOLAX) 17 g packet Take 17 g by mouth daily.    [provider]  Psyllium-Calcium (METAMUCIL PLUS CALCIUM) CAPS daily. 04/08/10   [provider]  rivaroxaban (XARELTO) 10 MG TABS tablet Take 1 tablet (10 mg total) by mouth daily with breakfast. 01/20/21   Fenton Foy D, PA-C  rosuvastatin (CRESTOR) 40 MG tablet TAKE 1 TABLET BY MOUTH EVERY DAY Patient taking differently: Take 40 mg by mouth daily. 12/03/20   Mosie Lukes, MD  traMADol (ULTRAM) 50 MG tablet Take 1-2 tablets (50-100 mg total) by mouth every 6 (six) hours as needed for moderate pain. 01/20/21   Fenton Foy D, PA-C    Allergies    Neomycin-bacitracin zn-polymyx, Niacin, Ciprofloxacin, and Flagyl [metronidazole]  Review of Systems   Review of Systems  Constitutional: Negative for chills and fever.  HENT: Negative for ear pain and sore throat.   Eyes: Negative for pain and visual disturbance.  Respiratory: Negative for cough and shortness of breath.   Cardiovascular: Negative for chest pain and palpitations.  Gastrointestinal: Negative for abdominal pain and vomiting.  Genitourinary: Negative for dysuria and hematuria.  Musculoskeletal: Positive for arthralgias (pain and swelling to the left knee). Negative for back pain.  Skin: Positive for color change (bruising to the right eye). Negative for rash.  Neurological: Negative for seizures, syncope and headaches.  All other systems reviewed and are negative.   Physical Exam Updated Vital Signs BP (!) 177/78 (BP Location: Right Arm)   Pulse 65   Temp 99 F (37.2 C) (Oral)   Resp 18   SpO2 100%   Physical Exam Vitals and nursing note reviewed.  Constitutional:      General: She is not in acute distress.    Appearance: She is well-developed. She is not ill-appearing.  HENT:     Head: Normocephalic and atraumatic.  Eyes:     Extraocular Movements:  Extraocular movements intact.     Conjunctiva/sclera: Conjunctivae normal.     Pupils: Pupils are equal, round, and reactive to light.     Comments: Bruising around the right eye but no laceration, no signs of entrapment  Neck:     Comments: In cervical collar, no midline spinal tenderness Cardiovascular:     Rate and Rhythm: Normal rate and regular rhythm.     Pulses: Normal pulses.     Heart sounds: Normal heart sounds. No murmur heard.   Pulmonary:     Effort: Pulmonary effort is normal. No respiratory distress.     Breath sounds: Normal breath sounds.  Abdominal:     Palpations: Abdomen is soft.     Tenderness: There is no abdominal tenderness.  Musculoskeletal:        General: Tenderness present.     Cervical back: Neck supple. No tenderness.     Comments: Tenderness to posterior left shoulder and left knee with some swelling to the left knee, tenderness to both hands but no obvious deformities, arthritic changes in the MCPs throughout  Skin:    General: Skin is warm and dry.     Capillary Refill: Capillary refill takes less than 2 seconds.  Neurological:     General: No focal deficit present.     Mental Status: She is alert and oriented to person, place, and time.  Cranial Nerves: No cranial nerve deficit.     Sensory: No sensory deficit.     Motor: No weakness.     ED Results / Procedures / Treatments   Labs (all labs ordered are listed, but only abnormal results are displayed) Labs Reviewed - No data to display  EKG None  Radiology CT Head Wo Contrast  Result Date: 05/09/2021 CLINICAL DATA:  Fall, laceration to forehead EXAM: CT HEAD WITHOUT CONTRAST CT MAXILLOFACIAL WITHOUT CONTRAST CT CERVICAL SPINE WITHOUT CONTRAST TECHNIQUE: Multidetector CT imaging of the head, cervical spine, and maxillofacial structures were performed using the standard protocol without intravenous contrast. Multiplanar CT image reconstructions of the cervical spine and maxillofacial  structures were also generated. COMPARISON:  MR brain, 12/20/2019, CT brain, 07/17/2019 FINDINGS: CT HEAD FINDINGS Brain: No evidence of acute infarction, hemorrhage, extra-axial collection or mass lesion/mass effect. Redemonstrated prominence of the lateral and third ventricles with mild periventricular and deep white matter hypodensity. Vascular: No hyperdense vessel or unexpected calcification. CT FACIAL BONES FINDINGS Skull: Normal. Negative for fracture or focal lesion. Facial bones: No displaced fractures or dislocations. Sinuses/Orbits: No acute finding. Other: None. CT CERVICAL SPINE FINDINGS Alignment: Normal. Skull base and vertebrae: No acute fracture. No primary bone lesion or focal pathologic process. Soft tissues and spinal canal: No prevertebral fluid or swelling. No visible canal hematoma. Disc levels: Moderate multilevel disc space height loss and osteophytosis. Upper chest: Negative. Other: None. IMPRESSION: 1. No acute intracranial pathology. 2. Redemonstrated prominence of the lateral and third ventricles with mild periventricular and deep white matter hypodensity. This appearance is unchanged compared to prior examinations and may be related to chronic, global cerebral volume loss. Normal pressure hydrocephalus could also produce this appearance 3. No displaced fracture or dislocation of the facial bones. 4. No fracture or static subluxation of the cervical spine. 5. Moderate multilevel cervical disc degenerative disease. Electronically Signed   By: Eddie Candle M.D.   On: 05/09/2021 16:23   CT Cervical Spine Wo Contrast  Result Date: 05/09/2021 CLINICAL DATA:  Fall, laceration to forehead EXAM: CT HEAD WITHOUT CONTRAST CT MAXILLOFACIAL WITHOUT CONTRAST CT CERVICAL SPINE WITHOUT CONTRAST TECHNIQUE: Multidetector CT imaging of the head, cervical spine, and maxillofacial structures were performed using the standard protocol without intravenous contrast. Multiplanar CT image reconstructions of  the cervical spine and maxillofacial structures were also generated. COMPARISON:  MR brain, 12/20/2019, CT brain, 07/17/2019 FINDINGS: CT HEAD FINDINGS Brain: No evidence of acute infarction, hemorrhage, extra-axial collection or mass lesion/mass effect. Redemonstrated prominence of the lateral and third ventricles with mild periventricular and deep white matter hypodensity. Vascular: No hyperdense vessel or unexpected calcification. CT FACIAL BONES FINDINGS Skull: Normal. Negative for fracture or focal lesion. Facial bones: No displaced fractures or dislocations. Sinuses/Orbits: No acute finding. Other: None. CT CERVICAL SPINE FINDINGS Alignment: Normal. Skull base and vertebrae: No acute fracture. No primary bone lesion or focal pathologic process. Soft tissues and spinal canal: No prevertebral fluid or swelling. No visible canal hematoma. Disc levels: Moderate multilevel disc space height loss and osteophytosis. Upper chest: Negative. Other: None. IMPRESSION: 1. No acute intracranial pathology. 2. Redemonstrated prominence of the lateral and third ventricles with mild periventricular and deep white matter hypodensity. This appearance is unchanged compared to prior examinations and may be related to chronic, global cerebral volume loss. Normal pressure hydrocephalus could also produce this appearance 3. No displaced fracture or dislocation of the facial bones. 4. No fracture or static subluxation of the cervical spine. 5. Moderate multilevel cervical disc  degenerative disease. Electronically Signed   By: Eddie Candle M.D.   On: 05/09/2021 16:23   DG Shoulder Left  Result Date: 05/09/2021 CLINICAL DATA:  Fall.  Left shoulder pain. EXAM: LEFT SHOULDER - 2+ VIEW COMPARISON:  04/25/2018. FINDINGS: No fracture or dislocation. Marked narrowing of the glenohumeral joint space with subchondral sclerosis and prominent marginal osteophytes from the inferior humeral head, lesser from the inferior glenoid. AC joint  normally spaced and aligned with no significant degenerative/arthropathic changes. Soft tissues are unremarkable. IMPRESSION: 1. No fracture or dislocation. 2. Advanced arthropathic changes of the glenohumeral joint stable from the prior study. Electronically Signed   By: Lajean Manes M.D.   On: 05/09/2021 16:28   DG Knee Complete 4 Views Left  Result Date: 05/09/2021 CLINICAL DATA:  Recent fall with left knee pain, initial encounter EXAM: LEFT KNEE - COMPLETE 4+ VIEW COMPARISON:  None. FINDINGS: Left knee prosthesis is seen in satisfactory position. No acute fracture or dislocation is noted. No joint effusion is seen. IMPRESSION: No acute abnormality noted. Electronically Signed   By: Inez Catalina M.D.   On: 05/09/2021 16:32   DG Hand Complete Left  Result Date: 05/09/2021 CLINICAL DATA:  Recent fall with bilateral hand pain, initial encounter EXAM: LEFT HAND - COMPLETE 3+ VIEW COMPARISON:  None. FINDINGS: Considerable degenerative changes are noted at the first [facility] joint. Interphalangeal degenerative changes are noted as well. No acute fracture or dislocation is noted. No soft tissue abnormality is seen. IMPRESSION: Diffuse degenerative changes without acute abnormality. Electronically Signed   By: Inez Catalina M.D.   On: 05/09/2021 16:54   DG Hand Complete Right  Result Date: 05/09/2021 CLINICAL DATA:  Recent fall with bilateral hand pain, initial encounter EXAM: RIGHT HAND - COMPLETE 3+ VIEW COMPARISON:  None. FINDINGS: Extensive degenerative changes are noted at the first [facility] joint as well as the interphalangeal joint similar to that seen on the left. No acute fracture or dislocation is noted. No soft tissue abnormality is seen. IMPRESSION: Diffuse degenerative changes without acute abnormality. Electronically Signed   By: Inez Catalina M.D.   On: 05/09/2021 16:55   CT Maxillofacial Wo Contrast  Result Date: 05/09/2021 CLINICAL DATA:  Fall, laceration to forehead EXAM: CT HEAD WITHOUT CONTRAST  CT MAXILLOFACIAL WITHOUT CONTRAST CT CERVICAL SPINE WITHOUT CONTRAST TECHNIQUE: Multidetector CT imaging of the head, cervical spine, and maxillofacial structures were performed using the standard protocol without intravenous contrast. Multiplanar CT image reconstructions of the cervical spine and maxillofacial structures were also generated. COMPARISON:  MR brain, 12/20/2019, CT brain, 07/17/2019 FINDINGS: CT HEAD FINDINGS Brain: No evidence of acute infarction, hemorrhage, extra-axial collection or mass lesion/mass effect. Redemonstrated prominence of the lateral and third ventricles with mild periventricular and deep white matter hypodensity. Vascular: No hyperdense vessel or unexpected calcification. CT FACIAL BONES FINDINGS Skull: Normal. Negative for fracture or focal lesion. Facial bones: No displaced fractures or dislocations. Sinuses/Orbits: No acute finding. Other: None. CT CERVICAL SPINE FINDINGS Alignment: Normal. Skull base and vertebrae: No acute fracture. No primary bone lesion or focal pathologic process. Soft tissues and spinal canal: No prevertebral fluid or swelling. No visible canal hematoma. Disc levels: Moderate multilevel disc space height loss and osteophytosis. Upper chest: Negative. Other: None. IMPRESSION: 1. No acute intracranial pathology. 2. Redemonstrated prominence of the lateral and third ventricles with mild periventricular and deep white matter hypodensity. This appearance is unchanged compared to prior examinations and may be related to chronic, global cerebral volume loss. Normal pressure hydrocephalus could  also produce this appearance 3. No displaced fracture or dislocation of the facial bones. 4. No fracture or static subluxation of the cervical spine. 5. Moderate multilevel cervical disc degenerative disease. Electronically Signed   By: Eddie Candle M.D.   On: 05/09/2021 16:23    Procedures Procedures   Medications Ordered in ED Medications - No data to display  ED  Course  I have reviewed the triage vital signs and the nursing notes.  Pertinent labs & imaging results that were available during my care of the patient were reviewed by me and considered in my medical decision making (see chart for details).    MDM Rules/Calculators/A&P                           [name] is here after mechanical fall.  Not on blood thinners.  Overall unremarkable vitals.  Bruising around the right eye.  Did not lose consciousness.  No lacerations.  Pain to bilateral hands, left shoulder, left knee.  Has history of left knee replacement.  Has some swelling to the left knee and bruising.  Ambulatory after fall.  Will get head CT, neck CT, x-rays of the left knee, both hands, left shoulder.  Has no midline spinal tenderness.  CT imaging negative for any fractures.  Extremity x-rays also negative for fracture.  Overall suspect contusions.  Supportive care recommended at home.  Discharged in ED in good condition.  This chart was dictated using voice recognition software.  Despite best efforts to proofread,  errors can occur which can change the documentation meaning.   Final Clinical Impression(s) / ED Diagnoses Final diagnoses:  Contusion of face, initial encounter    Rx / DC Orders ED Discharge Orders    None       Lennice Sites, DO 05/09/21 1705

## 2021-05-09 NOTE — ED Triage Notes (Signed)
She states she tripped over a fixed piece of her husband's mobility equipment. She states she fell forward. She c/o some pain at right orbit area, at which she has edema and ecchymosis, especially of eyelid. She tells me her vision is normal. PEARL at 31mm. She is oriented to all except date with clear speech.

## 2021-05-09 NOTE — ED Notes (Addendum)
Reports tripping and falling onto face. R eye bruising and swelling noted. Mentions discomfort to mid thoracic spine, L knee and tingling to bilateral ring fingers. L posterior/ ulnar hand bruising noted. Family at [facility]. Pending EDP assessment. Tylenol 1000mg  PTA.

## 2021-05-09 NOTE — ED Notes (Signed)
Reports feeling better. Denies new sx. EDP at [facility].

## 2021-05-20 DIAGNOSIS — G4733 Obstructive sleep apnea (adult) (pediatric): Secondary | ICD-10-CM | POA: Diagnosis not present

## 2021-05-21 ENCOUNTER — Ambulatory Visit: Payer: Medicare HMO | Admitting: Podiatry

## 2021-05-25 ENCOUNTER — Ambulatory Visit (HOSPITAL_BASED_OUTPATIENT_CLINIC_OR_DEPARTMENT_OTHER)
Admission: RE | Admit: 2021-05-25 | Discharge: 2021-05-25 | Disposition: A | Discharge status: Home / Self Care | Payer: Medicare HMO | Source: Ambulatory Visit | Attending: Medical | Admitting: Medical

## 2021-05-25 ENCOUNTER — Ambulatory Visit (INDEPENDENT_AMBULATORY_CARE_PROVIDER_SITE_OTHER): Payer: Medicare HMO | Admitting: Medical

## 2021-05-25 ENCOUNTER — Other Ambulatory Visit: Payer: Self-pay

## 2021-05-25 VITALS — BP 150/70 | HR 60 | Temp 98.5°F | Resp 18 | Ht 62.0 in | Wt 198.0 lb

## 2021-05-25 DIAGNOSIS — M7989 Other specified soft tissue disorders: Secondary | ICD-10-CM | POA: Diagnosis not present

## 2021-05-25 DIAGNOSIS — M25511 Pain in right shoulder: Secondary | ICD-10-CM

## 2021-05-25 DIAGNOSIS — M546 Pain in thoracic spine: Secondary | ICD-10-CM

## 2021-05-25 DIAGNOSIS — I10 Essential (primary) hypertension: Secondary | ICD-10-CM

## 2021-05-25 DIAGNOSIS — M79602 Pain in left arm: Secondary | ICD-10-CM

## 2021-05-25 DIAGNOSIS — M25512 Pain in left shoulder: Secondary | ICD-10-CM

## 2021-05-25 NOTE — Progress Notes (Signed)
Subjective:    Patient ID: Jackie Stevens, female    DOB: [birth year], 75 y.o.   MRN: [id number]  HPI   Pt in for some left shoulder area pain. She states some pain in both shoulders and lower neck area. Fall was the May 28 th. Had diffuse pain at first. Then as other area subsided left shoulder pain beame predominant.   Pt describes that that she fell forward and hit her face.   Pt left arm pain is worse than rt side.   No chest pain.  Pt has naproxyn 375 mg bid. She has used about 4 of her oxycodone in past 2 weeks.   Review of Systems  Constitutional:  Negative for chills, fever and unexpected weight change.  HENT:  Negative for congestion and drooling.   Respiratory:  Negative for cough, chest tightness, shortness of breath and wheezing.   Cardiovascular:  Positive for chest pain. Negative for palpitations.  Genitourinary:  Negative for dysuria.  Musculoskeletal:        See hpi.  Skin:  Negative for rash.  Neurological:  Negative for dizziness, speech difficulty, weakness, numbness and headaches.  Hematological:  Negative for adenopathy.  Psychiatric/Behavioral:  Negative for behavioral problems, decreased concentration and hallucinations. The patient is not nervous/anxious.     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  Loss of glenohumeral joint space.  Well-maintained subacromial space.  Injected 09/30/2016 [name] .   Asthma, mild intermittent 04/02/2013   Chicken  pox as a child   Colonic diverticular abscess 11/21/2012   Constipation    Decreased hearing    Dermatitis 03/06/2017   Diverticulitis 10/25/2012   pt. reports that a drain was placed - 09/2012     Diverticulitis of rectosigmoid 11/28/2012   Dry mouth    Essential hypertension 11/08/2010   Qualifier: Diagnosis of  By: Annamaria Boots MD, Clinton D    Excessive thirst    External hemorrhoid, bleeding    "sometimes" (01/03/2013)   Frequent urination    GERD (gastroesophageal reflux disease)    H/O hiatal hernia    Hand tingling    Heart murmur    History of kidney stones    Hyperglycemia 01/17/2020   Hyperlipidemia    Hypothyroidism    Incontinence    Insomnia    Kidney stones 1970's   "passed on their own" (2013-01-03)   Low back pain 06/09/2014   Measles as a child   Mild neurocognitive disorder 02/05/2020   Obesity 09/11/2017   Obstructive sleep apnea 11/04/2010   NPSG [facility] 07/15/10- AHI 13.5/ hr CPAP 10/[facility]     Otitis media 01/07/2014   Palpitations    Paresthesia 03/23/2015   Left face   PONV (postoperative nausea and vomiting)    Rheumatoid arteritis    Shortness of breath dyspnea    using stairs   Sinus pain    Swelling of both lower extremities    Thyroid cancer 1980's  Tinnitus    UTI (urinary tract infection) 04/02/2013   Vertigo 08/05/2019   Vitamin D deficiency 02/28/2018     Social History   Socioeconomic History   Marital status: Married    Spouse name: [name]   Number of children: 2   Years of education: 12   Highest education level: High school graduate  Occupational History   Occupation: Retired  Tobacco Use   Smoking status: Never   Smokeless tobacco: Never  Scientific laboratory technician Use: Never used  Substance and Sexual Activity   Alcohol use: No    Alcohol/week: 0.0 standard drinks   Drug use: No   Sexual activity: Not Currently  Other Topics Concern   Not on file  Social History Narrative   Married    Children   Right handed   Some college   Social  Determinants of Health   Financial Resource Strain: Low Risk    Difficulty of Paying Living Expenses: Not hard at all  Food Insecurity: No Food Insecurity   Worried About Charity fundraiser in the Last Year: Never true   [place] in the Last Year: Never true  Transportation Needs: No Transportation Needs   Lack of Transportation (Medical): No   Lack of Transportation (Non-Medical): No  Physical Activity: Not on file  Stress: Not on file  Social Connections: Not on file  Intimate Partner Violence: Not on file    Past Surgical History:  Procedure Laterality Date   [place]  12/19/2012   Procedure: COLON RESECTION SIGMOID;  Surgeon: Gwenyth Ober, MD;  Location: [facility];  Service: General;  Laterality: N/A;   CYSTOSCOPY WITH STENT PLACEMENT  12/19/2012   Procedure: CYSTOSCOPY WITH STENT PLACEMENT;  Surgeon: Hanley Ben, MD;  Location: [facility];  Service: Urology;  Laterality: N/A;   DILATION AND CURETTAGE OF UTERUS  1960's   "lots of them; had miscarriages" (12/19/2012)   LYSIS OF ADHESION N/A 12/02/2015   Procedure: LAPAROSCOPIC LYSIS OF ADHESION;  Surgeon: Johnathan Hausen, MD;  Location: [facility];  Service: General;  Laterality: N/A;   ROBOTIC ASSISTED BILATERAL SALPINGO OOPHERECTOMY Bilateral 12/02/2015   Procedure: XI ROBOTIC ASSISTED BILATERAL SALPINGO OOPHORECTOMY;  Surgeon: Everitt Amber, MD;  Location: [facility];  Service: Gynecology;  Laterality: Bilateral;   SIGMOID RESECTION / RECTOPEXY  12/19/2012   THYROIDECTOMY, PARTIAL  1988   "then did iodine to remove the rest" (12/19/2012)   TONSILLECTOMY  1951?   TOTAL KNEE ARTHROPLASTY Left 01/19/2021   Procedure: TOTAL KNEE ARTHROPLASTY;  Surgeon: Gaynelle Arabian, MD;  Location: [facility];  Service: Orthopedics;  Laterality: Left;  56min   TRANSRECTAL DRAINAGE OF PELVIC ABSCESS  10/27/2012   VAGINAL HYSTERECTOMY  1970's   "still have my ovaries" (12/19/2012)    Family History  Problem  Relation Age of Onset   Heart disease Father    Pneumonia Father    Hypertension Father    Hyperlipidemia Father    Cancer Father        skin   Stroke Father    Alzheimer's disease Mother    Heart disease Mother    Depression Mother    Emphysema Brother        marijuana and cigarettes   Alcohol abuse Brother    Hearing loss Brother    Diabetes Maternal Grandmother    Alzheimer's disease Paternal Grandmother    Cancer Paternal Grandmother  lung?- smoker   Hyperlipidemia Paternal Grandmother    Heart attack Paternal Grandfather    Alcohol abuse Paternal Grandfather    Neurofibromatosis Son        schwanomatosis   Neurofibromatosis Son        swanomatosis   Cancer Paternal Aunt     Allergies  Allergen Reactions   Neomycin-Bacitracin Zn-Polymyx Rash    Polysporin- is tolerated    Niacin Other (See Comments) and Cough    "cough til I threw up" (12/19/2012)   Ciprofloxacin Hives    Got cipro and flagyl at same time, localized hives to IV arm   Flagyl [Metronidazole] Hives    Got cipro and flagyl at same time, localized hives to IV arm    Current Outpatient Medications on File Prior to Visit  Medication Sig Dispense Refill   Black Pepper-Turmeric (TURMERIC CURCUMIN) 04-999 MG CAPS 1 tablet daily.     carboxymethylcellulose (REFRESH PLUS) 0.5 % SOLN Place 1 drop into both eyes 3 (three) times daily as needed (dry eyes).     Cholecalciferol-Vitamin C (VITAMIN D3-VITAMIN C) 1000-500 UNIT-MG CAPS daily.     COVID-19 mRNA vaccine, Moderna, 100 MCG/0.5ML injection INJECT AS DIRECTED .25 mL 0   docusate sodium (COLACE) 100 MG capsule Take 100 mg by mouth daily.     famotidine (PEPCID) 20 MG tablet TAKE 2 TABLETS BY MOUTH EVERY DAY (Patient taking differently: Take 40 mg by mouth at bedtime.) 180 tablet 1   fluticasone (FLONASE ALLERGY RELIEF) 50 MCG/ACT nasal spray PRN (seasonal)     gabapentin (NEURONTIN) 100 MG capsule Take a 300 mg capsule three times a day for two weeks  following surgery.Then take a 300 mg capsule two times a day for two weeks. Then take a 300 mg capsule once a day for two weeks. Then discontinue. 84 capsule 0   hydrochlorothiazide (HYDRODIURIL) 25 MG tablet Take 1 tablet (25 mg total) by mouth daily. 90 tablet 1   levothyroxine (SYNTHROID, LEVOTHROID) 150 MCG tablet Take 150 mcg by mouth daily before breakfast.     losartan (COZAAR) 100 MG tablet TAKE 1 TABLET BY MOUTH EVERYDAY AT BEDTIME (Patient taking differently: Take 100 mg by mouth at bedtime.) 90 tablet 1   meclizine (ANTIVERT) 25 MG tablet take one a day     metoprolol tartrate (LOPRESSOR) 100 MG tablet TAKE 1&1/2 TABLETS BY MOUTH TWICE A DAY 270 tablet 1   naproxen (NAPROSYN) 375 MG tablet Take 1 tablet (375 mg total) by mouth 2 (two) times daily as needed for moderate pain or headache. 60 tablet 3   nitroGLYCERIN (NITROSTAT) 0.4 MG SL tablet PLACE 1 TABLET UNDER THE TONGUE EVERY 5 MINUTES AS NEEDED FOR CHEST PAIN 25 tablet 1   omeprazole (PRILOSEC) 40 MG capsule TAKE 1 CAPSULE BY MOUTH TWICE A DAY (Patient taking differently: Take 40 mg by mouth in the morning and at bedtime.) 180 capsule 1   oxyCODONE (OXY IR/ROXICODONE) 5 MG immediate release tablet Take 1-2 tablets (5-10 mg total) by mouth every 6 (six) hours as needed for severe pain. 42 tablet 0   polyethylene glycol (MIRALAX / GLYCOLAX) 17 g packet Take 17 g by mouth daily.     Psyllium-Calcium (METAMUCIL PLUS CALCIUM) CAPS daily.     rivaroxaban (XARELTO) 10 MG TABS tablet Take 1 tablet (10 mg total) by mouth daily with breakfast. 21 tablet 0   rosuvastatin (CRESTOR) 40 MG tablet TAKE 1 TABLET BY MOUTH EVERY DAY (Patient taking differently: Take 40 mg  by mouth daily.) 90 tablet 1   No current facility-administered medications on file prior to visit.    BP (!) 180/60   Pulse 60   Temp 98.5 F (36.9 C)   Resp 18   Ht 5\' 2"  (1.575 m)   Wt 198 lb (89.8 kg)   SpO2 97%   BMI 36.21 kg/m       Objective:   Physical  Exam   General Mental Status- Alert. General Appearance- Not in acute distress.   Skin General: Color- Normal Color. Moisture- Normal Moisture.  Neck Carotid Arteries- Normal color. Moisture- Normal Moisture. No carotid bruits. No JVD.  Chest and Lung Exam Auscultation: Breath Sounds:-Normal.  Cardiovascular Auscultation:Rythm- Regular. Murmurs & Other Heart Sounds:Auscultation of the heart reveals- No Murmurs.  Abdomen Inspection:-Inspeection Normal. Palpation/Percussion:Note:No mass. Palpation and Percussion of the abdomen reveal- Non Tender, Non Distended + BS, no rebound or guarding.    Neurologic Cranial Nerve exam:- CN III-XII intact(No nystagmus), symmetric smile. Drift Test:- No drift. Romberg Exam:- Negative.  Heal to Toe Gait exam:-Normal. Finger to Nose:- Normal/Intact Strength:- 5/5 equal and symmetric strength both upper and lower extremities.    Left shoulder pain on palpation and range.  Left upper arm is mild swollen compared to rt side. Left shoulder- pain on Rom and palpation but mild cmpared to rt side.  Back- upper t spine tenderness to palpation.      Assessment & Plan:   Recent fall 2 weeks ago with persisting pain in left shoulder, rightshoulder and left upper extremity/humerus and upper T-spine region.  Left shoulder x-ray and neck imaging studies in the emergency department were negative.  Will get x-ray of left,  humerus  and  thoracic spine.  In addition decided to get left upper extremity ultrasound since that area is swollen.  Want to rule out DVT.  Continue Naprosyn twice daily.  Can supplement with Tylenol as you have been doing.  You have limited number of oxycodone presently and can use that pending x-ray results.  If x-ray results are negative then I think Norco would be more reasonable option for pain control.  Likely less side effects as well.  Blood pressure is mild high today.  Some of elevation might be related to pain.   Continue losartan and HCTZ.  Do recommend that you get blood pressure cuff over-the-counter and check blood pressures daily.  If blood pressures over 150/90 then would need to consider adding additional medication.  Ideally would like to see her blood pressure less than 140/90.  Follow-up in 7 to 10 days or as needed.  Note if studies are negative and pain persisting would consider referral to sports medicine.  Mackie Pai, PA-C   Time spent with patient today was  34  minutes which consisted of chart revdiew, discussing diagnosis, work up treatment and documentation.

## 2021-05-25 NOTE — Patient Instructions (Signed)
Recent fall 2 weeks ago with persisting pain in left shoulder, rightshoulder and left upper extremity/humerus and upper T-spine region.  Left shoulder x-ray and neck imaging studies in the emergency department were negative.  Will get x-ray of left,  humerus  and  thoracic spine.  In addition decided to get left upper extremity ultrasound since that area is swollen.  Want to rule out DVT.  Continue Naprosyn twice daily.  Can supplement with Tylenol as you have been doing.  You have limited number of oxycodone presently and can use that pending x-ray results.  If x-ray results are negative then I think Norco would be more reasonable option for pain control.  Likely less side effects as well.  Blood pressure is mild high today.  Some of elevation might be related to pain.  Continue losartan and HCTZ.  Do recommend that you get blood pressure cuff over-the-counter and check blood pressures daily.  If blood pressures over 150/90 then would need to consider adding additional medication.  Ideally would like to see her blood pressure less than 140/90.  Follow-up in 7 to 10 days or as needed.  Note if studies are negative and pain persisting would consider referral to sports medicine.

## 2021-05-26 ENCOUNTER — Ambulatory Visit (HOSPITAL_BASED_OUTPATIENT_CLINIC_OR_DEPARTMENT_OTHER)
Admission: RE | Admit: 2021-05-26 | Discharge: 2021-05-26 | Disposition: A | Discharge status: Home / Self Care | Payer: Medicare HMO | Source: Ambulatory Visit | Attending: Medical | Admitting: Medical

## 2021-05-26 DIAGNOSIS — M19012 Primary osteoarthritis, left shoulder: Secondary | ICD-10-CM | POA: Diagnosis not present

## 2021-05-26 DIAGNOSIS — M79602 Pain in left arm: Secondary | ICD-10-CM

## 2021-05-26 DIAGNOSIS — M25511 Pain in right shoulder: Secondary | ICD-10-CM | POA: Diagnosis not present

## 2021-05-26 DIAGNOSIS — M546 Pain in thoracic spine: Secondary | ICD-10-CM

## 2021-05-27 NOTE — Progress Notes (Signed)
Subjective:    Patient ID: Jackie Stevens, female    DOB: 1946-12-01, 75 y.o.   MRN: [id number]  HPI followed for obstructive sleep apnea complicated by GERD, HBP, allergic rhinitis, obesity NPSG  07/15/10--AHI 13.5/hour, desaturation to 87%, body weight 215 pounds PSG   ---------------------------------------------------------------------------------  05/29/2019- Virtual Visit via Telephone Note History of Present Illness:  75 year old female never smoker followed for OSA, complicated by chronic cough, GERD, HBP, allergic rhinitis CPAP auto 5-15/[facility]  Replaced CPAP machine 4/ 2020 -----OSA on CPAP; DME: [facility], states she is using it every night, no complaints She reports doing very well with no problems using her replacement CPAP machine. Settings are comfortable.  Wakes occasionally at 4:00AM, too late to take a sleep aide. Discussed sleep hygiene, occasional nap.  Observations/Objective:   Assessment and Plan: OSA- good compliance and control by report. Can't access download card with televisit. Continue auto 5-15  Follow Up Instructions: 1 year  05/28/21-  75 year old female never smoker followed for OSA, complicated by chronic cough, GERD, HBP, allergic rhinitis, Dementia,  CPAP auto 5-15/[facility] / [facility] Replaced CPAP machine 4/ 2020 Download- compliance 100%, 1.1/ hr      Husband Jackie Stevens is in hosp w respiratory failure Body weight today-195 lbs Covid vax- 3 Moderna We were referring for mask fitting, and she was going to try a helmet liner to protect her hair. -----No current respiratory concerns, does report cpap will occasionally "say words" and wake her up.  She is here with her son today. She volunteers that she has some dementia and she asks if this might be why she thinks she hears her CPAP "talking" sometimes at night (eg. Calling "Jackie Stevens"). Machine is abut 75 years old. It is comfortable and works well. She denies daytime breathing concerns.  Download reviewed with her.    Review of Systems-see HPI + = positive Constitutional:   No-   weight loss, night sweats, fevers, chills, fatigue, lassitude. HEENT:   No-  headaches, difficulty swallowing, tooth/dental problems, sore throat,         No-sneezing, itching, ear ache,no- nasal congestion, post nasal drip,  CV:  No-   chest pain, orthopnea, PND, swelling in lower extremities, anasarca, dizziness, palpitations Resp: No-   shortness of breath with exertion or at rest.              No-   productive cough,   non-productive cough,  No-  coughing up of blood.              No-   change in color of mucus.  No- wheezing.   Skin: No-   rash or lesions. GI:  No-   heartburn, indigestion, abdominal pain, nausea, vomiting,  GU:  MS:  No-   joint pain or swelling.   Neuro- +dementia Psych:  No- change in mood or affect. No depression or anxiety.  No memory loss.   Objective:   Physical Exam General- Alert, Oriented, Affect-appropriate, Distress- none acute, + Morbidly obese Skin- rash-none, lesions- none, excoriation- none Lymphadenopathy- none Head- atraumatic            Eyes- Gross vision intact, PERRLA, conjunctivae clear secretions            Ears- Hearing, canals slightly retracted, no fluid            Nose- Clear, No-Septal dev, mucus, polyps, erosion, perforation             Throat- Mallampati III-IV , mucosa clear- not  red , drainage- none, tonsils- atrophic, not hoarse Neck- flexible , trachea midline, no stridor , thyroid nl, carotid no bruit Chest - symmetrical excursion , unlabored           Heart/CV- RRR , murmur+ 1/6 LUSB , no gallop  , no rub, nl s1 s2                           - JVD- none , edema- none, stasis changes- none, varices- none           Lung- clear to P&A, wheeze- none, cough- none , dullness-none, rub- none           Chest wall-  Abd-  Br/ Gen/ Rectal- Not done, not indicated Extrem- cyanosis- none, clubbing, none, atrophy- none, strength- nl Neuro- grossly intact to  observation        Assessment & Plan:

## 2021-05-28 ENCOUNTER — Encounter: Payer: Self-pay | Admitting: Internal Medicine

## 2021-05-28 ENCOUNTER — Ambulatory Visit: Payer: Medicare HMO | Admitting: Internal Medicine

## 2021-05-28 ENCOUNTER — Other Ambulatory Visit: Payer: Self-pay

## 2021-05-28 VITALS — BP 122/74 | HR 63 | Temp 98.0°F | Ht 63.0 in | Wt 195.2 lb

## 2021-05-28 DIAGNOSIS — G4733 Obstructive sleep apnea (adult) (pediatric): Secondary | ICD-10-CM | POA: Diagnosis not present

## 2021-05-28 NOTE — Assessment & Plan Note (Addendum)
Benefits from CPAP. Suspect with her dementia, she is either having auditory hallucination, or confusional misinterpretation of working noises from her CPAP. Son is going to arrange recording/ monitoring to see if sounds can be identified as she sleeps.  Plan- continue auto 5-15. Service CPAP to exclude mechanical problem.

## 2021-05-28 NOTE — Assessment & Plan Note (Signed)
Encourage diet and exercise. 

## 2021-05-28 NOTE — Patient Instructions (Signed)
Order- DME [facility]/ [facility]-    please service CPAP machine    making noises  We can continue auto 5-15 cwp , mask of choice, humidifier, supplies, AirView/ card   Please call if we can help

## 2021-05-29 ENCOUNTER — Telehealth: Payer: Self-pay | Admitting: Family Medicine

## 2021-05-29 NOTE — Telephone Encounter (Signed)
Copied from [place] [id number]. Topic: Medicare AWV >> May 29, 2021 10:30 AM Harris-Coley, Hannah Beat wrote: Reason for CRM: Left message for patient to schedule Annual Wellness Visit.  Please schedule with Health Nurse Advisor Augustine Radar. at [facility].

## 2021-06-02 ENCOUNTER — Other Ambulatory Visit: Payer: Self-pay | Admitting: Family Medicine

## 2021-06-04 DIAGNOSIS — G4733 Obstructive sleep apnea (adult) (pediatric): Secondary | ICD-10-CM | POA: Diagnosis not present

## 2021-06-08 ENCOUNTER — Telehealth: Payer: Self-pay | Admitting: Family Medicine

## 2021-06-08 NOTE — Telephone Encounter (Signed)
Attempted to schedule AWV. Unable to LVM.  Will try at later time.  

## 2021-06-16 ENCOUNTER — Ambulatory Visit: Payer: Medicare HMO | Admitting: Podiatry

## 2021-06-19 DIAGNOSIS — G4733 Obstructive sleep apnea (adult) (pediatric): Secondary | ICD-10-CM | POA: Diagnosis not present

## 2021-06-26 ENCOUNTER — Other Ambulatory Visit: Payer: Self-pay | Admitting: Family Medicine

## 2021-07-08 ENCOUNTER — Other Ambulatory Visit: Payer: Self-pay | Admitting: Neurology

## 2021-07-08 ENCOUNTER — Other Ambulatory Visit: Payer: Self-pay | Admitting: Family Medicine

## 2021-07-08 DIAGNOSIS — R059 Cough, unspecified: Secondary | ICD-10-CM

## 2021-07-08 DIAGNOSIS — J302 Other seasonal allergic rhinitis: Secondary | ICD-10-CM

## 2021-07-08 DIAGNOSIS — K219 Gastro-esophageal reflux disease without esophagitis: Secondary | ICD-10-CM

## 2021-07-13 ENCOUNTER — Ambulatory Visit: Payer: Medicare HMO | Admitting: Cardiovascular Disease

## 2021-07-19 ENCOUNTER — Other Ambulatory Visit: Payer: Self-pay | Admitting: Family Medicine

## 2021-07-20 DIAGNOSIS — G4733 Obstructive sleep apnea (adult) (pediatric): Secondary | ICD-10-CM | POA: Diagnosis not present

## 2021-07-24 ENCOUNTER — Ambulatory Visit: Payer: Medicare HMO | Admitting: Podiatry

## 2021-07-29 ENCOUNTER — Other Ambulatory Visit: Payer: Self-pay | Admitting: Family Medicine

## 2021-08-05 ENCOUNTER — Telehealth: Payer: Self-pay | Admitting: Internal Medicine

## 2021-08-05 NOTE — Telephone Encounter (Signed)
Spoke with patient's daughter in law [place]. She stated that the patient has been having issues with sleeping at night. She has been waking up every morning around 4am and not able to fall back to sleep. This has been going on for the past few weeks. She wanted to know if the patient could have something called in to help to sleep at night.   Pharmacy is [facility] in [place].   Dr. Annamaria Boots, can you please advise? Thanks!

## 2021-08-06 MED ORDER — TEMAZEPAM 15 MG PO CAPS: 15.0000 mg | ORAL_CAPSULE | Freq: Every evening | ORAL | 1 refills | Status: DC | PRN

## 2021-08-06 NOTE — Telephone Encounter (Signed)
Script sent to try temazepam - 1 at bedtime as needed for sleep

## 2021-08-06 NOTE — Telephone Encounter (Signed)
Called and spoke with patient's son Merry Proud. He was made aware of the temazepam and verbalized understanding.   Nothing further needed at time of call.

## 2021-08-11 ENCOUNTER — Other Ambulatory Visit: Payer: Self-pay | Admitting: Family Medicine

## 2021-08-20 DIAGNOSIS — G4733 Obstructive sleep apnea (adult) (pediatric): Secondary | ICD-10-CM | POA: Diagnosis not present

## 2021-08-27 DIAGNOSIS — R2681 Unsteadiness on feet: Secondary | ICD-10-CM | POA: Diagnosis not present

## 2021-08-27 DIAGNOSIS — E785 Hyperlipidemia, unspecified: Secondary | ICD-10-CM | POA: Diagnosis not present

## 2021-08-27 DIAGNOSIS — R7302 Impaired glucose tolerance (oral): Secondary | ICD-10-CM | POA: Diagnosis not present

## 2021-08-27 DIAGNOSIS — I5189 Other ill-defined heart diseases: Secondary | ICD-10-CM | POA: Diagnosis not present

## 2021-08-27 DIAGNOSIS — I10 Essential (primary) hypertension: Secondary | ICD-10-CM | POA: Diagnosis not present

## 2021-08-27 DIAGNOSIS — I700 Atherosclerosis of aorta: Secondary | ICD-10-CM | POA: Diagnosis not present

## 2021-08-27 DIAGNOSIS — G4730 Sleep apnea, unspecified: Secondary | ICD-10-CM | POA: Diagnosis not present

## 2021-08-27 DIAGNOSIS — Z23 Encounter for immunization: Secondary | ICD-10-CM | POA: Diagnosis not present

## 2021-08-27 DIAGNOSIS — G3184 Mild cognitive impairment, so stated: Secondary | ICD-10-CM | POA: Diagnosis not present

## 2021-08-27 DIAGNOSIS — E890 Postprocedural hypothyroidism: Secondary | ICD-10-CM | POA: Diagnosis not present

## 2021-08-27 DIAGNOSIS — C73 Malignant neoplasm of thyroid gland: Secondary | ICD-10-CM | POA: Diagnosis not present

## 2021-09-06 ENCOUNTER — Other Ambulatory Visit: Payer: Self-pay | Admitting: Internal Medicine

## 2021-09-07 NOTE — Telephone Encounter (Signed)
Temazepam refilled.

## 2021-09-07 NOTE — Telephone Encounter (Signed)
Dr. Annamaria Boots, please advise if you are okay refilling med.  Allergies  Allergen Reactions   Neomycin-Bacitracin Zn-Polymyx Rash    Polysporin- is tolerated    Niacin Other (See Comments) and Cough    "cough til I threw up" (12/19/2012)   Ciprofloxacin Hives    Got cipro and flagyl at same time, localized hives to IV arm   Flagyl [Metronidazole] Hives    Got cipro and flagyl at same time, localized hives to IV arm    Current Outpatient Medications:    Azelastine HCl 137 MCG/SPRAY SOLN, PLACE 2 SPRAYS INTO BOTH NOSTRILS 2 (TWO) TIMES DAILY., Disp: 90 mL, Rfl: 4   Black Pepper-Turmeric (TURMERIC CURCUMIN) 04-999 MG CAPS, 1 tablet daily., Disp: , Rfl:    carboxymethylcellulose (REFRESH PLUS) 0.5 % SOLN, Place 1 drop into both eyes 3 (three) times daily as needed (dry eyes)., Disp: , Rfl:    Cholecalciferol-Vitamin C (VITAMIN D3-VITAMIN C) 1000-500 UNIT-MG CAPS, daily., Disp: , Rfl:    COVID-19 mRNA vaccine, Moderna, 100 MCG/0.5ML injection, INJECT AS DIRECTED (Patient not taking: Reported on 05/28/2021), Disp: .25 mL, Rfl: 0   docusate sodium (COLACE) 100 MG capsule, Take 100 mg by mouth daily., Disp: , Rfl:    famotidine (PEPCID) 20 MG tablet, TAKE 2 TABLETS BY MOUTH EVERY DAY, Disp: 180 tablet, Rfl: 1   fluticasone (FLONASE) 50 MCG/ACT nasal spray, PRN (seasonal), Disp: , Rfl:    gabapentin (NEURONTIN) 100 MG capsule, Take a 300 mg capsule three times a day for two weeks following surgery.Then take a 300 mg capsule two times a day for two weeks. Then take a 300 mg capsule once a day for two weeks. Then discontinue., Disp: 84 capsule, Rfl: 0   hydrochlorothiazide (HYDRODIURIL) 25 MG tablet, TAKE 1 TABLET (25 MG TOTAL) BY MOUTH DAILY., Disp: 90 tablet, Rfl: 1   levothyroxine (SYNTHROID, LEVOTHROID) 150 MCG tablet, Take 150 mcg by mouth daily before breakfast., Disp: , Rfl:    losartan (COZAAR) 100 MG tablet, TAKE 1 TABLET BY MOUTH EVERYDAY AT BEDTIME, Disp: 90 tablet, Rfl: 1   meclizine (ANTIVERT)  25 MG tablet, take one a day, Disp: , Rfl:    metoprolol tartrate (LOPRESSOR) 100 MG tablet, TAKE 1&1/2 TABLETS BY MOUTH TWICE A DAY, Disp: 270 tablet, Rfl: 1   naproxen (NAPROSYN) 375 MG tablet, TAKE 1 TABLET (375 MG TOTAL) BY MOUTH 2 (TWO) TIMES DAILY AS NEEDED FOR MODERATE PAIN OR HEADACHE., Disp: 60 tablet, Rfl: 3   nitroGLYCERIN (NITROSTAT) 0.4 MG SL tablet, PLACE 1 TABLET UNDER THE TONGUE EVERY 5 MINUTES AS NEEDED FOR CHEST PAIN, Disp: 25 tablet, Rfl: 1   omeprazole (PRILOSEC) 40 MG capsule, TAKE 1 CAPSULE BY MOUTH TWICE A DAY, Disp: 180 capsule, Rfl: 1   oxyCODONE (OXY IR/ROXICODONE) 5 MG immediate release tablet, Take 1-2 tablets (5-10 mg total) by mouth every 6 (six) hours as needed for severe pain., Disp: 42 tablet, Rfl: 0   polyethylene glycol (MIRALAX / GLYCOLAX) 17 g packet, Take 17 g by mouth daily., Disp: , Rfl:    Psyllium-Calcium (METAMUCIL PLUS CALCIUM) CAPS, daily., Disp: , Rfl:    rivaroxaban (XARELTO) 10 MG TABS tablet, Take 1 tablet (10 mg total) by mouth daily with breakfast., Disp: 21 tablet, Rfl: 0   rosuvastatin (CRESTOR) 40 MG tablet, TAKE 1 TABLET BY MOUTH EVERY DAY, Disp: 90 tablet, Rfl: 1   temazepam (RESTORIL) 15 MG capsule, Take 1 capsule (15 mg total) by mouth at bedtime as needed for sleep., Disp:  30 capsule, Rfl: 1

## 2021-09-09 DIAGNOSIS — Z1231 Encounter for screening mammogram for malignant neoplasm of breast: Secondary | ICD-10-CM | POA: Diagnosis not present

## 2021-09-09 LAB — HM MAMMOGRAPHY

## 2021-09-10 ENCOUNTER — Encounter: Payer: Self-pay | Admitting: *Deleted

## 2021-09-15 ENCOUNTER — Ambulatory Visit (INDEPENDENT_AMBULATORY_CARE_PROVIDER_SITE_OTHER): Payer: Medicare HMO | Admitting: Family Medicine

## 2021-09-15 ENCOUNTER — Ambulatory Visit (HOSPITAL_BASED_OUTPATIENT_CLINIC_OR_DEPARTMENT_OTHER)
Admission: RE | Admit: 2021-09-15 | Discharge: 2021-09-15 | Disposition: A | Discharge status: Home / Self Care | Payer: Medicare HMO | Source: Ambulatory Visit | Attending: Family Medicine | Admitting: Family Medicine

## 2021-09-15 ENCOUNTER — Other Ambulatory Visit: Payer: Self-pay

## 2021-09-15 VITALS — BP 116/72 | HR 65 | Temp 98.2°F | Resp 16 | Wt 194.2 lb

## 2021-09-15 DIAGNOSIS — R739 Hyperglycemia, unspecified: Secondary | ICD-10-CM

## 2021-09-15 DIAGNOSIS — Z0000 Encounter for general adult medical examination without abnormal findings: Secondary | ICD-10-CM | POA: Diagnosis not present

## 2021-09-15 DIAGNOSIS — I10 Essential (primary) hypertension: Secondary | ICD-10-CM | POA: Diagnosis not present

## 2021-09-15 DIAGNOSIS — G3184 Mild cognitive impairment, so stated: Secondary | ICD-10-CM

## 2021-09-15 DIAGNOSIS — F0394 Unspecified dementia, unspecified severity, with anxiety: Secondary | ICD-10-CM | POA: Diagnosis not present

## 2021-09-15 DIAGNOSIS — R69 Illness, unspecified: Secondary | ICD-10-CM | POA: Diagnosis not present

## 2021-09-15 DIAGNOSIS — Z23 Encounter for immunization: Secondary | ICD-10-CM

## 2021-09-15 DIAGNOSIS — M25551 Pain in right hip: Secondary | ICD-10-CM | POA: Insufficient documentation

## 2021-09-15 DIAGNOSIS — E559 Vitamin D deficiency, unspecified: Secondary | ICD-10-CM | POA: Diagnosis not present

## 2021-09-15 NOTE — Patient Instructions (Addendum)
-Consider taking 1 calcium citric or citrate tablet daily  -lidocaine cream or icing for left knee pain  -Paxlovid and/or molnupiravir is the new COVID medication we can give you if you get COVID so make sure you test if you have symptoms because we have to treat by day 5 of symptoms for it to be effective. If you are positive let us know so we can treat. If a home test is negative and your symptoms are persistent get a PCR test. Can check testing locations at [email or web address] If you are positive we will make an appointment with [place] and we will send in Paxlovid/molnupiravir if you would like it. Check with your pharmacy before we meet to confirm they have it in stock, if they do not then we can get the prescription at the [facility].  Preventive Care 77 Years and Older, Female Preventive care refers to lifestyle choices and visits with your health care provider that can promote health and wellness. This includes: A yearly physical exam. This is also called an annual wellness visit. Regular dental and eye exams. Immunizations. Screening for certain conditions. Healthy lifestyle choices, such as: Eating a healthy diet. Getting regular exercise. Not using drugs or products that contain nicotine and tobacco. Limiting alcohol use. What can I expect for my preventive care visit? Physical exam Your health care provider will check your: Height and weight. These may be used to calculate your BMI (body mass index). BMI is a measurement that tells if you are at a healthy weight. Heart rate and blood pressure. Body temperature. Skin for abnormal spots. Counseling Your health care provider may ask you questions about your: Past medical problems. Family's medical history. Alcohol, tobacco, and drug use. Emotional well-being. Home life and relationship well-being. Sexual activity. Diet, exercise, and sleep habits. History of falls. Memory and ability to understand  (cognition). Work and work Statistician. Pregnancy and menstrual history. Access to firearms. What immunizations do I need? Vaccines are usually given at various ages, according to a schedule. Your health care provider will recommend vaccines for you based on your age, medical history, and lifestyle or other factors, such as travel or where you work. What tests do I need? Blood tests Lipid and cholesterol levels. These may be checked every 5 years, or more often depending on your overall health. Hepatitis C test. Hepatitis B test. Screening Lung cancer screening. You may have this screening every year starting at age 50 if you have a 30-pack-year history of smoking and currently smoke or have quit within the past 15 years. Colorectal cancer screening. All adults should have this screening starting at age 73 and continuing until age 52. Your health care provider may recommend screening at age 66 if you are at increased risk. You will have tests every 1-10 years, depending on your results and the type of screening test. Diabetes screening. This is done by checking your blood sugar (glucose) after you have not eaten for a while (fasting). You may have this done every 1-3 years. Mammogram. This may be done every 1-2 years. Talk with your health care provider about how often you should have regular mammograms. Abdominal aortic aneurysm (AAA) screening. You may need this if you are a current or former smoker. BRCA-related cancer screening. This may be done if you have a family history of breast, ovarian, tubal, or peritoneal cancers. Other tests STD (sexually transmitted disease) testing, if you are at risk. Bone density scan. This is done to screen for  osteoporosis. You may have this done starting at age 95. Talk with your health care provider about your test results, treatment options, and if necessary, the need for more tests. Follow these instructions at home: Eating and drinking  Eat a  diet that includes fresh fruits and vegetables, whole grains, lean protein, and low-fat dairy products. Limit your intake of foods with high amounts of sugar, saturated fats, and salt. Take vitamin and mineral supplements as recommended by your health care provider. Do not drink alcohol if your health care provider tells you not to drink. If you drink alcohol: Limit how much you have to 0-1 drink a day. Be aware of how much alcohol is in your drink. In the [place] one drink equals one 12 oz bottle of beer (355 mL), one 5 oz glass of wine (148 mL), or one 1 oz glass of hard liquor (44 mL). Lifestyle Take daily care of your teeth and gums. Brush your teeth every morning and night with fluoride toothpaste. Floss one time each day. Stay active. Exercise for at least 30 minutes 5 or more days each week. Do not use any products that contain nicotine or tobacco, such as cigarettes, e-cigarettes, and chewing tobacco. If you need help quitting, ask your health care provider. Do not use drugs. If you are sexually active, practice safe sex. Use a condom or other form of protection in order to prevent STIs (sexually transmitted infections). Talk with your health care provider about taking a low-dose aspirin or statin. Find healthy ways to cope with stress, such as: Meditation, yoga, or listening to music. Journaling. Talking to a trusted person. Spending time with friends and family. Safety Always wear your seat belt while driving or riding in a vehicle. Do not drive: If you have been drinking alcohol. Do not ride with someone who has been drinking. When you are tired or distracted. While texting. Wear a helmet and other protective equipment during sports activities. If you have firearms in your house, make sure you follow all gun safety procedures. What's next? Visit your health care provider once a year for an annual wellness visit. Ask your health care provider how often you should have your eyes  and teeth checked. Stay up to date on all vaccines. This information is not intended to replace advice given to you by your health care provider. Make sure you discuss any questions you have with your health care provider. Document Revised: 02/06/2021 Document Reviewed: 11/23/2018 Elsevier Patient Education  2022 [employer].

## 2021-09-15 NOTE — Progress Notes (Signed)
Patient ID: Jackie Stevens, female    DOB: 09-Mar-1946  Age: 75 y.o. MRN: [id number]    Subjective:   No chief complaint on file.  Subjective   HPI Jackie Stevens presents for office visit today for annual preventative exam and follow up on HTN and hypothyroidism. She fell out of bed last Wednesday on her right hip. Her right hip hurts when she walks. Denies CP/palp/SOB/congestion/fevers or GU c/o. Taking meds as prescribed. She has been having more frequent HA's at night since her husband passed away. She experiences nausea when driving. She is accompanied by her son who now lives with her and helps her with activities of daily living. They report she is able to manage at home with the family assistance. No recent febrile illness or hospitalizations.   Review of Systems  Constitutional:  Negative for chills, fatigue and fever.  HENT:  Negative for congestion, rhinorrhea, sinus pressure, sinus pain, sore throat and trouble swallowing.   Eyes:  Negative for pain and visual disturbance.  Respiratory:  Negative for cough and shortness of breath.   Cardiovascular:  Negative for chest pain, palpitations and leg swelling.  Gastrointestinal:  Positive for nausea. Negative for abdominal pain, blood in stool, diarrhea and vomiting.  Genitourinary:  Negative for decreased urine volume, flank pain, frequency, vaginal bleeding and vaginal discharge.  Musculoskeletal:  Negative for back pain.  Neurological:  Positive for headaches (tension).   History Past Medical History:  Diagnosis Date   Abnormal cervical cytology 10/25/2012   Follows with Dr Leavy Cella of Gyn   Allergic rhinitis 11/04/2010   Qualifier: Diagnosis of  By: Annamaria Boots MD, Clinton D    Anemia 10/06/2013   Anginal pain    pt has history of CP states had cardiac workup with no specific issues identified    Arthritis    knees; right thumb; shoulders   Arthritis of left shoulder region 09/30/2016   X-ray of the left shoulder on  09/14/2016: Marked degenerative change with significant osteophytic spurring and subchondral sclerosis.  Loss of glenohumeral joint space.  Well-maintained subacromial space.  Injected 09/30/2016 [name] .   Asthma, mild intermittent 04/02/2013   Chicken pox as a child   Colonic diverticular abscess 11/21/2012   Constipation    Decreased hearing    Dermatitis 03/06/2017   Diverticulitis 10/25/2012   pt. reports that a drain was placed - 09/2012     Diverticulitis of rectosigmoid 11/28/2012   Dry mouth    Essential hypertension 11/08/2010   Qualifier: Diagnosis of  By: Annamaria Boots MD, Clinton D    Excessive thirst    External hemorrhoid, bleeding    "sometimes" (10-Jan-2013)   Frequent urination    GERD (gastroesophageal reflux disease)    H/O hiatal hernia    Hand tingling    Heart murmur    History of kidney stones    Hyperglycemia 01/17/2020   Hyperlipidemia    Hypothyroidism    Incontinence    Insomnia    Kidney stones 1970's   "passed on their own" (10-Jan-2013)   Low back pain 06/09/2014   Measles as a child   Mild neurocognitive disorder 02/05/2020   Obesity 09/11/2017   Obstructive sleep apnea 11/04/2010   NPSG [facility] 07/15/10- AHI 13.5/ hr CPAP 10/APS     Otitis media 01/07/2014   Palpitations    Paresthesia 03/23/2015   Left face   PONV (postoperative nausea and vomiting)    Rheumatoid arteritis    Shortness of breath dyspnea  using stairs   Sinus pain    Swelling of both lower extremities    Thyroid cancer 1980's   Tinnitus    UTI (urinary tract infection) 04/02/2013   Vertigo 08/05/2019   Vitamin D deficiency 02/28/2018    She has a past surgical history that includes Thyroidectomy, partial (1988); Sigmoid resection / rectopexy (12/19/2012); Colon surgery; Tonsillectomy (1951?); Cholecystectomy (1990); Vaginal hysterectomy (1970's); Dilation and curettage of uterus (1960's); Transrectal drainage of pelvic abscess (10/27/2012); Colostomy revision (12/19/2012); Cystoscopy with stent  placement (12/19/2012); Lysis of adhesion (N/A, 12/02/2015); Robotic assisted bilateral salpingo oophorectomy (Bilateral, 12/02/2015); and Total knee arthroplasty (Left, 01/19/2021).   Her family history includes Alcohol abuse in her brother and paternal grandfather; Alzheimer's disease in her mother and paternal grandmother; Cancer in her father, paternal aunt, and paternal grandmother; Depression in her mother; Diabetes in her maternal grandmother; Emphysema in her brother; Hearing loss in her brother; Heart attack in her paternal grandfather; Heart disease in her father and mother; Hyperlipidemia in her father and paternal grandmother; Hypertension in her father; Neurofibromatosis in her son and son; Pneumonia in her father; Stroke in her father.She reports that she has never smoked. She has never used smokeless tobacco. She reports that she does not drink alcohol and does not use drugs.  Current Outpatient Medications on File Prior to Visit  Medication Sig Dispense Refill   Azelastine HCl 137 MCG/SPRAY SOLN PLACE 2 SPRAYS INTO BOTH NOSTRILS 2 (TWO) TIMES DAILY. 90 mL 4   Black Pepper-Turmeric (TURMERIC CURCUMIN) 04-999 MG CAPS 1 tablet daily.     carboxymethylcellulose (REFRESH PLUS) 0.5 % SOLN Place 1 drop into both eyes 3 (three) times daily as needed (dry eyes).     Cholecalciferol-Vitamin C (VITAMIN D3-VITAMIN C) 1000-500 UNIT-MG CAPS daily.     COVID-19 mRNA vaccine, Moderna, 100 MCG/0.5ML injection INJECT AS DIRECTED (Patient not taking: Reported on 05/28/2021) .25 mL 0   docusate sodium (COLACE) 100 MG capsule Take 100 mg by mouth daily.     famotidine (PEPCID) 20 MG tablet TAKE 2 TABLETS BY MOUTH EVERY DAY 180 tablet 1   fluticasone (FLONASE) 50 MCG/ACT nasal spray PRN (seasonal)     gabapentin (NEURONTIN) 100 MG capsule Take a 300 mg capsule three times a day for two weeks following surgery.Then take a 300 mg capsule two times a day for two weeks. Then take a 300 mg capsule once a day for two  weeks. Then discontinue. 84 capsule 0   hydrochlorothiazide (HYDRODIURIL) 25 MG tablet TAKE 1 TABLET (25 MG TOTAL) BY MOUTH DAILY. 90 tablet 1   levothyroxine (SYNTHROID, LEVOTHROID) 150 MCG tablet Take 150 mcg by mouth daily before breakfast.     losartan (COZAAR) 100 MG tablet TAKE 1 TABLET BY MOUTH EVERYDAY AT BEDTIME 90 tablet 1   meclizine (ANTIVERT) 25 MG tablet take one a day     metoprolol tartrate (LOPRESSOR) 100 MG tablet TAKE 1&1/2 TABLETS BY MOUTH TWICE A DAY 270 tablet 1   naproxen (NAPROSYN) 375 MG tablet TAKE 1 TABLET (375 MG TOTAL) BY MOUTH 2 (TWO) TIMES DAILY AS NEEDED FOR MODERATE PAIN OR HEADACHE. 60 tablet 3   nitroGLYCERIN (NITROSTAT) 0.4 MG SL tablet PLACE 1 TABLET UNDER THE TONGUE EVERY 5 MINUTES AS NEEDED FOR CHEST PAIN 25 tablet 1   omeprazole (PRILOSEC) 40 MG capsule TAKE 1 CAPSULE BY MOUTH TWICE A DAY 180 capsule 1   oxyCODONE (OXY IR/ROXICODONE) 5 MG immediate release tablet Take 1-2 tablets (5-10 mg total) by mouth every  6 (six) hours as needed for severe pain. 42 tablet 0   polyethylene glycol (MIRALAX / GLYCOLAX) 17 g packet Take 17 g by mouth daily.     Psyllium-Calcium (METAMUCIL PLUS CALCIUM) CAPS daily.     rivaroxaban (XARELTO) 10 MG TABS tablet Take 1 tablet (10 mg total) by mouth daily with breakfast. 21 tablet 0   rosuvastatin (CRESTOR) 40 MG tablet TAKE 1 TABLET BY MOUTH EVERY DAY 90 tablet 1   temazepam (RESTORIL) 15 MG capsule TAKE 1 CAPSULE BY MOUTH AT BEDTIME AS NEEDED FOR SLEEP. 30 capsule 5   No current facility-administered medications on file prior to visit.     Objective:  Objective  Physical Exam Constitutional:      General: She is not in acute distress.    Appearance: Normal appearance. She is not ill-appearing or toxic-appearing.  HENT:     Head: Normocephalic and atraumatic.     Right Ear: Tympanic membrane, ear canal and external ear normal.     Left Ear: Tympanic membrane, ear canal and external ear normal.     Nose: No congestion  or rhinorrhea.  Eyes:     Extraocular Movements: Extraocular movements intact.     Right eye: No nystagmus.     Left eye: No nystagmus.     Pupils: Pupils are equal, round, and reactive to light.  Cardiovascular:     Rate and Rhythm: Normal rate and regular rhythm.     Pulses: Normal pulses.     Heart sounds: Murmur heard.  Systolic murmur is present with a grade of 1/6.  Pulmonary:     Effort: Pulmonary effort is normal. No respiratory distress.     Breath sounds: Normal breath sounds. No wheezing, rhonchi or rales.  Abdominal:     General: Bowel sounds are normal.     Palpations: Abdomen is soft. There is no mass.     Tenderness: There is no abdominal tenderness. There is no guarding.     Hernia: No hernia is present.  Musculoskeletal:        General: Normal range of motion.     Cervical back: Normal range of motion and neck supple.  Skin:    General: Skin is warm and dry.  Neurological:     Mental Status: She is alert and oriented to person, place, and time.     Cranial Nerves: No facial asymmetry.     Motor: Motor function is intact. No weakness.  Psychiatric:        Behavior: Behavior normal.   BP 116/72   Pulse 65   Temp 98.2 F (36.8 C)   Resp 16   Wt 194 lb 3.2 oz (88.1 kg)   SpO2 95%   BMI 34.40 kg/m  Wt Readings from Last 3 Encounters:  09/15/21 194 lb 3.2 oz (88.1 kg)  05/28/21 195 lb 3.2 oz (88.5 kg)  05/25/21 198 lb (89.8 kg)     Lab Results  Component Value Date   WBC 7.3 03/03/2021   HGB 11.8 (L) 03/03/2021   HCT 35.9 (L) 03/03/2021   PLT 322.0 03/03/2021   GLUCOSE 108 (H) 03/03/2021   CHOL 132 03/03/2021   TRIG 87.0 03/03/2021   HDL 54.20 03/03/2021   LDLDIRECT 81.0 02/12/2020   LDLCALC 60 03/03/2021   ALT 15 03/03/2021   AST 16 03/03/2021   NA 135 03/03/2021   K 4.6 03/03/2021   CL 98 03/03/2021   CREATININE 0.74 03/03/2021   BUN 16 03/03/2021  CO2 30 03/03/2021   TSH 0.15 (L) 03/03/2021   INR 1.0 01/12/2021   HGBA1C 5.5  03/03/2021    DG Thoracic Spine 2 View  Result Date: 05/27/2021 CLINICAL DATA:  Fall with midline thoracic pain EXAM: THORACIC SPINE 2 VIEWS COMPARISON:  None. FINDINGS: There is no evidence of thoracic spine fracture. Alignment is normal. Mild multilevel spondylosis. IMPRESSION: No acute abnormality of the thoracic spine. Electronically Signed   By: Ulyses Jarred M.D.   On: 05/27/2021 03:25   DG Shoulder Right  Result Date: 05/27/2021 CLINICAL DATA:  Fall 2 weeks prior, right shoulder pain EXAM: RIGHT SHOULDER - 2+ VIEW COMPARISON:  Radiograph 06/02/2020 FINDINGS: There is no evidence of fracture or dislocation. Mild glenohumeral acromioclavicular arthrosis without evidence of an underlying arthropathy or other focal bone abnormality. Surgical clips noted at the base of the neck, may reflect prior thyroidectomy. Chest wall and included lungs are free of acute abnormality. IMPRESSION: No acute traumatic findings. Minimal degenerative changes. Electronically Signed   By: Lovena Le M.D.   On: 05/27/2021 03:08   DG Humerus Left  Result Date: 05/27/2021 CLINICAL DATA:  Fall with left arm pain EXAM: LEFT HUMERUS - 2+ VIEW COMPARISON:  05/09/2021 left shoulder radiograph FINDINGS: Severe glenohumeral osteoarthrosis with remodeling of the humeral head. No acute fracture or dislocation. Calcifications inferior to the glenoid fossa and humeral head are unchanged. IMPRESSION: No acute fracture or dislocation of the left humerus. Severe glenohumeral osteoarthrosis, unchanged. Electronically Signed   By: Ulyses Jarred M.D.   On: 05/27/2021 03:23     Assessment & Plan:  Plan    Meds ordered this encounter  Medications   DISCONTD: FLUoxetine (PROZAC) 10 MG tablet    Sig: Take 1 tablet (10 mg total) by mouth daily.    Dispense:  30 tablet    Refill:  3     Problem List Items Addressed This Visit     Essential hypertension    Well controlled, no changes to meds. Encouraged heart healthy diet such  as the DASH diet and exercise as tolerated.       Preventative health care    Patient encouraged to maintain heart healthy diet, regular exercise, adequate sleep. Consider daily probiotics. Take medications as prescribed. Labs reviewed. Last colonoscopy 2015 to repeat in 2025. MGM September 2022. Repeat in 1-2 years.      Vitamin D deficiency    Supplement and monitor      Hyperglycemia    hgba1c acceptable, minimize simple carbs. Increase exercise as tolerated.       Mild neurocognitive disorder    Is worsening and patient is increasingly anxious and worried. Is referred back to neurology for further evaluation and work up.       Right hip pain    Check an xray and consider referral for specialty care if worsens. Encouraged moist heat and gentle stretching as tolerated. May try NSAIDs and prescription meds as directed and report if symptoms worsen or seek immediate care      Relevant Orders   DG HIP UNILAT WITH PELVIS 2-3 VIEWS RIGHT   Other Visit Diagnoses     Need for influenza vaccination    -  Primary   Relevant Orders   Flu Vaccine QUAD High Dose(Fluad) (Completed)   Dementia with anxiety, unspecified dementia severity, unspecified dementia type       Relevant Orders   Ambulatory referral to Neurology       Follow-up: Return in about 4 months (  around 01/16/2022) for f/u visit.  I, Suezanne Jacquet, acting as a scribe for Penni Homans, MD, have documented all relevent documentation on behalf of Penni Homans, MD, as directed by Penni Homans, MD while in the presence of Penni Homans, MD. DO:09/16/21.  I, Mosie Lukes, MD personally performed the services described in this documentation. All medical record entries made by the scribe were at my direction and in my presence. I have reviewed the chart and agree that the record reflects my personal performance and is accurate and complete

## 2021-09-15 NOTE — Assessment & Plan Note (Signed)
Encourage heart healthy diet such as MIND or DASH diet, increase exercise, avoid trans fats, simple carbohydrates and processed foods, consider a krill or fish or flaxseed oil cap daily.  °

## 2021-09-15 NOTE — Assessment & Plan Note (Signed)
Supplement and monitor 

## 2021-09-15 NOTE — Assessment & Plan Note (Signed)
Well controlled, no changes to meds. Encouraged heart healthy diet such as the DASH diet and exercise as tolerated.  °

## 2021-09-15 NOTE — Assessment & Plan Note (Addendum)
Patient encouraged to maintain heart healthy diet, regular exercise, adequate sleep. Consider daily probiotics. Take medications as prescribed. Labs reviewed. Last colonoscopy 2015 to repeat in 2025. MGM September 2022. Repeat in 1-2 years.

## 2021-09-15 NOTE — Assessment & Plan Note (Signed)
hgba1c acceptable, minimize simple carbs. Increase exercise as tolerated.  

## 2021-09-16 DIAGNOSIS — M25551 Pain in right hip: Secondary | ICD-10-CM | POA: Insufficient documentation

## 2021-09-16 DIAGNOSIS — M25552 Pain in left hip: Secondary | ICD-10-CM | POA: Insufficient documentation

## 2021-09-16 DIAGNOSIS — F419 Anxiety disorder, unspecified: Secondary | ICD-10-CM | POA: Insufficient documentation

## 2021-09-16 MED ORDER — FLUOXETINE HCL 10 MG PO TABS: 10.0000 mg | ORAL_TABLET | Freq: Every day | ORAL | 3 refills | Status: DC

## 2021-09-16 NOTE — Assessment & Plan Note (Signed)
Is worsening and patient is increasingly anxious and worried. Is referred back to neurology for further evaluation and work up.

## 2021-09-16 NOTE — Assessment & Plan Note (Signed)
Check an xray and consider referral for specialty care if worsens. Encouraged moist heat and gentle stretching as tolerated. May try NSAIDs and prescription meds as directed and report if symptoms worsen or seek immediate care

## 2021-09-19 ENCOUNTER — Other Ambulatory Visit: Payer: Self-pay | Admitting: Family Medicine

## 2021-09-19 DIAGNOSIS — G4733 Obstructive sleep apnea (adult) (pediatric): Secondary | ICD-10-CM | POA: Diagnosis not present

## 2021-10-06 ENCOUNTER — Encounter: Payer: Self-pay | Admitting: Family Medicine

## 2021-10-12 ENCOUNTER — Telehealth: Payer: Self-pay | Admitting: Family Medicine

## 2021-10-12 NOTE — Telephone Encounter (Signed)
Pt. Is still having issues with hip pain and wanted to see about getting referred to a specialist as mentioned in last visit.  Pt is also having issues with teeth chattering especially in the morning. Wanted to see if there is anyway to stop motion.

## 2021-10-13 ENCOUNTER — Other Ambulatory Visit: Payer: Self-pay | Admitting: Family Medicine

## 2021-10-13 DIAGNOSIS — M25551 Pain in right hip: Secondary | ICD-10-CM

## 2021-10-14 NOTE — Telephone Encounter (Signed)
Spoke with son and he stated that patient teeth and jaws chatters for at least a couple of hours.  The house is about 78 degrees. Since it happens about a couple of hours in the morning a morning appointment was made.  Charlett Blake did not have any openings tomorrow and also other providers at office in am did not have any openings here at our office and is willing to see another provider at another office.  Appointment scheduled for tomorrow at [facility] location with Dr. Carlota Raspberry.

## 2021-10-15 ENCOUNTER — Ambulatory Visit (INDEPENDENT_AMBULATORY_CARE_PROVIDER_SITE_OTHER): Payer: Medicare HMO | Admitting: Family Medicine

## 2021-10-15 ENCOUNTER — Other Ambulatory Visit: Payer: Self-pay

## 2021-10-15 ENCOUNTER — Encounter: Payer: Self-pay | Admitting: Family Medicine

## 2021-10-15 VITALS — BP 124/78 | HR 82 | Temp 98.1°F | Resp 16 | Ht 63.0 in | Wt 188.4 lb

## 2021-10-15 DIAGNOSIS — H02519 Abnormal innervation syndrome unspecified eye, unspecified eyelid: Secondary | ICD-10-CM

## 2021-10-15 MED ORDER — TEMAZEPAM 7.5 MG PO CAPS: ORAL_CAPSULE | ORAL | 0 refills | Status: DC

## 2021-10-15 NOTE — Progress Notes (Signed)
Subjective:  Patient ID: Jackie Stevens, female    DOB: 1946/11/24  Age: 75 y.o. MRN: [id number]  CC:  Chief Complaint  Patient presents with   teeth chattering     Pt reports started about a month ago, notes inability to stop chattering at times, usually in the mornings,     HPI Jackie Stevens presents for   Teeth chattering: Phone note with PCP November 1st noted.  No apparent med cause.  Symptoms few hours in the morning.  House temp at 78 degrees.  History of mild neurocognitive disorder, referred back to neurology at her wellness visit October 4.  Here with son Jackie Stevens.  Per pt - notices teeth chattering all the time. Per son, notices more in the morning. Comes in waves.  Present past 6 weeks - becoming more frequent. On and off initially. Now daily, off and on yesterday 7am to noon. Present today.  No shivers, no fever. No chills. Not feeling cold at the time. No tremors or other body movements at the time. No pain/discomfort, just aggravating. No abnormal facial movements/twitching. No mouth pain, trouble chewing or swallowing.  Trying to keep mouth open to prevent teeth from chipping.  Last dental visit within a year.  No new HA, weakness.  Possibly started after new med in past 2 months that takes at night for depression. Possibly Restoril - not taking now? Off past few days. Sleeping ok off meds. Was taking daily.      History Patient Active Problem List   Diagnosis Date Noted   Right hip pain 09/16/2021   Primary osteoarthritis of left knee 01/19/2021   Osteoarthritis of left knee 11/21/2020   Mild neurocognitive disorder 02/05/2020   Hyperglycemia 01/17/2020   Vertigo 08/05/2019   Palpitations 05/28/2019   Other fatigue 02/28/2018   Shortness of breath on exertion 02/28/2018   Vitamin D deficiency 02/28/2018   Obesity 09/11/2017   Dermatitis 03/06/2017   Arthritis of left shoulder region 09/30/2016   Pain of joint of left ankle and foot 09/15/2016   Left  shoulder pain 09/14/2016   Preventative health care 02/26/2016   Radicular pain of sacrum 02/26/2016   Vitamin B12 deficiency 02/26/2016   Ventral hernia 12/02/2015   Pelvic mass in female 12/02/2015   Colitis 09/14/2015   Paresthesia 03/23/2015   Otitis, externa, infective 03/13/2015   Hyponatremia 03/13/2015   Cystitis 09/10/2014   Headache 08/21/2014   Low back pain 06/09/2014   Otitis media 01/07/2014   Anemia 10/06/2013   Medicare annual wellness visit, subsequent 10/06/2013   Asthma, mild intermittent 04/02/2013   Internal hemorrhoid, bleeding 01/23/2013   Diverticulitis of rectosigmoid 11/28/2012   Colonic diverticular abscess 11/21/2012   Abnormal cervical cytology 10/25/2012   Cancer ([place])    Insomnia    Hypothyroidism    Constipation    Cough 09/02/2012   Chest pain 08/05/2011   Hyperlipidemia, mixed 11/08/2010   Essential hypertension 11/08/2010   GERD (gastroesophageal reflux disease) 11/08/2010   Obstructive sleep apnea 11/04/2010   Allergic rhinitis 11/04/2010   Past Medical History:  Diagnosis Date   Abnormal cervical cytology 10/25/2012   Follows with Dr Leavy Cella of Gyn   Allergic rhinitis 11/04/2010   Qualifier: Diagnosis of  By: Annamaria Boots MD, Clinton D    Anemia 10/06/2013   Anginal pain    pt has history of CP states had cardiac workup with no specific issues identified    Arthritis    knees; right thumb; shoulders  Arthritis of left shoulder region 09/30/2016   X-ray of the left shoulder on 09/14/2016: Marked degenerative change with significant osteophytic spurring and subchondral sclerosis.  Loss of glenohumeral joint space.  Well-maintained subacromial space.  Injected 09/30/2016 [name] .   Asthma, mild intermittent 04/02/2013   Chicken pox as a child   Colonic diverticular abscess 11/21/2012   Constipation    Decreased hearing    Dermatitis 03/06/2017   Diverticulitis 10/25/2012   pt. reports that a drain was placed - 09/2012      Diverticulitis of rectosigmoid 11/28/2012   Dry mouth    Essential hypertension 11/08/2010   Qualifier: Diagnosis of  By: Annamaria Boots MD, Clinton D    Excessive thirst    External hemorrhoid, bleeding    "sometimes" (Jan 03, 2013)   Frequent urination    GERD (gastroesophageal reflux disease)    H/O hiatal hernia    Hand tingling    Heart murmur    History of kidney stones    Hyperglycemia 01/17/2020   Hyperlipidemia    Hypothyroidism    Incontinence    Insomnia    Kidney stones 1970's   "passed on their own" (2013/01/03)   Low back pain 06/09/2014   Measles as a child   Mild neurocognitive disorder 02/05/2020   Obesity 09/11/2017   Obstructive sleep apnea 11/04/2010   NPSG [facility] 07/15/10- AHI 13.5/ hr CPAP 10/APS     Otitis media 01/07/2014   Palpitations    Paresthesia 03/23/2015   Left face   PONV (postoperative nausea and vomiting)    Rheumatoid arteritis    Shortness of breath dyspnea    using stairs   Sinus pain    Swelling of both lower extremities    Thyroid cancer 1980's   Tinnitus    UTI (urinary tract infection) 04/02/2013   Vertigo 08/05/2019   Vitamin D deficiency 02/28/2018   Past Surgical History:  Procedure Laterality Date   CHOLECYSTECTOMY  1990   COLON SURGERY     COLOSTOMY REVISION  01/03/13   Procedure: COLON RESECTION SIGMOID;  Surgeon: Gwenyth Ober, MD;  Location: [facility];  Service: General;  Laterality: N/A;   CYSTOSCOPY WITH STENT PLACEMENT  2013-01-03   Procedure: CYSTOSCOPY WITH STENT PLACEMENT;  Surgeon: Hanley Ben, MD;  Location: [facility];  Service: Urology;  Laterality: N/A;   DILATION AND CURETTAGE OF UTERUS  1960's   "lots of them; had miscarriages" (2013/01/03)   LYSIS OF ADHESION N/A 12/02/2015   Procedure: LAPAROSCOPIC LYSIS OF ADHESION;  Surgeon: Johnathan Hausen, MD;  Location[facility];  Service: General;  Laterality: N/A;   ROBOTIC ASSISTED BILATERAL SALPINGO OOPHERECTOMY Bilateral 12/02/2015   Procedure: XI ROBOTIC ASSISTED BILATERAL SALPINGO  OOPHORECTOMY;  Surgeon: Everitt Amber, MD;  Location: [facility];  Service: Gynecology;  Laterality: Bilateral;   SIGMOID RESECTION / RECTOPEXY  03-Jan-2013   THYROIDECTOMY, PARTIAL  1988   "then did iodine to remove the rest" (03-Jan-2013)   TONSILLECTOMY  1951?   TOTAL KNEE ARTHROPLASTY Left 01/19/2021   Procedure: TOTAL KNEE ARTHROPLASTY;  Surgeon: Gaynelle Arabian, MD;  Location[facility];  Service: Orthopedics;  Laterality: Left;  39min   TRANSRECTAL DRAINAGE OF PELVIC ABSCESS  10/27/2012   VAGINAL HYSTERECTOMY  1970's   "still have my ovaries" (01/03/13)   Allergies  Allergen Reactions   Neomycin-Bacitracin Zn-Polymyx Rash    Polysporin- is tolerated    Niacin Other (See Comments) and Cough    "cough til I threw up" (January 03, 2013)   Ciprofloxacin Hives  Got cipro and flagyl at same time, localized hives to IV arm   Flagyl [Metronidazole] Hives    Got cipro and flagyl at same time, localized hives to IV arm   Prior to Admission medications   Medication Sig Start Date End Date Taking? Authorizing Provider  Azelastine HCl 137 MCG/SPRAY SOLN PLACE 2 SPRAYS INTO BOTH NOSTRILS 2 (TWO) TIMES DAILY. 08/11/21  Yes Mosie Lukes, MD  Black Pepper-Turmeric (TURMERIC CURCUMIN) 04-999 MG CAPS 1 tablet daily. 02/09/16  Yes [provider]  carboxymethylcellulose (REFRESH PLUS) 0.5 % SOLN Place 1 drop into both eyes 3 (three) times daily as needed (dry eyes).   Yes [provider]  Cholecalciferol-Vitamin C (VITAMIN D3-VITAMIN C) 1000-500 UNIT-MG CAPS daily. 04/08/10  Yes [provider]  docusate sodium (COLACE) 100 MG capsule Take 100 mg by mouth daily.   Yes [provider]  famotidine (PEPCID) 20 MG tablet TAKE 2 TABLETS BY MOUTH EVERY DAY 07/08/21  Yes Mosie Lukes, MD  fluticasone [facility]) 50 MCG/ACT nasal spray PRN (seasonal) 02/11/17  Yes [provider]  gabapentin (NEURONTIN) 100 MG capsule Take a 300 mg capsule three times a day for two weeks following  surgery.Then take a 300 mg capsule two times a day for two weeks. Then take a 300 mg capsule once a day for two weeks. Then discontinue. 01/20/21  Yes Fenton Foy D, PA-C  hydrochlorothiazide (HYDRODIURIL) 25 MG tablet TAKE 1 TABLET (25 MG TOTAL) BY MOUTH DAILY. 07/29/21  Yes Mosie Lukes, MD  levothyroxine (SYNTHROID, LEVOTHROID) 150 MCG tablet Take 150 mcg by mouth daily before breakfast.   Yes [provider]  losartan (COZAAR) 100 MG tablet TAKE 1 TABLET BY MOUTH EVERYDAY AT BEDTIME 06/26/21  Yes Mosie Lukes, MD  meclizine (ANTIVERT) 25 MG tablet take one a day 08/17/19  Yes [provider]  metoprolol tartrate (LOPRESSOR) 100 MG tablet TAKE 1&1/2 TABLETS BY MOUTH TWICE A DAY 09/21/21  Yes Mosie Lukes, MD  naproxen (NAPROSYN) 375 MG tablet TAKE 1 TABLET (375 MG TOTAL) BY MOUTH 2 (TWO) TIMES DAILY AS NEEDED FOR MODERATE PAIN OR HEADACHE. 07/20/21  Yes Mosie Lukes, MD  nitroGLYCERIN (NITROSTAT) 0.4 MG SL tablet PLACE 1 TABLET UNDER THE TONGUE EVERY 5 MINUTES AS NEEDED FOR CHEST PAIN 11/07/18  Yes Mosie Lukes, MD  omeprazole (PRILOSEC) 40 MG capsule TAKE 1 CAPSULE BY MOUTH TWICE A DAY 07/08/21  Yes Mosie Lukes, MD  oxyCODONE (OXY IR/ROXICODONE) 5 MG immediate release tablet Take 1-2 tablets (5-10 mg total) by mouth every 6 (six) hours as needed for severe pain. 01/20/21  Yes Fenton Foy D, PA-C  polyethylene glycol (MIRALAX / GLYCOLAX) 17 g packet Take 17 g by mouth daily.   Yes [provider]  Psyllium-Calcium (METAMUCIL PLUS CALCIUM) CAPS daily. 04/08/10  Yes [provider]  rivaroxaban (XARELTO) 10 MG TABS tablet Take 1 tablet (10 mg total) by mouth daily with breakfast. 01/20/21  Yes Fenton Foy D, PA-C  rosuvastatin (CRESTOR) 40 MG tablet TAKE 1 TABLET BY MOUTH EVERY DAY 06/02/21  Yes Mosie Lukes, MD  temazepam (RESTORIL) 15 MG capsule TAKE 1 CAPSULE BY MOUTH AT BEDTIME AS NEEDED FOR SLEEP. 09/07/21  Yes Baird Lyons D, MD  COVID-19  mRNA vaccine, Moderna, 100 MCG/0.5ML injection INJECT AS DIRECTED Patient not taking: No sig reported 10/20/20 10/20/21  Carlyle Basques, MD   Social History   Socioeconomic History   Marital status: Married    Spouse  name: [name]   Number of children: 2   Years of education: 12   Highest education level: High school graduate  Occupational History   Occupation: Retired  Tobacco Use   Smoking status: Never   Smokeless tobacco: Never  Vaping Use   Vaping Use: Never used  Substance and Sexual Activity   Alcohol use: No    Alcohol/week: 0.0 standard drinks   Drug use: No   Sexual activity: Not Currently  Other Topics Concern   Not on file  Social History Narrative   Married    Children   Right handed   Some college   Social Determinants of Health   Financial Resource Strain: Not on file  Food Insecurity: Not on file  Transportation Needs: Not on file  Physical Activity: Not on file  Stress: Not on file  Social Connections: Not on file  Intimate Partner Violence: Not on file    Review of Systems   Objective:   Vitals:   10/15/21 0829  BP: 124/78  Pulse: 82  Resp: 16  Temp: 98.1 F (36.7 C)  TempSrc: Temporal  SpO2: 97%  Weight: 188 lb 6.4 oz (85.5 kg)  Height: 5\' 3"  (1.6 m)     Physical Exam Constitutional:      General: She is not in acute distress.    Appearance: Normal appearance. She is well-developed.  HENT:     Head: Normocephalic and atraumatic.  Eyes:     General: Lids are normal.     Extraocular Movements: Extraocular movements intact.     Right eye: Normal extraocular motion and no nystagmus.     Left eye: Normal extraocular motion and no nystagmus.  Neck:     Comments: No stridor.  Cardiovascular:     Rate and Rhythm: Normal rate.  Pulmonary:     Effort: Pulmonary effort is normal.  Musculoskeletal:     Cervical back: Neck supple.  Neurological:     General: No focal deficit present.     Mental Status: She is alert.     Comments:  Very fine twitching noted bilaterally infraorbital mm bilaterally, fine movements of jaw with teeth chattering.  Equal active facial movements, no slurred speech.  No facial droop.  Equal sensation of face, no rash, negative Tinel's at facial nerve.   No apparent weakness of extremities, equal grip strength.  Psychiatric:        Mood and Affect: Mood normal.     Assessment & Plan:  Jackie Stevens is a 75 y.o. female . Paradoxical facial movements, unspecified laterality - Plan: temazepam (RESTORIL) 7.5 MG capsule  -Faint, small involuntary facial movements, blepharospasm bilateral as well as slight teeth chattering.  On further history may be related to start of temazepam.  No focal neurologic symptoms otherwise, no new headaches.  No other known new medications.  -She has stopped temazepam past few days, will restart at lower dose with taper given long-term use.  Expect symptoms to improve as she comes off temazepam.   - Previous neurology referral noted.  Recommend follow-up with neurology if persistent involuntary facial movements.  RTC precautions if acute worsening.  Understanding of plan expressed with patient and son present.  Meds ordered this encounter  Medications   temazepam (RESTORIL) 7.5 MG capsule    Sig: 1 po every other day for 1 week, then every third day for 1 week.    Dispense:  7 capsule    Refill:  0   Patient Instructions  Taper off temazepam - take 7.5mg  every other day for this week,, then every third day for a week then stop. That medication may be contributing to the teeth chattering and facial movements.   If not improving with coming off this medicine, I would recommend neurology evaluate these symptoms as well.   Return to the clinic or go to the nearest emergency room if any of your symptoms worsen or new symptoms occur.     Signed,   Merri Ray, MD [facility] 10/15/21 9:37 AM

## 2021-10-15 NOTE — Patient Instructions (Signed)
Taper off temazepam - take 7.5mg  every other day for this week,, then every third day for a week then stop. That medication may be contributing to the teeth chattering and facial movements.   If not improving with coming off this medicine, I would recommend neurology evaluate these symptoms as well.   Return to the clinic or go to the nearest emergency room if any of your symptoms worsen or new symptoms occur.

## 2021-10-28 ENCOUNTER — Other Ambulatory Visit: Payer: Self-pay

## 2021-10-28 ENCOUNTER — Ambulatory Visit (INDEPENDENT_AMBULATORY_CARE_PROVIDER_SITE_OTHER): Payer: Medicare HMO | Admitting: Orthopaedic Surgery

## 2021-10-28 ENCOUNTER — Ambulatory Visit (INDEPENDENT_AMBULATORY_CARE_PROVIDER_SITE_OTHER): Payer: Medicare HMO

## 2021-10-28 VITALS — Ht 63.0 in | Wt 188.4 lb

## 2021-10-28 DIAGNOSIS — G8929 Other chronic pain: Secondary | ICD-10-CM

## 2021-10-28 DIAGNOSIS — M5441 Lumbago with sciatica, right side: Secondary | ICD-10-CM

## 2021-10-28 MED ORDER — GABAPENTIN 300 MG PO CAPS: 300.0000 mg | ORAL_CAPSULE | Freq: Every day | ORAL | 1 refills | Status: DC

## 2021-10-28 MED ORDER — PREDNISONE 50 MG PO TABS: ORAL_TABLET | ORAL | 0 refills | Status: DC

## 2021-10-28 NOTE — Progress Notes (Signed)
Office Visit Note   Patient: Jackie Stevens           Date of Birth: August 15, 1946           MRN: [id number] Visit Date: 10/28/2021              Requested by: Mosie Lukes, MD [address] [place],  [place] [address] PCP: Mosie Lukes, MD   Assessment & Plan: Visit Diagnoses:  1. Chronic right-sided low back pain with right-sided sciatica     Plan: Her signs and symptoms seem to be more consistent with sciatica to the right side and I cannot rule out an insufficiency fracture either of the spine or right side of her pelvis.  I given reassurance that this is not a hip issue.  However she is having significant pain.  I MRI of the lumbar spine is warranted based on her clinical exam findings and x-ray findings of her spine.  I will put her on prednisone for 5 days since she is not a diabetic and increase her Neurontin to 300 mg at bedtime.  All questions and concerns were answered addressed.  They agree with this treatment plan.  We will see her back in follow-up after the MRI is obtained.  Follow-Up Instructions: No follow-ups on file.   Orders:  Orders Placed This Encounter  Procedures   XR Lumbar Spine 2-3 Views   Meds ordered this encounter  Medications   predniSONE (DELTASONE) 50 MG tablet    Sig: Take once daily for 5 days.    Dispense:  5 tablet    Refill:  0   gabapentin (NEURONTIN) 300 MG capsule    Sig: Take 1 capsule (300 mg total) by mouth at bedtime.    Dispense:  30 capsule    Refill:  1      Procedures: No procedures performed   Clinical Data: No additional findings.   Subjective: Chief Complaint  Patient presents with   Right Hip - Pain  The patient is a very pleasant 75 year old female who is accompanied by her daughter today for evaluation treatment of right hip pain.  She is sent by Dr. Charlett Blake to assess her hip.  She injured this about 6 weeks ago and mechanical fall landing hard out of bed.  Her husband had recently passed away and  she felt like someone may have been in the room and so she fell getting out of bed.  She landed somewhat on her right hip but it seems like she landed more on her pelvis and her back.  X-rays in October obtained of her pelvis and her right hip and were negative for any type of fracture.  She is ambulating with a rolling walker and says the pain is actually getting worse.  She points to the sciatic region on the right side a source of her pain.  She denies any groin pain.  She is on Xarelto so she cannot take anti-inflammatories.  She has been on 100 mg of Neurontin just once a day.  She is not a diabetic.  She denies any change in bowel bladder function.  She denies any radicular symptoms going down either leg.  Her daughter states that she was definitely more mobile prior to this recent fall.  HPI  Review of Systems There is currently listed no headache, chest pain, shortness of breath, fever, chills, nausea, vomiting  Objective: Vital Signs: Ht 5\' 3"  (1.6 m)   Wt  188 lb 6.4 oz (85.5 kg)   BMI 33.37 kg/m   Physical Exam She is alert and orient x3 and in no acute distress Ortho Exam Examination of her right hip is entirely normal.  Her right hip as fluid and full range of motion with no pain in the groin at all.  There is no pain to palpation of the trochanteric area and no pain in the hip with compression of the hip joint.  She does have significant pain in the lower aspect of her lumbar spine to the right side in the lower pelvis to the right side.  This is very uncomfortable to her and she does have a positive straight leg raise on the right side.  Her left exam is entirely normal in terms of lower extremity and low back to the left side.  She has good strength in her bilateral lower extremities and no sensory deficits. Specialty Comments:  No specialty comments available.  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  There is also significant arthritic changes at several levels and degenerative disc disease.    PMFS History: Patient Active Problem List   Diagnosis Date Noted   Right hip pain 09/16/2021   Primary osteoarthritis of left knee 01/19/2021   Osteoarthritis of left knee 11/21/2020   Mild neurocognitive disorder 02/05/2020   Hyperglycemia 01/17/2020   Vertigo 08/05/2019   Palpitations 05/28/2019   Other fatigue 02/28/2018   Shortness of breath on exertion 02/28/2018   Vitamin D deficiency 02/28/2018   Obesity 09/11/2017   Dermatitis 03/06/2017   Arthritis of left shoulder region 09/30/2016   Pain of joint of left ankle and foot 09/15/2016   Left shoulder pain 09/14/2016   Preventative health care 02/26/2016   Radicular pain of sacrum 02/26/2016   Vitamin B12 deficiency 02/26/2016   Ventral hernia 12/02/2015   Pelvic mass in female 12/02/2015   Colitis 09/14/2015   Paresthesia 03/23/2015   Otitis, externa, infective 03/13/2015   Hyponatremia 03/13/2015   Cystitis 09/10/2014   Headache 08/21/2014   Low back pain 06/09/2014   Otitis media 01/07/2014   Anemia 10/06/2013   Medicare annual wellness visit, subsequent 10/06/2013   Asthma, mild intermittent 04/02/2013   Internal hemorrhoid, bleeding 01/23/2013   Diverticulitis of rectosigmoid 11/28/2012   Colonic diverticular abscess 11/21/2012   Abnormal cervical cytology 10/25/2012   Cancer ([place])    Insomnia    Hypothyroidism    Constipation    Cough 09/02/2012   Chest pain 08/05/2011   Hyperlipidemia, mixed 11/08/2010   Essential hypertension 11/08/2010   GERD (gastroesophageal reflux disease) 11/08/2010   Obstructive sleep apnea 11/04/2010   Allergic rhinitis 11/04/2010   Past Medical History:  Diagnosis Date   Abnormal cervical cytology 10/25/2012   Follows with Dr Leavy Cella of Gyn   Allergic rhinitis 11/04/2010   Qualifier: Diagnosis of  By: Annamaria Boots MD,  Clinton D    Anemia 10/06/2013   Anginal pain    pt has history of CP states had cardiac workup with no specific issues identified    Arthritis    knees; right thumb; shoulders   Arthritis of left shoulder region 09/30/2016   X-ray of the left shoulder on 09/14/2016: Marked degenerative change with significant osteophytic spurring and subchondral sclerosis.  Loss of glenohumeral joint space.  Well-maintained subacromial space.  Injected 09/30/2016 [name] .   Asthma, mild intermittent  04/02/2013   Chicken pox as a child   Colonic diverticular abscess 11/21/2012   Constipation    Decreased hearing    Dermatitis 03/06/2017   Diverticulitis 10/25/2012   pt. reports that a drain was placed - 09/2012     Diverticulitis of rectosigmoid 11/28/2012   Dry mouth    Essential hypertension 11/08/2010   Qualifier: Diagnosis of  By: Annamaria Boots MD, Clinton D    Excessive thirst    External hemorrhoid, bleeding    "sometimes" (January 06, 2013)   Frequent urination    GERD (gastroesophageal reflux disease)    H/O hiatal hernia    Hand tingling    Heart murmur    History of kidney stones    Hyperglycemia 01/17/2020   Hyperlipidemia    Hypothyroidism    Incontinence    Insomnia    Kidney stones 1970's   "passed on their own" (January 06, 2013)   Low back pain 06/09/2014   Measles as a child   Mild neurocognitive disorder 02/05/2020   Obesity 09/11/2017   Obstructive sleep apnea 11/04/2010   NPSG [facility] 07/15/10- AHI 13.5/ hr CPAP 10/APS     Otitis media 01/07/2014   Palpitations    Paresthesia 03/23/2015   Left face   PONV (postoperative nausea and vomiting)    Rheumatoid arteritis    Shortness of breath dyspnea    using stairs   Sinus pain    Swelling of both lower extremities    Thyroid cancer 1980's   Tinnitus    UTI (urinary tract infection) 04/02/2013   Vertigo 08/05/2019   Vitamin D deficiency 02/28/2018    Family History  Problem Relation Age of Onset   Heart disease Father    Pneumonia Father     Hypertension Father    Hyperlipidemia Father    Cancer Father        skin   Stroke Father    Alzheimer's disease Mother    Heart disease Mother    Depression Mother    Emphysema Brother        marijuana and cigarettes   Alcohol abuse Brother    Hearing loss Brother    Diabetes Maternal Grandmother    Alzheimer's disease Paternal Grandmother    Cancer Paternal Grandmother        lung?- smoker   Hyperlipidemia Paternal Grandmother    Heart attack Paternal Grandfather    Alcohol abuse Paternal Grandfather    Neurofibromatosis Son        schwanomatosis   Neurofibromatosis Son        swanomatosis   Cancer Paternal Aunt     Past Surgical History:  Procedure Laterality Date   CHOLECYSTECTOMY  1990   COLON SURGERY     COLOSTOMY REVISION  2013-01-06   Procedure: COLON RESECTION SIGMOID;  Surgeon: Gwenyth Ober, MD;  Location: [facility];  Service: General;  Laterality: N/A;   CYSTOSCOPY WITH STENT PLACEMENT  01/06/13   Procedure: CYSTOSCOPY WITH STENT PLACEMENT;  Surgeon: Hanley Ben, MD;  Location: [facility];  Service: Urology;  Laterality: N/A;   DILATION AND CURETTAGE OF UTERUS  1960's   "lots of them; had miscarriages" (2013/01/06)   LYSIS OF ADHESION N/A 12/02/2015   Procedure: LAPAROSCOPIC LYSIS OF ADHESION;  Surgeon: Johnathan Hausen, MD;  [facility];  Service: General;  Laterality: N/A;   ROBOTIC ASSISTED BILATERAL SALPINGO OOPHERECTOMY Bilateral 12/02/2015   Procedure: XI ROBOTIC ASSISTED BILATERAL SALPINGO OOPHORECTOMY;  Surgeon: Everitt Amber, MD;  Location[facility];  Service: Gynecology;  Laterality: Bilateral;   SIGMOID RESECTION / RECTOPEXY  12/19/2012   THYROIDECTOMY, PARTIAL  1988   "then did iodine to remove the rest" (12/19/2012)   TONSILLECTOMY  1951?   TOTAL KNEE ARTHROPLASTY Left 01/19/2021   Procedure: TOTAL KNEE ARTHROPLASTY;  Surgeon: Gaynelle Arabian, MD;  [facility];  Service: Orthopedics;  Laterality: Left;  58min   TRANSRECTAL DRAINAGE OF PELVIC ABSCESS   10/27/2012   VAGINAL HYSTERECTOMY  1970's   "still have my ovaries" (12/19/2012)   Social History   Occupational History   Occupation: Retired  Tobacco Use   Smoking status: Never   Smokeless tobacco: Never  Vaping Use   Vaping Use: Never used  Substance and Sexual Activity   Alcohol use: No    Alcohol/week: 0.0 standard drinks   Drug use: No   Sexual activity: Not Currently

## 2021-10-29 ENCOUNTER — Other Ambulatory Visit: Payer: Self-pay

## 2021-10-29 ENCOUNTER — Other Ambulatory Visit: Payer: Self-pay | Admitting: Orthopaedic Surgery

## 2021-10-29 DIAGNOSIS — M5441 Lumbago with sciatica, right side: Secondary | ICD-10-CM

## 2021-11-02 ENCOUNTER — Other Ambulatory Visit: Payer: Self-pay | Admitting: Neurology

## 2021-11-02 ENCOUNTER — Other Ambulatory Visit: Payer: Self-pay | Admitting: Family Medicine

## 2021-11-02 DIAGNOSIS — R059 Cough, unspecified: Secondary | ICD-10-CM

## 2021-11-02 DIAGNOSIS — K219 Gastro-esophageal reflux disease without esophagitis: Secondary | ICD-10-CM

## 2021-11-02 DIAGNOSIS — J302 Other seasonal allergic rhinitis: Secondary | ICD-10-CM

## 2021-11-28 ENCOUNTER — Inpatient Hospital Stay: Admission: RE | Admit: 2021-11-28 | Payer: Medicare HMO | Source: Ambulatory Visit

## 2021-12-02 ENCOUNTER — Other Ambulatory Visit: Payer: Self-pay

## 2021-12-02 ENCOUNTER — Ambulatory Visit
Admission: RE | Admit: 2021-12-02 | Discharge: 2021-12-02 | Disposition: A | Discharge status: Home / Self Care | Payer: Medicare HMO | Source: Ambulatory Visit | Attending: Orthopaedic Surgery | Admitting: Orthopaedic Surgery

## 2021-12-02 DIAGNOSIS — G8929 Other chronic pain: Secondary | ICD-10-CM

## 2021-12-02 DIAGNOSIS — M5416 Radiculopathy, lumbar region: Secondary | ICD-10-CM | POA: Diagnosis not present

## 2021-12-02 DIAGNOSIS — M48061 Spinal stenosis, lumbar region without neurogenic claudication: Secondary | ICD-10-CM | POA: Diagnosis not present

## 2021-12-02 DIAGNOSIS — M4327 Fusion of spine, lumbosacral region: Secondary | ICD-10-CM | POA: Diagnosis not present

## 2021-12-09 ENCOUNTER — Other Ambulatory Visit: Payer: Self-pay

## 2021-12-09 ENCOUNTER — Encounter: Payer: Self-pay | Admitting: Orthopaedic Surgery

## 2021-12-09 ENCOUNTER — Ambulatory Visit (INDEPENDENT_AMBULATORY_CARE_PROVIDER_SITE_OTHER): Payer: Medicare HMO | Admitting: Orthopaedic Surgery

## 2021-12-09 DIAGNOSIS — G8929 Other chronic pain: Secondary | ICD-10-CM

## 2021-12-09 DIAGNOSIS — M48061 Spinal stenosis, lumbar region without neurogenic claudication: Secondary | ICD-10-CM

## 2021-12-09 DIAGNOSIS — M5441 Lumbago with sciatica, right side: Secondary | ICD-10-CM

## 2021-12-09 NOTE — Progress Notes (Signed)
The patient is a 75 year old female who comes in to go over MRI of her lumbar spine.  She does ambulate with a rolling walker.  Her pain really flared up over a month ago after falling out of bed.  She not had any significant issues before then.  We did temporize her pain with some Neurontin and a steroid taper.  She was still having significant pain so a MRI was warranted of the lumbar spine to rule out any type of acute injury or fracture.  The MRI of the lumbar spine shows severe advanced degenerative disease at most levels.  There is actually a fusion between L5 and S1 that is an ankylosis and not surgery related.  She is never had spine surgery.  She has severe facet disease and advanced foraminal stenosis throughout the lumbar spine.  From my standpoint, I would like to send her to Dr. Ernestina Patches as a consultation because I am not sure where best to recommend an injection for her right side radicular pain and sciatica.  Her hip exam is entirely normal and her right hip x-rays are normal as well.  Her family is with her and she and her family agree for this consultation.

## 2021-12-23 ENCOUNTER — Encounter: Payer: Self-pay | Admitting: Physical Medicine and Rehabilitation

## 2021-12-23 ENCOUNTER — Ambulatory Visit: Payer: Medicare HMO | Admitting: Physical Medicine and Rehabilitation

## 2021-12-23 ENCOUNTER — Other Ambulatory Visit: Payer: Self-pay

## 2021-12-23 VITALS — BP 184/78 | HR 60

## 2021-12-23 DIAGNOSIS — M47816 Spondylosis without myelopathy or radiculopathy, lumbar region: Secondary | ICD-10-CM | POA: Diagnosis not present

## 2021-12-23 DIAGNOSIS — M5416 Radiculopathy, lumbar region: Secondary | ICD-10-CM | POA: Diagnosis not present

## 2021-12-23 DIAGNOSIS — M48062 Spinal stenosis, lumbar region with neurogenic claudication: Secondary | ICD-10-CM | POA: Diagnosis not present

## 2021-12-23 DIAGNOSIS — G3184 Mild cognitive impairment, so stated: Secondary | ICD-10-CM | POA: Diagnosis not present

## 2021-12-23 DIAGNOSIS — M4726 Other spondylosis with radiculopathy, lumbar region: Secondary | ICD-10-CM

## 2021-12-23 DIAGNOSIS — R269 Unspecified abnormalities of gait and mobility: Secondary | ICD-10-CM | POA: Diagnosis not present

## 2021-12-23 NOTE — Progress Notes (Signed)
Jackie Stevens - 76 y.o. female MRN [id number]  Date of birth: March 22, 1946  Office Visit Note: Visit Date: 12/23/2021 PCP: Mosie Lukes, MD Referred by: Mosie Lukes, MD  Subjective: No chief complaint on file.  HPI: Jackie Stevens is a 76 y.o. female who comes in today for evaluation of chronic, worsening and severe right sided lower back pain radiating to buttock region. Patients daughter in law accompanying her during our visit today. Patient is somewhat of poor historian, daughter states she is currently being evaluated by neurologist for cognitive decline. Patient lives at home with her son whom helps with her care. Patients pain has been ongoing intermittently for several years, however her pain became worse several months ago after falling out of bed. Daughter in law reports history of frequent falls. Patient describes pain as a sore sensation, currently rates as 7 out of 10. Patient reports some relief of pain with rest and use of medications. Patient currently taking Gabapentin and Tylenol. Daughter in law reports patient did attend chiropractic treatments several years ago for chronic back issues that did seem to help alleviate pain. 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 Daughter in law reports patient complains of pain daily and is having difficulty performing self care tasks due to severe pain. Patient currently using rolling walker to assist with ambulation and prevent falls. Patient denies focal weakness, numbness and tingling. Patient denies recent trauma.    56 % Oswestry Disability Index Score: 20 to 30 (60%) severe disability: Pain remains the main problem in this group but activities of daily living are affected. These patients require a detailed investigation.  Review of Systems  Musculoskeletal:  Positive for back pain.  Neurological:  Negative for tingling, sensory change,  focal weakness and weakness.  All other systems reviewed and are negative. Otherwise per HPI.  Assessment & Plan: Visit Diagnoses:    ICD-10-CM   1. Lumbar radiculopathy  M54.16 Ambulatory referral to Physical Medicine Rehab    2. Other spondylosis with radiculopathy, lumbar region  M47.26     3. Spinal stenosis of lumbar region with neurogenic claudication  M48.062     4. Facet hypertrophy of lumbar region  M47.816     5. Gait disturbance  R26.9     6. Mild neurocognitive disorder  G31.84        Plan: Findings:  Chronic, worsening and severe right sided lower back pain radiating to buttock. Patient continues to have excruciating and debilitating pain despite good conservative therapies such as formal chiropractic therapy, rest and use of medications. Patient's clinical presentation and exam are consistent with L3 nerve pattern. Patient does have severe spinal canal stenosis at L2-L3 and L3-L4 per her recent lumbar MRI. We believe the next step is to perform a diagnostic and hopefully therapeutic right L3 transforaminal epidural steroid injection under fluoroscopic guidance. Patient encouraged to remain active and to continue using rolling walker to assist with ambulation and prevent falls. No red flag symptoms noted upon exam today.    Meds & Orders: No orders of the defined types were placed in this encounter.   Orders Placed This Encounter  Procedures   Ambulatory referral to Physical Medicine Rehab    Follow-up: Return for Right L3 transforaminal epidural steroid injection.   Procedures: No procedures performed      Clinical History: No specialty comments available.   She reports that she has never  smoked. She has never used smokeless tobacco.  Recent Labs    03/03/21 1354  HGBA1C 5.5    Objective:  VS:  HT:     WT:    BMI:      BP: (!) 184/78   HR:60bpm   TEMP: ( )   RESP:  Physical Exam Vitals and nursing note reviewed.  HENT:     Head: Normocephalic and  atraumatic.     Right Ear: External ear normal.     Left Ear: External ear normal.     Nose: Nose normal.     Mouth/Throat:     Mouth: Mucous membranes are moist.  Eyes:     Extraocular Movements: Extraocular movements intact.  Cardiovascular:     Rate and Rhythm: Normal rate.     Pulses: Normal pulses.  Pulmonary:     Effort: Pulmonary effort is normal.  Abdominal:     General: Abdomen is flat. There is no distension.  Musculoskeletal:        General: Tenderness present.     Cervical back: Normal range of motion.     Comments: Pt rises from seated position to standing without difficulty. Good lumbar range of motion. Strong distal strength without clonus, no pain upon palpation of greater trochanters. Sensation intact bilaterally. Dysesthesias noted to right L3 dermatome. Ambulates with rolling walker, gait slow and unsteady.   Skin:    General: Skin is warm and dry.     Capillary Refill: Capillary refill takes less than 2 seconds.  Neurological:     Mental Status: She is alert.     Gait: Gait abnormal.  Psychiatric:        Mood and Affect: Mood normal.    Ortho Exam  Imaging: No results found.  Past Medical/Family/Surgical/Social History: Medications & Allergies reviewed per EMR, new medications updated. Patient Active Problem List   Diagnosis Date Noted   Right hip pain 09/16/2021   Primary osteoarthritis of left knee 01/19/2021   Osteoarthritis of left knee 11/21/2020   Mild neurocognitive disorder 02/05/2020   Hyperglycemia 01/17/2020   Vertigo 08/05/2019   Palpitations 05/28/2019   Other fatigue 02/28/2018   Shortness of breath on exertion 02/28/2018   Vitamin D deficiency 02/28/2018   Obesity 09/11/2017   Dermatitis 03/06/2017   Arthritis of left shoulder region 09/30/2016   Pain of joint of left ankle and foot 09/15/2016   Left shoulder pain 09/14/2016   Preventative health care 02/26/2016   Radicular pain of sacrum 02/26/2016   Vitamin B12 deficiency  02/26/2016   Ventral hernia 12/02/2015   Pelvic mass in female 12/02/2015   Colitis 09/14/2015   Paresthesia 03/23/2015   Otitis, externa, infective 03/13/2015   Hyponatremia 03/13/2015   Cystitis 09/10/2014   Headache 08/21/2014   Low back pain 06/09/2014   Otitis media 01/07/2014   Anemia 10/06/2013   Medicare annual wellness visit, subsequent 10/06/2013   Asthma, mild intermittent 04/02/2013   Internal hemorrhoid, bleeding 01/23/2013   Diverticulitis of rectosigmoid 11/28/2012   Colonic diverticular abscess 11/21/2012   Abnormal cervical cytology 10/25/2012   Cancer ([place])    Insomnia    Hypothyroidism    Constipation    Cough 09/02/2012   Chest pain 08/05/2011   Hyperlipidemia, mixed 11/08/2010   Essential hypertension 11/08/2010   GERD (gastroesophageal reflux disease) 11/08/2010   Obstructive sleep apnea 11/04/2010   Allergic rhinitis 11/04/2010   Past Medical History:  Diagnosis Date   Abnormal cervical cytology 10/25/2012   Follows with Dr  Leavy Cella of Gyn   Allergic rhinitis 11/04/2010   Qualifier: Diagnosis of  By: Annamaria Boots MD, Clinton D    Anemia 10/06/2013   Anginal pain    pt has history of CP states had cardiac workup with no specific issues identified    Arthritis    knees; right thumb; shoulders   Arthritis of left shoulder region 09/30/2016   X-ray of the left shoulder on 09/14/2016: Marked degenerative change with significant osteophytic spurring and subchondral sclerosis.  Loss of glenohumeral joint space.  Well-maintained subacromial space.  Injected 09/30/2016 [name] .   Asthma, mild intermittent 04/02/2013   Chicken pox as a child   Colonic diverticular abscess 11/21/2012   Constipation    Decreased hearing    Dermatitis 03/06/2017   Diverticulitis 10/25/2012   pt. reports that a drain was placed - 09/2012     Diverticulitis of rectosigmoid 11/28/2012   Dry mouth    Essential hypertension 11/08/2010   Qualifier: Diagnosis of  By: Annamaria Boots MD,  Clinton D    Excessive thirst    External hemorrhoid, bleeding    "sometimes" (12/25/12)   Frequent urination    GERD (gastroesophageal reflux disease)    H/O hiatal hernia    Hand tingling    Heart murmur    History of kidney stones    Hyperglycemia 01/17/2020   Hyperlipidemia    Hypothyroidism    Incontinence    Insomnia    Kidney stones 1970's   "passed on their own" (12-25-12)   Low back pain 06/09/2014   Measles as a child   Mild neurocognitive disorder 02/05/2020   Obesity 09/11/2017   Obstructive sleep apnea 11/04/2010   NPSG [facility] 07/15/10- AHI 13.5/ hr CPAP 10/APS     Otitis media 01/07/2014   Palpitations    Paresthesia 03/23/2015   Left face   PONV (postoperative nausea and vomiting)    Rheumatoid arteritis    Shortness of breath dyspnea    using stairs   Sinus pain    Swelling of both lower extremities    Thyroid cancer 1980's   Tinnitus    UTI (urinary tract infection) 04/02/2013   Vertigo 08/05/2019   Vitamin D deficiency 02/28/2018   Family History  Problem Relation Age of Onset   Heart disease Father    Pneumonia Father    Hypertension Father    Hyperlipidemia Father    Cancer Father        skin   Stroke Father    Alzheimer's disease Mother    Heart disease Mother    Depression Mother    Emphysema Brother        marijuana and cigarettes   Alcohol abuse Brother    Hearing loss Brother    Diabetes Maternal Grandmother    Alzheimer's disease Paternal Grandmother    Cancer Paternal Grandmother        lung?- smoker   Hyperlipidemia Paternal Grandmother    Heart attack Paternal Grandfather    Alcohol abuse Paternal Grandfather    Neurofibromatosis Son        schwanomatosis   Neurofibromatosis Son        swanomatosis   Cancer Paternal Aunt    Past Surgical History:  Procedure Laterality Date   CHOLECYSTECTOMY  1990   COLON SURGERY     COLOSTOMY REVISION  12-25-12   Procedure: COLON RESECTION SIGMOID;  Surgeon: Gwenyth Ober, MD;  Location: [facility];   Service: General;  Laterality: N/A;   CYSTOSCOPY  WITH STENT PLACEMENT  12/19/2012   Procedure: CYSTOSCOPY WITH STENT PLACEMENT;  Surgeon: Hanley Ben, MD;  Location: [facility];  Service: Urology;  Laterality: N/A;   DILATION AND CURETTAGE OF UTERUS  1960's   "lots of them; had miscarriages" (12/19/2012)   LYSIS OF ADHESION N/A 12/02/2015   Procedure: LAPAROSCOPIC LYSIS OF ADHESION;  Surgeon: Johnathan Hausen, MD;  Location: [facility];  Service: General;  Laterality: N/A;   ROBOTIC ASSISTED BILATERAL SALPINGO OOPHERECTOMY Bilateral 12/02/2015   Procedure: XI ROBOTIC ASSISTED BILATERAL SALPINGO OOPHORECTOMY;  Surgeon: Everitt Amber, MD;  Location: [facility];  Service: Gynecology;  Laterality: Bilateral;   SIGMOID RESECTION / RECTOPEXY  12/19/2012   THYROIDECTOMY, PARTIAL  1988   "then did iodine to remove the rest" (12/19/2012)   TONSILLECTOMY  1951?   TOTAL KNEE ARTHROPLASTY Left 01/19/2021   Procedure: TOTAL KNEE ARTHROPLASTY;  Surgeon: Gaynelle Arabian, MD;  Location: [facility];  Service: Orthopedics;  Laterality: Left;  62min   TRANSRECTAL DRAINAGE OF PELVIC ABSCESS  10/27/2012   VAGINAL HYSTERECTOMY  1970's   "still have my ovaries" (12/19/2012)   Social History   Occupational History   Occupation: Retired  Tobacco Use   Smoking status: Never   Smokeless tobacco: Never  Vaping Use   Vaping Use: Never used  Substance and Sexual Activity   Alcohol use: No    Alcohol/week: 0.0 standard drinks   Drug use: No   Sexual activity: Not Currently

## 2021-12-23 NOTE — Progress Notes (Signed)
Pt state lower back pain that ravels to her buttocks and right hip. Pt state she unsure but standing may be the issue. Pt state she takes pain meds to help ease her pain.  Numeric Pain Rating Scale and Functional Assessment Average Pain 7 Pain Right Now 2 My pain is intermittent, dull, and aching Pain is worse with: standing and some activites Pain improves with: heat/ice and medication   In the last MONTH (on 0-10 scale) has pain interfered with the following?  1. General activity like being  able to carry out your everyday physical activities such as walking, climbing stairs, carrying groceries, or moving a chair?  Rating(7)  2. Relation with others like being able to carry out your usual social activities and roles such as  activities at home, at work and in your community. Rating(8)  3. Enjoyment of life such that you have  been bothered by emotional problems such as feeling anxious, depressed or irritable?  Rating(9)

## 2022-01-07 ENCOUNTER — Ambulatory Visit: Payer: Self-pay

## 2022-01-07 ENCOUNTER — Ambulatory Visit (INDEPENDENT_AMBULATORY_CARE_PROVIDER_SITE_OTHER): Payer: Medicare HMO | Admitting: Physical Medicine and Rehabilitation

## 2022-01-07 ENCOUNTER — Other Ambulatory Visit: Payer: Self-pay

## 2022-01-07 ENCOUNTER — Encounter: Payer: Self-pay | Admitting: Physical Medicine and Rehabilitation

## 2022-01-07 VITALS — BP 202/82 | HR 62

## 2022-01-07 DIAGNOSIS — M5416 Radiculopathy, lumbar region: Secondary | ICD-10-CM | POA: Diagnosis not present

## 2022-01-07 MED ORDER — METHYLPREDNISOLONE ACETATE 80 MG/ML IJ SUSP
80.0000 mg | Freq: Once | INTRAMUSCULAR | Status: AC
  Administered 2022-01-07: 80 mg

## 2022-01-07 NOTE — Patient Instructions (Signed)

## 2022-01-07 NOTE — Progress Notes (Signed)
Pt state lower back pain that travels to her buttocks and right hip. Pt state she unsure but standing may be the issue. Pt state she takes pain meds to help ease her pain.  Numeric Pain Rating Scale and Functional Assessment Average Pain 5   In the last MONTH (on 0-10 scale) has pain interfered with the following?  1. General activity like being  able to carry out your everyday physical activities such as walking, climbing stairs, carrying groceries, or moving a chair?  Rating(7)   +Driver, +BT, -Dye Allergies.

## 2022-01-07 NOTE — Progress Notes (Signed)
Jackie Stevens - 76 y.o. female MRN [id number]  Date of birth: April 11, 1946  Office Visit Note: Visit Date: 01/07/2022 PCP: Mosie Lukes, MD Referred by: Mosie Lukes, MD  Subjective: Chief Complaint  Patient presents with   Lower Back - Pain   Right Hip - Pain   HPI:  Jackie Stevens is a 76 y.o. female who comes in today at the request of Barnet Pall, FNP for planned Right L3-4 Lumbar Transforaminal epidural steroid injection with fluoroscopic guidance.  The patient has failed conservative care including home exercise, medications, time and activity modification.  This injection will be diagnostic and hopefully therapeutic.  Please see requesting physician notes for further details and justification. MRI reviewed with images and spine model.  MRI reviewed in the note below.   ROS Otherwise per HPI.  Assessment & Plan: Visit Diagnoses:    ICD-10-CM   1. Lumbar radiculopathy  M54.16 XR C-ARM NO REPORT    Epidural Steroid injection    methylPREDNISolone acetate (DEPO-MEDROL) injection 80 mg      Plan: No additional findings.   Meds & Orders:  Meds ordered this encounter  Medications   methylPREDNISolone acetate (DEPO-MEDROL) injection 80 mg    Orders Placed This Encounter  Procedures   XR C-ARM NO REPORT   Epidural Steroid injection    Follow-up: Return in about 2 weeks (around 01/21/2022).   Procedures: No procedures performed  Lumbosacral Transforaminal Epidural Steroid Injection - Sub-Pedicular Approach with Fluoroscopic Guidance  Patient: Jackie Stevens      Date of Birth: Apr 14, 1946 MRN: [id number] PCP: Mosie Lukes, MD      Visit Date: 01/07/2022   Universal Protocol:    Date/Time: 01/07/2022  Consent Given By: the patient  Position: PRONE  Additional Comments: Vital signs were monitored before and after the procedure. Patient was prepped and draped in the usual sterile fashion. The correct patient, procedure, and site was  verified.   Injection Procedure Details:   Procedure diagnoses: Lumbar radiculopathy [M54.16]    Meds Administered:  Meds ordered this encounter  Medications   methylPREDNISolone acetate (DEPO-MEDROL) injection 80 mg    Laterality: Right  Location/Site: L3  Needle:5.0 in., 22 ga.  Short bevel or Quincke spinal needle  Needle Placement: Transforaminal  Findings:    -Comments: Excellent flow of contrast along the nerve, nerve root and into the epidural space.  Procedure Details: After squaring off the end-plates to get a true AP view, the C-arm was positioned so that an oblique view of the foramen as noted above was visualized. The target area is just inferior to the "nose of the scotty dog" or sub pedicular. The soft tissues overlying this structure were infiltrated with 2-3 ml. of 1% Lidocaine without Epinephrine.  The spinal needle was inserted toward the target using a "trajectory" view along the fluoroscope beam.  Under AP and lateral visualization, the needle was advanced so it did not puncture dura and was located close the 6 O'Clock position of the pedical in AP tracterory. Biplanar projections were used to confirm position. Aspiration was confirmed to be negative for CSF and/or blood. A 1-2 ml. volume of Isovue-250 was injected and flow of contrast was noted at each level. Radiographs were obtained for documentation purposes.   After attaining the desired flow of contrast documented above, a 0.5 to 1.0 ml test dose of 0.25% Marcaine was injected into each respective transforaminal space.  The patient was observed for 90 seconds post injection.  After no sensory deficits were reported, and normal lower extremity motor function was noted,   the above injectate was administered so that equal amounts of the injectate were placed at each foramen (level) into the transforaminal epidural space.   Additional Comments:  The patient tolerated the procedure well Dressing: 2 x 2 sterile  gauze and Band-Aid    Post-procedure details: Patient was observed during the procedure. Post-procedure instructions were reviewed.  Patient left the clinic in stable condition.     Clinical History: No specialty comments available.     Objective:  VS:  HT:     WT:    BMI:      BP:(!) 202/82   HR:62bpm   TEMP: ( )   RESP:  Physical Exam Vitals and nursing note reviewed.  Constitutional:      General: She is not in acute distress.    Appearance: Normal appearance. She is not ill-appearing.  HENT:     Head: Normocephalic and atraumatic.     Right Ear: External ear normal.     Left Ear: External ear normal.  Eyes:     Extraocular Movements: Extraocular movements intact.  Cardiovascular:     Rate and Rhythm: Normal rate.     Pulses: Normal pulses.  Pulmonary:     Effort: Pulmonary effort is normal. No respiratory distress.  Abdominal:     General: There is no distension.     Palpations: Abdomen is soft.  Musculoskeletal:        General: Tenderness present.     Cervical back: Neck supple.     Right lower leg: No edema.     Left lower leg: No edema.     Comments: Patient has good distal strength with no pain over the greater trochanters.  No clonus or focal weakness.  Skin:    Findings: No erythema, lesion or rash.  Neurological:     General: No focal deficit present.     Mental Status: She is alert and oriented to person, place, and time.     Sensory: No sensory deficit.     Motor: No weakness or abnormal muscle tone.     Coordination: Coordination normal.  Psychiatric:        Mood and Affect: Mood normal.        Behavior: Behavior normal.     Imaging: XR C-ARM NO REPORT  Result Date: 01/07/2022 Please see Notes tab for imaging impression.

## 2022-01-08 NOTE — Procedures (Signed)
Lumbosacral Transforaminal Epidural Steroid Injection - Sub-Pedicular Approach with Fluoroscopic Guidance  Patient: Jackie Stevens      Date of Birth: 1946-08-03 MRN: [id number] PCP: Mosie Lukes, MD      Visit Date: 01/07/2022   Universal Protocol:    Date/Time: 01/07/2022  Consent Given By: the patient  Position: PRONE  Additional Comments: Vital signs were monitored before and after the procedure. Patient was prepped and draped in the usual sterile fashion. The correct patient, procedure, and site was verified.   Injection Procedure Details:   Procedure diagnoses: Lumbar radiculopathy [M54.16]    Meds Administered:  Meds ordered this encounter  Medications   methylPREDNISolone acetate (DEPO-MEDROL) injection 80 mg    Laterality: Right  Location/Site: L3  Needle:5.0 in., 22 ga.  Short bevel or Quincke spinal needle  Needle Placement: Transforaminal  Findings:    -Comments: Excellent flow of contrast along the nerve, nerve root and into the epidural space.  Procedure Details: After squaring off the end-plates to get a true AP view, the C-arm was positioned so that an oblique view of the foramen as noted above was visualized. The target area is just inferior to the "nose of the scotty dog" or sub pedicular. The soft tissues overlying this structure were infiltrated with 2-3 ml. of 1% Lidocaine without Epinephrine.  The spinal needle was inserted toward the target using a "trajectory" view along the fluoroscope beam.  Under AP and lateral visualization, the needle was advanced so it did not puncture dura and was located close the 6 O'Clock position of the pedical in AP tracterory. Biplanar projections were used to confirm position. Aspiration was confirmed to be negative for CSF and/or blood. A 1-2 ml. volume of Isovue-250 was injected and flow of contrast was noted at each level. Radiographs were obtained for documentation purposes.   After attaining the desired flow  of contrast documented above, a 0.5 to 1.0 ml test dose of 0.25% Marcaine was injected into each respective transforaminal space.  The patient was observed for 90 seconds post injection.  After no sensory deficits were reported, and normal lower extremity motor function was noted,   the above injectate was administered so that equal amounts of the injectate were placed at each foramen (level) into the transforaminal epidural space.   Additional Comments:  The patient tolerated the procedure well Dressing: 2 x 2 sterile gauze and Band-Aid    Post-procedure details: Patient was observed during the procedure. Post-procedure instructions were reviewed.  Patient left the clinic in stable condition.

## 2022-01-09 ENCOUNTER — Other Ambulatory Visit: Payer: Self-pay | Admitting: Family Medicine

## 2022-01-14 ENCOUNTER — Other Ambulatory Visit: Payer: Self-pay | Admitting: Family Medicine

## 2022-01-18 ENCOUNTER — Encounter: Payer: Self-pay | Admitting: Family Medicine

## 2022-01-18 ENCOUNTER — Ambulatory Visit (INDEPENDENT_AMBULATORY_CARE_PROVIDER_SITE_OTHER): Payer: Medicare HMO | Admitting: Family Medicine

## 2022-01-18 VITALS — BP 158/82 | HR 61 | Temp 97.6°F | Resp 16 | Ht 61.0 in | Wt 193.4 lb

## 2022-01-18 DIAGNOSIS — M545 Low back pain, unspecified: Secondary | ICD-10-CM | POA: Diagnosis not present

## 2022-01-18 DIAGNOSIS — E782 Mixed hyperlipidemia: Secondary | ICD-10-CM | POA: Diagnosis not present

## 2022-01-18 DIAGNOSIS — I10 Essential (primary) hypertension: Secondary | ICD-10-CM

## 2022-01-18 DIAGNOSIS — R7989 Other specified abnormal findings of blood chemistry: Secondary | ICD-10-CM

## 2022-01-18 DIAGNOSIS — R739 Hyperglycemia, unspecified: Secondary | ICD-10-CM

## 2022-01-18 DIAGNOSIS — E038 Other specified hypothyroidism: Secondary | ICD-10-CM

## 2022-01-18 DIAGNOSIS — E871 Hypo-osmolality and hyponatremia: Secondary | ICD-10-CM

## 2022-01-18 LAB — COMPREHENSIVE METABOLIC PANEL
ALT: 18 U/L (ref 0–35)
AST: 15 U/L (ref 0–37)
Albumin: 4 g/dL (ref 3.5–5.2)
Alkaline Phosphatase: 71 U/L (ref 39–117)
BUN: 19 mg/dL (ref 6–23)
CO2: 33 mEq/L — ABNORMAL HIGH (ref 19–32)
Calcium: 8.7 mg/dL (ref 8.4–10.5)
Chloride: 92 mEq/L — ABNORMAL LOW (ref 96–112)
Creatinine, Ser: 0.63 mg/dL (ref 0.40–1.20)
GFR: 85.55 mL/min (ref >60.00)
Glucose, Bld: 96 mg/dL (ref 70–99)
Potassium: 4.6 mEq/L (ref 3.5–5.1)
Sodium: 131 mEq/L — ABNORMAL LOW (ref 135–145)
Total Bilirubin: 0.4 mg/dL (ref 0.2–1.2)
Total Protein: 6.1 g/dL (ref 6.0–8.3)

## 2022-01-18 LAB — CBC WITH DIFFERENTIAL/PLATELET
Basophils Absolute: 0.1 10*3/uL (ref 0.0–0.1)
Basophils Relative: 0.7 % (ref 0.0–3.0)
Eosinophils Absolute: 0.2 10*3/uL (ref 0.0–0.7)
Eosinophils Relative: 2.7 % (ref 0.0–5.0)
HCT: 36.8 % (ref 36.0–46.0)
Hemoglobin: 11.9 g/dL — ABNORMAL LOW (ref 12.0–15.0)
Lymphocytes Relative: 16.8 % (ref 12.0–46.0)
Lymphs Abs: 1.5 10*3/uL (ref 0.7–4.0)
MCHC: 32.2 g/dL (ref 30.0–36.0)
MCV: 85.9 fl (ref 78.0–100.0)
Monocytes Absolute: 0.8 10*3/uL (ref 0.1–1.0)
Monocytes Relative: 9.1 % (ref 3.0–12.0)
Neutro Abs: 6.2 10*3/uL (ref 1.4–7.7)
Neutrophils Relative %: 70.7 % (ref 43.0–77.0)
Platelets: 305 10*3/uL (ref 150.0–400.0)
RBC: 4.28 Mil/uL (ref 3.87–5.11)
RDW: 13.8 % (ref 11.5–15.5)
WBC: 8.8 10*3/uL (ref 4.0–10.5)

## 2022-01-18 LAB — HEMOGLOBIN A1C: Hgb A1c MFr Bld: 6 % (ref 4.6–6.5)

## 2022-01-18 LAB — LIPID PANEL
Cholesterol: 117 mg/dL (ref 0–200)
HDL: 60.1 mg/dL (ref >39.00)
LDL Cholesterol: 43 mg/dL (ref 0–99)
NonHDL: 56.83
Total CHOL/HDL Ratio: 2
Triglycerides: 69 mg/dL (ref 0.0–149.0)
VLDL: 13.8 mg/dL (ref 0.0–40.0)

## 2022-01-18 LAB — T4, FREE: Free T4: 2.37 ng/dL — ABNORMAL HIGH (ref 0.60–1.60)

## 2022-01-18 LAB — TSH: TSH: 0.1 u[IU]/mL — ABNORMAL LOW (ref 0.35–5.50)

## 2022-01-18 NOTE — Patient Instructions (Addendum)
[employer] upper arm automated cuff, can check their website and all retailers carry their products  Check BP in resting, comfortable position with feet on floor, arm neutral sit roughly 10-15 minutes and check, recheck high a couple times a week send [place] numbers in a couple weeks with pulse numbers Ideal normal is <140/90  Hypertension, Adult High blood pressure (hypertension) is when the force of blood pumping through the arteries is too strong. The arteries are the blood vessels that carry blood from the heart throughout the body. Hypertension forces the heart to work harder to pump blood and may cause arteries to become narrow or stiff. Untreated or uncontrolled hypertension can cause a heart attack, heart failure, a stroke, kidney disease, and other problems. A blood pressure reading consists of a higher number over a lower number. Ideally, your blood pressure should be below 120/80. The first ("top") number is called the systolic pressure. It is a measure of the pressure in your arteries as your heart beats. The second ("bottom") number is called the diastolic pressure. It is a measure of the pressure in your arteries as the heart relaxes. What are the causes? The exact cause of this condition is not known. There are some conditions that result in or are related to high blood pressure. What increases the risk? Some risk factors for high blood pressure are under your control. The following factors may make you more likely to develop this condition: Smoking. Having type 2 diabetes mellitus, high cholesterol, or both. Not getting enough exercise or physical activity. Being overweight. Having too much fat, sugar, calories, or salt (sodium) in your diet. Drinking too much alcohol. Some risk factors for high blood pressure may be difficult or impossible to change. Some of these factors include: Having chronic kidney disease. Having a family history of high blood pressure. Age. Risk increases with  age. Race. You may be at higher risk if you are African American. Gender. Men are at higher risk than women before age 70. After age 28, women are at higher risk than men. Having obstructive sleep apnea. Stress. What are the signs or symptoms? High blood pressure may not cause symptoms. Very high blood pressure (hypertensive crisis) may cause: Headache. Anxiety. Shortness of breath. Nosebleed. Nausea and vomiting. Vision changes. Severe chest pain. Seizures. How is this diagnosed? This condition is diagnosed by measuring your blood pressure while you are seated, with your arm resting on a flat surface, your legs uncrossed, and your feet flat on the floor. The cuff of the blood pressure monitor will be placed directly against the skin of your upper arm at the level of your heart. It should be measured at least twice using the same arm. Certain conditions can cause a difference in blood pressure between your right and left arms. Certain factors can cause blood pressure readings to be lower or higher than normal for a short period of time: When your blood pressure is higher when you are in a health care provider's office than when you are at home, this is called white coat hypertension. Most people with this condition do not need medicines. When your blood pressure is higher at home than when you are in a health care provider's office, this is called masked hypertension. Most people with this condition may need medicines to control blood pressure. If you have a high blood pressure reading during one visit or you have normal blood pressure with other risk factors, you may be asked to: Return on a different  day to have your blood pressure checked again. Monitor your blood pressure at home for 1 week or longer. If you are diagnosed with hypertension, you may have other blood or imaging tests to help your health care provider understand your overall risk for other conditions. How is this  treated? This condition is treated by making healthy lifestyle changes, such as eating healthy foods, exercising more, and reducing your alcohol intake. Your health care provider may prescribe medicine if lifestyle changes are not enough to get your blood pressure under control, and if: Your systolic blood pressure is above 130. Your diastolic blood pressure is above 80. Your personal target blood pressure may vary depending on your medical conditions, your age, and other factors. Follow these instructions at home: Eating and drinking  Eat a diet that is high in fiber and potassium, and low in sodium, added sugar, and fat. An example eating plan is called the DASH (Dietary Approaches to Stop Hypertension) diet. To eat this way: Eat plenty of fresh fruits and vegetables. Try to fill one half of your plate at each meal with fruits and vegetables. Eat whole grains, such as whole-wheat pasta, brown rice, or whole-grain bread. Fill about one fourth of your plate with whole grains. Eat or drink low-fat dairy products, such as skim milk or low-fat yogurt. Avoid fatty cuts of meat, processed or cured meats, and poultry with skin. Fill about one fourth of your plate with lean proteins, such as fish, chicken without skin, beans, eggs, or tofu. Avoid pre-made and processed foods. These tend to be higher in sodium, added sugar, and fat. Reduce your daily sodium intake. Most people with hypertension should eat less than 1,500 mg of sodium a day. Do not drink alcohol if: Your health care provider tells you not to drink. You are pregnant, may be pregnant, or are planning to become pregnant. If you drink alcohol: Limit how much you use to: 0-1 drink a day for women. 0-2 drinks a day for men. Be aware of how much alcohol is in your drink. In the [place] one drink equals one 12 oz bottle of beer (355 mL), one 5 oz glass of wine (148 mL), or one 1 oz glass of hard liquor (44 mL). Lifestyle  Work with your  health care provider to maintain a healthy body weight or to lose weight. Ask what an ideal weight is for you. Get at least 30 minutes of exercise most days of the week. Activities may include walking, swimming, or biking. Include exercise to strengthen your muscles (resistance exercise), such as Pilates or lifting weights, as part of your weekly exercise routine. Try to do these types of exercises for 30 minutes at least 3 days a week. Do not use any products that contain nicotine or tobacco, such as cigarettes, e-cigarettes, and chewing tobacco. If you need help quitting, ask your health care provider. Monitor your blood pressure at home as told by your health care provider. Keep all follow-up visits as told by your health care provider. This is important. Medicines Take over-the-counter and prescription medicines only as told by your health care provider. Follow directions carefully. Blood pressure medicines must be taken as prescribed. Do not skip doses of blood pressure medicine. Doing this puts you at risk for problems and can make the medicine less effective. Ask your health care provider about side effects or reactions to medicines that you should watch for. Contact a health care provider if you: Think you are having a reaction  to a medicine you are taking. Have headaches that keep coming back (recurring). Feel dizzy. Have swelling in your ankles. Have trouble with your vision. Get help right away if you: Develop a severe headache or confusion. Have unusual weakness or numbness. Feel faint. Have severe pain in your chest or abdomen. Vomit repeatedly. Have trouble breathing. Summary Hypertension is when the force of blood pumping through your arteries is too strong. If this condition is not controlled, it may put you at risk for serious complications. Your personal target blood pressure may vary depending on your medical conditions, your age, and other factors. For most people, a  normal blood pressure is less than 120/80. Hypertension is treated with lifestyle changes, medicines, or a combination of both. Lifestyle changes include losing weight, eating a healthy, low-sodium diet, exercising more, and limiting alcohol. This information is not intended to replace advice given to you by your health care provider. Make sure you discuss any questions you have with your health care provider. Document Revised: 08/09/2018 Document Reviewed: 08/09/2018 Elsevier Patient Education  [place].

## 2022-01-18 NOTE — Assessment & Plan Note (Signed)
hgba1c acceptable, minimize simple carbs. Increase exercise as tolerated.  

## 2022-01-18 NOTE — Progress Notes (Signed)
Subjective:   By signing my name below, I, Zite Okoli, attest that this documentation has been prepared under the direction and in the presence of Mosie Lukes, MD. 01/18/2022      Patient ID: Jackie Stevens, female    DOB: 08-01-1946, 76 y.o.   MRN: [id number]  Chief Complaint  Patient presents with   4 months follow up    HPI Patient is in today for an office visit and 4 month f/u. She is accompanied by her son.  Her blood pressure is elevated at the beginning of this visit. BP Readings from Last 3 Encounters:  01/18/22 (!) 158/82  01/07/22 (!) 202/82  12/23/21 (!) 184/78    She had a cold last week and had symptoms of cough and sinus drainage. Was negative for Covid-19. No residual symptoms at this time.   She reports that since she received her steroid injections, her lower back and hip pain have reduced.  He is UTD on shingles, pneumonia and tetanus vaccines. She has received the flu vaccine. She has 3 Covid-19 vaccines at this time.   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  Loss of glenohumeral joint space.  Well-maintained subacromial space.  Injected 09/30/2016 [name] .   Asthma, mild intermittent 04/02/2013   Chicken pox as a child   Colonic diverticular abscess 11/21/2012   Constipation    Decreased hearing    Dermatitis 03/06/2017   Diverticulitis 10/25/2012   pt. reports that a drain was placed - 09/2012     Diverticulitis of rectosigmoid 11/28/2012    Dry mouth    Essential hypertension 11/08/2010   Qualifier: Diagnosis of  By: Annamaria Boots MD, Clinton D    Excessive thirst    External hemorrhoid, bleeding    "sometimes" (Jan 01, 2013)   Frequent urination    GERD (gastroesophageal reflux disease)    H/O hiatal hernia    Hand tingling    Heart murmur    History of kidney stones    Hyperglycemia 01/17/2020   Hyperlipidemia    Hypothyroidism    Incontinence    Insomnia    Kidney stones 1970's   "passed on their own" (01-01-2013)   Low back pain 06/09/2014   Measles as a child   Mild neurocognitive disorder 02/05/2020   Obesity 09/11/2017   Obstructive sleep apnea 11/04/2010   NPSG [facility] 07/15/10- AHI 13.5/ hr CPAP 10/APS     Otitis media 01/07/2014   Palpitations    Paresthesia 03/23/2015   Left face   PONV (postoperative nausea and vomiting)    Rheumatoid arteritis    Shortness of breath dyspnea    using stairs   Sinus pain    Swelling of both lower extremities    Thyroid cancer 1980's   Tinnitus    UTI (urinary tract infection) 04/02/2013   Vertigo 08/05/2019   Vitamin D deficiency 02/28/2018    Past Surgical History:  Procedure Laterality Date  CHOLECYSTECTOMY  1990   COLON SURGERY     COLOSTOMY REVISION  12/19/2012   Procedure: COLON RESECTION SIGMOID;  Surgeon: Gwenyth Ober, MD;  Location: [facility];  Service: General;  Laterality: N/A;   CYSTOSCOPY WITH STENT PLACEMENT  12/19/2012   Procedure: CYSTOSCOPY WITH STENT PLACEMENT;  Surgeon: Hanley Ben, MD;  Location: [facility];  Service: Urology;  Laterality: N/A;   DILATION AND CURETTAGE OF UTERUS  1960's   "lots of them; had miscarriages" (12/19/2012)   LYSIS OF ADHESION N/A 12/02/2015   Procedure: LAPAROSCOPIC LYSIS OF ADHESION;  Surgeon: Johnathan Hausen, MD;  Location: [facility];  Service: General;  Laterality: N/A;   ROBOTIC ASSISTED BILATERAL SALPINGO OOPHERECTOMY Bilateral 12/02/2015   Procedure: XI ROBOTIC ASSISTED BILATERAL SALPINGO OOPHORECTOMY;  Surgeon: Everitt Amber, MD;  Location: [facility];  Service: Gynecology;  Laterality: Bilateral;   SIGMOID RESECTION / RECTOPEXY  12/19/2012   THYROIDECTOMY, PARTIAL  1988   "then did iodine to remove the rest" (12/19/2012)   TONSILLECTOMY  1951?   TOTAL KNEE ARTHROPLASTY Left 01/19/2021   Procedure: TOTAL KNEE ARTHROPLASTY;  Surgeon: Gaynelle Arabian, MD;  Location: [facility];  Service: Orthopedics;  Laterality: Left;  30min   TRANSRECTAL DRAINAGE OF PELVIC ABSCESS  10/27/2012   VAGINAL HYSTERECTOMY  1970's   "still have my ovaries" (12/19/2012)    Family History  Problem Relation Age of Onset   Heart disease Father    Pneumonia Father    Hypertension Father    Hyperlipidemia Father    Cancer Father        skin   Stroke Father    Alzheimer's disease Mother    Heart disease Mother    Depression Mother    Emphysema Brother        marijuana and cigarettes   Alcohol abuse Brother    Hearing loss Brother    Diabetes Maternal Grandmother    Alzheimer's disease Paternal Grandmother    Cancer Paternal Grandmother        lung?- smoker   Hyperlipidemia Paternal Grandmother    Heart attack Paternal Grandfather    Alcohol abuse Paternal Grandfather    Neurofibromatosis Son        schwanomatosis   Neurofibromatosis Son        swanomatosis   Cancer Paternal Aunt     Social History   Socioeconomic History   Marital status: Married    Spouse name: [name]   Number of children: 2   Years of education: 12   Highest education level: High school graduate  Occupational History   Occupation: Retired  Tobacco Use   Smoking status: Never   Smokeless tobacco: Never  Scientific laboratory technician Use: Never used  Substance and Sexual Activity   Alcohol use: No    Alcohol/week: 0.0 standard drinks   Drug use: No   Sexual activity: Not Currently  Other Topics Concern   Not on file  Social History Narrative   Married    Children   Right handed   Some college   Social Determinants of Health   Financial Resource Strain: Not on file  Food  Insecurity: Not on file  Transportation Needs: Not on file  Physical Activity: Not on file  Stress: Not on file  Social Connections: Not on file  Intimate Partner Violence: Not on file    Outpatient Medications Prior to Visit  Medication Sig Dispense Refill   Azelastine HCl 137 MCG/SPRAY SOLN PLACE 2 SPRAYS INTO BOTH NOSTRILS 2 (  TWO) TIMES DAILY. 90 mL 4   Black Pepper-Turmeric (TURMERIC CURCUMIN) 04-999 MG CAPS 1 tablet daily.     carboxymethylcellulose (REFRESH PLUS) 0.5 % SOLN Place 1 drop into both eyes 3 (three) times daily as needed (dry eyes).     Cholecalciferol-Vitamin C (VITAMIN D3-VITAMIN C) 1000-500 UNIT-MG CAPS daily.     docusate sodium (COLACE) 100 MG capsule Take 100 mg by mouth daily.     famotidine (PEPCID) 20 MG tablet TAKE 2 TABLETS BY MOUTH EVERY DAY 180 tablet 1   fluticasone (FLONASE) 50 MCG/ACT nasal spray PRN (seasonal)     gabapentin (NEURONTIN) 300 MG capsule Take 1 capsule (300 mg total) by mouth at bedtime. 30 capsule 1   hydrochlorothiazide (HYDRODIURIL) 25 MG tablet TAKE 1 TABLET (25 MG TOTAL) BY MOUTH DAILY. 90 tablet 1   levothyroxine (SYNTHROID, LEVOTHROID) 150 MCG tablet Take 150 mcg by mouth daily before breakfast.     losartan (COZAAR) 100 MG tablet TAKE 1 TABLET BY MOUTH EVERYDAY AT BEDTIME 90 tablet 1   metoprolol tartrate (LOPRESSOR) 100 MG tablet TAKE 1&1/2 TABLETS BY MOUTH TWICE A DAY 270 tablet 1   naproxen (NAPROSYN) 375 MG tablet TAKE 1 TABLET (375 MG TOTAL) BY MOUTH 2 (TWO) TIMES DAILY AS NEEDED FOR MODERATE PAIN OR HEADACHE. 60 tablet 3   nitroGLYCERIN (NITROSTAT) 0.4 MG SL tablet PLACE 1 TABLET UNDER THE TONGUE EVERY 5 MINUTES AS NEEDED FOR CHEST PAIN 25 tablet 1   omeprazole (PRILOSEC) 40 MG capsule TAKE 1 CAPSULE BY MOUTH TWICE A DAY 180 capsule 1   polyethylene glycol (MIRALAX / GLYCOLAX) 17 g packet Take 17 g by mouth daily.     Psyllium-Calcium (METAMUCIL PLUS CALCIUM) CAPS daily.     rosuvastatin (CRESTOR) 40 MG tablet TAKE 1 TABLET BY  MOUTH EVERY DAY 90 tablet 1   temazepam (RESTORIL) 7.5 MG capsule 1 po every other day for 1 week, then every third day for 1 week. 7 capsule 0   predniSONE (DELTASONE) 50 MG tablet Take once daily for 5 days. 5 tablet 0   meclizine (ANTIVERT) 25 MG tablet take one a day     oxyCODONE (OXY IR/ROXICODONE) 5 MG immediate release tablet Take 1-2 tablets (5-10 mg total) by mouth every 6 (six) hours as needed for severe pain. 42 tablet 0   No facility-administered medications prior to visit.    Allergies  Allergen Reactions   Neomycin-Bacitracin Zn-Polymyx Rash    Polysporin- is tolerated    Niacin Other (See Comments) and Cough    "cough til I threw up" (12/19/2012)   Ciprofloxacin Hives    Got cipro and flagyl at same time, localized hives to IV arm   Flagyl [Metronidazole] Hives    Got cipro and flagyl at same time, localized hives to IV arm    Review of Systems  Constitutional:  Negative for fever.  HENT:  Negative for congestion.   Eyes:  Negative for pain.  Respiratory:  Negative for shortness of breath.   Cardiovascular:  Negative for chest pain, palpitations and leg swelling.  Gastrointestinal:  Negative for abdominal pain, blood in stool, diarrhea and nausea.  Genitourinary:  Negative for dysuria and frequency.  Musculoskeletal:  Negative for back pain.  Neurological:  Negative for headaches.      Objective:    Physical Exam Constitutional:      General: She is not in acute distress.    Appearance: She is well-developed.  HENT:     Head: Normocephalic and  atraumatic.  Eyes:     Conjunctiva/sclera: Conjunctivae normal.  Neck:     Thyroid: No thyromegaly.  Cardiovascular:     Rate and Rhythm: Normal rate and regular rhythm.     Heart sounds: Normal heart sounds. No murmur heard. Pulmonary:     Effort: Pulmonary effort is normal. No respiratory distress.     Breath sounds: Normal breath sounds.  Abdominal:     General: Bowel sounds are normal. There is no  distension.     Palpations: Abdomen is soft. There is no mass.     Tenderness: There is no abdominal tenderness.  Musculoskeletal:     Cervical back: Neck supple.  Lymphadenopathy:     Cervical: No cervical adenopathy.  Skin:    General: Skin is warm and dry.  Neurological:     Mental Status: She is alert and oriented to person, place, and time.  Psychiatric:        Behavior: Behavior normal.    BP (!) 158/82 (BP Location: Left Arm, Patient Position: Sitting)    Pulse 61    Temp 97.6 F (36.4 C)    Resp 16    Ht 5\' 1"  (1.549 m)    Wt 193 lb 6.4 oz (87.7 kg)    SpO2 98%    BMI 36.54 kg/m  Wt Readings from Last 3 Encounters:  01/18/22 193 lb 6.4 oz (87.7 kg)  10/28/21 188 lb 6.4 oz (85.5 kg)  10/15/21 188 lb 6.4 oz (85.5 kg)    Diabetic Foot Exam - Simple   No data filed    Lab Results  Component Value Date   WBC 8.8 01/18/2022   HGB 11.9 (L) 01/18/2022   HCT 36.8 01/18/2022   PLT 305.0 01/18/2022   GLUCOSE 96 01/18/2022   CHOL 117 01/18/2022   TRIG 69.0 01/18/2022   HDL 60.10 01/18/2022   LDLDIRECT 81.0 02/12/2020   LDLCALC 43 01/18/2022   ALT 18 01/18/2022   AST 15 01/18/2022   NA 131 (L) 01/18/2022   K 4.6 01/18/2022   CL 92 (L) 01/18/2022   CREATININE 0.63 01/18/2022   BUN 19 01/18/2022   CO2 33 (H) 01/18/2022   TSH 0.10 (L) 01/18/2022   INR 1.0 01/12/2021   HGBA1C 6.0 01/18/2022    Lab Results  Component Value Date   TSH 0.10 (L) 01/18/2022   Lab Results  Component Value Date   WBC 8.8 01/18/2022   HGB 11.9 (L) 01/18/2022   HCT 36.8 01/18/2022   MCV 85.9 01/18/2022   PLT 305.0 01/18/2022   Lab Results  Component Value Date   NA 131 (L) 01/18/2022   K 4.6 01/18/2022   CO2 33 (H) 01/18/2022   GLUCOSE 96 01/18/2022   BUN 19 01/18/2022   CREATININE 0.63 01/18/2022   BILITOT 0.4 01/18/2022   ALKPHOS 71 01/18/2022   AST 15 01/18/2022   ALT 18 01/18/2022   PROT 6.1 01/18/2022   ALBUMIN 4.0 01/18/2022   CALCIUM 8.7 01/18/2022   ANIONGAP 8  01/20/2021   GFR 85.55 01/18/2022   Lab Results  Component Value Date   CHOL 117 01/18/2022   Lab Results  Component Value Date   HDL 60.10 01/18/2022   Lab Results  Component Value Date   LDLCALC 43 01/18/2022   Lab Results  Component Value Date   TRIG 69.0 01/18/2022   Lab Results  Component Value Date   CHOLHDL 2 01/18/2022   Lab Results  Component Value Date   HGBA1C  6.0 01/18/2022       Assessment & Plan:   Problem List Items Addressed This Visit     Hyperlipidemia, mixed    Tolerating statin, encouraged heart healthy diet, avoid trans fats, minimize simple carbs and saturated fats. Increase exercise as tolerated      Relevant Orders   CBC with Differential/Platelet (Completed)   Comprehensive metabolic panel (Completed)   TSH (Completed)   Lipid panel (Completed)   Essential hypertension - Primary   Relevant Orders   CBC with Differential/Platelet (Completed)   Comprehensive metabolic panel (Completed)   TSH (Completed)   Lipid panel (Completed)   Hypothyroidism    On Levothyroxine, continue to monitor, overtreated. Decrease Levothyroxine to 137 mcg daily      Low back pain    Has recently had some steroid injections with Dr Ninfa Linden of ortho and her pain is greatly improved.       Hyponatremia    Mild, asymptomatic, will monitor drop beverages by one glass daily      Hyperglycemia    hgba1c acceptable, minimize simple carbs. Increase exercise as tolerated.       Relevant Orders   Hemoglobin A1c (Completed)   Other Visit Diagnoses     Low TSH level       Relevant Orders   T4, free (Completed)         No orders of the defined types were placed in this encounter.   I,Zite Okoli,acting as a Education administrator for Penni Homans, MD.,have documented all relevant documentation on the behalf of Penni Homans, MD,as directed by  Penni Homans, MD while in the presence of Penni Homans, MD.   I, Mosie Lukes, MD., personally preformed the services  described in this documentation.  All medical record entries made by the scribe were at my direction and in my presence.  I have reviewed the chart and discharge instructions (if applicable) and agree that the record reflects my personal performance and is accurate and complete. 01/18/2022

## 2022-01-18 NOTE — Assessment & Plan Note (Signed)
On Levothyroxine, continue to monitor, overtreated. Decrease Levothyroxine to 137 mcg daily

## 2022-01-18 NOTE — Assessment & Plan Note (Signed)
Has recently had some steroid injections with Dr Ninfa Linden of ortho and her pain is greatly improved.

## 2022-01-18 NOTE — Assessment & Plan Note (Signed)
Tolerating statin, encouraged heart healthy diet, avoid trans fats, minimize simple carbs and saturated fats. Increase exercise as tolerated 

## 2022-01-18 NOTE — Assessment & Plan Note (Signed)
Mild, asymptomatic, will monitor drop beverages by one glass daily

## 2022-01-20 ENCOUNTER — Other Ambulatory Visit: Payer: Self-pay | Admitting: Family Medicine

## 2022-01-20 DIAGNOSIS — E782 Mixed hyperlipidemia: Secondary | ICD-10-CM

## 2022-01-20 DIAGNOSIS — R739 Hyperglycemia, unspecified: Secondary | ICD-10-CM

## 2022-01-20 DIAGNOSIS — R7989 Other specified abnormal findings of blood chemistry: Secondary | ICD-10-CM

## 2022-01-20 DIAGNOSIS — I10 Essential (primary) hypertension: Secondary | ICD-10-CM

## 2022-01-20 MED ORDER — LEVOTHYROXINE SODIUM 137 MCG PO TABS: 137.0000 ug | ORAL_TABLET | Freq: Every day | ORAL | 1 refills | Status: DC

## 2022-01-21 ENCOUNTER — Telehealth: Payer: Self-pay | Admitting: Family Medicine

## 2022-01-21 NOTE — Telephone Encounter (Signed)
Spoke with and he aware of lab results and made lab appointment for pt

## 2022-01-21 NOTE — Telephone Encounter (Signed)
Pt's son states she has dementia and she cannot remember what was discussed yesterday. He was hoping someone could go over the appointment with him so he can help her. Did not see him on dpr but she was in the room with him. Please advise. Laurey Arrow can be reached at [phone]

## 2022-01-25 DIAGNOSIS — G4733 Obstructive sleep apnea (adult) (pediatric): Secondary | ICD-10-CM | POA: Diagnosis not present

## 2022-01-27 ENCOUNTER — Telehealth: Payer: Self-pay | Admitting: Physical Medicine and Rehabilitation

## 2022-01-27 NOTE — Telephone Encounter (Signed)
Patient's daughter called. Patient is better. Cancel appointment with Dr. Ernestina Patches

## 2022-01-28 ENCOUNTER — Ambulatory Visit: Payer: Medicare HMO | Admitting: Physical Medicine and Rehabilitation

## 2022-01-29 DIAGNOSIS — H5203 Hypermetropia, bilateral: Secondary | ICD-10-CM | POA: Diagnosis not present

## 2022-01-29 DIAGNOSIS — H2513 Age-related nuclear cataract, bilateral: Secondary | ICD-10-CM | POA: Diagnosis not present

## 2022-02-06 ENCOUNTER — Other Ambulatory Visit: Payer: Self-pay | Admitting: Family Medicine

## 2022-03-06 ENCOUNTER — Other Ambulatory Visit: Payer: Self-pay | Admitting: Family Medicine

## 2022-03-08 ENCOUNTER — Other Ambulatory Visit: Payer: Self-pay | Admitting: Family Medicine

## 2022-03-09 ENCOUNTER — Telehealth: Payer: Self-pay

## 2022-03-09 DIAGNOSIS — R69 Illness, unspecified: Secondary | ICD-10-CM | POA: Diagnosis not present

## 2022-03-09 DIAGNOSIS — I10 Essential (primary) hypertension: Secondary | ICD-10-CM | POA: Diagnosis not present

## 2022-03-09 DIAGNOSIS — G8929 Other chronic pain: Secondary | ICD-10-CM | POA: Diagnosis not present

## 2022-03-09 DIAGNOSIS — K5900 Constipation, unspecified: Secondary | ICD-10-CM | POA: Diagnosis not present

## 2022-03-09 DIAGNOSIS — G629 Polyneuropathy, unspecified: Secondary | ICD-10-CM | POA: Diagnosis not present

## 2022-03-09 DIAGNOSIS — J309 Allergic rhinitis, unspecified: Secondary | ICD-10-CM | POA: Diagnosis not present

## 2022-03-09 DIAGNOSIS — G4733 Obstructive sleep apnea (adult) (pediatric): Secondary | ICD-10-CM | POA: Diagnosis not present

## 2022-03-09 DIAGNOSIS — E039 Hypothyroidism, unspecified: Secondary | ICD-10-CM | POA: Diagnosis not present

## 2022-03-09 DIAGNOSIS — K219 Gastro-esophageal reflux disease without esophagitis: Secondary | ICD-10-CM | POA: Diagnosis not present

## 2022-03-09 DIAGNOSIS — I700 Atherosclerosis of aorta: Secondary | ICD-10-CM | POA: Diagnosis not present

## 2022-03-09 DIAGNOSIS — E669 Obesity, unspecified: Secondary | ICD-10-CM | POA: Diagnosis not present

## 2022-03-09 DIAGNOSIS — E785 Hyperlipidemia, unspecified: Secondary | ICD-10-CM | POA: Diagnosis not present

## 2022-03-09 NOTE — Telephone Encounter (Signed)
Pt's son called in states that she had a nurse from pt insurance came out and  wanted him to report her bp. ? ?Bp was 199/95, 200/98 ? ?

## 2022-03-11 NOTE — Telephone Encounter (Signed)
Lvm to call back

## 2022-03-11 NOTE — Telephone Encounter (Signed)
Spoke with pt she states that wasn't having headaches,fever, or sob. Pt states that she did get her bp check after the last time but she could not remember the reading. Pt states that she will have her son check when he gets off so we could a reading.  ?

## 2022-03-12 NOTE — Telephone Encounter (Signed)
Patient's son states her last few bps were 187/90, 200/86, 196/87. He states they were all consistently high. His number is [phone], he states his has dementia and forgets to tell him when we call so to call him instead.  ?

## 2022-03-17 NOTE — Telephone Encounter (Signed)
Called pt's son. No answer but left VM requesting pulse rates for his mom. ?

## 2022-03-18 ENCOUNTER — Telehealth: Payer: Self-pay

## 2022-03-18 DIAGNOSIS — I10 Essential (primary) hypertension: Secondary | ICD-10-CM

## 2022-03-18 NOTE — Telephone Encounter (Signed)
Caller Name Mithra Spano ?Caller Phone Number [phone] ?Patient Name Jackie Stevens ?Patient DOB 03-17-1946 ?Call Type Message Only Information Provided ?Reason for Call Request for General Office Information ?Initial Comment Caller states his mother is needing to give blood pressure reading to provider. 3/23 188/88 3/24 ?179/86 3/27 200/102 3/28 197/90 3/29 179/86 4/5 183/86 ?Additional Comment hrs . prov ?Disp. Time Disposition Final User ?03/17/2022 5:17:03 PM General Information Provided Yes Idolina Primer ?Call Closed By: Idolina Primer ?Transaction Date/Time: 03/17/2022 5:13:46 PM (ET) ?

## 2022-03-23 ENCOUNTER — Other Ambulatory Visit: Payer: Self-pay

## 2022-03-23 MED ORDER — AMLODIPINE BESYLATE 2.5 MG PO TABS: 2.5000 mg | ORAL_TABLET | Freq: Every day | ORAL | 2 refills | Status: DC

## 2022-03-23 MED ORDER — AMLODIPINE BESYLATE 5 MG PO TABS: 5.0000 mg | ORAL_TABLET | Freq: Every day | ORAL | 2 refills | Status: DC

## 2022-03-23 NOTE — Telephone Encounter (Signed)
Left VM for pt's son regarding new medication and follow up with readings. ?

## 2022-03-23 NOTE — Addendum Note (Signed)
Addended by: Lynnea Ferrier R on: 03/23/2022 04:48 PM ? ? Modules accepted: Orders ? ?

## 2022-03-24 ENCOUNTER — Telehealth: Payer: Self-pay | Admitting: *Deleted

## 2022-03-24 NOTE — Telephone Encounter (Signed)
Caller Name [name] ?Caller Phone Number [phone] ?Patient Name Jackie Stevens ?Patient DOB [birth year] ?Call Type Message Only Information Provided ?Reason for Call Request for General Office Information ?Initial Comment Caller is requesting a call back regarding in home care for his mother. ?Additional Comment Caller is requesting a call back Provided Office Hours ?Disp. Time Disposition Final User ?03/24/2022 12:04:44 PM General Information Provided Yes Merwyn Katos ?

## 2022-03-26 ENCOUNTER — Telehealth: Payer: Self-pay

## 2022-03-26 DIAGNOSIS — R2681 Unsteadiness on feet: Secondary | ICD-10-CM

## 2022-03-26 DIAGNOSIS — G3184 Mild cognitive impairment, so stated: Secondary | ICD-10-CM

## 2022-03-26 DIAGNOSIS — R410 Disorientation, unspecified: Secondary | ICD-10-CM

## 2022-03-26 NOTE — Telephone Encounter (Signed)
Pt son called stating that her mom's dementia has gotten worse. They are requesting HOME HEALTH to go out to the house several times a week to assist. Pt was last seen in February.  ? ?Orders has been pended for provider. Pt son says he is requesting assistance since he has not been through this before. His mom is using her phone to change the channel on the tv.  ?

## 2022-03-29 NOTE — Telephone Encounter (Signed)
Spoke with pt's son, he will coordinate with the other siblings to get pt here for a UA. Labs ordered. ?

## 2022-03-30 ENCOUNTER — Other Ambulatory Visit (INDEPENDENT_AMBULATORY_CARE_PROVIDER_SITE_OTHER): Payer: Medicare HMO

## 2022-03-30 DIAGNOSIS — R410 Disorientation, unspecified: Secondary | ICD-10-CM | POA: Diagnosis not present

## 2022-03-30 LAB — URINALYSIS, ROUTINE W REFLEX MICROSCOPIC
Bilirubin Urine: NEGATIVE
Hgb urine dipstick: NEGATIVE
Ketones, ur: NEGATIVE
Nitrite: NEGATIVE
RBC / HPF: NONE SEEN (ref 0–?)
Specific Gravity, Urine: 1.01 (ref 1.000–1.030)
Total Protein, Urine: NEGATIVE
Urine Glucose: NEGATIVE
Urobilinogen, UA: 0.2 (ref 0.0–1.0)
pH: 7 (ref 5.0–8.0)

## 2022-04-01 LAB — URINE CULTURE
MICRO NUMBER:: 13278683
SPECIMEN QUALITY:: ADEQUATE

## 2022-04-04 ENCOUNTER — Other Ambulatory Visit: Payer: Self-pay | Admitting: Family Medicine

## 2022-04-04 DIAGNOSIS — R109 Unspecified abdominal pain: Secondary | ICD-10-CM

## 2022-04-04 NOTE — Progress Notes (Unsigned)
Ct

## 2022-04-07 ENCOUNTER — Telehealth: Payer: Self-pay | Admitting: Family Medicine

## 2022-04-07 NOTE — Telephone Encounter (Signed)
Jackie Stevens (Jackie Stevens) called to follow up on the status of the pt's [facility] referral that was put in on 4.16.23. After reviewing the pt's referral, advised that the referral was still pending review and that it looked like it would be with [facility] in [place], [place]. Jackie Stevens then inquired as to why her [place] would be so far away as she resides in [place], [place]. Please Advise.  ?

## 2022-04-10 ENCOUNTER — Ambulatory Visit (HOSPITAL_BASED_OUTPATIENT_CLINIC_OR_DEPARTMENT_OTHER)
Admission: RE | Admit: 2022-04-10 | Discharge: 2022-04-10 | Disposition: A | Discharge status: Home / Self Care | Payer: Medicare HMO | Source: Ambulatory Visit | Attending: Family Medicine | Admitting: Family Medicine

## 2022-04-10 DIAGNOSIS — R109 Unspecified abdominal pain: Secondary | ICD-10-CM

## 2022-04-10 DIAGNOSIS — I700 Atherosclerosis of aorta: Secondary | ICD-10-CM | POA: Diagnosis not present

## 2022-04-10 DIAGNOSIS — K439 Ventral hernia without obstruction or gangrene: Secondary | ICD-10-CM | POA: Diagnosis not present

## 2022-04-10 DIAGNOSIS — K6389 Other specified diseases of intestine: Secondary | ICD-10-CM | POA: Diagnosis not present

## 2022-04-10 DIAGNOSIS — N8110 Cystocele, unspecified: Secondary | ICD-10-CM | POA: Diagnosis not present

## 2022-04-19 ENCOUNTER — Other Ambulatory Visit (INDEPENDENT_AMBULATORY_CARE_PROVIDER_SITE_OTHER): Payer: Medicare HMO

## 2022-04-19 DIAGNOSIS — E782 Mixed hyperlipidemia: Secondary | ICD-10-CM | POA: Diagnosis not present

## 2022-04-19 DIAGNOSIS — R739 Hyperglycemia, unspecified: Secondary | ICD-10-CM | POA: Diagnosis not present

## 2022-04-19 DIAGNOSIS — I10 Essential (primary) hypertension: Secondary | ICD-10-CM | POA: Diagnosis not present

## 2022-04-19 DIAGNOSIS — G4733 Obstructive sleep apnea (adult) (pediatric): Secondary | ICD-10-CM | POA: Diagnosis not present

## 2022-04-19 DIAGNOSIS — R7989 Other specified abnormal findings of blood chemistry: Secondary | ICD-10-CM | POA: Diagnosis not present

## 2022-04-19 LAB — LIPID PANEL
Cholesterol: 107 mg/dL (ref 0–200)
HDL: 51.5 mg/dL (ref >39.00)
LDL Cholesterol: 35 mg/dL (ref 0–99)
NonHDL: 55.88
Total CHOL/HDL Ratio: 2
Triglycerides: 102 mg/dL (ref 0.0–149.0)
VLDL: 20.4 mg/dL (ref 0.0–40.0)

## 2022-04-19 LAB — COMPREHENSIVE METABOLIC PANEL
ALT: 18 U/L (ref 0–35)
AST: 16 U/L (ref 0–37)
Albumin: 4 g/dL (ref 3.5–5.2)
Alkaline Phosphatase: 67 U/L (ref 39–117)
BUN: 15 mg/dL (ref 6–23)
CO2: 30 mEq/L (ref 19–32)
Calcium: 8.8 mg/dL (ref 8.4–10.5)
Chloride: 92 mEq/L — ABNORMAL LOW (ref 96–112)
Creatinine, Ser: 0.65 mg/dL (ref 0.40–1.20)
GFR: 84.76 mL/min (ref >60.00)
Glucose, Bld: 96 mg/dL (ref 70–99)
Potassium: 4.3 mEq/L (ref 3.5–5.1)
Sodium: 131 mEq/L — ABNORMAL LOW (ref 135–145)
Total Bilirubin: 0.5 mg/dL (ref 0.2–1.2)
Total Protein: 6.1 g/dL (ref 6.0–8.3)

## 2022-04-19 LAB — TSH: TSH: <0.01 u[IU]/mL — ABNORMAL LOW (ref 0.35–5.50)

## 2022-04-19 LAB — HEMOGLOBIN A1C: Hgb A1c MFr Bld: 5.9 % (ref 4.6–6.5)

## 2022-04-20 ENCOUNTER — Encounter: Payer: Self-pay | Admitting: Family Medicine

## 2022-04-20 ENCOUNTER — Ambulatory Visit (INDEPENDENT_AMBULATORY_CARE_PROVIDER_SITE_OTHER): Payer: Medicare HMO | Admitting: Family Medicine

## 2022-04-20 VITALS — BP 132/76 | HR 65 | Resp 20 | Ht 61.0 in | Wt 180.4 lb

## 2022-04-20 DIAGNOSIS — R2681 Unsteadiness on feet: Secondary | ICD-10-CM | POA: Diagnosis not present

## 2022-04-20 DIAGNOSIS — R739 Hyperglycemia, unspecified: Secondary | ICD-10-CM | POA: Diagnosis not present

## 2022-04-20 DIAGNOSIS — E559 Vitamin D deficiency, unspecified: Secondary | ICD-10-CM | POA: Diagnosis not present

## 2022-04-20 DIAGNOSIS — R5383 Other fatigue: Secondary | ICD-10-CM

## 2022-04-20 DIAGNOSIS — I10 Essential (primary) hypertension: Secondary | ICD-10-CM

## 2022-04-20 DIAGNOSIS — R0602 Shortness of breath: Secondary | ICD-10-CM

## 2022-04-20 DIAGNOSIS — E66812 Obesity, class 2: Secondary | ICD-10-CM

## 2022-04-20 DIAGNOSIS — F0394 Unspecified dementia, unspecified severity, with anxiety: Secondary | ICD-10-CM

## 2022-04-20 DIAGNOSIS — R69 Illness, unspecified: Secondary | ICD-10-CM | POA: Diagnosis not present

## 2022-04-20 DIAGNOSIS — R059 Cough, unspecified: Secondary | ICD-10-CM | POA: Diagnosis not present

## 2022-04-20 DIAGNOSIS — E538 Deficiency of other specified B group vitamins: Secondary | ICD-10-CM

## 2022-04-20 DIAGNOSIS — E038 Other specified hypothyroidism: Secondary | ICD-10-CM

## 2022-04-20 DIAGNOSIS — E782 Mixed hyperlipidemia: Secondary | ICD-10-CM

## 2022-04-20 LAB — T4: T4, Total: 28.5 ug/dL — ABNORMAL HIGH (ref 5.1–11.9)

## 2022-04-20 MED ORDER — LEVOTHYROXINE SODIUM 112 MCG PO TABS: 112.0000 ug | ORAL_TABLET | Freq: Every day | ORAL | 1 refills | Status: DC

## 2022-04-20 MED ORDER — BENZONATATE 100 MG PO CAPS: 100.0000 mg | ORAL_CAPSULE | Freq: Three times a day (TID) | ORAL | 0 refills | Status: DC | PRN

## 2022-04-20 MED ORDER — AMOXICILLIN 500 MG PO CAPS: 500.0000 mg | ORAL_CAPSULE | Freq: Three times a day (TID) | ORAL | 0 refills | Status: AC

## 2022-04-20 NOTE — Patient Instructions (Addendum)
Plain Mucinex (generic is fine) twice daily ?Probiotic daily ? ?Cough, Adult ?Coughing is a reflex that clears your throat and your airways (respiratory system). Coughing helps to heal and protect your lungs. It is normal to cough occasionally, but a cough that happens with other symptoms or lasts a long time may be a sign of a condition that needs treatment. An acute cough may only last 2-3 weeks, while a chronic cough may last 8 or more weeks. ?Coughing is commonly caused by: ?Infection of the respiratory systemby viruses or bacteria. ?Breathing in substances that irritate your lungs. ?Allergies. ?Asthma. ?Mucus that runs down the back of your throat (postnasal drip). ?Smoking. ?Acid backing up from the stomach into the esophagus (gastroesophageal reflux). ?Certain medicines. ?Chronic lung problems. ?Other medical conditions such as heart failure or a blood clot in the lung (pulmonary embolism). ?Follow these instructions at home: ?Medicines ?Take over-the-counter and prescription medicines only as told by your health care provider. ?Talk with your health care provider before you take a cough suppressant medicine. ?Lifestyle ? ?Avoid cigarette smoke. Do not use any products that contain nicotine or tobacco, such as cigarettes, e-cigarettes, and chewing tobacco. If you need help quitting, ask your health care provider. ?Drink enough fluid to keep your urine pale yellow. ?Avoid caffeine. ?Do not drink alcohol if your health care provider tells you not to drink. ?General instructions ? ?Pay close attention to changes in your cough. Tell your health care provider about them. ?Always cover your mouth when you cough. ?Avoid things that make you cough, such as perfume, candles, cleaning products, or campfire or tobacco smoke. ?If the air is dry, use a cool mist vaporizer or humidifier in your bedroom or your home to help loosen secretions. ?If your cough is worse at night, try to sleep in a semi-upright position. ?Rest  as needed. ?Keep all follow-up visits as told by your health care provider. This is important. ?Contact a health care provider if you: ?Have new symptoms. ?Cough up pus. ?Have a cough that does not get better after 2-3 weeks or gets worse. ?Cannot control your cough with cough suppressant medicines and you are losing sleep. ?Have pain that gets worse or pain that is not helped with medicine. ?Have a fever. ?Have unexplained weight loss. ?Have night sweats. ?Get help right away if: ?You cough up blood. ?You have difficulty breathing. ?Your heartbeat is very fast. ?These symptoms may represent a serious problem that is an emergency. Do not wait to see if the symptoms will go away. Get medical help right away. Call your local emergency services (911 in the [place] Do not drive yourself to the hospital. ?Summary ?Coughing is a reflex that clears your throat and your airways. It is normal to cough occasionally, but a cough that happens with other symptoms or lasts a long time may be a sign of a condition that needs treatment. ?Take over-the-counter and prescription medicines only as told by your health care provider. ?Always cover your mouth when you cough. ?Contact a health care provider if you have new symptoms or a cough that does not get better after 2-3 weeks or gets worse. ?This information is not intended to replace advice given to you by your health care provider. Make sure you discuss any questions you have with your health care provider. ?Document Revised: 12/18/2018 Document Reviewed: 12/18/2018 ?Elsevier Patient Education ? [place]. ? ?

## 2022-04-20 NOTE — Progress Notes (Signed)
? ?Subjective:  ? ?By signing my name below, I, Zite Okoli, attest that this documentation has been prepared under the direction and in the presence of  Mosie Lukes, MD. 04/20/2022  ? ? ? Patient ID: Jackie Stevens, female    DOB: [birth year], 76 y.o.   MRN: [id number] ? ?Chief Complaint  ?Patient presents with  ? Home Health  ? ? ?HPI ?Patient is in today for an office visit. She is accompanied by her son. ? ?Home health orders were sent in the patient and she is here for an evaluation. Her son reports they have not received any help since July.  ? ?She is still bathing and dressing herself. Her son is cooking all the meals. He has cut back his hours at work to provide more care. He is trying to get more help.  ? ?She reports her dementia is getting worse. She denies urinary and bowel problems at this time. She still has an appetite and is eating properly. She would like a referral to neurology.  ? ?She complains of a cough that starts at random times. It happens a few times a day but doesn't wake her up. Normally resolves when she drinks some fluid.  It has been going for a couple weeks and is not accompanied by any heartburn. She often feels short of breath while coughing. Denies chest pain or sinus pain. Covid-19 test was negative. She uses azelastine and Flonase inhalers. ? ?Past Medical History:  ?Diagnosis Date  ? Abnormal cervical cytology 10/25/2012  ? Follows with Dr Leavy Cella of Gyn  ? Allergic rhinitis 11/04/2010  ? Qualifier: Diagnosis of  By: Annamaria Boots MD, Tarri Fuller D   ? Anemia 10/06/2013  ? Anginal pain   ? pt has history of CP states had cardiac workup with no specific issues identified   ? Arthritis   ? knees; right thumb; shoulders  ? 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 X-ray of the left shoulder on 09/14/2016: Marked degenerative change with significant osteophytic spurring and subchondral sclerosis.  Loss of glenohumeral joint space.  Well-maintained subacromial space.  Injected  09/30/2016 [name] .  ? Asthma, mild intermittent 04/02/2013  ? Chicken pox as a child  ? Colonic diverticular abscess 11/21/2012  ? Constipation   ? Decreased hearing   ? Dermatitis 03/06/2017  ? Diverticulitis 10/25/2012  ? pt. reports that a drain was placed - 09/2012    ? Diverticulitis of rectosigmoid 11/28/2012  ? Dry mouth   ? Essential hypertension 11/08/2010  ? Qualifier: Diagnosis of  By: Annamaria Boots MD, Tarri Fuller D   ? Excessive thirst   ? External hemorrhoid, bleeding   ? "sometimes" (21-Dec-2012)  ? Frequent urination   ? GERD (gastroesophageal reflux disease)   ? H/O hiatal hernia   ? Hand tingling   ? Heart murmur   ? History of kidney stones   ? Hyperglycemia 01/17/2020  ? Hyperlipidemia   ? Hypothyroidism   ? Incontinence   ? Insomnia   ? Kidney stones 1970's  ? "passed on their own" (2012-12-21)  ? Low back pain 06/09/2014  ? Measles as a child  ? 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 Obesity 09/11/2017  ? Obstructive sleep apnea 11/04/2010  ? NPSG [facility] 07/15/10- AHI 13.5/ hr CPAP 10/APS    ? Otitis media 01/07/2014  ? Palpitations   ? Paresthesia 03/23/2015  ? Left face  ? PONV (postoperative nausea and vomiting)   ? Rheumatoid arteritis   ?  Shortness of breath dyspnea   ? using stairs  ? Sinus pain   ? Swelling of both lower extremities   ? Thyroid cancer 1980's  ? Tinnitus   ? UTI (urinary tract infection) 04/02/2013  ? Vertigo 08/05/2019  ? Vitamin D deficiency 02/28/2018  ? ? ?Past Surgical History:  ?Procedure Laterality Date  ? CHOLECYSTECTOMY  1990  ? COLON SURGERY    ? COLOSTOMY REVISION  12/19/2012  ? Procedure: COLON RESECTION SIGMOID;  Surgeon: Gwenyth Ober, MD;  Location: [facility];  Service: General;  Laterality: N/A;  ? CYSTOSCOPY WITH STENT PLACEMENT  12/19/2012  ? Procedure: CYSTOSCOPY WITH STENT PLACEMENT;  Surgeon: Hanley Ben, MD;  Location: [facility];  Service: Urology;  Laterality: N/A;  ? DILATION AND CURETTAGE OF UTERUS  1960's  ? "lots of them; had miscarriages" (12/19/2012)  ? LYSIS OF ADHESION N/A  12/02/2015  ? Procedure: LAPAROSCOPIC LYSIS OF ADHESION;  Surgeon: Johnathan Hausen, MD;  Location: [facility];  Service: General;  Laterality: N/A;  ? ROBOTIC ASSISTED BILATERAL SALPINGO OOPHERECTOMY Bilateral 12/02/2015  ? Procedure: XI ROBOTIC ASSISTED BILATERAL SALPINGO OOPHORECTOMY;  Surgeon: Everitt Amber, MD;  Location: [facility];  Service: Gynecology;  Laterality: Bilateral;  ? SIGMOID RESECTION / RECTOPEXY  12/19/2012  ? THYROIDECTOMY, PARTIAL  1988  ? "then did iodine to remove the rest" (12/19/2012)  ? TONSILLECTOMY  1951?  ? TOTAL KNEE ARTHROPLASTY Left 01/19/2021  ? Procedure: TOTAL KNEE ARTHROPLASTY;  Surgeon: Gaynelle Arabian, MD;  Location: [facility];  Service: Orthopedics;  Laterality: Left;  64mn  ? TRANSRECTAL DRAINAGE OF PELVIC ABSCESS  10/27/2012  ? VAGINAL HYSTERECTOMY  1970's  ? "still have my ovaries" (12/19/2012)  ? ? ?Family History  ?Problem Relation Age of Onset  ? Heart disease Father   ? Pneumonia Father   ? Hypertension Father   ? Hyperlipidemia Father   ? Cancer Father   ?     skin  ? Stroke Father   ? Alzheimer's disease Mother   ? Heart disease Mother   ? Depression Mother   ? Emphysema Brother   ?     marijuana and cigarettes  ? Alcohol abuse Brother   ? Hearing loss Brother   ? Diabetes Maternal Grandmother   ? Alzheimer's disease Paternal Grandmother   ? Cancer Paternal Grandmother   ?     lung?- smoker  ? Hyperlipidemia Paternal Grandmother   ? Heart attack Paternal Grandfather   ? Alcohol abuse Paternal Grandfather   ? Neurofibromatosis Son   ?     schwanomatosis  ? Neurofibromatosis Son   ?     swanomatosis  ? Cancer Paternal Aunt   ? ? ?Social History  ? ?Socioeconomic History  ? Marital status: Married  ?  Spouse name: WPatrick Jupiter ? Number of children: 2  ? Years of education: 129 ? Highest education level: High school graduate  ?Occupational History  ? Occupation: Retired  ?Tobacco Use  ? Smoking status: Never  ? Smokeless tobacco: Never  ?Vaping Use  ? Vaping Use: Never used  ?Substance and Sexual  Activity  ? Alcohol use: No  ?  Alcohol/week: 0.0 standard drinks  ? Drug use: No  ? Sexual activity: Not Currently  ?Other Topics Concern  ? Not on file  ?Social History Narrative  ? Married   ? Children  ? Right handed  ? Some college  ? ?Social Determinants of Health  ? ?Financial Resource Strain: Not on file  ?Food Insecurity:  Not on file  ?Transportation Needs: Not on file  ?Physical Activity: Not on file  ?Stress: Not on file  ?Social Connections: Not on file  ?Intimate Partner Violence: Not on file  ? ? ?Outpatient Medications Prior to Visit  ?Medication Sig Dispense Refill  ? amLODipine (NORVASC) 2.5 MG tablet Take 1 tablet (2.5 mg total) by mouth daily. 30 tablet 2  ? Azelastine HCl 137 MCG/SPRAY SOLN PLACE 2 SPRAYS INTO BOTH NOSTRILS 2 (TWO) TIMES DAILY. 90 mL 4  ? Black Pepper-Turmeric (TURMERIC CURCUMIN) 04-999 MG CAPS 1 tablet daily.    ? carboxymethylcellulose (REFRESH PLUS) 0.5 % SOLN Place 1 drop into both eyes 3 (three) times daily as needed (dry eyes).    ? Cholecalciferol-Vitamin C (VITAMIN D3-VITAMIN C) 1000-500 UNIT-MG CAPS daily.    ? docusate sodium (COLACE) 100 MG capsule Take 100 mg by mouth daily.    ? famotidine (PEPCID) 20 MG tablet TAKE 2 TABLETS BY MOUTH EVERY DAY 180 tablet 1  ? FLUoxetine (PROZAC) 10 MG tablet TAKE 1 TABLET BY MOUTH EVERY DAY 30 tablet 3  ? fluticasone (FLONASE) 50 MCG/ACT nasal spray PRN (seasonal)    ? gabapentin (NEURONTIN) 300 MG capsule Take 1 capsule (300 mg total) by mouth at bedtime. 30 capsule 1  ? hydrochlorothiazide (HYDRODIURIL) 25 MG tablet TAKE 1 TABLET (25 MG TOTAL) BY MOUTH DAILY. 90 tablet 1  ? losartan (COZAAR) 100 MG tablet TAKE 1 TABLET BY MOUTH EVERYDAY AT BEDTIME 90 tablet 1  ? metoprolol tartrate (LOPRESSOR) 100 MG tablet TAKE 1&1/2 TABLETS BY MOUTH TWICE A DAY 270 tablet 1  ? naproxen (NAPROSYN) 375 MG tablet TAKE 1 TABLET (375 MG TOTAL) BY MOUTH 2 (TWO) TIMES DAILY AS NEEDED FOR MODERATE PAIN OR HEADACHE. 60 tablet 3  ? nitroGLYCERIN  (NITROSTAT) 0.4 MG SL tablet PLACE 1 TABLET UNDER THE TONGUE EVERY 5 MINUTES AS NEEDED FOR CHEST PAIN 25 tablet 1  ? omeprazole (PRILOSEC) 40 MG capsule TAKE 1 CAPSULE BY MOUTH TWICE A DAY 180 capsule 1  ? polyethylene gl

## 2022-04-21 DIAGNOSIS — R2681 Unsteadiness on feet: Secondary | ICD-10-CM | POA: Insufficient documentation

## 2022-04-21 MED ORDER — DONEPEZIL HCL 5 MG PO TABS: 5.0000 mg | ORAL_TABLET | Freq: Every day | ORAL | 3 refills | Status: DC

## 2022-04-21 NOTE — Assessment & Plan Note (Signed)
Supplement and monitor 

## 2022-04-21 NOTE — Assessment & Plan Note (Signed)
Worsening, she lives with her son who still has to work, have set up a CCM referral to see what services she is eligible for. Set up with home health evaluation for SN, PT, OT and home health aide. Referred to neurology and started on Aricept 5 mg daily ?

## 2022-04-21 NOTE — Assessment & Plan Note (Signed)
Patient set up for home health evaluation for PT and OT ?

## 2022-04-21 NOTE — Assessment & Plan Note (Signed)
Well controlled, no changes to meds. Encouraged heart healthy diet such as the DASH diet and exercise as tolerated.  °

## 2022-04-21 NOTE — Assessment & Plan Note (Signed)
hgba1c acceptable, minimize simple carbs. Increase exercise as tolerated.  

## 2022-04-21 NOTE — Assessment & Plan Note (Signed)
Worsening over several weeks. Will treat with Amoxicillin and if no improvement consider further work up.  ?

## 2022-04-26 ENCOUNTER — Telehealth: Payer: Self-pay | Admitting: *Deleted

## 2022-04-26 ENCOUNTER — Ambulatory Visit (HOSPITAL_BASED_OUTPATIENT_CLINIC_OR_DEPARTMENT_OTHER): Payer: Medicare HMO

## 2022-04-26 NOTE — Chronic Care Management (AMB) (Signed)
?  Chronic Care Management  ? ?Outreach Note ? ?04/26/2022 ?Name: Jackie Stevens MRN: [id number] DOB: 1946/11/19 ? ?Jackie Stevens is a 76 y.o. year old female who is a primary care patient of Mosie Lukes, MD. I reached out to [name] by phone today in response to a referral sent by [name]'s primary care provider. ? ?An unsuccessful telephone outreach was attempted today. The patient was referred to the case management team for assistance with care management and care coordination.  ? ?Follow Up Plan: A HIPAA compliant phone message was left for the patient providing contact information and requesting a return call.  ? ?Charlese Gruetzmacher, CCMA ?Care Guide, Embedded Care Coordination ?[facility]  ?Direct Dial: [phone] ? ? ?

## 2022-04-26 NOTE — Chronic Care Management (AMB) (Signed)
?  Chronic Care Management  ? ?Note ? ?04/26/2022 ?Name: Jackie Stevens MRN: [id number] DOB: 17-Feb-1946 ? ?Jackie Stevens is a 76 y.o. year old female who is a primary care patient of Mosie Lukes, MD. I reached out to [name] by phone today in response to a referral sent by [name] PCP. ? ?[name] was given information about Chronic Care Management services today including:  ?CCM service includes personalized support from designated clinical staff supervised by her physician, including individualized plan of care and coordination with other care providers ?24/7 contact phone numbers for assistance for urgent and routine care needs. ?Service will only be billed when office clinical staff spend 20 minutes or more in a month to coordinate care. ?Only one practitioner may furnish and bill the service in a calendar month. ?The patient may stop CCM services at any time (effective at the end of the month) by phone call to the office staff. ?The patient is responsible for co-pay (up to 20% after annual deductible is met) if co-pay is required by the individual health plan.  ? ?Patient agreed to services and verbal consent obtained.  ? ?Follow up plan: ?Telephone appointment with care management team member scheduled for: 04/30/2022 and 05/05/2022 ? ?Deshea Pooley, CCMA ?Care Guide, Embedded Care Coordination ?[facility]  Care Management  ?Direct Dial: [phone] ? ? ?

## 2022-04-27 ENCOUNTER — Ambulatory Visit (HOSPITAL_BASED_OUTPATIENT_CLINIC_OR_DEPARTMENT_OTHER)
Admission: RE | Admit: 2022-04-27 | Discharge: 2022-04-27 | Disposition: A | Discharge status: Home / Self Care | Payer: Medicare HMO | Source: Ambulatory Visit | Attending: Family Medicine | Admitting: Family Medicine

## 2022-04-27 DIAGNOSIS — R059 Cough, unspecified: Secondary | ICD-10-CM | POA: Diagnosis not present

## 2022-04-27 DIAGNOSIS — M19041 Primary osteoarthritis, right hand: Secondary | ICD-10-CM | POA: Diagnosis not present

## 2022-04-27 DIAGNOSIS — E559 Vitamin D deficiency, unspecified: Secondary | ICD-10-CM | POA: Diagnosis not present

## 2022-04-27 DIAGNOSIS — J309 Allergic rhinitis, unspecified: Secondary | ICD-10-CM | POA: Diagnosis not present

## 2022-04-27 DIAGNOSIS — G3184 Mild cognitive impairment, so stated: Secondary | ICD-10-CM | POA: Diagnosis not present

## 2022-04-27 DIAGNOSIS — E038 Other specified hypothyroidism: Secondary | ICD-10-CM | POA: Diagnosis not present

## 2022-04-27 DIAGNOSIS — J4520 Mild intermittent asthma, uncomplicated: Secondary | ICD-10-CM | POA: Diagnosis not present

## 2022-04-27 DIAGNOSIS — H6690 Otitis media, unspecified, unspecified ear: Secondary | ICD-10-CM | POA: Diagnosis not present

## 2022-04-27 DIAGNOSIS — G4733 Obstructive sleep apnea (adult) (pediatric): Secondary | ICD-10-CM | POA: Diagnosis not present

## 2022-04-27 DIAGNOSIS — M19012 Primary osteoarthritis, left shoulder: Secondary | ICD-10-CM | POA: Diagnosis not present

## 2022-04-27 DIAGNOSIS — I10 Essential (primary) hypertension: Secondary | ICD-10-CM | POA: Diagnosis not present

## 2022-04-27 DIAGNOSIS — H60399 Other infective otitis externa, unspecified ear: Secondary | ICD-10-CM | POA: Diagnosis not present

## 2022-04-27 DIAGNOSIS — E782 Mixed hyperlipidemia: Secondary | ICD-10-CM | POA: Diagnosis not present

## 2022-04-27 DIAGNOSIS — K5900 Constipation, unspecified: Secondary | ICD-10-CM | POA: Diagnosis not present

## 2022-04-27 DIAGNOSIS — M069 Rheumatoid arthritis, unspecified: Secondary | ICD-10-CM | POA: Diagnosis not present

## 2022-04-27 DIAGNOSIS — H9319 Tinnitus, unspecified ear: Secondary | ICD-10-CM | POA: Diagnosis not present

## 2022-04-27 DIAGNOSIS — K439 Ventral hernia without obstruction or gangrene: Secondary | ICD-10-CM | POA: Diagnosis not present

## 2022-04-27 DIAGNOSIS — E039 Hypothyroidism, unspecified: Secondary | ICD-10-CM | POA: Diagnosis not present

## 2022-04-27 DIAGNOSIS — E538 Deficiency of other specified B group vitamins: Secondary | ICD-10-CM | POA: Diagnosis not present

## 2022-04-27 DIAGNOSIS — R69 Illness, unspecified: Secondary | ICD-10-CM | POA: Diagnosis not present

## 2022-04-27 DIAGNOSIS — K648 Other hemorrhoids: Secondary | ICD-10-CM | POA: Diagnosis not present

## 2022-04-27 DIAGNOSIS — K219 Gastro-esophageal reflux disease without esophagitis: Secondary | ICD-10-CM | POA: Diagnosis not present

## 2022-04-27 DIAGNOSIS — G4700 Insomnia, unspecified: Secondary | ICD-10-CM | POA: Diagnosis not present

## 2022-04-27 DIAGNOSIS — D649 Anemia, unspecified: Secondary | ICD-10-CM | POA: Diagnosis not present

## 2022-04-28 DIAGNOSIS — M19012 Primary osteoarthritis, left shoulder: Secondary | ICD-10-CM | POA: Diagnosis not present

## 2022-04-28 DIAGNOSIS — E559 Vitamin D deficiency, unspecified: Secondary | ICD-10-CM | POA: Diagnosis not present

## 2022-04-28 DIAGNOSIS — I10 Essential (primary) hypertension: Secondary | ICD-10-CM | POA: Diagnosis not present

## 2022-04-28 DIAGNOSIS — E782 Mixed hyperlipidemia: Secondary | ICD-10-CM | POA: Diagnosis not present

## 2022-04-28 DIAGNOSIS — J4520 Mild intermittent asthma, uncomplicated: Secondary | ICD-10-CM | POA: Diagnosis not present

## 2022-04-28 DIAGNOSIS — E538 Deficiency of other specified B group vitamins: Secondary | ICD-10-CM | POA: Diagnosis not present

## 2022-04-28 DIAGNOSIS — K5900 Constipation, unspecified: Secondary | ICD-10-CM | POA: Diagnosis not present

## 2022-04-28 DIAGNOSIS — K648 Other hemorrhoids: Secondary | ICD-10-CM | POA: Diagnosis not present

## 2022-04-28 DIAGNOSIS — G4733 Obstructive sleep apnea (adult) (pediatric): Secondary | ICD-10-CM | POA: Diagnosis not present

## 2022-04-28 DIAGNOSIS — G4700 Insomnia, unspecified: Secondary | ICD-10-CM | POA: Diagnosis not present

## 2022-04-28 DIAGNOSIS — H60399 Other infective otitis externa, unspecified ear: Secondary | ICD-10-CM | POA: Diagnosis not present

## 2022-04-28 DIAGNOSIS — M069 Rheumatoid arthritis, unspecified: Secondary | ICD-10-CM | POA: Diagnosis not present

## 2022-04-28 DIAGNOSIS — K439 Ventral hernia without obstruction or gangrene: Secondary | ICD-10-CM | POA: Diagnosis not present

## 2022-04-28 DIAGNOSIS — D649 Anemia, unspecified: Secondary | ICD-10-CM | POA: Diagnosis not present

## 2022-04-28 DIAGNOSIS — R69 Illness, unspecified: Secondary | ICD-10-CM | POA: Diagnosis not present

## 2022-04-28 DIAGNOSIS — K219 Gastro-esophageal reflux disease without esophagitis: Secondary | ICD-10-CM | POA: Diagnosis not present

## 2022-04-28 DIAGNOSIS — H6690 Otitis media, unspecified, unspecified ear: Secondary | ICD-10-CM | POA: Diagnosis not present

## 2022-04-28 DIAGNOSIS — G3184 Mild cognitive impairment, so stated: Secondary | ICD-10-CM | POA: Diagnosis not present

## 2022-04-28 DIAGNOSIS — M19041 Primary osteoarthritis, right hand: Secondary | ICD-10-CM | POA: Diagnosis not present

## 2022-04-28 DIAGNOSIS — H9319 Tinnitus, unspecified ear: Secondary | ICD-10-CM | POA: Diagnosis not present

## 2022-04-28 DIAGNOSIS — J309 Allergic rhinitis, unspecified: Secondary | ICD-10-CM | POA: Diagnosis not present

## 2022-04-30 ENCOUNTER — Ambulatory Visit (INDEPENDENT_AMBULATORY_CARE_PROVIDER_SITE_OTHER): Payer: Medicare HMO

## 2022-04-30 DIAGNOSIS — K439 Ventral hernia without obstruction or gangrene: Secondary | ICD-10-CM | POA: Diagnosis not present

## 2022-04-30 DIAGNOSIS — M19012 Primary osteoarthritis, left shoulder: Secondary | ICD-10-CM | POA: Diagnosis not present

## 2022-04-30 DIAGNOSIS — G4733 Obstructive sleep apnea (adult) (pediatric): Secondary | ICD-10-CM | POA: Diagnosis not present

## 2022-04-30 DIAGNOSIS — K648 Other hemorrhoids: Secondary | ICD-10-CM | POA: Diagnosis not present

## 2022-04-30 DIAGNOSIS — I10 Essential (primary) hypertension: Secondary | ICD-10-CM | POA: Diagnosis not present

## 2022-04-30 DIAGNOSIS — H6690 Otitis media, unspecified, unspecified ear: Secondary | ICD-10-CM | POA: Diagnosis not present

## 2022-04-30 DIAGNOSIS — M19041 Primary osteoarthritis, right hand: Secondary | ICD-10-CM | POA: Diagnosis not present

## 2022-04-30 DIAGNOSIS — J4520 Mild intermittent asthma, uncomplicated: Secondary | ICD-10-CM | POA: Diagnosis not present

## 2022-04-30 DIAGNOSIS — G4700 Insomnia, unspecified: Secondary | ICD-10-CM | POA: Diagnosis not present

## 2022-04-30 DIAGNOSIS — D649 Anemia, unspecified: Secondary | ICD-10-CM | POA: Diagnosis not present

## 2022-04-30 DIAGNOSIS — F0394 Unspecified dementia, unspecified severity, with anxiety: Secondary | ICD-10-CM

## 2022-04-30 DIAGNOSIS — E559 Vitamin D deficiency, unspecified: Secondary | ICD-10-CM | POA: Diagnosis not present

## 2022-04-30 DIAGNOSIS — H9319 Tinnitus, unspecified ear: Secondary | ICD-10-CM | POA: Diagnosis not present

## 2022-04-30 DIAGNOSIS — J309 Allergic rhinitis, unspecified: Secondary | ICD-10-CM | POA: Diagnosis not present

## 2022-04-30 DIAGNOSIS — E538 Deficiency of other specified B group vitamins: Secondary | ICD-10-CM | POA: Diagnosis not present

## 2022-04-30 DIAGNOSIS — K5900 Constipation, unspecified: Secondary | ICD-10-CM | POA: Diagnosis not present

## 2022-04-30 DIAGNOSIS — K219 Gastro-esophageal reflux disease without esophagitis: Secondary | ICD-10-CM | POA: Diagnosis not present

## 2022-04-30 DIAGNOSIS — E782 Mixed hyperlipidemia: Secondary | ICD-10-CM | POA: Diagnosis not present

## 2022-04-30 DIAGNOSIS — R69 Illness, unspecified: Secondary | ICD-10-CM | POA: Diagnosis not present

## 2022-04-30 DIAGNOSIS — H60399 Other infective otitis externa, unspecified ear: Secondary | ICD-10-CM | POA: Diagnosis not present

## 2022-04-30 DIAGNOSIS — M069 Rheumatoid arthritis, unspecified: Secondary | ICD-10-CM | POA: Diagnosis not present

## 2022-04-30 DIAGNOSIS — G3184 Mild cognitive impairment, so stated: Secondary | ICD-10-CM | POA: Diagnosis not present

## 2022-04-30 NOTE — Patient Instructions (Signed)
Visit Information  Thank you for taking time to visit with me today. Please don't hesitate to contact me if I can be of assistance to you before our next scheduled telephone appointment.  Following are the goals we discussed today:  Patient Goals/Self-Care Activities: Take medications as prescribed   Attend all scheduled provider appointments Plan to received a call from the Care Guide re community resources: Adult day programs; personal care service organizations, home modification resources Fall prevention strategies: Be cautious when walking. Remove any obstacles that may increase the risk of trips and falls. Walk always in a well-lit room (use night lights in the walls). Avoid area rugs or power cords from appliances in the middle of the walkways. Use a walker at all times. Participate with PT/OT as recommended Safe environment strategies: personal care assistance and supervision. Monitor the home for safety hazards: kitchen potential safety hazards- use appliances that have an automatic shut-off feature, apply stove knob covers, remove knobs or turn off gas when stove is not in use, disconnect the garbage disposal. Monitor your home for hazardous equipment/chemicals-Keep out of reach for example medications, alcohol, matches sharp objects, cleaning products, laundry pacs, bleach and other harmful chemicals. Contact Alzheimer's Association for more information [phone] Contact your nurse care management coordinator as needed for care management and/or care coordination needs  Our next appointment is by telephone on 05/31/22 at 9:00 am  Please call the care guide team at [phone] if you need to cancel or reschedule your appointment.   If you are experiencing a Mental Health or [place] or need someone to talk to, please call the Suicide and Crisis Lifeline: 988 call 1-800-273-TALK (toll free, 24 hour hotline)   Patient verbalizes understanding of instructions and care  plan provided today and agrees to view in [place]. Active MyChart status and patient understanding of how to access instructions and care plan via MyChart confirmed with patient.     Thea Silversmith, RN, MSN, BSN, CCM Care Management Coordinator [facility] [phone]   Dementia Caregiver Guide Dementia is a term used to describe a number of symptoms that affect memory and thinking. The most common symptoms include: Memory loss. Trouble with language and communication. Trouble concentrating. Poor judgment and problems with reasoning. Wandering from home or public places. Extreme anxiety or depression. Being suspicious or having angry outbursts and accusations. Child-like behavior and language. Dementia can be frightening and confusing. And taking care of someone with dementia can be challenging. This guide provides tips to help you when providing care for a person with dementia. How to help manage lifestyle changes Dementia usually gets worse slowly over time. In the early stages, people with dementia can stay independent and safe with some help. In later stages, they need help with daily tasks such as dressing, grooming, and using the bathroom. There are actions you can take to help a person manage his or her life while living with this condition. Communicating When the person is talking or seems frustrated, make eye contact and hold the person's hand. Ask specific questions that need yes or no answers. Use simple words, short sentences, and a calm voice. Only give one direction at a time. When offering choices, limit the person to just one or two. Avoid correcting the person in a negative way. If the person is struggling to find the right words, gently try to help him or her. Preventing injury  Keep floors clear of clutter. Remove rugs, magazine racks, and floor lamps. Keep  hallways well lit, especially at night. Put a handrail and nonslip mat in the bathtub or  shower. Put childproof locks on cabinets that contain dangerous items, such as medicines, alcohol, guns, toxic cleaning items, sharp tools or utensils, matches, and lighters. For doors to the outside of the house, put the locks in places where the person cannot see or reach them easily. This will help ensure that the person does not wander out of the house and get lost. Be prepared for emergencies. Keep a list of emergency phone numbers and addresses in a convenient area. Remove car keys and lock garage doors so that the person does not try to get in the car and drive. Have the person wear a bracelet that tracks locations and identifies the person as having memory problems. This should be worn at all times for safety. Helping with daily life  Keep the person on track with his or her routine. Try to identify areas where the person may need help. Be supportive, patient, calm, and encouraging. Gently remind the person that adjusting to changes takes time. Help with the tasks that the person has asked for help with. Keep the person involved in daily tasks and decisions as much as possible. Encourage conversation, but try not to get frustrated if the person struggles to find words or does not seem to appreciate your help. How to recognize stress Look for signs of stress in yourself and in the person you are caring for. If you notice signs of stress, take steps to manage it. Symptoms of stress include: Feeling anxious, irritable, frustrated, or angry. Denying that the person has dementia or that his or her symptoms will not improve. Feeling depressed, hopeless, or unappreciated. Difficulty sleeping. Difficulty concentrating. Developing stress-related health problems. Feeling like you have too little time for your own life. Follow these instructions at home: Take care of your health Make sure that you and the person you are caring for: Get regular sleep. Exercise regularly. Eat regular,  nutritious meals. Take over-the-counter and prescription medicines only as told by your health care providers. Drink enough fluid to keep your urine pale yellow. Attend all scheduled health care appointments.  General instructions Join a support group with others who are caregivers. Ask about respite care resources. Respite care can provide short-term care for the person so that you can have a regular break from the stress of caregiving. Consider any safety risks and take steps to avoid them. Organize medicines in a pill box for each day of the week. Create a plan to handle any legal or financial matters. Get legal or financial advice if needed. Keep a calendar in a central location to remind the person of appointments or other activities. Where to find support: Many individuals and organizations offer support. These include: Support groups for people with dementia. Support groups for caregivers. Counselors or therapists. Home health care services. Adult day care centers. Where to find more information Centers for Disease Control and Prevention: http://www.wolf.info/ Alzheimer's Association: [place].co.nz Family Caregiver Alliance: www.caregiver.[place]: www.alzfdn.org Contact a health care provider if: The person's health is rapidly getting worse. You are no longer able to care for the person. Caring for the person is affecting your physical and emotional health. You are feeling depressed or anxious about caring for the person. Get help right away if: The person threatens himself or herself, you, or anyone else. You feel depressed or sad, or feel that you want to harm yourself. If you ever feel  like your loved one may hurt himself or herself or others, or if he or she shares thoughts about taking his or her own life, get help right away. You can go to your nearest emergency department or: Call your local emergency services (911 in the [place] Call a suicide crisis  helpline, such as the [place] at 306-698-3786 or 988 in the [place]. This is open 24 hours a day in the [place] Text the Crisis Text Line at 4375902450 (in the [place].). Summary Dementia is a term used to describe a number of symptoms that affect memory and thinking. Dementia usually gets worse slowly over time. Take steps to reduce the person's risk of injury and to plan for future care. Caregivers need support, relief from caregiving, and time for their own lives. This information is not intended to replace advice given to you by your health care provider. Make sure you discuss any questions you have with your health care provider. Document Revised: 06/24/2021 Document Reviewed: 04/14/2020 Elsevier Patient Education  [place].

## 2022-04-30 NOTE — Chronic Care Management (AMB) (Signed)
Chronic Care Management   CCM RN Visit Note  04/30/2022 Name: Jackie Stevens MRN: [id number] DOB: [birth year]  Subjective: Jackie Stevens is a 76 y.o. year old female who is a primary care patient of Mosie Lukes, MD. The care management team was consulted for assistance with disease management and care coordination needs.    Engaged with patient by telephone for initial visit in response to provider referral for case management and/or care coordination services.   Consent to Services:  The patient was given the following information about Chronic Care Management services today, agreed to services, and gave verbal consent: 1. CCM service includes personalized support from designated clinical staff supervised by the primary care provider, including individualized plan of care and coordination with other care providers 2. 24/7 contact phone numbers for assistance for urgent and routine care needs. 3. Service will only be billed when office clinical staff spend 20 minutes or more in a month to coordinate care. 4. Only one practitioner may furnish and bill the service in a calendar month. 5.The patient may stop CCM services at any time (effective at the end of the month) by phone call to the office staff. 6. The patient will be responsible for cost sharing (co-pay) of up to 20% of the service fee (after annual deductible is met). Patient agreed to services and consent obtained.  Patient agreed to services and verbal consent obtained.   Assessment: Review of patient past medical history, allergies, medications, health status, including review of consultants reports, laboratory and other test data, was performed as part of comprehensive evaluation and provision of chronic care management services.   SDOH (Social Determinants of Health) assessments and interventions performed:  SDOH Interventions    Flowsheet Row Most Recent Value  SDOH Interventions   Food Insecurity Interventions Intervention Not  Indicated  Transportation Interventions Intervention Not Indicated        CCM Care Plan  Allergies  Allergen Reactions   Neomycin-Bacitracin Zn-Polymyx Rash    Polysporin- is tolerated    Niacin Other (See Comments) and Cough    "cough til I threw up" (12/19/2012)   Ciprofloxacin Hives    Got cipro and flagyl at same time, localized hives to IV arm   Flagyl [Metronidazole] Hives    Got cipro and flagyl at same time, localized hives to IV arm    Outpatient Encounter Medications as of 04/30/2022  Medication Sig Note   amLODipine (NORVASC) 2.5 MG tablet Take 1 tablet (2.5 mg total) by mouth daily.    Azelastine HCl 137 MCG/SPRAY SOLN PLACE 2 SPRAYS INTO BOTH NOSTRILS 2 (TWO) TIMES DAILY.    benzonatate (TESSALON PERLES) 100 MG capsule Take 1-2 capsules (100-200 mg total) by mouth 3 (three) times daily as needed for cough.    Black Pepper-Turmeric (TURMERIC CURCUMIN) 04-999 MG CAPS 1 tablet daily.    carboxymethylcellulose (REFRESH PLUS) 0.5 % SOLN Place 1 drop into both eyes 3 (three) times daily as needed (dry eyes). 01/01/2021: [employer] Brand    Cholecalciferol-Vitamin C (VITAMIN D3-VITAMIN C) 1000-500 UNIT-MG CAPS daily.    docusate sodium (COLACE) 100 MG capsule Take 100 mg by mouth daily.    donepezil (ARICEPT) 5 MG tablet Take 1 tablet (5 mg total) by mouth at bedtime.    famotidine (PEPCID) 20 MG tablet TAKE 2 TABLETS BY MOUTH EVERY DAY    FLUoxetine (PROZAC) 10 MG tablet TAKE 1 TABLET BY MOUTH EVERY DAY    fluticasone (FLONASE) 50 MCG/ACT nasal spray PRN (seasonal)  gabapentin (NEURONTIN) 300 MG capsule Take 1 capsule (300 mg total) by mouth at bedtime.    hydrochlorothiazide (HYDRODIURIL) 25 MG tablet TAKE 1 TABLET (25 MG TOTAL) BY MOUTH DAILY.    levothyroxine (SYNTHROID) 112 MCG tablet Take 1 tablet (112 mcg total) by mouth daily before breakfast.    losartan (COZAAR) 100 MG tablet TAKE 1 TABLET BY MOUTH EVERYDAY AT BEDTIME    metoprolol tartrate (LOPRESSOR) 100 MG tablet  TAKE 1&1/2 TABLETS BY MOUTH TWICE A DAY    naproxen (NAPROSYN) 375 MG tablet TAKE 1 TABLET (375 MG TOTAL) BY MOUTH 2 (TWO) TIMES DAILY AS NEEDED FOR MODERATE PAIN OR HEADACHE.    nitroGLYCERIN (NITROSTAT) 0.4 MG SL tablet PLACE 1 TABLET UNDER THE TONGUE EVERY 5 MINUTES AS NEEDED FOR CHEST PAIN 01/19/2021: Not needed.    omeprazole (PRILOSEC) 40 MG capsule TAKE 1 CAPSULE BY MOUTH TWICE A DAY    polyethylene glycol (MIRALAX / GLYCOLAX) 17 g packet Take 17 g by mouth daily.    Psyllium-Calcium (METAMUCIL PLUS CALCIUM) CAPS daily.    rosuvastatin (CRESTOR) 40 MG tablet TAKE 1 TABLET BY MOUTH EVERY DAY    temazepam (RESTORIL) 7.5 MG capsule 1 po every other day for 1 week, then every third day for 1 week. 04/30/2022: Reports takes every third day   No facility-administered encounter medications on file as of 04/30/2022.    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[place]) 02/05/2020   Hyperglycemia 01/17/2020   Vertigo 08/05/2019   Palpitations 05/28/2019   Other fatigue 02/28/2018   Shortness of breath on exertion 02/28/2018   Vitamin D deficiency 02/28/2018   Obesity 09/11/2017   Dermatitis 03/06/2017   Arthritis of left shoulder region 09/30/2016   Pain of joint of left ankle and foot 09/15/2016   Left shoulder pain 09/14/2016   Preventative health care 02/26/2016   Radicular pain of sacrum 02/26/2016   Vitamin B12 deficiency 02/26/2016   Ventral hernia 12/02/2015   Pelvic mass in female 12/02/2015   Colitis 09/14/2015   Paresthesia 03/23/2015   Otitis, externa, infective 03/13/2015   Hyponatremia 03/13/2015   Cystitis 09/10/2014   Headache 08/21/2014   Low back pain 06/09/2014   Otitis media 01/07/2014   Anemia 10/06/2013   Medicare annual wellness visit, subsequent 10/06/2013   Asthma, mild  intermittent 04/02/2013   Internal hemorrhoid, bleeding 01/23/2013   Diverticulitis of rectosigmoid 11/28/2012   Colonic diverticular abscess 11/21/2012   Abnormal cervical cytology 10/25/2012   Cancer ([place])    Insomnia    Hypothyroidism    Constipation    Cough 09/02/2012   Chest pain 08/05/2011   Hyperlipidemia, mixed 11/08/2010   Essential hypertension 11/08/2010   GERD (gastroesophageal reflux disease) 11/08/2010   Obstructive sleep apnea 11/04/2010   Allergic rhinitis 11/04/2010    Conditions to be addressed/monitored:HTN and Dementia  Care Plan : RN Care Manager Plan of Care  Updates made by Luretha Rued, RN since 04/30/2022 12:00 AM     Problem: No Plan of Care Established for Management of Chronic Disease and/or  Care Coordination needs   Priority: High     Long-Range Goal: Development of Plan of Care for Chronic Disease Management and/or Care Coordination needs   Start Date: 04/30/2022  Expected End Date: 10/31/2022  Priority: High  Note:  Current Barriers: Initial assessment: RNCM spoke with patient who confirmed DOB, she was unable to state address without asking her son. Mrs. Cavey gave permission for [facility] to speak with her son/caregiver, Everlene Other. Mr. Gao reports he lives in the home with mother and is her primary caregiver. He manages her medications. He reports she is able to bath and dress self, but needs grab bar in the shower. He would like assistance with finding personal care service for assistance around the home. In addition, he would like to see her become more active. He reports she becomes anxious when he is not at home. He also expresses she seems to not like being in the dark. She is ambulatory with a walker. Mrs. Cadenas fell a the end of last year causing her to walk with a limp. He reports [place] started services last week. Care Coordination needs related to Limited social support, Memory Deficits, and Lacks knowledge of community  resource: Adult day programs; personal care service organizations, home modification resources  Chronic Disease Management support and education needs related to HTN and Dementia  RNCM Clinical Goal(s):  Patient will verbalize understanding of plan for management of HTN and Dementia as evidenced by patient/caregiver report attend all scheduled medical appointments:   as evidenced by chart review and/or patient/caregiver report        work with community resource care guide to address needs related to Limited social support and Memory Deficits as evidenced by patient and/or community resource care guide support    through collaboration with Consulting civil engineer, provider, and care team.   Interventions: 1:1 collaboration with primary care provider regarding development and update of comprehensive plan of care as evidenced by provider attestation and co-signature Inter-disciplinary care team collaboration (see longitudinal plan of care) Evaluation of current treatment plan related to  self management and patient's adherence to plan as established by provider  Hypertension Interventions:  (Status:  New goal.) Long Term Goal Last practice recorded BP readings:  BP Readings from Last 3 Encounters:  04/21/22 132/76  01/18/22 (!) 158/82  01/07/22 (!) 202/82  Most recent eGFR/CrCl: No results found for: EGFR  No components found for: CRCL  Evaluation of current treatment plan related to hypertension self management and patient's adherence to plan as established by provider Provided education to patient re: stroke prevention, s/s of heart attack and stroke Reviewed medications with patient and discussed importance of compliance  Dementia:  (Status:  New goal.)  Long Term Goal Evaluation of current treatment plan: Dementia with anxiety Collaboration with LCSW- scheduled for telephone assessment on 05/13/22 Encouraged caregiver to continue to make sure he has support he needs and is taking care of self  also Discussed in home assistance Personal care giver-discussed [facility], [facility] solution for Adult day care Program and [facility] program Referral placed to care guide for community resources, Adult day programs, Personal care service list, needs grab bars in shower Medications reviewed, encouraged to take as prescribed Upcoming appointment reviewed: Neurology 05/03/22 Advised to contact provider for new or worsening symptoms   Patient Goals/Self-Care Activities: Take medications as prescribed   Attend all scheduled provider appointments Plan to received a call from the Care Guide re community resources: Adult day programs; personal care service organizations, home modification resources Fall prevention strategies: Be cautious when walking. Remove any obstacles that may increase the risk of trips and falls. Walk always in a well-lit room (use night lights in the walls). Avoid area rugs or power  cords from appliances in the middle of the walkways. Use a walker at all times. Participate with PT/OT as recommended Safe environment strategies: personal care assistance and supervision. Monitor the home for safety hazards: kitchen potential safety hazards- use appliances that have an automatic shut-off feature, apply stove knob covers, remove knobs or turn off gas when stove is not in use, disconnect the garbage disposal. Monitor your home for hazardous equipment/chemicals-Keep out of reach for example medications, alcohol, matches sharp objects, cleaning products, laundry pacs, bleach and other harmful chemicals. Contact Alzheimer's Association for more information [phone] Contact your nurse care management coordinator as needed for care management and/or care coordination needs    Plan:Telephone follow up appointment with care management team member scheduled for:  05/31/22 The patient has been provided with contact information for the care management team and has been advised to call  with any health related questions or concerns.   Thea Silversmith, RN, MSN, BSN, CCM Care Management Coordinator [facility] [phone]

## 2022-05-03 ENCOUNTER — Encounter: Payer: Self-pay | Admitting: Physician Assistant

## 2022-05-03 ENCOUNTER — Ambulatory Visit: Payer: Medicare HMO | Admitting: Physician Assistant

## 2022-05-03 VITALS — BP 183/87 | HR 106 | Resp 20 | Ht 66.0 in | Wt 175.0 lb

## 2022-05-03 DIAGNOSIS — F02818 Dementia in other diseases classified elsewhere, unspecified severity, with other behavioral disturbance: Secondary | ICD-10-CM

## 2022-05-03 DIAGNOSIS — R69 Illness, unspecified: Secondary | ICD-10-CM | POA: Diagnosis not present

## 2022-05-03 DIAGNOSIS — F0280 Dementia in other diseases classified elsewhere without behavioral disturbance: Secondary | ICD-10-CM

## 2022-05-03 DIAGNOSIS — G301 Alzheimer's disease with late onset: Secondary | ICD-10-CM | POA: Diagnosis not present

## 2022-05-03 DIAGNOSIS — G309 Alzheimer's disease, unspecified: Secondary | ICD-10-CM

## 2022-05-03 MED ORDER — DONEPEZIL HCL 10 MG PO TABS: ORAL_TABLET | ORAL | 11 refills | Status: DC

## 2022-05-03 NOTE — Patient Instructions (Signed)
It was a pleasure to see you today at our office.   Recommendations:  Follow up in  6 months Continue donepezil but increase it to 10 mg daily. Side effects were discussed     Whom to call:  Memory  decline, memory medications: Call out office [phone]   For psychiatric meds, mood meds: Please have your primary care physician manage these medications.   Counseling regarding caregiver distress, including caregiver depression, anxiety and issues regarding community resources, adult day care programs, adult living facilities, or memory care questions:   Feel free to contact [place], Social Worker at [phone]   For assessment of decision of mental capacity and competency:  Call Dr. Anthoney Harada, geriatric psychiatrist at [phone]  For guidance in geriatric dementia issues please call [facility] [phone]  For guidance regarding [facility] and if placement were needed at the facility, contact Arnell Asal, Social Worker tel: [phone]  If you have any severe symptoms of a stroke, or other severe issues such as confusion,severe chills or fever, etc call 911 or go to the ER as you may need to be evaluate further   Feel free to visit Facebook page " [email or web address]" for tips of how to care for people with memory problems.      RECOMMENDATIONS FOR ALL PATIENTS WITH MEMORY PROBLEMS: 1. Continue to exercise (Recommend 30 minutes of walking everyday, or 3 hours every week) 2. Increase social interactions - continue going to [place] and enjoy social gatherings with friends and family 3. Eat healthy, avoid fried foods and eat more fruits and vegetables 4. Maintain adequate blood pressure, blood sugar, and blood cholesterol level. Reducing the risk of stroke and cardiovascular disease also helps promoting better memory. 5. Avoid stressful situations. Live a simple life and avoid aggravations. Organize your time and prepare for the next day  in anticipation. 6. Sleep well, avoid any interruptions of sleep and avoid any distractions in the bedroom that may interfere with adequate sleep quality 7. Avoid sugar, avoid sweets as there is a strong link between excessive sugar intake, diabetes, and cognitive impairment We discussed the Mediterranean diet, which has been shown to help patients reduce the risk of progressive memory disorders and reduces cardiovascular risk. This includes eating fish, eat fruits and green leafy vegetables, nuts like almonds and hazelnuts, walnuts, and also use olive oil. Avoid fast foods and fried foods as much as possible. Avoid sweets and sugar as sugar use has been linked to worsening of memory function.  There is always a concern of gradual progression of memory problems. If this is the case, then we may need to adjust level of care according to patient needs. Support, both to the patient and caregiver, should then be put into place.    The [employer] is here all day, every day for people facing Alzheimer's disease through our free 24/7 Helpline: [phone]. The Helpline provides reliable information and support to all those who need assistance, such as individuals living with memory loss, Alzheimer's or other dementia, caregivers, health care professionals and the public.  Our highly trained and knowledgeable staff can help you with: Understanding memory loss, dementia and Alzheimer's  Medications and other treatment options  General information about aging and brain health  Skills to provide quality care and to find the best care from professionals  Legal, financial and living-arrangement decisions Our Helpline also features: Confidential care consultation provided by master's level clinicians who can help with decision-making support, crisis  assistance and education on issues families face every day  Help in a caller's preferred language using our translation service that features more than  200 languages and dialects  Referrals to local community programs, services and ongoing support     FALL PRECAUTIONS: Be cautious when walking. Scan the area for obstacles that may increase the risk of trips and falls. When getting up in the mornings, sit up at the edge of the bed for a few minutes before getting out of bed. Consider elevating the bed at the head end to avoid drop of blood pressure when getting up. Walk always in a well-lit room (use night lights in the walls). Avoid area rugs or power cords from appliances in the middle of the walkways. Use a walker or a cane if necessary and consider physical therapy for balance exercise. Get your eyesight checked regularly.  FINANCIAL OVERSIGHT: Supervision, especially oversight when making financial decisions or transactions is also recommended.  HOME SAFETY: Consider the safety of the kitchen when operating appliances like stoves, microwave oven, and blender. Consider having supervision and share cooking responsibilities until no longer able to participate in those. Accidents with firearms and other hazards in the house should be identified and addressed as well.   ABILITY TO BE LEFT ALONE: If patient is unable to contact 911 operator, consider using LifeLine, or when the need is there, arrange for someone to stay with patients. Smoking is a fire hazard, consider supervision or cessation. Risk of wandering should be assessed by caregiver and if detected at any point, supervision and safe proof recommendations should be instituted.  MEDICATION SUPERVISION: Inability to self-administer medication needs to be constantly addressed. Implement a mechanism to ensure safe administration of the medications.      Mediterranean Diet A Mediterranean diet refers to food and lifestyle choices that are based on the traditions of countries located on the [employer]. This way of eating has been shown to help prevent certain conditions and improve  outcomes for people who have chronic diseases, like kidney disease and heart disease. What are tips for following this plan? Lifestyle  Cook and eat meals together with your family, when possible. Drink enough fluid to keep your urine clear or pale yellow. Be physically active every day. This includes: Aerobic exercise like running or swimming. Leisure activities like gardening, walking, or housework. Get 7-8 hours of sleep each night. If recommended by your health care provider, drink red wine in moderation. This means 1 glass a day for nonpregnant women and 2 glasses a day for men. A glass of wine equals 5 oz (150 mL). Reading food labels  Check the serving size of packaged foods. For foods such as rice and pasta, the serving size refers to the amount of cooked product, not dry. Check the total fat in packaged foods. Avoid foods that have saturated fat or trans fats. Check the ingredients list for added sugars, such as corn syrup. Shopping  At the grocery store, buy most of your food from the areas near the walls of the store. This includes: Fresh fruits and vegetables (produce). Grains, beans, nuts, and seeds. Some of these may be available in unpackaged forms or large amounts (in bulk). Fresh seafood. Poultry and eggs. Low-fat dairy products. Buy whole ingredients instead of prepackaged foods. Buy fresh fruits and vegetables in-season from local farmers markets. Buy frozen fruits and vegetables in resealable bags. If you do not have access to quality fresh seafood, buy precooked frozen shrimp or canned  fish, such as tuna, salmon, or sardines. Buy small amounts of raw or cooked vegetables, salads, or olives from the deli or salad bar at your store. Stock your pantry so you always have certain foods on hand, such as olive oil, canned tuna, canned tomatoes, rice, pasta, and beans. Cooking  Cook foods with extra-virgin olive oil instead of using butter or other vegetable oils. Have meat  as a side dish, and have vegetables or grains as your main dish. This means having meat in small portions or adding small amounts of meat to foods like pasta or stew. Use beans or vegetables instead of meat in common dishes like chili or lasagna. Experiment with different cooking methods. Try roasting or broiling vegetables instead of steaming or sauteing them. Add frozen vegetables to soups, stews, pasta, or rice. Add nuts or seeds for added healthy fat at each meal. You can add these to yogurt, salads, or vegetable dishes. Marinate fish or vegetables using olive oil, lemon juice, garlic, and fresh herbs. Meal planning  Plan to eat 1 vegetarian meal one day each week. Try to work up to 2 vegetarian meals, if possible. Eat seafood 2 or more times a week. Have healthy snacks readily available, such as: Vegetable sticks with hummus. Greek yogurt. Fruit and nut trail mix. Eat balanced meals throughout the week. This includes: Fruit: 2-3 servings a day Vegetables: 4-5 servings a day Low-fat dairy: 2 servings a day Fish, poultry, or lean meat: 1 serving a day Beans and legumes: 2 or more servings a week Nuts and seeds: 1-2 servings a day Whole grains: 6-8 servings a day Extra-virgin olive oil: 3-4 servings a day Limit red meat and sweets to only a few servings a month What are my food choices? Mediterranean diet Recommended Grains: Whole-grain pasta. Brown rice. Bulgar wheat. Polenta. Couscous. Whole-wheat bread. [place]. Vegetables: Artichokes. Beets. Broccoli. Cabbage. Carrots. Eggplant. Green beans. Chard. Kale. Spinach. Onions. Leeks. Peas. Squash. Tomatoes. Peppers. Radishes. Fruits: Apples. Apricots. Avocado. Berries. Bananas. Cherries. Dates. Figs. Grapes. Lemons. Melon. Oranges. Peaches. Plums. Pomegranate. Meats and other protein foods: Beans. Almonds. Sunflower seeds. Pine nuts. Peanuts. [place]. Salmon. Scallops. Shrimp. [place]. Tilapia. Clams. Oysters. Eggs. Dairy: Low-fat  milk. Cheese. Greek yogurt. Beverages: Water. Red wine. Herbal tea. Fats and oils: Extra virgin olive oil. Avocado oil. Grape seed oil. Sweets and desserts: [place] yogurt with honey. Baked apples. Poached pears. Trail mix. Seasoning and other foods: Basil. Cilantro. Coriander. Cumin. Mint. Parsley. Sage. Rosemary. Tarragon. Garlic. Oregano. Thyme. Pepper. Balsalmic vinegar. Tahini. Hummus. Tomato sauce. Olives. Mushrooms. Limit these Grains: Prepackaged pasta or rice dishes. Prepackaged cereal with added sugar. Vegetables: Deep fried potatoes (french fries). Fruits: Fruit canned in syrup. Meats and other protein foods: Beef. Pork. Lamb. Poultry with skin. Hot dogs. [place] Salines. Dairy: Ice cream. Sour cream. Whole milk. Beverages: Juice. Sugar-sweetened soft drinks. Beer. Liquor and spirits. Fats and oils: Butter. Canola oil. Vegetable oil. Beef fat (tallow). Lard. Sweets and desserts: Cookies. Cakes. Pies. Candy. Seasoning and other foods: Mayonnaise. Premade sauces and marinades. The items listed may not be a complete list. Talk with your dietitian about what dietary choices are right for you. Summary The Mediterranean diet includes both food and lifestyle choices. Eat a variety of fresh fruits and vegetables, beans, nuts, seeds, and whole grains. Limit the amount of red meat and sweets that you eat. Talk with your health care provider about whether it is safe for you to drink red wine in moderation. This means 1 glass a day  for nonpregnant women and 2 glasses a day for men. A glass of wine equals 5 oz (150 mL). This information is not intended to replace advice given to you by your health care provider. Make sure you discuss any questions you have with your health care provider. Document Released: 07/22/2016 Document Revised: 08/24/2016 Document Reviewed: 07/22/2016 Elsevier Interactive Patient Education  2017 [employer].

## 2022-05-03 NOTE — Progress Notes (Signed)
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  The patient was last seen in 12 June 2020, losing to follow-up after the death of her husband.  Initially, the family felt that her memory decline was due to depression and grieving, but these memory changes continued and worsened over the last 6 months.  The patient is on donepezil 5 mg daily, tolerating well.  In addition, she had a head injury after mechanical fall without loss of consciousness in July 2022, with a CT of the head showing cerebral global volume loss, as well as stable prominence of the lateral and third ventricles, with mild periventricular and deep matter hypodensity.  The patient today does not show any significant signs of NPH, she is able to ambulate, and despite her worsening memory loss, she does not have worsening urine incontinence to suggest NPH.   Recommendations:    Continue donepezil but increase it to 10 mg daily in view of progression of the disease.  Side effects were discussed Follow up in 6 months. Recommend to follow-up with PCP the treatment of depression.  The patient discontinued by herself Prozac, and she needs more control.   Case discussed with Dr. Delice Lesch who agrees with the plan   Subjective:    CORINN STOLTZFUS is a very pleasant 76 y.o. RH female  seen today in follow up for memory loss. This patient is accompanied in the office by her son who supplements the history.  Previous records as well as any outside records available were reviewed by me prior to todays visit.  Patient was last seen at our office on July 2021 at which time her MMSE was 29.  The patient lost his follow-up, after the death of her husband.  She is on donepezil 5 mg daily.  Patient is currently on   Any changes in memory since last  visit? About 1.5 y ago her memory has been worse, forgetting ingredients, common recipes, and also repeating  more frequently her stories and questions.  Her family thought that this changes were due to grieving process of depression, after the death of her husband, but the symptoms continued, and today became worse despite the use of Prozac.  Her short-term memory is much worse than her long-term memory although her long-term memory is affected as well. Patient lives with: her son  Disoriented when walking into a room?  Endorsed, she does not recognize her house, "when are we going home?  "-She says to her son even if she lives there."pick me up and take me home" .  Social worker is involved and is coming to the house for the first time to evaluate for [facility] for safety. Leaving objects in unusual places?  Endorsed, she places day spatulas in the silverware drawer and she puts the cups in different places around the house.  Ambulates  with difficulty?  She is s/p knee arthroplasty in 2/22 and also after a fall, hitting her hip.  She is receiving injections and taking gabapentin with some relief. Walking slowly and denies feet grounded on the floor.  She uses a walker to ambulate Recent falls?  Most recent mechanical fall was 2 weeks ago when she tripped when I table, no LOC or head injury.  She had another fall 1 year ago hitting the frontal area, sustaining a laceration, no loss  of consciousness.  CT of the head was negative for acute findings, but there is more global volume loss from prior. History of seizures?   Patient denies   Wandering behavior?  Patient denies   Patient drives?   Patient no longer drives ( since 1 year ago ) because she couldn't find her way home from the grocery store and her children decided to not let her drive anymore Any mood changes such irritability agitation? Denies    Any history of depression?:  Patient calls her children when she is upset.  She cries frequently.   Initially, her PCP placed her on Prozac, but she has not been taking it on a regular basis, but now she wants to return to use it.  She has an appointment with her PCP soon regarding this issue. Hallucinations?  Patient denies   Paranoia?  Endorsed. She makes her son look at the room for strangers prior to going to sleep. Patient reports that he sleeps well without vivid dreams, REM behavior or sleepwalking   History of sleep apnea?  Patient denies   Any hygiene concerns?  Patient denies   Independent of bathing and dressing?  Endorsed  Does the patient needs help with medications? Son is in charge  Who is in charge of the finances?  Daughter in law is in charge  Any changes in appetite?  Appetite is decreased, occasionally she forgets to eat lunch.  Son is to buy some Ensure and has been Madelynn Done Patient have trouble swallowing? Patient denies   Does the patient cook?  Patient denies   Any kitchen accidents such as leaving the stove on? One time 1 year ago, she no longer cooks now Any headaches?  Patient denies   The double vision? Patient denies   Any focal numbness or tingling?  Patient denies   Chronic back pain Patient denies   Unilateral weakness?  Patient denies   Any tremors?  she has an very mild jaw tremor  since Dec 2022  Any history of anosmia?  Patient denies, no sense of taste since Covid x 2 y ago    Any incontinence of urine?  Patient reports some "accidents" but not very frequent, and uses pads  Any bowel dysfunction?  Patient denies  Update 06/24/20 I had the pleasure of seeing Giulianna Rocha in follow-up in the neurology clinic on 06/24/2020.  The patient was last seen 7 months ago for worsening memory. She is again accompanied by her husband who helps supplement the history today.  Records and images were personally reviewed where available.  I personally reviewed MRI brain without contrast done 12/2019 which did not show any acute changes. There was mild to moderate diffuse  atrophy with no lobar predominance seen. Note of prominence of lateral and third ventricles slightly increased since 2016, favored secondary to parenchymal volume loss. She underwent Neuropsychological testing last February 2021 with a diagnosis of Mild Neurocognitive Disorder. There were primary impairments in new learning and memory, semantic fluency, and confrontation naming. Etiology of deficits unclear, pattern of performance was somewhat concerning for early stages of Alzheimer's disease, however other scores were not wholly suggestive of a memory storage deficit, which is inconsistent with what is expected with AD.    Since her last visit, her husband reports that she repeats herself. She continues to manage her own medications and bills without difficulties. She rarely drives, and only drives short distances without issues. Her husband reports she used to cook for large groups but  now does not cook anymore. She has lost her sense of taste. She had to write down 2 favorite recipes. Her husband was previously reporting paranoia, she continues to have an irrational fear that someone will shoot them in their home, she closes the blinds and turns the lights off, feeling someone is watching her. She has also been having auditory hallucinations, saying she hears a voice saying the same thing repeatedly. She cannot remember the word they say. Her husband says she head voices the other night or asks him if he hears the people outside the bedroom. It appears to be related to her CPAP machine, it makes a noise and she interprets the noise as a voice. The voice stops when she stops her machine, it only occurs at night. She denies any headaches, dizziness, no falls. She has left knee pain and uses her walker majority of the time.     History on Initial Assessment 11/26/2019: This is a pleasant 76 year old right-handed woman with a history of hypertension, hyperlipidemia, thyroid cancer s/p partial thyroidectomy,  presenting for evaluation of memory loss. She does not think her memory is good, there are certain things she cannot remember. She looks to her husband for answers several times during the visit. She lives with her husband and son. She manages medications without difficulties and denies getting lost driving. She became concerned about her memory because she had always used to do their checkbook but in February, something was not right. She could not figure it out, and stopped doing it since then. She states she is not even sure she can do it now. Her husband does not think there is anything significantly concerning about her memory. He reports the checkbook issue "threw her off." She gets flustered when she does not remember things. He has not noticed any personality changes but they both note that she is more paranoid, closing the blinds at night. They have lived in the same house for 37 years but for the past 6 months, she feels like someone is always looking in their house and will shoot at them, "I don't know why." She and her husband deny any hallucinations. She has always been worried about things, and is anxious today. She states mood is "I don't know," she is afraid to do some things but thinks mood is alright. She notes some depression due to inability to travel like before. Her mother had Alzheimer's disease. No history of significant head injuries. She rarely drinks alcohol.    She denies any headaches, diplopia, dysarthria/dysphagia, neck/back pain, focal numbness/tingling/weakness, bowel/bladder dysfunction, tremors. She had a lot of dizziness for a time where she would feel lightheaded and have to hold on. I personally reviewed head CT without contrast done in August 2020 for dizziness which did not show any acute changes, there was moderate diffuse atrophy. She had an infection prior to the start of Covid-19 where she lost her sense of taste and smell, it has not come back. She usually gets 7 hours  of sleep with her CPAP machine, waking up at 4am. She is occasionally drowsy in the day.    PREVIOUS MEDICATIONS:   CURRENT MEDICATIONS:  Outpatient Encounter Medications as of 05/03/2022  Medication Sig   amLODipine (NORVASC) 2.5 MG tablet Take 1 tablet (2.5 mg total) by mouth daily.   Azelastine HCl 137 MCG/SPRAY SOLN PLACE 2 SPRAYS INTO BOTH NOSTRILS 2 (TWO) TIMES DAILY.   benzonatate (TESSALON PERLES) 100 MG capsule Take 1-2 capsules (100-200 mg  total) by mouth 3 (three) times daily as needed for cough.   Black Pepper-Turmeric (TURMERIC CURCUMIN) 04-999 MG CAPS 1 tablet daily.   carboxymethylcellulose (REFRESH PLUS) 0.5 % SOLN Place 1 drop into both eyes 3 (three) times daily as needed (dry eyes).   Cholecalciferol-Vitamin C (VITAMIN D3-VITAMIN C) 1000-500 UNIT-MG CAPS daily.   docusate sodium (COLACE) 100 MG capsule Take 100 mg by mouth daily.   famotidine (PEPCID) 20 MG tablet TAKE 2 TABLETS BY MOUTH EVERY DAY   FLUoxetine (PROZAC) 10 MG tablet TAKE 1 TABLET BY MOUTH EVERY DAY   fluticasone (FLONASE) 50 MCG/ACT nasal spray PRN (seasonal)   gabapentin (NEURONTIN) 300 MG capsule Take 1 capsule (300 mg total) by mouth at bedtime.   hydrochlorothiazide (HYDRODIURIL) 25 MG tablet TAKE 1 TABLET (25 MG TOTAL) BY MOUTH DAILY.   levothyroxine (SYNTHROID) 112 MCG tablet Take 1 tablet (112 mcg total) by mouth daily before breakfast.   losartan (COZAAR) 100 MG tablet TAKE 1 TABLET BY MOUTH EVERYDAY AT BEDTIME   metoprolol tartrate (LOPRESSOR) 100 MG tablet TAKE 1&1/2 TABLETS BY MOUTH TWICE A DAY   naproxen (NAPROSYN) 375 MG tablet TAKE 1 TABLET (375 MG TOTAL) BY MOUTH 2 (TWO) TIMES DAILY AS NEEDED FOR MODERATE PAIN OR HEADACHE.   nitroGLYCERIN (NITROSTAT) 0.4 MG SL tablet PLACE 1 TABLET UNDER THE TONGUE EVERY 5 MINUTES AS NEEDED FOR CHEST PAIN   omeprazole (PRILOSEC) 40 MG capsule TAKE 1 CAPSULE BY MOUTH TWICE A DAY   polyethylene glycol (MIRALAX / GLYCOLAX) 17 g packet Take 17 g by mouth daily.    Psyllium-Calcium (METAMUCIL PLUS CALCIUM) CAPS daily.   rosuvastatin (CRESTOR) 40 MG tablet TAKE 1 TABLET BY MOUTH EVERY DAY   temazepam (RESTORIL) 7.5 MG capsule 1 po every other day for 1 week, then every third day for 1 week.   [DISCONTINUED] donepezil (ARICEPT) 5 MG tablet Take 1 tablet (5 mg total) by mouth at bedtime.   donepezil (ARICEPT) 10 MG tablet Take one tab daily   No facility-administered encounter medications on file as of 05/03/2022.       05/03/2022    3:00 PM 05/25/2018    3:25 PM 05/24/2017    3:31 PM  MMSE - Mini Mental State Exam  Orientation to time 0 5 5  Orientation to Place 0 5 5  Registration '3 3 3  '$ Attention/ Calculation '2 5 5  '$ Recall 0 2 3  Language- name 2 objects '2 2 2  '$ Language- repeat '1 1 1  '$ Language- follow 3 step command '3 3 3  '$ Language- read & follow direction '1 1 1  '$ Write a sentence 0 1 1  Copy design 0 1 1  Total score '12 29 30       '$ View : No data to display.          Objective:     PHYSICAL EXAMINATION:    VITALS:   Vitals:   05/03/22 1429  BP: (!) 183/87  Pulse: (!) 106  Resp: 20  SpO2: 98%  Weight: 175 lb (79.4 kg)  Height: '5\' 6"'$  (1.676 m)    GEN:  The patient appears stated age and is in NAD. HEENT:  Normocephalic, atraumatic.   Neurological examination:  General: NAD, well-groomed, appears stated age. Orientation: The patient is alert. Oriented to person not to place or date. Cranial nerves: There is good facial symmetry.The speech is fluent and clear. No aphasia or dysarthria. Fund of knowledge is reduced. Recent and remote memory are impaired.  Attention and concentration are reduced.  Able to name objects and repeat phrases.  Hearing is intact to conversational tone.    Sensation: Sensation is intact to light touch throughout Motor: Strength is at least antigravity x4. Tremors: There is mental tremor, DTR's 2/4 in UE/LE     Movement examination: Tone: There is normal tone in the UE/LE no cogwheeling  noted Abnormal movements: Mental tremor noted.  No myoclonus.  No asterixis.   Coordination:  There is no decremation with RAM's. Normal finger to nose  Gait and Station: The patient has  difficulty arising out of a deep-seated chair without the use of the hands.  She needs a walker to ambulate the patient's stride length is short gait is cautious and narrow.    Thank you for allowing [place] the opportunity to participate in the care of this nice patient. Please do not hesitate to contact us for any questions or concerns.   Total time spent on today's visit was 62 minutes dedicated to this patient today, preparing to see patient, examining the patient, ordering tests and/or medications and counseling the patient, documenting clinical information in the EHR or other health record, independently interpreting results and communicating results to the patient/family, discussing treatment and goals, answering patient's questions and coordinating care.  Cc:  Mosie Lukes, MD  Sharene Butters 05/03/2022 3:50 PM

## 2022-05-04 ENCOUNTER — Telehealth: Payer: Self-pay

## 2022-05-04 DIAGNOSIS — K219 Gastro-esophageal reflux disease without esophagitis: Secondary | ICD-10-CM | POA: Diagnosis not present

## 2022-05-04 DIAGNOSIS — I10 Essential (primary) hypertension: Secondary | ICD-10-CM | POA: Diagnosis not present

## 2022-05-04 DIAGNOSIS — K648 Other hemorrhoids: Secondary | ICD-10-CM | POA: Diagnosis not present

## 2022-05-04 DIAGNOSIS — J309 Allergic rhinitis, unspecified: Secondary | ICD-10-CM | POA: Diagnosis not present

## 2022-05-04 DIAGNOSIS — M19041 Primary osteoarthritis, right hand: Secondary | ICD-10-CM | POA: Diagnosis not present

## 2022-05-04 DIAGNOSIS — H6690 Otitis media, unspecified, unspecified ear: Secondary | ICD-10-CM | POA: Diagnosis not present

## 2022-05-04 DIAGNOSIS — R69 Illness, unspecified: Secondary | ICD-10-CM | POA: Diagnosis not present

## 2022-05-04 DIAGNOSIS — K439 Ventral hernia without obstruction or gangrene: Secondary | ICD-10-CM | POA: Diagnosis not present

## 2022-05-04 DIAGNOSIS — E538 Deficiency of other specified B group vitamins: Secondary | ICD-10-CM | POA: Diagnosis not present

## 2022-05-04 DIAGNOSIS — M19012 Primary osteoarthritis, left shoulder: Secondary | ICD-10-CM | POA: Diagnosis not present

## 2022-05-04 DIAGNOSIS — H60399 Other infective otitis externa, unspecified ear: Secondary | ICD-10-CM | POA: Diagnosis not present

## 2022-05-04 DIAGNOSIS — K5900 Constipation, unspecified: Secondary | ICD-10-CM | POA: Diagnosis not present

## 2022-05-04 DIAGNOSIS — G4700 Insomnia, unspecified: Secondary | ICD-10-CM | POA: Diagnosis not present

## 2022-05-04 DIAGNOSIS — D649 Anemia, unspecified: Secondary | ICD-10-CM | POA: Diagnosis not present

## 2022-05-04 DIAGNOSIS — M069 Rheumatoid arthritis, unspecified: Secondary | ICD-10-CM | POA: Diagnosis not present

## 2022-05-04 DIAGNOSIS — G4733 Obstructive sleep apnea (adult) (pediatric): Secondary | ICD-10-CM | POA: Diagnosis not present

## 2022-05-04 DIAGNOSIS — E559 Vitamin D deficiency, unspecified: Secondary | ICD-10-CM | POA: Diagnosis not present

## 2022-05-04 DIAGNOSIS — H9319 Tinnitus, unspecified ear: Secondary | ICD-10-CM | POA: Diagnosis not present

## 2022-05-04 DIAGNOSIS — E782 Mixed hyperlipidemia: Secondary | ICD-10-CM | POA: Diagnosis not present

## 2022-05-04 DIAGNOSIS — G3184 Mild cognitive impairment, so stated: Secondary | ICD-10-CM | POA: Diagnosis not present

## 2022-05-04 DIAGNOSIS — J4520 Mild intermittent asthma, uncomplicated: Secondary | ICD-10-CM | POA: Diagnosis not present

## 2022-05-04 NOTE — Telephone Encounter (Signed)
   Telephone encounter was:  Successful.  05/04/2022 Name: Jackie Stevens MRN: [id number] DOB: [birth year]  [name] is a 76 y.o. year old female who is a primary care patient of Mosie Lukes, MD . The community resource team was consulted for assistance with Caregiver Stress and dementia resources, in home care and grab bar.  Care guide performed the following interventions: Spoke with patient's son Carmelle Bamberg. He has received a brochure for [facility] and the [employer] from the Education officer, museum. Verified his email address [email or web address] to send information for the [facility] program andThe [facility].  He also consented to having [facility] referrals placed to [facility] and [facility].   Follow Up Plan:  Care guide will follow up with patient by phone over the next 7 days.  Lea Walbert, AAS Paralegal, [facility]  [address], [place] [address] ??[email or web address]  ?? [phone]   [email or web address]

## 2022-05-05 ENCOUNTER — Other Ambulatory Visit: Payer: Self-pay | Admitting: Family Medicine

## 2022-05-05 ENCOUNTER — Telehealth: Payer: Medicare HMO

## 2022-05-05 DIAGNOSIS — K5900 Constipation, unspecified: Secondary | ICD-10-CM | POA: Diagnosis not present

## 2022-05-05 DIAGNOSIS — H60399 Other infective otitis externa, unspecified ear: Secondary | ICD-10-CM | POA: Diagnosis not present

## 2022-05-05 DIAGNOSIS — K439 Ventral hernia without obstruction or gangrene: Secondary | ICD-10-CM | POA: Diagnosis not present

## 2022-05-05 DIAGNOSIS — E559 Vitamin D deficiency, unspecified: Secondary | ICD-10-CM | POA: Diagnosis not present

## 2022-05-05 DIAGNOSIS — G4700 Insomnia, unspecified: Secondary | ICD-10-CM | POA: Diagnosis not present

## 2022-05-05 DIAGNOSIS — E538 Deficiency of other specified B group vitamins: Secondary | ICD-10-CM | POA: Diagnosis not present

## 2022-05-05 DIAGNOSIS — K219 Gastro-esophageal reflux disease without esophagitis: Secondary | ICD-10-CM | POA: Diagnosis not present

## 2022-05-05 DIAGNOSIS — M19041 Primary osteoarthritis, right hand: Secondary | ICD-10-CM | POA: Diagnosis not present

## 2022-05-05 DIAGNOSIS — R69 Illness, unspecified: Secondary | ICD-10-CM | POA: Diagnosis not present

## 2022-05-05 DIAGNOSIS — H6690 Otitis media, unspecified, unspecified ear: Secondary | ICD-10-CM | POA: Diagnosis not present

## 2022-05-05 DIAGNOSIS — M19012 Primary osteoarthritis, left shoulder: Secondary | ICD-10-CM | POA: Diagnosis not present

## 2022-05-05 DIAGNOSIS — H9319 Tinnitus, unspecified ear: Secondary | ICD-10-CM | POA: Diagnosis not present

## 2022-05-05 DIAGNOSIS — G3184 Mild cognitive impairment, so stated: Secondary | ICD-10-CM | POA: Diagnosis not present

## 2022-05-05 DIAGNOSIS — K648 Other hemorrhoids: Secondary | ICD-10-CM | POA: Diagnosis not present

## 2022-05-05 DIAGNOSIS — M069 Rheumatoid arthritis, unspecified: Secondary | ICD-10-CM | POA: Diagnosis not present

## 2022-05-05 DIAGNOSIS — J4520 Mild intermittent asthma, uncomplicated: Secondary | ICD-10-CM | POA: Diagnosis not present

## 2022-05-05 DIAGNOSIS — D649 Anemia, unspecified: Secondary | ICD-10-CM | POA: Diagnosis not present

## 2022-05-05 DIAGNOSIS — I10 Essential (primary) hypertension: Secondary | ICD-10-CM | POA: Diagnosis not present

## 2022-05-05 DIAGNOSIS — G4733 Obstructive sleep apnea (adult) (pediatric): Secondary | ICD-10-CM | POA: Diagnosis not present

## 2022-05-05 DIAGNOSIS — E782 Mixed hyperlipidemia: Secondary | ICD-10-CM | POA: Diagnosis not present

## 2022-05-05 DIAGNOSIS — J309 Allergic rhinitis, unspecified: Secondary | ICD-10-CM | POA: Diagnosis not present

## 2022-05-07 ENCOUNTER — Telehealth: Payer: Self-pay

## 2022-05-07 DIAGNOSIS — K648 Other hemorrhoids: Secondary | ICD-10-CM | POA: Diagnosis not present

## 2022-05-07 DIAGNOSIS — I10 Essential (primary) hypertension: Secondary | ICD-10-CM | POA: Diagnosis not present

## 2022-05-07 DIAGNOSIS — M069 Rheumatoid arthritis, unspecified: Secondary | ICD-10-CM | POA: Diagnosis not present

## 2022-05-07 DIAGNOSIS — E782 Mixed hyperlipidemia: Secondary | ICD-10-CM | POA: Diagnosis not present

## 2022-05-07 DIAGNOSIS — G4700 Insomnia, unspecified: Secondary | ICD-10-CM | POA: Diagnosis not present

## 2022-05-07 DIAGNOSIS — M19012 Primary osteoarthritis, left shoulder: Secondary | ICD-10-CM | POA: Diagnosis not present

## 2022-05-07 DIAGNOSIS — R69 Illness, unspecified: Secondary | ICD-10-CM | POA: Diagnosis not present

## 2022-05-07 DIAGNOSIS — H60399 Other infective otitis externa, unspecified ear: Secondary | ICD-10-CM | POA: Diagnosis not present

## 2022-05-07 DIAGNOSIS — H6690 Otitis media, unspecified, unspecified ear: Secondary | ICD-10-CM | POA: Diagnosis not present

## 2022-05-07 DIAGNOSIS — J4520 Mild intermittent asthma, uncomplicated: Secondary | ICD-10-CM | POA: Diagnosis not present

## 2022-05-07 DIAGNOSIS — G4733 Obstructive sleep apnea (adult) (pediatric): Secondary | ICD-10-CM | POA: Diagnosis not present

## 2022-05-07 DIAGNOSIS — J309 Allergic rhinitis, unspecified: Secondary | ICD-10-CM | POA: Diagnosis not present

## 2022-05-07 DIAGNOSIS — K5900 Constipation, unspecified: Secondary | ICD-10-CM | POA: Diagnosis not present

## 2022-05-07 DIAGNOSIS — K219 Gastro-esophageal reflux disease without esophagitis: Secondary | ICD-10-CM | POA: Diagnosis not present

## 2022-05-07 DIAGNOSIS — M19041 Primary osteoarthritis, right hand: Secondary | ICD-10-CM | POA: Diagnosis not present

## 2022-05-07 DIAGNOSIS — H9319 Tinnitus, unspecified ear: Secondary | ICD-10-CM | POA: Diagnosis not present

## 2022-05-07 DIAGNOSIS — G3184 Mild cognitive impairment, so stated: Secondary | ICD-10-CM | POA: Diagnosis not present

## 2022-05-07 DIAGNOSIS — D649 Anemia, unspecified: Secondary | ICD-10-CM | POA: Diagnosis not present

## 2022-05-07 DIAGNOSIS — E559 Vitamin D deficiency, unspecified: Secondary | ICD-10-CM | POA: Diagnosis not present

## 2022-05-07 DIAGNOSIS — K439 Ventral hernia without obstruction or gangrene: Secondary | ICD-10-CM | POA: Diagnosis not present

## 2022-05-07 DIAGNOSIS — E538 Deficiency of other specified B group vitamins: Secondary | ICD-10-CM | POA: Diagnosis not present

## 2022-05-07 NOTE — Telephone Encounter (Signed)
   Telephone encounter was:  Successful.  05/07/2022 Name: Jackie Stevens MRN: [id number] DOB: October 12, 1946  [name] is a 76 y.o. year old female who is a primary care patient of Mosie Lukes, MD . The community resource team was consulted for assistance with  grab bars.  Care guide performed the following interventions: Received message via [id number] from Meredith Pel, Options Counselor [facility] reached out and contacted the son. He stated that they are not in need of Options Counseling and only wanted a list of in-home aide agencies, which were emailed to him. Called [facility], spoke with YVonne Horner to check on the  status of [id number] referral placed 5/23. She stated that the referral would go to her co-worker Tonie and to call back Tuesday 05/11/22.  Follow Up Plan:  Care guide will follow up with patient by phone over the next 7 days.  Raymond Bhardwaj, AAS Paralegal, [facility]  [address], [place] [address] ??[email or web address]  ?? [phone]   [email or web address]

## 2022-05-11 ENCOUNTER — Telehealth: Payer: Self-pay

## 2022-05-11 DIAGNOSIS — K648 Other hemorrhoids: Secondary | ICD-10-CM | POA: Diagnosis not present

## 2022-05-11 DIAGNOSIS — J309 Allergic rhinitis, unspecified: Secondary | ICD-10-CM | POA: Diagnosis not present

## 2022-05-11 DIAGNOSIS — G4700 Insomnia, unspecified: Secondary | ICD-10-CM | POA: Diagnosis not present

## 2022-05-11 DIAGNOSIS — J4520 Mild intermittent asthma, uncomplicated: Secondary | ICD-10-CM | POA: Diagnosis not present

## 2022-05-11 DIAGNOSIS — M069 Rheumatoid arthritis, unspecified: Secondary | ICD-10-CM | POA: Diagnosis not present

## 2022-05-11 DIAGNOSIS — E559 Vitamin D deficiency, unspecified: Secondary | ICD-10-CM | POA: Diagnosis not present

## 2022-05-11 DIAGNOSIS — E538 Deficiency of other specified B group vitamins: Secondary | ICD-10-CM | POA: Diagnosis not present

## 2022-05-11 DIAGNOSIS — M19012 Primary osteoarthritis, left shoulder: Secondary | ICD-10-CM | POA: Diagnosis not present

## 2022-05-11 DIAGNOSIS — K439 Ventral hernia without obstruction or gangrene: Secondary | ICD-10-CM | POA: Diagnosis not present

## 2022-05-11 DIAGNOSIS — H60399 Other infective otitis externa, unspecified ear: Secondary | ICD-10-CM | POA: Diagnosis not present

## 2022-05-11 DIAGNOSIS — H6690 Otitis media, unspecified, unspecified ear: Secondary | ICD-10-CM | POA: Diagnosis not present

## 2022-05-11 DIAGNOSIS — G3184 Mild cognitive impairment, so stated: Secondary | ICD-10-CM | POA: Diagnosis not present

## 2022-05-11 DIAGNOSIS — E782 Mixed hyperlipidemia: Secondary | ICD-10-CM | POA: Diagnosis not present

## 2022-05-11 DIAGNOSIS — M19041 Primary osteoarthritis, right hand: Secondary | ICD-10-CM | POA: Diagnosis not present

## 2022-05-11 DIAGNOSIS — G4733 Obstructive sleep apnea (adult) (pediatric): Secondary | ICD-10-CM | POA: Diagnosis not present

## 2022-05-11 DIAGNOSIS — K5900 Constipation, unspecified: Secondary | ICD-10-CM | POA: Diagnosis not present

## 2022-05-11 DIAGNOSIS — H9319 Tinnitus, unspecified ear: Secondary | ICD-10-CM | POA: Diagnosis not present

## 2022-05-11 DIAGNOSIS — R69 Illness, unspecified: Secondary | ICD-10-CM | POA: Diagnosis not present

## 2022-05-11 DIAGNOSIS — K219 Gastro-esophageal reflux disease without esophagitis: Secondary | ICD-10-CM | POA: Diagnosis not present

## 2022-05-11 DIAGNOSIS — I10 Essential (primary) hypertension: Secondary | ICD-10-CM | POA: Diagnosis not present

## 2022-05-11 DIAGNOSIS — D649 Anemia, unspecified: Secondary | ICD-10-CM | POA: Diagnosis not present

## 2022-05-11 NOTE — Telephone Encounter (Signed)
   Telephone encounter was:  Successful.  05/11/2022 Name: [name] MRN: [id number] DOB: [birth year]  Jackie Stevens is a 76 y.o. year old female who is a primary care patient of Mosie Lukes, MD . The community resource team was consulted for assistance with  shower grab bars.  Care guide performed the following interventions: Spoke with Berton Lan at [facility] regarding [facility] referral placed 05/04/22 for grab bars in the shower.  She was unable to find the referral and transferred me to Filbert Schilder her manager and she was also unable to find the referral and stated they are very behind and would call me back with an update.   Follow Up Plan:   I will call the patient with an update once I receive a call from Filbert Schilder at [facility].  [name], AAS Paralegal, [facility]  [address], [place] [address] ??[email or web address]  ?? [phone]   [email or web address]

## 2022-05-12 ENCOUNTER — Other Ambulatory Visit: Payer: Self-pay | Admitting: Family Medicine

## 2022-05-12 DIAGNOSIS — R69 Illness, unspecified: Secondary | ICD-10-CM | POA: Diagnosis not present

## 2022-05-12 DIAGNOSIS — D649 Anemia, unspecified: Secondary | ICD-10-CM | POA: Diagnosis not present

## 2022-05-12 DIAGNOSIS — K648 Other hemorrhoids: Secondary | ICD-10-CM | POA: Diagnosis not present

## 2022-05-12 DIAGNOSIS — J4520 Mild intermittent asthma, uncomplicated: Secondary | ICD-10-CM | POA: Diagnosis not present

## 2022-05-12 DIAGNOSIS — K219 Gastro-esophageal reflux disease without esophagitis: Secondary | ICD-10-CM | POA: Diagnosis not present

## 2022-05-12 DIAGNOSIS — G4733 Obstructive sleep apnea (adult) (pediatric): Secondary | ICD-10-CM | POA: Diagnosis not present

## 2022-05-12 DIAGNOSIS — H60399 Other infective otitis externa, unspecified ear: Secondary | ICD-10-CM | POA: Diagnosis not present

## 2022-05-12 DIAGNOSIS — M19041 Primary osteoarthritis, right hand: Secondary | ICD-10-CM | POA: Diagnosis not present

## 2022-05-12 DIAGNOSIS — J309 Allergic rhinitis, unspecified: Secondary | ICD-10-CM | POA: Diagnosis not present

## 2022-05-12 DIAGNOSIS — H9319 Tinnitus, unspecified ear: Secondary | ICD-10-CM | POA: Diagnosis not present

## 2022-05-12 DIAGNOSIS — F0394 Unspecified dementia, unspecified severity, with anxiety: Secondary | ICD-10-CM

## 2022-05-12 DIAGNOSIS — M069 Rheumatoid arthritis, unspecified: Secondary | ICD-10-CM | POA: Diagnosis not present

## 2022-05-12 DIAGNOSIS — G4700 Insomnia, unspecified: Secondary | ICD-10-CM | POA: Diagnosis not present

## 2022-05-12 DIAGNOSIS — E782 Mixed hyperlipidemia: Secondary | ICD-10-CM | POA: Diagnosis not present

## 2022-05-12 DIAGNOSIS — I10 Essential (primary) hypertension: Secondary | ICD-10-CM | POA: Diagnosis not present

## 2022-05-12 DIAGNOSIS — H6690 Otitis media, unspecified, unspecified ear: Secondary | ICD-10-CM | POA: Diagnosis not present

## 2022-05-12 DIAGNOSIS — E559 Vitamin D deficiency, unspecified: Secondary | ICD-10-CM | POA: Diagnosis not present

## 2022-05-12 DIAGNOSIS — K439 Ventral hernia without obstruction or gangrene: Secondary | ICD-10-CM | POA: Diagnosis not present

## 2022-05-12 DIAGNOSIS — K5900 Constipation, unspecified: Secondary | ICD-10-CM | POA: Diagnosis not present

## 2022-05-12 DIAGNOSIS — M19012 Primary osteoarthritis, left shoulder: Secondary | ICD-10-CM | POA: Diagnosis not present

## 2022-05-12 DIAGNOSIS — E538 Deficiency of other specified B group vitamins: Secondary | ICD-10-CM | POA: Diagnosis not present

## 2022-05-12 DIAGNOSIS — G3184 Mild cognitive impairment, so stated: Secondary | ICD-10-CM | POA: Diagnosis not present

## 2022-05-13 ENCOUNTER — Telehealth: Payer: Self-pay

## 2022-05-13 ENCOUNTER — Ambulatory Visit (INDEPENDENT_AMBULATORY_CARE_PROVIDER_SITE_OTHER): Payer: Medicare HMO | Admitting: *Deleted

## 2022-05-13 DIAGNOSIS — F0394 Unspecified dementia, unspecified severity, with anxiety: Secondary | ICD-10-CM

## 2022-05-13 DIAGNOSIS — I10 Essential (primary) hypertension: Secondary | ICD-10-CM

## 2022-05-13 DIAGNOSIS — R2681 Unsteadiness on feet: Secondary | ICD-10-CM

## 2022-05-13 DIAGNOSIS — E782 Mixed hyperlipidemia: Secondary | ICD-10-CM

## 2022-05-13 DIAGNOSIS — R739 Hyperglycemia, unspecified: Secondary | ICD-10-CM

## 2022-05-13 DIAGNOSIS — R202 Paresthesia of skin: Secondary | ICD-10-CM

## 2022-05-13 NOTE — Telephone Encounter (Signed)
   Telephone encounter was:  Successful.  05/13/2022 Name: Jackie Stevens MRN: [id number] DOB: Jul 15, 1946  [name] is a 76 y.o. year old female who is a primary care patient of Mosie Lukes, MD . The community resource team was consulted for assistance with  shower grab bars  Care guide performed the following interventions: Bill Salinas at [facility]: Spoke with client son today, [name] and explained VR process to him and he decided not to pursue case with VR. He stated she only need grab bars for bathroom to assist his mom getting end and out of shower, and he will purchase those and install himself. I let him know if they need other services, please reach out to VR. Referral will be close out.   Follow Up Plan:  No further follow up planned at this time. The patient has been provided with needed resources.  Efraim Vanallen, AAS Paralegal, [facility]  [address], [place] [address] ??[email or web address]  ?? [phone]   [email or web address]

## 2022-05-14 ENCOUNTER — Telehealth: Payer: Self-pay | Admitting: Family Medicine

## 2022-05-14 NOTE — Patient Instructions (Addendum)
Visit Information   Thank you for taking time to visit with me today. Please don't hesitate to contact me if I can be of assistance to you before our next scheduled telephone appointment.  Following are the goals we discussed today:  Patient Goals/Self-Care Activities:  Patient's son will work with LCSW, on a bi-weekly basis, in an effort to apply for Adult Medicaid, through the [place], [employer], through [employer], [employer], through [employer] of [employer], and [employer], through an [place]. Patient's son will review Adult Medicaid tips and application, complete and sign application, and submit to the [place] for processing. Patient's son will review Personal Care Services information, application and agency provider list, complete application, and submit to patient's Primary Care Physician, Dr. Penni Homans, for review, signature, and fax to [employer] for processing.   Patient's son will select 2 to 3 agencies, from the [place] agency provider list, that he would like to use, and keep this list until your assessment is completed by [employer], and patient has been approved for services. Patient's son will review list of Higher education careers adviser Activities, in [employer] of [facility] brochure, and enroll patient in activities of interest. Patient's son will review list of Adult Day Care Programs, but will continue to consider [place], through [place] [facility]. Patient's son will contact LCSW directly (# [phone]), if he has questions, needs assistance, or if additional social work needs are identified between now and our next scheduled telephone outreach call. Follow-Up:  05/27/2022 at 2:00 pm  Please call the care guide  team at [phone] if you need to cancel or reschedule your appointment.   If you are experiencing a Mental Health or [place] or need someone to talk to, please call the Suicide and Crisis Lifeline: 988 call the [place] National Suicide Prevention Lifeline: (302)090-7248 or TTY: 907-487-5312 TTY (564) 030-8743) to talk to a trained counselor call 1-800-273-TALK (toll free, 24 hour hotline) go to [facility] [place] [phone]) call the [facility]: [phone] call 911   Following is a copy of your full care plan:  Care Plan : LCSW Plan of Care  Updates made by Francis Gaines, LCSW since 05/14/2022 12:00 AM     Problem: Improve My Quality of Life Through [place] and Adult Day Care Program Services.   Priority: High     Goal: Improve My Quality of Life Through [place] and Adult Day Care Program Services.   Start Date: 05/13/2022  Expected End Date: 09/13/2022  This Visit's Progress: On track  Priority: High  Note:   Current Barriers:   Patient with Essential Hypertension, Dementia with Anxiety, Osteoarthritis of Left Knee, and Unsteady Gait, needs Support, Education, Resources, Referrals, Advocacy, and Care Coordination, to resolve unmet personal care needs in the home. Patient is unable to self-administer medications as prescribed. Patient is unable to consistently perform ADL's/IADL's independently. Patient now requires 24 hour care and supervision, which son is trying to provide, as well as work full-time.   Financial constraints related to inability to pay for in-home care services out-of-pocket. Lacks knowledge of available community agencies and resources. Clinical Goals:  Patient's son will work with LCSW, in an effort to apply for Adult Medicaid, through the [employer], Publishing rights manager, through [employer], [employer], through [employer] of [employer], and [employer], through an [place]. Patient will attend all scheduled medical appointments, as evidenced by son report, and care team review of appointment completion in electronic medical record. Patient will demonstrate improved health management independence, as evidenced by Adult Medicaid, [place], Senior Resource Activities, and Adult Day Care Program services in place.   Interventions: Patient's son interviewed and appropriate assessments performed. Collaboration with Primary Care Physician, Dr. Penni Homans regarding development and update of comprehensive plan of care, as evidenced by provider attestation and co-signature. Inter-disciplinary care team collaboration (see longitudinal plan of care). Problem Solving/Task-Centered Solutions Identified. Health Education Utilized. Quality of Sleep Assessed and Sleep Hygiene Techniques Promoted. Caregiver Stress Acknowledged. Consideration of In-Home Care Services Encouraged. Prescription Medication Compliance Reviewed and Emphasized.   Clinical Interventions: Adult Medicaid tips and application, Personal Care Services information, application, and agency provider list, Senior Resources of [facility] brochure, and list of Adult Day Care Programs e-mailed to patient's son ([email or web address]), per his request. Discussed plans with patient's son regarding need for ongoing care management follow-up, and provided direct contact information for care management team. Assisted patient's son with obtaining information about health plan benefits through [facility]. Provided education to patient's son regarding level of care options. Assessed needs, level of care concerns, basic eligibility, and provided education on Adult Medicaid application process, Personal Care Services application process, Senior Resource Activity  referral process, and Adult Day Care Program enrollment process. Identified resources and durable medical equipment needed in the home to improve safety and promote independence. Patient Goals/Self-Care Activities:  Patient's son will work with LCSW, on a bi-weekly basis, in an effort to apply for Adult Medicaid, through the [place], [employer], through [employer], [employer], through [employer] of [employer], and [employer], through an [place]. Patient's son will review Adult Medicaid tips and application, complete and sign application, and submit to the [place] for processing. Patient's son will review Personal Care Services information, application and agency provider list, complete application, and submit to patient's Primary Care Physician, Dr. Penni Homans, for review, signature, and fax to [employer] for processing.   Patient's son will select 2 to 3 agencies, from the [place] agency provider list, that he would like to use, and keep this list until your assessment is completed by [employer], and patient has been approved for services. Patient's son will review list of Higher education careers adviser Activities, in [employer] of [facility] brochure, and enroll patient in activities of interest. Patient's son will review list of Adult Day Care Programs, but will continue to consider [place], through [place] [facility]. Patient's son will contact LCSW directly (# [phone]), if he has questions, needs assistance, or if additional social work needs are identified between now and our next scheduled telephone outreach call. Follow-Up:  05/27/2022 at 2:00 pm     Consent to CCM Services: Ms. Brigham was given information about Chronic Care  Management services including:  CCM service includes personalized support from designated clinical staff supervised by her physician, including individualized plan of care and coordination with other care providers 24/7 contact phone numbers for assistance for urgent and routine care needs. Service will only be billed when office clinical staff spend 20  minutes or more in a month to coordinate care. Only one practitioner may furnish and bill the service in a calendar month. The patient may stop CCM services at any time (effective at the end of the month) by phone call to the office staff. The patient will be responsible for cost sharing (co-pay) of up to 20% of the service fee (after annual deductible is met).  Patient agreed to services and verbal consent obtained.   Patient verbalizes understanding of instructions and care plan provided today and agrees to view in [place]. Active MyChart status and patient understanding of how to access instructions and care plan via MyChart confirmed with patient.     Telephone follow up appointment with care management team member scheduled for:  05/27/2022 at 2:00 pm.  [place] Licensed Clinical Social Worker Kimberly [place] [phone]

## 2022-05-14 NOTE — Chronic Care Management (AMB) (Signed)
Chronic Care Management    Clinical Social Work Note  05/14/2022 Name: Jackie Stevens MRN: [id number] DOB: 01-May-1946  [name] is a 76 y.o. year old female who is a primary care patient of Mosie Lukes, MD. The CCM team was consulted to assist the patient with chronic disease management and/or care coordination needs related to: [employer], Level of Care Concerns, Caregiver Stress, and Financial Difficulties.   Engaged with patient's son by telephone for initial visit in response to provider referral for social work chronic care management and care coordination services.   Consent to Services:  The patient was given information about Chronic Care Management services, agreed to services, and gave verbal consent prior to initiation of services.  Please see initial visit note for detailed documentation.   Patient agreed to services and consent obtained.   Assessment: Review of patient past medical history, allergies, medications, and health status, including review of relevant consultants reports was performed today as part of a comprehensive evaluation and provision of chronic care management and care coordination services.     SDOH (Social Determinants of Health) assessments and interventions performed:  SDOH Interventions    Flowsheet Row Most Recent Value  SDOH Interventions   Food Insecurity Interventions Intervention Not Indicated, Other (Comment)  [Verified by Teryl Lucy Trentman]  Financial Strain Interventions Intervention Not Indicated, Other (Comment)  [Verified by Son, [place] Interventions Intervention Not Indicated, Other (Comment)  [Verified by Teryl Lucy Lydon]  Physical Activity Interventions Intervention Not Indicated, Other (Comments)  [Verified by Teryl Lucy Silberman]  Stress Interventions Intervention Not Indicated, Offered [employer], Provide Counseling, Patient Refused, Other (Comment)  [Verified by Teryl Lucy Mikhail]  Social  Connections Interventions Intervention Not Indicated, Patient Refused, Other (Comment)  [Verified by Christena Flake (Also Refused)]  Transportation Interventions Intervention Not Indicated, Other (Comment)  [Verified by Christena Flake (Also Transports)]        Advanced Directives Status: See Care Plan for related entries.  CCM Care Plan  Allergies  Allergen Reactions   Neomycin-Bacitracin Zn-Polymyx Rash    Polysporin- is tolerated    Niacin Other (See Comments) and Cough    "cough til I threw up" (12/19/2012)   Ciprofloxacin Hives    Got cipro and flagyl at same time, localized hives to IV arm   Flagyl [Metronidazole] Hives    Got cipro and flagyl at same time, localized hives to IV arm    Outpatient Encounter Medications as of 05/13/2022  Medication Sig Note   amLODipine (NORVASC) 2.5 MG tablet Take 1 tablet (2.5 mg total) by mouth daily.    Azelastine HCl 137 MCG/SPRAY SOLN PLACE 2 SPRAYS INTO BOTH NOSTRILS 2 (TWO) TIMES DAILY.    benzonatate (TESSALON) 100 MG capsule TAKE 1-2 CAPSULES BY MOUTH 3 TIMES DAILY AS NEEDED FOR COUGH.    Black Pepper-Turmeric (TURMERIC CURCUMIN) 04-999 MG CAPS 1 tablet daily.    carboxymethylcellulose (REFRESH PLUS) 0.5 % SOLN Place 1 drop into both eyes 3 (three) times daily as needed (dry eyes). 01/01/2021: [employer] Brand    Cholecalciferol-Vitamin C (VITAMIN D3-VITAMIN C) 1000-500 UNIT-MG CAPS daily.    docusate sodium (COLACE) 100 MG capsule Take 100 mg by mouth daily.    donepezil (ARICEPT) 10 MG tablet Take one tab daily    famotidine (PEPCID) 20 MG tablet TAKE 2 TABLETS BY MOUTH EVERY DAY    FLUoxetine (PROZAC) 10 MG tablet TAKE 1 TABLET BY MOUTH EVERY DAY    fluticasone (  FLONASE) 50 MCG/ACT nasal spray PRN (seasonal)    gabapentin (NEURONTIN) 300 MG capsule Take 1 capsule (300 mg total) by mouth at bedtime.    hydrochlorothiazide (HYDRODIURIL) 25 MG tablet TAKE 1 TABLET (25 MG TOTAL) BY MOUTH DAILY.    levothyroxine (SYNTHROID) 112 MCG tablet  Take 1 tablet (112 mcg total) by mouth daily before breakfast.    losartan (COZAAR) 100 MG tablet TAKE 1 TABLET BY MOUTH EVERYDAY AT BEDTIME    metoprolol tartrate (LOPRESSOR) 100 MG tablet TAKE 1&1/2 TABLETS BY MOUTH TWICE A DAY    naproxen (NAPROSYN) 375 MG tablet TAKE 1 TABLET (375 MG TOTAL) BY MOUTH 2 (TWO) TIMES DAILY AS NEEDED FOR MODERATE PAIN OR HEADACHE.    nitroGLYCERIN (NITROSTAT) 0.4 MG SL tablet PLACE 1 TABLET UNDER THE TONGUE EVERY 5 MINUTES AS NEEDED FOR CHEST PAIN 01/19/2021: Not needed.    omeprazole (PRILOSEC) 40 MG capsule TAKE 1 CAPSULE BY MOUTH TWICE A DAY    polyethylene glycol (MIRALAX / GLYCOLAX) 17 g packet Take 17 g by mouth daily.    Psyllium-Calcium (METAMUCIL PLUS CALCIUM) CAPS daily.    rosuvastatin (CRESTOR) 40 MG tablet TAKE 1 TABLET BY MOUTH EVERY DAY    temazepam (RESTORIL) 7.5 MG capsule 1 po every other day for 1 week, then every third day for 1 week. 04/30/2022: Reports takes every third day   No facility-administered encounter medications on file as of 05/13/2022.    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[place]) 02/05/2020   Hyperglycemia 01/17/2020   Vertigo 08/05/2019   Palpitations 05/28/2019   Other fatigue 02/28/2018   Shortness of breath on exertion 02/28/2018   Vitamin D deficiency 02/28/2018   Obesity 09/11/2017   Dermatitis 03/06/2017   Arthritis of left shoulder region 09/30/2016   Pain of joint of left ankle and foot 09/15/2016   Left shoulder pain 09/14/2016   Preventative health care 02/26/2016   Radicular pain of sacrum 02/26/2016   Vitamin B12 deficiency 02/26/2016   Ventral hernia 12/02/2015   Pelvic mass in female 12/02/2015   Colitis 09/14/2015   Paresthesia 03/23/2015   Otitis, externa, infective 03/13/2015   Hyponatremia 03/13/2015   Cystitis  09/10/2014   Headache 08/21/2014   Low back pain 06/09/2014   Otitis media 01/07/2014   Anemia 10/06/2013   Medicare annual wellness visit, subsequent 10/06/2013   Asthma, mild intermittent 04/02/2013   Internal hemorrhoid, bleeding 01/23/2013   Diverticulitis of rectosigmoid 11/28/2012   Colonic diverticular abscess 11/21/2012   Abnormal cervical cytology 10/25/2012   Cancer ([place])    Insomnia    Hypothyroidism    Constipation    Cough 09/02/2012   Chest pain 08/05/2011   Hyperlipidemia, mixed 11/08/2010   Essential hypertension 11/08/2010   GERD (gastroesophageal reflux disease) 11/08/2010   Obstructive sleep apnea 11/04/2010   Allergic rhinitis 11/04/2010    Conditions to be addressed/monitored: Anxiety and Dementia.  Film/video editor, Limited Social Support, Level of Care Concerns, ADL/IADL Limitations, Mental Health Concerns, Social Isolation, Limited Access to Caregiver, Cognitive Deficits, Memory Deficits, and Lacks Knowledge of [employer].  Care Plan : LCSW Plan of Care  Updates made by Francis Gaines, LCSW since 05/14/2022 12:00 AM     Problem: Improve My Quality of Life Through [place] and Adult Day Care Program Services.  Priority: High     Goal: Improve My Quality of Life Through [place] and Adult Day Care Program Services.   Start Date: 05/13/2022  Expected End Date: 09/13/2022  This Visit's Progress: On track  Priority: High  Note:   Current Barriers:   Patient with Essential Hypertension, Dementia with Anxiety, Osteoarthritis of Left Knee, and Unsteady Gait, needs Support, Education, Resources, Referrals, Advocacy, and Care Coordination, to resolve unmet personal care needs in the home. Patient is unable to self-administer medications as prescribed. Patient is unable to consistently perform ADL's/IADL's independently. Patient now requires 24 hour care and supervision, which son is trying to provide, as well as work  full-time.   Financial constraints related to inability to pay for in-home care services out-of-pocket. Lacks knowledge of available community agencies and resources. Clinical Goals:  Patient's son will work with LCSW, in an effort to apply for Adult Medicaid, through the [place], [employer], through [employer], [employer], through [employer] of [employer], and [employer], through an [place]. Patient will attend all scheduled medical appointments, as evidenced by son report, and care team review of appointment completion in electronic medical record. Patient will demonstrate improved health management independence, as evidenced by Adult Medicaid, [place], Senior Resource Activities, and Adult Day Care Program services in place.   Interventions: Patient's son interviewed and appropriate assessments performed. Collaboration with Primary Care Physician, Dr. Penni Homans regarding development and update of comprehensive plan of care, as evidenced by provider attestation and co-signature. Inter-disciplinary care team collaboration (see longitudinal plan of care). Problem Solving/Task-Centered Solutions Identified. Health Education Utilized. Quality of Sleep Assessed and Sleep Hygiene Techniques Promoted. Caregiver Stress Acknowledged. Consideration of In-Home Care Services Encouraged. Prescription Medication Compliance Reviewed and Emphasized.   Clinical Interventions: Adult Medicaid tips and application, Personal Care Services information, application, and agency provider list, Senior Resources of [facility] brochure, and list of Adult Day Care Programs e-mailed to patient's son ([email or web address]), per his request. Discussed plans with patient's son regarding need for ongoing care management follow-up, and provided direct contact information for care  management team. Assisted patient's son with obtaining information about health plan benefits through [facility]. Provided education to patient's son regarding level of care options. Assessed needs, level of care concerns, basic eligibility, and provided education on Adult Medicaid application process, Personal Care Services application process, Senior Resource Activity referral process, and Adult Day Care Program enrollment process. Identified resources and durable medical equipment needed in the home to improve safety and promote independence. Patient Goals/Self-Care Activities:  Patient's son will work with LCSW, on a bi-weekly basis, in an effort to apply for Adult Medicaid, through the [place], [employer], through [employer], [employer], through [employer], and [employer], through an [place]. Patient's son will review Adult Medicaid tips and application, complete and sign application, and submit to the [place] for processing. Patient's son will review Personal Care Services information, application and agency provider list, complete application, and submit to patient's Primary Care Physician, Dr. Penni Homans, for review, signature, and fax to [employer] for processing.   Patient's son will select 2 to 3 agencies, from the [place] agency provider list, that he would like to use, and keep this list until your assessment is completed by [employer],  and patient has been approved for services. Patient's son will review list of Higher education careers adviser Activities, in [employer] of [facility] brochure, and enroll patient in activities of interest. Patient's son will review list of Adult Day Care Programs, but will continue to consider [place], through  [place] Adult [facility]. Patient's son will contact LCSW directly (# [phone]), if he has questions, needs assistance, or if additional social work needs are identified between now and our next scheduled telephone outreach call. Follow-Up:  05/27/2022 at 2:00 pm   Nat Christen Clif[place] Clinical Social Worker [place] [phone]

## 2022-05-14 NOTE — Telephone Encounter (Signed)
Noted. Will contact pt's son when paperwork is ready.

## 2022-05-14 NOTE — Telephone Encounter (Signed)
Pt's son came in the office to drop off paperwork for [facility]. Pt's son did not appear to be on dpr but requested a call when this is ready to pick up. Please advise. Merry Proud [phone]

## 2022-05-17 ENCOUNTER — Encounter: Payer: Self-pay | Admitting: *Deleted

## 2022-05-17 DIAGNOSIS — G4700 Insomnia, unspecified: Secondary | ICD-10-CM | POA: Diagnosis not present

## 2022-05-17 DIAGNOSIS — J309 Allergic rhinitis, unspecified: Secondary | ICD-10-CM | POA: Diagnosis not present

## 2022-05-17 DIAGNOSIS — I10 Essential (primary) hypertension: Secondary | ICD-10-CM | POA: Diagnosis not present

## 2022-05-17 DIAGNOSIS — H6690 Otitis media, unspecified, unspecified ear: Secondary | ICD-10-CM | POA: Diagnosis not present

## 2022-05-17 DIAGNOSIS — M19012 Primary osteoarthritis, left shoulder: Secondary | ICD-10-CM | POA: Diagnosis not present

## 2022-05-17 DIAGNOSIS — G4733 Obstructive sleep apnea (adult) (pediatric): Secondary | ICD-10-CM | POA: Diagnosis not present

## 2022-05-17 DIAGNOSIS — H9319 Tinnitus, unspecified ear: Secondary | ICD-10-CM | POA: Diagnosis not present

## 2022-05-17 DIAGNOSIS — K439 Ventral hernia without obstruction or gangrene: Secondary | ICD-10-CM | POA: Diagnosis not present

## 2022-05-17 DIAGNOSIS — H60399 Other infective otitis externa, unspecified ear: Secondary | ICD-10-CM | POA: Diagnosis not present

## 2022-05-17 DIAGNOSIS — K648 Other hemorrhoids: Secondary | ICD-10-CM | POA: Diagnosis not present

## 2022-05-17 DIAGNOSIS — R69 Illness, unspecified: Secondary | ICD-10-CM | POA: Diagnosis not present

## 2022-05-17 DIAGNOSIS — E559 Vitamin D deficiency, unspecified: Secondary | ICD-10-CM | POA: Diagnosis not present

## 2022-05-17 DIAGNOSIS — M069 Rheumatoid arthritis, unspecified: Secondary | ICD-10-CM | POA: Diagnosis not present

## 2022-05-17 DIAGNOSIS — D649 Anemia, unspecified: Secondary | ICD-10-CM | POA: Diagnosis not present

## 2022-05-17 DIAGNOSIS — E782 Mixed hyperlipidemia: Secondary | ICD-10-CM | POA: Diagnosis not present

## 2022-05-17 DIAGNOSIS — E538 Deficiency of other specified B group vitamins: Secondary | ICD-10-CM | POA: Diagnosis not present

## 2022-05-17 DIAGNOSIS — K219 Gastro-esophageal reflux disease without esophagitis: Secondary | ICD-10-CM | POA: Diagnosis not present

## 2022-05-17 DIAGNOSIS — K5900 Constipation, unspecified: Secondary | ICD-10-CM | POA: Diagnosis not present

## 2022-05-17 DIAGNOSIS — J4520 Mild intermittent asthma, uncomplicated: Secondary | ICD-10-CM | POA: Diagnosis not present

## 2022-05-17 DIAGNOSIS — G3184 Mild cognitive impairment, so stated: Secondary | ICD-10-CM | POA: Diagnosis not present

## 2022-05-17 DIAGNOSIS — M19041 Primary osteoarthritis, right hand: Secondary | ICD-10-CM | POA: Diagnosis not present

## 2022-05-17 NOTE — Progress Notes (Unsigned)
Subjective:    Patient ID: Jackie Stevens, female    DOB: 1946/11/11, 76 y.o.   MRN: [id number]  No chief complaint on file.   HPI Patient is in today for a follow up.  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  Loss of glenohumeral joint space.  Well-maintained subacromial space.  Injected 09/30/2016 [name] .   Asthma, mild intermittent 04/02/2013   Chicken pox as a child   Colonic diverticular abscess 11/21/2012   Constipation    Decreased hearing    Dermatitis 03/06/2017   Diverticulitis 10/25/2012   pt. reports that a drain was placed - 09/2012     Diverticulitis of rectosigmoid 11/28/2012   Dry mouth    Essential hypertension 11/08/2010   Qualifier: Diagnosis of  By: Annamaria Boots MD, Clinton D    Excessive thirst    External hemorrhoid, bleeding    "sometimes" (2013/01/07)   Frequent urination    GERD (gastroesophageal reflux disease)    H/O hiatal hernia    Hand tingling    Heart murmur    History of kidney stones    Hyperglycemia 01/17/2020   Hyperlipidemia    Hypothyroidism    Incontinence    Insomnia    Kidney stones 1970's   "passed on their own" (01-07-13)   Low back pain 06/09/2014   Measles as a child   Mild neurocognitive disorder 02/05/2020   Obesity 09/11/2017   Obstructive sleep apnea 11/04/2010   NPSG [facility] 07/15/10- AHI 13.5/ hr CPAP 10/APS     Otitis media 01/07/2014   Palpitations    Paresthesia 03/23/2015   Left face   PONV (postoperative nausea and  vomiting)    Rheumatoid arteritis    Shortness of breath dyspnea    using stairs   Sinus pain    Swelling of both lower extremities    Thyroid cancer 1980's   Tinnitus    UTI (urinary tract infection) 04/02/2013   Vertigo 08/05/2019   Vitamin D deficiency 02/28/2018    Past Surgical History:  Procedure Laterality Date   CHOLECYSTECTOMY  1990   COLON SURGERY     COLOSTOMY REVISION  01/07/2013   Procedure: COLON RESECTION SIGMOID;  Surgeon: Gwenyth Ober, MD;  Location: [facility];  Service: General;  Laterality: N/A;   CYSTOSCOPY WITH STENT PLACEMENT  01/07/13   Procedure: CYSTOSCOPY WITH STENT PLACEMENT;  Surgeon: Hanley Ben, MD;  Location: [facility];  Service: Urology;  Laterality: N/A;   DILATION AND CURETTAGE OF UTERUS  1960's   "lots of them; had miscarriages" (2013-01-07)   LYSIS OF ADHESION N/A 12/02/2015   Procedure: LAPAROSCOPIC LYSIS OF ADHESION;  Surgeon: Johnathan Hausen, MD;  Location: [facility];  Service: General;  Laterality: N/A;   ROBOTIC ASSISTED BILATERAL SALPINGO OOPHERECTOMY Bilateral 12/02/2015   Procedure: XI ROBOTIC ASSISTED BILATERAL SALPINGO OOPHORECTOMY;  Surgeon: Everitt Amber, MD;  Location: [facility];  Service: Gynecology;  Laterality: Bilateral;   SIGMOID RESECTION /  RECTOPEXY  12/19/2012   THYROIDECTOMY, PARTIAL  1988   "then did iodine to remove the rest" (12/19/2012)   TONSILLECTOMY  1951?   TOTAL KNEE ARTHROPLASTY Left 01/19/2021   Procedure: TOTAL KNEE ARTHROPLASTY;  Surgeon: Gaynelle Arabian, MD;  Location: [facility];  Service: Orthopedics;  Laterality: Left;  61mn   TRANSRECTAL DRAINAGE OF PELVIC ABSCESS  10/27/2012   VAGINAL HYSTERECTOMY  1970's   "still have my ovaries" (12/19/2012)    Family History  Problem Relation Age of Onset   Heart disease Father    Pneumonia Father    Hypertension Father    Hyperlipidemia Father    Cancer Father        skin   Stroke Father    Alzheimer's disease Mother    Heart disease Mother    Depression Mother    Emphysema Brother         marijuana and cigarettes   Alcohol abuse Brother    Hearing loss Brother    Diabetes Maternal Grandmother    Alzheimer's disease Paternal Grandmother    Cancer Paternal Grandmother        lung?- smoker   Hyperlipidemia Paternal Grandmother    Heart attack Paternal Grandfather    Alcohol abuse Paternal Grandfather    Neurofibromatosis Son        schwanomatosis   Neurofibromatosis Son        swanomatosis   Cancer Paternal Aunt     Social History   Socioeconomic History   Marital status: Widowed    Spouse name: WChenee Munns  Number of children: 2   Years of education: 12   Highest education level: High school graduate  Occupational History   Occupation: Retired  Tobacco Use   Smoking status: Never    Passive exposure: Never   Smokeless tobacco: Never   Tobacco comments:    Verified by SChristena Flake Vaping Use   Vaping Use: Never used  Substance and Sexual Activity   Alcohol use: No    Alcohol/week: 0.0 standard drinks   Drug use: No   Sexual activity: Not Currently  Other Topics Concern   Not on file  Social History Narrative   Married - (husband passed )   Children   Right handed   Some college   Social Determinants of Health   Financial Resource Strain: Low Risk    Difficulty of Paying Living Expenses: Not very hard  Food Insecurity: No Food Insecurity   Worried About RCharity fundraiserin the Last Year: Never true   RTomalesin the Last Year: Never true  Transportation Needs: No Transportation Needs   Lack of Transportation (Medical): No   Lack of Transportation (Non-Medical): No  Physical Activity: Inactive   Days of Exercise per Week: 0 days   Minutes of Exercise per Session: 0 min  Stress: No Stress Concern Present   Feeling of Stress : Only a little  Social Connections: Moderately Isolated   Frequency of Communication with Friends and Family: More than three times a week   Frequency of Social Gatherings with Friends and Family: More  than three times a week   Attends Religious Services: 1 to 4 times per year   Active Member of [employer] Organizations: No   Attends CArchivistMeetings: Never   Marital Status: Widowed  Intimate Partner Violence: Not At Risk   Fear of Current or Ex-Partner: No   Emotionally Abused: No   Physically Abused:  No   Sexually Abused: No    Outpatient Medications Prior to Visit  Medication Sig Dispense Refill   amLODipine (NORVASC) 2.5 MG tablet Take 1 tablet (2.5 mg total) by mouth daily. 30 tablet 2   Azelastine HCl 137 MCG/SPRAY SOLN PLACE 2 SPRAYS INTO BOTH NOSTRILS 2 (TWO) TIMES DAILY. 90 mL 4   benzonatate (TESSALON) 100 MG capsule TAKE 1-2 CAPSULES BY MOUTH 3 TIMES DAILY AS NEEDED FOR COUGH. 60 capsule 0   Black Pepper-Turmeric (TURMERIC CURCUMIN) 04-999 MG CAPS 1 tablet daily.     carboxymethylcellulose (REFRESH PLUS) 0.5 % SOLN Place 1 drop into both eyes 3 (three) times daily as needed (dry eyes).     Cholecalciferol-Vitamin C (VITAMIN D3-VITAMIN C) 1000-500 UNIT-MG CAPS daily.     docusate sodium (COLACE) 100 MG capsule Take 100 mg by mouth daily.     donepezil (ARICEPT) 10 MG tablet Take one tab daily 30 tablet 11   famotidine (PEPCID) 20 MG tablet TAKE 2 TABLETS BY MOUTH EVERY DAY 180 tablet 1   FLUoxetine (PROZAC) 10 MG tablet TAKE 1 TABLET BY MOUTH EVERY DAY 30 tablet 3   fluticasone (FLONASE) 50 MCG/ACT nasal spray PRN (seasonal)     gabapentin (NEURONTIN) 300 MG capsule Take 1 capsule (300 mg total) by mouth at bedtime. 30 capsule 1   hydrochlorothiazide (HYDRODIURIL) 25 MG tablet TAKE 1 TABLET (25 MG TOTAL) BY MOUTH DAILY. 90 tablet 1   levothyroxine (SYNTHROID) 112 MCG tablet Take 1 tablet (112 mcg total) by mouth daily before breakfast. 90 tablet 1   losartan (COZAAR) 100 MG tablet TAKE 1 TABLET BY MOUTH EVERYDAY AT BEDTIME 90 tablet 1   metoprolol tartrate (LOPRESSOR) 100 MG tablet TAKE 1&1/2 TABLETS BY MOUTH TWICE A DAY 270 tablet 1   naproxen (NAPROSYN) 375 MG  tablet TAKE 1 TABLET (375 MG TOTAL) BY MOUTH 2 (TWO) TIMES DAILY AS NEEDED FOR MODERATE PAIN OR HEADACHE. 60 tablet 3   nitroGLYCERIN (NITROSTAT) 0.4 MG SL tablet PLACE 1 TABLET UNDER THE TONGUE EVERY 5 MINUTES AS NEEDED FOR CHEST PAIN 25 tablet 1   omeprazole (PRILOSEC) 40 MG capsule TAKE 1 CAPSULE BY MOUTH TWICE A DAY 180 capsule 1   polyethylene glycol (MIRALAX / GLYCOLAX) 17 g packet Take 17 g by mouth daily.     Psyllium-Calcium (METAMUCIL PLUS CALCIUM) CAPS daily.     rosuvastatin (CRESTOR) 40 MG tablet TAKE 1 TABLET BY MOUTH EVERY DAY 90 tablet 1   temazepam (RESTORIL) 7.5 MG capsule 1 po every other day for 1 week, then every third day for 1 week. 7 capsule 0   No facility-administered medications prior to visit.    Allergies  Allergen Reactions   Neomycin-Bacitracin Zn-Polymyx Rash    Polysporin- is tolerated    Niacin Other (See Comments) and Cough    "cough til I threw up" (12/19/2012)   Ciprofloxacin Hives    Got cipro and flagyl at same time, localized hives to IV arm   Flagyl [Metronidazole] Hives    Got cipro and flagyl at same time, localized hives to IV arm    ROS     Objective:    Physical Exam  There were no vitals taken for this visit. Wt Readings from Last 3 Encounters:  05/03/22 175 lb (79.4 kg)  04/20/22 180 lb 6.4 oz (81.8 kg)  01/18/22 193 lb 6.4 oz (87.7 kg)    Diabetic Foot Exam - Simple   No data filed    Lab Results  Component Value Date   WBC 8.8 01/18/2022   HGB 11.9 (L) 01/18/2022   HCT 36.8 01/18/2022   PLT 305.0 01/18/2022   GLUCOSE 96 04/19/2022   CHOL 107 04/19/2022   TRIG 102.0 04/19/2022   HDL 51.50 04/19/2022   LDLDIRECT 81.0 02/12/2020   LDLCALC 35 04/19/2022   ALT 18 04/19/2022   AST 16 04/19/2022   NA 131 (L) 04/19/2022   K 4.3 04/19/2022   CL 92 (L) 04/19/2022   CREATININE 0.65 04/19/2022   BUN 15 04/19/2022   CO2 30 04/19/2022   TSH <0.01 (L) 04/19/2022   INR 1.0 01/12/2021   HGBA1C 5.9 04/19/2022    Lab  Results  Component Value Date   TSH <0.01 (L) 04/19/2022   Lab Results  Component Value Date   WBC 8.8 01/18/2022   HGB 11.9 (L) 01/18/2022   HCT 36.8 01/18/2022   MCV 85.9 01/18/2022   PLT 305.0 01/18/2022   Lab Results  Component Value Date   NA 131 (L) 04/19/2022   K 4.3 04/19/2022   CO2 30 04/19/2022   GLUCOSE 96 04/19/2022   BUN 15 04/19/2022   CREATININE 0.65 04/19/2022   BILITOT 0.5 04/19/2022   ALKPHOS 67 04/19/2022   AST 16 04/19/2022   ALT 18 04/19/2022   PROT 6.1 04/19/2022   ALBUMIN 4.0 04/19/2022   CALCIUM 8.8 04/19/2022   ANIONGAP 8 01/20/2021   GFR 84.76 04/19/2022   Lab Results  Component Value Date   CHOL 107 04/19/2022   Lab Results  Component Value Date   HDL 51.50 04/19/2022   Lab Results  Component Value Date   LDLCALC 35 04/19/2022   Lab Results  Component Value Date   TRIG 102.0 04/19/2022   Lab Results  Component Value Date   CHOLHDL 2 04/19/2022   Lab Results  Component Value Date   HGBA1C 5.9 04/19/2022       Assessment & Plan:   Problem List Items Addressed This Visit   None   I am having Dartha P. Higham maintain her nitroGLYCERIN, polyethylene glycol, docusate sodium, carboxymethylcellulose, Turmeric Curcumin, Vitamin D3-Vitamin C, fluticasone, Metamucil Plus Calcium, Azelastine HCl, temazepam, gabapentin, losartan, famotidine, hydrochlorothiazide, omeprazole, FLUoxetine, naproxen, metoprolol tartrate, amLODipine, levothyroxine, donepezil, rosuvastatin, and benzonatate.  No orders of the defined types were placed in this encounter.

## 2022-05-17 NOTE — Telephone Encounter (Signed)
Forms completed and faxed. Pt's son Merry Proud notified. Pt has appointment tomorrow, will give pt's son original forms at appointment.

## 2022-05-18 ENCOUNTER — Encounter: Payer: Self-pay | Admitting: Family Medicine

## 2022-05-18 ENCOUNTER — Ambulatory Visit (INDEPENDENT_AMBULATORY_CARE_PROVIDER_SITE_OTHER): Payer: Medicare HMO | Admitting: Family Medicine

## 2022-05-18 ENCOUNTER — Ambulatory Visit (HOSPITAL_BASED_OUTPATIENT_CLINIC_OR_DEPARTMENT_OTHER)
Admission: RE | Admit: 2022-05-18 | Discharge: 2022-05-18 | Disposition: A | Discharge status: Home / Self Care | Payer: Medicare HMO | Source: Ambulatory Visit | Attending: Family Medicine | Admitting: Family Medicine

## 2022-05-18 VITALS — BP 138/86 | HR 48 | Resp 20 | Ht 66.0 in | Wt 176.6 lb

## 2022-05-18 DIAGNOSIS — D649 Anemia, unspecified: Secondary | ICD-10-CM | POA: Diagnosis not present

## 2022-05-18 DIAGNOSIS — M19012 Primary osteoarthritis, left shoulder: Secondary | ICD-10-CM | POA: Diagnosis not present

## 2022-05-18 DIAGNOSIS — J4520 Mild intermittent asthma, uncomplicated: Secondary | ICD-10-CM

## 2022-05-18 DIAGNOSIS — H6690 Otitis media, unspecified, unspecified ear: Secondary | ICD-10-CM | POA: Diagnosis not present

## 2022-05-18 DIAGNOSIS — F0394 Unspecified dementia, unspecified severity, with anxiety: Secondary | ICD-10-CM | POA: Diagnosis not present

## 2022-05-18 DIAGNOSIS — E782 Mixed hyperlipidemia: Secondary | ICD-10-CM | POA: Diagnosis not present

## 2022-05-18 DIAGNOSIS — M25512 Pain in left shoulder: Secondary | ICD-10-CM | POA: Insufficient documentation

## 2022-05-18 DIAGNOSIS — E038 Other specified hypothyroidism: Secondary | ICD-10-CM | POA: Diagnosis not present

## 2022-05-18 DIAGNOSIS — G4700 Insomnia, unspecified: Secondary | ICD-10-CM | POA: Diagnosis not present

## 2022-05-18 DIAGNOSIS — M25552 Pain in left hip: Secondary | ICD-10-CM | POA: Diagnosis not present

## 2022-05-18 DIAGNOSIS — R5383 Other fatigue: Secondary | ICD-10-CM

## 2022-05-18 DIAGNOSIS — E538 Deficiency of other specified B group vitamins: Secondary | ICD-10-CM

## 2022-05-18 DIAGNOSIS — G3184 Mild cognitive impairment, so stated: Secondary | ICD-10-CM | POA: Diagnosis not present

## 2022-05-18 DIAGNOSIS — J309 Allergic rhinitis, unspecified: Secondary | ICD-10-CM | POA: Diagnosis not present

## 2022-05-18 DIAGNOSIS — I10 Essential (primary) hypertension: Secondary | ICD-10-CM

## 2022-05-18 DIAGNOSIS — R739 Hyperglycemia, unspecified: Secondary | ICD-10-CM | POA: Diagnosis not present

## 2022-05-18 DIAGNOSIS — M19041 Primary osteoarthritis, right hand: Secondary | ICD-10-CM | POA: Diagnosis not present

## 2022-05-18 DIAGNOSIS — H60399 Other infective otitis externa, unspecified ear: Secondary | ICD-10-CM | POA: Diagnosis not present

## 2022-05-18 DIAGNOSIS — E559 Vitamin D deficiency, unspecified: Secondary | ICD-10-CM | POA: Diagnosis not present

## 2022-05-18 DIAGNOSIS — G4733 Obstructive sleep apnea (adult) (pediatric): Secondary | ICD-10-CM | POA: Diagnosis not present

## 2022-05-18 DIAGNOSIS — K648 Other hemorrhoids: Secondary | ICD-10-CM | POA: Diagnosis not present

## 2022-05-18 DIAGNOSIS — K5900 Constipation, unspecified: Secondary | ICD-10-CM | POA: Diagnosis not present

## 2022-05-18 DIAGNOSIS — M069 Rheumatoid arthritis, unspecified: Secondary | ICD-10-CM | POA: Diagnosis not present

## 2022-05-18 DIAGNOSIS — K219 Gastro-esophageal reflux disease without esophagitis: Secondary | ICD-10-CM | POA: Diagnosis not present

## 2022-05-18 DIAGNOSIS — H9319 Tinnitus, unspecified ear: Secondary | ICD-10-CM | POA: Diagnosis not present

## 2022-05-18 DIAGNOSIS — K439 Ventral hernia without obstruction or gangrene: Secondary | ICD-10-CM | POA: Diagnosis not present

## 2022-05-18 DIAGNOSIS — R69 Illness, unspecified: Secondary | ICD-10-CM | POA: Diagnosis not present

## 2022-05-18 NOTE — Assessment & Plan Note (Signed)
Well controlled, no changes to meds. Encouraged heart healthy diet such as the DASH diet and exercise as tolerated.  °

## 2022-05-18 NOTE — Patient Instructions (Signed)
Arthritis ?Arthritis means joint pain. It can also mean joint disease. A joint is a place where bones come together. There are more than 100 types of arthritis. ?What are the causes? ?Wear and tear of a joint. This is the most common cause. ?Too much of a chemical called uric acid in the blood, which leads to pain in the joint (gout). ?Pain and swelling (inflammation) in a joint. ?Infection of a joint. ?Injuries in the joint. ?A reaction to medicines (allergy). ?In some cases, the cause may not be known. ?What are the signs or symptoms? ?Pain in a joint when moving. ?Redness at a joint. ?Swelling at a joint. ?Stiffness at a joint. ?Warmth coming from the joint. ?A fever. ?A feeling of being sick. ?How is this treated? ?This condition may be treated with: ?Treating the cause, if it is known. ?Rest. ?Raising (elevating) the joint. ?Putting cold or hot packs on the joint. ?Medicines to treat symptoms and reduce pain and swelling. ?Shots (injections) of medicines, such as cortisone, into the joint. ?You may also be told to make changes in your life, such as doing exercises and losing weight. ?Follow these instructions at home: ?Medicines ?Take over-the-counter and prescription medicines only as told by your doctor. ?Do not take aspirin for pain if your doctor says that you may have gout. ?Activity ?Rest your joint if your doctor tells you to. ?Avoid activities that make the pain worse. ?Exercise your joint regularly as told by your doctor. Try doing exercises like: ?Swimming. ?Water aerobics. ?Biking. ?Walking. ?Managing pain, stiffness, and swelling ? ?  ? ?If told, put ice on the affected area. To do this: ?Put ice in a plastic bag. ?Place a towel between your skin and the bag. ?Leave the ice on for 20 minutes, 2-3 times a day. ?Take off the ice if your skin turns bright red. This is very important. If you cannot feel pain, heat, or cold, you have a greater risk of damage to the area. ?If your joint is swollen, raise  (elevate) it above the level of your heart if told by your doctor. ?If your joint feels stiff in the morning, try taking a warm shower. ?If told, put heat on the affected area. Do this as often as told by your doctor. Use the heat source that your doctor recommends, such as a moist heat pack or a heating pad. If you have diabetes, do not apply heat without asking your doctor. To apply heat: ?Place a towel between your skin and the heat source. ?Leave the heat on for 20-30 minutes. ?Take off the heat if your skin turns bright red. This is very important. If you cannot feel pain, heat, or cold, you have a greater risk of getting burned. ?General instructions ?Maintain a healthy weight. Follow instructions from your doctor for weight control. ?Do not smoke or use any products that contain nicotine or tobacco. If you need help quitting, ask your doctor. ?Keep all follow-up visits. ?Where to find more information ?National Institutes of Health: www.niams.nih.gov ?Contact a doctor if: ?The pain gets worse. ?You have a fever. ?Get help right away if: ?You have very bad pain in your joint. ?You have swelling in your joint. ?Your joint is red. ?Many joints become painful and swollen. ?You have very bad back pain. ?Your leg is very weak. ?Summary ?Arthritis means joint pain. It can also mean joint disease. A joint is a place where bones come together. ?The most common cause of this condition is wear and   tear of a joint. ?Symptoms of this condition include redness, swelling, or stiffness of the joint. ?This condition is treated with rest, raising the joint, medicines, and putting cold or hot packs on the joint. ?Follow your doctor's instructions about medicines, activity, exercises, and other home care treatments. ?This information is not intended to replace advice given to you by your health care provider. Make sure you discuss any questions you have with your health care provider. ?Document Revised: 09/08/2021 Document  Reviewed: 09/08/2021 ?Elsevier Patient Education ? 2023 Elsevier Inc. ? ?

## 2022-05-18 NOTE — Assessment & Plan Note (Signed)
Xray 2022 shows advanced arthritic changes she is referred to ortho for consideration

## 2022-05-18 NOTE — Assessment & Plan Note (Signed)
Doing well with help of son and neurology. She is working on a day treatment plan with [facility] to keep her active

## 2022-05-18 NOTE — Assessment & Plan Note (Signed)
Has been forgetting to use her CPAP lately and feels more tired she and her son have agreed he will start helping her to remember and place the mask

## 2022-05-18 NOTE — Assessment & Plan Note (Signed)
Xray ordered today. Referred to ortho for evaluation. Encouraged moist heat vs ice and gentle stretching as tolerated. May try Tylenol and prescription meds as directed and report if symptoms worsen or seek immediate care.

## 2022-05-18 NOTE — Assessment & Plan Note (Signed)
Tolerating statin, encouraged heart healthy diet, avoid trans fats, minimize simple carbs and saturated fats. Increase exercise as tolerated 

## 2022-05-18 NOTE — Assessment & Plan Note (Signed)
Supplement and monitor 

## 2022-05-18 NOTE — Assessment & Plan Note (Signed)
Likely related to not using the CPAP

## 2022-05-18 NOTE — Assessment & Plan Note (Signed)
On Levothyroxine, continue to monitor 

## 2022-05-18 NOTE — Assessment & Plan Note (Signed)
hgba1c acceptable, minimize simple carbs. Increase exercise as tolerated.  

## 2022-05-24 DIAGNOSIS — J4520 Mild intermittent asthma, uncomplicated: Secondary | ICD-10-CM | POA: Diagnosis not present

## 2022-05-24 DIAGNOSIS — H6690 Otitis media, unspecified, unspecified ear: Secondary | ICD-10-CM | POA: Diagnosis not present

## 2022-05-24 DIAGNOSIS — M19012 Primary osteoarthritis, left shoulder: Secondary | ICD-10-CM | POA: Diagnosis not present

## 2022-05-24 DIAGNOSIS — D649 Anemia, unspecified: Secondary | ICD-10-CM | POA: Diagnosis not present

## 2022-05-24 DIAGNOSIS — G4700 Insomnia, unspecified: Secondary | ICD-10-CM | POA: Diagnosis not present

## 2022-05-24 DIAGNOSIS — G3184 Mild cognitive impairment, so stated: Secondary | ICD-10-CM | POA: Diagnosis not present

## 2022-05-24 DIAGNOSIS — K439 Ventral hernia without obstruction or gangrene: Secondary | ICD-10-CM | POA: Diagnosis not present

## 2022-05-24 DIAGNOSIS — G4733 Obstructive sleep apnea (adult) (pediatric): Secondary | ICD-10-CM | POA: Diagnosis not present

## 2022-05-24 DIAGNOSIS — E559 Vitamin D deficiency, unspecified: Secondary | ICD-10-CM | POA: Diagnosis not present

## 2022-05-24 DIAGNOSIS — J309 Allergic rhinitis, unspecified: Secondary | ICD-10-CM | POA: Diagnosis not present

## 2022-05-24 DIAGNOSIS — K648 Other hemorrhoids: Secondary | ICD-10-CM | POA: Diagnosis not present

## 2022-05-24 DIAGNOSIS — M19041 Primary osteoarthritis, right hand: Secondary | ICD-10-CM | POA: Diagnosis not present

## 2022-05-24 DIAGNOSIS — K219 Gastro-esophageal reflux disease without esophagitis: Secondary | ICD-10-CM | POA: Diagnosis not present

## 2022-05-24 DIAGNOSIS — I10 Essential (primary) hypertension: Secondary | ICD-10-CM | POA: Diagnosis not present

## 2022-05-24 DIAGNOSIS — E538 Deficiency of other specified B group vitamins: Secondary | ICD-10-CM | POA: Diagnosis not present

## 2022-05-24 DIAGNOSIS — H9319 Tinnitus, unspecified ear: Secondary | ICD-10-CM | POA: Diagnosis not present

## 2022-05-24 DIAGNOSIS — H60399 Other infective otitis externa, unspecified ear: Secondary | ICD-10-CM | POA: Diagnosis not present

## 2022-05-24 DIAGNOSIS — R69 Illness, unspecified: Secondary | ICD-10-CM | POA: Diagnosis not present

## 2022-05-24 DIAGNOSIS — E782 Mixed hyperlipidemia: Secondary | ICD-10-CM | POA: Diagnosis not present

## 2022-05-24 DIAGNOSIS — K5900 Constipation, unspecified: Secondary | ICD-10-CM | POA: Diagnosis not present

## 2022-05-24 DIAGNOSIS — M069 Rheumatoid arthritis, unspecified: Secondary | ICD-10-CM | POA: Diagnosis not present

## 2022-05-25 ENCOUNTER — Telehealth: Payer: Self-pay | Admitting: Family Medicine

## 2022-05-25 NOTE — Telephone Encounter (Signed)
Fax confirmation received. 

## 2022-05-25 NOTE — Telephone Encounter (Signed)
Immunization record sent.

## 2022-05-25 NOTE — Telephone Encounter (Signed)
Elmyra Ricks ([employer]) called requesting COVID-19 vaccination records for the pt in order to begin the program she is enrolled in. They will be faxing over an ROI form that has been signed by pt's son to release medical info to them.

## 2022-05-26 DIAGNOSIS — R69 Illness, unspecified: Secondary | ICD-10-CM | POA: Diagnosis not present

## 2022-05-26 DIAGNOSIS — J4520 Mild intermittent asthma, uncomplicated: Secondary | ICD-10-CM | POA: Diagnosis not present

## 2022-05-26 DIAGNOSIS — K219 Gastro-esophageal reflux disease without esophagitis: Secondary | ICD-10-CM | POA: Diagnosis not present

## 2022-05-26 DIAGNOSIS — E782 Mixed hyperlipidemia: Secondary | ICD-10-CM | POA: Diagnosis not present

## 2022-05-26 DIAGNOSIS — K5900 Constipation, unspecified: Secondary | ICD-10-CM | POA: Diagnosis not present

## 2022-05-26 DIAGNOSIS — D649 Anemia, unspecified: Secondary | ICD-10-CM | POA: Diagnosis not present

## 2022-05-26 DIAGNOSIS — G4733 Obstructive sleep apnea (adult) (pediatric): Secondary | ICD-10-CM | POA: Diagnosis not present

## 2022-05-26 DIAGNOSIS — K439 Ventral hernia without obstruction or gangrene: Secondary | ICD-10-CM | POA: Diagnosis not present

## 2022-05-26 DIAGNOSIS — H60399 Other infective otitis externa, unspecified ear: Secondary | ICD-10-CM | POA: Diagnosis not present

## 2022-05-26 DIAGNOSIS — G4700 Insomnia, unspecified: Secondary | ICD-10-CM | POA: Diagnosis not present

## 2022-05-26 DIAGNOSIS — E559 Vitamin D deficiency, unspecified: Secondary | ICD-10-CM | POA: Diagnosis not present

## 2022-05-26 DIAGNOSIS — M19012 Primary osteoarthritis, left shoulder: Secondary | ICD-10-CM | POA: Diagnosis not present

## 2022-05-26 DIAGNOSIS — J309 Allergic rhinitis, unspecified: Secondary | ICD-10-CM | POA: Diagnosis not present

## 2022-05-26 DIAGNOSIS — I10 Essential (primary) hypertension: Secondary | ICD-10-CM | POA: Diagnosis not present

## 2022-05-26 DIAGNOSIS — G3184 Mild cognitive impairment, so stated: Secondary | ICD-10-CM | POA: Diagnosis not present

## 2022-05-26 DIAGNOSIS — M069 Rheumatoid arthritis, unspecified: Secondary | ICD-10-CM | POA: Diagnosis not present

## 2022-05-26 DIAGNOSIS — M19041 Primary osteoarthritis, right hand: Secondary | ICD-10-CM | POA: Diagnosis not present

## 2022-05-26 DIAGNOSIS — E538 Deficiency of other specified B group vitamins: Secondary | ICD-10-CM | POA: Diagnosis not present

## 2022-05-26 DIAGNOSIS — H6690 Otitis media, unspecified, unspecified ear: Secondary | ICD-10-CM | POA: Diagnosis not present

## 2022-05-26 DIAGNOSIS — K648 Other hemorrhoids: Secondary | ICD-10-CM | POA: Diagnosis not present

## 2022-05-26 DIAGNOSIS — H9319 Tinnitus, unspecified ear: Secondary | ICD-10-CM | POA: Diagnosis not present

## 2022-05-26 NOTE — Progress Notes (Deleted)
Subjective:    Patient ID: Jackie Stevens, female    DOB: 10-14-1946, 76 y.o.   MRN: [id number]  HPI followed for obstructive sleep apnea complicated by GERD, HBP, allergic rhinitis, obesity NPSG  07/15/10--AHI 13.5/hour, desaturation to 87%, body weight 215 pounds PSG   ---------------------------------------------------------------------------------   05/28/21-  76 year old female never smoker followed for OSA, complicated by chronic cough, GERD, HBP, allergic rhinitis, Dementia,  CPAP auto 5-15/APS / [facility] Replaced CPAP machine 4/ 2020 Download- compliance 100%, 1.1/ hr      Husband Jackie Stevens is in hosp w respiratory failure Body weight today-195 lbs Covid vax- 3 Moderna We were referring for mask fitting, and she was going to try a helmet liner to protect her hair. -----No current respiratory concerns, does report cpap will occasionally "say words" and wake her up.  She is here with her son today. She volunteers that she has some dementia and she asks if this might be why she thinks she hears her CPAP "talking" sometimes at night (eg. Calling "Jackie Stevens"). Machine is abut 78 years old. It is comfortable and works well. She denies daytime breathing concerns.  Download reviewed with her.   05/29/22- 76 year old female never smoker followed for OSA, complicated by chronic cough, GERD, HTN, allergic rhinitis, Dementia,  Chan's MIL. Husband Jackie Stevens passed  away. CPAP auto 5-15/APS / Jackie Stevens Replaced CPAP machine 4/ 2020 Download- compliance      Body weight today- Covid vax- 3 Moderna  Review of Systems-see HPI + = positive Constitutional:   No-   weight loss, night sweats, fevers, chills, fatigue, lassitude. HEENT:   No-  headaches, difficulty swallowing, tooth/dental problems, sore throat,         No-sneezing, itching, ear ache,no- nasal congestion, post nasal drip,  CV:  No-   chest pain, orthopnea, PND, swelling in lower extremities, anasarca, dizziness, palpitations Resp: No-    shortness of breath with exertion or at rest.              No-   productive cough,   non-productive cough,  No-  coughing up of blood.              No-   change in color of mucus.  No- wheezing.   Skin: No-   rash or lesions. GI:  No-   heartburn, indigestion, abdominal pain, nausea, vomiting,  GU:  MS:  No-   joint pain or swelling.   Neuro- +dementia Psych:  No- change in mood or affect. No depression or anxiety.  No memory loss.   Objective:   Physical Exam General- Alert, Oriented, Affect-appropriate, Distress- none acute, + Morbidly obese Skin- rash-none, lesions- none, excoriation- none Lymphadenopathy- none Head- atraumatic            Eyes- Gross vision intact, PERRLA, conjunctivae clear secretions            Ears- Hearing, canals slightly retracted, no fluid            Nose- Clear, No-Septal dev, mucus, polyps, erosion, perforation             Throat- Mallampati III-IV , mucosa clear- not red , drainage- none, tonsils- atrophic, not hoarse Neck- flexible , trachea midline, no stridor , thyroid nl, carotid no bruit Chest - symmetrical excursion , unlabored           Heart/CV- RRR , murmur+ 1/6 LUSB , no gallop  , no rub, nl s1 s2                           -  JVD- none , edema- none, stasis changes- none, varices- none           Lung- clear to P&A, wheeze- none, cough- none , dullness-none, rub- none           Chest wall-  Abd-  Br/ Gen/ Rectal- Not done, not indicated Extrem- cyanosis- none, clubbing, none, atrophy- none, strength- nl Neuro- grossly intact to observation        Assessment & Plan:

## 2022-05-27 ENCOUNTER — Encounter: Payer: Self-pay | Admitting: Physician Assistant

## 2022-05-27 ENCOUNTER — Ambulatory Visit (INDEPENDENT_AMBULATORY_CARE_PROVIDER_SITE_OTHER): Payer: Medicare HMO

## 2022-05-27 ENCOUNTER — Telehealth: Payer: Medicare HMO

## 2022-05-27 ENCOUNTER — Ambulatory Visit (INDEPENDENT_AMBULATORY_CARE_PROVIDER_SITE_OTHER): Payer: Medicare HMO | Admitting: Physician Assistant

## 2022-05-27 DIAGNOSIS — M19041 Primary osteoarthritis, right hand: Secondary | ICD-10-CM | POA: Diagnosis not present

## 2022-05-27 DIAGNOSIS — E559 Vitamin D deficiency, unspecified: Secondary | ICD-10-CM | POA: Diagnosis not present

## 2022-05-27 DIAGNOSIS — M069 Rheumatoid arthritis, unspecified: Secondary | ICD-10-CM | POA: Diagnosis not present

## 2022-05-27 DIAGNOSIS — G4700 Insomnia, unspecified: Secondary | ICD-10-CM | POA: Diagnosis not present

## 2022-05-27 DIAGNOSIS — H60399 Other infective otitis externa, unspecified ear: Secondary | ICD-10-CM | POA: Diagnosis not present

## 2022-05-27 DIAGNOSIS — D649 Anemia, unspecified: Secondary | ICD-10-CM | POA: Diagnosis not present

## 2022-05-27 DIAGNOSIS — G8929 Other chronic pain: Secondary | ICD-10-CM | POA: Diagnosis not present

## 2022-05-27 DIAGNOSIS — E538 Deficiency of other specified B group vitamins: Secondary | ICD-10-CM | POA: Diagnosis not present

## 2022-05-27 DIAGNOSIS — H9319 Tinnitus, unspecified ear: Secondary | ICD-10-CM | POA: Diagnosis not present

## 2022-05-27 DIAGNOSIS — M25512 Pain in left shoulder: Secondary | ICD-10-CM

## 2022-05-27 DIAGNOSIS — I10 Essential (primary) hypertension: Secondary | ICD-10-CM | POA: Diagnosis not present

## 2022-05-27 DIAGNOSIS — K219 Gastro-esophageal reflux disease without esophagitis: Secondary | ICD-10-CM | POA: Diagnosis not present

## 2022-05-27 DIAGNOSIS — G4733 Obstructive sleep apnea (adult) (pediatric): Secondary | ICD-10-CM | POA: Diagnosis not present

## 2022-05-27 DIAGNOSIS — K5900 Constipation, unspecified: Secondary | ICD-10-CM | POA: Diagnosis not present

## 2022-05-27 DIAGNOSIS — M25552 Pain in left hip: Secondary | ICD-10-CM

## 2022-05-27 DIAGNOSIS — J309 Allergic rhinitis, unspecified: Secondary | ICD-10-CM | POA: Diagnosis not present

## 2022-05-27 DIAGNOSIS — K439 Ventral hernia without obstruction or gangrene: Secondary | ICD-10-CM | POA: Diagnosis not present

## 2022-05-27 DIAGNOSIS — H6690 Otitis media, unspecified, unspecified ear: Secondary | ICD-10-CM | POA: Diagnosis not present

## 2022-05-27 DIAGNOSIS — G3184 Mild cognitive impairment, so stated: Secondary | ICD-10-CM | POA: Diagnosis not present

## 2022-05-27 DIAGNOSIS — J4520 Mild intermittent asthma, uncomplicated: Secondary | ICD-10-CM | POA: Diagnosis not present

## 2022-05-27 DIAGNOSIS — E782 Mixed hyperlipidemia: Secondary | ICD-10-CM | POA: Diagnosis not present

## 2022-05-27 DIAGNOSIS — K648 Other hemorrhoids: Secondary | ICD-10-CM | POA: Diagnosis not present

## 2022-05-27 DIAGNOSIS — M19012 Primary osteoarthritis, left shoulder: Secondary | ICD-10-CM | POA: Diagnosis not present

## 2022-05-27 DIAGNOSIS — R69 Illness, unspecified: Secondary | ICD-10-CM | POA: Diagnosis not present

## 2022-05-27 NOTE — Progress Notes (Signed)
Office Visit Note   Patient: Jackie Stevens           Date of Birth: 08-15-1946           MRN: [id number] Visit Date: 05/27/2022              Requested by: Mosie Lukes, MD [address] [place],  [place] [id number] PCP: Mosie Lukes, MD   Assessment & Plan: Visit Diagnoses:  1. Chronic left shoulder pain   2. Pain of left hip     Plan:  Given the fact that patient has no significant pain in her left hip recommend IT band stretching given findings of trochanteric bursitis on exam.  Reassurance was given that both of her hip joints look well-preserved on radiographs.  In regards to her left shoulder she does have severe arthritis and I went over the films with her and her son today however she has minimal discomfort in her left shoulder and this is her nondominant arm.  Offered an intra-articular injection in the shoulder she defers states that her pain is not that bad.  Therefore recommend Voltaren gel to the shoulder 2 g up to 4 times daily.  She will follow-up with [place] as needed.  She changes her mind and wants an injection in the shoulder we can always arrange this.  Questions were encouraged and answered at length.  Patient's son was present throughout the examination today.  Follow-Up Instructions: Return if symptoms worsen or fail to improve.   Orders:  Orders Placed This Encounter  Procedures   XR Shoulder Left   XR HIP UNILAT W OR W/O PELVIS 2-3 VIEWS LEFT   No orders of the defined types were placed in this encounter.     Procedures: No procedures performed   Clinical Data: No additional findings.   Subjective: Chief Complaint  Patient presents with   Left Shoulder - Pain   Left Hip - Pain    HPI Jackie Stevens comes in today for left shoulder and left hip pain.  States her shoulder pain is moderate and is been ongoing for 5 months.  She also has some pain cannot really describe if it is coming from her hip or from her low back.  She has no  groin pain.  She does have dementia and states she forgets really where her pain is coming from.  Her son is present with her today and states she mainly complains about her shoulder and that her shoulder pains became worse over the last few months.  She denies any numbness tingling down either arm.  She has tried Tylenol and gabapentin and IcyHot with some relief. Review of Systems See HPI otherwise negative or noncontributory.  Objective: Vital Signs: There were no vitals taken for this visit.  Physical Exam Constitutional:      Appearance: She is not ill-appearing or diaphoretic.  Pulmonary:     Effort: Pulmonary effort is normal.  Neurological:     Mental Status: She is alert.  Psychiatric:        Behavior: Behavior normal.     Ortho Exam Bilateral hips excellent range of motion without pain tenderness over the trochanteric region bilaterally. Bilateral shoulders she has full range of motion of the right shoulder without pain.  Left shoulder active forward flexion to approximately 140 degrees.  Passively I can bring her to approximately 100 5060 degrees with some discomfort.  Internal/external rotation of the left shoulder reveals crepitus.  Specialty Comments:  No specialty comments available.  Imaging: XR Shoulder Left  Result Date: 05/27/2022 Left shoulder: Shoulder is well located.  Severe end-stage arthritis with basically bone-on-bone arthritis of the glenoid humeral joint.  No acute fractures.  No bony lesions.  XR HIP UNILAT W OR W/O PELVIS 2-3 VIEWS LEFT  Result Date: 05/27/2022 AP pelvis lateral view of the left hip: No acute fracture.  Bilateral hips well located.  No significant arthritic changes.  Trochanteric region with slight sclerotic activity bilaterally.    PMFS History: Patient Active Problem List   Diagnosis Date Noted   Unsteady gait 04/21/2022   Left hip pain 09/16/2021   Primary osteoarthritis of left knee 01/19/2021   Osteoarthritis of left knee  11/21/2020   Dementia with anxiety ([place]) 02/05/2020   Hyperglycemia 01/17/2020   Vertigo 08/05/2019   Palpitations 05/28/2019   Other fatigue 02/28/2018   Shortness of breath on exertion 02/28/2018   Vitamin D deficiency 02/28/2018   Obesity 09/11/2017   Dermatitis 03/06/2017   Arthritis of left shoulder region 09/30/2016   Pain of joint of left ankle and foot 09/15/2016   Left shoulder pain 09/14/2016   Preventative health care 02/26/2016   Radicular pain of sacrum 02/26/2016   Vitamin B12 deficiency 02/26/2016   Ventral hernia 12/02/2015   Pelvic mass in female 12/02/2015   Colitis 09/14/2015   Paresthesia 03/23/2015   Hyponatremia 03/13/2015   Headache 08/21/2014   Low back pain 06/09/2014   Anemia 10/06/2013   Medicare annual wellness visit, subsequent 10/06/2013   Asthma, mild intermittent 04/02/2013   Internal hemorrhoid, bleeding 01/23/2013   Diverticulitis of rectosigmoid 11/28/2012   Colonic diverticular abscess 11/21/2012   Abnormal cervical cytology 10/25/2012   Cancer ([place])    Insomnia    Hypothyroidism    Constipation    Cough 09/02/2012   Chest pain 08/05/2011   Hyperlipidemia, mixed 11/08/2010   Essential hypertension 11/08/2010   GERD (gastroesophageal reflux disease) 11/08/2010   Obstructive sleep apnea 11/04/2010   Past Medical History:  Diagnosis Date   Abnormal cervical cytology 10/25/2012   Follows with Dr Leavy Cella of Gyn   Allergic rhinitis 11/04/2010   Qualifier: Diagnosis of  By: Annamaria Boots MD, [place] D    Anemia 10/06/2013   Anginal pain    pt has history of CP states had cardiac workup with no specific issues identified    Arthritis    knees; right thumb; shoulders   Arthritis of left shoulder region 09/30/2016   X-ray of the left shoulder on 09/14/2016: Marked degenerative change with significant osteophytic spurring and subchondral sclerosis.  Loss of glenohumeral joint space.  Well-maintained subacromial space.  Injected 09/30/2016 [name] .   Asthma, mild intermittent 04/02/2013   Chicken pox as a child   Colonic diverticular abscess 11/21/2012   Constipation    Decreased hearing    Dermatitis 03/06/2017   Diverticulitis 10/25/2012   pt. reports that a drain was placed - 09/2012     Diverticulitis of rectosigmoid 11/28/2012   Dry mouth    Essential hypertension 11/08/2010   Qualifier: Diagnosis of  By: Annamaria Boots MD, Clinton D    Excessive thirst    External hemorrhoid, bleeding    "sometimes" (12/19/2012)   Frequent urination    GERD (gastroesophageal reflux disease)    H/O hiatal hernia    Hand tingling    Heart murmur    History of kidney stones    Hyperglycemia 01/17/2020   Hyperlipidemia  Hypothyroidism    Incontinence    Insomnia    Kidney stones 1970's   "passed on their own" (01-05-13)   Low back pain 06/09/2014   Measles as a child   Mild neurocognitive disorder 02/05/2020   Obesity 09/11/2017   Obstructive sleep apnea 11/04/2010   NPSG [facility] 07/15/10- AHI 13.5/ hr CPAP 10/APS     Otitis media 01/07/2014   Palpitations    Paresthesia 03/23/2015   Left face   PONV (postoperative nausea and vomiting)    Rheumatoid arteritis    Shortness of breath dyspnea    using stairs   Sinus pain    Swelling of both lower extremities    Thyroid cancer 1980's   Tinnitus    UTI (urinary tract infection) 04/02/2013   Vertigo 08/05/2019   Vitamin D deficiency 02/28/2018    Family History  Problem Relation Age of Onset   Heart disease Father    Pneumonia Father    Hypertension Father    Hyperlipidemia Father    Cancer Father        skin   Stroke Father    Alzheimer's disease Mother    Heart disease Mother    Depression Mother    Emphysema Brother        marijuana and cigarettes   Alcohol abuse Brother    Hearing loss Brother    Diabetes Maternal Grandmother    Alzheimer's disease Paternal Grandmother    Cancer Paternal Grandmother        lung?- smoker   Hyperlipidemia Paternal Grandmother    Heart attack  Paternal Grandfather    Alcohol abuse Paternal Grandfather    Neurofibromatosis Son        schwanomatosis   Neurofibromatosis Son        swanomatosis   Cancer Paternal Aunt     Past Surgical History:  Procedure Laterality Date   CHOLECYSTECTOMY  1990   COLON SURGERY     COLOSTOMY REVISION  2013-01-05   Procedure: COLON RESECTION SIGMOID;  Surgeon: Gwenyth Ober, MD;  Location: [facility];  Service: General;  Laterality: N/A;   CYSTOSCOPY WITH STENT PLACEMENT  05-Jan-2013   Procedure: CYSTOSCOPY WITH STENT PLACEMENT;  Surgeon: Hanley Ben, MD;  Location: [facility];  Service: Urology;  Laterality: N/A;   DILATION AND CURETTAGE OF UTERUS  1960's   "lots of them; had miscarriages" (Jan 05, 2013)   LYSIS OF ADHESION N/A 12/02/2015   Procedure: LAPAROSCOPIC LYSIS OF ADHESION;  Surgeon: Johnathan Hausen, MD;  Location: [facility];  Service: General;  Laterality: N/A;   ROBOTIC ASSISTED BILATERAL SALPINGO OOPHERECTOMY Bilateral 12/02/2015   Procedure: XI ROBOTIC ASSISTED BILATERAL SALPINGO OOPHORECTOMY;  Surgeon: Everitt Amber, MD;  Location: [facility];  Service: Gynecology;  Laterality: Bilateral;   SIGMOID RESECTION / RECTOPEXY  2013/01/05   THYROIDECTOMY, PARTIAL  1988   "then did iodine to remove the rest" (01/05/2013)   TONSILLECTOMY  1951?   TOTAL KNEE ARTHROPLASTY Left 01/19/2021   Procedure: TOTAL KNEE ARTHROPLASTY;  Surgeon: Gaynelle Arabian, MD;  Location: [facility];  Service: Orthopedics;  Laterality: Left;  39mn   TRANSRECTAL DRAINAGE OF PELVIC ABSCESS  10/27/2012   VAGINAL HYSTERECTOMY  1970's   "still have my ovaries" (124-Jan-2014   Social History   Occupational History   Occupation: Retired  Tobacco Use   Smoking status: Never    Passive exposure: Never   Smokeless tobacco: Never   Tobacco comments:    Verified by SChristena Flake Vaping Use   Vaping  Use: Never used  Substance and Sexual Activity   Alcohol use: No    Alcohol/week: 0.0 standard drinks of alcohol   Drug use: No   Sexual activity: Not  Currently

## 2022-05-28 ENCOUNTER — Ambulatory Visit: Payer: Medicare HMO | Admitting: Internal Medicine

## 2022-05-29 NOTE — Progress Notes (Signed)
[id number] Subjective:    Patient ID: Jackie Stevens, female    DOB: October 17, 1946, 76 y.o.   MRN: [id number]  HPI followed for obstructive sleep apnea complicated by GERD, HBP, allergic rhinitis, obesity NPSG  07/15/10--AHI 13.5/hour, desaturation to 87%, body weight 215 pounds PSG   ---------------------------------------------------------------------------------   05/28/21-  76 year old female never smoker followed for OSA, complicated by chronic cough, GERD, HBP, allergic rhinitis, Dementia,  CPAP auto 5-15/APS / [facility] Replaced CPAP machine 4/ 2020 Download- compliance 100%, 1.1/ hr      Husband [name] is in hosp w respiratory failure Body weight today-195 lbs Covid vax- 3 Moderna We were referring for mask fitting, and she was going to try a helmet liner to protect her hair. -----No current respiratory concerns, does report cpap will occasionally "say words" and wake her up.  She is here with her son today. She volunteers that she has some dementia and she asks if this might be why she thinks she hears her CPAP "talking" sometimes at night (eg. Calling "[name]"). Machine is abut 78 years old. It is comfortable and works well. She denies daytime breathing concerns.  Download reviewed with her.   05/31/22-  76 year old female never smoker followed for OSA, complicated by chronic cough, GERD, HBP, allergic rhinitis, Dementia,  CPAP auto 5-15/APS / [facility] Replaced CPAP machine 4/ 2020 Download- compliance 39%, AHI 3.3/ hr Body weight today-178 lbs Covid vax- 3 Moderna                 Son here -----Patient's son states that she has been tired during the day but patient has memory issues and forgets to turn off cpap.  Download reviewed. Family notes she is more tired if forgets to use CPAP some nights. Son is now putting it on her at bedtime and thinks she is doing better.   Review of Systems-see HPI + = positive Constitutional:   No-   weight loss, night sweats, fevers, chills, fatigue,  lassitude. HEENT:   No-  headaches, difficulty swallowing, tooth/dental problems, sore throat,         No-sneezing, itching, ear ache,no- nasal congestion, post nasal drip,  CV:  No-   chest pain, orthopnea, PND, swelling in lower extremities, anasarca, dizziness, palpitations Resp: No-   shortness of breath with exertion or at rest.              No-   productive cough,   non-productive cough,  No-  coughing up of blood.              No-   change in color of mucus.  No- wheezing.   Skin: No-   rash or lesions. GI:  No-   heartburn, indigestion, abdominal pain, nausea, vomiting,  GU:  MS:  No-   joint pain or swelling.   Neuro- +dementia Psych:  No- change in mood or affect. No depression or anxiety.  No memory loss.   Objective:   Physical Exam General- Alert, Oriented, Affect-appropriate, Distress- none acute, + Morbidly obese Skin- rash-none, lesions- none, excoriation- none Lymphadenopathy- none Head- atraumatic            Eyes- Gross vision intact, PERRLA, conjunctivae clear secretions            Ears- Hearing, canals slightly retracted, no fluid            Nose- Clear, No-Septal dev, mucus, polyps, erosion, perforation             Throat-  Mallampati III-IV , mucosa clear- not red , drainage- none, tonsils- atrophic, not hoarse Neck- flexible , trachea midline, no stridor , thyroid nl, carotid no bruit Chest - symmetrical excursion , unlabored           Heart/CV- RRR , murmur+ 1/6 LUSB , no gallop  , no rub, nl s1 s2                           - JVD- none , edema- none, stasis changes- none, varices- none           Lung- clear to P&A, wheeze- none, cough- none , dullness-none, rub- none           Chest wall-  Abd-  Br/ Gen/ Rectal- Not done, not indicated Extrem- cyanosis- none, clubbing, none, atrophy- none, strength- nl Neuro- grossly intact to observation        Assessment & Plan:

## 2022-05-31 ENCOUNTER — Ambulatory Visit: Payer: Medicare HMO | Admitting: *Deleted

## 2022-05-31 ENCOUNTER — Encounter: Payer: Self-pay | Admitting: Internal Medicine

## 2022-05-31 ENCOUNTER — Ambulatory Visit (INDEPENDENT_AMBULATORY_CARE_PROVIDER_SITE_OTHER): Payer: Medicare HMO | Admitting: Internal Medicine

## 2022-05-31 ENCOUNTER — Ambulatory Visit: Payer: Medicare HMO

## 2022-05-31 DIAGNOSIS — R69 Illness, unspecified: Secondary | ICD-10-CM | POA: Diagnosis not present

## 2022-05-31 DIAGNOSIS — K648 Other hemorrhoids: Secondary | ICD-10-CM | POA: Diagnosis not present

## 2022-05-31 DIAGNOSIS — I10 Essential (primary) hypertension: Secondary | ICD-10-CM

## 2022-05-31 DIAGNOSIS — E782 Mixed hyperlipidemia: Secondary | ICD-10-CM

## 2022-05-31 DIAGNOSIS — K5900 Constipation, unspecified: Secondary | ICD-10-CM | POA: Diagnosis not present

## 2022-05-31 DIAGNOSIS — M19041 Primary osteoarthritis, right hand: Secondary | ICD-10-CM | POA: Diagnosis not present

## 2022-05-31 DIAGNOSIS — J4520 Mild intermittent asthma, uncomplicated: Secondary | ICD-10-CM

## 2022-05-31 DIAGNOSIS — H9319 Tinnitus, unspecified ear: Secondary | ICD-10-CM | POA: Diagnosis not present

## 2022-05-31 DIAGNOSIS — K219 Gastro-esophageal reflux disease without esophagitis: Secondary | ICD-10-CM | POA: Diagnosis not present

## 2022-05-31 DIAGNOSIS — R202 Paresthesia of skin: Secondary | ICD-10-CM

## 2022-05-31 DIAGNOSIS — G4700 Insomnia, unspecified: Secondary | ICD-10-CM | POA: Diagnosis not present

## 2022-05-31 DIAGNOSIS — K439 Ventral hernia without obstruction or gangrene: Secondary | ICD-10-CM | POA: Diagnosis not present

## 2022-05-31 DIAGNOSIS — E559 Vitamin D deficiency, unspecified: Secondary | ICD-10-CM | POA: Diagnosis not present

## 2022-05-31 DIAGNOSIS — D649 Anemia, unspecified: Secondary | ICD-10-CM | POA: Diagnosis not present

## 2022-05-31 DIAGNOSIS — G3184 Mild cognitive impairment, so stated: Secondary | ICD-10-CM | POA: Diagnosis not present

## 2022-05-31 DIAGNOSIS — M19012 Primary osteoarthritis, left shoulder: Secondary | ICD-10-CM | POA: Diagnosis not present

## 2022-05-31 DIAGNOSIS — J309 Allergic rhinitis, unspecified: Secondary | ICD-10-CM | POA: Diagnosis not present

## 2022-05-31 DIAGNOSIS — H6690 Otitis media, unspecified, unspecified ear: Secondary | ICD-10-CM | POA: Diagnosis not present

## 2022-05-31 DIAGNOSIS — F0394 Unspecified dementia, unspecified severity, with anxiety: Secondary | ICD-10-CM

## 2022-05-31 DIAGNOSIS — M069 Rheumatoid arthritis, unspecified: Secondary | ICD-10-CM | POA: Diagnosis not present

## 2022-05-31 DIAGNOSIS — E538 Deficiency of other specified B group vitamins: Secondary | ICD-10-CM | POA: Diagnosis not present

## 2022-05-31 DIAGNOSIS — H60399 Other infective otitis externa, unspecified ear: Secondary | ICD-10-CM | POA: Diagnosis not present

## 2022-05-31 DIAGNOSIS — G4733 Obstructive sleep apnea (adult) (pediatric): Secondary | ICD-10-CM | POA: Diagnosis not present

## 2022-05-31 NOTE — Patient Instructions (Signed)
We can continue CPAP auto 5-15. You rest better when you wear it.   Please call if we can help.

## 2022-05-31 NOTE — Chronic Care Management (AMB) (Signed)
Chronic Care Management    Clinical Social Work Note  05/31/2022 Name: Jackie Stevens MRN: [id number] DOB: 1946/12/08  [name] is a 76 y.o. year old female who is a primary care patient of Mosie Lukes, MD. The CCM team was consulted to assist the patient with chronic disease management and/or care coordination needs related to: [employer], Level of Care Concerns, and Caregiver Stress.   Engaged with patient's son by telephone for follow up visit in response to provider referral for social work chronic care management and care coordination services.   Consent to Services:  The patient was given information about Chronic Care Management services, agreed to services, and gave verbal consent prior to initiation of services.  Please see initial visit note for detailed documentation.   Patient agreed to services and consent obtained.   Assessment: Review of patient past medical history, allergies, medications, and health status, including review of relevant consultants reports was performed today as part of a comprehensive evaluation and provision of chronic care management and care coordination services.     SDOH (Social Determinants of Health) assessments and interventions performed:    Advanced Directives Status: Not addressed in this encounter.  CCM Care Plan  Allergies  Allergen Reactions   Neomycin-Bacitracin Zn-Polymyx Rash    Polysporin- is tolerated    Niacin Other (See Comments) and Cough    "cough til I threw up" (12/19/2012)   Ciprofloxacin Hives    Got cipro and flagyl at same time, localized hives to IV arm   Flagyl [Metronidazole] Hives    Got cipro and flagyl at same time, localized hives to IV arm    Outpatient Encounter Medications as of 05/31/2022  Medication Sig Note   amLODipine (NORVASC) 2.5 MG tablet Take 1 tablet (2.5 mg total) by mouth daily.    Azelastine HCl 137 MCG/SPRAY SOLN PLACE 2 SPRAYS INTO BOTH NOSTRILS 2 (TWO) TIMES DAILY.     benzonatate (TESSALON) 100 MG capsule TAKE 1-2 CAPSULES BY MOUTH 3 TIMES DAILY AS NEEDED FOR COUGH.    Black Pepper-Turmeric (TURMERIC CURCUMIN) 04-999 MG CAPS 1 tablet daily.    carboxymethylcellulose (REFRESH PLUS) 0.5 % SOLN Place 1 drop into both eyes 3 (three) times daily as needed (dry eyes). 01/01/2021: [employer] Brand    Cholecalciferol-Vitamin C (VITAMIN D3-VITAMIN C) 1000-500 UNIT-MG CAPS daily.    docusate sodium (COLACE) 100 MG capsule Take 100 mg by mouth daily.    donepezil (ARICEPT) 10 MG tablet Take one tab daily    famotidine (PEPCID) 20 MG tablet TAKE 2 TABLETS BY MOUTH EVERY DAY    FLUoxetine (PROZAC) 10 MG tablet TAKE 1 TABLET BY MOUTH EVERY DAY    fluticasone (FLONASE) 50 MCG/ACT nasal spray PRN (seasonal)    gabapentin (NEURONTIN) 300 MG capsule Take 1 capsule (300 mg total) by mouth at bedtime.    hydrochlorothiazide (HYDRODIURIL) 25 MG tablet TAKE 1 TABLET (25 MG TOTAL) BY MOUTH DAILY.    levothyroxine (SYNTHROID) 112 MCG tablet Take 1 tablet (112 mcg total) by mouth daily before breakfast.    losartan (COZAAR) 100 MG tablet TAKE 1 TABLET BY MOUTH EVERYDAY AT BEDTIME    metoprolol tartrate (LOPRESSOR) 100 MG tablet TAKE 1&1/2 TABLETS BY MOUTH TWICE A DAY    naproxen (NAPROSYN) 375 MG tablet TAKE 1 TABLET (375 MG TOTAL) BY MOUTH 2 (TWO) TIMES DAILY AS NEEDED FOR MODERATE PAIN OR HEADACHE.    nitroGLYCERIN (NITROSTAT) 0.4 MG SL tablet PLACE 1 TABLET UNDER THE TONGUE EVERY  5 MINUTES AS NEEDED FOR CHEST PAIN 01/19/2021: Not needed.    omeprazole (PRILOSEC) 40 MG capsule TAKE 1 CAPSULE BY MOUTH TWICE A DAY    polyethylene glycol (MIRALAX / GLYCOLAX) 17 g packet Take 17 g by mouth daily.    Psyllium-Calcium (METAMUCIL PLUS CALCIUM) CAPS daily.    rosuvastatin (CRESTOR) 40 MG tablet TAKE 1 TABLET BY MOUTH EVERY DAY    temazepam (RESTORIL) 7.5 MG capsule 1 po every other day for 1 week, then every third day for 1 week. 04/30/2022: Reports takes every third day   No  facility-administered encounter medications on file as of 05/31/2022.    Patient Active Problem List   Diagnosis Date Noted   Unsteady gait 04/21/2022   Left hip pain 09/16/2021   Primary osteoarthritis of left knee 01/19/2021   Osteoarthritis of left knee 11/21/2020   Dementia with anxiety ([place]) 02/05/2020   Hyperglycemia 01/17/2020   Vertigo 08/05/2019   Palpitations 05/28/2019   Other fatigue 02/28/2018   Shortness of breath on exertion 02/28/2018   Vitamin D deficiency 02/28/2018   Obesity 09/11/2017   Dermatitis 03/06/2017   Arthritis of left shoulder region 09/30/2016   Pain of joint of left ankle and foot 09/15/2016   Left shoulder pain 09/14/2016   Preventative health care 02/26/2016   Radicular pain of sacrum 02/26/2016   Vitamin B12 deficiency 02/26/2016   Ventral hernia 12/02/2015   Pelvic mass in female 12/02/2015   Colitis 09/14/2015   Paresthesia 03/23/2015   Hyponatremia 03/13/2015   Headache 08/21/2014   Low back pain 06/09/2014   Anemia 10/06/2013   Medicare annual wellness visit, subsequent 10/06/2013   Asthma, mild intermittent 04/02/2013   Internal hemorrhoid, bleeding 01/23/2013   Diverticulitis of rectosigmoid 11/28/2012   Colonic diverticular abscess 11/21/2012   Abnormal cervical cytology 10/25/2012   Cancer ([place])    Insomnia    Hypothyroidism    Constipation    Cough 09/02/2012   Chest pain 08/05/2011   Hyperlipidemia, mixed 11/08/2010   Essential hypertension 11/08/2010   GERD (gastroesophageal reflux disease) 11/08/2010   Obstructive sleep apnea 11/04/2010    Conditions to be addressed/monitored: HTN, HLD, and GERD.  Limited Social Support, Level of Care Concerns, ADL/IADL Limitations, Social Isolation, Limited Access to Caregiver, and Lacks Knowledge of [employer].  Care Plan : LCSW Plan of Care  Updates made by Francis Gaines, LCSW since 05/31/2022 12:00 AM     Problem: Improve My Quality of Life Through [place] and Adult Day Care Program Services. Resolved 05/31/2022  Priority: High     Goal: Improve My Quality of Life Through [place] and Adult Day Care Program Services. Completed 05/31/2022  Start Date: 05/13/2022  Expected End Date: 05/31/2022  This Visit's Progress: On track  Recent Progress: On track  Priority: High  Note:   Current Barriers:   Patient with Essential Hypertension, Dementia with Anxiety, Osteoarthritis of Left Knee, and Unsteady Gait, needs Support, Education, Resources, Referrals, Advocacy, and Care Coordination, to resolve unmet personal care needs in the home. Patient is unable to self-administer medications as prescribed. Patient is unable to consistently perform ADL's/IADL's independently. Patient now requires 24 hour care and supervision, which son is trying to provide, as well as work full-time.   Financial constraints related to inability to pay for in-home care services out-of-pocket. Lacks knowledge of available community agencies and resources. Clinical Goals:  Patient's son will work with LCSW, in an effort to place patient into an Adult  Day Care Program of choice.   Patient will attend all scheduled medical appointments, as evidenced by son report, and care team review of appointment completion in electronic medical record. Patient will demonstrate improved health management independence, as evidenced by having Adult Day Care Program Services in place.     Interventions: Collaboration with Primary Care Physician, Dr. Penni Homans regarding development and update of comprehensive plan of care, as evidenced by provider attestation and co-signature. Inter-disciplinary care team collaboration (see longitudinal plan of care). Consideration of In-Home Care Services Encouraged.  Clinical Interventions: Assessed needs, level of care concerns, basic eligibility, and provided education on Adult Day Care Program enrollment process. Identified resources  and durable medical equipment needed in the home to improve safety and promote independence. Patient Goals/Self-Care Activities:  Your son has enrolled you in [facility], which you will attend two days per week, Monday's and Friday's, from 8:45 am until 2:30 pm. Your son will contact LCSW directly (# [phone]), if he has questions, needs assistance, or if additional social work needs are identified in the near future.   No Follow-Up Required.     [place] Licensed Clinical Social Worker [facility] [phone]

## 2022-05-31 NOTE — Patient Instructions (Addendum)
Visit Information  Thank you for taking time to visit with me today. Please don't hesitate to contact me if I can be of assistance to you before our next scheduled telephone appointment.  Following are the goals we discussed today:  Patient Goals/Self-Care Activities: Take medications as prescribed   Attend all scheduled provider appointments Continue to check and record blood pressure. Notify Provider if consisently out of recommended range. Fall prevention strategies: Be cautious when walking. Remove any obstacles that may increase the risk of trips and falls. Walk always in a well-lit room (use night lights in the walls). Avoid area rugs or power cords from appliances in the middle of the walkways. Use a walker at all times. Safe environment strategies: personal care assistance and supervision. Monitor the home for safety hazards: kitchen potential safety hazards- use appliances that have an automatic shut-off feature, apply stove knob covers, remove knobs or turn off gas when stove is not in use, disconnect the garbage disposal. Monitor your home for hazardous equipment/chemicals-Keep out of reach for example medications, alcohol, matches sharp objects, cleaning products, laundry pacs, bleach and other harmful chemicals. Contact [employer] for more information [phone] Consider Personal care service provider if needed. Contact your nurse care management coordinator as needed for care management and/or care coordination needs  Contact your provider for any new concerns or problems   Our next appointment is by telephone on 07/26/22 at 9:00 am  Please call the care guide team at [phone] if you need to cancel or reschedule your appointment.   If you are experiencing a Mental Health or [place] or need someone to talk to, please call the Suicide and Crisis Lifeline: 988 call 1-800-273-TALK (toll free, 24 hour hotline)   Patient verbalizes understanding of  instructions and care plan provided today and agrees to view in [place]. Active MyChart status and patient understanding of how to access instructions and care plan via MyChart confirmed with patient.     Thea Silversmith, RN, MSN, BSN, CCM Care Management Coordinator [facility] [phone]

## 2022-05-31 NOTE — Chronic Care Management (AMB) (Signed)
Chronic Care Management   CCM RN Visit Note  05/31/2022 Name: Jackie Stevens MRN: [id number] DOB: July 12, 1946  Subjective: Jackie Stevens is a 76 y.o. year old female who is a primary care patient of Jackie Lukes, MD. The care management team was consulted for assistance with disease management and care coordination needs.    Engaged with patient by telephone for follow up visit in response to provider referral for case management and/or care coordination services.   Consent to Services:  The patient was given information about Chronic Care Management services, agreed to services, and gave verbal consent prior to initiation of services.  Please see initial visit note for detailed documentation.   Patient agreed to services and verbal consent obtained.   Assessment: Review of patient past medical history, allergies, medications, health status, including review of consultants reports, laboratory and other test data, was performed as part of comprehensive evaluation and provision of chronic care management services.   SDOH (Social Determinants of Health) assessments and interventions performed:    CCM Care Plan  Allergies  Allergen Reactions   Neomycin-Bacitracin Zn-Polymyx Rash    Polysporin- is tolerated    Niacin Other (See Comments) and Cough    "cough til I threw up" (12/19/2012)   Ciprofloxacin Hives    Got cipro and flagyl at same time, localized hives to IV arm   Flagyl [Metronidazole] Hives    Got cipro and flagyl at same time, localized hives to IV arm    Outpatient Encounter Medications as of 05/31/2022  Medication Sig Note   amLODipine (NORVASC) 2.5 MG tablet Take 1 tablet (2.5 mg total) by mouth daily.    Azelastine HCl 137 MCG/SPRAY SOLN PLACE 2 SPRAYS INTO BOTH NOSTRILS 2 (TWO) TIMES DAILY.    benzonatate (TESSALON) 100 MG capsule TAKE 1-2 CAPSULES BY MOUTH 3 TIMES DAILY AS NEEDED FOR COUGH.    Black Pepper-Turmeric (TURMERIC CURCUMIN) 04-999 MG CAPS 1 tablet daily.     carboxymethylcellulose (REFRESH PLUS) 0.5 % SOLN Place 1 drop into both eyes 3 (three) times daily as needed (dry eyes). 01/01/2021: [employer] Brand    Cholecalciferol-Vitamin C (VITAMIN D3-VITAMIN C) 1000-500 UNIT-MG CAPS daily.    docusate sodium (COLACE) 100 MG capsule Take 100 mg by mouth daily.    donepezil (ARICEPT) 10 MG tablet Take one tab daily    famotidine (PEPCID) 20 MG tablet TAKE 2 TABLETS BY MOUTH EVERY DAY    FLUoxetine (PROZAC) 10 MG tablet TAKE 1 TABLET BY MOUTH EVERY DAY    fluticasone (FLONASE) 50 MCG/ACT nasal spray PRN (seasonal)    gabapentin (NEURONTIN) 300 MG capsule Take 1 capsule (300 mg total) by mouth at bedtime.    hydrochlorothiazide (HYDRODIURIL) 25 MG tablet TAKE 1 TABLET (25 MG TOTAL) BY MOUTH DAILY.    levothyroxine (SYNTHROID) 112 MCG tablet Take 1 tablet (112 mcg total) by mouth daily before breakfast.    losartan (COZAAR) 100 MG tablet TAKE 1 TABLET BY MOUTH EVERYDAY AT BEDTIME    metoprolol tartrate (LOPRESSOR) 100 MG tablet TAKE 1&1/2 TABLETS BY MOUTH TWICE A DAY    naproxen (NAPROSYN) 375 MG tablet TAKE 1 TABLET (375 MG TOTAL) BY MOUTH 2 (TWO) TIMES DAILY AS NEEDED FOR MODERATE PAIN OR HEADACHE.    nitroGLYCERIN (NITROSTAT) 0.4 MG SL tablet PLACE 1 TABLET UNDER THE TONGUE EVERY 5 MINUTES AS NEEDED FOR CHEST PAIN 01/19/2021: Not needed.    omeprazole (PRILOSEC) 40 MG capsule TAKE 1 CAPSULE BY MOUTH TWICE A DAY  polyethylene glycol (MIRALAX / GLYCOLAX) 17 g packet Take 17 g by mouth daily.    Psyllium-Calcium (METAMUCIL PLUS CALCIUM) CAPS daily.    rosuvastatin (CRESTOR) 40 MG tablet TAKE 1 TABLET BY MOUTH EVERY DAY    temazepam (RESTORIL) 7.5 MG capsule 1 po every other day for 1 week, then every third day for 1 week. 04/30/2022: Reports takes every third day   No facility-administered encounter medications on file as of 05/31/2022.    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[place]) 02/05/2020   Hyperglycemia 01/17/2020   Vertigo 08/05/2019   Palpitations 05/28/2019   Other fatigue 02/28/2018   Shortness of breath on exertion 02/28/2018   Vitamin D deficiency 02/28/2018   Obesity 09/11/2017   Dermatitis 03/06/2017   Arthritis of left shoulder region 09/30/2016   Pain of joint of left ankle and foot 09/15/2016   Left shoulder pain 09/14/2016   Preventative health care 02/26/2016   Radicular pain of sacrum 02/26/2016   Vitamin B12 deficiency 02/26/2016   Ventral hernia 12/02/2015   Pelvic mass in female 12/02/2015   Colitis 09/14/2015   Paresthesia 03/23/2015   Hyponatremia 03/13/2015   Headache 08/21/2014   Low back pain 06/09/2014   Anemia 10/06/2013   Medicare annual wellness visit, subsequent 10/06/2013   Asthma, mild intermittent 04/02/2013   Internal hemorrhoid, bleeding 01/23/2013   Diverticulitis of rectosigmoid 11/28/2012   Colonic diverticular abscess 11/21/2012   Abnormal cervical cytology 10/25/2012   Cancer ([place])    Insomnia    Hypothyroidism    Constipation    Cough 09/02/2012   Chest pain 08/05/2011   Hyperlipidemia, mixed 11/08/2010   Essential hypertension 11/08/2010   GERD (gastroesophageal reflux disease) 11/08/2010   Obstructive sleep apnea 11/04/2010    Conditions to be addressed/monitored:HTN and Dementia  Care Plan : RN Care Manager Plan of Care  Updates made by Jackie Rued, RN since 05/31/2022 12:00 AM     Problem: No Plan of Care Established for Management of Chronic Disease and/or  Care Coordination needs   Priority: High     Long-Range Goal: Development of Plan of Care for Chronic Disease Management and/or Care Coordination needs   Start Date: 04/30/2022  Expected End Date: 10/31/2022  Priority: High  Note:   Current Barriers:  05/31/22 RNCM spoke with patient's son, Jackie Stevens. Mr. Jackie Stevens reports  provider increased donepazil. He reports patient has improved overall. Reports son, Jackie Stevens, is not as anxious. She has life line around her neck. Has started with adult day care at [facility], started in Friday. Mr. Matsen states that she enjoyed her self there- transportation provided by [facility]. Reports his brother has become more active with providing support. He is checking blood pressure and reports last BP check 145/82 on 05/29/22. Mr. Kushner reports blood pressure goal per provider is <140/90.  Mr. Delisa reports currently has the support that is needed. Care Coordination needs related to Limited social support, Memory Deficits, and Lacks knowledge of community resource: Adult day programs; personal care service organizations, home modification resources  Chronic Disease Management support and education needs related to HTN and Dementia  RNCM Clinical Goal(s):  Patient will verbalize understanding of plan for management of HTN and Dementia as evidenced by patient/caregiver report attend all scheduled medical appointments:   as evidenced by  chart review and/or patient/caregiver report        work with community resource care guide to address needs related to Limited social support and Memory Deficits as evidenced by patient and/or community resource care guide support    through collaboration with Consulting civil engineer, provider, and care team.   Interventions: 1:1 collaboration with primary care provider regarding development and update of comprehensive plan of care as evidenced by provider attestation and co-signature Inter-disciplinary care team collaboration (see longitudinal plan of care) Evaluation of current treatment plan related to  self management and patient's adherence to plan as established by provider  Hypertension Interventions:  (Status:  Goal on track:  Yes.) Long Term Goal Last practice recorded BP readings:  Blood pressure at home 145/82 05/29/22. BP Readings from Last 3 Encounters:   05/18/22 138/86  05/03/22 (!) 183/87  04/21/22 132/76  Most recent eGFR/CrCl: No results found for: EGFR  No components found for: CRCL  Evaluation of current treatment plan related to hypertension self management and patient's adherence to plan as established by provider Reviewed medications with patient and discussed importance of compliance Encouraged to contact pcp if blood pressure consistently above 140/90 Discussed low salt diet  Dementia:  (Status:  Goal on track:  Yes.)  Long Term Goal Evaluation of current treatment plan: Dementia with anxiety Medications reviewed, encouraged to take as prescribed Advised to contact provider for new or worsening symptoms  Encouraged to continue social activities such as [facility] Encouraged to consider personal care service provider if needed.  Patient Goals/Self-Care Activities: Take medications as prescribed   Attend all scheduled provider appointments Continue to check and record blood pressure. Notify Provider if consisently out of recommended range. Fall prevention strategies: Be cautious when walking. Remove any obstacles that may increase the risk of trips and falls. Walk always in a well-lit room (use night lights in the walls). Avoid area rugs or power cords from appliances in the middle of the walkways. Use a walker at all times. Safe environment strategies: personal care assistance and supervision. Monitor the home for safety hazards: kitchen potential safety hazards- use appliances that have an automatic shut-off feature, apply stove knob covers, remove knobs or turn off gas when stove is not in use, disconnect the garbage disposal. Monitor your home for hazardous equipment/chemicals-Keep out of reach for example medications, alcohol, matches sharp objects, cleaning products, laundry pacs, bleach and other harmful chemicals. Contact [employer]'s [employer] for more information [phone] Consider Personal care service provider if  needed. Contact your nurse care management coordinator as needed for care management and/or care coordination needs  Contact your provider for any new concerns or problems   Plan: Will follow up in approximately 2 months for any additional chronic care management needs. The patient/caregiver has been provided with contact information for the care management team and has been advised to call with any health related questions or concerns. Telephone follow up appointment with care management team member scheduled for:  07/26/22  Thea Silversmith, RN, MSN, BSN, CCM Care Management Coordinator [facility] [phone]

## 2022-05-31 NOTE — Patient Instructions (Signed)
Visit Information  Thank you for taking time to visit with me today. Please don't hesitate to contact me if I can be of assistance to you before our next scheduled telephone appointment.  Following are the goals we discussed today:  Patient Goals/Self-Care Activities:  Your son has enrolled you in [place], which you will attend two days per week, Monday's and Friday's, from 8:45 am until 2:30 pm. Your son will contact LCSW directly (# [phone]), if he has questions, needs assistance, or if additional social work needs are identified in the near future.   No Follow-Up Required.    Please call the care guide team at [phone] if you need to cancel or reschedule your appointment.   If you are experiencing a Mental Health or [place] or need someone to talk to, please call the Suicide and Crisis Lifeline: 988 call the [place] National Suicide Prevention Lifeline: (224)007-0016 or TTY: 906-535-3134 TTY (410) 839-8468) to talk to a trained counselor call 1-800-273-TALK (toll free, 24 hour hotline) go to [facility] [name], [place] [phone]) call the [place]: [phone] call 911   Patient verbalizes understanding of instructions and care plan provided today and agrees to view in [place]. Active MyChart status and patient understanding of how to access instructions and care plan via MyChart confirmed with patient.     [place] Licensed Clinical Social Worker [facility] [phone]

## 2022-06-01 ENCOUNTER — Other Ambulatory Visit: Payer: Self-pay | Admitting: Family Medicine

## 2022-06-04 ENCOUNTER — Telehealth: Payer: Medicare HMO

## 2022-06-04 DIAGNOSIS — H9319 Tinnitus, unspecified ear: Secondary | ICD-10-CM | POA: Diagnosis not present

## 2022-06-04 DIAGNOSIS — E559 Vitamin D deficiency, unspecified: Secondary | ICD-10-CM | POA: Diagnosis not present

## 2022-06-04 DIAGNOSIS — R69 Illness, unspecified: Secondary | ICD-10-CM | POA: Diagnosis not present

## 2022-06-04 DIAGNOSIS — J4520 Mild intermittent asthma, uncomplicated: Secondary | ICD-10-CM | POA: Diagnosis not present

## 2022-06-04 DIAGNOSIS — M19041 Primary osteoarthritis, right hand: Secondary | ICD-10-CM | POA: Diagnosis not present

## 2022-06-04 DIAGNOSIS — D649 Anemia, unspecified: Secondary | ICD-10-CM | POA: Diagnosis not present

## 2022-06-04 DIAGNOSIS — M069 Rheumatoid arthritis, unspecified: Secondary | ICD-10-CM | POA: Diagnosis not present

## 2022-06-04 DIAGNOSIS — I10 Essential (primary) hypertension: Secondary | ICD-10-CM | POA: Diagnosis not present

## 2022-06-04 DIAGNOSIS — H60399 Other infective otitis externa, unspecified ear: Secondary | ICD-10-CM | POA: Diagnosis not present

## 2022-06-04 DIAGNOSIS — M19012 Primary osteoarthritis, left shoulder: Secondary | ICD-10-CM | POA: Diagnosis not present

## 2022-06-04 DIAGNOSIS — H6690 Otitis media, unspecified, unspecified ear: Secondary | ICD-10-CM | POA: Diagnosis not present

## 2022-06-04 DIAGNOSIS — K648 Other hemorrhoids: Secondary | ICD-10-CM | POA: Diagnosis not present

## 2022-06-04 DIAGNOSIS — G3184 Mild cognitive impairment, so stated: Secondary | ICD-10-CM | POA: Diagnosis not present

## 2022-06-04 DIAGNOSIS — K219 Gastro-esophageal reflux disease without esophagitis: Secondary | ICD-10-CM | POA: Diagnosis not present

## 2022-06-04 DIAGNOSIS — G4700 Insomnia, unspecified: Secondary | ICD-10-CM | POA: Diagnosis not present

## 2022-06-04 DIAGNOSIS — J309 Allergic rhinitis, unspecified: Secondary | ICD-10-CM | POA: Diagnosis not present

## 2022-06-04 DIAGNOSIS — K5900 Constipation, unspecified: Secondary | ICD-10-CM | POA: Diagnosis not present

## 2022-06-04 DIAGNOSIS — K439 Ventral hernia without obstruction or gangrene: Secondary | ICD-10-CM | POA: Diagnosis not present

## 2022-06-04 DIAGNOSIS — E538 Deficiency of other specified B group vitamins: Secondary | ICD-10-CM | POA: Diagnosis not present

## 2022-06-04 DIAGNOSIS — G4733 Obstructive sleep apnea (adult) (pediatric): Secondary | ICD-10-CM | POA: Diagnosis not present

## 2022-06-04 DIAGNOSIS — E782 Mixed hyperlipidemia: Secondary | ICD-10-CM | POA: Diagnosis not present

## 2022-06-07 ENCOUNTER — Other Ambulatory Visit: Payer: Self-pay | Admitting: Family Medicine

## 2022-06-07 DIAGNOSIS — J309 Allergic rhinitis, unspecified: Secondary | ICD-10-CM | POA: Diagnosis not present

## 2022-06-07 DIAGNOSIS — M069 Rheumatoid arthritis, unspecified: Secondary | ICD-10-CM | POA: Diagnosis not present

## 2022-06-07 DIAGNOSIS — J4520 Mild intermittent asthma, uncomplicated: Secondary | ICD-10-CM | POA: Diagnosis not present

## 2022-06-07 DIAGNOSIS — M19012 Primary osteoarthritis, left shoulder: Secondary | ICD-10-CM | POA: Diagnosis not present

## 2022-06-07 DIAGNOSIS — G4700 Insomnia, unspecified: Secondary | ICD-10-CM | POA: Diagnosis not present

## 2022-06-07 DIAGNOSIS — K439 Ventral hernia without obstruction or gangrene: Secondary | ICD-10-CM | POA: Diagnosis not present

## 2022-06-07 DIAGNOSIS — E559 Vitamin D deficiency, unspecified: Secondary | ICD-10-CM | POA: Diagnosis not present

## 2022-06-07 DIAGNOSIS — H6690 Otitis media, unspecified, unspecified ear: Secondary | ICD-10-CM | POA: Diagnosis not present

## 2022-06-07 DIAGNOSIS — H60399 Other infective otitis externa, unspecified ear: Secondary | ICD-10-CM | POA: Diagnosis not present

## 2022-06-07 DIAGNOSIS — G3184 Mild cognitive impairment, so stated: Secondary | ICD-10-CM | POA: Diagnosis not present

## 2022-06-07 DIAGNOSIS — I10 Essential (primary) hypertension: Secondary | ICD-10-CM | POA: Diagnosis not present

## 2022-06-07 DIAGNOSIS — E538 Deficiency of other specified B group vitamins: Secondary | ICD-10-CM | POA: Diagnosis not present

## 2022-06-07 DIAGNOSIS — K5900 Constipation, unspecified: Secondary | ICD-10-CM | POA: Diagnosis not present

## 2022-06-07 DIAGNOSIS — R69 Illness, unspecified: Secondary | ICD-10-CM | POA: Diagnosis not present

## 2022-06-07 DIAGNOSIS — G4733 Obstructive sleep apnea (adult) (pediatric): Secondary | ICD-10-CM | POA: Diagnosis not present

## 2022-06-07 DIAGNOSIS — H9319 Tinnitus, unspecified ear: Secondary | ICD-10-CM | POA: Diagnosis not present

## 2022-06-07 DIAGNOSIS — D649 Anemia, unspecified: Secondary | ICD-10-CM | POA: Diagnosis not present

## 2022-06-07 DIAGNOSIS — K648 Other hemorrhoids: Secondary | ICD-10-CM | POA: Diagnosis not present

## 2022-06-07 DIAGNOSIS — E782 Mixed hyperlipidemia: Secondary | ICD-10-CM | POA: Diagnosis not present

## 2022-06-07 DIAGNOSIS — K219 Gastro-esophageal reflux disease without esophagitis: Secondary | ICD-10-CM | POA: Diagnosis not present

## 2022-06-07 DIAGNOSIS — M19041 Primary osteoarthritis, right hand: Secondary | ICD-10-CM | POA: Diagnosis not present

## 2022-06-11 DIAGNOSIS — I10 Essential (primary) hypertension: Secondary | ICD-10-CM

## 2022-06-11 DIAGNOSIS — R69 Illness, unspecified: Secondary | ICD-10-CM | POA: Diagnosis not present

## 2022-06-11 DIAGNOSIS — F0390 Unspecified dementia without behavioral disturbance: Secondary | ICD-10-CM

## 2022-06-18 ENCOUNTER — Other Ambulatory Visit: Payer: Self-pay | Admitting: Family Medicine

## 2022-06-18 DIAGNOSIS — I10 Essential (primary) hypertension: Secondary | ICD-10-CM

## 2022-06-26 ENCOUNTER — Other Ambulatory Visit: Payer: Self-pay | Admitting: Family Medicine

## 2022-06-26 DIAGNOSIS — J302 Other seasonal allergic rhinitis: Secondary | ICD-10-CM

## 2022-06-26 DIAGNOSIS — R059 Cough, unspecified: Secondary | ICD-10-CM

## 2022-06-26 DIAGNOSIS — K219 Gastro-esophageal reflux disease without esophagitis: Secondary | ICD-10-CM

## 2022-07-03 ENCOUNTER — Other Ambulatory Visit: Payer: Self-pay | Admitting: Family Medicine

## 2022-07-07 ENCOUNTER — Other Ambulatory Visit: Payer: Self-pay | Admitting: Family Medicine

## 2022-07-11 NOTE — Assessment & Plan Note (Signed)
Becoming more dependent on family support

## 2022-07-11 NOTE — Assessment & Plan Note (Signed)
Benefits from CPAP when used. Family support recognized. Plan- continue auto 5-15

## 2022-07-14 ENCOUNTER — Ambulatory Visit (INDEPENDENT_AMBULATORY_CARE_PROVIDER_SITE_OTHER): Payer: Medicare HMO

## 2022-07-14 DIAGNOSIS — I10 Essential (primary) hypertension: Secondary | ICD-10-CM

## 2022-07-14 DIAGNOSIS — F0394 Unspecified dementia, unspecified severity, with anxiety: Secondary | ICD-10-CM

## 2022-07-14 NOTE — Patient Instructions (Signed)
Visit Information  Thank you for allowing me to share the care management and care coordination services that are available to you as part of your health plan and services through your primary care provider and medical home. Please reach out to your primary care provider or me at [phone] if the care management/care coordination team may be of assistance to you in the future.   Mikayah Joy, RN, MSN, BSN, CCM Care Management Coordinator [facility] [phone]  

## 2022-07-14 NOTE — Chronic Care Management (AMB) (Addendum)
Chronic Care Management   CCM RN Visit Note  07/14/2022 Name: Jackie Stevens MRN: [id number] DOB: 12-18-1945  Subjective: Jackie Stevens is a 76 y.o. year old female who is a primary care patient of Mosie Lukes, MD. The care management team was consulted for assistance with disease management and care coordination needs.    Engaged with patient by telephone for follow up visit in response to provider referral for case management and/or care coordination services.   Consent to Services:  The patient was given information about Chronic Care Management services, agreed to services, and gave verbal consent prior to initiation of services.  Please see initial visit note for detailed documentation.   Patient agreed to services and verbal consent obtained.   Assessment: Review of patient past medical history, allergies, medications, health status, including review of consultants reports, laboratory and other test data, was performed as part of comprehensive evaluation and provision of chronic care management services.   SDOH (Social Determinants of Health) assessments and interventions performed:    CCM Care Plan  Allergies  Allergen Reactions   Neomycin-Bacitracin Zn-Polymyx Rash    Polysporin- is tolerated    Niacin Other (See Comments) and Cough    "cough til I threw up" (12/19/2012)   Ciprofloxacin Hives    Got cipro and flagyl at same time, localized hives to IV arm   Flagyl [Metronidazole] Hives    Got cipro and flagyl at same time, localized hives to IV arm    Outpatient Encounter Medications as of 07/14/2022  Medication Sig Note   amLODipine (NORVASC) 2.5 MG tablet TAKE 1 TABLET BY MOUTH EVERY DAY    Azelastine HCl 137 MCG/SPRAY SOLN PLACE 2 SPRAYS INTO BOTH NOSTRILS 2 (TWO) TIMES DAILY.    benzonatate (TESSALON) 100 MG capsule TAKE 1-2 CAPSULES BY MOUTH 3 TIMES DAILY AS NEEDED FOR COUGH    Black Pepper-Turmeric (TURMERIC CURCUMIN) 04-999 MG CAPS 1 tablet daily.     carboxymethylcellulose (REFRESH PLUS) 0.5 % SOLN Place 1 drop into both eyes 3 (three) times daily as needed (dry eyes). 01/01/2021: [employer]    Cholecalciferol-Vitamin C (VITAMIN D3-VITAMIN C) 1000-500 UNIT-MG CAPS daily.    docusate sodium (COLACE) 100 MG capsule Take 100 mg by mouth daily.    donepezil (ARICEPT) 10 MG tablet Take one tab daily    famotidine (PEPCID) 20 MG tablet TAKE 2 TABLETS BY MOUTH EVERY DAY    FLUoxetine (PROZAC) 10 MG tablet TAKE 1 TABLET BY MOUTH EVERY DAY    fluticasone (FLONASE) 50 MCG/ACT nasal spray PRN (seasonal)    gabapentin (NEURONTIN) 300 MG capsule Take 1 capsule (300 mg total) by mouth at bedtime.    hydrochlorothiazide (HYDRODIURIL) 25 MG tablet TAKE 1 TABLET (25 MG TOTAL) BY MOUTH DAILY.    levothyroxine (SYNTHROID) 112 MCG tablet Take 1 tablet (112 mcg total) by mouth daily before breakfast.    losartan (COZAAR) 100 MG tablet TAKE 1 TABLET BY MOUTH EVERYDAY AT BEDTIME    metoprolol tartrate (LOPRESSOR) 100 MG tablet TAKE 1&1/2 TABLETS BY MOUTH TWICE A DAY    naproxen (NAPROSYN) 375 MG tablet TAKE 1 TABLET (375 MG TOTAL) BY MOUTH 2 (TWO) TIMES DAILY AS NEEDED FOR MODERATE PAIN OR HEADACHE.    nitroGLYCERIN (NITROSTAT) 0.4 MG SL tablet PLACE 1 TABLET UNDER THE TONGUE EVERY 5 MINUTES AS NEEDED FOR CHEST PAIN 01/19/2021: Not needed.    omeprazole (PRILOSEC) 40 MG capsule TAKE 1 CAPSULE BY MOUTH TWICE A DAY    polyethylene  glycol (MIRALAX / GLYCOLAX) 17 g packet Take 17 g by mouth daily.    Psyllium-Calcium (METAMUCIL PLUS CALCIUM) CAPS daily.    rosuvastatin (CRESTOR) 40 MG tablet TAKE 1 TABLET BY MOUTH EVERY DAY    temazepam (RESTORIL) 7.5 MG capsule 1 po every other day for 1 week, then every third day for 1 week. 04/30/2022: Reports takes every third day   No facility-administered encounter medications on file as of 07/14/2022.    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[place]) 02/05/2020   Hyperglycemia 01/17/2020   Vertigo 08/05/2019   Palpitations 05/28/2019   Other fatigue 02/28/2018   Shortness of breath on exertion 02/28/2018   Vitamin D deficiency 02/28/2018   Obesity 09/11/2017   Dermatitis 03/06/2017   Arthritis of left shoulder region 09/30/2016   Pain of joint of left ankle and foot 09/15/2016   Left shoulder pain 09/14/2016   Preventative health care 02/26/2016   Radicular pain of sacrum 02/26/2016   Vitamin B12 deficiency 02/26/2016   Ventral hernia 12/02/2015   Pelvic mass in female 12/02/2015   Colitis 09/14/2015   Paresthesia 03/23/2015   Hyponatremia 03/13/2015   Headache 08/21/2014   Low back pain 06/09/2014   Anemia 10/06/2013   Medicare annual wellness visit, subsequent 10/06/2013   Asthma, mild intermittent 04/02/2013   Internal hemorrhoid, bleeding 01/23/2013   Diverticulitis of rectosigmoid 11/28/2012   Colonic diverticular abscess 11/21/2012   Abnormal cervical cytology 10/25/2012   Cancer ([place])    Insomnia    Hypothyroidism    Constipation    Cough 09/02/2012   Chest pain 08/05/2011   Hyperlipidemia, mixed 11/08/2010   Essential hypertension 11/08/2010   GERD (gastroesophageal reflux disease) 11/08/2010   Obstructive sleep apnea 11/04/2010    Conditions to be addressed/monitored:HTN and Dementia  Care Plan : RN Care Manager Plan of Care  Updates made by Luretha Rued, RN since 07/14/2022 12:00 AM  Completed 07/14/2022   Problem: No Plan of Care Established for Management of Chronic Disease and/or  Care Coordination needs Resolved 07/14/2022  Priority: High     Long-Range Goal: Development of Plan of Care for Chronic Disease Management and/or Care Coordination needs Completed 07/14/2022  Start Date: 04/30/2022  Expected End Date: 10/31/2022  Priority: High  Note:   Current Barriers: 07/14/22 RNCM spoke with  patient's son, Jackie Stevens (son/primary caregiver), who reports that he has all the resources needed at this time. He reports patient is attending [facility] day program 2 days/week and enjoying it. He denies any additional care management or care coordination needs at this time. He denies any health questions or concerns this time. Case closure discussed and Mr. Zani is agreeable. RNCM encouraged Mr. Arons to contact primary care provider if care management or care coordination needs in the future.  Care Coordination needs related to Limited social support, Memory Deficits, and Lacks knowledge of community resource: Adult day programs; personal care service organizations, home modification resources  Chronic Disease Management support and education needs related to HTN and Dementia  RNCM Clinical Goal(s):  Patient will verbalize understanding of plan for management of HTN and Dementia as evidenced by patient/caregiver report attend all scheduled medical appointments:   as evidenced by chart review and/or patient/caregiver report        work with community  resource care guide to address needs related to Limited social support and Memory Deficits as evidenced by patient and/or community resource care guide support    through collaboration with Consulting civil engineer, provider, and care team.   Interventions: 1:1 collaboration with primary care provider regarding development and update of comprehensive plan of care as evidenced by provider attestation and co-signature Inter-disciplinary care team collaboration (see longitudinal plan of care) Evaluation of current treatment plan related to  self management and patient's adherence to plan as established by provider  Hypertension Interventions:  (Status:  Goal Met.) Long Term Goal Last practice recorded BP readings:  BP Readings from Last 3 Encounters:  05/31/22 130/72  05/18/22 138/86  05/03/22 (!) 183/87  Most recent eGFR/CrCl: No results found  for: EGFR  No components found for: CRCL  Evaluation of current treatment plan related to hypertension self management and patient's adherence to plan as established by provider Reviewed medications with patient and discussed importance of compliance Encouraged to contact pcp if blood pressure consistently above 140/90 Discussed low salt diet  Dementia:  (Status:  Goal Met.)  Long Term Goal Son reports managed at this time. Attending adult day program two days/week Evaluation of current treatment plan: Dementia with anxiety Medications reviewed, encouraged to take as prescribed Advised to contact provider for new or worsening symptoms  Encouraged to continue social activities such as [facility] Encouraged to consider personal care service provider if needed.  Patient Goals/Self-Care Activities: Take medications as prescribed   Attend all scheduled provider appointments Continue to check and record blood pressure. Notify Provider if consisently out of recommended range. Fall prevention strategies: Be cautious when walking. Remove any obstacles that may increase the risk of trips and falls. Walk always in a well-lit room (use night lights in the walls). Avoid area rugs or power cords from appliances in the middle of the walkways. Use a walker at all times. Safe environment strategies: personal care assistance and supervision. Monitor the home for safety hazards: kitchen potential safety hazards- use appliances that have an automatic shut-off feature, apply stove knob covers, remove knobs or turn off gas when stove is not in use, disconnect the garbage disposal. Monitor your home for hazardous equipment/chemicals-Keep out of reach for example medications, alcohol, matches sharp objects, cleaning products, laundry pacs, bleach and other harmful chemicals. Contact [employer] for more information [phone] Consider Personal care service provider if needed. Contact your nurse care  management coordinator as needed for care management and/or care coordination needs  Contact your provider for any new concerns or problems   Plan:No further follow up required: Caregiver encouraged to contact primary care provider for care management and/or care coordination needs in the future.  Thea Silversmith, RN, MSN, BSN, CCM Care Management Coordinator [facility] [phone]

## 2022-07-16 ENCOUNTER — Other Ambulatory Visit: Payer: Self-pay | Admitting: Family Medicine

## 2022-07-19 ENCOUNTER — Other Ambulatory Visit: Payer: Self-pay | Admitting: Family Medicine

## 2022-07-20 ENCOUNTER — Encounter: Payer: Self-pay | Admitting: Family Medicine

## 2022-07-20 ENCOUNTER — Other Ambulatory Visit (INDEPENDENT_AMBULATORY_CARE_PROVIDER_SITE_OTHER): Payer: Medicare HMO

## 2022-07-20 ENCOUNTER — Ambulatory Visit (INDEPENDENT_AMBULATORY_CARE_PROVIDER_SITE_OTHER): Payer: Medicare HMO | Admitting: Family Medicine

## 2022-07-20 ENCOUNTER — Other Ambulatory Visit: Payer: Self-pay | Admitting: Family Medicine

## 2022-07-20 VITALS — BP 117/37 | HR 58

## 2022-07-20 DIAGNOSIS — R739 Hyperglycemia, unspecified: Secondary | ICD-10-CM | POA: Diagnosis not present

## 2022-07-20 DIAGNOSIS — E038 Other specified hypothyroidism: Secondary | ICD-10-CM | POA: Diagnosis not present

## 2022-07-20 DIAGNOSIS — I10 Essential (primary) hypertension: Secondary | ICD-10-CM | POA: Diagnosis not present

## 2022-07-20 DIAGNOSIS — E559 Vitamin D deficiency, unspecified: Secondary | ICD-10-CM | POA: Diagnosis not present

## 2022-07-20 DIAGNOSIS — R350 Frequency of micturition: Secondary | ICD-10-CM | POA: Diagnosis not present

## 2022-07-20 DIAGNOSIS — E782 Mixed hyperlipidemia: Secondary | ICD-10-CM | POA: Diagnosis not present

## 2022-07-20 DIAGNOSIS — R3915 Urgency of urination: Secondary | ICD-10-CM

## 2022-07-20 DIAGNOSIS — E538 Deficiency of other specified B group vitamins: Secondary | ICD-10-CM

## 2022-07-20 LAB — URINALYSIS, ROUTINE W REFLEX MICROSCOPIC
Bilirubin Urine: NEGATIVE
Hgb urine dipstick: NEGATIVE
Ketones, ur: NEGATIVE
Nitrite: NEGATIVE
Specific Gravity, Urine: 1.025 (ref 1.000–1.030)
Urine Glucose: NEGATIVE
Urobilinogen, UA: 0.2 (ref 0.0–1.0)
pH: 6.5 (ref 5.0–8.0)

## 2022-07-20 LAB — CBC
HCT: 36.7 % (ref 36.0–46.0)
Hemoglobin: 12.3 g/dL (ref 12.0–15.0)
MCHC: 33.6 g/dL (ref 30.0–36.0)
MCV: 89.1 fl (ref 78.0–100.0)
Platelets: 285 10*3/uL (ref 150.0–400.0)
RBC: 4.12 Mil/uL (ref 3.87–5.11)
RDW: 15 % (ref 11.5–15.5)
WBC: 7.4 10*3/uL (ref 4.0–10.5)

## 2022-07-20 LAB — COMPREHENSIVE METABOLIC PANEL
ALT: 18 U/L (ref 0–35)
AST: 19 U/L (ref 0–37)
Albumin: 4.1 g/dL (ref 3.5–5.2)
Alkaline Phosphatase: 85 U/L (ref 39–117)
BUN: 21 mg/dL (ref 6–23)
CO2: 32 mEq/L (ref 19–32)
Calcium: 8.7 mg/dL (ref 8.4–10.5)
Chloride: 94 mEq/L — ABNORMAL LOW (ref 96–112)
Creatinine, Ser: 0.82 mg/dL (ref 0.40–1.20)
GFR: 68.74 mL/min (ref >60.00)
Glucose, Bld: 90 mg/dL (ref 70–99)
Potassium: 4.3 mEq/L (ref 3.5–5.1)
Sodium: 134 mEq/L — ABNORMAL LOW (ref 135–145)
Total Bilirubin: 0.4 mg/dL (ref 0.2–1.2)
Total Protein: 6.3 g/dL (ref 6.0–8.3)

## 2022-07-20 LAB — HEMOGLOBIN A1C: Hgb A1c MFr Bld: 5.8 % (ref 4.6–6.5)

## 2022-07-20 LAB — LIPID PANEL
Cholesterol: 143 mg/dL (ref 0–200)
HDL: 60.6 mg/dL (ref >39.00)
LDL Cholesterol: 64 mg/dL (ref 0–99)
NonHDL: 82.09
Total CHOL/HDL Ratio: 2
Triglycerides: 88 mg/dL (ref 0.0–149.0)
VLDL: 17.6 mg/dL (ref 0.0–40.0)

## 2022-07-20 LAB — VITAMIN B12: Vitamin B-12: 555 pg/mL (ref 211–911)

## 2022-07-20 LAB — T4, FREE: Free T4: 1.46 ng/dL (ref 0.60–1.60)

## 2022-07-20 LAB — TSH: TSH: 8.78 u[IU]/mL — ABNORMAL HIGH (ref 0.35–5.50)

## 2022-07-20 LAB — VITAMIN D 25 HYDROXY (VIT D DEFICIENCY, FRACTURES): VITD: 67.13 ng/mL (ref 30.00–100.00)

## 2022-07-20 NOTE — Progress Notes (Signed)
   Acute Office Visit  Subjective:     Patient ID: Jackie Stevens, female    DOB: Sep 08, 1946, 76 y.o.   MRN: [id number]  CC: UTI symptoms for 1-2 weeks   Urinary Tract Infection  This is a new problem. The current episode started 1 to 4 weeks ago. The problem occurs every urination. The problem has been unchanged. The quality of the pain is described as burning. The pain is at a severity of 0/10. The patient is experiencing no pain. There has been no fever. Associated symptoms include frequency, hesitancy and urgency. Pertinent negatives include no chills, discharge, flank pain, hematuria, nausea, possible pregnancy, sweats or vomiting. She has tried increased fluids ((tried something OTC, can't remember what)) for the symptoms. The treatment provided no relief.    ROS: All review of systems negative except what is listed in the HPI      Objective:    BP (!) 117/37   Pulse (!) 58    Physical Exam Vitals reviewed.  Constitutional:      Appearance: Normal appearance.  Cardiovascular:     Rate and Rhythm: Normal rate and regular rhythm.  Pulmonary:     Effort: Pulmonary effort is normal.     Breath sounds: Normal breath sounds.  Abdominal:     Tenderness: There is no right CVA tenderness or left CVA tenderness.  Neurological:     General: No focal deficit present.     Mental Status: She is alert and oriented to person, place, and time. Mental status is at baseline.  Psychiatric:        Mood and Affect: Mood normal.        Behavior: Behavior normal.        Thought Content: Thought content normal.        Judgment: Judgment normal.        No results found for any visits on 07/20/22.      Assessment & Plan:   1. Urinary frequency Urine sample sent to the lab. We will let you know results and plan. In the meantime, continue staying well hydrated, avoid bladder irritants (caffeine, chocolate, citrus, spicy foods, etc). You can try cranberry supplements as well.    Please contact office for follow-up if symptoms do not improve or worsen. Seek emergency care if symptoms become severe.   Return if symptoms worsen or fail to improve.  Terrilyn Saver, NP

## 2022-07-20 NOTE — Patient Instructions (Signed)
Urine sample sent to the lab. We will let you know results and plan. In the meantime, continue staying well hydrated, avoid bladder irritants (caffeine, chocolate, citrus, spicy foods, etc). You can try cranberry supplements as well.   Please contact office for follow-up if symptoms do not improve or worsen. Seek emergency care if symptoms become severe.

## 2022-07-21 LAB — URINE CULTURE
MICRO NUMBER:: 13750251
SPECIMEN QUALITY:: ADEQUATE

## 2022-07-23 ENCOUNTER — Telehealth: Payer: Self-pay | Admitting: Family Medicine

## 2022-07-23 ENCOUNTER — Other Ambulatory Visit: Payer: Self-pay

## 2022-07-23 DIAGNOSIS — R350 Frequency of micturition: Secondary | ICD-10-CM

## 2022-07-23 MED ORDER — CEFDINIR 300 MG PO CAPS: 300.0000 mg | ORAL_CAPSULE | Freq: Every day | ORAL | 0 refills | Status: AC

## 2022-07-23 NOTE — Telephone Encounter (Signed)
Called pt sent Rx

## 2022-07-23 NOTE — Telephone Encounter (Signed)
Laurey Arrow (son) called stating they the pharmacy still doesn't have the new Rx per the following Lab result conversation (8.8.23) for the pt:   Laurey Arrow would like for [place] to look into sending the new dosage when possible so she can start the medication to the following pharmacy:  [id number]- [place], [place] - [address]  [id number] Phone:  [phone] Fax:  [phone]

## 2022-07-26 ENCOUNTER — Telehealth: Payer: Medicare HMO

## 2022-07-29 ENCOUNTER — Telehealth: Payer: Medicare HMO

## 2022-07-30 ENCOUNTER — Encounter: Payer: Self-pay | Admitting: Family

## 2022-07-30 ENCOUNTER — Ambulatory Visit (INDEPENDENT_AMBULATORY_CARE_PROVIDER_SITE_OTHER): Payer: Medicare HMO | Admitting: Family

## 2022-07-30 VITALS — BP 180/80 | HR 64 | Temp 98.1°F | Ht 62.0 in | Wt 179.0 lb

## 2022-07-30 DIAGNOSIS — N390 Urinary tract infection, site not specified: Secondary | ICD-10-CM

## 2022-07-30 LAB — POCT URINALYSIS DIP (MANUAL ENTRY)
Bilirubin, UA: NEGATIVE
Blood, UA: NEGATIVE
Glucose, UA: NEGATIVE mg/dL
Ketones, POC UA: NEGATIVE mg/dL
Leukocytes, UA: NEGATIVE
Nitrite, UA: NEGATIVE
Protein Ur, POC: NEGATIVE mg/dL
Spec Grav, UA: 1.025 (ref 1.010–1.025)
Urobilinogen, UA: 0.2 E.U./dL
pH, UA: 6 (ref 5.0–8.0)

## 2022-07-30 MED ORDER — FLUCONAZOLE 150 MG PO TABS: ORAL_TABLET | ORAL | 0 refills | Status: DC

## 2022-07-30 NOTE — Progress Notes (Signed)
Jackie Stevens is a 76 y.o. 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[place]) 02/05/2020   Hyperglycemia 01/17/2020   Vertigo 08/05/2019   Palpitations 05/28/2019   Other fatigue 02/28/2018   Shortness of breath on exertion 02/28/2018   Vitamin D deficiency 02/28/2018   Obesity 09/11/2017   Dermatitis 03/06/2017   Arthritis of left shoulder region 09/30/2016   Pain of joint of left ankle and foot 09/15/2016   Left shoulder pain 09/14/2016   Preventative health care 02/26/2016   Radicular pain of sacrum 02/26/2016   Vitamin B12 deficiency 02/26/2016   Ventral hernia 12/02/2015   Pelvic mass in female 12/02/2015   Colitis 09/14/2015   Paresthesia 03/23/2015   Hyponatremia 03/13/2015   Headache 08/21/2014   Low back pain 06/09/2014   Anemia 10/06/2013   Medicare annual wellness visit, subsequent 10/06/2013   Asthma, mild intermittent 04/02/2013   Internal hemorrhoid, bleeding 01/23/2013   Diverticulitis of rectosigmoid 11/28/2012   Colonic diverticular abscess 11/21/2012   Abnormal cervical cytology 10/25/2012   Cancer ([place])    Insomnia    Hypothyroidism    Constipation    Cough 09/02/2012   Chest pain 08/05/2011   Hyperlipidemia, mixed 11/08/2010   Essential hypertension 11/08/2010   GERD (gastroesophageal reflux disease) 11/08/2010   Obstructive sleep apnea 11/04/2010    Current Outpatient Medications  Medication Sig Dispense Refill   amLODipine (NORVASC) 2.5 MG tablet TAKE 1 TABLET BY MOUTH EVERY DAY 90 tablet 1   Azelastine HCl 137 MCG/SPRAY SOLN PLACE 2 SPRAYS INTO BOTH NOSTRILS 2 (TWO) TIMES DAILY. 90 mL 4   Black Pepper-Turmeric (TURMERIC CURCUMIN) 04-999 MG CAPS 1 tablet daily.     carboxymethylcellulose (REFRESH PLUS) 0.5 %  SOLN Place 1 drop into both eyes 3 (three) times daily as needed (dry eyes).     Cholecalciferol-Vitamin C (VITAMIN D3-VITAMIN C) 1000-500 UNIT-MG CAPS daily.     docusate sodium (COLACE) 100 MG capsule Take 100 mg by mouth daily.     donepezil (ARICEPT) 10 MG tablet Take one tab daily 30 tablet 11   famotidine (PEPCID) 20 MG tablet TAKE 2 TABLETS BY MOUTH EVERY DAY 180 tablet 1   fluconazole (DIFLUCAN) 150 MG tablet Take today as directed; repeat after 72 hours 2 tablet 0   FLUoxetine (PROZAC) 10 MG tablet TAKE 1 TABLET BY MOUTH EVERY DAY 90 tablet 2   gabapentin (NEURONTIN) 300 MG capsule Take 1 capsule (300 mg total) by mouth at bedtime. 30 capsule 1   hydrochlorothiazide (HYDRODIURIL) 25 MG tablet TAKE 1 TABLET (25 MG TOTAL) BY MOUTH DAILY. 90 tablet 1   levothyroxine (SYNTHROID) 112 MCG tablet Take 1 tablet (112 mcg total) by mouth daily before breakfast. 90 tablet 1   losartan (COZAAR) 100 MG tablet TAKE 1 TABLET BY MOUTH EVERYDAY AT BEDTIME 90 tablet 1   metoprolol tartrate (LOPRESSOR) 100 MG tablet TAKE 1&1/2 TABLETS BY MOUTH TWICE A DAY 270 tablet 1   naproxen (NAPROSYN) 375 MG tablet TAKE 1 TABLET (375 MG TOTAL) BY MOUTH 2 (TWO) TIMES DAILY AS NEEDED FOR MODERATE PAIN OR HEADACHE. 60 tablet 3   nitroGLYCERIN (NITROSTAT) 0.4 MG SL tablet PLACE 1 TABLET UNDER THE TONGUE EVERY 5 MINUTES AS NEEDED FOR CHEST PAIN 25 tablet 1  omeprazole (PRILOSEC) 40 MG capsule TAKE 1 CAPSULE BY MOUTH TWICE A DAY 180 capsule 1   polyethylene glycol (MIRALAX / GLYCOLAX) 17 g packet Take 17 g by mouth daily.     Psyllium-Calcium (METAMUCIL PLUS CALCIUM) CAPS daily.     rosuvastatin (CRESTOR) 40 MG tablet TAKE 1 TABLET BY MOUTH EVERY DAY 90 tablet 1   temazepam (RESTORIL) 7.5 MG capsule 1 po every other day for 1 week, then every third day for 1 week. 7 capsule 0   No current facility-administered medications for this visit.    Allergies: Neomycin-bacitracin zn-polymyx, Niacin, Ciprofloxacin, and Flagyl  [metronidazole]  Past Medical History:  Diagnosis Date   Abnormal cervical cytology 10/25/2012   Follows with Dr Leavy Cella of Gyn   Allergic rhinitis 11/04/2010   Qualifier: Diagnosis of  By: Annamaria Boots MD, Clinton D    Anemia 10/06/2013   Anginal pain    pt has history of CP states had cardiac workup with no specific issues identified    Arthritis    knees; right thumb; shoulders   Arthritis of left shoulder region 09/30/2016   X-ray of the left shoulder on 09/14/2016: Marked degenerative change with significant osteophytic spurring and subchondral sclerosis.  Loss of glenohumeral joint space.  Well-maintained subacromial space.  Injected 09/30/2016 [name] .   Asthma, mild intermittent 04/02/2013   Chicken pox as a child   Colonic diverticular abscess 11/21/2012   Constipation    Decreased hearing    Dermatitis 03/06/2017   Diverticulitis 10/25/2012   pt. reports that a drain was placed - 09/2012     Diverticulitis of rectosigmoid 11/28/2012   Dry mouth    Essential hypertension 11/08/2010   Qualifier: Diagnosis of  By: Annamaria Boots MD, Clinton D    Excessive thirst    External hemorrhoid, bleeding    "sometimes" (01/18/2013)   Frequent urination    GERD (gastroesophageal reflux disease)    H/O hiatal hernia    Hand tingling    Heart murmur    History of kidney stones    Hyperglycemia 01/17/2020   Hyperlipidemia    Hypothyroidism    Incontinence    Insomnia    Kidney stones 1970's   "passed on their own" (18-Jan-2013)   Low back pain 06/09/2014   Measles as a child   Mild neurocognitive disorder 02/05/2020   Obesity 09/11/2017   Obstructive sleep apnea 11/04/2010   NPSG [facility] 07/15/10- AHI 13.5/ hr CPAP 10/APS     Otitis media 01/07/2014   Palpitations    Paresthesia 03/23/2015   Left face   PONV (postoperative nausea and vomiting)    Rheumatoid arteritis    Shortness of breath dyspnea    using stairs   Sinus pain    Swelling of both lower extremities    Thyroid cancer 1980's    Tinnitus    UTI (urinary tract infection) 04/02/2013   Vertigo 08/05/2019   Vitamin D deficiency 02/28/2018    Past Surgical History:  Procedure Laterality Date   CHOLECYSTECTOMY  1990   COLON SURGERY     COLOSTOMY REVISION  2013-01-18   Procedure: COLON RESECTION SIGMOID;  Surgeon: Gwenyth Ober, MD;  Location: [facility];  Service: General;  Laterality: N/A;   CYSTOSCOPY WITH STENT PLACEMENT  18-Jan-2013   Procedure: CYSTOSCOPY WITH STENT PLACEMENT;  Surgeon: Hanley Ben, MD;  Location: [facility];  Service: Urology;  Laterality: N/A;   DILATION AND CURETTAGE OF UTERUS  1960's   "lots of them; had miscarriages" (  12/19/2012)   LYSIS OF ADHESION N/A 12/02/2015   Procedure: LAPAROSCOPIC LYSIS OF ADHESION;  Surgeon: Johnathan Hausen, MD;  Location: [facility];  Service: General;  Laterality: N/A;   ROBOTIC ASSISTED BILATERAL SALPINGO OOPHERECTOMY Bilateral 12/02/2015   Procedure: XI ROBOTIC ASSISTED BILATERAL SALPINGO OOPHORECTOMY;  Surgeon: Everitt Amber, MD;  Location: [facility];  Service: Gynecology;  Laterality: Bilateral;   SIGMOID RESECTION / RECTOPEXY  12/19/2012   THYROIDECTOMY, PARTIAL  1988   "then did iodine to remove the rest" (12/19/2012)   TONSILLECTOMY  1951?   TOTAL KNEE ARTHROPLASTY Left 01/19/2021   Procedure: TOTAL KNEE ARTHROPLASTY;  Surgeon: Gaynelle Arabian, MD;  Location: [facility];  Service: Orthopedics;  Laterality: Left;  82mn   TRANSRECTAL DRAINAGE OF PELVIC ABSCESS  10/27/2012   VAGINAL HYSTERECTOMY  1970's   "still have my ovaries" (12/19/2012)    Family History  Problem Relation Age of Onset   Heart disease Father    Pneumonia Father    Hypertension Father    Hyperlipidemia Father    Cancer Father        skin   Stroke Father    Alzheimer's disease Mother    Heart disease Mother    Depression Mother    Emphysema Brother        marijuana and cigarettes   Alcohol abuse Brother    Hearing loss Brother    Diabetes Maternal Grandmother    Alzheimer's disease Paternal Grandmother     Cancer Paternal Grandmother        lung?- smoker   Hyperlipidemia Paternal Grandmother    Heart attack Paternal Grandfather    Alcohol abuse Paternal Grandfather    Neurofibromatosis Son        schwanomatosis   Neurofibromatosis Son        swanomatosis   Cancer Paternal Aunt     Social History   Tobacco Use   Smoking status: Never    Passive exposure: Never   Smokeless tobacco: Never   Tobacco comments:    Verified by SChristena Flake Substance Use Topics   Alcohol use: No    Alcohol/week: 0.0 standard drinks of alcohol    Subjective:   Accompanied by son; was seen for possible UTI on 8/8- urine culture was + and just completed 5 days of Cefdinir; still complaining of discomfort with urination; no blood in urine; no fever;     Objective:  Vitals:   07/30/22 1554  BP: (!) 180/80  Pulse: 64  Temp: 98.1 F (36.7 C)  TempSrc: Oral  SpO2: 99%  Weight: 179 lb (81.2 kg)  Height: '5\' 2"'$  (1.575 m)    General: Well developed, well nourished, in no acute distress  Skin : Warm and dry.  Head: Normocephalic and atraumatic  Lungs: Respirations unlabored; clear to auscultation bilaterally without wheeze, rales, rhonchi  CVS exam: normal rate and regular rhythm.  Neurologic: Alert and oriented; speech intact; face symmetrical; uses rolling walker;   Assessment:  1. Recurrent UTI     Plan:  Check U/A and urine culture; ? Vaginal source- will treat with Diflucan; follow up to be determined.   No follow-ups on file.  Orders Placed This Encounter  Procedures   Urine Culture   POCT urinalysis dipstick    Requested Prescriptions   Signed Prescriptions Disp Refills   fluconazole (DIFLUCAN) 150 MG tablet 2 tablet 0    Sig: Take today as directed; repeat after 72 hours

## 2022-08-01 LAB — URINE CULTURE
MICRO NUMBER:: 13800027
SPECIMEN QUALITY:: ADEQUATE

## 2022-08-12 DIAGNOSIS — R69 Illness, unspecified: Secondary | ICD-10-CM | POA: Diagnosis not present

## 2022-08-12 DIAGNOSIS — F0390 Unspecified dementia without behavioral disturbance: Secondary | ICD-10-CM

## 2022-08-12 DIAGNOSIS — I10 Essential (primary) hypertension: Secondary | ICD-10-CM

## 2022-08-17 DIAGNOSIS — G4733 Obstructive sleep apnea (adult) (pediatric): Secondary | ICD-10-CM | POA: Diagnosis not present

## 2022-08-24 ENCOUNTER — Ambulatory Visit: Payer: Medicare HMO | Admitting: Family Medicine

## 2022-08-30 DIAGNOSIS — R7302 Impaired glucose tolerance (oral): Secondary | ICD-10-CM | POA: Diagnosis not present

## 2022-08-30 DIAGNOSIS — G301 Alzheimer's disease with late onset: Secondary | ICD-10-CM | POA: Diagnosis not present

## 2022-08-30 DIAGNOSIS — E871 Hypo-osmolality and hyponatremia: Secondary | ICD-10-CM | POA: Diagnosis not present

## 2022-08-30 DIAGNOSIS — G4730 Sleep apnea, unspecified: Secondary | ICD-10-CM | POA: Diagnosis not present

## 2022-08-30 DIAGNOSIS — I700 Atherosclerosis of aorta: Secondary | ICD-10-CM | POA: Diagnosis not present

## 2022-08-30 DIAGNOSIS — I6523 Occlusion and stenosis of bilateral carotid arteries: Secondary | ICD-10-CM | POA: Diagnosis not present

## 2022-08-30 DIAGNOSIS — E785 Hyperlipidemia, unspecified: Secondary | ICD-10-CM | POA: Diagnosis not present

## 2022-08-30 DIAGNOSIS — E890 Postprocedural hypothyroidism: Secondary | ICD-10-CM | POA: Diagnosis not present

## 2022-08-30 DIAGNOSIS — I10 Essential (primary) hypertension: Secondary | ICD-10-CM | POA: Diagnosis not present

## 2022-08-30 DIAGNOSIS — C73 Malignant neoplasm of thyroid gland: Secondary | ICD-10-CM | POA: Diagnosis not present

## 2022-08-31 ENCOUNTER — Ambulatory Visit (INDEPENDENT_AMBULATORY_CARE_PROVIDER_SITE_OTHER): Payer: Medicare HMO | Admitting: Family

## 2022-08-31 VITALS — BP 135/53 | HR 53 | Temp 97.8°F | Resp 16 | Wt 178.0 lb

## 2022-08-31 DIAGNOSIS — K219 Gastro-esophageal reflux disease without esophagitis: Secondary | ICD-10-CM | POA: Diagnosis not present

## 2022-08-31 DIAGNOSIS — F0394 Unspecified dementia, unspecified severity, with anxiety: Secondary | ICD-10-CM

## 2022-08-31 DIAGNOSIS — R011 Cardiac murmur, unspecified: Secondary | ICD-10-CM | POA: Diagnosis not present

## 2022-08-31 DIAGNOSIS — E782 Mixed hyperlipidemia: Secondary | ICD-10-CM | POA: Diagnosis not present

## 2022-08-31 DIAGNOSIS — J4520 Mild intermittent asthma, uncomplicated: Secondary | ICD-10-CM | POA: Diagnosis not present

## 2022-08-31 DIAGNOSIS — R739 Hyperglycemia, unspecified: Secondary | ICD-10-CM

## 2022-08-31 DIAGNOSIS — K5900 Constipation, unspecified: Secondary | ICD-10-CM | POA: Diagnosis not present

## 2022-08-31 DIAGNOSIS — I10 Essential (primary) hypertension: Secondary | ICD-10-CM | POA: Diagnosis not present

## 2022-08-31 DIAGNOSIS — E039 Hypothyroidism, unspecified: Secondary | ICD-10-CM | POA: Diagnosis not present

## 2022-08-31 DIAGNOSIS — G4733 Obstructive sleep apnea (adult) (pediatric): Secondary | ICD-10-CM

## 2022-08-31 DIAGNOSIS — Z23 Encounter for immunization: Secondary | ICD-10-CM | POA: Diagnosis not present

## 2022-08-31 DIAGNOSIS — E038 Other specified hypothyroidism: Secondary | ICD-10-CM

## 2022-08-31 DIAGNOSIS — R69 Illness, unspecified: Secondary | ICD-10-CM | POA: Diagnosis not present

## 2022-08-31 LAB — BASIC METABOLIC PANEL
BUN: 15 mg/dL (ref 6–23)
CO2: 33 mEq/L — ABNORMAL HIGH (ref 19–32)
Calcium: 8.5 mg/dL (ref 8.4–10.5)
Chloride: 94 mEq/L — ABNORMAL LOW (ref 96–112)
Creatinine, Ser: 0.75 mg/dL (ref 0.40–1.20)
GFR: 76.45 mL/min (ref >60.00)
Glucose, Bld: 95 mg/dL (ref 70–99)
Potassium: 4 mEq/L (ref 3.5–5.1)
Sodium: 132 mEq/L — ABNORMAL LOW (ref 135–145)

## 2022-08-31 LAB — TSH: TSH: 11.3 u[IU]/mL — ABNORMAL HIGH (ref 0.35–5.50)

## 2022-08-31 MED ORDER — ALBUTEROL SULFATE HFA 108 (90 BASE) MCG/ACT IN AERS: 2.0000 | INHALATION_SPRAY | Freq: Four times a day (QID) | RESPIRATORY_TRACT | 2 refills | Status: DC | PRN

## 2022-08-31 NOTE — Assessment & Plan Note (Signed)
BP Readings from Last 3 Encounters:  08/31/22 (!) 135/53  07/30/22 (!) 180/80  07/20/22 (!) 117/37   Maintained on amlodipine, hctz, losartan, and metoprolol. Continue same.

## 2022-08-31 NOTE — Assessment & Plan Note (Signed)
Stable on pepcid '20mg'$  bid. Continue same.

## 2022-08-31 NOTE — Assessment & Plan Note (Signed)
Lab Results  Component Value Date   CHOL 143 07/20/2022   HDL 60.60 07/20/2022   LDLCALC 64 07/20/2022   LDLDIRECT 81.0 02/12/2020   TRIG 88.0 07/20/2022   CHOLHDL 2 07/20/2022   At goal on crestor '40mg'$ . Contineu same.

## 2022-08-31 NOTE — Assessment & Plan Note (Addendum)
Lab Results  Component Value Date   TSH 8.78 (H) 07/20/2022   It does not appear that the 125 mcg dose was called in last visit- I think she is still taking 131mg. Will repeat TSH today and adjust as needed.

## 2022-08-31 NOTE — Assessment & Plan Note (Signed)
Trying to be more compliant with her cpap and is less fatigued.

## 2022-08-31 NOTE — Assessment & Plan Note (Signed)
Stable on prn albuterol.  Continue same.

## 2022-08-31 NOTE — Assessment & Plan Note (Signed)
New. Will obtain 2 D echo.  Normal valves noted on 2019 echo.

## 2022-08-31 NOTE — Assessment & Plan Note (Signed)
Fair control with aricept. Son is her caregiver.

## 2022-08-31 NOTE — Assessment & Plan Note (Signed)
Stable with metamucil and miralax.

## 2022-08-31 NOTE — Progress Notes (Signed)
Subjective:   By signing my name below, I, [name], attest that this documentation has been prepared under the direction and in the presence of Jackie Chimera, NP 08/31/2022   Patient ID: Jackie Stevens, female    DOB: [birth year], 76 y.o.   MRN: [id number]  Chief Complaint  Patient presents with   Hypertension    Here for follow up   Hypothyroidism    Here for follow up   Hip Pain    Here for follow up    HPI Patient is in today for an office visit. She is accompanied by her son  Refill: She is requesting a refill of 108 MCG/ACT of Albuterol  Left Hip Pain: She reports that her left hip pain is persistent. She had a fall about a year ago and was sent to a specialist and was diagnosed with degenerative arthritis  Bruises: She reports of bruises along her arms. She takes an Aspirin during the mornings. She also takes Tylenol.   Asthma: She reports that her Asthma is stable. She has an Albuterol that she keeps near her bed in case of worsening symptoms.   Constipation: She states that she is not currently experiencing any constipation symptoms at this moment.   Dementia: She is currently taking 10 Mg of Aricept. In the last six months, her son believes that her symptoms are worsening. She is regularly following up with her PCP about her symptoms   Blood Pressure: She is currently taking 25 Mg of Hydrochlorothiazide, 2.5 Mg of Amlodipine, and 100 Mg of Metoprolol Tartrate BP Readings from Last 3 Encounters:  08/31/22 (!) 135/53  07/30/22 (!) 180/80  07/20/22 (!) 117/37   Pulse Readings from Last 3 Encounters:  08/31/22 (!) 53  07/30/22 64  07/20/22 (!) 58   Social: Her son reports that she goes to [facility] on Monday, on Tuesdays and Thursdays she is by herself. The rest of the days of the week, she is spending time with her sons.   Heartburn: Her symptoms are stable. She is currently taking two tablets of 20 Mg of Pepcid.   Cholesterol: Her cholesterol  levels are normal. She is currently taking 40 Mg of Crestor.   Thyroid: As of last visit, her Synthroid medication was increased from 112 MCG to 125 MCG. However, she is currently taking 112 MCG of Synthroid.   Sleep: Her son reports that she gets up about 2-3 times a night to use the restroom. Her son is making sure that she is wearing a Cpap.   Immunizations: She is interested in receiving an influenza vaccine during today's visit.   Health Maintenance Due  Topic Date Due   Hepatitis C Screening  Never done   COVID-19 Vaccine (5 - Mixed Product risk series) 12/15/2020    Past Medical History:  Diagnosis Date   Abnormal cervical cytology 10/25/2012   Follows with Dr Leavy Cella of Gyn   Allergic rhinitis 11/04/2010   Qualifier: Diagnosis of  By: Annamaria Boots MD, Clinton D    Anemia 10/06/2013   Anginal pain    pt has history of CP states had cardiac workup with no specific issues identified    Arthritis    knees; right thumb; shoulders   Arthritis of left shoulder region 09/30/2016   X-ray of the left shoulder on 09/14/2016: Marked degenerative change with significant osteophytic spurring and subchondral sclerosis.  Loss of glenohumeral joint space.  Well-maintained subacromial space.  Injected 09/30/2016 [name] .  Asthma, mild intermittent 04/02/2013   Chicken pox as a child   Colonic diverticular abscess 11/21/2012   Constipation    Decreased hearing    Dermatitis 03/06/2017   Diverticulitis 10/25/2012   pt. reports that a drain was placed - 09/2012     Diverticulitis of rectosigmoid 11/28/2012   Dry mouth    Essential hypertension 11/08/2010   Qualifier: Diagnosis of  By: Annamaria Boots MD, Clinton D    Excessive thirst    External hemorrhoid, bleeding    "sometimes" (01/09/2013)   Frequent urination    GERD (gastroesophageal reflux disease)    H/O hiatal hernia    Hand tingling    Heart murmur    History of kidney stones    Hyperglycemia 01/17/2020   Hyperlipidemia     Hypothyroidism    Incontinence    Insomnia    Kidney stones 1970's   "passed on their own" (01/09/13)   Low back pain 06/09/2014   Measles as a child   Mild neurocognitive disorder 02/05/2020   Obesity 09/11/2017   Obstructive sleep apnea 11/04/2010   NPSG [facility] 07/15/10- AHI 13.5/ hr CPAP 10/APS     Otitis media 01/07/2014   Palpitations    Paresthesia 03/23/2015   Left face   PONV (postoperative nausea and vomiting)    Rheumatoid arteritis    Shortness of breath dyspnea    using stairs   Sinus pain    Swelling of both lower extremities    Thyroid cancer 1980's   Tinnitus    UTI (urinary tract infection) 04/02/2013   Vertigo 08/05/2019   Vitamin D deficiency 02/28/2018    Past Surgical History:  Procedure Laterality Date   CHOLECYSTECTOMY  1990   COLON SURGERY     COLOSTOMY REVISION  Jan 09, 2013   Procedure: COLON RESECTION SIGMOID;  Surgeon: Gwenyth Ober, MD;  Location: [facility];  Service: General;  Laterality: N/A;   CYSTOSCOPY WITH STENT PLACEMENT  January 09, 2013   Procedure: CYSTOSCOPY WITH STENT PLACEMENT;  Surgeon: Hanley Ben, MD;  Location: [facility];  Service: Urology;  Laterality: N/A;   DILATION AND CURETTAGE OF UTERUS  1960's   "lots of them; had miscarriages" (09-Jan-2013)   LYSIS OF ADHESION N/A 12/02/2015   Procedure: LAPAROSCOPIC LYSIS OF ADHESION;  Surgeon: Johnathan Hausen, MD;  Location: [facility];  Service: General;  Laterality: N/A;   ROBOTIC ASSISTED BILATERAL SALPINGO OOPHERECTOMY Bilateral 12/02/2015   Procedure: XI ROBOTIC ASSISTED BILATERAL SALPINGO OOPHORECTOMY;  Surgeon: Everitt Amber, MD;  Location: [facility];  Service: Gynecology;  Laterality: Bilateral;   SIGMOID RESECTION / RECTOPEXY  01/09/13   THYROIDECTOMY, PARTIAL  1988   "then did iodine to remove the rest" (01-09-13)   TONSILLECTOMY  1951?   TOTAL KNEE ARTHROPLASTY Left 01/19/2021   Procedure: TOTAL KNEE ARTHROPLASTY;  Surgeon: Gaynelle Arabian, MD;  Location: [facility];  Service: Orthopedics;  Laterality: Left;  38mn    TRANSRECTAL DRAINAGE OF PELVIC ABSCESS  10/27/2012   VAGINAL HYSTERECTOMY  1970's   "still have my ovaries" (101-28-2014    Family History  Problem Relation Age of Onset   Heart disease Father    Pneumonia Father    Hypertension Father    Hyperlipidemia Father    Cancer Father        skin   Stroke Father    Alzheimer's disease Mother    Heart disease Mother    Depression Mother    Emphysema Brother        marijuana and cigarettes  Alcohol abuse Brother    Hearing loss Brother    Diabetes Maternal Grandmother    Alzheimer's disease Paternal Grandmother    Cancer Paternal Grandmother        lung?- smoker   Hyperlipidemia Paternal Grandmother    Heart attack Paternal Grandfather    Alcohol abuse Paternal Grandfather    Neurofibromatosis Son        schwanomatosis   Neurofibromatosis Son        swanomatosis   Cancer Paternal Aunt     Social History   Socioeconomic History   Marital status: Widowed    Spouse name: Clairissa Valvano   Number of children: 2   Years of education: 12   Highest education level: High school graduate  Occupational History   Occupation: Retired  Tobacco Use   Smoking status: Never    Passive exposure: Never   Smokeless tobacco: Never   Tobacco comments:    Verified by Christena Flake  Vaping Use   Vaping Use: Never used  Substance and Sexual Activity   Alcohol use: No    Alcohol/week: 0.0 standard drinks of alcohol   Drug use: No   Sexual activity: Not Currently  Other Topics Concern   Not on file  Social History Narrative   Married - (husband passed )   Children   Right handed   Some college   Social Determinants of Health   Financial Resource Strain: Low Risk  (05/14/2022)   Overall Financial Resource Strain (CARDIA)    Difficulty of Paying Living Expenses: Not very hard  Food Insecurity: No Food Insecurity (05/14/2022)   Hunger Vital Sign    Worried About Running Out of Food in the Last Year: Never true    Ran Out of Food in the  Last Year: Never true  Transportation Needs: No Transportation Needs (05/14/2022)   PRAPARE - Hydrologist (Medical): No    Lack of Transportation (Non-Medical): No  Physical Activity: Inactive (05/14/2022)   Exercise Vital Sign    Days of Exercise per Week: 0 days    Minutes of Exercise per Session: 0 min  Stress: No Stress Concern Present (05/14/2022)   [place]    Feeling of Stress : Only a little  Social Connections: Moderately Isolated (05/14/2022)   Social Connection and Isolation Panel [NHANES]    Frequency of Communication with Friends and Family: More than three times a week    Frequency of Social Gatherings with Friends and Family: More than three times a week    Attends Religious Services: 1 to 4 times per year    Active Member of [employer] or Organizations: No    Attends Archivist Meetings: Never    Marital Status: Widowed  Intimate Partner Violence: Not At Risk (05/14/2022)   Humiliation, Afraid, Rape, and Kick questionnaire    Fear of Current or Ex-Partner: No    Emotionally Abused: No    Physically Abused: No    Sexually Abused: No    Outpatient Medications Prior to Visit  Medication Sig Dispense Refill   amLODipine (NORVASC) 2.5 MG tablet TAKE 1 TABLET BY MOUTH EVERY DAY 90 tablet 1   Azelastine HCl 137 MCG/SPRAY SOLN PLACE 2 SPRAYS INTO BOTH NOSTRILS 2 (TWO) TIMES DAILY. 90 mL 4   Black Pepper-Turmeric (TURMERIC CURCUMIN) 04-999 MG CAPS 1 tablet daily.     carboxymethylcellulose (REFRESH PLUS) 0.5 % SOLN Place 1 drop into both  eyes 3 (three) times daily as needed (dry eyes).     Cholecalciferol-Vitamin C (VITAMIN D3-VITAMIN C) 1000-500 UNIT-MG CAPS daily.     docusate sodium (COLACE) 100 MG capsule Take 100 mg by mouth daily.     donepezil (ARICEPT) 10 MG tablet Take one tab daily 30 tablet 11   famotidine (PEPCID) 20 MG tablet TAKE 2 TABLETS BY MOUTH EVERY DAY 180 tablet  1   fluconazole (DIFLUCAN) 150 MG tablet Take today as directed; repeat after 72 hours 2 tablet 0   FLUoxetine (PROZAC) 10 MG tablet TAKE 1 TABLET BY MOUTH EVERY DAY 90 tablet 2   gabapentin (NEURONTIN) 300 MG capsule Take 1 capsule (300 mg total) by mouth at bedtime. 30 capsule 1   hydrochlorothiazide (HYDRODIURIL) 25 MG tablet TAKE 1 TABLET (25 MG TOTAL) BY MOUTH DAILY. 90 tablet 1   levothyroxine (SYNTHROID) 112 MCG tablet Take 1 tablet (112 mcg total) by mouth daily before breakfast. 90 tablet 1   losartan (COZAAR) 100 MG tablet TAKE 1 TABLET BY MOUTH EVERYDAY AT BEDTIME 90 tablet 1   metoprolol tartrate (LOPRESSOR) 100 MG tablet TAKE 1&1/2 TABLETS BY MOUTH TWICE A DAY 270 tablet 1   naproxen (NAPROSYN) 375 MG tablet TAKE 1 TABLET (375 MG TOTAL) BY MOUTH 2 (TWO) TIMES DAILY AS NEEDED FOR MODERATE PAIN OR HEADACHE. 60 tablet 3   nitroGLYCERIN (NITROSTAT) 0.4 MG SL tablet PLACE 1 TABLET UNDER THE TONGUE EVERY 5 MINUTES AS NEEDED FOR CHEST PAIN 25 tablet 1   omeprazole (PRILOSEC) 40 MG capsule TAKE 1 CAPSULE BY MOUTH TWICE A DAY 180 capsule 1   polyethylene glycol (MIRALAX / GLYCOLAX) 17 g packet Take 17 g by mouth daily.     Psyllium-Calcium (METAMUCIL PLUS CALCIUM) CAPS daily.     rosuvastatin (CRESTOR) 40 MG tablet TAKE 1 TABLET BY MOUTH EVERY DAY 90 tablet 1   temazepam (RESTORIL) 7.5 MG capsule 1 po every other day for 1 week, then every third day for 1 week. 7 capsule 0   No facility-administered medications prior to visit.    Allergies  Allergen Reactions   Neomycin-Bacitracin Zn-Polymyx Rash    Polysporin- is tolerated    Niacin Other (See Comments) and Cough    "cough til I threw up" (12/19/2012)   Ciprofloxacin Hives    Got cipro and flagyl at same time, localized hives to IV arm   Flagyl [Metronidazole] Hives    Got cipro and flagyl at same time, localized hives to IV arm    ROS    See  HPI Objective:    Physical Exam Constitutional:      General: She is not in acute  distress.    Appearance: Normal appearance. She is not ill-appearing.  HENT:     Head: Normocephalic and atraumatic.     Right Ear: External ear normal.     Left Ear: External ear normal.  Eyes:     Extraocular Movements: Extraocular movements intact.     Pupils: Pupils are equal, round, and reactive to light.  Cardiovascular:     Rate and Rhythm: Normal rate and regular rhythm.     Heart sounds: Murmur heard.     Systolic murmur is present with a grade of 1/6.     No gallop.  Pulmonary:     Effort: Pulmonary effort is normal. No respiratory distress.     Breath sounds: Normal breath sounds. No wheezing or rales.  Skin:    General: Skin is warm and dry.  Neurological:     Mental Status: She is alert. She is disoriented.     Comments: Repeating questions  Psychiatric:        Mood and Affect: Mood normal.        Behavior: Behavior normal.        Judgment: Judgment normal.     BP (!) 135/53 (BP Location: Right Arm, Patient Position: Sitting, Cuff Size: Small)   Pulse (!) 53   Temp 97.8 F (36.6 C) (Oral)   Resp 16   Wt 178 lb (80.7 kg)   SpO2 100%   BMI 32.56 kg/m  Wt Readings from Last 3 Encounters:  08/31/22 178 lb (80.7 kg)  07/30/22 179 lb (81.2 kg)  05/31/22 178 lb 9.6 oz (81 kg)       Assessment & Plan:   Problem List Items Addressed This Visit       Unprioritized   Obstructive sleep apnea    Trying to be more compliant with her cpap and is less fatigued.       Murmur    New. Will obtain 2 D echo.  Normal valves noted on 2019 echo.       Relevant Orders   ECHOCARDIOGRAM COMPLETE   Hypothyroidism    Lab Results  Component Value Date   TSH 8.78 (H) 07/20/2022  It does not appear that the 125 mcg dose was called in last visit- I think she is still taking 136mg. Will repeat TSH today and adjust as needed.        Relevant Orders   TSH   Hyperlipidemia, mixed    Lab Results  Component Value Date   CHOL 143 07/20/2022   HDL 60.60 07/20/2022    LDLCALC 64 07/20/2022   LDLDIRECT 81.0 02/12/2020   TRIG 88.0 07/20/2022   CHOLHDL 2 07/20/2022  At goal on crestor '40mg'$ . Contineu same.       Hyperglycemia           GERD (gastroesophageal reflux disease)    Stable on pepcid '20mg'$  bid. Continue same.       Essential hypertension    BP Readings from Last 3 Encounters:  08/31/22 (!) 135/53  07/30/22 (!) 180/80  07/20/22 (!) 117/37  Maintained on amlodipine, hctz, losartan, and metoprolol. Continue same.        Relevant Orders   Basic metabolic panel   Dementia with anxiety (HPinon    Fair control with aricept. Son is her caregiver.       Constipation    Stable with metamucil and miralax.       Asthma, mild intermittent    Stable on prn albuterol.  Continue same.       Relevant Medications   albuterol (VENTOLIN HFA) 108 (90 Base) MCG/ACT inhaler   Other Visit Diagnoses     Needs flu shot    -  Primary   Relevant Orders   Flu Vaccine QUAD High Dose(Fluad) (Completed)      Meds ordered this encounter  Medications   albuterol (VENTOLIN HFA) 108 (90 Base) MCG/ACT inhaler    Sig: Inhale 2 puffs into the lungs every 6 (six) hours as needed for wheezing or shortness of breath.    Dispense:  8 g    Refill:  2    Order Specific Question:   Supervising Provider    Answer:   BPenni HomansA [[id number]]    I, MNance Pear NP, personally preformed the services described in this documentation.  All medical  record entries made by the scribe were at my direction and in my presence.  I have reviewed the chart and discharge instructions (if applicable) and agree that the record reflects my personal performance and is accurate and complete. 08/31/2022   I,Amber Collins,acting as a Education administrator for Nance Pear, NP.,have documented all relevant documentation on the behalf of Nance Pear, NP,as directed by  Nance Pear, NP while in the presence of Nance Pear, NP.    Nance Pear,  NP

## 2022-09-01 ENCOUNTER — Other Ambulatory Visit: Payer: Self-pay | Admitting: Family Medicine

## 2022-09-01 ENCOUNTER — Telehealth: Payer: Self-pay | Admitting: Family

## 2022-09-01 DIAGNOSIS — E038 Other specified hypothyroidism: Secondary | ICD-10-CM

## 2022-09-01 MED ORDER — LEVOTHYROXINE SODIUM 125 MCG PO TABS: 125.0000 ug | ORAL_TABLET | Freq: Every day | ORAL | 0 refills | Status: DC

## 2022-09-01 NOTE — Telephone Encounter (Signed)
Patient's son advised of results and medication dose change. She was scheduled to come back 10-11-2022 for TSH

## 2022-09-01 NOTE — Telephone Encounter (Signed)
Please advise son that thyroid dose should be increased. I sent a new rx for synthroid 125 mcg to her pharmacy. Repeat TSH in 6 weeks.

## 2022-09-02 ENCOUNTER — Other Ambulatory Visit: Payer: Self-pay | Admitting: Family Medicine

## 2022-09-06 ENCOUNTER — Encounter: Payer: Self-pay | Admitting: Family

## 2022-09-13 ENCOUNTER — Other Ambulatory Visit (HOSPITAL_BASED_OUTPATIENT_CLINIC_OR_DEPARTMENT_OTHER): Payer: Medicare HMO

## 2022-09-14 ENCOUNTER — Other Ambulatory Visit: Payer: Self-pay | Admitting: Orthopaedic Surgery

## 2022-09-14 ENCOUNTER — Ambulatory Visit (HOSPITAL_BASED_OUTPATIENT_CLINIC_OR_DEPARTMENT_OTHER)
Admission: RE | Admit: 2022-09-14 | Discharge: 2022-09-14 | Disposition: A | Discharge status: Home / Self Care | Payer: Medicare HMO | Source: Ambulatory Visit | Attending: Family | Admitting: Family

## 2022-09-14 DIAGNOSIS — R011 Cardiac murmur, unspecified: Secondary | ICD-10-CM | POA: Diagnosis not present

## 2022-09-14 NOTE — Progress Notes (Signed)
  Echocardiogram 2D Echocardiogram has been performed.  Merrie Roof F 09/14/2022, 2:04 PM

## 2022-09-15 LAB — ECHOCARDIOGRAM COMPLETE
AR max vel: 2.45 cm2
AV Area VTI: 2.64 cm2
AV Area mean vel: 2.5 cm2
AV Mean grad: 4 mmHg
AV Peak grad: 6.6 mmHg
Ao pk vel: 1.28 m/s
Area-P 1/2: 2.35 cm2
S' Lateral: 3.1 cm

## 2022-09-20 NOTE — Assessment & Plan Note (Signed)
Encouraged good sleep hygiene such as dark, quiet room. No blue/green glowing lights such as computer screens in bedroom. No alcohol or stimulants in evening. Cut down on caffeine as able. Regular exercise is helpful but not just prior to bed time. Tolerating Temazepam

## 2022-09-20 NOTE — Assessment & Plan Note (Signed)
On Levothyroxine, continue to monitor 

## 2022-09-20 NOTE — Assessment & Plan Note (Signed)
Patient encouraged to maintain heart healthy diet, regular exercise, adequate sleep. Consider daily probiotics. Take medications as prescribed. Labs ordered and reviewed. [facility] 08/2021 Colonoscopy 2015 repeat in 10 years if appropriate Pap aged out RSV (respiratory syncitial virus) vaccine at pharmacy Covid booster when new version  At pharmacy High dose flu shot

## 2022-09-20 NOTE — Assessment & Plan Note (Addendum)
Monitor and report any concerns, no changes to meds. Encouraged heart healthy diet such as the DASH diet and exercise as tolerated.  ?

## 2022-09-20 NOTE — Assessment & Plan Note (Signed)
hgba1c acceptable, minimize simple carbs. Increase exercise as tolerated.  

## 2022-09-20 NOTE — Assessment & Plan Note (Signed)
Encourage heart healthy diet such as MIND or DASH diet, increase exercise, avoid trans fats, simple carbohydrates and processed foods, consider a krill or fish or flaxseed oil cap daily.  Tolerating Rosuvastatin 

## 2022-09-21 ENCOUNTER — Ambulatory Visit (INDEPENDENT_AMBULATORY_CARE_PROVIDER_SITE_OTHER): Payer: Medicare HMO | Admitting: Family Medicine

## 2022-09-21 ENCOUNTER — Other Ambulatory Visit (HOSPITAL_BASED_OUTPATIENT_CLINIC_OR_DEPARTMENT_OTHER): Payer: Self-pay

## 2022-09-21 VITALS — BP 122/74 | HR 59 | Temp 98.2°F | Resp 18 | Ht 62.0 in | Wt 177.0 lb

## 2022-09-21 DIAGNOSIS — Z0000 Encounter for general adult medical examination without abnormal findings: Secondary | ICD-10-CM | POA: Diagnosis not present

## 2022-09-21 DIAGNOSIS — R2681 Unsteadiness on feet: Secondary | ICD-10-CM

## 2022-09-21 DIAGNOSIS — E038 Other specified hypothyroidism: Secondary | ICD-10-CM

## 2022-09-21 DIAGNOSIS — F5101 Primary insomnia: Secondary | ICD-10-CM

## 2022-09-21 DIAGNOSIS — I10 Essential (primary) hypertension: Secondary | ICD-10-CM

## 2022-09-21 DIAGNOSIS — E782 Mixed hyperlipidemia: Secondary | ICD-10-CM

## 2022-09-21 DIAGNOSIS — M25552 Pain in left hip: Secondary | ICD-10-CM

## 2022-09-21 DIAGNOSIS — R739 Hyperglycemia, unspecified: Secondary | ICD-10-CM

## 2022-09-21 DIAGNOSIS — F0394 Unspecified dementia, unspecified severity, with anxiety: Secondary | ICD-10-CM

## 2022-09-21 MED ORDER — BENZONATATE 100 MG PO CAPS: 100.0000 mg | ORAL_CAPSULE | Freq: Three times a day (TID) | ORAL | 2 refills | Status: AC | PRN

## 2022-09-21 MED ORDER — AZELASTINE HCL 137 MCG/SPRAY NA SOLN: NASAL | 4 refills | Status: DC

## 2022-09-21 NOTE — Patient Instructions (Addendum)
Flonase once daily OTC Zyrtec 10 mg once daily OTC  RSV (respiratory syncitial virus) vaccine at pharmacy, Arexvy, take 2 weeks after Covid  Covid booster when new version now At pharmacy High dose flu shot  done  Preventive Care 65 Years and Older, Female Preventive care refers to lifestyle choices and visits with your health care provider that can promote health and wellness. Preventive care visits are also called wellness exams. What can I expect for my preventive care visit? Counseling Your health care provider may ask you questions about your: Medical history, including: Past medical problems. Family medical history. Pregnancy and menstrual history. History of falls. Current health, including: Memory and ability to understand (cognition). Emotional well-being. Home life and relationship well-being. Sexual activity and sexual health. Lifestyle, including: Alcohol, nicotine or tobacco, and drug use. Access to firearms. Diet, exercise, and sleep habits. Work and work Statistician. Sunscreen use. Safety issues such as seatbelt and bike helmet use. Physical exam Your health care provider will check your: Height and weight. These may be used to calculate your BMI (body mass index). BMI is a measurement that tells if you are at a healthy weight. Waist circumference. This measures the distance around your waistline. This measurement also tells if you are at a healthy weight and may help predict your risk of certain diseases, such as type 2 diabetes and high blood pressure. Heart rate and blood pressure. Body temperature. Skin for abnormal spots. What immunizations do I need?  Vaccines are usually given at various ages, according to a schedule. Your health care provider will recommend vaccines for you based on your age, medical history, and lifestyle or other factors, such as travel or where you work. What tests do I need? Screening Your health care provider may recommend  screening tests for certain conditions. This may include: Lipid and cholesterol levels. Hepatitis C test. Hepatitis B test. HIV (human immunodeficiency virus) test. STI (sexually transmitted infection) testing, if you are at risk. Lung cancer screening. Colorectal cancer screening. Diabetes screening. This is done by checking your blood sugar (glucose) after you have not eaten for a while (fasting). Mammogram. Talk with your health care provider about how often you should have regular mammograms. BRCA-related cancer screening. This may be done if you have a family history of breast, ovarian, tubal, or peritoneal cancers. Bone density scan. This is done to screen for osteoporosis. Talk with your health care provider about your test results, treatment options, and if necessary, the need for more tests. Follow these instructions at home: Eating and drinking  Eat a diet that includes fresh fruits and vegetables, whole grains, lean protein, and low-fat dairy products. Limit your intake of foods with high amounts of sugar, saturated fats, and salt. Take vitamin and mineral supplements as recommended by your health care provider. Do not drink alcohol if your health care provider tells you not to drink. If you drink alcohol: Limit how much you have to 0-1 drink a day. Know how much alcohol is in your drink. In the [place] one drink equals one 12 oz bottle of beer (355 mL), one 5 oz glass of wine (148 mL), or one 1 oz glass of hard liquor (44 mL). Lifestyle Brush your teeth every morning and night with fluoride toothpaste. Floss one time each day. Exercise for at least 30 minutes 5 or more days each week. Do not use any products that contain nicotine or tobacco. These products include cigarettes, chewing tobacco, and vaping devices, such as e-cigarettes. If  you need help quitting, ask your health care provider. Do not use drugs. If you are sexually active, practice safe sex. Use a condom or other  form of protection in order to prevent STIs. Take aspirin only as told by your health care provider. Make sure that you understand how much to take and what form to take. Work with your health care provider to find out whether it is safe and beneficial for you to take aspirin daily. Ask your health care provider if you need to take a cholesterol-lowering medicine (statin). Find healthy ways to manage stress, such as: Meditation, yoga, or listening to music. Journaling. Talking to a trusted person. Spending time with friends and family. Minimize exposure to UV radiation to reduce your risk of skin cancer. Safety Always wear your seat belt while driving or riding in a vehicle. Do not drive: If you have been drinking alcohol. Do not ride with someone who has been drinking. When you are tired or distracted. While texting. If you have been using any mind-altering substances or drugs. Wear a helmet and other protective equipment during sports activities. If you have firearms in your house, make sure you follow all gun safety procedures. What's next? Visit your health care provider once a year for an annual wellness visit. Ask your health care provider how often you should have your eyes and teeth checked. Stay up to date on all vaccines. This information is not intended to replace advice given to you by your health care provider. Make sure you discuss any questions you have with your health care provider. Document Revised: 05/27/2021 Document Reviewed: 05/27/2021 Elsevier Patient Education  [place].

## 2022-09-21 NOTE — Progress Notes (Signed)
Subjective:   By signing my name below, I, Jackie Stevens, attest that this documentation has been prepared under the direction and in the presence of Mosie Lukes, MD 09/21/2022.     Patient ID: Jackie Stevens, female    DOB: 04-17-1946, 76 y.o.   MRN: [id number]  Chief Complaint  Patient presents with   Annual Exam    Here for Cpe  Arthritis in Shoulder pain  Cough   HPI Patient is in today for a comprehensive physical exam and follow up on chronic medical concerns. Her husband is with her today and also speaking on her behalf.   She denies having any fever, chills, ear pain, headaches, muscle pain, joint pain, new moles, rash, itching, congestion, sinus pain, sore throat, chest pain, palpitations, wheezing, nausea, vomitting, abdominal pain, diarrhea, constipation, blood in stool, dysuria, urgency, frequency and hematuria.  Coughing: Her son states that she has been dealing with intermittent coughing and gagging for the past 1.5 weeks. He further states that she has taken Benzonatate to manage coughs in the past and is requesting a refill on this. He says that she has been eating well and taking probiotics to boost her immunity. She has not tested for COVID-19 but is interested in doing so today.   Family history: She reports no recent changes to her family history.   Immunizations: She has been informed about receiving COVID-19 and RSV immunizations. She has received the high-dose Flu immunization.   Left Hip Pain: She states that she has been experiencing left hip and thigh pain and tenderness. Her son says that she fell 3 years ago. She is interested in receiving at- home physical therapy to manage the pain.   Ophthalmology: She says that she is seeing yellow spots during today's visit and this is bothering her. She states that she last saw her ophthalmologist over a year ago.   Retirement Home: She states that she visits the [facility] 3 times a week.  Past  Medical History:  Diagnosis Date   Abnormal cervical cytology 10/25/2012   Follows with Dr Leavy Cella of Gyn   Allergic rhinitis 11/04/2010   Qualifier: Diagnosis of  By: Annamaria Boots MD, Clinton D    Anemia 10/06/2013   Anginal pain    pt has history of CP states had cardiac workup with no specific issues identified    Arthritis    knees; right thumb; shoulders   Arthritis of left shoulder region 09/30/2016   X-ray of the left shoulder on 09/14/2016: Marked degenerative change with significant osteophytic spurring and subchondral sclerosis.  Loss of glenohumeral joint space.  Well-maintained subacromial space.  Injected 09/30/2016 [name] .   Asthma, mild intermittent 04/02/2013   Chicken pox as a child   Colonic diverticular abscess 11/21/2012   Constipation    Decreased hearing    Dermatitis 03/06/2017   Diverticulitis 10/25/2012   pt. reports that a drain was placed - 09/2012     Diverticulitis of rectosigmoid 11/28/2012   Dry mouth    Essential hypertension 11/08/2010   Qualifier: Diagnosis of  By: Annamaria Boots MD, Clinton D    Excessive thirst    External hemorrhoid, bleeding    "sometimes" (12/19/2012)   Frequent urination    GERD (gastroesophageal reflux disease)    H/O hiatal hernia    Hand tingling    Heart murmur    History of kidney stones    Hyperglycemia 01/17/2020   Hyperlipidemia    Hypothyroidism  Incontinence    Insomnia    Kidney stones 1970's   "passed on their own" (2012/12/21)   Low back pain 06/09/2014   Measles as a child   Mild neurocognitive disorder 02/05/2020   Obesity 09/11/2017   Obstructive sleep apnea 11/04/2010   NPSG [facility] 07/15/10- AHI 13.5/ hr CPAP 10/APS     Otitis media 01/07/2014   Palpitations    Paresthesia 03/23/2015   Left face   PONV (postoperative nausea and vomiting)    Rheumatoid arteritis    Shortness of breath dyspnea    using stairs   Sinus pain    Swelling of both lower extremities    Thyroid cancer 1980's   Tinnitus    UTI (urinary  tract infection) 04/02/2013   Vertigo 08/05/2019   Vitamin D deficiency 02/28/2018   Past Surgical History:  Procedure Laterality Date   CHOLECYSTECTOMY  1990   COLON SURGERY     COLOSTOMY REVISION  Dec 21, 2012   Procedure: COLON RESECTION SIGMOID;  Surgeon: Gwenyth Ober, MD;  Location: [place];  Service: General;  Laterality: N/A;   CYSTOSCOPY WITH STENT PLACEMENT  2012-12-21   Procedure: CYSTOSCOPY WITH STENT PLACEMENT;  Surgeon: Hanley Ben, MD;  Location: [place];  Service: Urology;  Laterality: N/A;   DILATION AND CURETTAGE OF UTERUS  1960's   "lots of them; had miscarriages" (12-21-12)   LYSIS OF ADHESION N/A 12/02/2015   Procedure: LAPAROSCOPIC LYSIS OF ADHESION;  Surgeon: Johnathan Hausen, MD;  Location: [facility];  Service: General;  Laterality: N/A;   ROBOTIC ASSISTED BILATERAL SALPINGO OOPHERECTOMY Bilateral 12/02/2015   Procedure: XI ROBOTIC ASSISTED BILATERAL SALPINGO OOPHORECTOMY;  Surgeon: Everitt Amber, MD;  Location: [facility];  Service: Gynecology;  Laterality: Bilateral;   SIGMOID RESECTION / RECTOPEXY  2012/12/21   THYROIDECTOMY, PARTIAL  1988   "then did iodine to remove the rest" (December 21, 2012)   TONSILLECTOMY  1951?   TOTAL KNEE ARTHROPLASTY Left 01/19/2021   Procedure: TOTAL KNEE ARTHROPLASTY;  Surgeon: Gaynelle Arabian, MD;  Location: [facility];  Service: Orthopedics;  Laterality: Left;  69mn   TRANSRECTAL DRAINAGE OF PELVIC ABSCESS  10/27/2012   VAGINAL HYSTERECTOMY  1970's   "still have my ovaries" (101-09-14   Family History  Problem Relation Age of Onset   Heart disease Father    Pneumonia Father    Hypertension Father    Hyperlipidemia Father    Cancer Father        skin   Stroke Father    Alzheimer's disease Mother    Heart disease Mother    Depression Mother    Emphysema Brother        marijuana and cigarettes   Alcohol abuse Brother    Hearing loss Brother    Diabetes Maternal Grandmother    Alzheimer's disease Paternal Grandmother    Cancer Paternal Grandmother         lung?- smoker   Hyperlipidemia Paternal Grandmother    Heart attack Paternal Grandfather    Alcohol abuse Paternal Grandfather    Neurofibromatosis Son        schwanomatosis   Neurofibromatosis Son        swanomatosis   Cancer Paternal Aunt    Social History   Socioeconomic History   Marital status: Widowed    Spouse name: WNatalin Bible  Number of children: 2   Years of education: 12   Highest education level: High school graduate  Occupational History   Occupation: Retired  Tobacco Use   Smoking status:  Never    Passive exposure: Never   Smokeless tobacco: Never   Tobacco comments:    Verified by Christena Flake  Vaping Use   Vaping Use: Never used  Substance and Sexual Activity   Alcohol use: No    Alcohol/week: 0.0 standard drinks of alcohol   Drug use: No   Sexual activity: Not Currently  Other Topics Concern   Not on file  Social History Narrative   Married - (husband passed )   Children   Right handed   Some college   Social Determinants of Health   Financial Resource Strain: Low Risk  (05/14/2022)   Overall Financial Resource Strain (CARDIA)    Difficulty of Paying Living Expenses: Not very hard  Food Insecurity: No Food Insecurity (05/14/2022)   Hunger Vital Sign    Worried About Running Out of Food in the Last Year: Never true    Ran Out of Food in the Last Year: Never true  Transportation Needs: No Transportation Needs (05/14/2022)   PRAPARE - Hydrologist (Medical): No    Lack of Transportation (Non-Medical): No  Physical Activity: Inactive (05/14/2022)   Exercise Vital Sign    Days of Exercise per Week: 0 days    Minutes of Exercise per Session: 0 min  Stress: No Stress Concern Present (05/14/2022)   [place]    Feeling of Stress : Only a little  Social Connections: Moderately Isolated (05/14/2022)   Social Connection and Isolation Panel [NHANES]    Frequency  of Communication with Friends and Family: More than three times a week    Frequency of Social Gatherings with Friends and Family: More than three times a week    Attends Religious Services: 1 to 4 times per year    Active Member of [employer] or Organizations: No    Attends Archivist Meetings: Never    Marital Status: Widowed  Intimate Partner Violence: Not At Risk (05/14/2022)   Humiliation, Afraid, Rape, and Kick questionnaire    Fear of Current or Ex-Partner: No    Emotionally Abused: No    Physically Abused: No    Sexually Abused: No   Outpatient Medications Prior to Visit  Medication Sig Dispense Refill   albuterol (VENTOLIN HFA) 108 (90 Base) MCG/ACT inhaler Inhale 2 puffs into the lungs every 6 (six) hours as needed for wheezing or shortness of breath. 8 g 2   amLODipine (NORVASC) 2.5 MG tablet TAKE 1 TABLET BY MOUTH EVERY DAY 90 tablet 1   Black Pepper-Turmeric (TURMERIC CURCUMIN) 04-999 MG CAPS 1 tablet daily.     carboxymethylcellulose (REFRESH PLUS) 0.5 % SOLN Place 1 drop into both eyes 3 (three) times daily as needed (dry eyes).     Cholecalciferol-Vitamin C (VITAMIN D3-VITAMIN C) 1000-500 UNIT-MG CAPS daily.     docusate sodium (COLACE) 100 MG capsule Take 100 mg by mouth daily.     donepezil (ARICEPT) 10 MG tablet Take one tab daily 30 tablet 11   famotidine (PEPCID) 20 MG tablet Take 2 tablets (40 mg total) by mouth daily. 180 tablet 1   FLUoxetine (PROZAC) 10 MG tablet TAKE 1 TABLET BY MOUTH EVERY DAY 90 tablet 2   gabapentin (NEURONTIN) 300 MG capsule TAKE 1 CAPSULE BY MOUTH EVERYDAY AT BEDTIME 30 capsule 1   hydrochlorothiazide (HYDRODIURIL) 25 MG tablet TAKE 1 TABLET (25 MG TOTAL) BY MOUTH DAILY. 90 tablet 1   levothyroxine (SYNTHROID) 125  MCG tablet Take 1 tablet (125 mcg total) by mouth daily. 90 tablet 0   losartan (COZAAR) 100 MG tablet TAKE 1 TABLET BY MOUTH EVERYDAY AT BEDTIME 90 tablet 1   metoprolol tartrate (LOPRESSOR) 100 MG tablet TAKE 1&1/2 TABLETS BY  MOUTH TWICE A DAY 270 tablet 1   naproxen (NAPROSYN) 375 MG tablet TAKE 1 TABLET (375 MG TOTAL) BY MOUTH 2 (TWO) TIMES DAILY AS NEEDED FOR MODERATE PAIN OR HEADACHE. 60 tablet 3   nitroGLYCERIN (NITROSTAT) 0.4 MG SL tablet PLACE 1 TABLET UNDER THE TONGUE EVERY 5 MINUTES AS NEEDED FOR CHEST PAIN 25 tablet 1   omeprazole (PRILOSEC) 40 MG capsule TAKE 1 CAPSULE BY MOUTH TWICE A DAY 180 capsule 1   polyethylene glycol (MIRALAX / GLYCOLAX) 17 g packet Take 17 g by mouth daily.     Psyllium-Calcium (METAMUCIL PLUS CALCIUM) CAPS daily.     rosuvastatin (CRESTOR) 40 MG tablet TAKE 1 TABLET BY MOUTH EVERY DAY 90 tablet 1   temazepam (RESTORIL) 7.5 MG capsule 1 po every other day for 1 week, then every third day for 1 week. 7 capsule 0   Azelastine HCl 137 MCG/SPRAY SOLN PLACE 2 SPRAYS INTO BOTH NOSTRILS 2 (TWO) TIMES DAILY. 90 mL 4   fluconazole (DIFLUCAN) 150 MG tablet Take today as directed; repeat after 72 hours 2 tablet 0   No facility-administered medications prior to visit.   Allergies  Allergen Reactions   Neomycin-Bacitracin Zn-Polymyx Rash    Polysporin- is tolerated    Niacin Other (See Comments) and Cough    "cough til I threw up" (12/19/2012)   Ciprofloxacin Hives    Got cipro and flagyl at same time, localized hives to IV arm   Flagyl [Metronidazole] Hives    Got cipro and flagyl at same time, localized hives to IV arm   Review of Systems  Constitutional:  Negative for chills and fever.  HENT:  Negative for congestion, ear pain, sinus pain and sore throat.   Respiratory:  Negative for shortness of breath and wheezing.   Cardiovascular:  Negative for chest pain and palpitations.  Gastrointestinal:  Negative for abdominal pain, blood in stool, constipation, diarrhea, nausea and vomiting.  Genitourinary:  Negative for dysuria, frequency, hematuria and urgency.  Musculoskeletal:  Negative for joint pain and myalgias.       (+) Left hip and thigh pain.  Skin:  Negative for itching and  rash.       (-) New moles.  Neurological:  Negative for headaches.      Objective:    Physical Exam Constitutional:      General: She is not in acute distress.    Appearance: Normal appearance. She is not ill-appearing.  HENT:     Head: Normocephalic and atraumatic.     Right Ear: Tympanic membrane, ear canal and external ear normal.     Left Ear: Tympanic membrane, ear canal and external ear normal.     Mouth/Throat:     Mouth: Mucous membranes are moist.     Pharynx: Oropharynx is clear.  Eyes:     Extraocular Movements: Extraocular movements intact.     Right eye: No nystagmus.     Left eye: No nystagmus.     Pupils: Pupils are equal, round, and reactive to light.  Neck:     Vascular: No carotid bruit.  Cardiovascular:     Rate and Rhythm: Normal rate and regular rhythm.     Pulses: Normal pulses.  Heart sounds: Murmur heard.     Systolic murmur is present with a grade of 1/6.     No gallop.  Pulmonary:     Effort: Pulmonary effort is normal. No respiratory distress.     Breath sounds: Normal breath sounds. No wheezing or rales.  Abdominal:     General: Bowel sounds are normal.     Tenderness: There is no abdominal tenderness.  Musculoskeletal:     Comments: Muscle strength 5/5 on upper and lower extremities.   Lymphadenopathy:     Cervical: No cervical adenopathy.  Skin:    General: Skin is warm and dry.  Neurological:     Mental Status: She is alert and oriented to person, place, and time.     Sensory: Sensation is intact.     Motor: Motor function is intact.     Coordination: Coordination is intact.     Deep Tendon Reflexes:     Reflex Scores:      Patellar reflexes are 2+ on the right side and 2+ on the left side. Psychiatric:        Mood and Affect: Mood normal.        Behavior: Behavior normal.        Judgment: Judgment normal.    BP 122/74 (BP Location: Right Arm, Patient Position: Sitting, Cuff Size: Normal)   Pulse (!) 59   Temp 98.2 F (36.8  C) (Oral)   Resp 18   Ht '5\' 2"'$  (1.575 m)   Wt 177 lb (80.3 kg)   SpO2 99%   BMI 32.37 kg/m  Wt Readings from Last 3 Encounters:  09/21/22 177 lb (80.3 kg)  08/31/22 178 lb (80.7 kg)  07/30/22 179 lb (81.2 kg)   Diabetic Foot Exam - Simple   No data filed    Lab Results  Component Value Date   WBC 7.4 07/20/2022   HGB 12.3 07/20/2022   HCT 36.7 07/20/2022   PLT 285.0 07/20/2022   GLUCOSE 95 08/31/2022   CHOL 143 07/20/2022   TRIG 88.0 07/20/2022   HDL 60.60 07/20/2022   LDLDIRECT 81.0 02/12/2020   LDLCALC 64 07/20/2022   ALT 18 07/20/2022   AST 19 07/20/2022   NA 132 (L) 08/31/2022   K 4.0 08/31/2022   CL 94 (L) 08/31/2022   CREATININE 0.75 08/31/2022   BUN 15 08/31/2022   CO2 33 (H) 08/31/2022   TSH 11.30 (H) 08/31/2022   INR 1.0 01/12/2021   HGBA1C 5.8 07/20/2022   Lab Results  Component Value Date   TSH 11.30 (H) 08/31/2022   Lab Results  Component Value Date   WBC 7.4 07/20/2022   HGB 12.3 07/20/2022   HCT 36.7 07/20/2022   MCV 89.1 07/20/2022   PLT 285.0 07/20/2022   Lab Results  Component Value Date   NA 132 (L) 08/31/2022   K 4.0 08/31/2022   CO2 33 (H) 08/31/2022   GLUCOSE 95 08/31/2022   BUN 15 08/31/2022   CREATININE 0.75 08/31/2022   BILITOT 0.4 07/20/2022   ALKPHOS 85 07/20/2022   AST 19 07/20/2022   ALT 18 07/20/2022   PROT 6.3 07/20/2022   ALBUMIN 4.1 07/20/2022   CALCIUM 8.5 08/31/2022   ANIONGAP 8 01/20/2021   GFR 76.45 08/31/2022   Lab Results  Component Value Date   CHOL 143 07/20/2022   Lab Results  Component Value Date   HDL 60.60 07/20/2022   Lab Results  Component Value Date   LDLCALC 64 07/20/2022  Lab Results  Component Value Date   TRIG 88.0 07/20/2022   Lab Results  Component Value Date   CHOLHDL 2 07/20/2022   Lab Results  Component Value Date   HGBA1C 5.8 07/20/2022   Colonoscopy: Last completed on 12/20/2013.  -A benign appearing, intrinsic moderate stenosis, 7 cm from the anal verge,  measuring less than one cm (in length) was found in the recto-sigmoid colon and traversed with the pediatric colonoscope with ease. This stenosis was secondary to the sigmoid colon resection for complicated diverticulitis. The adult colonoscope was not able to traverse the stenosis.  -External and internal hemorrhoids were found during retro-flexion and medium sized.  Dexa: Last completed on 08/30/2017. Repeat in 5 years.   Mammogram: Last completed on 09/09/2021. No mammographic evidence of malignancy. Repeat in 1 year.   Pap Smear: Last completed on 12/02/2015. Normal results.     Assessment & Plan:   Problem List Items Addressed This Visit     Hyperlipidemia, mixed    Encourage heart healthy diet such as MIND or DASH diet, increase exercise, avoid trans fats, simple carbohydrates and processed foods, consider a krill or fish or flaxseed oil cap daily. Tolerating Rosuvastatin      Relevant Orders   Lipid panel   Essential hypertension    Monitor and report any concerns, no changes to meds. Encouraged heart healthy diet such as the DASH diet and exercise as tolerated.       Relevant Orders   CBC with Differential/Platelet   Comprehensive metabolic panel   TSH   Insomnia    Encouraged good sleep hygiene such as dark, quiet room. No blue/green glowing lights such as computer screens in bedroom. No alcohol or stimulants in evening. Cut down on caffeine as able. Regular exercise is helpful but not just prior to bed time. Tolerating Temazepam      Hypothyroidism    On Levothyroxine, continue to monitor      Preventative health care    Patient encouraged to maintain heart healthy diet, regular exercise, adequate sleep. Consider daily probiotics. Take medications as prescribed. Labs ordered and reviewed. [facility] 08/2021 Colonoscopy 2015 repeat in 10 years if appropriate Pap aged out RSV (respiratory syncitial virus) vaccine at pharmacy Covid booster when new version  At pharmacy High  dose flu shot      Hyperglycemia    hgba1c acceptable, minimize simple carbs. Increase exercise as tolerated.       Relevant Orders   Hemoglobin A1c   Dementia with anxiety (HCC)    On Donepezil and following with Neurology and overall doing well. Less anxious on Fluoxetine.       Meds ordered this encounter  Medications   benzonatate (TESSALON PERLES) 100 MG capsule    Sig: Take 1 capsule (100 mg total) by mouth 3 (three) times daily as needed for cough.    Dispense:  30 capsule    Refill:  2   Azelastine HCl 137 MCG/SPRAY SOLN    Sig: PLACE 2 SPRAYS INTO BOTH NOSTRILS 2 (TWO) TIMES DAILY.    Dispense:  90 mL    Refill:  4   I, Penni Homans, MD, personally preformed the services described in this documentation.  All medical record entries made by the scribe were at my direction and in my presence.  I have reviewed the chart and discharge instructions (if applicable) and agree that the record reflects my personal performance and is accurate and complete. 09/21/2022  I,Mohammed Iqbal,acting as a scribe for  Penni Homans, MD.,have documented all relevant documentation on the behalf of Penni Homans, MD,as directed by  Penni Homans, MD while in the presence of Penni Homans, MD.  Penni Homans, MD

## 2022-09-21 NOTE — Assessment & Plan Note (Addendum)
On Donepezil and following with Neurology and overall doing well. Less anxious on Fluoxetine.

## 2022-09-22 NOTE — Addendum Note (Signed)
Addended by: Penni Homans A on: 09/22/2022 05:36 PM   Modules accepted: Orders

## 2022-09-22 NOTE — Addendum Note (Signed)
Addended by: Penni Homans A on: 09/22/2022 09:15 PM   Modules accepted: Orders

## 2022-09-22 NOTE — Assessment & Plan Note (Signed)
Arthritic changes and increased pain with ambulation at left hip into lateral left thigh. Will have her set up with [facility]

## 2022-09-28 DIAGNOSIS — Z1231 Encounter for screening mammogram for malignant neoplasm of breast: Secondary | ICD-10-CM | POA: Diagnosis not present

## 2022-09-28 LAB — HM MAMMOGRAPHY

## 2022-10-11 ENCOUNTER — Other Ambulatory Visit: Payer: Medicare HMO

## 2022-10-12 ENCOUNTER — Telehealth: Payer: Self-pay | Admitting: Family Medicine

## 2022-10-12 ENCOUNTER — Other Ambulatory Visit (INDEPENDENT_AMBULATORY_CARE_PROVIDER_SITE_OTHER): Payer: Medicare HMO

## 2022-10-12 ENCOUNTER — Other Ambulatory Visit: Payer: Self-pay | Admitting: Family Medicine

## 2022-10-12 ENCOUNTER — Telehealth: Payer: Self-pay | Admitting: *Deleted

## 2022-10-12 DIAGNOSIS — R739 Hyperglycemia, unspecified: Secondary | ICD-10-CM

## 2022-10-12 DIAGNOSIS — M25552 Pain in left hip: Secondary | ICD-10-CM | POA: Diagnosis not present

## 2022-10-12 DIAGNOSIS — I10 Essential (primary) hypertension: Secondary | ICD-10-CM | POA: Diagnosis not present

## 2022-10-12 DIAGNOSIS — E782 Mixed hyperlipidemia: Secondary | ICD-10-CM | POA: Diagnosis not present

## 2022-10-12 DIAGNOSIS — M6289 Other specified disorders of muscle: Secondary | ICD-10-CM | POA: Diagnosis not present

## 2022-10-12 LAB — CBC WITH DIFFERENTIAL/PLATELET
Basophils Absolute: 0.1 10*3/uL (ref 0.0–0.1)
Basophils Relative: 0.9 % (ref 0.0–3.0)
Eosinophils Absolute: 0.2 10*3/uL (ref 0.0–0.7)
Eosinophils Relative: 3 % (ref 0.0–5.0)
HCT: 36.3 % (ref 36.0–46.0)
Hemoglobin: 12.1 g/dL (ref 12.0–15.0)
Lymphocytes Relative: 15 % (ref 12.0–46.0)
Lymphs Abs: 1 10*3/uL (ref 0.7–4.0)
MCHC: 33.4 g/dL (ref 30.0–36.0)
MCV: 89.9 fl (ref 78.0–100.0)
Monocytes Absolute: 0.6 10*3/uL (ref 0.1–1.0)
Monocytes Relative: 8.7 % (ref 3.0–12.0)
Neutro Abs: 4.9 10*3/uL (ref 1.4–7.7)
Neutrophils Relative %: 72.4 % (ref 43.0–77.0)
Platelets: 267 10*3/uL (ref 150.0–400.0)
RBC: 4.04 Mil/uL (ref 3.87–5.11)
RDW: 13.8 % (ref 11.5–15.5)
WBC: 6.7 10*3/uL (ref 4.0–10.5)

## 2022-10-12 LAB — COMPREHENSIVE METABOLIC PANEL
ALT: 16 U/L (ref 0–35)
AST: 17 U/L (ref 0–37)
Albumin: 4 g/dL (ref 3.5–5.2)
Alkaline Phosphatase: 77 U/L (ref 39–117)
BUN: 20 mg/dL (ref 6–23)
CO2: 33 mEq/L — ABNORMAL HIGH (ref 19–32)
Calcium: 8.6 mg/dL (ref 8.4–10.5)
Chloride: 97 mEq/L (ref 96–112)
Creatinine, Ser: 0.63 mg/dL (ref 0.40–1.20)
GFR: 85.12 mL/min (ref >60.00)
Glucose, Bld: 89 mg/dL (ref 70–99)
Potassium: 4.1 mEq/L (ref 3.5–5.1)
Sodium: 136 mEq/L (ref 135–145)
Total Bilirubin: 0.4 mg/dL (ref 0.2–1.2)
Total Protein: 6.2 g/dL (ref 6.0–8.3)

## 2022-10-12 LAB — LIPID PANEL
Cholesterol: 138 mg/dL (ref 0–200)
HDL: 56.7 mg/dL (ref >39.00)
LDL Cholesterol: 58 mg/dL (ref 0–99)
NonHDL: 80.94
Total CHOL/HDL Ratio: 2
Triglycerides: 117 mg/dL (ref 0.0–149.0)
VLDL: 23.4 mg/dL (ref 0.0–40.0)

## 2022-10-12 LAB — TSH: TSH: 1.54 u[IU]/mL (ref 0.35–5.50)

## 2022-10-12 LAB — HEMOGLOBIN A1C: Hgb A1c MFr Bld: 5.8 % (ref 4.6–6.5)

## 2022-10-12 NOTE — Telephone Encounter (Signed)
Pt came in for TSH lab visit today.  I saw labs ordered from 09/21/22 for comp, lipid, a1c, tsh, cbc w/diff.  They were not ordered as future, were just sitting in that encounter to be collected.  I released all of those orders today and drew for all the above labs.  Later I saw that pt just had all of those labs done on 07/20/22.  Is it too soon to do all labs today?  If so, I will ask the lab to credit all but the TSH back to the pt. Please advise?

## 2022-10-12 NOTE — Addendum Note (Signed)
Addended by: Kelle Darting A on: 10/12/2022 10:29 AM   Modules accepted: Orders

## 2022-10-12 NOTE — Telephone Encounter (Signed)
Physical therapist called to make PCP aware that the patient's blood pressure was elevated today. She stated patient was asymptomatic and showed no signs of stress. Patient's readings were 184/72 when she first got there and 179/81 towards the end. She also stated the patient heart rate was at 55 at the beginning and end of her vissit.

## 2022-10-13 NOTE — Telephone Encounter (Signed)
Called pt and spoke to pt son. Pt son stated she is fine,no dizziness, Blurry vision. Pt son stated just got out show but will send BP reading in [employer]. Pt son was advised if start experience any dizziness are feels worsen to call make appt are go to ER.

## 2022-10-14 ENCOUNTER — Other Ambulatory Visit: Payer: Self-pay | Admitting: Family Medicine

## 2022-11-03 ENCOUNTER — Other Ambulatory Visit: Payer: Self-pay | Admitting: Family Medicine

## 2022-11-06 ENCOUNTER — Other Ambulatory Visit: Payer: Self-pay | Admitting: Family Medicine

## 2022-11-15 ENCOUNTER — Ambulatory Visit: Payer: Medicare HMO | Admitting: Physician Assistant

## 2022-11-22 ENCOUNTER — Other Ambulatory Visit: Payer: Self-pay | Admitting: Orthopaedic Surgery

## 2022-11-23 DIAGNOSIS — L820 Inflamed seborrheic keratosis: Secondary | ICD-10-CM | POA: Diagnosis not present

## 2022-11-23 DIAGNOSIS — L821 Other seborrheic keratosis: Secondary | ICD-10-CM | POA: Diagnosis not present

## 2022-11-24 DIAGNOSIS — G4733 Obstructive sleep apnea (adult) (pediatric): Secondary | ICD-10-CM | POA: Diagnosis not present

## 2022-11-30 ENCOUNTER — Other Ambulatory Visit: Payer: Self-pay | Admitting: Family Medicine

## 2022-11-30 ENCOUNTER — Other Ambulatory Visit: Payer: Self-pay | Admitting: Family

## 2022-11-30 DIAGNOSIS — E038 Other specified hypothyroidism: Secondary | ICD-10-CM

## 2022-12-07 ENCOUNTER — Encounter: Payer: Self-pay | Admitting: Physician Assistant

## 2022-12-07 ENCOUNTER — Ambulatory Visit: Payer: Medicare HMO | Admitting: Physician Assistant

## 2022-12-07 VITALS — BP 185/75 | HR 91 | Ht 62.0 in | Wt 182.0 lb

## 2022-12-07 DIAGNOSIS — R413 Other amnesia: Secondary | ICD-10-CM

## 2022-12-07 MED ORDER — MEMANTINE HCL 5 MG PO TABS: 5.0000 mg | ORAL_TABLET | Freq: Every evening | ORAL | 11 refills | Status: DC

## 2022-12-07 NOTE — Progress Notes (Signed)
Assessment/Plan:   Memory Impairment    Jackie Stevens is a very pleasant 76 y.o. RH female with a history of hypertension, hyperlipidemia, thyroid cancer s/p partial thyroidectomy, arthritis with chronic pain,  seen today in follow up for memory loss. Patient is currently on donepezil 10 mg daily, tolerating well.  MMSE today is 16/30.     Recommendations    Follow up in  6 months. Continue donepezil 10 mg daily. Side effects were discussed  Start memantine 5 mg at night if tolerated we will entertain increasing to 5 mg bid. Side effects discussed Continue to control mood as per PCP Continue activities at the Adult day program      Subjective:    This patient is accompanied in the office by her son  who supplements the history.  Previous records as well as any outside records available were reviewed prior to todays visit. She was last seen on 05/03/22.     Any changes in memory since last visit? "A little worse"-her son says. She may  forget recent conversations or names of people.  She goes to [employer] form 10-2 and she enjoys very much. She makes ornaments, watch TV shows and old music and play games.  repeats oneself?  Endorsed. " On a 30 min drive she repeated the same sentence several times and asks the same questions". Disoriented when walking into a room? Sometimes. " Is this where we live? "Is this [place] or [place]? "Worse since the summer"  Leaving objects in unusual places? Endorsed. Dishes are put away in different places.   Wandering behavior?  Patient denies. Afraid to walk outside.    Any personality changes since last visit?  Patient denies   Any worsening depression?:  Patient denies. Hallucinations or paranoia?  Occasionally she is afraid someone will shoot her in the face id she looks at the window, but since putting curtains "it helps".    Seizures?   Patient denies    Any sleep changes?  Every now and then she will walk to the bathroom during the night, her son  reports. Denies vivid dreams, REM behavior or sleepwalking   Sleep apnea? Endorsed uses the CPAP Any hygiene concerns?  Patient denies   Independent of bathing and dressing?  Endorsed  Does the patient needs help with medications? Son is in charge  Who is in charge of the finances? Son  is in charge    Any changes in appetite? She does not remember what they feed her, but she remembers that she ate though.     Patient have trouble swallowing? Patient denies   Does the patient cook? Denies     Any headaches?  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 Uses a walker for stability and safety. Put cameras through the house to check that she did not fall Recent falls or head injuries?  Patient denies     Unilateral weakness, numbness or tingling?  Patient denies   Any tremors?  Patient denies   Any anosmia?  Patient denies   Any incontinence of urine?  Patient denies   Any bowel dysfunction?   Patient denies      Patient lives  with sone Does the patient drive?no longer drives    History on Initial Assessment 11/26/2019: This is a pleasant 76 year old right-handed woman with a history of hypertension, hyperlipidemia, thyroid cancer s/p partial thyroidectomy, presenting for  evaluation of memory loss. She does not think her memory is good, there are certain things she cannot remember. She looks to her husband for answers several times during the visit. She lives with her husband and son. She manages medications without difficulties and denies getting lost driving. She became concerned about her memory because she had always used to do their checkbook but in February, something was not right. She could not figure it out, and stopped doing it since then. She states she is not even sure she can do it now. Her husband does not think there is anything significantly concerning about her memory. He reports the checkbook  issue "threw her off." She gets flustered when she does not remember things. He has not noticed any personality changes but they both note that she is more paranoid, closing the blinds at night. They have lived in the same house for 37 years but for the past 6 months, she feels like someone is always looking in their house and will shoot at them, "I don't know why." She and her husband deny any hallucinations. She has always been worried about things, and is anxious today. She states mood is "I don't know," she is afraid to do some things but thinks mood is alright. She notes some depression due to inability to travel like before. Her mother had Alzheimer's disease. No history of significant head injuries. She rarely drinks alcohol.    She denies any headaches, diplopia, dysarthria/dysphagia, neck/back pain, focal numbness/tingling/weakness, bowel/bladder dysfunction, tremors. She had a lot of dizziness for a time where she would feel lightheaded and have to hold on. I personally reviewed head CT without contrast done in August 2020 for dizziness which did not show any acute changes, there was moderate diffuse atrophy. She had an infection prior to the start of Covid-19 where she lost her sense of taste and smell, it has not come back. She usually gets 7 hours of sleep with her CPAP machine, waking up at 4am. She is occasionally drowsy in the day.   PREVIOUS MEDICATIONS:   CURRENT MEDICATIONS:  Outpatient Encounter Medications as of 12/07/2022  Medication Sig   albuterol (VENTOLIN HFA) 108 (90 Base) MCG/ACT inhaler Inhale 2 puffs into the lungs every 6 (six) hours as needed for wheezing or shortness of breath.   amLODipine (NORVASC) 2.5 MG tablet Take 1 tablet (2.5 mg total) by mouth daily.   Azelastine HCl 137 MCG/SPRAY SOLN PLACE 2 SPRAYS INTO BOTH NOSTRILS 2 (TWO) TIMES DAILY.   benzonatate (TESSALON PERLES) 100 MG capsule Take 1 capsule (100 mg total) by mouth 3 (three) times daily as needed for  cough.   Black Pepper-Turmeric (TURMERIC CURCUMIN) 04-999 MG CAPS 1 tablet daily.   carboxymethylcellulose (REFRESH PLUS) 0.5 % SOLN Place 1 drop into both eyes 3 (three) times daily as needed (dry eyes).   Cholecalciferol-Vitamin C (VITAMIN D3-VITAMIN C) 1000-500 UNIT-MG CAPS daily.   docusate sodium (COLACE) 100 MG capsule Take 100 mg by mouth daily.   donepezil (ARICEPT) 10 MG tablet Take one tab daily   famotidine (PEPCID) 20 MG tablet Take 2 tablets (40 mg total) by mouth daily.   FLUoxetine (PROZAC) 10 MG tablet TAKE 1 TABLET BY MOUTH EVERY DAY   gabapentin (NEURONTIN) 300 MG capsule TAKE 1 CAPSULE BY MOUTH EVERYDAY AT BEDTIME   hydrochlorothiazide (HYDRODIURIL) 25 MG tablet TAKE 1 TABLET (25 MG TOTAL) BY MOUTH DAILY.   levothyroxine (SYNTHROID) 125 MCG tablet TAKE 1 TABLET BY MOUTH EVERY DAY  losartan (COZAAR) 100 MG tablet TAKE 1 TABLET BY MOUTH EVERYDAY AT BEDTIME   memantine (NAMENDA) 5 MG tablet Take 1 tablet (5 mg total) by mouth at bedtime.   metoprolol tartrate (LOPRESSOR) 100 MG tablet TAKE 1&1/2 TABLETS BY MOUTH TWICE A DAY   naproxen (NAPROSYN) 375 MG tablet TAKE 1 TABLET (375 MG TOTAL) BY MOUTH 2 (TWO) TIMES DAILY AS NEEDED FOR MODERATE PAIN OR HEADACHE.   nitroGLYCERIN (NITROSTAT) 0.4 MG SL tablet PLACE 1 TABLET UNDER THE TONGUE EVERY 5 MINUTES AS NEEDED FOR CHEST PAIN   omeprazole (PRILOSEC) 40 MG capsule TAKE 1 CAPSULE BY MOUTH TWICE A DAY   polyethylene glycol (MIRALAX / GLYCOLAX) 17 g packet Take 17 g by mouth daily.   Psyllium-Calcium (METAMUCIL PLUS CALCIUM) CAPS daily.   rosuvastatin (CRESTOR) 40 MG tablet Take 1 tablet (40 mg total) by mouth daily.   temazepam (RESTORIL) 7.5 MG capsule 1 po every other day for 1 week, then every third day for 1 week.   No facility-administered encounter medications on file as of 12/07/2022.       12/07/2022    9:00 AM 05/03/2022    3:00 PM 05/25/2018    3:25 PM  MMSE - Mini Mental State Exam  Orientation to time 0 0 5   Orientation to Place 4 0 5  Registration '1 3 3  '$ Attention/ Calculation '3 2 5  '$ Recall 2 0 2  Language- name 2 objects '1 2 2  '$ Language- repeat '1 1 1  '$ Language- follow 3 step command '2 3 3  '$ Language- read & follow direction '1 1 1  '$ Write a sentence 1 0 1  Copy design 0 0 1  Total score '16 12 29       '$ No data to display          Objective:     PHYSICAL EXAMINATION:    VITALS:   Vitals:   12/07/22 0901  BP: (!) 185/75  Pulse: 91  SpO2: 99%  Weight: 182 lb (82.6 kg)  Height: '5\' 2"'$  (1.575 m)    GEN:  The patient appears stated age and is in NAD. HEENT:  Normocephalic, atraumatic.   Neurological examination:  General: NAD, well-groomed, appears stated age. Orientation: The patient is alert. Oriented to person, place and date Cranial nerves: There is good facial symmetry. The speech is fluent and clear. No aphasia or dysarthria. Fund of knowledge is appropriate. Recent and remote memory are impaired. Attention and concentration are reduced.  Able to name objects and repeat phrases.  Hearing is intact to conversational tone.    Sensation: Sensation is intact to light touch throughout Motor: Strength is at least antigravity x4. DTR's 2/4 in UE/LE     Movement examination: Tone: There is normal tone in the UE/LE Abnormal movements:  no tremor.  No myoclonus.  No asterixis.   Coordination:  There is no decremation with RAM's. Normal finger to nose  Gait and Station: The patient has no difficulty arising out of a deep-seated chair without the use of the hands. The patient's stride length is good.  Gait is cautious and narrow.    Thank you for allowing [place] the opportunity to participate in the care of this nice patient. Please do not hesitate to contact us for any questions or concerns.   Total time spent on today's visit was 38 minutes dedicated to this patient today, preparing to see patient, examining the patient, ordering tests and/or medications and counseling the  patient, documenting  clinical information in the EHR or other health record, independently interpreting results and communicating results to the patient/family, discussing treatment and goals, answering patient's questions and coordinating care.  Cc:  Mosie Lukes, MD  Sharene Butters 12/07/2022 9:33 AM

## 2022-12-07 NOTE — Patient Instructions (Addendum)
    Follow up in  6 months. Continue donepezil 10 mg daily. Side effects were discussed  Start memantine 5 mg at night if tolerated we will entertain increasing to 5 mg bid  Continue the adult day program

## 2022-12-29 ENCOUNTER — Other Ambulatory Visit: Payer: Self-pay | Admitting: Family Medicine

## 2022-12-29 DIAGNOSIS — R059 Cough, unspecified: Secondary | ICD-10-CM

## 2022-12-29 DIAGNOSIS — K219 Gastro-esophageal reflux disease without esophagitis: Secondary | ICD-10-CM

## 2022-12-29 DIAGNOSIS — J302 Other seasonal allergic rhinitis: Secondary | ICD-10-CM

## 2023-01-02 NOTE — Assessment & Plan Note (Deleted)
Monitor and report any concerns, no changes to meds. Encouraged heart healthy diet such as the DASH diet and exercise as tolerated.  ?

## 2023-01-02 NOTE — Assessment & Plan Note (Deleted)
Encourage heart healthy diet such as MIND or DASH diet, increase exercise, avoid trans fats, simple carbohydrates and processed foods, consider a krill or fish or flaxseed oil cap daily. Tolerating Rosuvastatin

## 2023-01-02 NOTE — Assessment & Plan Note (Deleted)
Supplement and monitor 

## 2023-01-02 NOTE — Assessment & Plan Note (Deleted)
hgba1c acceptable, minimize simple carbs. Increase exercise as tolerated.  

## 2023-01-03 ENCOUNTER — Other Ambulatory Visit: Payer: Self-pay | Admitting: Physician Assistant

## 2023-01-03 ENCOUNTER — Ambulatory Visit: Payer: Medicare HMO | Admitting: Family Medicine

## 2023-01-03 ENCOUNTER — Telehealth: Payer: Self-pay

## 2023-01-03 NOTE — Telephone Encounter (Signed)
Caller Name Wylie Phone Number [phone] Patient Name Jackie Stevens Patient DOB 1946-06-28 Call Type Message Only Information Provided Reason for Call Request to Reschedule Office Appointment Initial Comment Caller states his mother needs to reschedule her appointment. Patient request to speak to RN No Additional Comment Office hours provided. Disp. Time Disposition Final User 01/02/2023 8:26:58 AM General Information Provided Yes Wille Celeste Call Closed By: Wille Celeste Transaction Date/Time: 01/02/2023 8:25:03 AM (ET)

## 2023-01-03 NOTE — Telephone Encounter (Signed)
Appt reschedule

## 2023-01-05 ENCOUNTER — Ambulatory Visit (INDEPENDENT_AMBULATORY_CARE_PROVIDER_SITE_OTHER): Payer: Medicare HMO

## 2023-01-05 VITALS — Wt 182.0 lb

## 2023-01-05 DIAGNOSIS — Z0000 Encounter for general adult medical examination without abnormal findings: Secondary | ICD-10-CM | POA: Diagnosis not present

## 2023-01-05 NOTE — Progress Notes (Signed)
I have reviewed and agree with [facility] documentation.  Kathlene November, MD

## 2023-01-05 NOTE — Patient Instructions (Signed)
Jackie Stevens , Thank you for taking time to come for your Medicare Wellness Visit. I appreciate your ongoing commitment to your health goals. Please review the following plan we discussed and let me know if I can assist you in the future.   These are the goals we discussed:  Goals       DIET - INCREASE WATER INTAKE      Drink at least 3 glasses of water per day.      Increase physical activity      Lose 20 lbs  (pt-stated)      Patient Stated      Well spring group classes         This is a list of the screening recommended for you and due dates:  Health Maintenance  Topic Date Due   Hepatitis C Screening: USPSTF Recommendation to screen - Ages 28-79 yo.  Never done   COVID-19 Vaccine (5 - 2023-24 season) 08/13/2022   DTaP/Tdap/Td vaccine (2 - Td or Tdap) 10/03/2023   Medicare Annual Wellness Visit  01/06/2024   Pneumonia Vaccine  Completed   Flu Shot  Completed   DEXA scan (bone density measurement)  Completed   Zoster (Shingles) Vaccine  Completed   HPV Vaccine  Aged Out   Colon Cancer Screening  Discontinued    Advanced directives: copies in chart   Conditions/risks identified: continue to be involved with well spring classes   Next appointment: Follow up in one year for your annual wellness visit    Preventive Care 65 Years and Older, Female Preventive care refers to lifestyle choices and visits with your health care provider that can promote health and wellness. What does preventive care include? A yearly physical exam. This is also called an annual well check. Dental exams once or twice a year. Routine eye exams. Ask your health care provider how often you should have your eyes checked. Personal lifestyle choices, including: Daily care of your teeth and gums. Regular physical activity. Eating a healthy diet. Avoiding tobacco and drug use. Limiting alcohol use. Practicing safe sex. Taking low-dose aspirin every day. Taking vitamin and mineral supplements as  recommended by your health care provider. What happens during an annual well check? The services and screenings done by your health care provider during your annual well check will depend on your age, overall health, lifestyle risk factors, and family history of disease. Counseling  Your health care provider may ask you questions about your: Alcohol use. Tobacco use. Drug use. Emotional well-being. Home and relationship well-being. Sexual activity. Eating habits. History of falls. Memory and ability to understand (cognition). Work and work Statistician. Reproductive health. Screening  You may have the following tests or measurements: Height, weight, and BMI. Blood pressure. Lipid and cholesterol levels. These may be checked every 5 years, or more frequently if you are over 62 years old. Skin check. Lung cancer screening. You may have this screening every year starting at age 27 if you have a 30-pack-year history of smoking and currently smoke or have quit within the past 15 years. Fecal occult blood test (FOBT) of the stool. You may have this test every year starting at age 61. Flexible sigmoidoscopy or colonoscopy. You may have a sigmoidoscopy every 5 years or a colonoscopy every 10 years starting at age 16. Hepatitis C blood test. Hepatitis B blood test. Sexually transmitted disease (STD) testing. Diabetes screening. This is done by checking your blood sugar (glucose) after you have not eaten for a while (  fasting). You may have this done every 1-3 years. Bone density scan. This is done to screen for osteoporosis. You may have this done starting at age 47. Mammogram. This may be done every 1-2 years. Talk to your health care provider about how often you should have regular mammograms. Talk with your health care provider about your test results, treatment options, and if necessary, the need for more tests. Vaccines  Your health care provider may recommend certain vaccines, such  as: Influenza vaccine. This is recommended every year. Tetanus, diphtheria, and acellular pertussis (Tdap, Td) vaccine. You may need a Td booster every 10 years. Zoster vaccine. You may need this after age 51. Pneumococcal 13-valent conjugate (PCV13) vaccine. One dose is recommended after age 74. Pneumococcal polysaccharide (PPSV23) vaccine. One dose is recommended after age 81. Talk to your health care provider about which screenings and vaccines you need and how often you need them. This information is not intended to replace advice given to you by your health care provider. Make sure you discuss any questions you have with your health care provider. Document Released: 12/26/2015 Document Revised: 08/18/2016 Document Reviewed: 09/30/2015 Elsevier Interactive Patient Education  2017 [place] Prevention in the Home Falls can cause injuries. They can happen to people of all ages. There are many things you can do to make your home safe and to help prevent falls. What can I do on the outside of my home? Regularly fix the edges of walkways and driveways and fix any cracks. Remove anything that might make you trip as you walk through a door, such as a raised step or threshold. Trim any bushes or trees on the path to your home. Use bright outdoor lighting. Clear any walking paths of anything that might make someone trip, such as rocks or tools. Regularly check to see if handrails are loose or broken. Make sure that both sides of any steps have handrails. Any raised decks and porches should have guardrails on the edges. Have any leaves, snow, or ice cleared regularly. Use sand or salt on walking paths during winter. Clean up any spills in your garage right away. This includes oil or grease spills. What can I do in the bathroom? Use night lights. Install grab bars by the toilet and in the tub and shower. Do not use towel bars as grab bars. Use non-skid mats or decals in the tub or  shower. If you need to sit down in the shower, use a plastic, non-slip stool. Keep the floor dry. Clean up any water that spills on the floor as soon as it happens. Remove soap buildup in the tub or shower regularly. Attach bath mats securely with double-sided non-slip rug tape. Do not have throw rugs and other things on the floor that can make you trip. What can I do in the bedroom? Use night lights. Make sure that you have a light by your bed that is easy to reach. Do not use any sheets or blankets that are too big for your bed. They should not hang down onto the floor. Have a firm chair that has side arms. You can use this for support while you get dressed. Do not have throw rugs and other things on the floor that can make you trip. What can I do in the kitchen? Clean up any spills right away. Avoid walking on wet floors. Keep items that you use a lot in easy-to-reach places. If you need to reach something above you, use  a strong step stool that has a grab bar. Keep electrical cords out of the way. Do not use floor polish or wax that makes floors slippery. If you must use wax, use non-skid floor wax. Do not have throw rugs and other things on the floor that can make you trip. What can I do with my stairs? Do not leave any items on the stairs. Make sure that there are handrails on both sides of the stairs and use them. Fix handrails that are broken or loose. Make sure that handrails are as long as the stairways. Check any carpeting to make sure that it is firmly attached to the stairs. Fix any carpet that is loose or worn. Avoid having throw rugs at the top or bottom of the stairs. If you do have throw rugs, attach them to the floor with carpet tape. Make sure that you have a light switch at the top of the stairs and the bottom of the stairs. If you do not have them, ask someone to add them for you. What else can I do to help prevent falls? Wear shoes that: Do not have high heels. Have  rubber bottoms. Are comfortable and fit you well. Are closed at the toe. Do not wear sandals. If you use a stepladder: Make sure that it is fully opened. Do not climb a closed stepladder. Make sure that both sides of the stepladder are locked into place. Ask someone to hold it for you, if possible. Clearly mark and make sure that you can see: Any grab bars or handrails. First and last steps. Where the edge of each step is. Use tools that help you move around (mobility aids) if they are needed. These include: Canes. Walkers. Scooters. Crutches. Turn on the lights when you go into a dark area. Replace any light bulbs as soon as they burn out. Set up your furniture so you have a clear path. Avoid moving your furniture around. If any of your floors are uneven, fix them. If there are any pets around you, be aware of where they are. Review your medicines with your doctor. Some medicines can make you feel dizzy. This can increase your chance of falling. Ask your doctor what other things that you can do to help prevent falls. This information is not intended to replace advice given to you by your health care provider. Make sure you discuss any questions you have with your health care provider. Document Released: 09/25/2009 Document Revised: 05/06/2016 Document Reviewed: 01/03/2015 Elsevier Interactive Patient Education  2017 [employer].

## 2023-01-05 NOTE — Progress Notes (Addendum)
I connected with  Jackie Stevens on 01/05/23 by a audio enabled telemedicine application and verified that I am speaking with the correct person using two identifiers. Along with son Jackie Stevens   Patient Location: Home  Provider Location: Office/Clinic  I discussed the limitations of evaluation and management by telemedicine. The patient expressed understanding and agreed to proceed.   Subjective:   Jackie Stevens is a 77 y.o. female who presents for Medicare Annual (Subsequent) preventive examination.  Review of Systems     Cardiac Risk Factors include: advanced age (>28mn, >>3women);hypertension;dyslipidemia;obesity (BMI >30kg/m2)     Objective:    Today's Vitals   01/05/23 0749  Weight: 182 lb (82.6 kg)   Body mass index is 33.29 kg/m.     01/05/2023    7:56 AM 05/14/2022    5:01 PM 05/14/2022    5:00 PM 05/09/2021    3:05 PM 01/19/2021    1:35 PM 01/19/2021    8:18 AM 01/12/2021    1:19 PM  Advanced Directives  Does Patient Have a Medical Advance Directive? Yes  Yes No  Yes Yes  Type of AParamedicof [place] will  [place] will  [place] will  [place] will  Does patient want to make changes to medical advance directive? No - Patient declined  No - Guardian declined  No - Guardian declined No - Patient declined   Copy of [place] Chart? Yes - validated most recent copy scanned in chart (See row information) No - copy requested       Would patient like information on creating a medical advance directive?    No - Patient declined       Current Medications (verified) Outpatient Encounter Medications as of 01/05/2023  Medication Sig   albuterol (VENTOLIN HFA) 108 (90 Base) MCG/ACT inhaler Inhale 2 puffs into the lungs every 6 (six) hours as needed for wheezing or shortness of breath.   amLODipine (NORVASC) 2.5 MG tablet Take 1 tablet (2.5 mg total) by  mouth daily.   Azelastine HCl 137 MCG/SPRAY SOLN PLACE 2 SPRAYS INTO BOTH NOSTRILS 2 (TWO) TIMES DAILY.   benzonatate (TESSALON PERLES) 100 MG capsule Take 1 capsule (100 mg total) by mouth 3 (three) times daily as needed for cough.   Black Pepper-Turmeric (TURMERIC CURCUMIN) 04-999 MG CAPS 1 tablet daily.   carboxymethylcellulose (REFRESH PLUS) 0.5 % SOLN Place 1 drop into both eyes 3 (three) times daily as needed (dry eyes).   Cholecalciferol-Vitamin C (VITAMIN D3-VITAMIN C) 1000-500 UNIT-MG CAPS daily.   docusate sodium (COLACE) 100 MG capsule Take 100 mg by mouth daily.   donepezil (ARICEPT) 10 MG tablet Take one tab daily   famotidine (PEPCID) 20 MG tablet Take 2 tablets (40 mg total) by mouth daily.   FLUoxetine (PROZAC) 10 MG tablet TAKE 1 TABLET BY MOUTH EVERY DAY   gabapentin (NEURONTIN) 300 MG capsule TAKE 1 CAPSULE BY MOUTH EVERYDAY AT BEDTIME   hydrochlorothiazide (HYDRODIURIL) 25 MG tablet TAKE 1 TABLET (25 MG TOTAL) BY MOUTH DAILY.   levothyroxine (SYNTHROID) 125 MCG tablet TAKE 1 TABLET BY MOUTH EVERY DAY   losartan (COZAAR) 100 MG tablet TAKE 1 TABLET BY MOUTH EVERYDAY AT BEDTIME   memantine (NAMENDA) 5 MG tablet TAKE 1 TABLET BY MOUTH EVERYDAY AT BEDTIME   metoprolol tartrate (LOPRESSOR) 100 MG tablet TAKE 1&1/2 TABLETS BY MOUTH TWICE A DAY   naproxen (NAPROSYN) 375 MG tablet  TAKE 1 TABLET (375 MG TOTAL) BY MOUTH 2 (TWO) TIMES DAILY AS NEEDED FOR MODERATE PAIN OR HEADACHE.   omeprazole (PRILOSEC) 40 MG capsule TAKE 1 CAPSULE BY MOUTH TWICE A DAY   polyethylene glycol (MIRALAX / GLYCOLAX) 17 g packet Take 17 g by mouth daily.   Psyllium-Calcium (METAMUCIL PLUS CALCIUM) CAPS daily.   rosuvastatin (CRESTOR) 40 MG tablet Take 1 tablet (40 mg total) by mouth daily.   temazepam (RESTORIL) 7.5 MG capsule 1 po every other day for 1 week, then every third day for 1 week.   nitroGLYCERIN (NITROSTAT) 0.4 MG SL tablet PLACE 1 TABLET UNDER THE TONGUE EVERY 5 MINUTES AS NEEDED FOR CHEST PAIN  (Patient not taking: Reported on 01/05/2023)   No facility-administered encounter medications on file as of 01/05/2023.    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  Loss of glenohumeral joint space.  Well-maintained subacromial space.  Injected 09/30/2016 Jackie Stevens .   Asthma, mild intermittent 04/02/2013   Chicken pox as a child   Colonic diverticular abscess 11/21/2012   Constipation    Decreased hearing    Dermatitis 03/06/2017   Diverticulitis 10/25/2012   pt. reports that a drain was placed - 09/2012     Diverticulitis of rectosigmoid 11/28/2012   Dry mouth    Essential hypertension 11/08/2010   Qualifier: Diagnosis of  By: Annamaria Boots MD, Clinton D    Excessive thirst    External hemorrhoid, bleeding    "sometimes" (12-23-2012)   Frequent urination    GERD (gastroesophageal reflux disease)    H/O hiatal hernia    Hand tingling    Heart murmur    History of kidney stones    Hyperglycemia 01/17/2020   Hyperlipidemia    Hypothyroidism    Incontinence    Insomnia    Kidney stones 1970's   "passed on their own" (2012/12/23)   Low back pain 06/09/2014   Measles as a child   Mild neurocognitive disorder 02/05/2020   Obesity 09/11/2017   Obstructive sleep apnea 11/04/2010   NPSG [facility] 07/15/10- AHI 13.5/ hr CPAP 10/APS     Otitis media 01/07/2014   Palpitations     Paresthesia 03/23/2015   Left face   PONV (postoperative nausea and vomiting)    Rheumatoid arteritis    Shortness of breath dyspnea    using stairs   Sinus pain    Swelling of both lower extremities    Thyroid cancer 1980's   Tinnitus    UTI (urinary tract infection) 04/02/2013   Vertigo 08/05/2019   Vitamin D deficiency 02/28/2018   Past Surgical History:  Procedure Laterality Date   CHOLECYSTECTOMY  1990   COLON SURGERY     COLOSTOMY REVISION  12/23/12   Procedure: COLON RESECTION SIGMOID;  Surgeon: Gwenyth Ober, MD;  Location: [place];  Service: General;  Laterality: N/A;   CYSTOSCOPY  WITH STENT PLACEMENT  12/19/2012   Procedure: CYSTOSCOPY WITH STENT PLACEMENT;  Surgeon: Hanley Ben, MD;  Location: [place];  Service: Urology;  Laterality: N/A;   DILATION AND CURETTAGE OF UTERUS  1960's   "lots of them; had miscarriages" (12/19/2012)   LYSIS OF ADHESION N/A 12/02/2015   Procedure: LAPAROSCOPIC LYSIS OF ADHESION;  Surgeon: Johnathan Hausen, MD;  Location: [facility];  Service: General;  Laterality: N/A;   ROBOTIC ASSISTED BILATERAL SALPINGO OOPHERECTOMY Bilateral 12/02/2015   Procedure: XI ROBOTIC ASSISTED BILATERAL SALPINGO OOPHORECTOMY;  Surgeon: Everitt Amber, MD;  Location: [facility];  Service: Gynecology;  Laterality: Bilateral;   SIGMOID RESECTION / RECTOPEXY  12/19/2012   THYROIDECTOMY, PARTIAL  1988   "then did iodine to remove the rest" (12/19/2012)   TONSILLECTOMY  1951?   TOTAL KNEE ARTHROPLASTY Left 01/19/2021   Procedure: TOTAL KNEE ARTHROPLASTY;  Surgeon: Gaynelle Arabian, MD;  Location: [facility];  Service: Orthopedics;  Laterality: Left;  33mn   TRANSRECTAL DRAINAGE OF PELVIC ABSCESS  10/27/2012   VAGINAL HYSTERECTOMY  1970's   "still have my ovaries" (12/19/2012)   Family History  Problem Relation Age of Onset   Heart disease Father    Pneumonia Father    Hypertension Father    Hyperlipidemia Father    Cancer Father        skin   Stroke Father    Alzheimer's disease Mother     Heart disease Mother    Depression Mother    Emphysema Brother        marijuana and cigarettes   Alcohol abuse Brother    Hearing loss Brother    Diabetes Maternal Grandmother    Alzheimer's disease Paternal Grandmother    Cancer Paternal Grandmother        lung?- smoker   Hyperlipidemia Paternal Grandmother    Heart attack Paternal Grandfather    Alcohol abuse Paternal Grandfather    Neurofibromatosis Son        schwanomatosis   Neurofibromatosis Son        swanomatosis   Cancer Paternal Aunt    Social History   Socioeconomic History   Marital status: Widowed    Spouse name: WKatilynn Sinkler  Number of children: 2   Years of education: 12   Highest education level: High school graduate  Occupational History   Occupation: Retired  Tobacco Use   Smoking status: Never    Passive exposure: Never   Smokeless tobacco: Never   Tobacco comments:    Verified by SChristena Flake Vaping Use   Vaping Use: Never used  Substance and Sexual Activity   Alcohol use: No    Alcohol/week: 0.0 standard drinks of alcohol   Drug use: No   Sexual activity: Not Currently  Other Topics Concern   Not on file  Social History Narrative   Married - (husband passed )   Children   Right handed   Some college   Retired   3 sons   Social Determinants of Health   Financial Resource Strain: [place] (01/05/2023)   Overall Financial Resource Strain (CARDIA)    Difficulty of Paying Living Expenses: Not hard at all  Food Insecurity: No Food Insecurity (01/05/2023)   Hunger Vital Sign    Worried About Running Out of Food in the Last Year: Never true    [place] the Last Year: Never true  Transportation Needs: No Transportation Needs (01/05/2023)   [place]- Transportation  Lack of Transportation (Medical): No    Lack of Transportation (Non-Medical): No  Physical Activity: Insufficiently Active (01/05/2023)   Exercise Vital Sign    Days of Exercise per Week: 5 days    Minutes of  Exercise per Session: 20 min  Stress: No Stress Concern Present (01/05/2023)   [place]    Feeling of Stress : Not at all  Social Connections: Moderately Isolated (01/05/2023)   Social Connection and Isolation Panel [NHANES]    Frequency of Communication with Friends and Family: More than three times a week    Frequency of Social Gatherings with Friends and Family: More than three times a week    Attends Religious Services: Never    Marine scientist or Organizations: Yes    Attends Archivist Meetings: 1 to 4 times per year    Marital Status: Widowed    Tobacco Counseling Counseling given: Not Answered Tobacco comments: Verified by Christena Flake   Clinical Intake:  Pre-visit preparation completed: Yes  Pain : No/denies pain     BMI - recorded: 33.29 Nutritional Status: BMI > 30  Obese Nutritional Risks: None Diabetes: No  How often do you need to have someone help you when you read instructions, pamphlets, or other written materials from your doctor or pharmacy?: 1 - Never  Diabetic?no  Interpreter Needed?: No  Information entered by :: Charlott Rakes, LPN   Activities of Daily Living    01/05/2023    7:58 AM 05/14/2022    4:57 PM  In your present state of health, do you have any difficulty performing the following activities:  Hearing? 0 0  Vision? 0 1  Comment  Prescription Glasses  Difficulty concentrating or making decisions? 1 1  Comment at times Dementia with Anxiety  Walking or climbing stairs? 0 1  Comment  Unsteady Gait/Balance  Dressing or bathing? 0 1  Comment  Dementia and Unsteady Gait  Doing errands, shopping? 1 1  Comment  Dementia with Anxiety  Preparing Food and eating ? Y Y  Comment  Son, Tanekia Ryans Assists  Using the Toilet? N Y  Comment  Son, Zamyiah Tino Assists  In the past six months, have you accidently leaked urine? N Y  Comment  Son, Kynesha Guerin  Assists  Do you have problems with loss of bowel control? N N  Managing your Medications? Tempie Donning  Comment son peter Son, Abrar Koone Assists  Managing your Finances? Y Y  Comment sone peter Son, Meghana Tullo Assists  Housekeeping or managing your Housekeeping? Y Y  Comment  Son, Joanmarie Tsang Assists    Patient Care Team: Mosie Lukes, MD as PCP - General (Family Medicine) Josue Hector, MD as PCP - Cardiology (Cardiology) Marylynn Pearson, MD as Consulting Physician (Obstetrics and Gynecology) Marica Otter, [place] as Consulting Physician (Optometry) Reynold Bowen, MD as Consulting Physician (Endocrinology) Gaynelle Arabian, MD as Consulting Physician (Orthopedic Surgery) Meylor, Marlou Sa, [place] as Consulting Physician (Chiropractic Medicine) Sydnee Levans, MD as Consulting Physician (Dermatology) Hazle Coca, PhD as Consulting Physician (Psychology) Delice Lesch Lezlie Octave, MD as Consulting Physician (Neurology)  Indicate any recent Medical Services you may have received from other than [facility] providers in the past year (date may be approximate).     Assessment:   This is a routine wellness examination for [place].  Hearing/Vision screen Hearing Screening - Comments:: Pt denies any hearing issues  Vision Screening - Comments:: Pt follows up with  dr Marica Otter for annual eye exams   Dietary issues and exercise activities discussed: Current Exercise Habits: Home exercise routine, Type of exercise: stretching;walking, Time (Minutes): 20, Frequency (Times/Week): 5, Weekly Exercise (Minutes/Week): 100   Goals Addressed             This Visit's Progress    Patient Stated       [facility] group classes        Depression Screen    01/05/2023    7:55 AM 09/21/2022   10:05 AM 07/30/2022    3:56 PM 05/14/2022    4:49 PM 05/14/2022    4:48 PM 04/20/2022    3:37 PM 10/15/2021    8:33 AM  PHQ 2/9 Scores  PHQ - 2 Score 0 0 0 0  0 1  PHQ- 9 Score  0  0  0 8  Exception Documentation     Other-  indicate reason in comment box    Not completed     Verified by Christena Flake      Fall Risk    01/05/2023    7:58 AM 12/07/2022    9:03 AM 09/21/2022   10:05 AM 07/30/2022    3:56 PM 05/14/2022    4:57 PM  Fall Risk   Falls in the past year? 0 0 0 0 1  Number falls in past yr: 0 1 0 0 1  Injury with Fall? 0 0 0 0 1  Risk for fall due to : Impaired vision  No Fall Risks No Fall Risks History of fall(s);Impaired balance/gait;Impaired mobility;Impaired vision;Mental status change  Follow up Falls prevention discussed Falls evaluation completed Falls evaluation completed Falls evaluation completed Falls evaluation completed;Education provided;Falls prevention discussed    FALL RISK PREVENTION PERTAINING TO THE HOME:  Any stairs in or around the home? Yes  If so, are there any without handrails? No  Home free of loose throw rugs in walkways, pet beds, electrical cords, etc? Yes  Adequate lighting in your home to reduce risk of falls? Yes   ASSISTIVE DEVICES UTILIZED TO PREVENT FALLS:  Life alert? Yes  Use of a cane, walker or w/c? Yes  Grab bars in the bathroom? Yes  Shower chair or bench in shower? Yes  Elevated toilet seat or a handicapped toilet? Yes   TIMED UP AND GO:  Was the test performed? No .   Cognitive Function: pt unable to complete at this time     12/07/2022    9:00 AM 05/03/2022    3:00 PM 05/25/2018    3:25 PM 05/24/2017    3:31 PM 08/20/2015    2:00 PM  MMSE - Mini Mental State Exam  Orientation to time 0 0 '5 5 5  '$ Orientation to Place 4 0 '5 5 5  '$ Registration '1 3 3 3 3  '$ Attention/ Calculation '3 2 5 5 5  '$ Recall 2 0 '2 3 3  '$ Language- name 2 objects '1 2 2 2 2  '$ Language- repeat '1 1 1 1 1  '$ Language- follow 3 step command '2 3 3 3 3  '$ Language- read & follow direction '1 1 1 1 1  '$ Write a sentence 1 0 '1 1 1  '$ Copy design 0 0 '1 1 1  '$ Total score '16 12 29 30 30        '$ 05/29/2020    1:12 PM  6CIT Screen  What Year? 0 points  What month? 0 points  What time? 0  points  Count  back from 20 0 points  Months in reverse 0 points  Repeat phrase 4 points  Total Score 4 points    Immunizations Immunization History  Administered Date(s) Administered   Fluad Quad(high Dose 65+) 09/15/2021, 08/31/2022   Influenza Split 09/12/2012, 10/02/2015   Influenza, High Dose Seasonal PF 09/14/2016, 09/05/2017, 09/01/2018, 07/25/2019, 08/01/2020, 08/22/2020   Influenza,inj,Quad PF,6+ Mos 08/30/2013, 10/21/2014, 08/20/2015   Influenza,inj,quad, With Preservative 09/12/2017, 08/25/2020   Influenza-Unspecified 08/01/2019, 08/01/2020   Moderna SARS-COV2 Booster Vaccination 10/20/2020   Moderna Sars-Covid-2 Vaccination 01/19/2020, 02/11/2020, 03/12/2020   PFIZER(Purple Top)SARS-COV-2 Vaccination 02/20/2020   Pneumococcal Conjugate-13 07/19/2011, 11/01/2014   Pneumococcal Polysaccharide-23 03/30/2016   Tdap 10/02/2013   Zoster Recombinat (Shingrix) 01/31/2019, 06/17/2019   Zoster, Live 12/09/2008    TDAP status: Up to date  Flu Vaccine status: Up to date  Pneumococcal vaccine status: Up to date  Covid-19 vaccine status: Completed vaccines  Qualifies for Shingles Vaccine? Yes   Zostavax completed Yes   Shingrix Completed?: Yes  Screening Tests Health Maintenance  Topic Date Due   Hepatitis C Screening  Never done   COVID-19 Vaccine (5 - 2023-24 season) 08/13/2022   DTaP/Tdap/Td (2 - Td or Tdap) 10/03/2023   Medicare Annual Wellness (AWV)  01/06/2024   Pneumonia Vaccine 63+ Years old  Completed   INFLUENZA VACCINE  Completed   DEXA SCAN  Completed   Zoster Vaccines- Shingrix  Completed   HPV VACCINES  Aged Out   COLONOSCOPY (Pts 45-40yr Insurance coverage will need to be confirmed)  Discontinued    Health Maintenance  Health Maintenance Due  Topic Date Due   Hepatitis C Screening  Never done   COVID-19 Vaccine (5 - 2023-24 season) 08/13/2022    Colorectal cancer screening: No longer required.   Mammogram status: Completed 09/28/22. Repeat  every year  Bone Density status: Completed 08/30/17. Results reflect: Bone density results: NORMAL. Repeat every 2 years.   Additional Screening:  Hepatitis C Screening: does qualify;   Vision Screening: Recommended annual ophthalmology exams for early detection of glaucoma and other disorders of the eye. Is the patient up to date with their annual eye exam?  Yes  Who is the provider or what is the name of the office in which the patient attends annual eye exams? Dr sMarica Otter If pt is not established with a provider, would they like to be referred to a provider to establish care? No .   Dental Screening: Recommended annual dental exams for proper oral hygiene  Community Resource Referral / Chronic Care Management: CRR required this visit?  No   CCM required this visit?  No      Plan:     I have personally reviewed and noted the following in the patient's chart:   Medical and social history Use of alcohol, tobacco or illicit drugs  Current medications and supplements including opioid prescriptions. Patient is not currently taking opioid prescriptions. Functional ability and status Nutritional status Physical activity Advanced directives List of other physicians Hospitalizations, surgeries, and ER visits in previous 12 months Vitals Screenings to include cognitive, depression, and falls Referrals and appointments  In addition, I have reviewed and discussed with patient certain preventive protocols, quality metrics, and best practice recommendations. A written personalized care plan for preventive services as well as general preventive health recommendations were provided to patient.     TWillette Brace LPN   11/24/864  Nurse Notes: Pt unable to complete son peter stated mom just had a neurology appt  in November.

## 2023-01-16 ENCOUNTER — Other Ambulatory Visit: Payer: Self-pay | Admitting: Family Medicine

## 2023-01-20 ENCOUNTER — Ambulatory Visit (HOSPITAL_BASED_OUTPATIENT_CLINIC_OR_DEPARTMENT_OTHER)
Admission: RE | Admit: 2023-01-20 | Discharge: 2023-01-20 | Disposition: A | Discharge status: Home / Self Care | Payer: Medicare HMO | Source: Ambulatory Visit | Attending: Family Medicine | Admitting: Family Medicine

## 2023-01-20 ENCOUNTER — Ambulatory Visit (INDEPENDENT_AMBULATORY_CARE_PROVIDER_SITE_OTHER): Payer: Medicare HMO | Admitting: Family Medicine

## 2023-01-20 ENCOUNTER — Encounter: Payer: Self-pay | Admitting: Family Medicine

## 2023-01-20 VITALS — BP 158/68 | HR 62 | Temp 98.2°F | Resp 16 | Ht 64.0 in | Wt 171.0 lb

## 2023-01-20 DIAGNOSIS — R140 Abdominal distension (gaseous): Secondary | ICD-10-CM | POA: Insufficient documentation

## 2023-01-20 DIAGNOSIS — R110 Nausea: Secondary | ICD-10-CM | POA: Diagnosis not present

## 2023-01-20 DIAGNOSIS — R8271 Bacteriuria: Secondary | ICD-10-CM | POA: Diagnosis not present

## 2023-01-20 DIAGNOSIS — R8290 Unspecified abnormal findings in urine: Secondary | ICD-10-CM

## 2023-01-20 NOTE — Progress Notes (Signed)
Acute Office Visit  Subjective:     Patient ID: Jackie Stevens, female    DOB: 04/15/1946, 77 y.o.   MRN: [id number]  Chief Complaint  Patient presents with   Nausea   GI Problem    HPI Patient is in today for nausea and bloating since 01/15/23. She reports she did have some COVID exposures last week but has had multiple negative home tests. States she had one episode f vomiting initially, but since then only nausea, bloating, feeling full quickly, and poor appetite. She denies any diarrhea, fevers, chills, URI symptoms, abdominal pain, fatigue, body aches, or urinary symptoms. Patient had some memory impairment and is a poor historian - she cannot remember when her last BM was and is unsure if she has been passing gas as usual. States it is possible she could be constipated. Reports at baseline she eats small meals, drinks 2 cups of water per day and sips on Dr Malachi Bonds and coffee the rest of the day.     ROS All review of systems negative except what is listed in the HPI      Objective:    BP (!) 158/68   Pulse 62   Temp 98.2 F (36.8 C)   Resp 16   Ht 5' 4"$  (1.626 m)   Wt 171 lb (77.6 kg)   SpO2 97%   BMI 29.35 kg/m    Physical Exam Vitals reviewed.  Constitutional:      Appearance: Normal appearance.  Cardiovascular:     Rate and Rhythm: Normal rate and regular rhythm.     Pulses: Normal pulses.     Heart sounds: Normal heart sounds.  Pulmonary:     Effort: Pulmonary effort is normal.     Breath sounds: Normal breath sounds.  Abdominal:     General: Bowel sounds are normal. There is no distension.     Palpations: Abdomen is soft. There is no mass.     Tenderness: There is no abdominal tenderness. There is no right CVA tenderness, left CVA tenderness, guarding or rebound.  Skin:    General: Skin is warm and dry.  Neurological:     Mental Status: She is alert and oriented to person, place, and time.  Psychiatric:        Mood and Affect: Mood normal.         Behavior: Behavior normal.        Thought Content: Thought content normal.        Judgment: Judgment normal.     Results for orders placed or performed in visit on 01/20/23  Urinalysis with Culture Reflex   Specimen: Urine  Result Value Ref Range   Color, Urine DARK YELLOW YELLOW   APPearance CLOUDY (A) CLEAR   Specific Gravity, Urine 1.028 1.001 - 1.035   pH 6.0 5.0 - 8.0   Glucose, UA NEGATIVE NEGATIVE   Bilirubin Urine NEGATIVE NEGATIVE   Ketones, ur NEGATIVE NEGATIVE   Hgb urine dipstick NEGATIVE NEGATIVE   Protein, ur TRACE (A) NEGATIVE   Nitrites, Initial NEGATIVE NEGATIVE   Leukocyte Esterase 1+ (A) NEGATIVE   WBC, UA 10-20 (A) 0 - 5 /HPF   RBC / HPF NONE SEEN 0 - 2 /HPF   Squamous Epithelial / HPF 10-20 (A) < OR = 5 /HPF   Bacteria, UA MODERATE (A) NONE SEEN /HPF   Hyaline Cast 0-5 (A) NONE SEEN /LPF   Note    REFLEXIVE URINE CULTURE  Result Value Ref Range  REFLEXIVE URINE CULTURE          Assessment & Plan:   Problem List Items Addressed This Visit   None Visit Diagnoses     Nausea    -  Primary   Relevant Orders   DG Abd 1 View (Completed)   Urinalysis with Culture Reflex (Completed)   Lipase   CBC with Differential/Platelet   Comprehensive metabolic panel   Abdominal bloating       Relevant Orders   DG Abd 1 View (Completed)   Urinalysis with Culture Reflex (Completed)   Lipase   CBC with Differential/Platelet   Comprehensive metabolic panel      Blood work and urine sample ordered today along with abdominal xray to check for possible constipation. We will let you know results and plan.  Make sure you are drinking enough water during the day.   BP is elevated - please follow-up in 2 weeks for BP check with nurse. Monitor occasionally at home.   Please contact office for follow-up if symptoms do not improve or worsen. Seek emergency care if symptoms become severe.   No orders of the defined types were placed in this encounter.   Return  in about 2 weeks (around 02/03/2023), or if symptoms worsen or fail to improve, for BP check with nurse.  Terrilyn Saver, NP

## 2023-01-20 NOTE — Patient Instructions (Signed)
Blood work and urine sample ordered today along with abdominal xray to check for possible constipation. We will let you know results and plan.  Make sure you are drinking enough water during the day.   Please contact office for follow-up if symptoms do not improve or worsen. Seek emergency care if symptoms become severe.

## 2023-01-21 LAB — COMPREHENSIVE METABOLIC PANEL
ALT: 17 U/L (ref 0–35)
AST: 19 U/L (ref 0–37)
Albumin: 4 g/dL (ref 3.5–5.2)
Alkaline Phosphatase: 71 U/L (ref 39–117)
BUN: 20 mg/dL (ref 6–23)
CO2: 31 mEq/L (ref 19–32)
Calcium: 8.6 mg/dL (ref 8.4–10.5)
Chloride: 92 mEq/L — ABNORMAL LOW (ref 96–112)
Creatinine, Ser: 0.77 mg/dL (ref 0.40–1.20)
GFR: 73.87 mL/min (ref >60.00)
Glucose, Bld: 107 mg/dL — ABNORMAL HIGH (ref 70–99)
Potassium: 3.8 mEq/L (ref 3.5–5.1)
Sodium: 135 mEq/L (ref 135–145)
Total Bilirubin: 0.3 mg/dL (ref 0.2–1.2)
Total Protein: 6.4 g/dL (ref 6.0–8.3)

## 2023-01-21 LAB — CBC WITH DIFFERENTIAL/PLATELET
Basophils Absolute: 0.1 10*3/uL (ref 0.0–0.1)
Basophils Relative: 1 % (ref 0.0–3.0)
Eosinophils Absolute: 0.1 10*3/uL (ref 0.0–0.7)
Eosinophils Relative: 1.1 % (ref 0.0–5.0)
HCT: 37.9 % (ref 36.0–46.0)
Hemoglobin: 12.8 g/dL (ref 12.0–15.0)
Lymphocytes Relative: 20.5 % (ref 12.0–46.0)
Lymphs Abs: 1.6 10*3/uL (ref 0.7–4.0)
MCHC: 33.8 g/dL (ref 30.0–36.0)
MCV: 87 fl (ref 78.0–100.0)
Monocytes Absolute: 0.9 10*3/uL (ref 0.1–1.0)
Monocytes Relative: 11.7 % (ref 3.0–12.0)
Neutro Abs: 5.2 10*3/uL (ref 1.4–7.7)
Neutrophils Relative %: 65.7 % (ref 43.0–77.0)
Platelets: 321 10*3/uL (ref 150.0–400.0)
RBC: 4.36 Mil/uL (ref 3.87–5.11)
RDW: 13.2 % (ref 11.5–15.5)
WBC: 7.9 10*3/uL (ref 4.0–10.5)

## 2023-01-21 LAB — LIPASE: Lipase: 36 U/L (ref 11.0–59.0)

## 2023-01-21 MED ORDER — CEFDINIR 300 MG PO CAPS: 300.0000 mg | ORAL_CAPSULE | Freq: Two times a day (BID) | ORAL | 0 refills | Status: AC

## 2023-01-21 NOTE — Addendum Note (Signed)
Addended by: Caleen Jobs B on: 01/21/2023 02:11 PM   Modules accepted: Orders

## 2023-01-22 LAB — URINALYSIS W MICROSCOPIC + REFLEX CULTURE
Bilirubin Urine: NEGATIVE
Glucose, UA: NEGATIVE
Hgb urine dipstick: NEGATIVE
Ketones, ur: NEGATIVE
Nitrites, Initial: NEGATIVE
RBC / HPF: NONE SEEN /HPF (ref 0–2)
Specific Gravity, Urine: 1.028 (ref 1.001–1.035)
pH: 6 (ref 5.0–8.0)

## 2023-01-22 LAB — CULTURE INDICATED

## 2023-01-22 LAB — URINE CULTURE
MICRO NUMBER:: 14542180
SPECIMEN QUALITY:: ADEQUATE

## 2023-01-28 ENCOUNTER — Other Ambulatory Visit: Payer: Self-pay | Admitting: Orthopaedic Surgery

## 2023-02-01 ENCOUNTER — Ambulatory Visit: Payer: Medicare HMO | Admitting: Family Medicine

## 2023-02-01 ENCOUNTER — Ambulatory Visit (INDEPENDENT_AMBULATORY_CARE_PROVIDER_SITE_OTHER): Payer: Medicare HMO | Admitting: Family Medicine

## 2023-02-01 VITALS — BP 126/72 | HR 63 | Temp 98.0°F | Resp 16 | Ht 62.0 in | Wt 176.8 lb

## 2023-02-01 DIAGNOSIS — R739 Hyperglycemia, unspecified: Secondary | ICD-10-CM

## 2023-02-01 DIAGNOSIS — K529 Noninfective gastroenteritis and colitis, unspecified: Secondary | ICD-10-CM

## 2023-02-01 DIAGNOSIS — J4520 Mild intermittent asthma, uncomplicated: Secondary | ICD-10-CM | POA: Diagnosis not present

## 2023-02-01 DIAGNOSIS — E559 Vitamin D deficiency, unspecified: Secondary | ICD-10-CM

## 2023-02-01 DIAGNOSIS — E038 Other specified hypothyroidism: Secondary | ICD-10-CM

## 2023-02-01 DIAGNOSIS — R413 Other amnesia: Secondary | ICD-10-CM | POA: Diagnosis not present

## 2023-02-01 DIAGNOSIS — E782 Mixed hyperlipidemia: Secondary | ICD-10-CM | POA: Diagnosis not present

## 2023-02-01 DIAGNOSIS — D649 Anemia, unspecified: Secondary | ICD-10-CM | POA: Diagnosis not present

## 2023-02-01 DIAGNOSIS — I10 Essential (primary) hypertension: Secondary | ICD-10-CM

## 2023-02-01 MED ORDER — ONDANSETRON HCL 4 MG PO TABS: 4.0000 mg | ORAL_TABLET | Freq: Three times a day (TID) | ORAL | 1 refills | Status: AC | PRN

## 2023-02-01 NOTE — Progress Notes (Signed)
Subjective:   By signing my name below, I, [name], attest that this documentation has been prepared under the direction and in the presence of Mosie Lukes, MD., 02/01/2023.   Patient ID: Jackie Stevens, female    DOB: 06/28/1946, 77 y.o.   MRN: [id number]  Chief Complaint  Patient presents with   Follow-up    Follow up   HPI Patient is in today for an office visit. She denies CP/palpitations/SOB/HA/congestion/ fever/chills.  Nausea/GI Sickness Patient complains of nausea and stomach sickness that has been present since the beginning of 01/2023. She reports that she constantly feels nauseous and is on the verge of vomiting. She has a poor appetite and has been drinking mostly Dr Malachi Bonds. Last night she experienced incontinence but denies any other urinary symptoms today. She denies headache but states that she is seeing yellow flashes during today's visit.  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  Loss of glenohumeral joint space.  Well-maintained subacromial space.  Injected 09/30/2016 [name] .   Asthma, mild intermittent 04/02/2013   Chicken pox as a child   Colonic diverticular abscess 11/21/2012   Constipation    Decreased hearing    Dermatitis 03/06/2017   Diverticulitis 10/25/2012   pt. reports that a drain was placed - 09/2012     Diverticulitis of rectosigmoid 11/28/2012   Dry mouth    Essential hypertension 11/08/2010   Qualifier: Diagnosis of  By: Annamaria Boots MD,  Clinton D    Excessive thirst    External hemorrhoid, bleeding    "sometimes" (01/04/2013)   Frequent urination    GERD (gastroesophageal reflux disease)    H/O hiatal hernia    Hand tingling    Heart murmur    History of kidney stones    Hyperglycemia 01/17/2020   Hyperlipidemia    Hypothyroidism    Incontinence    Insomnia    Kidney stones 1970's   "passed on their own" (04-Jan-2013)   Low back pain 06/09/2014   Measles as a child   Mild neurocognitive disorder 02/05/2020   Obesity 09/11/2017   Obstructive sleep apnea 11/04/2010   NPSG [facility] 07/15/10- AHI 13.5/ hr CPAP 10/APS     Otitis media 01/07/2014   Palpitations    Paresthesia 03/23/2015   Left face   PONV (postoperative nausea and vomiting)    Rheumatoid arteritis    Shortness of breath dyspnea    using stairs   Sinus pain    Swelling of both lower extremities    Thyroid cancer 1980's   Tinnitus    UTI (urinary tract infection) 04/02/2013   Vertigo 08/05/2019   Vitamin D deficiency 02/28/2018    Past Surgical History:  Procedure Laterality Date   CHOLECYSTECTOMY  1990   COLON SURGERY     COLOSTOMY REVISION  2013-01-04   Procedure: COLON RESECTION SIGMOID;  Surgeon: Gwenyth Ober, MD;  Location: [facility];  Service: General;  Laterality: N/A;  CYSTOSCOPY WITH STENT PLACEMENT  12/19/2012   Procedure: CYSTOSCOPY WITH STENT PLACEMENT;  Surgeon: Hanley Ben, MD;  Location: [facility];  Service: Urology;  Laterality: N/A;   DILATION AND CURETTAGE OF UTERUS  1960's   "lots of them; had miscarriages" (12/19/2012)   LYSIS OF ADHESION N/A 12/02/2015   Procedure: LAPAROSCOPIC LYSIS OF ADHESION;  Surgeon: Johnathan Hausen, MD;  Location: [facility];  Service: General;  Laterality: N/A;   ROBOTIC ASSISTED BILATERAL SALPINGO OOPHERECTOMY Bilateral 12/02/2015   Procedure: XI ROBOTIC ASSISTED BILATERAL SALPINGO OOPHORECTOMY;  Surgeon: Everitt Amber, MD;  Location: [facility];  Service: Gynecology;  Laterality: Bilateral;   SIGMOID RESECTION / RECTOPEXY   12/19/2012   THYROIDECTOMY, PARTIAL  1988   "then did iodine to remove the rest" (12/19/2012)   TONSILLECTOMY  1951?   TOTAL KNEE ARTHROPLASTY Left 01/19/2021   Procedure: TOTAL KNEE ARTHROPLASTY;  Surgeon: Gaynelle Arabian, MD;  Location: [facility];  Service: Orthopedics;  Laterality: Left;  15mn   TRANSRECTAL DRAINAGE OF PELVIC ABSCESS  10/27/2012   VAGINAL HYSTERECTOMY  1970's   "still have my ovaries" (12/19/2012)    Family History  Problem Relation Age of Onset   Heart disease Father    Pneumonia Father    Hypertension Father    Hyperlipidemia Father    Cancer Father        skin   Stroke Father    Alzheimer's disease Mother    Heart disease Mother    Depression Mother    Emphysema Brother        marijuana and cigarettes   Alcohol abuse Brother    Hearing loss Brother    Diabetes Maternal Grandmother    Alzheimer's disease Paternal Grandmother    Cancer Paternal Grandmother        lung?- smoker   Hyperlipidemia Paternal Grandmother    Heart attack Paternal Grandfather    Alcohol abuse Paternal Grandfather    Neurofibromatosis Son        schwanomatosis   Neurofibromatosis Son        swanomatosis   Cancer Paternal Aunt     Social History   Socioeconomic History   Marital status: Widowed    Spouse name: WEmme Litwiller  Number of children: 2   Years of education: 12   Highest education level: High school graduate  Occupational History   Occupation: Retired  Tobacco Use   Smoking status: Never    Passive exposure: Never   Smokeless tobacco: Never   Tobacco comments:    Verified by SChristena Flake Vaping Use   Vaping Use: Never used  Substance and Sexual Activity   Alcohol use: No    Alcohol/week: 0.0 standard drinks of alcohol   Drug use: No   Sexual activity: Not Currently  Other Topics Concern   Not on file  Social History Narrative   Married - (husband passed )   Children   Right handed   Some college   Retired   3 sons   Social Determinants of Health    Financial Resource Strain: [place] (01/05/2023)   Overall Financial Resource Strain (CARDIA)    Difficulty of Paying Living Expenses: Not hard at all  Food Insecurity: No Food Insecurity (01/05/2023)   Hunger Vital Sign    Worried About Running Out of Food in the Last Year: Never true    RWinonain the Last Year: Never true  Transportation Needs: No Transportation Needs (01/05/2023)   [place]-  Hydrologist (Medical): No    Lack of Transportation (Non-Medical): No  Physical Activity: Insufficiently Active (01/05/2023)   Exercise Vital Sign    Days of Exercise per Week: 5 days    Minutes of Exercise per Session: 20 min  Stress: No Stress Concern Present (01/05/2023)   [place]    Feeling of Stress : Not at all  Social Connections: Moderately Isolated (01/05/2023)   Social Connection and Isolation Panel [NHANES]    Frequency of Communication with Friends and Family: More than three times a week    Frequency of Social Gatherings with Friends and Family: More than three times a week    Attends Religious Services: Never    Marine scientist or Organizations: Yes    Attends Archivist Meetings: 1 to 4 times per year    Marital Status: Widowed  Intimate Partner Violence: Not At Risk (01/05/2023)   Humiliation, Afraid, Rape, and Kick questionnaire    Fear of Current or Ex-Partner: No    Emotionally Abused: No    Physically Abused: No    Sexually Abused: No    Outpatient Medications Prior to Visit  Medication Sig Dispense Refill   albuterol (VENTOLIN HFA) 108 (90 Base) MCG/ACT inhaler Inhale 2 puffs into the lungs every 6 (six) hours as needed for wheezing or shortness of breath. 8 g 2   amLODipine (NORVASC) 2.5 MG tablet Take 1 tablet (2.5 mg total) by mouth daily. 90 tablet 1   Azelastine HCl 137 MCG/SPRAY SOLN PLACE 2 SPRAYS INTO BOTH NOSTRILS 2 (TWO) TIMES DAILY. 90  mL 4   benzonatate (TESSALON PERLES) 100 MG capsule Take 1 capsule (100 mg total) by mouth 3 (three) times daily as needed for cough. 30 capsule 2   Black Pepper-Turmeric (TURMERIC CURCUMIN) 04-999 MG CAPS 1 tablet daily.     carboxymethylcellulose (REFRESH PLUS) 0.5 % SOLN Place 1 drop into both eyes 3 (three) times daily as needed (dry eyes).     Cholecalciferol-Vitamin C (VITAMIN D3-VITAMIN C) 1000-500 UNIT-MG CAPS daily.     docusate sodium (COLACE) 100 MG capsule Take 100 mg by mouth daily.     donepezil (ARICEPT) 10 MG tablet Take one tab daily 30 tablet 11   famotidine (PEPCID) 20 MG tablet Take 2 tablets (40 mg total) by mouth daily. 180 tablet 1   FLUoxetine (PROZAC) 10 MG tablet TAKE 1 TABLET BY MOUTH EVERY DAY 90 tablet 2   gabapentin (NEURONTIN) 300 MG capsule TAKE 1 CAPSULE BY MOUTH EVERYDAY AT BEDTIME 30 capsule 1   hydrochlorothiazide (HYDRODIURIL) 25 MG tablet TAKE 1 TABLET (25 MG TOTAL) BY MOUTH DAILY. 90 tablet 1   levothyroxine (SYNTHROID) 125 MCG tablet TAKE 1 TABLET BY MOUTH EVERY DAY 90 tablet 0   losartan (COZAAR) 100 MG tablet TAKE 1 TABLET BY MOUTH EVERYDAY AT BEDTIME 90 tablet 1   memantine (NAMENDA) 5 MG tablet TAKE 1 TABLET BY MOUTH EVERYDAY AT BEDTIME 90 tablet 1   metoprolol tartrate (LOPRESSOR) 100 MG tablet TAKE 1&1/2 TABLETS BY MOUTH TWICE A DAY 270 tablet 1   naproxen (NAPROSYN) 375 MG tablet TAKE 1 TABLET (375 MG TOTAL) BY MOUTH 2 (TWO) TIMES DAILY AS NEEDED FOR MODERATE PAIN OR HEADACHE. 60 tablet 3   nitroGLYCERIN (NITROSTAT) 0.4 MG SL tablet PLACE 1 TABLET UNDER THE TONGUE EVERY 5 MINUTES AS NEEDED FOR CHEST PAIN 25 tablet 1   omeprazole (PRILOSEC) 40 MG  capsule TAKE 1 CAPSULE BY MOUTH TWICE A DAY 180 capsule 1   polyethylene glycol (MIRALAX / GLYCOLAX) 17 g packet Take 17 g by mouth daily.     Psyllium-Calcium (METAMUCIL PLUS CALCIUM) CAPS daily.     rosuvastatin (CRESTOR) 40 MG tablet Take 1 tablet (40 mg total) by mouth daily. 90 tablet 1   temazepam  (RESTORIL) 7.5 MG capsule 1 po every other day for 1 week, then every third day for 1 week. 7 capsule 0   No facility-administered medications prior to visit.    Allergies  Allergen Reactions   Neomycin-Bacitracin Zn-Polymyx Rash    Polysporin- is tolerated    Niacin Other (See Comments) and Cough    "cough til I threw up" (12/19/2012)   Ciprofloxacin Hives    Got cipro and flagyl at same time, localized hives to IV arm   Flagyl [Metronidazole] Hives    Got cipro and flagyl at same time, localized hives to IV arm   Neomycin Other (See Comments)    Review of Systems  Constitutional:  Negative for chills and fever.  HENT:  Negative for congestion.   Respiratory:  Negative for shortness of breath.   Cardiovascular:  Negative for chest pain and palpitations.  Gastrointestinal:  Positive for nausea.  Skin:           Neurological:  Negative for headaches.       Objective:    Physical Exam Constitutional:      General: She is not in acute distress.    Appearance: Normal appearance. She is normal weight. She is not ill-appearing.  HENT:     Head: Normocephalic and atraumatic.     Right Ear: External ear normal.     Left Ear: External ear normal.     Nose: Nose normal.     Mouth/Throat:     Mouth: Mucous membranes are dry.     Pharynx: Oropharynx is clear.  Eyes:     General:        Right eye: No discharge.        Left eye: No discharge.     Extraocular Movements: Extraocular movements intact.     Conjunctiva/sclera: Conjunctivae normal.     Pupils: Pupils are equal, round, and reactive to light.  Cardiovascular:     Rate and Rhythm: Normal rate and regular rhythm.     Pulses: Normal pulses.     Heart sounds: Normal heart sounds. No murmur heard.    No gallop.  Pulmonary:     Effort: Pulmonary effort is normal. No respiratory distress.     Breath sounds: Normal breath sounds. No wheezing or rales.  Abdominal:     General: Bowel sounds are normal.     Palpations:  Abdomen is soft.     Tenderness: There is no abdominal tenderness. There is no guarding.  Musculoskeletal:        General: Normal range of motion.     Cervical back: Normal range of motion.     Right lower leg: No edema.     Left lower leg: No edema.  Skin:    General: Skin is warm and dry.  Neurological:     Mental Status: She is alert and oriented to person, place, and time.  Psychiatric:        Mood and Affect: Mood normal.        Behavior: Behavior normal.        Judgment: Judgment normal.     BP 126/72 (  BP Location: Right Arm, Patient Position: Sitting, Cuff Size: Normal)   Pulse 63   Temp 98 F (36.7 C) (Oral)   Resp 16   Ht '5\' 2"'$  (1.575 m)   Wt 176 lb 12.8 oz (80.2 kg)   SpO2 95%   BMI 32.34 kg/m  Wt Readings from Last 3 Encounters:  02/01/23 176 lb 12.8 oz (80.2 kg)  01/20/23 171 lb (77.6 kg)  01/05/23 182 lb (82.6 kg)    Diabetic Foot Exam - Simple   No data filed    Lab Results  Component Value Date   WBC 10.7 (H) 02/01/2023   HGB 12.2 02/01/2023   HCT 35.8 (L) 02/01/2023   PLT 356.0 02/01/2023   GLUCOSE 124 (H) 02/01/2023   CHOL 133 02/01/2023   TRIG 143.0 02/01/2023   HDL 54.30 02/01/2023   LDLDIRECT 81.0 02/12/2020   LDLCALC 51 02/01/2023   ALT 18 02/01/2023   AST 17 02/01/2023   NA 132 (L) 02/01/2023   K 4.0 02/01/2023   CL 93 (L) 02/01/2023   CREATININE 0.90 02/01/2023   BUN 19 02/01/2023   CO2 27 02/01/2023   TSH 3.71 02/01/2023   INR 1.0 01/12/2021   HGBA1C 5.8 10/12/2022    Lab Results  Component Value Date   TSH 3.71 02/01/2023   Lab Results  Component Value Date   WBC 10.7 (H) 02/01/2023   HGB 12.2 02/01/2023   HCT 35.8 (L) 02/01/2023   MCV 87.6 02/01/2023   PLT 356.0 02/01/2023   Lab Results  Component Value Date   NA 132 (L) 02/01/2023   K 4.0 02/01/2023   CO2 27 02/01/2023   GLUCOSE 124 (H) 02/01/2023   BUN 19 02/01/2023   CREATININE 0.90 02/01/2023   BILITOT 0.3 02/01/2023   ALKPHOS 71 02/01/2023   AST 17  02/01/2023   ALT 18 02/01/2023   PROT 6.2 02/01/2023   ALBUMIN 4.1 02/01/2023   CALCIUM 8.9 02/01/2023   ANIONGAP 8 01/20/2021   GFR 61.24 02/01/2023   Lab Results  Component Value Date   CHOL 133 02/01/2023   Lab Results  Component Value Date   HDL 54.30 02/01/2023   Lab Results  Component Value Date   LDLCALC 51 02/01/2023   Lab Results  Component Value Date   TRIG 143.0 02/01/2023   Lab Results  Component Value Date   CHOLHDL 2 02/01/2023   Lab Results  Component Value Date   HGBA1C 5.8 10/12/2022      Assessment & Plan:  Immunizations: Reviewed patient's immunization history and encouraged RSV vaccination.  Labs: Routine blood work including thyroid markers and vitamin D completed.  Nausea/Stomach Sickness: Encouraged hydration and recommended ginger ale,  ginger capsules, and BRAT diet. Prescribed Ondansetron 4 mg. Problem List Items Addressed This Visit     Anemia - Primary    Increase leafy greens, consider increased lean red meat and using cast iron cookware. Continue to monitor, report any concerns       Relevant Orders   CBC with Differential/Platelet (Completed)   Asthma, mild intermittent    No recent exacerbation      Essential hypertension    Well controlled, no changes to meds. Encouraged heart healthy diet such as the DASH diet and exercise as tolerated.        Relevant Orders   Comprehensive metabolic panel (Completed)   Gastroenteritis    She and her son were very ill last week with n/v/d and he has recovered but she continues  to struggle with nausea and has not started eating or drinking well. She is dehydrated at todays visit. She is given ondansetron to use prn. Encouraged to push po fluids and maintain a bland BRAT diet this week.      Hyperglycemia    hgba1c acceptable, minimize simple carbs. Increase exercise as tolerated.       Hyperlipidemia, mixed    Encourage heart healthy diet such as MIND or DASH diet, increase exercise,  avoid trans fats, simple carbohydrates and processed foods, consider a krill or fish or flaxseed oil cap daily. Tolerating Rosuvastatin      Relevant Orders   Lipid panel (Completed)   Hypothyroidism    On Levothyroxine, continue to monitor      Relevant Orders   TSH (Completed)   T4, free (Completed)   Memory impairment    Accompanied by her son and they report her memory is stable      Vitamin D deficiency   Relevant Orders   VITAMIN D 25 Hydroxy (Vit-D Deficiency, Fractures) (Completed)   Meds ordered this encounter  Medications   ondansetron (ZOFRAN) 4 MG tablet    Sig: Take 1 tablet (4 mg total) by mouth every 8 (eight) hours as needed for nausea or vomiting.    Dispense:  30 tablet    Refill:  1   I, Penni Homans, MD, personally preformed the services described in this documentation.  All medical record entries made by the scribe were at my direction and in my presence.  I have reviewed the chart and discharge instructions (if applicable) and agree that the record reflects my personal performance and is accurate and complete. 02/01/2023  I,Mohammed Iqbal,acting as a scribe for Penni Homans, MD.,have documented all relevant documentation on the behalf of Penni Homans, MD,as directed by  Penni Homans, MD while in the presence of Penni Homans, MD.  Penni Homans, MD

## 2023-02-01 NOTE — Patient Instructions (Addendum)
RSV, Respiratory Virus vaccination, Arexvy at pharmacy  Ginger ale or ginger capsules, Ginger tea  BRAT bananas, rice, applesauce toast, lean proteins like chicken without skin  Nausea, Adult Nausea is the feeling of having an upset stomach or that you are about to vomit. Nausea on its own is not usually a serious concern, but it may be an early sign of a more serious medical problem. As nausea gets worse, it can lead to vomiting. If vomiting develops, or if you are not able to drink enough fluids, you are at risk of becoming dehydrated. Dehydration can make you tired and thirsty, cause you to have a dry mouth, and decrease how often you urinate. Older adults and people with other diseases or a weak disease-fighting system (immune system) are at higher risk for dehydration. The main goals of treating your nausea are: To relieve your nausea. To limit repeated nausea episodes. To prevent vomiting and dehydration. Follow these instructions at home: Watch your symptoms for any changes. Tell your health care provider about them. Eating and drinking     Take an oral rehydration solution (ORS). This is a drink that is sold at pharmacies and retail stores. Drink clear fluids slowly and in small amounts as you are able. Clear fluids include water, ice chips, low-calorie sports drinks, and fruit juice that has water added (diluted fruit juice). Eat bland, easy-to-digest foods in small amounts as you are able. These foods include bananas, applesauce, rice, lean meats, toast, and crackers. Avoid drinking fluids that contain a lot of sugar or caffeine, such as energy drinks, sports drinks, and soda. Avoid alcohol. Avoid spicy or fatty foods. General instructions Take over-the-counter and prescription medicines only as told by your health care provider. Rest at home while you recover. Drink enough fluid to keep your urine pale yellow. Breathe slowly and deeply when you feel nauseous. Avoid smelling  things that have strong odors. Wash your hands often using soap and water for at least 20 seconds. If soap and water are not available, use hand sanitizer. Make sure that everyone in your household washes their hands well and often. Keep all follow-up visits. This is important. Contact a health care provider if: Your nausea gets worse. Your nausea does not go away after two days. You vomit multiple times. You cannot drink fluids without vomiting. You have any of the following: New symptoms. A fever. A headache. Muscle cramps. A rash. Pain while urinating. You feel light-headed or dizzy. Get help right away if: You have pain in your chest, neck, arm, or jaw. You feel extremely weak or you faint. You have vomit that is bright red or looks like coffee grounds. You have bloody or black stools (feces) or stools that look like tar. You have a severe headache, a stiff neck, or both. You have severe pain, cramping, or bloating in your abdomen. You have difficulty breathing or are breathing very quickly. Your heart is beating very quickly. Your skin feels cold and clammy. You feel confused. You have signs of dehydration, such as: Dark urine, very little urine, or no urine. Cracked lips. Dry mouth. Sunken eyes. Sleepiness. Weakness. These symptoms may be an emergency. Get help right away. Call 911. Do not wait to see if the symptoms will go away. Do not drive yourself to the hospital. Summary Nausea is the feeling that you have an upset stomach or that you are about to vomit. Nausea on its own is not usually a serious concern, but it may be  an early sign of a more serious medical problem. If vomiting develops, or if you are not able to drink enough fluids, you are at risk of becoming dehydrated. Follow recommendations for eating and drinking and take over-the-counter and prescription medicines only as told by your health care provider. Contact a health care provider right away if your  symptoms worsen or you have new symptoms. Keep all follow-up visits. This is important. This information is not intended to replace advice given to you by your health care provider. Make sure you discuss any questions you have with your health care provider. Document Revised: 06/05/2021 Document Reviewed: 06/05/2021 Elsevier Patient Education  [employer].

## 2023-02-02 LAB — COMPREHENSIVE METABOLIC PANEL
ALT: 18 U/L (ref 0–35)
AST: 17 U/L (ref 0–37)
Albumin: 4.1 g/dL (ref 3.5–5.2)
Alkaline Phosphatase: 71 U/L (ref 39–117)
BUN: 19 mg/dL (ref 6–23)
CO2: 27 mEq/L (ref 19–32)
Calcium: 8.9 mg/dL (ref 8.4–10.5)
Chloride: 93 mEq/L — ABNORMAL LOW (ref 96–112)
Creatinine, Ser: 0.9 mg/dL (ref 0.40–1.20)
GFR: 61.24 mL/min (ref >60.00)
Glucose, Bld: 124 mg/dL — ABNORMAL HIGH (ref 70–99)
Potassium: 4 mEq/L (ref 3.5–5.1)
Sodium: 132 mEq/L — ABNORMAL LOW (ref 135–145)
Total Bilirubin: 0.3 mg/dL (ref 0.2–1.2)
Total Protein: 6.2 g/dL (ref 6.0–8.3)

## 2023-02-02 LAB — CBC WITH DIFFERENTIAL/PLATELET
Basophils Absolute: 0.1 10*3/uL (ref 0.0–0.1)
Basophils Relative: 0.7 % (ref 0.0–3.0)
Eosinophils Absolute: 0.1 10*3/uL (ref 0.0–0.7)
Eosinophils Relative: 1.1 % (ref 0.0–5.0)
HCT: 35.8 % — ABNORMAL LOW (ref 36.0–46.0)
Hemoglobin: 12.2 g/dL (ref 12.0–15.0)
Lymphocytes Relative: 17.7 % (ref 12.0–46.0)
Lymphs Abs: 1.9 10*3/uL (ref 0.7–4.0)
MCHC: 34.1 g/dL (ref 30.0–36.0)
MCV: 87.6 fl (ref 78.0–100.0)
Monocytes Absolute: 1 10*3/uL (ref 0.1–1.0)
Monocytes Relative: 9.1 % (ref 3.0–12.0)
Neutro Abs: 7.6 10*3/uL (ref 1.4–7.7)
Neutrophils Relative %: 71.4 % (ref 43.0–77.0)
Platelets: 356 10*3/uL (ref 150.0–400.0)
RBC: 4.08 Mil/uL (ref 3.87–5.11)
RDW: 13.4 % (ref 11.5–15.5)
WBC: 10.7 10*3/uL — ABNORMAL HIGH (ref 4.0–10.5)

## 2023-02-02 LAB — LIPID PANEL
Cholesterol: 133 mg/dL (ref 0–200)
HDL: 54.3 mg/dL (ref >39.00)
LDL Cholesterol: 51 mg/dL (ref 0–99)
NonHDL: 79.13
Total CHOL/HDL Ratio: 2
Triglycerides: 143 mg/dL (ref 0.0–149.0)
VLDL: 28.6 mg/dL (ref 0.0–40.0)

## 2023-02-02 LAB — T4, FREE: Free T4: 1.57 ng/dL (ref 0.60–1.60)

## 2023-02-02 LAB — TSH: TSH: 3.71 u[IU]/mL (ref 0.35–5.50)

## 2023-02-02 LAB — VITAMIN D 25 HYDROXY (VIT D DEFICIENCY, FRACTURES): VITD: 59.31 ng/mL (ref 30.00–100.00)

## 2023-02-03 ENCOUNTER — Other Ambulatory Visit: Payer: Self-pay

## 2023-02-03 DIAGNOSIS — I10 Essential (primary) hypertension: Secondary | ICD-10-CM

## 2023-02-03 DIAGNOSIS — D649 Anemia, unspecified: Secondary | ICD-10-CM

## 2023-02-06 NOTE — Assessment & Plan Note (Signed)
Well controlled, no changes to meds. Encouraged heart healthy diet such as the DASH diet and exercise as tolerated.  °

## 2023-02-06 NOTE — Assessment & Plan Note (Signed)
Increase leafy greens, consider increased lean red meat and using cast iron cookware. Continue to monitor, report any concerns 

## 2023-02-06 NOTE — Assessment & Plan Note (Signed)
hgba1c acceptable, minimize simple carbs. Increase exercise as tolerated.  

## 2023-02-06 NOTE — Assessment & Plan Note (Signed)
On Levothyroxine, continue to monitor 

## 2023-02-06 NOTE — Assessment & Plan Note (Signed)
Accompanied by her son and they report her memory is stable

## 2023-02-06 NOTE — Assessment & Plan Note (Signed)
She and her son were very ill last week with n/v/d and he has recovered but she continues to struggle with nausea and has not started eating or drinking well. She is dehydrated at todays visit. She is given ondansetron to use prn. Encouraged to push po fluids and maintain a bland BRAT diet this week.

## 2023-02-06 NOTE — Assessment & Plan Note (Signed)
Encourage heart healthy diet such as MIND or DASH diet, increase exercise, avoid trans fats, simple carbohydrates and processed foods, consider a krill or fish or flaxseed oil cap daily. Tolerating Rosuvastatin

## 2023-02-06 NOTE — Assessment & Plan Note (Signed)
No recent exacerbation 

## 2023-02-14 ENCOUNTER — Other Ambulatory Visit: Payer: Medicare HMO

## 2023-02-14 DIAGNOSIS — D649 Anemia, unspecified: Secondary | ICD-10-CM

## 2023-02-15 ENCOUNTER — Telehealth: Payer: Self-pay | Admitting: *Deleted

## 2023-02-15 DIAGNOSIS — D649 Anemia, unspecified: Secondary | ICD-10-CM

## 2023-02-15 NOTE — Telephone Encounter (Signed)
Received call from lab that IFOB sample pt returned yesterday had the 02/11/23 expiration date and they are not able to complete testing on it. Sample needs to be re-collected. Ifob kits are on current back order.  We do have the 3 pack hemoccult cards if you would like pt to do that or we can place her on a wait list for the IFOB kit if you prefer? Please advise.

## 2023-02-16 NOTE — Telephone Encounter (Signed)
Notified pt's son that we have the 3 card hemoccult pack in the office for pick up and we will go over collection instructions when pack is picked up. He states he will pick it up on Monday, 02/20/22.

## 2023-02-17 ENCOUNTER — Encounter: Payer: Self-pay | Admitting: Radiology

## 2023-02-20 ENCOUNTER — Other Ambulatory Visit: Payer: Self-pay | Admitting: Family Medicine

## 2023-02-27 ENCOUNTER — Other Ambulatory Visit: Payer: Self-pay | Admitting: Family Medicine

## 2023-02-27 DIAGNOSIS — E038 Other specified hypothyroidism: Secondary | ICD-10-CM

## 2023-03-01 ENCOUNTER — Other Ambulatory Visit (INDEPENDENT_AMBULATORY_CARE_PROVIDER_SITE_OTHER): Payer: Medicare HMO

## 2023-03-01 DIAGNOSIS — I10 Essential (primary) hypertension: Secondary | ICD-10-CM

## 2023-03-01 DIAGNOSIS — D649 Anemia, unspecified: Secondary | ICD-10-CM

## 2023-03-01 LAB — CBC WITH DIFFERENTIAL/PLATELET
Basophils Absolute: 0 10*3/uL (ref 0.0–0.1)
Basophils Relative: 0.5 % (ref 0.0–3.0)
Eosinophils Absolute: 0.1 10*3/uL (ref 0.0–0.7)
Eosinophils Relative: 1.9 % (ref 0.0–5.0)
HCT: 35.7 % — ABNORMAL LOW (ref 36.0–46.0)
Hemoglobin: 11.9 g/dL — ABNORMAL LOW (ref 12.0–15.0)
Lymphocytes Relative: 14.2 % (ref 12.0–46.0)
Lymphs Abs: 0.9 10*3/uL (ref 0.7–4.0)
MCHC: 33.3 g/dL (ref 30.0–36.0)
MCV: 89.1 fl (ref 78.0–100.0)
Monocytes Absolute: 0.6 10*3/uL (ref 0.1–1.0)
Monocytes Relative: 9.5 % (ref 3.0–12.0)
Neutro Abs: 4.5 10*3/uL (ref 1.4–7.7)
Neutrophils Relative %: 73.9 % (ref 43.0–77.0)
Platelets: 300 10*3/uL (ref 150.0–400.0)
RBC: 4 Mil/uL (ref 3.87–5.11)
RDW: 13.8 % (ref 11.5–15.5)
WBC: 6.1 10*3/uL (ref 4.0–10.5)

## 2023-03-05 ENCOUNTER — Other Ambulatory Visit: Payer: Self-pay | Admitting: Family Medicine

## 2023-03-21 ENCOUNTER — Other Ambulatory Visit: Payer: Medicare HMO

## 2023-03-21 DIAGNOSIS — D649 Anemia, unspecified: Secondary | ICD-10-CM | POA: Diagnosis not present

## 2023-03-21 NOTE — Assessment & Plan Note (Signed)
No recent exacerbation 

## 2023-03-21 NOTE — Assessment & Plan Note (Signed)
Here today with her son and is following with neurology

## 2023-03-21 NOTE — Assessment & Plan Note (Signed)
On Levothyroxine, continue to monitor 

## 2023-03-21 NOTE — Assessment & Plan Note (Signed)
Encourage heart healthy diet such as MIND or DASH diet, increase exercise, avoid trans fats, simple carbohydrates and processed foods, consider a krill or fish or flaxseed oil cap daily. Tolerating Rosuvastatin 

## 2023-03-21 NOTE — Assessment & Plan Note (Signed)
Hydrate and monitor 

## 2023-03-21 NOTE — Assessment & Plan Note (Signed)
Well controlled, no changes to meds. Encouraged heart healthy diet such as the DASH diet and exercise as tolerated.  °

## 2023-03-21 NOTE — Assessment & Plan Note (Signed)
hgba1c acceptable, minimize simple carbs. Increase exercise as tolerated. Continue current meds 

## 2023-03-22 ENCOUNTER — Ambulatory Visit (INDEPENDENT_AMBULATORY_CARE_PROVIDER_SITE_OTHER): Payer: Medicare HMO | Admitting: Family Medicine

## 2023-03-22 VITALS — BP 130/74 | HR 60 | Temp 97.5°F | Resp 16 | Ht 62.0 in | Wt 177.6 lb

## 2023-03-22 DIAGNOSIS — E559 Vitamin D deficiency, unspecified: Secondary | ICD-10-CM

## 2023-03-22 DIAGNOSIS — R69 Illness, unspecified: Secondary | ICD-10-CM | POA: Diagnosis not present

## 2023-03-22 DIAGNOSIS — Z1211 Encounter for screening for malignant neoplasm of colon: Secondary | ICD-10-CM | POA: Diagnosis not present

## 2023-03-22 DIAGNOSIS — E038 Other specified hypothyroidism: Secondary | ICD-10-CM

## 2023-03-22 DIAGNOSIS — E782 Mixed hyperlipidemia: Secondary | ICD-10-CM | POA: Diagnosis not present

## 2023-03-22 DIAGNOSIS — D649 Anemia, unspecified: Secondary | ICD-10-CM

## 2023-03-22 DIAGNOSIS — J4520 Mild intermittent asthma, uncomplicated: Secondary | ICD-10-CM | POA: Diagnosis not present

## 2023-03-22 DIAGNOSIS — R739 Hyperglycemia, unspecified: Secondary | ICD-10-CM

## 2023-03-22 DIAGNOSIS — E538 Deficiency of other specified B group vitamins: Secondary | ICD-10-CM | POA: Diagnosis not present

## 2023-03-22 DIAGNOSIS — F0394 Unspecified dementia, unspecified severity, with anxiety: Secondary | ICD-10-CM | POA: Diagnosis not present

## 2023-03-22 DIAGNOSIS — J069 Acute upper respiratory infection, unspecified: Secondary | ICD-10-CM | POA: Diagnosis not present

## 2023-03-22 DIAGNOSIS — I10 Essential (primary) hypertension: Secondary | ICD-10-CM | POA: Diagnosis not present

## 2023-03-22 LAB — CBC WITH DIFFERENTIAL/PLATELET
Basophils Absolute: 0 10*3/uL (ref 0.0–0.1)
Basophils Relative: 0.5 % (ref 0.0–3.0)
Eosinophils Absolute: 0.2 10*3/uL (ref 0.0–0.7)
Eosinophils Relative: 2 % (ref 0.0–5.0)
HCT: 36 % (ref 36.0–46.0)
Hemoglobin: 11.9 g/dL — ABNORMAL LOW (ref 12.0–15.0)
Lymphocytes Relative: 16.1 % (ref 12.0–46.0)
Lymphs Abs: 1.4 10*3/uL (ref 0.7–4.0)
MCHC: 33.1 g/dL (ref 30.0–36.0)
MCV: 89 fl (ref 78.0–100.0)
Monocytes Absolute: 0.7 10*3/uL (ref 0.1–1.0)
Monocytes Relative: 8.6 % (ref 3.0–12.0)
Neutro Abs: 6.3 10*3/uL (ref 1.4–7.7)
Neutrophils Relative %: 72.8 % (ref 43.0–77.0)
Platelets: 289 10*3/uL (ref 150.0–400.0)
RBC: 4.04 Mil/uL (ref 3.87–5.11)
RDW: 15 % (ref 11.5–15.5)
WBC: 8.7 10*3/uL (ref 4.0–10.5)

## 2023-03-22 NOTE — Progress Notes (Signed)
Subjective:   By signing my name below, I, Vickey Sages, attest that this documentation has been prepared under the direction and in the presence of Bradd Canary, MD., 03/22/2023.   Patient ID: Jackie Stevens, female    DOB: Dec 29, 1945, 77 y.o.   MRN: [id number]  Chief Complaint  Patient presents with   Follow-up    Follow up   HPI Patient is in today for an office visit and is accompanied by her son.   Arthritic Pain Patient complains of arthritic right shoulder and right hip pain. She denies that this is keeping her awake at night and is taking Naproxen 375 mg three times daily and Extra Strength Tylenol as needed.  Cough/Congestion Patient reports that she fell ill several weeks ago with cough and congestion, but without fever or chills. She is doing better today but still feels mildly congested and has a lingering cough. She denies CP/palpitations/SOB/HA/ fever/chills/GI or GU symptoms.  Past Medical History:  Diagnosis Date   Abnormal cervical cytology 10/25/2012   Follows with Dr Maggie Font of Gyn   Allergic rhinitis 11/04/2010   Qualifier: Diagnosis of  By: Maple Hudson MD, Clinton D    Anemia 10/06/2013   Anginal pain    pt has history of CP states had cardiac workup with no specific issues identified    Arthritis    knees; right thumb; shoulders   Arthritis of left shoulder region 09/30/2016   X-ray of the left shoulder on 09/14/2016: Marked degenerative change with significant osteophytic spurring and subchondral sclerosis.  Loss of glenohumeral joint space.  Well-maintained subacromial space.  Injected 09/30/2016 Katrinka Blazing .   Asthma, mild intermittent 04/02/2013   Chicken pox as a child   Colonic diverticular abscess 11/21/2012   Constipation    Decreased hearing    Dermatitis 03/06/2017   Diverticulitis 10/25/2012   pt. reports that a drain was placed - 09/2012     Diverticulitis of rectosigmoid 11/28/2012   Dry mouth    Essential hypertension 11/08/2010    Qualifier: Diagnosis of  By: Maple Hudson MD, Clinton D    Excessive thirst    External hemorrhoid, bleeding    "sometimes" (2012-12-22)   Frequent urination    GERD (gastroesophageal reflux disease)    H/O hiatal hernia    Hand tingling    Heart murmur    History of kidney stones    Hyperglycemia 01/17/2020   Hyperlipidemia    Hypothyroidism    Incontinence    Insomnia    Kidney stones 1970's   "passed on their own" (December 22, 2012)   Low back pain 06/09/2014   Measles as a child   Mild neurocognitive disorder 02/05/2020   Obesity 09/11/2017   Obstructive sleep apnea 11/04/2010   NPSG [facility] 07/15/10- AHI 13.5/ hr CPAP 10/APS     Otitis media 01/07/2014   Palpitations    Paresthesia 03/23/2015   Left face   PONV (postoperative nausea and vomiting)    Rheumatoid arteritis    Shortness of breath dyspnea    using stairs   Sinus pain    Swelling of both lower extremities    Thyroid cancer 1980's   Tinnitus    UTI (urinary tract infection) 04/02/2013   Vertigo 08/05/2019   Vitamin D deficiency 02/28/2018    Past Surgical History:  Procedure Laterality Date   CHOLECYSTECTOMY  1990   COLON SURGERY     COLOSTOMY REVISION  12-22-2012   Procedure: COLON RESECTION SIGMOID;  Surgeon: Cherylynn Ridges,  MD;  Location: [facility] OR;  Service: General;  Laterality: N/A;   CYSTOSCOPY WITH STENT PLACEMENT  12/19/2012   Procedure: CYSTOSCOPY WITH STENT PLACEMENT;  Surgeon: Lindaann Slough, MD;  Location: [facility] OR;  Service: Urology;  Laterality: N/A;   DILATION AND CURETTAGE OF UTERUS  1960's   "lots of them; had miscarriages" (12/19/2012)   LYSIS OF ADHESION N/A 12/02/2015   Procedure: LAPAROSCOPIC LYSIS OF ADHESION;  Surgeon: Luretha Murphy, MD;  Location: [facility];  Service: General;  Laterality: N/A;   ROBOTIC ASSISTED BILATERAL SALPINGO OOPHERECTOMY Bilateral 12/02/2015   Procedure: XI ROBOTIC ASSISTED BILATERAL SALPINGO OOPHORECTOMY;  Surgeon: Adolphus Birchwood, MD;  Location: [facility];  Service: Gynecology;  Laterality: Bilateral;    SIGMOID RESECTION / RECTOPEXY  12/19/2012   THYROIDECTOMY, PARTIAL  1988   "then did iodine to remove the rest" (12/19/2012)   TONSILLECTOMY  1951?   TOTAL KNEE ARTHROPLASTY Left 01/19/2021   Procedure: TOTAL KNEE ARTHROPLASTY;  Surgeon: Ollen Gross, MD;  Location: [facility];  Service: Orthopedics;  Laterality: Left;    TRANSRECTAL DRAINAGE OF PELVIC ABSCESS  10/27/2012   VAGINAL HYSTERECTOMY  1970's   "still have my ovaries" (12/19/2012)    Family History  Problem Relation Age of Onset   Heart disease Father    Pneumonia Father    Hypertension Father    Hyperlipidemia Father    Cancer Father        skin   Stroke Father    Alzheimer's disease Mother    Heart disease Mother    Depression Mother    Emphysema Brother        marijuana and cigarettes   Alcohol abuse Brother    Hearing loss Brother    Diabetes Maternal Grandmother    Alzheimer's disease Paternal Grandmother    Cancer Paternal Grandmother        lung?- smoker   Hyperlipidemia Paternal Grandmother    Heart attack Paternal Grandfather    Alcohol abuse Paternal Grandfather    Neurofibromatosis Son        schwanomatosis   Neurofibromatosis Son        swanomatosis   Cancer Paternal Aunt     Social History   Socioeconomic History   Marital status: Widowed    Spouse name: Kember Boch   Number of children: 2   Years of education: 12   Highest education level: High school graduate  Occupational History   Occupation: Retired  Tobacco Use   Smoking status: Never    Passive exposure: Never   Smokeless tobacco: Never   Tobacco comments:    Verified by Alain Honey  Vaping Use   Vaping Use: Never used  Substance and Sexual Activity   Alcohol use: No    Alcohol/week: 0.0 standard drinks of alcohol   Drug use: No   Sexual activity: Not Currently  Other Topics Concern   Not on file  Social History Narrative   Married - (husband passed )   Children   Right handed   Some college   Retired   3 sons    Social Determinants of Health   Financial Resource Strain: Low Risk  (01/05/2023)   Overall Financial Resource Strain (CARDIA)    Difficulty of Paying Living Expenses: Not hard at all  Food Insecurity: No Food Insecurity (01/05/2023)   Hunger Vital Sign    Worried About Running Out of Food in the Last Year: Never true    Ran Out of Food in the Last Year:  Never true  Transportation Needs: No Transportation Needs (01/05/2023)   PRAPARE - Administrator, Civil Service (Medical): No    Lack of Transportation (Non-Medical): No  Physical Activity: Insufficiently Active (01/05/2023)   Exercise Vital Sign    Days of Exercise per Week: 5 days    Minutes of Exercise per Session: 20 min  Stress: No Stress Concern Present (01/05/2023)   [employer] of Occupational Health - Occupational Stress Questionnaire    Feeling of Stress : Not at all  Social Connections: Moderately Isolated (01/05/2023)   Social Connection and Isolation Panel [NHANES]    Frequency of Communication with Friends and Family: More than three times a week    Frequency of Social Gatherings with Friends and Family: More than three times a week    Attends Religious Services: Never    Database administrator or Organizations: Yes    Attends Banker Meetings: 1 to 4 times per year    Marital Status: Widowed  Intimate Partner Violence: Not At Risk (01/05/2023)   Humiliation, Afraid, Rape, and Kick questionnaire    Fear of Current or Ex-Partner: No    Emotionally Abused: No    Physically Abused: No    Sexually Abused: No    Outpatient Medications Prior to Visit  Medication Sig Dispense Refill   albuterol (VENTOLIN HFA) 108 (90 Base) MCG/ACT inhaler Inhale 2 puffs into the lungs every 6 (six) hours as needed for wheezing or shortness of breath. 8 g 2   amLODipine (NORVASC) 2.5 MG tablet Take 1 tablet (2.5 mg total) by mouth daily. 90 tablet 1   Azelastine HCl 137 MCG/SPRAY SOLN PLACE 2 SPRAYS INTO BOTH  NOSTRILS 2 (TWO) TIMES DAILY. 90 mL 4   benzonatate (TESSALON PERLES) 100 MG capsule Take 1 capsule (100 mg total) by mouth 3 (three) times daily as needed for cough. 30 capsule 2   Black Pepper-Turmeric (TURMERIC CURCUMIN) 04-999 MG CAPS 1 tablet daily.     carboxymethylcellulose (REFRESH PLUS) 0.5 % SOLN Place 1 drop into both eyes 3 (three) times daily as needed (dry eyes).     Cholecalciferol-Vitamin C (VITAMIN D3-VITAMIN C) 1000-500 UNIT-MG CAPS daily.     docusate sodium (COLACE) 100 MG capsule Take 100 mg by mouth daily.     donepezil (ARICEPT) 10 MG tablet Take one tab daily 30 tablet 11   famotidine (PEPCID) 20 MG tablet Take 2 tablets (40 mg total) by mouth daily. 180 tablet 1   FLUoxetine (PROZAC) 10 MG tablet Take 1 tablet (10 mg total) by mouth daily. 90 tablet 1   gabapentin (NEURONTIN) 300 MG capsule TAKE 1 CAPSULE BY MOUTH EVERYDAY AT BEDTIME 30 capsule 1   hydrochlorothiazide (HYDRODIURIL) 25 MG tablet TAKE 1 TABLET (25 MG TOTAL) BY MOUTH DAILY. 90 tablet 1   levothyroxine (SYNTHROID) 125 MCG tablet Take 1 tablet (125 mcg total) by mouth daily before breakfast. 90 tablet 1   losartan (COZAAR) 100 MG tablet TAKE 1 TABLET BY MOUTH EVERYDAY AT BEDTIME 90 tablet 1   memantine (NAMENDA) 5 MG tablet TAKE 1 TABLET BY MOUTH EVERYDAY AT BEDTIME 90 tablet 1   metoprolol tartrate (LOPRESSOR) 100 MG tablet Take 1.5 tablets (150 mg total) by mouth 2 (two) times daily. 270 tablet 1   naproxen (NAPROSYN) 375 MG tablet TAKE 1 TABLET (375 MG TOTAL) BY MOUTH 2 (TWO) TIMES DAILY AS NEEDED FOR MODERATE PAIN OR HEADACHE. 60 tablet 3   nitroGLYCERIN (NITROSTAT) 0.4 MG SL tablet  PLACE 1 TABLET UNDER THE TONGUE EVERY 5 MINUTES AS NEEDED FOR CHEST PAIN 25 tablet 1   omeprazole (PRILOSEC) 40 MG capsule TAKE 1 CAPSULE BY MOUTH TWICE A DAY 180 capsule 1   ondansetron (ZOFRAN) 4 MG tablet Take 1 tablet (4 mg total) by mouth every 8 (eight) hours as needed for nausea or vomiting. 30 tablet 1   polyethylene  glycol (MIRALAX / GLYCOLAX) 17 g packet Take 17 g by mouth daily.     Psyllium-Calcium (METAMUCIL PLUS CALCIUM) CAPS daily.     rosuvastatin (CRESTOR) 40 MG tablet Take 1 tablet (40 mg total) by mouth daily. 90 tablet 1   temazepam (RESTORIL) 7.5 MG capsule 1 po every other day for 1 week, then every third day for 1 week. 7 capsule 0   No facility-administered medications prior to visit.    Allergies  Allergen Reactions   Neomycin-Bacitracin Zn-Polymyx Rash    Polysporin- is tolerated    Niacin Other (See Comments) and Cough    "cough til I threw up" (12/19/2012)   Ciprofloxacin Hives    Got cipro and flagyl at same time, localized hives to IV arm   Flagyl [Metronidazole] Hives    Got cipro and flagyl at same time, localized hives to IV arm   Neomycin Other (See Comments)    Review of Systems  Constitutional:  Negative for chills and fever.  HENT:  Positive for congestion.   Respiratory:  Positive for cough. Negative for shortness of breath.   Cardiovascular:  Negative for chest pain and palpitations.  Gastrointestinal:  Negative for abdominal pain, blood in stool, constipation, diarrhea, nausea and vomiting.  Genitourinary:  Negative for dysuria, frequency, hematuria and urgency.  Musculoskeletal:        (+) right shoulder pain (+) right hip pain  Skin:           Neurological:  Negative for headaches.       Objective:    Physical Exam Constitutional:      General: She is not in acute distress.    Appearance: Normal appearance. She is not ill-appearing.  HENT:     Head: Normocephalic and atraumatic.     Right Ear: External ear normal.     Left Ear: External ear normal.     Nose: Nose normal.     Mouth/Throat:     Mouth: Mucous membranes are moist.     Pharynx: Oropharynx is clear.  Eyes:     General: Lids are normal.        Right eye: No discharge.        Left eye: No discharge.     Extraocular Movements: Extraocular movements intact.     Conjunctiva/sclera:  Conjunctivae normal.     Pupils: Pupils are equal, round, and reactive to light.  Cardiovascular:     Rate and Rhythm: Normal rate and regular rhythm.     Pulses: Normal pulses.     Heart sounds: Normal heart sounds. No murmur heard.    No gallop.  Pulmonary:     Effort: Pulmonary effort is normal. No respiratory distress.     Breath sounds: Normal breath sounds. No wheezing or rales.  Abdominal:     General: Bowel sounds are normal.     Palpations: Abdomen is soft.     Tenderness: There is no abdominal tenderness. There is no guarding.  Musculoskeletal:        General: Normal range of motion.     Cervical back: Normal range  of motion.     Right lower leg: No edema.     Left lower leg: No edema.  Skin:    General: Skin is warm and dry.  Neurological:     Mental Status: She is alert and oriented to person, place, and time.  Psychiatric:        Mood and Affect: Mood normal.        Behavior: Behavior normal.        Judgment: Judgment normal.     BP 130/74 (BP Location: Right Arm, Patient Position: Sitting, Cuff Size: Normal)   Pulse 60   Temp (!) 97.5 F (36.4 C) (Oral)   Resp 16   Ht 5\' 2"  (1.575 m)   Wt 177 lb 9.6 oz (80.6 kg)   SpO2 95%   BMI 32.48 kg/m  Wt Readings from Last 3 Encounters:  03/22/23 177 lb 9.6 oz (80.6 kg)  02/01/23 176 lb 12.8 oz (80.2 kg)  01/20/23 171 lb (77.6 kg)    Diabetic Foot Exam - Simple   No data filed    Lab Results  Component Value Date   WBC 8.7 03/22/2023   HGB 11.9 (L) 03/22/2023   HCT 36.0 03/22/2023   PLT 289.0 03/22/2023   GLUCOSE 124 (H) 02/01/2023   CHOL 133 02/01/2023   TRIG 143.0 02/01/2023   HDL 54.30 02/01/2023   LDLDIRECT 81.0 02/12/2020   LDLCALC 51 02/01/2023   ALT 18 02/01/2023   AST 17 02/01/2023   NA 132 (L) 02/01/2023   K 4.0 02/01/2023   CL 93 (L) 02/01/2023   CREATININE 0.90 02/01/2023   BUN 19 02/01/2023   CO2 27 02/01/2023   TSH 3.71 02/01/2023   INR 1.0 01/12/2021   HGBA1C 5.8 10/12/2022     Lab Results  Component Value Date   TSH 3.71 02/01/2023   Lab Results  Component Value Date   WBC 8.7 03/22/2023   HGB 11.9 (L) 03/22/2023   HCT 36.0 03/22/2023   MCV 89.0 03/22/2023   PLT 289.0 03/22/2023   Lab Results  Component Value Date   NA 132 (L) 02/01/2023   K 4.0 02/01/2023   CO2 27 02/01/2023   GLUCOSE 124 (H) 02/01/2023   BUN 19 02/01/2023   CREATININE 0.90 02/01/2023   BILITOT 0.3 02/01/2023   ALKPHOS 71 02/01/2023   AST 17 02/01/2023   ALT 18 02/01/2023   PROT 6.2 02/01/2023   ALBUMIN 4.1 02/01/2023   CALCIUM 8.9 02/01/2023   ANIONGAP 8 01/20/2021   GFR 61.24 02/01/2023   Lab Results  Component Value Date   CHOL 133 02/01/2023   Lab Results  Component Value Date   HDL 54.30 02/01/2023   Lab Results  Component Value Date   LDLCALC 51 02/01/2023   Lab Results  Component Value Date   TRIG 143.0 02/01/2023   Lab Results  Component Value Date   CHOLHDL 2 02/01/2023   Lab Results  Component Value Date   HGBA1C 5.8 10/12/2022      Assessment & Plan:  Arthritic Pain: Recommended moist heat and topical rubs such as Aspercreme and Biofreeze. Extra Strength Tylenol 500 mg tablets (1-2 tablets twice daily, maximum of 3000 mg in 24 hours). Naproxen 375 mg: take 1 tablet (375 mg total) two times daily.  Labs: Routine blood work ordered today will also check cbc and iron panel. Awaiting stool sample results. Problem List Items Addressed This Visit     Anemia   Relevant Orders   CBC  w/Diff (Completed)   Iron, TIBC and Ferritin Panel   Asthma, mild intermittent - Primary    No recent exacerbation      Dementia with anxiety    Here today with her son and is following with neurology      Essential hypertension    Well controlled, no changes to meds. Encouraged heart healthy diet such as the DASH diet and exercise as tolerated.        Hyperglycemia    hgba1c acceptable, minimize simple carbs. Increase exercise as tolerated. Continue  current meds       Hyperlipidemia, mixed    Encourage heart healthy diet such as MIND or DASH diet, increase exercise, avoid trans fats, simple carbohydrates and processed foods, consider a krill or fish or flaxseed oil cap daily. Tolerating Rosuvastatin      Hypothyroidism    On Levothyroxine, continue to monitor      URI (upper respiratory infection)    Improving but still coughing some with mucus production, increase rest and fluid intake consider Mucinex bid and report if symptoms worsen.      Vitamin B12 deficiency    Hydrate and monitor       Vitamin D deficiency    Hydrate and monitor       Other Visit Diagnoses     Colon cancer screening       Relevant Orders   Cologuard      No orders of the defined types were placed in this encounter.  I, Danise EdgeStacey Ranvir Renovato, MD, personally preformed the services described in this documentation.  All medical record entries made by the scribe were at my direction and in my presence.  I have reviewed the chart and discharge instructions (if applicable) and agree that the record reflects my personal performance and is accurate and complete.03/22/2023  I,Mohammed Iqbal,acting as a scribe for Danise EdgeStacey Raevon Broom, MD.,have documented all relevant documentation on the behalf of Danise EdgeStacey Hodari Chuba, MD,as directed by  Danise EdgeStacey Ahmya Bernick, MD while in the presence of Danise EdgeStacey Bliss Tsang, MD.  Danise EdgeStacey Aspen Deterding, MD

## 2023-03-22 NOTE — Patient Instructions (Addendum)
Moist heat and topical rubs such as biofreeze or aspercreme lidocaine  Tylenol/Acetraminophen ES 500 mg tabs 1-2 tabs twice daily max of 6 tabs or 3000 mg in 24 hours   Hip Pain The hip is the joint between the upper legs and the lower pelvis. The bones, cartilage, tendons, and muscles of your hip joint support your body and allow you to move around. Hip pain can range from a minor ache to severe pain in one or both of your hips. The pain may be felt on the inside of the hip joint near the groin, or on the outside near the buttocks and upper thigh. You may also have swelling or stiffness in your hip area. Follow these instructions at home: Managing pain, stiffness, and swelling     If told, put ice on the painful area. Put ice in a plastic bag. Place a towel between your skin and the bag. Leave the ice on for 20 minutes, 2-3 times a day. If told, apply heat to the affected area as often as told by your health care provider. Use the heat source that your provider recommends, such as a moist heat pack or a heating pad. Place a towel between your skin and the heat source. Leave the heat on for 20-30 minutes. If your skin turns bright red, remove the ice or heat right away to prevent skin damage. The risk of damage is higher if you cannot feel pain, heat, or cold. Activity Do exercises as told by your provider. Avoid activities that cause pain. General instructions  Take over-the-counter and prescription medicines only as told by your provider. Keep a journal of your symptoms. Write down: How often you have hip pain. The location of your pain. What the pain feels like. What makes the pain worse. Sleep with a pillow between your legs on your most comfortable side. Keep all follow-up visits. Your provider will monitor your pain and activity. Contact a health care provider if: You cannot put weight on your leg. Your pain or swelling gets worse after a week. It gets harder to walk. You  have a fever. Get help right away if: You fall. You have a sudden increase in pain and swelling in your hip. Your hip is red or swollen or very tender to touch. This information is not intended to replace advice given to you by your health care provider. Make sure you discuss any questions you have with your health care provider. Document Revised: 08/03/2022 Document Reviewed: 08/03/2022 Elsevier Patient Education  2023 [employer].

## 2023-03-22 NOTE — Assessment & Plan Note (Signed)
Improving but still coughing some with mucus production, increase rest and fluid intake consider Mucinex bid and report if symptoms worsen.

## 2023-03-23 ENCOUNTER — Other Ambulatory Visit: Payer: Self-pay | Admitting: Family

## 2023-03-23 ENCOUNTER — Other Ambulatory Visit: Payer: Self-pay

## 2023-03-23 DIAGNOSIS — D649 Anemia, unspecified: Secondary | ICD-10-CM

## 2023-03-23 LAB — IRON,TIBC AND FERRITIN PANEL
%SAT: 18 % (calc) (ref 16–45)
Ferritin: 15 ng/mL — ABNORMAL LOW (ref 16–288)
Iron: 71 ug/dL (ref 45–160)
TIBC: 398 mcg/dL (calc) (ref 250–450)

## 2023-03-23 LAB — FECAL OCCULT BLOOD, IMMUNOCHEMICAL: Fecal Occult Bld: POSITIVE — AB

## 2023-04-03 ENCOUNTER — Other Ambulatory Visit: Payer: Self-pay | Admitting: Physician Assistant

## 2023-04-05 DIAGNOSIS — G4733 Obstructive sleep apnea (adult) (pediatric): Secondary | ICD-10-CM | POA: Diagnosis not present

## 2023-04-18 ENCOUNTER — Other Ambulatory Visit: Payer: Medicare HMO

## 2023-04-26 ENCOUNTER — Telehealth: Payer: Self-pay

## 2023-04-26 ENCOUNTER — Other Ambulatory Visit (INDEPENDENT_AMBULATORY_CARE_PROVIDER_SITE_OTHER): Payer: Medicare HMO

## 2023-04-26 DIAGNOSIS — D649 Anemia, unspecified: Secondary | ICD-10-CM | POA: Diagnosis not present

## 2023-04-26 LAB — CBC WITH DIFFERENTIAL/PLATELET
Basophils Absolute: 0.1 10*3/uL (ref 0.0–0.1)
Basophils Relative: 0.9 % (ref 0.0–3.0)
Eosinophils Absolute: 0.2 10*3/uL (ref 0.0–0.7)
Eosinophils Relative: 3.3 % (ref 0.0–5.0)
HCT: 34.3 % — ABNORMAL LOW (ref 36.0–46.0)
Hemoglobin: 11.6 g/dL — ABNORMAL LOW (ref 12.0–15.0)
Lymphocytes Relative: 17.9 % (ref 12.0–46.0)
Lymphs Abs: 1.2 10*3/uL (ref 0.7–4.0)
MCHC: 33.9 g/dL (ref 30.0–36.0)
MCV: 89.1 fl (ref 78.0–100.0)
Monocytes Absolute: 0.6 10*3/uL (ref 0.1–1.0)
Monocytes Relative: 9.8 % (ref 3.0–12.0)
Neutro Abs: 4.4 10*3/uL (ref 1.4–7.7)
Neutrophils Relative %: 68.1 % (ref 43.0–77.0)
Platelets: 289 10*3/uL (ref 150.0–400.0)
RBC: 3.85 Mil/uL — ABNORMAL LOW (ref 3.87–5.11)
RDW: 14.7 % (ref 11.5–15.5)
WBC: 6.5 10*3/uL (ref 4.0–10.5)

## 2023-04-26 NOTE — Telephone Encounter (Signed)
He is on the way now for appt do not need to reschedule.

## 2023-04-26 NOTE — Telephone Encounter (Signed)
Caller Name NYALISE BANUELOS Caller Phone Number [phone] Patient Name Jackie Stevens Patient DOB [birth year] Call Type Message Only Information Provided Reason for Call Request to Reschedule Office Appointment Initial Comment The caller states he is needing to move his mother 900 am lab appt to later time in the day. Disp. Time Disposition Final User 04/25/2023 6:04:05 PM General Information Provided Yes Margo Aye, Bessie Call Closed By: Zebedee Iba Transaction Date/Time: 04/25/2023 6:00:56 PM (ET)

## 2023-04-27 ENCOUNTER — Other Ambulatory Visit: Payer: Self-pay

## 2023-04-27 ENCOUNTER — Other Ambulatory Visit: Payer: Self-pay | Admitting: Physician Assistant

## 2023-04-27 DIAGNOSIS — D649 Anemia, unspecified: Secondary | ICD-10-CM

## 2023-04-27 LAB — IRON,TIBC AND FERRITIN PANEL
%SAT: 18 % (calc) (ref 16–45)
Ferritin: 11 ng/mL — ABNORMAL LOW (ref 16–288)
Iron: 63 ug/dL (ref 45–160)
TIBC: 358 mcg/dL (calc) (ref 250–450)

## 2023-04-29 ENCOUNTER — Other Ambulatory Visit: Payer: Self-pay | Admitting: Family Medicine

## 2023-05-23 NOTE — Assessment & Plan Note (Signed)
Supplement and monitor 

## 2023-05-23 NOTE — Assessment & Plan Note (Signed)
Encourage heart healthy diet such as MIND or DASH diet, increase exercise, avoid trans fats, simple carbohydrates and processed foods, consider a krill or fish or flaxseed oil cap daily. Tolerating Rosuvastatin 

## 2023-05-23 NOTE — Assessment & Plan Note (Signed)
Accompanied by her son and following with neurology. They continue to be able to manage her ADLs at home.

## 2023-05-23 NOTE — Assessment & Plan Note (Signed)
On Levothyroxine, continue to monitor 

## 2023-05-23 NOTE — Assessment & Plan Note (Signed)
Well controlled, no changes to meds. Encouraged heart healthy diet such as the DASH diet and exercise as tolerated.  °

## 2023-05-24 ENCOUNTER — Encounter: Payer: Self-pay | Admitting: Gastroenterology

## 2023-05-24 ENCOUNTER — Ambulatory Visit (INDEPENDENT_AMBULATORY_CARE_PROVIDER_SITE_OTHER): Payer: Medicare HMO | Admitting: Family Medicine

## 2023-05-24 VITALS — BP 124/68 | HR 62 | Temp 98.0°F | Resp 16 | Ht 62.0 in | Wt 182.8 lb

## 2023-05-24 DIAGNOSIS — E538 Deficiency of other specified B group vitamins: Secondary | ICD-10-CM

## 2023-05-24 DIAGNOSIS — D649 Anemia, unspecified: Secondary | ICD-10-CM

## 2023-05-24 DIAGNOSIS — R739 Hyperglycemia, unspecified: Secondary | ICD-10-CM | POA: Diagnosis not present

## 2023-05-24 DIAGNOSIS — R42 Dizziness and giddiness: Secondary | ICD-10-CM

## 2023-05-24 DIAGNOSIS — I10 Essential (primary) hypertension: Secondary | ICD-10-CM

## 2023-05-24 DIAGNOSIS — E038 Other specified hypothyroidism: Secondary | ICD-10-CM | POA: Diagnosis not present

## 2023-05-24 DIAGNOSIS — E559 Vitamin D deficiency, unspecified: Secondary | ICD-10-CM | POA: Diagnosis not present

## 2023-05-24 DIAGNOSIS — E782 Mixed hyperlipidemia: Secondary | ICD-10-CM | POA: Diagnosis not present

## 2023-05-24 DIAGNOSIS — F0394 Unspecified dementia, unspecified severity, with anxiety: Secondary | ICD-10-CM

## 2023-05-24 LAB — CBC WITH DIFFERENTIAL/PLATELET
Basophils Absolute: 0.1 10*3/uL (ref 0.0–0.1)
Basophils Relative: 0.7 % (ref 0.0–3.0)
Eosinophils Absolute: 0.3 10*3/uL (ref 0.0–0.7)
Eosinophils Relative: 3.3 % (ref 0.0–5.0)
HCT: 36.4 % (ref 36.0–46.0)
Hemoglobin: 12.1 g/dL (ref 12.0–15.0)
Lymphocytes Relative: 16.3 % (ref 12.0–46.0)
Lymphs Abs: 1.3 10*3/uL (ref 0.7–4.0)
MCHC: 33.1 g/dL (ref 30.0–36.0)
MCV: 92.4 fl (ref 78.0–100.0)
Monocytes Absolute: 0.8 10*3/uL (ref 0.1–1.0)
Monocytes Relative: 10 % (ref 3.0–12.0)
Neutro Abs: 5.4 10*3/uL (ref 1.4–7.7)
Neutrophils Relative %: 69.7 % (ref 43.0–77.0)
Platelets: 299 10*3/uL (ref 150.0–400.0)
RBC: 3.94 Mil/uL (ref 3.87–5.11)
RDW: 15 % (ref 11.5–15.5)
WBC: 7.7 10*3/uL (ref 4.0–10.5)

## 2023-05-24 LAB — COMPREHENSIVE METABOLIC PANEL
ALT: 16 U/L (ref 0–35)
AST: 16 U/L (ref 0–37)
Albumin: 4 g/dL (ref 3.5–5.2)
Alkaline Phosphatase: 81 U/L (ref 39–117)
BUN: 21 mg/dL (ref 6–23)
CO2: 32 mEq/L (ref 19–32)
Calcium: 8.5 mg/dL (ref 8.4–10.5)
Chloride: 94 mEq/L — ABNORMAL LOW (ref 96–112)
Creatinine, Ser: 0.64 mg/dL (ref 0.40–1.20)
GFR: 84.43 mL/min (ref >60.00)
Glucose, Bld: 95 mg/dL (ref 70–99)
Potassium: 4.1 mEq/L (ref 3.5–5.1)
Sodium: 134 mEq/L — ABNORMAL LOW (ref 135–145)
Total Bilirubin: 0.3 mg/dL (ref 0.2–1.2)
Total Protein: 6.4 g/dL (ref 6.0–8.3)

## 2023-05-24 LAB — LIPID PANEL
Cholesterol: 127 mg/dL (ref 0–200)
HDL: 59 mg/dL (ref >39.00)
LDL Cholesterol: 32 mg/dL (ref 0–99)
NonHDL: 68.37
Total CHOL/HDL Ratio: 2
Triglycerides: 184 mg/dL — ABNORMAL HIGH (ref 0.0–149.0)
VLDL: 36.8 mg/dL (ref 0.0–40.0)

## 2023-05-24 LAB — TSH: TSH: 3.05 u[IU]/mL (ref 0.35–5.50)

## 2023-05-24 LAB — IBC + FERRITIN
Ferritin: 20.7 ng/mL (ref 10.0–291.0)
Iron: 53 ug/dL (ref 42–145)
Saturation Ratios: 14 % — ABNORMAL LOW (ref 20.0–50.0)
TIBC: 379.4 ug/dL (ref 250.0–450.0)
Transferrin: 271 mg/dL (ref 212.0–360.0)

## 2023-05-24 LAB — VITAMIN B12: Vitamin B-12: 297 pg/mL (ref 211–911)

## 2023-05-24 LAB — HEMOGLOBIN A1C: Hgb A1c MFr Bld: 5.2 % (ref 4.6–6.5)

## 2023-05-24 LAB — VITAMIN D 25 HYDROXY (VIT D DEFICIENCY, FRACTURES): VITD: 80.86 ng/mL (ref 30.00–100.00)

## 2023-05-24 NOTE — Patient Instructions (Signed)
Hypertension, Adult High blood pressure (hypertension) is when the force of blood pumping through the arteries is too strong. The arteries are the blood vessels that carry blood from the heart throughout the body. Hypertension forces the heart to work harder to pump blood and may cause arteries to become narrow or stiff. Untreated or uncontrolled hypertension can lead to a heart attack, heart failure, a stroke, kidney disease, and other problems. A blood pressure reading consists of a higher number over a lower number. Ideally, your blood pressure should be below 120/80. The first ("top") number is called the systolic pressure. It is a measure of the pressure in your arteries as your heart beats. The second ("bottom") number is called the diastolic pressure. It is a measure of the pressure in your arteries as the heart relaxes. What are the causes? The exact cause of this condition is not known. There are some conditions that result in high blood pressure. What increases the risk? Certain factors may make you more likely to develop high blood pressure. Some of these risk factors are under your control, including: Smoking. Not getting enough exercise or physical activity. Being overweight. Having too much fat, sugar, calories, or salt (sodium) in your diet. Drinking too much alcohol. Other risk factors include: Having a personal history of heart disease, diabetes, high cholesterol, or kidney disease. Stress. Having a family history of high blood pressure and high cholesterol. Having obstructive sleep apnea. Age. The risk increases with age. What are the signs or symptoms? High blood pressure may not cause symptoms. Very high blood pressure (hypertensive crisis) may cause: Headache. Fast or irregular heartbeats (palpitations). Shortness of breath. Nosebleed. Nausea and vomiting. Vision changes. Severe chest pain, dizziness, and seizures. How is this diagnosed? This condition is diagnosed by  measuring your blood pressure while you are seated, with your arm resting on a flat surface, your legs uncrossed, and your feet flat on the floor. The cuff of the blood pressure monitor will be placed directly against the skin of your upper arm at the level of your heart. Blood pressure should be measured at least twice using the same arm. Certain conditions can cause a difference in blood pressure between your right and left arms. If you have a high blood pressure reading during one visit or you have normal blood pressure with other risk factors, you may be asked to: Return on a different day to have your blood pressure checked again. Monitor your blood pressure at home for 1 week or longer. If you are diagnosed with hypertension, you may have other blood or imaging tests to help your health care provider understand your overall risk for other conditions. How is this treated? This condition is treated by making healthy lifestyle changes, such as eating healthy foods, exercising more, and reducing your alcohol intake. You may be referred for counseling on a healthy diet and physical activity. Your health care provider may prescribe medicine if lifestyle changes are not enough to get your blood pressure under control and if: Your systolic blood pressure is above 130. Your diastolic blood pressure is above 80. Your personal target blood pressure may vary depending on your medical conditions, your age, and other factors. Follow these instructions at home: Eating and drinking  Eat a diet that is high in fiber and potassium, and low in sodium, added sugar, and fat. An example of this eating plan is called the DASH diet. DASH stands for Dietary Approaches to Stop Hypertension. To eat this way: Eat   plenty of fresh fruits and vegetables. Try to fill one half of your plate at each meal with fruits and vegetables. Eat whole grains, such as whole-wheat pasta, brown rice, or whole-grain bread. Fill about one  fourth of your plate with whole grains. Eat or drink low-fat dairy products, such as skim milk or low-fat yogurt. Avoid fatty cuts of meat, processed or cured meats, and poultry with skin. Fill about one fourth of your plate with lean proteins, such as fish, chicken without skin, beans, eggs, or tofu. Avoid pre-made and processed foods. These tend to be higher in sodium, added sugar, and fat. Reduce your daily sodium intake. Many people with hypertension should eat less than 1,500 mg of sodium a day. Do not drink alcohol if: Your health care provider tells you not to drink. You are pregnant, may be pregnant, or are planning to become pregnant. If you drink alcohol: Limit how much you have to: 0-1 drink a day for women. 0-2 drinks a day for men. Know how much alcohol is in your drink. In the [place] one drink equals one 12 oz bottle of beer (355 mL), one 5 oz glass of wine (148 mL), or one 1 oz glass of hard liquor (44 mL). Lifestyle  Work with your health care provider to maintain a healthy body weight or to lose weight. Ask what an ideal weight is for you. Get at least 30 minutes of exercise that causes your heart to beat faster (aerobic exercise) most days of the week. Activities may include walking, swimming, or biking. Include exercise to strengthen your muscles (resistance exercise), such as Pilates or lifting weights, as part of your weekly exercise routine. Try to do these types of exercises for 30 minutes at least 3 days a week. Do not use any products that contain nicotine or tobacco. These products include cigarettes, chewing tobacco, and vaping devices, such as e-cigarettes. If you need help quitting, ask your health care provider. Monitor your blood pressure at home as told by your health care provider. Keep all follow-up visits. This is important. Medicines Take over-the-counter and prescription medicines only as told by your health care provider. Follow directions carefully. Blood  pressure medicines must be taken as prescribed. Do not skip doses of blood pressure medicine. Doing this puts you at risk for problems and can make the medicine less effective. Ask your health care provider about side effects or reactions to medicines that you should watch for. Contact a health care provider if you: Think you are having a reaction to a medicine you are taking. Have headaches that keep coming back (recurring). Feel dizzy. Have swelling in your ankles. Have trouble with your vision. Get help right away if you: Develop a severe headache or confusion. Have unusual weakness or numbness. Feel faint. Have severe pain in your chest or abdomen. Vomit repeatedly. Have trouble breathing. These symptoms may be an emergency. Get help right away. Call 911. Do not wait to see if the symptoms will go away. Do not drive yourself to the hospital. Summary Hypertension is when the force of blood pumping through your arteries is too strong. If this condition is not controlled, it may put you at risk for serious complications. Your personal target blood pressure may vary depending on your medical conditions, your age, and other factors. For most people, a normal blood pressure is less than 120/80. Hypertension is treated with lifestyle changes, medicines, or a combination of both. Lifestyle changes include losing weight, eating a healthy,   low-sodium diet, exercising more, and limiting alcohol. This information is not intended to replace advice given to you by your health care provider. Make sure you discuss any questions you have with your health care provider. Document Revised: 10/06/2021 Document Reviewed: 10/06/2021 Elsevier Patient Education  2024 Elsevier Inc.  

## 2023-05-24 NOTE — Progress Notes (Signed)
Subjective:   By signing my name below, I, Vickey Sages, attest that this documentation has been prepared under the direction and in the presence of Bradd Canary, MD., 05/24/2023.   Patient ID: Jackie Stevens, female    DOB: 1946-07-29, 77 y.o.   MRN: [id number]  No chief complaint on file.  HPI Patient is in today for an office visit and is accompanied by her son. She denies recent hospitalization, febrile illness, CP/palpitations/SOB/HA/fever/chills.  Anxiety and Depression Patient attends classes at [employer] and reports that she often experiences anxiety which is causing her to pick at the skin on her face. Her son has been applying hydrogen peroxide and neosporin as needed to heal the wounds. Furthermore, she reports that she has been experiencing episodes of vertigo while at these classes. These episodes have been frequent, occurring almost at every class, but have not occurred in the past 2 weeks.  Urinary Frequency and Incontinence Patient reports that she is awakening multiple times at night and getting up almost every hour during the day to urinate. She denies dysuria or hematuria. She further reports that her bowel movements have been regular, though she sometimes notices redness in the stool. She denies constipation, diarrhea, nausea, vomiting, or abdominal pain. She currently takes Naproxen 375 mg twice daily and Tylenol once daily for pain management.  Supplements Patient continues taking iron supplements and multivitamins daily.  Past Medical History:  Diagnosis Date   Abnormal cervical cytology 10/25/2012   Follows with Dr Maggie Font of Gyn   Allergic rhinitis 11/04/2010   Qualifier: Diagnosis of  By: Maple Hudson MD, Clinton D    Anemia 10/06/2013   Anginal pain    pt has history of CP states had cardiac workup with no specific issues identified    Arthritis    knees; right thumb; shoulders   Arthritis of left shoulder region 09/30/2016    X-ray of the left shoulder on 09/14/2016: Marked degenerative change with significant osteophytic spurring and subchondral sclerosis.  Loss of glenohumeral joint space.  Well-maintained subacromial space.  Injected 09/30/2016 Katrinka Blazing .   Asthma, mild intermittent 04/02/2013   Chicken pox as a child   Colonic diverticular abscess 11/21/2012   Constipation    Decreased hearing    Dermatitis 03/06/2017   Diverticulitis 10/25/2012   pt. reports that a drain was placed - 09/2012     Diverticulitis of rectosigmoid 11/28/2012   Dry mouth    Essential hypertension 11/08/2010   Qualifier: Diagnosis of  By: Maple Hudson MD, Clinton D    Excessive thirst    External hemorrhoid, bleeding    "sometimes" (2012-12-21)   Frequent urination    GERD (gastroesophageal reflux disease)    H/O hiatal hernia    Hand tingling    Heart murmur    History of kidney stones    Hyperglycemia 01/17/2020   Hyperlipidemia    Hypothyroidism    Incontinence    Insomnia    Kidney stones 1970's   "passed on their own" (2012/12/21)   Low back pain 06/09/2014   Measles as a child   Mild neurocognitive disorder 02/05/2020   Obesity 09/11/2017   Obstructive sleep apnea 11/04/2010   NPSG [facility] 07/15/10- AHI 13.5/ hr CPAP 10/APS     Otitis media 01/07/2014   Palpitations    Paresthesia 03/23/2015   Left face   PONV (postoperative nausea and vomiting)    Rheumatoid arteritis    Shortness of breath dyspnea    using  stairs   Sinus pain    Swelling of both lower extremities    Thyroid cancer 1980's   Tinnitus    UTI (urinary tract infection) 04/02/2013   Vertigo 08/05/2019   Vitamin D deficiency 02/28/2018    Past Surgical History:  Procedure Laterality Date   CHOLECYSTECTOMY  1990   COLON SURGERY     COLOSTOMY REVISION  12/19/2012   Procedure: COLON RESECTION SIGMOID;  Surgeon: Cherylynn Ridges, MD;  Location: [facility] OR;  Service: General;  Laterality: N/A;   CYSTOSCOPY WITH STENT PLACEMENT  12/19/2012   Procedure: CYSTOSCOPY WITH STENT  PLACEMENT;  Surgeon: Lindaann Slough, MD;  Location: [facility] OR;  Service: Urology;  Laterality: N/A;   DILATION AND CURETTAGE OF UTERUS  1960's   "lots of them; had miscarriages" (12/19/2012)   LYSIS OF ADHESION N/A 12/02/2015   Procedure: LAPAROSCOPIC LYSIS OF ADHESION;  Surgeon: Luretha Murphy, MD;  Location: [facility];  Service: General;  Laterality: N/A;   ROBOTIC ASSISTED BILATERAL SALPINGO OOPHERECTOMY Bilateral 12/02/2015   Procedure: XI ROBOTIC ASSISTED BILATERAL SALPINGO OOPHORECTOMY;  Surgeon: Adolphus Birchwood, MD;  Location: [facility];  Service: Gynecology;  Laterality: Bilateral;   SIGMOID RESECTION / RECTOPEXY  12/19/2012   THYROIDECTOMY, PARTIAL  1988   "then did iodine to remove the rest" (12/19/2012)   TONSILLECTOMY  1951?   TOTAL KNEE ARTHROPLASTY Left 01/19/2021   Procedure: TOTAL KNEE ARTHROPLASTY;  Surgeon: Ollen Gross, MD;  Location: [facility];  Service: Orthopedics;  Laterality: Left;    TRANSRECTAL DRAINAGE OF PELVIC ABSCESS  10/27/2012   VAGINAL HYSTERECTOMY  1970's   "still have my ovaries" (12/19/2012)    Family History  Problem Relation Age of Onset   Heart disease Father    Pneumonia Father    Hypertension Father    Hyperlipidemia Father    Cancer Father        skin   Stroke Father    Alzheimer's disease Mother    Heart disease Mother    Depression Mother    Emphysema Brother        marijuana and cigarettes   Alcohol abuse Brother    Hearing loss Brother    Diabetes Maternal Grandmother    Alzheimer's disease Paternal Grandmother    Cancer Paternal Grandmother        lung?- smoker   Hyperlipidemia Paternal Grandmother    Heart attack Paternal Grandfather    Alcohol abuse Paternal Grandfather    Neurofibromatosis Son        schwanomatosis   Neurofibromatosis Son        swanomatosis   Cancer Paternal Aunt     Social History   Socioeconomic History   Marital status: Widowed    Spouse name: Tasya Jobin   Number of children: 2   Years of education: 12    Highest education level: High school graduate  Occupational History   Occupation: Retired  Tobacco Use   Smoking status: Never    Passive exposure: Never   Smokeless tobacco: Never   Tobacco comments:    Verified by Alain Honey  Vaping Use   Vaping Use: Never used  Substance and Sexual Activity   Alcohol use: No    Alcohol/week: 0.0 standard drinks of alcohol   Drug use: No   Sexual activity: Not Currently  Other Topics Concern   Not on file  Social History Narrative   Married - (husband passed )   Children   Right handed   Some college  Retired   3 sons   Social Determinants of Corporate investment banker Strain: Low Risk  (01/05/2023)   Overall Financial Resource Strain (CARDIA)    Difficulty of Paying Living Expenses: Not hard at all  Food Insecurity: No Food Insecurity (01/05/2023)   Hunger Vital Sign    Worried About Running Out of Food in the Last Year: Never true    Ran Out of Food in the Last Year: Never true  Transportation Needs: No Transportation Needs (01/05/2023)   PRAPARE - Administrator, Civil Service (Medical): No    Lack of Transportation (Non-Medical): No  Physical Activity: Insufficiently Active (01/05/2023)   Exercise Vital Sign    Days of Exercise per Week: 5 days    Minutes of Exercise per Session: 20 min  Stress: No Stress Concern Present (01/05/2023)   [employer] of Occupational Health - Occupational Stress Questionnaire    Feeling of Stress : Not at all  Social Connections: Moderately Isolated (01/05/2023)   Social Connection and Isolation Panel [NHANES]    Frequency of Communication with Friends and Family: More than three times a week    Frequency of Social Gatherings with Friends and Family: More than three times a week    Attends Religious Services: Never    Database administrator or Organizations: Yes    Attends Banker Meetings: 1 to 4 times per year    Marital Status: Widowed  Intimate Partner  Violence: Not At Risk (01/05/2023)   Humiliation, Afraid, Rape, and Kick questionnaire    Fear of Current or Ex-Partner: No    Emotionally Abused: No    Physically Abused: No    Sexually Abused: No    Outpatient Medications Prior to Visit  Medication Sig Dispense Refill   albuterol (VENTOLIN HFA) 108 (90 Base) MCG/ACT inhaler TAKE 2 PUFFS BY MOUTH EVERY 6 HOURS AS NEEDED FOR WHEEZE OR SHORTNESS OF BREATH 8.5 each 2   amLODipine (NORVASC) 2.5 MG tablet Take 1 tablet (2.5 mg total) by mouth daily. 90 tablet 1   Azelastine HCl 137 MCG/SPRAY SOLN PLACE 2 SPRAYS INTO BOTH NOSTRILS 2 (TWO) TIMES DAILY. 90 mL 4   benzonatate (TESSALON PERLES) 100 MG capsule Take 1 capsule (100 mg total) by mouth 3 (three) times daily as needed for cough. 30 capsule 2   Black Pepper-Turmeric (TURMERIC CURCUMIN) 04-999 MG CAPS 1 tablet daily.     carboxymethylcellulose (REFRESH PLUS) 0.5 % SOLN Place 1 drop into both eyes 3 (three) times daily as needed (dry eyes).     Cholecalciferol-Vitamin C (VITAMIN D3-VITAMIN C) 1000-500 UNIT-MG CAPS daily.     docusate sodium (COLACE) 100 MG capsule Take 100 mg by mouth daily.     donepezil (ARICEPT) 10 MG tablet TAKE 1 TABLET BY MOUTH EVERY DAY 90 tablet 0   famotidine (PEPCID) 20 MG tablet Take 2 tablets (40 mg total) by mouth daily. 180 tablet 1   FLUoxetine (PROZAC) 10 MG tablet Take 1 tablet (10 mg total) by mouth daily. 90 tablet 1   gabapentin (NEURONTIN) 300 MG capsule TAKE 1 CAPSULE BY MOUTH EVERYDAY AT BEDTIME 30 capsule 1   hydrochlorothiazide (HYDRODIURIL) 25 MG tablet TAKE 1 TABLET (25 MG TOTAL) BY MOUTH DAILY. 90 tablet 1   levothyroxine (SYNTHROID) 125 MCG tablet Take 1 tablet (125 mcg total) by mouth daily before breakfast. 90 tablet 1   losartan (COZAAR) 100 MG tablet TAKE 1 TABLET BY MOUTH EVERYDAY AT BEDTIME 90 tablet 1  memantine (NAMENDA) 5 MG tablet TAKE 1 TABLET BY MOUTH EVERYDAY AT BEDTIME 90 tablet 1   metoprolol tartrate (LOPRESSOR) 100 MG tablet Take  1.5 tablets (150 mg total) by mouth 2 (two) times daily. 270 tablet 1   naproxen (NAPROSYN) 375 MG tablet TAKE 1 TABLET (375 MG TOTAL) BY MOUTH 2 (TWO) TIMES DAILY AS NEEDED FOR MODERATE PAIN OR HEADACHE. 60 tablet 3   nitroGLYCERIN (NITROSTAT) 0.4 MG SL tablet PLACE 1 TABLET UNDER THE TONGUE EVERY 5 MINUTES AS NEEDED FOR CHEST PAIN 25 tablet 1   omeprazole (PRILOSEC) 40 MG capsule TAKE 1 CAPSULE BY MOUTH TWICE A DAY 180 capsule 1   ondansetron (ZOFRAN) 4 MG tablet Take 1 tablet (4 mg total) by mouth every 8 (eight) hours as needed for nausea or vomiting. 30 tablet 1   polyethylene glycol (MIRALAX / GLYCOLAX) 17 g packet Take 17 g by mouth daily.     Psyllium-Calcium (METAMUCIL PLUS CALCIUM) CAPS daily.     rosuvastatin (CRESTOR) 40 MG tablet TAKE 1 TABLET BY MOUTH EVERY DAY 90 tablet 1   temazepam (RESTORIL) 7.5 MG capsule 1 po every other day for 1 week, then every third day for 1 week. 7 capsule 0   No facility-administered medications prior to visit.    Allergies  Allergen Reactions   Neomycin-Bacitracin Zn-Polymyx Rash    Polysporin- is tolerated    Niacin Other (See Comments) and Cough    "cough til I threw up" (12/19/2012)   Ciprofloxacin Hives    Got cipro and flagyl at same time, localized hives to IV arm   Flagyl [Metronidazole] Hives    Got cipro and flagyl at same time, localized hives to IV arm   Neomycin Other (See Comments)    Review of Systems  Constitutional:  Negative for chills and fever.  Respiratory:  Negative for shortness of breath.   Cardiovascular:  Negative for chest pain and palpitations.  Gastrointestinal:  Negative for abdominal pain, constipation, diarrhea, nausea and vomiting.  Genitourinary:  Positive for frequency and urgency. Negative for dysuria and hematuria.  Skin:           Neurological:  Negative for headaches.  Psychiatric/Behavioral:  The patient is nervous/anxious.        Objective:    Physical Exam Constitutional:      General: She  is not in acute distress.    Appearance: Normal appearance. She is not ill-appearing.  HENT:     Head: Normocephalic and atraumatic.     Right Ear: External ear normal.     Left Ear: External ear normal.     Nose: Nose normal.     Mouth/Throat:     Mouth: Mucous membranes are moist.     Pharynx: Oropharynx is clear.  Eyes:     General:        Right eye: No discharge.        Left eye: No discharge.     Extraocular Movements: Extraocular movements intact.     Conjunctiva/sclera: Conjunctivae normal.     Pupils: Pupils are equal, round, and reactive to light.  Cardiovascular:     Rate and Rhythm: Normal rate and regular rhythm.     Pulses: Normal pulses.     Heart sounds: Normal heart sounds. No murmur heard.    No gallop.  Pulmonary:     Effort: Pulmonary effort is normal. No respiratory distress.     Breath sounds: Normal breath sounds. No wheezing or rales.  Abdominal:  General: Bowel sounds are normal.     Palpations: Abdomen is soft.     Tenderness: There is no abdominal tenderness. There is no guarding.  Musculoskeletal:        General: Normal range of motion.     Cervical back: Normal range of motion.     Right lower leg: No edema.     Left lower leg: No edema.  Skin:    General: Skin is warm and dry.  Neurological:     Mental Status: She is alert and oriented to person, place, and time.  Psychiatric:        Mood and Affect: Mood normal.        Behavior: Behavior normal.        Judgment: Judgment normal.     There were no vitals taken for this visit. Wt Readings from Last 3 Encounters:  03/22/23 177 lb 9.6 oz (80.6 kg)  02/01/23 176 lb 12.8 oz (80.2 kg)  01/20/23 171 lb (77.6 kg)    Diabetic Foot Exam - Simple   No data filed    Lab Results  Component Value Date   WBC 6.5 04/26/2023   HGB 11.6 (L) 04/26/2023   HCT 34.3 (L) 04/26/2023   PLT 289.0 04/26/2023   GLUCOSE 124 (H) 02/01/2023   CHOL 133 02/01/2023   TRIG 143.0 02/01/2023   HDL 54.30  02/01/2023   LDLDIRECT 81.0 02/12/2020   LDLCALC 51 02/01/2023   ALT 18 02/01/2023   AST 17 02/01/2023   NA 132 (L) 02/01/2023   K 4.0 02/01/2023   CL 93 (L) 02/01/2023   CREATININE 0.90 02/01/2023   BUN 19 02/01/2023   CO2 27 02/01/2023   TSH 3.71 02/01/2023   INR 1.0 01/12/2021   HGBA1C 5.8 10/12/2022    Lab Results  Component Value Date   TSH 3.71 02/01/2023   Lab Results  Component Value Date   WBC 6.5 04/26/2023   HGB 11.6 (L) 04/26/2023   HCT 34.3 (L) 04/26/2023   MCV 89.1 04/26/2023   PLT 289.0 04/26/2023   Lab Results  Component Value Date   NA 132 (L) 02/01/2023   K 4.0 02/01/2023   CO2 27 02/01/2023   GLUCOSE 124 (H) 02/01/2023   BUN 19 02/01/2023   CREATININE 0.90 02/01/2023   BILITOT 0.3 02/01/2023   ALKPHOS 71 02/01/2023   AST 17 02/01/2023   ALT 18 02/01/2023   PROT 6.2 02/01/2023   ALBUMIN 4.1 02/01/2023   CALCIUM 8.9 02/01/2023   ANIONGAP 8 01/20/2021   GFR 61.24 02/01/2023   Lab Results  Component Value Date   CHOL 133 02/01/2023   Lab Results  Component Value Date   HDL 54.30 02/01/2023   Lab Results  Component Value Date   LDLCALC 51 02/01/2023   Lab Results  Component Value Date   TRIG 143.0 02/01/2023   Lab Results  Component Value Date   CHOLHDL 2 02/01/2023   Lab Results  Component Value Date   HGBA1C 5.8 10/12/2022      Assessment & Plan:  Dementia with Anxiety: This is managed with Donepezil 10 mg and Memantine 5 mg daily. There were no medication adjustments today.  Essential Hypertension: This is controlled with Amlodipine 2.5 mg and Losartan 100 mg daily. There were no medication adjustments today.  Hyperlipidemia: This is controlled with Rosuvastatin 40 mg daily. There were no medication adjustments today.  Labs: Routine blood work ordered.  Vertigo: Encouraged 6-8 hours of sleep, heart healthy diet,  60-80 oz of non-alcohol/non-caffeinated fluids, and 4000-8000 steps daily. Problem List Items Addressed This  Visit       Cardiovascular and Mediastinum   Essential hypertension     Endocrine   Hypothyroidism     Nervous and Auditory   Dementia with anxiety (HCC)     Other   Hyperlipidemia, mixed - Primary   Vitamin B12 deficiency   Vitamin D deficiency   No orders of the defined types were placed in this encounter.  I, Vickey Sages, personally preformed the services described in this documentation.  All medical record entries made by the scribe were at my direction and in my presence.  I have reviewed the chart and discharge instructions (if applicable) and agree that the record reflects my personal performance and is accurate and complete. 05/24/2023  I,Mohammed Iqbal,acting as a scribe for Danise Edge, MD.,have documented all relevant documentation on the behalf of Danise Edge, MD,as directed by  Danise Edge, MD while in the presence of Danise Edge, MD.  Vickey Sages

## 2023-05-25 NOTE — Assessment & Plan Note (Signed)
Describes a couple episodes at first at vertigo but really a woozey feeling with some nausea. They have occurred at her day treatment program. She often does not eat prior to atttending. Reminded to eat protein and hydrate well every four hours and if episodes recur let us know

## 2023-05-25 NOTE — Assessment & Plan Note (Signed)
Increase leafy greens, consider increased lean red meat and using cast iron cookware. Continue to monitor, report any concerns 

## 2023-05-25 NOTE — Assessment & Plan Note (Signed)
hgba1c acceptable, minimize simple carbs. Increase exercise as tolerated. Continue current meds 

## 2023-05-26 ENCOUNTER — Other Ambulatory Visit: Payer: Self-pay | Admitting: Family Medicine

## 2023-06-05 ENCOUNTER — Other Ambulatory Visit: Payer: Self-pay | Admitting: Orthopaedic Surgery

## 2023-06-05 ENCOUNTER — Other Ambulatory Visit: Payer: Self-pay | Admitting: Family Medicine

## 2023-06-10 ENCOUNTER — Other Ambulatory Visit: Payer: Self-pay | Admitting: Family Medicine

## 2023-06-14 ENCOUNTER — Ambulatory Visit: Payer: Medicare HMO | Admitting: Physician Assistant

## 2023-06-14 ENCOUNTER — Encounter: Payer: Self-pay | Admitting: Physician Assistant

## 2023-06-14 VITALS — BP 110/60 | HR 65 | Resp 20 | Ht 62.0 in | Wt 184.0 lb

## 2023-06-14 DIAGNOSIS — G309 Alzheimer's disease, unspecified: Secondary | ICD-10-CM | POA: Diagnosis not present

## 2023-06-14 DIAGNOSIS — F0280 Dementia in other diseases classified elsewhere without behavioral disturbance: Secondary | ICD-10-CM

## 2023-06-14 IMAGING — MR MR LUMBAR SPINE W/O CM
4 of 5 series · 18 of 48 positions shown · non-contrast
Comparison: Lumbar radiography 10/28/2021

CLINICAL DATA: Lumbar radiculopathy. Symptoms persist for over 6
weeks. Right sciatica.

EXAM:
MRI LUMBAR SPINE WITHOUT CONTRAST
TECHNIQUE: Multiplanar, multisequence MR imaging of the lumbar spine was
performed. No intravenous contrast was administered.

[Series 5: T2 · sagittal · 4.0mm · 0.73mm/px · 6 of 15 slices shown (1 of 2)]
[im 1/15]
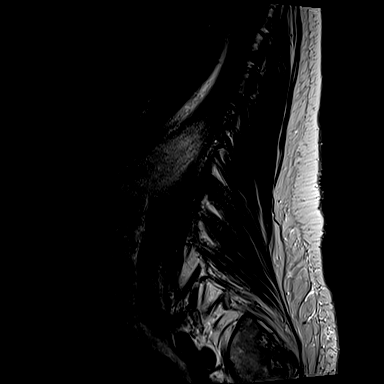
[im 3/15]
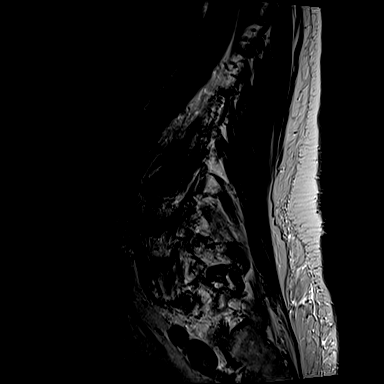
[im 6/15]
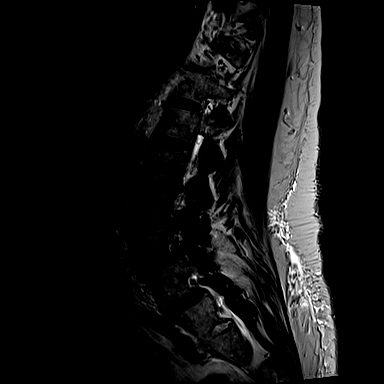
[im 9/15]
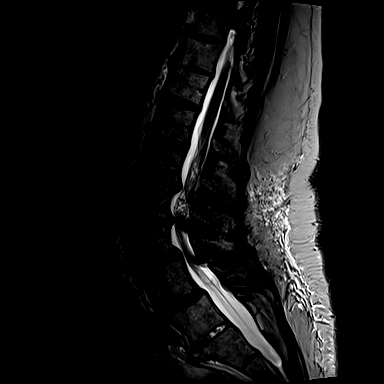
[im 12/15]
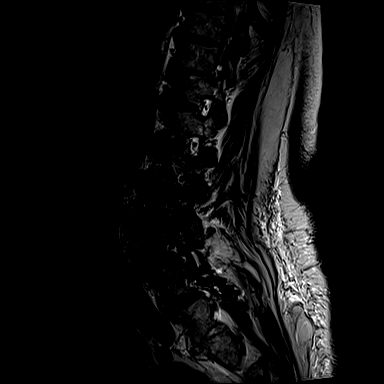
[im 15/15]
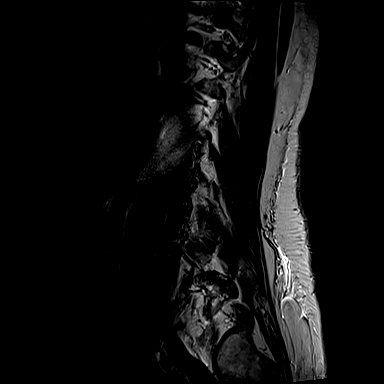

[Series 6: T1 · sagittal · 4.0mm · 0.73mm/px · 3 of 15 slices shown (1 of 2)]
[im 3/15]
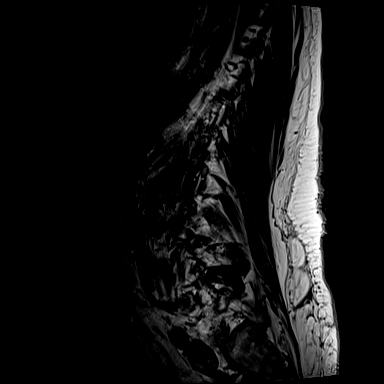
[im 9/15]
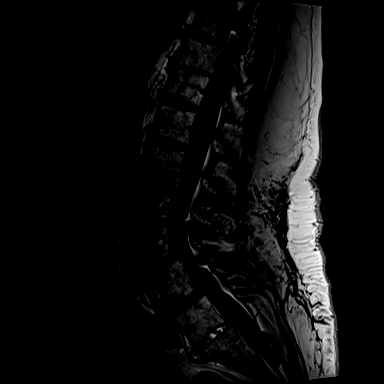
[im 15/15]
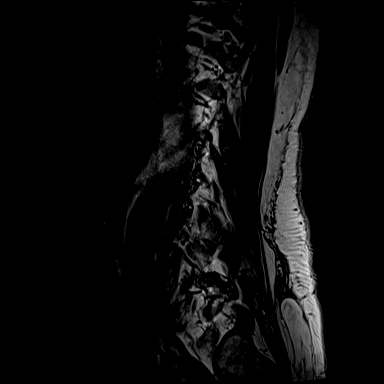

[Series 10: T2 · axial · 4.0mm · 0.28mm/px · z∈[-84,+107]mm · 6 of 40 slices shown (2 of 2)]
[im 1/40]
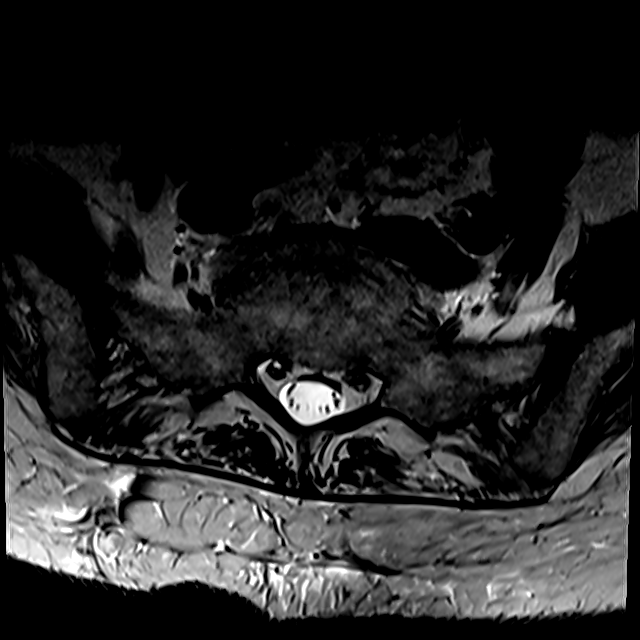
[im 6/40]
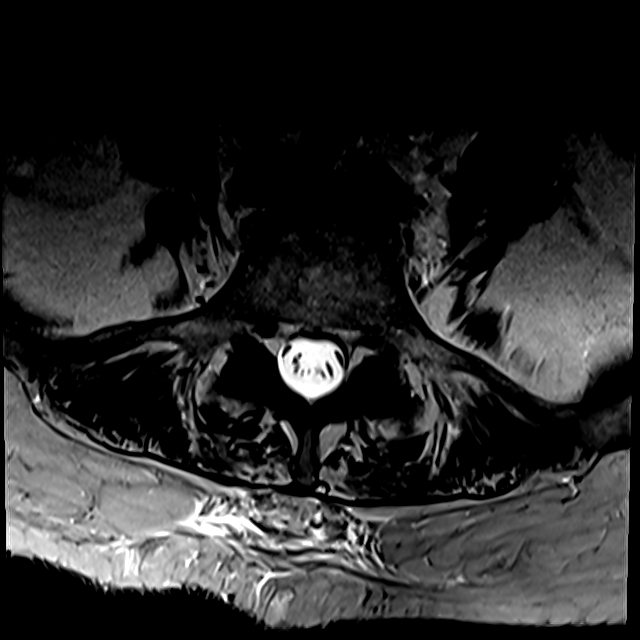
[im 12/40]
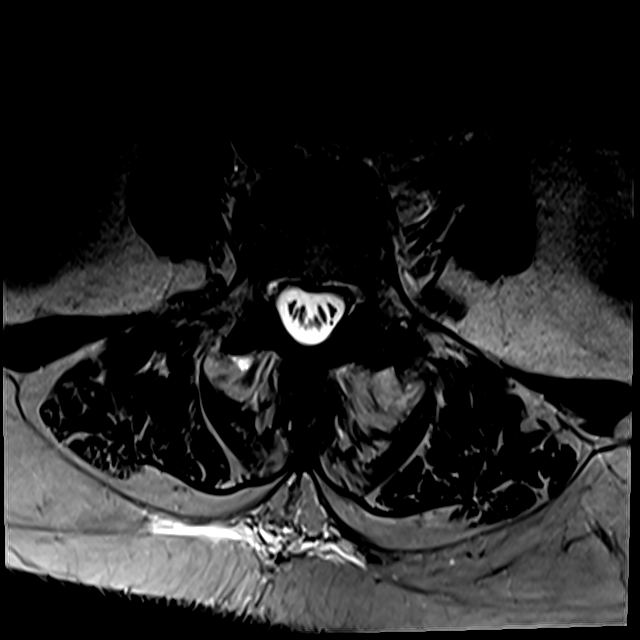
[im 17/40]
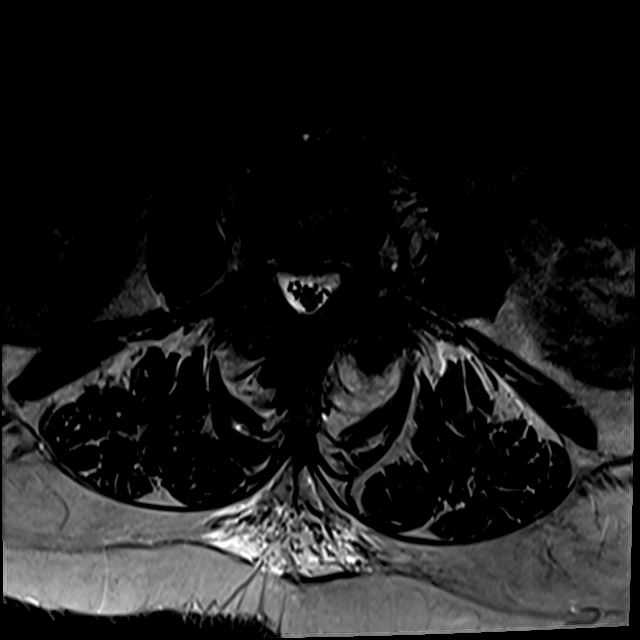
[im 20/40]
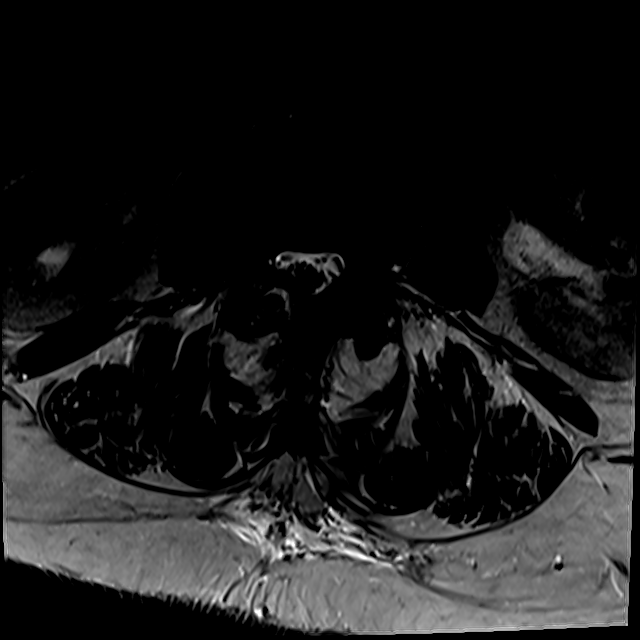
[im 34/40]
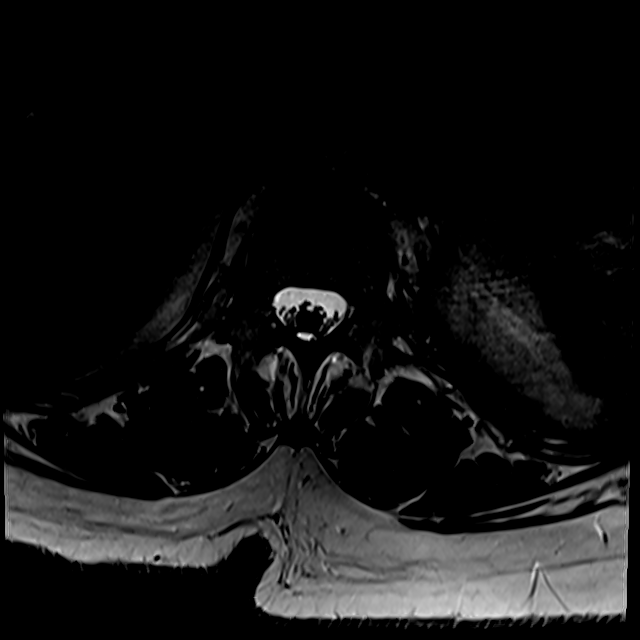

[Series 100: T1 · axial · 4.0mm · 0.28mm/px · z∈[-60,+107]mm · 3 of 40 slices shown (2 of 2)]
[im 6/40]
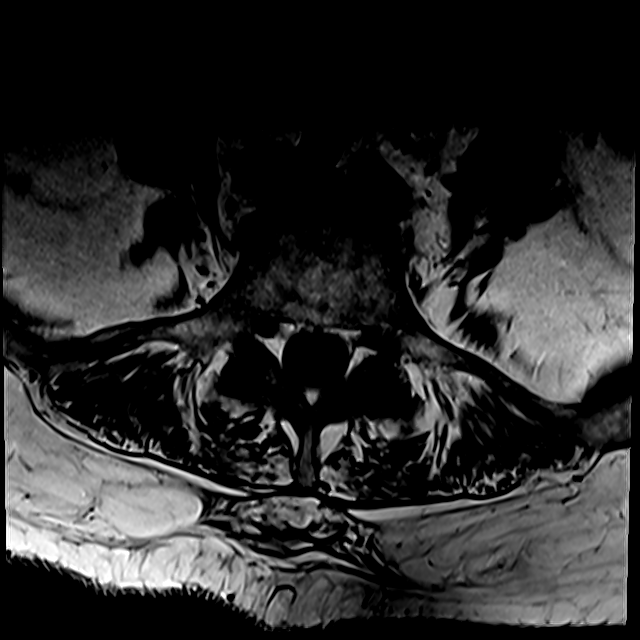
[im 20/40]
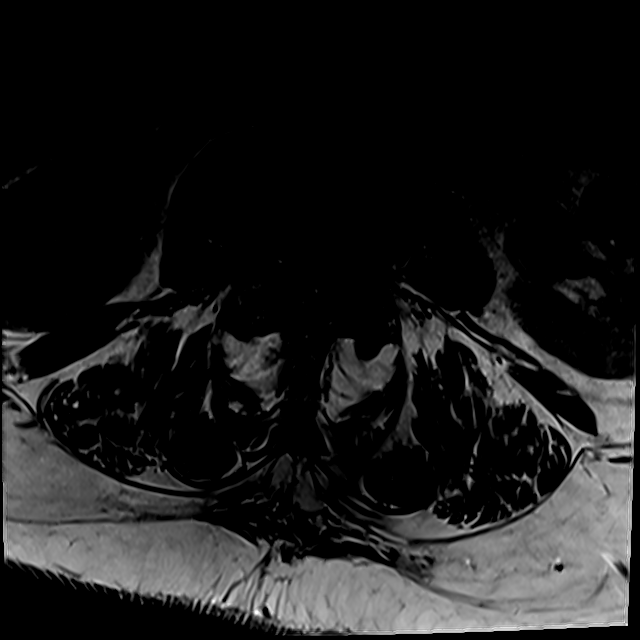
[im 34/40]
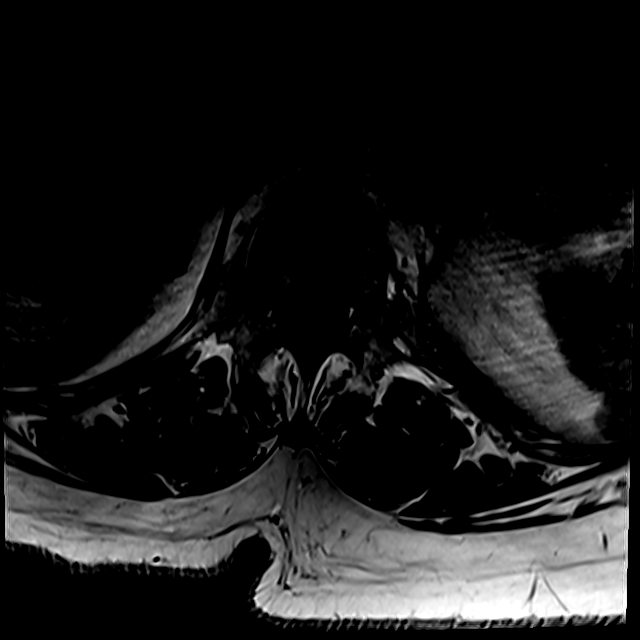

[18 of 48 positions shown; findings below may reference images not displayed]

FINDINGS: Segmentation: Based on the lowest visible ribs there is L5-S1
fusion.

Alignment: Degenerative anterolisthesis at L3-4 and L4-5, facet
mediated

Vertebrae:  No fracture, evidence of discitis, or bone lesion.

Conus medullaris and cauda equina: Conus extends to the L1 level.
Conus appears normal. Cauda equina redundancy due to the degree of
severe spinal stenosis at L2-3.

Paraspinal and other soft tissues: No evidence of perispinal mass or
inflammation.

Disc levels:

T12- L1: Unremarkable.

L1-L2: Disc collapse and endplate degeneration with disc bulging and
ridging. Degenerative facet spurring on both sides. Mild spinal and
bilateral foraminal stenosis

L2-L3: Disc collapse and endplate degeneration with disc bulging and
endplate spurring. Facet osteoarthritis on both sides. Advanced
spinal and bilateral foraminal stenosis.

L3-L4: Advanced facet osteoarthritis with spurring and
anterolisthesis. Disc space narrowing and bulging with central
protrusion. Advanced spinal stenosis. High-grade bilateral foraminal
narrowing

L4-L5: Advanced facet osteoarthritis with spurring and
anterolisthesis. Disc narrowing and bulging. Mild right foraminal
narrowing.

L5-S1:Intervertebral ankylosis.  No impingement
IMPRESSION: 1. Fusion at the level numbered L5-S1.
2. Generalized degeneration with hyperlordosis and L3-4, L4-5 facet
mediated anterolisthesis.
3. Advanced spinal stenosis at L3-4 and especially L2-3. Bilateral
foraminal stenosis at the same levels.

## 2023-06-14 MED ORDER — MEMANTINE HCL 10 MG PO TABS: 10.0000 mg | ORAL_TABLET | Freq: Two times a day (BID) | ORAL | 11 refills | Status: DC

## 2023-06-14 MED ORDER — DONEPEZIL HCL 10 MG PO TABS: ORAL_TABLET | ORAL | 3 refills | Status: DC

## 2023-06-14 NOTE — Progress Notes (Signed)
Assessment/Plan:   Memory Impairment  EMBERLIN HOWER is a very pleasant 78 y.o. RH female with a history of hypertension, hyperlipidemia, thyroid cancer s/p partial thyroidectomy, arthritis with chronic pain, chronic anemia of iron deficiency, B12 deficiency, vitamin D deficiency, hyperglycemia, seen today in follow up for memory loss. Patient is currently on donepezil 10 mg daily and memantine 5 mg twice daily.  Her MMSE today is 17/30, with slight cognitive decline in naming objects. She still able to participate in ADLs around the house.  She no longer drives.    Follow up in 6  months. Continue donepezil 10 mg daily. Side effects were discussed  Increase Memantine to 10mg  twice daily. Side effects were discussed  Recommend good control of her cardiovascular risk factors Continue to control mood as per PCP Continue adult day program activities.    Subjective:    This patient is accompanied in the office by her son who supplements the history.  Previous records as well as any outside records available were reviewed prior to todays visit. Patient was last seen on 12/07/2022 with an MMSE of 16/30    Any changes in memory since last visit? "May be a tiny worse"  She may forget recent conversations and names of people.  She goes to [employer] 4 times a week from 10 to 2 PM, and she enjoys it very much.  He makes ornaments, watches TV, listens to music, plays games. [place] other [place]. repeats oneself?  Endorsed, repeating the same sentence several times or asking the same question "maybe a little more than before". Disoriented when walking into a room?  Sometimes she may be confused about where she leaves "this is [place] or [place]?  "On the way here she was asking if we are in [place] says Leaving objects in unusual places?  May misplace things such as dishes and sometime she may misplace the glasses or the housephone.   Wandering behavior?  She is  afraid of walking outside.  Family making plans, or assistance around the house to make sure that she is safe and did not fall. Any personality changes since last visit?  denies   Any worsening depression?:  denies   Hallucinations or paranoia?  Denies hallucinations, but she is paranoid about somebody breaking into the house, although having curtains down helps according to family. Seizures? denies    Any sleep changes?  She sleeps well.  Occasionally she will walk to the bathroom during the night, but then goes back to sleep.  Denies vivid dreams, REM behavior or sleepwalking   Sleep apnea?  Endorsed, uses CPAP. Any hygiene concerns? denies  Independent of bathing and dressing?  Endorsed  Does the patient needs help with medications?  Son is in charge   Who is in charge of the finances?  Son is in charge     Any changes in appetite?  She does not forget if she ate or not, but she may not remember what she ate.       Patient have trouble swallowing? denies   Does the patient cook? No Any headaches?   denies   Chronic pain?  She has chronic left shoulder and hip pain.  She is due to arthritis. Ambulates with difficulty?  She uses a walker for stability and safety.  Recent falls or head injuries? denies     Unilateral weakness, numbness or tingling? denies   Any tremors?  denies  Any anosmia?  Patient denies   Any incontinence of urine?  Endorsed. Does not wear diapers Any bowel dysfunction?   denies      Patient lives with son Does the patient drive? No longer drives     History on Initial Assessment 11/26/2019: This is a pleasant 77 year old right-handed woman with a history of hypertension, hyperlipidemia, thyroid cancer s/p partial thyroidectomy, presenting for evaluation of memory loss. She does not think her memory is good, there are certain things she cannot remember. She looks to her husband for answers several times during the visit. She lives with her husband and son. She manages  medications without difficulties and denies getting lost driving. She became concerned about her memory because she had always used to do their checkbook but in February, something was not right. She could not figure it out, and stopped doing it since then. She states she is not even sure she can do it now. Her husband does not think there is anything significantly concerning about her memory. He reports the checkbook issue "threw her off." She gets flustered when she does not remember things. He has not noticed any personality changes but they both note that she is more paranoid, closing the blinds at night. They have lived in the same house for 37 years but for the past 6 months, she feels like someone is always looking in their house and will shoot at them, "I don't know why." She and her husband deny any hallucinations. She has always been worried about things, and is anxious today. She states mood is "I don't know," she is afraid to do some things but thinks mood is alright. She notes some depression due to inability to travel like before. Her mother had Alzheimer's disease. No history of significant head injuries. She rarely drinks alcohol.    She denies any headaches, diplopia, dysarthria/dysphagia, neck/back pain, focal numbness/tingling/weakness, bowel/bladder dysfunction, tremors. She had a lot of dizziness for a time where she would feel lightheaded and have to hold on. I personally reviewed head CT without contrast done in August 2020 for dizziness which did not show any acute changes, there was moderate diffuse atrophy. She had an infection prior to the start of Covid-19 where she lost her sense of taste and smell, it has not come back. She usually gets 7 hours of sleep with her CPAP machine, waking up at 4am. She is occasionally drowsy in the day.  PREVIOUS MEDICATIONS:   CURRENT MEDICATIONS:  Outpatient Encounter Medications as of 06/14/2023  Medication Sig   albuterol (VENTOLIN HFA) 108 (90  Base) MCG/ACT inhaler TAKE 2 PUFFS BY MOUTH EVERY 6 HOURS AS NEEDED FOR WHEEZE OR SHORTNESS OF BREATH   amLODipine (NORVASC) 2.5 MG tablet Take 1 tablet (2.5 mg total) by mouth daily.   Azelastine HCl 137 MCG/SPRAY SOLN PLACE 2 SPRAYS INTO BOTH NOSTRILS 2 (TWO) TIMES DAILY.   benzonatate (TESSALON PERLES) 100 MG capsule Take 1 capsule (100 mg total) by mouth 3 (three) times daily as needed for cough.   Black Pepper-Turmeric (TURMERIC CURCUMIN) 04-999 MG CAPS 1 tablet daily.   carboxymethylcellulose (REFRESH PLUS) 0.5 % SOLN Place 1 drop into both eyes 3 (three) times daily as needed (dry eyes).   Cholecalciferol-Vitamin C (VITAMIN D3-VITAMIN C) 1000-500 UNIT-MG CAPS daily.   docusate sodium (COLACE) 100 MG capsule Take 100 mg by mouth daily.   donepezil (ARICEPT) 10 MG tablet TAKE 1 TABLET BY MOUTH EVERY DAY   famotidine (PEPCID) 20 MG  tablet Take 2 tablets (40 mg total) by mouth daily.   FLUoxetine (PROZAC) 10 MG tablet Take 1 tablet (10 mg total) by mouth daily.   gabapentin (NEURONTIN) 300 MG capsule TAKE 1 CAPSULE BY MOUTH EVERYDAY AT BEDTIME   hydrochlorothiazide (HYDRODIURIL) 25 MG tablet TAKE 1 TABLET (25 MG TOTAL) BY MOUTH DAILY.   levothyroxine (SYNTHROID) 125 MCG tablet Take 1 tablet (125 mcg total) by mouth daily before breakfast.   losartan (COZAAR) 100 MG tablet TAKE 1 TABLET BY MOUTH EVERYDAY AT BEDTIME   memantine (NAMENDA) 5 MG tablet TAKE 1 TABLET BY MOUTH EVERYDAY AT BEDTIME   metoprolol tartrate (LOPRESSOR) 100 MG tablet Take 1.5 tablets (150 mg total) by mouth 2 (two) times daily.   naproxen (NAPROSYN) 375 MG tablet TAKE 1 TABLET BY MOUTH TWICE A DAY AS NEEDED FOR MODERATE PAIN OR HEADACHE   nitroGLYCERIN (NITROSTAT) 0.4 MG SL tablet PLACE 1 TABLET UNDER THE TONGUE EVERY 5 MINUTES AS NEEDED FOR CHEST PAIN   omeprazole (PRILOSEC) 40 MG capsule TAKE 1 CAPSULE BY MOUTH TWICE A DAY   ondansetron (ZOFRAN) 4 MG tablet Take 1 tablet (4 mg total) by mouth every 8 (eight) hours as  needed for nausea or vomiting.   polyethylene glycol (MIRALAX / GLYCOLAX) 17 g packet Take 17 g by mouth daily.   Psyllium-Calcium (METAMUCIL PLUS CALCIUM) CAPS daily.   rosuvastatin (CRESTOR) 40 MG tablet TAKE 1 TABLET BY MOUTH EVERY DAY   temazepam (RESTORIL) 7.5 MG capsule 1 po every other day for 1 week, then every third day for 1 week.   No facility-administered encounter medications on file as of 06/14/2023.       12/07/2022    9:00 AM 05/03/2022    3:00 PM 05/25/2018    3:25 PM  MMSE - Mini Mental State Exam  Orientation to time 0 0 5  Orientation to Place 4 0 5  Registration 1 3 3   Attention/ Calculation 3 2 5   Recall 2 0 2  Language- name 2 objects 1 2 2   Language- repeat 1 1 1   Language- follow 3 step command 2 3 3   Language- read & follow direction 1 1 1   Write a sentence 1 0 1  Copy design 0 0 1  Total score 16 12 29        No data to display          Objective:     PHYSICAL EXAMINATION:    VITALS:  There were no vitals filed for this visit.  GEN:  The patient appears stated age and is in NAD. HEENT:  Normocephalic, atraumatic.   Neurological examination:  General: NAD, well-groomed, appears stated age. Orientation: The patient is alert. Oriented to person, not to place or date Cranial nerves: There is good facial symmetry.The speech is fluent and clear. No aphasia or dysarthria. Fund of knowledge is reduced. Recent and remote memory are impaired. Attention and concentration are reduced.  Able to name objects 1/2 and repeat phrases.  Hearing is intact to conversational tone. Delayed recall 0/3   Sensation: Sensation is intact to light touch throughout Motor: Strength is at least antigravity x4. DTR's 2/4 in UE/LE     Movement examination: Tone: There is normal tone in the UE/LE Abnormal movements:  no tremor.  No myoclonus.  No asterixis.   Coordination:  There is no decremation with RAM's. Normal finger to nose  Gait and Station: The patient has no  difficulty arising out of a deep-seated chair without  the use of the hands. The patient's stride length is good.  Gait is cautious and narrow.    Thank you for allowing [place] the opportunity to participate in the care of this nice patient. Please do not hesitate to contact us for any questions or concerns.   Total time spent on today's visit was 27 minutes dedicated to this patient today, preparing to see patient, examining the patient, ordering tests and/or medications and counseling the patient, documenting clinical information in the EHR or other health record, independently interpreting results and communicating results to the patient/family, discussing treatment and goals, answering patient's questions and coordinating care.  Cc:  Bradd Canary, MD  Marlowe Kays 06/14/2023 6:32 AM   12/08/2019

## 2023-06-14 NOTE — Patient Instructions (Signed)
    Follow up in  6 months. Continue donepezil 10 mg daily. Side effects were discussed  Continue Memantine 10 mg twice daily. Side effects were discussed  Continue the adult day program

## 2023-06-18 ENCOUNTER — Other Ambulatory Visit: Payer: Self-pay | Admitting: Family Medicine

## 2023-06-29 ENCOUNTER — Other Ambulatory Visit: Payer: Self-pay | Admitting: Family Medicine

## 2023-06-29 DIAGNOSIS — K219 Gastro-esophageal reflux disease without esophagitis: Secondary | ICD-10-CM

## 2023-06-29 DIAGNOSIS — R059 Cough, unspecified: Secondary | ICD-10-CM

## 2023-06-29 DIAGNOSIS — J302 Other seasonal allergic rhinitis: Secondary | ICD-10-CM

## 2023-07-04 DIAGNOSIS — G4733 Obstructive sleep apnea (adult) (pediatric): Secondary | ICD-10-CM | POA: Diagnosis not present

## 2023-07-07 ENCOUNTER — Other Ambulatory Visit: Payer: Self-pay | Admitting: Physician Assistant

## 2023-07-10 ENCOUNTER — Other Ambulatory Visit: Payer: Self-pay | Admitting: Physician Assistant

## 2023-07-12 ENCOUNTER — Other Ambulatory Visit (INDEPENDENT_AMBULATORY_CARE_PROVIDER_SITE_OTHER): Payer: Medicare HMO

## 2023-07-12 ENCOUNTER — Encounter: Payer: Self-pay | Admitting: Gastroenterology

## 2023-07-12 ENCOUNTER — Ambulatory Visit: Payer: Medicare HMO | Admitting: Gastroenterology

## 2023-07-12 VITALS — BP 130/74 | HR 60 | Ht 64.0 in | Wt 187.0 lb

## 2023-07-12 DIAGNOSIS — D509 Iron deficiency anemia, unspecified: Secondary | ICD-10-CM

## 2023-07-12 DIAGNOSIS — R195 Other fecal abnormalities: Secondary | ICD-10-CM

## 2023-07-12 LAB — COMPREHENSIVE METABOLIC PANEL
ALT: 20 U/L (ref 0–35)
AST: 18 U/L (ref 0–37)
Albumin: 4.3 g/dL (ref 3.5–5.2)
Alkaline Phosphatase: 81 U/L (ref 39–117)
BUN: 15 mg/dL (ref 6–23)
CO2: 32 mEq/L (ref 19–32)
Calcium: 9.1 mg/dL (ref 8.4–10.5)
Chloride: 91 mEq/L — ABNORMAL LOW (ref 96–112)
Creatinine, Ser: 0.65 mg/dL (ref 0.40–1.20)
GFR: 84.04 mL/min (ref >60.00)
Glucose, Bld: 104 mg/dL — ABNORMAL HIGH (ref 70–99)
Potassium: 4.1 mEq/L (ref 3.5–5.1)
Sodium: 131 mEq/L — ABNORMAL LOW (ref 135–145)
Total Bilirubin: 0.3 mg/dL (ref 0.2–1.2)
Total Protein: 6.8 g/dL (ref 6.0–8.3)

## 2023-07-12 LAB — CBC WITH DIFFERENTIAL/PLATELET
Basophils Absolute: 0.1 10*3/uL (ref 0.0–0.1)
Basophils Relative: 0.6 % (ref 0.0–3.0)
Eosinophils Absolute: 0.4 10*3/uL (ref 0.0–0.7)
Eosinophils Relative: 4.1 % (ref 0.0–5.0)
HCT: 39.4 % (ref 36.0–46.0)
Hemoglobin: 13 g/dL (ref 12.0–15.0)
Lymphocytes Relative: 18 % (ref 12.0–46.0)
Lymphs Abs: 1.8 10*3/uL (ref 0.7–4.0)
MCHC: 32.9 g/dL (ref 30.0–36.0)
MCV: 92.1 fl (ref 78.0–100.0)
Monocytes Absolute: 1.1 10*3/uL — ABNORMAL HIGH (ref 0.1–1.0)
Monocytes Relative: 10.9 % (ref 3.0–12.0)
Neutro Abs: 6.5 10*3/uL (ref 1.4–7.7)
Neutrophils Relative %: 66.4 % (ref 43.0–77.0)
Platelets: 297 10*3/uL (ref 150.0–400.0)
RBC: 4.28 Mil/uL (ref 3.87–5.11)
RDW: 13.7 % (ref 11.5–15.5)
WBC: 9.8 10*3/uL (ref 4.0–10.5)

## 2023-07-12 LAB — IBC + FERRITIN
Ferritin: 27.3 ng/mL (ref 10.0–291.0)
Iron: 36 ug/dL — ABNORMAL LOW (ref 42–145)
Saturation Ratios: 9.4 % — ABNORMAL LOW (ref 20.0–50.0)
TIBC: 385 ug/dL (ref 250.0–450.0)
Transferrin: 275 mg/dL (ref 212.0–360.0)

## 2023-07-12 NOTE — Progress Notes (Signed)
Attending Physician's Attestation   I have reviewed the chart.   I agree with the Advanced Practitioner's note, impression, and recommendations with any updates as below. Can monitor and followup due to comorbidities at the time.  However, if she continues to show anemia in future, then agree endoscopic evaluation is the next steps for her and will be considered.     Corliss Parish, MD [facility] Office # [phone]

## 2023-07-12 NOTE — Patient Instructions (Addendum)
  Please follow up in 6 months with [place]   Your provider has requested that you go to the basement level for lab work before leaving today. Press "B" on the elevator. The lab is located at the first door on the left as you exit the elevator.   _______________________________________________________  If your blood pressure at your visit was 140/90 or greater, please contact your primary care physician to follow up on this.  _______________________________________________________  If you are age 56 or older, your body mass index should be between 23-30. Your Body mass index is 32.1 kg/m. If this is out of the aforementioned range listed, please consider follow up with your Primary Care Provider.  If you are age 13 or younger, your body mass index should be between 19-25. Your Body mass index is 32.1 kg/m. If this is out of the aformentioned range listed, please consider follow up with your Primary Care Provider.   ________________________________________________________  The [facility] providers would like to encourage you to use [facility] to communicate with providers for non-urgent requests or questions.  Due to long hold times on the telephone, sending your provider a message by [facility] may be a faster and more efficient way to get a response.  Please allow 48 business hours for a response.  Please remember that this is for non-urgent requests.  _______________________________________________________ It was a pleasure to see you today!  Thank you for trusting me with your gastrointestinal care!

## 2023-07-12 NOTE — Progress Notes (Signed)
Chief Complaint: Anemia, heme positive stool Primary GI MD: Gentry Fitz  HPI: 77 year old female history of hypertension, OSA, asthma, GERD, dementia, presents for evaluation of chronic anemia  Patient is s/p sigmoid colectomy with repair of colovesical fistula in 2014 done by Dr. Lindie Spruce due to history of diverticulitis of rectosigmoid with abscess.  Patient then had a colonoscopy with Dr. 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  Patient presents today with her son to discuss anemia.  Hemoccult positive 03/2023.  It was also positive 7 years ago. Upon review of previous CBCs appears patient has had borderline anemia with Hgb as low as 11.6 drawn 3 months ago which then improved to 12.1 on 1 tablet oral iron daily.  Appears in 2022 she also had anemia with Hgb of 10.5  Most recent lab work 05/24/2023 Hgb 12.1, MCV 92.4 BUN 21, creatinine 0.64 Iron 53, saturation 14%, TIBC 379.4 Ferritin 20.7, improved from 11 2 months ago) vitamin B12 297  Patient denies any GI symptoms.  Denies abdominal pain, nausea, vomiting.  Denies weight loss.  Denies rectal bleeding.  States after starting her iron supplement she began having dark stools.  Stools are formed and dark.   PREVIOUS GI WORKUP   Colonoscopy 12/2013 done for screening with Dr. 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  Loss of glenohumeral joint space.  Well-maintained subacromial space.  Injected 09/30/2016 Katrinka Blazing .   Asthma, mild intermittent 04/02/2013   Chicken pox as a child   Colonic diverticular abscess 11/21/2012   Constipation    Decreased hearing    Dermatitis 03/06/2017   Diverticulitis 10/25/2012   pt. reports that a drain was placed - 09/2012     Diverticulitis of rectosigmoid 11/28/2012   Dry mouth    Essential hypertension 11/08/2010   Qualifier: Diagnosis of  By: Maple Hudson MD, Clinton D    Excessive thirst    External hemorrhoid, bleeding    "sometimes" (01/10/13)   Frequent urination    GERD (gastroesophageal reflux disease)    H/O hiatal hernia    Hand tingling    Heart murmur    History of kidney stones    Hyperglycemia 01/17/2020   Hyperlipidemia    Hypothyroidism    Incontinence    Insomnia    Kidney stones 1970's   "passed on their own" (01/10/2013)   Low back pain 06/09/2014   Measles as a child   Mild neurocognitive disorder 02/05/2020   Obesity 09/11/2017   Obstructive sleep apnea 11/04/2010   NPSG [facility] 07/15/10- AHI 13.5/ hr CPAP 10/APS     Otitis media 01/07/2014   Palpitations    Paresthesia 03/23/2015   Left face   PONV (postoperative nausea and vomiting)    Rheumatoid arteritis    Shortness of  breath dyspnea    using stairs   Sinus pain    Swelling of both lower extremities    Thyroid cancer 1980's   Tinnitus    UTI (urinary tract infection) 04/02/2013   Vertigo 08/05/2019   Vitamin D deficiency 02/28/2018    Past Surgical History:  Procedure Laterality Date   CHOLECYSTECTOMY  1990   COLON SURGERY     COLOSTOMY REVISION  12/19/2012   Procedure: COLON RESECTION SIGMOID;  Surgeon: Cherylynn Ridges, MD;  Location: [facility] OR;  Service: General;  Laterality: N/A;   CYSTOSCOPY WITH STENT PLACEMENT  12/19/2012   Procedure: CYSTOSCOPY WITH STENT PLACEMENT;  Surgeon: Lindaann Slough, MD;   Location: [facility] OR;  Service: Urology;  Laterality: N/A;   DILATION AND CURETTAGE OF UTERUS  1960's   "lots of them; had miscarriages" (12/19/2012)   LYSIS OF ADHESION N/A 12/02/2015   Procedure: LAPAROSCOPIC LYSIS OF ADHESION;  Surgeon: Luretha Murphy, MD;  Location: [facility];  Service: General;  Laterality: N/A;   ROBOTIC ASSISTED BILATERAL SALPINGO OOPHERECTOMY Bilateral 12/02/2015   Procedure: XI ROBOTIC ASSISTED BILATERAL SALPINGO OOPHORECTOMY;  Surgeon: Adolphus Birchwood, MD;  Location: [facility];  Service: Gynecology;  Laterality: Bilateral;   SIGMOID RESECTION / RECTOPEXY  12/19/2012   THYROIDECTOMY, PARTIAL  1988   "then did iodine to remove the rest" (12/19/2012)   TONSILLECTOMY  1951?   TOTAL KNEE ARTHROPLASTY Left 01/19/2021   Procedure: TOTAL KNEE ARTHROPLASTY;  Surgeon: Ollen Gross, MD;  Location: [facility];  Service: Orthopedics;  Laterality: Left;    TRANSRECTAL DRAINAGE OF PELVIC ABSCESS  10/27/2012   VAGINAL HYSTERECTOMY  1970's   "still have my ovaries" (12/19/2012)    Current Outpatient Medications  Medication Sig Dispense Refill   albuterol (VENTOLIN HFA) 108 (90 Base) MCG/ACT inhaler TAKE 2 PUFFS BY MOUTH EVERY 6 HOURS AS NEEDED FOR WHEEZE OR SHORTNESS OF BREATH 18 each 2   amLODipine (NORVASC) 2.5 MG tablet Take 1 tablet (2.5 mg total) by mouth daily. 90 tablet 1   Azelastine HCl 137 MCG/SPRAY SOLN PLACE 2 SPRAYS INTO BOTH NOSTRILS 2 (TWO) TIMES DAILY. 90 mL 4   benzonatate (TESSALON PERLES) 100 MG capsule Take 1 capsule (100 mg total) by mouth 3 (three) times daily as needed for cough. 30 capsule 2   Black Pepper-Turmeric (TURMERIC CURCUMIN) 04-999 MG CAPS 1 tablet daily.     carboxymethylcellulose (REFRESH PLUS) 0.5 % SOLN Place 1 drop into both eyes 3 (three) times daily as needed (dry eyes).     Cholecalciferol-Vitamin C (VITAMIN D3-VITAMIN C) 1000-500 UNIT-MG CAPS daily.     docusate sodium (COLACE) 100 MG capsule Take 100 mg by mouth daily.     donepezil (ARICEPT) 10 MG tablet  TAKE 1 TABLET BY MOUTH EVERY DAY 90 tablet 3   famotidine (PEPCID) 20 MG tablet Take 2 tablets (40 mg total) by mouth daily. 180 tablet 1   FLUoxetine (PROZAC) 10 MG tablet Take 1 tablet (10 mg total) by mouth daily. 90 tablet 1   gabapentin (NEURONTIN) 300 MG capsule TAKE 1 CAPSULE BY MOUTH EVERYDAY AT BEDTIME 30 capsule 1   hydrochlorothiazide (HYDRODIURIL) 25 MG tablet TAKE 1 TABLET (25 MG TOTAL) BY MOUTH DAILY. 90 tablet 1   levothyroxine (SYNTHROID) 125 MCG tablet Take 1 tablet (125 mcg total) by mouth daily before breakfast. 90 tablet 1   losartan (COZAAR) 100 MG tablet TAKE 1 TABLET BY MOUTH EVERYDAY AT BEDTIME 90 tablet 1   memantine (NAMENDA) 10 MG tablet TAKE 1  TABLET BY MOUTH TWICE A DAY 180 tablet 2   metoprolol tartrate (LOPRESSOR) 100 MG tablet Take 1.5 tablets (150 mg total) by mouth 2 (two) times daily. 270 tablet 1   naproxen (NAPROSYN) 375 MG tablet TAKE 1 TABLET BY MOUTH TWICE A DAY AS NEEDED FOR MODERATE PAIN OR HEADACHE 60 tablet 3   nitroGLYCERIN (NITROSTAT) 0.4 MG SL tablet PLACE 1 TABLET UNDER THE TONGUE EVERY 5 MINUTES AS NEEDED FOR CHEST PAIN 25 tablet 1   omeprazole (PRILOSEC) 40 MG capsule TAKE 1 CAPSULE BY MOUTH TWICE A DAY 180 capsule 1   ondansetron (ZOFRAN) 4 MG tablet Take 1 tablet (4 mg total) by mouth every 8 (eight) hours as needed for nausea or vomiting. 30 tablet 1   polyethylene glycol (MIRALAX / GLYCOLAX) 17 g packet Take 17 g by mouth daily.     Psyllium-Calcium (METAMUCIL PLUS CALCIUM) CAPS daily.     rosuvastatin (CRESTOR) 40 MG tablet TAKE 1 TABLET BY MOUTH EVERY DAY 90 tablet 1   temazepam (RESTORIL) 7.5 MG capsule 1 po every other day for 1 week, then every third day for 1 week. 7 capsule 0   No current facility-administered medications for this visit.    Allergies as of 07/12/2023 - Review Complete 06/14/2023  Allergen Reaction Noted   Neomycin-bacitracin zn-polymyx Rash    Niacin Other (See Comments) and Cough    Ciprofloxacin Hives 09/14/2015    Flagyl [metronidazole] Hives 09/14/2015   Neomycin Other (See Comments) 12/07/2022    Family History  Problem Relation Age of Onset   Heart disease Father    Pneumonia Father    Hypertension Father    Hyperlipidemia Father    Cancer Father        skin   Stroke Father    Alzheimer's disease Mother    Heart disease Mother    Depression Mother    Emphysema Brother        marijuana and cigarettes   Alcohol abuse Brother    Hearing loss Brother    Diabetes Maternal Grandmother    Alzheimer's disease Paternal Grandmother    Cancer Paternal Grandmother        lung?- smoker   Hyperlipidemia Paternal Grandmother    Heart attack Paternal Grandfather    Alcohol abuse Paternal Grandfather    Neurofibromatosis Son        schwanomatosis   Neurofibromatosis Son        swanomatosis   Cancer Paternal Aunt     Social History   Socioeconomic History   Marital status: Widowed    Spouse name: Indi Verran   Number of children: 2   Years of education: 12   Highest education level: High school graduate  Occupational History   Occupation: Retired  Tobacco Use   Smoking status: Never    Passive exposure: Never   Smokeless tobacco: Never   Tobacco comments:    Verified by Alain Honey  Vaping Use   Vaping status: Never Used  Substance and Sexual Activity   Alcohol use: No    Alcohol/week: 0.0 standard drinks of alcohol   Drug use: No   Sexual activity: Not Currently  Other Topics Concern   Not on file  Social History Narrative   Married - (husband passed )   Children   Right handed   Some college   Retired   3 sons   Social Determinants of Health   Financial Resource Strain: Low Risk  (01/05/2023)   Overall Financial  Resource Strain (CARDIA)    Difficulty of Paying Living Expenses: Not hard at all  Food Insecurity: No Food Insecurity (01/05/2023)   Hunger Vital Sign    Worried About Running Out of Food in the Last Year: Never true    Ran Out of Food in the Last  Year: Never true  Transportation Needs: No Transportation Needs (01/05/2023)   PRAPARE - Administrator, Civil Service (Medical): No    Lack of Transportation (Non-Medical): No  Physical Activity: Insufficiently Active (01/05/2023)   Exercise Vital Sign    Days of Exercise per Week: 5 days    Minutes of Exercise per Session: 20 min  Stress: No Stress Concern Present (01/05/2023)   [employer] of Occupational Health - Occupational Stress Questionnaire    Feeling of Stress : Not at all  Social Connections: Moderately Isolated (01/05/2023)   Social Connection and Isolation Panel [NHANES]    Frequency of Communication with Friends and Family: More than three times a week    Frequency of Social Gatherings with Friends and Family: More than three times a week    Attends Religious Services: Never    Database administrator or Organizations: Yes    Attends Banker Meetings: 1 to 4 times per year    Marital Status: Widowed  Intimate Partner Violence: Not At Risk (01/05/2023)   Humiliation, Afraid, Rape, and Kick questionnaire    Fear of Current or Ex-Partner: No    Emotionally Abused: No    Physically Abused: No    Sexually Abused: No    Review of Systems:    Constitutional: No weight loss, fever, chills, weakness or fatigue HEENT: Eyes: No change in vision               Ears, Nose, Throat:  No change in hearing or congestion Skin: No rash or itching Cardiovascular: No chest pain, chest pressure or palpitations   Respiratory: No SOB or cough Gastrointestinal: See HPI and otherwise negative Genitourinary: No dysuria or change in urinary frequency Neurological: No headache, dizziness or syncope Musculoskeletal: No new muscle or joint pain Hematologic: No bleeding or bruising Psychiatric: No history of depression or anxiety    Physical Exam:  Vital signs: There were no vitals taken for this visit.  Constitutional: NAD, elderly female walks with a walker   head:  Normocephalic and atraumatic. Eyes:   PEERL, EOMI. No icterus. Conjunctiva pink. Respiratory: Respirations even and unlabored.  Cardiovascular:  No peripheral edema, cyanosis or pallor.  Gastrointestinal:  Soft, nondistended, nontender. No rebound or guarding.  No appreciable masses or hepatomegaly. Rectal:  Not performed.  Msk:  Symmetrical without gross deformities. Without edema, no deformity or joint abnormality.  Neurologic:  Alert and  oriented x4;  grossly normal neurologically.  Skin:   Dry and intact without significant lesions or rashes. Psychiatric: Oriented to person, place and time. Demonstrates good judgement and reason without abnormal affect or behaviors.   RELEVANT LABS AND IMAGING: CBC    Component Value Date/Time   WBC 7.7 05/24/2023 1206   RBC 3.94 05/24/2023 1206   HGB 12.1 05/24/2023 1206   HCT 36.4 05/24/2023 1206   PLT 299.0 05/24/2023 1206   MCV 92.4 05/24/2023 1206   MCH 29.2 01/20/2021 0308   MCHC 33.1 05/24/2023 1206   RDW 15.0 05/24/2023 1206   LYMPHSABS 1.3 05/24/2023 1206   MONOABS 0.8 05/24/2023 1206   EOSABS 0.3 05/24/2023 1206   BASOSABS 0.1 05/24/2023 1206  CMP     Component Value Date/Time   NA 134 (L) 05/24/2023 1206   NA 137 07/04/2018 1035   K 4.1 05/24/2023 1206   CL 94 (L) 05/24/2023 1206   CO2 32 05/24/2023 1206   GLUCOSE 95 05/24/2023 1206   BUN 21 05/24/2023 1206   BUN 18 07/04/2018 1035   CREATININE 0.64 05/24/2023 1206   CREATININE 0.63 10/16/2020 1527   CALCIUM 8.5 05/24/2023 1206   PROT 6.4 05/24/2023 1206   PROT 6.3 07/04/2018 1035   ALBUMIN 4.0 05/24/2023 1206   ALBUMIN 4.2 07/04/2018 1035   AST 16 05/24/2023 1206   ALT 16 05/24/2023 1206   ALKPHOS 81 05/24/2023 1206   BILITOT 0.3 05/24/2023 1206   BILITOT 0.3 07/04/2018 1035   GFRNONAA >60 01/20/2021 0308   GFRAA >60 07/17/2019 1042     Assessment/Plan:   Heme positive stool Iron deficiency anemia, unspecified iron deficiency anemia  type Appears patient has intermittent chronic iron deficiency anemia with hemoglobin around 11-12.  Heme positive stool documented April 2024 and was positive 7 years ago.  Last colonoscopy in 2015 showed stricture.  No previous EGD.  Patient does have a history of dementia and son states it was incredibly difficult just to get a stool sample from her due to decreased mobility as she walks with a walker. Patient is overall oriented and participates in most ADLs and follows with neuro. Extensive discussion with son and patient about next steps including EGD/colonoscopy for further evaluation.  We discussed that colonoscopy would likely be difficult for patient with the prep due to her decreased mobility.  I did offer EGD for further evaluation as well as close monitoring.  Patient's son feels more comfortable with monitoring and would like to avoid procedures at this time.  -- Advised patient and son about red flag symptoms such as bleeding, change in bowel habits, weight loss.  If any of these present they are instructed to let us know. -- Recheck CBC, iron panel, CMP today -- If constipation develops while taking iron supplement would recommend MiraLAX 1-2 capfuls daily -- Follow-up in 6 months.  We can recheck lab work at this time and if still doing well can follow-up as needed.  Discussed if they change their mind we are happy to do EGD for further evaluation   [facility] 07/12/2023, 1:07 PM  Cc: Bradd Canary, MD

## 2023-07-20 ENCOUNTER — Other Ambulatory Visit: Payer: Self-pay | Admitting: Family Medicine

## 2023-07-21 ENCOUNTER — Other Ambulatory Visit: Payer: Self-pay | Admitting: *Deleted

## 2023-07-21 DIAGNOSIS — D509 Iron deficiency anemia, unspecified: Secondary | ICD-10-CM

## 2023-07-28 ENCOUNTER — Encounter (INDEPENDENT_AMBULATORY_CARE_PROVIDER_SITE_OTHER): Payer: Self-pay

## 2023-08-07 ENCOUNTER — Other Ambulatory Visit: Payer: Self-pay | Admitting: Orthopaedic Surgery

## 2023-08-14 ENCOUNTER — Other Ambulatory Visit: Payer: Self-pay | Admitting: Family Medicine

## 2023-08-14 DIAGNOSIS — E038 Other specified hypothyroidism: Secondary | ICD-10-CM

## 2023-08-16 ENCOUNTER — Other Ambulatory Visit: Payer: Self-pay | Admitting: Physician Assistant

## 2023-08-16 ENCOUNTER — Other Ambulatory Visit: Payer: Self-pay | Admitting: Family Medicine

## 2023-08-30 ENCOUNTER — Ambulatory Visit (INDEPENDENT_AMBULATORY_CARE_PROVIDER_SITE_OTHER): Payer: Medicare HMO | Admitting: Family Medicine

## 2023-08-30 VITALS — BP 130/74 | HR 65 | Temp 97.6°F | Resp 16 | Ht 64.0 in | Wt 192.4 lb

## 2023-08-30 DIAGNOSIS — E038 Other specified hypothyroidism: Secondary | ICD-10-CM

## 2023-08-30 DIAGNOSIS — Z23 Encounter for immunization: Secondary | ICD-10-CM | POA: Diagnosis not present

## 2023-08-30 DIAGNOSIS — E538 Deficiency of other specified B group vitamins: Secondary | ICD-10-CM

## 2023-08-30 DIAGNOSIS — E782 Mixed hyperlipidemia: Secondary | ICD-10-CM

## 2023-08-30 DIAGNOSIS — R739 Hyperglycemia, unspecified: Secondary | ICD-10-CM | POA: Diagnosis not present

## 2023-08-30 DIAGNOSIS — I10 Essential (primary) hypertension: Secondary | ICD-10-CM

## 2023-08-30 LAB — CBC WITH DIFFERENTIAL/PLATELET
Basophils Absolute: 0.1 10*3/uL (ref 0.0–0.1)
Basophils Relative: 0.6 % (ref 0.0–3.0)
Eosinophils Absolute: 0.2 10*3/uL (ref 0.0–0.7)
Eosinophils Relative: 1.9 % (ref 0.0–5.0)
HCT: 38.4 % (ref 36.0–46.0)
Hemoglobin: 12.8 g/dL (ref 12.0–15.0)
Lymphocytes Relative: 13.3 % (ref 12.0–46.0)
Lymphs Abs: 1.1 10*3/uL (ref 0.7–4.0)
MCHC: 33.2 g/dL (ref 30.0–36.0)
MCV: 93.6 fl (ref 78.0–100.0)
Monocytes Absolute: 0.7 10*3/uL (ref 0.1–1.0)
Monocytes Relative: 7.9 % (ref 3.0–12.0)
Neutro Abs: 6.3 10*3/uL (ref 1.4–7.7)
Neutrophils Relative %: 76.3 % (ref 43.0–77.0)
Platelets: 269 10*3/uL (ref 150.0–400.0)
RBC: 4.1 Mil/uL (ref 3.87–5.11)
RDW: 13.2 % (ref 11.5–15.5)
WBC: 8.3 10*3/uL (ref 4.0–10.5)

## 2023-08-30 LAB — LIPID PANEL
Cholesterol: 138 mg/dL (ref 0–200)
HDL: 66.4 mg/dL (ref >39.00)
LDL Cholesterol: 48 mg/dL (ref 0–99)
NonHDL: 71.43
Total CHOL/HDL Ratio: 2
Triglycerides: 119 mg/dL (ref 0.0–149.0)
VLDL: 23.8 mg/dL (ref 0.0–40.0)

## 2023-08-30 LAB — COMPREHENSIVE METABOLIC PANEL
ALT: 18 U/L (ref 0–35)
AST: 19 U/L (ref 0–37)
Albumin: 4 g/dL (ref 3.5–5.2)
Alkaline Phosphatase: 82 U/L (ref 39–117)
BUN: 16 mg/dL (ref 6–23)
CO2: 30 meq/L (ref 19–32)
Calcium: 8.5 mg/dL (ref 8.4–10.5)
Chloride: 96 meq/L (ref 96–112)
Creatinine, Ser: 0.69 mg/dL (ref 0.40–1.20)
GFR: 82.76 mL/min (ref >60.00)
Glucose, Bld: 91 mg/dL (ref 70–99)
Potassium: 4.4 meq/L (ref 3.5–5.1)
Sodium: 133 meq/L — ABNORMAL LOW (ref 135–145)
Total Bilirubin: 0.5 mg/dL (ref 0.2–1.2)
Total Protein: 6.2 g/dL (ref 6.0–8.3)

## 2023-08-30 LAB — HEMOGLOBIN A1C: Hgb A1c MFr Bld: 5.5 % (ref 4.6–6.5)

## 2023-08-30 LAB — TSH: TSH: 2.37 u[IU]/mL (ref 0.35–5.50)

## 2023-08-30 LAB — VITAMIN B12: Vitamin B-12: 405 pg/mL (ref 211–911)

## 2023-08-30 NOTE — Patient Instructions (Signed)
[employer] The Blue ZonesAnemia  Anemia is a condition in which there are not enough red blood cells or hemoglobin in the blood. Hemoglobin is a substance in red blood cells that carries oxygen. When you do not have enough red blood cells or hemoglobin (are anemic), your body cannot get enough oxygen, and your organs may not work properly. As a result, you may feel very tired or have other problems. What are the causes? Common causes of anemia include: Excessive bleeding. Anemia can be caused by excessive bleeding inside or outside the body, including bleeding from the intestines or from heavy menstrual periods in females. Poor nutrition. Long-lasting (chronic) kidney, thyroid, and liver disease. Bone marrow disorders, spleen problems, and blood disorders. Cancer and treatments for cancer. Human immunodeficiency virus (HIV) and acquired immunodeficiency syndrome (AIDS). Infections, medicines, and autoimmune disorders that destroy red blood cells. What are the signs or symptoms? Symptoms of this condition include: Minor weakness. Dizziness. Headache, or difficulties concentrating and sleeping. Heartbeats that feel irregular or faster than normal (palpitations). Shortness of breath, especially with exercise. Pale skin, lips, and nails, or cold hands and feet. Upset stomach (indigestion) and nausea. Symptoms may occur suddenly or develop slowly. If your anemia is mild, you may not have symptoms. How is this diagnosed? This condition is diagnosed based on blood tests, your medical history, and a physical exam. In some cases, a test may be needed in which cells are removed from the soft tissue inside of a bone and looked at under a microscope (bone marrow biopsy). Your health care provider may also check your stool (feces) for blood and may do more testing to look for the cause of your bleeding. Other tests may include: Imaging tests, such as a CT scan or MRI. A procedure to see inside your  esophagus and stomach (endoscopy). The esophagus is the part of the body that moves food from your mouth to your stomach. A procedure to see inside your colon and rectum (colonoscopy). How is this treated? Treatment for this condition depends on the cause. If you continue to lose a lot of blood, you may need to be treated at a hospital. Treatment may include: Taking supplements of iron, vitamin B12, or folic acid. Taking a hormone medicine (erythropoietin) that can help to stimulate red blood cell growth. Receiving donated blood through an IV (blood transfusion). This may be needed if you lose a lot of blood. Making changes to your diet. Having surgery to remove your spleen. Follow these instructions at home: Take over-the-counter and prescription medicines only as told by your health care provider. Take supplements only as told by your health care provider. Follow any diet instructions that you were given by your health care provider. Keep all follow-up visits. Your health care provider will want to recheck your blood tests. Contact a health care provider if: You develop new bleeding anywhere in the body. You are very weak. Get help right away if: You are short of breath. You have pain in your abdomen or chest. You are dizzy or feel faint. You have trouble concentrating. You have bloody stools, black stools, or tarry stools. You vomit repeatedly or you vomit up blood. These symptoms may be an emergency. Get help right away. Call 911. Do not wait to see if the symptoms will go away. Do not drive yourself to the hospital. Summary Anemia is a condition in which you do not have enough red blood cells or enough of a substance in your red blood cells  that carries oxygen. Symptoms may occur suddenly or develop slowly. If your anemia is mild, you may not have symptoms. This condition is diagnosed with blood tests, a medical history, and a physical exam. Other tests may be needed. Treatment  for this condition depends on the cause of the anemia. This information is not intended to replace advice given to you by your health care provider. Make sure you discuss any questions you have with your health care provider. Document Revised: 02/22/2022 Document Reviewed: 02/22/2022 Elsevier Patient Education  2024 [employer].

## 2023-08-31 NOTE — Progress Notes (Signed)
Subjective:    Patient ID: Jackie Stevens, female    DOB: May 07, 1946, 77 y.o.   MRN: [id number]  Chief Complaint  Patient presents with   Follow-up    Follow up    HPI Discussed the use of AI scribe software for clinical note transcription with the patient, who gave verbal consent to proceed.  History of Present Illness   The patient, with a history of iron deficiency anemia, low sodium levels, low normal B12 levels, and well-controlled diabetes, reports no new complaints. The patient is active, attending a wellness and daycare program four times a week. The patient's diet and bowel movements are normal. The patient's family member reports that the patient has been compliant with the iron supplements.        Past Medical History:  Diagnosis Date   Abnormal cervical cytology 10/25/2012   Follows with Dr Maggie Font of Gyn   Allergic rhinitis 11/04/2010   Qualifier: Diagnosis of  By: Maple Hudson MD, Clinton D    Anemia 10/06/2013   Anginal pain    pt has history of CP states had cardiac workup with no specific issues identified    Arthritis    knees; right thumb; shoulders   Arthritis of left shoulder region 09/30/2016   X-ray of the left shoulder on 09/14/2016: Marked degenerative change with significant osteophytic spurring and subchondral sclerosis.  Loss of glenohumeral joint space.  Well-maintained subacromial space.  Injected 09/30/2016 Katrinka Blazing .   Asthma, mild intermittent 04/02/2013   Chicken pox as a child   Colonic diverticular abscess 11/21/2012   Constipation    Decreased hearing    Dermatitis 03/06/2017   Diverticulitis 10/25/2012   pt. reports that a drain was placed - 09/2012     Diverticulitis of rectosigmoid 11/28/2012   Dry mouth    Essential hypertension 11/08/2010   Qualifier: Diagnosis of  By: Maple Hudson MD, Clinton D    Excessive thirst    External hemorrhoid, bleeding    "sometimes" (January 12, 2013)   Frequent urination    GERD (gastroesophageal reflux disease)     H/O hiatal hernia    Hand tingling    Heart murmur    History of kidney stones    Hyperglycemia 01/17/2020   Hyperlipidemia    Hypothyroidism    Incontinence    Insomnia    Kidney stones 1970's   "passed on their own" (Jan 12, 2013)   Low back pain 06/09/2014   Measles as a child   Mild neurocognitive disorder 02/05/2020   Obesity 09/11/2017   Obstructive sleep apnea 11/04/2010   NPSG [facility] 07/15/10- AHI 13.5/ hr CPAP 10/APS     Otitis media 01/07/2014   Palpitations    Paresthesia 03/23/2015   Left face   PONV (postoperative nausea and vomiting)    Rheumatoid arteritis    Shortness of breath dyspnea    using stairs   Sinus pain    Swelling of both lower extremities    Thyroid cancer 1980's   Tinnitus    UTI (urinary tract infection) 04/02/2013   Vertigo 08/05/2019   Vitamin D deficiency 02/28/2018    Past Surgical History:  Procedure Laterality Date   CHOLECYSTECTOMY  1990   COLON SURGERY     COLOSTOMY REVISION  01-12-13   Procedure: COLON RESECTION SIGMOID;  Surgeon: Cherylynn Ridges, MD;  Location: [facility] OR;  Service: General;  Laterality: N/A;   CYSTOSCOPY WITH STENT PLACEMENT  2013/01/12   Procedure: CYSTOSCOPY WITH STENT PLACEMENT;  Surgeon: Lindaann Slough,  MD;  Location: [facility] OR;  Service: Urology;  Laterality: N/A;   DILATION AND CURETTAGE OF UTERUS  1960's   "lots of them; had miscarriages" (12/19/2012)   LYSIS OF ADHESION N/A 12/02/2015   Procedure: LAPAROSCOPIC LYSIS OF ADHESION;  Surgeon: Luretha Murphy, MD;  Location: [facility];  Service: General;  Laterality: N/A;   ROBOTIC ASSISTED BILATERAL SALPINGO OOPHERECTOMY Bilateral 12/02/2015   Procedure: XI ROBOTIC ASSISTED BILATERAL SALPINGO OOPHORECTOMY;  Surgeon: Adolphus Birchwood, MD;  Location: [facility];  Service: Gynecology;  Laterality: Bilateral;   SIGMOID RESECTION / RECTOPEXY  12/19/2012   THYROIDECTOMY, PARTIAL  1988   "then did iodine to remove the rest" (12/19/2012)   TONSILLECTOMY  1951?   TOTAL KNEE ARTHROPLASTY Left 01/19/2021    Procedure: TOTAL KNEE ARTHROPLASTY;  Surgeon: Ollen Gross, MD;  Location: [facility];  Service: Orthopedics;  Laterality: Left;    TRANSRECTAL DRAINAGE OF PELVIC ABSCESS  10/27/2012   VAGINAL HYSTERECTOMY  1970's   "still have my ovaries" (12/19/2012)    Family History  Problem Relation Age of Onset   Alzheimer's disease Mother    Heart disease Mother    Depression Mother    Heart disease Father    Pneumonia Father    Hypertension Father    Hyperlipidemia Father    Cancer Father        skin   Stroke Father    Emphysema Brother        marijuana and cigarettes   Alcohol abuse Brother    Hearing loss Brother    Diabetes Maternal Grandmother    Alzheimer's disease Paternal Grandmother    Cancer Paternal Grandmother        lung?- smoker   Hyperlipidemia Paternal Grandmother    Heart attack Paternal Grandfather    Alcohol abuse Paternal Grandfather    Neurofibromatosis Son        schwanomatosis   Neurofibromatosis Son        swanomatosis   Cancer Paternal Aunt    Esophageal cancer Neg Hx    Colon cancer Neg Hx    Stomach cancer Neg Hx    Colon polyps Neg Hx     Social History   Socioeconomic History   Marital status: Widowed    Spouse name: Toiya Nachtman   Number of children: 2   Years of education: 12   Highest education level: Some college, no degree  Occupational History   Occupation: Retired  Tobacco Use   Smoking status: Never    Passive exposure: Never   Smokeless tobacco: Never   Tobacco comments:    Verified by Alain Honey  Vaping Use   Vaping status: Never Used  Substance and Sexual Activity   Alcohol use: No    Alcohol/week: 0.0 standard drinks of alcohol   Drug use: No   Sexual activity: Not Currently  Other Topics Concern   Not on file  Social History Narrative   Married - (husband passed )   Children   Right handed   Some college   Retired   3 sons   Social Determinants of Health   Financial Resource Strain: Low Risk  (08/30/2023)    Overall Financial Resource Strain (CARDIA)    Difficulty of Paying Living Expenses: Not hard at all  Food Insecurity: No Food Insecurity (08/30/2023)   Hunger Vital Sign    Worried About Running Out of Food in the Last Year: Never true    Ran Out of Food in the Last Year: Never  true  Transportation Needs: No Transportation Needs (08/30/2023)   PRAPARE - Administrator, Civil Service (Medical): No    Lack of Transportation (Non-Medical): No  Physical Activity: Insufficiently Active (08/30/2023)   Exercise Vital Sign    Days of Exercise per Week: 4 days    Minutes of Exercise per Session: 30 min  Stress: No Stress Concern Present (08/30/2023)   [employer] of Occupational Health - Occupational Stress Questionnaire    Feeling of Stress : Not at all  Social Connections: Moderately Integrated (08/30/2023)   Social Connection and Isolation Panel [NHANES]    Frequency of Communication with Friends and Family: More than three times a week    Frequency of Social Gatherings with Friends and Family: Three times a week    Attends Religious Services: 1 to 4 times per year    Active Member of Clubs or Organizations: Yes    Attends Banker Meetings: More than 4 times per year    Marital Status: Widowed  Intimate Partner Violence: Not At Risk (01/05/2023)   Humiliation, Afraid, Rape, and Kick questionnaire    Fear of Current or Ex-Partner: No    Emotionally Abused: No    Physically Abused: No    Sexually Abused: No    Outpatient Medications Prior to Visit  Medication Sig Dispense Refill   albuterol (VENTOLIN HFA) 108 (90 Base) MCG/ACT inhaler TAKE 2 PUFFS BY MOUTH EVERY 6 HOURS AS NEEDED FOR WHEEZE OR SHORTNESS OF BREATH 18 each 2   amLODipine (NORVASC) 2.5 MG tablet Take 1 tablet (2.5 mg total) by mouth daily. 90 tablet 1   Azelastine HCl 137 MCG/SPRAY SOLN PLACE 2 SPRAYS INTO BOTH NOSTRILS 2 (TWO) TIMES DAILY. 90 mL 4   benzonatate (TESSALON PERLES) 100 MG capsule  Take 1 capsule (100 mg total) by mouth 3 (three) times daily as needed for cough. 30 capsule 2   Black Pepper-Turmeric (TURMERIC CURCUMIN) 04-999 MG CAPS 1 tablet daily.     carboxymethylcellulose (REFRESH PLUS) 0.5 % SOLN Place 1 drop into both eyes 3 (three) times daily as needed (dry eyes).     Cholecalciferol-Vitamin C (VITAMIN D3-VITAMIN C) 1000-500 UNIT-MG CAPS daily.     docusate sodium (COLACE) 100 MG capsule Take 100 mg by mouth daily.     donepezil (ARICEPT) 10 MG tablet TAKE 1 TABLET BY MOUTH EVERY DAY 90 tablet 3   famotidine (PEPCID) 20 MG tablet TAKE 2 TABLETS (40 MG TOTAL) BY MOUTH DAILY. 180 tablet 1   FLUoxetine (PROZAC) 10 MG tablet TAKE 1 TABLET BY MOUTH EVERY DAY 90 tablet 1   gabapentin (NEURONTIN) 300 MG capsule TAKE 1 CAPSULE BY MOUTH EVERYDAY AT BEDTIME 30 capsule 1   hydrochlorothiazide (HYDRODIURIL) 25 MG tablet TAKE 1 TABLET (25 MG TOTAL) BY MOUTH DAILY. 90 tablet 1   levothyroxine (SYNTHROID) 125 MCG tablet TAKE 1 TABLET BY MOUTH DAILY BEFORE BREAKFAST. 90 tablet 1   losartan (COZAAR) 100 MG tablet TAKE 1 TABLET BY MOUTH EVERYDAY AT BEDTIME 90 tablet 1   memantine (NAMENDA) 10 MG tablet TAKE 1 TABLET BY MOUTH TWICE A DAY 180 tablet 2   metoprolol tartrate (LOPRESSOR) 100 MG tablet TAKE 1.5 TABLETS BY MOUTH 2 TIMES DAILY. 270 tablet 1   naproxen (NAPROSYN) 375 MG tablet TAKE 1 TABLET BY MOUTH TWICE A DAY AS NEEDED FOR MODERATE PAIN OR HEADACHE 60 tablet 3   nitroGLYCERIN (NITROSTAT) 0.4 MG SL tablet PLACE 1 TABLET UNDER THE TONGUE EVERY 5 MINUTES AS  NEEDED FOR CHEST PAIN 25 tablet 1   omeprazole (PRILOSEC) 40 MG capsule TAKE 1 CAPSULE BY MOUTH TWICE A DAY 180 capsule 1   ondansetron (ZOFRAN) 4 MG tablet Take 1 tablet (4 mg total) by mouth every 8 (eight) hours as needed for nausea or vomiting. 30 tablet 1   polyethylene glycol (MIRALAX / GLYCOLAX) 17 g packet Take 17 g by mouth daily.     Psyllium-Calcium (METAMUCIL PLUS CALCIUM) CAPS daily.     rosuvastatin (CRESTOR)  40 MG tablet TAKE 1 TABLET BY MOUTH EVERY DAY 90 tablet 1   temazepam (RESTORIL) 7.5 MG capsule 1 po every other day for 1 week, then every third day for 1 week. 7 capsule 0   No facility-administered medications prior to visit.    Allergies  Allergen Reactions   Neomycin-Bacitracin Zn-Polymyx Rash    Polysporin- is tolerated    Niacin Other (See Comments) and Cough    "cough til I threw up" (12/19/2012)   Ciprofloxacin Hives    Got cipro and flagyl at same time, localized hives to IV arm   Flagyl [Metronidazole] Hives    Got cipro and flagyl at same time, localized hives to IV arm   Neomycin Other (See Comments)    Review of Systems  Constitutional:  Negative for fever and malaise/fatigue.  HENT:  Negative for congestion.   Eyes:  Negative for blurred vision.  Respiratory:  Negative for shortness of breath.   Cardiovascular:  Negative for chest pain, palpitations and leg swelling.  Gastrointestinal:  Negative for abdominal pain, blood in stool and nausea.  Genitourinary:  Negative for dysuria and frequency.  Musculoskeletal:  Negative for falls.  Skin:  Negative for rash.  Neurological:  Negative for dizziness, loss of consciousness and headaches.  Endo/Heme/Allergies:  Negative for environmental allergies.  Psychiatric/Behavioral:  Negative for depression. The patient is not nervous/anxious.        Objective:    Physical Exam Constitutional:      General: She is not in acute distress.    Appearance: Normal appearance. She is well-developed. She is not toxic-appearing.  HENT:     Head: Normocephalic and atraumatic.     Right Ear: External ear normal.     Left Ear: External ear normal.     Nose: Nose normal.  Eyes:     General:        Right eye: No discharge.        Left eye: No discharge.     Conjunctiva/sclera: Conjunctivae normal.  Neck:     Thyroid: No thyromegaly.  Cardiovascular:     Rate and Rhythm: Normal rate and regular rhythm.     Heart sounds: Normal  heart sounds. No murmur heard. Pulmonary:     Effort: Pulmonary effort is normal. No respiratory distress.     Breath sounds: Normal breath sounds.  Abdominal:     General: Bowel sounds are normal.     Palpations: Abdomen is soft.     Tenderness: There is no abdominal tenderness. There is no guarding.  Musculoskeletal:        General: Normal range of motion.     Cervical back: Neck supple.  Lymphadenopathy:     Cervical: No cervical adenopathy.  Skin:    General: Skin is warm and dry.  Neurological:     Mental Status: She is alert and oriented to person, place, and time.  Psychiatric:        Mood and Affect: Mood normal.  Behavior: Behavior normal.        Thought Content: Thought content normal.        Judgment: Judgment normal.     BP 130/74 (BP Location: Left Arm, Patient Position: Sitting, Cuff Size: Normal)   Pulse 65   Temp 97.6 F (36.4 C) (Oral)   Resp 16   Ht 5\' 4"  (1.626 m)   Wt 192 lb 6.4 oz (87.3 kg)   SpO2 95%   BMI 33.03 kg/m  Wt Readings from Last 3 Encounters:  08/30/23 192 lb 6.4 oz (87.3 kg)  07/12/23 187 lb (84.8 kg)  06/14/23 184 lb (83.5 kg)    Diabetic Foot Exam - Simple   No data filed    Lab Results  Component Value Date   WBC 8.3 08/30/2023   HGB 12.8 08/30/2023   HCT 38.4 08/30/2023   PLT 269.0 08/30/2023   GLUCOSE 91 08/30/2023   CHOL 138 08/30/2023   TRIG 119.0 08/30/2023   HDL 66.40 08/30/2023   LDLDIRECT 81.0 02/12/2020   LDLCALC 48 08/30/2023   ALT 18 08/30/2023   AST 19 08/30/2023   NA 133 (L) 08/30/2023   K 4.4 08/30/2023   CL 96 08/30/2023   CREATININE 0.69 08/30/2023   BUN 16 08/30/2023   CO2 30 08/30/2023   TSH 2.37 08/30/2023   INR 1.0 01/12/2021   HGBA1C 5.5 08/30/2023    Lab Results  Component Value Date   TSH 2.37 08/30/2023   Lab Results  Component Value Date   WBC 8.3 08/30/2023   HGB 12.8 08/30/2023   HCT 38.4 08/30/2023   MCV 93.6 08/30/2023   PLT 269.0 08/30/2023   Lab Results   Component Value Date   NA 133 (L) 08/30/2023   K 4.4 08/30/2023   CO2 30 08/30/2023   GLUCOSE 91 08/30/2023   BUN 16 08/30/2023   CREATININE 0.69 08/30/2023   BILITOT 0.5 08/30/2023   ALKPHOS 82 08/30/2023   AST 19 08/30/2023   ALT 18 08/30/2023   PROT 6.2 08/30/2023   ALBUMIN 4.0 08/30/2023   CALCIUM 8.5 08/30/2023   ANIONGAP 8 01/20/2021   GFR 82.76 08/30/2023   Lab Results  Component Value Date   CHOL 138 08/30/2023   Lab Results  Component Value Date   HDL 66.40 08/30/2023   Lab Results  Component Value Date   LDLCALC 48 08/30/2023   Lab Results  Component Value Date   TRIG 119.0 08/30/2023   Lab Results  Component Value Date   CHOLHDL 2 08/30/2023   Lab Results  Component Value Date   HGBA1C 5.5 08/30/2023       Assessment & Plan:  Need for influenza vaccination -     Flu Vaccine Trivalent High Dose (Fluad)  Vitamin B12 deficiency -     Vitamin B12  Other specified hypothyroidism  Essential hypertension -     CBC with Differential/Platelet -     Comprehensive metabolic panel -     TSH  Hyperglycemia -     Hemoglobin A1c  Hyperlipidemia, mixed -     Lipid panel    Assessment and Plan    Iron Deficiency Anemia Iron stores low but not anemic. Iron supplementation has improved anemia. -Continue iron supplementation once daily. -Recheck iron studies in December 2024 or January 2025.  Vitamin B12 Deficiency B12 levels low normal. -Check B12 levels today to ensure they have not dropped further.  Hyperlipidemia Cholesterol last checked three months ago. -Check cholesterol levels today.  Diabetes Mellitus A1c last checked three months ago and was within normal range. -Check A1c today.  General Health Maintenance -Continue regular exercise at [facility] four times a week. -Continue healthy diet with whole grains, lean proteins, fresh fruits, and vegetables. -Consider watching "Blue Zones" on Netflix for additional health and  longevity tips. -Schedule follow-up visit in three to four months (between mid-December 2024 and mid-January 2025).         Danise Edge, MD

## 2023-09-05 ENCOUNTER — Other Ambulatory Visit: Payer: Self-pay | Admitting: Physician Assistant

## 2023-09-20 DIAGNOSIS — R7302 Impaired glucose tolerance (oral): Secondary | ICD-10-CM | POA: Diagnosis not present

## 2023-09-20 DIAGNOSIS — C73 Malignant neoplasm of thyroid gland: Secondary | ICD-10-CM | POA: Diagnosis not present

## 2023-09-20 DIAGNOSIS — E785 Hyperlipidemia, unspecified: Secondary | ICD-10-CM | POA: Diagnosis not present

## 2023-09-20 DIAGNOSIS — I700 Atherosclerosis of aorta: Secondary | ICD-10-CM | POA: Diagnosis not present

## 2023-09-20 DIAGNOSIS — I10 Essential (primary) hypertension: Secondary | ICD-10-CM | POA: Diagnosis not present

## 2023-09-20 DIAGNOSIS — E871 Hypo-osmolality and hyponatremia: Secondary | ICD-10-CM | POA: Diagnosis not present

## 2023-09-20 DIAGNOSIS — I6523 Occlusion and stenosis of bilateral carotid arteries: Secondary | ICD-10-CM | POA: Diagnosis not present

## 2023-09-20 DIAGNOSIS — R2681 Unsteadiness on feet: Secondary | ICD-10-CM | POA: Diagnosis not present

## 2023-09-20 DIAGNOSIS — G301 Alzheimer's disease with late onset: Secondary | ICD-10-CM | POA: Diagnosis not present

## 2023-09-20 DIAGNOSIS — D649 Anemia, unspecified: Secondary | ICD-10-CM | POA: Diagnosis not present

## 2023-09-20 DIAGNOSIS — E890 Postprocedural hypothyroidism: Secondary | ICD-10-CM | POA: Diagnosis not present

## 2023-09-21 ENCOUNTER — Other Ambulatory Visit: Payer: Self-pay | Admitting: Family Medicine

## 2023-10-02 ENCOUNTER — Other Ambulatory Visit: Payer: Self-pay | Admitting: Physician Assistant

## 2023-10-03 DIAGNOSIS — G4733 Obstructive sleep apnea (adult) (pediatric): Secondary | ICD-10-CM | POA: Diagnosis not present

## 2023-10-04 DIAGNOSIS — Z1231 Encounter for screening mammogram for malignant neoplasm of breast: Secondary | ICD-10-CM | POA: Diagnosis not present

## 2023-10-04 LAB — HM MAMMOGRAPHY

## 2023-10-05 ENCOUNTER — Encounter: Payer: Self-pay | Admitting: Family Medicine

## 2023-10-15 ENCOUNTER — Other Ambulatory Visit: Payer: Self-pay | Admitting: Family Medicine

## 2023-10-18 ENCOUNTER — Other Ambulatory Visit: Payer: Self-pay | Admitting: Family Medicine

## 2023-10-18 ENCOUNTER — Other Ambulatory Visit (INDEPENDENT_AMBULATORY_CARE_PROVIDER_SITE_OTHER): Payer: Medicare HMO

## 2023-10-18 DIAGNOSIS — D509 Iron deficiency anemia, unspecified: Secondary | ICD-10-CM | POA: Diagnosis not present

## 2023-10-18 LAB — CBC WITH DIFFERENTIAL/PLATELET
Basophils Absolute: 0.1 10*3/uL (ref 0.0–0.1)
Basophils Relative: 0.8 % (ref 0.0–3.0)
Eosinophils Absolute: 0.2 10*3/uL (ref 0.0–0.7)
Eosinophils Relative: 2.9 % (ref 0.0–5.0)
HCT: 38.1 % (ref 36.0–46.0)
Hemoglobin: 12.8 g/dL (ref 12.0–15.0)
Lymphocytes Relative: 19.7 % (ref 12.0–46.0)
Lymphs Abs: 1.6 10*3/uL (ref 0.7–4.0)
MCHC: 33.5 g/dL (ref 30.0–36.0)
MCV: 93.7 fL (ref 78.0–100.0)
Monocytes Absolute: 0.7 10*3/uL (ref 0.1–1.0)
Monocytes Relative: 9 % (ref 3.0–12.0)
Neutro Abs: 5.4 10*3/uL (ref 1.4–7.7)
Neutrophils Relative %: 67.6 % (ref 43.0–77.0)
Platelets: 274 10*3/uL (ref 150.0–400.0)
RBC: 4.07 Mil/uL (ref 3.87–5.11)
RDW: 12.7 % (ref 11.5–15.5)
WBC: 7.9 10*3/uL (ref 4.0–10.5)

## 2023-10-18 LAB — IBC + FERRITIN
Ferritin: 26.1 ng/mL (ref 10.0–291.0)
Iron: 48 ug/dL (ref 42–145)
Saturation Ratios: 13.5 % — ABNORMAL LOW (ref 20.0–50.0)
TIBC: 355.6 ug/dL (ref 250.0–450.0)
Transferrin: 254 mg/dL (ref 212.0–360.0)

## 2023-10-25 DIAGNOSIS — H5203 Hypermetropia, bilateral: Secondary | ICD-10-CM | POA: Diagnosis not present

## 2023-10-25 DIAGNOSIS — Z0100 Encounter for examination of eyes and vision without abnormal findings: Secondary | ICD-10-CM | POA: Diagnosis not present

## 2023-11-08 DIAGNOSIS — G309 Alzheimer's disease, unspecified: Secondary | ICD-10-CM | POA: Diagnosis not present

## 2023-11-08 DIAGNOSIS — M4800 Spinal stenosis, site unspecified: Secondary | ICD-10-CM | POA: Diagnosis not present

## 2023-11-08 DIAGNOSIS — K219 Gastro-esophageal reflux disease without esophagitis: Secondary | ICD-10-CM | POA: Diagnosis not present

## 2023-11-08 DIAGNOSIS — M1990 Unspecified osteoarthritis, unspecified site: Secondary | ICD-10-CM | POA: Diagnosis not present

## 2023-11-08 DIAGNOSIS — R32 Unspecified urinary incontinence: Secondary | ICD-10-CM | POA: Diagnosis not present

## 2023-11-08 DIAGNOSIS — R2681 Unsteadiness on feet: Secondary | ICD-10-CM | POA: Diagnosis not present

## 2023-11-08 DIAGNOSIS — E890 Postprocedural hypothyroidism: Secondary | ICD-10-CM | POA: Diagnosis not present

## 2023-11-08 DIAGNOSIS — K5900 Constipation, unspecified: Secondary | ICD-10-CM | POA: Diagnosis not present

## 2023-11-08 DIAGNOSIS — H269 Unspecified cataract: Secondary | ICD-10-CM | POA: Diagnosis not present

## 2023-11-08 DIAGNOSIS — E785 Hyperlipidemia, unspecified: Secondary | ICD-10-CM | POA: Diagnosis not present

## 2023-11-08 DIAGNOSIS — L409 Psoriasis, unspecified: Secondary | ICD-10-CM | POA: Diagnosis not present

## 2023-11-08 DIAGNOSIS — I10 Essential (primary) hypertension: Secondary | ICD-10-CM | POA: Diagnosis not present

## 2023-11-10 ENCOUNTER — Other Ambulatory Visit: Payer: Self-pay | Admitting: Family Medicine

## 2023-11-10 DIAGNOSIS — K219 Gastro-esophageal reflux disease without esophagitis: Secondary | ICD-10-CM

## 2023-11-10 DIAGNOSIS — R059 Cough, unspecified: Secondary | ICD-10-CM

## 2023-11-10 DIAGNOSIS — J302 Other seasonal allergic rhinitis: Secondary | ICD-10-CM

## 2023-12-05 ENCOUNTER — Other Ambulatory Visit: Payer: Self-pay | Admitting: Physician Assistant

## 2023-12-12 ENCOUNTER — Other Ambulatory Visit: Payer: Self-pay | Admitting: Physician Assistant

## 2023-12-12 NOTE — Assessment & Plan Note (Signed)
Supplement and monitor 

## 2023-12-12 NOTE — Assessment & Plan Note (Signed)
Well controlled, no changes to meds. Encouraged heart healthy diet such as the DASH diet and exercise as tolerated.  °

## 2023-12-12 NOTE — Assessment & Plan Note (Addendum)
hgba1c acceptable, minimize simple carbs. Increase exercise as tolerated.  

## 2023-12-12 NOTE — Assessment & Plan Note (Signed)
On Levothyroxine, continue to monitor 

## 2023-12-12 NOTE — Assessment & Plan Note (Signed)
Stable living at home with her son who accompanies her today

## 2023-12-12 NOTE — Assessment & Plan Note (Signed)
Encourage heart healthy diet such as MIND or DASH diet, increase exercise, avoid trans fats, simple carbohydrates and processed foods, consider a krill or fish or flaxseed oil cap daily.  Tolerating Rosuvastatin 

## 2023-12-13 ENCOUNTER — Ambulatory Visit (INDEPENDENT_AMBULATORY_CARE_PROVIDER_SITE_OTHER): Payer: Medicare HMO | Admitting: Family Medicine

## 2023-12-13 ENCOUNTER — Other Ambulatory Visit (HOSPITAL_BASED_OUTPATIENT_CLINIC_OR_DEPARTMENT_OTHER): Payer: Self-pay

## 2023-12-13 VITALS — BP 152/68 | HR 67 | Temp 98.2°F | Resp 16 | Ht 61.0 in | Wt 196.8 lb

## 2023-12-13 DIAGNOSIS — E559 Vitamin D deficiency, unspecified: Secondary | ICD-10-CM | POA: Diagnosis not present

## 2023-12-13 DIAGNOSIS — G8929 Other chronic pain: Secondary | ICD-10-CM | POA: Diagnosis not present

## 2023-12-13 DIAGNOSIS — I10 Essential (primary) hypertension: Secondary | ICD-10-CM

## 2023-12-13 DIAGNOSIS — M25552 Pain in left hip: Secondary | ICD-10-CM | POA: Diagnosis not present

## 2023-12-13 DIAGNOSIS — R739 Hyperglycemia, unspecified: Secondary | ICD-10-CM | POA: Diagnosis not present

## 2023-12-13 DIAGNOSIS — E038 Other specified hypothyroidism: Secondary | ICD-10-CM

## 2023-12-13 DIAGNOSIS — E782 Mixed hyperlipidemia: Secondary | ICD-10-CM

## 2023-12-13 DIAGNOSIS — M25512 Pain in left shoulder: Secondary | ICD-10-CM | POA: Diagnosis not present

## 2023-12-13 DIAGNOSIS — E538 Deficiency of other specified B group vitamins: Secondary | ICD-10-CM

## 2023-12-13 DIAGNOSIS — F0394 Unspecified dementia, unspecified severity, with anxiety: Secondary | ICD-10-CM

## 2023-12-13 MED ORDER — AREXVY 120 MCG/0.5ML IM SUSR
INTRAMUSCULAR | 0 refills | Status: DC
  Filled 2023-12-13: qty 0.5, 1d supply, fill #0

## 2023-12-13 MED ORDER — COVID-19 MRNA VAC-TRIS(PFIZER) 30 MCG/0.3ML IM SUSY
0.3000 mL | PREFILLED_SYRINGE | Freq: Once | INTRAMUSCULAR | 0 refills | Status: AC
  Filled 2023-12-13: qty 0.3, 1d supply, fill #0

## 2023-12-13 NOTE — Patient Instructions (Signed)
Respiratory Syncitial Virus Vaccine, RSV, Arexvy at pharmacy  Tetanus booster   Covid booster annually

## 2023-12-17 ENCOUNTER — Other Ambulatory Visit: Payer: Self-pay | Admitting: Family Medicine

## 2023-12-18 ENCOUNTER — Encounter: Payer: Self-pay | Admitting: Family Medicine

## 2023-12-18 NOTE — Progress Notes (Signed)
 Subjective:    Patient ID: Jackie Stevens, female    DOB: [birth year], 78 y.o.   MRN: [id number]  Chief Complaint  Patient presents with  . Follow-up    HPI Discussed the use of AI scribe software for clinical note transcription with the patient, who gave verbal consent to proceed.  History of Present Illness   The patient, an elderly woman with a history of degenerative arthritis, presents with significant shoulder pain. The pain is managed with extra strength Tylenol  twice a day and naproxen  at night. The caregiver, her son, reports that if the patient complains about the pain more than once during the day, she gives her naproxen  during the day as well. The patient's pain is significant enough to warrant consideration of more aggressive interventions. The patient also reports pain in the left arm and hip. There is no recent history of fall or injury. The patient's labs from three months ago show improvement, particularly in iron levels, due to consistent iron supplementation. The patient has not reported any new symptoms or complications since the last visit.        Past Medical History:  Diagnosis Date  . Abnormal cervical cytology 10/25/2012   Follows with Dr Devere Mayer of Gyn  . Allergic rhinitis 11/04/2010   Qualifier: Diagnosis of  By: Neysa MD, Clinton D   . Anemia 10/06/2013  . Anginal pain    pt has history of CP states had cardiac workup with no specific issues identified   . Arthritis    knees; right thumb; shoulders  . 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  Loss of glenohumeral joint space.  Well-maintained subacromial space.  Injected 09/30/2016 [facility] Sharps .  SABRA Asthma, mild intermittent 04/02/2013  . Chicken pox as a child  . Colonic diverticular abscess 11/21/2012  . Constipation   . Decreased hearing   . Dermatitis 03/06/2017   . Diverticulitis 10/25/2012   pt. reports that a drain was placed - 09/2012    . Diverticulitis of rectosigmoid 11/28/2012  . Dry mouth   . Essential hypertension 11/08/2010   Qualifier: Diagnosis of  By: Neysa MD, Clinton D   . Excessive thirst   . External hemorrhoid, bleeding    sometimes (08-Jan-2013)  . Frequent urination   . GERD (gastroesophageal reflux disease)   . H/O hiatal hernia   . Hand tingling   . Heart murmur   . History of kidney stones   . Hyperglycemia 01/17/2020  . Hyperlipidemia   . Hypothyroidism   . Incontinence   . Insomnia   . Kidney stones 1970's   passed on their own (08-Jan-2013)  . Low back pain 06/09/2014  . Measles as a child  . 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 Obesity 09/11/2017  . Obstructive sleep apnea 11/04/2010   NPSG [facility] 07/15/10- AHI 13.5/ hr CPAP 10/APS    . Otitis media 01/07/2014  . Palpitations   . Paresthesia 03/23/2015   Left face  . PONV (postoperative nausea and vomiting)   . Rheumatoid arteritis   . Shortness of breath dyspnea    using stairs  . Sinus pain   . Swelling of both lower extremities   . Thyroid  cancer 1980's  . Tinnitus   . UTI (urinary tract infection) 04/02/2013  . Vertigo 08/05/2019  . Vitamin D  deficiency 02/28/2018    Past Surgical History:  Procedure Laterality  Date  . CHOLECYSTECTOMY  1990  . COLON SURGERY    . COLOSTOMY REVISION  12/19/2012   Procedure: COLON RESECTION SIGMOID;  Surgeon: Lynwood MALVA Pina, MD;  Location: [facility] OR;  Service: General;  Laterality: N/A;  . CYSTOSCOPY WITH STENT PLACEMENT  12/19/2012   Procedure: CYSTOSCOPY WITH STENT PLACEMENT;  Surgeon: Thomasine Oiler, MD;  Location: [facility] OR;  Service: Urology;  Laterality: N/A;  . DILATION AND CURETTAGE OF UTERUS  1960's   lots of them; had miscarriages (12/19/2012)  . LYSIS OF ADHESION N/A 12/02/2015   Procedure: LAPAROSCOPIC LYSIS OF ADHESION;  Surgeon: Donnice Lunger, MD;  Location: [facility] ORS;  Service: General;  Laterality: N/A;  .  ROBOTIC ASSISTED BILATERAL SALPINGO OOPHERECTOMY Bilateral 12/02/2015   Procedure: XI ROBOTIC ASSISTED BILATERAL SALPINGO OOPHORECTOMY;  Surgeon: Maurilio Ship, MD;  Location: [facility] ORS;  Service: Gynecology;  Laterality: Bilateral;  . SIGMOID RESECTION / RECTOPEXY  12/19/2012  . THYROIDECTOMY, PARTIAL  1988   then did iodine  to remove the rest (12/19/2012)  . TONSILLECTOMY  1951?  . TOTAL KNEE ARTHROPLASTY Left 01/19/2021   Procedure: TOTAL KNEE ARTHROPLASTY;  Surgeon: Melodi Lerner, MD;  Location: [facility] ORS;  Service: Orthopedics;  Laterality: Left;   . TRANSRECTAL DRAINAGE OF PELVIC ABSCESS  10/27/2012  . VAGINAL HYSTERECTOMY  1970's   still have my ovaries (12/19/2012)    Family History  Problem Relation Age of Onset  . Alzheimer's disease Mother   . Heart disease Mother   . Depression Mother   . Heart disease Father   . Pneumonia Father   . Hypertension Father   . Hyperlipidemia Father   . Cancer Father        skin  . Stroke Father   . Emphysema Brother        marijuana and cigarettes  . Alcohol  abuse Brother   . Hearing loss Brother   . Diabetes Maternal Grandmother   . Alzheimer's disease Paternal Grandmother   . Cancer Paternal Grandmother        lung?- smoker  . Hyperlipidemia Paternal Grandmother   . Heart attack Paternal Grandfather   . Alcohol  abuse Paternal Grandfather   . Neurofibromatosis Son        schwanomatosis  . Neurofibromatosis Son        swanomatosis  . Cancer Paternal Aunt   . Esophageal cancer Neg Hx   . Colon cancer Neg Hx   . Stomach cancer Neg Hx   . Colon polyps Neg Hx     Social History   Socioeconomic History  . Marital status: Widowed    Spouse name: Kamyia Thomason  . Number of children: 2  . Years of education: 33  . Highest education level: Some college, no degree  Occupational History  . Occupation: Retired  Tobacco Use  . Smoking status: Never    Passive exposure: Never  . Smokeless tobacco: Never  . Tobacco comments:     Verified by Gilmer Jeralyn Munroe  Vaping Use  . Vaping status: Never Used  Substance and Sexual Activity  . Alcohol  use: No    Alcohol /week: 0.0 standard drinks of alcohol   . Drug use: No  . Sexual activity: Not Currently  Other Topics Concern  . Not on file  Social History Narrative   Married - (husband passed )   Children   Right handed   Some college   Retired   3 sons   Social Drivers of Corporate Investment Banker Strain: Low Risk  (  08/30/2023)   Overall Financial Resource Strain (CARDIA)   . Difficulty of Paying Living Expenses: Not hard at all  Food Insecurity: No Food Insecurity (08/30/2023)   Hunger Vital Sign   . Worried About Programme Researcher, Broadcasting/film/video in the Last Year: Never true   . Ran Out of Food in the Last Year: Never true  Transportation Needs: No Transportation Needs (08/30/2023)   PRAPARE - Transportation   . Lack of Transportation (Medical): No   . Lack of Transportation (Non-Medical): No  Physical Activity: Insufficiently Active (08/30/2023)   Exercise Vital Sign   . Days of Exercise per Week: 4 days   . Minutes of Exercise per Session: 30 min  Stress: No Stress Concern Present (08/30/2023)   [employer] of Occupational Health - Occupational Stress Questionnaire   . Feeling of Stress : Not at all  Social Connections: Moderately Integrated (08/30/2023)   Social Connection and Isolation Panel [NHANES]   . Frequency of Communication with Friends and Family: More than three times a week   . Frequency of Social Gatherings with Friends and Family: Three times a week   . Attends Religious Services: 1 to 4 times per year   . Active Member of Clubs or Organizations: Yes   . Attends Banker Meetings: More than 4 times per year   . Marital Status: Widowed  Intimate Partner Violence: Not At Risk (01/05/2023)   Humiliation, Afraid, Rape, and Kick questionnaire   . Fear of Current or Ex-Partner: No   . Emotionally Abused: No   . Physically Abused: No   .  Sexually Abused: No    Outpatient Medications Prior to Visit  Medication Sig Dispense Refill  . albuterol  (VENTOLIN  HFA) 108 (90 Base) MCG/ACT inhaler TAKE 2 PUFFS BY MOUTH EVERY 6 HOURS AS NEEDED FOR WHEEZE OR SHORTNESS OF BREATH 18 each 2  . amLODipine  (NORVASC ) 2.5 MG tablet TAKE 1 TABLET BY MOUTH EVERY DAY 90 tablet 1  . Azelastine  HCl 137 MCG/SPRAY SOLN PLACE 2 SPRAYS INTO BOTH NOSTRILS 2 (TWO) TIMES DAILY 30 mL 1  . benzonatate  (TESSALON  PERLES) 100 MG capsule Take 1 capsule (100 mg total) by mouth 3 (three) times daily as needed for cough. 30 capsule 2  . Black Pepper-Turmeric (TURMERIC CURCUMIN) 04-999 MG CAPS 1 tablet daily.    . carboxymethylcellulose (REFRESH PLUS) 0.5 % SOLN Place 1 drop into both eyes 3 (three) times daily as needed (dry eyes).    . Cholecalciferol-Vitamin C (VITAMIN D3-VITAMIN C) 1000-500 UNIT-MG CAPS daily.    . docusate sodium  (COLACE) 100 MG capsule Take 100 mg by mouth daily.    . donepezil  (ARICEPT ) 10 MG tablet TAKE 1 TABLET BY MOUTH EVERY DAY 90 tablet 3  . famotidine  (PEPCID ) 20 MG tablet TAKE 2 TABLETS (40 MG TOTAL) BY MOUTH DAILY. 180 tablet 1  . FLUoxetine  (PROZAC ) 10 MG tablet TAKE 1 TABLET BY MOUTH EVERY DAY 90 tablet 1  . gabapentin  (NEURONTIN ) 300 MG capsule TAKE 1 CAPSULE BY MOUTH EVERYDAY AT BEDTIME 30 capsule 1  . hydrochlorothiazide  (HYDRODIURIL ) 25 MG tablet TAKE 1 TABLET (25 MG TOTAL) BY MOUTH DAILY. 90 tablet 1  . levothyroxine  (SYNTHROID ) 125 MCG tablet TAKE 1 TABLET BY MOUTH DAILY BEFORE BREAKFAST. 90 tablet 1  . losartan  (COZAAR ) 100 MG tablet TAKE 1 TABLET BY MOUTH EVERYDAY AT BEDTIME 90 tablet 1  . memantine  (NAMENDA ) 10 MG tablet TAKE 1 TABLET BY MOUTH TWICE A DAY 180 tablet 2  . metoprolol  tartrate (LOPRESSOR )  100 MG tablet TAKE 1.5 TABLETS BY MOUTH 2 TIMES DAILY. 270 tablet 1  . naproxen  (NAPROSYN ) 375 MG tablet TAKE 1 TABLET BY MOUTH TWICE A DAY AS NEEDED FOR MODERATE PAIN OR HEADACHE 60 tablet 3  . nitroGLYCERIN  (NITROSTAT ) 0.4 MG  SL tablet PLACE 1 TABLET UNDER THE TONGUE EVERY 5 MINUTES AS NEEDED FOR CHEST PAIN 25 tablet 1  . omeprazole  (PRILOSEC ) 40 MG capsule TAKE 1 CAPSULE BY MOUTH TWICE A DAY 180 capsule 1  . ondansetron  (ZOFRAN ) 4 MG tablet Take 1 tablet (4 mg total) by mouth every 8 (eight) hours as needed for nausea or vomiting. 30 tablet 1  . polyethylene glycol (MIRALAX  / GLYCOLAX ) 17 g packet Take 17 g by mouth daily.    . Psyllium-Calcium  (METAMUCIL PLUS CALCIUM ) CAPS daily.    . rosuvastatin  (CRESTOR ) 40 MG tablet TAKE 1 TABLET BY MOUTH EVERY DAY 90 tablet 1  . temazepam  (RESTORIL ) 7.5 MG capsule 1 po every other day for 1 week, then every third day for 1 week. 7 capsule 0   No facility-administered medications prior to visit.    Allergies  Allergen Reactions  . Neomycin -Bacitracin Zn-Polymyx Rash    Polysporin- is tolerated   . Niacin Other (See Comments) and Cough    cough til I threw up (12/19/2012)  . Ciprofloxacin  Hives    Got cipro  and flagyl  at same time, localized hives to IV arm  . Flagyl  [Metronidazole ] Hives    Got cipro  and flagyl  at same time, localized hives to IV arm  . Neomycin  Other (See Comments)    Review of Systems  Constitutional:  Negative for fever and malaise/fatigue.  HENT:  Negative for congestion.   Eyes:  Negative for blurred vision.  Respiratory:  Negative for shortness of breath.   Cardiovascular:  Negative for chest pain, palpitations and leg swelling.  Gastrointestinal:  Negative for abdominal pain, blood in stool and nausea.  Genitourinary:  Negative for dysuria and frequency.  Musculoskeletal:  Positive for joint pain. Negative for falls.  Skin:  Negative for rash.  Neurological:  Negative for dizziness, loss of consciousness and headaches.  Endo/Heme/Allergies:  Negative for environmental allergies.  Psychiatric/Behavioral:  Positive for memory loss. Negative for depression. The patient is not nervous/anxious.        Objective:    Physical  Exam Constitutional:      General: She is not in acute distress.    Appearance: Normal appearance. She is well-developed. She is not toxic-appearing.  HENT:     Head: Normocephalic and atraumatic.     Right Ear: External ear normal.     Left Ear: External ear normal.     Nose: Nose normal.  Eyes:     General:        Right eye: No discharge.        Left eye: No discharge.     Conjunctiva/sclera: Conjunctivae normal.  Neck:     Thyroid : No thyromegaly.  Cardiovascular:     Rate and Rhythm: Normal rate and regular rhythm.     Heart sounds: Normal heart sounds. No murmur heard. Pulmonary:     Effort: Pulmonary effort is normal. No respiratory distress.     Breath sounds: Normal breath sounds.  Abdominal:     General: Bowel sounds are normal.     Palpations: Abdomen is soft.     Tenderness: There is no abdominal tenderness. There is no guarding.  Musculoskeletal:        General: Normal range  of motion.     Cervical back: Neck supple.  Lymphadenopathy:     Cervical: No cervical adenopathy.  Skin:    General: Skin is warm and dry.  Neurological:     Mental Status: She is alert and oriented to person, place, and time.  Psychiatric:        Mood and Affect: Mood normal.        Behavior: Behavior normal.        Thought Content: Thought content normal.        Judgment: Judgment normal.    BP (!) 152/68 (BP Location: Left Arm, Patient Position: Sitting, Cuff Size: Large)   Pulse 67   Temp 98.2 F (36.8 C) (Oral)   Resp 16   Ht 5' 1 (1.549 m)   Wt 196 lb 12.8 oz (89.3 kg)   SpO2 97%   BMI 37.19 kg/m  Wt Readings from Last 3 Encounters:  12/13/23 196 lb 12.8 oz (89.3 kg)  08/30/23 192 lb 6.4 oz (87.3 kg)  07/12/23 187 lb (84.8 kg)    Diabetic Foot Exam - Simple   No data filed    Lab Results  Component Value Date   WBC 7.9 10/18/2023   HGB 12.8 10/18/2023   HCT 38.1 10/18/2023   PLT 274.0 10/18/2023   GLUCOSE 91 08/30/2023   CHOL 138 08/30/2023   TRIG 119.0  08/30/2023   HDL 66.40 08/30/2023   LDLDIRECT 81.0 02/12/2020   LDLCALC 48 08/30/2023   ALT 18 08/30/2023   AST 19 08/30/2023   NA 133 (L) 08/30/2023   K 4.4 08/30/2023   CL 96 08/30/2023   CREATININE 0.69 08/30/2023   BUN 16 08/30/2023   CO2 30 08/30/2023   TSH 2.37 08/30/2023   INR 1.0 01/12/2021   HGBA1C 5.5 08/30/2023    Lab Results  Component Value Date   TSH 2.37 08/30/2023   Lab Results  Component Value Date   WBC 7.9 10/18/2023   HGB 12.8 10/18/2023   HCT 38.1 10/18/2023   MCV 93.7 10/18/2023   PLT 274.0 10/18/2023   Lab Results  Component Value Date   NA 133 (L) 08/30/2023   K 4.4 08/30/2023   CO2 30 08/30/2023   GLUCOSE 91 08/30/2023   BUN 16 08/30/2023   CREATININE 0.69 08/30/2023   BILITOT 0.5 08/30/2023   ALKPHOS 82 08/30/2023   AST 19 08/30/2023   ALT 18 08/30/2023   PROT 6.2 08/30/2023   ALBUMIN  4.0 08/30/2023   CALCIUM  8.5 08/30/2023   ANIONGAP 8 01/20/2021   GFR 82.76 08/30/2023   Lab Results  Component Value Date   CHOL 138 08/30/2023   Lab Results  Component Value Date   HDL 66.40 08/30/2023   Lab Results  Component Value Date   LDLCALC 48 08/30/2023   Lab Results  Component Value Date   TRIG 119.0 08/30/2023   Lab Results  Component Value Date   CHOLHDL 2 08/30/2023   Lab Results  Component Value Date   HGBA1C 5.5 08/30/2023       Assessment & Plan:  Dementia with anxiety, unspecified dementia severity, unspecified dementia type (HCC) Assessment & Plan: Stable living at home with her son who accompanies her today   Essential hypertension Assessment & Plan: Well controlled, no changes to meds. Encouraged heart healthy diet such as the DASH diet and exercise as tolerated.     Hyperglycemia Assessment & Plan: hgba1c acceptable, minimize simple carbs. Increase exercise as tolerated.    Hyperlipidemia,  mixed Assessment & Plan: Encourage heart healthy diet such as MIND or DASH diet, increase exercise, avoid trans  fats, simple carbohydrates and processed foods, consider a krill or fish or flaxseed oil cap daily. Tolerating Rosuvastatin    Other specified hypothyroidism Assessment & Plan: On Levothyroxine , continue to monitor   Vitamin B12 deficiency Assessment & Plan: Supplement and monitor    Vitamin D  deficiency Assessment & Plan: Supplement and monitor    Chronic left shoulder pain -     Ambulatory referral to Sports Medicine  Left hip pain -     Ambulatory referral to Sports Medicine    Assessment and Plan    Degenerative Arthritis Severe pain in left shoulder and hip, managed with Tylenol  and Naproxen . Discussed the risks/benefits of more aggressive interventions such as surgery and joint injections. -Refer to sports medicine for evaluation and possible joint injections. -Continue Tylenol  and Naproxen  as needed for pain.  General Health Maintenance Discussed the importance of staying up-to-date with vaccinations, especially given the patient's age and risk factors. -Administer RSV vaccine Isla) at pharmacy. -Administer COVID-19 booster at pharmacy. -Administer Tetanus booster at pharmacy in a couple of weeks. -Plan to continue annual COVID-19 and flu vaccinations.  Follow-up in 3-4 months for routine check-up and lab work.         Harlene Horton, MD

## 2023-12-19 NOTE — Progress Notes (Signed)
 Assessment/Plan:   Dementia likely due to Alzheimer's disease  Jackie Stevens is a very pleasant 78 y.o. RH female with a history of hypertension, hyperlipidemia, thyroid  cancer s/p partial thyroidectomy, arthritis with chronic pain, chronic anemia of iron deficiency, B12 deficiency, vitamin D  deficiency, hyperglycemia seen today in follow up for memory loss. Patient is currently on donepezil  10 milligrams daily and memantine  10 mg twice daily. Unfortunately, cognitive decline is noted, today's MMSE is 13/30 (was 17/30  6 months ago). She is still able to perform some ADLs to her ability. Mood is good. Discussed with her son that the risks of increasing donepezil  to 23 mg daily would outweigh the benefits, may not be therapeutic. He agrees with the plan to stay for now on present therapy.    Follow up in  6 months. Continue donepezil  10 mg daily  and memantine  10 mg twice daily, side effects discussed Recommend good control of her cardiovascular risk factors. Informed of elevated BP today Continue to control mood as per PCP     Subjective:    This patient is accompanied in the office by her son  who supplements the history.  Previous records as well as any outside records available were reviewed prior to todays visit. Patient was last seen on July 2024 with an MMSE 17/30.    Any changes in memory since last visit? A little worse-son says. Patient has more  difficulty remembering recent conversations and names of people. She goes to [employer] 4 times a week from 10-2 pm, enjoys it very much.  She likes to make ornaments, watching TV, listening to music, and playing games.  [place] other [place]. She is forgetting how her children look like, needs to be reminded of their names.  repeats oneself?  Endorsed maybe a little more than before  Disoriented when walking into a room?  Patient denies being disoriented at home, although sometimes she may be confused about the  location is this [place]  or [place]    Leaving objects?  May misplace things but not in unusual places. She likes to collect Christmas ornaments.  Wandering behavior?  denies.  She is afraid of walking outside without company. Any personality changes since last visit?  denies   Any worsening depression?:  Denies.   Hallucinations or paranoia? Sometimes she thinks that there is someone with a flashlight but it is a car passing by. Seizures? denies    Any sleep changes?  Sleeps well unless she has to go to the bathroom.  Denies vivid dreams, REM behavior or sleepwalking   Sleep apnea?  Endorsed, uses CPAP.  Any hygiene concerns? Denies.  Independent of bathing and dressing?  Endorsed  Does the patient needs help with medications? Son  is in charge   Who is in charge of the finances? Both sons are in charge      Any changes in appetite?  Denies.     Patient have trouble swallowing? Denies.   Does the patient cook? No. Son prepares the meals Any headaches?   Denies.   Chronic back pain he has chronic left shoulder pain and hip pain due to arthritis. Son to have injections.  Ambulates with difficulty? Denies.  Uses a walker for stability and safety.  Recent falls or head injuries? denies     Unilateral weakness, numbness or tingling? denies   Any tremors?  Denies   Any anosmia?  Denies   Any incontinence of  urine?  Endorsed, uses diapers.   Any bowel dysfunction?   Denies      Patient lives with her son  Does the patient drive? No longer drives     History on Initial Assessment 11/26/2019: This is a pleasant 78 year old right-handed woman with a history of hypertension, hyperlipidemia, thyroid  cancer s/p partial thyroidectomy, presenting for evaluation of memory loss. She does not think her memory is good, there are certain things she cannot remember. She looks to her husband for answers several times during the visit. She lives with her husband and son. She manages medications  without difficulties and denies getting lost driving. She became concerned about her memory because she had always used to do their checkbook but in February, something was not right. She could not figure it out, and stopped doing it since then. She states she is not even sure she can do it now. Her husband does not think there is anything significantly concerning about her memory. He reports the checkbook issue threw her off. She gets flustered when she does not remember things. He has not noticed any personality changes but they both note that she is more paranoid, closing the blinds at night. They have lived in the same house for 37 years but for the past 6 months, she feels like someone is always looking in their house and will shoot at them, I don't know why. She and her husband deny any hallucinations. She has always been worried about things, and is anxious today. She states mood is I don't know, she is afraid to do some things but thinks mood is alright. She notes some depression due to inability to travel like before. Her mother had Alzheimer's disease. No history of significant head injuries. She rarely drinks alcohol .    She denies any headaches, diplopia, dysarthria/dysphagia, neck/back pain, focal numbness/tingling/weakness, bowel/bladder dysfunction, tremors. She had a lot of dizziness for a time where she would feel lightheaded and have to hold on. I personally reviewed head CT without contrast done in August 2020 for dizziness which did not show any acute changes, there was moderate diffuse atrophy. She had an infection prior to the start of Covid-19 where she lost her sense of taste and smell, it has not come back. She usually gets 7 hours of sleep with her CPAP machine, waking up at 4am. She is occasionally drowsy in the day.  PREVIOUS MEDICATIONS:   CURRENT MEDICATIONS:  Outpatient Encounter Medications as of 12/20/2023  Medication Sig   albuterol  (VENTOLIN  HFA) 108 (90 Base) MCG/ACT  inhaler TAKE 2 PUFFS BY MOUTH EVERY 6 HOURS AS NEEDED FOR WHEEZE OR SHORTNESS OF BREATH   amLODipine  (NORVASC ) 2.5 MG tablet TAKE 1 TABLET BY MOUTH EVERY DAY   Azelastine  HCl 137 MCG/SPRAY SOLN PLACE 2 SPRAYS INTO BOTH NOSTRILS 2 (TWO) TIMES DAILY   benzonatate  (TESSALON  PERLES) 100 MG capsule Take 1 capsule (100 mg total) by mouth 3 (three) times daily as needed for cough.   Black Pepper-Turmeric (TURMERIC CURCUMIN) 04-999 MG CAPS 1 tablet daily.   carboxymethylcellulose (REFRESH PLUS) 0.5 % SOLN Place 1 drop into both eyes 3 (three) times daily as needed (dry eyes).   Cholecalciferol-Vitamin C (VITAMIN D3-VITAMIN C) 1000-500 UNIT-MG CAPS daily.   docusate sodium  (COLACE) 100 MG capsule Take 100 mg by mouth daily.   famotidine  (PEPCID ) 20 MG tablet TAKE 2 TABLETS (40 MG TOTAL) BY MOUTH DAILY.   FLUoxetine  (PROZAC ) 10 MG tablet TAKE 1 TABLET BY MOUTH EVERY DAY  gabapentin  (NEURONTIN ) 300 MG capsule TAKE 1 CAPSULE BY MOUTH EVERYDAY AT BEDTIME   hydrochlorothiazide  (HYDRODIURIL ) 25 MG tablet TAKE 1 TABLET (25 MG TOTAL) BY MOUTH DAILY.   levothyroxine  (SYNTHROID ) 125 MCG tablet TAKE 1 TABLET BY MOUTH DAILY BEFORE BREAKFAST.   losartan  (COZAAR ) 100 MG tablet TAKE 1 TABLET BY MOUTH EVERYDAY AT BEDTIME   metoprolol  tartrate (LOPRESSOR ) 100 MG tablet TAKE 1.5 TABLETS BY MOUTH 2 TIMES DAILY.   naproxen  (NAPROSYN ) 375 MG tablet TAKE 1 TABLET BY MOUTH TWICE A DAY AS NEEDED FOR MODERATE PAIN OR HEADACHE   nitroGLYCERIN  (NITROSTAT ) 0.4 MG SL tablet PLACE 1 TABLET UNDER THE TONGUE EVERY 5 MINUTES AS NEEDED FOR CHEST PAIN   omeprazole  (PRILOSEC ) 40 MG capsule TAKE 1 CAPSULE BY MOUTH TWICE A DAY   ondansetron  (ZOFRAN ) 4 MG tablet Take 1 tablet (4 mg total) by mouth every 8 (eight) hours as needed for nausea or vomiting.   polyethylene glycol (MIRALAX  / GLYCOLAX ) 17 g packet Take 17 g by mouth daily.   Psyllium-Calcium  (METAMUCIL PLUS CALCIUM ) CAPS daily.   rosuvastatin  (CRESTOR ) 40 MG tablet TAKE 1 TABLET BY  MOUTH EVERY DAY   RSV vaccine recomb adjuvanted (AREXVY ) 120 MCG/0.5ML injection Inject into the muscle.   temazepam  (RESTORIL ) 7.5 MG capsule 1 po every other day for 1 week, then every third day for 1 week.   [DISCONTINUED] donepezil  (ARICEPT ) 10 MG tablet TAKE 1 TABLET BY MOUTH EVERY DAY   [DISCONTINUED] memantine  (NAMENDA ) 10 MG tablet TAKE 1 TABLET BY MOUTH TWICE A DAY   donepezil  (ARICEPT ) 10 MG tablet TAKE 1 TABLET BY MOUTH EVERY DAY   memantine  (NAMENDA ) 10 MG tablet Take 1 tablet (10 mg total) by mouth 2 (two) times daily.   No facility-administered encounter medications on file as of 12/20/2023.       12/20/2023   11:00 AM 12/07/2022    9:00 AM 05/03/2022    3:00 PM  MMSE - Mini Mental State Exam  Orientation to time 0 0 0  Orientation to Place 0 4 0  Registration 3 1 3   Attention/ Calculation 2 3 2   Recall 0 2 0  Language- name 2 objects 2 1 2   Language- repeat 1 1 1   Language- follow 3 step command 3 2 3   Language- read & follow direction 1 1 1   Write a sentence 1 1 0  Copy design 0 0 0  Total score 13 16 12        No data to display          Objective:     PHYSICAL EXAMINATION:    VITALS:   Vitals:   12/20/23 0922  BP: (!) 159/65  Resp: 20  SpO2: 95%  Weight: 199 lb (90.3 kg)  Height: 5' 1 (1.549 m)    GEN:  The patient appears stated age and is in NAD. HEENT:  Normocephalic, atraumatic.   Neurological examination:  General: NAD, well-groomed, appears stated age. Orientation: The patient is alert. Oriented to person, not to place and date Cranial nerves: There is good facial symmetry.The speech is fluent and clear. No aphasia or dysarthria. Fund of knowledge is reduced. Recent and remote memory are impaired. Attention and concentration are reduced.  Able to name objects and repeat phrases.  Hearing is intact to conversational tone.  Delayed recall 0/3 Sensation: Sensation is intact to light touch throughout Motor: Strength is at least antigravity  x4. DTR's 2/4 in UE/LE     Movement examination: Tone: There  is normal tone in the UE/LE Abnormal movements:  no tremor.  No myoclonus.  No asterixis.   Coordination:  There is no decremation with RAM's. Normal finger to nose  Gait and Station: The patient has  some difficulty arising out of a deep-seated chair without the use of the hands, needs a walker to ambulate. The patient's stride length is good.  Gait is cautious and narrow.    Thank you for allowing us  the opportunity to participate in the care of this nice patient. Please do not hesitate to contact us  for any questions or concerns.   Total time spent on today's visit was 31 minutes dedicated to this patient today, preparing to see patient, examining the patient, ordering tests and/or medications and counseling the patient, documenting clinical information in the EHR or other health record, independently interpreting results and communicating results to the patient/family, discussing treatment and goals, answering patient's questions and coordinating care.  Cc:  Domenica Harlene LABOR, MD  Camie Sevin 12/20/2023 11:28 AM

## 2023-12-20 ENCOUNTER — Ambulatory Visit: Payer: Medicare HMO | Admitting: Physician Assistant

## 2023-12-20 ENCOUNTER — Encounter: Payer: Self-pay | Admitting: Physician Assistant

## 2023-12-20 VITALS — BP 159/65 | Resp 20 | Ht 61.0 in | Wt 199.0 lb

## 2023-12-20 DIAGNOSIS — G309 Alzheimer's disease, unspecified: Secondary | ICD-10-CM

## 2023-12-20 DIAGNOSIS — F0280 Dementia in other diseases classified elsewhere without behavioral disturbance: Secondary | ICD-10-CM | POA: Diagnosis not present

## 2023-12-20 MED ORDER — DONEPEZIL HCL 10 MG PO TABS: ORAL_TABLET | ORAL | 3 refills | Status: DC

## 2023-12-20 MED ORDER — MEMANTINE HCL 10 MG PO TABS: 10.0000 mg | ORAL_TABLET | Freq: Two times a day (BID) | ORAL | 2 refills | Status: DC

## 2023-12-20 NOTE — Patient Instructions (Addendum)
    Follow up in  6 months. Continue donepezil 10 mg daily. Side effects were discussed  Continue Memantine 10 mg twice daily. Side effects were discussed  Continue taking the vitamins Continue the adult day program

## 2023-12-27 ENCOUNTER — Other Ambulatory Visit: Payer: Self-pay

## 2023-12-27 ENCOUNTER — Ambulatory Visit: Payer: Medicare HMO | Admitting: Family Medicine

## 2023-12-27 ENCOUNTER — Ambulatory Visit (INDEPENDENT_AMBULATORY_CARE_PROVIDER_SITE_OTHER): Payer: Medicare HMO

## 2023-12-27 VITALS — BP 108/74 | HR 56 | Ht 61.0 in | Wt 205.0 lb

## 2023-12-27 DIAGNOSIS — G8929 Other chronic pain: Secondary | ICD-10-CM | POA: Diagnosis not present

## 2023-12-27 DIAGNOSIS — M25552 Pain in left hip: Secondary | ICD-10-CM

## 2023-12-27 DIAGNOSIS — M160 Bilateral primary osteoarthritis of hip: Secondary | ICD-10-CM | POA: Diagnosis not present

## 2023-12-27 DIAGNOSIS — M25512 Pain in left shoulder: Secondary | ICD-10-CM | POA: Diagnosis not present

## 2023-12-27 DIAGNOSIS — M19012 Primary osteoarthritis, left shoulder: Secondary | ICD-10-CM | POA: Diagnosis not present

## 2023-12-27 DIAGNOSIS — M47816 Spondylosis without myelopathy or radiculopathy, lumbar region: Secondary | ICD-10-CM | POA: Diagnosis not present

## 2023-12-27 NOTE — Progress Notes (Signed)
 LILLETTE Jackie Collet, PhD, LAT, ATC acting as a scribe for Artist Lloyd, MD.  Jackie Stevens is a 78 y.o. female who presents to [facility] today for L shoulder pain x 3-4 years. She suffered a fall several years ago and has been complaining about her L shoulder and hip since. Pt locates pain to all over the [facility] joint w/ radiating pain into the upper arm and forearm. Hx of dementia.   Radiates: yes Aggravates: ext, IR, aBd Treatments tried: naproxen , Tylenol ,   Pt also c/o L hip pain. Pt locates pain to the lateral aspect of her L hip w/ radiating pain along the lateral-anterior thigh.    Pertinent review of systems: No fevers or chills  Relevant historical information: Mild dimension.  Lives in [place] and is driven to appointments by her son.   Exam:  BP 108/74   Pulse (!) 56   Ht 5' 1 (1.549 m)   Wt 205 lb (93 kg)   SpO2 99%   BMI 38.73 kg/m  General: Well Developed, well nourished, and in no acute distress.   MSK: Left shoulder normal-appearing decreased range of motion.  Pain with abduction.  Strength reduced abduction.  Left hip normal-appearing Tender palpation greater trochanter.  Normal motion pain and weakness present with abduction strength testing.    Lab and Radiology Results  Procedure: Real-time Ultrasound Guided Injection of left shoulder glenohumeral joint posterior approach Device: Philips Affiniti 50G/GE Logiq Images permanently stored and available for review in PACS Verbal informed consent obtained.  Discussed risks and benefits of procedure. Warned about infection, bleeding, hyperglycemia damage to structures among others. Patient expresses understanding and agreement Time-out conducted.   Noted no overlying erythema, induration, or other signs of local infection.   Skin prepped in a sterile fashion.   Local anesthesia: Topical Ethyl chloride.   With sterile technique and under real time ultrasound guidance: 40 mg  of Kenalog  and 2 mL of Marcaine  injected into glenohumeral joint. Fluid seen entering the joint capsule.   Completed without difficulty   Pain immediately resolved suggesting accurate placement of the medication.   Advised to call if fevers/chills, erythema, induration, drainage, or persistent bleeding.   Images permanently stored and available for review in the ultrasound unit.  Impression: Technically successful ultrasound guided injection.   X-ray images left shoulder and left hip obtained today personally and independently interpreted.  Left shoulder: Severe glenohumeral DJD.  No acute fractures are visible.  Left hip: No acute fractures.  Mild hip DJD.  Await formal radiology review    Assessment and Plan: 78 y.o. female with chronic left shoulder and left lateral hip pain occurring for years after a fall.  Shoulder x-ray originally in 2023 and today shows severe glenohumeral DJD which is her primary pain generator.  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  Plan for glenohumeral injection and physical therapy.  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  Again plan for physical therapy.  She does have mild dementia which ischemic heart should be at or interfere with her ability to do physical therapy correctly.  However talking to her in clinic and her son I think should be able to get at least enough benefit out of PT for it to be worthwhile.  Recheck in 6 weeks.   PDMP not reviewed this encounter. Orders Placed This Encounter  Procedures   DG Shoulder Left  Standing Status:   Future    Expiration Date:   01/27/2024    Reason for Exam (SYMPTOM  OR DIAGNOSIS REQUIRED):   left shoulder pain    Preferred imaging location?:   [facility]   US  LIMITED JOINT SPACE STRUCTURES UP LEFT(NO LINKED CHARGES)    Reason for Exam (SYMPTOM  OR DIAGNOSIS REQUIRED):   left shoulder pain    Preferred  imaging location?:   [facility]   DG HIP UNILAT W OR W/O PELVIS 2-3 VIEWS LEFT    Standing Status:   Future    Expiration Date:   01/27/2024    Reason for Exam (SYMPTOM  OR DIAGNOSIS REQUIRED):   left hip pain    Preferred imaging location?:   [facility]   Ambulatory referral to Physical Therapy    Referral Priority:   Routine    Referral Type:   Physical Medicine    Referral Reason:   Specialty Services Required    Requested Specialty:   Physical Therapy    Number of Visits Requested:   1   No orders of the defined types were placed in this encounter.    Discussed warning signs or symptoms. Please see discharge instructions. Patient expresses understanding.   The above documentation has been reviewed and is accurate and complete Artist Jackie Stevens, M.D.

## 2023-12-27 NOTE — Patient Instructions (Addendum)
Thank you for coming in today.   You received an injection today. Seek immediate medical attention if the joint becomes red, extremely painful, or is oozing fluid.   Please get an Xray today before you leave   I've referred you to Physical Therapy.  Let us know if you don't hear from them in one week.   Check back in 6 weeks

## 2024-01-02 ENCOUNTER — Encounter: Payer: Self-pay | Admitting: Family Medicine

## 2024-01-02 NOTE — Progress Notes (Signed)
Left shoulder x-ray shows severe shoulder arthritis.

## 2024-01-02 NOTE — Progress Notes (Signed)
Left hip x-ray shows just a little bit of arthritis in the hip.

## 2024-01-03 DIAGNOSIS — M25552 Pain in left hip: Secondary | ICD-10-CM | POA: Diagnosis not present

## 2024-01-03 DIAGNOSIS — M25512 Pain in left shoulder: Secondary | ICD-10-CM | POA: Diagnosis not present

## 2024-01-03 DIAGNOSIS — G8929 Other chronic pain: Secondary | ICD-10-CM | POA: Diagnosis not present

## 2024-01-08 ENCOUNTER — Other Ambulatory Visit: Payer: Self-pay | Admitting: Family Medicine

## 2024-01-10 ENCOUNTER — Ambulatory Visit (INDEPENDENT_AMBULATORY_CARE_PROVIDER_SITE_OTHER): Payer: Medicare HMO

## 2024-01-10 VITALS — Ht 61.0 in | Wt 205.0 lb

## 2024-01-10 DIAGNOSIS — Z0000 Encounter for general adult medical examination without abnormal findings: Secondary | ICD-10-CM

## 2024-01-10 DIAGNOSIS — M25512 Pain in left shoulder: Secondary | ICD-10-CM | POA: Diagnosis not present

## 2024-01-10 DIAGNOSIS — G8929 Other chronic pain: Secondary | ICD-10-CM | POA: Diagnosis not present

## 2024-01-10 DIAGNOSIS — M25552 Pain in left hip: Secondary | ICD-10-CM | POA: Diagnosis not present

## 2024-01-10 NOTE — Progress Notes (Signed)
medicare  Subjective:   Jackie Stevens is a 78 y.o. female who presents for Medicare Annual (Subsequent) preventive examination.  Visit Complete: Virtual I connected with  Altamese Dilling on 01/10/24 by a audio enabled telemedicine application and verified that I am speaking with the correct person using two identifiers.  Patient Location: Home  Provider Location: Home Office  I discussed the limitations of evaluation and management by telemedicine. The patient expressed understanding and agreed to proceed.  Vital Signs: Because this visit was a virtual/telehealth visit, some criteria may be missing or patient reported. Any vitals not documented were not able to be obtained and vitals that have been documented are patient reported.    Cardiac Risk Factors include: advanced age (>79men, >74 women);hypertension     Objective:    Today's Vitals   01/10/24 1110  Weight: 205 lb (93 kg)  Height: 5\' 1"  (1.549 m)   Body mass index is 38.73 kg/m.     01/10/2024   11:18 AM 12/20/2023    9:31 AM 01/05/2023    7:56 AM 05/14/2022    5:01 PM 05/14/2022    5:00 PM 05/09/2021    3:05 PM 01/19/2021    1:35 PM  Advanced Directives  Does Patient Have a Medical Advance Directive? Yes Yes Yes  Yes No   Type of Estate agent of [place];Living will Healthcare Power of [employer] of [place];Living will  Healthcare Power of [place];Living will  Healthcare Power of [place];Living will  Does patient want to make changes to medical advance directive? No - Patient declined No - Patient declined No - Patient declined  No - Guardian declined  No - Guardian declined  Copy of Healthcare Power of Attorney in Chart? Yes - validated most recent copy scanned in chart (See row information) No - copy requested Yes - validated most recent copy scanned in chart (See row information) No - copy requested     Would patient like information on creating a medical advance directive?      No  - Patient declined     Current Medications (verified) Outpatient Encounter Medications as of 01/10/2024  Medication Sig   albuterol (VENTOLIN HFA) 108 (90 Base) MCG/ACT inhaler TAKE 2 PUFFS BY MOUTH EVERY 6 HOURS AS NEEDED FOR WHEEZE OR SHORTNESS OF BREATH   amLODipine (NORVASC) 2.5 MG tablet TAKE 1 TABLET BY MOUTH EVERY DAY   Azelastine HCl 137 MCG/SPRAY SOLN PLACE 2 SPRAYS INTO BOTH NOSTRILS 2 (TWO) TIMES DAILY   benzonatate (TESSALON PERLES) 100 MG capsule Take 1 capsule (100 mg total) by mouth 3 (three) times daily as needed for cough. (Patient not taking: Reported on 12/27/2023)   Black Pepper-Turmeric (TURMERIC CURCUMIN) 04-999 MG CAPS 1 tablet daily.   carboxymethylcellulose (REFRESH PLUS) 0.5 % SOLN Place 1 drop into both eyes 3 (three) times daily as needed (dry eyes).   Cholecalciferol-Vitamin C (VITAMIN D3-VITAMIN C) 1000-500 UNIT-MG CAPS daily.   docusate sodium (COLACE) 100 MG capsule Take 100 mg by mouth daily.   donepezil (ARICEPT) 10 MG tablet TAKE 1 TABLET BY MOUTH EVERY DAY   famotidine (PEPCID) 20 MG tablet TAKE 2 TABLETS (40 MG TOTAL) BY MOUTH DAILY.   FLUoxetine (PROZAC) 10 MG tablet TAKE 1 TABLET BY MOUTH EVERY DAY   gabapentin (NEURONTIN) 300 MG capsule TAKE 1 CAPSULE BY MOUTH EVERYDAY AT BEDTIME   hydrochlorothiazide (HYDRODIURIL) 25 MG tablet TAKE 1 TABLET (25 MG TOTAL) BY MOUTH DAILY.   levothyroxine (SYNTHROID) 125 MCG tablet  TAKE 1 TABLET BY MOUTH DAILY BEFORE BREAKFAST.   losartan (COZAAR) 100 MG tablet TAKE 1 TABLET BY MOUTH EVERYDAY AT BEDTIME   memantine (NAMENDA) 10 MG tablet Take 1 tablet (10 mg total) by mouth 2 (two) times daily.   metoprolol tartrate (LOPRESSOR) 100 MG tablet TAKE 1.5 TABLETS BY MOUTH 2 TIMES DAILY.   naproxen (NAPROSYN) 375 MG tablet TAKE 1 TABLET BY MOUTH TWICE A DAY AS NEEDED FOR MODERATE PAIN OR HEADACHE   nitroGLYCERIN (NITROSTAT) 0.4 MG SL tablet PLACE 1 TABLET UNDER THE TONGUE EVERY 5 MINUTES AS NEEDED FOR CHEST PAIN   omeprazole  (PRILOSEC) 40 MG capsule TAKE 1 CAPSULE BY MOUTH TWICE A DAY   ondansetron (ZOFRAN) 4 MG tablet Take 1 tablet (4 mg total) by mouth every 8 (eight) hours as needed for nausea or vomiting.   polyethylene glycol (MIRALAX / GLYCOLAX) 17 g packet Take 17 g by mouth daily.   Psyllium-Calcium (METAMUCIL PLUS CALCIUM) CAPS daily.   rosuvastatin (CRESTOR) 40 MG tablet TAKE 1 TABLET BY MOUTH EVERY DAY   RSV vaccine recomb adjuvanted (AREXVY) 120 MCG/0.5ML injection Inject into the muscle.   temazepam (RESTORIL) 7.5 MG capsule 1 po every other day for 1 week, then every third day for 1 week.   No facility-administered encounter medications on file as of 01/10/2024.    Allergies (verified) Neomycin-bacitracin zn-polymyx, Niacin, Ciprofloxacin, Flagyl [metronidazole], and Neomycin   History: Past Medical History:  Diagnosis Date   Abnormal cervical cytology 10/25/2012   Follows with Dr Maggie Font of Gyn   Allergic rhinitis 11/04/2010   Qualifier: Diagnosis of  By: Maple Hudson MD, Clinton D    Anemia 10/06/2013   Anginal pain    pt has history of CP states had cardiac workup with no specific issues identified    Arthritis    knees; right thumb; shoulders   Arthritis of left shoulder region 09/30/2016   X-ray of the left shoulder on 09/14/2016: Marked degenerative change with significant osteophytic spurring and subchondral sclerosis.  Loss of glenohumeral joint space.  Well-maintained subacromial space.  Injected 09/30/2016 Katrinka Blazing .   Asthma, mild intermittent 04/02/2013   Chicken pox as a child   Colonic diverticular abscess 11/21/2012   Constipation    Decreased hearing    Dermatitis 03/06/2017   Diverticulitis 10/25/2012   pt. reports that a drain was placed - 09/2012     Diverticulitis of rectosigmoid 11/28/2012   Dry mouth    Essential hypertension 11/08/2010   Qualifier: Diagnosis of  By: Maple Hudson MD, Clinton D    Excessive thirst    External hemorrhoid, bleeding    "sometimes" (Dec 29, 2012)    Frequent urination    GERD (gastroesophageal reflux disease)    H/O hiatal hernia    Hand tingling    Heart murmur    History of kidney stones    Hyperglycemia 01/17/2020   Hyperlipidemia    Hypothyroidism    Incontinence    Insomnia    Kidney stones 1970's   "passed on their own" (12/29/2012)   Low back pain 06/09/2014   Measles as a child   Mild neurocognitive disorder 02/05/2020   Obesity 09/11/2017   Obstructive sleep apnea 11/04/2010   NPSG [facility] 07/15/10- AHI 13.5/ hr CPAP 10/APS     Otitis media 01/07/2014   Palpitations    Paresthesia 03/23/2015   Left face   PONV (postoperative nausea and vomiting)    Rheumatoid arteritis    Shortness of breath dyspnea  using stairs   Sinus pain    Swelling of both lower extremities    Thyroid cancer 1980's   Tinnitus    UTI (urinary tract infection) 04/02/2013   Vertigo 08/05/2019   Vitamin D deficiency 02/28/2018   Past Surgical History:  Procedure Laterality Date   CHOLECYSTECTOMY  1990   COLON SURGERY     COLOSTOMY REVISION  12/19/2012   Procedure: COLON RESECTION SIGMOID;  Surgeon: Cherylynn Ridges, MD;  Location: [facility] OR;  Service: General;  Laterality: N/A;   CYSTOSCOPY WITH STENT PLACEMENT  12/19/2012   Procedure: CYSTOSCOPY WITH STENT PLACEMENT;  Surgeon: Lindaann Slough, MD;  Location: [facility] OR;  Service: Urology;  Laterality: N/A;   DILATION AND CURETTAGE OF UTERUS  1960's   "lots of them; had miscarriages" (12/19/2012)   LYSIS OF ADHESION N/A 12/02/2015   Procedure: LAPAROSCOPIC LYSIS OF ADHESION;  Surgeon: Luretha Murphy, MD;  Location: [facility];  Service: General;  Laterality: N/A;   ROBOTIC ASSISTED BILATERAL SALPINGO OOPHERECTOMY Bilateral 12/02/2015   Procedure: XI ROBOTIC ASSISTED BILATERAL SALPINGO OOPHORECTOMY;  Surgeon: Adolphus Birchwood, MD;  Location: [facility];  Service: Gynecology;  Laterality: Bilateral;   SIGMOID RESECTION / RECTOPEXY  12/19/2012   THYROIDECTOMY, PARTIAL  1988   "then did iodine to remove the rest" (12/19/2012)    TONSILLECTOMY  1951?   TOTAL KNEE ARTHROPLASTY Left 01/19/2021   Procedure: TOTAL KNEE ARTHROPLASTY;  Surgeon: Ollen Gross, MD;  Location: [facility];  Service: Orthopedics;  Laterality: Left;    TRANSRECTAL DRAINAGE OF PELVIC ABSCESS  10/27/2012   VAGINAL HYSTERECTOMY  1970's   "still have my ovaries" (12/19/2012)   Family History  Problem Relation Age of Onset   Alzheimer's disease Mother    Heart disease Mother    Depression Mother    Heart disease Father    Pneumonia Father    Hypertension Father    Hyperlipidemia Father    Cancer Father        skin   Stroke Father    Emphysema Brother        marijuana and cigarettes   Alcohol abuse Brother    Hearing loss Brother    Diabetes Maternal Grandmother    Alzheimer's disease Paternal Grandmother    Cancer Paternal Grandmother        lung?- smoker   Hyperlipidemia Paternal Grandmother    Heart attack Paternal Grandfather    Alcohol abuse Paternal Grandfather    Neurofibromatosis Son        schwanomatosis   Neurofibromatosis Son        swanomatosis   Cancer Paternal Aunt    Esophageal cancer Neg Hx    Colon cancer Neg Hx    Stomach cancer Neg Hx    Colon polyps Neg Hx    Social History   Socioeconomic History   Marital status: Widowed    Spouse name: Ethylene Reznick   Number of children: 2   Years of education: 12   Highest education level: Some college, no degree  Occupational History   Occupation: Retired  Tobacco Use   Smoking status: Never    Passive exposure: Never   Smokeless tobacco: Never   Tobacco comments:    Verified by Alain Honey  Vaping Use   Vaping status: Never Used  Substance and Sexual Activity   Alcohol use: No    Alcohol/week: 0.0 standard drinks of alcohol   Drug use: No   Sexual activity: Not Currently  Other Topics Concern  Not on file  Social History Narrative   Married - (husband passed )   Children   Right handed   Some college   Retired   3 sons   Social Drivers of  Corporate investment banker Strain: Low Risk  (01/10/2024)   Overall Financial Resource Strain (CARDIA)    Difficulty of Paying Living Expenses: Not hard at all  Food Insecurity: No Food Insecurity (01/10/2024)   Hunger Vital Sign    Worried About Running Out of Food in the Last Year: Never true    Ran Out of Food in the Last Year: Never true  Transportation Needs: No Transportation Needs (01/10/2024)   PRAPARE - Administrator, Civil Service (Medical): No    Lack of Transportation (Non-Medical): No  Physical Activity: Sufficiently Active (01/10/2024)   Exercise Vital Sign    Days of Exercise per Week: 7 days    Minutes of Exercise per Session: 30 min  Stress: No Stress Concern Present (01/10/2024)   [employer] of Occupational Health - Occupational Stress Questionnaire    Feeling of Stress : Not at all  Social Connections: Moderately Integrated (01/10/2024)   Social Connection and Isolation Panel [NHANES]    Frequency of Communication with Friends and Family: More than three times a week    Frequency of Social Gatherings with Friends and Family: More than three times a week    Attends Religious Services: More than 4 times per year    Active Member of [employer] or Organizations: Yes    Attends Banker Meetings: More than 4 times per year    Marital Status: Widowed    Tobacco Counseling Counseling given: Not Answered Tobacco comments: Verified by Alain Honey   Clinical Intake:  Pre-visit preparation completed: Yes  Pain : No/denies pain     BMI - recorded: 38.73 Nutritional Status: BMI > 30  Obese Nutritional Risks: None Diabetes: No  How often do you need to have someone help you when you read instructions, pamphlets, or other written materials from your doctor or pharmacy?: 1 - Never  Interpreter Needed?: No  Information entered by :: Theresa Mulligan LPN   Activities of Daily Living    01/10/2024   11:16 AM  In your present state of  health, do you have any difficulty performing the following activities:  Hearing? 0  Vision? 0  Difficulty concentrating or making decisions? 0  Walking or climbing stairs? 1  Comment Uses a Walker  Dressing or bathing? 0  Doing errands, shopping? 0  Preparing Food and eating ? N  Using the Toilet? N  In the past six months, have you accidently leaked urine? Y  Comment Wears Breifs at night. Followed by PCP  Do you have problems with loss of bowel control? N  Managing your Medications? N  Managing your Finances? N  Housekeeping or managing your Housekeeping? N    Patient Care Team: Bradd Canary, MD as PCP - General (Family Medicine) Wendall Stade, MD as PCP - Cardiology (Cardiology) Zelphia Cairo, MD as Consulting Physician (Obstetrics and Gynecology) Blima Ledger, OD as Consulting Physician (Optometry) Adrian Prince, MD as Consulting Physician (Endocrinology) Ollen Gross, MD as Consulting Physician (Orthopedic Surgery) Meylor, August Saucer, DC as Consulting Physician (Chiropractic Medicine) Bufford Buttner, MD as Consulting Physician (Dermatology) Rosann Auerbach, PhD as Consulting Physician (Psychology) Karel Jarvis Lesle Chris, MD as Consulting Physician (Neurology)  Indicate any recent Medical Services you may have received from other than [facility] providers  in the past year (date may be approximate).     Assessment:   This is a routine wellness examination for [place].  Hearing/Vision screen Hearing Screening - Comments:: Denies hearing difficulties   Vision Screening - Comments:: Wears rx glasses - up to date with routine eye exams with  [facility]   Goals Addressed               This Visit's Progress     Stay Active (pt-stated)         Depression Screen    01/10/2024   11:15 AM 08/30/2023   11:01 AM 05/24/2023   11:28 AM 03/22/2023   10:43 AM 02/01/2023    3:21 PM 01/20/2023    3:44 PM 01/05/2023    7:55 AM  PHQ 2/9 Scores  PHQ - 2 Score 0 0 0 0 0 0 0  PHQ- 9  Score   0 0       Fall Risk    01/10/2024   11:17 AM 12/20/2023    9:31 AM 08/30/2023   11:01 AM 06/14/2023    9:02 AM 05/24/2023   11:28 AM  Fall Risk   Falls in the past year? 0 0 0 0 0  Number falls in past yr: 0 0 0 0 0  Injury with Fall? 0 0 0 0 0  Risk for fall due to : No Fall Risks      Follow up Falls prevention discussed Falls evaluation completed Falls evaluation completed Falls evaluation completed Falls evaluation completed    MEDICARE RISK AT HOME: Medicare Risk at Home Any stairs in or around the home?: Yes If so, are there any without handrails?: No Home free of loose throw rugs in walkways, pet beds, electrical cords, etc?: Yes Adequate lighting in your home to reduce risk of falls?: Yes Life alert?: Yes Use of a cane, walker or w/c?: Yes Grab bars in the bathroom?: Yes Shower chair or bench in shower?: Yes Elevated toilet seat or a handicapped toilet?: Yes  TIMED UP AND GO:  Was the test performed?  No    Cognitive Function:    12/20/2023   11:00 AM 12/07/2022    9:00 AM 05/03/2022    3:00 PM 05/25/2018    3:25 PM 05/24/2017    3:31 PM  MMSE - Mini Mental State Exam  Orientation to time 0 0 0 5 5  Orientation to Place 0 4 0 5 5  Registration 3 1 3 3 3   Attention/ Calculation 2 3 2 5 5   Recall 0 2 0 2 3  Language- name 2 objects 2 1 2 2 2   Language- repeat 1 1 1 1 1   Language- follow 3 step command 3 2 3 3 3   Language- read & follow direction 1 1 1 1 1   Write a sentence 1 1 0 1 1  Copy design 0 0 0 1 1  Total score 13 16 12 29 30         01/10/2024   11:20 AM 05/29/2020    1:12 PM  6CIT Screen  What Year? 4 points 0 points  What month? 3 points 0 points  What time? 0 points 0 points  Count back from 20 0 points 0 points  Months in reverse 4 points 0 points  Repeat phrase 10 points 4 points  Total Score 21 points 4 points    Immunizations Immunization History  Administered Date(s) Administered   Fluad Quad(high Dose 65+) 09/15/2021,  08/31/2022  Fluad Trivalent(High Dose 65+) 08/30/2023   Influenza Split 09/12/2012, 10/02/2015   Influenza, High Dose Seasonal PF 09/14/2016, 09/05/2017, 09/01/2018, 07/25/2019, 08/01/2020, 08/22/2020   Influenza,inj,Quad PF,6+ Mos 08/30/2013, 10/21/2014, 08/20/2015   Influenza,inj,quad, With Preservative 09/12/2017, 08/25/2020   Influenza-Unspecified 08/01/2019, 08/01/2020   Moderna SARS-COV2 Booster Vaccination 10/20/2020   Moderna Sars-Covid-2 Vaccination 01/19/2020, 02/11/2020, 03/12/2020   PFIZER(Purple Top)SARS-COV-2 Vaccination 02/20/2020   Pfizer(Comirnaty)Fall Seasonal Vaccine 12 years and older 12/13/2023   Pneumococcal Conjugate-13 07/19/2011, 11/01/2014   Pneumococcal Polysaccharide-23 03/30/2016   Respiratory Syncytial Virus Vaccine,Recomb Aduvanted(Arexvy) 12/13/2023   Tdap 10/02/2013   Zoster Recombinant(Shingrix) 01/31/2019, 06/17/2019   Zoster, Live 12/09/2008    TDAP status: Due, Education has been provided regarding the importance of this vaccine. Advised may receive this vaccine at local pharmacy or Health Dept. Aware to provide a copy of the vaccination record if obtained from local pharmacy or Health Dept. Verbalized acceptance and understanding.  Flu Vaccine status: Up to date  Pneumococcal vaccine status: Up to date    Qualifies for Shingles Vaccine? Yes   Zostavax completed Yes   Shingrix Completed?: Yes  Screening Tests Health Maintenance  Topic Date Due   Hepatitis C Screening  Never done   DTaP/Tdap/Td (2 - Td or Tdap) 10/03/2023   COVID-19 Vaccine (6 - 2024-25 season) 02/07/2024   Medicare Annual Wellness (AWV)  01/09/2025   Pneumonia Vaccine 64+ Years old  Completed   INFLUENZA VACCINE  Completed   DEXA SCAN  Completed   Zoster Vaccines- Shingrix  Completed   HPV VACCINES  Aged Out   Colonoscopy  Discontinued    Health Maintenance  Health Maintenance Due  Topic Date Due   Hepatitis C Screening  Never done   DTaP/Tdap/Td (2 - Td or  Tdap) 10/03/2023    Bone Density status: Completed 08/30/17. Results reflect: Bone density results: NORMAL. Repeat every   years.    Additional Screening:  Hepatitis C Screening: does qualify;  Deferred  Vision Screening: Recommended annual ophthalmology exams for early detection of glaucoma and other disorders of the eye. Is the patient up to date with their annual eye exam?  Yes  Who is the provider or what is the name of the office in which the patient attends annual eye exams? [facility] If pt is not established with a provider, would they like to be referred to a provider to establish care? No .   Dental Screening: Recommended annual dental exams for proper oral hygiene    Community Resource Referral / Chronic Care Management:  CRR required this visit?  No   CCM required this visit?  No     Plan:     I have personally reviewed and noted the following in the patient's chart:   Medical and social history Use of alcohol, tobacco or illicit drugs  Current medications and supplements including opioid prescriptions. Patient is not currently taking opioid prescriptions. Functional ability and status Nutritional status Physical activity Advanced directives List of other physicians Hospitalizations, surgeries, and ER visits in previous 12 months Vitals Screenings to include cognitive, depression, and falls Referrals and appointments  In addition, I have reviewed and discussed with patient certain preventive protocols, quality metrics, and best practice recommendations. A written personalized care plan for preventive services as well as general preventive health recommendations were provided to patient.     [name], LPN   07/23/9146   After Visit Summary: (MyChart) Due to this being a telephonic visit, the after visit summary with patients personalized  plan was offered to patient via MyChart   Nurse Notes: None

## 2024-01-10 NOTE — Patient Instructions (Addendum)
Jackie Stevens , Thank you for taking time to come for your Medicare Wellness Visit. I appreciate your ongoing commitment to your health goals. Please review the following plan we discussed and let me know if I can assist you in the future.   Referrals/Orders/Follow-Ups/Clinician Recommendations:   This is a list of the screening recommended for you and due dates:  Health Maintenance  Topic Date Due   Hepatitis C Screening  Never done   DTaP/Tdap/Td vaccine (2 - Td or Tdap) 10/03/2023   COVID-19 Vaccine (6 - 2024-25 season) 02/07/2024   Medicare Annual Wellness Visit  01/09/2025   Pneumonia Vaccine  Completed   Flu Shot  Completed   DEXA scan (bone density measurement)  Completed   Zoster (Shingles) Vaccine  Completed   HPV Vaccine  Aged Out   Colon Cancer Screening  Discontinued    Advanced directives: (In Chart) A copy of your advanced directives are scanned into your chart should your provider ever need it.  Next Medicare Annual Wellness Visit scheduled for next year: Yes

## 2024-01-13 DIAGNOSIS — G8929 Other chronic pain: Secondary | ICD-10-CM | POA: Diagnosis not present

## 2024-01-13 DIAGNOSIS — M25552 Pain in left hip: Secondary | ICD-10-CM | POA: Diagnosis not present

## 2024-01-13 DIAGNOSIS — M25512 Pain in left shoulder: Secondary | ICD-10-CM | POA: Diagnosis not present

## 2024-01-17 DIAGNOSIS — M25512 Pain in left shoulder: Secondary | ICD-10-CM | POA: Diagnosis not present

## 2024-01-17 DIAGNOSIS — G8929 Other chronic pain: Secondary | ICD-10-CM | POA: Diagnosis not present

## 2024-01-17 DIAGNOSIS — M25552 Pain in left hip: Secondary | ICD-10-CM | POA: Diagnosis not present

## 2024-01-20 DIAGNOSIS — M25552 Pain in left hip: Secondary | ICD-10-CM | POA: Diagnosis not present

## 2024-01-20 DIAGNOSIS — G8929 Other chronic pain: Secondary | ICD-10-CM | POA: Diagnosis not present

## 2024-01-20 DIAGNOSIS — M25512 Pain in left shoulder: Secondary | ICD-10-CM | POA: Diagnosis not present

## 2024-01-24 DIAGNOSIS — M25552 Pain in left hip: Secondary | ICD-10-CM | POA: Diagnosis not present

## 2024-01-24 DIAGNOSIS — M25512 Pain in left shoulder: Secondary | ICD-10-CM | POA: Diagnosis not present

## 2024-01-24 DIAGNOSIS — G8929 Other chronic pain: Secondary | ICD-10-CM | POA: Diagnosis not present

## 2024-01-27 DIAGNOSIS — M25552 Pain in left hip: Secondary | ICD-10-CM | POA: Diagnosis not present

## 2024-01-27 DIAGNOSIS — G8929 Other chronic pain: Secondary | ICD-10-CM | POA: Diagnosis not present

## 2024-01-27 DIAGNOSIS — M25512 Pain in left shoulder: Secondary | ICD-10-CM | POA: Diagnosis not present

## 2024-02-03 DIAGNOSIS — M25512 Pain in left shoulder: Secondary | ICD-10-CM | POA: Diagnosis not present

## 2024-02-03 DIAGNOSIS — G8929 Other chronic pain: Secondary | ICD-10-CM | POA: Diagnosis not present

## 2024-02-03 DIAGNOSIS — M25552 Pain in left hip: Secondary | ICD-10-CM | POA: Diagnosis not present

## 2024-02-06 NOTE — Progress Notes (Unsigned)
   Rubin Payor, PhD, LAT, ATC acting as a scribe for Jackie Graham, MD.  Jackie Stevens is a 78 y.o. female who presents to [facility] today for f/u L hip and L shoulder pain. Pt was last seen by Dr. Denyse Amass on 12/27/23 and was given a L GH steroid injection and was referred to [facility].  Today, pt reports ***  Dx imaging: 12/27/23 L hip & L shoulder XR  05/27/22 L hip & L shoulder XR  Pertinent review of systems: ***  Relevant historical information: ***   Exam:  There were no vitals taken for this visit. General: Well Developed, well nourished, and in no acute distress.   MSK: ***    Lab and Radiology Results No results found for this or any previous visit (from the past 72 hours). No results found.     Assessment and Plan: 78 y.o. female with ***   PDMP not reviewed this encounter. No orders of the defined types were placed in this encounter.  No orders of the defined types were placed in this encounter.    Discussed warning signs or symptoms. Please see discharge instructions. Patient expresses understanding.   ***

## 2024-02-07 ENCOUNTER — Ambulatory Visit: Payer: Medicare HMO | Admitting: Family Medicine

## 2024-02-07 ENCOUNTER — Encounter: Payer: Self-pay | Admitting: Family Medicine

## 2024-02-07 VITALS — BP 132/84 | HR 59 | Ht 61.0 in | Wt 200.0 lb

## 2024-02-07 DIAGNOSIS — M25552 Pain in left hip: Secondary | ICD-10-CM | POA: Diagnosis not present

## 2024-02-07 DIAGNOSIS — G8929 Other chronic pain: Secondary | ICD-10-CM

## 2024-02-07 DIAGNOSIS — M25512 Pain in left shoulder: Secondary | ICD-10-CM | POA: Diagnosis not present

## 2024-02-07 NOTE — Patient Instructions (Addendum)
 Thank you for coming in today.  See you back around April 7th for a shoulder injection.

## 2024-02-08 ENCOUNTER — Other Ambulatory Visit: Payer: Self-pay | Admitting: Family Medicine

## 2024-02-08 DIAGNOSIS — E038 Other specified hypothyroidism: Secondary | ICD-10-CM

## 2024-02-10 DIAGNOSIS — M25512 Pain in left shoulder: Secondary | ICD-10-CM | POA: Diagnosis not present

## 2024-02-10 DIAGNOSIS — M25552 Pain in left hip: Secondary | ICD-10-CM | POA: Diagnosis not present

## 2024-02-10 DIAGNOSIS — G8929 Other chronic pain: Secondary | ICD-10-CM | POA: Diagnosis not present

## 2024-02-11 ENCOUNTER — Other Ambulatory Visit: Payer: Self-pay | Admitting: Family Medicine

## 2024-02-12 NOTE — Assessment & Plan Note (Signed)
Encouraged good sleep hygiene such as dark, quiet room. No blue/green glowing lights such as computer screens in bedroom. No alcohol or stimulants in evening. Cut down on caffeine as able. Regular exercise is helpful but not just prior to bed time. Tolerating Temazepam 

## 2024-02-12 NOTE — Assessment & Plan Note (Signed)
 Stable and doing well at home with her son

## 2024-02-12 NOTE — Assessment & Plan Note (Signed)
 On Levothyroxine, continue to monitor

## 2024-02-12 NOTE — Assessment & Plan Note (Signed)
 Supplement and monitor

## 2024-02-12 NOTE — Assessment & Plan Note (Signed)
 [id number] acceptable, minimize simple carbs. Increase exercise as tolerated.

## 2024-02-12 NOTE — Assessment & Plan Note (Signed)
 Well controlled, no changes to meds. Encouraged heart healthy diet such as the DASH diet and exercise as tolerated.

## 2024-02-12 NOTE — Assessment & Plan Note (Signed)
 She is managing her symptoms and ADLs well at home under the supervision of her son

## 2024-02-13 ENCOUNTER — Encounter: Payer: Self-pay | Admitting: Family Medicine

## 2024-02-13 ENCOUNTER — Ambulatory Visit (INDEPENDENT_AMBULATORY_CARE_PROVIDER_SITE_OTHER): Payer: Medicare HMO | Admitting: Family Medicine

## 2024-02-13 ENCOUNTER — Other Ambulatory Visit: Payer: Self-pay | Admitting: Physician Assistant

## 2024-02-13 VITALS — BP 128/80 | HR 64 | Temp 97.4°F | Resp 16 | Ht 64.0 in | Wt 204.4 lb

## 2024-02-13 DIAGNOSIS — E538 Deficiency of other specified B group vitamins: Secondary | ICD-10-CM

## 2024-02-13 DIAGNOSIS — I10 Essential (primary) hypertension: Secondary | ICD-10-CM

## 2024-02-13 DIAGNOSIS — R739 Hyperglycemia, unspecified: Secondary | ICD-10-CM

## 2024-02-13 DIAGNOSIS — F5101 Primary insomnia: Secondary | ICD-10-CM | POA: Diagnosis not present

## 2024-02-13 DIAGNOSIS — F0394 Unspecified dementia, unspecified severity, with anxiety: Secondary | ICD-10-CM | POA: Diagnosis not present

## 2024-02-13 DIAGNOSIS — D649 Anemia, unspecified: Secondary | ICD-10-CM | POA: Diagnosis not present

## 2024-02-13 DIAGNOSIS — E782 Mixed hyperlipidemia: Secondary | ICD-10-CM | POA: Diagnosis not present

## 2024-02-13 DIAGNOSIS — R413 Other amnesia: Secondary | ICD-10-CM

## 2024-02-13 DIAGNOSIS — E559 Vitamin D deficiency, unspecified: Secondary | ICD-10-CM | POA: Diagnosis not present

## 2024-02-13 DIAGNOSIS — E038 Other specified hypothyroidism: Secondary | ICD-10-CM | POA: Diagnosis not present

## 2024-02-13 LAB — CBC WITH DIFFERENTIAL/PLATELET
Basophils Absolute: 0 10*3/uL (ref 0.0–0.1)
Basophils Relative: 0.6 % (ref 0.0–3.0)
Eosinophils Absolute: 0.1 10*3/uL (ref 0.0–0.7)
Eosinophils Relative: 1.2 % (ref 0.0–5.0)
HCT: 38.1 % (ref 36.0–46.0)
Hemoglobin: 13.1 g/dL (ref 12.0–15.0)
Lymphocytes Relative: 16.2 % (ref 12.0–46.0)
Lymphs Abs: 1.3 10*3/uL (ref 0.7–4.0)
MCHC: 34.4 g/dL (ref 30.0–36.0)
MCV: 94.9 fl (ref 78.0–100.0)
Monocytes Absolute: 0.7 10*3/uL (ref 0.1–1.0)
Monocytes Relative: 9 % (ref 3.0–12.0)
Neutro Abs: 5.7 10*3/uL (ref 1.4–7.7)
Neutrophils Relative %: 73 % (ref 43.0–77.0)
Platelets: 273 10*3/uL (ref 150.0–400.0)
RBC: 4.02 Mil/uL (ref 3.87–5.11)
RDW: 13.5 % (ref 11.5–15.5)
WBC: 7.8 10*3/uL (ref 4.0–10.5)

## 2024-02-13 LAB — TSH: TSH: 12.64 u[IU]/mL — ABNORMAL HIGH (ref 0.35–5.50)

## 2024-02-13 LAB — LIPID PANEL
Cholesterol: 133 mg/dL (ref 0–200)
HDL: 60.5 mg/dL (ref >39.00)
LDL Cholesterol: 42 mg/dL (ref 0–99)
NonHDL: 72.91
Total CHOL/HDL Ratio: 2
Triglycerides: 155 mg/dL — ABNORMAL HIGH (ref 0.0–149.0)
VLDL: 31 mg/dL (ref 0.0–40.0)

## 2024-02-13 LAB — COMPREHENSIVE METABOLIC PANEL
ALT: 19 U/L (ref 0–35)
AST: 18 U/L (ref 0–37)
Albumin: 4.2 g/dL (ref 3.5–5.2)
Alkaline Phosphatase: 82 U/L (ref 39–117)
BUN: 17 mg/dL (ref 6–23)
CO2: 32 meq/L (ref 19–32)
Calcium: 8.8 mg/dL (ref 8.4–10.5)
Chloride: 93 meq/L — ABNORMAL LOW (ref 96–112)
Creatinine, Ser: 0.69 mg/dL (ref 0.40–1.20)
GFR: 82.49 mL/min (ref >60.00)
Glucose, Bld: 108 mg/dL — ABNORMAL HIGH (ref 70–99)
Potassium: 4.1 meq/L (ref 3.5–5.1)
Sodium: 133 meq/L — ABNORMAL LOW (ref 135–145)
Total Bilirubin: 0.5 mg/dL (ref 0.2–1.2)
Total Protein: 6.4 g/dL (ref 6.0–8.3)

## 2024-02-13 LAB — HEMOGLOBIN A1C: Hgb A1c MFr Bld: 5.5 % (ref 4.6–6.5)

## 2024-02-13 LAB — VITAMIN D 25 HYDROXY (VIT D DEFICIENCY, FRACTURES): VITD: 53.38 ng/mL (ref 30.00–100.00)

## 2024-02-13 NOTE — Progress Notes (Signed)
 Subjective:    Patient ID: Jackie Stevens, female    DOB: Apr 22, 1946, 78 y.o.   MRN: [id number]  Chief Complaint  Patient presents with   Follow-up    2 month    HPI Discussed the use of AI scribe software for clinical note transcription with the patient, who gave verbal consent to proceed.  History of Present Illness Jackie Stevens is a 78 year old female with degenerative arthritis who presents with shoulder and hip pain. She is accompanied by her son.  She experiences persistent pain in her left shoulder and hip, which has been a long-standing issue due to degenerative arthritis. Despite various topical treatments such as heat, ice, Bengay, and a CBD cream with menthol, her shoulder pain persists and is a significant source of discomfort.  She has been attending physical therapy twice a week, but the sports medicine doctor advised discontinuing therapy for her shoulder due to increased pain. The focus remains on strengthening her hips and knees to reduce the risk of falls.  She monitors her blood pressure at home, with readings typically ranging from 130s to 150s systolic. Her son reports that she does not have issues with sleep due to pain and does not take any medication for sleep. She maintains a routine of three meals a day and attends a social program at [employer] four days a week, which she enjoys and finds beneficial.  No new symptoms such as blood in the stool or significant changes in her health status.    Past Medical History:  Diagnosis Date   Abnormal cervical cytology 10/25/2012   Follows with Dr Maggie Font of Gyn   Allergic rhinitis 11/04/2010   Qualifier: Diagnosis of  By: Maple Hudson MD, Clinton D    Anemia 10/06/2013   Anginal pain    pt has history of CP states had cardiac workup with no specific issues identified    Arthritis    knees; right thumb; shoulders   Arthritis of left shoulder region 09/30/2016   X-ray of the left shoulder on 09/14/2016: Marked  degenerative change with significant osteophytic spurring and subchondral sclerosis.  Loss of glenohumeral joint space.  Well-maintained subacromial space.  Injected 09/30/2016 Katrinka Blazing .   Asthma, mild intermittent 04/02/2013   Chicken pox as a child   Colonic diverticular abscess 11/21/2012   Constipation    Decreased hearing    Dermatitis 03/06/2017   Diverticulitis 10/25/2012   pt. reports that a drain was placed - 09/2012     Diverticulitis of rectosigmoid 11/28/2012   Dry mouth    Essential hypertension 11/08/2010   Qualifier: Diagnosis of  By: Maple Hudson MD, Clinton D    Excessive thirst    External hemorrhoid, bleeding    "sometimes" (01-03-13)   Frequent urination    GERD (gastroesophageal reflux disease)    H/O hiatal hernia    Hand tingling    Heart murmur    History of kidney stones    Hyperglycemia 01/17/2020   Hyperlipidemia    Hypothyroidism    Incontinence    Insomnia    Kidney stones 1970's   "passed on their own" (Jan 03, 2013)   Low back pain 06/09/2014   Measles as a child   Mild neurocognitive disorder 02/05/2020   Obesity 09/11/2017   Obstructive sleep apnea 11/04/2010   NPSG [facility] 07/15/10- AHI 13.5/ hr CPAP 10/APS     Otitis media 01/07/2014   Palpitations    Paresthesia 03/23/2015   Left face   PONV (  postoperative nausea and vomiting)    Rheumatoid arteritis    Shortness of breath dyspnea    using stairs   Sinus pain    Swelling of both lower extremities    Thyroid cancer 1980's   Tinnitus    UTI (urinary tract infection) 04/02/2013   Vertigo 08/05/2019   Vitamin D deficiency 02/28/2018    Past Surgical History:  Procedure Laterality Date   CHOLECYSTECTOMY  1990   COLON SURGERY     COLOSTOMY REVISION  12/19/2012   Procedure: COLON RESECTION SIGMOID;  Surgeon: Cherylynn Ridges, MD;  Location: [facility];  Service: General;  Laterality: N/A;   CYSTOSCOPY WITH STENT PLACEMENT  12/19/2012   Procedure: CYSTOSCOPY WITH STENT PLACEMENT;  Surgeon: Lindaann Slough, MD;   Location: [facility];  Service: Urology;  Laterality: N/A;   DILATION AND CURETTAGE OF UTERUS  1960's   "lots of them; had miscarriages" (12/19/2012)   LYSIS OF ADHESION N/A 12/02/2015   Procedure: LAPAROSCOPIC LYSIS OF ADHESION;  Surgeon: Luretha Murphy, MD;  Location: [facility];  Service: General;  Laterality: N/A;   ROBOTIC ASSISTED BILATERAL SALPINGO OOPHERECTOMY Bilateral 12/02/2015   Procedure: XI ROBOTIC ASSISTED BILATERAL SALPINGO OOPHORECTOMY;  Surgeon: Adolphus Birchwood, MD;  Location: [facility];  Service: Gynecology;  Laterality: Bilateral;   SIGMOID RESECTION / RECTOPEXY  12/19/2012   THYROIDECTOMY, PARTIAL  1988   "then did iodine to remove the rest" (12/19/2012)   TONSILLECTOMY  1951?   TOTAL KNEE ARTHROPLASTY Left 01/19/2021   Procedure: TOTAL KNEE ARTHROPLASTY;  Surgeon: Ollen Gross, MD;  Location: [facility];  Service: Orthopedics;  Laterality: Left;    TRANSRECTAL DRAINAGE OF PELVIC ABSCESS  10/27/2012   VAGINAL HYSTERECTOMY  1970's   "still have my ovaries" (12/19/2012)    Family History  Problem Relation Age of Onset   Alzheimer's disease Mother    Heart disease Mother    Depression Mother    Heart disease Father    Pneumonia Father    Hypertension Father    Hyperlipidemia Father    Cancer Father        skin   Stroke Father    Emphysema Brother        marijuana and cigarettes   Alcohol abuse Brother    Hearing loss Brother    Diabetes Maternal Grandmother    Alzheimer's disease Paternal Grandmother    Cancer Paternal Grandmother        lung?- smoker   Hyperlipidemia Paternal Grandmother    Heart attack Paternal Grandfather    Alcohol abuse Paternal Grandfather    Neurofibromatosis Son        schwanomatosis   Neurofibromatosis Son        swanomatosis   Cancer Paternal Aunt    Esophageal cancer Neg Hx    Colon cancer Neg Hx    Stomach cancer Neg Hx    Colon polyps Neg Hx     Social History   Socioeconomic History   Marital status: Widowed    Spouse name: Brelynn Wheller    Number of children: 2   Years of education: 12   Highest education level: Some college, no degree  Occupational History   Occupation: Retired  Tobacco Use   Smoking status: Never    Passive exposure: Never   Smokeless tobacco: Never   Tobacco comments:    Verified by Alain Honey  Vaping Use   Vaping status: Never Used  Substance and Sexual Activity   Alcohol use: No  Alcohol/week: 0.0 standard drinks of alcohol   Drug use: No   Sexual activity: Not Currently  Other Topics Concern   Not on file  Social History Narrative   Married - (husband passed )   Children   Right handed   Some college   Retired   3 sons   Social Drivers of Corporate investment banker Strain: Low Risk  (01/10/2024)   Overall Financial Resource Strain (CARDIA)    Difficulty of Paying Living Expenses: Not hard at all  Food Insecurity: No Food Insecurity (01/10/2024)   Hunger Vital Sign    Worried About Running Out of Food in the Last Year: Never true    Ran Out of Food in the Last Year: Never true  Transportation Needs: No Transportation Needs (01/10/2024)   PRAPARE - Administrator, Civil Service (Medical): No    Lack of Transportation (Non-Medical): No  Physical Activity: Sufficiently Active (01/10/2024)   Exercise Vital Sign    Days of Exercise per Week: 7 days    Minutes of Exercise per Session: 30 min  Stress: No Stress Concern Present (01/10/2024)   [employer] of Occupational Health - Occupational Stress Questionnaire    Feeling of Stress : Not at all  Social Connections: Moderately Integrated (01/10/2024)   Social Connection and Isolation Panel [NHANES]    Frequency of Communication with Friends and Family: More than three times a week    Frequency of Social Gatherings with Friends and Family: More than three times a week    Attends Religious Services: More than 4 times per year    Active Member of [employer] or Organizations: Yes    Attends Banker Meetings:  More than 4 times per year    Marital Status: Widowed  Intimate Partner Violence: Not At Risk (01/10/2024)   Humiliation, Afraid, Rape, and Kick questionnaire    Fear of Current or Ex-Partner: No    Emotionally Abused: No    Physically Abused: No    Sexually Abused: No    Outpatient Medications Prior to Visit  Medication Sig Dispense Refill   albuterol (VENTOLIN HFA) 108 (90 Base) MCG/ACT inhaler TAKE 2 PUFFS BY MOUTH EVERY 6 HOURS AS NEEDED FOR WHEEZE OR SHORTNESS OF BREATH 18 each 2   amLODipine (NORVASC) 2.5 MG tablet TAKE 1 TABLET BY MOUTH EVERY DAY 90 tablet 1   benzonatate (TESSALON PERLES) 100 MG capsule Take 1 capsule (100 mg total) by mouth 3 (three) times daily as needed for cough. 30 capsule 2   Black Pepper-Turmeric (TURMERIC CURCUMIN) 04-999 MG CAPS 1 tablet daily.     carboxymethylcellulose (REFRESH PLUS) 0.5 % SOLN Place 1 drop into both eyes 3 (three) times daily as needed (dry eyes).     Cholecalciferol-Vitamin C (VITAMIN D3-VITAMIN C) 1000-500 UNIT-MG CAPS daily.     docusate sodium (COLACE) 100 MG capsule Take 100 mg by mouth daily.     donepezil (ARICEPT) 10 MG tablet TAKE 1 TABLET BY MOUTH EVERY DAY 90 tablet 3   famotidine (PEPCID) 20 MG tablet TAKE 2 TABLETS (40 MG TOTAL) BY MOUTH DAILY. 180 tablet 1   FLUoxetine (PROZAC) 10 MG tablet TAKE 1 TABLET BY MOUTH EVERY DAY 90 tablet 1   hydrochlorothiazide (HYDRODIURIL) 25 MG tablet TAKE 1 TABLET (25 MG TOTAL) BY MOUTH DAILY. 90 tablet 1   levothyroxine (SYNTHROID) 125 MCG tablet TAKE 1 TABLET BY MOUTH EVERY DAY BEFORE BREAKFAST 90 tablet 1   losartan (COZAAR) 100 MG tablet  TAKE 1 TABLET BY MOUTH EVERYDAY AT BEDTIME 90 tablet 1   memantine (NAMENDA) 10 MG tablet Take 1 tablet (10 mg total) by mouth 2 (two) times daily. 180 tablet 2   metoprolol tartrate (LOPRESSOR) 100 MG tablet TAKE 1.5 TABLETS BY MOUTH 2 TIMES DAILY. 270 tablet 1   naproxen (NAPROSYN) 375 MG tablet TAKE 1 TABLET BY MOUTH TWICE A DAY AS NEEDED FOR MODERATE  PAIN OR HEADACHE 60 tablet 3   nitroGLYCERIN (NITROSTAT) 0.4 MG SL tablet PLACE 1 TABLET UNDER THE TONGUE EVERY 5 MINUTES AS NEEDED FOR CHEST PAIN 25 tablet 1   omeprazole (PRILOSEC) 40 MG capsule TAKE 1 CAPSULE BY MOUTH TWICE A DAY 180 capsule 1   ondansetron (ZOFRAN) 4 MG tablet Take 1 tablet (4 mg total) by mouth every 8 (eight) hours as needed for nausea or vomiting. 30 tablet 1   polyethylene glycol (MIRALAX / GLYCOLAX) 17 g packet Take 17 g by mouth daily.     Psyllium-Calcium (METAMUCIL PLUS CALCIUM) CAPS daily.     rosuvastatin (CRESTOR) 40 MG tablet TAKE 1 TABLET BY MOUTH EVERY DAY 90 tablet 1   Azelastine HCl 137 MCG/SPRAY SOLN PLACE 2 SPRAYS INTO BOTH NOSTRILS 2 (TWO) TIMES DAILY 30 mL 1   gabapentin (NEURONTIN) 300 MG capsule TAKE 1 CAPSULE BY MOUTH EVERYDAY AT BEDTIME 30 capsule 1   RSV vaccine recomb adjuvanted (AREXVY) 120 MCG/0.5ML injection Inject into the muscle. 0.5 mL 0   temazepam (RESTORIL) 7.5 MG capsule 1 po every other day for 1 week, then every third day for 1 week. 7 capsule 0   No facility-administered medications prior to visit.    Allergies  Allergen Reactions   Neomycin-Bacitracin Zn-Polymyx Rash    Polysporin- is tolerated    Niacin Other (See Comments) and Cough    "cough til I threw up" (12/19/2012)   Ciprofloxacin Hives    Got cipro and flagyl at same time, localized hives to IV arm   Flagyl [Metronidazole] Hives    Got cipro and flagyl at same time, localized hives to IV arm   Neomycin Other (See Comments)    Review of Systems  Constitutional:  Negative for fever and malaise/fatigue.  HENT:  Negative for congestion.   Eyes:  Negative for blurred vision.  Respiratory:  Negative for shortness of breath.   Cardiovascular:  Negative for chest pain, palpitations and leg swelling.  Gastrointestinal:  Negative for abdominal pain, blood in stool and nausea.  Genitourinary:  Negative for dysuria and frequency.  Musculoskeletal:  Positive for back pain,  joint pain and myalgias. Negative for falls.  Skin:  Negative for rash.  Neurological:  Negative for dizziness, loss of consciousness and headaches.  Endo/Heme/Allergies:  Negative for environmental allergies.  Psychiatric/Behavioral:  Positive for memory loss. Negative for depression. The patient is not nervous/anxious.        Objective:    Physical Exam Constitutional:      General: She is not in acute distress.    Appearance: Normal appearance. She is well-developed. She is not toxic-appearing.  HENT:     Head: Normocephalic and atraumatic.     Right Ear: External ear normal.     Left Ear: External ear normal.     Nose: Nose normal.  Eyes:     General:        Right eye: No discharge.        Left eye: No discharge.     Conjunctiva/sclera: Conjunctivae normal.  Neck:  Thyroid: No thyromegaly.  Cardiovascular:     Rate and Rhythm: Normal rate and regular rhythm.     Heart sounds: Normal heart sounds. No murmur heard. Pulmonary:     Effort: Pulmonary effort is normal. No respiratory distress.     Breath sounds: Normal breath sounds.  Abdominal:     General: Bowel sounds are normal.     Palpations: Abdomen is soft.     Tenderness: There is no abdominal tenderness. There is no guarding.  Musculoskeletal:        General: Normal range of motion.     Cervical back: Neck supple.  Lymphadenopathy:     Cervical: No cervical adenopathy.  Skin:    General: Skin is warm and dry.  Neurological:     Mental Status: She is alert and oriented to person, place, and time.  Psychiatric:        Mood and Affect: Mood normal.        Behavior: Behavior normal.        Thought Content: Thought content normal.        Judgment: Judgment normal.     BP 128/80   Pulse 64   Temp (!) 97.4 F (36.3 C) (Oral)   Resp 16   Ht 5\' 4"  (1.626 m)   Wt 204 lb 6.4 oz (92.7 kg)   SpO2 98%   BMI 35.09 kg/m  Wt Readings from Last 3 Encounters:  02/13/24 204 lb 6.4 oz (92.7 kg)  02/07/24 200 lb  (90.7 kg)  01/10/24 205 lb (93 kg)    Diabetic Foot Exam - Simple   No data filed    Lab Results  Component Value Date   WBC 7.9 10/18/2023   HGB 12.8 10/18/2023   HCT 38.1 10/18/2023   PLT 274.0 10/18/2023   GLUCOSE 91 08/30/2023   CHOL 138 08/30/2023   TRIG 119.0 08/30/2023   HDL 66.40 08/30/2023   LDLDIRECT 81.0 02/12/2020   LDLCALC 48 08/30/2023   ALT 18 08/30/2023   AST 19 08/30/2023   NA 133 (L) 08/30/2023   K 4.4 08/30/2023   CL 96 08/30/2023   CREATININE 0.69 08/30/2023   BUN 16 08/30/2023   CO2 30 08/30/2023   TSH 2.37 08/30/2023   INR 1.0 01/12/2021   HGBA1C 5.5 08/30/2023    Lab Results  Component Value Date   TSH 2.37 08/30/2023   Lab Results  Component Value Date   WBC 7.9 10/18/2023   HGB 12.8 10/18/2023   HCT 38.1 10/18/2023   MCV 93.7 10/18/2023   PLT 274.0 10/18/2023   Lab Results  Component Value Date   NA 133 (L) 08/30/2023   K 4.4 08/30/2023   CO2 30 08/30/2023   GLUCOSE 91 08/30/2023   BUN 16 08/30/2023   CREATININE 0.69 08/30/2023   BILITOT 0.5 08/30/2023   ALKPHOS 82 08/30/2023   AST 19 08/30/2023   ALT 18 08/30/2023   PROT 6.2 08/30/2023   ALBUMIN 4.0 08/30/2023   CALCIUM 8.5 08/30/2023   ANIONGAP 8 01/20/2021   GFR 82.76 08/30/2023   Lab Results  Component Value Date   CHOL 138 08/30/2023   Lab Results  Component Value Date   HDL 66.40 08/30/2023   Lab Results  Component Value Date   LDLCALC 48 08/30/2023   Lab Results  Component Value Date   TRIG 119.0 08/30/2023   Lab Results  Component Value Date   CHOLHDL 2 08/30/2023   Lab Results  Component Value Date  HGBA1C 5.5 08/30/2023       Assessment & Plan:  Essential hypertension Assessment & Plan: Well controlled, no changes to meds. Encouraged heart healthy diet such as the DASH diet and exercise as tolerated.    Orders: -     Comprehensive metabolic panel -     CBC with Differential/Platelet -     TSH  Hyperglycemia Assessment &  Plan: hgba1c acceptable, minimize simple carbs. Increase exercise as tolerated.   Orders: -     Hemoglobin A1c  Other specified hypothyroidism Assessment & Plan: On Levothyroxine, continue to monitor   Primary insomnia Assessment & Plan: Encouraged good sleep hygiene such as dark, quiet room. No blue/green glowing lights such as computer screens in bedroom. No alcohol or stimulants in evening. Cut down on caffeine as able. Regular exercise is helpful but not just prior to bed time. Doing well at this time   Memory impairment Assessment & Plan: Stable and doing well at home with her son   Vitamin B12 deficiency Assessment & Plan: Supplement and monitor   Orders: -     Vitamin B12; Future  Vitamin D deficiency Assessment & Plan: Supplement and monitor   Orders: -     VITAMIN D 25 Hydroxy (Vit-D Deficiency, Fractures)  Dementia with anxiety, unspecified dementia severity, unspecified dementia type (HCC) Assessment & Plan: She is managing her symptoms and ADLs well at home under the supervision of her son   Anemia, unspecified type -     Iron, TIBC and Ferritin Panel  Hyperlipidemia, mixed -     Lipid panel    Assessment and Plan Assessment & Plan Degenerative Arthritis (Shoulder and Hip) Chronic pain, particularly in the shoulder. Physical therapy causing more discomfort than benefit. Topical treatments providing limited relief. -Discontinue shoulder physical therapy. -Continue strengthening exercises for hips and knees to reduce fall risk. -Trial of CBD daily cream with menthol for topical pain relief.  Hypertension Blood pressure readings at home occasionally elevated, but generally within acceptable range. No symptoms of hypertensive urgency or emergency. -Continue current antihypertensive regimen. -Ensure proper technique for home blood pressure monitoring:rest for 10-15 minutes prior to measurement.  General Health Maintenance -Order routine blood work  to monitor kidney function, cholesterol, sodium, thyroid, and glucose levels. -Update tetanus vaccination, to be administered at the pharmacy. -Schedule follow-up appointments for 3-4 months and 8 months out, with a comprehensive physical exam at the latter visit.     Danise Edge, MD

## 2024-02-13 NOTE — Patient Instructions (Addendum)
 CBD Daily menthol ultra strength at [facility] Body website Shoulder Pain Many things can cause shoulder pain, including: An injury to the shoulder. Overuse of the shoulder. Arthritis. The source of the pain can be: Inflammation. An injury to the shoulder joint. An injury to a tendon, ligament, or bone. Follow these instructions at home: Pay attention to changes in your symptoms. Let your health care provider know about them. Follow these instructions to relieve your pain. If you have a removable sling: Wear the sling as told by your provider. Remove it only as told by your provider. Check the skin around the sling every day. Tell your provider about any concerns. Loosen the sling if your fingers tingle, become numb, or become cold. Keep the sling clean. If the sling is not waterproof: Do not let it get wet. Remove it to shower or bathe. Move your arm as little as possible, but keep your hand moving to prevent swelling. Managing pain, stiffness, and swelling  If told, put ice on the painful area. If you have a removable sling or immobilizer, remove it as told by your provider. Put ice in a plastic bag. Place a towel between your skin and the bag. Leave the ice on for 20 minutes, 2-3 times a day. If your skin turns bright red, remove the ice right away to prevent skin damage. The risk of damage is higher if you cannot feel pain, heat, or cold. Move your fingers often to reduce stiffness and swelling. Squeeze a soft ball or a foam pad as much as possible. This helps to keep the shoulder from swelling. It also helps to strengthen the arm. General instructions Take over-the-counter and prescription medicines only as told by your provider. Exercise may help with pain management. Perform exercises if told by your provider. You may be referred to a physical therapist to help in your recovery process. Keep all follow-up visits in order to avoid any type of permanent shoulder disability or  chronic pain problems. Contact a health care provider if: Your pain is not relieved with medicines. New pain develops in your arm, hand, or fingers. You loosen your sling and your arm, hand, or fingers remain tingly, numb, swollen, or painful. Get help right away if: Your arm, hand, or fingers turn white or blue. This information is not intended to replace advice given to you by your health care provider. Make sure you discuss any questions you have with your health care provider. Document Revised: 07/02/2022 Document Reviewed: 07/02/2022 Elsevier Patient Education  2024 [employer].

## 2024-02-14 DIAGNOSIS — G8929 Other chronic pain: Secondary | ICD-10-CM | POA: Diagnosis not present

## 2024-02-14 DIAGNOSIS — M25552 Pain in left hip: Secondary | ICD-10-CM | POA: Diagnosis not present

## 2024-02-14 DIAGNOSIS — M25512 Pain in left shoulder: Secondary | ICD-10-CM | POA: Diagnosis not present

## 2024-02-14 LAB — IRON,TIBC AND FERRITIN PANEL
%SAT: 27 % (ref 16–45)
Ferritin: 40 ng/mL (ref 16–288)
Iron: 85 ug/dL (ref 45–160)
TIBC: 319 ug/dL (ref 250–450)

## 2024-02-17 DIAGNOSIS — M25512 Pain in left shoulder: Secondary | ICD-10-CM | POA: Diagnosis not present

## 2024-02-17 DIAGNOSIS — G8929 Other chronic pain: Secondary | ICD-10-CM | POA: Diagnosis not present

## 2024-02-17 DIAGNOSIS — M25552 Pain in left hip: Secondary | ICD-10-CM | POA: Diagnosis not present

## 2024-02-18 ENCOUNTER — Other Ambulatory Visit: Payer: Self-pay | Admitting: Family

## 2024-02-21 DIAGNOSIS — M25552 Pain in left hip: Secondary | ICD-10-CM | POA: Diagnosis not present

## 2024-02-21 DIAGNOSIS — M25512 Pain in left shoulder: Secondary | ICD-10-CM | POA: Diagnosis not present

## 2024-02-21 DIAGNOSIS — G8929 Other chronic pain: Secondary | ICD-10-CM | POA: Diagnosis not present

## 2024-02-23 DIAGNOSIS — M25512 Pain in left shoulder: Secondary | ICD-10-CM | POA: Diagnosis not present

## 2024-02-23 DIAGNOSIS — G8929 Other chronic pain: Secondary | ICD-10-CM | POA: Diagnosis not present

## 2024-02-23 DIAGNOSIS — M25552 Pain in left hip: Secondary | ICD-10-CM | POA: Diagnosis not present

## 2024-02-27 ENCOUNTER — Encounter: Payer: Self-pay | Admitting: Family Medicine

## 2024-02-28 DIAGNOSIS — M25552 Pain in left hip: Secondary | ICD-10-CM | POA: Diagnosis not present

## 2024-02-28 DIAGNOSIS — G8929 Other chronic pain: Secondary | ICD-10-CM | POA: Diagnosis not present

## 2024-02-28 DIAGNOSIS — M25512 Pain in left shoulder: Secondary | ICD-10-CM | POA: Diagnosis not present

## 2024-03-02 DIAGNOSIS — M25552 Pain in left hip: Secondary | ICD-10-CM | POA: Diagnosis not present

## 2024-03-02 DIAGNOSIS — M25512 Pain in left shoulder: Secondary | ICD-10-CM | POA: Diagnosis not present

## 2024-03-02 DIAGNOSIS — G8929 Other chronic pain: Secondary | ICD-10-CM | POA: Diagnosis not present

## 2024-03-06 DIAGNOSIS — M25512 Pain in left shoulder: Secondary | ICD-10-CM | POA: Diagnosis not present

## 2024-03-06 DIAGNOSIS — M25552 Pain in left hip: Secondary | ICD-10-CM | POA: Diagnosis not present

## 2024-03-06 DIAGNOSIS — G8929 Other chronic pain: Secondary | ICD-10-CM | POA: Diagnosis not present

## 2024-03-15 DIAGNOSIS — G4733 Obstructive sleep apnea (adult) (pediatric): Secondary | ICD-10-CM | POA: Diagnosis not present

## 2024-03-19 ENCOUNTER — Other Ambulatory Visit: Payer: Self-pay

## 2024-03-19 ENCOUNTER — Ambulatory Visit: Payer: Medicare HMO | Admitting: Family Medicine

## 2024-03-19 VITALS — BP 132/76 | HR 57 | Ht 64.0 in | Wt 202.0 lb

## 2024-03-19 DIAGNOSIS — G8929 Other chronic pain: Secondary | ICD-10-CM

## 2024-03-19 DIAGNOSIS — M25512 Pain in left shoulder: Secondary | ICD-10-CM | POA: Diagnosis not present

## 2024-03-19 NOTE — Patient Instructions (Signed)
Thank you for coming in today.   You received an injection today. Seek immediate medical attention if the joint becomes red, extremely painful, or is oozing fluid.   Check back in 3 months 

## 2024-03-19 NOTE — Progress Notes (Signed)
   Rubin Payor, PhD, LAT, ATC acting as a scribe for [name], MD.  Jackie Stevens is a 78 y.o. female who presents to [facility] today for cont'd L shoulder pain. Pt was last seen by Dr. Denyse Amass on 02/07/24 and was advised to cont PT. Last L GH steroid injection, 12/27/23.  Today, pt reports L shoulder pain is returning. Pain w/ certain motions.   Dx imaging: 12/27/23 L shoulder XR             05/27/22 L shoulder XR  Pertinent review of systems: no fever or chills  Relevant historical information: HTN, Dementia   Exam:  BP 132/76   Pulse (!) 57   Ht 5\' 4"  (1.626 m)   Wt 202 lb (91.6 kg)   SpO2 96%   BMI 34.67 kg/m  General: Well Developed, well nourished, and in no acute distress.   MSK: Left shoulder decreased range of motion.    Lab and Radiology Results  Procedure: Real-time Ultrasound Guided Injection of left shoulder glenohumeral joint posterior approach Device: Philips Affiniti 50G/GE Logiq Images permanently stored and available for review in PACS Verbal informed consent obtained.  Discussed risks and benefits of procedure. Warned about infection, bleeding, hyperglycemia damage to structures among others. Patient expresses understanding and agreement Time-out conducted.   Noted no overlying erythema, induration, or other signs of local infection.   Skin prepped in a sterile fashion.   Local anesthesia: Topical Ethyl chloride.   With sterile technique and under real time ultrasound guidance: 40 mg of Kenalog and 2 mL of Marcaine injected into glenohumeral joint. Fluid seen entering the joint capsule.   Completed without difficulty   Pain immediately resolved suggesting accurate placement of the medication.   Advised to call if fevers/chills, erythema, induration, drainage, or persistent bleeding.   Images permanently stored and available for review in the ultrasound unit.  Impression: Technically successful ultrasound guided  injection.         Assessment and Plan: 78 y.o. female with chronic left shoulder pain due to severe DJD.  She is not a good surgical candidate.  Plan for steroid injection today.  We did talk a little bit about Zilretta and PRP.  These both will be out-of-pocket and expensive and I do not have great evidence that they will work on the shoulder.  If needed we could try them in the future but for now continue intermittent steroid injections and home exercise program learned with physical therapy.  Recheck in 3 months and anticipate conventional steroid injection at that time.   PDMP not reviewed this encounter. Orders Placed This Encounter  Procedures   [place] LIMITED JOINT SPACE STRUCTURES UP LEFT(NO LINKED CHARGES)    Reason for Exam (SYMPTOM  OR DIAGNOSIS REQUIRED):   left shoulder pain    Preferred imaging location?:   [facility]   No orders of the defined types were placed in this encounter.    Discussed warning signs or symptoms. Please see discharge instructions. Patient expresses understanding.   The above documentation has been reviewed and is accurate and complete [name], M.D.

## 2024-04-06 ENCOUNTER — Encounter: Payer: Self-pay | Admitting: Physician Assistant

## 2024-04-16 ENCOUNTER — Other Ambulatory Visit: Payer: Self-pay | Admitting: Orthopaedic Surgery

## 2024-04-30 ENCOUNTER — Other Ambulatory Visit: Payer: Self-pay | Admitting: Family Medicine

## 2024-04-30 NOTE — Telephone Encounter (Unsigned)
 Copied from CRM [id number]. Topic: Clinical - Medication Refill >> Apr 30, 2024  3:42 PM Trula Gable C wrote: Medication: naproxen  (NAPROSYN ) 375 MG tablet  Has the patient contacted their pharmacy? Yes (Agent: If no, request that the patient contact the pharmacy for the refill. If patient does not wish to contact the pharmacy document the reason why and proceed with request.) (Agent: If yes, when and what did the pharmacy advise?)  This is the patient's preferred pharmacy:  [id number] - [place], [place] - [address] [place] [address] Phone: [phone] Fax: [phone]  Is this the correct pharmacy for this prescription? Yes If no, delete pharmacy and type the correct one.   Has the prescription been filled recently? No  Is the patient out of the medication? Yes  Has the patient been seen for an appointment in the last year OR does the patient have an upcoming appointment? Yes  Can we respond through MyChart? Yes  Agent: Please be advised that Rx refills may take up to 3 business days. We ask that you follow-up with your pharmacy.

## 2024-05-01 MED ORDER — NAPROXEN 375 MG PO TABS: 375.0000 mg | ORAL_TABLET | Freq: Two times a day (BID) | ORAL | 3 refills | Status: DC

## 2024-05-08 ENCOUNTER — Other Ambulatory Visit: Payer: Self-pay | Admitting: Family Medicine

## 2024-05-09 ENCOUNTER — Emergency Department (HOSPITAL_BASED_OUTPATIENT_CLINIC_OR_DEPARTMENT_OTHER)
Admission: EM | Admit: 2024-05-09 | Discharge: 2024-05-09 | Disposition: A | Discharge status: Home / Self Care | Attending: Emergency Medicine | Admitting: Emergency Medicine

## 2024-05-09 ENCOUNTER — Emergency Department (HOSPITAL_BASED_OUTPATIENT_CLINIC_OR_DEPARTMENT_OTHER)

## 2024-05-09 ENCOUNTER — Other Ambulatory Visit: Payer: Self-pay

## 2024-05-09 DIAGNOSIS — R519 Headache, unspecified: Secondary | ICD-10-CM | POA: Insufficient documentation

## 2024-05-09 DIAGNOSIS — F0390 Unspecified dementia without behavioral disturbance: Secondary | ICD-10-CM | POA: Diagnosis not present

## 2024-05-09 DIAGNOSIS — G9389 Other specified disorders of brain: Secondary | ICD-10-CM | POA: Diagnosis not present

## 2024-05-09 DIAGNOSIS — I10 Essential (primary) hypertension: Secondary | ICD-10-CM | POA: Diagnosis not present

## 2024-05-09 DIAGNOSIS — E871 Hypo-osmolality and hyponatremia: Secondary | ICD-10-CM | POA: Diagnosis not present

## 2024-05-09 LAB — CBC WITH DIFFERENTIAL/PLATELET
Abs Immature Granulocytes: 0.02 10*3/uL (ref 0.00–0.07)
Basophils Absolute: 0 10*3/uL (ref 0.0–0.1)
Basophils Relative: 0 %
Eosinophils Absolute: 0.3 10*3/uL (ref 0.0–0.5)
Eosinophils Relative: 4 %
HCT: 36.5 % (ref 36.0–46.0)
Hemoglobin: 12.2 g/dL (ref 12.0–15.0)
Immature Granulocytes: 0 %
Lymphocytes Relative: 16 %
Lymphs Abs: 1.3 10*3/uL (ref 0.7–4.0)
MCH: 31 pg (ref 26.0–34.0)
MCHC: 33.4 g/dL (ref 30.0–36.0)
MCV: 92.9 fL (ref 80.0–100.0)
Monocytes Absolute: 0.8 10*3/uL (ref 0.1–1.0)
Monocytes Relative: 10 %
Neutro Abs: 5.3 10*3/uL (ref 1.7–7.7)
Neutrophils Relative %: 70 %
Platelets: 234 10*3/uL (ref 150–400)
RBC: 3.93 MIL/uL (ref 3.87–5.11)
RDW: 12.6 % (ref 11.5–15.5)
WBC: 7.7 10*3/uL (ref 4.0–10.5)
nRBC: 0 % (ref 0.0–0.2)

## 2024-05-09 LAB — BASIC METABOLIC PANEL WITH GFR
Anion gap: 10 (ref 5–15)
BUN: 20 mg/dL (ref 8–23)
CO2: 29 mmol/L (ref 22–32)
Calcium: 8.9 mg/dL (ref 8.9–10.3)
Chloride: 95 mmol/L — ABNORMAL LOW (ref 98–111)
Creatinine, Ser: 0.79 mg/dL (ref 0.44–1.00)
GFR, Estimated: >60 mL/min
Glucose, Bld: 123 mg/dL — ABNORMAL HIGH (ref 70–99)
Potassium: 3.7 mmol/L (ref 3.5–5.1)
Sodium: 134 mmol/L — ABNORMAL LOW (ref 135–145)

## 2024-05-09 NOTE — ED Provider Notes (Signed)
 [place] EMERGENCY DEPARTMENT AT [facility] Provider Note   CSN: [id number] Arrival date & time: 05/09/24  1649     History  Chief Complaint  Patient presents with   Headache    Jackie Stevens is a 78 y.o. female.  Patient here with headache high blood pressure.  History of high cholesterol high blood pressure.  Denies any weakness numbness tingling.  History of dementia.  Is not having any headache now.  Denies any speech changes vision changes.  No chest pain or shortness of breath.  No vision loss.  No fever no neck pain.  No falls.  The history is provided by the patient.       Home Medications Prior to Admission medications   Medication Sig Start Date End Date Taking? Authorizing Provider  Azelastine  HCl 137 MCG/SPRAY SOLN Place 2 sprays into both nostrils in the morning and at bedtime. 02/13/24   Neda Balk, MD  albuterol  (VENTOLIN  HFA) 108 (90 Base) MCG/ACT inhaler TAKE 2 PUFFS BY MOUTH EVERY 6 HOURS AS NEEDED FOR WHEEZE OR SHORTNESS OF BREATH 09/21/23   Neda Balk, MD  amLODipine  (NORVASC ) 2.5 MG tablet TAKE 1 TABLET BY MOUTH EVERY DAY 05/08/24   Neda Balk, MD  benzonatate  (TESSALON  PERLES) 100 MG capsule Take 1 capsule (100 mg total) by mouth 3 (three) times daily as needed for cough. 09/21/22   Neda Balk, MD  Black Pepper-Turmeric (TURMERIC CURCUMIN) 04-999 MG CAPS 1 tablet daily. 02/09/16   [provider]  carboxymethylcellulose (REFRESH PLUS) 0.5 % SOLN Place 1 drop into both eyes 3 (three) times daily as needed (dry eyes).    [provider]  Cholecalciferol-Vitamin C (VITAMIN D3-VITAMIN C) 1000-500 UNIT-MG CAPS daily. 04/08/10   [provider]  docusate sodium  (COLACE) 100 MG capsule Take 100 mg by mouth daily.    [provider]  donepezil  (ARICEPT ) 10 MG tablet TAKE 1 TABLET BY MOUTH EVERY DAY 12/20/23   Alane Allen, Sara E, PA-C  famotidine  (PEPCID ) 20 MG tablet TAKE 2 TABLETS (40 MG TOTAL) BY MOUTH  DAILY. 02/08/24   Neda Balk, MD  FLUoxetine  (PROZAC ) 10 MG tablet TAKE 1 TABLET BY MOUTH EVERY DAY 01/09/24   Neda Balk, MD  gabapentin  (NEURONTIN ) 300 MG capsule TAKE 1 CAPSULE BY MOUTH EVERYDAY AT BEDTIME 04/16/24   Arnie Lao, MD  hydrochlorothiazide  (HYDRODIURIL ) 25 MG tablet TAKE 1 TABLET (25 MG TOTAL) BY MOUTH DAILY. 05/08/24   Neda Balk, MD  levothyroxine  (SYNTHROID ) 125 MCG tablet TAKE 1 TABLET BY MOUTH EVERY DAY BEFORE BREAKFAST 02/08/24   Neda Balk, MD  losartan  (COZAAR ) 100 MG tablet TAKE 1 TABLET BY MOUTH EVERYDAY AT BEDTIME 05/08/24   Neda Balk, MD  memantine  (NAMENDA ) 10 MG tablet Take 1 tablet (10 mg total) by mouth 2 (two) times daily. 12/20/23   Wertman, Sara E, PA-C  metoprolol  tartrate (LOPRESSOR ) 100 MG tablet TAKE 1.5 TABLETS BY MOUTH 2 TIMES DAILY. 02/08/24   Neda Balk, MD  naproxen  (NAPROSYN ) 375 MG tablet Take 1 tablet (375 mg total) by mouth 2 (two) times daily with a meal. 05/01/24   Jarold Merlin B, FNP  nitroGLYCERIN  (NITROSTAT ) 0.4 MG SL tablet PLACE 1 TABLET UNDER THE TONGUE EVERY 5 MINUTES AS NEEDED FOR CHEST PAIN 11/07/18   Neda Balk, MD  omeprazole  (PRILOSEC ) 40 MG capsule TAKE 1 CAPSULE BY MOUTH TWICE A DAY 11/12/23   Neda Balk, MD  ondansetron  (ZOFRAN )  4 MG tablet Take 1 tablet (4 mg total) by mouth every 8 (eight) hours as needed for nausea or vomiting. 02/01/23   Neda Balk, MD  polyethylene glycol (MIRALAX  / GLYCOLAX ) 17 g packet Take 17 g by mouth daily.    [provider]  Psyllium-Calcium  (METAMUCIL PLUS CALCIUM ) CAPS daily. 04/08/10   [provider]  rosuvastatin  (CRESTOR ) 40 MG tablet TAKE 1 TABLET BY MOUTH EVERY DAY 08/17/23   Neda Balk, MD      Allergies    Neomycin -bacitracin zn-polymyx, Niacin, Ciprofloxacin , Flagyl  [metronidazole ], and Neomycin     Review of Systems   Review of Systems  Physical Exam Updated Vital Signs BP (!) 183/66 (BP Location: Left Arm)   Pulse 62    Temp 98.7 F (37.1 C) (Oral)   Resp 18   SpO2 98%  Physical Exam Vitals and nursing note reviewed.  Constitutional:      General: She is not in acute distress.    Appearance: She is well-developed. She is not ill-appearing.  HENT:     Head: Normocephalic and atraumatic.     Mouth/Throat:     Mouth: Mucous membranes are moist.  Eyes:     Extraocular Movements: Extraocular movements intact.     Right eye: Normal extraocular motion and no nystagmus.     Left eye: Normal extraocular motion and no nystagmus.     Conjunctiva/sclera: Conjunctivae normal.     Pupils: Pupils are equal, round, and reactive to light.  Cardiovascular:     Rate and Rhythm: Normal rate and regular rhythm.     Heart sounds: Normal heart sounds. No murmur heard. Pulmonary:     Effort: Pulmonary effort is normal. No respiratory distress.     Breath sounds: Normal breath sounds.  Abdominal:     Palpations: Abdomen is soft.     Tenderness: There is no abdominal tenderness.  Musculoskeletal:        General: No swelling.     Cervical back: Normal range of motion and neck supple.  Skin:    General: Skin is warm and dry.     Capillary Refill: Capillary refill takes less than 2 seconds.  Neurological:     Mental Status: She is alert.     Comments: 5+ out of 5 strength throughout, normal sensation, no drift, normal finger-nose-finger, normal speech  Psychiatric:        Mood and Affect: Mood normal.     ED Results / Procedures / Treatments   Labs (all labs ordered are listed, but only abnormal results are displayed) Labs Reviewed  BASIC METABOLIC PANEL WITH GFR - Abnormal; Notable for the following components:      Result Value   Sodium 134 (*)    Chloride 95 (*)    Glucose, Bld 123 (*)    All other components within normal limits  CBC WITH DIFFERENTIAL/PLATELET    EKG EKG Interpretation Date/Time:  Wednesday May 09 2024 17:13:56 EDT Ventricular Rate:  62 PR Interval:  226 QRS Duration:  124 QT  Interval:  455 QTC Calculation: 463 R Axis:   -49  Text Interpretation: Sinus rhythm Prolonged PR interval Left bundle branch block Confirmed by Lowery Rue [phone]) on 05/09/2024 5:25:20 PM  Radiology CT Head Wo Contrast Result Date: 05/09/2024 CLINICAL DATA:  Headache EXAM: CT HEAD WITHOUT CONTRAST TECHNIQUE: Contiguous axial images were obtained from the base of the skull through the vertex without intravenous contrast. RADIATION DOSE REDUCTION: This exam was performed according to the  departmental dose-optimization program which includes automated exposure control, adjustment of the mA and/or kV according to patient size and/or use of iterative reconstruction technique. COMPARISON:  CT 05/09/2021 FINDINGS: Brain: No acute territorial infarction, hemorrhage or intracranial mass. Mild atrophy. Diffuse ventricular enlargement, slightly increased compared to head CT from 2022 but with similar degree of atrophy present. Vascular: No hyperdense vessels.  Carotid vascular calcification Skull: Normal. Negative for fracture or focal lesion. Sinuses/Orbits: No acute finding. Other: None IMPRESSION: 1. No CT evidence for acute intracranial abnormality. 2. Atrophy. Diffuse ventricular enlargement, slightly increased compared to head CT from 2022 but with similar degree of atrophy present. Findings raise possibility of normal pressure hydrocephalus. Electronically Signed   By: Esmeralda Hedge M.D.   On: 05/09/2024 18:05    Procedures Procedures    Medications Ordered in ED Medications - No data to display  ED Course/ Medical Decision Making/ A&P                                 Medical Decision Making Amount and/or Complexity of Data Reviewed Labs: ordered. Radiology: ordered.   Jackie Stevens is here with headache pressure.  Blood pressure 183/66.  Normal vitals.  No fever.  Well-appearing.  She denies any weakness numbness tingling.  She denies any headache now.  She has normal coordination normal  visual fields normal speech very pleasant.  She has no chest pain no shortness of breath.  Was sent by her nursing facility.  Patient overall asymptomatic.  Will check CBC BMP EKG head CT to be conservative.  Will monitor blood pressure.  Differential diagnosis likely just asymptomatic hypertension, mild migraine doubt head bleed ACS electrolyte abnormality dehydration.  Per my review and interpretation labs no significant leukocytosis anemia or electrolyte abnormality.  EKG shows sinus rhythm.  No ischemic changes per my review and interpretation.  Head CT is unremarkable per radiology report.  Blood pressure 150 systolic on my reevaluation.   Overall she is asymptomatic she is feeling better.  I suspect her blood pressure is mildly elevated today from her headache.  Suspect that this was likely a mild migraine.  Discharged in good condition.  Understands return precautions.  Discharged.  This chart was dictated using voice recognition software.  Despite best efforts to proofread,  errors can occur which can change the documentation meaning.         Final Clinical Impression(s) / ED Diagnoses Final diagnoses:  Nonintractable headache, unspecified chronicity pattern, unspecified headache type    Rx / DC Orders ED Discharge Orders     None         Lowery Rue, DO 05/09/24 1827

## 2024-05-09 NOTE — ED Triage Notes (Signed)
 From [facility], c/o headache and HTN (2220s/110s) since this morning Hx of controlled HTN. Dementia.

## 2024-05-10 ENCOUNTER — Ambulatory Visit (INDEPENDENT_AMBULATORY_CARE_PROVIDER_SITE_OTHER): Admitting: Family Medicine

## 2024-05-10 ENCOUNTER — Ambulatory Visit: Payer: Self-pay | Admitting: Family Medicine

## 2024-05-10 ENCOUNTER — Encounter: Payer: Self-pay | Admitting: Family Medicine

## 2024-05-10 VITALS — BP 152/84 | HR 59 | Ht 64.0 in | Wt 206.0 lb

## 2024-05-10 DIAGNOSIS — I10 Essential (primary) hypertension: Secondary | ICD-10-CM

## 2024-05-10 DIAGNOSIS — M545 Low back pain, unspecified: Secondary | ICD-10-CM

## 2024-05-10 DIAGNOSIS — R8281 Pyuria: Secondary | ICD-10-CM | POA: Diagnosis not present

## 2024-05-10 LAB — POC URINALSYSI DIPSTICK (AUTOMATED)
Bilirubin, UA: NEGATIVE
Blood, UA: NEGATIVE
Glucose, UA: NEGATIVE
Ketones, UA: NEGATIVE
Nitrite, UA: NEGATIVE
Protein, UA: NEGATIVE
Spec Grav, UA: <=1.005 — AB (ref 1.010–1.025)
Urobilinogen, UA: 0.2 U/dL
pH, UA: 7 (ref 5.0–8.0)

## 2024-05-10 MED ORDER — AMLODIPINE BESYLATE 5 MG PO TABS: 5.0000 mg | ORAL_TABLET | Freq: Every day | ORAL | 0 refills | Status: DC

## 2024-05-10 NOTE — Progress Notes (Signed)
 Acute Office Visit  Subjective:     Patient ID: Jackie Stevens, female    DOB: Feb 24, 1946, 78 y.o.   MRN: [id number]  Chief Complaint  Patient presents with   Hypertension     Patient is in today for elevated blood pressure. Son is with her for visit today.   Discussed the use of AI scribe software for clinical note transcription with the patient, who gave verbal consent to proceed.  History of Present Illness SHANECA Stevens is a 78 year old female who presents with elevated blood pressure readings.  She has experienced elevated blood pressure readings since yesterday, with measurements at her memory center of 180/90 mmHg, 191/80 mmHg, and 200/90 mmHg. She went to the ED and an EKG and CT scan of her head performed at the emergency room were normal. Her blood pressure normalized by the time she returned home, but today it was again high, recorded in the 180s over 80 or 90 mmHg.  She has a dull headache since yesterday, described as 'just dull' and not severe, with a sensation of 'dull pressure'.  She is currently taking amlodipine  2.5 mg once daily, hydrochlorothiazide  25 mg once daily, losartan  100 mg once daily, and metoprolol  150 mg twice daily, without missing any doses.  She experiences low back pain described as a 'really bad ache' in the middle of her low back radiating down left leg, persistent and aching in nature, without recent heavy lifting, bending, or falls.  She has a history of arthritis in her shoulder, causing significant pain, and receives injections for this condition. She also reports pain in her left hip and low back, attributed to arthritis, with a fall six to seven years ago possibly exacerbating her condition. 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 She is due to see her sports med specialist, Dr. Alease Hunter, on June 30th.  No urinary symptoms such as burning, trouble urinating, or blood in urine. No neurologic symptoms such as slurred  speech or confusion. Her last eye doctor appointment was within the past year.          ROS All review of systems negative except what is listed in the HPI      Objective:    BP (!) 152/84   Pulse (!) 59   Ht 5\' 4"  (1.626 m)   Wt 206 lb (93.4 kg)   SpO2 96%   BMI 35.36 kg/m    Physical Exam Vitals reviewed.  Constitutional:      Appearance: Normal appearance. She is obese.  Cardiovascular:     Rate and Rhythm: Normal rate and regular rhythm.     Heart sounds: Normal heart sounds.  Pulmonary:     Effort: Pulmonary effort is normal.     Breath sounds: Normal breath sounds.  Abdominal:     Tenderness: There is no right CVA tenderness or left CVA tenderness.  Musculoskeletal:     Comments: Left lumbar paraspinal muscles tender to palpation  Skin:    General: Skin is warm and dry.  Neurological:     Mental Status: She is alert and oriented to person, place, and time.  Psychiatric:        Mood and Affect: Mood normal.        Behavior: Behavior normal.        Thought Content: Thought content normal.        Judgment: Judgment normal.     Results for orders placed or performed  in visit on 05/10/24  POCT Urinalysis Dipstick (Automated)  Result Value Ref Range   Color, UA yellow    Clarity, UA clear    Glucose, UA Negative Negative   Bilirubin, UA negative    Ketones, UA negative    Spec Grav, UA <=1.005 (A) 1.010 - 1.025   Blood, UA negative    pH, UA 7.0 5.0 - 8.0   Protein, UA Negative Negative   Urobilinogen, UA 0.2 0.2 or 1.0 E.U./dL   Nitrite, UA negative    Leukocytes, UA Small (1+) (A) Negative        Assessment & Plan:   Problem List Items Addressed This Visit       Active Problems   Essential hypertension - Primary   Relevant Medications   amLODipine  (NORVASC ) 5 MG tablet   Other Relevant Orders   POCT Urinalysis Dipstick (Automated) (Completed)   Urine Culture   Low back pain   Relevant Orders   POCT Urinalysis Dipstick (Automated)  (Completed)   Urine Culture   ED workup stable BP still high - likely the cause of your headache. Chronic pain/arthritis could be triggering the higher BP - follow-up with Dr. Alease Hunter. Printing stretches for low back/sciatica pain. Heat, massage, stretching, etc. Given how high your BP has been, let's try increasing amlodipine  to 5 mg daily. Monitor BP daily at home and follow-up for nurse visit in 2 weeks to recheck.  UA with trace leuks - will send for culture. Stay well hydrated.  Please contact office for follow-up if symptoms do not improve or worsen. Seek emergency care if symptoms become severe.    Meds ordered this encounter  Medications   amLODipine  (NORVASC ) 5 MG tablet    Sig: Take 1 tablet (5 mg total) by mouth daily.    Dispense:  90 tablet    Refill:  0    Supervising Provider:   Randie Bustle A [[id number]]    Return in about 2 weeks (around 05/24/2024) for BP check with nurse.  Everlina Hock, NP

## 2024-05-10 NOTE — Telephone Encounter (Signed)
 Chief Complaint: Elevated blood pressure (181/80)  Symptoms: Fatigue (she was up late at ED yesterday) Pertinent Negatives: Patient denies headache today, difficulty breathing, blurred or double vision, unsteady walking, chest pain  Disposition: [x] Appointment (In office)  Additional Notes: Spoke with pt's son, Donata Fryer.  Patient went to ED last night for an elevated BP and headache. Pt had an EKG and CT scan of head. Pt blood pressure went back down and she was discharged. Pt BP today is 181/80. Pt scheduled for an appointment today in office. This RN educated patient son on new-worsening symptoms and when to call back/seek emergent care. Patient son verbalized understanding and agrees to plan..    Copied from CRM [id number]. Topic: Clinical - Red Word Triage >> May 10, 2024 12:21 PM Abigail D wrote: Red Word that prompted transfer to Nurse Triage: 180/90 BP, was in ER last night but still high. Reason for Disposition  Systolic BP  >= 180 OR Diastolic >= 110  Answer Assessment - Initial Assessment Questions 1. BLOOD PRESSURE: "What is the blood pressure?" "Did you take at least two measurements 5 minutes apart?"     181/80 2. ONSET: "When did you take your blood pressure?"     Less than 1 hour ago 3. HOW: "How did you take your blood pressure?" (e.g., automatic home BP monitor, visiting nurse)     At memory center she goes to during day 4. HISTORY: "Do you have a history of high blood pressure?"     "Had it before" 5. MEDICINES: "Are you taking any medicines for blood pressure?" "Have you missed any doses recently?"     Takes BP medications  Protocols used: Blood Pressure - High-A-AH

## 2024-05-10 NOTE — Patient Instructions (Signed)
 ED workup stable BP still high - likely the cause of your headache. Chronic pain/arthritis could be triggering the higher BP - follow-up with Dr. Alease Hunter. Printing stretches for low back/sciatica pain. Heat, massage, stretching, etc. Given how high your BP has been, let's try increasing amlodipine  to 5 mg daily. Monitor BP daily at home and follow-up for nurse visit in 2 weeks to recheck.  Please contact office for follow-up if symptoms do not improve or worsen. Seek emergency care if symptoms become severe.

## 2024-05-13 ENCOUNTER — Other Ambulatory Visit: Payer: Self-pay | Admitting: Family

## 2024-05-13 ENCOUNTER — Ambulatory Visit: Payer: Self-pay | Admitting: Family

## 2024-05-13 LAB — URINE CULTURE
MICRO NUMBER:: 16514443
SPECIMEN QUALITY:: ADEQUATE

## 2024-05-13 MED ORDER — SULFAMETHOXAZOLE-TRIMETHOPRIM 800-160 MG PO TABS: 1.0000 | ORAL_TABLET | Freq: Two times a day (BID) | ORAL | 0 refills | Status: DC

## 2024-05-30 ENCOUNTER — Other Ambulatory Visit: Payer: Self-pay | Admitting: Family Medicine

## 2024-05-30 DIAGNOSIS — K219 Gastro-esophageal reflux disease without esophagitis: Secondary | ICD-10-CM

## 2024-05-30 DIAGNOSIS — J302 Other seasonal allergic rhinitis: Secondary | ICD-10-CM

## 2024-05-30 DIAGNOSIS — R059 Cough, unspecified: Secondary | ICD-10-CM

## 2024-06-08 NOTE — Progress Notes (Unsigned)
   LILLETTE Ileana Collet, PhD, LAT, ATC acting as a scribe for Artist Lloyd, MD.  Jackie Stevens is a 78 y.o. female who presents to [facility] today for ***   Pertinent review of systems: ***  Relevant historical information: ***   Exam:  There were no vitals taken for this visit. General: Well Developed, well nourished, and in no acute distress.   MSK: ***    Lab and Radiology Results No results found for this or any previous visit (from the past 72 hours). No results found.     Assessment and Plan: 78 y.o. female with ***   PDMP not reviewed this encounter. No orders of the defined types were placed in this encounter.  No orders of the defined types were placed in this encounter.    Discussed warning signs or symptoms. Please see discharge instructions. Patient expresses understanding.   ***

## 2024-06-09 ENCOUNTER — Other Ambulatory Visit: Payer: Self-pay | Admitting: Orthopaedic Surgery

## 2024-06-11 ENCOUNTER — Encounter: Payer: Self-pay | Admitting: Family Medicine

## 2024-06-11 ENCOUNTER — Encounter: Payer: Self-pay | Admitting: Physician Assistant

## 2024-06-11 ENCOUNTER — Ambulatory Visit: Payer: Self-pay

## 2024-06-11 ENCOUNTER — Ambulatory Visit: Admitting: Family Medicine

## 2024-06-11 ENCOUNTER — Ambulatory Visit: Admitting: Physician Assistant

## 2024-06-11 VITALS — Resp 20 | Ht 64.0 in | Wt 199.0 lb

## 2024-06-11 VITALS — BP 136/70 | HR 57 | Ht 64.0 in | Wt 199.0 lb

## 2024-06-11 DIAGNOSIS — M25512 Pain in left shoulder: Secondary | ICD-10-CM | POA: Diagnosis not present

## 2024-06-11 DIAGNOSIS — G309 Alzheimer's disease, unspecified: Secondary | ICD-10-CM

## 2024-06-11 DIAGNOSIS — G8929 Other chronic pain: Secondary | ICD-10-CM

## 2024-06-11 DIAGNOSIS — M19012 Primary osteoarthritis, left shoulder: Secondary | ICD-10-CM | POA: Diagnosis not present

## 2024-06-11 DIAGNOSIS — F0280 Dementia in other diseases classified elsewhere without behavioral disturbance: Secondary | ICD-10-CM

## 2024-06-11 MED ORDER — DONEPEZIL HCL 10 MG PO TABS: ORAL_TABLET | ORAL | 3 refills | Status: AC

## 2024-06-11 MED ORDER — MEMANTINE HCL 10 MG PO TABS: 10.0000 mg | ORAL_TABLET | Freq: Two times a day (BID) | ORAL | 2 refills | Status: AC

## 2024-06-11 NOTE — Patient Instructions (Signed)
    Follow up in  6 months. Continue donepezil  10 mg daily.  Continue Memantine  10 mg twice daily.   Continue taking the vitamins Continue the adult day program

## 2024-06-11 NOTE — Patient Instructions (Addendum)
 Thank you for coming in today.   You received an injection today. Seek immediate medical attention if the joint becomes red, extremely painful, or is oozing fluid.   See you back in 3 months for consideration of repeat injections for the left shoulder and left carpal tunnel

## 2024-06-11 NOTE — Progress Notes (Signed)
 Assessment/Plan:   Dementia due to Alzheimer's disease  Jackie Stevens is a very pleasant 78 y.o. RH female with a history of hypertension, hyperlipidemia, thyroid  cancer s/p partial thyroidectomy, arthritis with chronic pain, chronic anemia of iron deficiency, B12 deficiency, vitamin D  deficiency, hyperglycemia seen today in follow up for memory loss. Patient is currently on donepezil  10 mg daily and memantine  10 mg twice daily, tolerating well.  Unfortunately, cognitive decline is noted.  She is still able to perform some ADLs with her ability.  Mood is good.  As before, discussed with her son the risk of increasing donepezil  to 23 mg grams daily, although this may not be therapeutic, he agrees to stay on the present therapy instead..      Follow up in 6  months. Continue donepezil  10 mg and memantine  10 mg twice daily, side effects discussed Recommend good control of her cardiovascular risk factors Continue to control mood as per PCP Continue ADP for social and cognitive stimulation     Subjective:    This patient is accompanied in the office by her son who supplements the history.  Previous records as well as any outside records available were reviewed prior to todays visit. Patient was last seen on 12/20/2023 with [facility] 13/30    Any changes in memory since last visit?  She forgets a little bit more-Son says.  She has more difficulty with short-term memory, especially with conversations and names of people.  She goes to adult day program 5 times a week, from 9 to 4PM, enjoyed it very much.  She likes to make ornaments, watching TV, listening to music and playing games.  She [place] with her other [place].  She has difficulty remembering how her children look like, needs to be reminded of who they are. repeats oneself?  Endorsed, more than prior Disoriented when walking into a room?  Sometimes she does not know if she is in [place]  or in her native [place] .    Leaving objects?  May misplace things but not in unusual places.  She likes to collect Christmas ornaments.   Wandering behavior?  denies   Any personality changes since last visit?  Denies.   Any worsening depression?:  Denies.   Hallucinations or paranoia?  Denies.   Seizures? denies    Any sleep changes?  She sleeps well unless she has to get up to go to the bathroom.  Denies vivid dreams, REM behavior or sleepwalking   Sleep apnea?  Source, uses CPAP Any hygiene concerns? Denies.  Independent of bathing and dressing?  Endorsed  Does the patient needs help with medications?  Son is in charge   Who is in charge of the finances?  Both sons are in charge     Any changes in appetite?  Does not eat as much, less quantity.     Patient have trouble swallowing? Occasionally she gags, but not with food, maybe when watching TV, not frequent.  Does the patient cook? No.  Her son prepares her the meals. Any headaches?  On May 09, 2024 she had a presentation to the ED with non-intractable headache secondary to elevated blood pressure, negative workup, negative imaging, diagnosed as mild migraine Any vision changes? Denies Chronic pain?  She has chronic left shoulder and hip pain due to arthritis, she takes injections, last one today to the L shoulder and to the wrist. Ambulates with difficulty? Denies.  Is a walker for stability and  safety  Recent falls or head injuries? Denies.     Unilateral weakness, numbness or tingling? denies   Any tremors?  Denies    Any anosmia?  Denies   Any incontinence of urine?  Occasionally, at night, at night she uses a diaper for prevention   Any bowel dysfunction?   Denies      Patient lives with her son  Does the patient drive? No longer drives     History on Initial Assessment 11/26/2019: This is a pleasant 78 year old right-handed woman with a history of hypertension, hyperlipidemia, thyroid  cancer s/p partial thyroidectomy, presenting for evaluation of memory  loss. She does not think her memory is good, there are certain things she cannot remember. She looks to her husband for answers several times during the visit. She lives with her husband and son. She manages medications without difficulties and denies getting lost driving. She became concerned about her memory because she had always used to do their checkbook but in February, something was not right. She could not figure it out, and stopped doing it since then. She states she is not even sure she can do it now. Her husband does not think there is anything significantly concerning about her memory. He reports the checkbook issue threw her off. She gets flustered when she does not remember things. He has not noticed any personality changes but they both note that she is more paranoid, closing the blinds at night. They have lived in the same house for 37 years but for the past 6 months, she feels like someone is always looking in their house and will shoot at them, I don't know why. She and her husband deny any hallucinations. She has always been worried about things, and is anxious today. She states mood is I don't know, she is afraid to do some things but thinks mood is alright. She notes some depression due to inability to travel like before. Her mother had Alzheimer's disease. No history of significant head injuries. She rarely drinks alcohol .    She denies any headaches, diplopia, dysarthria/dysphagia, neck/back pain, focal numbness/tingling/weakness, bowel/bladder dysfunction, tremors. She had a lot of dizziness for a time where she would feel lightheaded and have to hold on. I personally reviewed head CT without contrast done in August 2020 for dizziness which did not show any acute changes, there was moderate diffuse atrophy. She had an infection prior to the start of Covid-19 where she lost her sense of taste and smell, it has not come back. She usually gets 7 hours of sleep with her CPAP machine,  waking up at 4am. She is occasionally drowsy in the day.    PREVIOUS MEDICATIONS:   CURRENT MEDICATIONS:  Outpatient Encounter Medications as of 06/11/2024  Medication Sig   albuterol  (VENTOLIN  HFA) 108 (90 Base) MCG/ACT inhaler TAKE 2 PUFFS BY MOUTH EVERY 6 HOURS AS NEEDED FOR WHEEZE OR SHORTNESS OF BREATH   amLODipine  (NORVASC ) 5 MG tablet Take 1 tablet (5 mg total) by mouth daily.   Azelastine  HCl 137 MCG/SPRAY SOLN Place 2 sprays into both nostrils in the morning and at bedtime.   benzonatate  (TESSALON  PERLES) 100 MG capsule Take 1 capsule (100 mg total) by mouth 3 (three) times daily as needed for cough.   Black Pepper-Turmeric (TURMERIC CURCUMIN) 04-999 MG CAPS 1 tablet daily.   carboxymethylcellulose (REFRESH PLUS) 0.5 % SOLN Place 1 drop into both eyes 3 (three) times daily as needed (dry eyes).   Cholecalciferol-Vitamin C (VITAMIN  D3-VITAMIN C) 1000-500 UNIT-MG CAPS daily.   docusate sodium  (COLACE) 100 MG capsule Take 100 mg by mouth daily.   famotidine  (PEPCID ) 20 MG tablet TAKE 2 TABLETS (40 MG TOTAL) BY MOUTH DAILY.   FLUoxetine  (PROZAC ) 10 MG tablet TAKE 1 TABLET BY MOUTH EVERY DAY   gabapentin  (NEURONTIN ) 300 MG capsule TAKE 1 CAPSULE BY MOUTH EVERYDAY AT BEDTIME   hydrochlorothiazide  (HYDRODIURIL ) 25 MG tablet TAKE 1 TABLET (25 MG TOTAL) BY MOUTH DAILY.   levothyroxine  (SYNTHROID ) 125 MCG tablet TAKE 1 TABLET BY MOUTH EVERY DAY BEFORE BREAKFAST   losartan  (COZAAR ) 100 MG tablet TAKE 1 TABLET BY MOUTH EVERYDAY AT BEDTIME   metoprolol  tartrate (LOPRESSOR ) 100 MG tablet TAKE 1.5 TABLETS BY MOUTH 2 TIMES DAILY.   naproxen  (NAPROSYN ) 375 MG tablet Take 1 tablet (375 mg total) by mouth 2 (two) times daily with a meal.   nitroGLYCERIN  (NITROSTAT ) 0.4 MG SL tablet PLACE 1 TABLET UNDER THE TONGUE EVERY 5 MINUTES AS NEEDED FOR CHEST PAIN   omeprazole  (PRILOSEC ) 40 MG capsule TAKE 1 CAPSULE BY MOUTH TWICE A DAY   ondansetron  (ZOFRAN ) 4 MG tablet Take 1 tablet (4 mg total) by mouth every  8 (eight) hours as needed for nausea or vomiting.   polyethylene glycol (MIRALAX  / GLYCOLAX ) 17 g packet Take 17 g by mouth daily.   Psyllium-Calcium  (METAMUCIL PLUS CALCIUM ) CAPS daily.   rosuvastatin  (CRESTOR ) 40 MG tablet TAKE 1 TABLET BY MOUTH EVERY DAY   sulfamethoxazole -trimethoprim  (BACTRIM  DS) 800-160 MG tablet Take 1 tablet by mouth 2 (two) times daily.   [DISCONTINUED] donepezil  (ARICEPT ) 10 MG tablet TAKE 1 TABLET BY MOUTH EVERY DAY   [DISCONTINUED] memantine  (NAMENDA ) 10 MG tablet Take 1 tablet (10 mg total) by mouth 2 (two) times daily.   donepezil  (ARICEPT ) 10 MG tablet TAKE 1 TABLET BY MOUTH EVERY DAY   memantine  (NAMENDA ) 10 MG tablet Take 1 tablet (10 mg total) by mouth 2 (two) times daily.   [DISCONTINUED] gabapentin  (NEURONTIN ) 300 MG capsule TAKE 1 CAPSULE BY MOUTH EVERYDAY AT BEDTIME   No facility-administered encounter medications on file as of 06/11/2024.       12/20/2023   11:00 AM 12/07/2022    9:00 AM 05/03/2022    3:00 PM  MMSE - Mini Mental State Exam  Orientation to time 0 0 0  Orientation to Place 0 4 0  Registration 3 1 3   Attention/ Calculation 2 3 2   Recall 0 2 0  Language- name 2 objects 2 1 2   Language- repeat 1 1 1   Language- follow 3 step command 3 2 3   Language- read & follow direction 1 1 1   Write a sentence 1 1 0  Copy design 0 0 0  Total score 13 16 12        No data to display          Objective:     PHYSICAL EXAMINATION:    VITALS:   Vitals:   06/11/24 1038  Resp: 20  Weight: 199 lb (90.3 kg)  Height: 5' 4 (1.626 m)    GEN:  The patient appears stated age and is in NAD. HEENT:  Normocephalic, atraumatic.   Neurological examination:  General: NAD, well-groomed, appears stated age. Orientation: The patient is alert.  Not oriented to person, place and date Cranial nerves: There is good facial symmetry.The speech is fluent and clear. No aphasia or dysarthria. Fund of knowledge is reduced. Recent and remote memory are  impaired. Attention and concentration are  reduced. Able to name objects and repeat phrases.  Hearing is intact to conversational tone.   Sensation: Sensation is intact to light touch throughout Motor: Strength is at least antigravity x4. DTR's 2/4 in UE/LE     Movement examination: Tone: There is normal tone in the UE/LE Abnormal movements:  no tremor.  No myoclonus.  No asterixis.   Coordination:  There is no decremation with RAM's. Normal finger to nose  Gait and Station: The patient has some difficulty arising out of a deep-seated chair without the use of the hands.Uses a walker for stability . The patient's stride length is good.  Gait is cautious and narrow.    Thank you for allowing us  the opportunity to participate in the care of this nice patient. Please do not hesitate to contact us  for any questions or concerns.   Total time spent on today's visit was 30 minutes dedicated to this patient today, preparing to see patient, examining the patient, ordering tests and/or medications and counseling the patient, documenting clinical information in the EHR or other health record, independently interpreting results and communicating results to the patient/family, discussing treatment and goals, answering patient's questions and coordinating care.  Cc:  Domenica Harlene LABOR, MD  Jackie Stevens 06/11/2024 11:15 AM

## 2024-06-13 ENCOUNTER — Other Ambulatory Visit: Payer: Self-pay | Admitting: Family Medicine

## 2024-06-26 ENCOUNTER — Ambulatory Visit: Payer: Medicare HMO | Admitting: Physician Assistant

## 2024-07-29 NOTE — Assessment & Plan Note (Signed)
 Encourage heart healthy diet such as MIND or DASH diet, increase exercise, avoid trans fats, simple carbohydrates and processed foods, consider a krill or fish or flaxseed oil cap daily. Tolerating Rosuvastatin

## 2024-07-29 NOTE — Assessment & Plan Note (Signed)
 On Levothyroxine , continue to monitor

## 2024-07-29 NOTE — Assessment & Plan Note (Signed)
 Well controlled, no changes to meds. Encouraged heart healthy diet such as the DASH diet and exercise as tolerated.

## 2024-07-29 NOTE — Assessment & Plan Note (Signed)
 Doing well at home with her husband

## 2024-07-29 NOTE — Assessment & Plan Note (Signed)
 [id number] acceptable, minimize simple carbs. Increase exercise as tolerated.

## 2024-07-30 ENCOUNTER — Ambulatory Visit: Admitting: Physician Assistant

## 2024-07-30 ENCOUNTER — Ambulatory Visit (INDEPENDENT_AMBULATORY_CARE_PROVIDER_SITE_OTHER): Admitting: Family Medicine

## 2024-07-30 VITALS — BP 118/78 | HR 60 | Temp 98.2°F | Resp 12 | Ht 64.0 in | Wt 195.4 lb

## 2024-07-30 DIAGNOSIS — E782 Mixed hyperlipidemia: Secondary | ICD-10-CM | POA: Diagnosis not present

## 2024-07-30 DIAGNOSIS — R351 Nocturia: Secondary | ICD-10-CM | POA: Diagnosis not present

## 2024-07-30 DIAGNOSIS — F0394 Unspecified dementia, unspecified severity, with anxiety: Secondary | ICD-10-CM

## 2024-07-30 DIAGNOSIS — E038 Other specified hypothyroidism: Secondary | ICD-10-CM | POA: Diagnosis not present

## 2024-07-30 DIAGNOSIS — R739 Hyperglycemia, unspecified: Secondary | ICD-10-CM | POA: Diagnosis not present

## 2024-07-30 DIAGNOSIS — I10 Essential (primary) hypertension: Secondary | ICD-10-CM

## 2024-07-30 NOTE — Assessment & Plan Note (Signed)
 Up 2-3 x a night, check UA and culture

## 2024-07-30 NOTE — Patient Instructions (Signed)
 Dementia Dementia is a condition that affects the way the brain works. It often affects thinking and memory.  There are many types of dementia, including: Alzheimer's disease. This is the most common type. Vascular dementia. This type may happen due to a stroke. Lewy body dementia. This type may happen to people who have Parkinson's disease. Frontotemporal dementia. This type is caused by damage to nerve cells in certain parts of the brain. Some people may have more than one type. What are the causes? Dementia is caused by damage to cells in the brain. Some causes that can't be reversed include: Having a condition that affects the blood vessels of the brain. This may be diabetes or heart disease. Changes to genes. Some causes that can be reversed or slowed down include: Injury to the brain due to: A growth called a tumor. A blood clot. Too much fluid in the brain. Taking certain medicines. An infection. Problems with your thyroid . Not having enough vitamin B12 in the body. Having a disease that causes your body's defense system, called the immune system, to attack healthy parts of your body. What are the signs or symptoms? Symptoms of dementia start slowly and get worse with time. They may include: Problems remembering events or people. Getting lost easily. Forgetting appointments or to pay bills. Having trouble taking a bath or putting clothes on. Having trouble planning and making meals. Having trouble speaking. Changes in behavior or mood. How is this diagnosed? Dementia may be diagnosed based on: Your symptoms and medical history. A physical exam. Tests. These may include: Tests to check your thinking and memory to see how your brain is working. Lab tests. You may have tests on your blood or pee (urine). Imaging tests, such as a CT scan, a PET scan, or an MRI. Genetic testing. This may be done if other family members have had dementia. Your health care provider will talk  with you and your family, friends, or caregivers about your history and symptoms. How is this treated? Treatment depends on the cause of the dementia and should start as soon as possible. It might include: Taking medicines for symptoms. Taking medicines to help control or slow down the dementia. Treating the cause of your dementia. Your provider can help you find support groups and other members of the health care team who can help with your care. Follow these instructions at home: Medicines Take medicines only as told by your provider. Use a pill organizer or pill reminder to help you keep track of your medicines. Avoid taking medicines for pain or for sleep. These can affect your thinking. Lifestyle Make healthy choices. Be active as told by your provider. Do not smoke, vape, or use products with nicotine or tobacco in them. If you need help quitting, talk with your provider. Do not drink alcohol. When you feel a lot of stress, do something that helps you relax. Your provider can give you tips. Spend time with other people. Make sure you get good sleep at night. These tips can help: Try not to take naps during the day. Keep your bedroom dark and cool. Do not exercise in the few hours before you go to bed. Do not have foods or drinks with caffeine at night. Eating and drinking Drink enough fluid to keep your pee pale yellow. Eat a healthy diet. General instructions  Talk with your provider to decide on: What things you need help with. What your safety needs are. Ask your provider if it's safe for  you to drive. If told, wear a bracelet that tracks where you are or shows that you're a person with memory loss. Work with your family to make big legal or health decisions. This may include things like advance directives, medical power of attorney, or a living will. Where to find more information Alzheimer's Association: WesternTunes.it [employer] on Aging: BaseRingTones.pl World Health  Organization: VisitDestination.com.br Contact a health care provider if: You have any new symptoms. Your symptoms get worse. You have problems with swallowing. Get help right away if: You feel very sad or feel like you may hurt yourself or others. You have thoughts about taking your own life. Your family members are worried about your safety. These symptoms may be an emergency. Take one of these steps right away: Go to your nearest emergency room. Call 911. Call the National Suicide Prevention Lifeline at (817)539-9881 or 988. Text the Crisis Text Line at (669)660-2358. This information is not intended to replace advice given to you by your health care provider. Make sure you discuss any questions you have with your health care provider. Document Revised: 09/29/2023 Document Reviewed: 02/14/2023 Elsevier Patient Education  2024 [employer].

## 2024-07-31 ENCOUNTER — Encounter: Payer: Self-pay | Admitting: Family Medicine

## 2024-07-31 ENCOUNTER — Ambulatory Visit: Payer: Self-pay | Admitting: Family Medicine

## 2024-07-31 LAB — COMPREHENSIVE METABOLIC PANEL WITH GFR
ALT: 22 U/L (ref 0–35)
AST: 23 U/L (ref 0–37)
Albumin: 4.2 g/dL (ref 3.5–5.2)
Alkaline Phosphatase: 71 U/L (ref 39–117)
BUN: 23 mg/dL (ref 6–23)
CO2: 32 meq/L (ref 19–32)
Calcium: 8.6 mg/dL (ref 8.4–10.5)
Chloride: 93 meq/L — ABNORMAL LOW (ref 96–112)
Creatinine, Ser: 1.08 mg/dL (ref 0.40–1.20)
GFR: 48.7 mL/min — ABNORMAL LOW (ref >60.00)
Glucose, Bld: 102 mg/dL — ABNORMAL HIGH (ref 70–99)
Potassium: 4.1 meq/L (ref 3.5–5.1)
Sodium: 134 meq/L — ABNORMAL LOW (ref 135–145)
Total Bilirubin: 0.4 mg/dL (ref 0.2–1.2)
Total Protein: 6.2 g/dL (ref 6.0–8.3)

## 2024-07-31 LAB — CBC WITH DIFFERENTIAL/PLATELET
Basophils Absolute: 0.1 K/uL (ref 0.0–0.1)
Basophils Relative: 0.9 % (ref 0.0–3.0)
Eosinophils Absolute: 0.6 K/uL (ref 0.0–0.7)
Eosinophils Relative: 7 % — ABNORMAL HIGH (ref 0.0–5.0)
HCT: 38.8 % (ref 36.0–46.0)
Hemoglobin: 13.1 g/dL (ref 12.0–15.0)
Lymphocytes Relative: 16.8 % (ref 12.0–46.0)
Lymphs Abs: 1.4 K/uL (ref 0.7–4.0)
MCHC: 33.7 g/dL (ref 30.0–36.0)
MCV: 91.9 fl (ref 78.0–100.0)
Monocytes Absolute: 0.7 K/uL (ref 0.1–1.0)
Monocytes Relative: 8.3 % (ref 3.0–12.0)
Neutro Abs: 5.6 K/uL (ref 1.4–7.7)
Neutrophils Relative %: 67 % (ref 43.0–77.0)
Platelets: 286 K/uL (ref 150.0–400.0)
RBC: 4.22 Mil/uL (ref 3.87–5.11)
RDW: 13.6 % (ref 11.5–15.5)
WBC: 8.4 K/uL (ref 4.0–10.5)

## 2024-07-31 LAB — TSH: TSH: 36.1 u[IU]/mL — ABNORMAL HIGH (ref 0.35–5.50)

## 2024-07-31 LAB — LIPID PANEL
Cholesterol: 112 mg/dL (ref 0–200)
HDL: 54.6 mg/dL (ref >39.00)
LDL Cholesterol: 29 mg/dL (ref 0–99)
NonHDL: 57
Total CHOL/HDL Ratio: 2
Triglycerides: 139 mg/dL (ref 0.0–149.0)
VLDL: 27.8 mg/dL (ref 0.0–40.0)

## 2024-07-31 LAB — URINE CULTURE
MICRO NUMBER:: 16845633
SPECIMEN QUALITY:: ADEQUATE

## 2024-07-31 LAB — HEMOGLOBIN A1C: Hgb A1c MFr Bld: 5.8 % (ref 4.6–6.5)

## 2024-07-31 NOTE — Progress Notes (Signed)
 Subjective:    Patient ID: Jackie Stevens, female    DOB: 1946-01-02, 78 y.o.   MRN: [id number]  Chief Complaint  Patient presents with   6 month follow up    HPI Discussed the use of AI scribe software for clinical note transcription with the patient, who gave verbal consent to proceed.  History of Present Illness Jackie Stevens is a 78 year old female who presents with increased confusion and nighttime agitation.  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 An incident occurred where she was found in her walk-in closet with a cut on her hand, possibly from sliding down the door.  There have been episodes of her attempting to leave the house at night, such as opening the garage door and leaving her walker outside. Her caregiver has taken measures to prevent her from wandering, such as placing chairs in front of doors. She has also been found trying to wave down cars for help during the day, indicating confusion.  She gets up frequently at night to use the bathroom, approximately two to three times. No specific urinary symptoms are reported, but frequent nighttime urination is noted.  Her caregiver reports that she becomes more agitated when left alone, particularly on Sundays when he is at work, although his fiance has been helping by spending time with her. She does not like being alone, which contributes to her agitation.    Past Medical History:  Diagnosis Date   Abnormal cervical cytology 10/25/2012   Follows with Dr Devere Mayer of Gyn   Allergic rhinitis 11/04/2010   Qualifier: Diagnosis of  By: Neysa MD, Clinton D    Anemia 10/06/2013   Anginal pain    pt has history of CP states had cardiac workup with no specific issues identified    Arthritis    knees; right thumb; shoulders    Arthritis of left shoulder region 09/30/2016   X-ray of the left shoulder on 09/14/2016: Marked degenerative change with significant osteophytic spurring and subchondral sclerosis.  Loss of glenohumeral joint space.  Well-maintained subacromial space.  Injected 09/30/2016 GL[facility] Sharps .   Asthma, mild intermittent 04/02/2013   Chicken pox as a child   Colonic diverticular abscess 11/21/2012   Constipation    Decreased hearing    Dermatitis 03/06/2017   Diverticulitis 10/25/2012   pt. reports that a drain was placed - 09/2012     Diverticulitis of rectosigmoid 11/28/2012   Dry mouth    Essential hypertension 11/08/2010   Qualifier: Diagnosis of  By: Neysa MD, Clinton D    Excessive thirst    External hemorrhoid, bleeding    sometimes (Jan 01, 2013)   Frequent urination    GERD (gastroesophageal reflux disease)    H/O hiatal hernia    Hand tingling    Heart murmur    History of kidney stones    Hyperglycemia 01/17/2020   Hyperlipidemia    Hypothyroidism    Incontinence    Insomnia    Kidney stones 1970's   passed on their own (01-01-2013)   Low back pain 06/09/2014   Measles as a child   Mild neurocognitive disorder 02/05/2020   Obesity 09/11/2017   Obstructive sleep apnea 11/04/2010   NPSG [facility] 07/15/10- AHI 13.5/ hr CPAP 10/APS     Otitis media 01/07/2014   Palpitations  Paresthesia 03/23/2015   Left face   PONV (postoperative nausea and vomiting)    Rheumatoid arteritis    Shortness of breath dyspnea    using stairs   Sinus pain    Swelling of both lower extremities    Thyroid  cancer 1980's   Tinnitus    UTI (urinary tract infection) 04/02/2013   Vertigo 08/05/2019   Vitamin D  deficiency 02/28/2018    Past Surgical History:  Procedure Laterality Date   CHOLECYSTECTOMY  1990   COLON SURGERY     COLOSTOMY REVISION  12/19/2012   Procedure: COLON RESECTION SIGMOID;  Surgeon: Lynwood MALVA Pina, MD;  Location: [facility];  Service: General;  Laterality: N/A;   CYSTOSCOPY WITH STENT PLACEMENT   12/19/2012   Procedure: CYSTOSCOPY WITH STENT PLACEMENT;  Surgeon: Thomasine Oiler, MD;  Location: [facility];  Service: Urology;  Laterality: N/A;   DILATION AND CURETTAGE OF UTERUS  1960's   lots of them; had miscarriages (12/19/2012)   LYSIS OF ADHESION N/A 12/02/2015   Procedure: LAPAROSCOPIC LYSIS OF ADHESION;  Surgeon: Donnice Lunger, MD;  Location: [facility];  Service: General;  Laterality: N/A;   ROBOTIC ASSISTED BILATERAL SALPINGO OOPHERECTOMY Bilateral 12/02/2015   Procedure: XI ROBOTIC ASSISTED BILATERAL SALPINGO OOPHORECTOMY;  Surgeon: Maurilio Ship, MD;  Location: [facility];  Service: Gynecology;  Laterality: Bilateral;   SIGMOID RESECTION / RECTOPEXY  12/19/2012   THYROIDECTOMY, PARTIAL  1988   then did iodine  to remove the rest (12/19/2012)   TONSILLECTOMY  1951?   TOTAL KNEE ARTHROPLASTY Left 01/19/2021   Procedure: TOTAL KNEE ARTHROPLASTY;  Surgeon: Melodi Lerner, MD;  Location: [facility];  Service: Orthopedics;  Laterality: Left;    TRANSRECTAL DRAINAGE OF PELVIC ABSCESS  10/27/2012   VAGINAL HYSTERECTOMY  1970's   still have my ovaries (12/19/2012)    Family History  Problem Relation Age of Onset   Alzheimer's disease Mother    Heart disease Mother    Depression Mother    Heart disease Father    Pneumonia Father    Hypertension Father    Hyperlipidemia Father    Cancer Father        skin   Stroke Father    Emphysema Brother        marijuana and cigarettes   Alcohol  abuse Brother    Hearing loss Brother    Diabetes Maternal Grandmother    Alzheimer's disease Paternal Grandmother    Cancer Paternal Grandmother        lung?- smoker   Hyperlipidemia Paternal Grandmother    Heart attack Paternal Grandfather    Alcohol  abuse Paternal Grandfather    Neurofibromatosis Son        schwanomatosis   Neurofibromatosis Son        swanomatosis   Cancer Paternal Aunt    Esophageal cancer Neg Hx    Colon cancer Neg Hx    Stomach cancer Neg Hx    Colon polyps Neg Hx     Social  History   Socioeconomic History   Marital status: Widowed    Spouse name: Renae Mottley   Number of children: 2   Years of education: 12   Highest education level: Some college, no degree  Occupational History   Occupation: Retired  Tobacco Use   Smoking status: Never    Passive exposure: Never   Smokeless tobacco: Never   Tobacco comments:    Verified by Gilmer Jeralyn Munroe  Vaping Use   Vaping status: Never Used  Substance and Sexual  Activity   Alcohol  use: No    Alcohol /week: 0.0 standard drinks of alcohol    Drug use: No   Sexual activity: Not Currently  Other Topics Concern   Not on file  Social History Narrative   Married - (husband passed )   Children   Right handed   Some college   Retired   3 sons   Social Drivers of Corporate investment banker Strain: Low Risk  (01/10/2024)   Overall Financial Resource Strain (CARDIA)    Difficulty of Paying Living Expenses: Not hard at all  Food Insecurity: No Food Insecurity (01/10/2024)   Hunger Vital Sign    Worried About Running Out of Food in the Last Year: Never true    Ran Out of Food in the Last Year: Never true  Transportation Needs: No Transportation Needs (01/10/2024)   PRAPARE - Administrator, Civil Service (Medical): No    Lack of Transportation (Non-Medical): No  Physical Activity: Sufficiently Active (01/10/2024)   Exercise Vital Sign    Days of Exercise per Week: 7 days    Minutes of Exercise per Session: 30 min  Stress: No Stress Concern Present (01/10/2024)   [employer] of Occupational Health - Occupational Stress Questionnaire    Feeling of Stress : Not at all  Social Connections: Moderately Integrated (01/10/2024)   Social Connection and Isolation Panel    Frequency of Communication with Friends and Family: More than three times a week    Frequency of Social Gatherings with Friends and Family: More than three times a week    Attends Religious Services: More than 4 times per year    Active  Member of [employer] or Organizations: Yes    Attends Banker Meetings: More than 4 times per year    Marital Status: Widowed  Intimate Partner Violence: Not At Risk (01/10/2024)   Humiliation, Afraid, Rape, and Kick questionnaire    Fear of Current or Ex-Partner: No    Emotionally Abused: No    Physically Abused: No    Sexually Abused: No    Outpatient Medications Prior to Visit  Medication Sig Dispense Refill   albuterol  (VENTOLIN  HFA) 108 (90 Base) MCG/ACT inhaler TAKE 2 PUFFS BY MOUTH EVERY 6 HOURS AS NEEDED FOR WHEEZE OR SHORTNESS OF BREATH 18 each 2   amLODipine  (NORVASC ) 5 MG tablet Take 1 tablet (5 mg total) by mouth daily. 90 tablet 0   Azelastine  HCl 137 MCG/SPRAY SOLN Place 2 sprays into both nostrils in the morning and at bedtime. 90 mL 1   benzonatate  (TESSALON  PERLES) 100 MG capsule Take 1 capsule (100 mg total) by mouth 3 (three) times daily as needed for cough. 30 capsule 2   Black Pepper-Turmeric (TURMERIC CURCUMIN) 04-999 MG CAPS 1 tablet daily.     carboxymethylcellulose (REFRESH PLUS) 0.5 % SOLN Place 1 drop into both eyes 3 (three) times daily as needed (dry eyes).     Cholecalciferol-Vitamin C (VITAMIN D3-VITAMIN C) 1000-500 UNIT-MG CAPS daily.     docusate sodium  (COLACE) 100 MG capsule Take 100 mg by mouth daily.     donepezil  (ARICEPT ) 10 MG tablet TAKE 1 TABLET BY MOUTH EVERY DAY 90 tablet 3   famotidine  (PEPCID ) 20 MG tablet TAKE 2 TABLETS (40 MG TOTAL) BY MOUTH DAILY. 180 tablet 1   FLUoxetine  (PROZAC ) 10 MG tablet TAKE 1 TABLET BY MOUTH EVERY DAY 90 tablet 1   gabapentin  (NEURONTIN ) 300 MG capsule TAKE 1 CAPSULE BY MOUTH EVERYDAY  AT BEDTIME 30 capsule 1   hydrochlorothiazide  (HYDRODIURIL ) 25 MG tablet TAKE 1 TABLET (25 MG TOTAL) BY MOUTH DAILY. 90 tablet 2   levothyroxine  (SYNTHROID ) 125 MCG tablet TAKE 1 TABLET BY MOUTH EVERY DAY BEFORE BREAKFAST 90 tablet 1   losartan  (COZAAR ) 100 MG tablet TAKE 1 TABLET BY MOUTH EVERYDAY AT BEDTIME 90 tablet 2    memantine  (NAMENDA ) 10 MG tablet Take 1 tablet (10 mg total) by mouth 2 (two) times daily. 180 tablet 2   metoprolol  tartrate (LOPRESSOR ) 100 MG tablet TAKE 1.5 TABLETS BY MOUTH 2 TIMES DAILY. 270 tablet 1   naproxen  (NAPROSYN ) 375 MG tablet Take 1 tablet (375 mg total) by mouth 2 (two) times daily with a meal. 60 tablet 3   nitroGLYCERIN  (NITROSTAT ) 0.4 MG SL tablet PLACE 1 TABLET UNDER THE TONGUE EVERY 5 MINUTES AS NEEDED FOR CHEST PAIN 25 tablet 1   omeprazole  (PRILOSEC ) 40 MG capsule TAKE 1 CAPSULE BY MOUTH TWICE A DAY 180 capsule 1   ondansetron  (ZOFRAN ) 4 MG tablet Take 1 tablet (4 mg total) by mouth every 8 (eight) hours as needed for nausea or vomiting. 30 tablet 1   polyethylene glycol (MIRALAX  / GLYCOLAX ) 17 g packet Take 17 g by mouth daily.     Psyllium-Calcium  (METAMUCIL PLUS CALCIUM ) CAPS daily.     rosuvastatin  (CRESTOR ) 40 MG tablet Take 1 tablet (40 mg total) by mouth daily. 90 tablet 1   sulfamethoxazole -trimethoprim  (BACTRIM  DS) 800-160 MG tablet Take 1 tablet by mouth 2 (two) times daily. 14 tablet 0   No facility-administered medications prior to visit.    Allergies  Allergen Reactions   Neomycin -Bacitracin Zn-Polymyx Rash    Polysporin- is tolerated    Niacin Other (See Comments) and Cough    cough til I threw up (12/19/2012)   Ciprofloxacin  Hives    Got cipro  and flagyl  at same time, localized hives to IV arm   Flagyl  [Metronidazole ] Hives    Got cipro  and flagyl  at same time, localized hives to IV arm   Neomycin  Other (See Comments)    Review of Systems  Constitutional:  Positive for malaise/fatigue. Negative for fever.  HENT:  Negative for congestion.   Eyes:  Negative for blurred vision.  Respiratory:  Negative for shortness of breath.   Cardiovascular:  Negative for chest pain, palpitations and leg swelling.  Gastrointestinal:  Negative for abdominal pain, blood in stool and nausea.  Genitourinary:  Negative for dysuria and frequency.  Musculoskeletal:   Negative for falls.  Skin:  Negative for rash.  Neurological:  Negative for dizziness, loss of consciousness and headaches.  Endo/Heme/Allergies:  Negative for environmental allergies.  Psychiatric/Behavioral:  Positive for memory loss. Negative for depression. The patient is not nervous/anxious.        Objective:    Physical Exam Constitutional:      General: She is not in acute distress.    Appearance: Normal appearance. She is well-developed. She is not toxic-appearing.  HENT:     Head: Normocephalic and atraumatic.     Right Ear: External ear normal.     Left Ear: External ear normal.     Nose: Nose normal.  Eyes:     General:        Right eye: No discharge.        Left eye: No discharge.     Conjunctiva/sclera: Conjunctivae normal.  Neck:     Thyroid : No thyromegaly.  Cardiovascular:     Rate and Rhythm: Normal rate  and regular rhythm.     Heart sounds: Normal heart sounds. No murmur heard. Pulmonary:     Effort: Pulmonary effort is normal. No respiratory distress.     Breath sounds: Normal breath sounds.  Abdominal:     General: Bowel sounds are normal.     Palpations: Abdomen is soft.     Tenderness: There is no abdominal tenderness. There is no guarding.  Musculoskeletal:        General: Normal range of motion.     Cervical back: Neck supple.  Lymphadenopathy:     Cervical: No cervical adenopathy.  Skin:    General: Skin is warm and dry.  Neurological:     Mental Status: She is alert and oriented to person, place, and time.  Psychiatric:        Mood and Affect: Mood normal.        Behavior: Behavior normal.        Thought Content: Thought content normal.        Judgment: Judgment normal.     BP 118/78 (BP Location: Right Arm, Patient Position: Sitting, Cuff Size: Normal)   Pulse 60   Temp 98.2 F (36.8 C) (Oral)   Resp 12   Ht 5' 4 (1.626 m)   Wt 195 lb 6.4 oz (88.6 kg)   SpO2 97%   BMI 33.54 kg/m  Wt Readings from Last 3 Encounters:  07/30/24  195 lb 6.4 oz (88.6 kg)  06/11/24 199 lb (90.3 kg)  06/11/24 199 lb (90.3 kg)    Diabetic Foot Exam - Simple   No data filed    Lab Results  Component Value Date   WBC 8.4 07/30/2024   HGB 13.1 07/30/2024   HCT 38.8 07/30/2024   PLT 286.0 07/30/2024   GLUCOSE 102 (H) 07/30/2024   CHOL 112 07/30/2024   TRIG 139.0 07/30/2024   HDL 54.60 07/30/2024   LDLDIRECT 81.0 02/12/2020   LDLCALC 29 07/30/2024   ALT 22 07/30/2024   AST 23 07/30/2024   NA 134 (L) 07/30/2024   K 4.1 07/30/2024   CL 93 (L) 07/30/2024   CREATININE 1.08 07/30/2024   BUN 23 07/30/2024   CO2 32 07/30/2024   TSH 36.10 (H) 07/30/2024   INR 1.0 01/12/2021   HGBA1C 5.8 07/30/2024    Lab Results  Component Value Date   TSH 36.10 (H) 07/30/2024   Lab Results  Component Value Date   WBC 8.4 07/30/2024   HGB 13.1 07/30/2024   HCT 38.8 07/30/2024   MCV 91.9 07/30/2024   PLT 286.0 07/30/2024   Lab Results  Component Value Date   NA 134 (L) 07/30/2024   K 4.1 07/30/2024   CO2 32 07/30/2024   GLUCOSE 102 (H) 07/30/2024   BUN 23 07/30/2024   CREATININE 1.08 07/30/2024   BILITOT 0.4 07/30/2024   ALKPHOS 71 07/30/2024   AST 23 07/30/2024   ALT 22 07/30/2024   PROT 6.2 07/30/2024   ALBUMIN  4.2 07/30/2024   CALCIUM  8.6 07/30/2024   ANIONGAP 10 05/09/2024   GFR 48.70 (L) 07/30/2024   Lab Results  Component Value Date   CHOL 112 07/30/2024   Lab Results  Component Value Date   HDL 54.60 07/30/2024   Lab Results  Component Value Date   LDLCALC 29 07/30/2024   Lab Results  Component Value Date   TRIG 139.0 07/30/2024   Lab Results  Component Value Date   CHOLHDL 2 07/30/2024   Lab Results  Component Value Date  HGBA1C 5.8 07/30/2024       Assessment & Plan:  Dementia with anxiety, unspecified dementia severity, unspecified dementia type [facility]) Assessment & Plan: Doing well at home with her husband   Hyperglycemia Assessment & Plan: hgba1c acceptable, minimize simple carbs.  Increase exercise as tolerated.   Orders: -     Hemoglobin A1c  Hyperlipidemia, mixed Assessment & Plan: Encourage heart healthy diet such as MIND or DASH diet, increase exercise, avoid trans fats, simple carbohydrates and processed foods, consider a krill or fish or flaxseed oil cap daily. Tolerating Rosuvastatin   Orders: -     Lipid panel  Other specified hypothyroidism Assessment & Plan: On Levothyroxine  but TSH elevated. Increase Levothyroxine  dose if patient is taking dose daily   Essential hypertension Assessment & Plan: Well controlled, no changes to meds. Encouraged heart healthy diet such as the DASH diet and exercise as tolerated.    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 Increased confusion and episodes of sundowning, including an incident where she was found in her closet at night. She has been more tired and eating less. Concern for potential urinary tract infection contributing to symptoms, as UTIs can exacerbate confusion and sundowning. - Order urinalysis to check for urinary tract infection. - Consider starting low-dose Haldol  at night if urinalysis is negative for infection. - Monitor for any adverse effects of medication and adjust dosage if necessary. - Discuss the use of deterrents like bells on doors to prevent wandering at night.  Recording duration: 11 minutes     Harlene Horton, MD

## 2024-08-01 ENCOUNTER — Other Ambulatory Visit: Payer: Self-pay | Admitting: Family Medicine

## 2024-08-01 ENCOUNTER — Telehealth: Payer: Self-pay | Admitting: *Deleted

## 2024-08-01 MED ORDER — HALOPERIDOL 1 MG PO TABS: 1.0000 mg | ORAL_TABLET | Freq: Every day | ORAL | 2 refills | Status: DC

## 2024-08-01 NOTE — Telephone Encounter (Signed)
 From Dr. Domenica I had discussed with her son starting a low dose of Haldol  for her sun downing at night if she did not have a UTI please check with him make sure he still wants to proceed and see what pharmacy he wants me to use.

## 2024-08-01 NOTE — Telephone Encounter (Signed)
 Spoke with son and he would like to proceed.  Send to [facility].

## 2024-08-03 NOTE — Telephone Encounter (Signed)
 Left message for son to call back about levothyroxine  dose or to send us  a mychart.

## 2024-08-04 ENCOUNTER — Other Ambulatory Visit: Payer: Self-pay | Admitting: Family Medicine

## 2024-08-04 DIAGNOSIS — E038 Other specified hypothyroidism: Secondary | ICD-10-CM

## 2024-08-08 ENCOUNTER — Other Ambulatory Visit: Payer: Self-pay | Admitting: Family Medicine

## 2024-08-08 DIAGNOSIS — I10 Essential (primary) hypertension: Secondary | ICD-10-CM

## 2024-08-10 ENCOUNTER — Other Ambulatory Visit: Payer: Self-pay | Admitting: Orthopaedic Surgery

## 2024-08-16 ENCOUNTER — Encounter: Payer: Self-pay | Admitting: Family Medicine

## 2024-08-21 DIAGNOSIS — Z008 Encounter for other general examination: Secondary | ICD-10-CM | POA: Diagnosis not present

## 2024-08-22 ENCOUNTER — Telehealth: Payer: Self-pay

## 2024-08-22 NOTE — Telephone Encounter (Signed)
 Copied from CRM [id number]. Topic: General - Other >> Aug 22, 2024 12:58 PM Drema MATSU wrote: Reason for CRM: Patient had an in home assessment yesterday and patient was given a PAD test and her results came back abnormal. She said that the measurements on left was 0.85 and 0.97 for the right. Lauraine wanted to let pcp know so that patient can be scheduled for a follow up visit. She will be mailing  it to the office and it 7-10 business days for clinic to receive.

## 2024-08-23 NOTE — Telephone Encounter (Signed)
 Patients son Marshal) was advised and would like to move forward with referral.

## 2024-08-25 ENCOUNTER — Other Ambulatory Visit: Payer: Self-pay | Admitting: Family

## 2024-09-07 NOTE — Progress Notes (Signed)
   LILLETTE Ileana Collet, PhD, LAT, ATC acting as a scribe for Artist Lloyd, MD.  Jackie Stevens is a 78 y.o. female who presents to [facility] today for 46-month f/u L shoulder pain. Pt was last seen by Dr. Lloyd on 06/11/24 and was given a repeat L GH steroid injection.  Today, pt reports shoulder pain returned slowly, only c/o pain intermittently.   Dx imaging: 12/27/23 L shoulder XR             05/27/22 L shoulder XR  Pertinent review of systems: No fevers or chills  Relevant historical information: Hypertension and sleep apnea.  Dementia   Exam:  BP (!) 164/76   Pulse 76   Ht 5' 4 (1.626 m)   Wt 189 lb (85.7 kg)   SpO2 97%   BMI 32.44 kg/m  General: Well Developed, well nourished, and in no acute distress.   MSK: Left shoulder decreased range of motion.  Intact strength.    Lab and Radiology Results  Procedure: Real-time Ultrasound Guided Injection of left shoulder glenohumeral joint posterior approach Device: Philips Affiniti 50G/GE Logiq Images permanently stored and available for review in PACS Verbal informed consent obtained.  Discussed risks and benefits of procedure. Warned about infection, bleeding, hyperglycemia damage to structures among others. Patient expresses understanding and agreement Time-out conducted.   Noted no overlying erythema, induration, or other signs of local infection.   Skin prepped in a sterile fashion.   Local anesthesia: Topical Ethyl chloride.   With sterile technique and under real time ultrasound guidance: 40 mg of Kenalog  and 2 mL of Marcaine  injected into glenohumeral joint. Fluid seen entering the joint capsule.   Completed without difficulty   Pain immediately resolved suggesting accurate placement of the medication.   Advised to call if fevers/chills, erythema, induration, drainage, or persistent bleeding.   Images permanently stored and available for review in the ultrasound unit.  Impression:  Technically successful ultrasound guided injection.         Assessment and Plan: 78 y.o. female with chronic left shoulder pain.  Plan for steroid injection today.  This is a repeat injection.  Check back as needed.  Can repeat this injection every 3 months if needed.   PDMP not reviewed this encounter. Orders Placed This Encounter  Procedures   US  LIMITED JOINT SPACE STRUCTURES UP LEFT(NO LINKED CHARGES)    Reason for Exam (SYMPTOM  OR DIAGNOSIS REQUIRED):   left shoulder pain    Preferred imaging location?:   [facility]   No orders of the defined types were placed in this encounter.    Discussed warning signs or symptoms. Please see discharge instructions. Patient expresses understanding.   The above documentation has been reviewed and is accurate and complete Artist Lloyd, M.D.

## 2024-09-10 ENCOUNTER — Other Ambulatory Visit: Payer: Self-pay

## 2024-09-10 ENCOUNTER — Ambulatory Visit: Admitting: Family Medicine

## 2024-09-10 VITALS — BP 164/76 | HR 76 | Ht 64.0 in | Wt 189.0 lb

## 2024-09-10 DIAGNOSIS — M25512 Pain in left shoulder: Secondary | ICD-10-CM

## 2024-09-10 DIAGNOSIS — G8929 Other chronic pain: Secondary | ICD-10-CM

## 2024-09-10 NOTE — Patient Instructions (Addendum)
 Thank you for coming in today.   You received an injection today. Seek immediate medical attention if the joint becomes red, extremely painful, or is oozing fluid.   I can repeat this shot in 3 months, if needed.

## 2024-09-25 DIAGNOSIS — C73 Malignant neoplasm of thyroid gland: Secondary | ICD-10-CM | POA: Diagnosis not present

## 2024-09-25 DIAGNOSIS — E785 Hyperlipidemia, unspecified: Secondary | ICD-10-CM | POA: Diagnosis not present

## 2024-09-25 DIAGNOSIS — I10 Essential (primary) hypertension: Secondary | ICD-10-CM | POA: Diagnosis not present

## 2024-09-25 DIAGNOSIS — R7302 Impaired glucose tolerance (oral): Secondary | ICD-10-CM | POA: Diagnosis not present

## 2024-09-25 DIAGNOSIS — E890 Postprocedural hypothyroidism: Secondary | ICD-10-CM | POA: Diagnosis not present

## 2024-10-07 ENCOUNTER — Other Ambulatory Visit: Payer: Self-pay | Admitting: Orthopaedic Surgery

## 2024-10-15 ENCOUNTER — Encounter: Payer: Self-pay | Admitting: Radiology

## 2024-10-15 DIAGNOSIS — Z1231 Encounter for screening mammogram for malignant neoplasm of breast: Secondary | ICD-10-CM | POA: Diagnosis not present

## 2024-10-15 LAB — HM MAMMOGRAPHY

## 2024-10-19 ENCOUNTER — Encounter: Payer: Self-pay | Admitting: Family Medicine

## 2024-11-06 ENCOUNTER — Other Ambulatory Visit: Payer: Self-pay | Admitting: Family Medicine

## 2024-11-22 ENCOUNTER — Other Ambulatory Visit: Payer: Self-pay | Admitting: Family Medicine

## 2024-11-22 DIAGNOSIS — K219 Gastro-esophageal reflux disease without esophagitis: Secondary | ICD-10-CM

## 2024-11-22 DIAGNOSIS — R059 Cough, unspecified: Secondary | ICD-10-CM

## 2024-11-22 DIAGNOSIS — J302 Other seasonal allergic rhinitis: Secondary | ICD-10-CM

## 2024-11-26 ENCOUNTER — Other Ambulatory Visit (HOSPITAL_COMMUNITY): Payer: Self-pay

## 2024-11-26 ENCOUNTER — Encounter: Payer: Self-pay | Admitting: Family Medicine

## 2024-11-26 MED ORDER — NAPROXEN 375 MG PO TABS: 375.0000 mg | ORAL_TABLET | Freq: Two times a day (BID) | ORAL | 3 refills | Status: AC

## 2024-11-30 ENCOUNTER — Other Ambulatory Visit: Payer: Self-pay | Admitting: Family Medicine

## 2024-12-08 ENCOUNTER — Other Ambulatory Visit: Payer: Self-pay | Admitting: Physician Assistant

## 2024-12-10 ENCOUNTER — Ambulatory Visit: Admitting: Family Medicine

## 2024-12-21 NOTE — Progress Notes (Unsigned)
"       ° °  LILLETTE Ileana Collet, PhD, LAT, ATC acting as a scribe for Artist Lloyd, MD.  Jackie Stevens is a 79 y.o. female who presents to [facility] today for 11-month f/u L shoulder pain. Pt was last seen by Dr. Lloyd on 09/10/24 and was given a repeat L GH steroid injection.  Today, pt reports L shoulder pain returned around Christmas. She would like a repeat steroid injection today.  Pt also notes L hip pain.  It is mostly located in the left low back and extend into the buttocks.  Not much pain radiating to the lateral hip.  Prior lumbar spine x-ray just shows considerable degenerative changes.  Left hip x-ray does not show much DJD.  Dx imaging: 12/27/23 L shoulder XR             05/27/22 L shoulder XR  Pertinent review of systems: No fevers or chills  Relevant historical information: Left shoulder glenohumeral DJD.  Dementia.   Exam:  BP 136/84   Pulse 60   Ht 5' 4 (1.626 m)   Wt 186 lb (84.4 kg)   SpO2 96%   BMI 31.93 kg/m  General: Well Developed, well nourished, and in no acute distress.   MSK: Left shoulder decreased range of motion.  Pain with abduction.  L-spine nontender to palpation spinal midline.  Mild to palpation left lumbar paraspinal musculature.    Lab and Radiology Results  Procedure: Real-time Ultrasound Guided Injection of left shoulder glenohumeral joint posterior approach Device: Philips Affiniti 50G/GE Logiq Images permanently stored and available for review in PACS Verbal informed consent obtained.  Discussed risks and benefits of procedure. Warned about infection, bleeding, hyperglycemia damage to structures among others. Patient expresses understanding and agreement Time-out conducted.   Noted no overlying erythema, induration, or other signs of local infection.   Skin prepped in a sterile fashion.   Local anesthesia: Topical Ethyl chloride.   With sterile technique and under real time ultrasound guidance: 40 mg of Kenalog   and 2 mL of Marcaine  injected into glenohumeral joint. Fluid seen entering the joint capsule.   Completed without difficulty   Pain immediately resolved suggesting accurate placement of the medication.   Advised to call if fevers/chills, erythema, induration, drainage, or persistent bleeding.   Images permanently stored and available for review in the ultrasound unit.  Impression: Technically successful ultrasound guided injection.         Assessment and Plan: 79 y.o. female with left shoulder pain due to significant glenohumeral DJD.  Plan for steroid injection today.  Recheck in about 3 months.  Left hip pain is more related to low back and degenerative changes.  Watchful waiting.   PDMP not reviewed this encounter. Orders Placed This Encounter  Procedures   US  LIMITED JOINT SPACE STRUCTURES UP LEFT(NO LINKED CHARGES)    Reason for Exam (SYMPTOM  OR DIAGNOSIS REQUIRED):   left shoulder pain    Preferred imaging location?:   [facility]   No orders of the defined types were placed in this encounter.    Discussed warning signs or symptoms. Please see discharge instructions. Patient expresses understanding.   The above documentation has been reviewed and is accurate and complete Artist Lloyd, M.D.   "

## 2024-12-24 ENCOUNTER — Other Ambulatory Visit: Payer: Self-pay

## 2024-12-24 ENCOUNTER — Ambulatory Visit: Admitting: Family Medicine

## 2024-12-24 VITALS — BP 136/84 | HR 60 | Ht 64.0 in | Wt 186.0 lb

## 2024-12-24 DIAGNOSIS — M19012 Primary osteoarthritis, left shoulder: Secondary | ICD-10-CM

## 2024-12-24 DIAGNOSIS — M25552 Pain in left hip: Secondary | ICD-10-CM

## 2024-12-24 DIAGNOSIS — M25512 Pain in left shoulder: Secondary | ICD-10-CM | POA: Diagnosis not present

## 2024-12-24 DIAGNOSIS — G8929 Other chronic pain: Secondary | ICD-10-CM

## 2024-12-24 NOTE — Patient Instructions (Signed)
Thank you for coming in today.   You received an injection today. Seek immediate medical attention if the joint becomes red, extremely painful, or is oozing fluid.   Check back in 3 months 

## 2024-12-31 ENCOUNTER — Encounter: Admitting: Family Medicine

## 2025-01-10 ENCOUNTER — Telehealth: Payer: Self-pay | Admitting: Family Medicine

## 2025-01-10 NOTE — Telephone Encounter (Signed)
 Copied from CRM #[id number]. Topic: Medicare AWV >> Jan 10, 2025  2:02 PM Nathanel DEL wrote: Temporary telehealth appts allowances ended, so your 2026 wellness visit must be completed in person starting January 1. We've updated your appointment accordingly. Please reach out if you need a different day or time. Thanks.  Nathanel Paschal; Care Guide Ambulatory Clinical Support [place] [facility] Direct Dial: [phone]

## 2025-01-15 ENCOUNTER — Ambulatory Visit: Payer: Medicare HMO

## 2025-01-15 VITALS — BP 132/62 | HR 60 | Temp 98.0°F | Ht 64.0 in | Wt 191.6 lb

## 2025-01-15 DIAGNOSIS — Z0000 Encounter for general adult medical examination without abnormal findings: Secondary | ICD-10-CM

## 2025-01-15 DIAGNOSIS — E038 Other specified hypothyroidism: Secondary | ICD-10-CM

## 2025-03-25 ENCOUNTER — Ambulatory Visit: Admitting: Family Medicine

## 2025-07-18 ENCOUNTER — Encounter: Admitting: Family Medicine

## 2026-01-21 ENCOUNTER — Ambulatory Visit
